# Patient Record
Sex: Female | Born: 1950
Health system: Southern US, Community
[De-identification: ages and names within clinical notes are randomized; demographics above are authoritative.]

## PROBLEM LIST (undated history)

## (undated) DIAGNOSIS — M1990 Unspecified osteoarthritis, unspecified site: Secondary | ICD-10-CM

## (undated) DIAGNOSIS — E559 Vitamin D deficiency, unspecified: Secondary | ICD-10-CM

## (undated) DIAGNOSIS — R51 Headache: Secondary | ICD-10-CM

## (undated) DIAGNOSIS — I739 Peripheral vascular disease, unspecified: Secondary | ICD-10-CM

## (undated) DIAGNOSIS — K219 Gastro-esophageal reflux disease without esophagitis: Secondary | ICD-10-CM

## (undated) DIAGNOSIS — E669 Obesity, unspecified: Secondary | ICD-10-CM

## (undated) DIAGNOSIS — G4730 Sleep apnea, unspecified: Secondary | ICD-10-CM

## (undated) DIAGNOSIS — K5900 Constipation, unspecified: Secondary | ICD-10-CM

## (undated) DIAGNOSIS — I2510 Atherosclerotic heart disease of native coronary artery without angina pectoris: Secondary | ICD-10-CM

## (undated) DIAGNOSIS — E1142 Type 2 diabetes mellitus with diabetic polyneuropathy: Secondary | ICD-10-CM

## (undated) DIAGNOSIS — J449 Chronic obstructive pulmonary disease, unspecified: Secondary | ICD-10-CM

## (undated) DIAGNOSIS — I509 Heart failure, unspecified: Secondary | ICD-10-CM

## (undated) DIAGNOSIS — Z7982 Long term (current) use of aspirin: Secondary | ICD-10-CM

## (undated) DIAGNOSIS — R0602 Shortness of breath: Secondary | ICD-10-CM

## (undated) DIAGNOSIS — E039 Hypothyroidism, unspecified: Secondary | ICD-10-CM

## (undated) DIAGNOSIS — I700 Atherosclerosis of aorta: Secondary | ICD-10-CM

## (undated) DIAGNOSIS — R413 Other amnesia: Secondary | ICD-10-CM

## (undated) DIAGNOSIS — E119 Type 2 diabetes mellitus without complications: Secondary | ICD-10-CM

## (undated) DIAGNOSIS — K589 Irritable bowel syndrome without diarrhea: Secondary | ICD-10-CM

## (undated) DIAGNOSIS — M2550 Pain in unspecified joint: Secondary | ICD-10-CM

## (undated) DIAGNOSIS — F32A Depression, unspecified: Secondary | ICD-10-CM

## (undated) DIAGNOSIS — H9190 Unspecified hearing loss, unspecified ear: Secondary | ICD-10-CM

## (undated) DIAGNOSIS — Z8719 Personal history of other diseases of the digestive system: Secondary | ICD-10-CM

## (undated) DIAGNOSIS — G4700 Insomnia, unspecified: Secondary | ICD-10-CM

## (undated) DIAGNOSIS — D649 Anemia, unspecified: Secondary | ICD-10-CM

## (undated) DIAGNOSIS — T8859XA Other complications of anesthesia, initial encounter: Secondary | ICD-10-CM

## (undated) DIAGNOSIS — I5030 Unspecified diastolic (congestive) heart failure: Secondary | ICD-10-CM

## (undated) DIAGNOSIS — G4733 Obstructive sleep apnea (adult) (pediatric): Secondary | ICD-10-CM

## (undated) DIAGNOSIS — G5600 Carpal tunnel syndrome, unspecified upper limb: Secondary | ICD-10-CM

## (undated) DIAGNOSIS — M545 Low back pain, unspecified: Secondary | ICD-10-CM

## (undated) DIAGNOSIS — R0600 Dyspnea, unspecified: Secondary | ICD-10-CM

## (undated) DIAGNOSIS — M549 Dorsalgia, unspecified: Secondary | ICD-10-CM

## (undated) DIAGNOSIS — Z87442 Personal history of urinary calculi: Secondary | ICD-10-CM

## (undated) DIAGNOSIS — F329 Major depressive disorder, single episode, unspecified: Secondary | ICD-10-CM

## (undated) DIAGNOSIS — I10 Essential (primary) hypertension: Secondary | ICD-10-CM

## (undated) DIAGNOSIS — R519 Headache, unspecified: Secondary | ICD-10-CM

## (undated) DIAGNOSIS — E785 Hyperlipidemia, unspecified: Secondary | ICD-10-CM

## (undated) DIAGNOSIS — L719 Rosacea, unspecified: Secondary | ICD-10-CM

## (undated) DIAGNOSIS — I6783 Posterior reversible encephalopathy syndrome: Secondary | ICD-10-CM

## (undated) DIAGNOSIS — N189 Chronic kidney disease, unspecified: Secondary | ICD-10-CM

## (undated) DIAGNOSIS — M79673 Pain in unspecified foot: Secondary | ICD-10-CM

## (undated) DIAGNOSIS — K449 Diaphragmatic hernia without obstruction or gangrene: Secondary | ICD-10-CM

## (undated) DIAGNOSIS — E2839 Other primary ovarian failure: Secondary | ICD-10-CM

## (undated) DIAGNOSIS — E079 Disorder of thyroid, unspecified: Secondary | ICD-10-CM

## (undated) DIAGNOSIS — I6789 Other cerebrovascular disease: Secondary | ICD-10-CM

## (undated) DIAGNOSIS — E538 Deficiency of other specified B group vitamins: Secondary | ICD-10-CM

## (undated) DIAGNOSIS — J309 Allergic rhinitis, unspecified: Secondary | ICD-10-CM

## (undated) DIAGNOSIS — G609 Hereditary and idiopathic neuropathy, unspecified: Secondary | ICD-10-CM

## (undated) DIAGNOSIS — J189 Pneumonia, unspecified organism: Secondary | ICD-10-CM

## (undated) DIAGNOSIS — E11610 Type 2 diabetes mellitus with diabetic neuropathic arthropathy: Secondary | ICD-10-CM

## (undated) DIAGNOSIS — N184 Chronic kidney disease, stage 4 (severe): Secondary | ICD-10-CM

## (undated) DIAGNOSIS — R609 Edema, unspecified: Secondary | ICD-10-CM

## (undated) HISTORY — DX: Unspecified osteoarthritis, unspecified site: M19.90

## (undated) HISTORY — DX: Anemia, unspecified: D64.9

## (undated) HISTORY — DX: Low back pain, unspecified: M54.50

## (undated) HISTORY — PX: TUBAL LIGATION: SHX77

## (undated) HISTORY — DX: Pneumonia, unspecified organism: J18.9

## (undated) HISTORY — DX: Insomnia, unspecified: G47.00

## (undated) HISTORY — DX: Constipation, unspecified: K59.00

## (undated) HISTORY — DX: Rosacea, unspecified: L71.9

## (undated) HISTORY — PX: FOOT SURGERY: SHX648

## (undated) HISTORY — DX: Hyperlipidemia, unspecified: E78.5

## (undated) HISTORY — DX: Depression, unspecified: F32.A

## (undated) HISTORY — DX: Dyspnea, unspecified: R06.00

## (undated) HISTORY — DX: Sleep apnea, unspecified: G47.30

## (undated) HISTORY — DX: Allergic rhinitis, unspecified: J30.9

## (undated) HISTORY — DX: Essential (primary) hypertension: I10

## (undated) HISTORY — DX: Pain in unspecified joint: M25.50

## (undated) HISTORY — DX: Dorsalgia, unspecified: M54.9

## (undated) HISTORY — DX: Unspecified hearing loss, unspecified ear: H91.90

## (undated) HISTORY — DX: Personal history of other diseases of the digestive system: Z87.19

## (undated) HISTORY — DX: Other amnesia: R41.3

## (undated) HISTORY — DX: Headache, unspecified: R51.9

## (undated) HISTORY — DX: Edema, unspecified: R60.9

## (undated) HISTORY — PX: ANKLE SURGERY: SHX546

## (undated) HISTORY — DX: Vitamin D deficiency, unspecified: E55.9

## (undated) HISTORY — DX: Disorder of thyroid, unspecified: E07.9

## (undated) HISTORY — DX: Carpal tunnel syndrome, unspecified upper limb: G56.00

## (undated) HISTORY — DX: Hereditary and idiopathic neuropathy, unspecified: G60.9

## (undated) HISTORY — DX: Low back pain: M54.5

## (undated) HISTORY — DX: Other primary ovarian failure: E28.39

## (undated) HISTORY — DX: Gastro-esophageal reflux disease without esophagitis: K21.9

## (undated) HISTORY — DX: Shortness of breath: R06.02

## (undated) HISTORY — DX: Obesity, unspecified: E66.9

## (undated) HISTORY — PX: SPINE SURGERY: SHX786

## (undated) HISTORY — DX: Irritable bowel syndrome, unspecified: K58.9

## (undated) HISTORY — DX: Chronic kidney disease, unspecified: N18.9

## (undated) HISTORY — DX: Deficiency of other specified B group vitamins: E53.8

## (undated) HISTORY — DX: Headache: R51

## (undated) HISTORY — DX: Pain in unspecified foot: M79.673

## (undated) HISTORY — PX: NECK SURGERY: SHX720

## (undated) HISTORY — DX: Major depressive disorder, single episode, unspecified: F32.9

---

## 1968-09-11 HISTORY — PX: APPENDECTOMY: SHX54

## 1973-09-11 HISTORY — PX: ABDOMINAL HYSTERECTOMY: SHX81

## 1987-09-12 HISTORY — PX: VAGINAL HYSTERECTOMY: SUR661

## 2004-12-06 ENCOUNTER — Ambulatory Visit: Payer: Self-pay

## 2005-09-14 ENCOUNTER — Ambulatory Visit: Payer: Self-pay

## 2006-02-02 ENCOUNTER — Emergency Department: Payer: Self-pay | Admitting: Emergency Medicine

## 2006-02-08 ENCOUNTER — Emergency Department: Payer: Self-pay | Admitting: Emergency Medicine

## 2007-06-22 ENCOUNTER — Ambulatory Visit: Payer: Self-pay | Admitting: Family Medicine

## 2007-07-26 ENCOUNTER — Ambulatory Visit: Payer: Self-pay | Admitting: Family Medicine

## 2007-11-27 ENCOUNTER — Ambulatory Visit: Payer: Self-pay | Admitting: Family Medicine

## 2008-09-16 DIAGNOSIS — M545 Low back pain, unspecified: Secondary | ICD-10-CM | POA: Insufficient documentation

## 2008-09-22 ENCOUNTER — Ambulatory Visit: Payer: Self-pay | Admitting: Family Medicine

## 2008-10-02 ENCOUNTER — Ambulatory Visit: Payer: Self-pay | Admitting: Family Medicine

## 2009-07-29 ENCOUNTER — Ambulatory Visit: Payer: Self-pay | Admitting: Family Medicine

## 2009-09-15 ENCOUNTER — Ambulatory Visit: Payer: Self-pay | Admitting: Gastroenterology

## 2009-09-15 LAB — HM COLONOSCOPY: HM Colonoscopy: NORMAL

## 2009-10-04 ENCOUNTER — Ambulatory Visit: Payer: Self-pay | Admitting: Family Medicine

## 2010-10-14 ENCOUNTER — Emergency Department: Payer: Self-pay | Admitting: Emergency Medicine

## 2010-10-17 ENCOUNTER — Emergency Department: Payer: Self-pay | Admitting: Emergency Medicine

## 2010-10-21 ENCOUNTER — Emergency Department: Payer: Self-pay | Admitting: Unknown Physician Specialty

## 2010-10-25 LAB — HM DEXA SCAN: HM Dexa Scan: NORMAL

## 2010-10-25 LAB — HM PAP SMEAR: HM Pap smear: NORMAL

## 2010-10-28 ENCOUNTER — Emergency Department: Payer: Self-pay | Admitting: Emergency Medicine

## 2010-12-01 ENCOUNTER — Ambulatory Visit: Payer: Self-pay | Admitting: Family Medicine

## 2011-01-10 ENCOUNTER — Ambulatory Visit: Payer: Self-pay | Admitting: Family Medicine

## 2011-01-10 HISTORY — PX: CATARACT EXTRACTION: SUR2

## 2011-01-27 ENCOUNTER — Ambulatory Visit: Payer: Self-pay | Admitting: Ophthalmology

## 2011-02-14 ENCOUNTER — Ambulatory Visit: Payer: Self-pay | Admitting: Ophthalmology

## 2011-04-19 ENCOUNTER — Ambulatory Visit: Payer: Self-pay | Admitting: Ophthalmology

## 2011-04-25 ENCOUNTER — Ambulatory Visit: Payer: Self-pay | Admitting: Ophthalmology

## 2011-09-12 HISTORY — PX: OTHER SURGICAL HISTORY: SHX169

## 2012-02-06 ENCOUNTER — Ambulatory Visit (INDEPENDENT_AMBULATORY_CARE_PROVIDER_SITE_OTHER): Payer: 59 | Admitting: Cardiovascular Disease

## 2012-02-06 ENCOUNTER — Encounter: Payer: Self-pay | Admitting: Cardiovascular Disease

## 2012-02-06 DIAGNOSIS — R079 Chest pain, unspecified: Secondary | ICD-10-CM

## 2012-02-06 DIAGNOSIS — E785 Hyperlipidemia, unspecified: Secondary | ICD-10-CM

## 2012-02-06 DIAGNOSIS — I10 Essential (primary) hypertension: Secondary | ICD-10-CM

## 2012-02-06 DIAGNOSIS — E782 Mixed hyperlipidemia: Secondary | ICD-10-CM | POA: Insufficient documentation

## 2012-02-06 DIAGNOSIS — E119 Type 2 diabetes mellitus without complications: Secondary | ICD-10-CM

## 2012-02-06 DIAGNOSIS — R0789 Other chest pain: Secondary | ICD-10-CM | POA: Insufficient documentation

## 2012-02-06 NOTE — Progress Notes (Signed)
Summer [name] Date of Birth  [birth year]       [facility] Office [address]  [address], [address] [place], [place]  [address]   [place], [place]  [address] [phone]     [phone]   Fax  [phone]    Fax [phone]  Problem List: 1. Chest pain 2. Diabetes Mellitus 3. Hypertension 4. Hyperlipidemia 5. Asthma   History of Present Illness:  Summer [name] is a 61 yo with the above noted medical problems.  She has very rare episodes of chest pain.  She had an episode last week while she was at her medical doctors office.  It lasted 20 minutes. The pain was described as a very sharp sensation. She also described it as a tightness.  She has had lots of fatigue.  She does not get any regular exercise.  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  Her diabetes has not been well controlled. Her last hemoglobin A1c was 8.8.   Current Outpatient Prescriptions on File Prior to Visit  Medication Sig Dispense Refill  . albuterol (PROVENTIL HFA;VENTOLIN HFA) 108 (90 BASE) MCG/ACT inhaler Inhale 2 puffs into the lungs every 6 (six) hours as needed.      . Cinnamon 500 MG TABS Take 500 mg by mouth daily.      . DULoxetine (CYMBALTA) 60 MG capsule Take 60 mg by mouth daily.      . Insulin Detemir (LEVEMIR FLEXPEN [place]) Inject 35 Units into the skin.       Marland Kitchen losartan-hydrochlorothiazide (HYZAAR) 100-12.5 MG per tablet Take 1 tablet by mouth daily.      . metoprolol (LOPRESSOR) 50 MG tablet Take 50 mg by mouth daily.       . minocycline (DYNACIN) 100 MG tablet Take 100 mg by mouth 2 (two) times daily.      . montelukast (SINGULAIR) 10 MG tablet Take 10 mg by mouth at bedtime.      . Multiple Vitamins-Minerals (CENTRUM CARDIO PO) Take by mouth daily.      . niacin (NIASPAN) 1000 MG CR tablet Take 1,000 mg by mouth at bedtime.      . Omega-3 Fatty Acids (FISH OIL) 1000 MG CAPS  Take 1,000 mg by mouth daily.      Marland Kitchen omeprazole (PRILOSEC) 20 MG capsule Take 20 mg by mouth daily.      . pravastatin (PRAVACHOL) 80 MG tablet Take 80 mg by mouth daily.      . pregabalin (LYRICA) 300 MG capsule Take 300 mg by mouth 2 (two) times daily.      Marland Kitchen VITAMIN D, CHOLECALCIFEROL, PO Take 20,000 Units by mouth daily.        Allergies  Allergen Reactions  . Codeine     Past Medical History  Diagnosis Date  . Diabetes mellitus   . Hypertension   . Chronic kidney disease   . Hyperlipidemia   . Depressive disorder   . Rosacea   . Memory loss   . Unspecified hereditary and idiopathic peripheral neuropathy   . Obesity   . Rhinitis, allergic   . Asthma   . Insomnia   . Unspecified sleep apnea   . Other ovarian failure   . Unspecified hearing loss   . Carpal tunnel syndrome   . Lumbago     Past Surgical  History  Procedure Date  . Tubal ligation   . Vaginal hysterectomy 1989  . Neck surgery   . Cataract extraction 01/2011    right  . Eye lid surgery 2013    bilateral    History  Smoking status  . Never Smoker   Smokeless tobacco  . Not on file    History  Alcohol Use No    Family History  Problem Relation Age of Onset  . Heart attack Mother     Reviw of Systems:  Reviewed in the HPI.  All other systems are negative.  Physical Exam: Blood pressure 98/64, pulse 72, height 5\' 4"  (1.626 m), weight 211 lb (95.709 kg). General: Well developed, well nourished, in no acute distress.  Head: Normocephalic, atraumatic, sclera non-icteric, mucus membranes are moist,   Neck: Supple. Carotids are 2 + without bruits. No JVD  Lungs: Clear bilaterally to auscultation.  Heart: regular rate.  normal  S1 S2. No murmurs, gallops or rubs.  Abdomen: Soft, non-tender, non-distended with normal bowel sounds. No hepatomegaly. No rebound/guarding. No masses.  Msk:  Strength and tone are normal  Extremities: No clubbing or cyanosis. No edema.  Distal pedal pulses are  2+ and equal bilaterally.  Neuro: Alert and oriented X 3. Moves all extremities spontaneously.  Psych:  Responds to questions appropriately with a normal affect.  ECG: Normal sinus rhythm at 72 beats a minute. She has no ST or T wave changes.  Assessment / Plan:

## 2012-02-06 NOTE — Assessment & Plan Note (Signed)
He was presents with episodes of chest discomfort. She states that they happen only very rarely. She hadn't had one episode while she was at the doctor's office. Her cardiac enzymes were basically normal but she did have a CPK MB of 3.8 which was mildly elevated.  The pain was described as a chest pressure.  She does have diabetes, hypertension, and hyperlipidemia.  I would Like to schedule her for a stress Myoview study at [facility]. I've asked her to walk on a regular basis. I've asked her to try the EKG better diet including no fried foods.  I'll see her back in the office in 2 months for followup office visit.

## 2012-02-06 NOTE — Assessment & Plan Note (Signed)
Her blood pressure is very well controlled and in fact is a little low today. I've encouraged her to try to avoid eating any salty or fried foods. She may be able to cut back on some of her antihypertensives if she is able to exercise and lose some weight.

## 2012-02-06 NOTE — Patient Instructions (Signed)
Your physician wants you to follow-up in: 2 months with Dr. Elease Hashimoto.  You will receive a reminder letter in the mail two months in advance. If you don't receive a letter, please call our office to schedule the follow-up appointment.  Your physician has requested that you have a stress myoview to be done at [facility].

## 2012-02-06 NOTE — Assessment & Plan Note (Signed)
She'll continue with Pravachol 80 mg and Niaspan 1 g a day. Her last HDL was 58. LDL was 106. Her total cholesterol is 206. The triglyceride level is 209. Her hemoglobin A1c was 8.8.  I've encouraged her to exercise on a regular basis.

## 2012-02-16 ENCOUNTER — Telehealth: Payer: Self-pay | Admitting: Cardiovascular Disease

## 2012-02-16 ENCOUNTER — Other Ambulatory Visit: Payer: Self-pay

## 2012-02-16 DIAGNOSIS — R079 Chest pain, unspecified: Secondary | ICD-10-CM

## 2012-02-16 NOTE — Telephone Encounter (Signed)
Aurther Loft calling from [facility], pt having stress test  Done 02-19-12, needs order faxed to [phone]

## 2012-02-16 NOTE — Telephone Encounter (Signed)
Called [facility] office and made them aware

## 2012-02-19 ENCOUNTER — Ambulatory Visit: Payer: Self-pay

## 2012-02-19 DIAGNOSIS — R079 Chest pain, unspecified: Secondary | ICD-10-CM

## 2012-02-29 ENCOUNTER — Telehealth: Payer: Self-pay | Admitting: *Deleted

## 2012-02-29 ENCOUNTER — Other Ambulatory Visit: Payer: Self-pay | Admitting: Cardiovascular Disease

## 2012-02-29 DIAGNOSIS — R079 Chest pain, unspecified: Secondary | ICD-10-CM

## 2012-02-29 NOTE — Telephone Encounter (Signed)
Received stress test results from [place], re faxed to [facility] office, Wess Botts RN aware.

## 2012-03-05 ENCOUNTER — Telehealth: Payer: Self-pay | Admitting: *Deleted

## 2012-03-05 NOTE — Telephone Encounter (Signed)
LMTCB

## 2012-03-05 NOTE — Telephone Encounter (Signed)
Message copied by Antony Odea on Tue Mar 05, 2012  3:24 PM ------      Message from: [place], [place]      Created: Mon Mar 04, 2012  6:34 PM       Normal stress test

## 2012-03-06 NOTE — Telephone Encounter (Signed)
Pt informed Understanding verb 

## 2012-04-10 ENCOUNTER — Ambulatory Visit: Payer: 59 | Admitting: Cardiovascular Disease

## 2012-05-21 ENCOUNTER — Ambulatory Visit: Payer: Self-pay | Admitting: Family Medicine

## 2012-06-18 ENCOUNTER — Encounter: Payer: Self-pay | Admitting: Cardiovascular Disease

## 2012-06-18 ENCOUNTER — Ambulatory Visit (INDEPENDENT_AMBULATORY_CARE_PROVIDER_SITE_OTHER): Payer: 59 | Admitting: Cardiovascular Disease

## 2012-06-18 VITALS — BP 138/70 | HR 100 | Ht 64.0 in | Wt 215.2 lb

## 2012-06-18 DIAGNOSIS — R5381 Other malaise: Secondary | ICD-10-CM

## 2012-06-18 DIAGNOSIS — R5383 Other fatigue: Secondary | ICD-10-CM

## 2012-06-18 DIAGNOSIS — R0789 Other chest pain: Secondary | ICD-10-CM

## 2012-06-18 DIAGNOSIS — I10 Essential (primary) hypertension: Secondary | ICD-10-CM

## 2012-06-18 DIAGNOSIS — Z79899 Other long term (current) drug therapy: Secondary | ICD-10-CM

## 2012-06-18 NOTE — Assessment & Plan Note (Signed)
Summer [name] seems to be doing better. She is no longer having any further episodes of chest tightness. At this point I think that she is at low risk of having any significant cardiac vascular disease. We will see her on an as-needed basis.  Her heart rate remains a little fast. She also has has waking period we'll measure a TSH today. We'll call her with those results. I have encouraged her to followup with her general medical [name] asked her to get regular exercise.  Family practice resident, Dr. Roslynn Amble counseled her regarding a good low fat and low carbohydrate diet as well as an exercise program.  We'll see her as needed.

## 2012-06-18 NOTE — Progress Notes (Signed)
Summer [name] Date of Birth  09/29/1950       [facility] Office [address]  [address] [place], [place]  [address]   [place], [place]  [address] [phone]     [phone]   Fax  [phone]    Fax [phone]  Problem List: 1. Chest pain 2. Diabetes Mellitus 3. Hypertension 4. Hyperlipidemia 5. Asthma   History of Present Illness:  Summer [name] is a 61 yo with the above noted medical problems.  She has very rare episodes of chest pain.  She had an episode last week while she was at her medical doctors office.  It lasted 20 minutes. The pain was described as a very sharp sensation. She also described it as a tightness.  She has had lots of fatigue.  She does not get any regular exercise.  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  Her diabetes has not been well controlled. Her last hemoglobin A1c was 8.8. 06/18/2012- She has gained weight since I last saw her.  She is not getting any regular exercise.  She has changed and is drinking more diet soda than regular soda.   Current Outpatient Prescriptions on File Prior to Visit  Medication Sig Dispense Refill  . ARIPiprazole (ABILIFY) 5 MG tablet Take 5 mg by mouth daily.      . Cinnamon 500 MG TABS Take 500 mg by mouth daily.      . diclofenac sodium (VOLTAREN) 1 % GEL Apply topically as needed.      . DULoxetine (CYMBALTA) 60 MG capsule Take 60 mg by mouth daily.      . Insulin Detemir (LEVEMIR FLEXPEN [place]) Inject 35 Units into the skin.       Marland Kitchen losartan-hydrochlorothiazide (HYZAAR) 100-12.5 MG per tablet Take 1 tablet by mouth daily.      . metoprolol (LOPRESSOR) 50 MG tablet Take 50 mg by mouth daily.       . minocycline (DYNACIN) 100 MG tablet Take 100 mg by mouth 2 (two) times daily.      . montelukast (SINGULAIR) 10 MG tablet Take 10 mg by mouth at bedtime.      . Multiple Vitamins-Minerals  (CENTRUM CARDIO PO) Take by mouth daily.      . niacin (NIASPAN) 1000 MG CR tablet Take 1,000 mg by mouth at bedtime.      . Omega-3 Fatty Acids (FISH OIL) 1000 MG CAPS Take 1,000 mg by mouth daily.      . pravastatin (PRAVACHOL) 80 MG tablet Take 80 mg by mouth daily.      . pregabalin (LYRICA) 300 MG capsule Take 300 mg by mouth 2 (two) times daily.      . Saxagliptin-Metformin (KOMBIGLYZE XR) 2.01-999 MG TB24 Take 2.5-1,000 mg by mouth daily.      . traZODone (DESYREL) 50 MG tablet Take 50 mg by mouth at bedtime.      . vitamin B-12 (CYANOCOBALAMIN) 250 MCG tablet Take 250 mcg by mouth daily.      Marland Kitchen VITAMIN D, CHOLECALCIFEROL, PO Take 20,000 Units by mouth daily.        Allergies  Allergen Reactions  . Codeine     Past Medical History  Diagnosis Date  . Diabetes mellitus   . Hypertension   . Chronic kidney disease   . Hyperlipidemia   .  Depressive disorder   . Rosacea   . Memory loss   . Unspecified hereditary and idiopathic peripheral neuropathy   . Obesity   . Rhinitis, allergic   . Asthma   . Insomnia   . Unspecified sleep apnea   . Other ovarian failure   . Unspecified hearing loss   . Carpal tunnel syndrome   . Lumbago     Past Surgical History  Procedure Date  . Tubal ligation   . Vaginal hysterectomy 1989  . Neck surgery   . Cataract extraction 01/2011    right  . Eye lid surgery 2013    bilateral    History  Smoking status  . Never Smoker   Smokeless tobacco  . Not on file    History  Alcohol Use No    Family History  Problem Relation Age of Onset  . Heart attack Mother     Reviw of Systems:  Reviewed in the HPI.  All other systems are negative.  Physical Exam: Blood pressure 138/70, pulse 100, height 5\' 4"  (1.626 m), weight 215 lb 4 oz (97.637 kg). General: Well developed, well nourished, in no acute distress.  Head: Normocephalic, atraumatic, sclera non-icteric, mucus membranes are moist,   Neck: Supple. Carotids are 2 + without  bruits. No JVD  Lungs: Clear bilaterally to auscultation.  Heart: regular rate.  normal  S1 S2. No murmurs, gallops or rubs.  Abdomen: Soft, non-tender, non-distended with normal bowel sounds. No hepatomegaly. No rebound/guarding. No masses.  Msk:  Strength and tone are normal  Extremities: No clubbing or cyanosis. No edema.  Distal pedal pulses are 2+ and equal bilaterally.  Neuro: Alert and oriented X 3. Moves all extremities spontaneously.  Psych:  Responds to questions appropriately with a normal affect.  ECG: Oct. 8, 2013 - Normal sinus rhythm at 79 beats a minute. She has no ST or T wave changes.  Assessment / Plan:

## 2012-06-18 NOTE — Patient Instructions (Addendum)
Labs today: TSH  No Medication changes were made today. Follow up as needed with Dr. Elease Hashimoto. Follow up with primary physician.

## 2012-06-19 LAB — TSH: TSH: 3.62 u[IU]/mL (ref 0.450–4.500)

## 2012-10-26 ENCOUNTER — Other Ambulatory Visit: Payer: Self-pay

## 2013-07-17 ENCOUNTER — Other Ambulatory Visit: Payer: Self-pay

## 2013-08-15 ENCOUNTER — Ambulatory Visit (INDEPENDENT_AMBULATORY_CARE_PROVIDER_SITE_OTHER): Payer: 59 | Admitting: Podiatry

## 2013-08-15 ENCOUNTER — Ambulatory Visit (INDEPENDENT_AMBULATORY_CARE_PROVIDER_SITE_OTHER): Payer: 59

## 2013-08-15 ENCOUNTER — Encounter: Payer: Self-pay | Admitting: Podiatry

## 2013-08-15 VITALS — BP 102/53 | HR 91 | Resp 16 | Ht 64.0 in | Wt 218.0 lb

## 2013-08-15 DIAGNOSIS — M79609 Pain in unspecified limb: Secondary | ICD-10-CM

## 2013-08-15 DIAGNOSIS — L97409 Non-pressure chronic ulcer of unspecified heel and midfoot with unspecified severity: Secondary | ICD-10-CM

## 2013-08-15 DIAGNOSIS — M79672 Pain in left foot: Secondary | ICD-10-CM

## 2013-08-15 DIAGNOSIS — L97421 Non-pressure chronic ulcer of left heel and midfoot limited to breakdown of skin: Secondary | ICD-10-CM

## 2013-08-16 NOTE — Progress Notes (Signed)
Subjective:     Patient ID: Summer [name], female   DOB: September 05, 1951, 62 y.o.   MRN: [id number]  HPI patient points to inside left foot and stated that as she wore her brace it got bruised she did not take it off as she was supposed to and she has developed a small opening on the inside 2 weeks ago that she has been using Neosporin on with no redness or swelling but she wanted me to check it   Review of Systems     Objective:   Physical Exam Neurovascular status intact with no change in diabetic condition or no increase in blood sugars noted. Patient has an approximate 1.5 x 1.5 cm opening on the inside of the left foot that is superficial in nature with no proximal edema erythema drainage or odor noted. No lymph node distention was noted at the current time    Assessment:     Opening of the left foot with superficial ulceration secondary to abrasion that is localized in nature    Plan:     Explained condition the patient and applied Iodosorb and padding to take pressure off this and dispensed pads to reduce pressure against the area with the boot that she is wearing to be continued with reappointment in 2 weeks. If any swelling redness increased pain or drainage were to occur she is to let us know immediately or go to the emergency room for care

## 2013-09-02 ENCOUNTER — Ambulatory Visit (INDEPENDENT_AMBULATORY_CARE_PROVIDER_SITE_OTHER): Payer: 59 | Admitting: Podiatry

## 2013-09-02 ENCOUNTER — Encounter: Payer: Self-pay | Admitting: Podiatry

## 2013-09-02 VITALS — BP 119/64 | HR 106 | Resp 16 | Ht 64.0 in | Wt 208.0 lb

## 2013-09-02 DIAGNOSIS — L97409 Non-pressure chronic ulcer of unspecified heel and midfoot with unspecified severity: Secondary | ICD-10-CM

## 2013-09-02 DIAGNOSIS — L97422 Non-pressure chronic ulcer of left heel and midfoot with fat layer exposed: Secondary | ICD-10-CM

## 2013-09-02 NOTE — Progress Notes (Signed)
Subjective:     Patient ID: Summer [name], female   DOB: Mar 19, 1951, 62 y.o.   MRN: [id number]  HPI patient presents stating it seems a little bit better but still is irritated. Points to the left ankle   Review of Systems     Objective:   Physical Exam The breakdown on medial arch remains about the same size with minimal subcutaneous exposure no erythema edema or drainage surrounding it    Assessment:     Superficial ulceration left arch secondary to severe foot structural deformity    Plan:     Continue with conservative care of Iodosorb soaks and dispensed pads to keep pressure off the area. Reappoint in 3 weeks for review and consideration of wound care referral if not improved

## 2013-09-23 ENCOUNTER — Ambulatory Visit: Payer: 59 | Admitting: Podiatry

## 2013-09-30 ENCOUNTER — Ambulatory Visit (INDEPENDENT_AMBULATORY_CARE_PROVIDER_SITE_OTHER): Payer: 59 | Admitting: Podiatry

## 2013-09-30 ENCOUNTER — Encounter: Payer: Self-pay | Admitting: Podiatry

## 2013-09-30 VITALS — BP 130/73 | HR 100 | Resp 12

## 2013-09-30 DIAGNOSIS — L97409 Non-pressure chronic ulcer of unspecified heel and midfoot with unspecified severity: Secondary | ICD-10-CM

## 2013-09-30 NOTE — Progress Notes (Signed)
Subjective:     Patient ID: Summer [name], female   DOB: 28-Dec-1950, 63 y.o.   MRN: [id number]  HPI patient presents stating I think it is improving a lot. Points to the medial side of the left arch where she had a breakdown of tissue   Review of Systems     Objective:   Physical Exam Neurovascular status intact with area on the left arch which is improving and now measures approximately 5 x 5 mm versus 8 x 8 mm at last visit and it is very superficial with minimal subcutaneous exposure with no erythema edema or drainage surrounding    Assessment:    improved ulceration left arch Plan:     Applied Iodosorb to the area and applied clean dressing with padding. This should heal uneventfully and if there is any issues as far as redness drainage or change patient is to come back and see [place] immediately

## 2013-12-23 ENCOUNTER — Ambulatory Visit: Payer: Self-pay | Admitting: Family Medicine

## 2013-12-23 LAB — HM MAMMOGRAPHY: HM Mammogram: NORMAL

## 2014-03-31 ENCOUNTER — Encounter: Payer: Self-pay | Admitting: Podiatry

## 2014-03-31 ENCOUNTER — Ambulatory Visit (INDEPENDENT_AMBULATORY_CARE_PROVIDER_SITE_OTHER): Payer: Medicare Other | Admitting: Podiatry

## 2014-03-31 DIAGNOSIS — L600 Ingrowing nail: Secondary | ICD-10-CM

## 2014-03-31 DIAGNOSIS — B351 Tinea unguium: Secondary | ICD-10-CM

## 2014-03-31 NOTE — Patient Instructions (Signed)

## 2014-04-02 NOTE — Progress Notes (Signed)
Subjective:     Patient ID: Summer [name], female   DOB: Jan 28, 1951, 63 y.o.   MRN: [id number]  HPI patient presents with a damaged right big toenail and a feeling in her left foot that her circulation has diminished or has changed. She does have severe structural foot deformity left that she would like to look at correcting the future but at this time is concerned mostly about the feeling and then the right toenail   Review of Systems     Objective:   Physical Exam Neurovascular status was unchanged with the area that had broken down the left foot that has healed very well with continued severe foot and ankle deformity left and abnormal pressure points against bone teary at on the right foot the nailbed has been traumatized and the matrix has been loose and in the lateral one third    Assessment:     Traumatized right hallux nail and severely abnormal foot structure left    Plan:     Toe right foot I infiltrated 60 mg Xylocaine Marcaine mixture and remove the hallux nail flush the bed and remove necrotic tissue and applied sterile dressing with instructions on soaks. The left foot she is going to see Dr. Jacqualyn Posey for evaluation and consideration of foot reconstruction at some time during this year

## 2014-04-22 ENCOUNTER — Ambulatory Visit: Payer: Self-pay | Admitting: Family Medicine

## 2014-04-22 LAB — LIPID PANEL
Cholesterol: 233 mg/dL — AB (ref 0–200)
HDL: 44 mg/dL (ref 35–70)
LDL Cholesterol: 126 mg/dL
Triglycerides: 314 mg/dL — AB (ref 40–160)

## 2014-04-22 LAB — HEMOGLOBIN A1C: Hgb A1c MFr Bld: 7.7 % — AB (ref 4.0–6.0)

## 2014-04-29 ENCOUNTER — Encounter: Payer: Self-pay | Admitting: *Deleted

## 2014-05-06 ENCOUNTER — Encounter: Payer: Self-pay | Admitting: Cardiovascular Disease

## 2014-05-06 ENCOUNTER — Ambulatory Visit (INDEPENDENT_AMBULATORY_CARE_PROVIDER_SITE_OTHER): Payer: Medicare Other | Admitting: Cardiovascular Disease

## 2014-05-06 VITALS — BP 127/81 | HR 80 | Ht 64.0 in | Wt 200.0 lb

## 2014-05-06 DIAGNOSIS — R0989 Other specified symptoms and signs involving the circulatory and respiratory systems: Secondary | ICD-10-CM

## 2014-05-06 DIAGNOSIS — R0602 Shortness of breath: Secondary | ICD-10-CM | POA: Insufficient documentation

## 2014-05-06 DIAGNOSIS — R0609 Other forms of dyspnea: Secondary | ICD-10-CM

## 2014-05-06 DIAGNOSIS — R0600 Dyspnea, unspecified: Secondary | ICD-10-CM

## 2014-05-06 NOTE — Assessment & Plan Note (Signed)
She presents today with shortness of breath with exertion. She had no evidence of ischemia on Myoview in 2013 and has not had any episodes of chest pain. Has a history of asthma and I suspect her problems are largely pulmonary. We will get an echocardiogram to evaluate her left ventricular systolic function.    Encouraged her to exercise much as possible. Since I last saw her she's had some orthopedic problems in our left foot is angled sharply laterally. She'll need to have that fixed or have physical therapy.  In addition, she has obstructive sleep apnea but refuses to wear CPAP. I strongly encouraged her to follow up with a pulmonologist. He makes several types of CPAP machines and she should certainly should be able to tolerate 1. Over time, this will lead to pulmonary hypertension. She'll need to see a pulmonologist to address all these issues.  I'll see her back on an as-needed basis.

## 2014-05-06 NOTE — Progress Notes (Signed)
Summer [name] Date of Birth  December 16, 1950       [facility] [address], [place], [place]  [address]   [place],   [address] [phone]     [phone]   Fax  [phone]    Fax [phone]  Problem List: 1. Chest pain 2. Diabetes Mellitus 3. Hypertension 4. Hyperlipidemia 5. Asthma 6. Obstructive sleep apnea   History of Present Illness:  Summer [name] is a 63 yo with the above noted medical problems.  She has very rare episodes of chest pain.  She had an episode last week while she was at her medical doctors office.  It lasted 20 minutes. The pain was described as a very sharp sensation. She also described it as a tightness.  She has had lots of fatigue.  She does not get any regular exercise.  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  Her diabetes has not been well controlled. Her last hemoglobin A1c was 8.8. 06/18/2012- She has gained weight since I last saw her.  She is not getting any regular exercise.  She has changed and is drinking more diet soda than regular soda.  May 06, 2014:  Summer [name] is seen today after 2 year absence.  She's been having some episodes of chest discomfort. A stress Myoview study was normal. Normal left ventricular systolic function at that time.   She presents with shortness breath with exertion.  She has a history of asthma. She also has a lot of diffuse sweating.  She has not been getting any exercise.   She has a history of obstructive sleep apnea but does not use the CPAP machine.    Current Outpatient Prescriptions on File Prior to Visit  Medication Sig Dispense Refill  . albuterol (PROVENTIL HFA;VENTOLIN HFA) 108 (90 BASE) MCG/ACT inhaler Inhale 2 puffs into the lungs every 6 (six) hours as needed for wheezing or shortness of breath.      . Canagliflozin (INVOKANA) 300 MG TABS Take by  mouth daily.      . diclofenac sodium (VOLTAREN) 1 % GEL Apply topically as needed.      . gabapentin (NEURONTIN) 300 MG capsule Take 300 mg by mouth 3 (three) times daily.      . Insulin Detemir (LEVEMIR FLEXPEN [place]) Inject 35 Units into the skin.       Marland Kitchen losartan-hydrochlorothiazide (HYZAAR) 100-12.5 MG per tablet Take 1 tablet by mouth daily.      . metoprolol (LOPRESSOR) 50 MG tablet Take 50 mg by mouth daily.       . montelukast (SINGULAIR) 10 MG tablet Take 10 mg by mouth at bedtime.      Marland Kitchen omeprazole (PRILOSEC) 20 MG capsule Take 20 mg by mouth daily.      . pravastatin (PRAVACHOL) 80 MG tablet Take 80 mg by mouth daily.      . traZODone (DESYREL) 50 MG tablet Take 50 mg by mouth at bedtime.      Marland Kitchen venlafaxine (EFFEXOR) 75 MG tablet Take 75 mg by mouth daily.       Marland Kitchen VITAMIN D, CHOLECALCIFEROL, PO Take 20,000 Units by mouth daily.       No current facility-administered medications on file prior to visit.    Allergies  Allergen Reactions  . Codeine   .  Latex Rash    Past Medical History  Diagnosis Date  . Diabetes mellitus   . Hypertension   . Chronic kidney disease   . Hyperlipidemia   . Depressive disorder   . Rosacea   . Memory loss   . Unspecified hereditary and idiopathic peripheral neuropathy   . Obesity   . Rhinitis, allergic   . Asthma   . Insomnia   . Unspecified sleep apnea   . 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)   . Unspecified hearing loss   . Carpal tunnel syndrome   . Lumbago   . Thyroid disease     Past Surgical History  Procedure Laterality Date  . Tubal ligation    . Vaginal hysterectomy  1989  . Neck surgery    . Cataract extraction  01/2011    right  . Eye lid surgery  2013    bilateral    History  Smoking status  . Never Smoker   Smokeless tobacco  . Never Used    History  Alcohol Use No    Family History  Problem Relation Age of Onset  . Heart attack Mother   . Aneurysm Mother     Reviw of Systems:  Reviewed in the HPI.  All  other systems are negative.  Physical Exam: Blood pressure 127/81, pulse 80, height 5\' 4"  (1.626 m), weight 200 lb (90.719 kg). General: Well developed, well nourished, in no acute distress.  Head: Normocephalic, atraumatic, sclera non-icteric, mucus membranes are moist,   Neck: Supple. Carotids are 2 + without bruits. No JVD  Lungs: Clear bilaterally to auscultation.  Heart: regular rate.  normal  S1 S2. No murmurs, gallops or rubs.  Abdomen: Soft, non-tender, non-distended with normal bowel sounds. No hepatomegaly. No rebound/guarding. No masses.  Msk:  Strength and tone are normal  Extremities: No clubbing or cyanosis. No edema.  Distal pedal pulses are 2+ and equal bilaterally.  Her left foot is angled laterally - difficult for her to point her left foot forward.     Neuro: Alert and oriented X 3. Moves all extremities spontaneously.  Psych:  Responds to questions appropriately with a normal affect.  ECG: May 06, 2014:  NSR at 80, no ST or T wave abn.  Assessment / Plan:

## 2014-05-06 NOTE — Progress Notes (Deleted)
Summer [name] Date of Birth  11/26/1950       Summer [name] [name], [place], [place]  [address]   [place], [place]  [address] [place]   Fax  [phone]     Fax [phone]  Problem List: 1. Diabetes mellitus 2. Chronic kidney disease 3. Hypertension 4.  History of Present Illness:    Current Outpatient Prescriptions on File Prior to Visit  Medication Sig Dispense Refill  . albuterol (PROVENTIL HFA;VENTOLIN HFA) 108 (90 BASE) MCG/ACT inhaler Inhale 2 puffs into the lungs every 6 (six) hours as needed for wheezing or shortness of breath.      . Canagliflozin (INVOKANA) 300 MG TABS Take by mouth daily.      . diclofenac sodium (VOLTAREN) 1 % GEL Apply topically as needed.      . gabapentin (NEURONTIN) 300 MG capsule Take 300 mg by mouth 3 (three) times daily.      . Insulin Detemir (LEVEMIR FLEXPEN [place]) Inject 35 Units into the skin.       Marland Kitchen losartan-hydrochlorothiazide (HYZAAR) 100-12.5 MG per tablet Take 1 tablet by mouth daily.      . metoprolol (LOPRESSOR) 50 MG tablet Take 50 mg by mouth daily.       . montelukast (SINGULAIR) 10 MG tablet Take 10 mg by mouth at bedtime.      Marland Kitchen omeprazole (PRILOSEC) 20 MG capsule Take 20 mg by mouth daily.      . pravastatin (PRAVACHOL) 80 MG tablet Take 80 mg by mouth daily.      . traZODone (DESYREL) 50 MG tablet Take 50 mg by mouth at bedtime.      Marland Kitchen venlafaxine (EFFEXOR) 75 MG tablet Take 75 mg by mouth daily.       Marland Kitchen VITAMIN D, CHOLECALCIFEROL, PO Take 20,000 Units by mouth daily.       No current facility-administered medications on file prior to visit.    Allergies  Allergen Reactions  . Codeine   . Latex Rash    Past Medical History  Diagnosis Date  . Diabetes mellitus   . Hypertension   . Chronic kidney disease   . Hyperlipidemia   . Depressive disorder   . Rosacea   . Memory loss   . Unspecified hereditary and  idiopathic peripheral neuropathy   . Obesity   . Rhinitis, allergic   . Asthma   . Insomnia   . Unspecified sleep apnea   . 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)   . Unspecified hearing loss   . Carpal tunnel syndrome   . Lumbago   . Thyroid disease     Past Surgical History  Procedure Laterality Date  . Tubal ligation    . Vaginal hysterectomy  1989  . Neck surgery    . Cataract extraction  01/2011    right  . Eye lid surgery  2013    bilateral    History  Smoking status  . Never Smoker   Smokeless tobacco  . Never Used    History  Alcohol Use No    Family History  Problem Relation Age of Onset  . Heart attack Mother   . Aneurysm Mother     Reviw of Systems:  Reviewed in the HPI.  All other systems are negative.  Physical Exam: Blood pressure 127/81, pulse 80, height 5\' 4"  (1.626 m), weight 200 lb (90.719  kg). Wt Readings from Last 3 Encounters:  05/06/14 200 lb (90.719 kg)  09/02/13 208 lb (94.348 kg)  08/15/13 218 lb (98.884 kg)     General: Well developed, well nourished, in no acute distress.  Head: Normocephalic, atraumatic, sclera non-icteric, mucus membranes are moist,   Neck: Supple. Carotids are 2 + without bruits. No JVD ***  Lungs: Clear ***  Heart: ***  Abdomen: Soft, non-tender, non-distended with normal bowel sounds.***  Msk:  Strength and tone are normal ***  Extremities: No clubbing or cyanosis. No edema.  Distal pedal pulses are 2+ and equal  ***  Neuro: CN II - XII intact.  Alert and oriented X 3. ***  Psych:  Normal ***  ECG: May 06, 2014:   NSR at 80, no ST or T wave changes.   Assessment / Plan:

## 2014-05-06 NOTE — Patient Instructions (Signed)
Your physician has requested that you have an echocardiogram. Echocardiography is a painless test that uses sound waves to create images of your heart. It provides your doctor with information about the size and shape of your heart and how well your heart's chambers and valves are working. This procedure takes approximately one hour. There are no restrictions for this procedure.  Your physician recommends that you schedule a follow-up appointment in:  As needed   

## 2014-05-12 ENCOUNTER — Other Ambulatory Visit (INDEPENDENT_AMBULATORY_CARE_PROVIDER_SITE_OTHER): Payer: Medicare Other

## 2014-05-12 ENCOUNTER — Other Ambulatory Visit: Payer: Self-pay

## 2014-05-12 DIAGNOSIS — R0602 Shortness of breath: Secondary | ICD-10-CM

## 2014-05-14 ENCOUNTER — Encounter: Payer: Self-pay | Admitting: Pulmonary Disease

## 2014-05-14 ENCOUNTER — Institutional Professional Consult (permissible substitution): Payer: Medicare Other | Admitting: Internal Medicine

## 2014-05-14 ENCOUNTER — Ambulatory Visit (INDEPENDENT_AMBULATORY_CARE_PROVIDER_SITE_OTHER): Payer: Medicare Other | Admitting: Pulmonary Disease

## 2014-05-14 ENCOUNTER — Telehealth: Payer: Self-pay | Admitting: Pulmonary Disease

## 2014-05-14 VITALS — BP 132/68 | HR 79 | Ht 64.0 in | Wt 202.0 lb

## 2014-05-14 DIAGNOSIS — R0600 Dyspnea, unspecified: Secondary | ICD-10-CM

## 2014-05-14 DIAGNOSIS — R0609 Other forms of dyspnea: Secondary | ICD-10-CM

## 2014-05-14 DIAGNOSIS — R0989 Other specified symptoms and signs involving the circulatory and respiratory systems: Secondary | ICD-10-CM

## 2014-05-14 DIAGNOSIS — E039 Hypothyroidism, unspecified: Secondary | ICD-10-CM

## 2014-05-14 DIAGNOSIS — G4733 Obstructive sleep apnea (adult) (pediatric): Secondary | ICD-10-CM

## 2014-05-14 NOTE — Telephone Encounter (Signed)
Records request has been sent over to correct facility.  Nothing further needed.

## 2014-05-14 NOTE — Patient Instructions (Addendum)
We will request a copy of your sleep study from [facility] We will will also arrange for full pulmonary function testing at [facility] and call you with the results Keep taking the thyroid medicine Stay as active as possible and try to exercise regularly We will request records from your PCP for blood work We will see you back in 4-6 weeks or sooner if needed

## 2014-05-14 NOTE — Assessment & Plan Note (Signed)
I do not see clear evidence of lung disease on her chest x-ray, her lungs are normal on exam today, and her ambulatory oximetry is normal as well. So there is really no clear evidence of lung disease. I explained to her that the differential diagnosis of shortness of breath is very broad and includes pulmonary disease, cardiac disease, hematologic disease, and neurologic disease. She had a recent cardiac workup which was normal. I do not have records of a normal CBC. She does not appear to have neurologic weakness on physical exam today.  To complete the workup for lung disease I will obtain a full pulmonary function test.  I explained to her that I think that shortness of breath is likely due to general fatigue from untreated obstructive sleep apnea as well as hypothyroidism.  Plan: -Obtain full pulmonary function test -Continue thyroid treatment -Start CPAP, we will need to obtain records of her polysomnogram before this

## 2014-05-14 NOTE — Assessment & Plan Note (Signed)
Continue Synthroid started by her primary care physician

## 2014-05-14 NOTE — Progress Notes (Signed)
Subjective:    Patient ID: Summer [name], female    DOB: Jul 24, 1951, 63 y.o.   MRN: [id number]  HPI  Summer [name] is here to see me for shortness of breath on exertion.  She has been experiencing this for only two months now.  She notices it when she is walking around carrying things at work.  She has been to a cardiologist who did an echo that was normal.  She has been told that her heart was OK.  She has a history of asthma after a breathing test ordered for dyspnea.  Seh did not have chest tightness or wheezing.  This was associated with sinus congestion.  She has had some allergies over the years.  She has never smoked.  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  She has been coughing in the last week or so.  Clear sputum, no hemoptysis.  No fever or chills.    She was recently diagnosed with hypothyroidism and was just started on Synthroid about a week ago. Invokana is a new medicine for her as well.  She tried a full face mask CPAP machine in the past for diagnosed obstructive sleep apnea. However, she said that this made her quite claustrophobic. She never tried a nasal pillow device.  Past Medical History  Diagnosis Date  . Diabetes mellitus   . Hypertension   . Chronic kidney disease   . Hyperlipidemia   . Depressive disorder   . Rosacea   . Memory loss   . Unspecified hereditary and idiopathic peripheral neuropathy   . Obesity   . Rhinitis, allergic   . Asthma   . Insomnia   . Unspecified sleep apnea   . 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)   . Unspecified hearing loss   . Carpal tunnel syndrome   . Lumbago   . Thyroid disease      Family History  Problem Relation Age of Onset  . Heart attack Mother   . Aneurysm Mother      History   Social History  . Marital Status: Widowed    Spouse Name: N/A    Number of Children: N/A  . Years of Education: N/A   Occupational History  . Not on file.   Social History Main Topics  .  Smoking status: Never Smoker   . Smokeless tobacco: Never Used  . Alcohol Use: No  . Drug Use: No  . Sexual Activity: Not on file   Other Topics Concern  . Not on file   Social History Narrative  . No narrative on file     Allergies  Allergen Reactions  . Codeine   . Latex Rash     Outpatient Prescriptions Prior to Visit  Medication Sig Dispense Refill  . albuterol (PROVENTIL HFA;VENTOLIN HFA) 108 (90 BASE) MCG/ACT inhaler Inhale 2 puffs into the lungs every 6 (six) hours as needed for wheezing or shortness of breath.      Marland Kitchen aspirin 81 MG tablet Take 81 mg by mouth daily.      . Canagliflozin (INVOKANA) 300 MG TABS Take by mouth daily.      . Cyanocobalamin 2500 MCG CHEW Chew by mouth daily.      . diclofenac sodium (VOLTAREN) 1 % GEL Apply topically as needed.      . gabapentin (NEURONTIN) 300 MG capsule Take 300 mg by mouth 3 (three) times daily.      . Insulin Detemir (LEVEMIR  FLEXPEN [place]) Inject 35 Units into the skin.       . Insulin Pen Needle (RELION PEN NEEDLE 31G/8MM) 31G X 8 MM MISC by Does not apply route 2 (two) times daily.      Marland Kitchen levothyroxine (SYNTHROID, LEVOTHROID) 25 MCG tablet Take 25 mcg by mouth daily before breakfast.      . losartan-hydrochlorothiazide (HYZAAR) 100-12.5 MG per tablet Take 1 tablet by mouth daily.      . metoprolol (LOPRESSOR) 50 MG tablet Take 50 mg by mouth daily.       . montelukast (SINGULAIR) 10 MG tablet Take 10 mg by mouth at bedtime.      Marland Kitchen omeprazole (PRILOSEC) 20 MG capsule Take 20 mg by mouth daily.      . pravastatin (PRAVACHOL) 80 MG tablet Take 80 mg by mouth daily.      . ranitidine (ZANTAC) 150 MG tablet Take 150 mg by mouth at bedtime.      . traZODone (DESYREL) 50 MG tablet Take 50 mg by mouth at bedtime.      Marland Kitchen venlafaxine (EFFEXOR) 75 MG tablet Take 75 mg by mouth daily.       Marland Kitchen VITAMIN D, CHOLECALCIFEROL, PO Take 20,000 Units by mouth daily.       No facility-administered medications prior to visit.      Review of  Systems  Constitutional: Negative for fever and unexpected weight change.  HENT: Positive for congestion. Negative for dental problem, ear pain, nosebleeds, postnasal drip, rhinorrhea, sinus pressure, sneezing, sore throat and trouble swallowing.   Eyes: Negative for redness and itching.  Respiratory: Positive for cough and shortness of breath. Negative for chest tightness and wheezing.   Cardiovascular: Negative for palpitations and leg swelling.  Gastrointestinal: Negative for nausea and vomiting.  Genitourinary: Negative for dysuria.  Musculoskeletal: Negative for joint swelling.  Skin: Negative for rash.  Neurological: Negative for headaches.  Hematological: Does not bruise/bleed easily.  Psychiatric/Behavioral: Negative for dysphoric mood. The patient is not nervous/anxious.        Objective:   Physical Exam   Filed Vitals:   05/14/14 1051  BP: 132/68  Pulse: 79  Height: 5\' 4"  (1.626 m)  Weight: 202 lb (91.627 kg)  SpO2: 98%   Gen: obese but well appearing, no acute distress HEENT: NCAT, PERRL, EOMi, OP clear, neck supple without masses PULM: CTA B CV: RRR, no mgr, no JVD AB: BS+, soft, nontender, no hsm Ext: warm, no edema, no clubbing, no cyanosis; R foot in ankle brace, laterally rotated Derm: no rash or skin breakdown Neuro: A&Ox4, CN II-XII intact, strength 5/5 in all 4 extremities      Assessment & Plan:   Dyspnea I do not see clear evidence of lung disease on her chest x-ray, her lungs are normal on exam today, and her ambulatory oximetry is normal as well. So there is really no clear evidence of lung disease. I explained to her that the differential diagnosis of shortness of breath is very broad and includes pulmonary disease, cardiac disease, hematologic disease, and neurologic disease. She had a recent cardiac workup which was normal. I do not have records of a normal CBC. She does not appear to have neurologic weakness on physical exam today.  To complete  the workup for lung disease I will obtain a full pulmonary function test.  I explained to her that I think that shortness of breath is likely due to general fatigue from untreated obstructive sleep apnea as well as  hypothyroidism.  Plan: -Obtain full pulmonary function test -Continue thyroid treatment -Start CPAP, we will need to obtain records of her polysomnogram before this  Unspecified hypothyroidism Continue Synthroid started by her primary care physician  Obstructive sleep apnea She is quite symptomatic from this. We need to obtain records of her polysomnogram and then start her on CPAP.   Updated Medication List Outpatient Encounter Prescriptions as of 05/14/2014  Medication Sig  . albuterol (PROVENTIL HFA;VENTOLIN HFA) 108 (90 BASE) MCG/ACT inhaler Inhale 2 puffs into the lungs every 6 (six) hours as needed for wheezing or shortness of breath.  Marland Kitchen aspirin 81 MG tablet Take 81 mg by mouth daily.  . Canagliflozin (INVOKANA) 300 MG TABS Take by mouth daily.  . Cyanocobalamin 2500 MCG CHEW Chew by mouth daily.  . diclofenac sodium (VOLTAREN) 1 % GEL Apply topically as needed.  . gabapentin (NEURONTIN) 300 MG capsule Take 300 mg by mouth 3 (three) times daily.  . Insulin Detemir (LEVEMIR FLEXPEN [place]) Inject 35 Units into the skin.   . Insulin Pen Needle (RELION PEN NEEDLE 31G/8MM) 31G X 8 MM MISC by Does not apply route 2 (two) times daily.  Marland Kitchen levothyroxine (SYNTHROID, LEVOTHROID) 25 MCG tablet Take 25 mcg by mouth daily before breakfast.  . losartan-hydrochlorothiazide (HYZAAR) 100-12.5 MG per tablet Take 1 tablet by mouth daily.  . metoprolol (LOPRESSOR) 50 MG tablet Take 50 mg by mouth daily.   . montelukast (SINGULAIR) 10 MG tablet Take 10 mg by mouth at bedtime.  Marland Kitchen omeprazole (PRILOSEC) 20 MG capsule Take 20 mg by mouth daily.  . pravastatin (PRAVACHOL) 80 MG tablet Take 80 mg by mouth daily.  . ranitidine (ZANTAC) 150 MG tablet Take 150 mg by mouth at bedtime.  . traZODone  (DESYREL) 50 MG tablet Take 50 mg by mouth at bedtime.  Marland Kitchen venlafaxine (EFFEXOR) 75 MG tablet Take 75 mg by mouth daily.   Marland Kitchen VITAMIN D, CHOLECALCIFEROL, PO Take 20,000 Units by mouth daily.

## 2014-05-14 NOTE — Assessment & Plan Note (Signed)
She is quite symptomatic from this. We need to obtain records of her polysomnogram and then start her on CPAP.

## 2014-05-21 ENCOUNTER — Ambulatory Visit: Payer: Self-pay | Admitting: Pulmonary Disease

## 2014-05-21 ENCOUNTER — Encounter: Payer: Self-pay | Admitting: Pulmonary Disease

## 2014-05-21 ENCOUNTER — Telehealth: Payer: Self-pay

## 2014-05-21 DIAGNOSIS — R0683 Snoring: Secondary | ICD-10-CM

## 2014-05-21 NOTE — Telephone Encounter (Signed)
Message copied by Kamaury Cutbirth L on Thu May 21, 2014 11:00 AM ------      Message from: Simonne Maffucci B      Created: Thu May 21, 2014  2:09 AM       A,      Please let her know that her 2008 sleep study did NOT show sleep apnea, so I can't prescribe CPAP.            However, given her symptoms, she should have another polysomnogram to look for sleep apnea.            Please order this in [place] with Dr. Gwenette Greet.            Thanks      B ------

## 2014-05-21 NOTE — Telephone Encounter (Signed)
Spoke with pt, she is aware of results and recs.  Order for sleep study sent.  ROV made.  Nothing further needed at this time.

## 2014-05-26 ENCOUNTER — Encounter: Payer: Self-pay | Admitting: Pulmonary Disease

## 2014-06-02 ENCOUNTER — Ambulatory Visit (INDEPENDENT_AMBULATORY_CARE_PROVIDER_SITE_OTHER): Payer: Medicare Other | Admitting: Podiatry

## 2014-06-02 VITALS — BP 112/62 | HR 68 | Resp 16

## 2014-06-02 DIAGNOSIS — E11610 Type 2 diabetes mellitus with diabetic neuropathic arthropathy: Secondary | ICD-10-CM

## 2014-06-02 DIAGNOSIS — E1149 Type 2 diabetes mellitus with other diabetic neurological complication: Secondary | ICD-10-CM

## 2014-06-02 NOTE — Patient Instructions (Signed)
If you want to do some reading this is what we talked about...Marland KitchenMarland KitchenCharcot foot (not Charcot-Marie-Tooth disease)

## 2014-06-02 NOTE — Progress Notes (Signed)
Patient ID: [name], female   DOB: 1950-10-09, 63 y.o.   MRN: [id number]  Subjective: Ms. Wahba, 63 year old female, returns the office surgical evaluation of left foot deformity. He states approximately 1.5 years ago she had an injury to her ankle. Over the course of what appears to be years she has noticed progressive deformity to her left foot. She also has a history of ulceration on the plantar medial aspect of the left foot which has since healed. She is diabetic and typically her blood sugars ranged from approximately 80-100 however she states that he has been up to approximately 250 she started a new thyroid medication. She denies any recent ulceration or her that previous ulceration reopening. 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 No other complaints at this time.  Objective: AAO x3, NAD DP/PT pulses palpable b/l, CRT < 3sec Decreased protective sensation with Simms Weinstein monofilament, decreased vibratory sensation. Left foot with rigid flatfoot deformity on the left foot with prominent talar head on the plantar medial aspect of the foot. +equnius. Range of motion of the ankle joint intact however limited in the subtalar joint as well as midfoot joints. No increase in warmth. Mild chronic edema over the lateral foot/ankle. No tenderness on palpation or pain the vibratory sensation.  Scar on the plantar medial aspect of the left mid foot over the site of prior ulceration. Currently no open lesions. Right foot with overall rectus foot type.  Leg pain, swelling, warmth.  Assessment: 63 year old female with left foot acquired deformity, appearance of a Charcot foot.  Plan: -Previous x-rays were reviewed with the patient. -Conservative versus surgical treatment were discussed the patient in great detail. -Chest various surgical options with the patient. Discussed a reconstructive procedure to help elevate any increased pressure sites and help reconstruct her foot. Discussed that this  would likely involve multiple fusions of her midfoot/rearfoot however definitive procedure would have to be based after CT scan or advanced imaging. Discussed that likely could be done with internal fixation however discussed external fixation as well. Discussed the postoperative course. States that she is unable to take that much time off of work. Discussed that if the wound were toe we occur or there is a high pressure area can perform an exostectomy to help alleviate the pressure sites. -Also discussed that if the patient does not want to proceed with surgical intervention this time she would benefit from a custom molded shoe/insert to help accommodate her foot type given the deformity as well as history of ulceration. At today's appointment she was measured for diabetic inserts and foam counts were obtained of her foot in order to better offload the prominent area. Will send the paperwork for pre-authorization.  -In the meantime discussed that if this is a true Charcot signs and symptoms to be a look out for if this were to become in the acute phase again. Also discussed the importance of daily foot inspection. Recommended 3 month followup evaluations or sooner if there are any changes. -Followup once diabetic inserts for shoes arrive.

## 2014-06-09 ENCOUNTER — Ambulatory Visit: Payer: Self-pay | Admitting: Pulmonary Disease

## 2014-06-15 LAB — HM DIABETES EYE EXAM

## 2014-06-19 ENCOUNTER — Telehealth: Payer: Self-pay | Admitting: Pulmonary Disease

## 2014-06-19 NOTE — Telephone Encounter (Signed)
Pt returned call.  Holly D Pryor ° °

## 2014-06-19 NOTE — Telephone Encounter (Signed)
lmtcb for pt.  

## 2014-06-19 NOTE — Telephone Encounter (Signed)
lmomtcb x 2  

## 2014-06-22 ENCOUNTER — Encounter: Payer: Self-pay | Admitting: Pulmonary Disease

## 2014-06-22 NOTE — Telephone Encounter (Signed)
Summer [name] is there anywhere to work this pt in to see BQ?  She accidentally cancelled her appt with BQ.  thanks

## 2014-06-22 NOTE — Telephone Encounter (Signed)
See if she can come in tomorrow at 8:45.  We are really limited because BQ has to leave early tomorrow for a long lunch and leaving early on Wednesday.  If she can't come in tomorrow at 8:45, we really don't have any other places available unless she comes to next available in [place].

## 2014-06-22 NOTE — Telephone Encounter (Signed)
Summer [name], the 9:30 appt tomorrow is no longer available.  Please advise.  Thank you.

## 2014-06-22 NOTE — Telephone Encounter (Signed)
9/30 tomorrow is our only available appt time.

## 2014-06-22 NOTE — Telephone Encounter (Signed)
Called pt. She scheduled appt to see BQ at 8:45 AM tomorrow in [place]. Will forward to [place] as an [place]

## 2014-06-23 ENCOUNTER — Ambulatory Visit: Payer: Medicare Other | Admitting: Pulmonary Disease

## 2014-06-23 ENCOUNTER — Encounter: Payer: Self-pay | Admitting: Pulmonary Disease

## 2014-06-23 ENCOUNTER — Ambulatory Visit (INDEPENDENT_AMBULATORY_CARE_PROVIDER_SITE_OTHER): Payer: Medicare Other | Admitting: Pulmonary Disease

## 2014-06-23 VITALS — BP 116/68 | HR 75 | Ht 64.0 in | Wt 203.0 lb

## 2014-06-23 DIAGNOSIS — R0683 Snoring: Secondary | ICD-10-CM

## 2014-06-23 DIAGNOSIS — E039 Hypothyroidism, unspecified: Secondary | ICD-10-CM

## 2014-06-23 DIAGNOSIS — R0600 Dyspnea, unspecified: Secondary | ICD-10-CM

## 2014-06-23 NOTE — Patient Instructions (Signed)
You will be started on CPAP this week We will get another pulmonary function test in a year We will see you back in December 2015

## 2014-06-23 NOTE — Assessment & Plan Note (Signed)
She has mild obstructive sleep apnea when you look at her AHI over the course of the evening, but interestingly it is much worse during REM sleep. It is also worse when she is supine. Overall her fatigue has been improving since she started treatment for hypothyroidism.  Plan: -Start CPAP -Advised to try sleep on her side rather than on her back -Followup 2 months

## 2014-06-23 NOTE — Assessment & Plan Note (Signed)
This problem appears to have resolved. The only abnormality seen on her pulmonary function testing was a moderate reduction in the DLCO. Her chest x-ray does not show evidence of interstitial lung disease and her echocardiogram did not show evidence of pulmonary hypertension. The decreased DLCO maybe related to mild anemia.  At this point I do not see evidence of lung disease. I would like to monitor this reduction in DLCO with a repeat pulmonary function test in one year.

## 2014-06-23 NOTE — Progress Notes (Signed)
Subjective:    Patient ID: Summer [name], female    DOB: 03-11-1951, 63 y.o.   MRN: [id number]  Synopsis: Saw Pulmonary in 2015 for fatigue and dyspnea.  This problem significantly improved with treatment for hypothyroidism. She was found to have obstructive sleep apnea with an AHI of 11.7 throughout the course of the evening, but much worse during REM sleep (AHI of 58). CPAP was prescribed in October 2015.  HPI  06/23/2014 ROV > [place] says that she is doing a lot better since the last visit.  She was found to have hypothyroidism and now is on medication for that which is helping.  She feels much better with more energy.  She has better blood sugar control now.  She is eager to start the CPAP machine.   Past Medical History  Diagnosis Date  . Diabetes mellitus   . Hypertension   . Chronic kidney disease   . Hyperlipidemia   . Depressive disorder   . Rosacea   . Memory loss   . Unspecified hereditary and idiopathic peripheral neuropathy   . Obesity   . Rhinitis, allergic   . Asthma   . Insomnia   . Unspecified sleep apnea   . 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)   . Unspecified hearing loss   . Carpal tunnel syndrome   . Lumbago   . Thyroid disease      Review of Systems     Objective:   Physical Exam Filed Vitals:   06/23/14 0852  BP: 116/68  Pulse: 75  Height: 5\' 4"  (1.626 m)  Weight: 203 lb (92.08 kg)  SpO2: 98%  RA  Gen: well appearing, no acute distress HEENT: NCAT,  EOMi, OP clear,  PULM: CTA B CV: RRR, no mgr, no JVD AB: BS+, soft, nontender, no hsm Ext: warm, no edema, no clubbing, no cyanosis Derm: no rash or skin breakdown Neuro: A&Ox4,MAEW        Assessment & Plan:   Hypothyroidism This problem has improved significantly now that she is on treatment.  Obstructive sleep apnea She has mild obstructive sleep apnea when you look at her AHI over the course of the evening, but interestingly it is much worse during REM sleep. It is also worse when she is  supine. Overall her fatigue has been improving since she started treatment for hypothyroidism.  Plan: -Start CPAP -Advised to try sleep on her side rather than on her back -Followup 2 months  Dyspnea This problem appears to have resolved. The only abnormality seen on her pulmonary function testing was a moderate reduction in the DLCO. Her chest x-ray does not show evidence of interstitial lung disease and her echocardiogram did not show evidence of pulmonary hypertension. The decreased DLCO maybe related to mild anemia.  At this point I do not see evidence of lung disease. I would like to monitor this reduction in DLCO with a repeat pulmonary function test in one year.    Updated Medication List Outpatient Encounter Prescriptions as of 06/23/2014  Medication Sig  . albuterol (PROVENTIL HFA;VENTOLIN HFA) 108 (90 BASE) MCG/ACT inhaler Inhale 2 puffs into the lungs every 6 (six) hours as needed for wheezing or shortness of breath.  Marland Kitchen aspirin 81 MG tablet Take 81 mg by mouth daily.  . Canagliflozin-Metformin HCl (INVOKAMET) 150-500 MG TABS Take 1 tablet by mouth 2 (two) times daily.  . Cyanocobalamin 2500 MCG CHEW Chew by mouth daily.  . diclofenac sodium (VOLTAREN) 1 % GEL Apply topically as needed.  Marland Kitchen  gabapentin (NEURONTIN) 300 MG capsule Take 300 mg by mouth 3 (three) times daily.  . Insulin Detemir (LEVEMIR FLEXPEN [place]) Inject 60 Units into the skin.   . Insulin Pen Needle (RELION PEN NEEDLE 31G/8MM) 31G X 8 MM MISC by Does not apply route 2 (two) times daily.  Marland Kitchen levothyroxine (SYNTHROID, LEVOTHROID) 25 MCG tablet Take 25 mcg by mouth daily before breakfast.  . losartan-hydrochlorothiazide (HYZAAR) 100-12.5 MG per tablet Take 1 tablet by mouth daily.  . metoprolol (LOPRESSOR) 50 MG tablet Take 50 mg by mouth daily.   . montelukast (SINGULAIR) 10 MG tablet Take 10 mg by mouth at bedtime.  Marland Kitchen omeprazole (PRILOSEC) 20 MG capsule Take 20 mg by mouth daily.  . pravastatin (PRAVACHOL) 80 MG  tablet Take 80 mg by mouth daily.  . ranitidine (ZANTAC) 150 MG tablet Take 150 mg by mouth at bedtime.  . traZODone (DESYREL) 50 MG tablet Take 50 mg by mouth at bedtime.  Marland Kitchen VITAMIN D, CHOLECALCIFEROL, PO Take 20,000 Units by mouth daily.  Marland Kitchen venlafaxine (EFFEXOR) 75 MG tablet Take 75 mg by mouth daily.   . [DISCONTINUED] Canagliflozin (INVOKANA) 300 MG TABS Take by mouth daily.

## 2014-06-23 NOTE — Assessment & Plan Note (Signed)
This problem has improved significantly now that she is on treatment.

## 2014-08-11 ENCOUNTER — Ambulatory Visit (INDEPENDENT_AMBULATORY_CARE_PROVIDER_SITE_OTHER): Payer: Medicare Other | Admitting: Podiatry

## 2014-08-11 VITALS — BP 114/62 | HR 91 | Resp 16

## 2014-08-11 DIAGNOSIS — E11610 Type 2 diabetes mellitus with diabetic neuropathic arthropathy: Secondary | ICD-10-CM
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
## 2014-08-24 ENCOUNTER — Ambulatory Visit (INDEPENDENT_AMBULATORY_CARE_PROVIDER_SITE_OTHER): Payer: Medicare Other | Admitting: Pulmonary Disease

## 2014-08-24 ENCOUNTER — Encounter: Payer: Self-pay | Admitting: Pulmonary Disease

## 2014-08-24 VITALS — BP 128/66 | HR 96 | Temp 98.6°F | Ht 64.0 in | Wt 204.0 lb

## 2014-08-24 DIAGNOSIS — G4733 Obstructive sleep apnea (adult) (pediatric): Secondary | ICD-10-CM

## 2014-08-24 DIAGNOSIS — R059 Cough, unspecified: Secondary | ICD-10-CM

## 2014-08-24 DIAGNOSIS — R05 Cough: Secondary | ICD-10-CM

## 2014-08-24 NOTE — Assessment & Plan Note (Signed)
She continues to struggle with some insomnia. Today we reviewed sleep hygiene extensively. It sounds like she drinks a fair amount of caffeine in the form of diet soda. I advised her to cut out all caffeine. We also discussed the importance of keeping her room dark in the evenings with no distractions.  Plan: -Follow sleep hygiene recommendations -Stop all caffeine -Request a download from CPAP machine -Continue CPAP -Advised not to drive while sleepy

## 2014-08-24 NOTE — Progress Notes (Signed)
Subjective:    Patient ID: Summer [name], female    DOB: Nov 15, 1950, 63 y.o.   MRN: [id number]  Synopsis: Saw Pulmonary in 2015 for fatigue and dyspnea.  This problem significantly improved with treatment for hypothyroidism. She was found to have obstructive sleep apnea with an AHI of 11.7 throughout the course of the evening, but much worse during REM sleep (AHI of 58). CPAP was prescribed in October 2015.  HPI  Chief Complaint  Patient presents with  . Follow-up    Pt c/o nonprod cough worsened when she eats/drinks, or if there is a sudden change in temp.  Spoke to ENT doc about this, states nothing was wrong with her throat.    Summer [name] has been doing okay since the last visit. Her energy level and shortness of breath have not been too much of a problem since being treated for hypothyroidism. However, she has been having some difficulty with the CPAP mask. Specifically, she says that she wakes up frequently throughout the course of the night and has a hard time going back to sleep. She does not have a specific problem with a past causing her to be uncomfortable, she says she is just not used to it. During the daytime she has noticed a cough on occasion when she eats. She says that it really only been eating. She does not feel like she is choking. She does not have problems swallowing. She describes "an irritation in my throat" whenever she swallows. This is typically worse when drinking cold beverages. She does not have sinus congestion at the time. This is typically not a problem after meals, only during meals.  Past Medical History  Diagnosis Date  . Diabetes mellitus   . Hypertension   . Chronic kidney disease   . Hyperlipidemia   . Depressive disorder   . Rosacea   . Memory loss   . Unspecified hereditary and idiopathic peripheral neuropathy   . Obesity   . Rhinitis, allergic   . Asthma   . Insomnia   . Unspecified sleep apnea   . 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)   . Unspecified  hearing loss   . Carpal tunnel syndrome   . Lumbago   . Thyroid disease      Review of Systems  Constitutional: Positive for fatigue. Negative for fever and chills.  HENT: Negative for nosebleeds, postnasal drip and rhinorrhea.   Respiratory: Positive for cough. Negative for choking, shortness of breath and wheezing.   Cardiovascular: Negative for chest pain, palpitations and leg swelling.       Objective:   Physical Exam  Filed Vitals:   08/24/14 1633  BP: 128/66  Pulse: 96  Temp: 98.6 F (37 C)  TempSrc: Oral  Height: 5\' 4"  (1.626 m)  Weight: 204 lb (92.534 kg)  SpO2: 98%  RA  Gen: well appearing, no acute distress HEENT: NCAT,  EOMi, OP clear,  PULM: CTA B CV: RRR, no mgr, no JVD AB: BS+, soft, nontender, Ext: warm, no edema, no clubbing, no cyanosis Derm: no rash or skin breakdown Neuro: A&Ox4,MAEW      Assessment & Plan:   Obstructive sleep apnea She continues to struggle with some insomnia. Today we reviewed sleep hygiene extensively. It sounds like she drinks a fair amount of caffeine in the form of diet soda. I advised her to cut out all caffeine. We also discussed the importance of keeping her room dark in the evenings with no distractions.  Plan: -Follow sleep hygiene recommendations -  Stop all caffeine -Request a download from CPAP machine -Continue CPAP -Advised not to drive while sleepy  Cough I am concerned that her cough is related to her swallowing. Apparently she recently had an ENT eval of her larynx which was normal. She does not have symptoms of acid reflux. It sounds like this occur solely while she is swallowing. She denies symptoms of dysphasia.  Plan: -Speech therapy evaluation for modified barium swallow -Chest x-ray    Updated Medication List Outpatient Encounter Prescriptions as of 08/24/2014  Medication Sig  . albuterol (PROVENTIL HFA;VENTOLIN HFA) 108 (90 BASE) MCG/ACT inhaler Inhale 2 puffs into the lungs every 6 (six)  hours as needed for wheezing or shortness of breath.  Marland Kitchen aspirin 81 MG tablet Take 81 mg by mouth daily.  . Canagliflozin-Metformin HCl (INVOKAMET) 150-500 MG TABS Take 1 tablet by mouth 2 (two) times daily.  . Cyanocobalamin 2500 MCG CHEW Chew by mouth daily.  . diclofenac sodium (VOLTAREN) 1 % GEL Apply topically as needed.  . Insulin Detemir (LEVEMIR FLEXPEN [place]) Inject 60 Units into the skin.   . Insulin Pen Needle (RELION PEN NEEDLE 31G/8MM) 31G X 8 MM MISC by Does not apply route 2 (two) times daily.  Marland Kitchen levothyroxine (SYNTHROID, LEVOTHROID) 25 MCG tablet Take 25 mcg by mouth daily before breakfast.  . losartan-hydrochlorothiazide (HYZAAR) 100-12.5 MG per tablet Take 1 tablet by mouth daily.  . metoprolol (LOPRESSOR) 50 MG tablet Take 50 mg by mouth daily.   Marland Kitchen omeprazole (PRILOSEC) 20 MG capsule Take 20 mg by mouth daily.  . pravastatin (PRAVACHOL) 80 MG tablet Take 80 mg by mouth daily.  . ranitidine (ZANTAC) 150 MG tablet Take 150 mg by mouth at bedtime.  . traZODone (DESYREL) 50 MG tablet Take 50 mg by mouth at bedtime.  Marland Kitchen venlafaxine (EFFEXOR) 75 MG tablet Take 75 mg by mouth daily.   Marland Kitchen VITAMIN D, CHOLECALCIFEROL, PO Take 20,000 Units by mouth daily.  . [DISCONTINUED] gabapentin (NEURONTIN) 300 MG capsule Take 300 mg by mouth 3 (three) times daily.  . [DISCONTINUED] montelukast (SINGULAIR) 10 MG tablet Take 10 mg by mouth at bedtime.

## 2014-08-24 NOTE — Patient Instructions (Signed)
We will request a speech therapy evaluation of your swallowing We will see you back in 6 months or sooner if needed

## 2014-08-24 NOTE — Assessment & Plan Note (Signed)
I am concerned that her cough is related to her swallowing. Apparently she recently had an ENT eval of her larynx which was normal. She does not have symptoms of acid reflux. It sounds like this occur solely while she is swallowing. She denies symptoms of dysphasia.  Plan: -Speech therapy evaluation for modified barium swallow -Chest x-ray

## 2014-08-25 ENCOUNTER — Other Ambulatory Visit (HOSPITAL_COMMUNITY): Payer: Self-pay | Admitting: Pulmonary Disease

## 2014-08-25 DIAGNOSIS — R05 Cough: Secondary | ICD-10-CM

## 2014-08-25 DIAGNOSIS — R059 Cough, unspecified: Secondary | ICD-10-CM

## 2014-08-26 ENCOUNTER — Telehealth: Payer: Self-pay

## 2014-08-26 DIAGNOSIS — R05 Cough: Secondary | ICD-10-CM

## 2014-08-26 DIAGNOSIS — R059 Cough, unspecified: Secondary | ICD-10-CM

## 2014-08-26 NOTE — Telephone Encounter (Signed)
Spoke with pt, she is aware to get cxr at [place] office.  cxr ordered.  Nothing further needed.

## 2014-08-26 NOTE — Telephone Encounter (Signed)
-----   Message from Juanito Doom, MD sent at 08/24/2014  5:20 PM EST ----- A, Please let her know that she needs a CXR for her cough Thanks B

## 2014-08-31 ENCOUNTER — Ambulatory Visit (HOSPITAL_COMMUNITY): Payer: Medicare Other

## 2014-09-01 ENCOUNTER — Ambulatory Visit: Payer: Self-pay | Admitting: Pulmonary Disease

## 2014-09-01 ENCOUNTER — Ambulatory Visit (INDEPENDENT_AMBULATORY_CARE_PROVIDER_SITE_OTHER): Payer: Medicare Other | Admitting: Podiatry

## 2014-09-01 VITALS — BP 122/67 | HR 90 | Resp 16

## 2014-09-01 DIAGNOSIS — L03115 Cellulitis of right lower limb: Secondary | ICD-10-CM

## 2014-09-01 DIAGNOSIS — L97511 Non-pressure chronic ulcer of other part of right foot limited to breakdown of skin: Secondary | ICD-10-CM

## 2014-09-01 MED ORDER — SILVER SULFADIAZINE 1 % EX CREA: 1.0000 "application " | TOPICAL_CREAM | Freq: Every day | CUTANEOUS | Status: DC

## 2014-09-01 MED ORDER — CEPHALEXIN 500 MG PO CAPS: 500.0000 mg | ORAL_CAPSULE | Freq: Three times a day (TID) | ORAL | Status: DC

## 2014-09-01 NOTE — Patient Instructions (Addendum)
Diabetic Shoe and Insoles Instructions   Congratulations on receiving your new shoes and insoles. In accordance with Medicare regulations, they have been selected from our own inventory or have been special ordered to provide you with the optimum comfort and protection. In order to receive the greatest benefit from this footwear, please follow these suggested guidelines.   Initial use of your diabetic shoes It may take a couple of days for you to feel fully comfortable wearing your new diabetic shoes and insoles. Some people are immediately comfortable wearing them, while others need more time to adjust. Generally, the body does not adapt to change rapidly and you may experience mild aches or fatigue when you first begin to wear your shoes.  Wear these shoes as long as they are comfortable. Try to only wear them indoors until you feel that they are comfortable for outdoor use.  If they bother your feet, TAKE THEM OFF and try them again a few hours later or the next day. Check for any soreness or swelling and notify your doctor immediately if you notice any of these signs. Otherwise, increase wear time as they become more comfortable. If you feel like your shoes are bothersome still after trying these tips, please call our office. Keep in mind, that once the outer sole of the shoe becomes dirty, we can no longer send them back.  The insoles should be changed every 4 months and replaced with a new pair.   Maintenance of your new shoes Your new diabetic shoes can be washed with mild soap and water and air dried. Try to keep away from high heat sources (heaters, dryers, fireplaces, etc.) as this may damage or distort the plastazote lining and insert in the shoe. Do NOT machine wash or use harsh chemicals such as bleach on your shoes. These shoes, if properly taken care of, should last one year.   Continuing Care Diabetic patients have fewer foot complications if they have been properly fitted in the  correct type of footwear and accommodating insoles. The desired outcome is to fit you with the most appropriate, best fitting shoe possible in order to reduce the risk of and prevent foot complications, which could ultimately lead to ulceration, infection and amputation. Medicare will pay for one pair of extra depth shoes and 3 pairs of insoles per calendar year.   REMEMBER TO SEE YOUR PODIATRIST FOR REGULAR FOOT EXAMS AT LEAST ONCE PER YEAR.     Monitor for any signs/symptoms of infection. Call the office immediately if any occur or go directly to the emergency room. Call with any questions/concerns.

## 2014-09-02 ENCOUNTER — Telehealth: Payer: Self-pay

## 2014-09-02 NOTE — Telephone Encounter (Signed)
-----   Message from Juanito Doom, MD sent at 09/01/2014  6:13 PM EST ----- A, Please let her know that her cpap download showed < 47% compliance which means that her insurance provider may not provide coverage.  If she is struggling with the mask then she can discuss with the DME company. Please review the sleep hygeine recs from my last office note with her. Thanks B

## 2014-09-02 NOTE — Progress Notes (Signed)
Patient ID: Summer [name], female   DOB: October 28, 1950, 63 y.o.   MRN: [id number]  Subjective: 63 year old female presents the office with a pickup diabetic shoes. She has new complaints as well on the right foot she has a sore on her right toe as well as her fifth toe that she states has been present for approximate 1.5 weeks has been progressive. She is unsure of how the wound started but they have been getting bigger. More recently she has noticed increasing swelling and redness to the toes. She denies any purulence from around the area or any red streaking. She denies any systemic complaints as fevers, chills, nausea, vomiting. No other complaints at this time.  Objective: AAO x3, NAD DP/PT pulses palpable b/l, CRT < 3 sec Protective sensation decreased with Simms Weinstein monofilament, decreased vibratory sensation. Left foot with rigid flatfoot deformity prominent talar head on the plantar medial aspect of the foot with valgus deformity present. There is limitation of motion within the subtalar joint. There is no overlying edema, erythema, increased warmth of left foot. On the right foot there are superficial wounds along the distal medial aspect of the right hallux as well as a medial aspect of the right fifth digit. Both wounds appear to be superficial however there is erythema extending over each of the first and fifth digits and edema overlying the forefoot. There is mild increase in warmth overlying the digits however there is no increase in warmth over the foot and there is no ascending cellulitis. The right hallux distal medial wound measures 21 x 1 cm and is fibro-granular in nature with a slight hyperkeratotic periwound. There is no areas of fluctuance or crepitus. The right fifth digit ulceration on the medial aspect of the fifth digit with macerated tissue surrounding. The wound is superficial and is no surrounding areas of fluctuance or crepitus. No ascending cellulitis. No probing,  undermining, tunneling to the wounds.  No other open lesions identified. No pain with calf compression, swelling, warmth, erythema.  Assessment: 63 year old female presents for diabetic shoe pickup however at today's appointment she has opened ulcers and infection to the right foot.  Plan: -Treatment options were discussed with the patient including alternatives, risks, competitions. -At this time each of the wounds are sharply debrided without complications. On the right hallux ulceration recommended continue Silvadene dressing changes daily followed by a dry sterile dressing. On the right fifth digit the wounds on the medial aspect and there is macerated tissue. Recommended a dry dressing over this area to help pad off between the fourth and fifth digits as well. Continue daily dressing changes. Can wash the wounds with antibiotic soap daily however after washing to dry thoroughly in between her toes and apply dressing. -Prescribed Keflex. -If wounds do not heal in the appropriate amount of time will obtain vascular studies. -Surgical shoe on the right.  -Dispensed diabetic shoes. The shoes appear to be fitting well without any high pressure areas. If there is any pain/redness after wearing the shoes, directed to discontinue them and call the office for modifications.  -Monitor for clinical signs or symptoms of worsening infection and directed to call the office immediately should any occur or go directly to the emergency room. -Follow-up in 1 week or sooner should any problems arise. In the meantime, call the office with any questions, concerns, change in symptoms.

## 2014-09-02 NOTE — Telephone Encounter (Signed)
lmtcb X1 for pt  

## 2014-09-08 ENCOUNTER — Ambulatory Visit (INDEPENDENT_AMBULATORY_CARE_PROVIDER_SITE_OTHER): Payer: Medicare Other | Admitting: Podiatry

## 2014-09-08 VITALS — BP 119/59 | HR 84 | Resp 16

## 2014-09-08 DIAGNOSIS — L03115 Cellulitis of right lower limb: Secondary | ICD-10-CM

## 2014-09-08 DIAGNOSIS — L97511 Non-pressure chronic ulcer of other part of right foot limited to breakdown of skin: Secondary | ICD-10-CM

## 2014-09-08 NOTE — Patient Instructions (Signed)
Continue daily dressing changes and wearing the surgical shoe.  Continue antibiotics.  Monitor for any signs/symptoms of worsening infection. Call the office immediately if any occur or go directly to the emergency room. Call with any questions/concerns.

## 2014-09-09 NOTE — Progress Notes (Signed)
Patient ID: Summer [name], female   DOB: Dec 05, 1950, 63 y.o.   MRN: [id number]  Subjective: 63 year old female returns the office they for evaluation of right foot ulceration/cellulitis of the hallux and fifth digit. Patient states that she has continue with antibiotics without any problems. She has been continuing daily dressing changes. Denies any systemic complaints as fevers, chills, nausea, vomiting. No acute changes since last appointment and no other complaints at this time.  Objective: AAO 3, NAD DP/PT pulses palpable bilaterally, CRT less than 3 seconds Protective sensation intact with Simms Weinstein monofilament Right foot superficial ulcerations on the distal medial aspect of the hallux along the medial aspect of the fifth digit. Both wounds appear to be superficial hyperkeratotic tissue overlying and surrounding the wounds. There is erythema to both of the digits however it is decreased compared to prior appointment. There is no ascending cellulitis. No tenderness to palpation. No areas of fluctuance or crepitus. No purulence or drainage identified. Interdigital maceration on the fifth digit has improved. No further open lesions are identified. No pain with calf compression, swelling, warmth, erythema.  Assessment: 63 year old female with healing ulcerations right foot  Plan: -Treatment options discussed with the patient including alternatives, risks, complication. -Wound sharply debrided without complications treatment of hyperkeratotic tissue to healthy, bleeding, granular wound base. Both wounds are superficial. Erythema has decreased compared to prior. Recommended continue with antibiotics. The patient does have a refill on the antibiotic. Discussed with her that if there is continued redness when she finishes his course of antibiotics to get the refill and continue. Continue daily dressing changes as discussed. Continue to monitor for any clinical signs or symptoms of worsening  infection and directed to call the office immediately should any occur or go directly to the emergency room. -Continue with surgical shoe until the wounds heal completely.  -Follow-up in 2 weeks or sooner should any palms arise. In the meantime, call the office with any questions, concerns, change in symptoms.

## 2014-09-14 ENCOUNTER — Ambulatory Visit: Payer: Self-pay | Admitting: Pulmonary Disease

## 2014-09-15 ENCOUNTER — Telehealth: Payer: Self-pay

## 2014-09-15 NOTE — Telephone Encounter (Signed)
Pt aware of cpap status.  Reviewed sleep hygeine with her and advised her to contact us if she needs new supplies or if she has difficulty tolerating cpap.  Nothing further needed.

## 2014-09-15 NOTE — Telephone Encounter (Signed)
Pt aware of cxr results.  Also states that she has been sick recently and not wearing her cpap but will try to be better with compliance.  I reviewed sleep hygeine mentioned at last visit with her.  Nothing further needed.

## 2014-09-15 NOTE — Telephone Encounter (Signed)
-----   Message from Juanito Doom, MD sent at 09/14/2014  8:02 PM EST ----- A, Please let her know that the CXR was OK Thanks B

## 2014-09-22 ENCOUNTER — Ambulatory Visit (INDEPENDENT_AMBULATORY_CARE_PROVIDER_SITE_OTHER): Payer: Medicare Other | Admitting: Podiatry

## 2014-09-22 VITALS — BP 114/63 | HR 87 | Resp 16

## 2014-09-22 DIAGNOSIS — L97511 Non-pressure chronic ulcer of other part of right foot limited to breakdown of skin: Secondary | ICD-10-CM

## 2014-09-22 NOTE — Progress Notes (Signed)
Patient ID: Summer [name], female   DOB: December 15, 1950, 64 y.o.   MRN: [id number]  Subjective: 64 year old female presents the office they for follow-up evaluation of right foot ulcerations on the hallux and fifth digit. She states that since last appointment she has been continuing wearing the surgical shoe as well as daily dressing changes with Silvadene. She states of the areas have almost healed. Since last appointment, she has started to notice a wound form on the left foot. She denies any recent redness or drainage from the wound. She denies any systemic complaints as fevers, chills, nausea, vomiting. No other complaints at this time no acute changes since last appointment.  Objective: AAO 3, NAD DP/PT pulses palpable, CRT less than 3 seconds Neurological status unchanged On the right foot hyperkeratotic lesion along the distal aspect of the right hallux. Upon debridement there is a slight superficial opening with very distal medial aspect of the digit. There is no surrounding erythema, edema, increase in warmth. No drainage or purulence identified. No tenderness to palpation. On the right fifth digit hyperkeratotic lesion on the medial aspect. Upon debridement no underlying ulceration this time and the wound is healed at the fifth digit. No overlying edema, erythema, increase in warmth. On the left foot on the dorsal aspect of the first MTPJ there is a 0.3 x0.2 cm superficial ulceration with a fibro-granular base. There is no sign erythema, edema, increase in warmth. No drainage or purulence is expressed from this area. No other open lesions or pre-ulcerative lesions are identified. No areas of pinpoint bony tenderness or pain the vibratory sensation bilaterally. No pain with calf compression, swelling, warmth, erythema.  Assessment: 64 year old female with healed right fifth digit ulcer, almost healed right hallux ulcer; new ulcer left dorsal first MTPJ  Plan: -Treatment options were discussed  with the patient including alternatives, risks, complications. -On the right foot hyperkeratotic lesions were sharply debrided without complications. The wound is healed to the fifth digit. Only a slight superficial opening is present within the distal aspect of the right hallux. No signs of infection. Continue with daily dressing changes to the hallux. Once the wound heals she can transfer back into a regular shoe. -On the left foot, ulceration or sharply debrided to healthy, bleeding, granular tissue. Recommended her to continue daily dressing changes in this area with Silvadene. With a surgical shoe on the left foot and she will transition into the regular shoe on the right. -It appears that the ulceration on the left dorsal first MTPJ started since wearing the new diabetic shoes. And her diabetic shoes she is also been wearing the ankle brace which is most likely causing too much bulk within the shoe. Recommended her not to wear the ankle brace with the diabetic shoe to see if this alleviates the pressure when she starts with shoe again. If it does not leave the pressure she should stop wearing the diabetic shoes and call the office.  -Follow-up in 2 weeks or sooner should any problems arise. In the meantime encouraged to call the office with any questions, concerns, change in symptoms.

## 2014-09-22 NOTE — Patient Instructions (Signed)
Continue daily dressing changes. Monitor for any signs/symptoms of infection. Call the office immediately if any occur or go directly to the emergency room. Call with any questions/concerns.  

## 2014-10-06 ENCOUNTER — Ambulatory Visit (INDEPENDENT_AMBULATORY_CARE_PROVIDER_SITE_OTHER): Payer: Medicare Other | Admitting: Podiatry

## 2014-10-06 VITALS — BP 123/73 | HR 89 | Resp 16

## 2014-10-06 DIAGNOSIS — S90221A Contusion of right lesser toe(s) with damage to nail, initial encounter: Secondary | ICD-10-CM

## 2014-10-06 DIAGNOSIS — L97501 Non-pressure chronic ulcer of other part of unspecified foot limited to breakdown of skin: Secondary | ICD-10-CM

## 2014-10-06 DIAGNOSIS — L97509 Non-pressure chronic ulcer of other part of unspecified foot with unspecified severity: Secondary | ICD-10-CM

## 2014-10-06 NOTE — Patient Instructions (Addendum)
Continue daily dressing changes as discussed Monitor for any signs/symptoms of infection. Call the office immediately if any occur or go directly to the emergency room. Call with any questions/concerns.  

## 2014-10-07 NOTE — Progress Notes (Signed)
Patient ID: Summer [name], female   DOB: [birth year], 64 y.o.   MRN: [id number]  Subjective:  64 year old female returns the office they for follow-up evaluation of left dorsal first MTPJ ulcer and ulcers on the right first and fifth digits. She states that the wound on the right foot of healed however the left-sided scabbed over. She denies any recent redness or drainage from around the wound sites. She denies any systemic complaints of infection. She does have new complaints of her right second toenail turning black. She denies any history of injury or trauma to the area. No other complaints at this time.  Objective: AAO 3, NAD DP/PT pulses palpable, CRT less than 3 seconds Protective sensation decreased with Semmes-Weinstein monofilament, decreased vibratory sensation Ulceration on the left dorsal first MTPJ with overlying hyperkeratotic tissue. Upon debridement of the hyperkeratotic tissue the ulceration measures proclaims 0.2 x 0.2 cm with a granular wound base without any evidence of probing, undermining, tunneling. There is a slight rim of erythema directly around the scab however there is no significant warmth, edema, ascending cellulitis. A small amount of serous drainage was identified upon debridement lesion however there is no frank purulence expressed. No other lesions on the left foot. On the right foot there is hyperkeratotic tissue overlying the hallux and fifth digit. Upon debridement is lesions and the underlying ulcerations have healed. There is no clinical signs of infection. No other lesions to the right foot.  The right second toenail has slight darkened discoloration to it however the nail is firmly adhered to the nail bed and there is no extension of discoloration into the skin and there is no surrounding erythema, drainage. No pain with calf compression, swelling, warmth, erythema.  Assessment: 64 year old female with healing bilateral foot wounds; right second digit subungual  hematoma without signs of infection  Plan: -Treatment options were discussed with the patient include alternatives, risks, complications. -Hyperkeratotic tissue along the wound/pre-ulcerative sites were sharply debrided without complications. -Continue daily dressing changes as discussed. And buttock ointment over the left foot wound. Continue to monitor the right foot. Can apply antibiotic ointment if needed. -Continue with surgical shoe and the left foot until the area has completely healed. -Follow-up in 3 weeks or sooner should any problems arise. In the meantime, encouraged call the office with any questions, concerns, change in symptoms.

## 2014-10-27 ENCOUNTER — Ambulatory Visit: Payer: Medicare Other | Admitting: Podiatry

## 2014-12-25 ENCOUNTER — Encounter: Payer: Self-pay | Admitting: Pulmonary Disease

## 2014-12-29 ENCOUNTER — Ambulatory Visit
Admit: 2014-12-29 | Disposition: A | Discharge status: Home / Self Care | Payer: Self-pay | Attending: Family Medicine | Admitting: Family Medicine

## 2014-12-29 ENCOUNTER — Encounter: Payer: Self-pay | Admitting: Pulmonary Disease

## 2015-02-10 ENCOUNTER — Telehealth: Payer: Self-pay | Admitting: Family Medicine

## 2015-02-10 NOTE — Telephone Encounter (Signed)
Left patient voicemail.

## 2015-02-24 ENCOUNTER — Other Ambulatory Visit: Payer: Self-pay | Admitting: Family Medicine

## 2015-02-24 DIAGNOSIS — G4700 Insomnia, unspecified: Secondary | ICD-10-CM

## 2015-02-24 NOTE — Telephone Encounter (Signed)
Patient is requesting a refill

## 2015-03-06 ENCOUNTER — Other Ambulatory Visit: Payer: Self-pay | Admitting: Family Medicine

## 2015-03-08 ENCOUNTER — Telehealth: Payer: Self-pay | Admitting: Family Medicine

## 2015-03-08 NOTE — Telephone Encounter (Signed)
Patient would like to know if we have any samples of Lantus. Please return her call.

## 2015-03-08 NOTE — Telephone Encounter (Signed)
Please give her samples

## 2015-03-08 NOTE — Telephone Encounter (Signed)
Patient is in her [facility] and Lantus will cost [phone] a month until the end of the year is up. Is there anything she can take in place of it? Patient is going to bring by forms from the Drug company for [place] to fill out for patient but she is completely out of Insulin since yesterday.

## 2015-03-09 NOTE — Telephone Encounter (Signed)
Give her Tujeo until the medication comes in, try same dose but may need to titrate up

## 2015-03-09 NOTE — Telephone Encounter (Signed)
Patient given Summer [name], also explained in replace of Lantus and filled out paperwork for Drug company to help patient until out of the donut hole next year.

## 2015-03-09 NOTE — Telephone Encounter (Signed)
Sorry we do not carry Lantus samples, is there something you can give her as a alternative that we do carry as samples?

## 2015-03-19 ENCOUNTER — Telehealth: Payer: Self-pay

## 2015-03-19 ENCOUNTER — Other Ambulatory Visit: Payer: Self-pay

## 2015-03-19 DIAGNOSIS — E119 Type 2 diabetes mellitus without complications: Secondary | ICD-10-CM

## 2015-03-19 MED ORDER — INSULIN GLARGINE 300 UNIT/ML ~~LOC~~ SOPN: 60.0000 [IU]/d | PEN_INJECTOR | SUBCUTANEOUS | Status: DC

## 2015-03-19 NOTE — Telephone Encounter (Signed)
Pharmacy called needing clarification on 1 day or 1 dose for Toujeo prescription, informed the pharmacist it is once daily.

## 2015-04-10 ENCOUNTER — Other Ambulatory Visit: Payer: Self-pay | Admitting: Family Medicine

## 2015-04-16 ENCOUNTER — Other Ambulatory Visit: Payer: Self-pay | Admitting: Family Medicine

## 2015-04-16 NOTE — Telephone Encounter (Signed)
Per patient request: she would like to cancel the order for Toujeo. She just received the Lantus prescription here in the office

## 2015-04-16 NOTE — Telephone Encounter (Signed)
Patient requesting to DC the [place].

## 2015-04-17 ENCOUNTER — Encounter: Payer: Self-pay | Admitting: Family Medicine

## 2015-04-17 DIAGNOSIS — M7060 Trochanteric bursitis, unspecified hip: Secondary | ICD-10-CM | POA: Insufficient documentation

## 2015-04-17 DIAGNOSIS — L719 Rosacea, unspecified: Secondary | ICD-10-CM | POA: Insufficient documentation

## 2015-04-17 DIAGNOSIS — I152 Hypertension secondary to endocrine disorders: Secondary | ICD-10-CM | POA: Insufficient documentation

## 2015-04-17 DIAGNOSIS — R29818 Other symptoms and signs involving the nervous system: Secondary | ICD-10-CM | POA: Insufficient documentation

## 2015-04-17 DIAGNOSIS — F339 Major depressive disorder, recurrent, unspecified: Secondary | ICD-10-CM | POA: Insufficient documentation

## 2015-04-17 DIAGNOSIS — G5600 Carpal tunnel syndrome, unspecified upper limb: Secondary | ICD-10-CM | POA: Insufficient documentation

## 2015-04-17 DIAGNOSIS — K219 Gastro-esophageal reflux disease without esophagitis: Secondary | ICD-10-CM | POA: Insufficient documentation

## 2015-04-17 DIAGNOSIS — R51 Headache: Secondary | ICD-10-CM

## 2015-04-17 DIAGNOSIS — G2401 Drug induced subacute dyskinesia: Secondary | ICD-10-CM | POA: Insufficient documentation

## 2015-04-17 DIAGNOSIS — E1142 Type 2 diabetes mellitus with diabetic polyneuropathy: Secondary | ICD-10-CM | POA: Insufficient documentation

## 2015-04-17 DIAGNOSIS — G4700 Insomnia, unspecified: Secondary | ICD-10-CM | POA: Insufficient documentation

## 2015-04-17 DIAGNOSIS — H9190 Unspecified hearing loss, unspecified ear: Secondary | ICD-10-CM | POA: Insufficient documentation

## 2015-04-17 DIAGNOSIS — R413 Other amnesia: Secondary | ICD-10-CM | POA: Insufficient documentation

## 2015-04-17 DIAGNOSIS — E1159 Type 2 diabetes mellitus with other circulatory complications: Secondary | ICD-10-CM | POA: Insufficient documentation

## 2015-04-17 DIAGNOSIS — I872 Venous insufficiency (chronic) (peripheral): Secondary | ICD-10-CM | POA: Insufficient documentation

## 2015-04-17 DIAGNOSIS — K5909 Other constipation: Secondary | ICD-10-CM | POA: Insufficient documentation

## 2015-04-17 DIAGNOSIS — Z6841 Body Mass Index (BMI) 40.0 and over, adult: Secondary | ICD-10-CM | POA: Insufficient documentation

## 2015-04-17 DIAGNOSIS — J3089 Other allergic rhinitis: Secondary | ICD-10-CM

## 2015-04-17 DIAGNOSIS — I10 Essential (primary) hypertension: Secondary | ICD-10-CM | POA: Insufficient documentation

## 2015-04-17 DIAGNOSIS — R519 Headache, unspecified: Secondary | ICD-10-CM | POA: Insufficient documentation

## 2015-04-17 DIAGNOSIS — N183 Chronic kidney disease, stage 3 unspecified: Secondary | ICD-10-CM | POA: Insufficient documentation

## 2015-04-17 DIAGNOSIS — G9519 Other vascular myelopathies: Secondary | ICD-10-CM | POA: Insufficient documentation

## 2015-04-17 DIAGNOSIS — E669 Obesity, unspecified: Secondary | ICD-10-CM | POA: Insufficient documentation

## 2015-04-17 DIAGNOSIS — M7540 Impingement syndrome of unspecified shoulder: Secondary | ICD-10-CM | POA: Insufficient documentation

## 2015-04-17 DIAGNOSIS — R6889 Other general symptoms and signs: Secondary | ICD-10-CM | POA: Insufficient documentation

## 2015-04-17 DIAGNOSIS — M48062 Spinal stenosis, lumbar region with neurogenic claudication: Secondary | ICD-10-CM | POA: Insufficient documentation

## 2015-04-17 DIAGNOSIS — N1832 Chronic kidney disease, stage 3b: Secondary | ICD-10-CM | POA: Insufficient documentation

## 2015-04-17 DIAGNOSIS — R42 Dizziness and giddiness: Secondary | ICD-10-CM | POA: Insufficient documentation

## 2015-04-17 DIAGNOSIS — E2839 Other primary ovarian failure: Secondary | ICD-10-CM | POA: Insufficient documentation

## 2015-04-17 DIAGNOSIS — E039 Hypothyroidism, unspecified: Secondary | ICD-10-CM | POA: Insufficient documentation

## 2015-04-17 DIAGNOSIS — J302 Other seasonal allergic rhinitis: Secondary | ICD-10-CM | POA: Insufficient documentation

## 2015-04-20 ENCOUNTER — Ambulatory Visit (INDEPENDENT_AMBULATORY_CARE_PROVIDER_SITE_OTHER): Payer: Medicare Other | Admitting: Family Medicine

## 2015-04-20 ENCOUNTER — Encounter: Payer: Self-pay | Admitting: Family Medicine

## 2015-04-20 VITALS — BP 122/76 | HR 97 | Temp 98.1°F | Resp 16 | Ht 64.0 in | Wt 218.7 lb

## 2015-04-20 DIAGNOSIS — K219 Gastro-esophageal reflux disease without esophagitis: Secondary | ICD-10-CM | POA: Diagnosis not present

## 2015-04-20 DIAGNOSIS — I10 Essential (primary) hypertension: Secondary | ICD-10-CM

## 2015-04-20 DIAGNOSIS — N183 Chronic kidney disease, stage 3 unspecified: Secondary | ICD-10-CM

## 2015-04-20 DIAGNOSIS — G4700 Insomnia, unspecified: Secondary | ICD-10-CM

## 2015-04-20 DIAGNOSIS — E038 Other specified hypothyroidism: Secondary | ICD-10-CM | POA: Diagnosis not present

## 2015-04-20 DIAGNOSIS — E1142 Type 2 diabetes mellitus with diabetic polyneuropathy: Secondary | ICD-10-CM | POA: Diagnosis not present

## 2015-04-20 DIAGNOSIS — E785 Hyperlipidemia, unspecified: Secondary | ICD-10-CM | POA: Diagnosis not present

## 2015-04-20 DIAGNOSIS — F339 Major depressive disorder, recurrent, unspecified: Secondary | ICD-10-CM

## 2015-04-20 DIAGNOSIS — R0600 Dyspnea, unspecified: Secondary | ICD-10-CM | POA: Diagnosis not present

## 2015-04-20 DIAGNOSIS — G2401 Drug induced subacute dyskinesia: Secondary | ICD-10-CM

## 2015-04-20 DIAGNOSIS — R5383 Other fatigue: Secondary | ICD-10-CM | POA: Diagnosis not present

## 2015-04-20 DIAGNOSIS — E669 Obesity, unspecified: Secondary | ICD-10-CM

## 2015-04-20 LAB — POCT GLYCOSYLATED HEMOGLOBIN (HGB A1C): Hemoglobin A1C: 7.5

## 2015-04-20 MED ORDER — SERTRALINE HCL 100 MG PO TABS: 150.0000 mg | ORAL_TABLET | Freq: Every day | ORAL | Status: DC

## 2015-04-20 MED ORDER — ZOLPIDEM TARTRATE 10 MG PO TABS: 10.0000 mg | ORAL_TABLET | Freq: Every day | ORAL | Status: DC

## 2015-04-20 MED ORDER — LORATADINE 10 MG PO TABS: 10.0000 mg | ORAL_TABLET | Freq: Every day | ORAL | Status: DC

## 2015-04-20 MED ORDER — OMEPRAZOLE 20 MG PO CPDR: 20.0000 mg | DELAYED_RELEASE_CAPSULE | Freq: Every day | ORAL | Status: DC

## 2015-04-20 MED ORDER — PRAVASTATIN SODIUM 80 MG PO TABS: 80.0000 mg | ORAL_TABLET | Freq: Every day | ORAL | Status: DC

## 2015-04-20 MED ORDER — LOSARTAN POTASSIUM-HCTZ 100-12.5 MG PO TABS: 1.0000 | ORAL_TABLET | Freq: Every day | ORAL | Status: DC

## 2015-04-20 NOTE — Progress Notes (Signed)
Name: Summer [name]   MRN: [id number]    DOB: 06-Apr-1951   Date:04/20/2015       Progress Note  Subjective  Chief Complaint  Chief Complaint  Patient presents with  . Medication Refill    3 month F/U  . Asthma    Ran out of Ventolin Inhaler for the past 2-3 months, due to the [facility] can not afford medication- having short of breath.  . Diabetes    Checks once daily-Low-100 Average-150 High-211.  Marland Kitchen Hypertension    Having extreme headache for the past few months.   . Hypothyroidism    Getting worst, Weight Gain-11 pounds in the past 3 months, Irritable, Hair loss.  . Insomnia    After taking medication still takes 3-4 hours to fall asleep, Total hours of sleep 4-6 hours.  . Hyperlipidemia    No symptoms    HPI   SOB: she was seen by Dr. Lake Bells in 2015 for fatigue and SOB, she had multiple tests and the only positive finding was OSA, she states she used the CPAP machine for a few months but did not notice an improvement of her symptoms.  She denies a cough at this time but still feels very tired, SOB even when walking inside her house.   DMII: she has been compliant with her medications, hgbA1C is elevated at 7.5% but has improved since last checked in April when it was 9%.  She is still gaining weight and not following a diabetic diet. She has peripheral neuropathy , and takes Lyrica with improvement of symptoms  HTN: taking medication and denies side effects, bp is at goal  Hypothyroidism: feeling very tired, no energy, no constipation, no dry skin, but has noticed hair loss and weight gain.  Insomnia: taking Ambien, takes her a couple of hours to fall asleep but is able to stay asleep with medication  Major Depressive Disorder: anhedonia - she is not even reading now, weight gain, fatigue, denies crying spells, tardive dyskinesia has improved since stopped Effexor   Patient Active Problem List   Diagnosis Date Noted  . Carpal tunnel syndrome 04/17/2015  . Chronic  constipation 04/17/2015  . Insomnia, persistent 04/17/2015  . Chronic kidney disease (CKD), stage III (moderate) 04/17/2015  . Decreased exercise tolerance 04/17/2015  . Diabetes mellitus with polyneuropathy 04/17/2015  . Gastro-esophageal reflux disease without esophagitis 04/17/2015  . Bursitis, trochanteric 04/17/2015  . Cephalalgia 04/17/2015  . Benign hypertension 04/17/2015  . Adult hypothyroidism 04/17/2015  . Hearing loss 04/17/2015  . Chronic recurrent major depressive disorder 04/17/2015  . Dyslipidemia 04/17/2015  . Neurogenic claudication 04/17/2015  . Obesity (BMI 35.0-39.9 without comorbidity) 04/17/2015  . Hypo-ovarianism 04/17/2015  . Perennial allergic rhinitis with seasonal variation 04/17/2015  . Acne erythematosa 04/17/2015  . Dyskinesia, tardive 04/17/2015  . Memory loss 04/17/2015  . Impingement syndrome of shoulder 04/17/2015  . Dermatitis, stasis 04/17/2015  . Obstructive sleep apnea 05/14/2014  . Dyspnea 05/06/2014  . Hyperlipidemia 02/06/2012  . LBP (low back pain) 09/16/2008    Past Surgical History  Procedure Laterality Date  . Tubal ligation    . Vaginal hysterectomy  1989  . Neck surgery    . Cataract extraction  01/2011    right  . Eye lid surgery  2013    bilateral    Family History  Problem Relation Age of Onset  . Heart attack Mother   . Aneurysm Mother     History   Social History  . Marital  Status: Widowed    Spouse Name: N/A  . Number of Children: N/A  . Years of Education: N/A   Occupational History  . Not on file.   Social History Main Topics  . Smoking status: Never Smoker   . Smokeless tobacco: Never Used  . Alcohol Use: No  . Drug Use: No  . Sexual Activity:    Partners: Male   Other Topics Concern  . Not on file   Social History Narrative     Current outpatient prescriptions:  .  albuterol (PROVENTIL HFA;VENTOLIN HFA) 108 (90 BASE) MCG/ACT inhaler, Inhale 2 puffs into the lungs every 6 (six) hours as  needed for wheezing or shortness of breath., Disp: , Rfl:  .  aspirin 81 MG tablet, Take 81 mg by mouth daily., Disp: , Rfl:  .  Canagliflozin-Metformin HCl (INVOKAMET) 150-500 MG TABS, Take 1 tablet by mouth 2 (two) times daily., Disp: , Rfl:  .  Cyanocobalamin 2500 MCG CHEW, Chew by mouth daily., Disp: , Rfl:  .  diclofenac sodium (VOLTAREN) 1 % GEL, Apply topically as needed., Disp: , Rfl:  .  Exenatide ER (BYDUREON) 2 MG PEN, Inject 1 pen into the skin once a week., Disp: , Rfl:  .  glucose blood test strip, , Disp: , Rfl:  .  GLUCOSE BLOOD VI, , Disp: , Rfl:  .  Insulin Pen Needle (RELION PEN NEEDLE 31G/8MM) 31G X 8 MM MISC, by Does not apply route 2 (two) times daily., Disp: , Rfl:  .  levothyroxine (SYNTHROID, LEVOTHROID) 25 MCG tablet, TAKE ONE TABLET BY MOUTH ONCE DAILY, Disp: 90 tablet, Rfl: 0 .  loratadine (CLARITIN) 10 MG tablet, Take 1 tablet (10 mg total) by mouth daily., Disp: 30 tablet, Rfl: 5 .  losartan-hydrochlorothiazide (HYZAAR) 100-12.5 MG per tablet, Take 1 tablet by mouth daily., Disp: 30 tablet, Rfl: 5 .  metoprolol tartrate (LOPRESSOR) 25 MG tablet, TAKE ONE TABLET BY MOUTH TWICE DAILY, Disp: 60 tablet, Rfl: 0 .  montelukast (SINGULAIR) 10 MG tablet, Take 1 tablet by mouth daily., Disp: , Rfl:  .  omeprazole (PRILOSEC) 20 MG capsule, Take 1 capsule (20 mg total) by mouth daily., Disp: 30 capsule, Rfl: 5 .  pravastatin (PRAVACHOL) 80 MG tablet, Take 1 tablet (80 mg total) by mouth daily., Disp: 30 tablet, Rfl: 5 .  pregabalin (LYRICA) 300 MG capsule, Take 1 capsule by mouth 2 (two) times daily., Disp: , Rfl:  .  ranitidine (ZANTAC) 150 MG tablet, Take 150 mg by mouth at bedtime., Disp: , Rfl:  .  sertraline (ZOLOFT) 100 MG tablet, Take 1.5 tablets (150 mg total) by mouth daily., Disp: 30 tablet, Rfl: 5 .  TOUJEO SOLOSTAR 300 UNIT/ML SOPN, INJECT 60 UNITS PER DAY INTO THE SKIN ONCE DAILY, Disp: 6 pen, Rfl: 0 .  VITAMIN D, CHOLECALCIFEROL, PO, Take 20,000 Units by mouth  daily., Disp: , Rfl:  .  zolpidem (AMBIEN) 10 MG tablet, Take 1 tablet (10 mg total) by mouth at bedtime., Disp: 30 tablet, Rfl: 2  Allergies  Allergen Reactions  . Atorvastatin     muscle pain  . Codeine   . Latex Rash     ROS  Constitutional: Negative for fever positive for  weight change- gain.  Respiratory: Negative for cough positive for  shortness of breath.   Cardiovascular: Negative for chest pain or palpitations.  Gastrointestinal: Negative for abdominal pain, no bowel changes.  Musculoskeletal: Negative for gait problem or joint swelling.  Skin: Negative for rash.  Neurological: Negative for dizziness or headache.  No other specific complaints in a complete review of systems (except as listed in HPI above).  Objective  Filed Vitals:   04/20/15 1017  BP: 122/76  Pulse: 97  Temp: 98.1 F (36.7 C)  TempSrc: Oral  Resp: 16  Height: 5\' 4"  (1.626 m)  Weight: 218 lb 11.2 oz (99.202 kg)  SpO2: 96%    Body mass index is 37.52 kg/(m^2).  Physical Exam    Recent Results (from the past 2160 hour(s))  POCT HgB A1C     Status: Abnormal   Collection Time: 04/20/15 10:25 AM  Result Value Ref Range   Hemoglobin A1C 7.5     Diabetic Foot Exam:  Dr. Earleen Newport - she has a collapsed arch on left foot , due for surgery  Diabetic Foot Exam - Simple   Simple Foot Form  Visual Inspection  See comments:  Yes  Sensation Testing  See comments:  Yes  Pulse Check  Posterior Tibialis and Dorsalis pulse intact bilaterally:  Yes  Comments  Has collapsed arch on left side, thick toenails, no sensation to monofilament test      PHQ2/9: Depression screen PHQ 2/9 04/20/2015  Decreased Interest 3  Down, Depressed, Hopeless 3  PHQ - 2 Score 6  Altered sleeping 3  Tired, decreased energy 3  Change in appetite 3  Feeling bad or failure about yourself  3  Trouble concentrating 3  Moving slowly or fidgety/restless 3  Suicidal thoughts 3  PHQ-9 Score 27  Difficult doing  work/chores Very difficult     Fall Risk: Fall Risk  04/20/2015  Falls in the past year? No     Assessment & Plan  1. Diabetic polyneuropathy associated with type 2 diabetes mellitus Glucose has improved, continue Lyrica, and medication, needs to lose weight and resume a diabetic diet to get it to goal - POCT HgB A1C  2. Hyperlipidemia -lipid panel  - pravastatin (PRAVACHOL) 80 MG tablet; Take 1 tablet (80 mg total) by mouth daily.  Dispense: 30 tablet; Refill: 5  3. Chronic recurrent major depressive disorder Discussed need to see psychiatrist but she refuses, explained likely the cause of fatigue - sertraline (ZOLOFT) 100 MG tablet; Take 1.5 tablets (150 mg total) by mouth daily.  Dispense: 30 tablet; Refill: 5 - Lipid Profile  4. Dyskinesia, tardive stable  5. Benign hypertension At goal - losartan-hydrochlorothiazide (HYZAAR) 100-12.5 MG per tablet; Take 1 tablet by mouth daily.  Dispense: 30 tablet; Refill: 5 - Comprehensive Metabolic Panel (CMET)  6. Insomnia, persistent Continue medication   7. Chronic kidney disease (CKD), stage III (moderate) Recheck labs  8. Other specified hypothyroidism Recheck labs - TSH  9. Dyspnea Seen by Pulmonologist unknown etiology, likely multifactorial  10. Obesity (BMI 35.0-39.9 without comorbidity) Discussed diet and exercise  11. Insomnia  - zolpidem (AMBIEN) 10 MG tablet; Take 1 tablet (10 mg total) by mouth at bedtime.  Dispense: 30 tablet; Refill: 2  12. Other fatigue  - Vitamin D (25 hydroxy) - Vitamin B12  13. Gastroesophageal reflux disease without esophagitis  - omeprazole (PRILOSEC) 20 MG capsule; Take 1 capsule (20 mg total) by mouth daily.  Dispense: 30 capsule; Refill: 5

## 2015-04-21 ENCOUNTER — Other Ambulatory Visit: Payer: Self-pay

## 2015-04-21 DIAGNOSIS — F339 Major depressive disorder, recurrent, unspecified: Secondary | ICD-10-CM

## 2015-04-21 MED ORDER — SERTRALINE HCL 100 MG PO TABS: 150.0000 mg | ORAL_TABLET | Freq: Every day | ORAL | Status: DC

## 2015-04-21 NOTE — Telephone Encounter (Signed)
Pharmacy faxed [place] back stating a qty of 30 pills was sent for patient Zoloft, but this would be only a 20 day supply. Patient takes 1.5 tablets daily, so it needs to a qty of 45 for a 1 month supply. Changed qty amount and resent that to Dr. Ancil Boozer to fax back to pharmacy.

## 2015-04-22 ENCOUNTER — Telehealth: Payer: Self-pay

## 2015-04-22 ENCOUNTER — Other Ambulatory Visit: Payer: Self-pay | Admitting: Family Medicine

## 2015-04-22 LAB — LIPID PANEL
Chol/HDL Ratio: 8.2 ratio units — ABNORMAL HIGH (ref 0.0–4.4)
Cholesterol, Total: 263 mg/dL — ABNORMAL HIGH (ref 100–199)
HDL: 32 mg/dL — ABNORMAL LOW (ref >39)
Triglycerides: 882 mg/dL (ref 0–149)

## 2015-04-22 LAB — COMPREHENSIVE METABOLIC PANEL
ALT: 22 IU/L (ref 0–32)
AST: 18 IU/L (ref 0–40)
Albumin/Globulin Ratio: 2.2 (ref 1.1–2.5)
Albumin: 4.2 g/dL (ref 3.6–4.8)
Alkaline Phosphatase: 77 IU/L (ref 39–117)
BUN/Creatinine Ratio: 24 (ref 11–26)
BUN: 25 mg/dL (ref 8–27)
Bilirubin Total: 0.3 mg/dL (ref 0.0–1.2)
CO2: 22 mmol/L (ref 18–29)
Calcium: 9.9 mg/dL (ref 8.7–10.3)
Chloride: 101 mmol/L (ref 97–108)
Creatinine, Ser: 1.04 mg/dL — ABNORMAL HIGH (ref 0.57–1.00)
GFR calc Af Amer: 65 mL/min/{1.73_m2} (ref >59)
GFR calc non Af Amer: 56 mL/min/{1.73_m2} — ABNORMAL LOW (ref >59)
Globulin, Total: 1.9 g/dL (ref 1.5–4.5)
Glucose: 196 mg/dL — ABNORMAL HIGH (ref 65–99)
Potassium: 4.4 mmol/L (ref 3.5–5.2)
Sodium: 143 mmol/L (ref 134–144)
Total Protein: 6.1 g/dL (ref 6.0–8.5)

## 2015-04-22 LAB — VITAMIN B12: Vitamin B-12: 1184 pg/mL — ABNORMAL HIGH (ref 211–946)

## 2015-04-22 LAB — TSH: TSH: 2.42 u[IU]/mL (ref 0.450–4.500)

## 2015-04-22 LAB — VITAMIN D 25 HYDROXY (VIT D DEFICIENCY, FRACTURES): Vit D, 25-Hydroxy: 19.5 ng/mL — ABNORMAL LOW (ref 30.0–100.0)

## 2015-04-22 MED ORDER — OMEGA-3-ACID ETHYL ESTERS 1 G PO CAPS: 1.0000 g | ORAL_CAPSULE | Freq: Two times a day (BID) | ORAL | Status: DC

## 2015-04-22 NOTE — Progress Notes (Signed)
Patient notified

## 2015-04-22 NOTE — Telephone Encounter (Signed)
-----   Message from Steele Sizer, MD sent at 04/22/2015  8:14 AM EDT ----- Triglycerides is dangerously high, it may cause pancreatitis, LDL not able to be calculated because of it. I will start her on Lovaza Fasting glucose was also high, needs to be below 140 fasting for her, CKI stage III, normal liver function test TSH within normal limits - continue current dose of Levothyroxine Vitamin D is low, needs to take vitamin D otc 2000units daily

## 2015-04-22 NOTE — Telephone Encounter (Signed)
Patient notified of labs and would like to talk to you Dr. Ancil Boozer about her Cholesterol and questions about Lovaza. Could you please call patient. Also patient states she takes Vitamin D every day does she need something stronger?

## 2015-04-23 NOTE — Telephone Encounter (Signed)
Spoke to patient, she already got prescription of Lovaza, also advised to stop fried food. We will monitor her kidney function and she will avoid nsaid's

## 2015-05-14 ENCOUNTER — Other Ambulatory Visit: Payer: Self-pay | Admitting: Family Medicine

## 2015-05-14 NOTE — Telephone Encounter (Signed)
Patient requesting refill. 

## 2015-05-21 ENCOUNTER — Telehealth: Payer: Self-pay | Admitting: *Deleted

## 2015-05-21 NOTE — Telephone Encounter (Signed)
Called patient and stated that we could order shoes without being seen by Dr Jacqualyn Posey and that was ok'ed by Dr Jacqualyn Posey as well. Lattie Haw

## 2015-05-24 ENCOUNTER — Encounter: Payer: Self-pay | Admitting: Family Medicine

## 2015-05-24 ENCOUNTER — Ambulatory Visit (INDEPENDENT_AMBULATORY_CARE_PROVIDER_SITE_OTHER): Payer: Medicare Other | Admitting: Family Medicine

## 2015-05-24 VITALS — BP 116/60 | HR 95 | Temp 98.0°F | Resp 14 | Ht 64.0 in | Wt 220.3 lb

## 2015-05-24 DIAGNOSIS — Z23 Encounter for immunization: Secondary | ICD-10-CM

## 2015-05-24 DIAGNOSIS — N183 Chronic kidney disease, stage 3 unspecified: Secondary | ICD-10-CM

## 2015-05-24 DIAGNOSIS — R0600 Dyspnea, unspecified: Secondary | ICD-10-CM | POA: Diagnosis not present

## 2015-05-24 DIAGNOSIS — F339 Major depressive disorder, recurrent, unspecified: Secondary | ICD-10-CM | POA: Diagnosis not present

## 2015-05-24 DIAGNOSIS — I10 Essential (primary) hypertension: Secondary | ICD-10-CM

## 2015-05-24 DIAGNOSIS — E785 Hyperlipidemia, unspecified: Secondary | ICD-10-CM | POA: Diagnosis not present

## 2015-05-24 DIAGNOSIS — G4440 Drug-induced headache, not elsewhere classified, not intractable: Secondary | ICD-10-CM | POA: Diagnosis not present

## 2015-05-24 MED ORDER — OMEGA-3-ACID ETHYL ESTERS 1 G PO CAPS: 1.0000 g | ORAL_CAPSULE | Freq: Two times a day (BID) | ORAL | Status: DC

## 2015-05-24 MED ORDER — LOSARTAN POTASSIUM-HCTZ 100-12.5 MG PO TABS: 0.5000 | ORAL_TABLET | Freq: Every day | ORAL | Status: DC

## 2015-05-24 NOTE — Progress Notes (Signed)
Name: Summer [name]   MRN: [id number]    DOB: 12-10-1950   Date:05/24/2015       Progress Note  Subjective  Chief Complaint  Chief Complaint  Patient presents with  . Medication Refill    1 month F/U  . Depression    Getting worst, patient states she is very short-tempered and cussing alot more than she used to.   . Shortness of Breath    Patient states her SOB is unchanged and not taking any Inhalers  . Fatigue    Getting worst and feels like it is from Invokana medication, since starting this one she has been dragging every since.  Marland Kitchen Headache    Having intermittent headaches for the past couple of months, starts in the front of head and then radiates. Patient has to lay down to help them go away and takes Excedrin but it does not help.    HPI  Major Depressive Episode: she works 13 hours daily, gets home and only eats and sleep. She states no motivation when off work, wants to sleep all the time. Also getting snappy and angry. She has sees a therapist in the past but discussed importance of seeing a psychiatrist for medication management and she agrees.  Explained that depression makes her tired, no motivation to move, therefore glucose and weight goes up, and also fatigue for lack of exercise.   SOB:  Seen by cardiologist and pulmonologist last year ( 08/2014) studies normal, likely multifactorial. Explained that inhaler will not help symptoms.   Headaches: she has been taking Excedrin for headaches almost daily, explained rebound headaches and the need to stop all pain medications, sleep, cold compresses, drink water  HTN: bp is low, it may also be contributing to fatigue, start taking half pill of Losartan/hctz to see if symptoms improve Patient Active Problem List   Diagnosis Date Noted  . Dyslipidemia 05/24/2015  . Carpal tunnel syndrome 04/17/2015  . Chronic constipation 04/17/2015  . Insomnia, persistent 04/17/2015  . Chronic kidney disease (CKD), stage III (moderate)  04/17/2015  . Decreased exercise tolerance 04/17/2015  . Diabetes mellitus with polyneuropathy 04/17/2015  . Gastro-esophageal reflux disease without esophagitis 04/17/2015  . Bursitis, trochanteric 04/17/2015  . Cephalalgia 04/17/2015  . Benign hypertension 04/17/2015  . Adult hypothyroidism 04/17/2015  . Hearing loss 04/17/2015  . Chronic recurrent major depressive disorder 04/17/2015  . Dyslipidemia 04/17/2015  . Neurogenic claudication 04/17/2015  . Obesity (BMI 35.0-39.9 without comorbidity) 04/17/2015  . Hypo-ovarianism 04/17/2015  . Perennial allergic rhinitis with seasonal variation 04/17/2015  . Acne erythematosa 04/17/2015  . Dyskinesia, tardive 04/17/2015  . Memory loss 04/17/2015  . Impingement syndrome of shoulder 04/17/2015  . Dermatitis, stasis 04/17/2015  . Obstructive sleep apnea 05/14/2014  . Dyspnea 05/06/2014  . Hyperlipidemia 02/06/2012  . LBP (low back pain) 09/16/2008    Past Surgical History  Procedure Laterality Date  . Tubal ligation    . Vaginal hysterectomy  1989  . Neck surgery    . Cataract extraction  01/2011    right  . Eye lid surgery  2013    bilateral    Family History  Problem Relation Age of Onset  . Heart attack Mother   . Aneurysm Mother     Social History   Social History  . Marital Status: Widowed    Spouse Name: N/A  . Number of Children: N/A  . Years of Education: N/A   Occupational History  . Not on file.   Social  History Main Topics  . Smoking status: Never Smoker   . Smokeless tobacco: Never Used  . Alcohol Use: No  . Drug Use: No  . Sexual Activity:    Partners: Male   Other Topics Concern  . Not on file   Social History Narrative     Current outpatient prescriptions:  .  aspirin 81 MG tablet, Take 81 mg by mouth daily., Disp: , Rfl:  .  Canagliflozin-Metformin HCl (INVOKAMET) 150-500 MG TABS, Take 1 tablet by mouth 2 (two) times daily., Disp: , Rfl:  .  Cyanocobalamin 2500 MCG CHEW, Chew by mouth  daily., Disp: , Rfl:  .  diclofenac sodium (VOLTAREN) 1 % GEL, Apply topically as needed., Disp: , Rfl:  .  Exenatide ER (BYDUREON) 2 MG PEN, Inject 1 pen into the skin once a week., Disp: , Rfl:  .  glucose blood test strip, , Disp: , Rfl:  .  GLUCOSE BLOOD VI, , Disp: , Rfl:  .  Insulin Pen Needle (RELION PEN NEEDLE 31G/8MM) 31G X 8 MM MISC, by Does not apply route 2 (two) times daily., Disp: , Rfl:  .  levothyroxine (SYNTHROID, LEVOTHROID) 25 MCG tablet, TAKE ONE TABLET BY MOUTH ONCE DAILY, Disp: 90 tablet, Rfl: 0 .  loratadine (CLARITIN) 10 MG tablet, Take 1 tablet (10 mg total) by mouth daily., Disp: 30 tablet, Rfl: 5 .  losartan-hydrochlorothiazide (HYZAAR) 100-12.5 MG per tablet, Take 0.5 tablets by mouth daily., Disp: 30 tablet, Rfl: 5 .  metoprolol tartrate (LOPRESSOR) 25 MG tablet, TAKE ONE TABLET BY MOUTH TWICE DAILY, Disp: 60 tablet, Rfl: 5 .  montelukast (SINGULAIR) 10 MG tablet, Take 1 tablet by mouth daily., Disp: , Rfl:  .  omega-3 acid ethyl esters (LOVAZA) 1 G capsule, Take 1 capsule (1 g total) by mouth 2 (two) times daily., Disp: 60 capsule, Rfl: 5 .  omeprazole (PRILOSEC) 20 MG capsule, Take 1 capsule (20 mg total) by mouth daily., Disp: 30 capsule, Rfl: 5 .  pravastatin (PRAVACHOL) 80 MG tablet, Take 1 tablet (80 mg total) by mouth daily., Disp: 30 tablet, Rfl: 5 .  pregabalin (LYRICA) 300 MG capsule, Take 1 capsule by mouth 2 (two) times daily., Disp: , Rfl:  .  ranitidine (ZANTAC) 150 MG tablet, Take 150 mg by mouth at bedtime., Disp: , Rfl:  .  sertraline (ZOLOFT) 100 MG tablet, Take 1.5 tablets (150 mg total) by mouth daily., Disp: 45 tablet, Rfl: 2 .  TOUJEO SOLOSTAR 300 UNIT/ML SOPN, INJECT 60 UNITS PER DAY INTO THE SKIN ONCE DAILY, Disp: 6 pen, Rfl: 0 .  VITAMIN D, CHOLECALCIFEROL, PO, Take 20,000 Units by mouth daily., Disp: , Rfl:  .  zolpidem (AMBIEN) 10 MG tablet, Take 1 tablet (10 mg total) by mouth at bedtime., Disp: 30 tablet, Rfl: 2  Allergies  Allergen  Reactions  . Atorvastatin     muscle pain  . Codeine   . Latex Rash     ROS  Constitutional: Negative for fever or weight change.  Respiratory: Negative for cough positive shortness of breath.   Cardiovascular: Negative for chest pain or palpitations.  Gastrointestinal: Negative for abdominal pain, no bowel changes.  Musculoskeletal: Positive for gait problem no  joint swelling.  Skin: Negative for rash.  Neurological: Positive  for occasional dizziness or headache.  No other specific complaints in a complete review of systems (except as listed in HPI above).  Objective  Filed Vitals:   05/24/15 1403  BP: 116/60  Pulse: 95  Temp: 98 F (36.7 C)  TempSrc: Oral  Resp: 14  Height: 5\' 4"  (1.626 m)  Weight: 220 lb 4.8 oz (99.927 kg)  SpO2: 95%    Body mass index is 37.8 kg/(m^2).  Physical Exam  Constitutional: Patient appears well-developed and well-nourished. Obese No distress.  HEENT: head atraumatic, normocephalic, pupils equal and reactive to light, neck supple, throat within normal limits Cardiovascular: Normal rate, regular rhythm and normal heart sounds.  No murmur heard. No BLE edema. Pulmonary/Chest: Effort normal and breath sounds normal. No respiratory distress. Abdominal: Soft.  There is no tenderness. Psychiatric: Patient has a normal mood and affect. behavior is normal. Judgment and thought content normal.  Recent Results (from the past 2160 hour(s))  POCT HgB A1C     Status: Abnormal   Collection Time: 04/20/15 10:25 AM  Result Value Ref Range   Hemoglobin A1C 7.5   Lipid Profile     Status: Abnormal   Collection Time: 04/21/15  8:04 AM  Result Value Ref Range   Cholesterol, Total 263 (H) 100 - 199 mg/dL   Triglycerides 882 (HH) 0 - 149 mg/dL   HDL 32 (L) >39 mg/dL    Comment: According to ATP-III Guidelines, HDL-C >59 mg/dL is considered a negative risk factor for CHD.    VLDL Cholesterol Cal Comment 5 - 40 mg/dL    Comment: The calculation for  the VLDL cholesterol is not valid when triglyceride level is >400 mg/dL.    LDL Calculated Comment 0 - 99 mg/dL    Comment: Triglyceride result indicated is too high for an accurate LDL cholesterol estimation.    Chol/HDL Ratio 8.2 (H) 0.0 - 4.4 ratio units    Comment:                                   T. Chol/HDL Ratio                                             Men  Women                               1/2 Avg.Risk  3.4    3.3                                   Avg.Risk  5.0    4.4                                2X Avg.Risk  9.6    7.1                                3X Avg.Risk 23.4   11.0   Comprehensive Metabolic Panel (CMET)     Status: Abnormal   Collection Time: 04/21/15  8:04 AM  Result Value Ref Range   Glucose 196 (H) 65 - 99 mg/dL    Comment: Specimen received in contact with cells. No visible hemolysis present. However GLUC may be decreased and K increased. Clinical correlation indicated.    BUN 25 8 - 27 mg/dL  Creatinine, Ser 1.04 (H) 0.57 - 1.00 mg/dL   GFR calc non Af Amer 56 (L) >59 mL/min/1.73   GFR calc Af Amer 65 >59 mL/min/1.73   BUN/Creatinine Ratio 24 11 - 26   Sodium 143 134 - 144 mmol/L   Potassium 4.4 3.5 - 5.2 mmol/L   Chloride 101 97 - 108 mmol/L   CO2 22 18 - 29 mmol/L   Calcium 9.9 8.7 - 10.3 mg/dL   Total Protein 6.1 6.0 - 8.5 g/dL   Albumin 4.2 3.6 - 4.8 g/dL   Globulin, Total 1.9 1.5 - 4.5 g/dL   Albumin/Globulin Ratio 2.2 1.1 - 2.5   Bilirubin Total 0.3 0.0 - 1.2 mg/dL   Alkaline Phosphatase 77 39 - 117 IU/L   AST 18 0 - 40 IU/L   ALT 22 0 - 32 IU/L  Vitamin D (25 hydroxy)     Status: Abnormal   Collection Time: 04/21/15  8:04 AM  Result Value Ref Range   Vit D, 25-Hydroxy 19.5 (L) 30.0 - 100.0 ng/mL    Comment: Vitamin D deficiency has been defined by the Institute of Medicine and an Endocrine Society practice guideline as a level of serum 25-OH vitamin D less than 20 ng/mL (1,2). The Endocrine Society went on to further define vitamin  D insufficiency as a level between 21 and 29 ng/mL (2). 1. IOM (Institute of Medicine). 2010. Dietary reference    intakes for calcium and D. [place]: The    [employer]. 2. Holick MF, Binkley [place], Bischoff-Ferrari HA, et al.    Evaluation, treatment, and prevention of vitamin D    deficiency: an Endocrine Society clinical practice    guideline. JCEM. 2011 Jul; 96(7):1911-30.   TSH     Status: None   Collection Time: 04/21/15  8:04 AM  Result Value Ref Range   TSH 2.420 0.450 - 4.500 uIU/mL  Vitamin B12     Status: Abnormal   Collection Time: 04/21/15  8:04 AM  Result Value Ref Range   Vitamin B-12 1184 (H) 211 - 946 pg/mL      PHQ2/9: Depression screen PHQ 2/9 04/20/2015  Decreased Interest 3  Down, Depressed, Hopeless 3  PHQ - 2 Score 6  Altered sleeping 3  Tired, decreased energy 3  Change in appetite 3  Feeling bad or failure about yourself  3  Trouble concentrating 3  Moving slowly or fidgety/restless 3  Suicidal thoughts 3  PHQ-9 Score 27  Difficult doing work/chores Very difficult     Fall Risk: Fall Risk  04/20/2015  Falls in the past year? No     Assessment & Plan  1. Dyspnea Multifactorial, needs to lose weight and start gradually to increase physical activity   2. Needs flu shot  - Flu vaccine HIGH DOSE PF (Fluzone High dose)  3. Chronic recurrent major depressive disorder Continue medication for now - Ambulatory referral to Psychiatry  4. Chronic kidney disease (CKD), stage III (moderate) stable  5. Dyslipidemia Triglycerides very high, we will resume Lovaza - omega-3 acid ethyl esters (LOVAZA) 1 G capsule; Take 1 capsule (1 g total) by mouth 2 (two) times daily.  Dispense: 60 capsule; Refill: 5  6. Benign hypertension Decrease dose, bp is towards low end of normal, feeling tired, occasional dizziness - losartan-hydrochlorothiazide (HYZAAR) 100-12.5 MG per tablet; Take 0.5 tablets by mouth daily.  Dispense: 30 tablet;  Refill: 5   7. Rebound headache Advised to stop taking any pain medications, drink fluids, sleep  prn for pain

## 2015-05-24 NOTE — Patient Instructions (Signed)
Analgesic Rebound Headaches °An analgesic rebound headache is a headache that returns after pain medicine (analgesic) that was taken to treat the initial headache wears off. People who suffer from tension, migraine, or cluster headaches are at risk for developing rebound headaches. Any type of primary headache can return as a rebound headache if you regularly take analgesics more than three times a week. If the cycle of rebound headaches continues, they become chronic daily headaches.  °CAUSES °Analgesics frequently associated with this problem include common over-the-counter medicines like aspirin, ibuprofen, acetaminophen, sinus relief medicines, and other medicines that contain caffeine.  Narcotic pain medicines are also a common cause of rebound headaches.  °SIGNS AND SYMPTOMS °The symptoms of rebound headaches are the same as the symptoms of your initial headache. Symptoms of specific types of headaches include: °Tension headache °· Pressure around the head. °· Dull, aching head pain. °· Pain felt over the front and sides of the head. °· Tenderness in the muscles of the head, neck and shoulders. °Migraine Headache °· Pulsing or throbbing pain on one or both sides of the head. °· Severe pain that interferes with daily activities. °· Pain that is worsened by physical activity. °· Nausea, vomiting, or both. °· Pain with exposure to bright light, loud noises, or strong smells. °· General sensitivity to bright light, loud noises, or strong smells. °· Visual changes. °· Numbness of one or both arms. °Cluster Headaches °· Severe pain that begins in or around one eye or temple. °· Redness in the eye on the same side as the pain. °· Droopy or swollen eyelid. °· One-sided head pain. °· Nausea. °· Runny nose. °· Sweaty, pale facial skin. °· Restlessness. °DIAGNOSIS  °Analgesic rebound headaches are diagnosed by reviewing your medical history. This includes the nature of your initial headaches, as well as the type of pain  medicines you have been using to treat your headaches and how often you take them. °TREATMENT °Discontinuing frequent use of the analgesic medicine will typically reduce the frequency of the rebound episodes. This may initially worsen your headaches but eventually the pain should become more manageable, less frequent, and less severe.  °Seeing a headache specialists may helpful. He or she may be able to help you manage your headaches and to make sure there is not another cause of the headaches. Alternative methods of stress relief such as acupuncture, counseling, biofeedback, and massage may also be helpful. Talk with your health care provider about which alternative treatments might be good for you. °HOME CARE INSTRUCTIONS °Stopping the regular use of pain medicine can be difficult. Follow your health care provider's instructions carefully. Keep all of your appointments. Avoid triggers that are known to cause your primary headaches. °SEEK MEDICAL CARE IF: °You continue to experience headaches after following your health care provider's recommended treatments. °SEEK IMMEDIATE MEDICAL CARE IF: °· You develop new headache pain. °· You develop headache pain that is different than what you have experienced in the past. °· You develop numbness or tingling in your arms or legs. °· You develop changes in your speech or vision. °MAKE SURE YOU: °· Understand these instructions. °· Will watch your child's condition. °· Will get help right away if your child is not doing well or gets worse. °Document Released: 11/18/2003 Document Revised: 01/12/2014 Document Reviewed: 03/13/2013 °ExitCare® Patient Information ©2015 [employer]. This information is not intended to replace advice given to you by your health care provider. Make sure you discuss any questions you have with your health care   provider. ° °

## 2015-06-01 ENCOUNTER — Ambulatory Visit (INDEPENDENT_AMBULATORY_CARE_PROVIDER_SITE_OTHER): Payer: No Typology Code available for payment source | Admitting: Licensed Clinical Social Worker

## 2015-06-01 DIAGNOSIS — F329 Major depressive disorder, single episode, unspecified: Secondary | ICD-10-CM

## 2015-06-01 DIAGNOSIS — F32A Depression, unspecified: Secondary | ICD-10-CM
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
## 2015-06-07 ENCOUNTER — Telehealth: Payer: Self-pay | Admitting: *Deleted

## 2015-06-07 NOTE — Telephone Encounter (Signed)
Patient called office asking if her shoes were in? Called patient back confirmed Summer [name] patient will have someone from that office check.

## 2015-06-15 ENCOUNTER — Other Ambulatory Visit: Payer: Self-pay | Admitting: Family Medicine

## 2015-06-23 ENCOUNTER — Telehealth: Payer: Self-pay | Admitting: Family Medicine

## 2015-06-23 NOTE — Telephone Encounter (Signed)
PATIENT IS COMING ON Thursday 06/24/15 TO SEE BETHA TO GET MEASURED FOR SHOES. Summer [name]

## 2015-06-23 NOTE — Telephone Encounter (Signed)
Is she using a voucher? What insurance does she have?

## 2015-06-23 NOTE — Telephone Encounter (Signed)
Patient states that Invokamet is costing to much and would like something different called in.

## 2015-06-24 ENCOUNTER — Ambulatory Visit: Payer: Medicare Other

## 2015-06-24 NOTE — Telephone Encounter (Signed)
She has medicare/bcbs

## 2015-06-24 NOTE — Telephone Encounter (Signed)
She needs to come in with a list of medications that are covered by her insurance so we can discuss options.

## 2015-06-29 ENCOUNTER — Ambulatory Visit (INDEPENDENT_AMBULATORY_CARE_PROVIDER_SITE_OTHER): Payer: Medicare Other | Admitting: Psychiatry

## 2015-06-29 ENCOUNTER — Encounter: Payer: Self-pay | Admitting: Psychiatry

## 2015-06-29 ENCOUNTER — Ambulatory Visit (INDEPENDENT_AMBULATORY_CARE_PROVIDER_SITE_OTHER): Payer: Medicare Other

## 2015-06-29 ENCOUNTER — Encounter: Payer: Self-pay | Admitting: Podiatry

## 2015-06-29 ENCOUNTER — Telehealth: Payer: Self-pay

## 2015-06-29 ENCOUNTER — Ambulatory Visit (INDEPENDENT_AMBULATORY_CARE_PROVIDER_SITE_OTHER): Payer: Medicare Other | Admitting: Podiatry

## 2015-06-29 VITALS — BP 122/64 | HR 97 | Temp 98.5°F | Ht 62.0 in | Wt 219.2 lb

## 2015-06-29 DIAGNOSIS — E11610 Type 2 diabetes mellitus with diabetic neuropathic arthropathy: Secondary | ICD-10-CM | POA: Diagnosis not present

## 2015-06-29 DIAGNOSIS — R52 Pain, unspecified: Secondary | ICD-10-CM

## 2015-06-29 DIAGNOSIS — F339 Major depressive disorder, recurrent, unspecified: Secondary | ICD-10-CM | POA: Diagnosis not present

## 2015-06-29 DIAGNOSIS — M21961 Unspecified acquired deformity of right lower leg: Secondary | ICD-10-CM | POA: Diagnosis not present

## 2015-06-29 DIAGNOSIS — F331 Major depressive disorder, recurrent, moderate: Secondary | ICD-10-CM | POA: Diagnosis not present

## 2015-06-29 MED ORDER — SERTRALINE HCL 100 MG PO TABS: 100.0000 mg | ORAL_TABLET | Freq: Every day | ORAL | Status: DC

## 2015-06-29 MED ORDER — LAMOTRIGINE 25 MG PO TABS: 25.0000 mg | ORAL_TABLET | Freq: Every day | ORAL | Status: DC

## 2015-06-29 MED ORDER — AMITRIPTYLINE HCL 25 MG PO TABS: 25.0000 mg | ORAL_TABLET | Freq: Every day | ORAL | Status: DC

## 2015-06-29 NOTE — Patient Instructions (Addendum)
For a second opinion please contact:  Dr. Selene G. Parekh, MD [address]  [place], [place] [address] [phone]  

## 2015-06-29 NOTE — Telephone Encounter (Signed)
Patient is getting help with medication through [employer] and needs refill of Lantus, due to Insurance not covering as much as patient could afford on Toujeo. Put in medication into patient chart and faxed refill request to [employer] for patient and notified her by phone.

## 2015-06-29 NOTE — Progress Notes (Signed)
Psychiatric Initial Adult Assessment   Patient Identification: ARCHANA ECKMAN MRN:  [id number] Date of Evaluation:  06/29/2015 Referral Source: Dr Ancil Boozer- PCP  Chief Complaint:   Chief Complaint    Establish Care; Depression     Visit Diagnosis:    ICD-9-CM ICD-10-CM   1. MDD (major depressive disorder), recurrent episode, moderate ([name]) 296.32 F33.1    Diagnosis:   Patient Active Problem List   Diagnosis Date Noted  . Dyslipidemia [E78.5] 05/24/2015  . Carpal tunnel syndrome [G56.00] 04/17/2015  . Chronic constipation [K59.00] 04/17/2015  . Insomnia, persistent [G47.00] 04/17/2015  . Chronic kidney disease (CKD), stage III (moderate) [N18.3] 04/17/2015  . Decreased exercise tolerance [R68.89] 04/17/2015  . Diabetes mellitus with polyneuropathy ([place]) [E11.42] 04/17/2015  . Gastro-esophageal reflux disease without esophagitis [K21.9] 04/17/2015  . Bursitis, trochanteric [M70.60] 04/17/2015  . Cephalalgia [R51] 04/17/2015  . Benign hypertension [I10] 04/17/2015  . Adult hypothyroidism [E03.9] 04/17/2015  . Hearing loss [H91.90] 04/17/2015  . Chronic recurrent major depressive disorder ([place]) [F33.9] 04/17/2015  . Dyslipidemia [E78.5] 04/17/2015  . Neurogenic claudication [M48.06] 04/17/2015  . Obesity (BMI 35.0-39.9 without comorbidity) ([place]) [E66.9] 04/17/2015  . Hypo-ovarianism [E28.39] 04/17/2015  . Perennial allergic rhinitis with seasonal variation [J30.9] 04/17/2015  . Acne erythematosa [L71.9] 04/17/2015  . Dyskinesia, tardive [G24.01] 04/17/2015  . Memory loss [R41.3] 04/17/2015  . Impingement syndrome of shoulder [M75.40] 04/17/2015  . Dermatitis, stasis [I83.10] 04/17/2015  . Obstructive sleep apnea [G47.33] 05/14/2014  . Dyspnea [R06.00] 05/06/2014  . Hyperlipidemia [E78.5] 02/06/2012  . LBP (low back pain) [M54.5] 09/16/2008   History of Present Illness:    Patient is a 64 year old female who was referred by her primary care physician Dr. Ancil Boozer. Patient  presented for initial assessment. She reported that she has a long history of depression fatigue and headache which is getting worse for the past couple of months. Patient stated that she has been following her primary care physician has started her on Zoloft and Ambien but she is unable to sleep. She reported that she is also concerned about her upcoming foot surgery as a doctors has told her that she will not be able to work after the surgery for almost a year. 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 Patient reported that she used to have the bar for 8 years along with her first husband but then they sold   the bar and she reopened  again approximately 4 years ago. Patient reported that she does not drink alcohol at this time. She stands there and has been providing services to the local community. Patient stated that she is concerned about her upcoming surgery and has been feeling anxious and is unable to sleep due to the same reason. She has been started on Ambien but it is only making her tired and not providing with a good night's sleep. She was also started on Zoloft recently by Dr. Ancil Boozer  but it did not help. Patient stated that she wants to have her medications adjusted. She loses temper quickly and has been getting at herself. She denied having any suicidal homicidal ideations or plans. She appeared calm and cooperative during the interview. Elements:  Location:  Depression and anxiety. Associated Signs/Symptoms: Depression Symptoms:  depressed mood, anhedonia, psychomotor retardation, fatigue, feelings of worthlessness/guilt, difficulty concentrating, anxiety, loss of energy/fatigue, weight gain, (Hypo) Manic Symptoms:  Distractibility, Impulsivity, Irritable Mood, Labiality of Mood, Anxiety Symptoms:  Excessive Worry,  Psychotic Symptoms:  none PTSD Symptoms: Negative NA  Past Medical History:  Past Medical History   Diagnosis Date  . Diabetes mellitus   . Hypertension   . Chronic kidney disease   . Hyperlipidemia   . Depressive disorder   . Rosacea   . Memory loss   . Unspecified hereditary and idiopathic peripheral neuropathy   . Obesity   . Rhinitis, allergic   . Asthma   . Insomnia   . Unspecified sleep apnea   . 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)   . Unspecified hearing loss   . Carpal tunnel syndrome   . Lumbago   . Thyroid disease     Past Surgical History  Procedure Laterality Date  . Tubal ligation    . Vaginal hysterectomy  1989  . Neck surgery    . Cataract extraction  01/2011    right  . Eye lid surgery  2013    bilateral   Family History:  Family History  Problem Relation Age of Onset  . Heart attack Mother   . Aneurysm Mother    Social History:   Social History   Social History  . Marital Status: Widowed    Spouse Name: N/A  . Number of Children: N/A  . Years of Education: N/A   Social History Main Topics  . Smoking status: Never Smoker   . Smokeless tobacco: Never Used  . Alcohol Use: 0.0 - 0.6 oz/week    0 Standard drinks or equivalent, 0-1 Glasses of wine per week  . Drug Use: No  . Sexual Activity:    Partners: Male   Other Topics Concern  . None   Social History Narrative   Additional Social History:  Patient currently lives by herself and has a close friend who supports her. She stated that she was married twice in the past.  Musculoskeletal: Strength & Muscle Tone: within normal limits Gait & Station: having problem with foot Patient leans: N/A  Psychiatric Specialty Exam: HPI  ROS  Blood pressure 122/64, pulse 97, temperature 98.5 F (36.9 C), temperature source Tympanic, height 5\' 2"  (1.575 m), weight 219 lb 3.2 oz (99.428 kg), SpO2 93 %.Body mass index is 40.08 kg/(m^2).  General Appearance: Fairly Groomed  Eye Contact:  Fair  Speech:  Clear and Coherent and Normal Rate  Volume:  Normal  Mood:  Anxious  Affect:  Congruent  Thought  Process:  Coherent and Goal Directed  Orientation:  Full (Time, Place, and Person)  Thought Content:  WDL  Suicidal Thoughts:  No  Homicidal Thoughts:  No  Memory:  Immediate;   Fair  Judgement:  Fair  Insight:  Fair  Psychomotor Activity:  Normal  Concentration:  Fair  Recall:  [employer] of Knowledge:Fair  Language: Fair  Akathisia:  No  Handed:  Right  AIMS (if indicated):    Assets:  Communication Skills Desire for Improvement Physical Health Talents/Skills  ADL's:  Intact  Cognition: WNL  Sleep:  4-5   Is the patient at risk to self?  No. Has the patient been a risk to self in the past 6 months?  No. Has the patient been a risk to self within the distant past?  No. Is the patient a risk to others?  No. Has the patient been a risk to others in the past 6 months?  No. Has the patient been a risk to others within the distant past?  No.  Allergies:   Allergies  Allergen Reactions  . Atorvastatin  muscle pain  . Codeine   . Latex Rash   Current Medications: Current Outpatient Prescriptions  Medication Sig Dispense Refill  . aspirin 81 MG tablet Take 81 mg by mouth daily.    . Canagliflozin-Metformin HCl (INVOKAMET) 150-500 MG TABS Take 1 tablet by mouth 2 (two) times daily.    . Cyanocobalamin 2500 MCG CHEW Chew by mouth daily.    . diclofenac sodium (VOLTAREN) 1 % GEL Apply topically as needed.    . Exenatide ER (BYDUREON) 2 MG PEN Inject 1 pen into the skin once a week.    Marland Kitchen glucose blood test strip     . GLUCOSE BLOOD VI     . Insulin Pen Needle (RELION PEN NEEDLE 31G/8MM) 31G X 8 MM MISC by Does not apply route 2 (two) times daily.    Marland Kitchen levothyroxine (SYNTHROID, LEVOTHROID) 25 MCG tablet TAKE ONE TABLET BY MOUTH ONCE DAILY 90 tablet 0  . loratadine (CLARITIN) 10 MG tablet Take 1 tablet (10 mg total) by mouth daily. 30 tablet 5  . losartan-hydrochlorothiazide (HYZAAR) 100-12.5 MG per tablet Take 0.5 tablets by mouth daily. 30 tablet 5  . metoprolol tartrate  (LOPRESSOR) 25 MG tablet TAKE ONE TABLET BY MOUTH TWICE DAILY 60 tablet 5  . montelukast (SINGULAIR) 10 MG tablet Take 1 tablet by mouth daily.    Marland Kitchen omega-3 acid ethyl esters (LOVAZA) 1 G capsule Take 1 capsule (1 g total) by mouth 2 (two) times daily. 60 capsule 5  . omega-3 acid ethyl esters (LOVAZA) 1 G capsule Take 1 capsule (1 g total) by mouth 2 (two) times daily. 60 capsule 5  . omeprazole (PRILOSEC) 20 MG capsule Take 1 capsule (20 mg total) by mouth daily. 30 capsule 5  . pravastatin (PRAVACHOL) 80 MG tablet Take 1 tablet (80 mg total) by mouth daily. 30 tablet 5  . pregabalin (LYRICA) 300 MG capsule Take 1 capsule by mouth 2 (two) times daily.    . ranitidine (ZANTAC) 150 MG tablet Take 150 mg by mouth at bedtime.    . sertraline (ZOLOFT) 100 MG tablet Take 1.5 tablets (150 mg total) by mouth daily. 45 tablet 2  . TOUJEO SOLOSTAR 300 UNIT/ML SOPN INJECT 60 UNITS PER DAY INTO THE SKIN ONCE DAILY 6 pen 0  . VITAMIN D, CHOLECALCIFEROL, PO Take 20,000 Units by mouth daily.    Marland Kitchen zolpidem (AMBIEN) 10 MG tablet Take 1 tablet (10 mg total) by mouth at bedtime. 30 tablet 2   No current facility-administered medications for this visit.    Previous Psychotropic Medications: Trazodone Step Father- Alcoholic Pt denied use of Alcohol   Substance Abuse History in the last 12 months:  No.  Consequences of Substance Abuse: Negative NA  Medical Decision Making:  Review of Psycho-Social Stressors (1)  Treatment Plan Summary: Medication management   Depression Continued Zoloft 100 mg by mouth daily  Insomnia Started her on amitriptyline 25 mg by mouth daily at bedtime  Mood symptoms started her on lamotrigine 25 mg by mouth daily  Follow-up appointment in 1 month or earlier depending on her symptoms   More than 50% of the time spent in psychoeducation, counseling and coordination of care.    This note was generated in part or whole with voice recognition software. Voice regonition  is usually quite accurate but there are transcription errors that can and very often do occur. I apologize for any typographical errors that were not detected and corrected.     Rainey Pines 10/18/20162:10  PM

## 2015-06-30 NOTE — Telephone Encounter (Signed)
Patient has appointment scheduled for November.

## 2015-07-01 NOTE — Progress Notes (Signed)
Patient ID: Summer [name], female   DOB: June 29, 1951, 64 y.o.   MRN: [id number]  Subjective: 64 year old female present for the office they and she would like to discuss surgery for her left foot. She states that her left foot has continued to flatten and turning outwards. She denies a pain associated with the foot although she has tried have to walk with a cane or a walker due to her foot. She states the wounds that she had previously of healed and she has not noticed any new ulcerations form. She denies any recent injury or trauma. Denies any swelling or warmth of the foot. No other complaints at this time in no acute changes since last appointment.  Objective: AAO 3, NAD DP/PT pulses palpable, CRT less than 3 seconds Protective sensation decreased with Simms Weinstein monofilament There is a significant decrease in medial arch height upon weightbearing there is prominence of the medial talus on the plantar medial aspect of the foot. There is forefoot abduction present. There is no range of motion of the subtalar joint. Ankle joint range of motion is intact. Midtarsal joint is limited in motion. Hammertoe contractures are present at digits 1-5. There pre-ulcer lesions areas of erythema on the medial aspect of the foot from high pressure points however there is no skin breakdown this time. There are no other open lesions or pre-ulcerative lesions. There is no pain with calf compression, swelling, warmth, erythema to bilateral lower extremity. There is no overlying edema, erythema, increase in warmth bilateral lower extremities.  Assessment: 64 year old female with left foot deformity causing a changing gait and ulcerations  Plan: -X-rays were obtained and reviewed with the patient.  -Treatment options discussed including all alternatives, risks, and complications -I discussed both conservative and surgical treatment options. Conservative options are discussed with her as possible bracing. I discussed  with her surgical intervention as well. I discussed with her possible triple arthrodesis. I discussed with her the postoperative course as well. I also discussed a second opinion with her. I referred her to [facility] if she would like. She will consider her options. Follow-up with me in the near future. Call with any questions or concerns.  [name], DPM

## 2015-07-08 ENCOUNTER — Telehealth: Payer: Self-pay | Admitting: Podiatry

## 2015-07-08 NOTE — Telephone Encounter (Signed)
Pt calling to see if we had gotten her xray copies for her to take to her second opinion. Please call pt and let her know status.

## 2015-07-12 DIAGNOSIS — M216X9 Other acquired deformities of unspecified foot: Secondary | ICD-10-CM | POA: Insufficient documentation

## 2015-07-12 DIAGNOSIS — E1140 Type 2 diabetes mellitus with diabetic neuropathy, unspecified: Secondary | ICD-10-CM | POA: Insufficient documentation

## 2015-07-12 DIAGNOSIS — E1165 Type 2 diabetes mellitus with hyperglycemia: Secondary | ICD-10-CM

## 2015-07-12 DIAGNOSIS — M19079 Primary osteoarthritis, unspecified ankle and foot: Secondary | ICD-10-CM | POA: Insufficient documentation

## 2015-07-13 ENCOUNTER — Ambulatory Visit (INDEPENDENT_AMBULATORY_CARE_PROVIDER_SITE_OTHER): Payer: No Typology Code available for payment source | Admitting: Licensed Clinical Social Worker

## 2015-07-13 ENCOUNTER — Ambulatory Visit: Payer: Self-pay | Admitting: Licensed Clinical Social Worker

## 2015-07-13 DIAGNOSIS — F331 Major depressive disorder, recurrent, moderate: Secondary | ICD-10-CM | POA: Diagnosis not present

## 2015-07-14 NOTE — Telephone Encounter (Signed)
SPOKE Summer [name] ABOUT THE PATIENT AND Summer [name] STATED THAT THE DISK HAS BEEN HERE AND THE PATIENT HAS ALREADY WENT TO HER APPOINTMENT. Summer [name] HAD CALLED TWICE TO LET PATIENT KNOW THAT IT WAS HERE. Alethia Melendrez

## 2015-07-14 NOTE — Telephone Encounter (Signed)
07/14/2015- i talked to the patient on 07/08/15, and she is going to stop by the office and pick up the disk. Summer [name]

## 2015-07-15 NOTE — Progress Notes (Signed)
   THERAPIST PROGRESS NOTE  Session Time: 61min  Participation Level: Active  Behavioral Response: CasualAlertDepressed  Type of Therapy: Individual Therapy  Treatment Goals addressed: Coping  Interventions: CBT, Motivational Interviewing, Solution Focused, Strength-based, Family Systems and Reframing  Summary: Summer [name] is a 64 y.o. female who presents with continued symptoms of her diagnosis.  She was able to vent her frustrations with herself and her current medical concerns.  She will receive outpatient surgery in about 3 weeks to assist with alleviating pain.  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  She continues to cry often and have difficulty with sleep.   Suicidal/Homicidal: Nowithout intent/plan  Therapist Response: Assessed mood.  Actively listened as she listed current stressors.  Explored thoughts and feelings regarding financial stressors while out for surgery.  Provided her with a Engineer, production while she is unable to attend session.  Plan: Return again in after surgery weeks.  Diagnosis: Axis I: Major Depression    Axis II: No diagnosis    Lubertha South 07/15/2015

## 2015-07-20 ENCOUNTER — Ambulatory Visit (INDEPENDENT_AMBULATORY_CARE_PROVIDER_SITE_OTHER): Payer: Medicare Other | Admitting: Family Medicine

## 2015-07-20 ENCOUNTER — Encounter: Payer: Self-pay | Admitting: Family Medicine

## 2015-07-20 VITALS — BP 120/84 | HR 99 | Temp 97.9°F | Resp 16 | Ht 62.0 in | Wt 219.5 lb

## 2015-07-20 DIAGNOSIS — W5501XA Bitten by cat, initial encounter: Secondary | ICD-10-CM | POA: Diagnosis not present

## 2015-07-20 DIAGNOSIS — E1142 Type 2 diabetes mellitus with diabetic polyneuropathy: Secondary | ICD-10-CM

## 2015-07-20 DIAGNOSIS — E785 Hyperlipidemia, unspecified: Secondary | ICD-10-CM | POA: Diagnosis not present

## 2015-07-20 DIAGNOSIS — I10 Essential (primary) hypertension: Secondary | ICD-10-CM

## 2015-07-20 DIAGNOSIS — K219 Gastro-esophageal reflux disease without esophagitis: Secondary | ICD-10-CM

## 2015-07-20 DIAGNOSIS — Z79899 Other long term (current) drug therapy: Secondary | ICD-10-CM

## 2015-07-20 DIAGNOSIS — F339 Major depressive disorder, recurrent, unspecified: Secondary | ICD-10-CM

## 2015-07-20 DIAGNOSIS — Z23 Encounter for immunization: Secondary | ICD-10-CM | POA: Diagnosis not present

## 2015-07-20 LAB — POCT GLYCOSYLATED HEMOGLOBIN (HGB A1C): Hemoglobin A1C: 7.6

## 2015-07-20 MED ORDER — OMEPRAZOLE 40 MG PO CPDR: 40.0000 mg | DELAYED_RELEASE_CAPSULE | Freq: Every day | ORAL | Status: DC

## 2015-07-20 MED ORDER — CANAGLIFLOZIN 300 MG PO TABS: 300.0000 mg | ORAL_TABLET | Freq: Every day | ORAL | Status: DC

## 2015-07-20 MED ORDER — ZOLPIDEM TARTRATE 10 MG PO TABS: 10.0000 mg | ORAL_TABLET | Freq: Every evening | ORAL | Status: DC | PRN

## 2015-07-20 MED ORDER — METFORMIN HCL 850 MG PO TABS: 850.0000 mg | ORAL_TABLET | Freq: Two times a day (BID) | ORAL | Status: DC

## 2015-07-20 MED ORDER — AMOXICILLIN-POT CLAVULANATE 875-125 MG PO TABS
1.0000 | ORAL_TABLET | Freq: Two times a day (BID) | ORAL | Status: DC
Start: 2015-07-20 — End: 2015-09-07

## 2015-07-20 NOTE — Progress Notes (Addendum)
Name: Summer [name]   MRN: [id number]    DOB: 1951/03/30   Date:07/20/2015       Progress Note  Subjective  Chief Complaint  Chief Complaint  Patient presents with  . Medication Management  . Diabetes    Checks every am, Low-100 Average-150 High-200, Would like to discuss changing Invokanamet due to cost  . Hypertension  . Gastroesophageal Reflux    Having trouble with feeling like her acid is trapped under her breast, belching frequently.  . Hand Pain    Cat bit patient left hand-middle finger yesterday during a seizure. Cat is all up to date with it's shot and goes to the veterinarian regularly but it 12 and experiences seizures frequently. Patient states her finger has been bleeding alot and swollen but has been putting neosporin on it.    HPI  DM: glucose is stable but not at goal, hgbA1C still above 7.4 . She denies polyphagia, polydipsia and occasional polyuria. Needs to change from Invokamet to Invokana and Metformin  HTN: bp is at goal, denies side effects of medication, no chest pain or palpitation  Finger laceration: bit by her own cat ( shots up to date ), yesterday, she cleaned the area with soap and water and applied Neosporin, denies pain at this time, still bleeding.   GERD: she has been noticing indigestion that can last all day at times, usually on left side of chest, seen by Cardiologist last year and had a normal work up.   Patient Active Problem List   Diagnosis Date Noted  . Acquired abduction deformity of foot 07/12/2015  . Osteoarthritis of subtalar joint 07/12/2015  . Polyneuropathy in diseases classified elsewhere ([place]) 07/12/2015  . Arthritis of foot, degenerative 07/12/2015  . Carpal tunnel syndrome 04/17/2015  . Chronic constipation 04/17/2015  . Insomnia, persistent 04/17/2015  . Chronic kidney disease (CKD), stage III (moderate) 04/17/2015  . Decreased exercise tolerance 04/17/2015  . Diabetes mellitus with polyneuropathy ([place]) 04/17/2015  .  Gastro-esophageal reflux disease without esophagitis 04/17/2015  . Bursitis, trochanteric 04/17/2015  . Cephalalgia 04/17/2015  . Benign hypertension 04/17/2015  . Adult hypothyroidism 04/17/2015  . Hearing loss 04/17/2015  . Chronic recurrent major depressive disorder ([place]) 04/17/2015  . Neurogenic claudication 04/17/2015  . Obesity (BMI 35.0-39.9 without comorbidity) ([place]) 04/17/2015  . Hypo-ovarianism 04/17/2015  . Perennial allergic rhinitis with seasonal variation 04/17/2015  . Acne erythematosa 04/17/2015  . Dyskinesia, tardive 04/17/2015  . Memory loss 04/17/2015  . Impingement syndrome of shoulder 04/17/2015  . Dermatitis, stasis 04/17/2015  . Obstructive sleep apnea 05/14/2014  . Dyspnea 05/06/2014  . Hyperlipidemia 02/06/2012  . LBP (low back pain) 09/16/2008    Past Surgical History  Procedure Laterality Date  . Tubal ligation    . Vaginal hysterectomy  1989  . Neck surgery    . Cataract extraction  01/2011    right  . Eye lid surgery  2013    bilateral    Family History  Problem Relation Age of Onset  . Heart attack Mother   . Aneurysm Mother     Social History   Social History  . Marital Status: Widowed    Spouse Name: N/A  . Number of Children: N/A  . Years of Education: N/A   Occupational History  . Not on file.   Social History Main Topics  . Smoking status: Never Smoker   . Smokeless tobacco: Never Used  . Alcohol Use: 0.0 - 0.6 oz/week    0 Standard drinks  or equivalent, 0-1 Glasses of wine per week  . Drug Use: No  . Sexual Activity:    Partners: Male   Other Topics Concern  . Not on file   Social History Narrative     Current outpatient prescriptions:  .  amitriptyline (ELAVIL) 25 MG tablet, Take 1 tablet (25 mg total) by mouth at bedtime., Disp: 30 tablet, Rfl: 1 .  aspirin 81 MG tablet, Take 81 mg by mouth daily., Disp: , Rfl:  .  Cyanocobalamin 2500 MCG CHEW, Chew by mouth daily., Disp: , Rfl:  .  diclofenac sodium (VOLTAREN)  1 % GEL, Apply topically as needed., Disp: , Rfl:  .  Exenatide ER (BYDUREON) 2 MG PEN, Inject 1 pen into the skin once a week., Disp: , Rfl:  .  Glucose Blood (BLOOD GLUCOSE TEST STRIPS) STRP, , Disp: , Rfl:  .  glucose blood test strip, , Disp: , Rfl:  .  GLUCOSE BLOOD VI, , Disp: , Rfl:  .  insulin glargine (LANTUS) 100 UNIT/ML injection, Inject 60 Units into the skin at bedtime., Disp: , Rfl:  .  Insulin Pen Needle (RELION PEN NEEDLE 31G/8MM) 31G X 8 MM MISC, by Does not apply route 2 (two) times daily., Disp: , Rfl:  .  lamoTRIgine (LAMICTAL) 25 MG tablet, Take 1 tablet (25 mg total) by mouth daily., Disp: 30 tablet, Rfl: 1 .  levothyroxine (SYNTHROID, LEVOTHROID) 25 MCG tablet, TAKE ONE TABLET BY MOUTH ONCE DAILY, Disp: 90 tablet, Rfl: 0 .  losartan-hydrochlorothiazide (HYZAAR) 100-12.5 MG per tablet, Take 0.5 tablets by mouth daily., Disp: 30 tablet, Rfl: 5 .  metoprolol tartrate (LOPRESSOR) 25 MG tablet, TAKE ONE TABLET BY MOUTH TWICE DAILY, Disp: 60 tablet, Rfl: 5 .  montelukast (SINGULAIR) 10 MG tablet, Take 1 tablet by mouth daily., Disp: , Rfl:  .  omega-3 acid ethyl esters (LOVAZA) 1 G capsule, Take 1 capsule (1 g total) by mouth 2 (two) times daily., Disp: 60 capsule, Rfl: 5 .  omega-3 acid ethyl esters (LOVAZA) 1 G capsule, Take 1 capsule (1 g total) by mouth 2 (two) times daily., Disp: 60 capsule, Rfl: 5 .  omeprazole (PRILOSEC) 20 MG capsule, Take 1 capsule (20 mg total) by mouth daily., Disp: 30 capsule, Rfl: 5 .  pravastatin (PRAVACHOL) 80 MG tablet, Take 1 tablet (80 mg total) by mouth daily., Disp: 30 tablet, Rfl: 5 .  pregabalin (LYRICA) 300 MG capsule, Take 1 capsule by mouth 2 (two) times daily., Disp: , Rfl:  .  ranitidine (ZANTAC) 150 MG tablet, Take 150 mg by mouth at bedtime., Disp: , Rfl:  .  sertraline (ZOLOFT) 100 MG tablet, Take 1 tablet (100 mg total) by mouth daily., Disp: 30 tablet, Rfl: 2 .  VITAMIN D, CHOLECALCIFEROL, PO, Take 20,000 Units by mouth daily.,  Disp: , Rfl:  .  canagliflozin (INVOKANA) 300 MG TABS tablet, Take 300 mg by mouth daily before breakfast., Disp: 30 tablet, Rfl: 5 .  metFORMIN (GLUCOPHAGE) 850 MG tablet, Take 1 tablet (850 mg total) by mouth 2 (two) times daily with a meal., Disp: 180 tablet, Rfl: 1 .  zolpidem (AMBIEN) 10 MG tablet, Take 1 tablet (10 mg total) by mouth at bedtime as needed for sleep., Disp: 30 tablet, Rfl: 0  Allergies  Allergen Reactions  . Atorvastatin     muscle pain  . Codeine   . Latex Rash     ROS  Constitutional: Negative for fever or weight change.  Respiratory: Negative for cough  and shortness of breath.   Cardiovascular: Negative for chest pain or palpitations.  Gastrointestinal: Negative for abdominal pain, no bowel changes.  Musculoskeletal: Negative for gait problem or joint swelling.  Skin: laceration finger from cat bite Neurological: Negative for dizziness or headache.  No other specific complaints in a complete review of systems (except as listed in HPI above).  Objective  Filed Vitals:   07/20/15 1052  BP: 120/84  Pulse: 99  Temp: 97.9 F (36.6 C)  TempSrc: Oral  Resp: 16  Height: 5\' 2"  (1.575 m)  Weight: 219 lb 8 oz (99.565 kg)  SpO2: 95%    Body mass index is 40.14 kg/(m^2).  Physical Exam  Constitutional: Patient appears well-developed and well-nourished. Obese No distress.  HEENT: head atraumatic, normocephalic, pupils equal and reactive to light, neck supple, throat within normal limits Cardiovascular: Normal rate, regular rhythm and normal heart sounds.  No murmur heard. No BLE edema. Pulmonary/Chest: Effort normal and breath sounds normal. No respiratory distress. Abdominal: Soft.  There is no tenderness. Psychiatric: Patient has a normal mood and affect. behavior is normal. Judgment and thought content normal. Skin: left middle finger erythematous with no skin over the tip of her finger, no significant tenderness during palpation, good perfusion to nail  bed and finger, no significant swelling  Recent Results (from the past 2160 hour(s))  POCT HgB A1C     Status: None   Collection Time: 07/20/15 10:53 AM  Result Value Ref Range   Hemoglobin A1C 7.6     PHQ2/9: Depression screen [facility] 2/9 07/20/2015 04/20/2015  Decreased Interest 0 3  Down, Depressed, Hopeless 1 3  PHQ - 2 Score 1 6  Altered sleeping - 3  Tired, decreased energy - 3  Change in appetite - 3  Feeling bad or failure about yourself  - 3  Trouble concentrating - 3  Moving slowly or fidgety/restless - 3  Suicidal thoughts - 3  PHQ-9 Score - 27  Difficult doing work/chores - Very difficult    Fall Risk: Fall Risk  07/20/2015 04/20/2015  Falls in the past year? Yes No  Number falls in past yr: 1 -  Injury with Fall? No -     Functional Status Survey: Is the patient deaf or have difficulty hearing?: No Does the patient have difficulty seeing, even when wearing glasses/contacts?: Yes (glasses) Does the patient have difficulty concentrating, remembering, or making decisions?: No Does the patient have difficulty walking or climbing stairs?: No Does the patient have difficulty dressing or bathing?: No Does the patient have difficulty doing errands alone such as visiting a doctor's office or shopping?: No   Assessment & Plan  1. Diabetic polyneuropathy associated with type 2 diabetes mellitus ([place])  Change from Invokamet to Invokana and higher dose of Metformin because of cost, and recheck hgbA1C in 3 months - POCT HgB A1C - POCT UA - Microalbumin - canagliflozin (INVOKANA) 300 MG TABS tablet; Take 300 mg by mouth daily before breakfast.  Dispense: 30 tablet; Refill: 5 - metFORMIN (GLUCOPHAGE) 850 MG tablet; Take 1 tablet (850 mg total) by mouth 2 (two) times daily with a meal.  Dispense: 180 tablet; Refill: 1  2. Benign hypertension  At goal   3. Hyperlipidemia  Recheck lipid today  4. Chronic recurrent major depressive disorder ([place])  Continue follow up with  psychiatrist, had some medication changed  5. Cat bite, initial encounter  Discussed if gets black, worsening of symptoms go to [facility], otherwise follow up in 2 days -  amoxicillin-clavulanate (AUGMENTIN) 875-125 MG tablet; Take 1 tablet by mouth 2 (two) times daily.  Dispense: 14 tablet; Refill: 0  6. Gastro-esophageal reflux disease without esophagitis  Increase dose of Omeprazole - omeprazole (PRILOSEC) 40 MG capsule; Take 1 capsule (40 mg total) by mouth daily.  Dispense: 30 capsule; Refill: 5  7. Need for Tdap vaccination  - Tdap vaccine greater than or equal to 7yo IM

## 2015-07-22 ENCOUNTER — Encounter: Payer: Self-pay | Admitting: Family Medicine

## 2015-07-22 ENCOUNTER — Ambulatory Visit (INDEPENDENT_AMBULATORY_CARE_PROVIDER_SITE_OTHER): Payer: Medicare Other | Admitting: Family Medicine

## 2015-07-22 ENCOUNTER — Ambulatory Visit: Payer: Medicare Other

## 2015-07-22 VITALS — BP 118/68 | HR 97 | Temp 98.3°F | Resp 16 | Wt 218.6 lb

## 2015-07-22 DIAGNOSIS — M21961 Unspecified acquired deformity of right lower leg: Secondary | ICD-10-CM

## 2015-07-22 DIAGNOSIS — W5501XD Bitten by cat, subsequent encounter: Secondary | ICD-10-CM | POA: Diagnosis not present

## 2015-07-22 DIAGNOSIS — Z872 Personal history of diseases of the skin and subcutaneous tissue: Secondary | ICD-10-CM | POA: Diagnosis not present

## 2015-07-22 DIAGNOSIS — E1142 Type 2 diabetes mellitus with diabetic polyneuropathy: Secondary | ICD-10-CM | POA: Diagnosis not present

## 2015-07-22 DIAGNOSIS — E11610 Type 2 diabetes mellitus with diabetic neuropathic arthropathy: Secondary | ICD-10-CM

## 2015-07-22 LAB — POCT UA - MICROALBUMIN: Microalbumin Ur, POC: 20 mg/L

## 2015-07-22 NOTE — Addendum Note (Signed)
Addended by: Steele Sizer F on: 07/22/2015 02:11 PM   Modules accepted: Miquel Dunn

## 2015-07-22 NOTE — Progress Notes (Signed)
Name: Summer [name]   MRN: [id number]    DOB: 1951-02-26   Date:07/22/2015       Progress Note  Subjective  Chief Complaint  Chief Complaint  Patient presents with  . Follow-up    2 day f/u on finger.  Improving with anitbiotic    HPI  Cat Bite: it happened two days ago, doing better, keeping area clean, taking antibiotics.  Pain today is minimal. No oozing or drainage, able to move all joints on her finger. No fever.   Patient Active Problem List   Diagnosis Date Noted  . Acquired abduction deformity of foot 07/12/2015  . Osteoarthritis of subtalar joint 07/12/2015  . Polyneuropathy in diseases classified elsewhere ([place]) 07/12/2015  . Arthritis of foot, degenerative 07/12/2015  . Carpal tunnel syndrome 04/17/2015  . Chronic constipation 04/17/2015  . Insomnia, persistent 04/17/2015  . Chronic kidney disease (CKD), stage III (moderate) 04/17/2015  . Decreased exercise tolerance 04/17/2015  . Diabetes mellitus with polyneuropathy ([place]) 04/17/2015  . Gastro-esophageal reflux disease without esophagitis 04/17/2015  . Bursitis, trochanteric 04/17/2015  . Cephalalgia 04/17/2015  . Benign hypertension 04/17/2015  . Adult hypothyroidism 04/17/2015  . Hearing loss 04/17/2015  . Chronic recurrent major depressive disorder ([place]) 04/17/2015  . Neurogenic claudication 04/17/2015  . Obesity (BMI 35.0-39.9 without comorbidity) ([place]) 04/17/2015  . Hypo-ovarianism 04/17/2015  . Perennial allergic rhinitis with seasonal variation 04/17/2015  . Acne erythematosa 04/17/2015  . Dyskinesia, tardive 04/17/2015  . Memory loss 04/17/2015  . Impingement syndrome of shoulder 04/17/2015  . Dermatitis, stasis 04/17/2015  . Obstructive sleep apnea 05/14/2014  . Dyspnea 05/06/2014  . Hyperlipidemia 02/06/2012  . LBP (low back pain) 09/16/2008    Past Surgical History  Procedure Laterality Date  . Tubal ligation    . Vaginal hysterectomy  1989  . Neck surgery    . Cataract extraction  01/2011    right  . Eye lid surgery  2013    bilateral    Family History  Problem Relation Age of Onset  . Heart attack Mother   . Aneurysm Mother     Social History   Social History  . Marital Status: Widowed    Spouse Name: N/A  . Number of Children: N/A  . Years of Education: N/A   Occupational History  . Not on file.   Social History Main Topics  . Smoking status: Never Smoker   . Smokeless tobacco: Never Used  . Alcohol Use: 0.0 - 0.6 oz/week    0 Standard drinks or equivalent, 0-1 Glasses of wine per week  . Drug Use: No  . Sexual Activity:    Partners: Male   Other Topics Concern  . Not on file   Social History Narrative     Current outpatient prescriptions:  .  amitriptyline (ELAVIL) 25 MG tablet, Take 1 tablet (25 mg total) by mouth at bedtime., Disp: 30 tablet, Rfl: 1 .  amoxicillin-clavulanate (AUGMENTIN) 875-125 MG tablet, Take 1 tablet by mouth 2 (two) times daily., Disp: 14 tablet, Rfl: 0 .  aspirin 81 MG tablet, Take 81 mg by mouth daily., Disp: , Rfl:  .  canagliflozin (INVOKANA) 300 MG TABS tablet, Take 300 mg by mouth daily before breakfast., Disp: 30 tablet, Rfl: 5 .  Cyanocobalamin 2500 MCG CHEW, Chew by mouth daily., Disp: , Rfl:  .  diclofenac sodium (VOLTAREN) 1 % GEL, Apply topically as needed., Disp: , Rfl:  .  Exenatide ER (BYDUREON) 2 MG PEN, Inject 1 pen into the  skin once a week., Disp: , Rfl:  .  Glucose Blood (BLOOD GLUCOSE TEST STRIPS) STRP, , Disp: , Rfl:  .  glucose blood test strip, , Disp: , Rfl:  .  GLUCOSE BLOOD VI, , Disp: , Rfl:  .  insulin glargine (LANTUS) 100 UNIT/ML injection, Inject 60 Units into the skin at bedtime., Disp: , Rfl:  .  Insulin Pen Needle (RELION PEN NEEDLE 31G/8MM) 31G X 8 MM MISC, by Does not apply route 2 (two) times daily., Disp: , Rfl:  .  INVOKAMET 150-500 MG TABS, , Disp: , Rfl: 3 .  lamoTRIgine (LAMICTAL) 25 MG tablet, Take 1 tablet (25 mg total) by mouth daily., Disp: 30 tablet, Rfl: 1 .  levothyroxine  (SYNTHROID, LEVOTHROID) 25 MCG tablet, TAKE ONE TABLET BY MOUTH ONCE DAILY, Disp: 90 tablet, Rfl: 0 .  losartan-hydrochlorothiazide (HYZAAR) 100-12.5 MG per tablet, Take 0.5 tablets by mouth daily., Disp: 30 tablet, Rfl: 5 .  metFORMIN (GLUCOPHAGE) 850 MG tablet, Take 1 tablet (850 mg total) by mouth 2 (two) times daily with a meal., Disp: 180 tablet, Rfl: 1 .  metoprolol tartrate (LOPRESSOR) 25 MG tablet, TAKE ONE TABLET BY MOUTH TWICE DAILY, Disp: 60 tablet, Rfl: 5 .  montelukast (SINGULAIR) 10 MG tablet, Take 1 tablet by mouth daily., Disp: , Rfl:  .  omega-3 acid ethyl esters (LOVAZA) 1 G capsule, Take 1 capsule (1 g total) by mouth 2 (two) times daily., Disp: 60 capsule, Rfl: 5 .  omeprazole (PRILOSEC) 20 MG capsule, , Disp: , Rfl: 2 .  omeprazole (PRILOSEC) 40 MG capsule, Take 1 capsule (40 mg total) by mouth daily., Disp: 30 capsule, Rfl: 5 .  pravastatin (PRAVACHOL) 80 MG tablet, Take 1 tablet (80 mg total) by mouth daily., Disp: 30 tablet, Rfl: 5 .  pregabalin (LYRICA) 300 MG capsule, Take 1 capsule by mouth 2 (two) times daily., Disp: , Rfl:  .  ranitidine (ZANTAC) 150 MG tablet, Take 150 mg by mouth at bedtime., Disp: , Rfl:  .  sertraline (ZOLOFT) 100 MG tablet, Take 1 tablet (100 mg total) by mouth daily., Disp: 30 tablet, Rfl: 2 .  VITAMIN D, CHOLECALCIFEROL, PO, Take 20,000 Units by mouth daily., Disp: , Rfl:  .  zolpidem (AMBIEN) 10 MG tablet, Take 1 tablet (10 mg total) by mouth at bedtime as needed for sleep., Disp: 30 tablet, Rfl: 0  Allergies  Allergen Reactions  . Atorvastatin     muscle pain  . Codeine   . Latex Rash     ROS  Constitutional: Negative for fever or weight change.  Respiratory: Positive  for cough , chronic shortness of breath.   Cardiovascular: Negative for chest pain or palpitations.  Gastrointestinal: Negative for abdominal pain, no bowel changes.  Musculoskeletal: Positive  for gait problem  Neurological: Negative for dizziness or headache.  No  other specific complaints in a complete review of systems (except as listed in HPI above).  Objective  Filed Vitals:   07/22/15 1359  BP: 118/68  Pulse: 97  Temp: 98.3 F (36.8 C)  TempSrc: Oral  Resp: 16  Weight: 218 lb 9.6 oz (99.156 kg)  SpO2: 96%    Body mass index is 39.97 kg/(m^2).  Physical Exam  Left middle finger, is looking better, still has an open sore, but good perfusion, no oozing, nail bed looks normal.   Recent Results (from the past 2160 hour(s))  POCT HgB A1C     Status: None   Collection Time: 07/20/15 10:53  AM  Result Value Ref Range   Hemoglobin A1C 7.6     PHQ2/9: Depression screen [facility] 2/9 07/20/2015 04/20/2015  Decreased Interest 0 3  Down, Depressed, Hopeless 1 3  PHQ - 2 Score 1 6  Altered sleeping - 3  Tired, decreased energy - 3  Change in appetite - 3  Feeling bad or failure about yourself  - 3  Trouble concentrating - 3  Moving slowly or fidgety/restless - 3  Suicidal thoughts - 3  PHQ-9 Score - 27  Difficult doing work/chores - Very difficult     Fall Risk: Fall Risk  07/20/2015 04/20/2015  Falls in the past year? Yes No  Number falls in past yr: 1 -  Injury with Fall? No -      Assessment & Plan  1. Cat bite, subsequent encounter  Doing well, continue current management, return if symptoms gets worse again

## 2015-07-24 LAB — COMPREHENSIVE METABOLIC PANEL
ALT: 16 IU/L (ref 0–32)
AST: 15 IU/L (ref 0–40)
Albumin/Globulin Ratio: 2.2 (ref 1.1–2.5)
Albumin: 4.3 g/dL (ref 3.6–4.8)
Alkaline Phosphatase: 88 IU/L (ref 39–117)
BUN/Creatinine Ratio: 26 (ref 11–26)
BUN: 31 mg/dL — ABNORMAL HIGH (ref 8–27)
Bilirubin Total: 0.3 mg/dL (ref 0.0–1.2)
CO2: 23 mmol/L (ref 18–29)
Calcium: 10.1 mg/dL (ref 8.7–10.3)
Chloride: 94 mmol/L — ABNORMAL LOW (ref 97–106)
Creatinine, Ser: 1.17 mg/dL — ABNORMAL HIGH (ref 0.57–1.00)
GFR calc Af Amer: 56 mL/min/{1.73_m2} — ABNORMAL LOW (ref >59)
GFR calc non Af Amer: 49 mL/min/{1.73_m2} — ABNORMAL LOW (ref >59)
Globulin, Total: 2 g/dL (ref 1.5–4.5)
Glucose: 176 mg/dL — ABNORMAL HIGH (ref 65–99)
Potassium: 4.3 mmol/L (ref 3.5–5.2)
Sodium: 137 mmol/L (ref 136–144)
Total Protein: 6.3 g/dL (ref 6.0–8.5)

## 2015-07-24 LAB — LIPID PANEL
Chol/HDL Ratio: 7.1 ratio units — ABNORMAL HIGH (ref 0.0–4.4)
Cholesterol, Total: 213 mg/dL — ABNORMAL HIGH (ref 100–199)
HDL: 30 mg/dL — ABNORMAL LOW (ref >39)
Triglycerides: 593 mg/dL (ref 0–149)

## 2015-07-27 ENCOUNTER — Other Ambulatory Visit: Payer: Self-pay | Admitting: Family Medicine

## 2015-07-27 DIAGNOSIS — E781 Pure hyperglyceridemia: Secondary | ICD-10-CM

## 2015-07-27 DIAGNOSIS — E1142 Type 2 diabetes mellitus with diabetic polyneuropathy: Secondary | ICD-10-CM

## 2015-07-29 ENCOUNTER — Ambulatory Visit (INDEPENDENT_AMBULATORY_CARE_PROVIDER_SITE_OTHER): Payer: Medicare Other | Admitting: Podiatry

## 2015-07-29 ENCOUNTER — Other Ambulatory Visit: Payer: Self-pay | Admitting: Family Medicine

## 2015-07-29 DIAGNOSIS — Z872 Personal history of diseases of the skin and subcutaneous tissue: Secondary | ICD-10-CM

## 2015-07-29 DIAGNOSIS — M21961 Unspecified acquired deformity of right lower leg: Secondary | ICD-10-CM

## 2015-07-29 DIAGNOSIS — M19079 Primary osteoarthritis, unspecified ankle and foot: Secondary | ICD-10-CM

## 2015-07-29 DIAGNOSIS — E11610 Type 2 diabetes mellitus with diabetic neuropathic arthropathy: Secondary | ICD-10-CM

## 2015-07-29 DIAGNOSIS — E1142 Type 2 diabetes mellitus with diabetic polyneuropathy: Secondary | ICD-10-CM

## 2015-07-29 MED ORDER — PREGABALIN 300 MG PO CAPS: 300.0000 mg | ORAL_CAPSULE | Freq: Two times a day (BID) | ORAL | Status: DC

## 2015-07-29 NOTE — Progress Notes (Signed)
Patient ID: Summer [name], female   DOB: 10-16-1950, 64 y.o.   MRN: [id number]  Patient presents to the office today to pick up diabetic shoes/inserts. They were fitted today. She tried them on and appear to be fitting well without any high-pressure areas or any problems. She was seen by the medical system today to pick these up. She denies any recent ulcerations or any sores to her feet. No pain. She is scheduled for surgery at [facility] for her foot deformity. She states she will follow-up with me as needed. Call with any questions or concerns.

## 2015-08-02 ENCOUNTER — Ambulatory Visit (INDEPENDENT_AMBULATORY_CARE_PROVIDER_SITE_OTHER): Payer: No Typology Code available for payment source | Admitting: Psychiatry

## 2015-08-02 ENCOUNTER — Encounter: Payer: Self-pay | Admitting: Psychiatry

## 2015-08-02 VITALS — BP 122/62 | HR 101 | Temp 97.7°F | Ht 62.0 in | Wt 218.0 lb

## 2015-08-02 DIAGNOSIS — F339 Major depressive disorder, recurrent, unspecified: Secondary | ICD-10-CM | POA: Diagnosis not present

## 2015-08-02 DIAGNOSIS — F331 Major depressive disorder, recurrent, moderate: Secondary | ICD-10-CM | POA: Diagnosis not present

## 2015-08-02 MED ORDER — LAMOTRIGINE 25 MG PO TABS: 50.0000 mg | ORAL_TABLET | Freq: Every day | ORAL | Status: DC

## 2015-08-02 MED ORDER — SERTRALINE HCL 100 MG PO TABS: 100.0000 mg | ORAL_TABLET | Freq: Every day | ORAL | Status: DC

## 2015-08-02 MED ORDER — AMITRIPTYLINE HCL 25 MG PO TABS: 25.0000 mg | ORAL_TABLET | Freq: Every day | ORAL | Status: DC

## 2015-08-02 NOTE — Progress Notes (Signed)
Psychiatric MD/NP Follow Up Note   Patient Identification: Summer [name] MRN:  [id number] Date of Evaluation:  08/02/2015 Referral Source: Dr Ancil Boozer- PCP  Chief Complaint:   Chief Complaint    Follow-up; Medication Refill     Visit Diagnosis:    ICD-9-CM ICD-10-CM   1. MDD (major depressive disorder), recurrent episode, moderate ([name]) 296.32 F33.1    Diagnosis:   Patient Active Problem List   Diagnosis Date Noted  . Hypertriglyceridemia [E78.1] 07/27/2015  . Acquired abduction deformity of foot [M21.6X9] 07/12/2015  . Osteoarthritis of subtalar joint [M19.079] 07/12/2015  . Polyneuropathy in diseases classified elsewhere ([place]) [G63] 07/12/2015  . Arthritis of foot, degenerative [M19.079] 07/12/2015  . Carpal tunnel syndrome [G56.00] 04/17/2015  . Chronic constipation [K59.00] 04/17/2015  . Insomnia, persistent [G47.00] 04/17/2015  . Chronic kidney disease (CKD), stage III (moderate) [N18.3] 04/17/2015  . Decreased exercise tolerance [R68.89] 04/17/2015  . Diabetes mellitus with polyneuropathy ([place]) [E11.42] 04/17/2015  . Gastro-esophageal reflux disease without esophagitis [K21.9] 04/17/2015  . Bursitis, trochanteric [M70.60] 04/17/2015  . Cephalalgia [R51] 04/17/2015  . Benign hypertension [I10] 04/17/2015  . Adult hypothyroidism [E03.9] 04/17/2015  . Hearing loss [H91.90] 04/17/2015  . Chronic recurrent major depressive disorder ([place]) [F33.9] 04/17/2015  . Neurogenic claudication [M48.06] 04/17/2015  . Obesity (BMI 35.0-39.9 without comorbidity) ([place]) [E66.9] 04/17/2015  . Hypo-ovarianism [E28.39] 04/17/2015  . Perennial allergic rhinitis with seasonal variation [J30.9] 04/17/2015  . Acne erythematosa [L71.9] 04/17/2015  . Dyskinesia, tardive [G24.01] 04/17/2015  . Memory loss [R41.3] 04/17/2015  . Impingement syndrome of shoulder [M75.40] 04/17/2015  . Dermatitis, stasis [I83.10] 04/17/2015  . Obstructive sleep apnea [G47.33] 05/14/2014  . Dyspnea [R06.00] 05/06/2014   . Hyperlipidemia [E78.5] 02/06/2012  . LBP (low back pain) [M54.5] 09/16/2008   Subjective:   Patient is a 64 year old female who was referred by her primary care physician Dr. Ancil Boozer. Patient presented for follow up.  She reported that  She continues to feel tired and she has not noticed any improvement in her depression and mood symptoms. She has been "fatiqued', due to Lyrica as she takes high doses related to her neuropathy. Pt stated that she continues to loose temper and she has not noticed any improvement since she was started on lamotrigine. She currently denied having any adverse effects of the medications. She is open to suggestion and would like to have the doses of the medications adjusted. She stated that she is going to have her foot surgery done  in January. She will not be able to stand for long periods of time after that. She denied having any suicidal homicidal ideations or plans. She appeared calm and cooperative during the interview. Elements:  Location:  Depression and anxiety. Associated Signs/Symptoms: Depression Symptoms:  depressed mood, anhedonia, psychomotor retardation, fatigue, feelings of worthlessness/guilt, difficulty concentrating, anxiety, loss of energy/fatigue, weight gain, (Hypo) Manic Symptoms:  Distractibility, Impulsivity, Irritable Mood, Labiality of Mood, Anxiety Symptoms:  Excessive Worry, Psychotic Symptoms:  none PTSD Symptoms: Negative NA  Past Medical History:  Past Medical History  Diagnosis Date  . Diabetes mellitus   . Hypertension   . Chronic kidney disease   . Hyperlipidemia   . Depressive disorder   . Rosacea   . Memory loss   . Unspecified hereditary and idiopathic peripheral neuropathy   . Obesity   . Rhinitis, allergic   . Asthma   . Insomnia   . Unspecified sleep apnea   . 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)   . Unspecified hearing loss   .  Carpal tunnel syndrome   . Lumbago   . Thyroid disease     Past Surgical  History  Procedure Laterality Date  . Tubal ligation    . Vaginal hysterectomy  1989  . Neck surgery    . Cataract extraction  01/2011    right  . Eye lid surgery  2013    bilateral   Family History:  Family History  Problem Relation Age of Onset  . Heart attack Mother   . Aneurysm Mother    Social History:   Social History   Social History  . Marital Status: Widowed    Spouse Name: N/A  . Number of Children: N/A  . Years of Education: N/A   Social History Main Topics  . Smoking status: Never Smoker   . Smokeless tobacco: Never Used  . Alcohol Use: 0.0 - 0.6 oz/week    0 Standard drinks or equivalent, 0-1 Glasses of wine per week  . Drug Use: No  . Sexual Activity:    Partners: Male   Other Topics Concern  . None   Social History Narrative   Additional Social History:  Patient currently lives by herself and has a close friend who supports her. She stated that she was married twice in the past.  Musculoskeletal: Strength & Muscle Tone: within normal limits Gait & Station: having problem with foot Patient leans: N/A  Psychiatric Specialty Exam: HPI   ROS   Blood pressure 122/62, pulse 101, temperature 97.7 F (36.5 C), temperature source Tympanic, height 5\' 2"  (1.575 m), weight 218 lb (98.884 kg), SpO2 96 %.Body mass index is 39.86 kg/(m^2).  General Appearance: Fairly Groomed  Eye Contact:  Fair  Speech:  Clear and Coherent and Normal Rate  Volume:  Normal  Mood:  Anxious  Affect:  Congruent  Thought Process:  Coherent and Goal Directed  Orientation:  Full (Time, Place, and Person)  Thought Content:  WDL  Suicidal Thoughts:  No  Homicidal Thoughts:  No  Memory:  Immediate;   Fair  Judgement:  Fair  Insight:  Fair  Psychomotor Activity:  Normal  Concentration:  Fair  Recall:  [employer] of Knowledge:Fair  Language: Fair  Akathisia:  No  Handed:  Right  AIMS (if indicated):    Assets:  Communication Skills Desire for Improvement Physical  Health Talents/Skills  ADL's:  Intact  Cognition: WNL  Sleep:  4-5   Is the patient at risk to self?  No. Has the patient been a risk to self in the past 6 months?  No. Has the patient been a risk to self within the distant past?  No. Is the patient a risk to others?  No. Has the patient been a risk to others in the past 6 months?  No. Has the patient been a risk to others within the distant past?  No.  Allergies:   Allergies  Allergen Reactions  . Atorvastatin     muscle pain  . Codeine   . Latex Rash   Current Medications: Current Outpatient Prescriptions  Medication Sig Dispense Refill  . amitriptyline (ELAVIL) 25 MG tablet Take 1 tablet (25 mg total) by mouth at bedtime. 30 tablet 1  . amoxicillin-clavulanate (AUGMENTIN) 875-125 MG tablet Take 1 tablet by mouth 2 (two) times daily. 14 tablet 0  . aspirin 81 MG tablet Take 81 mg by mouth daily.    . canagliflozin (INVOKANA) 300 MG TABS tablet Take 300 mg by mouth daily before breakfast. 30 tablet 5  .  Cyanocobalamin 2500 MCG CHEW Chew by mouth daily.    . diclofenac sodium (VOLTAREN) 1 % GEL Apply topically as needed.    . Exenatide ER (BYDUREON) 2 MG PEN Inject 1 pen into the skin once a week.    . Glucose Blood (BLOOD GLUCOSE TEST STRIPS) STRP     . glucose blood test strip     . GLUCOSE BLOOD VI     . insulin glargine (LANTUS) 100 UNIT/ML injection Inject 60 Units into the skin at bedtime.    . Insulin Pen Needle (RELION PEN NEEDLE 31G/8MM) 31G X 8 MM MISC by Does not apply route 2 (two) times daily.    . INVOKAMET 150-500 MG TABS   3  . lamoTRIgine (LAMICTAL) 25 MG tablet Take 1 tablet (25 mg total) by mouth daily. 30 tablet 1  . levothyroxine (SYNTHROID, LEVOTHROID) 25 MCG tablet TAKE ONE TABLET BY MOUTH ONCE DAILY 90 tablet 0  . losartan-hydrochlorothiazide (HYZAAR) 100-12.5 MG per tablet Take 0.5 tablets by mouth daily. 30 tablet 5  . metFORMIN (GLUCOPHAGE) 850 MG tablet Take 1 tablet (850 mg total) by mouth 2 (two)  times daily with a meal. 180 tablet 1  . metoprolol tartrate (LOPRESSOR) 25 MG tablet TAKE ONE TABLET BY MOUTH TWICE DAILY 60 tablet 5  . montelukast (SINGULAIR) 10 MG tablet Take 1 tablet by mouth daily.    Marland Kitchen omega-3 acid ethyl esters (LOVAZA) 1 G capsule Take 1 capsule (1 g total) by mouth 2 (two) times daily. 60 capsule 5  . omeprazole (PRILOSEC) 20 MG capsule   2  . omeprazole (PRILOSEC) 40 MG capsule Take 1 capsule (40 mg total) by mouth daily. 30 capsule 5  . pravastatin (PRAVACHOL) 80 MG tablet Take 1 tablet (80 mg total) by mouth daily. 30 tablet 5  . pregabalin (LYRICA) 300 MG capsule Take 1 capsule (300 mg total) by mouth 2 (two) times daily. 180 capsule 1  . ranitidine (ZANTAC) 150 MG tablet Take 150 mg by mouth at bedtime.    . sertraline (ZOLOFT) 100 MG tablet Take 1 tablet (100 mg total) by mouth daily. 30 tablet 2  . VITAMIN D, CHOLECALCIFEROL, PO Take 20,000 Units by mouth daily.    Marland Kitchen zolpidem (AMBIEN) 10 MG tablet Take 1 tablet (10 mg total) by mouth at bedtime as needed for sleep. 30 tablet 0   No current facility-administered medications for this visit.    Previous Psychotropic Medications: Trazodone Step Father- Alcoholic Pt denied use of Alcohol   Substance Abuse History in the last 12 months:  No.  Consequences of Substance Abuse: Negative NA  Medical Decision Making:  Review of Psycho-Social Stressors (1)  Treatment Plan Summary: Medication management   Depression Continued Zoloft 100 mg by mouth daily  Insomnia Started her on amitriptyline 25 mg by mouth daily at bedtime  Mood symptoms started her on lamotrigine 50 mg by mouth daily  Follow-up appointment in 2 month or earlier depending on her symptoms   More than 50% of the time spent in psychoeducation, counseling and coordination of care.    This note was generated in part or whole with voice recognition software. Voice regonition is usually quite accurate but there are transcription errors  that can and very often do occur. I apologize for any typographical errors that were not detected and corrected.    Rainey Pines, MD  11/21/20162:06 PM

## 2015-08-06 ENCOUNTER — Other Ambulatory Visit: Payer: Self-pay | Admitting: Family Medicine

## 2015-08-09 ENCOUNTER — Other Ambulatory Visit: Payer: Self-pay | Admitting: Family Medicine

## 2015-08-10 ENCOUNTER — Other Ambulatory Visit: Payer: Self-pay | Admitting: Family Medicine

## 2015-08-19 ENCOUNTER — Ambulatory Visit: Payer: Medicare Other

## 2015-08-19 ENCOUNTER — Encounter: Payer: Self-pay | Admitting: Family Medicine

## 2015-08-30 ENCOUNTER — Ambulatory Visit: Payer: Self-pay | Admitting: Dietician

## 2015-08-31 ENCOUNTER — Other Ambulatory Visit: Payer: Self-pay | Admitting: Family Medicine

## 2015-08-31 ENCOUNTER — Ambulatory Visit: Payer: Medicare Other | Admitting: Family Medicine

## 2015-08-31 NOTE — Telephone Encounter (Signed)
Patient requesting refill. 

## 2015-09-07 ENCOUNTER — Ambulatory Visit (INDEPENDENT_AMBULATORY_CARE_PROVIDER_SITE_OTHER): Payer: Medicare Other | Admitting: Family Medicine

## 2015-09-07 ENCOUNTER — Encounter: Payer: Self-pay | Admitting: Family Medicine

## 2015-09-07 VITALS — BP 108/66 | HR 94 | Temp 98.4°F | Resp 16 | Ht 62.0 in | Wt 217.6 lb

## 2015-09-07 DIAGNOSIS — F339 Major depressive disorder, recurrent, unspecified: Secondary | ICD-10-CM

## 2015-09-07 DIAGNOSIS — G4733 Obstructive sleep apnea (adult) (pediatric): Secondary | ICD-10-CM | POA: Diagnosis not present

## 2015-09-07 DIAGNOSIS — E1142 Type 2 diabetes mellitus with diabetic polyneuropathy: Secondary | ICD-10-CM | POA: Diagnosis not present

## 2015-09-07 DIAGNOSIS — G2401 Drug induced subacute dyskinesia: Secondary | ICD-10-CM | POA: Diagnosis not present

## 2015-09-07 DIAGNOSIS — E669 Obesity, unspecified: Secondary | ICD-10-CM

## 2015-09-07 DIAGNOSIS — I10 Essential (primary) hypertension: Secondary | ICD-10-CM

## 2015-09-07 DIAGNOSIS — E785 Hyperlipidemia, unspecified: Secondary | ICD-10-CM | POA: Diagnosis not present

## 2015-09-07 MED ORDER — PREGABALIN 300 MG PO CAPS: 600.0000 mg | ORAL_CAPSULE | Freq: Every day | ORAL | Status: DC

## 2015-09-07 MED ORDER — INSULIN REGULAR HUMAN 100 UNIT/ML IJ SOLN: 10.0000 [IU] | Freq: Three times a day (TID) | INTRAMUSCULAR | Status: DC

## 2015-09-07 MED ORDER — METFORMIN HCL ER 500 MG PO TB24: 500.0000 mg | ORAL_TABLET | Freq: Every day | ORAL | Status: DC

## 2015-09-07 NOTE — Progress Notes (Signed)
Name: Summer [name]   MRN: [id number]    DOB: 03-Oct-1950   Date:09/07/2015       Progress Note  Subjective  Chief Complaint  Chief Complaint  Patient presents with  . Medication Refill    3 month F/U  . Diabetes    endocrine took patient off of invokana  . Gastroesophageal Reflux    improving since up dosage  . Peripheral Neuropathy  . Depression    HPI  Major Depressive Episode: she works 13 hours daily, gets home and only eats and sleep, she states it is getting worse, she is always feeling mad and frustrated at herself - because of her health problems. She states no motivation when off work, wants to sleep all the time. Also getting snappy and angry. She has sees a therapist in the past but discussed importance of seeing a psychiatrist for medication management and she agrees. She has seen Psychiatrist Dr. Clarice Pole, she is taking Zoloft, Amitriptline and  Lamictal but does not seem to be helping, advised to continue follow up with psychiatrist  SOB: Seen by cardiologist and pulmonologist last year ( 08/2014) studies normal, likely multifactorial. No chest pain associated with it.   Headaches: she has been taking Excedrin for headaches , but states episodes not as often now, instead of daily, it is down to 4-5 times weekly, explained the risk of rebound headaches  HTN: currently on half dose of Losartan, bp still a little low, discussed decreasing dose of metoprolol and monitor HR, if remains below 100 may try to stop it  OSA: not using CPAP machine, she states she can't tolerate it. Explained that it may be the cause of fatigue, try to keep it on for at least 4 hours per night, and it may also help decrease headache episodes  Hypothyroidism: taking Levothyroxine as prescribed, weight has been stable, she is always tired, no hair loss.   Tardive Dyskensia: it has improved since Lyrica was changed from one pill twice daily to two qhs, mouth movements not as frequent or intense.   DM  with neuropathy: currently seeing Endocrinologist at [facility] ) and last hbgA1C was 7.6. Glucose seems to be under better control and will have foot surgery soon. She is taking Lyrica for neuropathy and even though she has to pay cash, she does not want to stop medication because it helps with her symptoms.   Patient Active Problem List   Diagnosis Date Noted  . Hypertriglyceridemia 07/27/2015  . Acquired abduction deformity of foot 07/12/2015  . Osteoarthritis of subtalar joint 07/12/2015  . Polyneuropathy in diseases classified elsewhere ([place]) 07/12/2015  . Arthritis of foot, degenerative 07/12/2015  . Carpal tunnel syndrome 04/17/2015  . Chronic constipation 04/17/2015  . Insomnia, persistent 04/17/2015  . Chronic kidney disease (CKD), stage III (moderate) 04/17/2015  . Decreased exercise tolerance 04/17/2015  . Diabetes mellitus with polyneuropathy ([place]) 04/17/2015  . Gastro-esophageal reflux disease without esophagitis 04/17/2015  . Bursitis, trochanteric 04/17/2015  . Cephalalgia 04/17/2015  . Benign hypertension 04/17/2015  . Adult hypothyroidism 04/17/2015  . Hearing loss 04/17/2015  . Chronic recurrent major depressive disorder ([place]) 04/17/2015  . Neurogenic claudication 04/17/2015  . Obesity (BMI 35.0-39.9 without comorbidity) ([place]) 04/17/2015  . Hypo-ovarianism 04/17/2015  . Perennial allergic rhinitis with seasonal variation 04/17/2015  . Acne erythematosa 04/17/2015  . Dyskinesia, tardive 04/17/2015  . Memory loss 04/17/2015  . Impingement syndrome of shoulder 04/17/2015  . Dermatitis, stasis 04/17/2015  . Obstructive sleep apnea 05/14/2014  .  Dyspnea 05/06/2014  . Hyperlipidemia 02/06/2012  . LBP (low back pain) 09/16/2008    Past Surgical History  Procedure Laterality Date  . Tubal ligation    . Vaginal hysterectomy  1989  . Neck surgery    . Cataract extraction  01/2011    right  . Eye lid surgery  2013    bilateral    Family History   Problem Relation Age of Onset  . Heart attack Mother   . Aneurysm Mother     Social History   Social History  . Marital Status: Widowed    Spouse Name: N/A  . Number of Children: N/A  . Years of Education: N/A   Occupational History  . Not on file.   Social History Main Topics  . Smoking status: Never Smoker   . Smokeless tobacco: Never Used  . Alcohol Use: 0.0 - 0.6 oz/week    0 Standard drinks or equivalent, 0-1 Glasses of wine per week  . Drug Use: No  . Sexual Activity:    Partners: Male   Other Topics Concern  . Not on file   Social History Narrative     Current outpatient prescriptions:  .  amitriptyline (ELAVIL) 25 MG tablet, Take 1 tablet (25 mg total) by mouth at bedtime., Disp: 30 tablet, Rfl: 2 .  Cyanocobalamin 2500 MCG CHEW, Chew by mouth daily., Disp: , Rfl:  .  diclofenac sodium (VOLTAREN) 1 % GEL, Apply topically as needed., Disp: , Rfl:  .  Exenatide ER (BYDUREON) 2 MG PEN, Inject 1 pen into the skin once a week., Disp: , Rfl:  .  Glucose Blood (BLOOD GLUCOSE TEST STRIPS) STRP, , Disp: , Rfl:  .  insulin glargine (LANTUS) 100 UNIT/ML injection, Inject 60 Units into the skin at bedtime., Disp: , Rfl:  .  Insulin Pen Needle (RELION PEN NEEDLE 31G/8MM) 31G X 8 MM MISC, by Does not apply route 2 (two) times daily., Disp: , Rfl:  .  insulin regular (NOVOLIN R,HUMULIN R) 100 units/mL injection, Inject 0.1 mLs (10 Units total) into the skin 3 (three) times daily before meals., Disp: 10 mL, Rfl: 0 .  lamoTRIgine (LAMICTAL) 25 MG tablet, Take 2 tablets (50 mg total) by mouth daily., Disp: 60 tablet, Rfl: 2 .  levothyroxine (SYNTHROID, LEVOTHROID) 25 MCG tablet, TAKE ONE TABLET BY MOUTH ONCE DAILY, Disp: 90 tablet, Rfl: 0 .  losartan-hydrochlorothiazide (HYZAAR) 100-12.5 MG per tablet, Take 0.5 tablets by mouth daily., Disp: 30 tablet, Rfl: 5 .  metFORMIN (GLUCOPHAGE-XR) 500 MG 24 hr tablet, Take 1 tablet (500 mg total) by mouth daily with breakfast., Disp: 60  tablet, Rfl: 0 .  omega-3 acid ethyl esters (LOVAZA) 1 G capsule, Take 1 capsule (1 g total) by mouth 2 (two) times daily., Disp: 60 capsule, Rfl: 5 .  omeprazole (PRILOSEC) 40 MG capsule, Take 1 capsule (40 mg total) by mouth daily., Disp: 30 capsule, Rfl: 5 .  pravastatin (PRAVACHOL) 80 MG tablet, Take 1 tablet (80 mg total) by mouth daily., Disp: 30 tablet, Rfl: 5 .  pregabalin (LYRICA) 300 MG capsule, Take 2 capsules (600 mg total) by mouth daily., Disp: 180 capsule, Rfl: 1 .  ranitidine (ZANTAC) 150 MG tablet, Take 150 mg by mouth at bedtime., Disp: , Rfl:  .  sertraline (ZOLOFT) 100 MG tablet, Take 1 tablet (100 mg total) by mouth daily., Disp: 90 tablet, Rfl: 2 .  VITAMIN D, CHOLECALCIFEROL, PO, Take 20,000 Units by mouth daily., Disp: , Rfl:  .  zolpidem (AMBIEN) 10 MG tablet, TAKE ONE TABLET BY MOUTH AT BEDTIME, Disp: 30 tablet, Rfl: 2  Allergies  Allergen Reactions  . Atorvastatin     muscle pain  . Codeine   . Latex Rash     ROS  Constitutional: Negative for fever or weight change.  Respiratory: Negative for cough , she has chronic  shortness of breath.   Cardiovascular: Negative for chest pain or palpitations.  Gastrointestinal: Negative for abdominal pain, no bowel changes.  Musculoskeletal: Positive  for gait problem ( secondary to left foot pain )  No  joint swelling.  Skin: Negative for rash.  Neurological: Negative for dizziness , positive for intermittent headache.  No other specific complaints in a complete review of systems (except as listed in HPI above).  Objective  Filed Vitals:   09/07/15 1457  BP: 108/66  Pulse: 94  Temp: 98.4 F (36.9 C)  TempSrc: Oral  Resp: 16  Height: 5\' 2"  (1.575 m)  Weight: 217 lb 9.6 oz (98.703 kg)  SpO2: 97%    Body mass index is 39.79 kg/(m^2).  Physical Exam  Constitutional: Patient appears well-developed and well-nourished. Obese  No distress.  HEENT: head atraumatic, normocephalic, pupils equal and reactive to  light,  neck supple, throat within normal limits Cardiovascular: Normal rate, regular rhythm and normal heart sounds.  No murmur heard. No BLE edema. Pulmonary/Chest: Effort normal and breath sounds normal. No respiratory distress. Abdominal: Soft.  There is no tenderness. Psychiatric: Patient has a normal mood and affect. behavior is normal. Judgment and thought content normal. Neuro: some lip smacking noticed during exam  Recent Results (from the past 2160 hour(s))  POCT HgB A1C     Status: None   Collection Time: 07/20/15 10:53 AM  Result Value Ref Range   Hemoglobin A1C 7.6   POCT UA - Microalbumin     Status: None   Collection Time: 07/22/15  2:05 PM  Result Value Ref Range   Microalbumin Ur, POC 20 mg/L   Creatinine, POC  mg/dL   Albumin/Creatinine Ratio, Urine, POC    Lipid panel     Status: Abnormal   Collection Time: 07/23/15  8:09 AM  Result Value Ref Range   Cholesterol, Total 213 (H) 100 - 199 mg/dL   Triglycerides 593 (HH) 0 - 149 mg/dL   HDL 30 (L) >39 mg/dL    Comment: According to ATP-III Guidelines, HDL-C >59 mg/dL is considered a negative risk factor for CHD.    VLDL Cholesterol Cal Comment 5 - 40 mg/dL    Comment: The calculation for the VLDL cholesterol is not valid when triglyceride level is >400 mg/dL.    LDL Calculated Comment 0 - 99 mg/dL    Comment: Triglyceride result indicated is too high for an accurate LDL cholesterol estimation.    Chol/HDL Ratio 7.1 (H) 0.0 - 4.4 ratio units    Comment:                                   T. Chol/HDL Ratio                                             Men  Women  1/2 Avg.Risk  3.4    3.3                                   Avg.Risk  5.0    4.4                                2X Avg.Risk  9.6    7.1                                3X Avg.Risk 23.4   11.0   Comprehensive metabolic panel     Status: Abnormal   Collection Time: 07/23/15  8:09 AM  Result Value Ref Range   Glucose 176 (H) 65  - 99 mg/dL   BUN 31 (H) 8 - 27 mg/dL   Creatinine, Ser 1.17 (H) 0.57 - 1.00 mg/dL   GFR calc non Af Amer 49 (L) >59 mL/min/1.73   GFR calc Af Amer 56 (L) >59 mL/min/1.73   BUN/Creatinine Ratio 26 11 - 26   Sodium 137 136 - 144 mmol/L   Potassium 4.3 3.5 - 5.2 mmol/L   Chloride 94 (L) 97 - 106 mmol/L   CO2 23 18 - 29 mmol/L   Calcium 10.1 8.7 - 10.3 mg/dL   Total Protein 6.3 6.0 - 8.5 g/dL   Albumin 4.3 3.6 - 4.8 g/dL   Globulin, Total 2.0 1.5 - 4.5 g/dL   Albumin/Globulin Ratio 2.2 1.1 - 2.5   Bilirubin Total 0.3 0.0 - 1.2 mg/dL   Alkaline Phosphatase 88 39 - 117 IU/L   AST 15 0 - 40 IU/L   ALT 16 0 - 32 IU/L      PHQ2/9: Depression screen [facility] 2/9 07/20/2015 04/20/2015  Decreased Interest 0 3  Down, Depressed, Hopeless 1 3  PHQ - 2 Score 1 6  Altered sleeping - 3  Tired, decreased energy - 3  Change in appetite - 3  Feeling bad or failure about yourself  - 3  Trouble concentrating - 3  Moving slowly or fidgety/restless - 3  Suicidal thoughts - 3  PHQ-9 Score - 27  Difficult doing work/chores - Very difficult    Fall Risk: Fall Risk  07/20/2015 04/20/2015  Falls in the past year? Yes No  Number falls in past yr: 1 -  Injury with Fall? No -     Assessment & Plan  1. Chronic recurrent major depressive disorder ([place])  Continue follow up with psychiatrist  2. Obesity (BMI 35.0-39.9 without comorbidity) ([place])  Discussed with the patient the risk posed by an increased BMI. Discussed importance of portion control, calorie counting and at least 150 minutes of physical activity weekly. Avoid sweet beverages and drink more water. Eat at least 6 servings of fruit and vegetables daily   3. Hyperlipidemia  Continue medication   4. Dyskinesia, tardive  Slightly improve  5. Diabetic polyneuropathy associated with type 2 diabetes mellitus ([place])  Continue follow up with endo, doing better - pregabalin (LYRICA) 300 MG capsule; Take 2 capsules (600 mg total) by mouth daily.   Dispense: 180 capsule; Refill: 1 - insulin regular (NOVOLIN R,HUMULIN R) 100 units/mL injection; Inject 0.1 mLs (10 Units total) into the skin 3 (three) times daily before meals.  Dispense: 10 mL; Refill: 0 - metFORMIN (GLUCOPHAGE-XR) 500 MG 24  hr tablet; Take 1 tablet (500 mg total) by mouth daily with breakfast.  Dispense: 60 tablet; Refill: 0  6. Obstructive sleep apnea  She needs to resume CPAP machine  7. Benign hypertension  Stop metoprolol, monitor HR and bp and call back if goes up

## 2015-09-14 DIAGNOSIS — E1165 Type 2 diabetes mellitus with hyperglycemia: Secondary | ICD-10-CM | POA: Diagnosis not present

## 2015-09-14 DIAGNOSIS — Z79899 Other long term (current) drug therapy: Secondary | ICD-10-CM | POA: Diagnosis not present

## 2015-09-14 DIAGNOSIS — Z794 Long term (current) use of insulin: Secondary | ICD-10-CM | POA: Diagnosis not present

## 2015-09-14 DIAGNOSIS — E162 Hypoglycemia, unspecified: Secondary | ICD-10-CM | POA: Diagnosis not present

## 2015-09-14 DIAGNOSIS — E118 Type 2 diabetes mellitus with unspecified complications: Secondary | ICD-10-CM | POA: Diagnosis not present

## 2015-09-20 ENCOUNTER — Encounter: Payer: Self-pay | Admitting: Family Medicine

## 2015-09-21 ENCOUNTER — Ambulatory Visit: Payer: Self-pay | Admitting: Dietician

## 2015-09-21 ENCOUNTER — Telehealth: Payer: Self-pay | Admitting: Family Medicine

## 2015-09-21 NOTE — Telephone Encounter (Signed)
Patient was referred to [facility] and they are trying to refer her to Dr Earney Navy [facility] in [place]). Due to her insurance with our name on her card, they are not able to send the referral, we have to do it. Please call the patient once this is complete because her surgery is scheduled for next week. Insurance company #: [phone] Member ID: [id number]

## 2015-09-22 NOTE — Telephone Encounter (Signed)
Spoke with Summer [name] and notified her that I will call Vaughan Basta at [facility] and find out what we need to do. Spoke with Vaughan Basta, she informed me of Dr. Earney Navy NPI and the ICD-10#'s needed for the [facility] referral. I tried to place the referral in the [facility] referral and didn't have any luck with the NPI numbers that were given to me. I tried an extensive search by both name and NPI#, in-network and out of network. I called Vaughan Basta back and notified her that and verified exactly what the NPI is for Dr. Earney Navy and that is what I had written down. Vaughan Basta advised me to try [facility]'s NPI at [id number], I advised her that more than likely that would not go through and as I expected, it has not, the referral it still in "suspended" status. Vaughan Basta stated "She would call Ms. Bauguess and notify her of the status of the referral.

## 2015-09-25 ENCOUNTER — Other Ambulatory Visit: Payer: Self-pay | Admitting: Family Medicine

## 2015-09-28 DIAGNOSIS — Z794 Long term (current) use of insulin: Secondary | ICD-10-CM | POA: Diagnosis not present

## 2015-09-28 DIAGNOSIS — E118 Type 2 diabetes mellitus with unspecified complications: Secondary | ICD-10-CM | POA: Diagnosis not present

## 2015-09-28 DIAGNOSIS — E1165 Type 2 diabetes mellitus with hyperglycemia: Secondary | ICD-10-CM | POA: Diagnosis not present

## 2015-09-29 DIAGNOSIS — G4733 Obstructive sleep apnea (adult) (pediatric): Secondary | ICD-10-CM | POA: Diagnosis not present

## 2015-09-29 DIAGNOSIS — I152 Hypertension secondary to endocrine disorders: Secondary | ICD-10-CM | POA: Diagnosis not present

## 2015-09-29 DIAGNOSIS — G4700 Insomnia, unspecified: Secondary | ICD-10-CM | POA: Diagnosis not present

## 2015-09-30 ENCOUNTER — Telehealth: Payer: Self-pay | Admitting: *Deleted

## 2015-09-30 ENCOUNTER — Ambulatory Visit (INDEPENDENT_AMBULATORY_CARE_PROVIDER_SITE_OTHER): Payer: Commercial Managed Care - HMO | Admitting: Podiatry

## 2015-09-30 ENCOUNTER — Encounter: Payer: Self-pay | Admitting: Podiatry

## 2015-09-30 VITALS — BP 133/72 | HR 104 | Resp 18

## 2015-09-30 DIAGNOSIS — M19072 Primary osteoarthritis, left ankle and foot: Secondary | ICD-10-CM

## 2015-09-30 DIAGNOSIS — E11610 Type 2 diabetes mellitus with diabetic neuropathic arthropathy: Secondary | ICD-10-CM

## 2015-09-30 DIAGNOSIS — M19079 Primary osteoarthritis, unspecified ankle and foot: Secondary | ICD-10-CM | POA: Diagnosis not present

## 2015-09-30 NOTE — Telephone Encounter (Signed)
-----   Message from Trula Slade, DPM sent at 09/30/2015  8:45 AM EST ----- Can you order an MRI of the left foot to look for osteoarthirits and surgical planning

## 2015-09-30 NOTE — Progress Notes (Signed)
Patient ID: Summer [name], female   DOB: [birth year], 65 y.o.   MRN: [id number]  Subjective: 65 year old female present for the office they and she would like to discuss surgery for her left foot. She was scheduled to have surgery at [facility] however due to insurance issues she will discuss surgical intervention with me. She states that she continues have pain to her left foot has started to affect her knee and her hip and her quality of life. She is attended shoe gear modifications, offloading, padding, bracing without any relief and should proceed with surgical intervention this time. No other complaints at this time.   Objective: AAO 3, NAD DP/PT pulses palpable, CRT less than 3 seconds Protective sensation decreased with Simms Weinstein monofilament There is a significant decrease in medial arch height upon weightbearing there is prominence of the medial talus on the plantar medial aspect of the foot. There is forefoot abduction present. There is no range of motion of the subtalar joint. Ankle joint range of motion is intact. Midtarsal joint is limited in motion. Hammertoe contractures are present at digits 1-5.  There is pre-ulcerative lesion on the plantar medial aspect of the foot from talar subluxation however there is no open sore this time her significant hyperkeratotic tissue. This is around the site of the previous ulcer. There is no other areas of skin breakdown. There is no pain with calf compression, swelling, warmth, erythema to bilateral lower extremities. There is no overlying edema, erythema, increase in warmth bilateral lower extremities.  Assessment: 65 year old female with left foot deformity causing a changing gait and ulcerations  Plan: -X-rays were obtained and reviewed with the patient.  -Treatment options discussed including all alternatives, risks, and complications I discussed with her surgical intervention including triple arthrodesis. Prior surgery will obtain an MRI to  further evaluate the joint integrity. This is ordered today. -Follow-up after MRI or sooner if any problems arise. In the meantime, encouraged to call the office with any questions, concerns, change in symptoms.   [name], DPM

## 2015-09-30 NOTE — Telephone Encounter (Addendum)
Orders faxed to [facility].  Prior authorization started throught [name], required [employer] form, MRI order, clinicals and pt demographics to [phone].  10/04/2015 - FAXED PRIOR AUTHORIZATION from [facility] [id number], [place] 10/01/2015 TO 03/30/2016 to [facility].  10/28/2015-PT REQUEST MRI RESULTS. Called pt she states she is scheduled to discuss results with Dr. Jacqualyn Posey.  11/01/2015-DR. WAGONER REQUEST PT come in to discuss therapy, and she is scheduled for 11/11/2015.

## 2015-10-21 ENCOUNTER — Other Ambulatory Visit: Payer: Self-pay

## 2015-10-21 DIAGNOSIS — F339 Major depressive disorder, recurrent, unspecified: Secondary | ICD-10-CM

## 2015-10-21 DIAGNOSIS — I10 Essential (primary) hypertension: Secondary | ICD-10-CM

## 2015-10-21 DIAGNOSIS — K219 Gastro-esophageal reflux disease without esophagitis: Secondary | ICD-10-CM

## 2015-10-21 DIAGNOSIS — E1142 Type 2 diabetes mellitus with diabetic polyneuropathy: Secondary | ICD-10-CM

## 2015-10-21 DIAGNOSIS — E785 Hyperlipidemia, unspecified: Secondary | ICD-10-CM

## 2015-10-21 NOTE — Telephone Encounter (Signed)
Got a fax from [facility] requesting a refill of the above listed medications.  Refill request was sent to Dr. Steele Sizer for approval and submission.

## 2015-10-21 NOTE — Telephone Encounter (Signed)
She has follow up on the 13th, does she really need all of them now?

## 2015-10-22 NOTE — Telephone Encounter (Signed)
She said she is about out of all of them, so she needs them now.

## 2015-10-24 ENCOUNTER — Other Ambulatory Visit: Payer: Self-pay | Admitting: Family Medicine

## 2015-10-25 ENCOUNTER — Ambulatory Visit
Admission: RE | Admit: 2015-10-25 | Discharge: 2015-10-25 | Disposition: A | Discharge status: Home / Self Care | Payer: Commercial Managed Care - HMO | Source: Ambulatory Visit | Attending: Podiatry | Admitting: Podiatry

## 2015-10-25 DIAGNOSIS — R600 Localized edema: Secondary | ICD-10-CM | POA: Diagnosis not present

## 2015-10-25 DIAGNOSIS — M67874 Other specified disorders of tendon, left ankle and foot: Secondary | ICD-10-CM | POA: Insufficient documentation

## 2015-10-25 DIAGNOSIS — M24272 Disorder of ligament, left ankle: Secondary | ICD-10-CM | POA: Insufficient documentation

## 2015-10-25 DIAGNOSIS — M19072 Primary osteoarthritis, left ankle and foot: Secondary | ICD-10-CM | POA: Diagnosis not present

## 2015-10-25 DIAGNOSIS — E11610 Type 2 diabetes mellitus with diabetic neuropathic arthropathy: Secondary | ICD-10-CM | POA: Insufficient documentation

## 2015-10-26 ENCOUNTER — Telehealth: Payer: Self-pay | Admitting: Family Medicine

## 2015-10-26 MED ORDER — PRAVASTATIN SODIUM 80 MG PO TABS: 80.0000 mg | ORAL_TABLET | Freq: Every day | ORAL | Status: DC

## 2015-10-26 MED ORDER — PREGABALIN 300 MG PO CAPS: 600.0000 mg | ORAL_CAPSULE | Freq: Every day | ORAL | Status: DC

## 2015-10-26 MED ORDER — SERTRALINE HCL 100 MG PO TABS: 100.0000 mg | ORAL_TABLET | Freq: Every day | ORAL | Status: DC

## 2015-10-26 MED ORDER — ZOLPIDEM TARTRATE 10 MG PO TABS: 10.0000 mg | ORAL_TABLET | Freq: Every day | ORAL | Status: DC

## 2015-10-26 MED ORDER — LAMOTRIGINE 25 MG PO TABS: 50.0000 mg | ORAL_TABLET | Freq: Every day | ORAL | Status: DC

## 2015-10-26 MED ORDER — OMEPRAZOLE 40 MG PO CPDR: 40.0000 mg | DELAYED_RELEASE_CAPSULE | Freq: Every day | ORAL | Status: DC

## 2015-10-26 MED ORDER — LOSARTAN POTASSIUM-HCTZ 100-12.5 MG PO TABS: 0.5000 | ORAL_TABLET | Freq: Every day | ORAL | Status: DC

## 2015-10-26 MED ORDER — METFORMIN HCL ER 500 MG PO TB24: 500.0000 mg | ORAL_TABLET | Freq: Every day | ORAL | Status: DC

## 2015-10-26 NOTE — Telephone Encounter (Signed)
Pt states she would like a call back. 

## 2015-10-26 NOTE — Telephone Encounter (Signed)
Patient was informed that all meds were either electronically submitted or faxed to [facility].

## 2015-10-29 DIAGNOSIS — G4733 Obstructive sleep apnea (adult) (pediatric): Secondary | ICD-10-CM | POA: Diagnosis not present

## 2015-10-29 DIAGNOSIS — G4700 Insomnia, unspecified: Secondary | ICD-10-CM | POA: Diagnosis not present

## 2015-10-29 DIAGNOSIS — I152 Hypertension secondary to endocrine disorders: Secondary | ICD-10-CM | POA: Diagnosis not present

## 2015-10-29 NOTE — Telephone Encounter (Signed)
Please schedule her a follow-up appointment; 30 min appointment it will be a long talk

## 2015-11-02 DIAGNOSIS — Z794 Long term (current) use of insulin: Secondary | ICD-10-CM | POA: Diagnosis not present

## 2015-11-02 DIAGNOSIS — E118 Type 2 diabetes mellitus with unspecified complications: Secondary | ICD-10-CM | POA: Diagnosis not present

## 2015-11-02 DIAGNOSIS — E1165 Type 2 diabetes mellitus with hyperglycemia: Secondary | ICD-10-CM | POA: Diagnosis not present

## 2015-11-06 DIAGNOSIS — E119 Type 2 diabetes mellitus without complications: Secondary | ICD-10-CM | POA: Diagnosis not present

## 2015-11-09 DIAGNOSIS — E781 Pure hyperglyceridemia: Secondary | ICD-10-CM | POA: Diagnosis not present

## 2015-11-09 DIAGNOSIS — E1165 Type 2 diabetes mellitus with hyperglycemia: Secondary | ICD-10-CM | POA: Diagnosis not present

## 2015-11-09 DIAGNOSIS — Z794 Long term (current) use of insulin: Secondary | ICD-10-CM | POA: Diagnosis not present

## 2015-11-09 DIAGNOSIS — Z79899 Other long term (current) drug therapy: Secondary | ICD-10-CM | POA: Diagnosis not present

## 2015-11-09 DIAGNOSIS — E6609 Other obesity due to excess calories: Secondary | ICD-10-CM | POA: Diagnosis not present

## 2015-11-11 ENCOUNTER — Ambulatory Visit (INDEPENDENT_AMBULATORY_CARE_PROVIDER_SITE_OTHER): Payer: Commercial Managed Care - HMO | Admitting: Podiatry

## 2015-11-11 ENCOUNTER — Encounter: Payer: Self-pay | Admitting: Podiatry

## 2015-11-11 VITALS — BP 143/68 | HR 99 | Resp 18

## 2015-11-11 DIAGNOSIS — M19072 Primary osteoarthritis, left ankle and foot: Secondary | ICD-10-CM

## 2015-11-11 DIAGNOSIS — E11610 Type 2 diabetes mellitus with diabetic neuropathic arthropathy: Secondary | ICD-10-CM | POA: Diagnosis not present

## 2015-11-11 DIAGNOSIS — E1140 Type 2 diabetes mellitus with diabetic neuropathy, unspecified: Secondary | ICD-10-CM

## 2015-11-11 NOTE — Patient Instructions (Signed)

## 2015-11-12 ENCOUNTER — Other Ambulatory Visit: Payer: Self-pay | Admitting: Psychiatry

## 2015-11-16 ENCOUNTER — Telehealth: Payer: Self-pay | Admitting: *Deleted

## 2015-11-16 ENCOUNTER — Other Ambulatory Visit: Payer: Self-pay | Admitting: Family Medicine

## 2015-11-16 DIAGNOSIS — I10 Essential (primary) hypertension: Secondary | ICD-10-CM

## 2015-11-16 NOTE — Telephone Encounter (Signed)
Pt states she needs refills on Montelukast, Losartan, Levothyroxine, Metropolol, Losarcan/HCT to be sent to [facility]. This is patients new insurance.

## 2015-11-16 NOTE — Telephone Encounter (Signed)
"  I need to know my surgery date.  I been calling and nobody has called me back."  I don't have a surgery date for you yet.  Dr. Jacqualyn Posey is requesting another doctor to assist him.  We have to figure out when the surgery can be done because the other doctor will have to block their schedule to assist.  "Well, I want it done as soon as possible.  I guess you'll call me back and let me know something?"  Yes, I'll give you a call back.

## 2015-11-17 NOTE — Telephone Encounter (Signed)
Refill request was sent to Dr. Krichna Sowles for approval and submission.  

## 2015-11-18 MED ORDER — LEVOTHYROXINE SODIUM 25 MCG PO TABS: 25.0000 ug | ORAL_TABLET | Freq: Every day | ORAL | Status: DC

## 2015-11-18 MED ORDER — LOSARTAN POTASSIUM-HCTZ 100-12.5 MG PO TABS: 0.5000 | ORAL_TABLET | Freq: Every day | ORAL | Status: DC

## 2015-11-18 MED ORDER — MONTELUKAST SODIUM 10 MG PO TABS: 10.0000 mg | ORAL_TABLET | Freq: Every day | ORAL | Status: DC

## 2015-11-18 MED ORDER — METOPROLOL TARTRATE 25 MG PO TABS: 25.0000 mg | ORAL_TABLET | Freq: Two times a day (BID) | ORAL | Status: DC

## 2015-11-18 NOTE — Telephone Encounter (Signed)
I'm calling to let you know we are going to rearrange Dr. Leeanne Rio schedule.  So, they can do your surgery on 12/01/2015.  "Okay, that sounds good.  I tell you that means so much to mean.  Thank you."  You're welcome.

## 2015-11-18 NOTE — Progress Notes (Signed)
Patient ID: Summer [name], female   DOB: November 13, 1950, 65 y.o.   MRN: [id number]  Subjective: 65 year old female present for the office today to discuss MRI results and for surgical consultation. She states that she continues to have significant pain to her left foot and she feels that she is dragging her foot due to the deformity. She does have a previous history of an ulceration which is healed. She states that the pain is progressing as she is not able to perform daily activities that she was able to do before. Because of this should discuss surgical intervention. She previously had seen orthopedics at [facility] as well however due to insurance issues she came back to see me. No other complaints at this time in no acute changes.  Objective: AAO 3, NAD DP/PT pulses palpable, CRT less than 3 seconds Protective sensation decreased with Simms Weinstein monofilament There is a significant decrease in medial arch height upon weightbearing there is prominence of the medial talus on the plantar medial aspect of the foot. There is forefoot abduction present. There is no range of motion of the subtalar joint. Ankle joint range of motion is intact and without pain. Midtarsal joint is limited in motion. Hammertoe contractures are present at digits 1-5.  There is pre-ulcerative lesion on the plantar medial aspect of the foot from talar subluxation however there is no open sore this time her significant hyperkeratotic tissue. This is around the site of the previous ulcer. There is no other areas of skin breakdown. There is no pain with calf compression, swelling, warmth, erythema to bilateral lower extremities. There is no overlying edema, erythema, increase in warmth bilateral lower extremities.  Assessment: 65 year old female with left foot deformity causing a changing gait and pre-ulcerative lesions.   Plan: -Treatment options discussed including all alternatives, risks, and complications -MRI results discussed -I  discussed both conservative and surgical treatment options the patient. At this point she is attended multiple conservative treatments with any relief of symptoms and requested surgical intervention this time. After long discussion the patient discussed with her triple arthrodesis. I discussed with her this will hopefully give her relief of symptoms however this is not a guarantee and the pain conduction increase afterwards. Also discussed with her that she may need to have hammertoes or other surgery however we will start with a triple arthrodesis as well as with the majority of her symptoms are localized. She would proceed with surgery. -The incision placement as well as the postoperative course was discussed with the patient. I discussed risks of the surgery which include, but not limited to, infection, bleeding, pain, swelling, need for further surgery, delayed or nonhealing, painful or ugly scar, numbness or sensation changes, over/under correction, recurrence, transfer lesions, further deformity, hardware failure, DVT/PE, loss of toe/foot/leg. Patient understands these risks and wishes to proceed with surgery. The surgical consent was reviewed with the patient all 3 pages were signed. No promises or guarantees were given to the outcome of the procedure. All questions were answered to the best of my ability. Before the surgery the patient was encouraged to call the office if there is any further questions. The surgery will be performed at the [facility] on an outpatient basis.  [name], DPM

## 2015-11-18 NOTE — Telephone Encounter (Signed)
"  I'm calling to see if you can do surgery on April 5.  "I have to wait that long!  I was hoping for something sooner."  I'll check with him and see if he can do it sooner. "What time will it be?"  Someone from the surgical center will call you a day or two prior to surgery date with the arrival time.

## 2015-11-23 DIAGNOSIS — E1140 Type 2 diabetes mellitus with diabetic neuropathy, unspecified: Secondary | ICD-10-CM | POA: Diagnosis not present

## 2015-11-24 LAB — HEMOGLOBIN A1C
Est. average glucose Bld gHb Est-mCnc: 203 mg/dL
Hgb A1c MFr Bld: 8.7 % — ABNORMAL HIGH (ref 4.8–5.6)

## 2015-11-29 ENCOUNTER — Telehealth: Payer: Self-pay | Admitting: Family Medicine

## 2015-11-29 ENCOUNTER — Telehealth: Payer: Self-pay | Admitting: *Deleted

## 2015-11-29 ENCOUNTER — Other Ambulatory Visit: Payer: Self-pay | Admitting: Family Medicine

## 2015-11-29 DIAGNOSIS — E038 Other specified hypothyroidism: Secondary | ICD-10-CM

## 2015-11-29 NOTE — Telephone Encounter (Signed)
Patient just needed another [facility] authorization done in order to continue seeing Dr. Atha Starks with [facility].  I put in the [facility] referral and see approved for another 6 visits starting 11/30/15..  The authorization number is [id number].  I made patient aware of the approval @ 4:25pm on 11/29/15.

## 2015-11-29 NOTE — Telephone Encounter (Signed)
I'm calling you in regards to your surgery scheduled for Wednesday with Dr. Jacqualyn Posey.  He said he's going to cancel it due to your elevated A1c levels.  He said he doesn't want to do the surgery because you may not heal properly and he doesn't want any further complications.  "I feel fine.  I want to get it done.  My foot has been killing me.  I promise him I'm fine."  He just reviewed your lab results.  Your level has risen a point higher than the previous months.  "I don't know what else to do.  I been doing everything to get it down.  I feel fine, I need to get this done."  Dr. Jacqualyn Posey said you will have to get medical clearance from doctor that treats you for your Diabetes and get your glucose level under control.  "I guess I will give her a call."

## 2015-11-29 NOTE — Telephone Encounter (Signed)
Patient has appointment with Dr Graceann [facility]) on 11-30-15 @ 8:45. They are needing a referral to be sent over today

## 2015-11-30 ENCOUNTER — Telehealth: Payer: Self-pay | Admitting: *Deleted

## 2015-11-30 DIAGNOSIS — Z794 Long term (current) use of insulin: Secondary | ICD-10-CM | POA: Diagnosis not present

## 2015-11-30 DIAGNOSIS — E781 Pure hyperglyceridemia: Secondary | ICD-10-CM | POA: Diagnosis not present

## 2015-11-30 DIAGNOSIS — E1165 Type 2 diabetes mellitus with hyperglycemia: Secondary | ICD-10-CM | POA: Diagnosis not present

## 2015-11-30 DIAGNOSIS — E6609 Other obesity due to excess calories: Secondary | ICD-10-CM | POA: Diagnosis not present

## 2015-11-30 DIAGNOSIS — Z79899 Other long term (current) drug therapy: Secondary | ICD-10-CM | POA: Diagnosis not present

## 2015-11-30 NOTE — Telephone Encounter (Addendum)
Pt left name, DOB and phone.  11/30/2015-Left message to call with concerns and I would help.  I spoke with pt she states her diabetic doctor said her diabetes is under control, and pt states she would like to set up surgery.  I told pt we would need a written form of medical clearance faxed to our office and I transferred pt to D. [facility] to schedule surgery.

## 2015-11-30 NOTE — Telephone Encounter (Signed)
"  I was scheduled for surgery on tomorrow.  I had to cancel because Dr. Jacqualyn Posey said I need to get clearance from my doctor that treats my Diabetes.  I spoke to her and she said it was okay for me to have the surgery."  Okay, we will have to have written medical clearance from your doctor.  "Yes, I told her and she said she was going to call you and see exactly what you needed.  How is she supposed to get it to you?"  She can fax it to [place].  "I'll let her know to call you to get the information.  Can we go ahead an schedule a date?"  His next available date will be 12/22/2015.  "Okay, I'll take that date.  Thank you so much."  I called Caren Griffins at [facility] and rescheduled surgery.

## 2015-11-30 NOTE — Telephone Encounter (Signed)
She needs to keep a daily record of her blood sugars so that I can review them and I would also like the A1c rechecked before surgery. Although the A1c does not really change a lot, I want to make sure it is not going up. If her sugar is not controlled then I will delay the surgery. If she would like a second opinion I can get her into see Dr. Doran Durand.

## 2015-12-01 ENCOUNTER — Telehealth: Payer: Self-pay | Admitting: *Deleted

## 2015-12-01 DIAGNOSIS — Z01818 Encounter for other preprocedural examination: Secondary | ICD-10-CM

## 2015-12-01 NOTE — Telephone Encounter (Signed)
I'm calling to let you know that Dr. Jacqualyn Posey wants you to keep a log of your glucose level so he can review them prior to surgery.  He also wants you to have your A1c checked again prior to surgery date.  "I already keep a log of my levels so that won't be a problem.  Are you going to order the lab?  I'll come by to pick it up and go to the lab across the street from you.  I'll do it the week before."  I'll put the order in and they will have it ready for you in the [place] office.

## 2015-12-02 ENCOUNTER — Other Ambulatory Visit: Payer: Self-pay | Admitting: *Deleted

## 2015-12-02 ENCOUNTER — Telehealth: Payer: Self-pay | Admitting: Podiatry

## 2015-12-02 MED ORDER — OXYCODONE-ACETAMINOPHEN 5-325 MG PO TABS: 1.0000 | ORAL_TABLET | Freq: Three times a day (TID) | ORAL | Status: DC | PRN

## 2015-12-02 NOTE — Telephone Encounter (Signed)
PATIENT CALLED STATING THAT SHE WAS HAVING MORE PAIN AND WOULD LIKE PAIN MEDICATION. CALLED AND SHE HAS HAD PERCOCET BEFORE WITH NO ISSUES (ALTHOUGH SHE HAS A CODEINE ALLERGY LISTED). PERCOCET ORDERED PER DR. Jacqualyn Posey. SHE WILL PICK UP IN THE MORNING.

## 2015-12-06 ENCOUNTER — Telehealth: Payer: Self-pay | Admitting: *Deleted

## 2015-12-06 NOTE — Telephone Encounter (Signed)
Requesting refill on pain meds-last fill was percocet #20 on 12/02/15

## 2015-12-06 NOTE — Telephone Encounter (Signed)
OK to refill. Give 30.

## 2015-12-07 ENCOUNTER — Encounter: Payer: Self-pay | Admitting: Podiatry

## 2015-12-10 ENCOUNTER — Telehealth: Payer: Self-pay | Admitting: *Deleted

## 2015-12-10 DIAGNOSIS — E11610 Type 2 diabetes mellitus with diabetic neuropathic arthropathy: Secondary | ICD-10-CM

## 2015-12-10 NOTE — Telephone Encounter (Signed)
Pt states she is having surgery with Dr. Jacqualyn Posey, and wanted to know how to get the Knee Scooter.  I told pt I had ordered the knee scooter, and she would be getting a call from [place] and discuss the logistics.

## 2015-12-15 DIAGNOSIS — Z01818 Encounter for other preprocedural examination: Secondary | ICD-10-CM | POA: Diagnosis not present

## 2015-12-15 LAB — HEMOGLOBIN A1C
Est. average glucose Bld gHb Est-mCnc: 180 mg/dL
Hgb A1c MFr Bld: 7.9 % — ABNORMAL HIGH (ref 4.8–5.6)

## 2015-12-20 ENCOUNTER — Other Ambulatory Visit: Payer: Self-pay | Admitting: Podiatry

## 2015-12-20 ENCOUNTER — Telehealth: Payer: Self-pay | Admitting: *Deleted

## 2015-12-20 MED ORDER — ENOXAPARIN SODIUM 40 MG/0.4ML ~~LOC~~ SOLN: 40.0000 mg | SUBCUTANEOUS | Status: DC

## 2015-12-20 NOTE — Telephone Encounter (Signed)
I'm calling to inform you that Dr. Jacqualyn Posey got your lab results, A1c, and he said it was much better.  He sent a prescription to your pharmacy for Lovenox.  It's an injection that you will have to give yourself after surgery.  He wants you to bring it with you the day of surgery and someone will show you how to use it.  It's to help prevent your blood from clotting after surgery.  "Okay, I'll pick it up and bring it.  I'll see him on Wednesday."

## 2015-12-21 ENCOUNTER — Other Ambulatory Visit: Payer: Self-pay | Admitting: Podiatry

## 2015-12-22 ENCOUNTER — Encounter: Payer: Self-pay | Admitting: Podiatry

## 2015-12-22 DIAGNOSIS — I10 Essential (primary) hypertension: Secondary | ICD-10-CM | POA: Diagnosis not present

## 2015-12-22 DIAGNOSIS — G8918 Other acute postprocedural pain: Secondary | ICD-10-CM | POA: Diagnosis not present

## 2015-12-22 DIAGNOSIS — M25572 Pain in left ankle and joints of left foot: Secondary | ICD-10-CM | POA: Diagnosis not present

## 2015-12-22 DIAGNOSIS — M879 Osteonecrosis, unspecified: Secondary | ICD-10-CM | POA: Diagnosis not present

## 2015-12-22 DIAGNOSIS — M19072 Primary osteoarthritis, left ankle and foot: Secondary | ICD-10-CM | POA: Diagnosis not present

## 2015-12-22 DIAGNOSIS — M8700 Idiopathic aseptic necrosis of unspecified bone: Secondary | ICD-10-CM | POA: Diagnosis not present

## 2015-12-22 DIAGNOSIS — M67472 Ganglion, left ankle and foot: Secondary | ICD-10-CM | POA: Diagnosis not present

## 2015-12-22 DIAGNOSIS — M6740 Ganglion, unspecified site: Secondary | ICD-10-CM | POA: Diagnosis not present

## 2015-12-27 ENCOUNTER — Telehealth: Payer: Self-pay | Admitting: *Deleted

## 2015-12-27 NOTE — Telephone Encounter (Signed)
Patient called-surgery on Wed with Dr. Jacqualyn Posey. She has some questions. Please call.

## 2015-12-28 ENCOUNTER — Other Ambulatory Visit: Payer: Self-pay | Admitting: Family Medicine

## 2015-12-28 NOTE — Telephone Encounter (Signed)
Entered in error

## 2015-12-28 NOTE — Telephone Encounter (Addendum)
-----   Message from Lolita Rieger sent at 12/28/2015  8:52 AM EDT -----   ----- Message -----    From: Andres Ege, RN    Sent: 12/27/2015   2:09 PM      To: Phil Dopp, pt has some questions about her surgery on Wed.  Marcy Siren  12/28/2015-Pt left questions concerning her surgery with Dr. Jacqualyn Posey on Wednesday, and I forwarded the note to D. Meadows for pre-op question and answer.  [place] informed me that pt is not scheduled for surgery 12/29/2015.  I called pt's home and left message apologizing for the delay in calling and why, and encouraged pt to call me on my extension and I would be happy to help.  Unable to contact pt by work phone it is a non-working number.

## 2015-12-28 NOTE — Telephone Encounter (Signed)
Patient requesting refill. 

## 2015-12-29 NOTE — Progress Notes (Signed)
DOS 12/22/2015 Left foot reconstruction including triple arthrodesis (fusion of multiple joints in your foot to help realign your foot, includes talonavicular, calcanealcuboid and subtalar joint) , and gastric recession.

## 2015-12-30 ENCOUNTER — Ambulatory Visit: Payer: Commercial Managed Care - HMO

## 2015-12-30 ENCOUNTER — Encounter: Payer: Self-pay | Admitting: Podiatry

## 2015-12-30 ENCOUNTER — Ambulatory Visit (INDEPENDENT_AMBULATORY_CARE_PROVIDER_SITE_OTHER): Payer: Commercial Managed Care - HMO | Admitting: Podiatry

## 2015-12-30 DIAGNOSIS — E11610 Type 2 diabetes mellitus with diabetic neuropathic arthropathy: Secondary | ICD-10-CM

## 2015-12-30 DIAGNOSIS — Z09 Encounter for follow-up examination after completed treatment for conditions other than malignant neoplasm: Secondary | ICD-10-CM

## 2015-12-30 NOTE — Progress Notes (Signed)
Patient ID: Summer [name], female   DOB: June 23, 1951, 65 y.o.   MRN: [id number]  Subjective: Summer [name] is a 65 y.o. is seen today in office s/p left bone biopsy. She states that she is doing well and not having any pain. She has been wearing a surgical shoe. She was able to wear cam boot due to her foot deformity. She is continuing antibiotics. Denies any systemic complaints such as fevers, chills, nausea, vomiting. No calf pain, chest pain, shortness of breath.   Objective: General: No acute distress, AAOx3  DP/PT pulses palpable 2/4, CRT < 3 sec to all digits.  Protective sensation intact. Motor function intact.  Left foot: Incision is well coapted without any evidence of dehiscence and staples intact. There is no surrounding erythema, ascending cellulitis, fluctuance, crepitus, malodor, drainage/purulence. There is moderate edema around the surgical site. There is no pain along the surgical site.  No other areas of tenderness to bilateral lower extremities.  No other open lesions or pre-ulcerative lesions.  No pain with calf compression, swelling, warmth, erythema.   Assessment and Plan:  Status post left foot bone biopsy  -Treatment options discussed including all alternatives, risks, and complications -X-rays were obtained and reviewed with the patient.  -Pathology results were discussed with the patient. -Antibiotic ointment was applied over the incisions followed by dry sterile dressing. Keep dressing clean, dry, intact. -Ice/elevation -Pain medication as needed. -Will contact the pathologist in regards to clarification of the pathology results. Also at this point if she elects to proceed with further surgery with the next fixators weren't to given the bone quality. Also discussed with her other physicians in regards to this for possible referral. -Monitor for any clinical signs or symptoms of infection and DVT/PE and directed to call the office immediately should any occur or go to the  ER. -Follow-up in 1 week for POSSIBLE staple removal or sooner if any problems arise. In the meantime, encouraged to call the office with any questions, concerns, change in symptoms.   [name], DPM

## 2016-01-03 ENCOUNTER — Telehealth: Payer: Self-pay | Admitting: *Deleted

## 2016-01-03 NOTE — Telephone Encounter (Addendum)
-----   Message from Trula Slade, DPM sent at 12/30/2015  8:25 PM EDT ----- Is there anyway we can call the pathology department from where her specimen was sent and ask for a clarification on what the results of the bone show? It seemed generic for me and I didn't know really what "reactive appearing bone" meant. 01/03/2016-I called [facility] for clarification of surgical pathology report and the receptionist states Dr. Jacqualyn Posey may like to discuss with Dr. Lyndon Code.  I will have Dr. Jacqualyn Posey call Dr. Lyndon Code.

## 2016-01-04 ENCOUNTER — Ambulatory Visit (INDEPENDENT_AMBULATORY_CARE_PROVIDER_SITE_OTHER): Payer: Commercial Managed Care - HMO | Admitting: Family Medicine

## 2016-01-04 ENCOUNTER — Encounter: Payer: Self-pay | Admitting: Family Medicine

## 2016-01-04 VITALS — BP 144/72 | HR 95 | Temp 98.9°F | Resp 16 | Wt 221.3 lb

## 2016-01-04 DIAGNOSIS — E785 Hyperlipidemia, unspecified: Secondary | ICD-10-CM

## 2016-01-04 DIAGNOSIS — G44229 Chronic tension-type headache, not intractable: Secondary | ICD-10-CM

## 2016-01-04 DIAGNOSIS — I10 Essential (primary) hypertension: Secondary | ICD-10-CM | POA: Diagnosis not present

## 2016-01-04 DIAGNOSIS — G4700 Insomnia, unspecified: Secondary | ICD-10-CM

## 2016-01-04 DIAGNOSIS — F339 Major depressive disorder, recurrent, unspecified: Secondary | ICD-10-CM | POA: Diagnosis not present

## 2016-01-04 DIAGNOSIS — G2401 Drug induced subacute dyskinesia: Secondary | ICD-10-CM | POA: Diagnosis not present

## 2016-01-04 DIAGNOSIS — E038 Other specified hypothyroidism: Secondary | ICD-10-CM

## 2016-01-04 DIAGNOSIS — E1142 Type 2 diabetes mellitus with diabetic polyneuropathy: Secondary | ICD-10-CM

## 2016-01-04 MED ORDER — ASPIRIN EC 81 MG PO TBEC: 81.0000 mg | DELAYED_RELEASE_TABLET | Freq: Every day | ORAL | Status: DC

## 2016-01-04 MED ORDER — ZOLPIDEM TARTRATE 10 MG PO TABS: 10.0000 mg | ORAL_TABLET | Freq: Every day | ORAL | Status: DC

## 2016-01-04 MED ORDER — METOPROLOL TARTRATE 25 MG PO TABS: 25.0000 mg | ORAL_TABLET | Freq: Two times a day (BID) | ORAL | Status: DC

## 2016-01-04 MED ORDER — AMITRIPTYLINE HCL 25 MG PO TABS: 25.0000 mg | ORAL_TABLET | Freq: Every day | ORAL | Status: DC

## 2016-01-04 MED ORDER — LOSARTAN POTASSIUM-HCTZ 100-12.5 MG PO TABS: 1.0000 | ORAL_TABLET | Freq: Every day | ORAL | Status: DC

## 2016-01-04 MED ORDER — INSULIN GLARGINE 100 UNIT/ML SOLOSTAR PEN: 50.0000 [IU] | PEN_INJECTOR | Freq: Every day | SUBCUTANEOUS | Status: DC

## 2016-01-04 NOTE — Progress Notes (Signed)
Name: Summer Summer [name]   MRN: [id number]    DOB: 11/13/1950   Date:01/04/2016       Progress Note  Subjective  Chief Complaint  Chief Complaint  Patient presents with  . Annual Exam  . Depression    patient is here for her 4-month f/u  . Obesity  . Hyperlipidemia  . obstructive sleep apnea  . Hypertension    patient stated that she has splitting headaches. 205/103 on yesterday.  . Diabetes    patient checks it TID. highest 121 lowest 89  . 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 She states no motivation when off work, wants to sleep all the time. Also getting snappy and angry. She has sees a therapist in the past but discussed importance of seeing therapist again. . She has seen Psychiatrist Dr. Fahem, she is taking Zoloft, Amitriptline and Lamictal but does not seem to be helping, advised to continue follow up with psychiatrist, but she states she did not feel like it was helping her. She states not enough talking to the psychiatrist. Elavil helped her sleep better.   SOB: Seen by cardiologist and pulmonologist last year ( 08/2014) studies normal, likely multifactorial. No chest pain associated with it.   Headaches: she has been taking Excedrin for headaches daily, and having daily headaches, off Elavil, not sure if it was helping , drinking mountain due - diet - twice daily   HTN: currently on half dose of Losartan, bp is up again, we will increase it back to previous dose. BP has been elevated since Summer [name] surgery   OSA: not using CPAP machine, she states she can't tolerate it. Explained that it may be the cause of fatigue, try to keep it on for at least 4 hours per night, and it may also help decrease headache episodes. She is still not using it.   Hypothyroidism: taking Levothyroxine as prescribed,  weight has been stable, she is always tired, no hair loss.   Tardive Dyskensia: it has improved since Lyrica was changed from one pill twice daily to two qhs, mouth movements not as frequent or intense.   DM with neuropathy: currently seeing Endocrinologist at [facility] ( [facility] ) and last hbgA1C was 7.6. Glucose seems to be under better control and will have Summer [name] surgery soon.  Summer [name] surgery : seen by Dr. Wagnor, surgery had to be changed because her bones were very fragile, pathology back and not cancer - possible Charcot's Summer [name]    Patient Active Problem List   Diagnosis Date Noted  . Hypertriglyceridemia 07/27/2015  . Acquired abduction deformity of Summer [name] 07/12/2015  . Osteoarthritis of subtalar joint 07/12/2015  . Polyneuropathy in diseases classified elsewhere ([name]) 07/12/2015  . Arthritis of Summer [name], degenerative 07/12/2015  . Carpal tunnel syndrome 04/17/2015  . Chronic constipation 04/17/2015  . Insomnia, persistent 04/17/2015  . Chronic kidney disease (CKD), stage III (moderate) 04/17/2015  . Decreased exercise tolerance 04/17/2015  . Diabetes mellitus with polyneuropathy ([name]) 04/17/2015  . Gastro-esophageal reflux disease without esophagitis 04/17/2015  . Bursitis, trochanteric 04/17/2015  . Cephalalgia 04/17/2015  . Benign hypertension 04/17/2015  . Adult hypothyroidism 04/17/2015  . Hearing loss 04/17/2015  . Chronic recurrent major depressive disorder ([name]) 04/17/2015  . Neurogenic claudication 04/17/2015  . Obesity (  BMI 35.0-39.9 without comorbidity) ([place]) 04/17/2015  . Hypo-ovarianism 04/17/2015  . Perennial allergic rhinitis with seasonal variation 04/17/2015  . Acne erythematosa 04/17/2015  . Dyskinesia, tardive 04/17/2015  . Memory loss 04/17/2015  . Impingement syndrome of shoulder 04/17/2015  . Dermatitis, stasis 04/17/2015  . Obstructive sleep apnea 05/14/2014  . Dyspnea 05/06/2014  . Hyperlipidemia 02/06/2012  . LBP (low back pain) 09/16/2008    Past  Surgical History  Procedure Laterality Date  . Tubal ligation    . Vaginal hysterectomy  1989  . Neck surgery    . Cataract extraction  01/2011    right  . Eye lid surgery  2013    bilateral    Family History  Problem Relation Age of Onset  . Heart attack Mother   . Aneurysm Mother     Social History   Social History  . Marital Status: Widowed    Spouse Name: N/A  . Number of Children: N/A  . Years of Education: N/A   Occupational History  . Not on file.   Social History Main Topics  . Smoking status: Never Smoker   . Smokeless tobacco: Never Used  . Alcohol Use: 0.0 - 0.6 oz/week    0 Standard drinks or equivalent, 0-1 Glasses of wine per week  . Drug Use: No  . Sexual Activity:    Partners: Male   Other Topics Concern  . Not on file   Social History Narrative     Current outpatient prescriptions:  .  amitriptyline (ELAVIL) 25 MG tablet, Take 1 tablet (25 mg total) by mouth at bedtime., Disp: 90 tablet, Rfl: 1 .  Blood Glucose Monitoring Suppl (ACCU-CHEK AVIVA PLUS) w/Device KIT, , Disp: , Rfl:  .  cephALEXin (KEFLEX) 500 MG capsule, Take 500 mg by mouth 3 (three) times daily., Disp: , Rfl:  .  Cyanocobalamin 2500 MCG CHEW, Chew by mouth daily., Disp: , Rfl:  .  diclofenac sodium (VOLTAREN) 1 % GEL, Apply topically as needed., Disp: , Rfl:  .  Glucose Blood (BLOOD GLUCOSE TEST STRIPS) STRP, , Disp: , Rfl:  .  Insulin Glargine (LANTUS SOLOSTAR) 100 UNIT/ML Solostar Pen, Inject 50 Units into the skin daily at 10 pm., Disp: 15 mL, Rfl: 2 .  Insulin Pen Needle (RELION PEN NEEDLE 31G/8MM) 31G X 8 MM MISC, by Does not apply route 2 (two) times daily., Disp: , Rfl:  .  insulin regular (NOVOLIN R,HUMULIN R) 100 units/mL injection, Inject 0.1 mLs (10 Units total) into the skin 3 (three) times daily before meals., Disp: 10 mL, Rfl: 0 .  lamoTRIgine (LAMICTAL) 25 MG tablet, Take 2 tablets (50 mg total) by mouth daily., Disp: 180 tablet, Rfl: 1 .  levothyroxine (SYNTHROID,  LEVOTHROID) 25 MCG tablet, Take 1 tablet (25 mcg total) by mouth daily., Disp: 90 tablet, Rfl: 0 .  losartan-hydrochlorothiazide (HYZAAR) 100-12.5 MG tablet, Take 1 tablet by mouth daily., Disp: 90 tablet, Rfl: 0 .  metFORMIN (GLUCOPHAGE-XR) 500 MG 24 hr tablet, Take 1 tablet (500 mg total) by mouth daily with breakfast., Disp: 180 tablet, Rfl: 1 .  metoprolol tartrate (LOPRESSOR) 25 MG tablet, Take 1 tablet (25 mg total) by mouth 2 (two) times daily., Disp: 180 tablet, Rfl: 1 .  montelukast (SINGULAIR) 10 MG tablet, Take 1 tablet (10 mg total) by mouth daily., Disp: 90 tablet, Rfl: 1 .  omega-3 acid ethyl esters (LOVAZA) 1 G capsule, Take 1 capsule (1 g total) by mouth 2 (two) times daily., Disp: 60 capsule,  Rfl: 5 .  omeprazole (PRILOSEC) 40 MG capsule, Take 1 capsule (40 mg total) by mouth daily., Disp: 90 capsule, Rfl: 1 .  pravastatin (PRAVACHOL) 80 MG tablet, Take 1 tablet (80 mg total) by mouth daily., Disp: 90 tablet, Rfl: 1 .  pregabalin (LYRICA) 300 MG capsule, Take 2 capsules (600 mg total) by mouth daily., Disp: 180 capsule, Rfl: 1 .  ranitidine (ZANTAC) 150 MG tablet, Take 150 mg by mouth at bedtime., Disp: , Rfl:  .  RELION INSULIN SYRINGE 1ML/31G 31G X 5/16" 1 ML MISC, , Disp: , Rfl:  .  sertraline (ZOLOFT) 100 MG tablet, Take 1 tablet (100 mg total) by mouth daily., Disp: 90 tablet, Rfl: 1 .  VITAMIN D, CHOLECALCIFEROL, PO, Take 20,000 Units by mouth daily., Disp: , Rfl:  .  zolpidem (AMBIEN) 10 MG tablet, Take 1 tablet (10 mg total) by mouth at bedtime., Disp: 90 tablet, Rfl: 0  Allergies  Allergen Reactions  . Atorvastatin     muscle pain  . Codeine   . Latex Rash     ROS  Constitutional: Negative for fever , positive for  weight change.  Respiratory: Negative for cough , stable shortness of breath.   Cardiovascular: Negative for chest pain or palpitations.  Gastrointestinal: Negative for abdominal pain, no bowel changes.  Musculoskeletal: Positive  for gait problem   Skin: Negative for rash.  Neurological: Negative for dizziness , positive for headache.  No other specific complaints in a complete review of systems (except as listed in HPI above).  Objective  Filed Vitals:   01/04/16 1358  BP: 144/72  Pulse: 95  Temp: 98.9 F (37.2 C)  TempSrc: Oral  Resp: 16  Weight: 221 lb 4.8 oz (100.381 kg)  SpO2: 96%    Body mass index is 40.47 kg/(m^2).  Physical Exam  Constitutional: Patient appears well-developed and well-nourished. Obese  No distress.  HEENT: head atraumatic, normocephalic, pupils equal and reactive to light, , neck supple, throat within normal limits Cardiovascular: Normal rate, regular rhythm and normal heart sounds.  No murmur heard. Trace BLE edema. Pulmonary/Chest: Effort normal and breath sounds normal. No respiratory distress. Abdominal: Soft.  There is no tenderness. Psychiatric: Patient has a normal mood and affect. behavior is normal. Judgment and thought content normal. Neurology: tardive dyskinesia movements Muscular Skeletal: ortho shoe on left Summer [name]  Recent Results (from the past 2160 hour(s))  Hemoglobin A1c     Status: Abnormal   Collection Time: 11/23/15 11:53 AM  Result Value Ref Range   Hgb A1c MFr Bld 8.7 (H) 4.8 - 5.6 %    Comment:          Pre-diabetes: 5.7 - 6.4          Diabetes: >6.4          Glycemic control for adults with diabetes: <7.0    Est. average glucose Bld gHb Est-mCnc 203 mg/dL  Hemoglobin A1c     Status: Abnormal   Collection Time: 12/15/15  9:22 AM  Result Value Ref Range   Hgb A1c MFr Bld 7.9 (H) 4.8 - 5.6 %    Comment:          Pre-diabetes: 5.7 - 6.4          Diabetes: >6.4          Glycemic control for adults with diabetes: <7.0    Est. average glucose Bld gHb Est-mCnc 180 mg/dL      PHQ2/9: Depression screen PHQ 2/9 01/04/2016 07/20/2015   04/20/2015  Decreased Interest 1 0 3  Down, Depressed, Hopeless 2 1 3  PHQ - 2 Score 3 1 6  Altered sleeping 3 - 3  Tired, decreased  energy 1 - 3  Change in appetite 1 - 3  Feeling bad or failure about yourself  0 - 3  Trouble concentrating 1 - 3  Moving slowly or fidgety/restless 0 - 3  Suicidal thoughts 0 - 3  PHQ-9 Score 9 - 27  Difficult doing work/chores - - Very difficult    Fall Risk: Fall Risk  01/04/2016 07/20/2015 04/20/2015  Falls in the past year? Yes Yes No  Number falls in past yr: 1 1 -  Injury with Fall? No No -     Functional Status Survey: Is the patient deaf or have difficulty hearing?: No Does the patient have difficulty seeing, even when wearing glasses/contacts?: No Does the patient have difficulty concentrating, remembering, or making decisions?: No Does the patient have difficulty walking or climbing stairs?: Yes (patient recently had surgery on left Summer [name]. patient has a f/u on Thursday with Dr. Wagoner.) Does the patient have difficulty dressing or bathing?: No Does the patient have difficulty doing errands alone such as visiting a doctor's office or shopping?: No   Assessment & Plan  1. Other specified hypothyroidism  - TSH  2. Chronic recurrent major depressive disorder ([name])  Explained that when she was seeing Dr. Fahem and therapist her depression was better controlled, and she needs to reconsider  3. Dyskinesia, tardive  stable  4. Diabetic polyneuropathy associated with type 2 diabetes mellitus ([name])  - Insulin Glargine (LANTUS SOLOSTAR) 100 UNIT/ML Solostar Pen; Inject 50 Units into the skin daily at 10 pm.  Dispense: 15 mL; Refill: 2 - Comprehensive metabolic panel  5. Benign hypertension  - losartan-hydrochlorothiazide (HYZAAR) 100-12.5 MG tablet; Take 1 tablet by mouth daily.  Dispense: 90 tablet; Refill: 0 - metoprolol tartrate (LOPRESSOR) 25 MG tablet; Take 1 tablet (25 mg total) by mouth 2 (two) times daily.  Dispense: 180 tablet; Refill: 1 - Comprehensive metabolic panel  6. Insomnia  - zolpidem (AMBIEN) 10 MG tablet; Take 1 tablet (10 mg total) by mouth at  bedtime.  Dispense: 90 tablet; Refill: 0 - amitriptyline (ELAVIL) 25 MG tablet; Take 1 tablet (25 mg total) by mouth at bedtime.  Dispense: 90 tablet; Refill: 1  7. Dyslipidemia  - Lipid panel  8. Chronic tension-type headache, not intractable  Needs to stop caffeine and also pain medication  - amitriptyline (ELAVIL) 25 MG tablet; Take 1 tablet (25 mg total) by mouth at bedtime.  Dispense: 90 tablet; Refill: 1   

## 2016-01-06 ENCOUNTER — Encounter: Payer: Self-pay | Admitting: Podiatry

## 2016-01-06 ENCOUNTER — Ambulatory Visit (INDEPENDENT_AMBULATORY_CARE_PROVIDER_SITE_OTHER): Payer: Commercial Managed Care - HMO | Admitting: Podiatry

## 2016-01-06 VITALS — BP 168/92 | HR 81 | Resp 18

## 2016-01-06 DIAGNOSIS — Z09 Encounter for follow-up examination after completed treatment for conditions other than malignant neoplasm: Secondary | ICD-10-CM

## 2016-01-06 DIAGNOSIS — E11610 Type 2 diabetes mellitus with diabetic neuropathic arthropathy: Secondary | ICD-10-CM

## 2016-01-10 ENCOUNTER — Encounter: Payer: Self-pay | Admitting: Podiatry

## 2016-01-10 NOTE — Progress Notes (Signed)
Patient ID: Summer [name], female   DOB: 12-21-1950, 65 y.o.   MRN: [id number]  Subjective: Summer [name] is a 65 y.o. is seen today in office s/p left foot bone biopsy. She states that she has not had any pain however this is likely due to neuropathy. She is continuing the surgical shoe as she is able to wear cam boot due to deformity of her foot. She has limited her weightbearing and she uses a knee scooter at times. Denies any systemic complaints such as fevers, chills, nausea, vomiting. No calf pain, chest pain, shortness of breath.   Objective: General: No acute distress, AAOx3  DP/PT pulses palpable 2/4, CRT < 3 sec to all digits.  Protective sensation intact. Motor function intact.  Left foot: Incision is well coapted without any evidence of dehiscence and staples are intact. There is no surrounding erythema, ascending cellulitis, fluctuance, crepitus, malodor, drainage/purulence. There is mild edema around the surgical site. There is no pain along the surgical site.  She has significant flatfoot deformity and forefoot abduction with talar head prominence on the plantar medial aspect of the foot. No significant equinus is present. No other areas of tenderness to bilateral lower extremities.  No other open lesions or pre-ulcerative lesions.  No pain with calf compression, swelling, warmth, erythema.   Assessment and Plan:  Status post left foot bone biospy, doing well  -Treatment options discussed including all alternatives, risks, and complications -Antibiotic ointment was applied over the incision followed by a dressing. Keep dressing clean, dry, intact. -Ice/elevation -Pain medication as needed. -Weight-bear as tolerated in surgical shoe. -Monitor for any clinical signs or symptoms of infection and DVT/PE and directed to call the office immediately should any occur or go to the ER. -Follow-up in 10 days for staple removal or sooner if any problems arise. In the meantime, encouraged to  call the office with any questions, concerns, change in symptoms.    *I did speak to the pathologist who examined he bone specimens. He states that the reactive bone is nonspecific. Unsure of etiology however this could be related to Charcot. He states that the underlying bone is viable and there was no evidence of malignancy and no inflammation to support osteomyelitis.     [name], DPM

## 2016-01-11 ENCOUNTER — Other Ambulatory Visit: Payer: Self-pay | Admitting: Podiatry

## 2016-01-11 DIAGNOSIS — E785 Hyperlipidemia, unspecified: Secondary | ICD-10-CM | POA: Diagnosis not present

## 2016-01-11 DIAGNOSIS — E1142 Type 2 diabetes mellitus with diabetic polyneuropathy: Secondary | ICD-10-CM | POA: Diagnosis not present

## 2016-01-11 DIAGNOSIS — Z01818 Encounter for other preprocedural examination: Secondary | ICD-10-CM | POA: Diagnosis not present

## 2016-01-11 DIAGNOSIS — E038 Other specified hypothyroidism: Secondary | ICD-10-CM | POA: Diagnosis not present

## 2016-01-11 DIAGNOSIS — I10 Essential (primary) hypertension: Secondary | ICD-10-CM | POA: Diagnosis not present

## 2016-01-12 LAB — HEMOGLOBIN A1C
Est. average glucose Bld gHb Est-mCnc: 151 mg/dL
Hgb A1c MFr Bld: 6.9 % — ABNORMAL HIGH (ref 4.8–5.6)

## 2016-01-12 LAB — COMPREHENSIVE METABOLIC PANEL
ALT: 14 IU/L (ref 0–32)
AST: 17 IU/L (ref 0–40)
Albumin/Globulin Ratio: 2.1 (ref 1.2–2.2)
Albumin: 4.2 g/dL (ref 3.6–4.8)
Alkaline Phosphatase: 79 IU/L (ref 39–117)
BUN/Creatinine Ratio: 45 — ABNORMAL HIGH (ref 12–28)
BUN: 42 mg/dL — ABNORMAL HIGH (ref 8–27)
Bilirubin Total: 0.2 mg/dL (ref 0.0–1.2)
CO2: 23 mmol/L (ref 18–29)
Calcium: 9.7 mg/dL (ref 8.7–10.3)
Chloride: 103 mmol/L (ref 96–106)
Creatinine, Ser: 0.94 mg/dL (ref 0.57–1.00)
GFR calc Af Amer: 73 mL/min/{1.73_m2} (ref >59)
GFR calc non Af Amer: 63 mL/min/{1.73_m2} (ref >59)
Globulin, Total: 2 g/dL (ref 1.5–4.5)
Glucose: 112 mg/dL — ABNORMAL HIGH (ref 65–99)
Potassium: 4.7 mmol/L (ref 3.5–5.2)
Sodium: 143 mmol/L (ref 134–144)
Total Protein: 6.2 g/dL (ref 6.0–8.5)

## 2016-01-12 LAB — TSH: TSH: 3.91 u[IU]/mL (ref 0.450–4.500)

## 2016-01-12 LAB — LIPID PANEL
Chol/HDL Ratio: 7.3 ratio units — ABNORMAL HIGH (ref 0.0–4.4)
Cholesterol, Total: 254 mg/dL — ABNORMAL HIGH (ref 100–199)
HDL: 35 mg/dL — ABNORMAL LOW (ref >39)
Triglycerides: 636 mg/dL (ref 0–149)

## 2016-01-13 ENCOUNTER — Other Ambulatory Visit: Payer: Self-pay | Admitting: Family Medicine

## 2016-01-13 MED ORDER — LEVOTHYROXINE SODIUM 25 MCG PO TABS: 25.0000 ug | ORAL_TABLET | Freq: Every day | ORAL | Status: DC

## 2016-01-14 ENCOUNTER — Other Ambulatory Visit: Payer: Self-pay

## 2016-01-14 ENCOUNTER — Other Ambulatory Visit: Payer: Self-pay | Admitting: Family Medicine

## 2016-01-14 DIAGNOSIS — E785 Hyperlipidemia, unspecified: Secondary | ICD-10-CM

## 2016-01-14 MED ORDER — OMEGA-3-ACID ETHYL ESTERS 1 G PO CAPS: 1.0000 g | ORAL_CAPSULE | Freq: Two times a day (BID) | ORAL | Status: DC

## 2016-01-14 NOTE — Progress Notes (Signed)
lovaza is omega 3, she needs to take it, risk of pancreatitis with high triglyceries

## 2016-01-18 ENCOUNTER — Encounter: Payer: Self-pay | Admitting: Podiatry

## 2016-01-18 ENCOUNTER — Ambulatory Visit (INDEPENDENT_AMBULATORY_CARE_PROVIDER_SITE_OTHER): Payer: Commercial Managed Care - HMO | Admitting: Podiatry

## 2016-01-18 DIAGNOSIS — E11610 Type 2 diabetes mellitus with diabetic neuropathic arthropathy: Secondary | ICD-10-CM

## 2016-01-18 DIAGNOSIS — Z09 Encounter for follow-up examination after completed treatment for conditions other than malignant neoplasm: Secondary | ICD-10-CM

## 2016-01-18 MED ORDER — AMOXICILLIN-POT CLAVULANATE 875-125 MG PO TABS: 1.0000 | ORAL_TABLET | Freq: Two times a day (BID) | ORAL | Status: DC

## 2016-01-18 NOTE — Progress Notes (Signed)
Patient ID: Summer [name], female   DOB: [birth year], 65 y.o.   MRN: [id number]  Subjective: Summer [name] is a 65 y.o. is seen today in office s/p left foot bone biopsy. Since that she is doing well and she is not any fevers, chills, nausea, vomiting. No calf pain, chest pain, joints of breath. She gets some pain in the front of her ankle into the bottom of her heel however. Denies any recent injury or trauma.   Objective: General: No acute distress, AAOx3  DP/PT pulses palpable 2/4, CRT < 3 sec to all digits.  Protective sensation intact. Motor function intact.  Left foot: Incision is well coapted without any evidence of dehiscence and staples are intact. On the medial incision is localized erythema around the incision and extending past A centimeter. There is no ascending cellulitis. Small amounts of serous drainage is expressed however there is no pus. Slight movement across incision medially however laterally the incision is well coapted without any evidence of dehiscence of there is no surrounding edema, erythema, drainage or pus. No tenderness on the incisions. No other areas of tenderness to bilateral lower extremities.  No other open lesions or pre-ulcerative lesions.  No pain with calf compression, swelling, warmth, erythema.   Assessment and Plan:  Status post left foot bone biospy, with localized erythema along the medial incision -Able to removed on the lateral incision. However due to the medial incision with localized erythema as well as some small lump drainage will go ahead and start antibiotics. Prescribed Augmentin. Continue with antibiotic ointment dressing changes. Monitor for signs or symptoms of worsening infection. The surgical shoe. I would for her to be in a boot however given the deformity to her foot she can not able to do this. -If there is any signs or symptoms of worsening infection she is to immediately to the emergency room. -Awaiting consult for possible future surgery.   -Follow up in one week. Call any questions or concerns in the meantime   [name], DPM

## 2016-01-19 ENCOUNTER — Telehealth: Payer: Self-pay | Admitting: Family Medicine

## 2016-01-19 ENCOUNTER — Other Ambulatory Visit: Payer: Self-pay | Admitting: Family Medicine

## 2016-01-19 DIAGNOSIS — M216X2 Other acquired deformities of left foot: Secondary | ICD-10-CM

## 2016-01-19 DIAGNOSIS — M19072 Primary osteoarthritis, left ankle and foot: Secondary | ICD-10-CM

## 2016-01-19 NOTE — Telephone Encounter (Signed)
Patient is currently seeing Dr Earleen Newport (podiatrist) and he is wanting her to see Dr Doran Durand ([place]).

## 2016-01-20 NOTE — Telephone Encounter (Signed)
Referral has been placed and the requested information was faxed to their office at [phone].

## 2016-01-25 ENCOUNTER — Ambulatory Visit (INDEPENDENT_AMBULATORY_CARE_PROVIDER_SITE_OTHER): Payer: Commercial Managed Care - HMO | Admitting: Podiatry

## 2016-01-25 ENCOUNTER — Encounter: Payer: Self-pay | Admitting: Podiatry

## 2016-01-25 VITALS — BP 128/72 | HR 85 | Temp 98.2°F | Resp 18

## 2016-01-25 DIAGNOSIS — Z09 Encounter for follow-up examination after completed treatment for conditions other than malignant neoplasm: Secondary | ICD-10-CM

## 2016-01-25 DIAGNOSIS — E11610 Type 2 diabetes mellitus with diabetic neuropathic arthropathy: Secondary | ICD-10-CM

## 2016-01-26 NOTE — Progress Notes (Signed)
Patient ID: Summer [name], female   DOB: 02/07/1951, 65 y.o.   MRN: [id number]  Subjective: Summer [name] is a 65 y.o. is seen today in office s/p left foot bone biopsy. He has been on antibiotics since last appointment. She says the redness has decreased on the inside aspect of her foot. She presents today remove the remaining of the staples. Denies any red streaks. Denies any fevers, chills, nausea, vomiting. No calf pain, chest pain, joints of breath. She gets some pain in the front of her ankle into the bottom of her heel however. Denies any recent injury or trauma.   Objective: General: No acute distress, AAOx3  DP/PT pulses palpable 2/4, CRT < 3 sec to all digits.  Protective sensation intact. Motor function intact.  Left foot: Incision is well coapted without any evidence of dehiscence and staples are intact along the medial incision. There is some residual erythema directly around the incision how there is no ascending cellulitis and erythema appears to be resolving. Small and is serous drainage expressed there is no pus. No before meals cellulitis. No increase in warmth. Minimal edema around the surgical sites. Lateral incision appears to be healing well. Continued deformity left foot. No other areas of tenderness to bilateral lower extremities.  No other open lesions or pre-ulcerative lesions.  No pain with calf compression, swelling, warmth, erythema.   Assessment and Plan:  Status post left foot bone biospy, with localized erythema along the medial incision which is resolving  -Remainder the staples are removed today. Continue antibiotic for 1 more week. Continue with and the buttock ointment dressing changes daily. Steri-Strip applied along the medial incision. Continue surgical shoe. Monitoring signs or symptoms of worsening infection threaded to call the office or go to the ER immediately should any occur. Also her back in 1 week. She began referral with Dr. Doran Durand and she is going to see  him the beginning of June.   [name], DPM

## 2016-01-27 DIAGNOSIS — Z794 Long term (current) use of insulin: Secondary | ICD-10-CM | POA: Diagnosis not present

## 2016-01-27 DIAGNOSIS — E1165 Type 2 diabetes mellitus with hyperglycemia: Secondary | ICD-10-CM | POA: Diagnosis not present

## 2016-01-28 ENCOUNTER — Encounter: Payer: Self-pay | Admitting: Podiatry

## 2016-02-01 ENCOUNTER — Encounter: Payer: Self-pay | Admitting: Podiatry

## 2016-02-01 ENCOUNTER — Ambulatory Visit (INDEPENDENT_AMBULATORY_CARE_PROVIDER_SITE_OTHER): Payer: Commercial Managed Care - HMO | Admitting: Podiatry

## 2016-02-01 DIAGNOSIS — E11610 Type 2 diabetes mellitus with diabetic neuropathic arthropathy: Secondary | ICD-10-CM

## 2016-02-01 DIAGNOSIS — Z09 Encounter for follow-up examination after completed treatment for conditions other than malignant neoplasm: Secondary | ICD-10-CM

## 2016-02-01 NOTE — Progress Notes (Signed)
Patient ID: Summer [name], female   DOB: July 25, 1951, 65 y.o.   MRN: [id number]  Subjective: Summer [name] is a 65 y.o. is seen today in office s/p left foot bone biopsy. He has been on antibiotics since last appointment. She has not changed the bandage since last appointment. She states her blood sugar has been up into the 400's but she believes this is from a change in her medications. She stopped the medicine.  Denies any fevers, chills, nausea, vomiting. No calf pain, chest pain, shortness of breath. Denies any recent injury or trauma.   Objective: General: No acute distress, AAOx3  DP/PT pulses palpable 2/4, CRT < 3 sec to all digits.  Protective sensation intact. Motor function intact.  Left foot: Incision is well coapted without any evidence of dehiscence along the lateral incision. On the medial incision has a very superficial area of wound dehiscence with a granular wound base. There is no probing, undermining or tunneling. There is decreased erythema around the incision is no ascending synovitis. There is no fluctuance or crepitus. No malodor. No pus. No tenderness on the surgical sites or other areas of the foot. Continued deformity left foot. No other areas of tenderness to bilateral lower extremities.  No other open lesions or pre-ulcerative lesions.  No pain with calf compression, swelling, warmth, erythema.   Assessment and Plan:  Status post left foot bone biospy, with decreased erythema along medial incision -Treatment options discussed including all alternatives, risks, and complications -Dressing was changed today. Superficial area of dehiscence was debrided to a granular bleeding wound base. Continue daily dressing changes in the antibiotic ointment. -Finish course of antibiotics. -Continue surgical shoe for now. -Monitor for any clinical signs or symptoms of infection and directed to call the office immediately should any occur or go to the ER. -Follow-up after evaluation with  Dr. Doran Durand next week or sooner if any problems arise. In the meantime, encouraged to call the office with any questions, concerns, change in symptoms.    [name], DPM

## 2016-02-02 DIAGNOSIS — E781 Pure hyperglyceridemia: Secondary | ICD-10-CM | POA: Diagnosis not present

## 2016-02-02 DIAGNOSIS — E6609 Other obesity due to excess calories: Secondary | ICD-10-CM | POA: Diagnosis not present

## 2016-02-02 DIAGNOSIS — Z79899 Other long term (current) drug therapy: Secondary | ICD-10-CM | POA: Diagnosis not present

## 2016-02-02 DIAGNOSIS — Z794 Long term (current) use of insulin: Secondary | ICD-10-CM | POA: Diagnosis not present

## 2016-02-02 DIAGNOSIS — E1165 Type 2 diabetes mellitus with hyperglycemia: Secondary | ICD-10-CM | POA: Diagnosis not present

## 2016-02-04 ENCOUNTER — Encounter: Payer: Self-pay | Admitting: Podiatry

## 2016-02-11 DIAGNOSIS — M6720 Synovial hypertrophy, not elsewhere classified, unspecified site: Secondary | ICD-10-CM | POA: Diagnosis not present

## 2016-02-11 DIAGNOSIS — E11610 Type 2 diabetes mellitus with diabetic neuropathic arthropathy: Secondary | ICD-10-CM | POA: Diagnosis not present

## 2016-02-18 ENCOUNTER — Encounter: Payer: Self-pay | Admitting: Family Medicine

## 2016-02-18 DIAGNOSIS — E785 Hyperlipidemia, unspecified: Secondary | ICD-10-CM | POA: Diagnosis not present

## 2016-02-19 LAB — TSH: TSH: 3.54 u[IU]/mL (ref 0.450–4.500)

## 2016-02-21 ENCOUNTER — Telehealth: Payer: Self-pay | Admitting: *Deleted

## 2016-02-21 NOTE — Telephone Encounter (Signed)
Called patient and stated that Dr Jacqualyn Posey was checking on patient after she went to the orthopaedic doctor and the patient states that the orthopaedic doctor states that there is nothing that he can do and there is no need to do any operation on the patient and patient states that she will be in to see [place] in the [place] office soon and I stated to the patient to call of any questions or concerns arise. Lattie Haw

## 2016-02-22 ENCOUNTER — Ambulatory Visit (INDEPENDENT_AMBULATORY_CARE_PROVIDER_SITE_OTHER): Payer: Commercial Managed Care - HMO | Admitting: Family Medicine

## 2016-02-22 ENCOUNTER — Encounter: Payer: Self-pay | Admitting: Family Medicine

## 2016-02-22 VITALS — BP 124/66 | HR 93 | Temp 98.8°F | Resp 18 | Ht 62.0 in | Wt 223.1 lb

## 2016-02-22 DIAGNOSIS — Z0000 Encounter for general adult medical examination without abnormal findings: Secondary | ICD-10-CM | POA: Diagnosis not present

## 2016-02-22 DIAGNOSIS — F339 Major depressive disorder, recurrent, unspecified: Secondary | ICD-10-CM

## 2016-02-22 DIAGNOSIS — E11610 Type 2 diabetes mellitus with diabetic neuropathic arthropathy: Secondary | ICD-10-CM | POA: Diagnosis not present

## 2016-02-22 DIAGNOSIS — R0602 Shortness of breath: Secondary | ICD-10-CM | POA: Diagnosis not present

## 2016-02-22 NOTE — Progress Notes (Signed)
Name: Summer [name]   MRN: [id number]    DOB: 04-01-1951   Date:02/22/2016       Progress Note  Subjective  Chief Complaint  Chief Complaint  Patient presents with  . 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  Preventative care, Health maintenance, Preventative health measures discussed.  Preventative screenings discussed today: lab work, colonoscopy,  mammogram, DEXA.  Low Dose CT Chest recommended if Age 42-80 years, 30 pack-year currently smoking OR have quit w/in 15years.   Lifestyle risk factor issued reviewed: Diet, exercise, weight management, advised patient smoking is not healthy, nutrition/diet.  Preventative health measures discussed (5-10 year plan).  Reviewed and recommended vaccinations: - Pneumovax  - Prevnar  - Annual Influenza - Zostavax - Tdap   Depression screening: taking medication for depression  Fall risk screening: Done Discuss ADLs/IADLs: Done  Current medical providers: See HPI  Other health risk factors identified this visit: DM, charcot foot Cognitive impairment issues: None identified  All above discussed with patient. Appropriate education, counseling and referral will be made based upon the above.   DMII with Charcot foot: told this past week by Podiatrist that she will eventually be unable to walk/bear weight on left foot. She has been trying to keep DM under control. Very upset about outcome but dealing better with it now.   Chronic Depression: upset about Charcot foot but has a supportive boyfriend ( living together for the past 22 years ) and  also friends , taking medication and denies side effects  Decrease in exercise tolerance: she has seen cardiologist and pulmonologist. Had spirometry and stress test, and likely secondary to inactivity and also obesity.   Dyslipidemia: triglycerides was elevated, taking medication, reviewed diet again - needs to follow diet.    Patient Active Problem List   Diagnosis Date Noted  . Charcot foot due to diabetes mellitus ([place]) 02/22/2016  . Hypertriglyceridemia 07/27/2015  . Acquired abduction deformity of foot 07/12/2015  . Osteoarthritis of subtalar joint 07/12/2015  . Polyneuropathy in diseases classified elsewhere ([place]) 07/12/2015  . Arthritis of foot, degenerative 07/12/2015  . Carpal tunnel syndrome 04/17/2015  . Chronic constipation 04/17/2015  . Insomnia, persistent 04/17/2015  . Chronic kidney disease (CKD), stage III (moderate) 04/17/2015  . Decreased exercise tolerance 04/17/2015  . Diabetes mellitus with polyneuropathy ([place]) 04/17/2015  . Gastro-esophageal reflux disease without esophagitis 04/17/2015  . Bursitis, trochanteric 04/17/2015  . Cephalalgia 04/17/2015  . Benign hypertension 04/17/2015  . Adult hypothyroidism 04/17/2015  . Hearing loss 04/17/2015  . Chronic recurrent major depressive disorder ([place]) 04/17/2015  . Neurogenic claudication 04/17/2015  . Obesity (BMI 35.0-39.9 without comorbidity) ([place]) 04/17/2015  . Hypo-ovarianism 04/17/2015  . Perennial allergic rhinitis with seasonal variation 04/17/2015  . Acne erythematosa 04/17/2015  . Dyskinesia, tardive 04/17/2015  . Memory loss 04/17/2015  . Impingement syndrome of shoulder 04/17/2015  . Dermatitis, stasis 04/17/2015  . Obstructive sleep apnea 05/14/2014  . Dyspnea 05/06/2014  . Hyperlipidemia 02/06/2012  . LBP (low back pain) 09/16/2008    Past Surgical History  Procedure Laterality Date  . Tubal ligation    .  Vaginal hysterectomy  1989  . Neck surgery    . Cataract extraction  01/2011     right  . Eye lid surgery  2013    bilateral    Family History  Problem Relation Age of Onset  . Heart attack Mother   . Aneurysm Mother     Social History   Social History  . Marital Status: Widowed    Spouse Name: N/A  . Number of Children: N/A  . Years of Education: N/A   Occupational History  . Not on file.   Social History Main Topics  . Smoking status: Never Smoker   . Smokeless tobacco: Never Used  . Alcohol Use: 0.0 - 0.6 oz/week    0 Standard drinks or equivalent, 0-1 Glasses of wine per week  . Drug Use: No  . Sexual Activity:    Partners: Male   Other Topics Concern  . Not on file   Social History Narrative     Current outpatient prescriptions:  .  amitriptyline (ELAVIL) 25 MG tablet, Take 1 tablet (25 mg total) by mouth at bedtime., Disp: 90 tablet, Rfl: 1 .  aspirin EC 81 MG tablet, Take 1 tablet (81 mg total) by mouth daily., Disp: 30 tablet, Rfl: 0 .  Blood Glucose Monitoring Suppl (ACCU-CHEK AVIVA PLUS) w/Device KIT, , Disp: , Rfl:  .  gemfibrozil (LOPID) 600 MG tablet, , Disp: , Rfl:  .  Insulin Glargine (LANTUS SOLOSTAR) 100 UNIT/ML Solostar Pen, Inject 50 Units into the skin daily at 10 pm., Disp: 15 mL, Rfl: 2 .  Insulin Pen Needle (RELION PEN NEEDLE 31G/8MM) 31G X 8 MM MISC, by Does not apply route 2 (two) times daily., Disp: , Rfl:  .  insulin regular (NOVOLIN R,HUMULIN R) 100 units/mL injection, Inject 0.1 mLs (10 Units total) into the skin 3 (three) times daily before meals., Disp: 10 mL, Rfl: 0 .  lamoTRIgine (LAMICTAL) 25 MG tablet, TAKE 2 TABLETS EVERY DAY, Disp: 180 tablet, Rfl: 1 .  levothyroxine (SYNTHROID, LEVOTHROID) 25 MCG tablet, Take 1 tablet (25 mcg total) by mouth daily. And two on Sundays, Disp: 100 tablet, Rfl: 0 .  losartan-hydrochlorothiazide (HYZAAR) 100-12.5 MG tablet, Take 1 tablet by mouth daily., Disp: 90 tablet, Rfl: 0 .  metFORMIN (GLUCOPHAGE-XR) 500 MG 24 hr tablet, Take 1 tablet (500 mg total) by mouth daily with  breakfast., Disp: 180 tablet, Rfl: 1 .  metoprolol tartrate (LOPRESSOR) 25 MG tablet, Take 1 tablet (25 mg total) by mouth 2 (two) times daily., Disp: 180 tablet, Rfl: 1 .  montelukast (SINGULAIR) 10 MG tablet, Take 1 tablet (10 mg total) by mouth daily., Disp: 90 tablet, Rfl: 1 .  omeprazole (PRILOSEC) 40 MG capsule, Take 1 capsule (40 mg total) by mouth daily., Disp: 90 capsule, Rfl: 1 .  pravastatin (PRAVACHOL) 80 MG tablet, Take 1 tablet (80 mg total) by mouth daily., Disp: 90 tablet, Rfl: 1 .  pregabalin (LYRICA) 300 MG capsule, Take 2 capsules (600 mg total) by mouth daily., Disp: 180 capsule, Rfl: 1 .  ranitidine (ZANTAC) 150 MG tablet, Take 150 mg by mouth at bedtime., Disp: , Rfl:  .  RELION INSULIN SYRINGE 1ML/31G 31G X 5/16" 1 ML MISC, , Disp: , Rfl:  .  sertraline (ZOLOFT) 100 MG tablet, TAKE 1 TABLET EVERY DAY, Disp: 90 tablet, Rfl: 1 .  TRULICITY 2.63 FH/5.4TG SOPN, , Disp: , Rfl:  .  vitamin B-12 (CYANOCOBALAMIN) 500 MCG tablet, Take 500 mcg by mouth daily., Disp: ,  Rfl:  .  vitamin C (ASCORBIC ACID) 500 MG tablet, Take 500 mg by mouth daily., Disp: , Rfl:  .  VITAMIN D, CHOLECALCIFEROL, PO, Take 20,000 Units by mouth daily., Disp: , Rfl:  .  zolpidem (AMBIEN) 10 MG tablet, TAKE 1 TABLET AT BEDTIME, Disp: 90 tablet, Rfl: 0 .  diclofenac sodium (VOLTAREN) 1 % GEL, Apply topically as needed. Reported on 02/22/2016, Disp: , Rfl:  .  Glucose Blood (BLOOD GLUCOSE TEST STRIPS) STRP, , Disp: , Rfl:   Allergies  Allergen Reactions  . Atorvastatin     muscle pain  . Codeine   . Latex Rash     ROS  Constitutional: Negative for fever or significant  weight change.  Respiratory: Negative for cough , positive for  shortness of breath. ( negative cardiac evaluation)   Cardiovascular: Negative for chest pain or palpitations.  Gastrointestinal: Negative for abdominal pain, no bowel changes.  Musculoskeletal: Negative for gait problem, positive for left ankle  joint swelling.  Skin:  Negative for rash.  Neurological: Negative for dizziness, positive for  headache.  No other specific complaints in a complete review of systems (except as listed in HPI above).  Objective  Filed Vitals:   02/22/16 1102  BP: 124/66  Pulse: 93  Temp: 98.8 F (37.1 C)  TempSrc: Oral  Resp: 18  Height: _0  (1.575 m)  Weight: 223 lb 1.6 oz (101.197 kg)  SpO2: 96%    Body mass index is 40.79 kg/(m^2).  Physical Exam  Constitutional: Patient appears well-developed and obese. No distress.  HENT: Head: Normocephalic and atraumatic. Ears: B TMs ok, no erythema or effusion; Nose: Nose normal. Mouth/Throat: Oropharynx is clear and moist. No oropharyngeal exudate.  Eyes: Conjunctivae and EOM are normal. Pupils are equal, round, and reactive to light. No scleral icterus.  Neck: Normal range of motion. Neck supple. No JVD present. No thyromegaly present.  Cardiovascular: Normal rate, regular rhythm and normal heart sounds.  No murmur heard. No BLE edema. Pulmonary/Chest: Effort normal and breath sounds normal. No respiratory distress. Abdominal: Soft. Bowel sounds are normal, no distension. There is no tenderness. no masses Breast: no lumps or masses, no nipple discharge or rashes FEMALE GENITALIA:  Not done RECTAL: not done Musculoskeletal: left ankle is on a brace, left foot is abducted, using cane to assist with ambulation  Neurological: he is alert and oriented to person, place, and time. No cranial nerve deficit. Coordination, balance, strength, speech and gait are normal.  Skin: Skin is warm and dry. No rash noted. No erythema.  Psychiatric: Patient has a normal mood and affect. behavior is normal. Judgment and thought content normal.  Recent Results (from the past 2160 hour(s))  Hemoglobin A1c     Status: Abnormal   Collection Time: 12/15/15  9:22 AM  Result Value Ref Range   Hgb A1c MFr Bld 7.9 (H) 4.8 - 5.6 %    Comment:          Pre-diabetes: 5.7 - 6.4          Diabetes:  >6.4          Glycemic control for adults with diabetes: <7.0    Est. average glucose Bld gHb Est-mCnc 180 mg/dL  Lipid panel     Status: Abnormal   Collection Time: 01/11/16  8:28 AM  Result Value Ref Range   Cholesterol, Total 254 (H) 100 - 199 mg/dL   Triglycerides 636 (HH) 0 - 149 mg/dL   HDL 35 (L) >39  mg/dL   VLDL Cholesterol Cal Comment 5 - 40 mg/dL    Comment: The calculation for the VLDL cholesterol is not valid when triglyceride level is >400 mg/dL.    LDL Calculated Comment 0 - 99 mg/dL    Comment: Triglyceride result indicated is too high for an accurate LDL cholesterol estimation.    Chol/HDL Ratio 7.3 (H) 0.0 - 4.4 ratio units    Comment:                                   T. Chol/HDL Ratio                                             Men  Women                               1/2 Avg.Risk  3.4    3.3                                   Avg.Risk  5.0    4.4                                2X Avg.Risk  9.6    7.1                                3X Avg.Risk 23.4   11.0   Comprehensive metabolic panel     Status: Abnormal   Collection Time: 01/11/16  8:28 AM  Result Value Ref Range   Glucose 112 (H) 65 - 99 mg/dL   BUN 42 (H) 8 - 27 mg/dL   Creatinine, Ser 0.94 0.57 - 1.00 mg/dL   GFR calc non Af Amer 63 >59 mL/min/1.73   GFR calc Af Amer 73 >59 mL/min/1.73   BUN/Creatinine Ratio 45 (H) 12 - 28   Sodium 143 134 - 144 mmol/L   Potassium 4.7 3.5 - 5.2 mmol/L   Chloride 103 96 - 106 mmol/L   CO2 23 18 - 29 mmol/L   Calcium 9.7 8.7 - 10.3 mg/dL   Total Protein 6.2 6.0 - 8.5 g/dL   Albumin 4.2 3.6 - 4.8 g/dL   Globulin, Total 2.0 1.5 - 4.5 g/dL   Albumin/Globulin Ratio 2.1 1.2 - 2.2   Bilirubin Total 0.2 0.0 - 1.2 mg/dL   Alkaline Phosphatase 79 39 - 117 IU/L   AST 17 0 - 40 IU/L   ALT 14 0 - 32 IU/L  TSH     Status: None   Collection Time: 01/11/16  8:28 AM  Result Value Ref Range   TSH 3.910 0.450 - 4.500 uIU/mL  Hemoglobin A1c     Status: Abnormal   Collection  Time: 01/11/16  8:31 AM  Result Value Ref Range   Hgb A1c MFr Bld 6.9 (H) 4.8 - 5.6 %    Comment:          Pre-diabetes: 5.7 - 6.4          Diabetes: >6.4          Glycemic  control for adults with diabetes: <7.0    Est. average glucose Bld gHb Est-mCnc 151 mg/dL  TSH     Status: None   Collection Time: 02/18/16  8:13 AM  Result Value Ref Range   TSH 3.540 0.450 - 4.500 uIU/mL     PHQ2/9: Depression screen [facility] 2/9 02/22/2016 01/04/2016 07/20/2015 04/20/2015  Decreased Interest 0 1 0 3  Down, Depressed, Hopeless _0 PHQ - 2 Score _1 Altered sleeping 0 3 - 3  Tired, decreased energy 3 1 - 3  Change in appetite 0 1 - 3  Feeling bad or failure about yourself  1 0 - 3  Trouble concentrating 1 1 - 3  Moving slowly or fidgety/restless 0 0 - 3  Suicidal thoughts 0 0 - 3  PHQ-9 Score 6 9 - 27  Difficult doing work/chores - - - Very difficult     Fall Risk: Fall Risk  02/22/2016 01/04/2016 07/20/2015 04/20/2015  Falls in the past year? Yes Yes Yes No  Number falls in past yr: _2 -  Injury with Fall? Yes No No -     Functional Status Survey: Is the patient deaf or have difficulty hearing?: No Does the patient have difficulty seeing, even when wearing glasses/contacts?: No Does the patient have difficulty concentrating, remembering, or making decisions?: No Does the patient have difficulty walking or climbing stairs?: Yes (patient recently had surgery on left foot. patient has a f/u on Thursday with Dr. Jacqualyn Posey.) Does the patient have difficulty dressing or bathing?: No Does the patient have difficulty doing errands alone such as visiting a doctor's office or shopping?: No   Assessment & Plan  1. Medicare annual wellness visit, subsequent  Discussed importance of 150 minutes of physical activity weekly, eat two servings of fish weekly, eat one serving of tree nuts ( cashews, pistachios, pecans, almonds.Marland Kitchen) every other day, eat 6 servings of fruit/vegetables daily and drink  plenty of water and avoid sweet beverages.   2. Charcot foot due to diabetes mellitus ([place])  Seen by podiatrist, last hgbA1C was well controlled  3. Chronic recurrent major depressive disorder ([name])  Continue medication  4. SOB (shortness of breath) on exertion  Seen by Pulmonologist and Cardiologist over the past few years and does not want to go back

## 2016-02-29 ENCOUNTER — Ambulatory Visit (INDEPENDENT_AMBULATORY_CARE_PROVIDER_SITE_OTHER): Payer: Commercial Managed Care - HMO | Admitting: Podiatry

## 2016-02-29 ENCOUNTER — Encounter: Payer: Self-pay | Admitting: Podiatry

## 2016-02-29 DIAGNOSIS — E11610 Type 2 diabetes mellitus with diabetic neuropathic arthropathy: Secondary | ICD-10-CM

## 2016-03-01 NOTE — Progress Notes (Signed)
Patient ID: Summer [name], female   DOB: 15-Jan-1951, 65 y.o.   MRN: [id number]  Subjective: Summer [name] is a 65 y.o. is seen today in office s/p left foot bone biopsy. She is referred to Dr. Doran Durand for possible surgical intervention however he believes that the patient would not likely to have much relief after surgery. She is requesting another opinion. Unfortunately her scheduled surgery at [facility] are canceled due to insurance issues.she states that overall she is doing well and the incisions of healed well. Denies any fevers, chills, nausea, vomiting. No calf pain, chest pain, shortness of breath. Denies any recent injury or trauma.   Objective: General: No acute distress, AAOx3  DP/PT pulses palpable 2/4, CRT < 3 sec to all digits.  Protective sensation intact. Motor function intact.  Left foot: Incisions are well coapted and a scar has formed. There is no significant edema, erythema. There is no drainage or pus. No signs of infection at this time. There is chronic foot deformity, Charcot changes left foot which are unchanged. No other areas of tenderness to bilateral lower extremities.  No other open lesions or pre-ulcerative lesions.  No pain with calf compression, swelling, warmth, erythema.   Assessment and Plan:  Status post left foot bone biospy, incisions are healed and no signs of infection at this time. -Treatment options discussed including all alternatives, risks, and complications -at this time is still with that if she undergo surgical intervention would likely need external fixator given quality of bone. She'll attempt another opinion. I will can do this for her. I discussed with her surgical and conservative treatment and I reviewed Dr. Beatrix Shipper notes. -I'll see her back in the near future and I will contact her in regards to a further consult.  [name], DPM

## 2016-03-08 ENCOUNTER — Other Ambulatory Visit: Payer: Self-pay | Admitting: Family Medicine

## 2016-03-08 NOTE — Telephone Encounter (Signed)
Patient requesting refill. 

## 2016-03-09 ENCOUNTER — Telehealth: Payer: Self-pay | Admitting: *Deleted

## 2016-03-09 DIAGNOSIS — E11610 Type 2 diabetes mellitus with diabetic neuropathic arthropathy: Secondary | ICD-10-CM

## 2016-03-09 NOTE — Telephone Encounter (Addendum)
-----   Message from Trula Slade, DPM sent at 03/09/2016  7:22 AM EDT ----- Can you put in a consult for Dr. Prudy Feeler in [place] for possible ex-fix/Charcot? Thanks. Referral, pt clinicals and demographics faxed to Dr. Lovena Le office.

## 2016-03-11 ENCOUNTER — Other Ambulatory Visit: Payer: Self-pay | Admitting: Family Medicine

## 2016-03-15 ENCOUNTER — Other Ambulatory Visit: Payer: Self-pay | Admitting: Family Medicine

## 2016-03-15 ENCOUNTER — Ambulatory Visit (INDEPENDENT_AMBULATORY_CARE_PROVIDER_SITE_OTHER): Payer: Commercial Managed Care - HMO | Admitting: *Deleted

## 2016-03-15 DIAGNOSIS — M19072 Primary osteoarthritis, left ankle and foot: Secondary | ICD-10-CM

## 2016-03-15 DIAGNOSIS — E11610 Type 2 diabetes mellitus with diabetic neuropathic arthropathy: Secondary | ICD-10-CM

## 2016-03-15 DIAGNOSIS — Z872 Personal history of diseases of the skin and subcutaneous tissue: Secondary | ICD-10-CM

## 2016-03-15 DIAGNOSIS — E1140 Type 2 diabetes mellitus with diabetic neuropathy, unspecified: Secondary | ICD-10-CM

## 2016-03-15 NOTE — Telephone Encounter (Signed)
Patient requesting refill. 

## 2016-03-20 DIAGNOSIS — M79673 Pain in unspecified foot: Secondary | ICD-10-CM

## 2016-03-22 DIAGNOSIS — Z794 Long term (current) use of insulin: Secondary | ICD-10-CM | POA: Diagnosis not present

## 2016-03-22 DIAGNOSIS — E1165 Type 2 diabetes mellitus with hyperglycemia: Secondary | ICD-10-CM | POA: Diagnosis not present

## 2016-03-22 DIAGNOSIS — E781 Pure hyperglyceridemia: Secondary | ICD-10-CM | POA: Diagnosis not present

## 2016-03-22 DIAGNOSIS — E6609 Other obesity due to excess calories: Secondary | ICD-10-CM | POA: Diagnosis not present

## 2016-03-22 DIAGNOSIS — Z79899 Other long term (current) drug therapy: Secondary | ICD-10-CM | POA: Diagnosis not present

## 2016-03-31 NOTE — Progress Notes (Signed)
Measured for diabetic shoes and insoles. 

## 2016-04-03 ENCOUNTER — Other Ambulatory Visit: Payer: Self-pay | Admitting: Family Medicine

## 2016-04-04 DIAGNOSIS — M25572 Pain in left ankle and joints of left foot: Secondary | ICD-10-CM | POA: Diagnosis not present

## 2016-04-04 DIAGNOSIS — M21072 Valgus deformity, not elsewhere classified, left ankle: Secondary | ICD-10-CM | POA: Diagnosis not present

## 2016-04-04 DIAGNOSIS — M24572 Contracture, left ankle: Secondary | ICD-10-CM | POA: Diagnosis not present

## 2016-04-20 ENCOUNTER — Ambulatory Visit (INDEPENDENT_AMBULATORY_CARE_PROVIDER_SITE_OTHER): Payer: Commercial Managed Care - HMO | Admitting: Family Medicine

## 2016-04-20 ENCOUNTER — Encounter: Payer: Self-pay | Admitting: Family Medicine

## 2016-04-20 VITALS — BP 126/68 | HR 98 | Temp 97.9°F | Resp 18 | Ht 62.0 in | Wt 225.2 lb

## 2016-04-20 DIAGNOSIS — E11610 Type 2 diabetes mellitus with diabetic neuropathic arthropathy: Secondary | ICD-10-CM | POA: Diagnosis not present

## 2016-04-20 DIAGNOSIS — E1142 Type 2 diabetes mellitus with diabetic polyneuropathy: Secondary | ICD-10-CM | POA: Diagnosis not present

## 2016-04-20 DIAGNOSIS — Z01818 Encounter for other preprocedural examination: Secondary | ICD-10-CM | POA: Diagnosis not present

## 2016-04-20 LAB — POCT GLYCOSYLATED HEMOGLOBIN (HGB A1C): Hemoglobin A1C: 9.3

## 2016-04-20 NOTE — Progress Notes (Signed)
Name: Summer [name]   MRN: [id number]    DOB: 04-21-1951   Date:04/20/2016       Progress Note  Subjective  Chief Complaint  Chief Complaint  Patient presents with  . Surgical Clearance    [facility] in [facility] to have Left foot triple arthrodesis with internal and external fixation    HPI   DMII with Charcot foot: Dr. Jacqualyn Posey has referred her to Dr. Amalia Greenhouse in [facility] and is pending surgery until pre-op clearance. She sees Dr. Paula Compton ( Endocrinologist ), and hgbA1C was good 3 months ago, however even though she is following a diabetic diet, taking Lantus and Metformin and pre-meal insulin hgbA1C is higher today at 9.2%. I do not recommend surgery until hgbA1C is close to 7 to decrease risk of complications.   Decrease in exercise tolerance: she has seen cardiologist-  Dr. Cathie Olden  and pulmonologist Dr. Lake Bells both 2 years ago ( 04/2014 ). Had spirometry and stress test, and likely secondary to inactivity and also obesity. No recent episodes of chest pain, palpitation. Symptoms are stable.   Patient Active Problem List   Diagnosis Date Noted  . Charcot foot due to diabetes mellitus ([place]) 02/22/2016  . Hypertriglyceridemia 07/27/2015  . Acquired abduction deformity of foot 07/12/2015  . Osteoarthritis of subtalar joint 07/12/2015  . Polyneuropathy in diseases classified elsewhere ([place]) 07/12/2015  . Arthritis of foot, degenerative 07/12/2015  . Carpal tunnel syndrome 04/17/2015  . Chronic constipation 04/17/2015  . Insomnia, persistent 04/17/2015  . Chronic kidney disease (CKD), stage III (moderate) 04/17/2015  . Decreased exercise tolerance 04/17/2015  . Diabetes mellitus with polyneuropathy ([place]) 04/17/2015  . Gastro-esophageal reflux disease without esophagitis 04/17/2015  . Bursitis, trochanteric 04/17/2015  . Cephalalgia 04/17/2015  . Benign hypertension 04/17/2015  . Adult hypothyroidism 04/17/2015  . Hearing loss 04/17/2015  . Chronic recurrent  major depressive disorder ([place]) 04/17/2015  . Neurogenic claudication 04/17/2015  . Obesity (BMI 35.0-39.9 without comorbidity) ([place]) 04/17/2015  . Hypo-ovarianism 04/17/2015  . Perennial allergic rhinitis with seasonal variation 04/17/2015  . Acne erythematosa 04/17/2015  . Dyskinesia, tardive 04/17/2015  . Memory loss 04/17/2015  . Impingement syndrome of shoulder 04/17/2015  . Dermatitis, stasis 04/17/2015  . Obstructive sleep apnea 05/14/2014  . Dyspnea 05/06/2014  . Hyperlipidemia 02/06/2012  . LBP (low back pain) 09/16/2008    Past Surgical History:  Procedure Laterality Date  . CATARACT EXTRACTION  01/2011   right  . eye lid surgery  2013   bilateral  . NECK SURGERY    . TUBAL LIGATION    . VAGINAL HYSTERECTOMY  1989    Family History  Problem Relation Age of Onset  . Heart attack Mother   . Aneurysm Mother     Social History   Social History  . Marital status: Widowed    Spouse name: N/A  . Number of children: N/A  . Years of education: N/A   Occupational History  . Not on file.   Social History Main Topics  . Smoking status: Never Smoker  . Smokeless tobacco: Never Used  . Alcohol use 0.0 - 0.6 oz/week  . Drug use: No  . Sexual activity: Yes    Partners: Male   Other Topics Concern  . Not on file   Social History Narrative  . No narrative on file     Current Outpatient Prescriptions:  .  amitriptyline (ELAVIL) 25 MG tablet, Take 1 tablet (25 mg total) by mouth at bedtime.,  Disp: 90 tablet, Rfl: 1 .  aspirin EC 81 MG tablet, Take 1 tablet (81 mg total) by mouth daily., Disp: 30 tablet, Rfl: 0 .  Blood Glucose Monitoring Suppl (ACCU-CHEK AVIVA PLUS) w/Device KIT, , Disp: , Rfl:  .  diclofenac sodium (VOLTAREN) 1 % GEL, Apply topically as needed. Reported on 02/22/2016, Disp: , Rfl:  .  Glucose Blood (BLOOD GLUCOSE TEST STRIPS) STRP, , Disp: , Rfl:  .  Insulin Glargine (LANTUS SOLOSTAR) 100 UNIT/ML Solostar Pen, Inject 50 Units into the skin daily  at 10 pm., Disp: 15 mL, Rfl: 2 .  Insulin Pen Needle (RELION PEN NEEDLE 31G/8MM) 31G X 8 MM MISC, by Does not apply route 2 (two) times daily., Disp: , Rfl:  .  insulin regular (NOVOLIN R,HUMULIN R) 100 units/mL injection, Inject 0.1 mLs (10 Units total) into the skin 3 (three) times daily before meals., Disp: 10 mL, Rfl: 0 .  lamoTRIgine (LAMICTAL) 25 MG tablet, TAKE 2 TABLETS EVERY DAY, Disp: 180 tablet, Rfl: 1 .  levothyroxine (SYNTHROID, LEVOTHROID) 25 MCG tablet, TAKE 1 TABLET EVERY DAY  EXCEPT TAKE 2 TABLETS  ON  SUNDAYS, Disp: 100 tablet, Rfl: 0 .  losartan-hydrochlorothiazide (HYZAAR) 100-12.5 MG tablet, TAKE 1 TABLET EVERY DAY, Disp: 90 tablet, Rfl: 1 .  metFORMIN (GLUCOPHAGE-XR) 500 MG 24 hr tablet, Take 1 tablet (500 mg total) by mouth daily with breakfast., Disp: 180 tablet, Rfl: 1 .  metoprolol tartrate (LOPRESSOR) 25 MG tablet, Take 1 tablet (25 mg total) by mouth 2 (two) times daily., Disp: 180 tablet, Rfl: 1 .  omeprazole (PRILOSEC) 40 MG capsule, TAKE 1 CAPSULE EVERY DAY, Disp: 90 capsule, Rfl: 1 .  pravastatin (PRAVACHOL) 80 MG tablet, TAKE 1 TABLET EVERY DAY, Disp: 90 tablet, Rfl: 1 .  pregabalin (LYRICA) 300 MG capsule, Take 2 capsules (600 mg total) by mouth daily., Disp: 180 capsule, Rfl: 1 .  ranitidine (ZANTAC) 150 MG tablet, Take 150 mg by mouth at bedtime., Disp: , Rfl:  .  RELION INSULIN SYRINGE 1ML/31G 31G X 5/16" 1 ML MISC, , Disp: , Rfl:  .  sertraline (ZOLOFT) 100 MG tablet, TAKE 1 TABLET EVERY DAY, Disp: 90 tablet, Rfl: 1 .  vitamin B-12 (CYANOCOBALAMIN) 500 MCG tablet, Take 500 mcg by mouth daily., Disp: , Rfl:  .  vitamin C (ASCORBIC ACID) 500 MG tablet, Take 500 mg by mouth daily., Disp: , Rfl:  .  VITAMIN D, CHOLECALCIFEROL, PO, Take 20,000 Units by mouth daily., Disp: , Rfl:  .  zolpidem (AMBIEN) 10 MG tablet, TAKE 1 TABLET AT BEDTIME, Disp: 90 tablet, Rfl: 0  Allergies  Allergen Reactions  . Atorvastatin     muscle pain  . Codeine   . Latex Rash      ROS  Ten systems reviewed and is negative except as mentioned in HPI   Objective  Vitals:   04/20/16 0907  BP: 126/68  Pulse: 98  Resp: 18  Temp: 97.9 F (36.6 C)  TempSrc: Oral  SpO2: 96%  Weight: 225 lb 3.2 oz (102.2 kg)  Height: _0  (1.575 m)    Body mass index is 41.19 kg/m.  Physical Exam  Constitutional: Patient appears well-developed and well-nourished. Obese  No distress.  HEENT: head atraumatic, normocephalic, pupils equal and reactive to light, , neck supple, throat within normal limits Cardiovascular: Normal rate, regular rhythm and normal heart sounds.  No murmur heard. Trace BLE edema. Pulmonary/Chest: Effort normal and breath sounds normal. No respiratory distress. Abdominal: Soft.  There is no tenderness. Psychiatric: Patient has a normal mood and affect. behavior is normal. Judgment and thought content normal. Neurology: tardive dyskinesia movements Muscular Skeletal: ortho shoe on left foot, slow antalgic gait  Recent Results (from the past 2160 hour(s))  TSH     Status: None   Collection Time: 02/18/16  8:13 AM  Result Value Ref Range   TSH 3.540 0.450 - 4.500 uIU/mL  POCT glycosylated hemoglobin (Hb A1C)     Status: Abnormal   Collection Time: 04/20/16  9:39 AM  Result Value Ref Range   Hemoglobin A1C 9.3       PHQ2/9: Depression screen [facility] 2/9 02/22/2016 01/04/2016 07/20/2015 04/20/2015  Decreased Interest 0 1 0 3  Down, Depressed, Hopeless _0 PHQ - 2 Score _1 Altered sleeping 0 3 - 3  Tired, decreased energy 3 1 - 3  Change in appetite 0 1 - 3  Feeling bad or failure about yourself  1 0 - 3  Trouble concentrating 1 1 - 3  Moving slowly or fidgety/restless 0 0 - 3  Suicidal thoughts 0 0 - 3  PHQ-9 Score 6 9 - 27  Difficult doing work/chores - - - Very difficult     Fall Risk: Fall Risk  02/22/2016 01/04/2016 07/20/2015 04/20/2015  Falls in the past year? Yes Yes Yes No  Number falls in past yr: _2 -  Injury with  Fall? Yes No No -     Assessment & Plan  1. Diabetic polyneuropathy associated with type 2 diabetes mellitus ([name])  - POCT glycosylated hemoglobin (Hb A1C) not to goal, advised to post-pone surgery until hgb A1C is close to 7 to decrease risk of post-op complications  2. Charcot foot due to diabetes mellitus ([place])  - POCT glycosylated hemoglobin (Hb A1C)  3. Pre-op examination  - EKG 12-Lead - normal  Extensive evaluation by cardiologist and pulmonologist 2 years ago and symptoms are unchanged. May proceed to surgery once hgbA1C improves

## 2016-04-21 ENCOUNTER — Ambulatory Visit: Payer: Commercial Managed Care - HMO

## 2016-04-25 ENCOUNTER — Ambulatory Visit (INDEPENDENT_AMBULATORY_CARE_PROVIDER_SITE_OTHER): Payer: Commercial Managed Care - HMO | Admitting: Podiatry

## 2016-04-25 ENCOUNTER — Encounter: Payer: Self-pay | Admitting: Podiatry

## 2016-04-25 DIAGNOSIS — E1142 Type 2 diabetes mellitus with diabetic polyneuropathy: Secondary | ICD-10-CM

## 2016-04-25 DIAGNOSIS — E1140 Type 2 diabetes mellitus with diabetic neuropathy, unspecified: Secondary | ICD-10-CM

## 2016-04-25 DIAGNOSIS — E11610 Type 2 diabetes mellitus with diabetic neuropathic arthropathy: Secondary | ICD-10-CM

## 2016-04-25 DIAGNOSIS — M19072 Primary osteoarthritis, left ankle and foot: Secondary | ICD-10-CM | POA: Diagnosis not present

## 2016-04-25 DIAGNOSIS — Z872 Personal history of diseases of the skin and subcutaneous tissue: Secondary | ICD-10-CM | POA: Diagnosis not present

## 2016-04-25 NOTE — Progress Notes (Signed)
Patient presents to the office today to pick up diabetic shoes. 1 pair of shoes and 3 sets of inserts were dispensed today. Oral and written break-in instructions were discussed with her today. Continue daily foot inspection. She followed-up with Dr. Prudy Feeler in regards to surgery. Her sugar has been elevated recently and once under better control will likely undergo surgery.   [name], DPM

## 2016-05-05 ENCOUNTER — Ambulatory Visit: Payer: Self-pay | Admitting: Family Medicine

## 2016-05-16 ENCOUNTER — Other Ambulatory Visit: Payer: Self-pay | Admitting: Family Medicine

## 2016-05-16 NOTE — Telephone Encounter (Signed)
Patient requesting refill of Levothyroxine be sent to [facility].

## 2016-05-23 ENCOUNTER — Other Ambulatory Visit: Payer: Self-pay | Admitting: Family Medicine

## 2016-05-23 DIAGNOSIS — G4700 Insomnia, unspecified: Secondary | ICD-10-CM

## 2016-05-23 DIAGNOSIS — G44229 Chronic tension-type headache, not intractable: Secondary | ICD-10-CM

## 2016-05-23 NOTE — Telephone Encounter (Signed)
Patient requesting refill of Amitriptyline be sent to [facility].

## 2016-05-29 ENCOUNTER — Other Ambulatory Visit: Payer: Self-pay | Admitting: Family Medicine

## 2016-05-29 DIAGNOSIS — I10 Essential (primary) hypertension: Secondary | ICD-10-CM

## 2016-05-29 NOTE — Telephone Encounter (Signed)
Patient requesting refill of Ambien and Metoprolol to [facility].

## 2016-06-02 DIAGNOSIS — E1142 Type 2 diabetes mellitus with diabetic polyneuropathy: Secondary | ICD-10-CM | POA: Diagnosis not present

## 2016-06-02 DIAGNOSIS — E1165 Type 2 diabetes mellitus with hyperglycemia: Secondary | ICD-10-CM | POA: Diagnosis not present

## 2016-06-02 DIAGNOSIS — Z794 Long term (current) use of insulin: Secondary | ICD-10-CM | POA: Diagnosis not present

## 2016-06-02 DIAGNOSIS — E11610 Type 2 diabetes mellitus with diabetic neuropathic arthropathy: Secondary | ICD-10-CM | POA: Diagnosis not present

## 2016-06-02 DIAGNOSIS — E6609 Other obesity due to excess calories: Secondary | ICD-10-CM | POA: Diagnosis not present

## 2016-06-02 DIAGNOSIS — E781 Pure hyperglyceridemia: Secondary | ICD-10-CM | POA: Diagnosis not present

## 2016-06-15 ENCOUNTER — Telehealth: Payer: Self-pay | Admitting: Family Medicine

## 2016-06-27 ENCOUNTER — Encounter: Payer: Commercial Managed Care - HMO | Attending: Internal Medicine | Admitting: Dietician

## 2016-06-27 ENCOUNTER — Encounter: Payer: Self-pay | Admitting: Dietician

## 2016-06-27 VITALS — BP 120/78 | Ht 62.0 in | Wt 224.1 lb

## 2016-06-27 DIAGNOSIS — Z6841 Body Mass Index (BMI) 40.0 and over, adult: Secondary | ICD-10-CM | POA: Insufficient documentation

## 2016-06-27 DIAGNOSIS — Z713 Dietary counseling and surveillance: Secondary | ICD-10-CM | POA: Diagnosis not present

## 2016-06-27 DIAGNOSIS — E669 Obesity, unspecified: Secondary | ICD-10-CM | POA: Diagnosis not present

## 2016-06-27 DIAGNOSIS — E119 Type 2 diabetes mellitus without complications: Secondary | ICD-10-CM | POA: Insufficient documentation

## 2016-06-27 DIAGNOSIS — E1140 Type 2 diabetes mellitus with diabetic neuropathy, unspecified: Secondary | ICD-10-CM

## 2016-06-27 DIAGNOSIS — Z794 Long term (current) use of insulin: Secondary | ICD-10-CM

## 2016-06-27 NOTE — Progress Notes (Signed)
Sent email with [facility] counseling department to call patient to schedule an appointment as she requested.

## 2016-06-27 NOTE — Progress Notes (Signed)
Diabetes Self-Management Education  Visit Type: First/Initial  Appt. Start Time:1330 Appt. End Time: 1440  06/27/2016  [name], identified by name and date of birth, is a 65 y.o. female with a diagnosis of Diabetes: Type 2.   ASSESSMENT  Blood pressure 120/78, height 5\' 2"  (1.575 m), weight 224 lb 1.6 oz (101.7 kg). Body mass index is 40.99 kg/m.      Diabetes Self-Management Education - 06/27/16 1510      Visit Information   Visit Type First/Initial     Initial Visit   Diabetes Type Type 2     Pre-Education Assessment   Patient understands the diabetes disease and treatment process. Needs Review   Patient understands incorporating nutritional management into lifestyle. Needs Review   Patient undertands incorporating physical activity into lifestyle. Needs Review   Patient understands using medications safely. Needs Review   Patient understands prevention, detection, and treatment of acute complications. Needs Review   Patient understands prevention, detection, and treatment of chronic complications. Needs Review   Patient understands how to develop strategies to address psychosocial issues. Needs Review   Patient understands how to develop strategies to promote health/change behavior. Needs Review     Complications   Last HgB A1C per patient/outside source 9.3 %   How often do you check your blood sugar? 3-4 times/day   Fasting Blood glucose range (mg/dL) 130-179   Postprandial Blood glucose range (mg/dL) 130-179   Have you had a dilated eye exam in the past 12 months? No  last eye exam 03/2015   Have you had a dental exam in the past 12 months? Yes   Are you checking your feet? Yes   How many days per week are you checking your feet? 7     Dietary Intake   Breakfast 9:00am-2 eggs, ham or bacon   Lunch 1:00pm- vegetable plate, water or sugar free flavored water   Dinner 6:00pm- hamburger steak, salad    Snack (evening) no evening snack   Beverage(s) 20 oz.  water daily; 3-4 cups of sugar free flavored water     Exercise   Exercise Type ADL's  difficult to do normal activities due to charcot foot.   How many days per week to you exercise? 0     Patient Education   Nutrition management  Role of diet in the treatment of diabetes and the relationship between the three main macronutrients and blood glucose level;Food label reading, portion sizes and measuring food.;Carbohydrate counting;Reviewed blood glucose goals for pre and post meals and how to evaluate the patients' food intake on their blood glucose level.;Meal timing in regards to the patients' current diabetes medication.  discussed arm chair exercises   Physical activity and exercise  Role of exercise on diabetes management, blood pressure control and cardiac health.;Helped patient identify appropriate exercises in relation to his/her diabetes, diabetes complications and other health issue.   Psychosocial adjustment --  Patient agreed to meet with [facility] counselor     Individualized Goals (developed by patient)   Nutrition Follow meal plan discussed;General guidelines for healthy choices and portions discussed   Physical Activity Exercise 3-5 times per week  Armchair exercises   Monitoring  test my blood glucose as discussed;test blood glucose pre and post meals as discussed      Individualized Plan for Diabetes Self-Management Training:   Learning Objective:  Patient will have a greater understanding of diabetes self-management. Patient education plan is to attend individual and/or group sessions per assessed needs  and concerns.   Plan:   Patient Instructions  Balance meals with protein (1-3 oz), at least 1 starch serving with limit of 2 starch servings and non-starchy vegetables. Can add a small serving of fruit or yogurt with meals. Spread 9 servings of carbohydrate over 3 meals and 1-2 snacks. Include at least 30 gms of carbohydrate at meals.  Space 3 meals per day 4-5 hours  apart.  Include an evening snack 2 hours after dinner that includes a protein and a serving of carbohydrate.  Continue checking blood sugars before meals and at bedtime.     Education material provided: Planning a [employer]; Food guide plate If problems or questions, patient to contact team via:  Summer [name], CDE  Future DSME appointment:  07/11/16 at 10:30am

## 2016-06-27 NOTE — Patient Instructions (Signed)
Balance meals with protein (1-3 oz), at least 1 starch serving with limit of 2 starch servings and non-starchy vegetables. Can add a small serving of fruit or yogurt with meals. Spread 9 servings of carbohydrate over 3 meals and 1-2 snacks. Include at least 30 gms of carbohydrate at meals.  Space 3 meals per day 4-5 hours apart.  Include an evening snack 2 hours after dinner that includes a protein and a serving of carbohydrate.  Continue checking blood sugars before meals and at bedtime.

## 2016-07-11 ENCOUNTER — Ambulatory Visit: Payer: Commercial Managed Care - HMO | Admitting: Dietician

## 2016-07-14 ENCOUNTER — Encounter: Payer: Self-pay | Admitting: Dietician

## 2016-07-19 ENCOUNTER — Other Ambulatory Visit: Payer: Self-pay | Admitting: Family Medicine

## 2016-07-20 NOTE — Telephone Encounter (Signed)
Patient requesting refill of Levothyroxine to [facility].  

## 2016-07-21 DIAGNOSIS — E119 Type 2 diabetes mellitus without complications: Secondary | ICD-10-CM | POA: Diagnosis not present

## 2016-07-21 LAB — HM DIABETES EYE EXAM

## 2016-07-25 ENCOUNTER — Other Ambulatory Visit: Payer: Self-pay | Admitting: Family Medicine

## 2016-07-25 DIAGNOSIS — E1165 Type 2 diabetes mellitus with hyperglycemia: Secondary | ICD-10-CM | POA: Diagnosis not present

## 2016-07-25 DIAGNOSIS — Z794 Long term (current) use of insulin: Secondary | ICD-10-CM | POA: Diagnosis not present

## 2016-07-27 ENCOUNTER — Ambulatory Visit: Payer: Commercial Managed Care - HMO | Admitting: Podiatry

## 2016-08-10 ENCOUNTER — Ambulatory Visit (INDEPENDENT_AMBULATORY_CARE_PROVIDER_SITE_OTHER): Payer: Commercial Managed Care - HMO | Admitting: Podiatry

## 2016-08-10 DIAGNOSIS — E119 Type 2 diabetes mellitus without complications: Secondary | ICD-10-CM | POA: Diagnosis not present

## 2016-08-10 DIAGNOSIS — Z872 Personal history of diseases of the skin and subcutaneous tissue: Secondary | ICD-10-CM

## 2016-08-10 DIAGNOSIS — Z0189 Encounter for other specified special examinations: Secondary | ICD-10-CM

## 2016-08-10 DIAGNOSIS — E11610 Type 2 diabetes mellitus with diabetic neuropathic arthropathy: Secondary | ICD-10-CM | POA: Diagnosis not present

## 2016-08-10 NOTE — Progress Notes (Signed)
Patient ID: Summer [name], female   DOB: [birth year], 65 y.o.   MRN: [id number]  Subjective: Summer [name] is a 65 y.o. is seen today for diabetic foot exam. She states that she has her blood sugars under better control and she is trying to get in contact with Dr. Lovena Le office in regards to left foot surgery. She states her left foot is starting to become more painful over the last couple months and she has noticed that her foot is turning outwards more and she can no longer work because of the pain and deformity to her foot. Denies any open sores or any swelling or redness to her feet. Denies any fevers, chills, nausea, vomiting. No calf pain, chest pain, shortness of breath. Denies any recent injury or trauma.   Objective: General: No acute distress, AAOx3  DP/PT pulses palpable 2/4, CRT < 3 sec to all digits.  Protective sensation intact. Motor function intact.  There significant abduction of the left foot as well as flattening the arch. The foot appears to be almost a 45 angle to the leg on the left side. There is prominence along the talus medially. There is no open lesions identified. The scar the plantar medial aspect of the midfoot from prior ulceration how there is no open sores identified this time. There is no overlying edema, erythema, increase in warmth. There is no pain of the right foot. No other open lesions or pre-ulcerative lesions.  No pain with calf compression, swelling, warmth, erythema.   Assessment and Plan:  Left foot Charcot, symptomatic pes planovalgus  -Treatment options discussed including all alternatives, risks, and complications -At this time she has no open sores. Continue diabetic shoes which appear to be fitting well. Chest wears an ankle brace which helps some. She's tried given contact Dr. Lovena Le office in regards to surgery. -Discussed daily foot inspection. -Follow up in 3 months or as needed for evaluation. In the meantime call any questions or concerns or  any change in symptoms.  [name], DPM

## 2016-08-14 ENCOUNTER — Encounter: Payer: Self-pay | Admitting: Dietician

## 2016-08-18 ENCOUNTER — Ambulatory Visit (INDEPENDENT_AMBULATORY_CARE_PROVIDER_SITE_OTHER): Payer: Commercial Managed Care - HMO | Admitting: Family Medicine

## 2016-08-18 ENCOUNTER — Encounter: Payer: Self-pay | Admitting: Family Medicine

## 2016-08-18 VITALS — BP 122/64 | HR 83 | Temp 97.7°F | Resp 16 | Ht 62.0 in | Wt 226.2 lb

## 2016-08-18 DIAGNOSIS — G4709 Other insomnia: Secondary | ICD-10-CM

## 2016-08-18 DIAGNOSIS — G44229 Chronic tension-type headache, not intractable: Secondary | ICD-10-CM | POA: Diagnosis not present

## 2016-08-18 DIAGNOSIS — F339 Major depressive disorder, recurrent, unspecified: Secondary | ICD-10-CM

## 2016-08-18 DIAGNOSIS — Z794 Long term (current) use of insulin: Secondary | ICD-10-CM

## 2016-08-18 DIAGNOSIS — N183 Chronic kidney disease, stage 3 unspecified: Secondary | ICD-10-CM

## 2016-08-18 DIAGNOSIS — G2401 Drug induced subacute dyskinesia: Secondary | ICD-10-CM

## 2016-08-18 DIAGNOSIS — L308 Other specified dermatitis: Secondary | ICD-10-CM | POA: Diagnosis not present

## 2016-08-18 DIAGNOSIS — N644 Mastodynia: Secondary | ICD-10-CM

## 2016-08-18 DIAGNOSIS — E1122 Type 2 diabetes mellitus with diabetic chronic kidney disease: Secondary | ICD-10-CM

## 2016-08-18 DIAGNOSIS — K219 Gastro-esophageal reflux disease without esophagitis: Secondary | ICD-10-CM

## 2016-08-18 DIAGNOSIS — I10 Essential (primary) hypertension: Secondary | ICD-10-CM

## 2016-08-18 DIAGNOSIS — E1165 Type 2 diabetes mellitus with hyperglycemia: Secondary | ICD-10-CM | POA: Insufficient documentation

## 2016-08-18 DIAGNOSIS — E11610 Type 2 diabetes mellitus with diabetic neuropathic arthropathy: Secondary | ICD-10-CM

## 2016-08-18 DIAGNOSIS — M19072 Primary osteoarthritis, left ankle and foot: Secondary | ICD-10-CM

## 2016-08-18 DIAGNOSIS — E038 Other specified hypothyroidism: Secondary | ICD-10-CM | POA: Diagnosis not present

## 2016-08-18 DIAGNOSIS — E785 Hyperlipidemia, unspecified: Secondary | ICD-10-CM

## 2016-08-18 DIAGNOSIS — E1142 Type 2 diabetes mellitus with diabetic polyneuropathy: Secondary | ICD-10-CM | POA: Diagnosis not present

## 2016-08-18 DIAGNOSIS — N182 Chronic kidney disease, stage 2 (mild): Secondary | ICD-10-CM

## 2016-08-18 DIAGNOSIS — E1169 Type 2 diabetes mellitus with other specified complication: Secondary | ICD-10-CM | POA: Diagnosis not present

## 2016-08-18 LAB — CBC WITH DIFFERENTIAL/PLATELET
Basophils Absolute: 73 cells/uL (ref 0–200)
Basophils Relative: 1 %
Eosinophils Absolute: 438 cells/uL (ref 15–500)
Eosinophils Relative: 6 %
HCT: 36.7 % (ref 35.0–45.0)
Hemoglobin: 12.1 g/dL (ref 11.7–15.5)
Lymphocytes Relative: 18 %
Lymphs Abs: 1314 cells/uL (ref 850–3900)
MCH: 28 pg (ref 27.0–33.0)
MCHC: 33 g/dL (ref 32.0–36.0)
MCV: 85 fL (ref 80.0–100.0)
MPV: 10.2 fL (ref 7.5–12.5)
Monocytes Absolute: 876 cells/uL (ref 200–950)
Monocytes Relative: 12 %
Neutro Abs: 4599 cells/uL (ref 1500–7800)
Neutrophils Relative %: 63 %
Platelets: 259 10*3/uL (ref 140–400)
RBC: 4.32 MIL/uL (ref 3.80–5.10)
RDW: 15.7 % — ABNORMAL HIGH (ref 11.0–15.0)
WBC: 7.3 10*3/uL (ref 3.8–10.8)

## 2016-08-18 LAB — TSH: TSH: 1.74 mIU/L

## 2016-08-18 MED ORDER — AMITRIPTYLINE HCL 50 MG PO TABS: 25.0000 mg | ORAL_TABLET | Freq: Every day | ORAL | 1 refills | Status: DC

## 2016-08-18 MED ORDER — OMEPRAZOLE 40 MG PO CPDR: 40.0000 mg | DELAYED_RELEASE_CAPSULE | Freq: Every day | ORAL | 1 refills | Status: DC

## 2016-08-18 MED ORDER — TRIAMCINOLONE ACETONIDE 0.5 % EX CREA: 1.0000 "application " | TOPICAL_CREAM | Freq: Three times a day (TID) | CUTANEOUS | 0 refills | Status: DC

## 2016-08-18 NOTE — Progress Notes (Addendum)
Name: Summer [name]   MRN: [id number]    DOB: 06/17/1951   Date:08/18/2016       Progress Note  Subjective  Chief Complaint  Chief Complaint  Patient presents with  . Medication Refill    4 month F/U  . Diabetes    Patient goes to Dr. Maretta Bees at [facility], they have gotten her A1C down to 7.3%  . Hypothyroidism    Spots of Dry Skin  . Hypertension    Headaches  . Hyperlipidemia  . Insomnia    Patient states the medication makes her sleep walk and has cut back to half a pill, sleeps on average 6 hours   . Gastroesophageal Reflux    Takes medication every day and controlling symptoms    HPI   DMII with Charcot foot , dyslipidemia and CKI: seeing Dr. Graceann Congress, had diabetic teaching class, glucose is better controlled, eating better and counting carbs.   Chronic Depression: she is taking medications and is doing better, Diabetes is under better control, she also saw a therapist, she has a supportive boyfriend ( living together for the past 23 years ) and also friends , taking medication and denies side effects. She denies crying spells, but occasionally she has suicidal thoughts, she states she would walk in front of a truck. She has talked to boyfriend about it, she said she would not do it because of Darrell ( boyfriend ). Explained that she needs to have to contact suicide hotline and we will refer her to Psychiatrist.   Decrease in exercise tolerance: she has seen cardiologist and pulmonologist. Had spirometry and stress test, and likely secondary to inactivity and also obesity. She continues to have difficulty breathing.   Dyslipidemia: triglycerides has been elevated, she has a better diet, we will repeat levels and if still high we will start her on fish oil prescription.   Hypothyroidism: she is taking medication daily, no recent constipation, denies alopecia  GERD: taking omeprazole and reflux is under control, denies heartburn or regurgitation  HTN: taking medication  and denies side effects, no chest pain or palpitation  Tardive dyskinesia: doing much better  Eczema: she has dry spots all over her body over the past couple of months, lotions does not seem to help. She has noticed some pruritus and she scratches until it bleeds  Insomnia: she is sleep walking and eating with Ambien, we will stop medication and try higher dose of Elavil.   Chronic headaches: she has three to four episodes a week and she takes Excedrin Migraine. Discussed making sure she is staying hydrated, make sure that she has not skipped a meal, and try taking a nap before she takes medication  OSA: not compliant with CPAP , unable to tolerate   Patient Active Problem List   Diagnosis Date Noted  . Charcot foot due to diabetes mellitus ([place]) 02/22/2016  . Hypertriglyceridemia 07/27/2015  . Acquired abduction deformity of foot 07/12/2015  . Osteoarthritis of subtalar joint 07/12/2015  . Polyneuropathy in diseases classified elsewhere ([place]) 07/12/2015  . Arthritis of foot, degenerative 07/12/2015  . Carpal tunnel syndrome 04/17/2015  . Chronic constipation 04/17/2015  . Insomnia, persistent 04/17/2015  . Chronic kidney disease (CKD), stage III (moderate) 04/17/2015  . Decreased exercise tolerance 04/17/2015  . Diabetes mellitus with polyneuropathy ([place]) 04/17/2015  . Gastro-esophageal reflux disease without esophagitis 04/17/2015  . Bursitis, trochanteric 04/17/2015  . Cephalalgia 04/17/2015  . Benign hypertension 04/17/2015  . Adult hypothyroidism 04/17/2015  . Hearing loss  04/17/2015  . Chronic recurrent major depressive disorder ([place]) 04/17/2015  . Neurogenic claudication 04/17/2015  . Obesity (BMI 35.0-39.9 without comorbidity) 04/17/2015  . Hypo-ovarianism 04/17/2015  . Perennial allergic rhinitis with seasonal variation 04/17/2015  . Acne erythematosa 04/17/2015  . Dyskinesia, tardive 04/17/2015  . Memory loss 04/17/2015  . Impingement syndrome of shoulder  04/17/2015  . Dermatitis, stasis 04/17/2015  . Obstructive sleep apnea 05/14/2014  . Dyspnea 05/06/2014  . Hyperlipidemia 02/06/2012  . LBP (low back pain) 09/16/2008    Past Surgical History:  Procedure Laterality Date  . CATARACT EXTRACTION  01/2011   right  . eye lid surgery  2013   bilateral  . NECK SURGERY    . TUBAL LIGATION    . VAGINAL HYSTERECTOMY  1989    Family History  Problem Relation Age of Onset  . Heart attack Mother   . Aneurysm Mother     Social History   Social History  . Marital status: Widowed    Spouse name: N/A  . Number of children: N/A  . Years of education: N/A   Occupational History  . Not on file.   Social History Main Topics  . Smoking status: Never Smoker  . Smokeless tobacco: Never Used  . Alcohol use 0.0 - 0.6 oz/week  . Drug use: No  . Sexual activity: Yes    Partners: Male   Other Topics Concern  . Not on file   Social History Narrative  . No narrative on file     Current Outpatient Prescriptions:  .  amitriptyline (ELAVIL) 25 MG tablet, TAKE 1 TABLET (25 MG TOTAL) BY MOUTH AT BEDTIME., Disp: 90 tablet, Rfl: 1 .  ASPIRIN LOW DOSE 81 MG EC tablet, TAKE 1 TABLET (81 MG TOTAL) BY MOUTH DAILY., Disp: 30 tablet, Rfl: 0 .  Blood Glucose Monitoring Suppl (ACCU-CHEK AVIVA PLUS) w/Device KIT, , Disp: , Rfl:  .  diclofenac sodium (VOLTAREN) 1 % GEL, Apply topically as needed. Reported on 02/22/2016, Disp: , Rfl:  .  Glucose Blood (BLOOD GLUCOSE TEST STRIPS) STRP, , Disp: , Rfl:  .  Insulin Glargine (LANTUS SOLOSTAR) 100 UNIT/ML Solostar Pen, Inject 50 Units into the skin daily at 10 pm. (Patient taking differently: Inject 60 Units into the skin daily at 10 pm. ), Disp: 15 mL, Rfl: 2 .  Insulin Pen Needle (RELION PEN NEEDLE 31G/8MM) 31G X 8 MM MISC, by Does not apply route 2 (two) times daily., Disp: , Rfl:  .  insulin regular (NOVOLIN R,HUMULIN R) 100 units/mL injection, Inject 0.1 mLs (10 Units total) into the skin 3 (three) times  daily before meals. (Patient taking differently: Inject 28 Units into the skin 3 (three) times daily before meals. ), Disp: 10 mL, Rfl: 0 .  lamoTRIgine (LAMICTAL) 25 MG tablet, TAKE 2 TABLETS EVERY DAY, Disp: 180 tablet, Rfl: 1 .  levothyroxine (SYNTHROID, LEVOTHROID) 25 MCG tablet, TAKE 1 TABLET EVERY DAY  EXCEPT TAKE 2 TABLETS  ON  SUNDAYS, Disp: 100 tablet, Rfl: 0 .  losartan-hydrochlorothiazide (HYZAAR) 100-12.5 MG tablet, TAKE 1 TABLET EVERY DAY, Disp: 90 tablet, Rfl: 1 .  metFORMIN (GLUCOPHAGE-XR) 500 MG 24 hr tablet, Take 1 tablet (500 mg total) by mouth daily with breakfast., Disp: 180 tablet, Rfl: 1 .  metoprolol tartrate (LOPRESSOR) 25 MG tablet, TAKE 1 TABLET TWICE DAILY, Disp: 180 tablet, Rfl: 1 .  omeprazole (PRILOSEC) 40 MG capsule, TAKE 1 CAPSULE EVERY DAY, Disp: 90 capsule, Rfl: 1 .  pravastatin (PRAVACHOL) 80 MG tablet,  TAKE 1 TABLET EVERY DAY, Disp: 90 tablet, Rfl: 1 .  pregabalin (LYRICA) 300 MG capsule, Take 2 capsules (600 mg total) by mouth daily., Disp: 180 capsule, Rfl: 1 .  ranitidine (ZANTAC) 150 MG tablet, Take 150 mg by mouth at bedtime., Disp: , Rfl:  .  RELION INSULIN SYRINGE 1ML/31G 31G X 5/16" 1 ML MISC, , Disp: , Rfl:  .  sertraline (ZOLOFT) 100 MG tablet, TAKE 1 TABLET EVERY DAY, Disp: 90 tablet, Rfl: 1 .  VITAMIN D, CHOLECALCIFEROL, PO, Take 20,000 Units by mouth daily., Disp: , Rfl:   Allergies  Allergen Reactions  . Atorvastatin     muscle pain  . Codeine   . Latex Rash     ROS  Constitutional: Negative for fever or weight change.  Respiratory: Negative for cough, positive for  shortness of breath.   Cardiovascular: Negative for chest pain or palpitations.  Gastrointestinal: Negative for abdominal pain, no bowel changes ( had flu like symptoms but diarrhea has resolved now).  Musculoskeletal: Positive for gait problem and left  joint swelling.  Skin: Positive  for rash.  Neurological: Negative for dizziness, positive for intermittent  headache.   No other specific complaints in a complete review of systems (except as listed in HPI above).  Objective  Vitals:   08/18/16 0959  BP: 122/64  Pulse: 83  Resp: 16  Temp: 97.7 F (36.5 C)  TempSrc: Oral  SpO2: 95%  Weight: 226 lb 3.2 oz (102.6 kg)  Height: _0  (1.575 m)    Body mass index is 41.37 kg/m.  Physical Exam  Constitutional: Patient appears well-developed and well-nourished. Obese No distress.  HEENT: head atraumatic, normocephalic, pupils equal and reactive to light, neck supple, throat within normal limits Cardiovascular: Normal rate, regular rhythm and normal heart sounds.  No murmur heard. No BLE edema. Pulmonary/Chest: Effort normal and breath sounds normal. No respiratory distress. Abdominal: Soft.  There is no tenderness. Psychiatric: Patient has a normal mood and affect. behavior is normal. Judgment and thought content normal. Skin: dry eczematous patches back and arms   PHQ2/9: Depression screen [facility] 2/9 08/18/2016 06/27/2016 02/22/2016 01/04/2016 07/20/2015  Decreased Interest 3 3 0 1 0  Down, Depressed, Hopeless _1 PHQ - 2 Score _2 Altered sleeping 3 1 0 3 -  Tired, decreased energy _3 -  Change in appetite 3 3 0 1 -  Feeling bad or failure about yourself  _4 0 -  Trouble concentrating _5 -  Moving slowly or fidgety/restless 0 2 0 0 -  Suicidal thoughts 0 1 0 0 -  PHQ-9 Score _6 -  Difficult doing work/chores Very difficult Not difficult at all - - -     Fall Risk: Fall Risk  08/18/2016 06/27/2016 02/22/2016 01/04/2016 07/20/2015  Falls in the past year? _7   Number falls in past yr: 1 2 or more _8 Injury with Fall? No No Yes No No  Risk Factor Category  - High Fall Risk - - -  Risk for fall due to : - Impaired mobility - - -  Follow up - Education provided;Falls evaluation completed - - -     Functional Status Survey: Is the patient deaf or have difficulty hearing?: No Does the patient  have difficulty seeing, even when wearing glasses/contacts?: No Does the patient have difficulty concentrating, remembering, or  making decisions?: Yes Does the patient have difficulty walking or climbing stairs?: Yes Does the patient have difficulty dressing or bathing?: No Does the patient have difficulty doing errands alone such as visiting a doctor's office or shopping?: No    Assessment & Plan  1. Diabetic polyneuropathy associated with type 2 diabetes mellitus ([place])  Continue follow up with Dr. Graceann Congress   2. Chronic recurrent major depressive disorder ([place])  - Ambulatory referral to Psychiatry  3. Charcot foot due to diabetes mellitus ([name])  Sees Dr. Earleen Newport   4. Primary osteoarthritis of left foot   5. Other specified hypothyroidism  -TSH  6. Dyskinesia, tardive  Doing better - secondary to Abilify   7. Benign hypertension  - COMPLETE METABOLIC PANEL WITH GFR  8. Chronic kidney disease (CKD), stage III (moderate)  - COMPLETE METABOLIC PANEL WITH GFR - VITAMIN D 25 Hydroxy (Vit-D Deficiency, Fractures) - CBC with Differential/Platelet  9. Chronic tension-type headache, not intractable  - amitriptyline (ELAVIL) 50 MG tablet; Take 0.5-1 tablets (25-50 mg total) by mouth at bedtime.  Dispense: 90 tablet; Refill: 1  10. Other insomnia  - amitriptyline (ELAVIL) 50 MG tablet; Take 0.5-1 tablets (25-50 mg total) by mouth at bedtime.  Dispense: 90 tablet; Refill: 1  11. Other eczema  - triamcinolone cream (KENALOG) 0.5 %; Apply 1 application topically 3 (three) times daily.  Dispense: 454 g; Refill: 0  12. Dyslipidemia associated with type 2 diabetes mellitus ([name])  - Lipid panel  13. Gastroesophageal reflux disease without esophagitis  - omeprazole (PRILOSEC) 40 MG capsule; Take 1 capsule (40 mg total) by mouth daily.  Dispense: 90 capsule; Refill: 1  14. Nipple pain  Reassurance, symptoms are mild and intermittent, no nipple discharge, discussed  mammogram but she wants to hold off for now  15. Type 2 diabetes mellitus with stage 2 chronic kidney disease, with long-term current use of insulin ([name])  Continue ARB

## 2016-08-19 LAB — LIPID PANEL
Cholesterol: 208 mg/dL — ABNORMAL HIGH (ref <200)
HDL: 38 mg/dL — ABNORMAL LOW (ref >50)
LDL Cholesterol: 122 mg/dL — ABNORMAL HIGH (ref <100)
Total CHOL/HDL Ratio: 5.5 Ratio — ABNORMAL HIGH (ref <5.0)
Triglycerides: 241 mg/dL — ABNORMAL HIGH (ref <150)
VLDL: 48 mg/dL — ABNORMAL HIGH (ref <30)

## 2016-08-19 LAB — COMPLETE METABOLIC PANEL WITH GFR
ALT: 12 U/L (ref 6–29)
AST: 14 U/L (ref 10–35)
Albumin: 4.5 g/dL (ref 3.6–5.1)
Alkaline Phosphatase: 78 U/L (ref 33–130)
BUN: 35 mg/dL — ABNORMAL HIGH (ref 7–25)
CO2: 21 mmol/L (ref 20–31)
Calcium: 9.7 mg/dL (ref 8.6–10.4)
Chloride: 108 mmol/L (ref 98–110)
Creat: 1.15 mg/dL — ABNORMAL HIGH (ref 0.50–0.99)
GFR, Est African American: 57 mL/min — ABNORMAL LOW (ref >=60)
GFR, Est Non African American: 49 mL/min — ABNORMAL LOW (ref >=60)
Glucose, Bld: 89 mg/dL (ref 65–99)
Potassium: 4.4 mmol/L (ref 3.5–5.3)
Sodium: 141 mmol/L (ref 135–146)
Total Bilirubin: 0.3 mg/dL (ref 0.2–1.2)
Total Protein: 6.8 g/dL (ref 6.1–8.1)

## 2016-08-19 LAB — VITAMIN D 25 HYDROXY (VIT D DEFICIENCY, FRACTURES): Vit D, 25-Hydroxy: 36 ng/mL (ref 30–100)

## 2016-08-21 ENCOUNTER — Other Ambulatory Visit: Payer: Self-pay | Admitting: Family Medicine

## 2016-08-21 ENCOUNTER — Encounter (HOSPITAL_COMMUNITY): Payer: Self-pay

## 2016-08-21 MED ORDER — ROSUVASTATIN CALCIUM 20 MG PO TABS: 20.0000 mg | ORAL_TABLET | Freq: Every day | ORAL | 1 refills | Status: DC

## 2016-08-23 DIAGNOSIS — M2042 Other hammer toe(s) (acquired), left foot: Secondary | ICD-10-CM | POA: Diagnosis not present

## 2016-08-23 DIAGNOSIS — M21072 Valgus deformity, not elsewhere classified, left ankle: Secondary | ICD-10-CM | POA: Diagnosis not present

## 2016-08-23 DIAGNOSIS — M19072 Primary osteoarthritis, left ankle and foot: Secondary | ICD-10-CM | POA: Diagnosis not present

## 2016-08-24 DIAGNOSIS — Z79899 Other long term (current) drug therapy: Secondary | ICD-10-CM | POA: Diagnosis not present

## 2016-08-24 DIAGNOSIS — E781 Pure hyperglyceridemia: Secondary | ICD-10-CM | POA: Diagnosis not present

## 2016-08-24 DIAGNOSIS — E1165 Type 2 diabetes mellitus with hyperglycemia: Secondary | ICD-10-CM | POA: Diagnosis not present

## 2016-08-24 DIAGNOSIS — Z794 Long term (current) use of insulin: Secondary | ICD-10-CM | POA: Diagnosis not present

## 2016-09-06 ENCOUNTER — Ambulatory Visit (INDEPENDENT_AMBULATORY_CARE_PROVIDER_SITE_OTHER): Payer: Commercial Managed Care - HMO | Admitting: Licensed Clinical Social Worker

## 2016-09-06 DIAGNOSIS — F331 Major depressive disorder, recurrent, moderate: Secondary | ICD-10-CM | POA: Diagnosis not present
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
## 2016-09-07 ENCOUNTER — Ambulatory Visit: Payer: Commercial Managed Care - HMO | Admitting: Podiatry

## 2016-09-13 DIAGNOSIS — M21072 Valgus deformity, not elsewhere classified, left ankle: Secondary | ICD-10-CM | POA: Diagnosis not present

## 2016-09-13 DIAGNOSIS — M24572 Contracture, left ankle: Secondary | ICD-10-CM | POA: Diagnosis not present

## 2016-09-19 ENCOUNTER — Encounter: Payer: Self-pay | Admitting: Family Medicine

## 2016-09-19 ENCOUNTER — Ambulatory Visit (INDEPENDENT_AMBULATORY_CARE_PROVIDER_SITE_OTHER): Payer: Commercial Managed Care - HMO | Admitting: Family Medicine

## 2016-09-19 VITALS — BP 132/68 | HR 88 | Temp 99.0°F | Resp 16 | Ht 62.0 in | Wt 226.1 lb

## 2016-09-19 DIAGNOSIS — E1142 Type 2 diabetes mellitus with diabetic polyneuropathy: Secondary | ICD-10-CM | POA: Diagnosis not present

## 2016-09-19 DIAGNOSIS — Z01818 Encounter for other preprocedural examination: Secondary | ICD-10-CM | POA: Diagnosis not present

## 2016-09-19 DIAGNOSIS — E11610 Type 2 diabetes mellitus with diabetic neuropathic arthropathy: Secondary | ICD-10-CM | POA: Diagnosis not present

## 2016-09-19 DIAGNOSIS — I10 Essential (primary) hypertension: Secondary | ICD-10-CM

## 2016-09-19 NOTE — Progress Notes (Signed)
Name: Summer [name]   MRN: [id number]    DOB: [birth year]   Date:09/19/2016       Progress Note  Subjective  Chief Complaint  Chief Complaint  Patient presents with  . paperwork for surgical clearence lft foot pantalar fusion    HPI  Pre-op : she came in for pre-op requested by Dr. Dorthula Rue, from [place] to have a pre-op clearance for left foot pantalar fusion. She has been doing well, glucose is well controlled at home and hgbA1C is down to 7.3%, she denies chest pain, and SOB is chronic and stable ( previously evaluated by cardiologist ), she had foot surgery last year without any complication in the post-op period. Last EKG normal 04/2016. Had stress test in 2013 and echo done by Dr. Cathie Olden in 2015. She is inactive because of foot problems and is unable to exercise, she has OSA and advised to use CPAP on the post-op period.    Patient Active Problem List   Diagnosis Date Noted  . Dyslipidemia associated with type 2 diabetes mellitus ([place]) 08/18/2016  . Charcot foot due to diabetes mellitus ([place]) 02/22/2016  . Hypertriglyceridemia 07/27/2015  . Acquired abduction deformity of foot 07/12/2015  . Osteoarthritis of subtalar joint 07/12/2015  . Polyneuropathy in diseases classified elsewhere ([place]) 07/12/2015  . Arthritis of foot, degenerative 07/12/2015  . Carpal tunnel syndrome 04/17/2015  . Chronic constipation 04/17/2015  . Insomnia, persistent 04/17/2015  . Chronic kidney disease (CKD), stage III (moderate) 04/17/2015  . Decreased exercise tolerance 04/17/2015  . Diabetes mellitus with polyneuropathy ([place]) 04/17/2015  . Gastro-esophageal reflux disease without esophagitis 04/17/2015  . Bursitis, trochanteric 04/17/2015  . Cephalalgia 04/17/2015  . Benign hypertension 04/17/2015  . Adult hypothyroidism 04/17/2015  . Hearing loss 04/17/2015  . Chronic recurrent major depressive disorder ([place]) 04/17/2015  . Neurogenic claudication 04/17/2015  . Obesity (BMI 35.0-39.9 without  comorbidity) 04/17/2015  . Hypo-ovarianism 04/17/2015  . Perennial allergic rhinitis with seasonal variation 04/17/2015  . Acne erythematosa 04/17/2015  . Dyskinesia, tardive 04/17/2015  . Memory loss 04/17/2015  . Impingement syndrome of shoulder 04/17/2015  . Dermatitis, stasis 04/17/2015  . Obstructive sleep apnea 05/14/2014  . Dyspnea 05/06/2014  . Hyperlipidemia 02/06/2012  . LBP (low back pain) 09/16/2008    Past Surgical History:  Procedure Laterality Date  . CATARACT EXTRACTION  01/2011   right  . eye lid surgery  2013   bilateral  . NECK SURGERY    . TUBAL LIGATION    . VAGINAL HYSTERECTOMY  1989    Family History  Problem Relation Age of Onset  . Heart attack Mother   . Aneurysm Mother     Social History   Social History  . Marital status: Widowed    Spouse name: N/A  . Number of children: N/A  . Years of education: N/A   Occupational History  . Not on file.   Social History Main Topics  . Smoking status: Never Smoker  . Smokeless tobacco: Never Used  . Alcohol use 0.0 - 0.6 oz/week  . Drug use: No  . Sexual activity: Yes    Partners: Male   Other Topics Concern  . Not on file   Social History Narrative  . No narrative on file     Current Outpatient Prescriptions:  .  amitriptyline (ELAVIL) 50 MG tablet, Take 0.5-1 tablets (25-50 mg total) by mouth at bedtime., Disp: 90 tablet, Rfl: 1 .  ASPIRIN LOW DOSE 81 MG EC tablet, TAKE 1 TABLET (81  MG TOTAL) BY MOUTH DAILY., Disp: 30 tablet, Rfl: 0 .  Blood Glucose Monitoring Suppl (ACCU-CHEK AVIVA PLUS) w/Device KIT, , Disp: , Rfl:  .  diclofenac sodium (VOLTAREN) 1 % GEL, Apply topically as needed. Reported on 02/22/2016, Disp: , Rfl:  .  Glucose Blood (BLOOD GLUCOSE TEST STRIPS) STRP, , Disp: , Rfl:  .  Insulin Glargine (LANTUS SOLOSTAR) 100 UNIT/ML Solostar Pen, Inject 50 Units into the skin daily at 10 pm. (Patient taking differently: Inject 60 Units into the skin daily at 10 pm. ), Disp: 15 mL, Rfl:  2 .  Insulin Pen Needle (RELION PEN NEEDLE 31G/8MM) 31G X 8 MM MISC, by Does not apply route 2 (two) times daily., Disp: , Rfl:  .  insulin regular (NOVOLIN R,HUMULIN R) 100 units/mL injection, Inject 0.1 mLs (10 Units total) into the skin 3 (three) times daily before meals. (Patient taking differently: Inject 28 Units into the skin 3 (three) times daily before meals. ), Disp: 10 mL, Rfl: 0 .  lamoTRIgine (LAMICTAL) 25 MG tablet, TAKE 2 TABLETS EVERY DAY, Disp: 180 tablet, Rfl: 1 .  levothyroxine (SYNTHROID, LEVOTHROID) 25 MCG tablet, TAKE 1 TABLET EVERY DAY  EXCEPT TAKE 2 TABLETS  ON  SUNDAYS, Disp: 100 tablet, Rfl: 0 .  losartan-hydrochlorothiazide (HYZAAR) 100-12.5 MG tablet, TAKE 1 TABLET EVERY DAY, Disp: 90 tablet, Rfl: 1 .  metFORMIN (GLUCOPHAGE-XR) 500 MG 24 hr tablet, Take 1 tablet (500 mg total) by mouth daily with breakfast., Disp: 180 tablet, Rfl: 1 .  metoprolol tartrate (LOPRESSOR) 25 MG tablet, TAKE 1 TABLET TWICE DAILY, Disp: 180 tablet, Rfl: 1 .  omeprazole (PRILOSEC) 40 MG capsule, Take 1 capsule (40 mg total) by mouth daily., Disp: 90 capsule, Rfl: 1 .  pregabalin (LYRICA) 300 MG capsule, Take 2 capsules (600 mg total) by mouth daily., Disp: 180 capsule, Rfl: 1 .  ranitidine (ZANTAC) 150 MG tablet, Take 150 mg by mouth at bedtime., Disp: , Rfl:  .  RELION INSULIN SYRINGE 1ML/31G 31G X 5/16" 1 ML MISC, , Disp: , Rfl:  .  rosuvastatin (CRESTOR) 20 MG tablet, Take 1 tablet (20 mg total) by mouth daily. In place of Pravastatin, Disp: 90 tablet, Rfl: 1 .  sertraline (ZOLOFT) 100 MG tablet, TAKE 1 TABLET EVERY DAY, Disp: 90 tablet, Rfl: 1 .  triamcinolone cream (KENALOG) 0.5 %, Apply 1 application topically 3 (three) times daily., Disp: 454 g, Rfl: 0 .  VITAMIN D, CHOLECALCIFEROL, PO, Take 20,000 Units by mouth daily., Disp: , Rfl:   Allergies  Allergen Reactions  . Atorvastatin     muscle pain  . Codeine   . Latex Rash     ROS  Ten systems reviewed and is negative except as  mentioned in HPI   Objective  Vitals:   09/19/16 1507  BP: 132/68  Pulse: 88  Resp: 16  Temp: 99 F (37.2 C)  TempSrc: Oral  SpO2: 96%  Weight: 226 lb 1 oz (102.5 kg)  Height: _0  (1.575 m)    Body mass index is 41.35 kg/m.  Physical Exam  Constitutional: Patient appears well-developed and well-nourished. Obese  No distress.  HEENT: head atraumatic, normocephalic, pupils equal and reactive to light,  neck supple, throat within normal limits Cardiovascular: Normal rate, regular rhythm and normal heart sounds.  No murmur heard. No BLE edema. Pulmonary/Chest: Effort normal and breath sounds normal. No respiratory distress. Abdominal: Soft.  There is no tenderness. Psychiatric: Patient has a normal mood and affect. behavior is normal.  Judgment and thought content normal. Muscular Skeletal: deformed left foot, history of biopsy and charcot foot  Recent Results (from the past 2160 hour(s))  COMPLETE METABOLIC PANEL WITH GFR     Status: Abnormal   Collection Time: 08/18/16 12:01 AM  Result Value Ref Range   Sodium 141 135 - 146 mmol/L   Potassium 4.4 3.5 - 5.3 mmol/L   Chloride 108 98 - 110 mmol/L   CO2 21 20 - 31 mmol/L   Glucose, Bld 89 65 - 99 mg/dL   BUN 35 (H) 7 - 25 mg/dL   Creat 1.15 (H) 0.50 - 0.99 mg/dL    Comment:   For patients > or = 68 years of age: The upper reference limit for Creatinine is approximately 13% higher for people identified as African-American.      Total Bilirubin 0.3 0.2 - 1.2 mg/dL   Alkaline Phosphatase 78 33 - 130 U/L   AST 14 10 - 35 U/L   ALT 12 6 - 29 U/L   Total Protein 6.8 6.1 - 8.1 g/dL   Albumin 4.5 3.6 - 5.1 g/dL   Calcium 9.7 8.6 - 10.4 mg/dL   GFR, Est African American 57 (L) >=60 mL/min   GFR, Est Non African American 49 (L) >=60 mL/min  Lipid panel     Status: Abnormal   Collection Time: 08/18/16 12:01 AM  Result Value Ref Range   Cholesterol 208 (H) <200 mg/dL   Triglycerides 241 (H) <150 mg/dL   HDL 38 (L) >50  mg/dL   Total CHOL/HDL Ratio 5.5 (H) <5.0 Ratio   VLDL 48 (H) <30 mg/dL   LDL Cholesterol 122 (H) <100 mg/dL  VITAMIN D 25 Hydroxy (Vit-D Deficiency, Fractures)     Status: None   Collection Time: 08/18/16 12:01 AM  Result Value Ref Range   Vit D, 25-Hydroxy 36 30 - 100 ng/mL    Comment: Vitamin D Status           25-OH Vitamin D        Deficiency                <20 ng/mL        Insufficiency         20 - 29 ng/mL        Optimal             > or = 30 ng/mL   For 25-OH Vitamin D testing on patients on D2-supplementation and patients for whom quantitation of D2 and D3 fractions is required, the QuestAssureD 25-OH VIT D, (D2,D3), LC/MS/MS is recommended: order code [id number] (patients > 2 yrs).   CBC with Differential/Platelet     Status: Abnormal   Collection Time: 08/18/16 12:01 AM  Result Value Ref Range   WBC 7.3 3.8 - 10.8 K/uL   RBC 4.32 3.80 - 5.10 MIL/uL   Hemoglobin 12.1 11.7 - 15.5 g/dL   HCT 36.7 35.0 - 45.0 %   MCV 85.0 80.0 - 100.0 fL   MCH 28.0 27.0 - 33.0 pg   MCHC 33.0 32.0 - 36.0 g/dL   RDW 15.7 (H) 11.0 - 15.0 %   Platelets 259 140 - 400 K/uL   MPV 10.2 7.5 - 12.5 fL   Neutro Abs 4,599 1,500 - 7,800 cells/uL   Lymphs Abs 1,314 850 - 3,900 cells/uL   Monocytes Absolute 876 200 - 950 cells/uL   Eosinophils Absolute 438 15 - 500 cells/uL   Basophils Absolute 73 0 - 200  cells/uL   Neutrophils Relative % 63 %   Lymphocytes Relative 18 %   Monocytes Relative 12 %   Eosinophils Relative 6 %   Basophils Relative 1 %   Smear Review Criteria for review not met   TSH     Status: None   Collection Time: 08/18/16 12:01 AM  Result Value Ref Range   TSH 1.74 mIU/L    Comment:   Reference Range   > or = 20 Years  0.40-4.50   Pregnancy Range First trimester  0.26-2.66 Second trimester 0.55-2.73 Third trimester  0.43-2.91        PHQ2/9: Depression screen [facility] 2/9 09/19/2016 08/18/2016 06/27/2016 02/22/2016 01/04/2016  Decreased Interest _0 0 1  Down, Depressed,  Hopeless _1 PHQ - 2 Score _2 Altered sleeping 0 3 1 0 3  Tired, decreased energy 0 _3 Change in appetite 0 3 3 0 1  Feeling bad or failure about yourself  - _4 0  Trouble concentrating 0 _5 Moving slowly or fidgety/restless 0 0 2 0 0  Suicidal thoughts 1 0 1 0 0  PHQ-9 Score _6 Difficult doing work/chores Somewhat difficult Very difficult Not difficult at all - -     Fall Risk: Fall Risk  09/19/2016 08/18/2016 06/27/2016 02/22/2016 01/04/2016  Falls in the past year? No Yes Yes Yes Yes  Number falls in past yr: - 1 2 or more 1 1  Injury with Fall? - No No Yes No  Risk Factor Category  - - High Fall Risk - -  Risk for fall due to : - - Impaired mobility - -  Follow up - - Education provided;Falls evaluation completed - -     Functional Status Survey: Is the patient deaf or have difficulty hearing?: No Does the patient have difficulty seeing, even when wearing glasses/contacts?: No Does the patient have difficulty concentrating, remembering, or making decisions?: Yes Does the patient have difficulty walking or climbing stairs?: Yes Does the patient have difficulty dressing or bathing?: No Does the patient have difficulty doing errands alone such as visiting a doctor's office or shopping?: No    Assessment & Plan  1. Diabetic polyneuropathy associated with type 2 diabetes mellitus ([place])  Better controlled  2. Charcot foot due to diabetes mellitus ([place])  Proceed to surgery, reviewed labs, CKI Stage III, needs to be well hydrated  3. Benign hypertension  At goal  4. Pre-operative clearance  She had foot surgery March 2017, no pre-op required prior to last surgery, last EKG was done 04/2016 and it was normal, she has DM that is under better control, last hgbA1C done by Endocrinologist was 7.3%, she is compliant with medication. Recommend a nerve block if possible, and take CPAP machine to the OR to be use of the post-op period. She has  SOB with minimal activity and was seen by cardiologist, Dr. Cathie Olden,  from 2013 till 2015 and advised to lose weight. Tests negative at the time and we will not repeat it at this time.

## 2016-09-26 ENCOUNTER — Ambulatory Visit (INDEPENDENT_AMBULATORY_CARE_PROVIDER_SITE_OTHER): Payer: Commercial Managed Care - HMO | Admitting: Licensed Clinical Social Worker

## 2016-09-26 DIAGNOSIS — F331 Major depressive disorder, recurrent, moderate: Secondary | ICD-10-CM

## 2016-10-06 DIAGNOSIS — I10 Essential (primary) hypertension: Secondary | ICD-10-CM

## 2016-10-06 DIAGNOSIS — M24575 Contracture, left foot: Secondary | ICD-10-CM | POA: Diagnosis not present

## 2016-10-06 DIAGNOSIS — M21072 Valgus deformity, not elsewhere classified, left ankle: Secondary | ICD-10-CM | POA: Diagnosis not present

## 2016-10-06 DIAGNOSIS — E1122 Type 2 diabetes mellitus with diabetic chronic kidney disease: Secondary | ICD-10-CM | POA: Diagnosis not present

## 2016-10-06 DIAGNOSIS — R0789 Other chest pain: Secondary | ICD-10-CM | POA: Diagnosis not present

## 2016-10-06 DIAGNOSIS — M2012 Hallux valgus (acquired), left foot: Secondary | ICD-10-CM | POA: Diagnosis not present

## 2016-10-06 DIAGNOSIS — R079 Chest pain, unspecified: Secondary | ICD-10-CM | POA: Insufficient documentation

## 2016-10-06 DIAGNOSIS — G8918 Other acute postprocedural pain: Secondary | ICD-10-CM | POA: Diagnosis not present

## 2016-10-06 DIAGNOSIS — M960 Pseudarthrosis after fusion or arthrodesis: Secondary | ICD-10-CM | POA: Diagnosis not present

## 2016-10-06 DIAGNOSIS — E1140 Type 2 diabetes mellitus with diabetic neuropathy, unspecified: Secondary | ICD-10-CM | POA: Diagnosis not present

## 2016-10-06 DIAGNOSIS — F329 Major depressive disorder, single episode, unspecified: Secondary | ICD-10-CM | POA: Diagnosis not present

## 2016-10-06 DIAGNOSIS — Z6841 Body Mass Index (BMI) 40.0 and over, adult: Secondary | ICD-10-CM

## 2016-10-06 DIAGNOSIS — R5082 Postprocedural fever: Secondary | ICD-10-CM | POA: Diagnosis not present

## 2016-10-06 DIAGNOSIS — I129 Hypertensive chronic kidney disease with stage 1 through stage 4 chronic kidney disease, or unspecified chronic kidney disease: Secondary | ICD-10-CM | POA: Diagnosis not present

## 2016-10-06 DIAGNOSIS — K219 Gastro-esophageal reflux disease without esophagitis: Secondary | ICD-10-CM | POA: Diagnosis not present

## 2016-10-06 DIAGNOSIS — M79672 Pain in left foot: Secondary | ICD-10-CM | POA: Diagnosis not present

## 2016-10-06 DIAGNOSIS — N183 Chronic kidney disease, stage 3 (moderate): Secondary | ICD-10-CM | POA: Diagnosis not present

## 2016-10-07 DIAGNOSIS — K219 Gastro-esophageal reflux disease without esophagitis: Secondary | ICD-10-CM | POA: Diagnosis not present

## 2016-10-07 DIAGNOSIS — M24575 Contracture, left foot: Secondary | ICD-10-CM | POA: Diagnosis not present

## 2016-10-07 DIAGNOSIS — M79672 Pain in left foot: Secondary | ICD-10-CM | POA: Diagnosis not present

## 2016-10-07 DIAGNOSIS — M21072 Valgus deformity, not elsewhere classified, left ankle: Secondary | ICD-10-CM | POA: Diagnosis not present

## 2016-10-07 DIAGNOSIS — N183 Chronic kidney disease, stage 3 (moderate): Secondary | ICD-10-CM | POA: Diagnosis not present

## 2016-10-07 DIAGNOSIS — R5082 Postprocedural fever: Secondary | ICD-10-CM | POA: Diagnosis not present

## 2016-10-07 DIAGNOSIS — E1140 Type 2 diabetes mellitus with diabetic neuropathy, unspecified: Secondary | ICD-10-CM | POA: Diagnosis not present

## 2016-10-07 DIAGNOSIS — E1122 Type 2 diabetes mellitus with diabetic chronic kidney disease: Secondary | ICD-10-CM | POA: Diagnosis not present

## 2016-10-07 DIAGNOSIS — Z6841 Body Mass Index (BMI) 40.0 and over, adult: Secondary | ICD-10-CM | POA: Diagnosis not present

## 2016-10-07 DIAGNOSIS — R079 Chest pain, unspecified: Secondary | ICD-10-CM | POA: Diagnosis not present

## 2016-10-07 DIAGNOSIS — I129 Hypertensive chronic kidney disease with stage 1 through stage 4 chronic kidney disease, or unspecified chronic kidney disease: Secondary | ICD-10-CM | POA: Diagnosis not present

## 2016-10-07 DIAGNOSIS — F329 Major depressive disorder, single episode, unspecified: Secondary | ICD-10-CM | POA: Diagnosis not present

## 2016-10-07 DIAGNOSIS — I10 Essential (primary) hypertension: Secondary | ICD-10-CM | POA: Diagnosis not present

## 2016-10-07 DIAGNOSIS — R0789 Other chest pain: Secondary | ICD-10-CM | POA: Diagnosis not present

## 2016-10-07 DIAGNOSIS — G8918 Other acute postprocedural pain: Secondary | ICD-10-CM | POA: Diagnosis not present

## 2016-10-08 DIAGNOSIS — E1140 Type 2 diabetes mellitus with diabetic neuropathy, unspecified: Secondary | ICD-10-CM | POA: Diagnosis not present

## 2016-10-08 DIAGNOSIS — I10 Essential (primary) hypertension: Secondary | ICD-10-CM | POA: Diagnosis not present

## 2016-10-08 DIAGNOSIS — I129 Hypertensive chronic kidney disease with stage 1 through stage 4 chronic kidney disease, or unspecified chronic kidney disease: Secondary | ICD-10-CM | POA: Diagnosis not present

## 2016-10-08 DIAGNOSIS — E1122 Type 2 diabetes mellitus with diabetic chronic kidney disease: Secondary | ICD-10-CM | POA: Diagnosis not present

## 2016-10-08 DIAGNOSIS — N183 Chronic kidney disease, stage 3 (moderate): Secondary | ICD-10-CM | POA: Diagnosis not present

## 2016-10-08 DIAGNOSIS — R0789 Other chest pain: Secondary | ICD-10-CM | POA: Diagnosis not present

## 2016-10-08 DIAGNOSIS — R079 Chest pain, unspecified: Secondary | ICD-10-CM | POA: Diagnosis not present

## 2016-10-08 DIAGNOSIS — F329 Major depressive disorder, single episode, unspecified: Secondary | ICD-10-CM | POA: Diagnosis not present

## 2016-10-08 DIAGNOSIS — G8918 Other acute postprocedural pain: Secondary | ICD-10-CM | POA: Diagnosis not present

## 2016-10-08 DIAGNOSIS — M21072 Valgus deformity, not elsewhere classified, left ankle: Secondary | ICD-10-CM | POA: Diagnosis not present

## 2016-10-08 DIAGNOSIS — R5082 Postprocedural fever: Secondary | ICD-10-CM | POA: Diagnosis not present

## 2016-10-08 DIAGNOSIS — M24575 Contracture, left foot: Secondary | ICD-10-CM | POA: Diagnosis not present

## 2016-10-08 DIAGNOSIS — M79672 Pain in left foot: Secondary | ICD-10-CM | POA: Diagnosis not present

## 2016-10-09 DIAGNOSIS — N183 Chronic kidney disease, stage 3 (moderate): Secondary | ICD-10-CM | POA: Diagnosis not present

## 2016-10-09 DIAGNOSIS — R5082 Postprocedural fever: Secondary | ICD-10-CM | POA: Diagnosis not present

## 2016-10-09 DIAGNOSIS — M1460 Charcot's joint, unspecified site: Secondary | ICD-10-CM | POA: Diagnosis not present

## 2016-10-09 DIAGNOSIS — R0789 Other chest pain: Secondary | ICD-10-CM | POA: Diagnosis not present

## 2016-10-09 DIAGNOSIS — R079 Chest pain, unspecified: Secondary | ICD-10-CM | POA: Diagnosis not present

## 2016-10-09 DIAGNOSIS — F329 Major depressive disorder, single episode, unspecified: Secondary | ICD-10-CM | POA: Diagnosis not present

## 2016-10-09 DIAGNOSIS — G8918 Other acute postprocedural pain: Secondary | ICD-10-CM | POA: Diagnosis not present

## 2016-10-09 DIAGNOSIS — I10 Essential (primary) hypertension: Secondary | ICD-10-CM | POA: Diagnosis not present

## 2016-10-09 DIAGNOSIS — E11610 Type 2 diabetes mellitus with diabetic neuropathic arthropathy: Secondary | ICD-10-CM | POA: Diagnosis not present

## 2016-10-09 DIAGNOSIS — M24572 Contracture, left ankle: Secondary | ICD-10-CM | POA: Diagnosis not present

## 2016-10-09 DIAGNOSIS — M21072 Valgus deformity, not elsewhere classified, left ankle: Secondary | ICD-10-CM | POA: Diagnosis not present

## 2016-10-09 DIAGNOSIS — E1122 Type 2 diabetes mellitus with diabetic chronic kidney disease: Secondary | ICD-10-CM | POA: Diagnosis not present

## 2016-10-09 DIAGNOSIS — I129 Hypertensive chronic kidney disease with stage 1 through stage 4 chronic kidney disease, or unspecified chronic kidney disease: Secondary | ICD-10-CM | POA: Diagnosis not present

## 2016-10-09 DIAGNOSIS — E1140 Type 2 diabetes mellitus with diabetic neuropathy, unspecified: Secondary | ICD-10-CM | POA: Diagnosis not present

## 2016-10-09 DIAGNOSIS — M79672 Pain in left foot: Secondary | ICD-10-CM | POA: Diagnosis not present

## 2016-10-09 DIAGNOSIS — M24575 Contracture, left foot: Secondary | ICD-10-CM | POA: Diagnosis not present

## 2016-10-09 NOTE — Progress Notes (Signed)
   THERAPIST PROGRESS NOTE  Session Time: 34min  Participation Level: Active  Behavioral Response: CasualAlertDepressed  Type of Therapy: Individual Therapy  Treatment Goals addressed: Coping  Interventions: CBT, Motivational Interviewing, Solution Focused, Strength-based, Family Systems and Reframing  Summary: Summer [name] is a 66 y.o. female who presents with continued symptoms of her diagnosis.  She was able to vent her frustrations with herself and her current medical concerns.  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  She continues to cry often and have difficulty with sleep.  LCSW discussed what psychotherapy is and is not and the importance of the therapeutic relationship to include open and honest communication between client and therapist and building trust.  Reviewed advantages and disadvantages of the therapeutic process and limitations to the therapeutic relationship including LCSW's role in maintaining the safety of the client, others and those in client's care.   Suicidal/Homicidal: Nowithout intent/plan  Therapist Response: Assessed mood.  Actively listened as she listed current stressors.  Explored thoughts and feelings regarding financial stressors while out for surgery.  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  Plan: Return again in after surgery weeks.  Diagnosis: Axis I: Major Depression    Axis II: No diagnosis    Lubertha South, LCSW 10/02/2016

## 2016-10-11 ENCOUNTER — Telehealth: Payer: Self-pay | Admitting: Family Medicine

## 2016-10-11 DIAGNOSIS — E1165 Type 2 diabetes mellitus with hyperglycemia: Secondary | ICD-10-CM | POA: Diagnosis not present

## 2016-10-11 DIAGNOSIS — I129 Hypertensive chronic kidney disease with stage 1 through stage 4 chronic kidney disease, or unspecified chronic kidney disease: Secondary | ICD-10-CM | POA: Diagnosis not present

## 2016-10-11 DIAGNOSIS — Z4789 Encounter for other orthopedic aftercare: Secondary | ICD-10-CM | POA: Diagnosis not present

## 2016-10-11 DIAGNOSIS — N183 Chronic kidney disease, stage 3 (moderate): Secondary | ICD-10-CM | POA: Diagnosis not present

## 2016-10-11 DIAGNOSIS — Z7982 Long term (current) use of aspirin: Secondary | ICD-10-CM | POA: Diagnosis not present

## 2016-10-11 DIAGNOSIS — Z6841 Body Mass Index (BMI) 40.0 and over, adult: Secondary | ICD-10-CM | POA: Diagnosis not present

## 2016-10-11 DIAGNOSIS — Z794 Long term (current) use of insulin: Secondary | ICD-10-CM | POA: Diagnosis not present

## 2016-10-11 DIAGNOSIS — E1122 Type 2 diabetes mellitus with diabetic chronic kidney disease: Secondary | ICD-10-CM | POA: Diagnosis not present

## 2016-10-11 NOTE — Telephone Encounter (Signed)
Merry Proud from [facility] (PT) states that he is doing admissions. went through medication. He came across Gemfibrozil 600 mg will interact with Rosuvastatin 20 mg which both is level one. Please return his call [phone]

## 2016-10-12 ENCOUNTER — Ambulatory Visit: Payer: Commercial Managed Care - HMO | Admitting: Psychiatry

## 2016-10-12 ENCOUNTER — Other Ambulatory Visit: Payer: Self-pay | Admitting: Family Medicine

## 2016-10-12 MED ORDER — OMEGA-3-ACID ETHYL ESTERS 1 G PO CAPS: 2.0000 g | ORAL_CAPSULE | Freq: Two times a day (BID) | ORAL | 1 refills | Status: DC

## 2016-10-12 NOTE — Telephone Encounter (Signed)
Spoke to [place] and he stated that he would inform pt

## 2016-10-12 NOTE — Telephone Encounter (Signed)
Gemfibrozil is no longer on her active list. She should have been on Lovaza, but I think it was very expensive. Sending another refill

## 2016-10-17 DIAGNOSIS — I129 Hypertensive chronic kidney disease with stage 1 through stage 4 chronic kidney disease, or unspecified chronic kidney disease: Secondary | ICD-10-CM | POA: Diagnosis not present

## 2016-10-17 DIAGNOSIS — N183 Chronic kidney disease, stage 3 (moderate): Secondary | ICD-10-CM | POA: Diagnosis not present

## 2016-10-17 DIAGNOSIS — Z794 Long term (current) use of insulin: Secondary | ICD-10-CM | POA: Diagnosis not present

## 2016-10-17 DIAGNOSIS — Z7982 Long term (current) use of aspirin: Secondary | ICD-10-CM | POA: Diagnosis not present

## 2016-10-17 DIAGNOSIS — E1122 Type 2 diabetes mellitus with diabetic chronic kidney disease: Secondary | ICD-10-CM | POA: Diagnosis not present

## 2016-10-17 DIAGNOSIS — Z6841 Body Mass Index (BMI) 40.0 and over, adult: Secondary | ICD-10-CM | POA: Diagnosis not present

## 2016-10-17 DIAGNOSIS — E1165 Type 2 diabetes mellitus with hyperglycemia: Secondary | ICD-10-CM | POA: Diagnosis not present

## 2016-10-17 DIAGNOSIS — Z4789 Encounter for other orthopedic aftercare: Secondary | ICD-10-CM | POA: Diagnosis not present

## 2016-10-18 DIAGNOSIS — Z4789 Encounter for other orthopedic aftercare: Secondary | ICD-10-CM | POA: Diagnosis not present

## 2016-10-19 DIAGNOSIS — Z4789 Encounter for other orthopedic aftercare: Secondary | ICD-10-CM | POA: Diagnosis not present

## 2016-10-19 DIAGNOSIS — E1165 Type 2 diabetes mellitus with hyperglycemia: Secondary | ICD-10-CM | POA: Diagnosis not present

## 2016-10-19 DIAGNOSIS — E1122 Type 2 diabetes mellitus with diabetic chronic kidney disease: Secondary | ICD-10-CM | POA: Diagnosis not present

## 2016-10-19 DIAGNOSIS — Z7982 Long term (current) use of aspirin: Secondary | ICD-10-CM | POA: Diagnosis not present

## 2016-10-19 DIAGNOSIS — N183 Chronic kidney disease, stage 3 (moderate): Secondary | ICD-10-CM | POA: Diagnosis not present

## 2016-10-19 DIAGNOSIS — Z794 Long term (current) use of insulin: Secondary | ICD-10-CM | POA: Diagnosis not present

## 2016-10-19 DIAGNOSIS — Z6841 Body Mass Index (BMI) 40.0 and over, adult: Secondary | ICD-10-CM | POA: Diagnosis not present

## 2016-10-19 DIAGNOSIS — M24572 Contracture, left ankle: Secondary | ICD-10-CM | POA: Diagnosis not present

## 2016-10-19 DIAGNOSIS — I129 Hypertensive chronic kidney disease with stage 1 through stage 4 chronic kidney disease, or unspecified chronic kidney disease: Secondary | ICD-10-CM | POA: Diagnosis not present

## 2016-10-23 DIAGNOSIS — Z794 Long term (current) use of insulin: Secondary | ICD-10-CM | POA: Diagnosis not present

## 2016-10-23 DIAGNOSIS — E1165 Type 2 diabetes mellitus with hyperglycemia: Secondary | ICD-10-CM | POA: Diagnosis not present

## 2016-10-23 DIAGNOSIS — E781 Pure hyperglyceridemia: Secondary | ICD-10-CM | POA: Diagnosis not present

## 2016-10-26 ENCOUNTER — Ambulatory Visit: Payer: Medicare PPO | Admitting: Podiatry

## 2016-10-26 DIAGNOSIS — Z7982 Long term (current) use of aspirin: Secondary | ICD-10-CM | POA: Diagnosis not present

## 2016-10-26 DIAGNOSIS — I129 Hypertensive chronic kidney disease with stage 1 through stage 4 chronic kidney disease, or unspecified chronic kidney disease: Secondary | ICD-10-CM | POA: Diagnosis not present

## 2016-10-26 DIAGNOSIS — Z4789 Encounter for other orthopedic aftercare: Secondary | ICD-10-CM | POA: Diagnosis not present

## 2016-10-26 DIAGNOSIS — Z6841 Body Mass Index (BMI) 40.0 and over, adult: Secondary | ICD-10-CM | POA: Diagnosis not present

## 2016-10-26 DIAGNOSIS — E1122 Type 2 diabetes mellitus with diabetic chronic kidney disease: Secondary | ICD-10-CM | POA: Diagnosis not present

## 2016-10-26 DIAGNOSIS — N183 Chronic kidney disease, stage 3 (moderate): Secondary | ICD-10-CM | POA: Diagnosis not present

## 2016-10-26 DIAGNOSIS — E1165 Type 2 diabetes mellitus with hyperglycemia: Secondary | ICD-10-CM | POA: Diagnosis not present

## 2016-10-26 DIAGNOSIS — Z794 Long term (current) use of insulin: Secondary | ICD-10-CM | POA: Diagnosis not present

## 2016-10-30 DIAGNOSIS — Z794 Long term (current) use of insulin: Secondary | ICD-10-CM | POA: Diagnosis not present

## 2016-10-30 DIAGNOSIS — Z6841 Body Mass Index (BMI) 40.0 and over, adult: Secondary | ICD-10-CM | POA: Diagnosis not present

## 2016-10-30 DIAGNOSIS — E1165 Type 2 diabetes mellitus with hyperglycemia: Secondary | ICD-10-CM | POA: Diagnosis not present

## 2016-10-30 DIAGNOSIS — E1122 Type 2 diabetes mellitus with diabetic chronic kidney disease: Secondary | ICD-10-CM | POA: Diagnosis not present

## 2016-10-30 DIAGNOSIS — Z7982 Long term (current) use of aspirin: Secondary | ICD-10-CM | POA: Diagnosis not present

## 2016-10-30 DIAGNOSIS — Z4789 Encounter for other orthopedic aftercare: Secondary | ICD-10-CM | POA: Diagnosis not present

## 2016-10-30 DIAGNOSIS — N183 Chronic kidney disease, stage 3 (moderate): Secondary | ICD-10-CM | POA: Diagnosis not present

## 2016-10-30 DIAGNOSIS — I129 Hypertensive chronic kidney disease with stage 1 through stage 4 chronic kidney disease, or unspecified chronic kidney disease: Secondary | ICD-10-CM | POA: Diagnosis not present

## 2016-11-01 DIAGNOSIS — Z4889 Encounter for other specified surgical aftercare: Secondary | ICD-10-CM | POA: Diagnosis not present

## 2016-11-01 DIAGNOSIS — Z4789 Encounter for other orthopedic aftercare: Secondary | ICD-10-CM | POA: Diagnosis not present

## 2016-11-06 DIAGNOSIS — Z794 Long term (current) use of insulin: Secondary | ICD-10-CM | POA: Diagnosis not present

## 2016-11-06 DIAGNOSIS — I129 Hypertensive chronic kidney disease with stage 1 through stage 4 chronic kidney disease, or unspecified chronic kidney disease: Secondary | ICD-10-CM | POA: Diagnosis not present

## 2016-11-06 DIAGNOSIS — Z4789 Encounter for other orthopedic aftercare: Secondary | ICD-10-CM | POA: Diagnosis not present

## 2016-11-06 DIAGNOSIS — Z6841 Body Mass Index (BMI) 40.0 and over, adult: Secondary | ICD-10-CM | POA: Diagnosis not present

## 2016-11-06 DIAGNOSIS — N183 Chronic kidney disease, stage 3 (moderate): Secondary | ICD-10-CM | POA: Diagnosis not present

## 2016-11-06 DIAGNOSIS — E1165 Type 2 diabetes mellitus with hyperglycemia: Secondary | ICD-10-CM | POA: Diagnosis not present

## 2016-11-06 DIAGNOSIS — E1122 Type 2 diabetes mellitus with diabetic chronic kidney disease: Secondary | ICD-10-CM | POA: Diagnosis not present

## 2016-11-06 DIAGNOSIS — Z7982 Long term (current) use of aspirin: Secondary | ICD-10-CM | POA: Diagnosis not present

## 2016-11-08 DIAGNOSIS — E11610 Type 2 diabetes mellitus with diabetic neuropathic arthropathy: Secondary | ICD-10-CM | POA: Diagnosis not present

## 2016-11-08 DIAGNOSIS — M1460 Charcot's joint, unspecified site: Secondary | ICD-10-CM | POA: Diagnosis not present

## 2016-11-15 DIAGNOSIS — Z4789 Encounter for other orthopedic aftercare: Secondary | ICD-10-CM | POA: Diagnosis not present

## 2016-11-27 ENCOUNTER — Other Ambulatory Visit: Payer: Self-pay

## 2016-11-27 DIAGNOSIS — E1142 Type 2 diabetes mellitus with diabetic polyneuropathy: Secondary | ICD-10-CM

## 2016-11-27 MED ORDER — PREGABALIN 300 MG PO CAPS: 600.0000 mg | ORAL_CAPSULE | Freq: Every day | ORAL | 1 refills | Status: DC

## 2016-11-28 ENCOUNTER — Other Ambulatory Visit: Payer: Self-pay

## 2016-11-28 DIAGNOSIS — Z4889 Encounter for other specified surgical aftercare: Secondary | ICD-10-CM | POA: Diagnosis not present

## 2016-11-28 DIAGNOSIS — E781 Pure hyperglyceridemia: Secondary | ICD-10-CM | POA: Diagnosis not present

## 2016-11-28 DIAGNOSIS — Z794 Long term (current) use of insulin: Secondary | ICD-10-CM | POA: Diagnosis not present

## 2016-11-28 DIAGNOSIS — E1165 Type 2 diabetes mellitus with hyperglycemia: Secondary | ICD-10-CM | POA: Diagnosis not present

## 2016-11-28 DIAGNOSIS — Z4789 Encounter for other orthopedic aftercare: Secondary | ICD-10-CM | POA: Diagnosis not present

## 2016-11-28 DIAGNOSIS — E1142 Type 2 diabetes mellitus with diabetic polyneuropathy: Secondary | ICD-10-CM

## 2016-11-28 LAB — LIPID PANEL
Cholesterol: 198 (ref 0–200)
HDL: 42 (ref 35–70)
LDL Cholesterol: 79
Triglycerides: 386 — AB (ref 40–160)

## 2016-11-28 MED ORDER — LEVOTHYROXINE SODIUM 25 MCG PO TABS: ORAL_TABLET | ORAL | 0 refills | Status: DC

## 2016-11-28 MED ORDER — METFORMIN HCL ER 500 MG PO TB24: 500.0000 mg | ORAL_TABLET | Freq: Every day | ORAL | 1 refills | Status: DC

## 2016-11-28 MED ORDER — MONTELUKAST SODIUM 10 MG PO TABS: 10.0000 mg | ORAL_TABLET | Freq: Every day | ORAL | 1 refills | Status: DC

## 2016-11-28 NOTE — Telephone Encounter (Signed)
Patient requesting refill of Singular, Levothyroxine and Metformin to [facility].

## 2016-12-05 DIAGNOSIS — E11610 Type 2 diabetes mellitus with diabetic neuropathic arthropathy: Secondary | ICD-10-CM | POA: Diagnosis not present

## 2016-12-05 DIAGNOSIS — Z79899 Other long term (current) drug therapy: Secondary | ICD-10-CM | POA: Diagnosis not present

## 2016-12-05 DIAGNOSIS — E781 Pure hyperglyceridemia: Secondary | ICD-10-CM | POA: Diagnosis not present

## 2016-12-07 DIAGNOSIS — E11610 Type 2 diabetes mellitus with diabetic neuropathic arthropathy: Secondary | ICD-10-CM | POA: Diagnosis not present

## 2016-12-07 DIAGNOSIS — M1460 Charcot's joint, unspecified site: Secondary | ICD-10-CM | POA: Diagnosis not present

## 2016-12-11 DIAGNOSIS — Z4789 Encounter for other orthopedic aftercare: Secondary | ICD-10-CM | POA: Diagnosis not present

## 2016-12-25 DIAGNOSIS — Z4789 Encounter for other orthopedic aftercare: Secondary | ICD-10-CM | POA: Diagnosis not present

## 2016-12-25 DIAGNOSIS — I10 Essential (primary) hypertension: Secondary | ICD-10-CM | POA: Diagnosis not present

## 2017-01-07 DIAGNOSIS — M1460 Charcot's joint, unspecified site: Secondary | ICD-10-CM | POA: Diagnosis not present

## 2017-01-07 DIAGNOSIS — E11610 Type 2 diabetes mellitus with diabetic neuropathic arthropathy: Secondary | ICD-10-CM | POA: Diagnosis not present

## 2017-01-08 DIAGNOSIS — Z4789 Encounter for other orthopedic aftercare: Secondary | ICD-10-CM | POA: Diagnosis not present

## 2017-01-08 DIAGNOSIS — I10 Essential (primary) hypertension: Secondary | ICD-10-CM | POA: Diagnosis not present

## 2017-01-22 DIAGNOSIS — M1460 Charcot's joint, unspecified site: Secondary | ICD-10-CM | POA: Diagnosis not present

## 2017-01-22 DIAGNOSIS — Z4789 Encounter for other orthopedic aftercare: Secondary | ICD-10-CM | POA: Diagnosis not present

## 2017-01-22 DIAGNOSIS — M24572 Contracture, left ankle: Secondary | ICD-10-CM | POA: Diagnosis not present

## 2017-01-22 DIAGNOSIS — M069 Rheumatoid arthritis, unspecified: Secondary | ICD-10-CM | POA: Diagnosis not present

## 2017-01-22 DIAGNOSIS — M21072 Valgus deformity, not elsewhere classified, left ankle: Secondary | ICD-10-CM | POA: Diagnosis not present

## 2017-01-23 ENCOUNTER — Other Ambulatory Visit: Payer: Self-pay

## 2017-01-23 DIAGNOSIS — E1142 Type 2 diabetes mellitus with diabetic polyneuropathy: Secondary | ICD-10-CM

## 2017-01-23 MED ORDER — PREGABALIN 300 MG PO CAPS: 600.0000 mg | ORAL_CAPSULE | Freq: Every day | ORAL | 1 refills | Status: DC

## 2017-01-30 DIAGNOSIS — E1165 Type 2 diabetes mellitus with hyperglycemia: Secondary | ICD-10-CM | POA: Diagnosis not present

## 2017-01-30 DIAGNOSIS — Z6841 Body Mass Index (BMI) 40.0 and over, adult: Secondary | ICD-10-CM | POA: Diagnosis not present

## 2017-01-30 DIAGNOSIS — E118 Type 2 diabetes mellitus with unspecified complications: Secondary | ICD-10-CM | POA: Diagnosis not present

## 2017-01-30 DIAGNOSIS — E781 Pure hyperglyceridemia: Secondary | ICD-10-CM | POA: Diagnosis not present

## 2017-01-30 DIAGNOSIS — Z794 Long term (current) use of insulin: Secondary | ICD-10-CM | POA: Diagnosis not present

## 2017-01-30 DIAGNOSIS — Z79899 Other long term (current) drug therapy: Secondary | ICD-10-CM | POA: Diagnosis not present

## 2017-01-31 ENCOUNTER — Other Ambulatory Visit: Payer: Self-pay | Admitting: Family Medicine

## 2017-01-31 DIAGNOSIS — G44229 Chronic tension-type headache, not intractable: Secondary | ICD-10-CM

## 2017-01-31 DIAGNOSIS — G4709 Other insomnia: Secondary | ICD-10-CM

## 2017-01-31 DIAGNOSIS — G4700 Insomnia, unspecified: Secondary | ICD-10-CM

## 2017-02-01 MED ORDER — AMITRIPTYLINE HCL 50 MG PO TABS: 50.0000 mg | ORAL_TABLET | Freq: Every day | ORAL | 0 refills | Status: DC

## 2017-02-01 NOTE — Telephone Encounter (Signed)
Last seen 09/19/16

## 2017-02-01 NOTE — Telephone Encounter (Signed)
I'm sending in a 30 day supply to the [facility] on [address] so that she has enough to get her through to her appointment on February 19, 2017 at 10:40. Could you please call and let her know this and remind her of her appointment? Thank you!

## 2017-02-02 NOTE — Telephone Encounter (Signed)
LMOM to inform pt °

## 2017-02-02 NOTE — Telephone Encounter (Signed)
ERRENOUS °

## 2017-02-06 DIAGNOSIS — M1460 Charcot's joint, unspecified site: Secondary | ICD-10-CM | POA: Diagnosis not present

## 2017-02-06 DIAGNOSIS — E11610 Type 2 diabetes mellitus with diabetic neuropathic arthropathy: Secondary | ICD-10-CM | POA: Diagnosis not present

## 2017-02-06 DIAGNOSIS — Z4789 Encounter for other orthopedic aftercare: Secondary | ICD-10-CM | POA: Diagnosis not present

## 2017-02-08 ENCOUNTER — Other Ambulatory Visit: Payer: Self-pay | Admitting: Family Medicine

## 2017-02-08 DIAGNOSIS — E785 Hyperlipidemia, unspecified: Secondary | ICD-10-CM

## 2017-02-09 ENCOUNTER — Encounter: Payer: Self-pay | Admitting: Family Medicine

## 2017-02-19 ENCOUNTER — Encounter: Payer: Self-pay | Admitting: Family Medicine

## 2017-02-19 ENCOUNTER — Ambulatory Visit (INDEPENDENT_AMBULATORY_CARE_PROVIDER_SITE_OTHER): Payer: Medicare PPO | Admitting: Family Medicine

## 2017-02-19 VITALS — BP 130/68 | HR 109 | Temp 98.0°F | Resp 16 | Ht 62.0 in | Wt 224.0 lb

## 2017-02-19 DIAGNOSIS — E1169 Type 2 diabetes mellitus with other specified complication: Secondary | ICD-10-CM | POA: Diagnosis not present

## 2017-02-19 DIAGNOSIS — G4709 Other insomnia: Secondary | ICD-10-CM | POA: Diagnosis not present

## 2017-02-19 DIAGNOSIS — E2839 Other primary ovarian failure: Secondary | ICD-10-CM | POA: Diagnosis not present

## 2017-02-19 DIAGNOSIS — G4733 Obstructive sleep apnea (adult) (pediatric): Secondary | ICD-10-CM

## 2017-02-19 DIAGNOSIS — F339 Major depressive disorder, recurrent, unspecified: Secondary | ICD-10-CM

## 2017-02-19 DIAGNOSIS — E11610 Type 2 diabetes mellitus with diabetic neuropathic arthropathy: Secondary | ICD-10-CM

## 2017-02-19 DIAGNOSIS — I10 Essential (primary) hypertension: Secondary | ICD-10-CM

## 2017-02-19 DIAGNOSIS — E038 Other specified hypothyroidism: Secondary | ICD-10-CM | POA: Diagnosis not present

## 2017-02-19 DIAGNOSIS — N183 Chronic kidney disease, stage 3 unspecified: Secondary | ICD-10-CM

## 2017-02-19 DIAGNOSIS — E785 Hyperlipidemia, unspecified: Secondary | ICD-10-CM | POA: Diagnosis not present

## 2017-02-19 DIAGNOSIS — Z1231 Encounter for screening mammogram for malignant neoplasm of breast: Secondary | ICD-10-CM

## 2017-02-19 LAB — COMPLETE METABOLIC PANEL WITH GFR
ALT: 20 U/L (ref 6–29)
AST: 18 U/L (ref 10–35)
Albumin: 3.9 g/dL (ref 3.6–5.1)
Alkaline Phosphatase: 90 U/L (ref 33–130)
BUN: 23 mg/dL (ref 7–25)
CO2: 25 mmol/L (ref 20–31)
Calcium: 9.5 mg/dL (ref 8.6–10.4)
Chloride: 103 mmol/L (ref 98–110)
Creat: 0.95 mg/dL (ref 0.50–0.99)
GFR, Est African American: 71 mL/min (ref >=60)
GFR, Est Non African American: 62 mL/min (ref >=60)
Glucose, Bld: 204 mg/dL — ABNORMAL HIGH (ref 65–99)
Potassium: 4.6 mmol/L (ref 3.5–5.3)
Sodium: 140 mmol/L (ref 135–146)
Total Bilirubin: 0.4 mg/dL (ref 0.2–1.2)
Total Protein: 6.3 g/dL (ref 6.1–8.1)

## 2017-02-19 LAB — POCT UA - MICROALBUMIN: Microalbumin Ur, POC: 20 mg/L

## 2017-02-19 LAB — TSH: TSH: 1.87 mIU/L

## 2017-02-19 LAB — POCT GLYCOSYLATED HEMOGLOBIN (HGB A1C): Hemoglobin A1C: 8.1

## 2017-02-19 MED ORDER — LOSARTAN POTASSIUM-HCTZ 100-12.5 MG PO TABS: 1.0000 | ORAL_TABLET | Freq: Every day | ORAL | 1 refills | Status: DC

## 2017-02-19 MED ORDER — METOPROLOL TARTRATE 25 MG PO TABS: 25.0000 mg | ORAL_TABLET | Freq: Two times a day (BID) | ORAL | 1 refills | Status: DC

## 2017-02-19 NOTE — Progress Notes (Signed)
Name: Summer [name]   MRN: [id number]    DOB: 02/02/1951   Date:02/19/2017       Progress Note  Subjective  Chief Complaint  Chief Complaint  Patient presents with  . Diabetes    6 month follow up checks 4 times daily  . Hypertension  . Medication Refill  . Gastroesophageal Reflux  . Depression    HPI  DMII with Charcot foot , dyslipidemia and CKI: seeing Dr. Graceann [name], had diabetic teaching class, glucose is better controlled, however had gone up from March until now, explained importance of resuming diet.   Chronic Depression: she is taking medications , but is getting more depressed and she eats to control the anxiety and sorrows. She saw therapist only once but never the psychiatrist , she has a supportive boyfriend ( living together for the past 23 years ) and also friends. She denies crying spells, but has anhedonia, feeling sad and feels like the depression is getting worse again  Decrease in exercise tolerance: she has seen cardiologist and pulmonologist. Had spirometry and stress test, and likely secondary to inactivity and also obesity. She continues to have difficulty breathing.   Dyslipidemia: triglycerides has been elevated, she has a better diet, she is taking otc fish oil and her levels improved on her last check at Endo office  Hypothyroidism: she is taking medication daily, no recent constipation, but has noticed hair loss again and we will recheck levels  GERD: taking omeprazole and reflux is under control, denies heartburn or regurgitation  HTN: taking medication and denies side effects, no chest pain or palpitation  Tardive dyskinesia: stable  Insomnia: she is off Ambien because she was sleep walking, taking Elavil and it takes hours to fall asleep, advised to discuss with psychiatrist  OSA: not compliant with CPAP , unable to tolerate  S/p Right foot surgery: January 26th, 2018 still wearing a boot  Patient Active Problem List   Diagnosis Date Noted   . Dyslipidemia associated with type 2 diabetes mellitus ([place]) 08/18/2016  . Charcot foot due to diabetes mellitus ([place]) 02/22/2016  . Hypertriglyceridemia 07/27/2015  . Acquired abduction deformity of foot 07/12/2015  . Osteoarthritis of subtalar joint 07/12/2015  . Polyneuropathy in diseases classified elsewhere ([place]) 07/12/2015  . Arthritis of foot, degenerative 07/12/2015  . Carpal tunnel syndrome 04/17/2015  . Chronic constipation 04/17/2015  . Insomnia, persistent 04/17/2015  . Chronic kidney disease (CKD), stage III (moderate) 04/17/2015  . Decreased exercise tolerance 04/17/2015  . Diabetes mellitus with polyneuropathy ([place]) 04/17/2015  . Gastro-esophageal reflux disease without esophagitis 04/17/2015  . Bursitis, trochanteric 04/17/2015  . Cephalalgia 04/17/2015  . Benign hypertension 04/17/2015  . Adult hypothyroidism 04/17/2015  . Hearing loss 04/17/2015  . Chronic recurrent major depressive disorder ([place]) 04/17/2015  . Neurogenic claudication 04/17/2015  . Obesity (BMI 35.0-39.9 without comorbidity) 04/17/2015  . Hypo-ovarianism 04/17/2015  . Perennial allergic rhinitis with seasonal variation 04/17/2015  . Acne erythematosa 04/17/2015  . Dyskinesia, tardive 04/17/2015  . Memory loss 04/17/2015  . Impingement syndrome of shoulder 04/17/2015  . Dermatitis, stasis 04/17/2015  . Obstructive sleep apnea 05/14/2014  . Dyspnea 05/06/2014  . Hyperlipidemia 02/06/2012  . LBP (low back pain) 09/16/2008    Past Surgical History:  Procedure Laterality Date  . CATARACT EXTRACTION  01/2011   right  . eye lid surgery  2013   bilateral  . NECK SURGERY    . TUBAL LIGATION    . VAGINAL HYSTERECTOMY  1989    Family History  Problem Relation Age of Onset  . Heart attack Mother   . Aneurysm Mother     Social History   Social History  . Marital status: Widowed    Spouse name: N/A  . Number of children: N/A  . Years of education: N/A   Occupational History  . Not on  file.   Social History Main Topics  . Smoking status: Never Smoker  . Smokeless tobacco: Never Used  . Alcohol use 0.0 - 0.6 oz/week  . Drug use: No  . Sexual activity: Yes    Partners: Male   Other Topics Concern  . Not on file   Social History Narrative  . No narrative on file     Current Outpatient Prescriptions:  .  amitriptyline (ELAVIL) 50 MG tablet, Take 1 tablet (50 mg total) by mouth at bedtime. Take 1/2 to 1 tab by mouth at bedtime., Disp: 30 tablet, Rfl: 0 .  ASPIRIN LOW DOSE 81 MG EC tablet, TAKE 1 TABLET (81 MG TOTAL) BY MOUTH DAILY., Disp: 30 tablet, Rfl: 0 .  Blood Glucose Monitoring Suppl (ACCU-CHEK AVIVA PLUS) w/Device KIT, , Disp: , Rfl:  .  diclofenac sodium (VOLTAREN) 1 % GEL, Apply topically as needed. Reported on 02/22/2016, Disp: , Rfl:  .  Glucose Blood (BLOOD GLUCOSE TEST STRIPS) STRP, , Disp: , Rfl:  .  Insulin Glargine (LANTUS SOLOSTAR) 100 UNIT/ML Solostar Pen, Inject 50 Units into the skin daily at 10 pm. (Patient taking differently: Inject 60 Units into the skin daily at 10 pm. ), Disp: 15 mL, Rfl: 2 .  Insulin Pen Needle (RELION PEN NEEDLE 31G/8MM) 31G X 8 MM MISC, by Does not apply route 2 (two) times daily., Disp: , Rfl:  .  insulin regular (NOVOLIN R,HUMULIN R) 100 units/mL injection, Inject 0.1 mLs (10 Units total) into the skin 3 (three) times daily before meals. (Patient taking differently: Inject 28 Units into the skin 3 (three) times daily before meals. ), Disp: 10 mL, Rfl: 0 .  lamoTRIgine (LAMICTAL) 25 MG tablet, TAKE 2 TABLETS EVERY DAY, Disp: 180 tablet, Rfl: 1 .  levothyroxine (SYNTHROID, LEVOTHROID) 25 MCG tablet, TAKE 1 TABLET EVERY DAY  EXCEPT TAKE 2 TABLETS  ON  SUNDAYS, Disp: 100 tablet, Rfl: 0 .  losartan-hydrochlorothiazide (HYZAAR) 100-12.5 MG tablet, Take 1 tablet by mouth daily., Disp: 90 tablet, Rfl: 1 .  metFORMIN (GLUCOPHAGE-XR) 500 MG 24 hr tablet, Take 1 tablet (500 mg total) by mouth daily with breakfast., Disp: 180 tablet, Rfl:  1 .  metoprolol tartrate (LOPRESSOR) 25 MG tablet, Take 1 tablet (25 mg total) by mouth 2 (two) times daily., Disp: 180 tablet, Rfl: 1 .  montelukast (SINGULAIR) 10 MG tablet, Take 1 tablet (10 mg total) by mouth at bedtime., Disp: 90 tablet, Rfl: 1 .  omega-3 acid ethyl esters (LOVAZA) 1 g capsule, Take 2 capsules (2 g total) by mouth 2 (two) times daily., Disp: 360 capsule, Rfl: 1 .  omeprazole (PRILOSEC) 40 MG capsule, Take 1 capsule (40 mg total) by mouth daily., Disp: 90 capsule, Rfl: 1 .  pregabalin (LYRICA) 300 MG capsule, Take 2 capsules (600 mg total) by mouth daily., Disp: 180 capsule, Rfl: 1 .  ranitidine (ZANTAC) 150 MG tablet, Take 150 mg by mouth at bedtime., Disp: , Rfl:  .  RELION INSULIN SYRINGE 1ML/31G 31G X 5/16" 1 ML MISC, , Disp: , Rfl:  .  rosuvastatin (CRESTOR) 20 MG tablet, TAKE 1 TABLET DAILY. REPLACES PRAVASTATIN, Disp: 90 tablet, Rfl: 1 .  sertraline (ZOLOFT) 100 MG tablet, TAKE 1 TABLET EVERY DAY, Disp: 90 tablet, Rfl: 1 .  triamcinolone cream (KENALOG) 0.5 %, Apply 1 application topically 3 (three) times daily., Disp: 454 g, Rfl: 0 .  VITAMIN D, CHOLECALCIFEROL, PO, Take 20,000 Units by mouth daily., Disp: , Rfl:   Allergies  Allergen Reactions  . Codeine Other (See Comments)    "TRIPPED OUT"  DIDN'T LIKE THE [place]  . Atorvastatin     muscle pain  . Latex Rash  . Zolpidem Other (See Comments)    Sleep walk     ROS  Constitutional: Negative for fever, or  weight change.  Respiratory: Negative for cough but has shortness of breath.   Cardiovascular: Negative for chest pain or palpitations.  Gastrointestinal: Negative for abdominal pain, no bowel changes.  Musculoskeletal: Positive for gait problem but no  joint swelling.  Skin: Negative for rash.  Neurological: Negative for dizziness or headache.  No other specific complaints in a complete review of systems (except as listed in HPI above).  Objective  Vitals:   02/19/17 1037  BP: 130/68   Pulse: (!) 109  Resp: 16  Temp: 98 F (36.7 C)  SpO2: 97%  Weight: 224 lb (101.6 kg)  Height: '5\' 2"'$  (1.575 m)    Body mass index is 40.97 kg/m.  Physical Exam    Constitutional: Patient appears well-developed and well-nourished. Obese No distress.  HEENT: head atraumatic, normocephalic, pupils equal and reactive to light, neck supple, throat within normal limits Cardiovascular: Normal rate, regular rhythm and normal heart sounds.  No murmur heard. No BLE edema. Pulmonary/Chest: Effort normal and breath sounds normal. No respiratory distress. Abdominal: Soft.  There is no tenderness. Psychiatric: Patient has a deressed mood and affect. behavior is normal. Judgment and thought content normal.   Recent Results (from the past 2160 hour(s))  Lipid panel     Status: Abnormal   Collection Time: 11/28/16 12:00 AM  Result Value Ref Range   Triglycerides 386 (A) 40 - 160   Cholesterol 198 0 - 200   HDL 42 35 - 70   LDL Cholesterol 79   POCT UA - Microalbumin     Status: Normal   Collection Time: 02/19/17 10:40 AM  Result Value Ref Range   Microalbumin Ur, POC 20 mg/L   Creatinine, POC  mg/dL   Albumin/Creatinine Ratio, Urine, POC    POCT HgB A1C     Status: Abnormal   Collection Time: 02/19/17 10:42 AM  Result Value Ref Range   Hemoglobin A1C 8.1       PHQ2/9: Depression screen [facility] 2/9 09/19/2016 08/18/2016 06/27/2016 02/22/2016 01/04/2016  Decreased Interest '1 3 3 '$ 0 1  Down, Depressed, Hopeless '1 3 1 1 2  '$ PHQ - 2 Score '2 6 4 1 3  '$ Altered sleeping 0 3 1 0 3  Tired, decreased energy 0 '3 3 3 1  '$ Change in appetite 0 3 3 0 1  Feeling bad or failure about yourself  - '3 3 1 '$ 0  Trouble concentrating 0 '3 3 1 1  '$ Moving slowly or fidgety/restless 0 0 2 0 0  Suicidal thoughts 1 0 1 0 0  PHQ-9 Score '3 21 20 6 9  '$ Difficult doing work/chores Somewhat difficult Very difficult Not difficult at all - -     Fall Risk: Fall Risk  09/19/2016 08/18/2016 06/27/2016 02/22/2016 01/04/2016   Falls in the past year? No Yes Yes Yes Yes  Number falls in past  yr: - 1 2 or more 1 1  Injury with Fall? - No No Yes No  Risk Factor Category  - - High Fall Risk - -  Risk for fall due to : - - Impaired mobility - -  Follow up - - Education provided;Falls evaluation completed - -     Assessment & Plan  1. Dyslipidemia associated with type 2 diabetes mellitus ([place])  - POCT HgB A1C - POCT UA - Microalbumin  2. Charcot foot due to diabetes mellitus ([place])   3. Benign hypertension  At goal   4. Chronic recurrent major depressive disorder ([place])  - Ambulatory referral to Psychiatry  5. Other specified hypothyroidism  - TSH - DG Bone Density; Future  6. Chronic kidney disease (CKD), stage III (moderate)  - COMPLETE METABOLIC PANEL WITH GFR  7. Other insomnia  Follow up with psychiatrist  8. Obstructive sleep apnea  Discussed importance of use of CPAP  9. Encounter for screening mammogram for breast cancer  - MM Digital Screening; Future  10. Ovarian failure  - DG Bone Density; Future

## 2017-02-19 NOTE — Addendum Note (Signed)
Addended by: Lolita Rieger D on: 02/19/2017 11:23 AM   Modules accepted: Orders

## 2017-02-23 ENCOUNTER — Other Ambulatory Visit: Payer: Self-pay | Admitting: Family Medicine

## 2017-02-27 ENCOUNTER — Ambulatory Visit (INDEPENDENT_AMBULATORY_CARE_PROVIDER_SITE_OTHER): Payer: Medicare PPO | Admitting: Family Medicine

## 2017-02-27 ENCOUNTER — Encounter: Payer: Self-pay | Admitting: Family Medicine

## 2017-02-27 VITALS — BP 134/52 | HR 109 | Temp 98.2°F | Resp 22 | Wt 224.1 lb

## 2017-02-27 DIAGNOSIS — E785 Hyperlipidemia, unspecified: Secondary | ICD-10-CM

## 2017-02-27 DIAGNOSIS — J40 Bronchitis, not specified as acute or chronic: Secondary | ICD-10-CM

## 2017-02-27 DIAGNOSIS — R197 Diarrhea, unspecified: Secondary | ICD-10-CM

## 2017-02-27 DIAGNOSIS — E1169 Type 2 diabetes mellitus with other specified complication: Secondary | ICD-10-CM

## 2017-02-27 MED ORDER — BENZONATATE 200 MG PO CAPS: 200.0000 mg | ORAL_CAPSULE | Freq: Three times a day (TID) | ORAL | 0 refills | Status: DC | PRN

## 2017-02-27 MED ORDER — FLUTICASONE FUROATE-VILANTEROL 100-25 MCG/INH IN AEPB: 1.0000 | INHALATION_SPRAY | Freq: Every day | RESPIRATORY_TRACT | 0 refills | Status: DC

## 2017-02-27 MED ORDER — AZITHROMYCIN 250 MG PO TABS: ORAL_TABLET | ORAL | 0 refills | Status: DC

## 2017-02-27 NOTE — Progress Notes (Signed)
Name: Summer [name]   MRN: [id number]    DOB: 1950/12/19   Date:02/27/2017       Progress Note  Subjective  Chief Complaint  Chief Complaint  Patient presents with  . Cough    Onset-Thursday, Coughing, Wheezing, SOB, Fatigue, Ringing in Bilateral Ears. Has tried Mucinex and NyQuil with no relief    HPI  Bronchitis: she states she has been sick for the past 6 days. Symptoms started as a cold, with rhinorrhea, nasal congestion, body aches, and a mild cough. Other symptoms improved, but has a severe cough, at times dry other times productive and is causing her to gag. She is having chills, lack of appetite, fatigue. She has also noticed wheezing and SOB. She is trying otc medication without improvement of symptoms. She has DM but glucose has been controlled, this am, fasting glucose was 120. Boyfriend is not sick.    Patient Active Problem List   Diagnosis Date Noted  . Dyslipidemia associated with type 2 diabetes mellitus ([place]) 08/18/2016  . Charcot foot due to diabetes mellitus ([place]) 02/22/2016  . Hypertriglyceridemia 07/27/2015  . Acquired abduction deformity of foot 07/12/2015  . Osteoarthritis of subtalar joint 07/12/2015  . Polyneuropathy in diseases classified elsewhere ([place]) 07/12/2015  . Arthritis of foot, degenerative 07/12/2015  . Carpal tunnel syndrome 04/17/2015  . Chronic constipation 04/17/2015  . Insomnia, persistent 04/17/2015  . Chronic kidney disease (CKD), stage III (moderate) 04/17/2015  . Decreased exercise tolerance 04/17/2015  . Diabetes mellitus with polyneuropathy ([place]) 04/17/2015  . Gastro-esophageal reflux disease without esophagitis 04/17/2015  . Bursitis, trochanteric 04/17/2015  . Cephalalgia 04/17/2015  . Benign hypertension 04/17/2015  . Adult hypothyroidism 04/17/2015  . Hearing loss 04/17/2015  . Chronic recurrent major depressive disorder ([place]) 04/17/2015  . Neurogenic claudication 04/17/2015  . Obesity (BMI 35.0-39.9 without comorbidity)  04/17/2015  . Hypo-ovarianism 04/17/2015  . Perennial allergic rhinitis with seasonal variation 04/17/2015  . Acne erythematosa 04/17/2015  . Dyskinesia, tardive 04/17/2015  . Memory loss 04/17/2015  . Impingement syndrome of shoulder 04/17/2015  . Dermatitis, stasis 04/17/2015  . Obstructive sleep apnea 05/14/2014  . Dyspnea 05/06/2014  . Hyperlipidemia 02/06/2012  . LBP (low back pain) 09/16/2008    Past Surgical History:  Procedure Laterality Date  . CATARACT EXTRACTION  01/2011   right  . eye lid surgery  2013   bilateral  . NECK SURGERY    . TUBAL LIGATION    . VAGINAL HYSTERECTOMY  1989    Family History  Problem Relation Age of Onset  . Heart attack Mother   . Aneurysm Mother     Social History   Social History  . Marital status: Widowed    Spouse name: N/A  . Number of children: N/A  . Years of education: N/A   Occupational History  . Not on file.   Social History Main Topics  . Smoking status: Never Smoker  . Smokeless tobacco: Never Used  . Alcohol use 0.0 - 0.6 oz/week  . Drug use: No  . Sexual activity: Yes    Partners: Male   Other Topics Concern  . Not on file   Social History Narrative  . No narrative on file     Current Outpatient Prescriptions:  .  amitriptyline (ELAVIL) 50 MG tablet, Take 1 tablet (50 mg total) by mouth at bedtime. Take 1/2 to 1 tab by mouth at bedtime., Disp: 30 tablet, Rfl: 0 .  ASPIRIN LOW DOSE 81 MG EC tablet, TAKE 1 TABLET (  81 MG TOTAL) BY MOUTH DAILY., Disp: 30 tablet, Rfl: 0 .  Blood Glucose Monitoring Suppl (ACCU-CHEK AVIVA PLUS) w/Device KIT, , Disp: , Rfl:  .  diclofenac sodium (VOLTAREN) 1 % GEL, Apply topically as needed. Reported on 02/22/2016, Disp: , Rfl:  .  Glucose Blood (BLOOD GLUCOSE TEST STRIPS) STRP, , Disp: , Rfl:  .  Insulin Glargine (LANTUS SOLOSTAR) 100 UNIT/ML Solostar Pen, Inject 50 Units into the skin daily at 10 pm. (Patient taking differently: Inject 60 Units into the skin daily at 10 pm. ),  Disp: 15 mL, Rfl: 2 .  Insulin Pen Needle (RELION PEN NEEDLE 31G/8MM) 31G X 8 MM MISC, by Does not apply route 2 (two) times daily., Disp: , Rfl:  .  insulin regular (NOVOLIN R,HUMULIN R) 100 units/mL injection, Inject 0.1 mLs (10 Units total) into the skin 3 (three) times daily before meals. (Patient taking differently: Inject 28 Units into the skin 3 (three) times daily before meals. ), Disp: 10 mL, Rfl: 0 .  lamoTRIgine (LAMICTAL) 25 MG tablet, TAKE 2 TABLETS EVERY DAY, Disp: 180 tablet, Rfl: 1 .  levothyroxine (SYNTHROID, LEVOTHROID) 25 MCG tablet, TAKE 1 TABLET EVERY DAY  EXCEPT TAKE 2 TABLETS  ON  SUNDAYS, Disp: 100 tablet, Rfl: 0 .  losartan-hydrochlorothiazide (HYZAAR) 100-12.5 MG tablet, Take 1 tablet by mouth daily., Disp: 90 tablet, Rfl: 1 .  metFORMIN (GLUCOPHAGE-XR) 500 MG 24 hr tablet, Take 1 tablet (500 mg total) by mouth daily with breakfast., Disp: 180 tablet, Rfl: 1 .  metoprolol tartrate (LOPRESSOR) 25 MG tablet, Take 1 tablet (25 mg total) by mouth 2 (two) times daily., Disp: 180 tablet, Rfl: 1 .  montelukast (SINGULAIR) 10 MG tablet, Take 1 tablet (10 mg total) by mouth at bedtime., Disp: 90 tablet, Rfl: 1 .  omega-3 acid ethyl esters (LOVAZA) 1 g capsule, Take 2 capsules (2 g total) by mouth 2 (two) times daily., Disp: 360 capsule, Rfl: 1 .  omeprazole (PRILOSEC) 40 MG capsule, Take 1 capsule (40 mg total) by mouth daily., Disp: 90 capsule, Rfl: 1 .  pregabalin (LYRICA) 300 MG capsule, Take 2 capsules (600 mg total) by mouth daily., Disp: 180 capsule, Rfl: 1 .  ranitidine (ZANTAC) 150 MG tablet, Take 150 mg by mouth at bedtime., Disp: , Rfl:  .  RELION INSULIN SYRINGE 1ML/31G 31G X 5/16" 1 ML MISC, , Disp: , Rfl:  .  rosuvastatin (CRESTOR) 20 MG tablet, TAKE 1 TABLET DAILY. REPLACES PRAVASTATIN, Disp: 90 tablet, Rfl: 1 .  sertraline (ZOLOFT) 100 MG tablet, TAKE 1 TABLET EVERY DAY, Disp: 90 tablet, Rfl: 1 .  triamcinolone cream (KENALOG) 0.5 %, Apply 1 application topically 3  (three) times daily., Disp: 454 g, Rfl: 0 .  VITAMIN D, CHOLECALCIFEROL, PO, Take 20,000 Units by mouth daily., Disp: , Rfl:   Allergies  Allergen Reactions  . Codeine Other (See Comments)    "TRIPPED OUT"  DIDN'T LIKE THE WAY IT FELT  . Atorvastatin     muscle pain  . Latex Rash  . Zolpidem Other (See Comments)    Sleep walk     ROS  Ten systems reviewed and is negative except as mentioned in HPI , she has also noticed diarrhea over the past couple of days, no blood in stools, advised Imodium  Objective  Vitals:   02/27/17 0948  BP: (!) 134/52  Pulse: (!) 109  Resp: (!) 22  Temp: 98.2 F (36.8 C)  TempSrc: Oral  SpO2: 92%  Weight: 224  lb 1.6 oz (101.7 kg)    Body mass index is 40.99 kg/m.  Physical Exam  Constitutional: Patient appears well-developed and well-nourished. Obese  No distress.  HEENT: head atraumatic, normocephalic, pupils equal and reactive to light, ears: normal TM bilaterally neck supple, throat within normal limits Cardiovascular: Normal rate, regular rhythm and normal heart sounds.  No murmur heard. No BLE edema. Pulmonary/Chest: Effort normal , she has end inspiratory wheezing. No respiratory distress. Abdominal: Soft.  There is no tenderness. Psychiatric: Patient has a normal mood and affect. behavior is normal. Judgment and thought content normal.  Recent Results (from the past 2160 hour(s))  POCT UA - Microalbumin     Status: Normal   Collection Time: 02/19/17 10:40 AM  Result Value Ref Range   Microalbumin Ur, POC 20 mg/L   Creatinine, POC  mg/dL   Albumin/Creatinine Ratio, Urine, POC    POCT HgB A1C     Status: Abnormal   Collection Time: 02/19/17 10:42 AM  Result Value Ref Range   Hemoglobin A1C 8.1   COMPLETE METABOLIC PANEL WITH GFR     Status: Abnormal   Collection Time: 02/19/17 11:28 AM  Result Value Ref Range   Sodium 140 135 - 146 mmol/L   Potassium 4.6 3.5 - 5.3 mmol/L   Chloride 103 98 - 110 mmol/L   CO2 25 20 - 31  mmol/L   Glucose, Bld 204 (H) 65 - 99 mg/dL   BUN 23 7 - 25 mg/dL   Creat 0.95 0.50 - 0.99 mg/dL    Comment:   For patients > or = 68 years of age: The upper reference limit for Creatinine is approximately 13% higher for people identified as African-American.      Total Bilirubin 0.4 0.2 - 1.2 mg/dL   Alkaline Phosphatase 90 33 - 130 U/L   AST 18 10 - 35 U/L   ALT 20 6 - 29 U/L   Total Protein 6.3 6.1 - 8.1 g/dL   Albumin 3.9 3.6 - 5.1 g/dL   Calcium 9.5 8.6 - 10.4 mg/dL   GFR, Est African American 71 >=60 mL/min   GFR, Est Non African American 62 >=60 mL/min  TSH     Status: None   Collection Time: 02/19/17 11:28 AM  Result Value Ref Range   TSH 1.87 mIU/L    Comment:   Reference Range   > or = 20 Years  0.40-4.50   Pregnancy Range First trimester  0.26-2.66 Second trimester 0.55-2.73 Third trimester  0.43-2.91        PHQ2/9: Depression screen [facility] 2/9 09/19/2016 08/18/2016 06/27/2016 02/22/2016 01/04/2016  Decreased Interest '1 3 3 '$ 0 1  Down, Depressed, Hopeless '1 3 1 1 2  '$ PHQ - 2 Score '2 6 4 1 3  '$ Altered sleeping 0 3 1 0 3  Tired, decreased energy 0 '3 3 3 1  '$ Change in appetite 0 3 3 0 1  Feeling bad or failure about yourself  - '3 3 1 '$ 0  Trouble concentrating 0 '3 3 1 1  '$ Moving slowly or fidgety/restless 0 0 2 0 0  Suicidal thoughts 1 0 1 0 0  PHQ-9 Score '3 21 20 6 9  '$ Difficult doing work/chores Somewhat difficult Very difficult Not difficult at all - -     Fall Risk: Fall Risk  02/27/2017 09/19/2016 08/18/2016 06/27/2016 02/22/2016  Falls in the past year? No No Yes Yes Yes  Number falls in past yr: - - 1 2 or more 1  Injury with Fall? - - No No Yes  Risk Factor Category  - - - High Fall Risk -  Risk for fall due to : - - - Impaired mobility -  Follow up - - - Education provided;Falls evaluation completed -    Functional Status Survey: Is the patient deaf or have difficulty hearing?: No Does the patient have difficulty seeing, even when wearing  glasses/contacts?: No Does the patient have difficulty concentrating, remembering, or making decisions?: No Does the patient have difficulty walking or climbing stairs?: No Does the patient have difficulty dressing or bathing?: No Does the patient have difficulty doing errands alone such as visiting a doctor's office or shopping?: No    Assessment & Plan  1. Bronchitis  She is high risk and we will start antibiotics.  - benzonatate (TESSALON) 200 MG capsule; Take 1 capsule (200 mg total) by mouth 3 (three) times daily as needed.  Dispense: 40 capsule; Refill: 0 - fluticasone furoate-vilanterol (BREO ELLIPTA) 100-25 MCG/INH AEPB; Inhale 1 puff into the lungs daily.  Dispense: 60 each; Refill: 0 - azithromycin (ZITHROMAX) 250 MG tablet; Take 2 first day and one daily for 4 days  Dispense: 6 tablet; Refill: 0 -Red flags and when to present for emergency care or RTC including fever >101.56F, chest pain, shortness of breath, new/worsening/un-resolving symptoms Follow up and care instructions discussed and provided in AVS.  2. Dyslipidemia associated with type 2 diabetes mellitus ([place])  Monitor glucose more often since she has been sick   3. Diarrhea, unspecified type  Advised her to increase water in take, and may take Imodium if needed.

## 2017-03-05 ENCOUNTER — Ambulatory Visit (INDEPENDENT_AMBULATORY_CARE_PROVIDER_SITE_OTHER): Payer: Medicare PPO | Admitting: Psychiatry

## 2017-03-05 ENCOUNTER — Encounter: Payer: Self-pay | Admitting: Psychiatry

## 2017-03-05 VITALS — BP 162/81 | HR 116 | Temp 98.3°F

## 2017-03-05 DIAGNOSIS — F331 Major depressive disorder, recurrent, moderate: Secondary | ICD-10-CM

## 2017-03-05 MED ORDER — BUPROPION HCL 75 MG PO TABS: 75.0000 mg | ORAL_TABLET | Freq: Every morning | ORAL | 1 refills | Status: DC

## 2017-03-05 NOTE — Progress Notes (Signed)
Psychiatric Initial Adult Assessment   Patient Identification: Summer [name] MRN:  [id number] Date of Evaluation:  03/05/2017 Referral Source: Dr.Sowles Chief Complaint:   Chief Complaint    Establish Care; Anxiety; Depression; Stress     Visit Diagnosis:    ICD-10-CM   1. MDD (major depressive disorder), recurrent episode, moderate ([name]) F33.1     History of Present Illness:  Patient is a 66 year old Caucasian woman with multiple medical problems was referred by her primary care physician for an evaluation and management of her depression. Patient reports that she does have diabetes a high blood pressure and also some ankle problems due to a fall a few years ago. Reports that she has had been depressed and anxious for a while now and been managed by her primary care physician. She is currently taking Zoloft 100 mg and is unaware of what the Lamictal is for and was told it is mood stabilizer. Patient is currently endorsing significant depression with anhedonia, feeling somewhat hopeless and worthless. She reports poor energy and feels fatigued all the time. She does sleep okay with the help of Elavil. States that she has been overeating because of her ankle problem and depression. Having trouble concentrating and also moving. States that she lives with her partner of 23 years who is supportive. 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 She denies any psychotic symptoms. Denies problems with drug or alcohol abuse. Denies any trauma. Patient has never seen a therapist. She sighs psychiatrist once or twice in the past and states she did not like that interaction.  Associated Signs/Symptoms: Depression Symptoms:  depressed mood, anhedonia, psychomotor agitation, fatigue, feelings of worthlessness/guilt, difficulty concentrating, hopelessness, anxiety, weight gain, increased appetite, (Hypo) Manic Symptoms:  denies Anxiety Symptoms:  Excessive Worry, Psychotic Symptoms:  denies PTSD  Symptoms: Death of father was traumatic  Past Psychiatric History: Never been hospitalized psychiatrically. Denies any suicide attempts.  Previous Psychotropic Medications: Yes   Substance Abuse History in the last 12 months:  No.  Consequences of Substance Abuse: Negative  Past Medical History:  Past Medical History:  Diagnosis Date  . Asthma   . Carpal tunnel syndrome   . Chronic kidney disease   . Depressive disorder   . Diabetes mellitus   . Hyperlipidemia   . Hypertension   . Insomnia   . Lumbago   . Memory loss   . Obesity   . 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)   . Rhinitis, allergic   . Rosacea   . Thyroid disease   . Unspecified hearing loss   . Unspecified hereditary and idiopathic peripheral neuropathy   . Unspecified sleep apnea     Past Surgical History:  Procedure Laterality Date  . CATARACT EXTRACTION  01/2011   right  . eye lid surgery  2013   bilateral  . FOOT SURGERY    . NECK SURGERY    . TUBAL LIGATION    . VAGINAL HYSTERECTOMY  1989    Family Psychiatric History:   Family History:  Family History  Problem Relation Age of Onset  . Heart attack Mother   . Aneurysm Mother     Social History:   Social History   Social History  . Marital status: Widowed    Spouse name: N/A  . Number of children: N/A  . Years of education: N/A   Social History Main Topics  . Smoking status: Never Smoker  . Smokeless tobacco: Never Used  . Alcohol use No  . Drug  use: No  . Sexual activity: Yes    Partners: Male   Other Topics Concern  . None   Social History Narrative  . None    Additional Social History: Lives with her boyfriend of 23 years.  Allergies:   Allergies  Allergen Reactions  . Codeine Other (See Comments)    "TRIPPED OUT"  DIDN'T LIKE THE [place]  . Atorvastatin     muscle pain  . Latex Rash  . Zolpidem Other (See Comments)    Sleep walk    Metabolic Disorder Labs: Lab Results  Component Value Date   HGBA1C 8.1  02/19/2017   No results found for: PROLACTIN Lab Results  Component Value Date   CHOL 198 11/28/2016   TRIG 386 (A) 11/28/2016   HDL 42 11/28/2016   CHOLHDL 5.5 (H) 08/18/2016   VLDL 48 (H) 08/18/2016   LDLCALC 79 11/28/2016   LDLCALC 122 (H) 08/18/2016     Current Medications: Current Outpatient Prescriptions  Medication Sig Dispense Refill  . amitriptyline (ELAVIL) 50 MG tablet Take 1 tablet (50 mg total) by mouth at bedtime. Take 1/2 to 1 tab by mouth at bedtime. 30 tablet 0  . ASPIRIN LOW DOSE 81 MG EC tablet TAKE 1 TABLET (81 MG TOTAL) BY MOUTH DAILY. 30 tablet 0  . azithromycin (ZITHROMAX) 250 MG tablet Take 2 first day and one daily for 4 days 6 tablet 0  . benzonatate (TESSALON) 200 MG capsule Take 1 capsule (200 mg total) by mouth 3 (three) times daily as needed. 40 capsule 0  . Blood Glucose Monitoring Suppl (ACCU-CHEK AVIVA PLUS) w/Device KIT     . diclofenac sodium (VOLTAREN) 1 % GEL Apply topically as needed. Reported on 02/22/2016    . fluticasone furoate-vilanterol (BREO ELLIPTA) 100-25 MCG/INH AEPB Inhale 1 puff into the lungs daily. 60 each 0  . Glucose Blood (BLOOD GLUCOSE TEST STRIPS) STRP     . Insulin Glargine (LANTUS SOLOSTAR) 100 UNIT/ML Solostar Pen Inject 50 Units into the skin daily at 10 pm. (Patient taking differently: Inject 60 Units into the skin daily at 10 pm. ) 15 mL 2  . Insulin Pen Needle (RELION PEN NEEDLE 31G/8MM) 31G X 8 MM MISC by Does not apply route 2 (two) times daily.    . insulin regular (NOVOLIN R,HUMULIN R) 100 units/mL injection Inject 0.1 mLs (10 Units total) into the skin 3 (three) times daily before meals. (Patient taking differently: Inject 28 Units into the skin 3 (three) times daily before meals. ) 10 mL 0  . lamoTRIgine (LAMICTAL) 25 MG tablet TAKE 2 TABLETS EVERY DAY 180 tablet 1  . levothyroxine (SYNTHROID, LEVOTHROID) 25 MCG tablet TAKE 1 TABLET EVERY DAY  EXCEPT TAKE 2 TABLETS  ON  SUNDAYS 100 tablet 0  .  losartan-hydrochlorothiazide (HYZAAR) 100-12.5 MG tablet Take 1 tablet by mouth daily. 90 tablet 1  . metFORMIN (GLUCOPHAGE-XR) 500 MG 24 hr tablet Take 1 tablet (500 mg total) by mouth daily with breakfast. 180 tablet 1  . metoprolol tartrate (LOPRESSOR) 25 MG tablet Take 1 tablet (25 mg total) by mouth 2 (two) times daily. 180 tablet 1  . omega-3 acid ethyl esters (LOVAZA) 1 g capsule Take 2 capsules (2 g total) by mouth 2 (two) times daily. 360 capsule 1  . omeprazole (PRILOSEC) 40 MG capsule Take 1 capsule (40 mg total) by mouth daily. 90 capsule 1  . pregabalin (LYRICA) 300 MG capsule Take 2 capsules (600 mg total) by mouth daily.  180 capsule 1  . ranitidine (ZANTAC) 150 MG tablet Take 150 mg by mouth at bedtime.    Marland Kitchen RELION INSULIN SYRINGE 1ML/31G 31G X 5/16" 1 ML MISC     . rosuvastatin (CRESTOR) 20 MG tablet TAKE 1 TABLET DAILY. REPLACES PRAVASTATIN 90 tablet 1  . sertraline (ZOLOFT) 100 MG tablet TAKE 1 TABLET EVERY DAY 90 tablet 1  . triamcinolone cream (KENALOG) 0.5 % Apply 1 application topically 3 (three) times daily. 454 g 0  . VITAMIN D, CHOLECALCIFEROL, PO Take 20,000 Units by mouth daily.    Marland Kitchen buPROPion (WELLBUTRIN) 75 MG tablet Take 1 tablet (75 mg total) by mouth every morning. 30 tablet 1  . montelukast (SINGULAIR) 10 MG tablet Take 1 tablet (10 mg total) by mouth at bedtime. (Patient not taking: Reported on 03/05/2017) 90 tablet 1   No current facility-administered medications for this visit.     Neurologic: Headache: No Seizure: No Paresthesias:No  Musculoskeletal: Strength & Muscle Tone: within normal limits Gait & Station: broad based Patient leans: Front  Psychiatric Specialty Exam: ROS  Blood pressure (!) 162/81, pulse (!) 116, temperature 98.3 F (36.8 C), temperature source Oral.There is no height or weight on file to calculate BMI.  General Appearance: Casual  Eye Contact:  Fair  Speech:  Clear and Coherent  Volume:  Normal  Mood:  Anxious and Dysphoric   Affect:  Congruent  Thought Process:  Coherent  Orientation:  Full (Time, Place, and Person)  Thought Content:  Logical  Suicidal Thoughts:  No  Homicidal Thoughts:  No  Memory:  Immediate;   Fair Recent;   Fair Remote;   Fair  Judgement:  Fair  Insight:  Fair  Psychomotor Activity:  Normal  Concentration:  Concentration: Fair and Attention Span: Fair  Recall:  [employer] of Knowledge:Fair  Language: Fair  Akathisia:  No  Handed:  Right  AIMS (if indicated):  na  Assets:  Communication Skills Desire for Improvement Housing Social Support Vocational/Educational  ADL's:  Intact  Cognition: WNL  Sleep:  ok    Treatment Plan Summary:  Major Depressive disorder moderate  Start wellbutrin at 67m po qd.Side effects and benefits discussed. Decrease Zoloft to 573monce daily. Continue lamictal at 2558mwice daily.  Return to clinic in 2 weeks or call before if necessary.    RAVElvin SoD 6/25/201811:28 AM

## 2017-03-08 DIAGNOSIS — F3340 Major depressive disorder, recurrent, in remission, unspecified: Secondary | ICD-10-CM | POA: Diagnosis not present

## 2017-03-08 DIAGNOSIS — E1165 Type 2 diabetes mellitus with hyperglycemia: Secondary | ICD-10-CM | POA: Diagnosis not present

## 2017-03-08 DIAGNOSIS — E785 Hyperlipidemia, unspecified: Secondary | ICD-10-CM | POA: Diagnosis not present

## 2017-03-08 DIAGNOSIS — G4700 Insomnia, unspecified: Secondary | ICD-10-CM | POA: Diagnosis not present

## 2017-03-08 DIAGNOSIS — K219 Gastro-esophageal reflux disease without esophagitis: Secondary | ICD-10-CM | POA: Diagnosis not present

## 2017-03-08 DIAGNOSIS — I10 Essential (primary) hypertension: Secondary | ICD-10-CM | POA: Diagnosis not present

## 2017-03-08 DIAGNOSIS — E039 Hypothyroidism, unspecified: Secondary | ICD-10-CM | POA: Diagnosis not present

## 2017-03-08 DIAGNOSIS — E1122 Type 2 diabetes mellitus with diabetic chronic kidney disease: Secondary | ICD-10-CM | POA: Diagnosis not present

## 2017-03-09 DIAGNOSIS — E11610 Type 2 diabetes mellitus with diabetic neuropathic arthropathy: Secondary | ICD-10-CM | POA: Diagnosis not present

## 2017-03-09 DIAGNOSIS — M1460 Charcot's joint, unspecified site: Secondary | ICD-10-CM | POA: Diagnosis not present

## 2017-03-12 DIAGNOSIS — I10 Essential (primary) hypertension: Secondary | ICD-10-CM | POA: Diagnosis not present

## 2017-03-12 DIAGNOSIS — M1460 Charcot's joint, unspecified site: Secondary | ICD-10-CM | POA: Diagnosis not present

## 2017-03-12 DIAGNOSIS — M069 Rheumatoid arthritis, unspecified: Secondary | ICD-10-CM | POA: Diagnosis not present

## 2017-03-12 DIAGNOSIS — Z4789 Encounter for other orthopedic aftercare: Secondary | ICD-10-CM | POA: Diagnosis not present

## 2017-03-22 ENCOUNTER — Ambulatory Visit (INDEPENDENT_AMBULATORY_CARE_PROVIDER_SITE_OTHER): Payer: Medicare PPO | Admitting: Psychiatry

## 2017-03-22 ENCOUNTER — Encounter: Payer: Self-pay | Admitting: Psychiatry

## 2017-03-22 VITALS — BP 181/81 | HR 77 | Temp 98.4°F

## 2017-03-22 DIAGNOSIS — F331 Major depressive disorder, recurrent, moderate: Secondary | ICD-10-CM

## 2017-03-22 MED ORDER — BUPROPION HCL ER (XL) 150 MG PO TB24: 150.0000 mg | ORAL_TABLET | ORAL | 1 refills | Status: DC

## 2017-03-22 NOTE — Progress Notes (Signed)
Psychiatric progress note  Patient Identification: Summer [name] MRN:  [id number] Date of Evaluation:  03/22/2017 Referral Source: Dr.Sowles Chief Complaint:   Chief Complaint    Follow-up; Medication Refill     Visit Diagnosis:    ICD-10-CM   1. MDD (major depressive disorder), recurrent episode, moderate ([name]) F33.1     History of Present Illness:  Patient is a 66 year old Caucasian woman with multiple medical problems was referred by her primary care physician for a follow up and management of her depression. Reports that she has tolerated the wellbutrin well, has not noticed a change in her mood. Decreased Zoloft without any problems. Sleeping better. States she has been eating more. Continues to be irritable and not happy. Denies any suicidal thoughts.  Past Psychiatric History: Never been hospitalized psychiatrically. Denies any suicide attempts.  Previous Psychotropic Medications: Yes   Substance Abuse History in the last 12 months:  No.  Consequences of Substance Abuse: Negative  Past Medical History:  Past Medical History:  Diagnosis Date  . Asthma   . Carpal tunnel syndrome   . Chronic kidney disease   . Depressive disorder   . Diabetes mellitus   . Hyperlipidemia   . Hypertension   . Insomnia   . Lumbago   . Memory loss   . Obesity   . 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)   . Rhinitis, allergic   . Rosacea   . Thyroid disease   . Unspecified hearing loss   . Unspecified hereditary and idiopathic peripheral neuropathy   . Unspecified sleep apnea     Past Surgical History:  Procedure Laterality Date  . CATARACT EXTRACTION  01/2011   right  . eye lid surgery  2013   bilateral  . FOOT SURGERY    . NECK SURGERY    . TUBAL LIGATION    . VAGINAL HYSTERECTOMY  1989    Family Psychiatric History:   Family History:  Family History  Problem Relation Age of Onset  . Heart attack Mother   . Aneurysm Mother     Social History:   Social History   Social  History  . Marital status: Widowed    Spouse name: N/A  . Number of children: N/A  . Years of education: N/A   Social History Main Topics  . Smoking status: Never Smoker  . Smokeless tobacco: Never Used  . Alcohol use No  . Drug use: No  . Sexual activity: Yes    Partners: Male   Other Topics Concern  . None   Social History Narrative  . None    Additional Social History: Lives with her boyfriend of 23 years.  Allergies:   Allergies  Allergen Reactions  . Codeine Other (See Comments)    "TRIPPED OUT"  DIDN'T LIKE THE [place]  . Atorvastatin     muscle pain  . Latex Rash  . Zolpidem Other (See Comments)    Sleep walk    Metabolic Disorder Labs: Lab Results  Component Value Date   HGBA1C 8.1 02/19/2017   No results found for: PROLACTIN Lab Results  Component Value Date   CHOL 198 11/28/2016   TRIG 386 (A) 11/28/2016   HDL 42 11/28/2016   CHOLHDL 5.5 (H) 08/18/2016   VLDL 48 (H) 08/18/2016   LDLCALC 79 11/28/2016   LDLCALC 122 (H) 08/18/2016     Current Medications: Current Outpatient Prescriptions  Medication Sig Dispense Refill  . amitriptyline (ELAVIL) 50 MG tablet Take 1 tablet (50 mg total)  by mouth at bedtime. Take 1/2 to 1 tab by mouth at bedtime. 30 tablet 0  . ASPIRIN LOW DOSE 81 MG EC tablet TAKE 1 TABLET (81 MG TOTAL) BY MOUTH DAILY. 30 tablet 0  . azithromycin (ZITHROMAX) 250 MG tablet Take 2 first day and one daily for 4 days 6 tablet 0  . benzonatate (TESSALON) 200 MG capsule Take 1 capsule (200 mg total) by mouth 3 (three) times daily as needed. 40 capsule 0  . Blood Glucose Monitoring Suppl (ACCU-CHEK AVIVA PLUS) w/Device KIT     . buPROPion (WELLBUTRIN) 75 MG tablet Take 1 tablet (75 mg total) by mouth every morning. 30 tablet 1  . diclofenac sodium (VOLTAREN) 1 % GEL Apply topically as needed. Reported on 02/22/2016    . fluticasone furoate-vilanterol (BREO ELLIPTA) 100-25 MCG/INH AEPB Inhale 1 puff into the lungs daily. 60 each 0  .  fluticasone furoate-vilanterol (BREO ELLIPTA) 100-25 MCG/INH AEPB Inhale into the lungs.    . Glucose Blood (BLOOD GLUCOSE TEST STRIPS) STRP     . Insulin Glargine (LANTUS SOLOSTAR) 100 UNIT/ML Solostar Pen Inject 50 Units into the skin daily at 10 pm. (Patient taking differently: Inject 60 Units into the skin daily at 10 pm. ) 15 mL 2  . Insulin Pen Needle (RELION PEN NEEDLE 31G/8MM) 31G X 8 MM MISC by Does not apply route 2 (two) times daily.    . insulin regular (NOVOLIN R,HUMULIN R) 100 units/mL injection Inject 0.1 mLs (10 Units total) into the skin 3 (three) times daily before meals. (Patient taking differently: Inject 28 Units into the skin 3 (three) times daily before meals. ) 10 mL 0  . lamoTRIgine (LAMICTAL) 25 MG tablet TAKE 2 TABLETS EVERY DAY 180 tablet 1  . levothyroxine (SYNTHROID, LEVOTHROID) 25 MCG tablet TAKE 1 TABLET EVERY DAY  EXCEPT TAKE 2 TABLETS  ON  SUNDAYS 100 tablet 0  . losartan-hydrochlorothiazide (HYZAAR) 100-12.5 MG tablet Take 1 tablet by mouth daily. 90 tablet 1  . metFORMIN (GLUCOPHAGE-XR) 500 MG 24 hr tablet Take 1 tablet (500 mg total) by mouth daily with breakfast. 180 tablet 1  . metoprolol tartrate (LOPRESSOR) 25 MG tablet Take 1 tablet (25 mg total) by mouth 2 (two) times daily. 180 tablet 1  . montelukast (SINGULAIR) 10 MG tablet Take 1 tablet (10 mg total) by mouth at bedtime. 90 tablet 1  . omega-3 acid ethyl esters (LOVAZA) 1 g capsule Take 2 capsules (2 g total) by mouth 2 (two) times daily. 360 capsule 1  . omeprazole (PRILOSEC) 40 MG capsule Take 1 capsule (40 mg total) by mouth daily. 90 capsule 1  . pregabalin (LYRICA) 300 MG capsule Take 2 capsules (600 mg total) by mouth daily. 180 capsule 1  . ranitidine (ZANTAC) 150 MG tablet Take 150 mg by mouth at bedtime.    Marland Kitchen RELION INSULIN SYRINGE 1ML/31G 31G X 5/16" 1 ML MISC     . rosuvastatin (CRESTOR) 20 MG tablet TAKE 1 TABLET DAILY. REPLACES PRAVASTATIN 90 tablet 1  . sertraline (ZOLOFT) 100 MG tablet  TAKE 1 TABLET EVERY DAY 90 tablet 1  . triamcinolone cream (KENALOG) 0.5 % Apply 1 application topically 3 (three) times daily. 454 g 0  . VITAMIN D, CHOLECALCIFEROL, PO Take 20,000 Units by mouth daily.     No current facility-administered medications for this visit.     Neurologic: Headache: No Seizure: No Paresthesias:No  Musculoskeletal: Strength & Muscle Tone: within normal limits Gait & Station: broad based Patient leans:  Front  Psychiatric Specialty Exam: ROS  Blood pressure (!) 181/81, pulse 77, temperature 98.4 F (36.9 C), temperature source Oral.There is no height or weight on file to calculate BMI.  General Appearance: Casual  Eye Contact:  Fair  Speech:  Clear and Coherent  Volume:  Normal  Mood:  depressed  Affect:  Congruent  Thought Process:  Coherent  Orientation:  Full (Time, Place, and Person)  Thought Content:  Logical  Suicidal Thoughts:  No  Homicidal Thoughts:  No  Memory:  Immediate;   Fair Recent;   Fair Remote;   Fair  Judgement:  Fair  Insight:  Fair  Psychomotor Activity:  Normal  Concentration:  Concentration: Fair and Attention Span: Fair  Recall:  [employer] of Knowledge:Fair  Language: Fair  Akathisia:  No  Handed:  Right  AIMS (if indicated):  na  Assets:  Communication Skills Desire for Improvement Housing Social Support Vocational/Educational  ADL's:  Intact  Cognition: WNL  Sleep:  ok    Treatment Plan Summary:  Major Depressive disorder moderate  Increase wellbutrin at '150mg'$  po qd.Side effects and benefits discussed. Discontinue Zoloft to '50mg'$  once daily. Discontinue lamictal at '25mg'$  twice daily. Patient given a list of therapists to start therapy. Discussed that given her diabetes as well she will need to see someone to figure out some strategies.  Return to clinic in 4 weeks or call before if necessary.    Elvin So, MD 7/12/201810:15 AM

## 2017-04-05 ENCOUNTER — Encounter: Payer: Self-pay | Admitting: Podiatry

## 2017-04-05 ENCOUNTER — Ambulatory Visit (INDEPENDENT_AMBULATORY_CARE_PROVIDER_SITE_OTHER): Payer: Medicare PPO | Admitting: Podiatry

## 2017-04-05 DIAGNOSIS — M19072 Primary osteoarthritis, left ankle and foot: Secondary | ICD-10-CM

## 2017-04-05 DIAGNOSIS — E11610 Type 2 diabetes mellitus with diabetic neuropathic arthropathy: Secondary | ICD-10-CM | POA: Diagnosis not present

## 2017-04-05 NOTE — Progress Notes (Addendum)
This patient presents the office following foot surgery for the correction of a Charcot foot left foot.  Surgery  performed by Dr. Prudy Feeler and she was seen in this office by Dr. Jacqualyn Posey.  She presents the office wearing a Cam Walker-like boot saying she's having no pain or discomfort in her left foot.  She presents the office today stating she wants to acquire a pair of diabetic shoes.     GENERAL APPEARANCE: Alert, conversant. Appropriately groomed. No acute distress.  VASCULAR: Pedal pulses are  palpable at  [facility] and PT bilateral.  Capillary refill time is immediate to all digits,  Normal temperature gradient.   NEUROLOGIC: sensation is diminished  to 5.07 monofilament at 5/5 sites bilateral.  Light touch is intact bilateral, Muscle strength normal.  MUSCULOSKELETAL: acceptable muscle strength, tone and stability bilateral.  Intrinsic muscluature intact bilateral.  Rectus appearance of foot and digits noted bilateral.  Unilateral pes planus secondary to Charcot foot left foot.  Significant prominence noted at the medial aspect of the rear foot  DERMATOLOGIC: skin color, texture, and turgor are within normal limits.  No preulcerative lesions or ulcers  are seen, no interdigital maceration noted.  No open lesions present.  Digital nails are asymptomatic. No drainage noted.  Pes planus left  S/p foot surgery left foot   ROV  . Discussed diabetic shoes with this patient.  I believe it would be better for her to be seen by Liliane Channel  than to have me cast her for shoes at the visit today.  Therefore, she is to make an appointment with Liliane Channel to be casted  for diabetic shoes   Gardiner Barefoot DPM

## 2017-04-08 DIAGNOSIS — M1460 Charcot's joint, unspecified site: Secondary | ICD-10-CM | POA: Diagnosis not present

## 2017-04-08 DIAGNOSIS — E11610 Type 2 diabetes mellitus with diabetic neuropathic arthropathy: Secondary | ICD-10-CM | POA: Diagnosis not present

## 2017-04-10 ENCOUNTER — Other Ambulatory Visit: Payer: Self-pay

## 2017-04-18 ENCOUNTER — Ambulatory Visit (INDEPENDENT_AMBULATORY_CARE_PROVIDER_SITE_OTHER): Payer: Medicare PPO | Admitting: Orthotics

## 2017-04-18 ENCOUNTER — Telehealth: Payer: Self-pay | Admitting: Family Medicine

## 2017-04-18 DIAGNOSIS — Z872 Personal history of diseases of the skin and subcutaneous tissue: Secondary | ICD-10-CM

## 2017-04-18 DIAGNOSIS — E11610 Type 2 diabetes mellitus with diabetic neuropathic arthropathy: Secondary | ICD-10-CM

## 2017-04-18 NOTE — Progress Notes (Signed)
Patient presents with DM2, PN, and significant Charcot deformity left.   OTS diabetic shoes would be of little use to patient due to the charcot.   Patient was advised that the most appropriate footwear would be custom diabetic shoes ([id number]).  Paperwork with [employer] initiated and Mcarthur Rossetti will also be given information for insurance verification.

## 2017-04-18 NOTE — Telephone Encounter (Signed)
Pt dropped of forms for diabetic shoes from [employer]. She will not be seeing her diabetic doctor until October and would like to know if you would order her shoes. Paperwork was given to [employer]

## 2017-04-20 ENCOUNTER — Other Ambulatory Visit: Payer: Self-pay | Admitting: Family Medicine

## 2017-04-20 DIAGNOSIS — G4709 Other insomnia: Secondary | ICD-10-CM

## 2017-04-20 DIAGNOSIS — G44229 Chronic tension-type headache, not intractable: Secondary | ICD-10-CM

## 2017-04-20 DIAGNOSIS — K219 Gastro-esophageal reflux disease without esophagitis: Secondary | ICD-10-CM

## 2017-04-26 ENCOUNTER — Inpatient Hospital Stay: Admission: RE | Admit: 2017-04-26 | Payer: Self-pay | Source: Ambulatory Visit

## 2017-04-26 DIAGNOSIS — Z794 Long term (current) use of insulin: Secondary | ICD-10-CM | POA: Diagnosis not present

## 2017-04-26 DIAGNOSIS — E1165 Type 2 diabetes mellitus with hyperglycemia: Secondary | ICD-10-CM | POA: Diagnosis not present

## 2017-04-26 DIAGNOSIS — E118 Type 2 diabetes mellitus with unspecified complications: Secondary | ICD-10-CM | POA: Diagnosis not present

## 2017-05-03 ENCOUNTER — Ambulatory Visit: Payer: Medicare PPO | Admitting: Psychiatry

## 2017-05-09 DIAGNOSIS — E11610 Type 2 diabetes mellitus with diabetic neuropathic arthropathy: Secondary | ICD-10-CM | POA: Diagnosis not present

## 2017-05-09 DIAGNOSIS — M1460 Charcot's joint, unspecified site: Secondary | ICD-10-CM | POA: Diagnosis not present

## 2017-05-16 ENCOUNTER — Ambulatory Visit (INDEPENDENT_AMBULATORY_CARE_PROVIDER_SITE_OTHER): Payer: Medicare PPO | Admitting: Podiatry

## 2017-05-16 ENCOUNTER — Other Ambulatory Visit: Payer: Self-pay | Admitting: Family Medicine

## 2017-05-16 DIAGNOSIS — M19072 Primary osteoarthritis, left ankle and foot: Secondary | ICD-10-CM

## 2017-05-16 DIAGNOSIS — E1142 Type 2 diabetes mellitus with diabetic polyneuropathy: Secondary | ICD-10-CM

## 2017-05-16 DIAGNOSIS — G44229 Chronic tension-type headache, not intractable: Secondary | ICD-10-CM

## 2017-05-16 DIAGNOSIS — E11610 Type 2 diabetes mellitus with diabetic neuropathic arthropathy: Secondary | ICD-10-CM

## 2017-05-16 DIAGNOSIS — G4709 Other insomnia: Secondary | ICD-10-CM

## 2017-05-16 NOTE — Telephone Encounter (Signed)
Patient requesting refill of Elavil and Singulair to [facility].

## 2017-05-16 NOTE — Progress Notes (Addendum)
Patient presents today for evaluation/casting diabetic shoes and inserts.  Patient has hx of DM2, significant poly neuropathy, and DM2 Charcot.  Plus OA subtalar left.   E11.42, M19.072, E11.610    Patient has significant ankle instability due to the charcot left and has requested a firm brace to help control endema, protect foot, and offer ankle frontal plane stability.   Patient was cast for [place] brace and will be sent to Dr Prudence Davidson for approval prior to submitting request to [facility].   Gardiner Barefoot DPM

## 2017-05-18 ENCOUNTER — Ambulatory Visit (INDEPENDENT_AMBULATORY_CARE_PROVIDER_SITE_OTHER): Payer: Medicare PPO | Admitting: Family Medicine

## 2017-05-18 ENCOUNTER — Encounter: Payer: Self-pay | Admitting: Family Medicine

## 2017-05-18 VITALS — BP 134/82 | HR 94 | Temp 98.7°F | Resp 16 | Wt 220.3 lb

## 2017-05-18 DIAGNOSIS — E1169 Type 2 diabetes mellitus with other specified complication: Secondary | ICD-10-CM

## 2017-05-18 DIAGNOSIS — E785 Hyperlipidemia, unspecified: Secondary | ICD-10-CM | POA: Diagnosis not present

## 2017-05-18 DIAGNOSIS — R05 Cough: Secondary | ICD-10-CM | POA: Diagnosis not present

## 2017-05-18 DIAGNOSIS — K529 Noninfective gastroenteritis and colitis, unspecified: Secondary | ICD-10-CM

## 2017-05-18 DIAGNOSIS — J4541 Moderate persistent asthma with (acute) exacerbation: Secondary | ICD-10-CM

## 2017-05-18 DIAGNOSIS — R059 Cough, unspecified: Secondary | ICD-10-CM

## 2017-05-18 LAB — GLUCOSE, POCT (MANUAL RESULT ENTRY): POC Glucose: 254 mg/dl — AB (ref 70–99)

## 2017-05-18 MED ORDER — PROMETHAZINE HCL 12.5 MG PO TABS: 12.5000 mg | ORAL_TABLET | Freq: Three times a day (TID) | ORAL | 0 refills | Status: DC | PRN

## 2017-05-18 MED ORDER — BENZONATATE 200 MG PO CAPS: 200.0000 mg | ORAL_CAPSULE | Freq: Three times a day (TID) | ORAL | 0 refills | Status: DC | PRN

## 2017-05-18 NOTE — Progress Notes (Signed)
Name: Summer [name]   MRN: [id number]    DOB: 23-Oct-1950   Date:05/18/2017       Progress Note  Subjective  Chief Complaint  Chief Complaint  Patient presents with  . GI Problem    nausea and vomiting, started yesterday    HPI  Asthma moderate with exacerbation: she states that she has a chronic cough, but worse over the past two weeks, worse at night, it is dry and associated with wheezing, but no SOB. She has been taking asthma medication but not any otc medication at this time  Gastroenteritis: she developed acute onset of nausea and vomiting about 24 hours ago followed by diarrhea. No blood on stools or mucus. She had multiple episodes yesterday. She has been afebrile. Appetite is poor, but no abdominal pain except a cramp prior to bowel movements. Last episode of vomiting was midnight. Able to sleep all night. She has DM and glucose has been elevated even though she has not been eating. She uses Lantus last night, and took oral medications, she also use 34 units of pre-meal insulin but she did not eat. Explained that she cannot use pre-meal insulin if she does not eat. We will check glucose in our office today and advised her to contact Endo    Patient Active Problem List   Diagnosis Date Noted  . Chest pain of unknown etiology 10/06/2016  . Essential hypertension 10/06/2016  . Morbid obesity with BMI of 40.0-44.9, adult ([name]) 10/06/2016  . Dyslipidemia associated with type 2 diabetes mellitus ([name]) 08/18/2016  . Charcot foot due to diabetes mellitus ([name]) 02/22/2016  . Hypertriglyceridemia 07/27/2015  . Acquired abduction deformity of foot 07/12/2015  . Osteoarthritis of subtalar joint 07/12/2015  . Polyneuropathy in diseases classified elsewhere ([name]) 07/12/2015  . Arthritis of foot, degenerative 07/12/2015  . Carpal tunnel syndrome 04/17/2015  . Chronic constipation 04/17/2015  . Insomnia, persistent 04/17/2015  . Chronic kidney disease (CKD), stage III (moderate) 04/17/2015  .  Decreased exercise tolerance 04/17/2015  . Diabetes mellitus with polyneuropathy ([name]) 04/17/2015  . Gastro-esophageal reflux disease without esophagitis 04/17/2015  . Bursitis, trochanteric 04/17/2015  . Cephalalgia 04/17/2015  . Benign hypertension 04/17/2015  . Adult hypothyroidism 04/17/2015  . Hearing loss 04/17/2015  . Chronic recurrent major depressive disorder ([name]) 04/17/2015  . Neurogenic claudication 04/17/2015  . Obesity (BMI 35.0-39.9 without comorbidity) 04/17/2015  . Hypo-ovarianism 04/17/2015  . Perennial allergic rhinitis with seasonal variation 04/17/2015  . Acne erythematosa 04/17/2015  . Dyskinesia, tardive 04/17/2015  . Memory loss 04/17/2015  . Impingement syndrome of shoulder 04/17/2015  . Dermatitis, stasis 04/17/2015  . Obstructive sleep apnea 05/14/2014  . Dyspnea 05/06/2014  . Hyperlipidemia 02/06/2012  . LBP (low back pain) 09/16/2008    Past Surgical History:  Procedure Laterality Date  . CATARACT EXTRACTION  01/2011   right  . eye lid surgery  2013   bilateral  . FOOT SURGERY    . NECK SURGERY    . TUBAL LIGATION    . VAGINAL HYSTERECTOMY  1989    Family History  Problem Relation Age of Onset  . Heart attack Mother   . Aneurysm Mother     Social History   Social History  . Marital status: Widowed    Spouse name: N/A  . Number of children: N/A  . Years of education: N/A   Occupational History  . Not on file.   Social History Main Topics  . Smoking status: Never Smoker  . Smokeless tobacco: Never  Used  . Alcohol use No  . Drug use: No  . Sexual activity: Yes    Partners: Male   Other Topics Concern  . Not on file   Social History Narrative  . No narrative on file     Current Outpatient Prescriptions:  .  amitriptyline (ELAVIL) 50 MG tablet, TAKE 1/2 TO 1 TABLET AT BEDTIME (Patient taking differently: whole pill), Disp: 30 tablet, Rfl: 0 .  ASPIRIN LOW DOSE 81 MG EC tablet, TAKE 1 TABLET (81 MG TOTAL) BY MOUTH DAILY.,  Disp: 30 tablet, Rfl: 0 .  buPROPion (WELLBUTRIN XL) 150 MG 24 hr tablet, Take 1 tablet (150 mg total) by mouth every morning., Disp: 30 tablet, Rfl: 1 .  diclofenac sodium (VOLTAREN) 1 % GEL, Apply topically as needed. Reported on 02/22/2016, Disp: , Rfl:  .  fluticasone furoate-vilanterol (BREO ELLIPTA) 100-25 MCG/INH AEPB, Inhale 1 puff into the lungs daily., Disp: 60 each, Rfl: 0 .  Insulin Glargine (LANTUS SOLOSTAR) 100 UNIT/ML Solostar Pen, Inject 50 Units into the skin daily at 10 pm. (Patient taking differently: Inject 60 Units into the skin daily at 10 pm. ), Disp: 15 mL, Rfl: 2 .  insulin regular (NOVOLIN R,HUMULIN R) 100 units/mL injection, Inject 0.1 mLs (10 Units total) into the skin 3 (three) times daily before meals. (Patient taking differently: Inject 28 Units into the skin 3 (three) times daily before meals. ), Disp: 10 mL, Rfl: 0 .  levothyroxine (SYNTHROID, LEVOTHROID) 25 MCG tablet, TAKE 1 TABLET EVERY DAY  EXCEPT TAKE 2 TABLETS  ON  SUNDAYS, Disp: 100 tablet, Rfl: 0 .  losartan-hydrochlorothiazide (HYZAAR) 100-12.5 MG tablet, Take 1 tablet by mouth daily., Disp: 90 tablet, Rfl: 1 .  metFORMIN (GLUCOPHAGE-XR) 500 MG 24 hr tablet, Take 1 tablet (500 mg total) by mouth daily with breakfast., Disp: 180 tablet, Rfl: 1 .  metoprolol tartrate (LOPRESSOR) 25 MG tablet, Take 1 tablet (25 mg total) by mouth 2 (two) times daily., Disp: 180 tablet, Rfl: 1 .  omega-3 acid ethyl esters (LOVAZA) 1 g capsule, Take 2 capsules (2 g total) by mouth 2 (two) times daily., Disp: 360 capsule, Rfl: 1 .  omeprazole (PRILOSEC) 40 MG capsule, TAKE 1 CAPSULE (40 MG TOTAL) BY MOUTH DAILY., Disp: 90 capsule, Rfl: 1 .  pregabalin (LYRICA) 300 MG capsule, Take 2 capsules (600 mg total) by mouth daily., Disp: 180 capsule, Rfl: 1 .  ranitidine (ZANTAC) 150 MG tablet, Take 150 mg by mouth at bedtime., Disp: , Rfl:  .  rosuvastatin (CRESTOR) 20 MG tablet, TAKE 1 TABLET DAILY. REPLACES PRAVASTATIN, Disp: 90 tablet,  Rfl: 1 .  VITAMIN D, CHOLECALCIFEROL, PO, Take 20,000 Units by mouth daily., Disp: , Rfl:  .  azithromycin (ZITHROMAX) 250 MG tablet, Take 2 first day and one daily for 4 days (Patient not taking: Reported on 05/18/2017), Disp: 6 tablet, Rfl: 0 .  benzonatate (TESSALON) 200 MG capsule, Take 1 capsule (200 mg total) by mouth 3 (three) times daily as needed. (Patient not taking: Reported on 05/18/2017), Disp: 40 capsule, Rfl: 0 .  Blood Glucose Monitoring Suppl (ACCU-CHEK AVIVA PLUS) w/Device KIT, , Disp: , Rfl:  .  fluticasone furoate-vilanterol (BREO ELLIPTA) 100-25 MCG/INH AEPB, Inhale into the lungs., Disp: , Rfl:  .  Glucose Blood (BLOOD GLUCOSE TEST STRIPS) STRP, , Disp: , Rfl:  .  Insulin Pen Needle (RELION PEN NEEDLE 31G/8MM) 31G X 8 MM MISC, by Does not apply route 2 (two) times daily., Disp: , Rfl:  .  montelukast (SINGULAIR) 10 MG tablet, TAKE 1 TABLET AT BEDTIME (Patient not taking: Reported on 05/18/2017), Disp: 90 tablet, Rfl: 1 .  RELION INSULIN SYRINGE 1ML/31G 31G X 5/16" 1 ML MISC, , Disp: , Rfl:  .  triamcinolone cream (KENALOG) 0.5 %, Apply 1 application topically 3 (three) times daily. (Patient not taking: Reported on 05/18/2017), Disp: 454 g, Rfl: 0  Allergies  Allergen Reactions  . Codeine Other (See Comments)    "TRIPPED OUT"  DIDN'T LIKE THE [facility]  . Atorvastatin     muscle pain  . Latex Rash  . Zolpidem Other (See Comments)    Sleep walk     ROS  Ten systems reviewed and is negative except as mentioned in HPI   Objective  Vitals:   05/18/17 1149  BP: 134/82  Pulse: 94  Resp: 16  Temp: 98.7 F (37.1 C)  TempSrc: Oral  SpO2: 97%  Weight: 220 lb 4.8 oz (99.9 kg)    Body mass index is 40.29 kg/m.  Physical Exam  Constitutional: Patient appears well-developed and well-nourished. Obese  No distress.  HEENT: head atraumatic, normocephalic, pupils equal and reactive to light,  neck supple, throat within normal limits Cardiovascular: Normal rate, regular  rhythm and normal heart sounds.  No murmur heard. No BLE edema. Pulmonary/Chest: Effort normal and breath sounds normal. No respiratory distress. Abdominal: Soft.  There is no tenderness. Normal bowel sounds.  Psychiatric: Patient has a normal mood and affect. behavior is normal. Judgment and thought content normal.  Recent Results (from the past 2160 hour(s))  POCT UA - Microalbumin     Status: Normal   Collection Time: 02/19/17 10:40 AM  Result Value Ref Range   Microalbumin Ur, POC 20 mg/L   Creatinine, POC  mg/dL   Albumin/Creatinine Ratio, Urine, POC    POCT HgB A1C     Status: Abnormal   Collection Time: 02/19/17 10:42 AM  Result Value Ref Range   Hemoglobin A1C 8.1   COMPLETE METABOLIC PANEL WITH GFR     Status: Abnormal   Collection Time: 02/19/17 11:28 AM  Result Value Ref Range   Sodium 140 135 - 146 mmol/L   Potassium 4.6 3.5 - 5.3 mmol/L   Chloride 103 98 - 110 mmol/L   CO2 25 20 - 31 mmol/L   Glucose, Bld 204 (H) 65 - 99 mg/dL   BUN 23 7 - 25 mg/dL   Creat 0.95 0.50 - 0.99 mg/dL    Comment:   For patients > or = 68 years of age: The upper reference limit for Creatinine is approximately 13% higher for people identified as African-American.      Total Bilirubin 0.4 0.2 - 1.2 mg/dL   Alkaline Phosphatase 90 33 - 130 U/L   AST 18 10 - 35 U/L   ALT 20 6 - 29 U/L   Total Protein 6.3 6.1 - 8.1 g/dL   Albumin 3.9 3.6 - 5.1 g/dL   Calcium 9.5 8.6 - 10.4 mg/dL   GFR, Est African American 71 >=60 mL/min   GFR, Est Non African American 62 >=60 mL/min  TSH     Status: None   Collection Time: 02/19/17 11:28 AM  Result Value Ref Range   TSH 1.87 mIU/L    Comment:   Reference Range   > or = 20 Years  0.40-4.50   Pregnancy Range First trimester  0.26-2.66 Second trimester 0.55-2.73 Third trimester  0.43-2.91        PHQ2/9: Depression  screen [facility] 2/9 05/18/2017 09/19/2016 08/18/2016 06/27/2016 02/22/2016  Decreased Interest 0 '1 3 3 '$ 0  Down, Depressed, Hopeless 0 '1 3  1 1  '$ PHQ - 2 Score 0 '2 6 4 1  '$ Altered sleeping - 0 3 1 0  Tired, decreased energy - 0 '3 3 3  '$ Change in appetite - 0 3 3 0  Feeling bad or failure about yourself  - - '3 3 1  '$ Trouble concentrating - 0 '3 3 1  '$ Moving slowly or fidgety/restless - 0 0 2 0  Suicidal thoughts - 1 0 1 0  PHQ-9 Score - '3 21 20 6  '$ Difficult doing work/chores - Somewhat difficult Very difficult Not difficult at all -     Fall Risk: Fall Risk  05/18/2017 02/27/2017 09/19/2016 08/18/2016 06/27/2016  Falls in the past year? No No No Yes Yes  Comment - - - - 2 falls in past year  Number falls in past yr: - - - 1 2 or more  Injury with Fall? - - - No No  Risk Factor Category  - - - - High Fall Risk  Risk for fall due to : - - - - Impaired mobility  Follow up - - - - Education provided;Falls evaluation completed      Functional Status Survey: Is the patient deaf or have difficulty hearing?: No Does the patient have difficulty seeing, even when wearing glasses/contacts?: Yes Does the patient have difficulty concentrating, remembering, or making decisions?: No Does the patient have difficulty walking or climbing stairs?: No Does the patient have difficulty dressing or bathing?: No Does the patient have difficulty doing errands alone such as visiting a doctor's office or shopping?: No    Assessment & Plan  1. Gastroenteritis  Improving already, still nauseated, also advised to take Imodium prn  - promethazine (PHENERGAN) 12.5 MG tablet; Take 1 tablet (12.5 mg total) by mouth every 8 (eight) hours as needed for nausea or vomiting.  Dispense: 20 tablet; Refill: 0  Advised her to go to [facility] if worsening of symptoms.   2. Cough in adult  Likely from asthma flare, glucose is elevated and we cannot add prednisone, we will try benzonate and rest for now, continue Breo and singulair  - benzonatate (TESSALON) 200 MG capsule; Take 1 capsule (200 mg total) by mouth 3 (three) times daily as needed.  Dispense: 40 capsule;  Refill: 0  3. Moderate persistent asthma with exacerbation  With a flare, see above  4. Dyslipidemia associated with type 2 diabetes mellitus ([name])  Monitor glucose at home, can continue basal insulin , but use sliding scale prn for glucose and do not take pre-meal insulin if not eating. ( contact Endo for direction)

## 2017-05-21 ENCOUNTER — Telehealth: Payer: Self-pay

## 2017-05-21 NOTE — Telephone Encounter (Signed)
Her sugars have been around 200's and she is eating very little but trying to force her self since she knows she has too. Also she has not contacted endocrinology yet but has cut back on her Insulin to Novolin 34 units once two times a day and Lantus is 68 units once daily since she has been sick. She states her diarrhea is starting to slow down and will try the Imodium.

## 2017-05-21 NOTE — Telephone Encounter (Signed)
Patient states she is still not feeling well and still having the diarrhea. [facility] would not fill the Promethazine but patient states she is not nauseous anymore. Please advise on what to take for the diarrhea. Thanks. [phone]

## 2017-05-21 NOTE — Telephone Encounter (Signed)
Imodium  Is it slowing down?  Did she contact endo about insulin? How is her glucose running?

## 2017-05-23 ENCOUNTER — Other Ambulatory Visit: Payer: Medicare PPO | Admitting: Orthotics

## 2017-05-27 ENCOUNTER — Other Ambulatory Visit: Payer: Self-pay | Admitting: Family Medicine

## 2017-05-27 DIAGNOSIS — J40 Bronchitis, not specified as acute or chronic: Secondary | ICD-10-CM

## 2017-05-28 NOTE — Telephone Encounter (Signed)
Patient requesting refill of Breo to [facility].

## 2017-06-01 ENCOUNTER — Other Ambulatory Visit: Payer: Self-pay | Admitting: Family Medicine

## 2017-06-01 NOTE — Telephone Encounter (Signed)
Patient requesting refill of Levothyroxine to [facility].

## 2017-06-06 ENCOUNTER — Ambulatory Visit (INDEPENDENT_AMBULATORY_CARE_PROVIDER_SITE_OTHER): Payer: Medicare PPO | Admitting: Orthotics

## 2017-06-06 DIAGNOSIS — Z872 Personal history of diseases of the skin and subcutaneous tissue: Secondary | ICD-10-CM

## 2017-06-06 DIAGNOSIS — E1142 Type 2 diabetes mellitus with diabetic polyneuropathy: Secondary | ICD-10-CM

## 2017-06-06 DIAGNOSIS — M21961 Unspecified acquired deformity of right lower leg: Secondary | ICD-10-CM

## 2017-06-06 DIAGNOSIS — E11610 Type 2 diabetes mellitus with diabetic neuropathic arthropathy: Secondary | ICD-10-CM | POA: Diagnosis not present

## 2017-06-06 DIAGNOSIS — M19072 Primary osteoarthritis, left ankle and foot: Secondary | ICD-10-CM

## 2017-06-06 NOTE — Progress Notes (Signed)
Patient came in today to pick up standard Afo brace.  Patient was evaluated for fit and function.   The brace fit very well and there were any complaints of the way it felt once donned.  The brace offered ankle stability in both saggital and coroneal planes.  Patient advised to always wear proper fitting shoes with brace. 

## 2017-06-07 DIAGNOSIS — E118 Type 2 diabetes mellitus with unspecified complications: Secondary | ICD-10-CM | POA: Diagnosis not present

## 2017-06-07 DIAGNOSIS — E1165 Type 2 diabetes mellitus with hyperglycemia: Secondary | ICD-10-CM | POA: Diagnosis not present

## 2017-06-07 DIAGNOSIS — Z794 Long term (current) use of insulin: Secondary | ICD-10-CM | POA: Diagnosis not present

## 2017-06-09 DIAGNOSIS — M1460 Charcot's joint, unspecified site: Secondary | ICD-10-CM | POA: Diagnosis not present

## 2017-06-09 DIAGNOSIS — E11610 Type 2 diabetes mellitus with diabetic neuropathic arthropathy: Secondary | ICD-10-CM | POA: Diagnosis not present

## 2017-06-11 DIAGNOSIS — J301 Allergic rhinitis due to pollen: Secondary | ICD-10-CM | POA: Diagnosis not present

## 2017-06-11 DIAGNOSIS — R05 Cough: Secondary | ICD-10-CM | POA: Diagnosis not present

## 2017-06-11 DIAGNOSIS — J4520 Mild intermittent asthma, uncomplicated: Secondary | ICD-10-CM | POA: Diagnosis not present

## 2017-06-12 ENCOUNTER — Telehealth: Payer: Self-pay | Admitting: *Deleted

## 2017-06-12 NOTE — Telephone Encounter (Signed)
LMTCB for consult scheduling with DR.

## 2017-06-14 DIAGNOSIS — M25562 Pain in left knee: Secondary | ICD-10-CM | POA: Diagnosis not present

## 2017-06-14 DIAGNOSIS — E785 Hyperlipidemia, unspecified: Secondary | ICD-10-CM

## 2017-06-14 DIAGNOSIS — Z794 Long term (current) use of insulin: Secondary | ICD-10-CM | POA: Diagnosis not present

## 2017-06-14 DIAGNOSIS — I10 Essential (primary) hypertension: Secondary | ICD-10-CM | POA: Diagnosis not present

## 2017-06-14 DIAGNOSIS — E1159 Type 2 diabetes mellitus with other circulatory complications: Secondary | ICD-10-CM | POA: Diagnosis not present

## 2017-06-14 DIAGNOSIS — E1169 Type 2 diabetes mellitus with other specified complication: Secondary | ICD-10-CM | POA: Insufficient documentation

## 2017-06-14 DIAGNOSIS — S86912A Strain of unspecified muscle(s) and tendon(s) at lower leg level, left leg, initial encounter: Secondary | ICD-10-CM | POA: Diagnosis not present

## 2017-06-14 DIAGNOSIS — E1142 Type 2 diabetes mellitus with diabetic polyneuropathy: Secondary | ICD-10-CM | POA: Diagnosis not present

## 2017-06-14 DIAGNOSIS — E039 Hypothyroidism, unspecified: Secondary | ICD-10-CM | POA: Diagnosis not present

## 2017-06-15 ENCOUNTER — Other Ambulatory Visit: Payer: Self-pay | Admitting: Family Medicine

## 2017-06-15 ENCOUNTER — Telehealth: Payer: Self-pay

## 2017-06-15 DIAGNOSIS — G44229 Chronic tension-type headache, not intractable: Secondary | ICD-10-CM

## 2017-06-15 DIAGNOSIS — G4709 Other insomnia: Secondary | ICD-10-CM

## 2017-06-15 NOTE — Telephone Encounter (Signed)
Attempted to schedule referral  Not available will call back later

## 2017-06-26 ENCOUNTER — Ambulatory Visit (INDEPENDENT_AMBULATORY_CARE_PROVIDER_SITE_OTHER): Payer: Commercial Managed Care - HMO | Admitting: Internal Medicine

## 2017-06-26 ENCOUNTER — Ambulatory Visit
Admission: RE | Admit: 2017-06-26 | Discharge: 2017-06-26 | Disposition: A | Discharge status: Home / Self Care | Payer: Commercial Managed Care - HMO | Source: Ambulatory Visit | Attending: Family Medicine | Admitting: Family Medicine

## 2017-06-26 ENCOUNTER — Encounter: Payer: Self-pay | Admitting: Internal Medicine

## 2017-06-26 VITALS — BP 162/98 | HR 122 | Resp 16 | Ht 62.0 in | Wt 247.0 lb

## 2017-06-26 DIAGNOSIS — Z13820 Encounter for screening for osteoporosis: Secondary | ICD-10-CM | POA: Insufficient documentation

## 2017-06-26 DIAGNOSIS — Z1231 Encounter for screening mammogram for malignant neoplasm of breast: Secondary | ICD-10-CM | POA: Insufficient documentation

## 2017-06-26 DIAGNOSIS — G4733 Obstructive sleep apnea (adult) (pediatric): Secondary | ICD-10-CM

## 2017-06-26 DIAGNOSIS — Z780 Asymptomatic menopausal state: Secondary | ICD-10-CM | POA: Insufficient documentation

## 2017-06-26 DIAGNOSIS — R0602 Shortness of breath: Secondary | ICD-10-CM

## 2017-06-26 MED ORDER — FLUTICASONE-SALMETEROL 232-14 MCG/ACT IN AEPB: 2.0000 | INHALATION_SPRAY | Freq: Two times a day (BID) | RESPIRATORY_TRACT | 5 refills | Status: DC

## 2017-06-26 MED ORDER — FUROSEMIDE 20 MG PO TABS: 20.0000 mg | ORAL_TABLET | Freq: Every morning | ORAL | 1 refills | Status: DC

## 2017-06-26 NOTE — Progress Notes (Signed)
[facility]  Pulmonary Medicine Consultation      Assessment and Plan:  66 yo morbidly obese female with asthma, volume overload.   Asthma.  --Asthma with elevated eosinophils  --Will start generic advair. Continue singulair.   [name].  --Continues to have symptoms.  --Continue omeprazole and ranitidine, discussed anti-reflux measures.   Volume overload.  2+ bilateral lower extremity edema. We'll start Lasix 20 mg once daily for 1 week. Sent for echocardiogram- possible CHF.   Obstructive sleep apnea --Intolerant of cpap mask.  --Will refer to mask fitting clinic.   Morbid obesity.  --Weight loss would be beneficial for her health and breathing.    Date: 06/26/2017  MRN# [id number] Summer [name] 1950-09-27  Referring Physician:   JENNI THEW is a 66 y.o. old female seen in consultation for chief complaint of:    Chief Complaint  Patient presents with  . Asthma    Former BQ patient last seen 08/24/14-cough  . Sleep Apnea    Pt currently not using cpap therapy,former DME [facility].    HPI:   She notices that she has been short winded for the past 2 years, it has been progressive over the past year. She has acid reflux, she continues to have symptoms if she misses a dose. She uses omeprazole at night and ranitidine in the am.  She was tried on breo inhaler sample, which helped but it was too expensive, she is currently in the donut hole.  She is currently using a rescue inhaler which helps.  She has no allergies as far as she knows. She denies sinus drainage.  She has no pets.  She is no longer using cpap because she could not tolerate it. 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  She has gained 20 lbs in past 5 months. She notes chronic lower extremity edema.    08/18/16 CBC, Eos=438 **05/12/14; Echo; EF=55%; Pulm systolic pressure was normal.   PMHX:   Past Medical History:  Diagnosis Date  . Asthma   . Carpal tunnel syndrome   . Chronic kidney disease     . Depressive disorder   . Diabetes mellitus   . Hyperlipidemia   . Hypertension   . Insomnia   . Lumbago   . Memory loss   . Obesity   . 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)   . Rhinitis, allergic   . Rosacea   . Thyroid disease   . Unspecified hearing loss   . Unspecified hereditary and idiopathic peripheral neuropathy   . Unspecified sleep apnea    Surgical Hx:  Past Surgical History:  Procedure Laterality Date  . CATARACT EXTRACTION  01/2011   right  . eye lid surgery  2013   bilateral  . FOOT SURGERY    . NECK SURGERY    . TUBAL LIGATION    . VAGINAL HYSTERECTOMY  1989   Family Hx:  Family History  Problem Relation Age of Onset  . Heart attack Mother   . Aneurysm Mother    Social Hx:   Social History  Substance Use Topics  . Smoking status: Never Smoker  . Smokeless tobacco: Never Used  . Alcohol use No   Medication:    Current Outpatient Prescriptions:  .  amitriptyline (ELAVIL) 50 MG tablet, TAKE 1/2 TO 1 TABLET AT BEDTIME, Disp: 30 tablet, Rfl: 0 .  ASPIRIN LOW DOSE 81 MG EC tablet, TAKE 1 TABLET (81 MG TOTAL) BY MOUTH DAILY., Disp: 30 tablet, Rfl: 0 .  diclofenac sodium (VOLTAREN) 1 % GEL, Apply topically as needed. Reported on 02/22/2016, Disp: , Rfl:  .  Glucose Blood (BLOOD GLUCOSE TEST STRIPS) STRP, , Disp: , Rfl:  .  Insulin Glargine (LANTUS SOLOSTAR) 100 UNIT/ML Solostar Pen, Inject 50 Units into the skin daily at 10 pm. (Patient taking differently: Inject 60 Units into the skin daily at 10 pm. ), Disp: 15 mL, Rfl: 2 .  Insulin Pen Needle (RELION PEN NEEDLE 31G/8MM) 31G X 8 MM MISC, by Does not apply route 2 (two) times daily., Disp: , Rfl:  .  insulin regular (NOVOLIN R,HUMULIN R) 100 units/mL injection, Inject 0.1 mLs (10 Units total) into the skin 3 (three) times daily before meals. (Patient taking differently: Inject 28 Units into the skin 3 (three) times daily before meals. ), Disp: 10 mL, Rfl: 0 .  levothyroxine (SYNTHROID, LEVOTHROID) 25 MCG tablet,  TAKE 1 TABLET EVERY DAY  EXCEPT TAKE 2 TABLETS  ON  SUNDAYS, Disp: 100 tablet, Rfl: 0 .  losartan-hydrochlorothiazide (HYZAAR) 100-12.5 MG tablet, Take 1 tablet by mouth daily., Disp: 90 tablet, Rfl: 1 .  metFORMIN (GLUCOPHAGE-XR) 500 MG 24 hr tablet, Take 1 tablet (500 mg total) by mouth daily with breakfast., Disp: 180 tablet, Rfl: 1 .  metoprolol tartrate (LOPRESSOR) 25 MG tablet, Take 1 tablet (25 mg total) by mouth 2 (two) times daily., Disp: 180 tablet, Rfl: 1 .  montelukast (SINGULAIR) 10 MG tablet, TAKE 1 TABLET AT BEDTIME, Disp: 90 tablet, Rfl: 1 .  omega-3 acid ethyl esters (LOVAZA) 1 g capsule, Take 2 capsules (2 g total) by mouth 2 (two) times daily., Disp: 360 capsule, Rfl: 1 .  omeprazole (PRILOSEC) 40 MG capsule, TAKE 1 CAPSULE (40 MG TOTAL) BY MOUTH DAILY., Disp: 90 capsule, Rfl: 1 .  pregabalin (LYRICA) 300 MG capsule, Take 2 capsules (600 mg total) by mouth daily., Disp: 180 capsule, Rfl: 1 .  ranitidine (ZANTAC) 150 MG tablet, Take 150 mg by mouth at bedtime., Disp: , Rfl:  .  RELION INSULIN SYRINGE 1ML/31G 31G X 5/16" 1 ML MISC, , Disp: , Rfl:  .  VITAMIN D, CHOLECALCIFEROL, PO, Take 20,000 Units by mouth daily., Disp: , Rfl:  .  Blood Glucose Monitoring Suppl (ACCU-CHEK AVIVA PLUS) w/Device KIT, , Disp: , Rfl:  .  BREO ELLIPTA 100-25 MCG/INH AEPB, INHALE 1 PUFF BY MOUTH ONCE DAILY (Patient not taking: Reported on 06/26/2017), Disp: 60 each, Rfl: 0 .  VENTOLIN HFA 108 (90 Base) MCG/ACT inhaler, Inhale 2 puffs into the lungs every 6 (six) hours as needed., Disp: , Rfl:    Allergies:  Codeine; Atorvastatin; Latex; and Zolpidem  Review of Systems: Gen:  Denies  fever, sweats, chills HEENT: Denies blurred vision, double vision. bleeds, sore throat Cvc:  No dizziness, chest pain. Resp:   Denies cough or sputum production, shortness of breath Gi: Denies swallowing difficulty, stomach pain. Gu:  Denies bladder incontinence, burning urine Ext:   No Joint pain, stiffness. Skin:  No skin rash,  hives  Endoc:  No polyuria, polydipsia. Psych: No depression, insomnia. Other:  All other systems were reviewed with the patient and were negative other that what is mentioned in the HPI.   Physical Examination:   VS: BP (!) 162/98 (BP Location: Left Arm, Cuff Size: Large)   Pulse (!) 122   Resp 16   Ht 5\' 2"  (1.575 m)   Wt 247 lb (112 kg)   SpO2 98%   BMI 45.18 kg/m   General Appearance: No  distress, morbidly obese. Walks with severe limp.  Neuro:without focal findings,  speech normal. HEENT: PERRLA, EOM intact.   Pulmonary: normal breath sounds, No wheezing. Decreased air entry bilaterally.  CardiovascularNormal S1,S2.  No m/r/g.   Abdomen: Benign, Soft, non-tender. Renal:  No costovertebral tenderness  GU:  No performed at this time. Endoc: No evident thyromegaly, no signs of acromegaly. Skin:   warm, no rashes, no ecchymosis  Extremities: normal, no cyanosis, clubbing.  Other findings:    LABORATORY PANEL:   CBC No results for input(s): WBC, HGB, HCT, PLT in the last 168 hours. ------------------------------------------------------------------------------------------------------------------  Chemistries  No results for input(s): NA, K, CL, CO2, GLUCOSE, BUN, CREATININE, CALCIUM, MG, AST, ALT, ALKPHOS, BILITOT in the last 168 hours.  Invalid input(s): GFRCGP ------------------------------------------------------------------------------------------------------------------  Cardiac Enzymes No results for input(s): TROPONINI in the last 168 hours. ------------------------------------------------------------  RADIOLOGY:  Dg Bone Density  Result Date: 06/26/2017 EXAM: DUAL X-RAY ABSORPTIOMETRY (DXA) FOR BONE MINERAL DENSITY IMPRESSION: Dear Dr. Carlynn Purl, Your patient Lurie Mullane completed a BMD test on 06/26/2017 using the Lunar iDXA DXA System (analysis version: 14.10) manufactured by [employer]. The following summarizes the results of our evaluation.  PATIENT BIOGRAPHICAL: Name: Summer [name], Summer [name] Patient ID: [id number] Birth Date: 07-06-1951 Height: 62.0 in. Gender: Female Exam Date: 06/26/2017 Weight: 264.4 lbs. Indications: Caucasian, Diabetic, Height Loss, History of Fracture (Adult), History of Spinal Surgery, Hypothyroid, Hysterectomy, Oophorectomy Unilateral, Postmenopausal Fractures: Left ankle Treatments: Insulin, Levothyroxine, Metformin, Omeprazole, Vitamin D ASSESSMENT: The BMD measured at Femur Neck Right is 1.112 g/cm2 with a T-score of 0.5. This patient is considered normal according to World Health Organization [facility]) criteria. Lumbar spine was not utilized due to advanced degenerative changes. Site Region Measured Measured WHO Young Adult BMD Date       Age      Classification T-score DualFemur Neck Right 06/26/2017 68.4 Normal 0.5 1.112 g/cm2 Left Forearm Radius 33% 06/26/2017 68.4 Normal 1.2 0.983 g/cm2 World Health Organization [facility]) criteria for post-menopausal, Caucasian Women: Normal:       T-score at or above -1 SD Osteopenia:   T-score between -1 and -2.5 SD Osteoporosis: T-score at or below -2.5 SD RECOMMENDATIONS: National Osteoporosis Foundation recommends that FDA-approved medical therapies be considered in postmenopausal women and men age 41 or older with a: 1. Hip or vertebral (clinical or morphometric) fracture. 2. T-score of < -2.5 at the spine or hip. 3. Ten-year fracture probability by FRAX of 3% or greater for hip fracture or 20% or greater for major osteoporotic fracture. All treatment decisions require clinical judgment and consideration of individual patient factors, including patient preferences, co-morbidities, previous drug use, risk factors not captured in the FRAX model (e.g. falls, vitamin D deficiency, increased bone turnover, interval significant decline in bone density) and possible under - or over-estimation of fracture risk by FRAX. All patients should ensure an adequate intake of dietary calcium (1200 mg/d) and vitamin  D (800 IU daily) unless contraindicated. FOLLOW-UP: People with diagnosed cases of osteoporosis or at high risk for fracture should have regular bone mineral density tests. For patients eligible for Medicare, routine testing is allowed once every 2 years. The testing frequency can be increased to one year for patients who have rapidly progressing disease, those who are receiving or discontinuing medical therapy to restore bone mass, or have additional risk factors. I have reviewed this report, and agree with the above findings. [facility] Electronically Signed   By: Myles Rosenthal M.D.   On: 06/26/2017 11:39   Mm  Digital Screening  Result Date: 06/26/2017 CLINICAL DATA:  Screening. EXAM: DIGITAL SCREENING BILATERAL MAMMOGRAM WITH CAD COMPARISON:  Previous exam(s). ACR Breast Density Category b: There are scattered areas of fibroglandular density. FINDINGS: There are no findings suspicious for malignancy. Images were processed with CAD. IMPRESSION: No mammographic evidence of malignancy. A result letter of this screening mammogram will be mailed directly to the patient. RECOMMENDATION: Screening mammogram in one year. (Code:SM-B-01Y) BI-RADS CATEGORY  1: Negative. Electronically Signed   By: Everlean Alstrom M.D.   On: 06/26/2017 10:25       Thank  you for the consultation and for allowing [facility] to assist in the care of your patient. Our recommendations are noted above.  Please contact us if we can be of further service.   Marda Stalker, MD.  Board Certified in Internal Medicine, Pulmonary Medicine, [place], and Sleep Medicine.  [facility] Office Number: [phone]  Patricia Pesa, M.D.  Merton Border, M.D  06/26/2017

## 2017-06-26 NOTE — Patient Instructions (Addendum)
Avoid eating 4 hours before bedtime.  Avoid drinking 3 hours before bedtime.   Start generic advair 2 puffs twice daily, rinse mouth after use. If this is too expensive, call us back and we will try something less expensive.   Will send for CXR.   --Refer to mask fitting clinic.

## 2017-06-27 ENCOUNTER — Other Ambulatory Visit: Payer: Medicare PPO | Admitting: Orthotics

## 2017-06-27 ENCOUNTER — Ambulatory Visit (INDEPENDENT_AMBULATORY_CARE_PROVIDER_SITE_OTHER): Payer: Medicare PPO

## 2017-06-27 VITALS — BP 142/60 | HR 92 | Temp 97.3°F | Resp 20 | Ht 62.0 in | Wt 247.0 lb

## 2017-06-27 DIAGNOSIS — Z23 Encounter for immunization: Secondary | ICD-10-CM | POA: Diagnosis not present

## 2017-06-27 DIAGNOSIS — Z0000 Encounter for general adult medical examination without abnormal findings: Secondary | ICD-10-CM

## 2017-06-27 NOTE — Progress Notes (Signed)
Subjective:   Summer [name] is a 66 y.o. female who presents for Medicare Annual (Subsequent) preventive examination.  Review of Systems:  N/A Cardiac Risk Factors include: advanced age (>56mn, >>42women);hypertension;diabetes mellitus;obesity (BMI >30kg/m2);sedentary lifestyle;dyslipidemia     Objective:     Vitals: BP (!) 142/60 (BP Location: Left Arm, Patient Position: Sitting, Cuff Size: Large)   Pulse 92   Temp (!) 97.3 F (36.3 C) (Oral)   Resp 20   Ht '5\' 2"'$  (1.575 m)   Wt 247 lb (112 kg)   BMI 45.18 kg/m   Body mass index is 45.18 kg/m.   Tobacco History  Smoking Status  . Never Smoker  Smokeless Tobacco  . Never Used     Counseling given: Not Answered   Past Medical History:  Diagnosis Date  . Asthma   . Carpal tunnel syndrome   . Chronic kidney disease   . Depressive disorder   . Diabetes mellitus   . Hyperlipidemia   . Hypertension   . Insomnia   . Lumbago   . Memory loss   . Obesity   . 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)   . Rhinitis, allergic   . Rosacea   . Thyroid disease   . Unspecified hearing loss   . Unspecified hereditary and idiopathic peripheral neuropathy   . Unspecified sleep apnea    Past Surgical History:  Procedure Laterality Date  . ANKLE SURGERY Left approx Jan 2018  . CATARACT EXTRACTION  01/2011   right  . eye lid surgery  2013   bilateral  . FOOT SURGERY    . NECK SURGERY    . TUBAL LIGATION    . VAGINAL HYSTERECTOMY  1989   Family History  Problem Relation Age of Onset  . Heart attack Mother   . Aneurysm Mother    History  Sexual Activity  . Sexual activity: Yes  . Partners: Male    Outpatient Encounter Prescriptions as of 06/27/2017  Medication Sig  . amitriptyline (ELAVIL) 50 MG tablet TAKE 1/2 TO 1 TABLET AT BEDTIME  . ASPIRIN LOW DOSE 81 MG EC tablet TAKE 1 TABLET (81 MG TOTAL) BY MOUTH DAILY.  .Marland KitchenBlood Glucose Monitoring Suppl (ACCU-CHEK AVIVA PLUS) w/Device KIT   . diclofenac sodium (VOLTAREN) 1 % GEL  Apply topically as needed. Reported on 02/22/2016  . Fluticasone-Salmeterol 232-14 MCG/ACT AEPB Inhale 2 puffs into the lungs 2 (two) times daily. Rinse mouth after use.  . furosemide (LASIX) 20 MG tablet Take 1 tablet (20 mg total) by mouth every morning.  . Glucose Blood (BLOOD GLUCOSE TEST STRIPS) STRP   . Insulin Glargine (LANTUS SOLOSTAR) 100 UNIT/ML Solostar Pen Inject 50 Units into the skin daily at 10 pm. (Patient taking differently: Inject 60 Units into the skin daily at 10 pm. )  . Insulin Pen Needle (RELION PEN NEEDLE 31G/8MM) 31G X 8 MM MISC by Does not apply route 2 (two) times daily.  . insulin regular (NOVOLIN R,HUMULIN R) 100 units/mL injection Inject 0.1 mLs (10 Units total) into the skin 3 (three) times daily before meals. (Patient taking differently: Inject 28 Units into the skin 3 (three) times daily before meals. )  . levothyroxine (SYNTHROID, LEVOTHROID) 25 MCG tablet TAKE 1 TABLET EVERY DAY  EXCEPT TAKE 2 TABLETS  ON  SUNDAYS  . losartan-hydrochlorothiazide (HYZAAR) 100-12.5 MG tablet Take 1 tablet by mouth daily.  . metFORMIN (GLUCOPHAGE-XR) 500 MG 24 hr tablet Take 1 tablet (500 mg total) by mouth daily with breakfast.  .  metoprolol tartrate (LOPRESSOR) 25 MG tablet Take 1 tablet (25 mg total) by mouth 2 (two) times daily.  . montelukast (SINGULAIR) 10 MG tablet TAKE 1 TABLET AT BEDTIME  . omega-3 acid ethyl esters (LOVAZA) 1 g capsule Take 2 capsules (2 g total) by mouth 2 (two) times daily.  Marland Kitchen omeprazole (PRILOSEC) 40 MG capsule TAKE 1 CAPSULE (40 MG TOTAL) BY MOUTH DAILY.  Marland Kitchen pregabalin (LYRICA) 300 MG capsule Take 2 capsules (600 mg total) by mouth daily.  . ranitidine (ZANTAC) 150 MG tablet Take 150 mg by mouth at bedtime.  Marland Kitchen RELION INSULIN SYRINGE 1ML/31G 31G X 5/16" 1 ML MISC   . VENTOLIN HFA 108 (90 Base) MCG/ACT inhaler Inhale 2 puffs into the lungs every 6 (six) hours as needed.  . vitamin C (ASCORBIC ACID) 500 MG tablet Take 500 mg by mouth daily.  Marland Kitchen VITAMIN D,  CHOLECALCIFEROL, PO Take 20,000 Units by mouth daily.  . [DISCONTINUED] BREO ELLIPTA 100-25 MCG/INH AEPB INHALE 1 PUFF BY MOUTH ONCE DAILY   No facility-administered encounter medications on file as of 06/27/2017.     Activities of Daily Living In your present state of health, do you have any difficulty performing the following activities: 06/27/2017 05/18/2017  Hearing? N N  Vision? N Y  Difficulty concentrating or making decisions? Y N  Comment occasional memmory loss -  Walking or climbing stairs? Y N  Comment short of breath -  Dressing or bathing? N N  Doing errands, shopping? N N  Preparing Food and eating ? N -  Using the Toilet? N -  In the past six months, have you accidently leaked urine? N -  Do you have problems with loss of bowel control? N -  Managing your Medications? N -  Managing your Finances? N -  Housekeeping or managing your Housekeeping? N -  Some recent data might be hidden    Patient Care Team: Steele Sizer, MD as PCP - General Gardiner Barefoot, DPM as Consulting Physician (Podiatry) Laverle Hobby, MD as Consulting Physician (Pulmonary Disease) Sherryle Lis Gwen Her, MD as Consulting Physician (Specialist)    Assessment:     Exercise Activities and Dietary recommendations Current Exercise Habits: The patient does not participate in regular exercise at present, Exercise limited by: respiratory conditions(s);orthopedic condition(s) (L ankle repair; asthma)  Goals    . Have 3 meals a day          Recommend to eat 3 small meals per day and to limit meals or snacks prior to bedtime. Reduce sweets      Fall Risk Fall Risk  06/27/2017 05/18/2017 02/27/2017 09/19/2016 08/18/2016  Falls in the past year? Yes No No No Yes  Comment - - - - -  Number falls in past yr: 2 or more - - - 1  Injury with Fall? No - - - No  Risk Factor Category  High Fall Risk - - - -  Comment short of breath, diuretics - - - -  Risk for fall due to : Impaired  balance/gait;Impaired mobility;Medication side effect - - - -  Risk for fall due to: Comment s/p L ankle repair - - - -  Follow up Education provided;Falls prevention discussed - - - -   Depression Screen PHQ 2/9 Scores 06/27/2017 05/18/2017 09/19/2016 08/18/2016  PHQ - 2 Score 6 0 2 6  PHQ- 9 Score 24 - 3 21     Cognitive Function     6CIT Screen 06/27/2017  What Year? 0  points  What month? 0 points  What time? 0 points  Count back from 20 0 points  Months in reverse 0 points  Repeat phrase 0 points  Total Score 0    Immunization History  Administered Date(s) Administered  . Influenza Split 06/13/2013, 06/16/2014  . Influenza, High Dose Seasonal PF 05/24/2015, 06/27/2017  . Influenza, Seasonal, Injecte, Preservative Fre 06/03/2010, 07/25/2011  . Influenza-Unspecified 07/21/2016  . Pneumococcal Conjugate-13 07/14/2013  . Pneumococcal Polysaccharide-23 10/01/2006, 06/16/2014  . Tdap 03/04/2010, 07/20/2015  . Zoster 08/12/2012   Screening Tests Health Maintenance  Topic Date Due  . OPHTHALMOLOGY EXAM  07/26/2017  . HEMOGLOBIN A1C  08/21/2017  . FOOT EXAM  04/05/2018  . MAMMOGRAM  06/27/2019  . COLONOSCOPY  09/16/2019  . TETANUS/TDAP  07/19/2025  . INFLUENZA VACCINE  Completed  . DEXA SCAN  Completed  . Hepatitis C Screening  Completed  . PNA vac Low Risk Adult  Completed      Plan:    I have personally reviewed and addressed the Medicare Annual Wellness questionnaire and have noted the following in the patient's chart:  A. Medical and social history B. Use of alcohol, tobacco or illicit drugs  C. Current medications and supplements D. Functional ability and status E.  Nutritional status F.  Physical activity G. Advance directives H. List of other physicians I.  Hospitalizations, surgeries, and ER visits in previous 12 months J.  [place] such as hearing and vision if needed, cognitive and depression L. Referrals and appointments - none  In  addition, I have reviewed and discussed with patient certain preventive protocols, quality metrics, and best practice recommendations. A written personalized care plan for preventive services as well as general preventive health recommendations were provided to patient.  See attached scanned questionnaire for additional information.   Signed,  Aleatha Borer, LPN Nurse Health Advisor   MD Recommendations: None   I have reviewed this encounter including the documentation in this note and/or discussed this patient with the provider, Aleatha Borer, LPN  I am certifying that I agree with the content of this note as supervising physician.  Steele Sizer, MD [facility] 06/27/2017, 3:10 PM

## 2017-06-27 NOTE — Patient Instructions (Signed)
Summer [name] , Thank you for taking time to come for your Medicare Wellness Visit. I appreciate your ongoing commitment to your health goals. Please review the following plan we discussed and let me know if I can assist you in the future.   Screening recommendations/referrals: Colonoscopy: Completed 09/15/09 Mammogram: Completed 06/26/17 Bone Density: Completed 06/26/17 Recommended yearly ophthalmology/optometry visit for glaucoma screening and checkup. Please call your doctor to schedule your diabetic eye exam. Recommended yearly dental visit for hygiene and checkup  Vaccinations: Influenza vaccine: Given today Pneumococcal vaccine: Completed series Tdap vaccine: Up to date Shingles vaccine: Up to date    Advanced directives: Please bring a copy of your POA (Power of [place]) and/or Living Will to your next appointment.   Conditions/risks identified: Recommend to eat 3 small meals per day and to limit meals or snacks prior to bedtime. Reduce sweets  Next appointment: Please schedule an appointment with Dr. Ancil Boozer before leaving the office today. Follow up with the Nurse Health Advisor for your annual wellness exam in one year.   Preventive Care 34 Years and Older, Female Preventive care refers to lifestyle choices and visits with your health care provider that can promote health and wellness. What does preventive care include?  A yearly physical exam. This is also called an annual well check.  Dental exams once or twice a year.  Routine eye exams. Ask your health care provider how often you should have your eyes checked.  Personal lifestyle choices, including:  Daily care of your teeth and gums.  Regular physical activity.  Eating a healthy diet.  Avoiding tobacco and drug use.  Limiting alcohol use.  Practicing safe sex.  Taking low-dose aspirin every day.  Taking vitamin and mineral supplements as recommended by your health care provider. What happens during an annual  well check? The services and screenings done by your health care provider during your annual well check will depend on your age, overall health, lifestyle risk factors, and family history of disease. Counseling  Your health care provider may ask you questions about your:  Alcohol use.  Tobacco use.  Drug use.  Emotional well-being.  Home and relationship well-being.  Sexual activity.  Eating habits.  History of falls.  Memory and ability to understand (cognition).  Work and work Statistician.  Reproductive health. Screening  You may have the following tests or measurements:  Height, weight, and BMI.  Blood pressure.  Lipid and cholesterol levels. These may be checked every 5 years, or more frequently if you are over 4 years old.  Skin check.  Lung cancer screening. You may have this screening every year starting at age 45 if you have a 30-pack-year history of smoking and currently smoke or have quit within the past 15 years.  Fecal occult blood test (FOBT) of the stool. You may have this test every year starting at age 32.  Flexible sigmoidoscopy or colonoscopy. You may have a sigmoidoscopy every 5 years or a colonoscopy every 10 years starting at age 43.  Hepatitis C blood test.  Hepatitis B blood test.  Sexually transmitted disease (STD) testing.  Diabetes screening. This is done by checking your blood sugar (glucose) after you have not eaten for a while (fasting). You may have this done every 1-3 years.  Bone density scan. This is done to screen for osteoporosis. You may have this done starting at age 48.  Mammogram. This may be done every 1-2 years. Talk to your health care provider about how often  you should have regular mammograms. Talk with your health care provider about your test results, treatment options, and if necessary, the need for more tests. Vaccines  Your health care provider may recommend certain vaccines, such as:  Influenza vaccine. This  is recommended every year.  Tetanus, diphtheria, and acellular pertussis (Tdap, Td) vaccine. You may need a Td booster every 10 years.  Zoster vaccine. You may need this after age 42.  Pneumococcal 13-valent conjugate (PCV13) vaccine. One dose is recommended after age 72.  Pneumococcal polysaccharide (PPSV23) vaccine. One dose is recommended after age 23. Talk to your health care provider about which screenings and vaccines you need and how often you need them. This information is not intended to replace advice given to you by your health care provider. Make sure you discuss any questions you have with your health care provider. Document Released: 09/24/2015 Document Revised: 05/17/2016 Document Reviewed: 06/29/2015 Elsevier Interactive Patient Education  2017 [place] Prevention in the Home Falls can cause injuries. They can happen to people of all ages. There are many things you can do to make your home safe and to help prevent falls. What can I do on the outside of my home?  Regularly fix the edges of walkways and driveways and fix any cracks.  Remove anything that might make you trip as you walk through a door, such as a raised step or threshold.  Trim any bushes or trees on the path to your home.  Use bright outdoor lighting.  Clear any walking paths of anything that might make someone trip, such as rocks or tools.  Regularly check to see if handrails are loose or broken. Make sure that both sides of any steps have handrails.  Any raised decks and porches should have guardrails on the edges.  Have any leaves, snow, or ice cleared regularly.  Use sand or salt on walking paths during winter.  Clean up any spills in your garage right away. This includes oil or grease spills. What can I do in the bathroom?  Use night lights.  Install grab bars by the toilet and in the tub and shower. Do not use towel bars as grab bars.  Use non-skid mats or decals in the tub or  shower.  If you need to sit down in the shower, use a plastic, non-slip stool.  Keep the floor dry. Clean up any water that spills on the floor as soon as it happens.  Remove soap buildup in the tub or shower regularly.  Attach bath mats securely with double-sided non-slip rug tape.  Do not have throw rugs and other things on the floor that can make you trip. What can I do in the bedroom?  Use night lights.  Make sure that you have a light by your bed that is easy to reach.  Do not use any sheets or blankets that are too big for your bed. They should not hang down onto the floor.  Have a firm chair that has side arms. You can use this for support while you get dressed.  Do not have throw rugs and other things on the floor that can make you trip. What can I do in the kitchen?  Clean up any spills right away.  Avoid walking on wet floors.  Keep items that you use a lot in easy-to-reach places.  If you need to reach something above you, use a strong step stool that has a grab bar.  Keep electrical cords  out of the way.  Do not use floor polish or wax that makes floors slippery. If you must use wax, use non-skid floor wax.  Do not have throw rugs and other things on the floor that can make you trip. What can I do with my stairs?  Do not leave any items on the stairs.  Make sure that there are handrails on both sides of the stairs and use them. Fix handrails that are broken or loose. Make sure that handrails are as long as the stairways.  Check any carpeting to make sure that it is firmly attached to the stairs. Fix any carpet that is loose or worn.  Avoid having throw rugs at the top or bottom of the stairs. If you do have throw rugs, attach them to the floor with carpet tape.  Make sure that you have a light switch at the top of the stairs and the bottom of the stairs. If you do not have them, ask someone to add them for you. What else can I do to help prevent  falls?  Wear shoes that:  Do not have high heels.  Have rubber bottoms.  Are comfortable and fit you well.  Are closed at the toe. Do not wear sandals.  If you use a stepladder:  Make sure that it is fully opened. Do not climb a closed stepladder.  Make sure that both sides of the stepladder are locked into place.  Ask someone to hold it for you, if possible.  Clearly mark and make sure that you can see:  Any grab bars or handrails.  First and last steps.  Where the edge of each step is.  Use tools that help you move around (mobility aids) if they are needed. These include:  Canes.  Walkers.  Scooters.  Crutches.  Turn on the lights when you go into a dark area. Replace any light bulbs as soon as they burn out.  Set up your furniture so you have a clear path. Avoid moving your furniture around.  If any of your floors are uneven, fix them.  If there are any pets around you, be aware of where they are.  Review your medicines with your doctor. Some medicines can make you feel dizzy. This can increase your chance of falling. Ask your doctor what other things that you can do to help prevent falls. This information is not intended to replace advice given to you by your health care provider. Make sure you discuss any questions you have with your health care provider. Document Released: 06/24/2009 Document Revised: 02/03/2016 Document Reviewed: 10/02/2014 Elsevier Interactive Patient Education  2017 [employer].

## 2017-07-02 ENCOUNTER — Ambulatory Visit (HOSPITAL_BASED_OUTPATIENT_CLINIC_OR_DEPARTMENT_OTHER): Payer: Commercial Managed Care - HMO | Attending: Internal Medicine | Admitting: Radiology

## 2017-07-02 DIAGNOSIS — G4733 Obstructive sleep apnea (adult) (pediatric): Secondary | ICD-10-CM

## 2017-07-04 ENCOUNTER — Ambulatory Visit
Admission: RE | Admit: 2017-07-04 | Discharge: 2017-07-04 | Disposition: A | Discharge status: Home / Self Care | Payer: Commercial Managed Care - HMO | Source: Ambulatory Visit | Attending: Internal Medicine | Admitting: Internal Medicine

## 2017-07-04 DIAGNOSIS — G4730 Sleep apnea, unspecified: Secondary | ICD-10-CM | POA: Insufficient documentation

## 2017-07-04 DIAGNOSIS — G4700 Insomnia, unspecified: Secondary | ICD-10-CM | POA: Diagnosis not present

## 2017-07-04 DIAGNOSIS — E079 Disorder of thyroid, unspecified: Secondary | ICD-10-CM | POA: Diagnosis not present

## 2017-07-04 DIAGNOSIS — E785 Hyperlipidemia, unspecified: Secondary | ICD-10-CM | POA: Diagnosis not present

## 2017-07-04 DIAGNOSIS — J45909 Unspecified asthma, uncomplicated: Secondary | ICD-10-CM | POA: Diagnosis not present

## 2017-07-04 DIAGNOSIS — I129 Hypertensive chronic kidney disease with stage 1 through stage 4 chronic kidney disease, or unspecified chronic kidney disease: Secondary | ICD-10-CM | POA: Diagnosis not present

## 2017-07-04 DIAGNOSIS — R0602 Shortness of breath: Secondary | ICD-10-CM | POA: Insufficient documentation

## 2017-07-04 DIAGNOSIS — N189 Chronic kidney disease, unspecified: Secondary | ICD-10-CM | POA: Insufficient documentation

## 2017-07-04 DIAGNOSIS — E1122 Type 2 diabetes mellitus with diabetic chronic kidney disease: Secondary | ICD-10-CM | POA: Diagnosis not present

## 2017-07-04 DIAGNOSIS — I071 Rheumatic tricuspid insufficiency: Secondary | ICD-10-CM | POA: Diagnosis not present

## 2017-07-04 NOTE — Progress Notes (Signed)
*  PRELIMINARY RESULTS* Echocardiogram 2D Echocardiogram has been performed.  [name] 07/04/2017, 10:43 AM

## 2017-07-09 DIAGNOSIS — M1460 Charcot's joint, unspecified site: Secondary | ICD-10-CM | POA: Diagnosis not present

## 2017-07-09 DIAGNOSIS — E11610 Type 2 diabetes mellitus with diabetic neuropathic arthropathy: Secondary | ICD-10-CM | POA: Diagnosis not present

## 2017-07-13 NOTE — Progress Notes (Signed)
[facility] Consultation      Assessment and Plan:  66 yo morbidly obese female with asthma, volume overload  Asthma.  --Asthma with elevated eosinophils  --Continue generic advair. Continue singulair.   Summer [name].  --Symptomatic, and may be contributing to dyspnea. --Continue omeprazole and ranitidine, discussed anti-reflux measures.   Volume overload.  2+ bilateral lower extremity edema. -Continue Lasix.  Obstructive sleep apnea --Intolerant of cpap mask.  --s/p mask fitting, encouraged to use CPAP every night.   Morbid obesity.  --Weight loss would be beneficial for her health and breathing.   Deconditioning with unstable gait unstable gait.  --Will refer to PT.   Date: 07/13/2017  MRN# [id number] Summer [name] [birth year]   Summer [name] is a 66 y.o. old female seen in consultation for chief complaint of:    Chief Complaint  Patient presents with  . Asthma    3 wk f/u CXR/Echo/mask fit. SOB w/activity: chest tightness; NP cough    HPI:  The patient is a 66 yo female with persistent dyspnea. At last visit she had poorly controlled asthma, symptomatic gerd, volume overload with peripheral edema and OSA intolerant of CPAP.   She was started on lasix, generic advair, referred to mask fitting clinic and asked to follow up in 3 weeks for close followup.  Since her last visit she continues to use advair 2 puffs twice daily. She has had the CPAP set up but is scared of starting it.   Imaging personally reviewed; CXR 07/16/17; Bibasilar atelectasis.   **Echo 07/04/17; VO=35%; RV systolic function reported as "normal" **08/18/16 CBC, Eos=438 **05/12/14; Echo; EF=55%; Pulm systolic pressure was normal.   Medication:    Current Outpatient Prescriptions:  .  amitriptyline (ELAVIL) 50 MG tablet, TAKE 1/2 TO 1 TABLET AT BEDTIME, Disp: 30 tablet, Rfl: 0 .  ASPIRIN LOW DOSE 81 MG EC tablet, TAKE 1 TABLET (81 MG TOTAL) BY MOUTH DAILY., Disp: 30 tablet, Rfl: 0 .   Blood Glucose Monitoring Suppl (ACCU-CHEK AVIVA PLUS) w/Device KIT, , Disp: , Rfl:  .  diclofenac sodium (VOLTAREN) 1 % GEL, Apply topically as needed. Reported on 02/22/2016, Disp: , Rfl:  .  Fluticasone-Salmeterol 232-14 MCG/ACT AEPB, Inhale 2 puffs into the lungs 2 (two) times daily. Rinse mouth after use., Disp: 1 each, Rfl: 5 .  furosemide (LASIX) 20 MG tablet, Take 1 tablet (20 mg total) by mouth every morning., Disp: 15 tablet, Rfl: 1 .  Glucose Blood (BLOOD GLUCOSE TEST STRIPS) STRP, , Disp: , Rfl:  .  Insulin Glargine (LANTUS SOLOSTAR) 100 UNIT/ML Solostar Pen, Inject 50 Units into the skin daily at 10 pm. (Patient taking differently: Inject 60 Units into the skin daily at 10 pm. ), Disp: 15 mL, Rfl: 2 .  Insulin Pen Needle (RELION PEN NEEDLE 31G/8MM) 31G X 8 MM MISC, by Does not apply route 2 (two) times daily., Disp: , Rfl:  .  insulin regular (NOVOLIN R,HUMULIN R) 100 units/mL injection, Inject 0.1 mLs (10 Units total) into the skin 3 (three) times daily before meals. (Patient taking differently: Inject 28 Units into the skin 3 (three) times daily before meals. ), Disp: 10 mL, Rfl: 0 .  levothyroxine (SYNTHROID, LEVOTHROID) 25 MCG tablet, TAKE 1 TABLET EVERY DAY  EXCEPT TAKE 2 TABLETS  ON  SUNDAYS, Disp: 100 tablet, Rfl: 0 .  losartan-hydrochlorothiazide (HYZAAR) 100-12.5 MG tablet, Take 1 tablet by mouth daily., Disp: 90 tablet, Rfl: 1 .  metFORMIN (GLUCOPHAGE-XR) 500 MG 24 hr tablet,  Take 1 tablet (500 mg total) by mouth daily with breakfast., Disp: 180 tablet, Rfl: 1 .  metoprolol tartrate (LOPRESSOR) 25 MG tablet, Take 1 tablet (25 mg total) by mouth 2 (two) times daily., Disp: 180 tablet, Rfl: 1 .  montelukast (SINGULAIR) 10 MG tablet, TAKE 1 TABLET AT BEDTIME, Disp: 90 tablet, Rfl: 1 .  omega-3 acid ethyl esters (LOVAZA) 1 g capsule, Take 2 capsules (2 g total) by mouth 2 (two) times daily., Disp: 360 capsule, Rfl: 1 .  omeprazole (PRILOSEC) 40 MG capsule, TAKE 1 CAPSULE (40 MG TOTAL)  BY MOUTH DAILY., Disp: 90 capsule, Rfl: 1 .  pregabalin (LYRICA) 300 MG capsule, Take 2 capsules (600 mg total) by mouth daily., Disp: 180 capsule, Rfl: 1 .  ranitidine (ZANTAC) 150 MG tablet, Take 150 mg by mouth at bedtime., Disp: , Rfl:  .  RELION INSULIN SYRINGE 1ML/31G 31G X 5/16" 1 ML MISC, , Disp: , Rfl:  .  VENTOLIN HFA 108 (90 Base) MCG/ACT inhaler, Inhale 2 puffs into the lungs every 6 (six) hours as needed., Disp: , Rfl:  .  vitamin C (ASCORBIC ACID) 500 MG tablet, Take 500 mg by mouth daily., Disp: , Rfl:  .  VITAMIN D, CHOLECALCIFEROL, PO, Take 20,000 Units by mouth daily., Disp: , Rfl:    Allergies:  Codeine; Atorvastatin; Latex; and Zolpidem  Review of Systems: Gen:  Denies  fever, sweats, chills HEENT: Denies blurred vision, double vision. bleeds, sore throat Cvc:  No dizziness, chest pain. Resp:   Denies cough or sputum production, shortness of breath Gi: Denies swallowing difficulty, stomach pain. Gu:  Denies bladder incontinence, burning urine Ext:   No Joint pain, stiffness. Skin: No skin rash,  hives  Endoc:  No polyuria, polydipsia. Psych: No depression, insomnia. Other:  All other systems were reviewed with the patient and were negative other that what is mentioned in the HPI.   Physical Examination:   VS: BP (!) 162/74 (BP Location: Right Arm, Cuff Size: Normal)   Pulse 93   Resp 16   Ht _0  (1.575 m)   Wt 247 lb (112 kg)   SpO2 97%   BMI 45.18 kg/m   General Appearance: No distress, morbidly obese. Walks with severe limp.  Neuro:without focal findings,  speech normal. HEENT: PERRLA, EOM intact.   Pulmonary: normal breath sounds, No wheezing. Decreased air entry bilaterally.  CardiovascularNormal S1,S2.  No m/r/g.   Abdomen: Benign, Soft, non-tender. Renal:  No costovertebral tenderness  GU:  No performed at this time. Endoc: No evident thyromegaly, no signs of acromegaly. Skin:   warm, no rashes, no ecchymosis  Extremities: normal, no cyanosis,  clubbing.  Other findings:    LABORATORY PANEL:   CBC No results for input(s): WBC, HGB, HCT, PLT in the last 168 hours. ------------------------------------------------------------------------------------------------------------------  Chemistries  No results for input(s): NA, K, CL, CO2, GLUCOSE, BUN, CREATININE, CALCIUM, MG, AST, ALT, ALKPHOS, BILITOT in the last 168 hours.  Invalid input(s): GFRCGP ------------------------------------------------------------------------------------------------------------------  Cardiac Enzymes No results for input(s): TROPONINI in the last 168 hours. ------------------------------------------------------------  RADIOLOGY:  No results found.     Thank  you for the consultation and for allowing [facility] to assist in the care of your patient. Our recommendations are noted above.  Please contact us if we can be of further service.   Marda Stalker, MD.  Board Certified in Internal Medicine, Pulmonary Medicine, [place], and Sleep Medicine.  [facility] Office  Number: [phone]  Patricia Pesa, M.D.  Merton Border, M.D  07/13/2017

## 2017-07-14 ENCOUNTER — Other Ambulatory Visit: Payer: Self-pay | Admitting: Family Medicine

## 2017-07-14 DIAGNOSIS — G44229 Chronic tension-type headache, not intractable: Secondary | ICD-10-CM

## 2017-07-14 DIAGNOSIS — G4709 Other insomnia: Secondary | ICD-10-CM

## 2017-07-16 ENCOUNTER — Ambulatory Visit: Payer: Commercial Managed Care - HMO | Admitting: Internal Medicine

## 2017-07-16 ENCOUNTER — Ambulatory Visit
Admission: RE | Admit: 2017-07-16 | Discharge: 2017-07-16 | Disposition: A | Discharge status: Home / Self Care | Payer: Commercial Managed Care - HMO | Source: Ambulatory Visit | Attending: Internal Medicine | Admitting: Internal Medicine

## 2017-07-16 ENCOUNTER — Telehealth: Payer: Self-pay | Admitting: *Deleted

## 2017-07-16 ENCOUNTER — Encounter: Payer: Self-pay | Admitting: Internal Medicine

## 2017-07-16 VITALS — BP 162/74 | HR 93 | Resp 16 | Ht 62.0 in | Wt 247.0 lb

## 2017-07-16 DIAGNOSIS — R0602 Shortness of breath: Secondary | ICD-10-CM

## 2017-07-16 DIAGNOSIS — R2681 Unsteadiness on feet: Secondary | ICD-10-CM

## 2017-07-16 DIAGNOSIS — G4733 Obstructive sleep apnea (adult) (pediatric): Secondary | ICD-10-CM | POA: Diagnosis not present

## 2017-07-16 DIAGNOSIS — J4540 Moderate persistent asthma, uncomplicated: Secondary | ICD-10-CM | POA: Diagnosis not present

## 2017-07-16 DIAGNOSIS — I700 Atherosclerosis of aorta: Secondary | ICD-10-CM | POA: Insufficient documentation

## 2017-07-16 DIAGNOSIS — R05 Cough: Secondary | ICD-10-CM | POA: Diagnosis not present

## 2017-07-16 NOTE — Patient Instructions (Addendum)
--  Continue advair and lasix daily.  --try to use CPAP every night the goal is to wear it for the entire night.  --will refer to PT for unstable gait.

## 2017-07-16 NOTE — Telephone Encounter (Signed)
Patient requesting refill of Amitriptyline to [facility].

## 2017-07-16 NOTE — Telephone Encounter (Signed)
Pt states it has been 3 weeks and she is calling for the status of her diabetic shoes.

## 2017-07-19 ENCOUNTER — Telehealth: Payer: Self-pay | Admitting: Family Medicine

## 2017-07-19 ENCOUNTER — Other Ambulatory Visit: Payer: Self-pay

## 2017-07-19 DIAGNOSIS — E1142 Type 2 diabetes mellitus with diabetic polyneuropathy: Secondary | ICD-10-CM

## 2017-07-19 DIAGNOSIS — E1122 Type 2 diabetes mellitus with diabetic chronic kidney disease: Secondary | ICD-10-CM | POA: Diagnosis not present

## 2017-07-19 DIAGNOSIS — I129 Hypertensive chronic kidney disease with stage 1 through stage 4 chronic kidney disease, or unspecified chronic kidney disease: Secondary | ICD-10-CM | POA: Diagnosis not present

## 2017-07-19 DIAGNOSIS — M19079 Primary osteoarthritis, unspecified ankle and foot: Secondary | ICD-10-CM | POA: Diagnosis not present

## 2017-07-19 DIAGNOSIS — E1169 Type 2 diabetes mellitus with other specified complication: Secondary | ICD-10-CM

## 2017-07-19 DIAGNOSIS — G4733 Obstructive sleep apnea (adult) (pediatric): Secondary | ICD-10-CM | POA: Diagnosis not present

## 2017-07-19 DIAGNOSIS — E785 Hyperlipidemia, unspecified: Principal | ICD-10-CM

## 2017-07-19 DIAGNOSIS — Z6841 Body Mass Index (BMI) 40.0 and over, adult: Secondary | ICD-10-CM | POA: Diagnosis not present

## 2017-07-19 DIAGNOSIS — J4540 Moderate persistent asthma, uncomplicated: Secondary | ICD-10-CM | POA: Diagnosis not present

## 2017-07-19 DIAGNOSIS — E11610 Type 2 diabetes mellitus with diabetic neuropathic arthropathy: Secondary | ICD-10-CM | POA: Diagnosis not present

## 2017-07-19 MED ORDER — PREGABALIN 300 MG PO CAPS: 600.0000 mg | ORAL_CAPSULE | Freq: Every day | ORAL | 1 refills | Status: DC

## 2017-07-19 NOTE — Telephone Encounter (Signed)
Medication Refill was sent to Dr.Sowles.

## 2017-07-19 NOTE — Telephone Encounter (Signed)
Copied from [facility] #[id number]. Topic: Quick Communication - See Telephone Encounter >> Jul 19, 2017 10:41 AM Ahmed Prima L wrote: CRM for notification. See Telephone encounter for:  Pt needs a refill on her LYRICA sent to [facility]. Fax # [phone].  07/19/17.

## 2017-07-19 NOTE — Telephone Encounter (Signed)
Refill request for general medication: Lyrica  Last office visit: 05/18/2017  Last physical exam: 02/22/2016  Follow up visit: None indicated

## 2017-07-24 DIAGNOSIS — E1142 Type 2 diabetes mellitus with diabetic polyneuropathy: Secondary | ICD-10-CM | POA: Diagnosis not present

## 2017-07-24 DIAGNOSIS — G4733 Obstructive sleep apnea (adult) (pediatric): Secondary | ICD-10-CM | POA: Diagnosis not present

## 2017-07-24 DIAGNOSIS — M19079 Primary osteoarthritis, unspecified ankle and foot: Secondary | ICD-10-CM | POA: Diagnosis not present

## 2017-07-24 DIAGNOSIS — J4540 Moderate persistent asthma, uncomplicated: Secondary | ICD-10-CM | POA: Diagnosis not present

## 2017-07-24 DIAGNOSIS — E11610 Type 2 diabetes mellitus with diabetic neuropathic arthropathy: Secondary | ICD-10-CM | POA: Diagnosis not present

## 2017-07-24 DIAGNOSIS — E1122 Type 2 diabetes mellitus with diabetic chronic kidney disease: Secondary | ICD-10-CM | POA: Diagnosis not present

## 2017-07-24 DIAGNOSIS — Z6841 Body Mass Index (BMI) 40.0 and over, adult: Secondary | ICD-10-CM | POA: Diagnosis not present

## 2017-07-24 DIAGNOSIS — I129 Hypertensive chronic kidney disease with stage 1 through stage 4 chronic kidney disease, or unspecified chronic kidney disease: Secondary | ICD-10-CM | POA: Diagnosis not present

## 2017-07-26 DIAGNOSIS — J0181 Other acute recurrent sinusitis: Secondary | ICD-10-CM | POA: Diagnosis not present

## 2017-07-26 DIAGNOSIS — B370 Candidal stomatitis: Secondary | ICD-10-CM | POA: Diagnosis not present

## 2017-07-31 DIAGNOSIS — I152 Hypertension secondary to endocrine disorders: Secondary | ICD-10-CM | POA: Diagnosis not present

## 2017-07-31 DIAGNOSIS — G4733 Obstructive sleep apnea (adult) (pediatric): Secondary | ICD-10-CM | POA: Diagnosis not present

## 2017-07-31 DIAGNOSIS — G4700 Insomnia, unspecified: Secondary | ICD-10-CM | POA: Diagnosis not present

## 2017-08-01 DIAGNOSIS — E1142 Type 2 diabetes mellitus with diabetic polyneuropathy: Secondary | ICD-10-CM | POA: Diagnosis not present

## 2017-08-01 DIAGNOSIS — M19079 Primary osteoarthritis, unspecified ankle and foot: Secondary | ICD-10-CM | POA: Diagnosis not present

## 2017-08-01 DIAGNOSIS — E11610 Type 2 diabetes mellitus with diabetic neuropathic arthropathy: Secondary | ICD-10-CM | POA: Diagnosis not present

## 2017-08-01 DIAGNOSIS — J4540 Moderate persistent asthma, uncomplicated: Secondary | ICD-10-CM | POA: Diagnosis not present

## 2017-08-01 DIAGNOSIS — Z6841 Body Mass Index (BMI) 40.0 and over, adult: Secondary | ICD-10-CM | POA: Diagnosis not present

## 2017-08-01 DIAGNOSIS — G4733 Obstructive sleep apnea (adult) (pediatric): Secondary | ICD-10-CM | POA: Diagnosis not present

## 2017-08-01 DIAGNOSIS — I129 Hypertensive chronic kidney disease with stage 1 through stage 4 chronic kidney disease, or unspecified chronic kidney disease: Secondary | ICD-10-CM | POA: Diagnosis not present

## 2017-08-01 DIAGNOSIS — E1122 Type 2 diabetes mellitus with diabetic chronic kidney disease: Secondary | ICD-10-CM | POA: Diagnosis not present

## 2017-08-08 DIAGNOSIS — M19079 Primary osteoarthritis, unspecified ankle and foot: Secondary | ICD-10-CM | POA: Diagnosis not present

## 2017-08-08 DIAGNOSIS — I129 Hypertensive chronic kidney disease with stage 1 through stage 4 chronic kidney disease, or unspecified chronic kidney disease: Secondary | ICD-10-CM | POA: Diagnosis not present

## 2017-08-08 DIAGNOSIS — E1142 Type 2 diabetes mellitus with diabetic polyneuropathy: Secondary | ICD-10-CM | POA: Diagnosis not present

## 2017-08-08 DIAGNOSIS — J4540 Moderate persistent asthma, uncomplicated: Secondary | ICD-10-CM | POA: Diagnosis not present

## 2017-08-08 DIAGNOSIS — E1122 Type 2 diabetes mellitus with diabetic chronic kidney disease: Secondary | ICD-10-CM | POA: Diagnosis not present

## 2017-08-08 DIAGNOSIS — Z6841 Body Mass Index (BMI) 40.0 and over, adult: Secondary | ICD-10-CM | POA: Diagnosis not present

## 2017-08-08 DIAGNOSIS — E11610 Type 2 diabetes mellitus with diabetic neuropathic arthropathy: Secondary | ICD-10-CM | POA: Diagnosis not present

## 2017-08-08 DIAGNOSIS — G4733 Obstructive sleep apnea (adult) (pediatric): Secondary | ICD-10-CM | POA: Diagnosis not present

## 2017-08-09 ENCOUNTER — Ambulatory Visit (INDEPENDENT_AMBULATORY_CARE_PROVIDER_SITE_OTHER): Payer: Medicare PPO | Admitting: Podiatry

## 2017-08-09 ENCOUNTER — Encounter: Payer: Self-pay | Admitting: Podiatry

## 2017-08-09 DIAGNOSIS — E11610 Type 2 diabetes mellitus with diabetic neuropathic arthropathy: Secondary | ICD-10-CM

## 2017-08-09 DIAGNOSIS — M1460 Charcot's joint, unspecified site: Secondary | ICD-10-CM | POA: Diagnosis not present

## 2017-08-09 DIAGNOSIS — M2032 Hallux varus (acquired), left foot: Secondary | ICD-10-CM

## 2017-08-09 DIAGNOSIS — M19072 Primary osteoarthritis, left ankle and foot: Secondary | ICD-10-CM | POA: Diagnosis not present

## 2017-08-09 NOTE — Progress Notes (Signed)
This patient presents the office concerned about the right discoloration noted on the top of her toe.  This patient is diabetic and has previously had a Charcot foot and this has led to a severe unilateral pes planus of the left foot. She says she is not having any pain or discomfort in her toe, but since she is diabetic. She is concerned about the discoloration.  She presents the office today for an evaluation of this left big toe.     GENERAL APPEARANCE: Alert, conversant. Appropriately groomed. No acute distress.  VASCULAR: Pedal pulses are  palpable at  [facility] and PT bilateral.  Capillary refill time is immediate to all digits,  Normal temperature gradient.   NEUROLOGIC: sensation is diminished  to 5.07 monofilament at 5/5 sites bilateral.  Light touch is intact bilateral, Muscle strength normal.  MUSCULOSKELETAL: acceptable muscle strength, tone and stability bilateral.  Intrinsic muscluature intact bilateral.  Rectus appearance of foot and digits noted bilateral.  Unilateral pes planus secondary to Charcot foot left foot.  Significant prominence noted at the medial aspect of the rear foot.  Hallux malleus deformity noted at the IPJ of the left hallux. greater than the right hallux  DERMATOLOGIC: skin color, texture, and turgor are within normal limits.  No preulcerative lesions or ulcers  are seen, no interdigital maceration noted.  No open lesions present.  Digital nails are asymptomatic. No drainage noted.significant number of red lesions noted on the dorsum of the left foot.  These resemble a vasculitis to the left foot  Hallux malleus left hallux  B/L.  Unilateral pes planus   ROV  Valuated her left hallux and determined there was a hallux malleus.  At the level of the IPJ. It was significantly wide but that was due to the position of the malleus.  Examination of the distal aspect of the left hallux reveals immediate capillary return, left hallux.  Told this patient thatshe should no longer be  concerned about that white area.  She says that she is unable to get her foot up and she is unable to see the tips of her toe enough cannot see that redness at the tip of the toe.  RTC prn   Gardiner Barefoot DPM

## 2017-08-10 DIAGNOSIS — E1122 Type 2 diabetes mellitus with diabetic chronic kidney disease: Secondary | ICD-10-CM | POA: Diagnosis not present

## 2017-08-10 DIAGNOSIS — E1142 Type 2 diabetes mellitus with diabetic polyneuropathy: Secondary | ICD-10-CM | POA: Diagnosis not present

## 2017-08-10 DIAGNOSIS — G4733 Obstructive sleep apnea (adult) (pediatric): Secondary | ICD-10-CM | POA: Diagnosis not present

## 2017-08-10 DIAGNOSIS — E11610 Type 2 diabetes mellitus with diabetic neuropathic arthropathy: Secondary | ICD-10-CM | POA: Diagnosis not present

## 2017-08-10 DIAGNOSIS — J4540 Moderate persistent asthma, uncomplicated: Secondary | ICD-10-CM | POA: Diagnosis not present

## 2017-08-10 DIAGNOSIS — I129 Hypertensive chronic kidney disease with stage 1 through stage 4 chronic kidney disease, or unspecified chronic kidney disease: Secondary | ICD-10-CM | POA: Diagnosis not present

## 2017-08-10 DIAGNOSIS — Z6841 Body Mass Index (BMI) 40.0 and over, adult: Secondary | ICD-10-CM | POA: Diagnosis not present

## 2017-08-10 DIAGNOSIS — M19079 Primary osteoarthritis, unspecified ankle and foot: Secondary | ICD-10-CM | POA: Diagnosis not present

## 2017-08-13 DIAGNOSIS — M19079 Primary osteoarthritis, unspecified ankle and foot: Secondary | ICD-10-CM | POA: Diagnosis not present

## 2017-08-13 DIAGNOSIS — G4733 Obstructive sleep apnea (adult) (pediatric): Secondary | ICD-10-CM | POA: Diagnosis not present

## 2017-08-13 DIAGNOSIS — J4540 Moderate persistent asthma, uncomplicated: Secondary | ICD-10-CM | POA: Diagnosis not present

## 2017-08-13 DIAGNOSIS — E1122 Type 2 diabetes mellitus with diabetic chronic kidney disease: Secondary | ICD-10-CM | POA: Diagnosis not present

## 2017-08-13 DIAGNOSIS — E11610 Type 2 diabetes mellitus with diabetic neuropathic arthropathy: Secondary | ICD-10-CM | POA: Diagnosis not present

## 2017-08-13 DIAGNOSIS — Z6841 Body Mass Index (BMI) 40.0 and over, adult: Secondary | ICD-10-CM | POA: Diagnosis not present

## 2017-08-13 DIAGNOSIS — E1142 Type 2 diabetes mellitus with diabetic polyneuropathy: Secondary | ICD-10-CM | POA: Diagnosis not present

## 2017-08-13 DIAGNOSIS — I129 Hypertensive chronic kidney disease with stage 1 through stage 4 chronic kidney disease, or unspecified chronic kidney disease: Secondary | ICD-10-CM | POA: Diagnosis not present

## 2017-08-16 ENCOUNTER — Other Ambulatory Visit: Payer: Self-pay | Admitting: Family Medicine

## 2017-08-21 ENCOUNTER — Encounter: Payer: Self-pay | Admitting: Family Medicine

## 2017-08-21 ENCOUNTER — Ambulatory Visit (INDEPENDENT_AMBULATORY_CARE_PROVIDER_SITE_OTHER): Payer: Medicare PPO | Admitting: Family Medicine

## 2017-08-21 VITALS — BP 130/62 | HR 96 | Resp 16 | Ht 62.0 in | Wt 239.4 lb

## 2017-08-21 DIAGNOSIS — E669 Obesity, unspecified: Secondary | ICD-10-CM

## 2017-08-21 DIAGNOSIS — E785 Hyperlipidemia, unspecified: Secondary | ICD-10-CM | POA: Diagnosis not present

## 2017-08-21 DIAGNOSIS — M25561 Pain in right knee: Secondary | ICD-10-CM

## 2017-08-21 DIAGNOSIS — E1165 Type 2 diabetes mellitus with hyperglycemia: Secondary | ICD-10-CM

## 2017-08-21 DIAGNOSIS — I10 Essential (primary) hypertension: Secondary | ICD-10-CM

## 2017-08-21 DIAGNOSIS — I129 Hypertensive chronic kidney disease with stage 1 through stage 4 chronic kidney disease, or unspecified chronic kidney disease: Secondary | ICD-10-CM | POA: Diagnosis not present

## 2017-08-21 DIAGNOSIS — E1169 Type 2 diabetes mellitus with other specified complication: Secondary | ICD-10-CM | POA: Diagnosis not present

## 2017-08-21 DIAGNOSIS — M19079 Primary osteoarthritis, unspecified ankle and foot: Secondary | ICD-10-CM | POA: Diagnosis not present

## 2017-08-21 DIAGNOSIS — E1122 Type 2 diabetes mellitus with diabetic chronic kidney disease: Secondary | ICD-10-CM | POA: Diagnosis not present

## 2017-08-21 DIAGNOSIS — E1140 Type 2 diabetes mellitus with diabetic neuropathy, unspecified: Secondary | ICD-10-CM | POA: Diagnosis not present

## 2017-08-21 DIAGNOSIS — M25562 Pain in left knee: Secondary | ICD-10-CM

## 2017-08-21 DIAGNOSIS — G4733 Obstructive sleep apnea (adult) (pediatric): Secondary | ICD-10-CM | POA: Diagnosis not present

## 2017-08-21 DIAGNOSIS — E1142 Type 2 diabetes mellitus with diabetic polyneuropathy: Secondary | ICD-10-CM | POA: Diagnosis not present

## 2017-08-21 DIAGNOSIS — J4540 Moderate persistent asthma, uncomplicated: Secondary | ICD-10-CM | POA: Diagnosis not present

## 2017-08-21 DIAGNOSIS — Z6841 Body Mass Index (BMI) 40.0 and over, adult: Secondary | ICD-10-CM | POA: Diagnosis not present

## 2017-08-21 DIAGNOSIS — E11610 Type 2 diabetes mellitus with diabetic neuropathic arthropathy: Secondary | ICD-10-CM | POA: Diagnosis not present

## 2017-08-21 MED ORDER — PREGABALIN 300 MG PO CAPS: 600.0000 mg | ORAL_CAPSULE | Freq: Every day | ORAL | 1 refills | Status: DC

## 2017-08-21 MED ORDER — ACETAMINOPHEN ER 650 MG PO TBCR: 650.0000 mg | EXTENDED_RELEASE_TABLET | Freq: Three times a day (TID) | ORAL | 0 refills | Status: DC | PRN

## 2017-08-21 NOTE — Patient Instructions (Signed)
[facility] to see if they have silver sneakers [facility] locally is also a good option

## 2017-08-21 NOTE — Progress Notes (Signed)
Name: Summer [name]   MRN: [id number]    DOB: 10-28-1950   Date:08/21/2017       Progress Note  Subjective  Chief Complaint  Chief Complaint  Patient presents with  . Leg Pain    HPI  Pain on both popliteal fossa: she was seen by Dr. Marry Guan back in October 12/2016 and diagnosed with a strain on left knee, was given Meloxicam and advised to ice it. She also had home PT and was doing well, she was walking using only a cane, however about 6 days ago developed pain behind both knees, pain is described as intense 10/10 , intermittent, only when she walks, able to stand without pain. No effusion of knee or redness, she has been using a walker now to assist with her gait. No change in activity level prior. She has not been taking NSAID's - orally, she still has been using topical voltaren but does not seem to be helping much, ice seems to help  [facility] left [facility] done at [facility] showed mild patellofemoral arthritis, but no joing arthritis.   HTN: bp at home goes from normal to high and she is not sure what is causing , states takes medication as prescribed, she is not sure if bp goes up when she is in pain, advised to observe and see if correlates. BP usually 120's/70's, 140's-170's over the past few days   DM: glucose has been up also, seeing endocrinologist, she states seen by ENT and has a fungal infection and is getting treated for it, advised to monitor glucose and take it to Endo. Last hgbA1C 05/2017 was elevated at 9.4%, she has polydipsia , polyphagia and polyuria.   Dyslipidemia: she has not sure if she is taking Pravastatin or Rosuvastatin, she will call back, advised to always take all medications to all providers.   Obesity: discussed importance of weight loss to decrease lower extremity pain, she will try to enroll to a gym through [employer].   Patient Active Problem List   Diagnosis Date Noted  . Hyperlipidemia due to type 2 diabetes mellitus ([place]) 06/14/2017  . Chest pain  of unknown etiology 10/06/2016  . Essential hypertension 10/06/2016  . Morbid obesity with BMI of 40.0-44.9, adult ([place]) 10/06/2016  . Dyslipidemia associated with type 2 diabetes mellitus ([place]) 08/18/2016  . Charcot foot due to diabetes mellitus ([place]) 02/22/2016  . Hypertriglyceridemia 07/27/2015  . Acquired abduction deformity of foot 07/12/2015  . Osteoarthritis of subtalar joint 07/12/2015  . Polyneuropathy in diseases classified elsewhere ([place]) 07/12/2015  . Arthritis of foot, degenerative 07/12/2015  . Carpal tunnel syndrome 04/17/2015  . Chronic constipation 04/17/2015  . Insomnia, persistent 04/17/2015  . Chronic kidney disease (CKD), stage III (moderate) ([place]) 04/17/2015  . Decreased exercise tolerance 04/17/2015  . Diabetes mellitus with polyneuropathy ([place]) 04/17/2015  . Gastro-esophageal reflux disease without esophagitis 04/17/2015  . Bursitis, trochanteric 04/17/2015  . Cephalalgia 04/17/2015  . Benign hypertension 04/17/2015  . Adult hypothyroidism 04/17/2015  . Hearing loss 04/17/2015  . Chronic recurrent major depressive disorder ([place]) 04/17/2015  . Neurogenic claudication 04/17/2015  . Obesity (BMI 35.0-39.9 without comorbidity) 04/17/2015  . Hypo-ovarianism 04/17/2015  . Perennial allergic rhinitis with seasonal variation 04/17/2015  . Acne erythematosa 04/17/2015  . Dyskinesia, tardive 04/17/2015  . Memory loss 04/17/2015  . Impingement syndrome of shoulder 04/17/2015  . Dermatitis, stasis 04/17/2015  . Obstructive sleep apnea 05/14/2014  . Dyspnea 05/06/2014  . Hyperlipidemia 02/06/2012  . LBP (low back pain) 09/16/2008  Social History   Tobacco Use  . Smoking status: Never Smoker  . Smokeless tobacco: Never Used  Substance Use Topics  . Alcohol use: No    Alcohol/week: 0.0 - 0.6 oz    Comment: occasionally     Current Outpatient Medications:  .  clotrimazole (MYCELEX) 10 MG troche, , Disp: , Rfl:  .  doxycycline (VIBRAMYCIN) 50 MG capsule, ,  Disp: , Rfl:  .  fluconazole (DIFLUCAN) 100 MG tablet, , Disp: , Rfl:  .  pravastatin (PRAVACHOL) 80 MG tablet, Take 1 tablet by mouth daily., Disp: , Rfl:  .  amitriptyline (ELAVIL) 50 MG tablet, TAKE 1/2 TO 1 TABLET AT BEDTIME, Disp: 90 tablet, Rfl: 0 .  ASPIRIN LOW DOSE 81 MG EC tablet, TAKE 1 TABLET (81 MG TOTAL) BY MOUTH DAILY., Disp: 30 tablet, Rfl: 0 .  Blood Glucose Monitoring Suppl (ACCU-CHEK AVIVA PLUS) w/Device KIT, , Disp: , Rfl:  .  diclofenac sodium (VOLTAREN) 1 % GEL, Apply topically as needed. Reported on 02/22/2016, Disp: , Rfl:  .  Fluticasone-Salmeterol 232-14 MCG/ACT AEPB, Inhale 2 puffs into the lungs 2 (two) times daily. Rinse mouth after use., Disp: 1 each, Rfl: 5 .  furosemide (LASIX) 20 MG tablet, Take 1 tablet (20 mg total) by mouth every morning., Disp: 15 tablet, Rfl: 1 .  Glucose Blood (BLOOD GLUCOSE TEST STRIPS) STRP, , Disp: , Rfl:  .  Insulin Glargine (LANTUS SOLOSTAR) 100 UNIT/ML Solostar Pen, Inject 50 Units into the skin daily at 10 pm. (Patient taking differently: Inject 68 Units daily at 10 pm into the skin. ), Disp: 15 mL, Rfl: 2 .  Insulin Pen Needle (RELION PEN NEEDLE 31G/8MM) 31G X 8 MM MISC, by Does not apply route 2 (two) times daily., Disp: , Rfl:  .  insulin regular (NOVOLIN R,HUMULIN R) 100 units/mL injection, Inject 0.1 mLs (10 Units total) into the skin 3 (three) times daily before meals. (Patient taking differently: Inject 36 Units 3 (three) times daily before meals into the skin. ), Disp: 10 mL, Rfl: 0 .  levothyroxine (SYNTHROID, LEVOTHROID) 25 MCG tablet, TAKE 1 TABLET EVERY DAY  EXCEPT TAKE 2 TABLETS  ON  SUNDAYS, Disp: 100 tablet, Rfl: 0 .  losartan-hydrochlorothiazide (HYZAAR) 100-12.5 MG tablet, Take 1 tablet by mouth daily., Disp: 90 tablet, Rfl: 1 .  metFORMIN (GLUCOPHAGE-XR) 500 MG 24 hr tablet, Take 1 tablet (500 mg total) by mouth daily with breakfast., Disp: 180 tablet, Rfl: 1 .  metoprolol tartrate (LOPRESSOR) 25 MG tablet, Take 1 tablet  (25 mg total) by mouth 2 (two) times daily., Disp: 180 tablet, Rfl: 1 .  montelukast (SINGULAIR) 10 MG tablet, TAKE 1 TABLET AT BEDTIME, Disp: 90 tablet, Rfl: 1 .  omeprazole (PRILOSEC) 40 MG capsule, TAKE 1 CAPSULE (40 MG TOTAL) BY MOUTH DAILY., Disp: 90 capsule, Rfl: 1 .  pregabalin (LYRICA) 300 MG capsule, Take 2 capsules (600 mg total) daily by mouth., Disp: 180 capsule, Rfl: 1 .  ranitidine (ZANTAC) 150 MG tablet, Take 150 mg by mouth at bedtime., Disp: , Rfl:  .  RELION INSULIN SYRINGE 1ML/31G 31G X 5/16" 1 ML MISC, , Disp: , Rfl:  .  VENTOLIN HFA 108 (90 Base) MCG/ACT inhaler, Inhale 2 puffs into the lungs every 6 (six) hours as needed., Disp: , Rfl:  .  vitamin C (ASCORBIC ACID) 500 MG tablet, Take 500 mg by mouth daily., Disp: , Rfl:  .  VITAMIN D, CHOLECALCIFEROL, PO, Take 20,000 Units by mouth daily., Disp: ,  Rfl:   Allergies  Allergen Reactions  . Codeine Other (See Comments)    "TRIPPED OUT"  DIDN'T LIKE THE [place]  . Atorvastatin     muscle pain  . Latex Rash  . Zolpidem Other (See Comments)    Sleep walk    ROS  Ten systems reviewed and is negative except as mentioned in HPI    Objective  Vitals:   08/21/17 1247  BP: 130/62  Pulse: 96  Resp: 16  SpO2: 98%  Weight: 239 lb 6.4 oz (108.6 kg)  Height: '5\' 2"'$  (1.575 m)    Body mass index is 43.79 kg/m.    Physical Exam  Constitutional: Patient appears well-developed and well-nourished. Obese  No distress.  HEENT: head atraumatic, normocephalic, pupils equal and reactive to light, neck supple, throat within normal limits Cardiovascular: Normal rate, regular rhythm and normal heart sounds.  No murmur heard. Trace  BLE edema. Pulmonary/Chest: Effort normal and breath sounds normal. No respiratory distress. Abdominal: Soft.  There is no tenderness. Psychiatric: Patient has a normal mood and affect. behavior is normal. Judgment and thought content normal. Muscular Skeletal: she has mild effusion on left  anterior knee, pain during palpation of popliteal fossa bilaterally. No erythema of increase in warmth   Assessment & Plan  1. Pain in both knees, unspecified chronicity  - acetaminophen (TYLENOL 8 HOUR) 650 MG CR tablet; Take 1 tablet (650 mg total) by mouth every 8 (eight) hours as needed for pain.  Dispense: 90 tablet; Refill: 0  2. Obesity (BMI 35.0-39.9 without comorbidity)  Discussed with the patient the risk posed by an increased BMI. Discussed importance of portion control, calorie counting and at least 150 minutes of physical activity weekly. Avoid sweet beverages and drink more water. Eat at least 6 servings of fruit and vegetables daily   3. Benign hypertension  Monitor bp and see if related to pain   4. Dyslipidemia associated with type 2 diabetes mellitus ([place])  Please call back with the name of statin that you are taking   5. Poorly controlled type 2 diabetes mellitus with neuropathy ([name])  - pregabalin (LYRICA) 300 MG capsule; Take 2 capsules (600 mg total) by mouth daily.  Dispense: 180 capsule; Refill: 1

## 2017-08-27 DIAGNOSIS — I5032 Chronic diastolic (congestive) heart failure: Secondary | ICD-10-CM | POA: Diagnosis not present

## 2017-08-27 DIAGNOSIS — J069 Acute upper respiratory infection, unspecified: Secondary | ICD-10-CM | POA: Diagnosis not present

## 2017-08-27 DIAGNOSIS — R0789 Other chest pain: Secondary | ICD-10-CM | POA: Diagnosis not present

## 2017-08-27 DIAGNOSIS — J40 Bronchitis, not specified as acute or chronic: Secondary | ICD-10-CM | POA: Diagnosis not present

## 2017-08-27 DIAGNOSIS — R0602 Shortness of breath: Secondary | ICD-10-CM | POA: Diagnosis not present

## 2017-08-27 DIAGNOSIS — I110 Hypertensive heart disease with heart failure: Secondary | ICD-10-CM | POA: Diagnosis not present

## 2017-08-27 DIAGNOSIS — G4733 Obstructive sleep apnea (adult) (pediatric): Secondary | ICD-10-CM | POA: Diagnosis not present

## 2017-08-27 DIAGNOSIS — R000 Tachycardia, unspecified: Secondary | ICD-10-CM | POA: Diagnosis not present

## 2017-08-27 DIAGNOSIS — R079 Chest pain, unspecified: Secondary | ICD-10-CM | POA: Diagnosis not present

## 2017-08-27 DIAGNOSIS — R05 Cough: Secondary | ICD-10-CM | POA: Diagnosis not present

## 2017-08-27 DIAGNOSIS — J4541 Moderate persistent asthma with (acute) exacerbation: Secondary | ICD-10-CM | POA: Diagnosis not present

## 2017-08-27 DIAGNOSIS — E1140 Type 2 diabetes mellitus with diabetic neuropathy, unspecified: Secondary | ICD-10-CM | POA: Diagnosis not present

## 2017-08-28 ENCOUNTER — Other Ambulatory Visit: Payer: Self-pay | Admitting: Internal Medicine

## 2017-08-28 DIAGNOSIS — R0600 Dyspnea, unspecified: Secondary | ICD-10-CM | POA: Diagnosis not present

## 2017-08-28 DIAGNOSIS — R079 Chest pain, unspecified: Secondary | ICD-10-CM | POA: Diagnosis not present

## 2017-08-28 DIAGNOSIS — I5030 Unspecified diastolic (congestive) heart failure: Secondary | ICD-10-CM | POA: Diagnosis not present

## 2017-08-28 DIAGNOSIS — E119 Type 2 diabetes mellitus without complications: Secondary | ICD-10-CM | POA: Diagnosis not present

## 2017-08-28 MED ORDER — FLUTICASONE-SALMETEROL 230-21 MCG/ACT IN AERO: 2.0000 | INHALATION_SPRAY | Freq: Two times a day (BID) | RESPIRATORY_TRACT | 12 refills | Status: DC

## 2017-08-29 ENCOUNTER — Telehealth: Payer: Self-pay | Admitting: *Deleted

## 2017-08-29 ENCOUNTER — Inpatient Hospital Stay: Payer: Self-pay | Admitting: Family Medicine

## 2017-08-29 NOTE — Telephone Encounter (Signed)
PA initiated for Advair 230/21 Cover my meds key: [id number] Pending decision thru [place].

## 2017-08-30 ENCOUNTER — Other Ambulatory Visit: Payer: Self-pay | Admitting: Family Medicine

## 2017-08-30 DIAGNOSIS — S83412A Sprain of medial collateral ligament of left knee, initial encounter: Secondary | ICD-10-CM | POA: Diagnosis not present

## 2017-08-31 ENCOUNTER — Ambulatory Visit (INDEPENDENT_AMBULATORY_CARE_PROVIDER_SITE_OTHER): Payer: Medicare PPO | Admitting: Family Medicine

## 2017-08-31 ENCOUNTER — Encounter: Payer: Self-pay | Admitting: Family Medicine

## 2017-08-31 VITALS — BP 132/68 | HR 94 | Temp 98.4°F | Resp 16 | Wt 246.1 lb

## 2017-08-31 DIAGNOSIS — I5030 Unspecified diastolic (congestive) heart failure: Secondary | ICD-10-CM | POA: Insufficient documentation

## 2017-08-31 DIAGNOSIS — J4540 Moderate persistent asthma, uncomplicated: Secondary | ICD-10-CM

## 2017-08-31 DIAGNOSIS — I5032 Chronic diastolic (congestive) heart failure: Secondary | ICD-10-CM | POA: Insufficient documentation

## 2017-08-31 DIAGNOSIS — E1140 Type 2 diabetes mellitus with diabetic neuropathy, unspecified: Secondary | ICD-10-CM

## 2017-08-31 DIAGNOSIS — I10 Essential (primary) hypertension: Secondary | ICD-10-CM

## 2017-08-31 DIAGNOSIS — E1165 Type 2 diabetes mellitus with hyperglycemia: Secondary | ICD-10-CM | POA: Diagnosis not present

## 2017-08-31 DIAGNOSIS — G4733 Obstructive sleep apnea (adult) (pediatric): Secondary | ICD-10-CM | POA: Diagnosis not present

## 2017-08-31 DIAGNOSIS — S83412A Sprain of medial collateral ligament of left knee, initial encounter: Secondary | ICD-10-CM | POA: Diagnosis not present

## 2017-08-31 LAB — CBC
HCT: 36.5 % (ref 35.0–45.0)
Hemoglobin: 12.4 g/dL (ref 11.7–15.5)
MCH: 28.7 pg (ref 27.0–33.0)
MCHC: 34 g/dL (ref 32.0–36.0)
MCV: 84.5 fL (ref 80.0–100.0)
MPV: 11.4 fL (ref 7.5–12.5)
Platelets: 271 10*3/uL (ref 140–400)
RBC: 4.32 10*6/uL (ref 3.80–5.10)
RDW: 14.9 % (ref 11.0–15.0)
WBC: 10.2 10*3/uL (ref 3.8–10.8)

## 2017-08-31 LAB — COMPLETE METABOLIC PANEL WITH GFR
AG Ratio: 2.2 (calc) (ref 1.0–2.5)
ALT: 28 U/L (ref 6–29)
AST: 19 U/L (ref 10–35)
Albumin: 4.3 g/dL (ref 3.6–5.1)
Alkaline phosphatase (APISO): 79 U/L (ref 33–130)
BUN/Creatinine Ratio: 25 (calc) — ABNORMAL HIGH (ref 6–22)
BUN: 29 mg/dL — ABNORMAL HIGH (ref 7–25)
CO2: 26 mmol/L (ref 20–32)
Calcium: 10.1 mg/dL (ref 8.6–10.4)
Chloride: 103 mmol/L (ref 98–110)
Creat: 1.17 mg/dL — ABNORMAL HIGH (ref 0.50–0.99)
GFR, Est African American: 55 mL/min/{1.73_m2} — ABNORMAL LOW (ref >=60)
GFR, Est Non African American: 48 mL/min/{1.73_m2} — ABNORMAL LOW (ref >=60)
Globulin: 2 g/dL (calc) (ref 1.9–3.7)
Glucose, Bld: 171 mg/dL — ABNORMAL HIGH (ref 65–99)
Potassium: 4.5 mmol/L (ref 3.5–5.3)
Sodium: 139 mmol/L (ref 135–146)
Total Bilirubin: 0.3 mg/dL (ref 0.2–1.2)
Total Protein: 6.3 g/dL (ref 6.1–8.1)

## 2017-08-31 NOTE — Patient Instructions (Addendum)
-   Please call Dr. Sherren Mocha office regarding your blood sugars.  He wanted you to notify him if your sugars were staying above 200 despite your medications.  - Monitor blood pressure at home and keep a log

## 2017-08-31 NOTE — Telephone Encounter (Signed)
Patient requesting refill of Levothyroxine to [facility].

## 2017-08-31 NOTE — Progress Notes (Addendum)
Name: Summer [name]   MRN: [id number]    DOB: [birth year]   Date:08/31/2017       Progress Note  Subjective  Chief Complaint  Chief Complaint  Patient presents with  . Hospitalization Follow-up    HPI  Patient presents for Hospital Follow Up:  She came in with shortness of breath and substernal chest pressure from trying to breath.  She was admitted overnight at [facility] on 08/27/2017 and discharged 08/28/2017.  Primary diagnosis was asthma exacerbation; also managed for HFpEF and DM while admitted  - Labs while in hospital: very mild anemia, TSH WNL, BMP shows elevated glucose and BUN (baseline for pt), low magnesium, negative troponins and d-dimer  - Sees Dr. Harold Barban in [facility] (ENT) for cough and fungal infection.   - Sees Dr. Ashby Dawes w/ pulmonology  Asthma: Completed Azithromycin, still needed tessalon PRN for cough.  She states is back at her baseline shortness of breath that she has had for several years, relief with rest.  She is taking Advair & Singulair daily and ventolin PRN for shortness of breath.  Was strongly encouraged to wear CPAP at night - is waiting on new machine - says when that is delivered she is going to "give it a shot" and try to wear it consistently.  HFpEF: Has Echo on 07/04/2017 - had normal LVEF with mild diastolic dysfunction; this was not repeated while admitted to the hospital.  She denies LE edema or chest pain.  She takes Lasix, aspirin, and Metoprolol daily.  Has potassium supplement she takes daily.  HTN: At goal today; taking metoprolol, aspirin, and hyzaar for BP control.  Denies headaches, chest pain, palpitations or BLE edema.  She notes her BP does spike when she is stressed out, does not think it's related to her pain.  Has not been checking her BP at home since she got back from the hospital, will start checking daily again.  DM: Managed by Dr. Honor Junes Endocrinologist; glucose has been up (200's) since being home; next appointment is in  January; last appt was 06/14/2017 - she is taking Lantus QHS and Novalin (sliding scale) and was to titrate up on her medication; was supposed to call if BG's remaining above 200, but has not done so - she will call on Monday.  Advised to monitor glucose and take it to Endo. Last hgbA1C 05/2017 was elevated at 9.4%, she has polydipsia , polyphagia and polyuria.  Patient Active Problem List   Diagnosis Date Noted  . Hyperlipidemia due to type 2 diabetes mellitus ([place]) 06/14/2017  . Chest pain of unknown etiology 10/06/2016  . Essential hypertension 10/06/2016  . Morbid obesity with BMI of 40.0-44.9, adult ([place]) 10/06/2016  . Dyslipidemia associated with type 2 diabetes mellitus ([place]) 08/18/2016  . Charcot foot due to diabetes mellitus ([place]) 02/22/2016  . Hypertriglyceridemia 07/27/2015  . Acquired abduction deformity of foot 07/12/2015  . Osteoarthritis of subtalar joint 07/12/2015  . Poorly controlled type 2 diabetes mellitus with neuropathy ([place]) 07/12/2015  . Arthritis of foot, degenerative 07/12/2015  . Carpal tunnel syndrome 04/17/2015  . Chronic constipation 04/17/2015  . Insomnia, persistent 04/17/2015  . Chronic kidney disease (CKD), stage III (moderate) ([place]) 04/17/2015  . Decreased exercise tolerance 04/17/2015  . Diabetes mellitus with polyneuropathy ([place]) 04/17/2015  . Gastro-esophageal reflux disease without esophagitis 04/17/2015  . Bursitis, trochanteric 04/17/2015  . Cephalalgia 04/17/2015  . Benign hypertension 04/17/2015  . Adult hypothyroidism 04/17/2015  . Hearing loss 04/17/2015  . Chronic recurrent major  depressive disorder ([place]) 04/17/2015  . Neurogenic claudication 04/17/2015  . Obesity (BMI 35.0-39.9 without comorbidity) 04/17/2015  . Hypo-ovarianism 04/17/2015  . Perennial allergic rhinitis with seasonal variation 04/17/2015  . Acne erythematosa 04/17/2015  . Dyskinesia, tardive 04/17/2015  . Memory loss 04/17/2015  . Impingement syndrome of shoulder  04/17/2015  . Dermatitis, stasis 04/17/2015  . Obstructive sleep apnea 05/14/2014  . Dyspnea 05/06/2014  . Hyperlipidemia 02/06/2012  . LBP (low back pain) 09/16/2008    Past Surgical History:  Procedure Laterality Date  . ANKLE SURGERY Left approx Jan 2018  . CATARACT EXTRACTION  01/2011   right  . eye lid surgery  2013   bilateral  . FOOT SURGERY    . NECK SURGERY    . TUBAL LIGATION    . VAGINAL HYSTERECTOMY  1989    Family History  Problem Relation Age of Onset  . Heart attack Mother   . Aneurysm Mother     Social History   Socioeconomic History  . Marital status: Widowed    Spouse name: Not on file  . Number of children: Not on file  . Years of education: Not on file  . Highest education level: Not on file  Social Needs  . Financial resource strain: Not on file  . Food insecurity - worry: Not on file  . Food insecurity - inability: Not on file  . Transportation needs - medical: Not on file  . Transportation needs - non-medical: Not on file  Occupational History  . Not on file  Tobacco Use  . Smoking status: Never Smoker  . Smokeless tobacco: Never Used  Substance and Sexual Activity  . Alcohol use: No    Alcohol/week: 0.0 - 0.6 oz    Comment: occasionally  . Drug use: No  . Sexual activity: Yes    Partners: Male  Other Topics Concern  . Not on file  Social History Narrative  . Not on file     Current Outpatient Medications:  .  ACCU-CHEK SOFTCLIX LANCETS lancets, , Disp: , Rfl:  .  acetaminophen (TYLENOL 8 HOUR) 650 MG CR tablet, Take 1 tablet (650 mg total) by mouth every 8 (eight) hours as needed for pain., Disp: 90 tablet, Rfl: 0 .  albuterol (PROVENTIL HFA) 108 (90 Base) MCG/ACT inhaler, Inhale 2 puffs into the lungs every 6 (six) hours as needed., Disp: , Rfl:  .  amitriptyline (ELAVIL) 50 MG tablet, TAKE 1/2 TO 1 TABLET AT BEDTIME, Disp: 90 tablet, Rfl: 0 .  ASPIRIN LOW DOSE 81 MG EC tablet, TAKE 1 TABLET (81 MG TOTAL) BY MOUTH DAILY., Disp:  30 tablet, Rfl: 0 .  azithromycin (ZITHROMAX) 250 MG tablet, Take 1 tablet by mouth daily. For four days, Disp: , Rfl:  .  benzonatate (TESSALON) 100 MG capsule, Take 100 mg by mouth 3 (three) times daily. As needed for cough, Disp: , Rfl:  .  Blood Glucose Monitoring Suppl (ACCU-CHEK AVIVA PLUS) w/Device KIT, , Disp: , Rfl:  .  clotrimazole (MYCELEX) 10 MG troche, , Disp: , Rfl:  .  diclofenac sodium (VOLTAREN) 1 % GEL, Apply topically as needed. Reported on 02/22/2016, Disp: , Rfl:  .  fluticasone-salmeterol (ADVAIR HFA) 230-21 MCG/ACT inhaler, Inhale 2 puffs into the lungs 2 (two) times daily., Disp: 1 Inhaler, Rfl: 12 .  Glucose Blood (BLOOD GLUCOSE TEST STRIPS) STRP, , Disp: , Rfl:  .  Insulin Glargine (LANTUS SOLOSTAR) 100 UNIT/ML Solostar Pen, Inject 50 Units into the skin daily at 10  pm. (Patient taking differently: Inject 68 Units daily at 10 pm into the skin. ), Disp: 15 mL, Rfl: 2 .  Insulin Pen Needle (RELION PEN NEEDLE 31G/8MM) 31G X 8 MM MISC, by Does not apply route 2 (two) times daily., Disp: , Rfl:  .  insulin regular (NOVOLIN R,HUMULIN R) 100 units/mL injection, Inject 0.1 mLs (10 Units total) into the skin 3 (three) times daily before meals. (Patient taking differently: Inject 36 Units 3 (three) times daily before meals into the skin. ), Disp: 10 mL, Rfl: 0 .  levothyroxine (SYNTHROID, LEVOTHROID) 25 MCG tablet, TAKE 1 TABLET EVERY DAY  EXCEPT TAKE 2 TABLETS  ON  SUNDAYS, Disp: 100 tablet, Rfl: 0 .  losartan-hydrochlorothiazide (HYZAAR) 100-12.5 MG tablet, Take 1 tablet by mouth daily., Disp: 90 tablet, Rfl: 1 .  magnesium oxide (MAG-OX) 400 MG tablet, Take 400 mg by mouth daily. For 21 days, Disp: , Rfl:  .  metFORMIN (GLUCOPHAGE-XR) 500 MG 24 hr tablet, Take 1 tablet (500 mg total) by mouth daily with breakfast., Disp: 180 tablet, Rfl: 1 .  metoprolol tartrate (LOPRESSOR) 25 MG tablet, Take 1 tablet (25 mg total) by mouth 2 (two) times daily., Disp: 180 tablet, Rfl: 1 .   montelukast (SINGULAIR) 10 MG tablet, TAKE 1 TABLET AT BEDTIME, Disp: 90 tablet, Rfl: 1 .  omeprazole (PRILOSEC) 40 MG capsule, TAKE 1 CAPSULE (40 MG TOTAL) BY MOUTH DAILY., Disp: 90 capsule, Rfl: 1 .  Potassium 99 MG TABS, Take 1 tablet by mouth daily., Disp: , Rfl:  .  pregabalin (LYRICA) 300 MG capsule, Take 2 capsules (600 mg total) by mouth daily., Disp: 180 capsule, Rfl: 1 .  RELION INSULIN SYRINGE 1ML/31G 31G X 5/16" 1 ML MISC, , Disp: , Rfl:  .  rosuvastatin (CRESTOR) 20 MG tablet, Take 20 mg by mouth daily., Disp: , Rfl:  .  VENTOLIN HFA 108 (90 Base) MCG/ACT inhaler, Inhale 2 puffs into the lungs every 6 (six) hours as needed., Disp: , Rfl:  .  vitamin C (ASCORBIC ACID) 500 MG tablet, Take 500 mg by mouth daily., Disp: , Rfl:  .  VITAMIN D, CHOLECALCIFEROL, PO, Take 20,000 Units by mouth daily., Disp: , Rfl:  .  fluticasone-salmeterol (ADVAIR HFA) 230-21 MCG/ACT inhaler, Inhale 2 puffs into the lungs 2 (two) times daily., Disp: , Rfl:  .  furosemide (LASIX) 20 MG tablet, Take 1 tablet (20 mg total) by mouth every morning., Disp: 15 tablet, Rfl: 1 .  ranitidine (ZANTAC) 150 MG tablet, Take 150 mg by mouth at bedtime., Disp: , Rfl:   Allergies  Allergen Reactions  . Codeine Other (See Comments)    "TRIPPED OUT"  DIDN'T LIKE THE [place]  . Atorvastatin     muscle pain  . Latex Rash  . Zolpidem Other (See Comments)    Sleep walk     ROS  Constitutional: Negative for fever or weight change.  Respiratory: Positive for improving cough; positive for baseline shortness of breath.   Cardiovascular: Negative for chest pain or palpitations.  Gastrointestinal: Negative for abdominal pain, no bowel changes.  Musculoskeletal: Negative for gait problem or joint swelling.  Skin: Negative for rash.  Neurological: Negative for dizziness or headache.  No other specific complaints in a complete review of systems (except as listed in HPI above).  Objective  Vitals:   08/31/17 1439   BP: 132/68  Pulse: 94  Resp: 16  Temp: 98.4 F (36.9 C)  SpO2: 95%  Weight: 246 lb  1.6 oz (111.6 kg)   Body mass index is 45.01 kg/m.  Physical Exam Constitutional: Patient appears well-developed and well-nourished. Obese. No distress.  HEENT: head atraumatic, normocephalic, neck supple, throat within normal limits Cardiovascular: Normal rate, regular rhythm and normal heart sounds.  No murmur heard. No BLE edema. Pulmonary/Chest: Effort normal and breath sounds normal. No respiratory distress. Abdominal: Soft.  There is no tenderness. Psychiatric: Patient has a normal mood and affect. behavior is normal. Judgment and thought content normal.  No results found for this or any previous visit (from the past 2160 hour(s)).  PHQ2/9: Depression screen [facility] 2/9 08/31/2017 06/27/2017 05/18/2017 09/19/2016 08/18/2016  Decreased Interest 0 3 0 1 3  Down, Depressed, Hopeless 0 3 0 1 3  PHQ - 2 Score 0 6 0 2 6  Altered sleeping - 3 - 0 3  Tired, decreased energy - - - 0 3  Change in appetite - 3 - 0 3  Feeling bad or failure about yourself  - 3 - - 3  Trouble concentrating - 3 - 0 3  Moving slowly or fidgety/restless - 3 - 0 0  Suicidal thoughts - 3 - 1 0  PHQ-9 Score - 24 - 3 21  Difficult doing work/chores - Extremely dIfficult - Somewhat difficult Very difficult   Fall Risk: Fall Risk  08/31/2017 08/21/2017 06/27/2017 05/18/2017 02/27/2017  Falls in the past year? No No Yes No No  Comment - - - - -  Number falls in past yr: - - 2 or more - -  Injury with Fall? - - No - -  Risk Factor Category  - - High Fall Risk - -  Comment - - short of breath, diuretics - -  Risk for fall due to : - - Impaired balance/gait;Impaired mobility;Medication side effect - -  Risk for fall due to: Comment - - s/p L ankle repair - -  Follow up - - Education provided;Falls prevention discussed - -   Functional Status Survey: Is the patient deaf or have difficulty hearing?: No Does the patient have  difficulty seeing, even when wearing glasses/contacts?: No Does the patient have difficulty concentrating, remembering, or making decisions?: No Does the patient have difficulty walking or climbing stairs?: Yes Does the patient have difficulty dressing or bathing?: No Does the patient have difficulty doing errands alone such as visiting a doctor's office or shopping?: No  Assessment & Plan  1. Moderate persistent asthma without complication - Continue Advair and Singulair; Ventolin PRN; keep Pulmonology follow up. - CBC and CMP per orders  2. Poorly controlled type 2 diabetes mellitus with neuropathy ([place]) - Call Dr. Sherren Mocha office with BG readings for further instructions. Diabetic diet discussed. - CMP  3. Essential hypertension - Continue medications.  Monitor at home daily. - CMP  4. (HFpEF) heart failure with preserved ejection fraction ([name]) - Continue medications; stable. - CMP  5. Obstructive sleep apnea Start CPAP once delivered.

## 2017-09-05 ENCOUNTER — Other Ambulatory Visit: Payer: Self-pay | Admitting: Internal Medicine

## 2017-09-05 MED ORDER — FLUTICASONE-SALMETEROL 250-50 MCG/DOSE IN AEPB: 1.0000 | INHALATION_SPRAY | Freq: Two times a day (BID) | RESPIRATORY_TRACT | 6 refills | Status: DC

## 2017-09-05 NOTE — Telephone Encounter (Signed)
Contacted patient and informed her insurance will not cover Advair230/21. Informed that Advair Diskus has been sent. Nothing further needed.

## 2017-09-06 DIAGNOSIS — J4541 Moderate persistent asthma with (acute) exacerbation: Secondary | ICD-10-CM | POA: Diagnosis not present

## 2017-09-06 DIAGNOSIS — I110 Hypertensive heart disease with heart failure: Secondary | ICD-10-CM | POA: Diagnosis not present

## 2017-09-06 DIAGNOSIS — I5030 Unspecified diastolic (congestive) heart failure: Secondary | ICD-10-CM | POA: Diagnosis not present

## 2017-09-06 DIAGNOSIS — F329 Major depressive disorder, single episode, unspecified: Secondary | ICD-10-CM | POA: Diagnosis not present

## 2017-09-06 DIAGNOSIS — E1165 Type 2 diabetes mellitus with hyperglycemia: Secondary | ICD-10-CM | POA: Diagnosis not present

## 2017-09-06 DIAGNOSIS — G8929 Other chronic pain: Secondary | ICD-10-CM | POA: Diagnosis not present

## 2017-09-06 DIAGNOSIS — F419 Anxiety disorder, unspecified: Secondary | ICD-10-CM | POA: Diagnosis not present

## 2017-09-06 DIAGNOSIS — E1140 Type 2 diabetes mellitus with diabetic neuropathy, unspecified: Secondary | ICD-10-CM | POA: Diagnosis not present

## 2017-09-06 DIAGNOSIS — G4733 Obstructive sleep apnea (adult) (pediatric): Secondary | ICD-10-CM | POA: Diagnosis not present

## 2017-09-07 ENCOUNTER — Telehealth: Payer: Self-pay | Admitting: *Deleted

## 2017-09-07 ENCOUNTER — Telehealth: Payer: Self-pay | Admitting: Internal Medicine

## 2017-09-07 NOTE — Telephone Encounter (Signed)
Lyda Perone Key: [id number] - PA Case ID: [id number] - Rx #: [id number] Need help? Call us at [phone]  Status  Status pending.

## 2017-09-07 NOTE — Telephone Encounter (Signed)
If Advair is declined patient must try and fail Symbicort/Incruse. Decision on PA within 72 hours.

## 2017-09-07 NOTE — Telephone Encounter (Signed)
Summer [name] from [employer] is calling in reference to inhalers and lower cost inhalers (Simbicort, Advair) Needs to know if they have ever tried or if it is ok to try Please call [phone] Ref # - [id number]

## 2017-09-10 ENCOUNTER — Telehealth: Payer: Self-pay | Admitting: Internal Medicine

## 2017-09-10 NOTE — Telephone Encounter (Signed)
Patient's insurance had denied Advair Diskus 250-50 mcg. Patient must 1st try and fail Symbicort HFA and Breo Ellipta. Please advise.

## 2017-09-12 ENCOUNTER — Other Ambulatory Visit: Payer: Self-pay | Admitting: Internal Medicine

## 2017-09-12 MED ORDER — FLUTICASONE FUROATE-VILANTEROL 200-25 MCG/INH IN AEPB: 1.0000 | INHALATION_SPRAY | Freq: Every day | RESPIRATORY_TRACT | 5 refills | Status: DC

## 2017-09-12 NOTE — Telephone Encounter (Signed)
Placed order for breo.

## 2017-09-12 NOTE — Telephone Encounter (Signed)
Left detailed message of inhaler change and why. Nothing further needed.

## 2017-09-21 ENCOUNTER — Other Ambulatory Visit: Payer: Self-pay

## 2017-09-21 ENCOUNTER — Other Ambulatory Visit: Payer: Self-pay | Admitting: Family Medicine

## 2017-09-21 DIAGNOSIS — I10 Essential (primary) hypertension: Secondary | ICD-10-CM

## 2017-09-21 NOTE — Patient Outreach (Signed)
[facility]) Care Management  09/21/2017  Summer [name] 04-Jan-1951 [id number]  TELEPHONE SCREENING Referral date: 09/20/17 Referral source:[employer] direct referral  Referral reason: Frequent emergency department visits  Insurance: [facility]  Telephone call to patient regarding [employer] direct referral. HIPAA verified with patient. Discussed and offered [facility] care management program with patient. Patient verbally agreed to program. Patient states she has been to the emergency room twice in 1 day within the past 3 weeks. Patient states she went to the emergency room because she has asthma and she had episodes where she felt like, " an elephant was sitting on my chest.,"  Patient state she was given breathing treatment which made her feel better but when she got home she had the episode again. Patient state the emergency room doctors seem to feel it is stress related. Patient state she was given two prescriptions at the emergency department, one being advair and the other was an inhaler but patient unsure of the name. Patient states she was not able to get the prescriptions filled because she cannot afford them. Patient states she is on approximately 14 prescriptions per day.  Patient states she uses a walker for ambulation. Patient reports she has neuropathy in her feet due to her diabetes. Patient states she checks her blood sugar, oxygen level and blood pressure and records in her log book. Patient states she sees an endocrinologist. Patient states she deals with symptoms of anxiety / depression. Patient states she has seen psychiatrist in the past but feels they have not helped her.  Patient denies seeing a Social worker.  RNCM offered [facility] care management social work follow up to discuss counseling options. Patient verbally agreed.  Patient states she has "a thumping like noise in her ear continuously."  Patient states, "it sounds like its in my head."  Patient states she can cover her ears with  her hands and she still hears the sound.  Patient states if she is watching tv she does not hear it. Patient states if it is very quiet the thumping sound becomes louder. Patient states she has not seen a doctor for this. Patient states she sees and ENT doctor periodically.  RNCM advised patient to contact her ENT specialist and request appointment to have her symptoms evaluated. RNCM advised patient if she is unable to get a sooner appointment call her primary MD for an appointment. Patient verbally agreed.   RNCM advised patient to notify MD of any changes in condition prior to scheduled appointment. RNCM provided contact name and number: [phone] or main office number [phone] and 24 hour nurse advise line [phone].  RNCM verified patient aware of 911 services for urgent/ emergent needs. Patient verbally agreed to next telephone follow up with RNCM.  ASSESSMENT; patient will benefit from ongoing care coordination follow up by telephonic RNCM, Patient will benefit from referral to pharmacist for medication assistant and to social worker to discuss counseling options.   PLAN: RNCM will follow up with patient within 1 week.  RNCM will send patient [facility] care management welcome packet and consent form.  RNCM will send patients primary MD involvement letter.  Patient will call and schedule appointment with her doctor regarding ear symptoms.   Quinn Plowman RN,BSN,CCM [facility] Telephonic  [phone]

## 2017-09-28 ENCOUNTER — Other Ambulatory Visit: Payer: Self-pay

## 2017-09-28 NOTE — Patient Outreach (Signed)
[facility] Care Management  09/28/2017   Summer [name] 06/11/1951 [id number]  Initial telephone assessment Referral date: 09/20/17 Referral source:[employer] direct referral  Referral reason: Frequent emergency department visits  Insurance: [employer] PCP: Dr. Ancil Boozer Podiatrist:  Dr. Pricilla Holm  Subjective:   Telephone call to patient regarding initial telephone assessment. HIPAA verified with patient.  Patient states she has not heard from the [facility] care management pharmacist or social worker. RNCM noted referral was made to social worker and pharmacist on 09/24/17.   Patient reports she has a follow up visit with her ENT on 10/01/17 to discuss the "thumping sound in my ear."   Patient states her next follow up appointment with her primary MD is the end of February or March 2019. Patient reports she is having some cold like symptoms that started 2-3 days ago.  Patient states if feels like a head cold. Patient states she was barely able to get out of bed on yesterday.  RNCM advised patient if symptoms worsen to call her doctor to reports. Patient verbalized understanding.  Patient states she uses a walker for ambulation. Patient states she has arthritic knees.  Patient states her doctor is aware. She reports she takes over the counter pain reliever an applies rubbing cream for pain management.  Patient states she currently weights 252 lbs. Patient reports she has gained 10 lbs over the past month. Patient states, "I think it may be fluid."  Patient states she notices more swelling in her legs. Patient states she has discussed the swelling in her legs at a past doctor appointment. Patient states, " my doctor didn't seem to be to concerned about it." RNCM discussed with patient importance of seeing doctor sooner to discuss the concerns she has.  Patient reports she has had 2 visits with a home health nurse from [facility]'s home health. Patient states her last visit was on yesterday  09/27/17.  Patient states, " the nurse was a little concerned about the swelling in my legs."  Patient denies any increase shortness of breath with the swelling.  Patient report her big toe is white and her foot is puffy. Patient states she saw her podiatrist around the end of December 2018 regarding these symptoms. Patient states her doctor informed her she had good circulation to the toes and foot.  No additional follow up was given per patient.  Patient states her blood sugar was 175 fasting this am. Patient states it was up to 400 last night. Patient states she sometimes splurges on things she shouldn't eat and she knows this is what caused the spike in her blood sugars.   Patient states she has an upcoming dental appointment to get her teeth fixed.  Patient states her main concern right now is losing weight. Patient states this is what she wants to focus on with RNCM. Patient states she saw a nutritionist approximately 6-8 years ago when she was diagnosed with diabetes.  Patient verbally agreed to ongoing follow up with [facility] regarding weight management.  RNCM offered to call patients primary MD to arrange and earlier appointment. Patient verbally agreed [facility] called Dr. Ancil Boozer office and spoke withTaran.  Taran scheduled patient for follow up appointment with primary MD for Tuesday 10/02/17 at 10:00am.  Patient contacted back by [facility] and notified of new follow up appointment for 10/02/17 at 10am.  Patient verbally appreciative of assistance.   Objective: see attached assessment  Current Medications:  Current Outpatient Medications  Medication Sig Dispense Refill  .  ACCU-CHEK SOFTCLIX LANCETS lancets     . acetaminophen (TYLENOL 8 HOUR) 650 MG CR tablet Take 1 tablet (650 mg total) by mouth every 8 (eight) hours as needed for pain. 90 tablet 0  . albuterol (PROVENTIL HFA) 108 (90 Base) MCG/ACT inhaler Inhale 2 puffs into the lungs every 6 (six) hours as needed.    Marland Kitchen amitriptyline (ELAVIL) 50 MG  tablet TAKE 1/2 TO 1 TABLET AT BEDTIME 90 tablet 0  . ASPIRIN LOW DOSE 81 MG EC tablet TAKE 1 TABLET (81 MG TOTAL) BY MOUTH DAILY. 30 tablet 0  . Blood Glucose Monitoring Suppl (ACCU-CHEK AVIVA PLUS) w/Device KIT     . clotrimazole (MYCELEX) 10 MG troche     . diclofenac sodium (VOLTAREN) 1 % GEL Apply topically as needed. Reported on 02/22/2016    . fluticasone furoate-vilanterol (BREO ELLIPTA) 200-25 MCG/INH AEPB Inhale 1 puff into the lungs daily. Rinse mouth after use. 1 each 5  . Fluticasone-Salmeterol (ADVAIR DISKUS) 250-50 MCG/DOSE AEPB Inhale 1 puff into the lungs 2 (two) times daily. 60 each 6  . furosemide (LASIX) 20 MG tablet Take 1 tablet (20 mg total) by mouth every morning. 15 tablet 1  . Glucose Blood (BLOOD GLUCOSE TEST STRIPS) STRP     . Insulin Glargine (LANTUS SOLOSTAR) 100 UNIT/ML Solostar Pen Inject 50 Units into the skin daily at 10 pm. (Patient taking differently: Inject 68 Units daily at 10 pm into the skin. ) 15 mL 2  . Insulin Pen Needle (RELION PEN NEEDLE 31G/8MM) 31G X 8 MM MISC by Does not apply route 2 (two) times daily.    . insulin regular (NOVOLIN R,HUMULIN R) 100 units/mL injection Inject 0.1 mLs (10 Units total) into the skin 3 (three) times daily before meals. (Patient taking differently: Inject 36 Units 3 (three) times daily before meals into the skin. ) 10 mL 0  . levothyroxine (SYNTHROID, LEVOTHROID) 25 MCG tablet TAKE 1 TABLET EVERY DAY  EXCEPT TAKE 2 TABLETS  ON  SUNDAYS 100 tablet 0  . losartan-hydrochlorothiazide (HYZAAR) 100-12.5 MG tablet Take 1 tablet by mouth daily. 90 tablet 1  . metFORMIN (GLUCOPHAGE-XR) 500 MG 24 hr tablet Take 1 tablet (500 mg total) by mouth daily with breakfast. 180 tablet 1  . metoprolol tartrate (LOPRESSOR) 25 MG tablet TAKE 1 TABLET TWICE DAILY 180 tablet 1  . montelukast (SINGULAIR) 10 MG tablet TAKE 1 TABLET AT BEDTIME 90 tablet 1  . omeprazole (PRILOSEC) 40 MG capsule TAKE 1 CAPSULE (40 MG TOTAL) BY MOUTH DAILY. 90 capsule 1   . Potassium 99 MG TABS Take 1 tablet by mouth daily.    . pregabalin (LYRICA) 300 MG capsule Take 2 capsules (600 mg total) by mouth daily. 180 capsule 1  . ranitidine (ZANTAC) 150 MG tablet Take 150 mg by mouth at bedtime.    Marland Kitchen RELION INSULIN SYRINGE 1ML/31G 31G X 5/16" 1 ML MISC     . rosuvastatin (CRESTOR) 20 MG tablet Take 20 mg by mouth daily.    . VENTOLIN HFA 108 (90 Base) MCG/ACT inhaler Inhale 2 puffs into the lungs every 6 (six) hours as needed.    . vitamin C (ASCORBIC ACID) 500 MG tablet Take 500 mg by mouth daily.    Marland Kitchen VITAMIN D, CHOLECALCIFEROL, PO Take 20,000 Units by mouth daily.     No current facility-administered medications for this visit.     Functional Status:  In your present state of health, do you have any difficulty performing the following  activities: 09/28/2017 08/31/2017  Hearing? N N  Vision? N N  Difficulty concentrating or making decisions? N N  Comment - -  Walking or climbing stairs? Y Y  Comment - -  Dressing or bathing? N N  Doing errands, shopping? N N  Preparing Food and eating ? N -  Using the Toilet? N -  In the past six months, have you accidently leaked urine? N -  Do you have problems with loss of bowel control? N -  Managing your Medications? N -  Managing your Finances? N -  Housekeeping or managing your Housekeeping? N -  Some recent data might be hidden    Fall/Depression Screening: Fall Risk  09/28/2017 08/31/2017 08/21/2017  Falls in the past year? No No No  Comment - - -  Number falls in past yr: - - -  Injury with Fall? - - -  Risk Factor Category  - - -  Comment - - -  Risk for fall due to : - - -  Risk for fall due to: Comment - - -  Follow up - - -   PHQ 2/9 Scores 09/28/2017 09/21/2017 08/31/2017 06/27/2017 05/18/2017 09/19/2016 08/18/2016  PHQ - 2 Score 4 4 0 6 0 2 6  PHQ- 9 Score 18 18 - 24 - 3 21    THN CM Care Plan Problem One     Most Recent Value  Care Plan Problem One  weight gain of 10 lbs within 1 month  Role  Documenting the Problem One  Care Management Telephonic [place] for Problem One  Active  [facility] Long Term Goal   Patient will report she has lost at least 10 lbs within 60 day  THN Long Term Goal Start Date  09/28/17  Interventions for Problem One Long Term Goal  RNCM will send patient EMMI education material to patient related to weight loss. RNCM discussed with patient need for follow up with dietician/nutritionist.  RNCM advised patient to call her health insurance plan to verify coverage for nutritionist/ dietician services.   THN CM Short Term Goal #1   patient will report contacting her insurance company to determine coverage for dietician/ nutritionist within 2 weeks  THN CM Short Term Goal #1 Start Date  09/28/17  Interventions for Short Term Goal #1  patient advised to call insurance company for coverage information  [facility] CM Short Term Goal #2   Patient will report keeping her follow up appointment with her primary MD within 2 week. s  [facility] CM Short Term Goal #2 Start Date  09/28/17  Interventions for Short Term Goal #2  RNCM called and arrange earlier follow up appointment with primary MD for patient.  RNCM advised patient to discuss her recent weight increase with her doctor      Assessment:  ASSESSMENT: Ongoing follow up with patient for weight management and care coordination.  Plan:   PLAN;  Patient will contact [employer] customer service to find out about coverage for nutrition/ dietician Patient will report keeping follow up appointment with primary MD RNCM will follow up on pharmacy/ social work referral  RNCM will send patient information on weight management  Quinn Plowman RN,BSN,CCM [facility] Telephonic  [phone]

## 2017-10-01 DIAGNOSIS — J0181 Other acute recurrent sinusitis: Secondary | ICD-10-CM | POA: Diagnosis not present

## 2017-10-01 DIAGNOSIS — J312 Chronic pharyngitis: Secondary | ICD-10-CM | POA: Diagnosis not present

## 2017-10-02 ENCOUNTER — Ambulatory Visit (INDEPENDENT_AMBULATORY_CARE_PROVIDER_SITE_OTHER): Payer: Medicare PPO | Admitting: Family Medicine

## 2017-10-02 ENCOUNTER — Encounter: Payer: Self-pay | Admitting: Family Medicine

## 2017-10-02 ENCOUNTER — Other Ambulatory Visit: Payer: Self-pay | Admitting: *Deleted

## 2017-10-02 VITALS — BP 150/80 | HR 90 | Resp 16 | Ht 62.0 in | Wt 250.6 lb

## 2017-10-02 DIAGNOSIS — R0602 Shortness of breath: Secondary | ICD-10-CM

## 2017-10-02 DIAGNOSIS — R600 Localized edema: Secondary | ICD-10-CM | POA: Diagnosis not present

## 2017-10-02 DIAGNOSIS — N183 Chronic kidney disease, stage 3 unspecified: Secondary | ICD-10-CM

## 2017-10-02 DIAGNOSIS — G4733 Obstructive sleep apnea (adult) (pediatric): Secondary | ICD-10-CM | POA: Diagnosis not present

## 2017-10-02 DIAGNOSIS — F331 Major depressive disorder, recurrent, moderate: Secondary | ICD-10-CM | POA: Diagnosis not present

## 2017-10-02 DIAGNOSIS — I5030 Unspecified diastolic (congestive) heart failure: Secondary | ICD-10-CM | POA: Diagnosis not present

## 2017-10-02 MED ORDER — FUROSEMIDE 20 MG PO TABS: 20.0000 mg | ORAL_TABLET | Freq: Two times a day (BID) | ORAL | 0 refills | Status: DC

## 2017-10-02 NOTE — Patient Outreach (Signed)
[facility]) Care Management  10/02/2017  Summer [name] 07/20/1951 [id number]   Late Entry-patient referred to this social worker to review counseling resources. Per patient, she has been depressed on and off for years. 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 Patient states that she does not like to go to psychiatrist, stating that she does not feel that they are effective, however would like to be connected to a therapist. Per patient, she sw a therapist that she liked in the past but that therapist left the agency. This Education officer, museum completed a search with patient's permission of in network therapist and reviewed the list with her.Patient states that she will give one of the providers a call within the next three weeks.  Plan: This Education officer, museum will follow up with patient within the next 2 weeks regarding referrals provided.   Sheralyn Boatman [facility] Care Management [phone]

## 2017-10-02 NOTE — Progress Notes (Signed)
Name: Summer [name]   MRN: [id number]    DOB: [birth year]   Date:10/02/2017       Progress Note  Subjective  Chief Complaint  Chief Complaint  Patient presents with  . Edema  . Weight Gain    HPI  Edema/SOB/weight gain: she has a history of CKI, hypothyroidism, CHF with normal EF and sleep apnea. She has gained 10 lbs over the past month at home, and based on our weight from one month ago she has gained 11 lbs. She is getting more shortness of breath, she is noticing more swelling than usual - mostly on legs but sometimes on hands. Unable to put compression stockings on, also not able to wear CPAP for the past 2 months because it is broken and has been struggling to have it repaired. The [facility] nurse advised her to be seen.   Depression: she has a very high phQ9, she states being sick is not helping and stopped seeing psychiatrist because of cost and did not feel it was helping. Explained that being depressed does not help her overall health, we gave her a list of therapists today and we will place another referral to psychiatrist. She denies suicidal thoughts or ideation.    Patient Active Problem List   Diagnosis Date Noted  . (HFpEF) heart failure with preserved ejection fraction ([place]) 08/31/2017  . Moderate persistent asthma 08/31/2017  . Hyperlipidemia due to type 2 diabetes mellitus ([place]) 06/14/2017  . Chest pain of unknown etiology 10/06/2016  . Essential hypertension 10/06/2016  . Morbid obesity with BMI of 40.0-44.9, adult ([place]) 10/06/2016  . Dyslipidemia associated with type 2 diabetes mellitus ([place]) 08/18/2016  . Charcot foot due to diabetes mellitus ([place]) 02/22/2016  . Hypertriglyceridemia 07/27/2015  . Acquired abduction deformity of foot 07/12/2015  . Osteoarthritis of subtalar joint 07/12/2015  . Poorly controlled type 2 diabetes mellitus with neuropathy ([place]) 07/12/2015  . Arthritis of foot, degenerative 07/12/2015  . Carpal tunnel syndrome 04/17/2015  . Chronic  constipation 04/17/2015  . Insomnia, persistent 04/17/2015  . Chronic kidney disease (CKD), stage III (moderate) ([place]) 04/17/2015  . Decreased exercise tolerance 04/17/2015  . Diabetes mellitus with polyneuropathy ([place]) 04/17/2015  . Gastro-esophageal reflux disease without esophagitis 04/17/2015  . Bursitis, trochanteric 04/17/2015  . Cephalalgia 04/17/2015  . Benign hypertension 04/17/2015  . Adult hypothyroidism 04/17/2015  . Hearing loss 04/17/2015  . Chronic recurrent major depressive disorder ([place]) 04/17/2015  . Neurogenic claudication 04/17/2015  . Obesity (BMI 35.0-39.9 without comorbidity) 04/17/2015  . Hypo-ovarianism 04/17/2015  . Perennial allergic rhinitis with seasonal variation 04/17/2015  . Acne erythematosa 04/17/2015  . Dyskinesia, tardive 04/17/2015  . Memory loss 04/17/2015  . Impingement syndrome of shoulder 04/17/2015  . Dermatitis, stasis 04/17/2015  . Obstructive sleep apnea 05/14/2014  . Dyspnea 05/06/2014  . Hyperlipidemia 02/06/2012  . LBP (low back pain) 09/16/2008    Past Surgical History:  Procedure Laterality Date  . ANKLE SURGERY Left approx Jan 2018  . CATARACT EXTRACTION  01/2011   right  . eye lid surgery  2013   bilateral  . FOOT SURGERY    . NECK SURGERY    . TUBAL LIGATION    . VAGINAL HYSTERECTOMY  1989    Family History  Problem Relation Age of Onset  . Heart attack Mother   . Aneurysm Mother     Social History   Socioeconomic History  . Marital status: Widowed    Spouse name: Not on file  . Number of children:  Not on file  . Years of education: Not on file  . Highest education level: Not on file  Social Needs  . Financial resource strain: Not on file  . Food insecurity - worry: Not on file  . Food insecurity - inability: Not on file  . Transportation needs - medical: Not on file  . Transportation needs - non-medical: Not on file  Occupational History  . Not on file  Tobacco Use  . Smoking status: Never Smoker  .  Smokeless tobacco: Never Used  Substance and Sexual Activity  . Alcohol use: No    Alcohol/week: 0.0 - 0.6 oz    Frequency: Never  . Drug use: No  . Sexual activity: Yes    Partners: Male  Other Topics Concern  . Not on file  Social History Narrative  . Not on file     Current Outpatient Medications:  .  ACCU-CHEK SOFTCLIX LANCETS lancets, , Disp: , Rfl:  .  acetaminophen (TYLENOL 8 HOUR) 650 MG CR tablet, Take 1 tablet (650 mg total) by mouth every 8 (eight) hours as needed for pain., Disp: 90 tablet, Rfl: 0 .  albuterol (PROVENTIL HFA) 108 (90 Base) MCG/ACT inhaler, Inhale 2 puffs into the lungs every 6 (six) hours as needed., Disp: , Rfl:  .  amitriptyline (ELAVIL) 50 MG tablet, TAKE 1/2 TO 1 TABLET AT BEDTIME, Disp: 90 tablet, Rfl: 0 .  ASPIRIN LOW DOSE 81 MG EC tablet, TAKE 1 TABLET (81 MG TOTAL) BY MOUTH DAILY., Disp: 30 tablet, Rfl: 0 .  Blood Glucose Monitoring Suppl (ACCU-CHEK AVIVA PLUS) w/Device KIT, , Disp: , Rfl:  .  clotrimazole (MYCELEX) 10 MG troche, , Disp: , Rfl:  .  diclofenac sodium (VOLTAREN) 1 % GEL, Apply topically as needed. Reported on 02/22/2016, Disp: , Rfl:  .  fluticasone furoate-vilanterol (BREO ELLIPTA) 200-25 MCG/INH AEPB, Inhale 1 puff into the lungs daily. Rinse mouth after use., Disp: 1 each, Rfl: 5 .  Fluticasone-Salmeterol (ADVAIR DISKUS) 250-50 MCG/DOSE AEPB, Inhale 1 puff into the lungs 2 (two) times daily., Disp: 60 each, Rfl: 6 .  Glucose Blood (BLOOD GLUCOSE TEST STRIPS) STRP, , Disp: , Rfl:  .  Insulin Glargine (LANTUS SOLOSTAR) 100 UNIT/ML Solostar Pen, Inject 50 Units into the skin daily at 10 pm. (Patient taking differently: Inject 68 Units daily at 10 pm into the skin. ), Disp: 15 mL, Rfl: 2 .  Insulin Pen Needle (RELION PEN NEEDLE 31G/8MM) 31G X 8 MM MISC, by Does not apply route 2 (two) times daily., Disp: , Rfl:  .  insulin regular (NOVOLIN R,HUMULIN R) 100 units/mL injection, Inject 0.1 mLs (10 Units total) into the skin 3 (three) times  daily before meals. (Patient taking differently: Inject 36 Units 3 (three) times daily before meals into the skin. ), Disp: 10 mL, Rfl: 0 .  levothyroxine (SYNTHROID, LEVOTHROID) 25 MCG tablet, TAKE 1 TABLET EVERY DAY  EXCEPT TAKE 2 TABLETS  ON  SUNDAYS, Disp: 100 tablet, Rfl: 0 .  losartan-hydrochlorothiazide (HYZAAR) 100-12.5 MG tablet, Take 1 tablet by mouth daily., Disp: 90 tablet, Rfl: 1 .  metFORMIN (GLUCOPHAGE-XR) 500 MG 24 hr tablet, Take 1 tablet (500 mg total) by mouth daily with breakfast., Disp: 180 tablet, Rfl: 1 .  metoprolol tartrate (LOPRESSOR) 25 MG tablet, TAKE 1 TABLET TWICE DAILY, Disp: 180 tablet, Rfl: 1 .  montelukast (SINGULAIR) 10 MG tablet, TAKE 1 TABLET AT BEDTIME, Disp: 90 tablet, Rfl: 1 .  omeprazole (PRILOSEC) 40 MG capsule, TAKE 1  CAPSULE (40 MG TOTAL) BY MOUTH DAILY., Disp: 90 capsule, Rfl: 1 .  Potassium 99 MG TABS, Take 1 tablet by mouth daily., Disp: , Rfl:  .  pregabalin (LYRICA) 300 MG capsule, Take 2 capsules (600 mg total) by mouth daily., Disp: 180 capsule, Rfl: 1 .  ranitidine (ZANTAC) 150 MG tablet, Take 150 mg by mouth at bedtime., Disp: , Rfl:  .  RELION INSULIN SYRINGE 1ML/31G 31G X 5/16" 1 ML MISC, , Disp: , Rfl:  .  rosuvastatin (CRESTOR) 20 MG tablet, Take 20 mg by mouth daily., Disp: , Rfl:  .  VENTOLIN HFA 108 (90 Base) MCG/ACT inhaler, Inhale 2 puffs into the lungs every 6 (six) hours as needed., Disp: , Rfl:  .  vitamin C (ASCORBIC ACID) 500 MG tablet, Take 500 mg by mouth daily., Disp: , Rfl:  .  VITAMIN D, CHOLECALCIFEROL, PO, Take 20,000 Units by mouth daily., Disp: , Rfl:  .  furosemide (LASIX) 20 MG tablet, Take 1 tablet (20 mg total) by mouth 2 (two) times daily for 10 days., Disp: 20 tablet, Rfl: 0  Allergies  Allergen Reactions  . Codeine Other (See Comments)    "TRIPPED OUT"  DIDN'T LIKE THE [place]  . Atorvastatin     muscle pain  . Latex Rash  . Zolpidem Other (See Comments)    Sleep walk     ROS  Ten systems reviewed  and is negative except as mentioned in HPI   Objective  Vitals:   10/02/17 1002  BP: (!) 150/80  Pulse: 90  Resp: 16  SpO2: 98%  Weight: 250 lb 9.6 oz (113.7 kg)  Height: '5\' 2"'$  (1.575 m)    Body mass index is 45.84 kg/m.  Physical Exam  Constitutional: Patient appears well-developed and well-nourished. Obese  HEENT: head atraumatic, normocephalic, pupils equal and reactive to light,  neck supple, throat within normal limits Cardiovascular: Normal rate, regular rhythm and normal heart sounds.  No murmur heard. 2 plus pitting BLE edema. Pulmonary/Chest: Effort normal and breath sounds normal. In mild  respiratory distress. Abdominal: Soft.  There is no tenderness. Psychiatric: Patient has a normal mood and affect. behavior is normal. Judgment and thought content normal.  Recent Results (from the past 2160 hour(s))  COMPLETE METABOLIC PANEL WITH GFR     Status: Abnormal   Collection Time: 08/31/17  3:32 PM  Result Value Ref Range   Glucose, Bld 171 (H) 65 - 99 mg/dL    Comment: .            Fasting reference interval . For someone without known diabetes, a glucose value >125 mg/dL indicates that they may have diabetes and this should be confirmed with a follow-up test. .    BUN 29 (H) 7 - 25 mg/dL   Creat 1.17 (H) 0.50 - 0.99 mg/dL    Comment: For patients >23 years of age, the reference limit for Creatinine is approximately 13% higher for people identified as African-American. .    GFR, Est Non African American 48 (L) > OR = 60 mL/min/1.61m   GFR, Est African American 55 (L) > OR = 60 mL/min/1.763m  BUN/Creatinine Ratio 25 (H) 6 - 22 (calc)   Sodium 139 135 - 146 mmol/L   Potassium 4.5 3.5 - 5.3 mmol/L   Chloride 103 98 - 110 mmol/L   CO2 26 20 - 32 mmol/L   Calcium 10.1 8.6 - 10.4 mg/dL   Total Protein 6.3 6.1 - 8.1 g/dL  Albumin 4.3 3.6 - 5.1 g/dL   Globulin 2.0 1.9 - 3.7 g/dL (calc)   AG Ratio 2.2 1.0 - 2.5 (calc)   Total Bilirubin 0.3 0.2 - 1.2 mg/dL    Alkaline phosphatase (APISO) 79 33 - 130 U/L   AST 19 10 - 35 U/L   ALT 28 6 - 29 U/L  CBC     Status: None   Collection Time: 08/31/17  3:32 PM  Result Value Ref Range   WBC 10.2 3.8 - 10.8 Thousand/uL   RBC 4.32 3.80 - 5.10 Million/uL   Hemoglobin 12.4 11.7 - 15.5 g/dL   HCT 36.5 35.0 - 45.0 %   MCV 84.5 80.0 - 100.0 fL   MCH 28.7 27.0 - 33.0 pg   MCHC 34.0 32.0 - 36.0 g/dL   RDW 14.9 11.0 - 15.0 %   Platelets 271 140 - 400 Thousand/uL   MPV 11.4 7.5 - 12.5 fL     PHQ2/9: Depression screen [facility] 2/9 09/28/2017 09/21/2017 08/31/2017 06/27/2017 05/18/2017  Decreased Interest 3 3 0 3 0  Down, Depressed, Hopeless 1 1 0 3 0  PHQ - 2 Score 4 4 0 6 0  Altered sleeping 2 2 - 3 -  Tired, decreased energy 2 2 - - -  Change in appetite 3 3 - 3 -  Feeling bad or failure about yourself  3 3 - 3 -  Trouble concentrating 2 2 - 3 -  Moving slowly or fidgety/restless 0 0 - 3 -  Suicidal thoughts 2 2 - 3 -  PHQ-9 Score 18 18 - 24 -  Difficult doing work/chores Very difficult Very difficult - Extremely dIfficult -  Some recent data might be hidden    Fall Risk: Fall Risk  10/02/2017 09/28/2017 08/31/2017 08/21/2017 06/27/2017  Falls in the past year? No No No No Yes  Comment - - - - -  Number falls in past yr: - - - - 2 or more  Injury with Fall? - - - - No  Risk Factor Category  - - - - High Fall Risk  Comment - - - - short of breath, diuretics  Risk for fall due to : - - - - Impaired balance/gait;Impaired mobility;Medication side effect  Risk for fall due to: Comment - - - - s/p L ankle repair  Follow up - - - - Education provided;Falls prevention discussed      Functional Status Survey: Is the patient deaf or have difficulty hearing?: No Does the patient have difficulty seeing, even when wearing glasses/contacts?: No Does the patient have difficulty concentrating, remembering, or making decisions?: No Does the patient have difficulty walking or climbing stairs?: Yes Does the patient  have difficulty dressing or bathing?: No Does the patient have difficulty doing errands alone such as visiting a doctor's office or shopping?: No   Assessment & Plan  1. Edema extremities  - COMPLETE METABOLIC PANEL WITH GFR - B Nat Peptide - TSH - furosemide (LASIX) 20 MG tablet; Take 1 tablet (20 mg total) by mouth 2 (two) times daily for 10 days.  Dispense: 20 tablet; Refill: 0  2. Obstructive sleep apnea  She needs to resume CPAP   3. Chronic kidney disease (CKD), stage III (moderate) ([name])  - COMPLETE METABOLIC PANEL WITH GFR May need to be referred to nephrologist   4. (HFpEF) heart failure with preserved ejection fraction ([name])  - B Nat Peptide - furosemide (LASIX) 20 MG tablet; Take 1 tablet (20 mg total)  by mouth 2 (two) times daily for 10 days.  Dispense: 20 tablet; Refill: 0  5. SOB (shortness of breath)  - B Nat Peptide  6. MDD (major depressive disorder), recurrent episode, moderate ([place])  - Ambulatory referral to Psychiatry  -Red flags and when to present for emergency care or RTC including fever >101.50F, chest pain, shortness of breath, new/worsening/un-resolving symptoms,  reviewed with patient at time of visit. Follow up and care instructions discussed and provided in AVS.

## 2017-10-03 LAB — BRAIN NATRIURETIC PEPTIDE: Brain Natriuretic Peptide: 20 pg/mL (ref <100)

## 2017-10-03 LAB — TSH: TSH: 2.01 mIU/L (ref 0.40–4.50)

## 2017-10-03 LAB — COMPLETE METABOLIC PANEL WITH GFR
AG Ratio: 2 (calc) (ref 1.0–2.5)
ALT: 24 U/L (ref 6–29)
AST: 21 U/L (ref 10–35)
Albumin: 4.3 g/dL (ref 3.6–5.1)
Alkaline phosphatase (APISO): 87 U/L (ref 33–130)
BUN: 22 mg/dL (ref 7–25)
CO2: 27 mmol/L (ref 20–32)
Calcium: 10 mg/dL (ref 8.6–10.4)
Chloride: 103 mmol/L (ref 98–110)
Creat: 0.96 mg/dL (ref 0.50–0.99)
GFR, Est African American: 70 mL/min/{1.73_m2} (ref >=60)
GFR, Est Non African American: 61 mL/min/{1.73_m2} (ref >=60)
Globulin: 2.1 g/dL (calc) (ref 1.9–3.7)
Glucose, Bld: 158 mg/dL — ABNORMAL HIGH (ref 65–99)
Potassium: 4.4 mmol/L (ref 3.5–5.3)
Sodium: 140 mmol/L (ref 135–146)
Total Bilirubin: 0.4 mg/dL (ref 0.2–1.2)
Total Protein: 6.4 g/dL (ref 6.1–8.1)

## 2017-10-04 ENCOUNTER — Other Ambulatory Visit: Payer: Self-pay | Admitting: Family Medicine

## 2017-10-04 ENCOUNTER — Ambulatory Visit (INDEPENDENT_AMBULATORY_CARE_PROVIDER_SITE_OTHER): Payer: Medicare PPO | Admitting: Family Medicine

## 2017-10-04 ENCOUNTER — Other Ambulatory Visit: Payer: Self-pay | Admitting: Pharmacist

## 2017-10-04 ENCOUNTER — Encounter: Payer: Self-pay | Admitting: Family Medicine

## 2017-10-04 VITALS — BP 124/68 | HR 107 | Temp 97.8°F | Resp 20 | Ht 62.0 in | Wt 252.1 lb

## 2017-10-04 DIAGNOSIS — R0602 Shortness of breath: Secondary | ICD-10-CM | POA: Diagnosis not present

## 2017-10-04 DIAGNOSIS — F331 Major depressive disorder, recurrent, moderate: Secondary | ICD-10-CM | POA: Diagnosis not present

## 2017-10-04 DIAGNOSIS — I10 Essential (primary) hypertension: Secondary | ICD-10-CM

## 2017-10-04 DIAGNOSIS — R600 Localized edema: Secondary | ICD-10-CM

## 2017-10-04 DIAGNOSIS — B370 Candidal stomatitis: Secondary | ICD-10-CM | POA: Diagnosis not present

## 2017-10-04 MED ORDER — VALSARTAN-HYDROCHLOROTHIAZIDE 320-25 MG PO TABS: 1.0000 | ORAL_TABLET | Freq: Every day | ORAL | 1 refills | Status: DC

## 2017-10-04 MED ORDER — ALBUTEROL SULFATE HFA 108 (90 BASE) MCG/ACT IN AERS: 2.0000 | INHALATION_SPRAY | Freq: Four times a day (QID) | RESPIRATORY_TRACT | 0 refills | Status: DC | PRN

## 2017-10-04 MED ORDER — MOMETASONE FURO-FORMOTEROL FUM 100-5 MCG/ACT IN AERO: 2.0000 | INHALATION_SPRAY | Freq: Two times a day (BID) | RESPIRATORY_TRACT | 1 refills | Status: DC

## 2017-10-04 NOTE — Progress Notes (Signed)
Name: Summer [name]   MRN: [id number]    DOB: 05-Jul-1951   Date:10/04/2017       Progress Note  Subjective  Chief Complaint  Chief Complaint  Patient presents with  . Follow-up    3 day   . Edema/SOB/Weight Gain    Patient has gained 2 pounds since last visit 2 days ago, still having edema in bilateral ankles-has not been able to tell much different with Lasix yet  . Depression    Has not received a call from [facility] or psychiatrist yet.    HPI  Edema/SOB/weight gain: she has a history of CKI, hypothyroidism, CHF with normal EF and sleep apnea. She had gained 10 lbs over the past month at home, and based on our weight from one month ago she had gained 11 lbs, two more pounds since last visit 3 days ago. She has not been using Breo ( states too expensive), reviewed labs done on her last visit, not in CHF, normal kidney function and liver function test, TSH normal. She has been taking lasix as prescribed twice daily and elevating her legs, states SOB not as severe and swelling is going down. She states she is feeling much better today  Sinusitis oral candidiasis: seen by ENT and was given Diflucan, Micelex and also Ceftin and is feeling better, not coughing as much.   Depression: she had a very high phQ9, she states being sick is not helping and stopped seeing psychiatrist because of cost and did not feel it was helping. Explained that being depressed does not help her overall health, we gave her a list of therapists on her last visit  and we  placde another referral to psychiatrist. She denies suicidal thoughts or ideation. She is waiting for appointment.   HTN: bp was elevated last visit and again today, denies chest pain or palpitation, we will change from [place] 100/12.5 to Valsartan 320/25 and recheck bp is one month or sooner if needed.   Patient Active Problem List   Diagnosis Date Noted  . (HFpEF) heart failure with preserved ejection fraction ([place]) 08/31/2017  . Moderate  persistent asthma 08/31/2017  . Hyperlipidemia due to type 2 diabetes mellitus ([place]) 06/14/2017  . Chest pain of unknown etiology 10/06/2016  . Essential hypertension 10/06/2016  . Morbid obesity with BMI of 40.0-44.9, adult ([place]) 10/06/2016  . Dyslipidemia associated with type 2 diabetes mellitus ([place]) 08/18/2016  . Charcot foot due to diabetes mellitus ([place]) 02/22/2016  . Hypertriglyceridemia 07/27/2015  . Acquired abduction deformity of foot 07/12/2015  . Osteoarthritis of subtalar joint 07/12/2015  . Poorly controlled type 2 diabetes mellitus with neuropathy ([place]) 07/12/2015  . Arthritis of foot, degenerative 07/12/2015  . Carpal tunnel syndrome 04/17/2015  . Chronic constipation 04/17/2015  . Insomnia, persistent 04/17/2015  . Chronic kidney disease (CKD), stage III (moderate) ([place]) 04/17/2015  . Decreased exercise tolerance 04/17/2015  . Diabetes mellitus with polyneuropathy ([place]) 04/17/2015  . Gastro-esophageal reflux disease without esophagitis 04/17/2015  . Bursitis, trochanteric 04/17/2015  . Cephalalgia 04/17/2015  . Benign hypertension 04/17/2015  . Adult hypothyroidism 04/17/2015  . Hearing loss 04/17/2015  . Chronic recurrent major depressive disorder ([place]) 04/17/2015  . Neurogenic claudication 04/17/2015  . Obesity (BMI 35.0-39.9 without comorbidity) 04/17/2015  . Hypo-ovarianism 04/17/2015  . Perennial allergic rhinitis with seasonal variation 04/17/2015  . Acne erythematosa 04/17/2015  . Dyskinesia, tardive 04/17/2015  . Memory loss 04/17/2015  . Impingement syndrome of shoulder 04/17/2015  . Dermatitis, stasis 04/17/2015  . Obstructive  sleep apnea 05/14/2014  . Dyspnea 05/06/2014  . Hyperlipidemia 02/06/2012  . LBP (low back pain) 09/16/2008    Social History   Tobacco Use  . Smoking status: Never Smoker  . Smokeless tobacco: Never Used  Substance Use Topics  . Alcohol use: No    Alcohol/week: 0.0 - 0.6 oz    Frequency: Never     Current  Outpatient Medications:  .  ACCU-CHEK SOFTCLIX LANCETS lancets, , Disp: , Rfl:  .  acetaminophen (TYLENOL 8 HOUR) 650 MG CR tablet, Take 1 tablet (650 mg total) by mouth every 8 (eight) hours as needed for pain., Disp: 90 tablet, Rfl: 0 .  albuterol (PROVENTIL HFA) 108 (90 Base) MCG/ACT inhaler, Inhale 2 puffs into the lungs every 6 (six) hours as needed., Disp: , Rfl:  .  amitriptyline (ELAVIL) 50 MG tablet, TAKE 1/2 TO 1 TABLET AT BEDTIME, Disp: 90 tablet, Rfl: 0 .  ASPIRIN LOW DOSE 81 MG EC tablet, TAKE 1 TABLET (81 MG TOTAL) BY MOUTH DAILY., Disp: 30 tablet, Rfl: 0 .  Blood Glucose Monitoring Suppl (ACCU-CHEK AVIVA PLUS) w/Device KIT, , Disp: , Rfl:  .  cefUROXime (CEFTIN) 250 MG tablet, Take 250 mg by mouth 2 (two) times daily with a meal. Melford Aase, M.D-ENT, Disp: , Rfl:  .  clotrimazole (MYCELEX) 10 MG troche, Take 10 mg by mouth 2 (two) times daily. Melford Aase, M.D- ENT, Disp: , Rfl:  .  diclofenac sodium (VOLTAREN) 1 % GEL, Apply topically as needed. Reported on 02/22/2016, Disp: , Rfl:  .  fluconazole (DIFLUCAN) 100 MG tablet, Take 100 mg by mouth daily. Melford Aase, M.D-ENT, Disp: , Rfl:  .  fluticasone furoate-vilanterol (BREO ELLIPTA) 200-25 MCG/INH AEPB, Inhale 1 puff into the lungs daily. Rinse mouth after use., Disp: 1 each, Rfl: 5 .  furosemide (LASIX) 20 MG tablet, Take 1 tablet (20 mg total) by mouth 2 (two) times daily for 10 days., Disp: 20 tablet, Rfl: 0 .  Glucose Blood (BLOOD GLUCOSE TEST STRIPS) STRP, , Disp: , Rfl:  .  Insulin Glargine (LANTUS SOLOSTAR) 100 UNIT/ML Solostar Pen, Inject 50 Units into the skin daily at 10 pm. (Patient taking differently: Inject 68 Units daily at 10 pm into the skin. ), Disp: 15 mL, Rfl: 2 .  Insulin Pen Needle (RELION PEN NEEDLE 31G/8MM) 31G X 8 MM MISC, by Does not apply route 2 (two) times daily., Disp: , Rfl:  .  insulin regular (NOVOLIN R,HUMULIN R) 100 units/mL injection, Inject 0.1 mLs (10 Units total) into the skin 3 (three) times  daily before meals. (Patient taking differently: Inject 36 Units 3 (three) times daily before meals into the skin. ), Disp: 10 mL, Rfl: 0 .  levothyroxine (SYNTHROID, LEVOTHROID) 25 MCG tablet, TAKE 1 TABLET EVERY DAY  EXCEPT TAKE 2 TABLETS  ON  SUNDAYS, Disp: 100 tablet, Rfl: 0 .  metFORMIN (GLUCOPHAGE-XR) 500 MG 24 hr tablet, Take 1 tablet (500 mg total) by mouth daily with breakfast., Disp: 180 tablet, Rfl: 1 .  metoprolol tartrate (LOPRESSOR) 25 MG tablet, TAKE 1 TABLET TWICE DAILY, Disp: 180 tablet, Rfl: 1 .  montelukast (SINGULAIR) 10 MG tablet, TAKE 1 TABLET AT BEDTIME, Disp: 90 tablet, Rfl: 1 .  omeprazole (PRILOSEC) 40 MG capsule, TAKE 1 CAPSULE (40 MG TOTAL) BY MOUTH DAILY., Disp: 90 capsule, Rfl: 1 .  Potassium 99 MG TABS, Take 1 tablet by mouth daily., Disp: , Rfl:  .  pregabalin (LYRICA) 300 MG capsule, Take 2 capsules (600 mg  total) by mouth daily., Disp: 180 capsule, Rfl: 1 .  ranitidine (ZANTAC) 150 MG tablet, Take 150 mg by mouth at bedtime., Disp: , Rfl:  .  RELION INSULIN SYRINGE 1ML/31G 31G X 5/16" 1 ML MISC, , Disp: , Rfl:  .  rosuvastatin (CRESTOR) 20 MG tablet, Take 20 mg by mouth daily., Disp: , Rfl:  .  VENTOLIN HFA 108 (90 Base) MCG/ACT inhaler, Inhale 2 puffs into the lungs every 6 (six) hours as needed., Disp: , Rfl:  .  vitamin C (ASCORBIC ACID) 500 MG tablet, Take 500 mg by mouth daily., Disp: , Rfl:  .  VITAMIN D, CHOLECALCIFEROL, PO, Take 20,000 Units by mouth daily., Disp: , Rfl:  .  valsartan-hydrochlorothiazide (DIOVAN-HCT) 320-25 MG tablet, Take 1 tablet by mouth daily., Disp: 90 tablet, Rfl: 1  Allergies  Allergen Reactions  . Codeine Other (See Comments)    "TRIPPED OUT"  DIDN'T LIKE THE [place]  . Atorvastatin     muscle pain  . Latex Rash  . Zolpidem Other (See Comments)    Sleep walk    ROS  Ten systems reviewed and is negative except as mentioned in HPI   Objective  Vitals:   10/04/17 0944 10/04/17 1014  BP: (!) 152/74 124/68  Pulse:  (!) 107   Resp: 20   Temp: 97.8 F (36.6 C)   TempSrc: Oral   SpO2: 97%   Weight: 252 lb 1.6 oz (114.4 kg)   Height: '5\' 2"'$  (1.575 m)     Body mass index is 46.11 kg/m.    Physical Exam  Constitutional: Patient appears well-developed and well-nourished. Obese  HEENT: head atraumatic, normocephalic, pupils equal and reactive to light,  neck supple, throat within normal limits Cardiovascular: Normal rate, regular rhythm and normal heart sounds.  No murmur heard. 1 plus pitting BLE edema. Pulmonary/Chest: Effort normal and breath sounds normal. In mild  respiratory distress. Abdominal: Soft.  There is no tenderness. Psychiatric: Patient has a normal mood and affect. behavior is normal. Judgment and thought content normal.  Recent Results (from the past 2160 hour(s))  COMPLETE METABOLIC PANEL WITH GFR     Status: Abnormal   Collection Time: 08/31/17  3:32 PM  Result Value Ref Range   Glucose, Bld 171 (H) 65 - 99 mg/dL    Comment: .            Fasting reference interval . For someone without known diabetes, a glucose value >125 mg/dL indicates that they may have diabetes and this should be confirmed with a follow-up test. .    BUN 29 (H) 7 - 25 mg/dL   Creat 1.17 (H) 0.50 - 0.99 mg/dL    Comment: For patients >71 years of age, the reference limit for Creatinine is approximately 13% higher for people identified as African-American. .    GFR, Est Non African American 48 (L) > OR = 60 mL/min/1.36m   GFR, Est African American 55 (L) > OR = 60 mL/min/1.726m  BUN/Creatinine Ratio 25 (H) 6 - 22 (calc)   Sodium 139 135 - 146 mmol/L   Potassium 4.5 3.5 - 5.3 mmol/L   Chloride 103 98 - 110 mmol/L   CO2 26 20 - 32 mmol/L   Calcium 10.1 8.6 - 10.4 mg/dL   Total Protein 6.3 6.1 - 8.1 g/dL   Albumin 4.3 3.6 - 5.1 g/dL   Globulin 2.0 1.9 - 3.7 g/dL (calc)   AG Ratio 2.2 1.0 - 2.5 (calc)  Total Bilirubin 0.3 0.2 - 1.2 mg/dL   Alkaline phosphatase (APISO) 79 33 - 130 U/L   AST  19 10 - 35 U/L   ALT 28 6 - 29 U/L  CBC     Status: None   Collection Time: 08/31/17  3:32 PM  Result Value Ref Range   WBC 10.2 3.8 - 10.8 Thousand/uL   RBC 4.32 3.80 - 5.10 Million/uL   Hemoglobin 12.4 11.7 - 15.5 g/dL   HCT 36.5 35.0 - 45.0 %   MCV 84.5 80.0 - 100.0 fL   MCH 28.7 27.0 - 33.0 pg   MCHC 34.0 32.0 - 36.0 g/dL   RDW 14.9 11.0 - 15.0 %   Platelets 271 140 - 400 Thousand/uL   MPV 11.4 7.5 - 12.5 fL  COMPLETE METABOLIC PANEL WITH GFR     Status: Abnormal   Collection Time: 10/02/17 10:50 AM  Result Value Ref Range   Glucose, Bld 158 (H) 65 - 99 mg/dL    Comment: .            Fasting reference interval . For someone without known diabetes, a glucose value >125 mg/dL indicates that they may have diabetes and this should be confirmed with a follow-up test. .    BUN 22 7 - 25 mg/dL   Creat 0.96 0.50 - 0.99 mg/dL    Comment: For patients >35 years of age, the reference limit for Creatinine is approximately 13% higher for people identified as African-American. .    GFR, Est Non African American 61 > OR = 60 mL/min/1.50m   GFR, Est African American 70 > OR = 60 mL/min/1.776m  BUN/Creatinine Ratio NOT APPLICABLE 6 - 22 (calc)   Sodium 140 135 - 146 mmol/L   Potassium 4.4 3.5 - 5.3 mmol/L   Chloride 103 98 - 110 mmol/L   CO2 27 20 - 32 mmol/L   Calcium 10.0 8.6 - 10.4 mg/dL   Total Protein 6.4 6.1 - 8.1 g/dL   Albumin 4.3 3.6 - 5.1 g/dL   Globulin 2.1 1.9 - 3.7 g/dL (calc)   AG Ratio 2.0 1.0 - 2.5 (calc)   Total Bilirubin 0.4 0.2 - 1.2 mg/dL   Alkaline phosphatase (APISO) 87 33 - 130 U/L   AST 21 10 - 35 U/L   ALT 24 6 - 29 U/L  B Nat Peptide     Status: None   Collection Time: 10/02/17 10:50 AM  Result Value Ref Range   Brain Natriuretic Peptide 20 <100 pg/mL    Comment: . BNP levels increase with age in the general population with the highest values seen in individuals greater than 7568ears of age. Reference: J. Am. [place] ArCardiol. 2002; ;  19:147-829. Marland Kitchen TSH     Status: None   Collection Time: 10/02/17 10:50 AM  Result Value Ref Range   TSH 2.01 0.40 - 4.50 mIU/L     Assessment & Plan  1. SOB (shortness of breath)  Slightly better, negative BNAP, on antibiotics given by ENT - for sinusitis  2. Oral candidiasis  Seen by ENT and started on medication this week   3. Edema extremities  Slightly better, still not wearing compression stocking hoses, but advised to have her boyfriend massage her leg, keep it elevated and try wearing compression stocking hoses, kidney function back to normal   4. MDD (major depressive disorder), recurrent episode, moderate ([place] She is waiting on phone call from psychiatrist.   5. Uncontrolled hypertension  -  valsartan-hydrochlorothiazide (DIOVAN-HCT) 320-25 MG tablet; Take 1 tablet by mouth daily.  Dispense: 90 tablet; Refill: 1

## 2017-10-04 NOTE — Patient Outreach (Addendum)
[facility]) Care Management  [place]   10/04/2017  Summer [name] 1951/08/18 [id number]   67 year old female referred to [place] Management by [facility] for frequent ED visits.  [place] services requested for medication assistance with inhalers.  PMHx includes, but not limited to, chronic kidney disease stage III, type 2 diabetes mellitus with neuropathy, HTN, HLD, HFpEF, asthma, hypothyroidism, depression, obesity, memory loss, and allergies.   Subjective:  Successful outreach call to Summer [name] today.  HIPAA identifiers verified. Summer [name] is agreeable to [facility] pharmacy services.  She reports that she cannot afford her Breo Ellipta inhaler and that she was told it would be "$200-$300 around Christmas."  She received samples of Breo today from her provider, Dr. Ancil Boozer, that will last 6 weeks.  She reports she is also on an albuterol inhaler that she can afford.  Summer [name] confirms her insurance if [facility].  She states that she was denied Extra Help due to resources over required limit as she owns a bar.  She estimates her monthly income is $1100 from social security and $250-500 from her bar.  She reports she uses Industrial/product designer" and "My pharmacist" for help with obtaining Lyrica and Lantus at no charge.    Objective:  SCr = 0.96 (10/02/17), CrCl = 100 ml/min  Encounter Medications: Outpatient Encounter Medications as of 10/04/2017  Medication Sig Note  . ACCU-CHEK SOFTCLIX LANCETS lancets    . acetaminophen (TYLENOL 8 HOUR) 650 MG CR tablet Take 1 tablet (650 mg total) by mouth every 8 (eight) hours as needed for pain.   Marland Kitchen albuterol (PROVENTIL HFA) 108 (90 Base) MCG/ACT inhaler Inhale 2 puffs into the lungs every 6 (six) hours as needed.   Marland Kitchen amitriptyline (ELAVIL) 50 MG tablet TAKE 1/2 TO 1 TABLET AT BEDTIME   . ASPIRIN LOW DOSE 81 MG EC tablet TAKE 1 TABLET (81 MG TOTAL) BY MOUTH DAILY.   Marland Kitchen Blood Glucose Monitoring Suppl (ACCU-CHEK AVIVA PLUS) w/Device KIT  01/04/2016:  Received from: External Pharmacy  . cefUROXime (CEFTIN) 250 MG tablet Take 250 mg by mouth 2 (two) times daily with a meal. Melford Aase, M.D-ENT   . clotrimazole (MYCELEX) 10 MG troche Take 10 mg by mouth 2 (two) times daily. Melford Aase, M.D- ENT   . diclofenac sodium (VOLTAREN) 1 % GEL Apply topically as needed. Reported on 02/22/2016 02/22/2016: PRN  . fluconazole (DIFLUCAN) 100 MG tablet Take 100 mg by mouth daily. Melford Aase, M.D-ENT   . fluticasone furoate-vilanterol (BREO ELLIPTA) 200-25 MCG/INH AEPB Inhale 1 puff into the lungs daily. Rinse mouth after use.   . furosemide (LASIX) 20 MG tablet Take 1 tablet (20 mg total) by mouth 2 (two) times daily for 10 days.   . Glucose Blood (BLOOD GLUCOSE TEST STRIPS) STRP  07/20/2015: Received from: [facility]  . Insulin Glargine (LANTUS SOLOSTAR) 100 UNIT/ML Solostar Pen Inject 50 Units into the skin daily at 10 pm. (Patient taking differently: Inject 68 Units daily at 10 pm into the skin. )   . Insulin Pen Needle (RELION PEN NEEDLE 31G/8MM) 31G X 8 MM MISC by Does not apply route 2 (two) times daily.   . insulin regular (NOVOLIN R,HUMULIN R) 100 units/mL injection Inject 0.1 mLs (10 Units total) into the skin 3 (three) times daily before meals. (Patient taking differently: Inject 36 Units 3 (three) times daily before meals into the skin. )   . levothyroxine (SYNTHROID, LEVOTHROID) 25 MCG tablet TAKE  1 TABLET EVERY DAY  EXCEPT TAKE 2 TABLETS  ON  SUNDAYS   . metFORMIN (GLUCOPHAGE-XR) 500 MG 24 hr tablet Take 1 tablet (500 mg total) by mouth daily with breakfast.   . metoprolol tartrate (LOPRESSOR) 25 MG tablet TAKE 1 TABLET TWICE DAILY   . montelukast (SINGULAIR) 10 MG tablet TAKE 1 TABLET AT BEDTIME   . omeprazole (PRILOSEC) 40 MG capsule TAKE 1 CAPSULE (40 MG TOTAL) BY MOUTH DAILY.   Marland Kitchen Potassium 99 MG TABS Take 1 tablet by mouth daily.   . pregabalin (LYRICA) 300 MG capsule Take 2 capsules (600 mg total) by mouth daily.   .  ranitidine (ZANTAC) 150 MG tablet Take 150 mg by mouth at bedtime. 08/31/2017: ER doctor told the pt stop taking  . RELION INSULIN SYRINGE 1ML/31G 31G X 5/16" 1 ML MISC  01/04/2016: Received from: External Pharmacy  . rosuvastatin (CRESTOR) 20 MG tablet Take 20 mg by mouth daily.   . valsartan-hydrochlorothiazide (DIOVAN-HCT) 320-25 MG tablet Take 1 tablet by mouth daily.   . VENTOLIN HFA 108 (90 Base) MCG/ACT inhaler Inhale 2 puffs into the lungs every 6 (six) hours as needed.   . vitamin C (ASCORBIC ACID) 500 MG tablet Take 500 mg by mouth daily.   Marland Kitchen VITAMIN D, CHOLECALCIFEROL, PO Take 20,000 Units by mouth daily.    No facility-administered encounter medications on file as of 10/04/2017.     Functional Status: In your present state of health, do you have any difficulty performing the following activities: 10/02/2017 09/28/2017  Hearing? N N  Vision? N N  Difficulty concentrating or making decisions? N N  Comment - -  Walking or climbing stairs? Y Y  Comment - -  Dressing or bathing? N N  Doing errands, shopping? N N  Preparing Food and eating ? - N  Using the Toilet? - N  In the past six months, have you accidently leaked urine? - N  Do you have problems with loss of bowel control? - N  Managing your Medications? - N  Managing your Finances? - N  Housekeeping or managing your Housekeeping? - N  Some recent data might be hidden    Fall/Depression Screening: Fall Risk  10/02/2017 09/28/2017 08/31/2017  Falls in the past year? No No No  Comment - - -  Number falls in past yr: - - -  Injury with Fall? - - -  Risk Factor Category  - - -  Comment - - -  Risk for fall due to : - - -  Risk for fall due to: Comment - - -  Follow up - - -   PHQ 2/9 Scores 09/28/2017 09/21/2017 08/31/2017 06/27/2017 05/18/2017 09/19/2016 08/18/2016  PHQ - 2 Score 4 4 0 6 0 2 6  PHQ- 9 Score 18 18 - 24 - 3 21    Assessment:   Drugs sorted by system:  Neurologic/Psychologic: amitriptyline,  pregabalin  Cardiovascular: aspirin '81mg'$ , furosemide, metoprolol, rosuvastatin, valsartan-HCTZ  Pulmonary/Allergy: fluticasone-vilanterol (Breo Ellipta), albuterol (Venotlin), montelukast  Gastrointestinal: omeprazole, ranitidine  Endocrine: Lantus, Novolin R, levothyroxine, metformin  Renal: none  Topical: diclofenac gel  Pain: acetaminophen  Vitamins/Minerals: potassium, vitamin C, vitamin D  Infectious Diseases: cefuroxime, clotrimazole, fluconazole  Miscellaneous: none  Duplications in therapy:  No issues  Gaps in therapy:  No issues  Medications to avoid in the elderly:  Amitriptyline: Per the Beers list, this medication has been identified as a potentially inappropriate medication to be avoided in patients 65 years and  older (independent of diagnosis or condition) due to its strong anticholinergic properties and potential for sedation and orthostatic hypotension.  Per the Beers list, this medication may cause or exacerbate syndrome of inappropriate antidiuretic hormone secretion or hyponatremia. Use with caution in this patient population. Monitoring of sodium levels when starting treatment or changing dosages is recommended. Per Beers list, medications with strong anticholinergic properties should be avoided in older adults with dementia and cognitive impairment, those with or at high risk of delirium, and those with lower urinary tract symptoms including benign prostatic hyperplasia.   Drug interactions: no issues  Other issues noted:   Change with blood pressure medication today at Dr. Ancil Boozer office.  Start Valsartan-HCTZ 390m - 257m  Stop Losartan-HCTZ.   Patient reports she is going to pick up this new medication today and knows to stop losartan-HCTZ.   Patient is unsure how she takes her metformin and will check on this when she is at home.     Allergy noted to atorvastatin.  Patient is tolerating rosuvastatin currently.    Patient reports 3 week course of  antibiotics however sinusitis is usually a 10-14 day course.  Patient will look into quantity of medication when she is at home.    Medication assistance:  Per [facility], patient has not filled Lantus, Lyrica, Breo, or Voltaren gel and last picked up 3 vials of Relion N ([facility] brand of Novolin N) in October 2018.  She is picking up Ventolin inhaler regularly. She uses [facility] for Tier 1-2 medications.   Patient using other companies to assist with Lantus and Lyrica PAPs.  She is requesting assistance with inhaler costs.    Patient denied for Extra Help due to assets.     Patient assistance programs:  Br[employer] Patterade by [place] requires $600 TROOP.  Patient has not met this TROOP yet.  Dulera made by [employer] no TROOP.  Patient agreeable to apply for this inhaler if approved by provider.  [employer] also has program for Proventil (albuterol inhaler) which patient could also apply for.    I reviewed the [facility] catalogue with patient for more cost-savings options.  Patient requested that I mail her a copy of the catalogue.   I provided her with the phone number to [facility] she has more specific questions.     Plan: I will route my note to Dr. SoAncil Boozeregarding BrMemory Dance-> Dulera substitution and Ventolin --> Proventil substitution and if approved, will begin application process.  I will mail patient a copy of the [facility].   CoRalene BathePharmD, [phone]

## 2017-10-05 ENCOUNTER — Ambulatory Visit: Payer: Self-pay | Admitting: Pharmacist

## 2017-10-05 ENCOUNTER — Other Ambulatory Visit: Payer: Self-pay | Admitting: Family Medicine

## 2017-10-05 ENCOUNTER — Encounter: Payer: Self-pay | Admitting: Pharmacist

## 2017-10-05 DIAGNOSIS — G44229 Chronic tension-type headache, not intractable: Secondary | ICD-10-CM

## 2017-10-05 DIAGNOSIS — G4709 Other insomnia: Secondary | ICD-10-CM

## 2017-10-05 MED ORDER — TELMISARTAN-HCTZ 80-12.5 MG PO TABS: 1.0000 | ORAL_TABLET | Freq: Every day | ORAL | 0 refills | Status: DC

## 2017-10-05 MED ORDER — AMITRIPTYLINE HCL 25 MG PO TABS: 25.0000 mg | ORAL_TABLET | Freq: Every day | ORAL | 1 refills | Status: DC

## 2017-10-05 NOTE — Patient Outreach (Addendum)
[facility]) Care Management  10/05/2017  NATHALI VENT January 02, 1951 [id number]  Dr. Ancil Boozer has approved substitution from [facility] -- > Dulera and Ventolin --> Proventil so that patient may apply to patient assistance program to receive medications at no charge through 2019.   Successful call to patient. HIPAA identifiers verified.  Patient aware to look for application in the mail and return to [facility].    Plan: I will route patient assistance letter to pharmacy technician, Etter Sjogren, who will coordinate application process.  She will assist with obtaining all pertinent documents from both patient and Dr. Ancil Boozer and submit application to [employer] once completed.    Ralene Bathe, PharmD, [place] [phone]

## 2017-10-05 NOTE — Telephone Encounter (Signed)
Refill request for general medication. Amitriptyline to [facility].   Last office visit: 10/04/2017  Follow up on 11/02/2017

## 2017-10-08 ENCOUNTER — Telehealth: Payer: Self-pay | Admitting: Family Medicine

## 2017-10-08 MED ORDER — IRBESARTAN-HYDROCHLOROTHIAZIDE 150-12.5 MG PO TABS
1.0000 | ORAL_TABLET | Freq: Every day | ORAL | 0 refills | Status: DC
Start: 2017-10-08 — End: 2017-11-02

## 2017-10-08 NOTE — Telephone Encounter (Signed)
Spoke with pharmacist at [facility] and her Insurance requires patient to try Irbesartan-HCTZ first before taking Telmisartan-HCTZ.

## 2017-10-08 NOTE — Telephone Encounter (Signed)
changed

## 2017-10-08 NOTE — Telephone Encounter (Signed)
Copied from [place] [phone]. Topic: Quick Communication - See Telephone Encounter >> Oct 08, 2017  9:40 AM Ether Griffins B wrote: CRM for notification. See Telephone encounter for:  Pt was taking losartan and was told to stop taking it and another medication would be called in. What was called in the amitriptyline she is already taking. Pt doesn't understand and what was called in is a lower dose. She is on a 50 mg right now and 25 mg was called in.  10/08/17.

## 2017-10-08 NOTE — Telephone Encounter (Signed)
Patient notified about BP medication change due to PA and Amitriptyline was lower due to high risk taking with all of her other medications.

## 2017-10-09 ENCOUNTER — Other Ambulatory Visit: Payer: Self-pay | Admitting: Pharmacy Technician

## 2017-10-09 NOTE — Patient Outreach (Signed)
[facility]) Care Management  10/09/2017  MICHELL GIULIANO March 14, 1951 [id number]   Successful outreach call to Ms. Gatt. Informed patient that I mailed her the patient portion or [employer] patient assistance application. Requested she contact me when she has mailed it back into me.  Will follow up in 10 days if I have not received a call from her.  Maud Deed [place], [place] Management [phone]

## 2017-10-10 DIAGNOSIS — E1122 Type 2 diabetes mellitus with diabetic chronic kidney disease: Secondary | ICD-10-CM | POA: Diagnosis not present

## 2017-10-10 DIAGNOSIS — E785 Hyperlipidemia, unspecified: Secondary | ICD-10-CM | POA: Diagnosis not present

## 2017-10-10 DIAGNOSIS — I509 Heart failure, unspecified: Secondary | ICD-10-CM | POA: Diagnosis not present

## 2017-10-10 DIAGNOSIS — E1165 Type 2 diabetes mellitus with hyperglycemia: Secondary | ICD-10-CM | POA: Diagnosis not present

## 2017-10-10 DIAGNOSIS — Z794 Long term (current) use of insulin: Secondary | ICD-10-CM | POA: Diagnosis not present

## 2017-10-10 DIAGNOSIS — Z6841 Body Mass Index (BMI) 40.0 and over, adult: Secondary | ICD-10-CM | POA: Diagnosis not present

## 2017-10-10 DIAGNOSIS — E039 Hypothyroidism, unspecified: Secondary | ICD-10-CM | POA: Diagnosis not present

## 2017-10-10 DIAGNOSIS — I130 Hypertensive heart and chronic kidney disease with heart failure and stage 1 through stage 4 chronic kidney disease, or unspecified chronic kidney disease: Secondary | ICD-10-CM | POA: Diagnosis not present

## 2017-10-10 DIAGNOSIS — E1142 Type 2 diabetes mellitus with diabetic polyneuropathy: Secondary | ICD-10-CM | POA: Diagnosis not present

## 2017-10-10 DIAGNOSIS — N183 Chronic kidney disease, stage 3 (moderate): Secondary | ICD-10-CM | POA: Diagnosis not present

## 2017-10-10 DIAGNOSIS — G4700 Insomnia, unspecified: Secondary | ICD-10-CM | POA: Diagnosis not present

## 2017-10-10 DIAGNOSIS — Z7982 Long term (current) use of aspirin: Secondary | ICD-10-CM | POA: Diagnosis not present

## 2017-10-12 ENCOUNTER — Ambulatory Visit: Payer: Self-pay

## 2017-10-12 ENCOUNTER — Other Ambulatory Visit: Payer: Self-pay | Admitting: *Deleted

## 2017-10-12 ENCOUNTER — Other Ambulatory Visit: Payer: Self-pay

## 2017-10-12 ENCOUNTER — Telehealth: Payer: Self-pay | Admitting: Family Medicine

## 2017-10-12 NOTE — Telephone Encounter (Signed)
Patient is unsure what she is taking and why ( the inhalers) since she is on a lot of medication can you please inform me what to tell the patient.

## 2017-10-12 NOTE — Telephone Encounter (Signed)
Summer [name] spoke to patient, it was mailed to her, she does not know why. Needs to bring to next visit and contact mail order

## 2017-10-12 NOTE — Patient Outreach (Signed)
[facility]) Care Management  10/12/2017  REBA HULETT May 05, 1951 [id number]   Follow up phone call regarding counseling resources provided for patient. Per patient, she took the list of counselors provided by this social worker to her doctor's appointment and was told that seeing a psychiatrist would be recommended at this time.  Patient has an appointment to see a psychiatrist at the [facility] on 10/30/17. Per patient, she plans on keeping this appointment. This Education officer, museum explained that there are also options to see a therapist there if needed as well. Patient verbalized having no additional social work needs at this time. This social worker's contact information provided if any issues arise in the future.   Sheralyn Boatman [facility] Care Management [phone]

## 2017-10-12 NOTE — Telephone Encounter (Signed)
Copied from [place] [phone]. Topic: Quick Communication - See Telephone Encounter >> Oct 12, 2017  2:15 PM Summer [name] wrote: CRM for notification. See Telephone encounter for:   10/12/17. Patient said she received a script in the mail for albuterol (PROVENTIL HFA) 108 (90 Base) MCG/ACT inhaler & another inhaler. She wants to know why those were sent to her in the mail and what she is suppose to do with them? Call back [phone]

## 2017-10-12 NOTE — Patient Outreach (Signed)
[facility]) Care Management  10/12/2017  SASHAY FELLING Nov 18, 1950 [id number]   Telephone call to patient for follow up call.  No answer. HIPAA compliant voice message left.  Plan: RN CM will notify assigned CM of phone call attempt.  Jone Baseman, RN, MSN [facility] Care Management Care Management Coordinator Direct Line [phone] Toll Free: [phone]  Fax: [phone]

## 2017-10-15 ENCOUNTER — Other Ambulatory Visit: Payer: Self-pay | Admitting: Family Medicine

## 2017-10-15 NOTE — Telephone Encounter (Signed)
Refill request for general medication: Amitriptyline 25 mg  Last office visit: 10/04/2017  Last physical exam: None indicated  Follow-up on file. 11/02/2017

## 2017-10-17 ENCOUNTER — Other Ambulatory Visit: Payer: Self-pay

## 2017-10-17 NOTE — Patient Outreach (Signed)
[facility]) Care Management  10/17/2017  ALEESA SWEIGERT 29-Nov-1950 [id number]   Initial telephone assessment Referral date:09/20/17 Referral source:[employer] direct referral Referral reason:Frequent emergency department visits Insurance:Humana PCP: Dr. Ancil Boozer Podiatrist:  Dr. Pricilla Holm Endocrinologist Dr. Alene Mires ENT - [facility])  Telephone call to patient regarding assessment follow up. HIPAA verified with patient. Patient states she has spoken with the [facility] care management social worker and pharmacist. Patient reports she received the information provided by the social worker and the catalogue sent by the pharmacist. Patient reports her doctor gave her a substitute for her breathing medication, albuterol.  Patient states this is more affordable for her. Patient reports she has an appointment with the psychiatrist on 10/30/17.  Patient states she saw the doctor regarding the thumping in her ear. States the doctor advised her to put 1 ml of water in her right ear daily.  Patient states this has lessened her symptoms considerably. Patient states per her doctor these symptoms are a side effect of the medication she is taking for the fungus in her mouth. Patient states she continues to take medication for the fungus. Patient states she has a follow up appointment with her ENT doctor in March 2019. Patient states her blood sugars continue to run consistently in the 200's.  Patient states she has an appointment with her endocrinologist in March 2019.  RNCM advised patient to contact her endocrinologist office and report her elevated blood sugars.  Patient verbalized agreement and understanding.  Patient states she saw her primary MD.  States the doctor gave her a 10 day course of lasix due  To the swelling in her feet and the weight gain. Patient states she had lost approximately 10 lbs. Patient reports her current weight is 247 lbs. Patient states she has been instructed to watch her  sodium level. Patient states she has been advised by her doctor to check her weight every couple of days. RNCM advise patient to take note of a 5 lbs increase in her weight within a week or a 3 lbs increase overnight. Advised patient to call her doctor if she sees this weight increase. Patient verbalized understanding. RNCM informed patient she would send her EMMI education material regarding low sodium diet.  RNCM advised patient to watch for increase swelling in her feet, ankles, hands, legs or abdomen as well as increase shortness of breath. Advised to call her doctor for these symptoms. Patient verbalized understanding.  Patient states she did not receive the weight management EMMI article.  RNCM will resend.  RNCM discussed home/ chair exercises with patient.  Patient states she was not able to call her insurance company to inquire about nutritionist coverage.  RNCM requested she call prior to next telephone outreach. Patient verbalized understanding.  Patient reports she received her [facility] welcome packet / consent in the mail.  Patient states she thinks she returned the consent form by mail to the [facility] office.  RNCM advised patient to continue to take her medications as prescribed. RNCM confirmed patient has contact phone number for 24 hours nurse advise line as well as RNCM contact number.  Patient verbally agreed to next telephone outreach with [facility].   PLAN: RNCM will follow up with patient within 2 weeks.  RNCM will resent weight management EMMI and EMMI information on low salt diet.  Patient will contact her insurance for nutritionist/ dietician benefit coverage.   Quinn Plowman RN,BSN,CCM [facility] Telephonic  [phone]

## 2017-10-18 ENCOUNTER — Other Ambulatory Visit: Payer: Self-pay | Admitting: Pharmacy Technician

## 2017-10-18 NOTE — Patient Outreach (Signed)
[place] [place] [facility]) Care Management  10/18/2017  Summer [name] 03-20-1951 [id number]   Received patient and provider portions of [employer] application.   Mailed completed application to [employer].  [name], [place] Management [phone]

## 2017-10-22 ENCOUNTER — Ambulatory Visit: Payer: Self-pay | Admitting: Pharmacy Technician

## 2017-10-30 ENCOUNTER — Ambulatory Visit: Payer: Medicare HMO | Admitting: Psychiatry

## 2017-10-30 ENCOUNTER — Other Ambulatory Visit: Payer: Self-pay

## 2017-10-30 ENCOUNTER — Encounter: Payer: Self-pay | Admitting: Psychiatry

## 2017-10-30 VITALS — BP 176/76 | HR 105 | Temp 98.5°F | Wt 253.4 lb

## 2017-10-30 DIAGNOSIS — F419 Anxiety disorder, unspecified: Secondary | ICD-10-CM

## 2017-10-30 DIAGNOSIS — Z9989 Dependence on other enabling machines and devices: Secondary | ICD-10-CM | POA: Diagnosis not present

## 2017-10-30 DIAGNOSIS — F331 Major depressive disorder, recurrent, moderate: Secondary | ICD-10-CM

## 2017-10-30 DIAGNOSIS — G4733 Obstructive sleep apnea (adult) (pediatric): Secondary | ICD-10-CM

## 2017-10-30 MED ORDER — DULOXETINE HCL 30 MG PO CPEP: 30.0000 mg | ORAL_CAPSULE | Freq: Every day | ORAL | 1 refills | Status: DC

## 2017-10-30 NOTE — Patient Instructions (Signed)
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
## 2017-10-31 ENCOUNTER — Encounter: Payer: Self-pay | Admitting: Psychiatry

## 2017-11-01 ENCOUNTER — Other Ambulatory Visit: Payer: Self-pay

## 2017-11-01 NOTE — Patient Outreach (Signed)
[facility]) Care Management  11/01/2017  Summer [name] 07/14/1951 [id number]    telephone assessment Referral date:09/20/17 Referral source:[employer] direct referral Referral reason:Frequent emergency department visits Insurance:[employer] PCP: Dr. Ancil Boozer Podiatrist:  Dr. Pricilla Holm Attempt #1  Telephone call to patient regarding assessment follow up. Unable to reach patient. HIPAA compliant voice message left with call back phone number.   PLAN:  RNCM will attempt 2nd telephone call to patient within 3 business days.   Quinn Plowman RN,BSN,CCM [facility] Telephonic  [phone] '

## 2017-11-02 ENCOUNTER — Encounter: Payer: Self-pay | Admitting: Family Medicine

## 2017-11-02 ENCOUNTER — Other Ambulatory Visit: Payer: Self-pay | Admitting: Family Medicine

## 2017-11-02 ENCOUNTER — Ambulatory Visit (INDEPENDENT_AMBULATORY_CARE_PROVIDER_SITE_OTHER): Payer: Medicare HMO | Admitting: Family Medicine

## 2017-11-02 VITALS — BP 158/80 | HR 91 | Resp 14 | Ht 62.0 in | Wt 251.3 lb

## 2017-11-02 DIAGNOSIS — R600 Localized edema: Secondary | ICD-10-CM

## 2017-11-02 DIAGNOSIS — E1140 Type 2 diabetes mellitus with diabetic neuropathy, unspecified: Secondary | ICD-10-CM | POA: Diagnosis not present

## 2017-11-02 DIAGNOSIS — F331 Major depressive disorder, recurrent, moderate: Secondary | ICD-10-CM | POA: Diagnosis not present

## 2017-11-02 DIAGNOSIS — J4540 Moderate persistent asthma, uncomplicated: Secondary | ICD-10-CM | POA: Diagnosis not present

## 2017-11-02 DIAGNOSIS — I10 Essential (primary) hypertension: Secondary | ICD-10-CM

## 2017-11-02 DIAGNOSIS — H9313 Tinnitus, bilateral: Secondary | ICD-10-CM | POA: Diagnosis not present

## 2017-11-02 DIAGNOSIS — R05 Cough: Secondary | ICD-10-CM

## 2017-11-02 DIAGNOSIS — K219 Gastro-esophageal reflux disease without esophagitis: Secondary | ICD-10-CM

## 2017-11-02 DIAGNOSIS — I152 Hypertension secondary to endocrine disorders: Secondary | ICD-10-CM | POA: Diagnosis not present

## 2017-11-02 DIAGNOSIS — E1165 Type 2 diabetes mellitus with hyperglycemia: Secondary | ICD-10-CM

## 2017-11-02 DIAGNOSIS — G4700 Insomnia, unspecified: Secondary | ICD-10-CM | POA: Diagnosis not present

## 2017-11-02 DIAGNOSIS — I5030 Unspecified diastolic (congestive) heart failure: Secondary | ICD-10-CM | POA: Diagnosis not present

## 2017-11-02 DIAGNOSIS — G4733 Obstructive sleep apnea (adult) (pediatric): Secondary | ICD-10-CM | POA: Diagnosis not present

## 2017-11-02 DIAGNOSIS — R059 Cough, unspecified: Secondary | ICD-10-CM

## 2017-11-02 LAB — POCT GLYCOSYLATED HEMOGLOBIN (HGB A1C): Hemoglobin A1C: 8.8

## 2017-11-02 MED ORDER — FUROSEMIDE 20 MG PO TABS: 20.0000 mg | ORAL_TABLET | Freq: Two times a day (BID) | ORAL | 0 refills | Status: DC

## 2017-11-02 MED ORDER — IRBESARTAN-HYDROCHLOROTHIAZIDE 300-12.5 MG PO TABS: 1.0000 | ORAL_TABLET | Freq: Every day | ORAL | 0 refills | Status: DC

## 2017-11-02 NOTE — Progress Notes (Signed)
Name: Summer [name]   MRN: [id number]    DOB: 10-02-1950   Date:11/02/2017       Progress Note  Subjective  Chief Complaint  Chief Complaint  Patient presents with  . Hypertension  . Shortness of Breath  . Depression    HPI  Edema/SOB/weight gain/Chronic chf: she has a history of CKI, hypothyroidism, CHF with normal EF and sleep apnea. . She was seen about one month ago and had gained a lot of weight, was given lasix and she noticed an improvement, less swelling and felt better overall. However she is out of medication and would like to resume it. Discussed the risk of long term diuretics, we will give her a refill for prn use  Cough/ tinnitus: seeing ENT at [facility] and needs a referral for follow up. Finished anti-fungal medication given by him.   Depression: she had a very high phQ9, she is seeing psychiatrist again, given rx of Duloxetine but has not filled it yet, she is also going to start seeing therapist.   HTN: bp has been elevated here and at home, taking medication as prescribed, no side effects .   DMII: seeing Dr. Honor Junes, advised to no longer get HgbA1C with [place], hgbA1C is still above goal at 8.8%, she states not compliant with diet, she has been eating cookies. Advised her to try to walk a little more. Triglycerides also high, on ARB, statin therapy.    Patient Active Problem List   Diagnosis Date Noted  . (HFpEF) heart failure with preserved ejection fraction ([place]) 08/31/2017  . Moderate persistent asthma 08/31/2017  . Hyperlipidemia due to type 2 diabetes mellitus ([place]) 06/14/2017  . Chest pain of unknown etiology 10/06/2016  . Essential hypertension 10/06/2016  . Morbid obesity with BMI of 40.0-44.9, adult ([place]) 10/06/2016  . Dyslipidemia associated with type 2 diabetes mellitus ([place]) 08/18/2016  . Charcot foot due to diabetes mellitus ([place]) 02/22/2016  . Hypertriglyceridemia 07/27/2015  . Acquired abduction deformity of foot 07/12/2015  . Osteoarthritis of  subtalar joint 07/12/2015  . Poorly controlled type 2 diabetes mellitus with neuropathy ([place]) 07/12/2015  . Arthritis of foot, degenerative 07/12/2015  . Carpal tunnel syndrome 04/17/2015  . Chronic constipation 04/17/2015  . Insomnia, persistent 04/17/2015  . Chronic kidney disease (CKD), stage III (moderate) ([place]) 04/17/2015  . Decreased exercise tolerance 04/17/2015  . Diabetes mellitus with polyneuropathy ([place]) 04/17/2015  . Gastro-esophageal reflux disease without esophagitis 04/17/2015  . Bursitis, trochanteric 04/17/2015  . Cephalalgia 04/17/2015  . Benign hypertension 04/17/2015  . Adult hypothyroidism 04/17/2015  . Hearing loss 04/17/2015  . Chronic recurrent major depressive disorder ([place]) 04/17/2015  . Neurogenic claudication 04/17/2015  . Obesity (BMI 35.0-39.9 without comorbidity) 04/17/2015  . Hypo-ovarianism 04/17/2015  . Perennial allergic rhinitis with seasonal variation 04/17/2015  . Acne erythematosa 04/17/2015  . Dyskinesia, tardive 04/17/2015  . Memory loss 04/17/2015  . Impingement syndrome of shoulder 04/17/2015  . Dermatitis, stasis 04/17/2015  . Obstructive sleep apnea 05/14/2014  . Dyspnea 05/06/2014  . Hyperlipidemia 02/06/2012  . LBP (low back pain) 09/16/2008    Past Surgical History:  Procedure Laterality Date  . ANKLE SURGERY Left approx Jan 2018  . CATARACT EXTRACTION  01/2011   right  . eye lid surgery  2013   bilateral  . FOOT SURGERY    . NECK SURGERY    . TUBAL LIGATION    . VAGINAL HYSTERECTOMY  1989    Family History  Problem Relation Age of Onset  . Heart  attack Mother   . Aneurysm Mother     Social History   Socioeconomic History  . Marital status: Significant Other    Spouse name: Not on file  . Number of children: 1  . Years of education: Not on file  . Highest education level: High school graduate  Social Needs  . Financial resource strain: Not hard at all  . Food insecurity - worry: Never true  . Food insecurity -  inability: Never true  . Transportation needs - medical: No  . Transportation needs - non-medical: No  Occupational History  . Not on file  Tobacco Use  . Smoking status: Never Smoker  . Smokeless tobacco: Never Used  Substance and Sexual Activity  . Alcohol use: No    Alcohol/week: 0.0 - 0.6 oz    Frequency: Never  . Drug use: No  . Sexual activity: Yes    Partners: Male  Other Topics Concern  . Not on file  Social History Narrative  . Not on file     Current Outpatient Medications:  .  ACCU-CHEK SOFTCLIX LANCETS lancets, , Disp: , Rfl:  .  acetaminophen (TYLENOL 8 HOUR) 650 MG CR tablet, Take 1 tablet (650 mg total) by mouth every 8 (eight) hours as needed for pain., Disp: 90 tablet, Rfl: 0 .  ASPIRIN LOW DOSE 81 MG EC tablet, TAKE 1 TABLET (81 MG TOTAL) BY MOUTH DAILY., Disp: 30 tablet, Rfl: 0 .  Blood Glucose Monitoring Suppl (ACCU-CHEK AVIVA PLUS) w/Device KIT, , Disp: , Rfl:  .  diclofenac sodium (VOLTAREN) 1 % GEL, Apply topically as needed. Reported on 02/22/2016, Disp: , Rfl:  .  DULoxetine (CYMBALTA) 30 MG capsule, Take 1 capsule (30 mg total) by mouth daily., Disp: 30 capsule, Rfl: 1 .  Glucose Blood (BLOOD GLUCOSE TEST STRIPS) STRP, , Disp: , Rfl:  .  Insulin Glargine (LANTUS SOLOSTAR) 100 UNIT/ML Solostar Pen, Inject 50 Units into the skin daily at 10 pm. (Patient taking differently: Inject 68 Units daily at 10 pm into the skin. ), Disp: 15 mL, Rfl: 2 .  insulin regular (NOVOLIN R,HUMULIN R) 100 units/mL injection, Inject 0.1 mLs (10 Units total) into the skin 3 (three) times daily before meals. (Patient taking differently: Inject 40 Units into the skin 3 (three) times daily before meals. ), Disp: 10 mL, Rfl: 0 .  levothyroxine (SYNTHROID, LEVOTHROID) 25 MCG tablet, TAKE 1 TABLET EVERY DAY  EXCEPT TAKE 2 TABLETS  ON  SUNDAYS, Disp: 100 tablet, Rfl: 0 .  metFORMIN (GLUCOPHAGE-XR) 500 MG 24 hr tablet, Take 1 tablet (500 mg total) by mouth daily with breakfast., Disp: 180  tablet, Rfl: 1 .  metoprolol tartrate (LOPRESSOR) 25 MG tablet, TAKE 1 TABLET TWICE DAILY, Disp: 180 tablet, Rfl: 1 .  mometasone-formoterol (DULERA) 100-5 MCG/ACT AERO, Inhale 2 puffs into the lungs 2 (two) times daily., Disp: 3 Inhaler, Rfl: 1 .  montelukast (SINGULAIR) 10 MG tablet, TAKE 1 TABLET AT BEDTIME, Disp: 90 tablet, Rfl: 1 .  omeprazole (PRILOSEC) 40 MG capsule, TAKE 1 CAPSULE (40 MG TOTAL) BY MOUTH DAILY., Disp: 90 capsule, Rfl: 1 .  Potassium 99 MG TABS, Take 1 tablet by mouth daily., Disp: , Rfl:  .  pregabalin (LYRICA) 300 MG capsule, Take 2 capsules (600 mg total) by mouth daily. (Patient taking differently: Take 300 mg by mouth 2 (two) times daily. ), Disp: 180 capsule, Rfl: 1 .  RELION INSULIN SYRINGE 1ML/31G 31G X 5/16" 1 ML MISC, , Disp: , Rfl:  .  rosuvastatin (CRESTOR) 20 MG tablet, Take 20 mg by mouth daily., Disp: , Rfl:  .  vitamin C (ASCORBIC ACID) 500 MG tablet, Take 500 mg by mouth daily., Disp: , Rfl:  .  VITAMIN D, CHOLECALCIFEROL, PO, Take 2,000 Units by mouth daily. , Disp: , Rfl:  .  furosemide (LASIX) 20 MG tablet, Take 1 tablet (20 mg total) by mouth 2 (two) times daily., Disp: 90 tablet, Rfl: 0 .  irbesartan-hydrochlorothiazide (AVALIDE) 300-12.5 MG tablet, Take 1 tablet by mouth daily., Disp: 30 tablet, Rfl: 0  Allergies  Allergen Reactions  . Codeine Other (See Comments)    "TRIPPED OUT"  DIDN'T LIKE THE [place]  . Atorvastatin     muscle pain  . Latex Rash  . Zolpidem Other (See Comments)    Sleep walk     ROS  Constitutional: Negative for fever or weight change.  Respiratory: Positive  for cough and shortness of breath.   Cardiovascular: Negative for chest pain or palpitations.  Gastrointestinal: Negative for abdominal pain, no bowel changes.  Musculoskeletal: Positive  for gait problem and  joint swelling.  Skin: Positive for skin irritation, she pikes on her skin   Neurological: Negative for dizziness or headache.  No other specific  complaints in a complete review of systems (except as listed in HPI above).  Objective  Vitals:   11/02/17 1329  BP: (!) 158/80  Pulse: 91  Resp: 14  SpO2: 98%  Weight: 251 lb 4.8 oz (114 kg)  Height: _0  (1.575 m)    Body mass index is 45.96 kg/m.  Physical Exam  Constitutional: Patient appears well-developed and well-nourished. Obese No distress.  HEENT: head atraumatic, normocephalic, pupils equal and reactive to light, neck supple, throat within normal limits Cardiovascular: Normal rate, regular rhythm and normal heart sounds.  No murmur heard. Trace  BLE edema. Pulmonary/Chest: Effort normal and breath sounds normal. No respiratory distress. Abdominal: Soft.  There is no tenderness. Psychiatric: Patient has a normal mood and affect. behavior is normal. Judgment and thought content normal.  Recent Results (from the past 2160 hour(s))  COMPLETE METABOLIC PANEL WITH GFR     Status: Abnormal   Collection Time: 08/31/17  3:32 PM  Result Value Ref Range   Glucose, Bld 171 (H) 65 - 99 mg/dL    Comment: .            Fasting reference interval . For someone without known diabetes, a glucose value >125 mg/dL indicates that they may have diabetes and this should be confirmed with a follow-up test. .    BUN 29 (H) 7 - 25 mg/dL   Creat 1.17 (H) 0.50 - 0.99 mg/dL    Comment: For patients >96 years of age, the reference limit for Creatinine is approximately 13% higher for people identified as African-American. .    GFR, Est Non African American 48 (L) > OR = 60 mL/min/1.19m   GFR, Est African American 55 (L) > OR = 60 mL/min/1.739m  BUN/Creatinine Ratio 25 (H) 6 - 22 (calc)   Sodium 139 135 - 146 mmol/L   Potassium 4.5 3.5 - 5.3 mmol/L   Chloride 103 98 - 110 mmol/L   CO2 26 20 - 32 mmol/L   Calcium 10.1 8.6 - 10.4 mg/dL   Total Protein 6.3 6.1 - 8.1 g/dL   Albumin 4.3 3.6 - 5.1 g/dL   Globulin 2.0 1.9 - 3.7 g/dL (calc)   AG Ratio 2.2 1.0 - 2.5 (calc)  Total  Bilirubin 0.3 0.2 - 1.2 mg/dL   Alkaline phosphatase (APISO) 79 33 - 130 U/L   AST 19 10 - 35 U/L   ALT 28 6 - 29 U/L  CBC     Status: None   Collection Time: 08/31/17  3:32 PM  Result Value Ref Range   WBC 10.2 3.8 - 10.8 Thousand/uL   RBC 4.32 3.80 - 5.10 Million/uL   Hemoglobin 12.4 11.7 - 15.5 g/dL   HCT 36.5 35.0 - 45.0 %   MCV 84.5 80.0 - 100.0 fL   MCH 28.7 27.0 - 33.0 pg   MCHC 34.0 32.0 - 36.0 g/dL   RDW 14.9 11.0 - 15.0 %   Platelets 271 140 - 400 Thousand/uL   MPV 11.4 7.5 - 12.5 fL  COMPLETE METABOLIC PANEL WITH GFR     Status: Abnormal   Collection Time: 10/02/17 10:50 AM  Result Value Ref Range   Glucose, Bld 158 (H) 65 - 99 mg/dL    Comment: .            Fasting reference interval . For someone without known diabetes, a glucose value >125 mg/dL indicates that they may have diabetes and this should be confirmed with a follow-up test. .    BUN 22 7 - 25 mg/dL   Creat 0.96 0.50 - 0.99 mg/dL    Comment: For patients >50 years of age, the reference limit for Creatinine is approximately 13% higher for people identified as African-American. .    GFR, Est Non African American 61 > OR = 60 mL/min/1.79m   GFR, Est African American 70 > OR = 60 mL/min/1.780m  BUN/Creatinine Ratio NOT APPLICABLE 6 - 22 (calc)   Sodium 140 135 - 146 mmol/L   Potassium 4.4 3.5 - 5.3 mmol/L   Chloride 103 98 - 110 mmol/L   CO2 27 20 - 32 mmol/L   Calcium 10.0 8.6 - 10.4 mg/dL   Total Protein 6.4 6.1 - 8.1 g/dL   Albumin 4.3 3.6 - 5.1 g/dL   Globulin 2.1 1.9 - 3.7 g/dL (calc)   AG Ratio 2.0 1.0 - 2.5 (calc)   Total Bilirubin 0.4 0.2 - 1.2 mg/dL   Alkaline phosphatase (APISO) 87 33 - 130 U/L   AST 21 10 - 35 U/L   ALT 24 6 - 29 U/L  B Nat Peptide     Status: None   Collection Time: 10/02/17 10:50 AM  Result Value Ref Range   Brain Natriuretic Peptide 20 <100 pg/mL    Comment: . BNP levels increase with age in the general population with the highest values seen  in individuals greater than 7564ears of age. Reference: J. Am. [place] ArCardiol. 2002; ; 77:824-235. Marland Kitchen TSH     Status: None   Collection Time: 10/02/17 10:50 AM  Result Value Ref Range   TSH 2.01 0.40 - 4.50 mIU/L  POCT HgB A1C     Status: Abnormal   Collection Time: 11/02/17  1:53 PM  Result Value Ref Range   Hemoglobin A1C 8.8      PHQ2/9: [facility] 11/02/2017 11/02/2017 09/28/2017 09/21/2017 08/31/2017  Decreased Interest 3 0 3 3 0  Down, Depressed, Hopeless 3 0 1 1 0  PHQ - 2 Score 6 0 4 4 0  Altered sleeping 3 - 2 2 -  Tired, decreased energy 3 - 2 2 -  Change in appetite 3 - 3 3 -  Feeling bad or failure about  yourself  3 - 3 3 -  Trouble concentrating 3 - 2 2 -  Moving slowly or fidgety/restless 2 - 0 0 -  Suicidal thoughts 3 - 2 2 -  PHQ-9 Score 26 - 18 18 -  Difficult doing work/chores Very difficult - Very difficult Very difficult -  Some recent data might be hidden     Fall Risk: Fall Risk  11/02/2017 10/02/2017 09/28/2017 08/31/2017 08/21/2017  Falls in the past year? _0   Comment - - - - -  Number falls in past yr: - - - - -  Injury with Fall? - - - - -  Risk Factor Category  - - - - -  Comment - - - - -  Risk for fall due to : - - - - -  Risk for fall due to: Comment - - - - -  Follow up - - - - -    Functional Status Survey: Is the patient deaf or have difficulty hearing?: No Does the patient have difficulty seeing, even when wearing glasses/contacts?: No Does the patient have difficulty concentrating, remembering, or making decisions?: No Does the patient have difficulty walking or climbing stairs?: Yes Does the patient have difficulty dressing or bathing?: No Does the patient have difficulty doing errands alone such as visiting a doctor's office or shopping?: No    Assessment & Plan  1. Poorly controlled type 2 diabetes mellitus with neuropathy ([name])  - POCT HgB A1C 8.8 , she needs to stop eating cookies and follow up with  Dr. Manfred Shirts   2. (HFpEF) heart failure with preserved ejection fraction ([place])  She states lasix helped with fluid retention  - furosemide (LASIX) 20 MG tablet; Take 1 tablet (20 mg total) by mouth 2 (two) times daily.  Dispense: 90 tablet; Refill: 0 Tried to send to pulmonary/cardiac rehab but she has normal EF and does not qualify   3. Moderate persistent asthma without complication  She has a daily cough, seen by ENT and states cough improved with antifungal medication  4. Edema extremities  - furosemide (LASIX) 20 MG tablet; Take 1 tablet (20 mg total) by mouth 2 (two) times daily.  Dispense: 90 tablet; Refill: 0  5. MDD (major depressive disorder), recurrent episode, moderate ([place])  Seeing psychiatrist, but still very depression, " I don't care about my diabetes"  She will continue with therapy , switched to Duloxetine but has not started yet   6. Essential hypertension  We will adjust dose of Avalide to 300/12.5 mg daily   7. Cough in adult  - Ambulatory referral to ENT  8. Tinnitus, bilateral  - Ambulatory referral to ENT

## 2017-11-02 NOTE — Telephone Encounter (Signed)
Refill request for general medication: Omeprazole 40 mg  Last office visit: 11/02/2017  Last physical exam: None indicated  Follow-up on file. 12/05/2017

## 2017-11-05 ENCOUNTER — Other Ambulatory Visit: Payer: Self-pay

## 2017-11-05 NOTE — Patient Outreach (Signed)
[facility]) Care Management  11/05/2017  Summer [name] [birth year] [id number]   Initial telephone assessment Referral date:09/20/17 Referral source:[employer] direct referral Referral reason:Frequent emergency department visits Insurance:[employer] PCP: Dr. Ancil Boozer Podiatrist: Dr. Pricilla Holm Endocrinologist Dr. Alene Mires ENT - [facility])  Telephone call to patient regarding assessment follow up. HIPAA verified with patient. Patient reports seeing her primary MD on 11/02/17.  Patient states her doctor increased her blood pressure medication. Patient reports her blood pressures have been running 190's over 80's.  Patient states her doctor also wants her to continue to take the fluid pill, lasix. Patient states her doctor called it in to [facility] for mail order. Patient states mail order usually takes about 10 days. Patient states she is not currently on fluid pills and she only checks her weights every few days. RNCM advised patient to start checking her weights daily and record.  RNCM advised patient  to report to her doctor if she gains 3 lbs overnight or 5 lbs in a week. Also advised to notify her doctor if she experiences increase in swelling and any shortness of breath. Patient verbalized understanding. Patient reports her weight at 251 lbs Patient reports her blood sugar was 253 fasting this am. Patient states she made her primary MD aware of her blood sugars on her 11/02/17 appointment. Patient states she is scheduled to see the endocrinologist in March 2019.  RNCM discussed and offered [facility] community case management follow up. RNCM informed patient it would be beneficial for her to be monitored more closely by a nurse. Patient verbally agreed and agreed to community case management follow up.  Patient reports she saw her psychiatrist on 10/30/17.  States her psychiatrist put her on Cymbalta.  Patient reports she has a follow up appointment in March 2019 with her ENT.  RNCM advised  patient to take all of her medications as prescribed.  Patient reports she received the EMMI education material in the mail. RNCM discussed low sodium diet with patient.  RNCM advised patient to notify MD of any changes in condition prior to scheduled appointment. RNCM verified patient aware of 911 services for urgent/ emergent needs.  PLAN: RNCM will refer patient to community case manager   Quinn Plowman RN,BSN,CCM [facility] Telephonic  [phone]

## 2017-11-07 ENCOUNTER — Other Ambulatory Visit: Payer: Self-pay | Admitting: *Deleted

## 2017-11-07 ENCOUNTER — Other Ambulatory Visit: Payer: Self-pay | Admitting: Pharmacy Technician

## 2017-11-07 NOTE — Patient Outreach (Signed)
[facility]) Care Management  11/07/2017  Summer [name] [birth year] [id number]   Referral received 2/24 from Telephonic case management Referral reason : Community case management   Unsuccessful outreach call to patient, able to leave a HIPPA compliant message for return call.  Plan  Will await return call, if no response will schedule outreach call in the next business day.   Joylene Draft, RN, [place] Management Coordinator  [phone]- Mobile [phone]- Toll Free Main Office

## 2017-11-08 ENCOUNTER — Other Ambulatory Visit: Payer: Self-pay | Admitting: *Deleted

## 2017-11-08 ENCOUNTER — Encounter: Payer: Self-pay | Admitting: *Deleted

## 2017-11-08 NOTE — Patient Outreach (Signed)
[facility]) Care Management  11/08/2017  Summer [name] September 22, 1950 [id number]  Telephone Outreach attempt #2  Referral received 2/24 from Telephonic case management Referral reason : Community case management   Per chart review patient history includes Hypertension , Heart failure , Depression, Asthma.  Unsuccessful 2nd attempt to contact patient regarding community care management, able to leave a HIPAA compliant message requesting a return call     Plan Will plan to send unsuccessful  outreach letter and place on schedule return call within 10 business days.   Joylene Draft, RN, [place] Management Coordinator  [phone]- Mobile [phone]- Toll Free Main Office

## 2017-11-09 ENCOUNTER — Ambulatory Visit: Payer: Self-pay | Admitting: Pharmacy Technician

## 2017-11-12 ENCOUNTER — Other Ambulatory Visit: Payer: Self-pay | Admitting: Pharmacy Technician

## 2017-11-12 NOTE — Patient Outreach (Signed)
[place] [facility]) Care Management  11/12/2017  KYAN GIANNONE 1950/11/09 [id number]   Unsuccessful return call to patient   Maud Deed. [place], [place] Management [phone]

## 2017-11-12 NOTE — Patient Outreach (Addendum)
[facility]) Care Management  11/12/2017  Summer [name] 09-02-1951 [id number]   Unsuccessful outreach call #1 to patient in reference to [employer] patient assistance attestation letter.  Maud Deed [place], [place] Management [phone]

## 2017-11-16 ENCOUNTER — Other Ambulatory Visit: Payer: Self-pay | Admitting: Pharmacy Technician

## 2017-11-16 NOTE — Patient Outreach (Signed)
[facility]) Care Management  11/16/2017  Summer [name] November 11, 1950 [id number]   Successful outreach call to Ms. Hook in reference to [employer] patient assistance attestation letter. Patient states she has not received it as of yet.  Will follow up with patient next Wednesday to see if she has received.  Maud Deed [place], [place] Management [phone]

## 2017-11-19 ENCOUNTER — Encounter: Payer: Self-pay | Admitting: Family Medicine

## 2017-11-19 ENCOUNTER — Ambulatory Visit (INDEPENDENT_AMBULATORY_CARE_PROVIDER_SITE_OTHER): Payer: Medicare HMO | Admitting: Family Medicine

## 2017-11-19 VITALS — BP 124/68 | HR 99 | Temp 97.9°F | Resp 20 | Ht 62.0 in | Wt 246.6 lb

## 2017-11-19 DIAGNOSIS — R600 Localized edema: Secondary | ICD-10-CM

## 2017-11-19 DIAGNOSIS — I5030 Unspecified diastolic (congestive) heart failure: Secondary | ICD-10-CM

## 2017-11-19 DIAGNOSIS — E1165 Type 2 diabetes mellitus with hyperglycemia: Secondary | ICD-10-CM

## 2017-11-19 DIAGNOSIS — I10 Essential (primary) hypertension: Secondary | ICD-10-CM

## 2017-11-19 DIAGNOSIS — E1140 Type 2 diabetes mellitus with diabetic neuropathy, unspecified: Secondary | ICD-10-CM

## 2017-11-19 DIAGNOSIS — G4733 Obstructive sleep apnea (adult) (pediatric): Secondary | ICD-10-CM

## 2017-11-19 NOTE — Progress Notes (Signed)
Name: Summer [name]   MRN: [id number]    DOB: 1950/10/06   Date:11/19/2017       Progress Note  Subjective  Chief Complaint  Chief Complaint  Patient presents with  . Follow-up    2 week F/U  . Edema    Better controlled with Lasix twice daily has losted 5 pounds and BP has been around 140/70-120/60    HPI  Edema/SOB/weight gain/Chronic chf: she has a history of CKI, hypothyroidism, CHF with normal EF and sleep apnea. . She was seen about two months ago and had gained a lot of weight, was given lasix and she noticed an improvement, when she ran out of lasix the symptoms recurred, including worsening of SOB, even though normal BNAP. She has been doing well with lasix and potassium has been normal with supplementation. Discussed the risk of long term diuretics, she has been using medication daily  Depression: she hada very high phQ9, she is seeing psychiatrist again, given rx of Duloxetine and also seeing therapist and states symptoms are still unchanged but just started a few weeks ago. Denies suicidal thoughts or ideation, mood seems better in the office today  HTN: bp is normal today, she states at home bp is still elevated in the 140''s range. She is taking medication as prescribed, no side effects .  DMII: seeing Dr. Honor Junes, advised to no longer get HgbA1C with [place], hgbA1C is still above goal at 8.8%, she states not compliant with diet, she has been eating cookies. Advised her to try to walk a little more. Triglycerides also high, on ARB, statin therapy.  She told me today glucose this am at goal, but she did not snack during the night. She states that she wakes up in the middle of the night and eats sweets.   OSA: she states her CPAP machine is not working anymore, she needs new machine and supplies. She had loud snoring and interrupt sleep, wakes up feeling tired but denies morning headaches.   Obesity :she has lost 5 lbs since last visit, she is not compliant with a diet, and not  physically active. Discussed calorie count and the importance of following a diabetic diet also  Patient Active Problem List   Diagnosis Date Noted  . (HFpEF) heart failure with preserved ejection fraction ([place]) 08/31/2017  . Moderate persistent asthma 08/31/2017  . Hyperlipidemia due to type 2 diabetes mellitus ([place]) 06/14/2017  . Chest pain of unknown etiology 10/06/2016  . Essential hypertension 10/06/2016  . Morbid obesity with BMI of 40.0-44.9, adult ([place]) 10/06/2016  . Dyslipidemia associated with type 2 diabetes mellitus ([place]) 08/18/2016  . Charcot foot due to diabetes mellitus ([place]) 02/22/2016  . Hypertriglyceridemia 07/27/2015  . Acquired abduction deformity of foot 07/12/2015  . Osteoarthritis of subtalar joint 07/12/2015  . Poorly controlled type 2 diabetes mellitus with neuropathy ([place]) 07/12/2015  . Arthritis of foot, degenerative 07/12/2015  . Carpal tunnel syndrome 04/17/2015  . Chronic constipation 04/17/2015  . Insomnia, persistent 04/17/2015  . Chronic kidney disease (CKD), stage III (moderate) ([place]) 04/17/2015  . Decreased exercise tolerance 04/17/2015  . Diabetes mellitus with polyneuropathy ([place]) 04/17/2015  . Gastro-esophageal reflux disease without esophagitis 04/17/2015  . Bursitis, trochanteric 04/17/2015  . Cephalalgia 04/17/2015  . Benign hypertension 04/17/2015  . Adult hypothyroidism 04/17/2015  . Hearing loss 04/17/2015  . Chronic recurrent major depressive disorder ([place]) 04/17/2015  . Neurogenic claudication 04/17/2015  . Obesity (BMI 35.0-39.9 without comorbidity) 04/17/2015  . Hypo-ovarianism 04/17/2015  . Perennial  allergic rhinitis with seasonal variation 04/17/2015  . Acne erythematosa 04/17/2015  . Dyskinesia, tardive 04/17/2015  . Memory loss 04/17/2015  . Impingement syndrome of shoulder 04/17/2015  . Dermatitis, stasis 04/17/2015  . Obstructive sleep apnea 05/14/2014  . Dyspnea 05/06/2014  . Hyperlipidemia 02/06/2012  . LBP (low back  pain) 09/16/2008    Past Surgical History:  Procedure Laterality Date  . ANKLE SURGERY Left approx Jan 2018  . CATARACT EXTRACTION  01/2011   right  . eye lid surgery  2013   bilateral  . FOOT SURGERY    . NECK SURGERY    . TUBAL LIGATION    . VAGINAL HYSTERECTOMY  1989    Family History  Problem Relation Age of Onset  . Heart attack Mother   . Aneurysm Mother     Social History   Socioeconomic History  . Marital status: Significant Other    Spouse name: Not on file  . Number of children: 1  . Years of education: Not on file  . Highest education level: High school graduate  Social Needs  . Financial resource strain: Not hard at all  . Food insecurity - worry: Never true  . Food insecurity - inability: Never true  . Transportation needs - medical: No  . Transportation needs - non-medical: No  Occupational History  . Not on file  Tobacco Use  . Smoking status: Never Smoker  . Smokeless tobacco: Never Used  Substance and Sexual Activity  . Alcohol use: No    Alcohol/week: 0.0 - 0.6 oz    Frequency: Never  . Drug use: No  . Sexual activity: Yes    Partners: Male  Other Topics Concern  . Not on file  Social History Narrative  . Not on file     Current Outpatient Medications:  .  ACCU-CHEK SOFTCLIX LANCETS lancets, , Disp: , Rfl:  .  acetaminophen (TYLENOL 8 HOUR) 650 MG CR tablet, Take 1 tablet (650 mg total) by mouth every 8 (eight) hours as needed for pain., Disp: 90 tablet, Rfl: 0 .  ASPIRIN LOW DOSE 81 MG EC tablet, TAKE 1 TABLET (81 MG TOTAL) BY MOUTH DAILY., Disp: 30 tablet, Rfl: 0 .  Blood Glucose Monitoring Suppl (ACCU-CHEK AVIVA PLUS) w/Device KIT, , Disp: , Rfl:  .  diclofenac sodium (VOLTAREN) 1 % GEL, Apply topically as needed. Reported on 02/22/2016, Disp: , Rfl:  .  DULoxetine (CYMBALTA) 30 MG capsule, Take 1 capsule (30 mg total) by mouth daily., Disp: 30 capsule, Rfl: 1 .  furosemide (LASIX) 20 MG tablet, Take 1 tablet (20 mg total) by mouth 2  (two) times daily., Disp: 90 tablet, Rfl: 0 .  Glucose Blood (BLOOD GLUCOSE TEST STRIPS) STRP, , Disp: , Rfl:  .  Insulin Glargine (LANTUS SOLOSTAR) 100 UNIT/ML Solostar Pen, Inject 50 Units into the skin daily at 10 pm. (Patient taking differently: Inject 68 Units daily at 10 pm into the skin. ), Disp: 15 mL, Rfl: 2 .  insulin regular (NOVOLIN R,HUMULIN R) 100 units/mL injection, Inject 0.1 mLs (10 Units total) into the skin 3 (three) times daily before meals. (Patient taking differently: Inject 40 Units into the skin 3 (three) times daily before meals. ), Disp: 10 mL, Rfl: 0 .  irbesartan-hydrochlorothiazide (AVALIDE) 300-12.5 MG tablet, Take 1 tablet by mouth daily., Disp: 30 tablet, Rfl: 0 .  levothyroxine (SYNTHROID, LEVOTHROID) 25 MCG tablet, TAKE 1 TABLET EVERY DAY  EXCEPT TAKE 2 TABLETS  ON  SUNDAYS, Disp: 100 tablet,  Rfl: 0 .  metFORMIN (GLUCOPHAGE-XR) 500 MG 24 hr tablet, Take 1 tablet (500 mg total) by mouth daily with breakfast., Disp: 180 tablet, Rfl: 1 .  metoprolol tartrate (LOPRESSOR) 25 MG tablet, TAKE 1 TABLET TWICE DAILY, Disp: 180 tablet, Rfl: 1 .  mometasone-formoterol (DULERA) 100-5 MCG/ACT AERO, Inhale 2 puffs into the lungs 2 (two) times daily., Disp: 3 Inhaler, Rfl: 1 .  montelukast (SINGULAIR) 10 MG tablet, TAKE 1 TABLET AT BEDTIME, Disp: 90 tablet, Rfl: 1 .  omeprazole (PRILOSEC) 40 MG capsule, TAKE 1 CAPSULE EVERY DAY, Disp: 90 capsule, Rfl: 1 .  Potassium 99 MG TABS, Take 1 tablet by mouth daily., Disp: , Rfl:  .  pregabalin (LYRICA) 300 MG capsule, Take 2 capsules (600 mg total) by mouth daily. (Patient taking differently: Take 300 mg by mouth 2 (two) times daily. ), Disp: 180 capsule, Rfl: 1 .  RELION INSULIN SYRINGE 1ML/31G 31G X 5/16" 1 ML MISC, , Disp: , Rfl:  .  rosuvastatin (CRESTOR) 20 MG tablet, Take 20 mg by mouth daily., Disp: , Rfl:  .  vitamin C (ASCORBIC ACID) 500 MG tablet, Take 500 mg by mouth daily., Disp: , Rfl:  .  VITAMIN D, CHOLECALCIFEROL, PO, Take  2,000 Units by mouth daily. , Disp: , Rfl:   Allergies  Allergen Reactions  . Codeine Other (See Comments)    "TRIPPED OUT"  DIDN'T LIKE THE [place]  . Atorvastatin     muscle pain  . Latex Rash  . Zolpidem Other (See Comments)    Sleep walk     ROS  Constitutional: Negative for fever, positive for  weight change.  Respiratory: Negative for cough but has chronic  shortness of breath.   Cardiovascular: Negative for chest pain or palpitations.  Gastrointestinal: Negative for abdominal pain, no bowel changes.  Musculoskeletal: positive for gait problem or joint swelling.  Skin: Negative for rash.  Neurological: Negative for dizziness or headache.  No other specific complaints in a complete review of systems (except as listed in HPI above).  Objective  Vitals:   11/19/17 1436  BP: 124/68  Pulse: 99  Resp: 20  Temp: 97.9 F (36.6 C)  TempSrc: Oral  SpO2: 94%  Weight: 246 lb 9.6 oz (111.9 kg)  Height: '5\' 2"'$  (1.575 m)    Body mass index is 45.1 kg/m.  Physical Exam  Constitutional: Patient appears well-developed  Obese No distress.  HEENT: head atraumatic, normocephalic, pupils equal and reactive to light, neck supple, throat within normal limits Cardiovascular: Normal rate, regular rhythm and normal heart sounds.  No murmur heard. Trace  BLE edema. Pulmonary/Chest: Effort normal and breath sounds normal. No respiratory distress. Abdominal: Soft.  There is no tenderness. Psychiatric: Patient has a normal mood and affect. behavior is normal. Judgment and thought content normal.   Recent Results (from the past 2160 hour(s))  COMPLETE METABOLIC PANEL WITH GFR     Status: Abnormal   Collection Time: 08/31/17  3:32 PM  Result Value Ref Range   Glucose, Bld 171 (H) 65 - 99 mg/dL    Comment: .            Fasting reference interval . For someone without known diabetes, a glucose value >125 mg/dL indicates that they may have diabetes and this should be confirmed  with a follow-up test. .    BUN 29 (H) 7 - 25 mg/dL   Creat 1.17 (H) 0.50 - 0.99 mg/dL    Comment: For patients >49  years of age, the reference limit for Creatinine is approximately 13% higher for people identified as African-American. .    GFR, Est Non African American 48 (L) > OR = 60 mL/min/1.56m   GFR, Est African American 55 (L) > OR = 60 mL/min/1.749m  BUN/Creatinine Ratio 25 (H) 6 - 22 (calc)   Sodium 139 135 - 146 mmol/L   Potassium 4.5 3.5 - 5.3 mmol/L   Chloride 103 98 - 110 mmol/L   CO2 26 20 - 32 mmol/L   Calcium 10.1 8.6 - 10.4 mg/dL   Total Protein 6.3 6.1 - 8.1 g/dL   Albumin 4.3 3.6 - 5.1 g/dL   Globulin 2.0 1.9 - 3.7 g/dL (calc)   AG Ratio 2.2 1.0 - 2.5 (calc)   Total Bilirubin 0.3 0.2 - 1.2 mg/dL   Alkaline phosphatase (APISO) 79 33 - 130 U/L   AST 19 10 - 35 U/L   ALT 28 6 - 29 U/L  CBC     Status: None   Collection Time: 08/31/17  3:32 PM  Result Value Ref Range   WBC 10.2 3.8 - 10.8 Thousand/uL   RBC 4.32 3.80 - 5.10 Million/uL   Hemoglobin 12.4 11.7 - 15.5 g/dL   HCT 36.5 35.0 - 45.0 %   MCV 84.5 80.0 - 100.0 fL   MCH 28.7 27.0 - 33.0 pg   MCHC 34.0 32.0 - 36.0 g/dL   RDW 14.9 11.0 - 15.0 %   Platelets 271 140 - 400 Thousand/uL   MPV 11.4 7.5 - 12.5 fL  COMPLETE METABOLIC PANEL WITH GFR     Status: Abnormal   Collection Time: 10/02/17 10:50 AM  Result Value Ref Range   Glucose, Bld 158 (H) 65 - 99 mg/dL    Comment: .            Fasting reference interval . For someone without known diabetes, a glucose value >125 mg/dL indicates that they may have diabetes and this should be confirmed with a follow-up test. .    BUN 22 7 - 25 mg/dL   Creat 0.96 0.50 - 0.99 mg/dL    Comment: For patients >4932ears of age, the reference limit for Creatinine is approximately 13% higher for people identified as African-American. .    GFR, Est Non African American 61 > OR = 60 mL/min/1.7322m GFR, Est African American 70 > OR = 60 mL/min/1.62m6m BUN/Creatinine Ratio NOT APPLICABLE 6 - 22 (calc)   Sodium 140 135 - 146 mmol/L   Potassium 4.4 3.5 - 5.3 mmol/L   Chloride 103 98 - 110 mmol/L   CO2 27 20 - 32 mmol/L   Calcium 10.0 8.6 - 10.4 mg/dL   Total Protein 6.4 6.1 - 8.1 g/dL   Albumin 4.3 3.6 - 5.1 g/dL   Globulin 2.1 1.9 - 3.7 g/dL (calc)   AG Ratio 2.0 1.0 - 2.5 (calc)   Total Bilirubin 0.4 0.2 - 1.2 mg/dL   Alkaline phosphatase (APISO) 87 33 - 130 U/L   AST 21 10 - 35 U/L   ALT 24 6 - 29 U/L  B Nat Peptide     Status: None   Collection Time: 10/02/17 10:50 AM  Result Value Ref Range   Brain Natriuretic Peptide 20 <100 pg/mL    Comment: . BNP levels increase with age in the general population with the highest values seen in individuals greater than 75 y69rs of age. Reference: J. Am. Coll[place] Arrdiol. 2002; 40; 06:237-628Marland Kitchen  TSH     Status: None   Collection Time: 10/02/17 10:50 AM  Result Value Ref Range   TSH 2.01 0.40 - 4.50 mIU/L  POCT HgB A1C     Status: Abnormal   Collection Time: 11/02/17  1:53 PM  Result Value Ref Range   Hemoglobin A1C 8.8      PHQ2/9: Depression screen [facility] 2/9 11/02/2017 11/02/2017 09/28/2017 09/21/2017 08/31/2017  Decreased Interest 3 0 3 3 0  Down, Depressed, Hopeless 3 0 1 1 0  PHQ - 2 Score 6 0 4 4 0  Altered sleeping 3 - 2 2 -  Tired, decreased energy 3 - 2 2 -  Change in appetite 3 - 3 3 -  Feeling bad or failure about yourself  3 - 3 3 -  Trouble concentrating 3 - 2 2 -  Moving slowly or fidgety/restless 2 - 0 0 -  Suicidal thoughts 3 - 2 2 -  PHQ-9 Score 26 - 18 18 -  Difficult doing work/chores Very difficult - Very difficult Very difficult -  Some recent data might be hidden     Fall Risk: Fall Risk  11/02/2017 10/02/2017 09/28/2017 08/31/2017 08/21/2017  Falls in the past year? No No No No No  Comment - - - - -  Number falls in past yr: - - - - -  Injury with Fall? - - - - -  Risk Factor Category  - - - - -  Comment - - - - -  Risk for fall due to : - - - - -  Risk  for fall due to: Comment - - - - -  Follow up - - - - -      Assessment & Plan   1. (HFpEF) heart failure with preserved ejection fraction ([name])  Continue lasix  2. Essential hypertension  At goal today  3. Edema extremities  stable  4. Poorly controlled type 2 diabetes mellitus with neuropathy ([place])  Discussed life style modification again   5. Obstructive sleep apnea  We will order CPAP supplies   6. Morbid (severe) obesity due to excess calories [facility])  Discussed with the patient the risk posed by an increased BMI. Discussed importance of portion control, calorie counting and at least 150 minutes of physical activity weekly. Avoid sweet beverages and drink more water. Eat at least 6 servings of fruit and vegetables daily

## 2017-11-19 NOTE — Patient Instructions (Signed)

## 2017-11-21 ENCOUNTER — Other Ambulatory Visit: Payer: Self-pay | Admitting: Pharmacy Technician

## 2017-11-21 ENCOUNTER — Other Ambulatory Visit: Payer: Self-pay | Admitting: *Deleted

## 2017-11-21 DIAGNOSIS — J0181 Other acute recurrent sinusitis: Secondary | ICD-10-CM | POA: Diagnosis not present

## 2017-11-21 NOTE — Patient Outreach (Signed)
[place] [facility]) Care Management  11/21/2017  Summer [name] March 27, 1951 [id number]   Telephone Assessment Call   Referral received 2/24 from Telephonic case management Prior referral source [employer] direct,  Referral reason : Community case management   Per chart review patient history includes but not limited to   Hypertension , Diabetes, Heart failure , Depression, Asthma.  [place] call to patient contact number provided, no answer able to leave a HIPAA compliant message with return call number. This is 3rd telephone contact attempt .   Will plan return call later today.   1530 Return call to patient , HIPAA verified, explained the reason for the call and [facility] care management community services, patient voiced understanding from previous referral from telephonic care manager.   Patient discussed her medical conditions. Diabetes Discussed blood sugar this am was 317, a little higher than yesterday which was 195. Patient discussed recent Alc 8.8, states sometimes she just gets off track falling back to eating sweets, ice cream.  Depression  Patient reports she see a therapist and psychiatrist , next appointment on 3/19. Patient discussed how beneficial sessions are.  Heart Failure/Edema  Patient reports continuing to weigh daily, reports losing 5 pounds in the last 2 weeks since starting back taking lasix. Denies shortness of breath at this time, still has edema lower legs. Discussed notifying MD of sudden weight gain , of 3 pounds in a day and 5 in a week along with increased shortness of breath and swelling.  Patient is agreeable to scheduling home visit, unable to complete assessment and discussion on goals at this time , but discussed wanting to work toward managing her weight .   Plan RNCM will plan home visit in the next week to continue assessment and work with patient on care plan goals.  RNCM provided contact information , verified she has [facility] 24 hour nurse  line and [facility] main line numbers.    Joylene Draft, RN, [place] Management Coordinator  [phone]- Mobile [phone]- Toll Free Main Office

## 2017-11-21 NOTE — Patient Outreach (Signed)
[place] [facility]) Care Management  11/21/2017  TAMETRA AHART 10-21-1950 [id number]  Successful outreach call to patient in reference to [employer]. HIPAA identifiers verified. Ms. Ishii states she has not received the attestation letter from [employer] as of yet. Requested patient contact me if she receives before Monday.  Will follow up with patient Monday the 18th to see if attestation letter was received.  Maud Deed [place], [place] Management [phone]

## 2017-11-23 ENCOUNTER — Encounter: Payer: Self-pay | Admitting: Family Medicine

## 2017-11-26 ENCOUNTER — Other Ambulatory Visit: Payer: Self-pay | Admitting: Pharmacy Technician

## 2017-11-26 ENCOUNTER — Encounter: Payer: Self-pay | Admitting: Family Medicine

## 2017-11-26 ENCOUNTER — Telehealth: Payer: Self-pay

## 2017-11-26 ENCOUNTER — Other Ambulatory Visit: Payer: Self-pay | Admitting: Family Medicine

## 2017-11-26 NOTE — Patient Outreach (Signed)
[place] [facility]) Care Management  11/26/2017  Summer [name] 02-25-1951 [id number]  Incoming patient call from Ms. Natzke. HIPAA identifiers verified. Patient states she still has not received attestation letter from [employer] patient assistance.  I will contact [employer] patient assistance and follow up with patient on Wednesday.  Maud Deed [place], [place] Management [phone]

## 2017-11-26 NOTE — Telephone Encounter (Signed)
[facility] faxed [place] they received our order for replacement of CPAP machine and supplies. However, her CPAP was given in 2015 and insurance will not cover new equipment until 2020. All insurance require that the equipment must be at least 69 years old and if not, the equipment must be sent to manufacturer for repair. Since [facility] did not provide her current equipment patient would need to contact the company and manufacture who original supplied machine. Patient notified and states [employer] gave her original supplies but did not like dealing with them due to their attitude and the way they made her feel when instructing her to fix it at home. Patient informed me to forget about it and hung up.

## 2017-11-26 NOTE — Patient Outreach (Signed)
[facility] [facility]) Care Management  11/26/2017  KADESHA VIRRUETA 05/09/1951 [id number]   Unsuccessful outreach call #1 in reference to [employer] patient assistance attestation letter.  Will call patient back on Wednesday.  Maud Deed [place], [place] Management [phone]

## 2017-11-26 NOTE — Telephone Encounter (Signed)
Copied from [place]. Topic: General - Call Back - No Documentation >> Nov 26, 2017  4:00 PM Arletha Grippe wrote: Reason for CRM: pt is returning a call - please call back - no crm

## 2017-11-27 ENCOUNTER — Ambulatory Visit (INDEPENDENT_AMBULATORY_CARE_PROVIDER_SITE_OTHER): Payer: Medicare HMO | Admitting: Licensed Clinical Social Worker

## 2017-11-27 ENCOUNTER — Ambulatory Visit: Payer: Medicare HMO | Admitting: Psychiatry

## 2017-11-27 ENCOUNTER — Other Ambulatory Visit: Payer: Self-pay

## 2017-11-27 ENCOUNTER — Encounter: Payer: Self-pay | Admitting: Psychiatry

## 2017-11-27 VITALS — BP 113/67 | HR 102 | Temp 98.0°F | Wt 251.2 lb

## 2017-11-27 DIAGNOSIS — F331 Major depressive disorder, recurrent, moderate: Secondary | ICD-10-CM

## 2017-11-27 DIAGNOSIS — G4733 Obstructive sleep apnea (adult) (pediatric): Secondary | ICD-10-CM

## 2017-11-27 DIAGNOSIS — F419 Anxiety disorder, unspecified: Secondary | ICD-10-CM

## 2017-11-27 DIAGNOSIS — Z9989 Dependence on other enabling machines and devices: Secondary | ICD-10-CM

## 2017-11-27 MED ORDER — DOXEPIN HCL 10 MG PO CAPS
10.0000 mg | ORAL_CAPSULE | Freq: Every day | ORAL | 1 refills | Status: DC
Start: 2017-11-27 — End: 2017-12-24

## 2017-11-27 MED ORDER — DULOXETINE HCL 30 MG PO CPEP: 60.0000 mg | ORAL_CAPSULE | Freq: Every day | ORAL | 1 refills | Status: DC
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
## 2017-11-27 NOTE — Patient Instructions (Signed)
Duloxetine delayed-release capsules What is this medicine? DULOXETINE (doo LOX e teen) is used to treat depression, anxiety, and different types of chronic pain. This medicine may be used for other purposes; ask your health care provider or pharmacist if you have questions. COMMON BRAND NAME(S): Cymbalta, Irenka What should I tell my health care provider before I take this medicine? They need to know if you have any of these conditions: -bipolar disorder or a family history of bipolar disorder -glaucoma -kidney disease -liver disease -suicidal thoughts or a previous suicide attempt -taken medicines called MAOIs like Carbex, Eldepryl, Marplan, Nardil, and Parnate within 14 days -an unusual reaction to duloxetine, other medicines, foods, dyes, or preservatives -pregnant or trying to get pregnant -breast-feeding How should I use this medicine? Take this medicine by mouth with a glass of water. Follow the directions on the prescription label. Do not cut, crush or chew this medicine. You can take this medicine with or without food. Take your medicine at regular intervals. Do not take your medicine more often than directed. Do not stop taking this medicine suddenly except upon the advice of your doctor. Stopping this medicine too quickly may cause serious side effects or your condition may worsen. A special MedGuide will be given to you by the pharmacist with each prescription and refill. Be sure to read this information carefully each time. Talk to your pediatrician regarding the use of this medicine in children. While this drug may be prescribed for children as young as 7 years of age for selected conditions, precautions do apply. Overdosage: If you think you have taken too much of this medicine contact a poison control center or emergency room at once. NOTE: This medicine is only for you. Do not share this medicine with others. What if I miss a dose? If you miss a dose, take it as soon as you  can. If it is almost time for your next dose, take only that dose. Do not take double or extra doses. What may interact with this medicine? Do not take this medicine with any of the following medications: -desvenlafaxine -levomilnacipran -linezolid -MAOIs like Carbex, Eldepryl, Marplan, Nardil, and Parnate -methylene blue (injected into a vein) -milnacipran -thioridazine -venlafaxine This medicine may also interact with the following medications: -alcohol -amphetamines -aspirin and aspirin-like medicines -certain antibiotics like ciprofloxacin and enoxacin -certain medicines for blood pressure, heart disease, irregular heart beat -certain medicines for depression, anxiety, or psychotic disturbances -certain medicines for migraine headache like almotriptan, eletriptan, frovatriptan, naratriptan, rizatriptan, sumatriptan, zolmitriptan -certain medicines that treat or prevent blood clots like warfarin, enoxaparin, and dalteparin -cimetidine -fentanyl -lithium -NSAIDS, medicines for pain and inflammation, like ibuprofen or naproxen -phentermine -procarbazine -rasagiline -sibutramine -St. John's wort -theophylline -tramadol -tryptophan This list may not describe all possible interactions. Give your health care provider a list of all the medicines, herbs, non-prescription drugs, or dietary supplements you use. Also tell them if you smoke, drink alcohol, or use illegal drugs. Some items may interact with your medicine. What should I watch for while using this medicine? Tell your doctor if your symptoms do not get better or if they get worse. Visit your doctor or health care professional for regular checks on your progress. Because it may take several weeks to see the full effects of this medicine, it is important to continue your treatment as prescribed by your doctor. Patients and their families should watch out for new or worsening thoughts of suicide or depression. Also watch out for  sudden changes in   feelings such as feeling anxious, agitated, panicky, irritable, hostile, aggressive, impulsive, severely restless, overly excited and hyperactive, or not being able to sleep. If this happens, especially at the beginning of treatment or after a change in dose, call your health care professional. Summer [name] may get drowsy or dizzy. Do not drive, use machinery, or do anything that needs mental alertness until you know how this medicine affects you. Do not stand or sit up quickly, especially if you are an older patient. This reduces the risk of dizzy or fainting spells. Alcohol may interfere with the effect of this medicine. Avoid alcoholic drinks. This medicine can cause an increase in blood pressure. This medicine can also cause a sudden drop in your blood pressure, which may make you feel faint and increase the chance of a fall. These effects are most common when you first start the medicine or when the dose is increased, or during use of other medicines that can cause a sudden drop in blood pressure. Check with your doctor for instructions on monitoring your blood pressure while taking this medicine. Your mouth may get dry. Chewing sugarless gum or sucking hard candy, and drinking plenty of water may help. Contact your doctor if the problem does not go away or is severe. What side effects may I notice from receiving this medicine? Side effects that you should report to your doctor or health care professional as soon as possible: -allergic reactions like skin rash, itching or hives, swelling of the face, lips, or tongue -anxious -breathing problems -confusion -changes in vision -chest pain -confusion -elevated mood, decreased need for sleep, racing thoughts, impulsive behavior -eye pain -fast, irregular heartbeat -feeling faint or lightheaded, falls -feeling agitated, angry, or irritable -hallucination, loss of contact with reality -high blood pressure -loss of balance or  coordination -palpitations -redness, blistering, peeling or loosening of the skin, including inside the mouth -restlessness, pacing, inability to keep still -seizures -stiff muscles -suicidal thoughts or other mood changes -trouble passing urine or change in the amount of urine -trouble sleeping -unusual bleeding or bruising -unusually weak or tired -vomiting -yellowing of the eyes or skin Side effects that usually do not require medical attention (report to your doctor or health care professional if they continue or are bothersome): -change in sex drive or performance -change in appetite or weight -constipation -dizziness -dry mouth -headache -increased sweating -nausea -tired This list may not describe all possible side effects. Call your doctor for medical advice about side effects. You may report side effects to FDA at 1-800-FDA-1088. Where should I keep my medicine? Keep out of the reach of children. Store at room temperature between 20 and 25 degrees C (68 to 77 degrees F). Throw away any unused medicine after the expiration date. NOTE: This sheet is a summary. It may not cover all possible information. If you have questions about this medicine, talk to your doctor, pharmacist, or health care provider.  2018 [employer] (2016-01-27 18:16:03) Doxepin capsules What is this medicine? DOXEPIN (DOX e pin) is used to treat depression and anxiety. This medicine may be used for other purposes; ask your health care provider or pharmacist if you have questions. COMMON BRAND NAME(S): Sinequan What should I tell my health care provider before I take this medicine? They need to know if you have any of these conditions: -bipolar disorder -difficulty passing urine -glaucoma -heart disease -if you frequently drink alcohol containing drinks -liver disease -lung or breathing disease, like asthma or sleep apnea -prostate trouble -schizophrenia -seizures -  suicidal  thoughts, plans, or attempt; a previous suicide attempt by you or a family member -an unusual or allergic reaction to doxepin, other medicines, foods, dyes, or preservatives -pregnant or trying to get pregnant -breast-feeding How should I use this medicine? Take this medicine by mouth with a glass of water. Follow the directions on the prescription label. Take your doses at regular intervals. Do not take your medicine more often than directed. Do not stop taking this medicine suddenly except upon the advice of your doctor. Stopping this medicine too quickly may cause serious side effects or your condition may worsen. A special MedGuide will be given to you by the pharmacist with each prescription and refill. Be sure to read this information carefully each time. Talk to your pediatrician regarding the use of this medicine in children. While this drug may be prescribed for children as young as 12 years for selected conditions, precautions do apply. Overdosage: If you think you have taken too much of this medicine contact a poison control center or emergency room at once. NOTE: This medicine is only for you. Do not share this medicine with others. What if I miss a dose? If you miss a dose, take it as soon as you can. If it is almost time for your next dose, take only that dose. Do not take double or extra doses. What may interact with this medicine? Do not take this medicine with any of the following medications: -arsenic trioxide -certain medicines used to regulate abnormal heartbeat or to treat other heart conditions -cisapride -halofantrine -levomethadyl -linezolid -MAOIs like Carbex, Eldepryl, Marplan, Nardil, and Parnate -methylene blue -other medicines for mental depression -phenothiazines like perphenazine, thioridazine and chlorpromazine -pimozide -procarbazine -sparfloxacin -St. John's Wort -ziprasidone This medicine may also interact with the following  medications: -cimetidine -tolazamide This list may not describe all possible interactions. Give your health care provider a list of all the medicines, herbs, non-prescription drugs, or dietary supplements you use. Also tell them if you smoke, drink alcohol, or use illegal drugs. Some items may interact with your medicine. What should I watch for while using this medicine? Visit your doctor or health care professional for regular checks on your progress. It can take several days before you feel the full effect of this medicine. If you have been taking this medicine regularly for some time, do not suddenly stop taking it. You must gradually reduce the dose or you may get severe side effects. Ask your doctor or health care professional for advice. Even after you stop taking this medicine it can still affect your body for several days. Patients and their families should watch out for new or worsening thoughts of suicide or depression. Also watch out for sudden changes in feelings such as feeling anxious, agitated, panicky, irritable, hostile, aggressive, impulsive, severely restless, overly excited and hyperactive, or not being able to sleep. If this happens, especially at the beginning of treatment or after a change in dose, call your health care professional. Summer [name] may get drowsy or dizzy. Do not drive, use machinery, or do anything that needs mental alertness until you know how this medicine affects you. Do not stand or sit up quickly, especially if you are an older patient. This reduces the risk of dizzy or fainting spells. Alcohol may increase dizziness and drowsiness. Avoid alcoholic drinks. Do not treat yourself for coughs, colds, or allergies without asking your doctor or health care professional for advice. Some ingredients can increase possible side effects. Your mouth may get  dry. Chewing sugarless gum or sucking hard candy, and drinking plenty of water may help. Contact your doctor if the problem does  not go away or is severe. This medicine may cause dry eyes and blurred vision. If you wear contact lenses you may feel some discomfort. Lubricating drops may help. See your eye doctor if the problem does not go away or is severe. This medicine can make you more sensitive to the sun. Keep out of the sun. If you cannot avoid being in the sun, wear protective clothing and use sunscreen. Do not use sun lamps or tanning beds/booths. What side effects may I notice from receiving this medicine? Side effects that you should report to your doctor or health care professional as soon as possible: -allergic reactions like skin rash, itching or hives, swelling of the face, lips, or tongue -anxious -breathing problems -changes in vision -confusion -elevated mood, decreased need for sleep, racing thoughts, impulsive behavior -eye pain -fast, irregular heartbeat -feeling faint or lightheaded, falls -feeling agitated, angry, or irritable -fever with increased sweating -hallucination, loss of contact with reality -seizures -stiff muscles -suicidal thoughts or other mood changes -tingling, pain, or numbness in the feet or hands -trouble passing urine or change in the amount of urine -trouble sleeping -unusually weak or tired -vomiting -yellowing of the eyes or skin Side effects that usually do not require medical attention (report to your doctor or health care professional if they continue or are bothersome): -change in sex drive or performance -change in appetite or weight -constipation -dizziness -dry mouth -nausea -tired -tremors -upset stomach This list may not describe all possible side effects. Call your doctor for medical advice about side effects. You may report side effects to FDA at 1-800-FDA-1088. Where should I keep my medicine? Keep out of the reach of children. Store at room temperature between 15 and 30 degrees C (59 and 86 degrees F). Throw away any unused medicine after the  expiration date. NOTE: This sheet is a summary. It may not cover all possible information. If you have questions about this medicine, talk to your doctor, pharmacist, or health care provider.  2018 [employer] (2016-01-28 12:35:05)
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
## 2017-11-28 ENCOUNTER — Encounter: Payer: Self-pay | Admitting: Pharmacy Technician

## 2017-11-28 ENCOUNTER — Other Ambulatory Visit: Payer: Self-pay

## 2017-11-28 ENCOUNTER — Encounter: Payer: Self-pay | Admitting: Psychiatry

## 2017-11-28 ENCOUNTER — Other Ambulatory Visit: Payer: Self-pay | Admitting: Pharmacy Technician

## 2017-11-28 DIAGNOSIS — R600 Localized edema: Secondary | ICD-10-CM

## 2017-11-28 DIAGNOSIS — F331 Major depressive disorder, recurrent, moderate: Secondary | ICD-10-CM

## 2017-11-28 DIAGNOSIS — E1165 Type 2 diabetes mellitus with hyperglycemia: Secondary | ICD-10-CM

## 2017-11-28 DIAGNOSIS — E1142 Type 2 diabetes mellitus with diabetic polyneuropathy: Secondary | ICD-10-CM

## 2017-11-28 DIAGNOSIS — I10 Essential (primary) hypertension: Secondary | ICD-10-CM

## 2017-11-28 DIAGNOSIS — I5030 Unspecified diastolic (congestive) heart failure: Secondary | ICD-10-CM

## 2017-11-28 DIAGNOSIS — K219 Gastro-esophageal reflux disease without esophagitis: Secondary | ICD-10-CM

## 2017-11-28 DIAGNOSIS — E1140 Type 2 diabetes mellitus with diabetic neuropathy, unspecified: Secondary | ICD-10-CM

## 2017-11-28 MED ORDER — METOPROLOL TARTRATE 25 MG PO TABS
25.0000 mg | ORAL_TABLET | Freq: Two times a day (BID) | ORAL | 1 refills | Status: DC
Start: 2017-11-28 — End: 2018-05-01

## 2017-11-28 MED ORDER — INSULIN REGULAR HUMAN 100 UNIT/ML IJ SOLN: 10.0000 [IU] | Freq: Three times a day (TID) | INTRAMUSCULAR | 0 refills | Status: DC

## 2017-11-28 MED ORDER — LEVOTHYROXINE SODIUM 25 MCG PO TABS: ORAL_TABLET | ORAL | 1 refills | Status: DC

## 2017-11-28 MED ORDER — INSULIN GLARGINE 100 UNIT/ML SOLOSTAR PEN: 50.0000 [IU] | PEN_INJECTOR | Freq: Every day | SUBCUTANEOUS | 2 refills | Status: DC

## 2017-11-28 MED ORDER — METFORMIN HCL ER 500 MG PO TB24: 500.0000 mg | ORAL_TABLET | Freq: Every day | ORAL | 1 refills | Status: DC

## 2017-11-28 MED ORDER — DICLOFENAC SODIUM 1 % TD GEL: TRANSDERMAL | 3 refills | Status: DC

## 2017-11-28 MED ORDER — IRBESARTAN-HYDROCHLOROTHIAZIDE 300-12.5 MG PO TABS: 1.0000 | ORAL_TABLET | Freq: Every day | ORAL | 1 refills | Status: DC

## 2017-11-28 MED ORDER — PREGABALIN 300 MG PO CAPS: 600.0000 mg | ORAL_CAPSULE | Freq: Every day | ORAL | 1 refills | Status: DC

## 2017-11-28 MED ORDER — MONTELUKAST SODIUM 10 MG PO TABS: 10.0000 mg | ORAL_TABLET | Freq: Every day | ORAL | 1 refills | Status: DC

## 2017-11-28 MED ORDER — ROSUVASTATIN CALCIUM 20 MG PO TABS: 20.0000 mg | ORAL_TABLET | Freq: Every day | ORAL | 1 refills | Status: DC

## 2017-11-28 MED ORDER — OMEPRAZOLE 40 MG PO CPDR: 40.0000 mg | DELAYED_RELEASE_CAPSULE | Freq: Every day | ORAL | 1 refills | Status: DC

## 2017-11-28 MED ORDER — MOMETASONE FURO-FORMOTEROL FUM 100-5 MCG/ACT IN AERO: 2.0000 | INHALATION_SPRAY | Freq: Two times a day (BID) | RESPIRATORY_TRACT | 1 refills | Status: DC

## 2017-11-28 NOTE — Telephone Encounter (Signed)
[facility] is in the process of setting up patient's account. They are requesting new prescriptions. Please take a look to ensure that these prescriptions are exactly what the patient is taking and they are for 90-day supply.

## 2017-11-28 NOTE — Patient Outreach (Signed)
[facility]) Care Management  11/28/2017  Summer [name] 1950/10/18 [id number]   Successful outreach call to [name] in reference to [employer] letter, HIPAA identifiers verified. Patient states that she still has not received the letter in the mail.  Spoke to [employer] on Monday that stated that patient should have received it by now and if she hadn't we would have to start the application again. The process has to be started again due to the fact that [employer] requires original documentation. [employer] Interior and spatial designer with attestation letter so since the patient has not received letter, there is no longer original document!  Informed patient of the issue and offered to start the process again and she obliged. Will route note to Pharmacist Ralene Bathe to update her with this info.  Maud Deed [place], [place] Management [phone]

## 2017-11-29 ENCOUNTER — Telehealth: Payer: Self-pay | Admitting: Family Medicine

## 2017-11-29 DIAGNOSIS — M25572 Pain in left ankle and joints of left foot: Principal | ICD-10-CM

## 2017-11-29 DIAGNOSIS — M25571 Pain in right ankle and joints of right foot: Principal | ICD-10-CM

## 2017-11-29 DIAGNOSIS — E1142 Type 2 diabetes mellitus with diabetic polyneuropathy: Secondary | ICD-10-CM

## 2017-11-29 DIAGNOSIS — G8929 Other chronic pain: Secondary | ICD-10-CM

## 2017-11-29 NOTE — Telephone Encounter (Signed)
Copied from [place] [id number]. Topic: Quick Communication - See Telephone Encounter >> Nov 29, 2017  8:23 AM Aurelio Brash B wrote: CRM for notification. See Telephone encounter for: 11/29/17.  Conner from [employer] is calling for clarification on pts rx for metFORMIN   and diclofenic  Contact number [phone]

## 2017-11-30 ENCOUNTER — Encounter: Payer: Self-pay | Admitting: *Deleted

## 2017-11-30 ENCOUNTER — Other Ambulatory Visit: Payer: Self-pay | Admitting: *Deleted

## 2017-11-30 MED ORDER — METFORMIN HCL ER 500 MG PO TB24: 500.0000 mg | ORAL_TABLET | Freq: Every day | ORAL | 1 refills | Status: DC

## 2017-11-30 MED ORDER — DICLOFENAC SODIUM 1 % TD GEL: TRANSDERMAL | 2 refills | Status: DC

## 2017-11-30 NOTE — Patient Outreach (Addendum)
[facility]) Care Management   11/30/2017  Summer [name] 11/16/1950 [id number]  Summer [name] is an 67 y.o. female   Referral received 2/24 from Telephonic case management Prior referral source [employer] direct,  Referral reason : Community case management  Per chart review patient history includes but not limited to   Hypertension , Diabetes, HFpEF , Depression, Asthma, CKD   Subjective:  Patient discussed being tired on some days she just doesn't get out of the house or her room.   Objective:  BP 136/80 (BP Location: Right Arm, Patient Position: Sitting, Cuff Size: Large)   Pulse 78   Resp 20   Ht 1.575 m (5' 2")   Wt 247 lb (112 kg)   SpO2 98%   BMI 45.18 kg/m  Using rollator while walking during visit.  Review of Systems  Constitutional: Negative.   HENT: Negative.   Eyes: Negative.   Respiratory: Negative.   Cardiovascular: Positive for leg swelling.       Left greater than right   Gastrointestinal: Negative.   Genitourinary: Negative.   Musculoskeletal: Positive for joint pain.  Skin: Negative.   Neurological: Negative.   Endo/Heme/Allergies: Negative.   Psychiatric/Behavioral: Positive for depression.    Physical Exam  Constitutional: She is oriented to person, place, and time. She appears well-developed and well-nourished.  Cardiovascular: Normal rate and normal heart sounds.  Respiratory: Effort normal.  GI: Soft. Bowel sounds are normal.  Neurological: She is alert and oriented to person, place, and time.  Skin: Skin is warm and dry.  Psychiatric: She has a normal mood and affect. Her behavior is normal. Judgment and thought content normal.    Encounter Medications:   Outpatient Encounter Medications as of 11/30/2017  Medication Sig Note  . ACCU-CHEK SOFTCLIX LANCETS lancets    . acetaminophen (TYLENOL 8 HOUR) 650 MG CR tablet Take 1 tablet (650 mg total) by mouth every 8 (eight) hours as needed for pain.   . ASPIRIN LOW DOSE 81 MG EC  tablet TAKE 1 TABLET (81 MG TOTAL) BY MOUTH DAILY.   Marland Kitchen Blood Glucose Monitoring Suppl (ACCU-CHEK AVIVA PLUS) w/Device KIT  01/04/2016: Received from: External Pharmacy  . doxepin (SINEQUAN) 10 MG capsule Take 1 capsule (10 mg total) by mouth at bedtime.   . DULoxetine (CYMBALTA) 30 MG capsule Take 2 capsules (60 mg total) by mouth daily.   . furosemide (LASIX) 20 MG tablet Take 1 tablet (20 mg total) by mouth 2 (two) times daily.   . Glucose Blood (BLOOD GLUCOSE TEST STRIPS) STRP  07/20/2015: Received from: [facility]  . Insulin Glargine (LANTUS SOLOSTAR) 100 UNIT/ML Solostar Pen Inject 50 Units into the skin daily at 10 pm. 11/30/2017: Patient reports taking 68 units   . insulin regular (NOVOLIN R,HUMULIN R) 100 units/mL injection Inject 0.1 mLs (10 Units total) into the skin 3 (three) times daily before meals. 11/30/2017: Reports taking 40 units   . irbesartan-hydrochlorothiazide (AVALIDE) 300-12.5 MG tablet Take 1 tablet by mouth daily.   Marland Kitchen levothyroxine (SYNTHROID, LEVOTHROID) 25 MCG tablet TAKE 1 TABLET EVERY DAY  EXCEPT TAKE 2 TABLETS  ON  SUNDAYS   . metFORMIN (GLUCOPHAGE-XR) 500 MG 24 hr tablet Take 1 tablet (500 mg total) by mouth daily with breakfast.   . metoprolol tartrate (LOPRESSOR) 25 MG tablet Take 1 tablet (25 mg total) by mouth 2 (two) times daily.   . montelukast (SINGULAIR) 10 MG tablet Take 1 tablet (10 mg total) by mouth at  bedtime.   Marland Kitchen omeprazole (PRILOSEC) 40 MG capsule Take 1 capsule (40 mg total) by mouth daily.   . Potassium 99 MG TABS Take 1 tablet by mouth daily.   . pregabalin (LYRICA) 300 MG capsule Take 2 capsules (600 mg total) by mouth daily.   . rosuvastatin (CRESTOR) 20 MG tablet Take 1 tablet (20 mg total) by mouth daily.   . vitamin C (ASCORBIC ACID) 500 MG tablet Take 500 mg by mouth daily.   Marland Kitchen VITAMIN D, CHOLECALCIFEROL, PO Take 2,000 Units by mouth daily.    . [DISCONTINUED] metFORMIN (GLUCOPHAGE-XR) 500 MG 24 hr tablet Take 1 tablet (500 mg  total) by mouth daily with breakfast.   . mometasone-formoterol (DULERA) 100-5 MCG/ACT AERO Inhale 2 puffs into the lungs 2 (two) times daily. (Patient not taking: Reported on 11/30/2017)   . RELION INSULIN SYRINGE 1ML/31G 31G X 5/16" 1 ML MISC  01/04/2016: Received from: External Pharmacy  . [DISCONTINUED] diclofenac sodium (VOLTAREN) 1 % GEL Reported on 02/22/2016 (Patient not taking: Reported on 11/30/2017)    No facility-administered encounter medications on file as of 11/30/2017.     Functional Status:   In your present state of health, do you have any difficulty performing the following activities: 11/30/2017 11/02/2017  Hearing? N N  Vision? N N  Difficulty concentrating or making decisions? N N  Walking or climbing stairs? Y Y  Comment rolling walker  -  Dressing or bathing? N N  Doing errands, shopping? N N  Preparing Food and eating ? N -  Using the Toilet? N -  In the past six months, have you accidently leaked urine? N -  Do you have problems with loss of bowel control? N -  Managing your Medications? N -  Comment getting on pill pack  -  Managing your Finances? N -  Housekeeping or managing your Housekeeping? Y -  Comment has hired help  -  Some recent data might be hidden    Fall/Depression Screening:    Fall Risk  11/30/2017 11/02/2017 10/02/2017  Falls in the past year? No No No  Comment - - -  Number falls in past yr: - - -  Injury with Fall? - - -  Risk Factor Category  - - -  Comment - - -  Risk for fall due to : - - -  Risk for fall due to: Comment - - -  Follow up - - -   PHQ 2/9 Scores 11/30/2017 11/02/2017 11/02/2017 09/28/2017 09/21/2017 08/31/2017 06/27/2017  PHQ - 2 Score 6 6 0 4 4 0 6  PHQ- 9 Score 23 26 - 18 18 - 24    Assessment:  Initial home visit  Diabetes- monitoring blood sugars 3 times a day, 30 day average 345. Has endocrinologist visit on 3/27. Patient considering follow up nutrition class. Keeping a record of blood sugars 3 to 4 times a day and  food dietary, enjoys sweet cakes, cookie snacks. Reinforced taking insulin as prescribed, notifying MD of elevated readings .  Heart Failure- monitoring and recording daily weights denies sudden increases in weight noted at review of record. Today's weight is 247. RN reinforced with patient to notify MD of weight gain of 3 pounds in a day or 5 in a week along with shortness of breath and weight gain.  Patient monitors her oxygen saturations daily and blood pressures 120-140/70's. Depression-  New to attending psychiatrist and counselor  session, Sleep apnea- not sleeping well at night, wanting  to try using CPAP that is not working, offered assistance with contacting [facility] again. Medications - Patient to begin with [facility] service in April. [facility] pharmacy working with patient on getting [employer] patient assistance for inhaler .   Plan Provided and reviewed [facility] welcome packet. RNCM Placed call to [place], to inform of patient CPAP is not working,service rep not available at that time, requested to return call during my visit if not return call to patient. Provided EMMI handout on tips on weight loss Provided [facility] calendar and instruction on documenting weights,blood pressures.  RNCM will send visit note to PCP.  RNCM will plan return call to patient in the next 2 weeks.    [facility] CM Care Plan Problem One     Most Recent Value  Care Plan Problem One  Patient with weight gain and Diabetes   Role Documenting the Problem One  Care Management [place] for Problem One  Active  [facility]N Long Term Goal   Patient will report weight loss of 10 lbs in the next 60 days   [facility]N Long Term Goal Start Date  11/30/17  Interventions for Problem One Long Term Goal  RNCM discussed with patient benefits of weight loss in helping with controlling and lowering A1c.   [facility] CM Short Term Goal #1   Patient will report contacting her insurance company to determine coverage for dietician/ nutritionist within 2 weeks   [facility] CM Short Term Goal #1 Start Date  11/30/17  Interventions for Short Term Goal #1  RNCM discussed with patient benefits of attending refresher class on nutrition,  for increased knowledge of food choices , meal planning.   [facility]N CM Short Term Goal #2   Patient will report exercsing at 3 days a week for 10 to 15 minutes over the next 30 days   [facility]N CM Short Term Goal #2 Start Date  11/30/17  Interventions for Short Term Goal #2  RN discussed with patient benefits of exercise to help with controlling blood sugar.       Joylene Draft, RN, [place] Management Coordinator  [phone]- Mobile [phone]- Toll Free Main Office

## 2017-11-30 NOTE — Telephone Encounter (Signed)
I returned a call to [place] from [facility] who had recently called wanting clarification on the Metformin. Patient is reporting taking medication twice daily and the instruction are for once daily and the RX is for a quantity of 180. The directions on the Diclofenac are not correct. It just has a reported date. Please correct the prescriptions and resend.

## 2017-12-04 ENCOUNTER — Other Ambulatory Visit: Payer: Self-pay | Admitting: Pharmacy Technician

## 2017-12-04 NOTE — Patient Outreach (Signed)
[facility]) Care Management  12/04/2017  Summer [name] 1951/02/06 [id number]  Incoming call from [name] in reference to [employer] application that I mailed to her to restart the process of getting her Dulera and Proventil inhalers approved. Patient stated she had gotten confused about what she needed to do. Once I explained that we were restarting the process, patient stated she would sign the document and mail it back into me.  [name], [place] Management [phone]

## 2017-12-05 ENCOUNTER — Telehealth: Payer: Self-pay | Admitting: Family Medicine

## 2017-12-05 DIAGNOSIS — E039 Hypothyroidism, unspecified: Secondary | ICD-10-CM | POA: Diagnosis not present

## 2017-12-05 DIAGNOSIS — E785 Hyperlipidemia, unspecified: Secondary | ICD-10-CM | POA: Diagnosis not present

## 2017-12-05 DIAGNOSIS — E1169 Type 2 diabetes mellitus with other specified complication: Secondary | ICD-10-CM | POA: Diagnosis not present

## 2017-12-05 DIAGNOSIS — I10 Essential (primary) hypertension: Secondary | ICD-10-CM | POA: Diagnosis not present

## 2017-12-05 DIAGNOSIS — Z794 Long term (current) use of insulin: Secondary | ICD-10-CM | POA: Diagnosis not present

## 2017-12-05 DIAGNOSIS — E1142 Type 2 diabetes mellitus with diabetic polyneuropathy: Secondary | ICD-10-CM | POA: Diagnosis not present

## 2017-12-05 DIAGNOSIS — E1159 Type 2 diabetes mellitus with other circulatory complications: Secondary | ICD-10-CM | POA: Diagnosis not present

## 2017-12-05 NOTE — Telephone Encounter (Signed)
Copied from [place] [phone]. Topic: Quick Communication - Rx Refill/Question >> Dec 05, 2017  3:48 PM Arletha Grippe wrote: Medication:diclofenac sodium (VOLTAREN) 1 % GEL Has the patient contacted their pharmacy? Yes.   (Agent: If no, request that the patient contact the pharmacy for the refill.) Preferred Pharmacy (with phone number or street name): pill pack called - they called yesterday to get this  med sent to them and clarify the directions.   Agent: Please be advised that RX refills may take up to 3 business days. We ask that you follow-up with your pharmacy.  [place], [place] [phone] (Phone) [phone] (Fax)

## 2017-12-06 NOTE — Telephone Encounter (Signed)
Called prescription in to [facility]. Prescription was sent to wrong pharmacy.

## 2017-12-06 NOTE — Telephone Encounter (Signed)
Ryan from [facility] stating that he doesn't see the Rx in there system stating that it was called in to them for RX for  diclofenac sodium (VOLTAREN) 1 % GEL

## 2017-12-07 NOTE — Telephone Encounter (Signed)
Called pharmacy they have everything that they need.

## 2017-12-11 ENCOUNTER — Other Ambulatory Visit: Payer: Self-pay | Admitting: Pharmacy Technician

## 2017-12-11 NOTE — Patient Outreach (Signed)
[facility]) Care Management  12/11/2017  TAMIRRA SIENKIEWICZ [birth year] [id number]   Received provider portion of [employer] application, Mailing out completed application to [employer] on 04/03.  Maud Deed [place], [place] Management [phone]

## 2017-12-13 ENCOUNTER — Other Ambulatory Visit: Payer: Self-pay | Admitting: *Deleted

## 2017-12-13 ENCOUNTER — Other Ambulatory Visit: Payer: Self-pay | Admitting: Family Medicine

## 2017-12-13 NOTE — Patient Outreach (Signed)
[place] [facility]) Care Management  12/13/2017  Summer [name] 08/20/1951 [id number]   Telephone assessment call  Successful outreach call to patient, HIPAA information verified.  Patient reports feeling pretty good on today, she discussed her recent visit to endocrinologist on this week and he was not pleased with her blood sugar ranges in the 300's, she discussed she just started eating what she wanted not what was good for her.  Patient reports now she is on the right track again,  She is starting over, she is paying closer attention to what she is eating, Patient states [facility] is [facility] [employer] [facility] days a week and she feels better has more energy.   Patient discussed her blood sugar this morning was 117, she is checking reading at least 4 times a day , she states MD has made changes to her insulin doses . Patient denies having any symptoms of low blood sugar or reading , she is able state symptoms and how to treat.   Patient is now interested in attending nutrition class on diabetes to help her in managing her diet.   Plan  Placed call to Dr.O'Connell endocrinology office to request referral to nutrition center at [place] per patient preference spoke with representative .  Will plan home visit in the next 2 weeks for continued education and support of Diabetes.    Joylene Draft, RN, [place] Management Coordinator  [phone]- Mobile [phone]- Toll Free Main Office

## 2017-12-18 ENCOUNTER — Ambulatory Visit: Payer: Self-pay | Admitting: Psychiatry

## 2017-12-24 ENCOUNTER — Ambulatory Visit: Payer: Medicare PPO | Admitting: Podiatry

## 2017-12-24 ENCOUNTER — Encounter: Payer: Self-pay | Admitting: Podiatry

## 2017-12-24 DIAGNOSIS — M2032 Hallux varus (acquired), left foot: Secondary | ICD-10-CM

## 2017-12-24 DIAGNOSIS — M205X1 Other deformities of toe(s) (acquired), right foot: Secondary | ICD-10-CM

## 2017-12-24 DIAGNOSIS — M19072 Primary osteoarthritis, left ankle and foot: Secondary | ICD-10-CM

## 2017-12-24 DIAGNOSIS — E1142 Type 2 diabetes mellitus with diabetic polyneuropathy: Secondary | ICD-10-CM

## 2017-12-24 DIAGNOSIS — E11610 Type 2 diabetes mellitus with diabetic neuropathic arthropathy: Secondary | ICD-10-CM | POA: Diagnosis not present

## 2017-12-24 NOTE — Progress Notes (Signed)
This patient presents the office concerned about the growth on the top of her second toe right foot.  She says she noticed this approximately 7-10 days ago.  She says the area is white , but is causing no pain or discomfort .  Patient is a diabetic with a history of Charcot foot and is concerned about this growth.  She presents the office today for an evaluation and treatment of the second toe right foot.    GENERAL APPEARANCE: Alert, conversant. Appropriately groomed. No acute distress.  VASCULAR: Pedal pulses are  palpable at  [facility] and PT bilateral.  Capillary refill time is immediate to all digits,  Normal temperature gradient.   NEUROLOGIC: sensation is diminished  to 5.07 monofilament at 5/5 sites bilateral.  Light touch is intact bilateral, Muscle strength normal.  MUSCULOSKELETAL: acceptable muscle strength, tone and stability bilateral.  Intrinsic muscluature intact bilateral.  Rectus appearance of foot and digits noted bilateral.  Unilateral pes planus secondary to Charcot foot left foot.  Significant prominence noted at the medial aspect of the rear foot.  Hallux malleus deformity noted at the IPJ of the left hallux. greater than the right hallux.  Mallet toe second toe right foot asymptomatic.  DERMATOLOGIC: skin color, texture, and turgor are within normal limits.  No preulcerative lesions or ulcers  are seen, no interdigital maceration noted.  No open lesions present.  Digital nails are asymptomatic.   Mallet toe second toe right foot.  Charcot foot  Unilateral pes planus.   ROV   Examined her toe growth and determined it is the dorsal aspect of the middle phalanx at the level of the DIPJ. Told patient to keep an eye on this and especially at the tip of the second toe right foot, which becomes most symptomatic. RTC prn     Gardiner Barefoot DPM

## 2017-12-26 ENCOUNTER — Ambulatory Visit: Payer: Self-pay | Admitting: Licensed Clinical Social Worker

## 2017-12-26 ENCOUNTER — Other Ambulatory Visit: Payer: Self-pay

## 2017-12-26 MED ORDER — FUROSEMIDE 20 MG PO TABS: 20.0000 mg | ORAL_TABLET | Freq: Every day | ORAL | 2 refills | Status: DC

## 2017-12-26 NOTE — Telephone Encounter (Signed)
Refill request for general medication: Albuterol Furosemide 20 mg  Last office visit: 11/19/2017  Last physical exam: 06/27/2017  Follow-ups on file. 01/28/2018

## 2017-12-27 ENCOUNTER — Other Ambulatory Visit: Payer: Self-pay | Admitting: Pharmacy Technician

## 2017-12-27 NOTE — Patient Outreach (Signed)
[facility] [facility]) Care Management  12/27/2017  Summer [name] 08-Jun-1951 [id number]   Contacted [employer] patient assistance to check status of [employer]. Spoke to Angelica who stated they received application on 17/47 and mailed attestation letter out to patient on 04/13.  Successful outreach call to patient, HIPAA identifiers verified. Informed patient that attestation letter was mailed to her. Also went over the requirements for filling out the letter and mailing back to company. Requested patient contact me with any questions and/or to inform me when she mails it back.  Will contact patient in next week unless contacted by her.  Maud Deed [place], [place] Management [phone]

## 2018-01-02 ENCOUNTER — Other Ambulatory Visit: Payer: Self-pay | Admitting: *Deleted

## 2018-01-02 ENCOUNTER — Telehealth: Payer: Self-pay

## 2018-01-02 DIAGNOSIS — E1142 Type 2 diabetes mellitus with diabetic polyneuropathy: Secondary | ICD-10-CM

## 2018-01-02 NOTE — Telephone Encounter (Signed)
Copied from [place] [id number]. Topic: Referral - Request >> Jan 02, 2018  1:24 PM Hewitt Shorts wrote: Reason for CRM: pt is interested in the Diabetes class at [facility]  per thn network thru [facility] in [place] number [phone]

## 2018-01-02 NOTE — Patient Outreach (Addendum)
[place] [facility]) Care Management   01/03/2018  RAYELYNN LOYAL 1950-10-13 [id number]  Summer [name] is an 67 y.o. female  Subjective:  Patient discussed having  some good days and bad days. Sometimes just gives up and eats what  she wants.  Patient discussed having pain right back area, got relief after taking ibuprofen. Discussed she notified PCP office, for appointments available .  Patient voiced looking forward to her son from [place] visiting her soon, it been 12 years.   Objective:  BP 138/78 (BP Location: Right Arm, Patient Position: Sitting, Cuff Size: Large)   Pulse 94   Resp 20   Ht 1.575 m ('5\' 2"'$ )   Wt 257 lb (116.6 kg)   SpO2 98%   BMI 47.01 kg/m  Review of Systems  Constitutional: Negative.   HENT: Negative.   Eyes: Negative.   Respiratory: Positive for shortness of breath.   Cardiovascular: Positive for leg swelling.       More swelling in left leg than right  Gastrointestinal: Negative.   Genitourinary: Negative.   Musculoskeletal: Positive for back pain and joint pain.  Skin: Negative.   Neurological: Negative.   Endo/Heme/Allergies: Negative.   Psychiatric/Behavioral: Positive for depression. Negative for suicidal ideas.    Physical Exam  Constitutional: She is oriented to person, place, and time. She appears well-developed and well-nourished.  Cardiovascular: Normal rate and normal heart sounds.  Respiratory: Effort normal and breath sounds normal.  GI: Soft.  Neurological: She is alert and oriented to person, place, and time.  Skin: Skin is warm and dry.  Psychiatric: She has a normal mood and affect. Her behavior is normal. Judgment and thought content normal.    Encounter Medications:   Outpatient Encounter Medications as of 01/02/2018  Medication Sig Note  . ACCU-CHEK SOFTCLIX LANCETS lancets    . acetaminophen (TYLENOL 8 HOUR) 650 MG CR tablet Take 1 tablet (650 mg total) by mouth every 8 (eight) hours as needed for pain.   Marland Kitchen albuterol  (PROVENTIL HFA) 108 (90 Base) MCG/ACT inhaler Inhale into the lungs.   . ASPIRIN LOW DOSE 81 MG EC tablet TAKE 1 TABLET (81 MG TOTAL) BY MOUTH DAILY.   Marland Kitchen Blood Glucose Monitoring Suppl (ACCU-CHEK AVIVA PLUS) w/Device KIT  01/04/2016: Received from: External Pharmacy  . furosemide (LASIX) 20 MG tablet Take 1 tablet (20 mg total) by mouth daily.   . Glucose Blood (BLOOD GLUCOSE TEST STRIPS) STRP  07/20/2015: Received from: [facility]  . insulin glargine (LANTUS) 100 UNIT/ML injection Inject 80 Units into the skin at bedtime.    . insulin regular (NOVOLIN R) 100 units/mL injection INJECT 50 UNITS SUBCTANEOUSLY 3 TIMES A DAY BEFORE MEALS, PLUS SLIDING SCALE. MDD 100 UNITS 01/03/2018: Sliding scale with each meal - IF 200-250 add 4 units, IF 251-300 add 8 units, IF 301-350 add 12 units,IF 351-400 add 16 units , IF over 400 add 20 units.   . irbesartan-hydrochlorothiazide (AVALIDE) 300-12.5 MG tablet Take by mouth.   . levothyroxine (SYNTHROID, LEVOTHROID) 25 MCG tablet TAKE 1 TABLET EVERY DAY  EXCEPT TAKE 2 TABLETS  ON  SUNDAYS   . metFORMIN (GLUCOPHAGE-XR) 500 MG 24 hr tablet Take 500 mg by mouth. 2 tablets daily (total of 1000 mg daily)   . metoprolol tartrate (LOPRESSOR) 25 MG tablet Take 1 tablet (25 mg total) by mouth 2 (two) times daily.   . Misc Natural Products (OSTEO BI-FLEX ADV JOINT SHIELD PO) Take by mouth 2 (two) times  a week.   . montelukast (SINGULAIR) 10 MG tablet Take 1 tablet (10 mg total) by mouth at bedtime.   Marland Kitchen omeprazole (PRILOSEC) 40 MG capsule Take 1 capsule (40 mg total) by mouth daily.   . Potassium 99 MG TABS Take by mouth.   . pregabalin (LYRICA) 300 MG capsule Take 2 capsules (600 mg total) by mouth daily.   Marland Kitchen RELION INSULIN SYRINGE 1ML/31G 31G X 5/16" 1 ML MISC  01/04/2016: Received from: External Pharmacy  . rosuvastatin (CRESTOR) 20 MG tablet Take 1 tablet (20 mg total) by mouth daily.   . vitamin C (ASCORBIC ACID) 500 MG tablet Take 500 mg by mouth daily.    Marland Kitchen VITAMIN D, CHOLECALCIFEROL, PO Take 2,000 Units by mouth daily.    . ciprofloxacin (CIPRO) 250 MG tablet Take 250 mg by mouth daily.   . fluticasone-salmeterol (ADVAIR HFA) 230-21 MCG/ACT inhaler Inhale into the lungs.   . mometasone-formoterol (DULERA) 100-5 MCG/ACT AERO Inhale 2 puffs into the lungs 2 (two) times daily. (Patient not taking: Reported on 01/02/2018)   . UNABLE TO FIND Take by mouth. ascorbic acid, vitamin C, (VITAMIN C) 1000 MG tablet    No facility-administered encounter medications on file as of 01/02/2018.     Functional Status:   In your present state of health, do you have any difficulty performing the following activities: 11/30/2017 11/02/2017  Hearing? N N  Vision? N N  Difficulty concentrating or making decisions? N N  Walking or climbing stairs? Y Y  Comment rolling walker  -  Dressing or bathing? N N  Doing errands, shopping? N N  Preparing Food and eating ? N -  Using the Toilet? N -  In the past six months, have you accidently leaked urine? N -  Do you have problems with loss of bowel control? N -  Managing your Medications? N -  Comment getting on pill pack  -  Managing your Finances? N -  Housekeeping or managing your Housekeeping? Y -  Comment has hired help  -  Some recent data might be hidden    Fall/Depression Screening:    Fall Risk  11/30/2017 11/02/2017 10/02/2017  Falls in the past year? No No No  Comment - - -  Number falls in past yr: - - -  Injury with Fall? - - -  Risk Factor Category  - - -  Comment - - -  Risk for fall due to : - - -  Risk for fall due to: Comment - - -  Follow up - - -   PHQ 2/9 Scores 11/30/2017 11/02/2017 11/02/2017 09/28/2017 09/21/2017 08/31/2017 06/27/2017  PHQ - 2 Score 6 6 0 4 4 0 6  PHQ- 9 Score 23 26 - 18 18 - 24    Assessment:  Routine home visit  Diabetes Monitoring blood sugar reading at least 3 times daily, 30 day average 255. Patient reports no hypoglycemic episodes, is able to state action plan of  treating , Endorses taking insulin as prescribed. Not following diet, interested in nutrition class  Reports still working toward going to [employer] at least twice weekly.  Has podiatrist appointment  Heart failure  Has been out of lasix for the last 2 weeks, reports being due to switching over the Pill Pack. Patient now as lasix as of 4/23. Weight have been in the 253  to 257 range this month, today weight 253  Depression  Has not attended psychiatrist on this month due to  cost of co payment and bill of part not covered by insurance, considering restarting with counselor and contacting insurance company regarding billing.  Sleep Apnea  Not using CPAP due to machine not powering on. She has not heard back from [place] from previous call, not eligible for new machine until 2020.   Plan  Call to PCP office regarding patient interest in attending Diabetes class , and to Dr.O'Connell office to report blood 30 day average.  Reinforced notifying MD of sudden weight gains of 3 pounds in a day and 5 in a week with increased swelling and shortness of breath. Reinforced attending mental health appointments. Placed call to [place] to discuss CPAP will not power on, trouble shooting problem,  representative plans to send patient new "brick " that is connected to power cord, at no cost , patient is agreeable and understands which  part to return to company in return envelope to be sent.  Will plan return call in the next 2 weeks.  Reinforced with patient to notify MD of unresolved pain to right back area.   THN CM Care Plan Problem One     Most Recent Value  Care Plan Problem One  Patient with weight gain and Diabetes   Role Documenting the Problem One  Care Management [place] for Problem One  Active  THN Long Term Goal   Patient will report weight loss of 10 lbs in the next 60 days   THN Long Term Goal Start Date  11/30/17  Interventions for Problem One Long Term Goal  Advised  regarding taking medications as prescribed, notifying MD sooner of medications nearing refills   THN CM Short Term Goal #1   Patient will report attending diabetes nutrition class in the next 30 days [goal adjusted ]  THN CM Short Term Goal #1 Start Date  01/02/18 Barrie Folk restarted ]  Interventions for Short Term Goal #1  Placed call to PCP office for orders for referral to nutrition center for class   [facility] CM Short Term Goal #2   Patient will report exercsing at 3 days a week for 10 to 15 minutes over the next 30 days   THN CM Short Term Goal #2 Start Date  01/02/18  Interventions for Short Term Goal #2  RN encouraged patient with benefits of continuing exercise in helping with lowering glucose   THN CM Short Term Goal #3  Over the next 30 days patient will begin to weigh self and keep a record .  THN CM Short Term Goal #3 Start Date  12/13/17  Interventions for Short Tern Goal #3  Reviewed patient written log of weights        Joylene Draft, RN, [place] Management Coordinator  [phone]- Mobile [phone]- [place] Office

## 2018-01-08 ENCOUNTER — Other Ambulatory Visit: Payer: Self-pay | Admitting: Pharmacy Technician

## 2018-01-08 NOTE — Patient Outreach (Signed)
[facility]) Care Management  01/08/2018  Summer [name] 06-21-1951 [id number]   Contacted [facility] to check for delivery status of patient Summer [name] and Proventil that was approved on 04/26. Spoke to Van who stated the orders have not been submitted from [employer] as of yet however, he requested I check back on Friday, May 3rd.  Will call pharmacy back on May 3rd  Sukhman Kocher N. [place], [place] Management [phone]

## 2018-01-09 ENCOUNTER — Ambulatory Visit: Payer: Medicare HMO | Admitting: Orthotics

## 2018-01-11 ENCOUNTER — Other Ambulatory Visit: Payer: Self-pay | Admitting: Pharmacy Technician

## 2018-01-11 NOTE — Patient Outreach (Signed)
[place] [facility]) Care Management  01/11/2018  NEVAEHA FINERTY 11-19-1950 [id number]   Contacted [facility] ([employer]) to check delivery status for patients Dulera and Proventil. Representative informed me it will be shipped out on Monday and the patient should expect to get it in 5-7 days.  Will contact patient 05/13 to see if med has been received.  Maud Deed [place], [place] Management [phone]

## 2018-01-16 ENCOUNTER — Other Ambulatory Visit: Payer: Self-pay | Admitting: *Deleted

## 2018-01-16 NOTE — Patient Outreach (Signed)
[place] [facility]) Care Management  01/16/2018  Summer [name] 04-06-1951 [id number]  Telephone assessment    Referral received 2/24 from Telephonic case management Prior referral source [employer] direct, Referral reason : Community case management  Per chart review patient history includesbut not limited to Hypertension , Diabetes,HFpEF , Depression, Asthma, CKD   Placed call to patient , no answer able to leave a HIPAA compliant message requesting return call.   Plan Will await return call if no response will plan follow up call in a week.   Joylene Draft, RN, [place] Management Coordinator  [phone]- Mobile [phone]- Toll Free Main Office

## 2018-01-21 ENCOUNTER — Other Ambulatory Visit: Payer: Self-pay | Admitting: Pharmacy Technician

## 2018-01-21 NOTE — Patient Outreach (Signed)
[place] [facility]) Care Management  01/21/2018  MELANNY WIRE [birth year] [id number]   Successful outreach to patient, HIPAA identifiers verified. Patient states she has not received inhalers in the mail from [place] yet.  Will follow up with patient on 05/17 to check on delivery.  Maud Deed [place], [place] Management [phone]

## 2018-01-22 ENCOUNTER — Other Ambulatory Visit: Payer: Self-pay | Admitting: *Deleted

## 2018-01-22 ENCOUNTER — Ambulatory Visit: Payer: Self-pay | Admitting: *Deleted

## 2018-01-22 NOTE — Patient Outreach (Signed)
[name] [facility]) Care Management  01/22/2018  Summer [name] 07-29-1951 [id number]   Telephone follow up call   1010 Unsuccessful telephone outreach call patient , no answer able to leave a HIPAA compliant message for return call...   1555 2nd call attempt today, no answer , able to leave a HIPAA compliant message for return call.   Plan  Will plan follow up call in the next week  to follow up on progress of blood sugars, CPAP equipment arrival. If unable to contact patient will send outreach letter.    Joylene Draft, RN, [place] Management Coordinator  [phone]- Mobile [phone]- Toll Free Main Office

## 2018-01-23 ENCOUNTER — Ambulatory Visit: Payer: Medicare HMO | Admitting: Orthotics

## 2018-01-24 ENCOUNTER — Other Ambulatory Visit: Payer: Self-pay | Admitting: Family Medicine

## 2018-01-24 DIAGNOSIS — E1142 Type 2 diabetes mellitus with diabetic polyneuropathy: Secondary | ICD-10-CM

## 2018-01-24 NOTE — Telephone Encounter (Signed)
Refill request Novolin r 100 u / ml  Refill request Lantus 100 u / ml    LOV 11/19/2017  Dr Ancil Boozer   Pharmacy Pill Pack [place]

## 2018-01-24 NOTE — Telephone Encounter (Signed)
Copied from [place] [id number]. Topic: Quick Communication - Rx Refill/Question >> Jan 24, 2018  1:36 PM [place]ne[place], Ja-Kwan wrote: Medication: insulin regular (NOVOLIN R) 100 units/mL injection and insulin glargine (LANTUS) 100 UNIT/ML injection   Preferred Pharmacy (with phone number or street name): [facility] [place], [place] [phone] (Phone) [phone] (Fax)  Agent: Please be advised that RX refills may take up to 3 business days. We ask that you follow-up with your pharmacy.

## 2018-01-25 ENCOUNTER — Other Ambulatory Visit: Payer: Self-pay | Admitting: Pharmacy Technician

## 2018-01-25 NOTE — Patient Outreach (Signed)
[place] [facility]) Care Management  01/25/2018  TENIKA KEERAN [birth year] [id number]   Successful outreach call to Ms. Vickki Hearing, HIPAA identifiers verified. Ms. Zeller states that she received her Dulera and Proventil inhalers in the mail today. Counseled patient on how to obtain refills. Patient stated she would look thru paperwork that was sent with inhalers.  Will route note to [place] for case closure.  Maud Deed [place], [place] Management [phone]

## 2018-01-28 ENCOUNTER — Ambulatory Visit (INDEPENDENT_AMBULATORY_CARE_PROVIDER_SITE_OTHER): Payer: Medicare HMO | Admitting: Family Medicine

## 2018-01-28 ENCOUNTER — Encounter: Payer: Self-pay | Admitting: Family Medicine

## 2018-01-28 ENCOUNTER — Other Ambulatory Visit: Payer: Self-pay | Admitting: Family Medicine

## 2018-01-28 VITALS — BP 118/60 | HR 83 | Temp 98.0°F | Resp 20 | Ht 62.0 in | Wt 247.0 lb

## 2018-01-28 DIAGNOSIS — N183 Chronic kidney disease, stage 3 unspecified: Secondary | ICD-10-CM

## 2018-01-28 DIAGNOSIS — E1165 Type 2 diabetes mellitus with hyperglycemia: Secondary | ICD-10-CM

## 2018-01-28 DIAGNOSIS — F331 Major depressive disorder, recurrent, moderate: Secondary | ICD-10-CM | POA: Diagnosis not present

## 2018-01-28 DIAGNOSIS — R1013 Epigastric pain: Secondary | ICD-10-CM

## 2018-01-28 DIAGNOSIS — K219 Gastro-esophageal reflux disease without esophagitis: Secondary | ICD-10-CM | POA: Diagnosis not present

## 2018-01-28 DIAGNOSIS — E1140 Type 2 diabetes mellitus with diabetic neuropathy, unspecified: Secondary | ICD-10-CM

## 2018-01-28 MED ORDER — RANITIDINE HCL 150 MG PO TABS: 150.0000 mg | ORAL_TABLET | Freq: Every day | ORAL | 0 refills | Status: DC

## 2018-01-28 MED ORDER — DULOXETINE HCL 30 MG PO CPEP: 30.0000 mg | ORAL_CAPSULE | Freq: Every day | ORAL | 0 refills | Status: DC

## 2018-01-28 NOTE — Progress Notes (Signed)
Name: Summer [name]   MRN: [id number]    DOB: 01-10-1951   Date:01/28/2018       Progress Note  Subjective  Chief Complaint  Chief Complaint  Patient presents with  . Medication Refill  . Diabetes    seeing Dr. Honor Junes,  . Hypertension    Swelling in feet and ankles  . Depression  . Abdominal Pain    Bloating and feels like a "ring of fire" when she eats    HPI  DMII: she is seeing Dr. Manfred Shirts but not hgbA1C or urine micro since last year, we will check it today. She states not compliant with her diet, eats anything she wants because of her depression. It helps her cope with feeling bad about herself. Glucose is still running high at home, 325 this am. She states taking medication as she is supposed to. We will check A1C and urine micro and send copy to Dr. Honor Junes  HTN: doing better, bp is at goal. She states she rested before she came back to the exam room. No palpitation, but she has lower extremity edema - chronic. She has CKI but function improved last visit   Major Depression: still feels down all the time, Phq9 is very high, she went to psychiatrist and also therapist but cannot afford going back because of cost.  She is out of medications and would like to go back on it. She states for a period of time she was crying for no reason and at times getting mad.   Abdominal pain: she states having worsening of heartburn, also has epigastric burning sensation and bloating, worse after meals. No fever or chills, no change in bowel movements, she has some gagging with coughing.   Patient Active Problem List   Diagnosis Date Noted  . (HFpEF) heart failure with preserved ejection fraction ([place]) 08/31/2017  . Moderate persistent asthma 08/31/2017  . Hyperlipidemia due to type 2 diabetes mellitus ([place]) 06/14/2017  . Chest pain of unknown etiology 10/06/2016  . Essential hypertension 10/06/2016  . Morbid obesity with BMI of 40.0-44.9, adult ([place]) 10/06/2016  . Dyslipidemia associated  with type 2 diabetes mellitus ([place]) 08/18/2016  . Charcot foot due to diabetes mellitus ([place]) 02/22/2016  . Hypertriglyceridemia 07/27/2015  . Acquired abduction deformity of foot 07/12/2015  . Osteoarthritis of subtalar joint 07/12/2015  . Poorly controlled type 2 diabetes mellitus with neuropathy ([place]) 07/12/2015  . Arthritis of foot, degenerative 07/12/2015  . Carpal tunnel syndrome 04/17/2015  . Chronic constipation 04/17/2015  . Insomnia, persistent 04/17/2015  . Chronic kidney disease (CKD), stage III (moderate) ([place]) 04/17/2015  . Decreased exercise tolerance 04/17/2015  . Diabetes mellitus with polyneuropathy ([place]) 04/17/2015  . Gastro-esophageal reflux disease without esophagitis 04/17/2015  . Bursitis, trochanteric 04/17/2015  . Cephalalgia 04/17/2015  . Benign hypertension 04/17/2015  . Adult hypothyroidism 04/17/2015  . Hearing loss 04/17/2015  . Chronic recurrent major depressive disorder ([place]) 04/17/2015  . Neurogenic claudication 04/17/2015  . Obesity (BMI 35.0-39.9 without comorbidity) 04/17/2015  . Hypo-ovarianism 04/17/2015  . Perennial allergic rhinitis with seasonal variation 04/17/2015  . Acne erythematosa 04/17/2015  . Dyskinesia, tardive 04/17/2015  . Memory loss 04/17/2015  . Impingement syndrome of shoulder 04/17/2015  . Dermatitis, stasis 04/17/2015  . Obstructive sleep apnea 05/14/2014  . Dyspnea 05/06/2014  . Hyperlipidemia 02/06/2012  . LBP (low back pain) 09/16/2008    Past Surgical History:  Procedure Laterality Date  . ANKLE SURGERY Left approx Jan 2018  . CATARACT EXTRACTION  01/2011  right  . eye lid surgery  2013   bilateral  . FOOT SURGERY    . NECK SURGERY    . TUBAL LIGATION    . VAGINAL HYSTERECTOMY  1989    Family History  Problem Relation Age of Onset  . Heart attack Mother   . Aneurysm Mother     Social History   Socioeconomic History  . Marital status: Significant Other    Spouse name: Not on file  . Number of  children: 1  . Years of education: Not on file  . Highest education level: High school graduate  Occupational History  . Not on file  Social Needs  . Financial resource strain: Not hard at all  . Food insecurity:    Worry: Never true    Inability: Never true  . Transportation needs:    Medical: No    Non-medical: No  Tobacco Use  . Smoking status: Never Smoker  . Smokeless tobacco: Never Used  Substance and Sexual Activity  . Alcohol use: No    Alcohol/week: 0.0 - 0.6 oz    Frequency: Never  . Drug use: No  . Sexual activity: Yes    Partners: Male  Lifestyle  . Physical activity:    Days per week: 0 days    Minutes per session: 0 min  . Stress: Rather much  Relationships  . Social connections:    Talks on phone: More than three times a week    Gets together: Once a week    Attends religious service: Never    Active member of club or organization: No    Attends meetings of clubs or organizations: Never    Relationship status: Living with partner  . Intimate partner violence:    Fear of current or ex partner: No    Emotionally abused: No    Physically abused: No    Forced sexual activity: No  Other Topics Concern  . Not on file  Social History Narrative  . Not on file     Current Outpatient Medications:  .  ACCU-CHEK SOFTCLIX LANCETS lancets, , Disp: , Rfl:  .  acetaminophen (TYLENOL 8 HOUR) 650 MG CR tablet, Take 1 tablet (650 mg total) by mouth every 8 (eight) hours as needed for pain., Disp: 90 tablet, Rfl: 0 .  albuterol (PROVENTIL HFA) 108 (90 Base) MCG/ACT inhaler, Inhale 2 puffs into the lungs 2 (two) times daily as needed for shortness of breath. , Disp: , Rfl:  .  Alcohol Swabs (B-D SINGLE USE SWABS REGULAR) PADS, , Disp: , Rfl:  .  ASPIRIN LOW DOSE 81 MG EC tablet, TAKE 1 TABLET (81 MG TOTAL) BY MOUTH DAILY., Disp: 30 tablet, Rfl: 0 .  Blood Glucose Monitoring Suppl (ACCU-CHEK AVIVA PLUS) w/Device KIT, , Disp: , Rfl:  .  furosemide (LASIX) 20 MG tablet,  Take 1 tablet (20 mg total) by mouth daily., Disp: 30 tablet, Rfl: 2 .  Glucose Blood (BLOOD GLUCOSE TEST STRIPS) STRP, , Disp: , Rfl:  .  insulin glargine (LANTUS) 100 UNIT/ML injection, Inject 80 Units into the skin at bedtime. , Disp: , Rfl:  .  insulin regular (NOVOLIN R) 100 units/mL injection, INJECT 50 UNITS SUBCTANEOUSLY 3 TIMES A DAY BEFORE MEALS, PLUS SLIDING SCALE. MDD 100 UNITS, Disp: , Rfl:  .  Insulin Syringe-Needle U-100 31G X 15/64" 1 ML MISC, ONE  THREE TIMES DAILY, Disp: , Rfl:  .  Lancets (ACCU-CHEK SOFT TOUCH) lancets, Use 1 each 4 (four) times  daily, Disp: , Rfl:  .  levothyroxine (SYNTHROID, LEVOTHROID) 25 MCG tablet, TAKE 1 TABLET EVERY DAY  EXCEPT TAKE 2 TABLETS  ON  SUNDAYS, Disp: 100 tablet, Rfl: 1 .  metFORMIN (GLUCOPHAGE-XR) 500 MG 24 hr tablet, Take 500 mg by mouth. 2 tablets daily (total of 1000 mg daily), Disp: , Rfl:  .  metoprolol tartrate (LOPRESSOR) 25 MG tablet, Take 1 tablet (25 mg total) by mouth 2 (two) times daily., Disp: 180 tablet, Rfl: 1 .  Misc Natural Products (OSTEO BI-FLEX ADV JOINT SHIELD PO), Take by mouth 2 (two) times a week., Disp: , Rfl:  .  mometasone-formoterol (DULERA) 100-5 MCG/ACT AERO, Inhale 2 puffs into the lungs 2 (two) times daily., Disp: 3 Inhaler, Rfl: 1 .  montelukast (SINGULAIR) 10 MG tablet, Take 1 tablet (10 mg total) by mouth at bedtime., Disp: 90 tablet, Rfl: 1 .  omeprazole (PRILOSEC) 40 MG capsule, Take 1 capsule (40 mg total) by mouth daily., Disp: 90 capsule, Rfl: 1 .  Potassium 99 MG TABS, Take by mouth., Disp: , Rfl:  .  pregabalin (LYRICA) 300 MG capsule, Take 2 capsules (600 mg total) by mouth daily., Disp: 180 capsule, Rfl: 1 .  RELION INSULIN SYRINGE 1ML/31G 31G X 5/16" 1 ML MISC, , Disp: , Rfl:  .  rosuvastatin (CRESTOR) 20 MG tablet, Take 1 tablet (20 mg total) by mouth daily., Disp: 90 tablet, Rfl: 1 .  vitamin C (ASCORBIC ACID) 500 MG tablet, Take 500 mg by mouth 2 (two) times daily. , Disp: , Rfl:  .  VITAMIN D,  CHOLECALCIFEROL, PO, Take 2,000 Units by mouth daily. , Disp: , Rfl:  .  DULoxetine (CYMBALTA) 30 MG capsule, Take 1 capsule (30 mg total) by mouth daily., Disp: 30 capsule, Rfl: 0 .  fluticasone-salmeterol (ADVAIR HFA) 179-15 MCG/ACT inhaler, Inhale into the lungs. , Disp: , Rfl:  .  irbesartan-hydrochlorothiazide (AVALIDE) 300-12.5 MG tablet, Take by mouth., Disp: , Rfl:  .  ranitidine (ZANTAC) 150 MG tablet, Take 1 tablet (150 mg total) by mouth at bedtime., Disp: 90 tablet, Rfl: 0  Allergies  Allergen Reactions  . Codeine Other (See Comments)    "TRIPPED OUT"  DIDN'T LIKE THE [place]  . Atorvastatin     muscle pain  . Latex Rash  . Zolpidem Other (See Comments)    Sleep walk     ROS  Constitutional: Negative for fever or significant weight change.  Respiratory: She has a mild  cough but has shortness of breath with activity.   Cardiovascular: Negative for chest pain or palpitations.  Gastrointestinal: Negative for abdominal pain, no bowel changes.  Musculoskeletal: Positive  for gait problem and intermittent  joint swelling.  Skin: Negative for rash.  Neurological: Negative for dizziness or headache.  No other specific complaints in a complete review of systems (except as listed in HPI above).  Objective  Vitals:   01/28/18 1444  BP: 118/60  Pulse: 83  Resp: 20  Temp: 98 F (36.7 C)  TempSrc: Oral  SpO2: 96%  Weight: 247 lb (112 kg)  Height: '5\' 2"'$  (1.575 m)    Body mass index is 45.18 kg/m.  Physical Exam  Constitutional: Patient appears well-developed and well-nourished. Obese  No distress.  HEENT: head atraumatic, normocephalic, pupils equal and reactive to light,neck supple, throat within normal limits Cardiovascular: Normal rate, regular rhythm and normal heart sounds.  No murmur heard. 1 plus   BLE edema. Pulmonary/Chest: Effort normal and breath sounds normal. No  respiratory distress. Abdominal: Soft.  There is no tenderness. Psychiatric: Patient  has a normal mood and affect. behavior is normal. Judgment and thought content normal. Muscular Skeletal: uses a walker, moves slowly   Recent Results (from the past 2160 hour(s))  POCT HgB A1C     Status: Abnormal   Collection Time: 11/02/17  1:53 PM  Result Value Ref Range   Hemoglobin A1C 8.8       PHQ2/9: Depression screen [facility] 2/9 01/28/2018 11/30/2017 11/02/2017 11/02/2017 09/28/2017  Decreased Interest '3 3 3 '$ 0 3  Down, Depressed, Hopeless '3 3 3 '$ 0 1  PHQ - 2 Score '6 6 6 '$ 0 4  Altered sleeping '3 3 3 '$ - 2  Tired, decreased energy '3 3 3 '$ - 2  Change in appetite '3 3 3 '$ - 3  Feeling bad or failure about yourself  '3 3 3 '$ - 3  Trouble concentrating '3 3 3 '$ - 2  Moving slowly or fidgety/restless '3 1 2 '$ - 0  Suicidal thoughts '2 1 3 '$ - 2  PHQ-9 Score '26 23 26 '$ - 18  Difficult doing work/chores Very difficult Somewhat difficult Very difficult - Very difficult  Some recent data might be hidden   \  Fall Risk: Fall Risk  01/28/2018 11/30/2017 11/02/2017 10/02/2017 09/28/2017  Falls in the past year? No No No No No  Comment - - - - -  Number falls in past yr: - - - - -  Injury with Fall? - - - - -  Risk Factor Category  - - - - -  Comment - - - - -  Risk for fall due to : - - - - -  Risk for fall due to: Comment - - - - -  Follow up - - - - -     Functional Status Survey: Is the patient deaf or have difficulty hearing?: No Does the patient have difficulty seeing, even when wearing glasses/contacts?: Yes Does the patient have difficulty concentrating, remembering, or making decisions?: No Does the patient have difficulty walking or climbing stairs?: Yes Does the patient have difficulty dressing or bathing?: No Does the patient have difficulty doing errands alone such as visiting a doctor's office or shopping?: No    Assessment & Plan  1. Poorly controlled type 2 diabetes mellitus with neuropathy ([name])  - Urine Microalbumin w/creat. ratio - HgB A1c  2. MDD (major depressive disorder),  recurrent episode, moderate ([place])  she was seeing psychiatrist but cannot afford going back, states would not commit suicide but discussed suicide hotline She has been out of medication for months and seems to be getting worse  - DULoxetine (CYMBALTA) 30 MG capsule; Take 1 capsule (30 mg total) by mouth daily.  Dispense: 30 capsule; Refill: 0  Advised patient to contact psychiatrist and see if they can see her less often because of cost. I will send 30 days to give her time to contact them.   3. Chronic kidney disease (CKD), stage III (moderate) ([name])  Reviewed labs, improved, continue ARB  4. GERD without esophagitis  Symptoms are back, resume Ranitdine at night, check for h. Pylori and refer to GI if h. Pylori test negative and no improvement of symptoms.  - ranitidine (ZANTAC) 150 MG tablet; Take 1 tablet (150 mg total) by mouth at bedtime.  Dispense: 90 tablet; Refill: 0  5. Dyspepsia  - H. pylori breath test

## 2018-01-29 ENCOUNTER — Ambulatory Visit: Payer: Self-pay | Admitting: Dietician

## 2018-01-29 LAB — HEMOGLOBIN A1C
Hgb A1c MFr Bld: 9.6 %{Hb} — ABNORMAL HIGH
Mean Plasma Glucose: 229 (calc)
eAG (mmol/L): 12.7 (calc)

## 2018-01-29 LAB — MICROALBUMIN / CREATININE URINE RATIO
Creatinine, Urine: 41 mg/dL (ref 20–275)
Microalb Creat Ratio: 15 ug/mg{creat}
Microalb, Ur: 0.6 mg/dL

## 2018-01-29 LAB — H. PYLORI BREATH TEST: H. pylori Breath Test: NOT DETECTED

## 2018-01-30 ENCOUNTER — Other Ambulatory Visit: Payer: Self-pay | Admitting: *Deleted

## 2018-01-30 ENCOUNTER — Encounter: Payer: Self-pay | Admitting: *Deleted

## 2018-01-30 ENCOUNTER — Ambulatory Visit (INDEPENDENT_AMBULATORY_CARE_PROVIDER_SITE_OTHER): Payer: Medicare HMO | Admitting: Nurse Practitioner

## 2018-01-30 ENCOUNTER — Encounter: Payer: Self-pay | Admitting: Nurse Practitioner

## 2018-01-30 VITALS — BP 136/74 | HR 100 | Temp 98.1°F | Resp 16 | Ht 62.0 in | Wt 254.4 lb

## 2018-01-30 DIAGNOSIS — R109 Unspecified abdominal pain: Secondary | ICD-10-CM

## 2018-01-30 DIAGNOSIS — M549 Dorsalgia, unspecified: Secondary | ICD-10-CM | POA: Diagnosis not present

## 2018-01-30 LAB — POCT URINALYSIS DIPSTICK
Bilirubin, UA: NEGATIVE
Blood, UA: NEGATIVE
Glucose, UA: NEGATIVE
Ketones, UA: NEGATIVE
Leukocytes, UA: NEGATIVE
Nitrite, UA: NEGATIVE
Protein, UA: NEGATIVE
Spec Grav, UA: <=1.005 — AB (ref 1.010–1.025)
Urobilinogen, UA: NEGATIVE E.U./dL — AB
pH, UA: 5 (ref 5.0–8.0)

## 2018-01-30 LAB — CBC
HCT: 36.7 % (ref 35.0–45.0)
Hemoglobin: 12.3 g/dL (ref 11.7–15.5)
MCH: 28.5 pg (ref 27.0–33.0)
MCHC: 33.5 g/dL (ref 32.0–36.0)
MCV: 85.2 fL (ref 80.0–100.0)
MPV: 11.8 fL (ref 7.5–12.5)
Platelets: 252 10*3/uL (ref 140–400)
RBC: 4.31 10*6/uL (ref 3.80–5.10)
RDW: 15.4 % — ABNORMAL HIGH (ref 11.0–15.0)
WBC: 9.6 10*3/uL (ref 3.8–10.8)

## 2018-01-30 LAB — COMPLETE METABOLIC PANEL WITH GFR
AG Ratio: 2 (calc) (ref 1.0–2.5)
ALT: 24 U/L (ref 6–29)
AST: 22 U/L (ref 10–35)
Albumin: 4.4 g/dL (ref 3.6–5.1)
Alkaline phosphatase (APISO): 97 U/L (ref 33–130)
BUN/Creatinine Ratio: 31 (calc) — ABNORMAL HIGH (ref 6–22)
BUN: 44 mg/dL — ABNORMAL HIGH (ref 7–25)
CO2: 27 mmol/L (ref 20–32)
Calcium: 9.8 mg/dL (ref 8.6–10.4)
Chloride: 100 mmol/L (ref 98–110)
Creat: 1.4 mg/dL — ABNORMAL HIGH (ref 0.50–0.99)
GFR, Est African American: 44 mL/min/{1.73_m2} — ABNORMAL LOW (ref >=60)
GFR, Est Non African American: 38 mL/min/{1.73_m2} — ABNORMAL LOW (ref >=60)
Globulin: 2.2 g/dL (calc) (ref 1.9–3.7)
Glucose, Bld: 220 mg/dL — ABNORMAL HIGH (ref 65–139)
Potassium: 4.8 mmol/L (ref 3.5–5.3)
Sodium: 139 mmol/L (ref 135–146)
Total Bilirubin: 0.5 mg/dL (ref 0.2–1.2)
Total Protein: 6.6 g/dL (ref 6.1–8.1)

## 2018-01-30 MED ORDER — TIZANIDINE HCL 4 MG PO TABS: 4.0000 mg | ORAL_TABLET | Freq: Four times a day (QID) | ORAL | 0 refills | Status: DC | PRN

## 2018-01-30 NOTE — Progress Notes (Addendum)
Name: Summer [name]   MRN: [id number]    DOB: May 08, 1951   Date:01/30/2018       Progress Note  Subjective  Chief Complaint  Chief Complaint  Patient presents with  . Flank Pain    right side for 2 days    HPI  Right flank pain Tuesday morning- woke up and then noticed it- burning pain. Non-tender. No heavy lifting, straining. Pain is worse with movement. Patient denies dysuria, hematuria, vaginal discharge or bleeding. Pain unchanged with meals but notes colic quality- one time got so bad she was going to go to the ER but decided to wait till appointment and pain decreased. Denies n/v/d, constipation. Patient has tried ice cream and laying down without relief of symptoms. Stretching provided mild relief. States tried 4 tylenol and 6 ibuprofen once without relief of symptoms. Patient had kidney stone before but this pain isnt that bad.   Patient Active Problem List   Diagnosis Date Noted  . (HFpEF) heart failure with preserved ejection fraction ([place]) 08/31/2017  . Moderate persistent asthma 08/31/2017  . Hyperlipidemia due to type 2 diabetes mellitus ([place]) 06/14/2017  . Chest pain of unknown etiology 10/06/2016  . Essential hypertension 10/06/2016  . Morbid obesity with BMI of 40.0-44.9, adult ([place]) 10/06/2016  . Dyslipidemia associated with type 2 diabetes mellitus ([place]) 08/18/2016  . Charcot foot due to diabetes mellitus ([place]) 02/22/2016  . Hypertriglyceridemia 07/27/2015  . Acquired abduction deformity of foot 07/12/2015  . Osteoarthritis of subtalar joint 07/12/2015  . Poorly controlled type 2 diabetes mellitus with neuropathy ([place]) 07/12/2015  . Arthritis of foot, degenerative 07/12/2015  . Carpal tunnel syndrome 04/17/2015  . Chronic constipation 04/17/2015  . Insomnia, persistent 04/17/2015  . Chronic kidney disease (CKD), stage III (moderate) ([place]) 04/17/2015  . Decreased exercise tolerance 04/17/2015  . Diabetes mellitus with polyneuropathy ([place]) 04/17/2015  .  Gastro-esophageal reflux disease without esophagitis 04/17/2015  . Bursitis, trochanteric 04/17/2015  . Cephalalgia 04/17/2015  . Benign hypertension 04/17/2015  . Adult hypothyroidism 04/17/2015  . Hearing loss 04/17/2015  . Chronic recurrent major depressive disorder ([place]) 04/17/2015  . Neurogenic claudication 04/17/2015  . Obesity (BMI 35.0-39.9 without comorbidity) 04/17/2015  . Hypo-ovarianism 04/17/2015  . Perennial allergic rhinitis with seasonal variation 04/17/2015  . Acne erythematosa 04/17/2015  . Dyskinesia, tardive 04/17/2015  . Memory loss 04/17/2015  . Impingement syndrome of shoulder 04/17/2015  . Dermatitis, stasis 04/17/2015  . Obstructive sleep apnea 05/14/2014  . Dyspnea 05/06/2014  . Hyperlipidemia 02/06/2012  . LBP (low back pain) 09/16/2008    Past Medical History:  Diagnosis Date  . Anemia   . Arthritis   . Asthma   . Carpal tunnel syndrome   . Chronic kidney disease   . Depressive disorder   . Diabetes mellitus   . Dyspnea   . GERD (gastroesophageal reflux disease)   . Headache   . History of hiatal hernia   . Hyperlipidemia   . Hypertension   . Insomnia   . Lumbago   . Memory loss   . Obesity   . 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)   . Pneumonia   . Rhinitis, allergic   . Rosacea   . Thyroid disease   . Unspecified hearing loss   . Unspecified hereditary and idiopathic peripheral neuropathy   . Unspecified sleep apnea     Past Surgical History:  Procedure Laterality Date  . ANKLE SURGERY Left approx Jan 2018  . CATARACT EXTRACTION  01/2011   right  . eye  lid surgery  2013   bilateral  . FOOT SURGERY    . NECK SURGERY    . TUBAL LIGATION    . VAGINAL HYSTERECTOMY  1989    Social History   Tobacco Use  . Smoking status: Never Smoker  . Smokeless tobacco: Never Used  Substance Use Topics  . Alcohol use: No    Alcohol/week: 0.0 - 0.6 oz    Frequency: Never     Current Outpatient Medications:  .  ACCU-CHEK SOFTCLIX LANCETS  lancets, , Disp: , Rfl:  .  acetaminophen (TYLENOL 8 HOUR) 650 MG CR tablet, Take 1 tablet (650 mg total) by mouth every 8 (eight) hours as needed for pain., Disp: 90 tablet, Rfl: 0 .  albuterol (PROVENTIL HFA) 108 (90 Base) MCG/ACT inhaler, Inhale 2 puffs into the lungs 2 (two) times daily as needed for shortness of breath. , Disp: , Rfl:  .  Alcohol Swabs (B-D SINGLE USE SWABS REGULAR) PADS, , Disp: , Rfl:  .  ASPIRIN LOW DOSE 81 MG EC tablet, TAKE 1 TABLET (81 MG TOTAL) BY MOUTH DAILY., Disp: 30 tablet, Rfl: 0 .  Blood Glucose Monitoring Suppl (ACCU-CHEK AVIVA PLUS) w/Device KIT, , Disp: , Rfl:  .  DULoxetine (CYMBALTA) 30 MG capsule, Take 1 capsule (30 mg total) by mouth daily., Disp: 30 capsule, Rfl: 0 .  fluticasone-salmeterol (ADVAIR HFA) 397-67 MCG/ACT inhaler, Inhale into the lungs. , Disp: , Rfl:  .  furosemide (LASIX) 20 MG tablet, Take 1 tablet (20 mg total) by mouth daily., Disp: 30 tablet, Rfl: 2 .  Glucose Blood (BLOOD GLUCOSE TEST STRIPS) STRP, , Disp: , Rfl:  .  insulin glargine (LANTUS) 100 UNIT/ML injection, Inject 80 Units into the skin at bedtime. , Disp: , Rfl:  .  insulin regular (NOVOLIN R) 100 units/mL injection, INJECT 50 UNITS SUBCTANEOUSLY 3 TIMES A DAY BEFORE MEALS, PLUS SLIDING SCALE. MDD 100 UNITS, Disp: , Rfl:  .  Insulin Syringe-Needle U-100 31G X 15/64" 1 ML MISC, ONE  THREE TIMES DAILY, Disp: , Rfl:  .  irbesartan-hydrochlorothiazide (AVALIDE) 300-12.5 MG tablet, Take by mouth., Disp: , Rfl:  .  Lancets (ACCU-CHEK SOFT TOUCH) lancets, Use 1 each 4 (four) times daily, Disp: , Rfl:  .  levothyroxine (SYNTHROID, LEVOTHROID) 25 MCG tablet, TAKE 1 TABLET EVERY DAY  EXCEPT TAKE 2 TABLETS  ON  SUNDAYS, Disp: 100 tablet, Rfl: 0 .  metFORMIN (GLUCOPHAGE-XR) 500 MG 24 hr tablet, Take 500 mg by mouth. 2 tablets daily (total of 1000 mg daily), Disp: , Rfl:  .  metoprolol tartrate (LOPRESSOR) 25 MG tablet, Take 1 tablet (25 mg total) by mouth 2 (two) times daily., Disp: 180  tablet, Rfl: 1 .  Misc Natural Products (OSTEO BI-FLEX ADV JOINT SHIELD PO), Take by mouth 2 (two) times a week., Disp: , Rfl:  .  mometasone-formoterol (DULERA) 100-5 MCG/ACT AERO, Inhale 2 puffs into the lungs 2 (two) times daily., Disp: 3 Inhaler, Rfl: 1 .  montelukast (SINGULAIR) 10 MG tablet, Take 1 tablet (10 mg total) by mouth at bedtime., Disp: 90 tablet, Rfl: 1 .  omeprazole (PRILOSEC) 40 MG capsule, Take 1 capsule (40 mg total) by mouth daily., Disp: 90 capsule, Rfl: 1 .  Potassium 99 MG TABS, Take by mouth., Disp: , Rfl:  .  pregabalin (LYRICA) 300 MG capsule, Take 2 capsules (600 mg total) by mouth daily., Disp: 180 capsule, Rfl: 1 .  ranitidine (ZANTAC) 150 MG tablet, Take 1 tablet (150 mg total) by  mouth at bedtime., Disp: 90 tablet, Rfl: 0 .  RELION INSULIN SYRINGE 1ML/31G 31G X 5/16" 1 ML MISC, , Disp: , Rfl:  .  rosuvastatin (CRESTOR) 20 MG tablet, Take 1 tablet (20 mg total) by mouth daily., Disp: 90 tablet, Rfl: 1 .  vitamin C (ASCORBIC ACID) 500 MG tablet, Take 500 mg by mouth 2 (two) times daily. , Disp: , Rfl:  .  VITAMIN D, CHOLECALCIFEROL, PO, Take 2,000 Units by mouth daily. , Disp: , Rfl:   Allergies  Allergen Reactions  . Codeine Other (See Comments)    "TRIPPED OUT"  DIDN'T LIKE THE [place]  . Atorvastatin     muscle pain  . Latex Rash  . Zolpidem Other (See Comments)    Sleep walk    ROS  Constitutional: Negative for fever or Positive- 7 pound weight gain since Monday.  Respiratory: Negative for cough Endorses chronic shortness of breath.   Cardiovascular: Negative for chest pain or palpitations.  Gastrointestinal: Negative for abdominal pain-sts resolved on monday, no bowel changes.  Musculoskeletal: Positive for chronic gait problem or joint swelling.  Skin: Negative for rash.  Neurological: Negative for dizziness or headache.  No other specific complaints in a complete review of systems (except as listed in HPI above).  Objective  Vitals:    01/30/18 1138  BP: 136/74  Pulse: 100  Resp: 16  Temp: 98.1 F (36.7 C)  TempSrc: Oral  SpO2: 100%  Weight: 254 lb 6.4 oz (115.4 kg)  Height: '5\' 2"'$  (1.575 m)    Body mass index is 46.53 kg/m.  Nursing Note and Vital Signs reviewed.  Physical Exam   Constitutional: Patient appears well-developed and well-nourished. Obese  No distress.  Cardiovascular: Normal rate, regular rhythm, S1/S2 present.  No murmur or rub heard.  Pulmonary/Chest: Effort normal and breath sounds clear. No respiratory distress or retractions. Abdominal: Soft and non-tender, bowel sounds present-hypoactive, no CVA tenderness, negative murphys- obese abdomen difficult to deep palpate.  Psychiatric: Patient has a normal mood and affect. behavior is normal. Judgment and thought content normal.   Assessment & Plan  1. Right flank pain - discussed OTC pain relief and hydration, discussed ER precautions if unimproved or worsening, develops vomiting, fevers or blood in stools. Follow up in one week  - US Abdomen Complete; Future - CBC - COMPLETE METABOLIC PANEL WITH GFR - POCT Urinalysis Dipstick  2. Mid back pain on right side - discussed hydration, ice/heat stretching, tylenol and muscle relaxer, limit NSAIDs to last resort for pain relief.  - tiZANidine (ZANAFLEX) 4 MG tablet; Take 1 tablet (4 mg total) by mouth every 6 (six) hours as needed for muscle spasms.  Dispense: 30 tablet; Refill: 0   Pt presents with right flank/back pain due to increased body habitus and increased risk factors for gall bladder and kidney stones will do [place] to r/o stones. D Face-to-face time with patient was more than 25 minutes, >50% time spent counseling and coordination of care  -Red flags and when to present for emergency care or RTC including fever >101.31F, chest pain, shortness of breath, new/worsening/un-resolving symptoms,  reviewed with patient at time of visit. Follow up and care instructions discussed and provided in  AVS. ---------------------------------------- I have reviewed this encounter including the documentation in this note and/or discussed this patient with the provider, Suezanne Cheshire DNP AGNP-C. I am certifying that I agree with the content of this note as supervising physician. Enid Derry, MD [facility] 01/31/2018, 5:55 PM

## 2018-01-30 NOTE — Patient Outreach (Signed)
[facility]) Care Management  01/30/2018  ABBIGAYLE TOOLE 02-09-1951 [id number]   Telephone follow up call  Unsuccessful outreach call to patient, no answer, able to leave a HIPAA approved voice mail message.    Plan  Will await return call.  3rd unsuccessful call, unsuccessful outreach letter sent. Will plan return call in the next week and if no response will plan case closure.    Joylene Draft, RN, [place] Management Coordinator  [phone]- Mobile [phone]- Toll Free Main Office

## 2018-01-30 NOTE — Patient Instructions (Addendum)
- Continue to alternate between ice and heat  - Take tylenol 500-650 mg every 6 hours - If you need additional pain relief take ibuprofen or naproxen but please follow the bottles instructions- use these less than muscle relaxer and tylenol as it can be hard on your kidneys. Please drink plenty of water - Muscle relaxer as needed - If your pain becomes to severe, if you have vomiting, fevers or bloody stools or anything else your really concerned about please go to the ER - follow up in one week  Back Exercises If you have pain in your back, do these exercises 2-3 times each day or as told by your doctor. When the pain goes away, do the exercises once each day, but repeat the steps more times for each exercise (do more repetitions). If you do not have pain in your back, do these exercises once each day or as told by your doctor. Exercises Single Knee to Chest  Do these steps 3-5 times in a row for each leg: 1. Lie on your back on a firm bed or the floor with your legs stretched out. 2. Bring one knee to your chest. 3. Hold your knee to your chest by grabbing your knee or thigh. 4. Pull on your knee until you feel a gentle stretch in your lower back. 5. Keep doing the stretch for 10-30 seconds. 6. Slowly let go of your leg and straighten it.  Pelvic Tilt  Do these steps 5-10 times in a row: 1. Lie on your back on a firm bed or the floor with your legs stretched out. 2. Bend your knees so they point up to the ceiling. Your feet should be flat on the floor. 3. Tighten your lower belly (abdomen) muscles to press your lower back against the floor. This will make your tailbone point up to the ceiling instead of pointing down to your feet or the floor. 4. Stay in this position for 5-10 seconds while you gently tighten your muscles and breathe evenly.  Cat-Cow  Do these steps until your lower back bends more easily: 1. Get on your hands and knees on a firm surface. Keep your hands under your  shoulders, and keep your knees under your hips. You may put padding under your knees. 2. Let your head hang down, and make your tailbone point down to the floor so your lower back is round like the back of a cat. 3. Stay in this position for 5 seconds. 4. Slowly lift your head and make your tailbone point up to the ceiling so your back hangs low (sags) like the back of a cow. 5. Stay in this position for 5 seconds.  Press-Ups  Do these steps 5-10 times in a row: 1. Lie on your belly (face-down) on the floor. 2. Place your hands near your head, about shoulder-width apart. 3. While you keep your back relaxed and keep your hips on the floor, slowly straighten your arms to raise the top half of your body and lift your shoulders. Do not use your back muscles. To make yourself more comfortable, you may change where you place your hands. 4. Stay in this position for 5 seconds. 5. Slowly return to lying flat on the floor.  Bridges  Do these steps 10 times in a row: 1. Lie on your back on a firm surface. 2. Bend your knees so they point up to the ceiling. Your feet should be flat on the floor. 3. Tighten your butt  muscles and lift your butt off of the floor until your waist is almost as high as your knees. If you do not feel the muscles working in your butt and the back of your thighs, slide your feet 1-2 inches farther away from your butt. 4. Stay in this position for 3-5 seconds. 5. Slowly lower your butt to the floor, and let your butt muscles relax.  If this exercise is too easy, try doing it with your arms crossed over your chest. Belly Crunches  Do these steps 5-10 times in a row: 1. Lie on your back on a firm bed or the floor with your legs stretched out. 2. Bend your knees so they point up to the ceiling. Your feet should be flat on the floor. 3. Cross your arms over your chest. 4. Tip your chin a little bit toward your chest but do not bend your neck. 5. Tighten your belly muscles and  slowly raise your chest just enough to lift your shoulder blades a tiny bit off of the floor. 6. Slowly lower your chest and your head to the floor.  Back Lifts Do these steps 5-10 times in a row: 1. Lie on your belly (face-down) with your arms at your sides, and rest your forehead on the floor. 2. Tighten the muscles in your legs and your butt. 3. Slowly lift your chest off of the floor while you keep your hips on the floor. Keep the back of your head in line with the curve in your back. Look at the floor while you do this. 4. Stay in this position for 3-5 seconds. 5. Slowly lower your chest and your face to the floor.  Contact a doctor if:  Your back pain gets a lot worse when you do an exercise.  Your back pain does not lessen 2 hours after you exercise. If you have any of these problems, stop doing the exercises. Do not do them again unless your doctor says it is okay. Get help right away if:  You have sudden, very bad back pain. If this happens, stop doing the exercises. Do not do them again unless your doctor says it is okay. This information is not intended to replace advice given to you by your health care provider. Make sure you discuss any questions you have with your health care provider. Document Released: 09/30/2010 Document Revised: 02/03/2016 Document Reviewed: 10/22/2014 Elsevier Interactive Patient Education  [employer].

## 2018-01-31 ENCOUNTER — Telehealth: Payer: Self-pay

## 2018-01-31 ENCOUNTER — Other Ambulatory Visit: Payer: Self-pay | Admitting: Pharmacist

## 2018-01-31 NOTE — Telephone Encounter (Signed)
One po qpm

## 2018-01-31 NOTE — Patient Outreach (Signed)
[facility]) Care Management  01/31/2018  [name] [birth year] [id number]  Communication received from [place] regarding successful PAP applications.    Call placed to [name] who confirms that she received and is using Dulera and Proventil inhalers.  She voiced understanding on how to request refills and submitting new application each year.  She has my phone number if she should need to reach out to me in the future.    Plan: I will close [facility] pharmacy case at this time.  Case closure letter will be sent to provider and any active [facility] care team members will be updated as appropriate.    Ralene Bathe, PharmD, [place] [phone]

## 2018-01-31 NOTE — Telephone Encounter (Signed)
[facility] notified to take one at bedtime with her Ranitidine.

## 2018-01-31 NOTE — Telephone Encounter (Signed)
Copied from [place] [id number]. Topic: General - Other >> Jan 30, 2018  5:31 PM Yvette Rack wrote: Reason for CRM: Joneen Caraway with [employer] requested a call back for clarification on Rx for ranitidine (ZANTAC) 150 MG tablet. Cb# [phone]

## 2018-02-01 ENCOUNTER — Other Ambulatory Visit: Payer: Self-pay | Admitting: Family Medicine

## 2018-02-01 DIAGNOSIS — F331 Major depressive disorder, recurrent, moderate: Secondary | ICD-10-CM

## 2018-02-01 NOTE — Telephone Encounter (Signed)
Refill request for general medication: Cymbalta 30 mg  Last office visit: 01/30/2018  Last physical exam: None indicated  Follow-ups on file. 02/06/2018

## 2018-02-06 ENCOUNTER — Ambulatory Visit (INDEPENDENT_AMBULATORY_CARE_PROVIDER_SITE_OTHER): Payer: Medicare HMO | Admitting: Nurse Practitioner

## 2018-02-06 ENCOUNTER — Other Ambulatory Visit: Payer: Self-pay | Admitting: *Deleted

## 2018-02-06 ENCOUNTER — Encounter: Payer: Self-pay | Admitting: Nurse Practitioner

## 2018-02-06 ENCOUNTER — Ambulatory Visit: Payer: Medicare HMO | Admitting: Orthotics

## 2018-02-06 ENCOUNTER — Encounter: Payer: Self-pay | Admitting: *Deleted

## 2018-02-06 VITALS — BP 126/84 | HR 100 | Temp 98.0°F | Resp 18 | Ht 62.0 in | Wt 256.9 lb

## 2018-02-06 DIAGNOSIS — T148XXA Other injury of unspecified body region, initial encounter: Secondary | ICD-10-CM | POA: Diagnosis not present

## 2018-02-06 DIAGNOSIS — Z6841 Body Mass Index (BMI) 40.0 and over, adult: Secondary | ICD-10-CM | POA: Diagnosis not present

## 2018-02-06 NOTE — Patient Instructions (Addendum)
-   Patient continue drinking lots of water- goal is a minimum of 64 ounces a day - Encourage 5-7 serving of fruits and veggies (stawberries and blueberries have lower carb)   Muscle Strain A muscle strain (pulled muscle) happens when a muscle is stretched beyond normal length. It happens when a sudden, violent force stretches your muscle too far. Usually, a few of the fibers in your muscle are torn. Muscle strain is common in athletes. Recovery usually takes 1-2 weeks. Complete healing takes 5-6 weeks. Follow these instructions at home:  Follow the PRICE method of treatment to help your injury get better. Do this the first 2-3 days after the injury: ? Protect. Protect the muscle to keep it from getting injured again. ? Rest. Limit your activity and rest the injured body part. ? Ice. Put ice in a plastic bag. Place a towel between your skin and the bag. Then, apply the ice and leave it on from 15-20 minutes each hour. After the third day, switch to moist heat packs. ? Compression. Use a splint or elastic bandage on the injured area for comfort. Do not put it on too tightly. ? Elevate. Keep the injured body part above the level of your heart.  Only take medicine as told by your doctor.  Warm up before doing exercise to prevent future muscle strains. Contact a doctor if:  You have more pain or puffiness (swelling) in the injured area.  You feel numbness, tingling, or notice a loss of strength in the injured area. This information is not intended to replace advice given to you by your health care provider. Make sure you discuss any questions you have with your health care provider. Document Released: 06/06/2008 Document Revised: 02/03/2016 Document Reviewed: 03/27/2013 Elsevier Interactive Patient Education  2017 [employer].

## 2018-02-06 NOTE — Patient Outreach (Signed)
[facility]) Care Management  02/06/2018  Summer [name] Jun 25, 1951 [id number]   Case Closure  In reviewing patient recent contacts, 3 unsuccessful telephone outreaches in greater than 10 days , No response from outreach letter sent.   Plan  Will close case per workflow unsuccessful outreach attempts, unable to maintain contact.  Will send case closure letter to patient  Will send PCP case closure letter.    Joylene Draft, RN, [place] Management Coordinator  [phone]- Mobile [phone]- Toll Free Main Office

## 2018-02-06 NOTE — Progress Notes (Addendum)
Name: Summer [name]   MRN: [id number]    DOB: [birth year]   Date:02/06/2018       Progress Note  Subjective  Chief Complaint  Chief Complaint  Patient presents with  . Follow-up    1 week recheck    HPI  Patient is here for one week follow-up of right side pain- Patient states has been taking muscle relaxer, resting, took some ibuprofen earlier and coughing had eased up and pain has significantly improved. Patient states still a dull ache every once in awhile. Patient endorses full ROM with back now as well.  Denies paresthesias, nausea, vomiting, dysuria, not worse with eating Patient Active Problem List   Diagnosis Date Noted  . (HFpEF) heart failure with preserved ejection fraction ([place]) 08/31/2017  . Moderate persistent asthma 08/31/2017  . Hyperlipidemia due to type 2 diabetes mellitus ([place]) 06/14/2017  . Chest pain of unknown etiology 10/06/2016  . Essential hypertension 10/06/2016  . Morbid obesity with BMI of 40.0-44.9, adult ([place]) 10/06/2016  . Dyslipidemia associated with type 2 diabetes mellitus ([place]) 08/18/2016  . Charcot foot due to diabetes mellitus ([place]) 02/22/2016  . Hypertriglyceridemia 07/27/2015  . Acquired abduction deformity of foot 07/12/2015  . Osteoarthritis of subtalar joint 07/12/2015  . Poorly controlled type 2 diabetes mellitus with neuropathy ([place]) 07/12/2015  . Arthritis of foot, degenerative 07/12/2015  . Carpal tunnel syndrome 04/17/2015  . Chronic constipation 04/17/2015  . Insomnia, persistent 04/17/2015  . Chronic kidney disease (CKD), stage III (moderate) ([place]) 04/17/2015  . Decreased exercise tolerance 04/17/2015  . Diabetes mellitus with polyneuropathy ([place]) 04/17/2015  . Gastro-esophageal reflux disease without esophagitis 04/17/2015  . Bursitis, trochanteric 04/17/2015  . Cephalalgia 04/17/2015  . Benign hypertension 04/17/2015  . Adult hypothyroidism 04/17/2015  . Hearing loss 04/17/2015  . Chronic recurrent major depressive disorder  ([place]) 04/17/2015  . Neurogenic claudication 04/17/2015  . Obesity (BMI 35.0-39.9 without comorbidity) 04/17/2015  . Hypo-ovarianism 04/17/2015  . Perennial allergic rhinitis with seasonal variation 04/17/2015  . Acne erythematosa 04/17/2015  . Dyskinesia, tardive 04/17/2015  . Memory loss 04/17/2015  . Impingement syndrome of shoulder 04/17/2015  . Dermatitis, stasis 04/17/2015  . Obstructive sleep apnea 05/14/2014  . Dyspnea 05/06/2014  . Hyperlipidemia 02/06/2012  . LBP (low back pain) 09/16/2008    Past Medical History:  Diagnosis Date  . Anemia   . Arthritis   . Asthma   . Carpal tunnel syndrome   . Chronic kidney disease   . Depressive disorder   . Diabetes mellitus   . Dyspnea   . GERD (gastroesophageal reflux disease)   . Headache   . History of hiatal hernia   . Hyperlipidemia   . Hypertension   . Insomnia   . Lumbago   . Memory loss   . Obesity   . 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)   . Pneumonia   . Rhinitis, allergic   . Rosacea   . Thyroid disease   . Unspecified hearing loss   . Unspecified hereditary and idiopathic peripheral neuropathy   . Unspecified sleep apnea     Past Surgical History:  Procedure Laterality Date  . ANKLE SURGERY Left approx Jan 2018  . CATARACT EXTRACTION  01/2011   right  . eye lid surgery  2013   bilateral  . FOOT SURGERY    . NECK SURGERY    . TUBAL LIGATION    . VAGINAL HYSTERECTOMY  1989    Social History   Tobacco Use  . Smoking status: Never Smoker  .  Smokeless tobacco: Never Used  Substance Use Topics  . Alcohol use: No    Alcohol/week: 0.0 - 0.6 oz    Frequency: Never     Current Outpatient Medications:  .  ACCU-CHEK SOFTCLIX LANCETS lancets, , Disp: , Rfl:  .  acetaminophen (TYLENOL 8 HOUR) 650 MG CR tablet, Take 1 tablet (650 mg total) by mouth every 8 (eight) hours as needed for pain., Disp: 90 tablet, Rfl: 0 .  albuterol (PROVENTIL HFA) 108 (90 Base) MCG/ACT inhaler, Inhale 2 puffs into the lungs 2  (two) times daily as needed for shortness of breath. , Disp: , Rfl:  .  Alcohol Swabs (B-D SINGLE USE SWABS REGULAR) PADS, , Disp: , Rfl:  .  ASPIRIN LOW DOSE 81 MG EC tablet, TAKE 1 TABLET (81 MG TOTAL) BY MOUTH DAILY., Disp: 30 tablet, Rfl: 0 .  Blood Glucose Monitoring Suppl (ACCU-CHEK AVIVA PLUS) w/Device KIT, , Disp: , Rfl:  .  DULoxetine (CYMBALTA) 30 MG capsule, Take 1 capsule (30 mg total) by mouth daily., Disp: 30 capsule, Rfl: 0 .  fluticasone-salmeterol (ADVAIR HFA) 009-38 MCG/ACT inhaler, Inhale into the lungs. , Disp: , Rfl:  .  furosemide (LASIX) 20 MG tablet, Take 1 tablet (20 mg total) by mouth daily., Disp: 30 tablet, Rfl: 2 .  Glucose Blood (BLOOD GLUCOSE TEST STRIPS) STRP, , Disp: , Rfl:  .  insulin glargine (LANTUS) 100 UNIT/ML injection, Inject 80 Units into the skin at bedtime. , Disp: , Rfl:  .  insulin regular (NOVOLIN R) 100 units/mL injection, INJECT 50 UNITS SUBCTANEOUSLY 3 TIMES A DAY BEFORE MEALS, PLUS SLIDING SCALE. MDD 100 UNITS, Disp: , Rfl:  .  Insulin Syringe-Needle U-100 31G X 15/64" 1 ML MISC, ONE  THREE TIMES DAILY, Disp: , Rfl:  .  irbesartan-hydrochlorothiazide (AVALIDE) 300-12.5 MG tablet, Take by mouth., Disp: , Rfl:  .  Lancets (ACCU-CHEK SOFT TOUCH) lancets, Use 1 each 4 (four) times daily, Disp: , Rfl:  .  levothyroxine (SYNTHROID, LEVOTHROID) 25 MCG tablet, TAKE 1 TABLET EVERY DAY  EXCEPT TAKE 2 TABLETS  ON  SUNDAYS, Disp: 100 tablet, Rfl: 0 .  metFORMIN (GLUCOPHAGE-XR) 500 MG 24 hr tablet, Take 500 mg by mouth. 2 tablets daily (total of 1000 mg daily), Disp: , Rfl:  .  metoprolol tartrate (LOPRESSOR) 25 MG tablet, Take 1 tablet (25 mg total) by mouth 2 (two) times daily., Disp: 180 tablet, Rfl: 1 .  Misc Natural Products (OSTEO BI-FLEX ADV JOINT SHIELD PO), Take by mouth 2 (two) times a week., Disp: , Rfl:  .  mometasone-formoterol (DULERA) 100-5 MCG/ACT AERO, Inhale 2 puffs into the lungs 2 (two) times daily., Disp: 3 Inhaler, Rfl: 1 .  montelukast  (SINGULAIR) 10 MG tablet, Take 1 tablet (10 mg total) by mouth at bedtime., Disp: 90 tablet, Rfl: 1 .  omeprazole (PRILOSEC) 40 MG capsule, Take 1 capsule (40 mg total) by mouth daily., Disp: 90 capsule, Rfl: 1 .  Potassium 99 MG TABS, Take by mouth., Disp: , Rfl:  .  pregabalin (LYRICA) 300 MG capsule, Take 2 capsules (600 mg total) by mouth daily., Disp: 180 capsule, Rfl: 1 .  ranitidine (ZANTAC) 150 MG tablet, Take 1 tablet (150 mg total) by mouth at bedtime., Disp: 90 tablet, Rfl: 0 .  RELION INSULIN SYRINGE 1ML/31G 31G X 5/16" 1 ML MISC, , Disp: , Rfl:  .  rosuvastatin (CRESTOR) 20 MG tablet, Take 1 tablet (20 mg total) by mouth daily., Disp: 90 tablet, Rfl: 1 .  tiZANidine (ZANAFLEX) 4 MG tablet, Take 1 tablet (4 mg total) by mouth every 6 (six) hours as needed for muscle spasms., Disp: 30 tablet, Rfl: 0 .  vitamin C (ASCORBIC ACID) 500 MG tablet, Take 500 mg by mouth 2 (two) times daily. , Disp: , Rfl:  .  VITAMIN D, CHOLECALCIFEROL, PO, Take 2,000 Units by mouth daily. , Disp: , Rfl:   Allergies  Allergen Reactions  . Codeine Other (See Comments)    "TRIPPED OUT"  DIDN'T LIKE THE [place]  . Atorvastatin     muscle pain  . Latex Rash  . Zolpidem Other (See Comments)    Sleep walk    ROS    No other specific complaints in a complete review of systems (except as listed in HPI above).  Objective  Vitals:   02/06/18 1423  BP: 126/84  Pulse: 100  Resp: 18  Temp: 98 F (36.7 C)  TempSrc: Oral  SpO2: 98%  Weight: 256 lb 14.4 oz (116.5 kg)  Height: _0  (1.575 m)    Body mass index is 46.99 kg/m.  Nursing Note and Vital Signs reviewed.  Physical Exam  Constitutional: Patient appears well-developed and well-nourished. Obese  No distress.  Cardiovascular: Normal rate, regular rhythm, S1/S2 present.  No murmur or rub heard.  Pulmonary/Chest: Effort normal and breath sounds clear. No respiratory distress or retractions. Abdominal: Soft and non-tender, bowel sounds  present, no CVA tenderness  Psychiatric: Patient has a normal mood and affect. behavior is normal. Judgment and thought content normal.  No results found for this or any previous visit (from the past 72 hour(s)).  Assessment & Plan  1. Muscle strain Improving, continue ice/heat, muscle relaxer only as needed and back stretching.  Please return with new or worsening symptoms.   2. Morbid (severe) obesity due to excess calories ([name]) -drinkplenty of water, incorporate more fruits and vegetables in lifestyle, decrease carb, increase fiber  3. BMI 45.0-49.9, adult [facility] Discussed diet  -Reviewed Health Maintenance: states is up to date will request records from [facility]   ------------------------------------ I have reviewed this encounter including the documentation in this note and/or discussed this patient with the provider, Suezanne Cheshire DNP AGNP-C. I am certifying that I agree with the content of this note as supervising physician. Enid Derry, [facility] 02/06/2018, 3:25 PM

## 2018-02-07 ENCOUNTER — Encounter: Payer: Self-pay | Admitting: Dietician

## 2018-02-07 ENCOUNTER — Encounter: Payer: Medicare HMO | Attending: Family Medicine | Admitting: Dietician

## 2018-02-07 VITALS — Ht 62.0 in | Wt 249.5 lb

## 2018-02-07 DIAGNOSIS — Z794 Long term (current) use of insulin: Secondary | ICD-10-CM

## 2018-02-07 DIAGNOSIS — E1142 Type 2 diabetes mellitus with diabetic polyneuropathy: Secondary | ICD-10-CM | POA: Insufficient documentation

## 2018-02-07 DIAGNOSIS — E1140 Type 2 diabetes mellitus with diabetic neuropathy, unspecified: Secondary | ICD-10-CM

## 2018-02-07 DIAGNOSIS — Z713 Dietary counseling and surveillance: Secondary | ICD-10-CM | POA: Diagnosis not present

## 2018-02-07 NOTE — Progress Notes (Signed)
Medical Nutrition Therapy: Visit start time: 1330  end time: 1430  Assessment:  Diagnosis: Type 2 Diabetes with neuropathy Past medical history: HTN, HLD, sleep apnea, GERD Psychosocial issues/ stress concerns: recent depression, now improving with medication Preferred learning method:  . Auditory . Hands-on  Current weight: 249.5lbs Height: 5'2" Medications, supplements: reconciled list in medical record  Progress and evaluation: Patient reports little attention to diet and BGs over past several months, likely due to depression, and has had weight gain and increased BGs. She has now started on an antidepressant and reports improved BGs, sleep, and mood. She has reduced her intake of sweets and carbohydrate foods. She does report some weight fluctuation due to fluid retention. Last DM education was 10+ years ago.  Physical activity: gym rowing machine 45 minutes, 1-3 times a week  Dietary Intake:  Usual eating pattern includes 2-3 meals and 1-2 snacks per day. Dining out frequency: 7 meals per week.  Breakfast: sometimes skips; 2-3 eggs with spam; was eating cheerios Snack: popcorn or cheez-it crackers Lunch: usually skips if not hungry due to snacking; sometimes takeout sandwich Snack: small bag popcorn or cheez-it crackers Supper: usually takeout; partner cooks when able at home I.e. Salad with chicken Snack: sometimes bologna and cheese during the night  Beverages: water-- soda stream with flavoring; diet sierra mist or diet Mt Dew.   Nutrition Care Education: Topics covered: diabetes Basic nutrition: basic food groups, appropriate nutrient balance, appropriate meal and snack schedule, general nutrition guidelines    Weight control: benefits of weight control Advanced nutrition: food label reading for carbohydrates and sugar, sugar alcohols Diabetes: appropriate meal and snack schedule, appropriate carb intake and balance, basic meal planning for diabetes and weight loss using  plate method, discussed appropriate food portions, role of exercise and stress on BG control and appetite.  Hypertension: identifying high sodium foods  Nutritional Diagnosis:  [place]-2.2 Altered nutrition-related laboratory As related to diabetes.  As evidenced by recent HbA1C of 9.6%. [place]-3.3 Overweight/obesity As related to history of excess calories and inactivity due to knee pain.  As evidenced by patient with BMI of 45.6.  Intervention: Instruction as noted above.   Patient voices understanding of label reading and meal planning.   Set goals with direction from patient.   Patient declined scheduling RD follow-up but will schedule later if needed.  Education Materials given:  . General diet guidelines for Diabetes . Plate Planner with food lists . Sample menus . Goals/ instructions   Learner/ who was taught:  . Patient   Level of understanding: Marland Kitchen Verbalizes/ demonstrates competency  Demonstrated degree of understanding via:   Teach back Learning barriers: . None  Willingness to learn/ readiness for change: . Eager, change in progress  Monitoring and Evaluation:  Dietary intake, exercise, BG control, and body weight      follow up: prn

## 2018-02-07 NOTE — Patient Instructions (Addendum)
   When reading food labels, check the serving size, then the Total Carbohydrate grams. This total includes all the indented items below it, such as sugars, fiber, sugar alcohols, etc.   Aim for 30 grams of carbohydrate with each meal, but not more than 45grams.   OK to have Glucerna, or Premier Protein or other protein drink in place of a meal if you are not hungry. If the drink is less than 15grams of carbohydrate, add a fruit or graham cracker (1-2) with it for some healthy carbs.

## 2018-02-10 ENCOUNTER — Other Ambulatory Visit: Payer: Self-pay | Admitting: Family Medicine

## 2018-03-12 ENCOUNTER — Other Ambulatory Visit: Payer: Self-pay | Admitting: Family Medicine

## 2018-03-12 NOTE — Telephone Encounter (Signed)
Refill request for general medication: Lasix 20 mg  Last office visit: 01/28/2018  Last physical exam: 03/10/2016--Medicare  Follow-ups on file. 05/01/2018

## 2018-03-19 ENCOUNTER — Other Ambulatory Visit: Payer: Self-pay | Admitting: Family Medicine

## 2018-03-19 DIAGNOSIS — F331 Major depressive disorder, recurrent, moderate: Secondary | ICD-10-CM

## 2018-03-19 NOTE — Telephone Encounter (Signed)
Copied from [place] [id number]. Topic: Quick Communication - Rx Refill/Question >> Mar 19, 2018 12:22 PM Cecelia Byars, NT wrote: Medication:  DULoxetine (CYMBALTA) 30 MG capsule   Has the patient contacted their pharmacy? yes  (Agent: If no, request that the patient contact the pharmacy for the refill. (Agent: If yes, when and what did the pharmacy advise?  Preferred Pharmacy (with phone number or street name [facility] [address] [place] [phone] (Phone) [phone] (Fax      Agent: Please be advised that RX refills may take up to 3 business days. We ask that you follow-up with your pharmacy.

## 2018-03-20 NOTE — Telephone Encounter (Signed)
Refill request was sent to Dr. Krichna Sowles for approval and submission.  

## 2018-03-28 DIAGNOSIS — S0502XA Injury of conjunctiva and corneal abrasion without foreign body, left eye, initial encounter: Secondary | ICD-10-CM | POA: Diagnosis not present

## 2018-03-29 DIAGNOSIS — S0502XD Injury of conjunctiva and corneal abrasion without foreign body, left eye, subsequent encounter: Secondary | ICD-10-CM | POA: Diagnosis not present

## 2018-04-05 ENCOUNTER — Ambulatory Visit: Payer: Medicare HMO | Admitting: Podiatry

## 2018-04-05 ENCOUNTER — Encounter: Payer: Self-pay | Admitting: Podiatry

## 2018-04-05 DIAGNOSIS — M21961 Unspecified acquired deformity of right lower leg: Secondary | ICD-10-CM

## 2018-04-05 DIAGNOSIS — E1142 Type 2 diabetes mellitus with diabetic polyneuropathy: Secondary | ICD-10-CM | POA: Diagnosis not present

## 2018-04-05 DIAGNOSIS — M19072 Primary osteoarthritis, left ankle and foot: Secondary | ICD-10-CM

## 2018-04-05 DIAGNOSIS — Z872 Personal history of diseases of the skin and subcutaneous tissue: Secondary | ICD-10-CM | POA: Diagnosis not present

## 2018-04-05 DIAGNOSIS — E11610 Type 2 diabetes mellitus with diabetic neuropathic arthropathy: Secondary | ICD-10-CM

## 2018-04-07 NOTE — Progress Notes (Signed)
   HPI: Patient presents today as referral from Dr. Prudence Davidson for evaluation of Charcot foot.  Patient explains that she would like to have a prescription for a electric wheelchair which would be considered medically necessary.  Patient has a history of Charcot reconstructive foot surgery to the left lower extremity.  She also has a history of falls.  She feels very unsteady with gait and experiences pain during ambulation.  Past Medical History:  Diagnosis Date  . Anemia   . Arthritis   . Asthma   . Carpal tunnel syndrome   . Chronic kidney disease   . Depressive disorder   . Diabetes mellitus   . Dyspnea   . GERD (gastroesophageal reflux disease)   . Headache   . History of hiatal hernia   . Hyperlipidemia   . Hypertension   . Insomnia   . Lumbago   . Memory loss   . Obesity   . 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)   . Pneumonia   . Rhinitis, allergic   . Rosacea   . Thyroid disease   . Unspecified hearing loss   . Unspecified hereditary and idiopathic peripheral neuropathy   . Unspecified sleep apnea      Physical Exam: General: The patient is alert and oriented x3 in no acute distress.  Dermatology: Skin is warm, dry and supple bilateral lower extremities. Negative for open lesions or macerations.  Vascular: Palpable pedal pulses bilaterally. No edema or erythema noted. Capillary refill within normal limits.  Neurological: Epicritic and protective threshold diminished bilaterally.   Musculoskeletal Exam: Range of motion limited to all pedal and ankle joints of the bilateral feet.  History of Charcot reconstructive surgery left lower extremity.  Foot appears stable without any prominent pressure points.  Rigid Charcot foot type left  Assessment: 1.  Charcot foot deformity left lower extremity with surgical stabilization 2.  History of falls 3.  Inability to ambulate within her home safely 4.  Inability to utilize a manual wheelchair   Plan of Care:  1. Patient evaluated.    2.  Prescription for an electric wheelchair was provided for the patient 3.  This is considered medically necessary.  It is difficult for the patient to safely ambulate within her home and she has difficulty using a manual wheelchair.  She is also unable to perform her daily tasks within her home. 4.  Return to clinic as needed      Edrick Kins, DPM [facility]  Dr. Edrick Kins, DPM    [address], [place] [address]                Office [phone]  Fax [phone]

## 2018-04-10 ENCOUNTER — Other Ambulatory Visit: Payer: Self-pay | Admitting: Family Medicine

## 2018-04-10 DIAGNOSIS — K219 Gastro-esophageal reflux disease without esophagitis: Secondary | ICD-10-CM

## 2018-04-10 NOTE — Telephone Encounter (Signed)
Refill request for general medication. Ranitidine   Last office visit 01/28/2018   Follow up on 05/01/18

## 2018-05-01 ENCOUNTER — Encounter: Payer: Self-pay | Admitting: Family Medicine

## 2018-05-01 ENCOUNTER — Ambulatory Visit (INDEPENDENT_AMBULATORY_CARE_PROVIDER_SITE_OTHER): Payer: Medicare HMO | Admitting: Family Medicine

## 2018-05-01 VITALS — BP 116/70 | HR 101 | Temp 97.4°F | Resp 16 | Ht 62.0 in | Wt 251.4 lb

## 2018-05-01 DIAGNOSIS — E1140 Type 2 diabetes mellitus with diabetic neuropathy, unspecified: Secondary | ICD-10-CM

## 2018-05-01 DIAGNOSIS — E039 Hypothyroidism, unspecified: Secondary | ICD-10-CM

## 2018-05-01 DIAGNOSIS — E1169 Type 2 diabetes mellitus with other specified complication: Secondary | ICD-10-CM

## 2018-05-01 DIAGNOSIS — E785 Hyperlipidemia, unspecified: Secondary | ICD-10-CM

## 2018-05-01 DIAGNOSIS — N183 Chronic kidney disease, stage 3 unspecified: Secondary | ICD-10-CM

## 2018-05-01 DIAGNOSIS — K219 Gastro-esophageal reflux disease without esophagitis: Secondary | ICD-10-CM | POA: Diagnosis not present

## 2018-05-01 DIAGNOSIS — F331 Major depressive disorder, recurrent, moderate: Secondary | ICD-10-CM | POA: Diagnosis not present

## 2018-05-01 DIAGNOSIS — I10 Essential (primary) hypertension: Secondary | ICD-10-CM

## 2018-05-01 DIAGNOSIS — R600 Localized edema: Secondary | ICD-10-CM | POA: Diagnosis not present

## 2018-05-01 DIAGNOSIS — J4541 Moderate persistent asthma with (acute) exacerbation: Secondary | ICD-10-CM

## 2018-05-01 DIAGNOSIS — Z79899 Other long term (current) drug therapy: Secondary | ICD-10-CM

## 2018-05-01 DIAGNOSIS — E1165 Type 2 diabetes mellitus with hyperglycemia: Secondary | ICD-10-CM | POA: Diagnosis not present

## 2018-05-01 DIAGNOSIS — E1142 Type 2 diabetes mellitus with diabetic polyneuropathy: Secondary | ICD-10-CM

## 2018-05-01 DIAGNOSIS — G4733 Obstructive sleep apnea (adult) (pediatric): Secondary | ICD-10-CM | POA: Diagnosis not present

## 2018-05-01 DIAGNOSIS — Z6841 Body Mass Index (BMI) 40.0 and over, adult: Secondary | ICD-10-CM

## 2018-05-01 LAB — POCT GLYCOSYLATED HEMOGLOBIN (HGB A1C): Hemoglobin A1C: 9.8 % — AB (ref 4.0–5.6)

## 2018-05-01 MED ORDER — OMEPRAZOLE 40 MG PO CPDR: 40.0000 mg | DELAYED_RELEASE_CAPSULE | Freq: Every day | ORAL | 1 refills | Status: DC

## 2018-05-01 MED ORDER — METOPROLOL TARTRATE 25 MG PO TABS: 25.0000 mg | ORAL_TABLET | Freq: Two times a day (BID) | ORAL | 1 refills | Status: DC

## 2018-05-01 MED ORDER — ROSUVASTATIN CALCIUM 20 MG PO TABS: 20.0000 mg | ORAL_TABLET | Freq: Every day | ORAL | 1 refills | Status: DC

## 2018-05-01 MED ORDER — DULOXETINE HCL 60 MG PO CPEP: 60.0000 mg | ORAL_CAPSULE | Freq: Every day | ORAL | 0 refills | Status: DC

## 2018-05-01 MED ORDER — BENZONATATE 100 MG PO CAPS: 100.0000 mg | ORAL_CAPSULE | Freq: Three times a day (TID) | ORAL | 0 refills | Status: DC | PRN

## 2018-05-01 MED ORDER — MONTELUKAST SODIUM 10 MG PO TABS: 10.0000 mg | ORAL_TABLET | Freq: Every day | ORAL | 1 refills | Status: DC

## 2018-05-01 NOTE — Progress Notes (Signed)
Name: Summer [name]   MRN: [id number]    DOB: 1951-06-30   Date:05/01/2018       Progress Note  Subjective  Chief Complaint  Chief Complaint  Patient presents with  . Follow-up    3 mth f/u  . Diabetes  . Chronic Kidney Disease  . Depression  . Gastroesophageal Reflux  . Cough    patient stated that she has been couging since Saturday. it's productive at times. otc sugar free cough drops    HPI  DMII: she is seeing Dr. Manfred Shirts, she did not remind CMA and hgbA1C was done in our office today. She states not compliant with her diet. She states she has been out of Lantus for the past month, she was getting free but she states she filled out forms two months ago but the company stated forms were not correctly completely. She states glucose has been in the 300-400's since. We will place referral to [facility] and advised case care coordinator with nurse and pharmacist. She has nocturia, eating during the night.   CKI: GFR dropped back in May, due for repeat labs, denies itching, good urine output. Discussed importance of avoiding nsaid's   HTN: doing better, bp is towards lower end of normal, but no dizziness.  No palpitation, her lower extremity edema has improved  Major Depression: still feels down all the time, Phq9 is very high, she went to psychiatrist and also therapist but cannot afford going back because of cost.  She is very depressed still and it is affecting her health since she does not seem to care about getting diabetes at goal. I gave her duloxetine last visit but she lost to follow up and ran out of medication. She states medication seems to make her more calm, not as angry and grouchy.   Abdominal pain: she states having worsening of heartburn, also has epigastric burning sensation and bloating, worse after meals. No fever or chills, no change in bowel movements, she has some gagging with coughing. US gallbladder was ordered by not done  Hyperlipidemia: continue crestor, no muscle  aches, has joint aches from OA  Morbid obesity: high bmi and co-morbidities. She is depressed and does not seem to mind very much, she states difficulty exercising because of knee pain and instability, she was seen by Ortho early this year. She cannot take NSAID's, steroid injections would cause glucose to go up. She has not been taking Tylenol, but advised to resume it and take 650 mg three times daily   Hypothyroidism: taking one daily and two on sundays, no hair loss, no dry skin, no change in bowel movements   Asthma : taking singulair daily, not using inhaler daily, has a cold since Saturday and coughing more often also SOB, we will avoid oral steroids we will give her tessalon pereles and resume inhaler daily   Left arm numbness: she states while in the parking lot she had a coughing spells and noticed left arm numbness. She had SOB but symptoms resolved by itself. Normal exam now, no weakness. Advised to monitor and go to [facility] if recurrence, reviewed heart attack and strokes symptoms    Patient Active Problem List   Diagnosis Date Noted  . (HFpEF) heart failure with preserved ejection fraction ([place]) 08/31/2017  . Moderate persistent asthma 08/31/2017  . Hyperlipidemia due to type 2 diabetes mellitus ([place]) 06/14/2017  . Chest pain of unknown etiology 10/06/2016  . Essential hypertension 10/06/2016  . Morbid obesity with BMI of 40.0-44.9,  adult ([place]) 10/06/2016  . Dyslipidemia associated with type 2 diabetes mellitus ([place]) 08/18/2016  . Charcot foot due to diabetes mellitus ([place]) 02/22/2016  . Hypertriglyceridemia 07/27/2015  . Acquired abduction deformity of foot 07/12/2015  . Osteoarthritis of subtalar joint 07/12/2015  . Poorly controlled type 2 diabetes mellitus with neuropathy ([place]) 07/12/2015  . Arthritis of foot, degenerative 07/12/2015  . Carpal tunnel syndrome 04/17/2015  . Chronic constipation 04/17/2015  . Insomnia, persistent 04/17/2015  . Chronic kidney disease (CKD),  stage III (moderate) ([place]) 04/17/2015  . Decreased exercise tolerance 04/17/2015  . Diabetes mellitus with polyneuropathy ([place]) 04/17/2015  . Gastro-esophageal reflux disease without esophagitis 04/17/2015  . Bursitis, trochanteric 04/17/2015  . Cephalalgia 04/17/2015  . Benign hypertension 04/17/2015  . Adult hypothyroidism 04/17/2015  . Hearing loss 04/17/2015  . Chronic recurrent major depressive disorder ([place]) 04/17/2015  . Neurogenic claudication 04/17/2015  . Obesity (BMI 35.0-39.9 without comorbidity) 04/17/2015  . Hypo-ovarianism 04/17/2015  . Perennial allergic rhinitis with seasonal variation 04/17/2015  . Acne erythematosa 04/17/2015  . Dyskinesia, tardive 04/17/2015  . Memory loss 04/17/2015  . Impingement syndrome of shoulder 04/17/2015  . Dermatitis, stasis 04/17/2015  . Obstructive sleep apnea 05/14/2014  . Dyspnea 05/06/2014  . Hyperlipidemia 02/06/2012  . LBP (low back pain) 09/16/2008    Past Surgical History:  Procedure Laterality Date  . ANKLE SURGERY Left approx Jan 2018  . CATARACT EXTRACTION  01/2011   right  . eye lid surgery  2013   bilateral  . FOOT SURGERY    . NECK SURGERY    . TUBAL LIGATION    . VAGINAL HYSTERECTOMY  1989    Family History  Problem Relation Age of Onset  . Heart attack Mother   . Aneurysm Mother     Social History   Socioeconomic History  . Marital status: Significant Other    Spouse name: Not on file  . Number of children: 1  . Years of education: Not on file  . Highest education level: High school graduate  Occupational History  . Not on file  Social Needs  . Financial resource strain: Not hard at all  . Food insecurity:    Worry: Never true    Inability: Never true  . Transportation needs:    Medical: No    Non-medical: No  Tobacco Use  . Smoking status: Never Smoker  . Smokeless tobacco: Never Used  Substance and Sexual Activity  . Alcohol use: No    Alcohol/week: 0.0 - 1.0 standard drinks     Frequency: Never  . Drug use: No  . Sexual activity: Yes    Partners: Male  Lifestyle  . Physical activity:    Days per week: 0 days    Minutes per session: 0 min  . Stress: Rather much  Relationships  . Social connections:    Talks on phone: More than three times a week    Gets together: Once a week    Attends religious service: Never    Active member of club or organization: No    Attends meetings of clubs or organizations: Never    Relationship status: Living with partner  . Intimate partner violence:    Fear of current or ex partner: No    Emotionally abused: No    Physically abused: No    Forced sexual activity: No  Other Topics Concern  . Not on file  Social History Narrative  . Not on file     Current Outpatient Medications:  .  ACCU-CHEK SOFTCLIX LANCETS lancets, , Disp: , Rfl:  .  acetaminophen (TYLENOL 8 HOUR) 650 MG CR tablet, Take 1 tablet (650 mg total) by mouth every 8 (eight) hours as needed for pain., Disp: 90 tablet, Rfl: 0 .  albuterol (PROVENTIL HFA) 108 (90 Base) MCG/ACT inhaler, Inhale 2 puffs into the lungs 2 (two) times daily as needed for shortness of breath. , Disp: , Rfl:  .  Alcohol Swabs (B-D SINGLE USE SWABS REGULAR) PADS, , Disp: , Rfl:  .  ASPIRIN LOW DOSE 81 MG EC tablet, TAKE 1 TABLET (81 MG TOTAL) BY MOUTH DAILY., Disp: 30 tablet, Rfl: 0 .  Blood Glucose Monitoring Suppl (ACCU-CHEK AVIVA PLUS) w/Device KIT, , Disp: , Rfl:  .  furosemide (LASIX) 20 MG tablet, Take 1 tablet (20 mg total) by mouth daily., Disp: 90 tablet, Rfl: 1 .  Glucose Blood (BLOOD GLUCOSE TEST STRIPS) STRP, , Disp: , Rfl:  .  insulin regular (NOVOLIN R) 100 units/mL injection, INJECT 50 UNITS SUBCTANEOUSLY 3 TIMES A DAY BEFORE MEALS, PLUS SLIDING SCALE. MDD 100 UNITS, Disp: , Rfl:  .  Insulin Syringe-Needle U-100 31G X 15/64" 1 ML MISC, ONE  THREE TIMES DAILY, Disp: , Rfl:  .  irbesartan-hydrochlorothiazide (AVALIDE) 300-12.5 MG tablet, Take by mouth., Disp: , Rfl:  .   Lancets (ACCU-CHEK SOFT TOUCH) lancets, Use 1 each 4 (four) times daily, Disp: , Rfl:  .  levothyroxine (SYNTHROID, LEVOTHROID) 25 MCG tablet, TAKE 1 TABLET EVERY DAY  EXCEPT TAKE 2 TABLETS  ON  SUNDAYS, Disp: 100 tablet, Rfl: 0 .  metFORMIN (GLUCOPHAGE-XR) 500 MG 24 hr tablet, Take 500 mg by mouth. 2 tablets daily (total of 1000 mg daily), Disp: , Rfl:  .  metoprolol tartrate (LOPRESSOR) 25 MG tablet, Take 1 tablet (25 mg total) by mouth 2 (two) times daily., Disp: 180 tablet, Rfl: 1 .  Misc Natural Products (OSTEO BI-FLEX ADV JOINT SHIELD PO), Take by mouth 2 (two) times a week., Disp: , Rfl:  .  mometasone-formoterol (DULERA) 100-5 MCG/ACT AERO, Inhale 2 puffs into the lungs 2 (two) times daily., Disp: 3 Inhaler, Rfl: 1 .  montelukast (SINGULAIR) 10 MG tablet, Take 1 tablet (10 mg total) by mouth at bedtime., Disp: 90 tablet, Rfl: 1 .  omeprazole (PRILOSEC) 40 MG capsule, Take 1 capsule (40 mg total) by mouth daily., Disp: 90 capsule, Rfl: 1 .  Potassium 99 MG TABS, Take by mouth., Disp: , Rfl:  .  pregabalin (LYRICA) 300 MG capsule, Take 2 capsules (600 mg total) by mouth daily., Disp: 180 capsule, Rfl: 1 .  Probiotic Product (PROBIOTIC-10 PO), Take by mouth., Disp: , Rfl:  .  ranitidine (ZANTAC) 150 MG tablet, Take 1 tablet (150 mg total) by mouth at bedtime., Disp: 90 tablet, Rfl: 1 .  RELION INSULIN SYRINGE 1ML/31G 31G X 5/16" 1 ML MISC, , Disp: , Rfl:  .  rosuvastatin (CRESTOR) 20 MG tablet, Take 1 tablet (20 mg total) by mouth daily., Disp: 90 tablet, Rfl: 1 .  vitamin C (ASCORBIC ACID) 500 MG tablet, Take 500 mg by mouth 2 (two) times daily. , Disp: , Rfl:  .  VITAMIN D, CHOLECALCIFEROL, PO, Take 2,000 Units by mouth daily. , Disp: , Rfl:  .  benzonatate (TESSALON) 100 MG capsule, Take 1-2 capsules (100-200 mg total) by mouth 3 (three) times daily as needed for cough., Disp: 40 capsule, Rfl: 0 .  DULoxetine (CYMBALTA) 60 MG capsule, Take 1 capsule (60 mg total) by mouth daily., Disp:  30  capsule, Rfl: 0 .  insulin glargine (LANTUS) 100 UNIT/ML injection, Inject 80 Units into the skin at bedtime. , Disp: , Rfl:   Allergies  Allergen Reactions  . Codeine Other (See Comments)    "TRIPPED OUT"  DIDN'T LIKE THE [place]  . Atorvastatin     muscle pain  . Latex Rash  . Zolpidem Other (See Comments)    Sleep walk     ROS  Constitutional: Negative for fever or weight change.  Respiratory: Positive  for cough and some  shortness of breath.   Cardiovascular: Negative for chest pain or palpitations.  Gastrointestinal: Negative for abdominal pain, no bowel changes.  Musculoskeletal: Positive  for gait problem and  joint swelling.  Skin: Negative for rash.  Neurological: Negative for dizziness or headache.  No other specific complaints in a complete review of systems (except as listed in HPI above).  Objective  Vitals:   05/01/18 1314  BP: 116/70  Pulse: (!) 101  Resp: 16  Temp: (!) 97.4 F (36.3 C)  TempSrc: Oral  SpO2: 96%  Weight: 251 lb 6.4 oz (114 kg)  Height: '5\' 2"'$  (1.575 m)    Body mass index is 45.98 kg/m.  Physical Exam  Constitutional: Patient appears well-developed and well-nourished. Obese  No distress.  HEENT: head atraumatic, normocephalic, pupils equal and reactive to light,  neck supple, throat within normal limits Cardiovascular: Normal rate, regular rhythm and normal heart sounds.  No murmur heard. 1 plus  BLE edema. Pulmonary/Chest: Effort normal and breath sounds normal. No respiratory distress. Abdominal: Soft.  There is no tenderness. Psychiatric: Patient has a normal mood and affect. behavior is normal. Judgment and thought content normal.  Diabetic Foot Exam: Diabetic Foot Exam - Simple   Simple Foot Form Diabetic Foot exam was performed with the following findings:  Yes 05/01/2018  1:56 PM  Visual Inspection See comments:  Yes Sensation Testing See comments:  Yes Pulse Check Posterior Tibialis and Dorsalis pulse intact  bilaterally:  Yes Comments Failed monofilament and also has deformities from surgery       PHQ2/9: Depression screen [facility] 2/9 05/01/2018 02/07/2018 01/28/2018 11/30/2017 11/02/2017  Decreased Interest 3 0 '3 3 3  '$ Down, Depressed, Hopeless 3 0 '3 3 3  '$ PHQ - 2 Score 6 0 '6 6 6  '$ Altered sleeping 3 - '3 3 3  '$ Tired, decreased energy 3 - '3 3 3  '$ Change in appetite 3 - '3 3 3  '$ Feeling bad or failure about yourself  3 - '3 3 3  '$ Trouble concentrating 3 - '3 3 3  '$ Moving slowly or fidgety/restless 1 - '3 1 2  '$ Suicidal thoughts 2 - '2 1 3  '$ PHQ-9 Score 24 - '26 23 26  '$ Difficult doing work/chores Extremely dIfficult - Very difficult Somewhat difficult Very difficult  Some recent data might be hidden     Fall Risk: Fall Risk  05/01/2018 02/07/2018 01/28/2018 11/30/2017 11/02/2017  Falls in the past year? No No No No No  Comment - - - - -  Number falls in past yr: - - - - -  Injury with Fall? - - - - -  Risk Factor Category  - - - - -  Comment - - - - -  Risk for fall due to : - Impaired balance/gait - - -  Risk for fall due to: Comment - uses walker - - -  Follow up - - - - -     Functional  Status Survey: Is the patient deaf or have difficulty hearing?: No Does the patient have difficulty seeing, even when wearing glasses/contacts?: No Does the patient have difficulty concentrating, remembering, or making decisions?: Yes Does the patient have difficulty walking or climbing stairs?: Yes Does the patient have difficulty dressing or bathing?: No Does the patient have difficulty doing errands alone such as visiting a doctor's office or shopping?: No   Assessment & Plan  1. Poorly controlled type 2 diabetes mellitus with neuropathy ([name])  - POCT glycosylated hemoglobin (Hb A1C) - Ambulatory referral to [facility]  2. BMI 45.0-49.9, adult [facility])  Discussed with the patient the risk posed by an increased BMI. Discussed importance of portion control, calorie counting and at least 150 minutes of  physical activity weekly. Avoid sweet beverages and drink more water. Eat at least 6 servings of fruit and vegetables daily   3. Morbid (severe) obesity due to excess calories ([place])   4. MDD (major depressive disorder), recurrent episode, moderate ([place])  Very depressed, cannot afford seeing psychiatrist, but states duloxetine helped and wants a refill   5. GERD without esophagitis  Still has symptoms but improved   6. Chronic kidney disease (CKD), stage III (moderate) ([name])  - COMPLETE METABOLIC PANEL WITH GFR - VITAMIN D 25 Hydroxy (Vit-D Deficiency, Fractures) - Parathyroid hormone, intact (no Ca)  7. Essential hypertension  Towards low end of normal, but needs to take beta blocker, advised to monitor bp and may need to cut avalide to half   8. Edema extremities  improved  9. Obstructive sleep apnea   10. Diabetic polyneuropathy associated with type 2 diabetes mellitus ([place])  - Ambulatory referral to [facility]  11. Dyslipidemia associated with type 2 diabetes mellitus ([name])  - rosuvastatin (CRESTOR) 20 MG tablet; Take 1 tablet (20 mg total) by mouth daily.  Dispense: 90 tablet; Refill: 1 - Lipid panel  12. Benign hypertension  - metoprolol tartrate (LOPRESSOR) 25 MG tablet; Take 1 tablet (25 mg total) by mouth 2 (two) times daily.  Dispense: 180 tablet; Refill: 1  13. Gastroesophageal reflux disease without esophagitis  - omeprazole (PRILOSEC) 40 MG capsule; Take 1 capsule (40 mg total) by mouth daily.  Dispense: 90 capsule; Refill: 1  14. Asthma with exacerbation   - montelukast (SINGULAIR) 10 MG tablet; Take 1 tablet (10 mg total) by mouth at bedtime.  Dispense: 90 tablet; Refill: 1 Resume daily Dulera and tessalon perles for prn cough  15. Adult hypothyroidism  - TSH  16. Long-term use of high-risk medication  - CBC with Differential/Platelet - Vitamin B12

## 2018-05-02 LAB — CBC WITH DIFFERENTIAL/PLATELET
Basophils Absolute: 70 cells/uL (ref 0–200)
Basophils Relative: 0.8 %
Eosinophils Absolute: 431 cells/uL (ref 15–500)
Eosinophils Relative: 4.9 %
HCT: 35.7 % (ref 35.0–45.0)
Hemoglobin: 11.8 g/dL (ref 11.7–15.5)
Lymphs Abs: 1646 cells/uL (ref 850–3900)
MCH: 28.5 pg (ref 27.0–33.0)
MCHC: 33.1 g/dL (ref 32.0–36.0)
MCV: 86.2 fL (ref 80.0–100.0)
MPV: 12.4 fL (ref 7.5–12.5)
Monocytes Relative: 9.3 %
Neutro Abs: 5834 cells/uL (ref 1500–7800)
Neutrophils Relative %: 66.3 %
Platelets: 227 10*3/uL (ref 140–400)
RBC: 4.14 10*6/uL (ref 3.80–5.10)
RDW: 15.1 % — ABNORMAL HIGH (ref 11.0–15.0)
Total Lymphocyte: 18.7 %
WBC mixed population: 818 cells/uL (ref 200–950)
WBC: 8.8 10*3/uL (ref 3.8–10.8)

## 2018-05-02 LAB — VITAMIN D 25 HYDROXY (VIT D DEFICIENCY, FRACTURES): Vit D, 25-Hydroxy: 37 ng/mL (ref 30–100)

## 2018-05-02 LAB — VITAMIN B12: Vitamin B-12: 941 pg/mL (ref 200–1100)

## 2018-05-02 LAB — COMPLETE METABOLIC PANEL WITH GFR
AG Ratio: 2 (calc) (ref 1.0–2.5)
ALT: 22 U/L (ref 6–29)
AST: 20 U/L (ref 10–35)
Albumin: 4.5 g/dL (ref 3.6–5.1)
Alkaline phosphatase (APISO): 92 U/L (ref 33–130)
BUN/Creatinine Ratio: 30 (calc) — ABNORMAL HIGH (ref 6–22)
BUN: 44 mg/dL — ABNORMAL HIGH (ref 7–25)
CO2: 25 mmol/L (ref 20–32)
Calcium: 9.9 mg/dL (ref 8.6–10.4)
Chloride: 101 mmol/L (ref 98–110)
Creat: 1.47 mg/dL — ABNORMAL HIGH (ref 0.50–0.99)
GFR, Est African American: 42 mL/min/{1.73_m2} — ABNORMAL LOW (ref >=60)
GFR, Est Non African American: 36 mL/min/{1.73_m2} — ABNORMAL LOW (ref >=60)
Globulin: 2.3 g/dL (calc) (ref 1.9–3.7)
Glucose, Bld: 318 mg/dL — ABNORMAL HIGH (ref 65–139)
Potassium: 5 mmol/L (ref 3.5–5.3)
Sodium: 138 mmol/L (ref 135–146)
Total Bilirubin: 0.5 mg/dL (ref 0.2–1.2)
Total Protein: 6.8 g/dL (ref 6.1–8.1)

## 2018-05-02 LAB — LIPID PANEL
Cholesterol: 183 mg/dL (ref <200)
HDL: 35 mg/dL — ABNORMAL LOW (ref >50)
Non-HDL Cholesterol (Calc): 148 mg/dL (calc) — ABNORMAL HIGH (ref <130)
Total CHOL/HDL Ratio: 5.2 (calc) — ABNORMAL HIGH (ref <5.0)
Triglycerides: 503 mg/dL — ABNORMAL HIGH (ref <150)

## 2018-05-02 LAB — TSH: TSH: 1.65 mIU/L (ref 0.40–4.50)

## 2018-05-02 LAB — PARATHYROID HORMONE, INTACT (NO CA): PTH: 39 pg/mL (ref 14–64)

## 2018-05-03 ENCOUNTER — Other Ambulatory Visit: Payer: Self-pay | Admitting: Family Medicine

## 2018-05-03 ENCOUNTER — Telehealth: Payer: Self-pay

## 2018-05-03 DIAGNOSIS — N183 Chronic kidney disease, stage 3 unspecified: Secondary | ICD-10-CM

## 2018-05-03 NOTE — Telephone Encounter (Signed)
otc fish oil is not the same, I placed referral to [facility], hopefully they can help her with that also.  Referral placed for her to see nephrologist

## 2018-05-03 NOTE — Telephone Encounter (Signed)
Copied from [place] [id number]. Topic: General - Other >> May 03, 2018  9:46 AM Summer [name] wrote: Reason for CRM: Pt states she wants to let Dr Ancil Boozer nurse know that her sugar was 146 this morning 05/03/18.

## 2018-05-03 NOTE — Telephone Encounter (Signed)
Patient notified of lab results by phone. However states the [place] was too expensive, and wanted to know could she take a [place]. Patient states [facility] would be the preferred place for her Nephrology referral due to location.

## 2018-05-06 NOTE — Telephone Encounter (Signed)
Voicemail was full unable to leave message Dr. Ancil Boozer wanted me to tell the patient.

## 2018-05-11 ENCOUNTER — Other Ambulatory Visit: Payer: Self-pay | Admitting: Family Medicine

## 2018-05-12 NOTE — Telephone Encounter (Signed)
Please contact patient and or pharmacy. Her prescription does not seem to be prescribed by me.

## 2018-05-15 ENCOUNTER — Other Ambulatory Visit: Payer: Self-pay | Admitting: Family Medicine

## 2018-05-15 DIAGNOSIS — E1142 Type 2 diabetes mellitus with diabetic polyneuropathy: Secondary | ICD-10-CM | POA: Diagnosis not present

## 2018-05-15 DIAGNOSIS — E039 Hypothyroidism, unspecified: Secondary | ICD-10-CM | POA: Diagnosis not present

## 2018-05-15 DIAGNOSIS — Z794 Long term (current) use of insulin: Secondary | ICD-10-CM | POA: Diagnosis not present

## 2018-05-15 DIAGNOSIS — E1159 Type 2 diabetes mellitus with other circulatory complications: Secondary | ICD-10-CM | POA: Diagnosis not present

## 2018-05-15 DIAGNOSIS — E1169 Type 2 diabetes mellitus with other specified complication: Secondary | ICD-10-CM | POA: Diagnosis not present

## 2018-05-15 DIAGNOSIS — F331 Major depressive disorder, recurrent, moderate: Secondary | ICD-10-CM

## 2018-05-15 DIAGNOSIS — I10 Essential (primary) hypertension: Secondary | ICD-10-CM | POA: Diagnosis not present

## 2018-05-15 DIAGNOSIS — E785 Hyperlipidemia, unspecified: Secondary | ICD-10-CM | POA: Diagnosis not present

## 2018-05-15 NOTE — Telephone Encounter (Signed)
Copied from [place] [id number]. Topic: Quick Communication - Rx Refill/Question >> May 15, 2018 10:48 AM Reyne Dumas L wrote: Medication: DULoxetine (CYMBALTA) 60 MG capsule  Has the patient contacted their pharmacy? No, states physician wanted her to try first - it is working well (Agent: If no, request that the patient contact the pharmacy for the refill.) (Agent: If yes, when and what did the pharmacy advise?)  Preferred Pharmacy (with phone number or street name): [place], [place] [phone] (Phone) [phone] (Fax)  Agent: Please be advised that RX refills may take up to 3 business days. We ask that you follow-up with your pharmacy.

## 2018-05-15 NOTE — Telephone Encounter (Signed)
Refill request was sent to Dr. Krichna Sowles for approval and submission.  

## 2018-05-16 MED ORDER — DULOXETINE HCL 60 MG PO CPEP: 60.0000 mg | ORAL_CAPSULE | Freq: Every day | ORAL | 0 refills | Status: DC

## 2018-05-20 ENCOUNTER — Other Ambulatory Visit: Payer: Self-pay | Admitting: Family Medicine

## 2018-05-20 NOTE — Telephone Encounter (Signed)
Refill request was sent to Dr. Krichna Sowles for approval and submission.  

## 2018-05-20 NOTE — Telephone Encounter (Signed)
Copied from [place] [id number]. Topic: Quick Communication - See Telephone Encounter >> May 20, 2018  8:55 AM Conception Chancy, NT wrote: CRM for notification. See Telephone encounter for: 05/20/18.  Patient is calling and requesting a refill on irbesartan-hydrochlorothiazide (AVALIDE) 300-12.5 MG tablet. She states the pharmacy told her that Dr. Ancil Boozer has never prescribed this and patient states she has a bottle that was written on 10/09/17 with Dr. Ancil Boozer name. Please advise.  [address] [place] [address] Phone: [phone] Fax: [phone]

## 2018-05-21 MED ORDER — IRBESARTAN-HYDROCHLOROTHIAZIDE 300-12.5 MG PO TABS: 1.0000 | ORAL_TABLET | Freq: Every day | ORAL | 5 refills | Status: DC

## 2018-05-21 NOTE — Progress Notes (Deleted)
[facility] Consultation      Assessment and Plan:  67 yo morbidly obese female with asthma, volume overload  Asthma.  --Asthma with elevated eosinophils  --Continue symbicort. Continue singulair.   Jerrye Bushy.  --Symptomatic, and may be contributing to dyspnea. --Continue omeprazole and ranitidine, discussed anti-reflux measures.   Volume overload.  2+ bilateral lower extremity edema. -Continue Lasix.  Obstructive sleep apnea --Intolerant of cpap mask.  --s/p mask fitting, encouraged to use CPAP every night.   Morbid obesity.  --Weight loss would be beneficial for her health and breathing.   Deconditioning with unstable gait unstable gait.  --Will refer to PT.   Date: 05/21/2018  MRN# [id number] Summer [name] 08/04/1951   Summer [name] is a 67 y.o. old female seen in consultation for chief complaint of:    No chief complaint on file.   HPI:  The patient is a 67 yo female with persistent dyspnea. At last visit she had poorly controlled asthma, symptomatic gerd, volume overload with peripheral edema and OSA intolerant of CPAP.  She was started on Advair, changed to Symbicort by insurance.  She was referred to PT due to deconditioning, she is encouraged to use her CPAP and was encouraged to lose weight.  CXR 07/16/17>> Bibasilar atelectasis.   **Echo 07/04/17>> TI=14%; RV systolic function reported as "normal" **08/18/16 CBC>> Eos=438 **05/12/14; Echo>> EF=55%; Pulm systolic pressure was normal.   Medication:    Current Outpatient Medications:  .  ACCU-CHEK SOFTCLIX LANCETS lancets, , Disp: , Rfl:  .  acetaminophen (TYLENOL 8 HOUR) 650 MG CR tablet, Take 1 tablet (650 mg total) by mouth every 8 (eight) hours as needed for pain., Disp: 90 tablet, Rfl: 0 .  albuterol (PROVENTIL HFA) 108 (90 Base) MCG/ACT inhaler, Inhale 2 puffs into the lungs 2 (two) times daily as needed for shortness of breath. , Disp: , Rfl:  .  Alcohol Swabs (B-D SINGLE USE SWABS REGULAR)  PADS, , Disp: , Rfl:  .  ASPIRIN LOW DOSE 81 MG EC tablet, TAKE 1 TABLET (81 MG TOTAL) BY MOUTH DAILY., Disp: 30 tablet, Rfl: 0 .  benzonatate (TESSALON) 100 MG capsule, Take 1-2 capsules (100-200 mg total) by mouth 3 (three) times daily as needed for cough., Disp: 40 capsule, Rfl: 0 .  Blood Glucose Monitoring Suppl (ACCU-CHEK AVIVA PLUS) w/Device KIT, , Disp: , Rfl:  .  DULoxetine (CYMBALTA) 60 MG capsule, Take 1 capsule (60 mg total) by mouth daily., Disp: 90 capsule, Rfl: 0 .  furosemide (LASIX) 20 MG tablet, Take 1 tablet (20 mg total) by mouth daily., Disp: 90 tablet, Rfl: 1 .  Glucose Blood (BLOOD GLUCOSE TEST STRIPS) STRP, , Disp: , Rfl:  .  insulin glargine (LANTUS) 100 UNIT/ML injection, Inject 80 Units into the skin at bedtime. , Disp: , Rfl:  .  insulin regular (NOVOLIN R) 100 units/mL injection, INJECT 50 UNITS SUBCTANEOUSLY 3 TIMES A DAY BEFORE MEALS, PLUS SLIDING SCALE. MDD 100 UNITS, Disp: , Rfl:  .  Insulin Syringe-Needle U-100 31G X 15/64" 1 ML MISC, ONE  THREE TIMES DAILY, Disp: , Rfl:  .  irbesartan-hydrochlorothiazide (AVALIDE) 300-12.5 MG tablet, Take by mouth., Disp: , Rfl:  .  Lancets (ACCU-CHEK SOFT TOUCH) lancets, Use 1 each 4 (four) times daily, Disp: , Rfl:  .  levothyroxine (SYNTHROID, LEVOTHROID) 25 MCG tablet, TAKE 1 TABLET EVERY DAY  EXCEPT TAKE 2 TABLETS  ON  SUNDAYS, Disp: 100 tablet, Rfl: 0 .  metFORMIN (GLUCOPHAGE-XR) 500 MG 24  hr tablet, Take 500 mg by mouth. 2 tablets daily (total of 1000 mg daily), Disp: , Rfl:  .  metoprolol tartrate (LOPRESSOR) 25 MG tablet, Take 1 tablet (25 mg total) by mouth 2 (two) times daily., Disp: 180 tablet, Rfl: 1 .  Misc Natural Products (OSTEO BI-FLEX ADV JOINT SHIELD PO), Take by mouth 2 (two) times a week., Disp: , Rfl:  .  mometasone-formoterol (DULERA) 100-5 MCG/ACT AERO, Inhale 2 puffs into the lungs 2 (two) times daily., Disp: 3 Inhaler, Rfl: 1 .  montelukast (SINGULAIR) 10 MG tablet, Take 1 tablet (10 mg total) by mouth at  bedtime., Disp: 90 tablet, Rfl: 1 .  omeprazole (PRILOSEC) 40 MG capsule, Take 1 capsule (40 mg total) by mouth daily., Disp: 90 capsule, Rfl: 1 .  Potassium 99 MG TABS, Take by mouth., Disp: , Rfl:  .  pregabalin (LYRICA) 300 MG capsule, Take 2 capsules (600 mg total) by mouth daily., Disp: 180 capsule, Rfl: 1 .  Probiotic Product (PROBIOTIC-10 PO), Take by mouth., Disp: , Rfl:  .  ranitidine (ZANTAC) 150 MG tablet, Take 1 tablet (150 mg total) by mouth at bedtime., Disp: 90 tablet, Rfl: 1 .  RELION INSULIN SYRINGE 1ML/31G 31G X 5/16" 1 ML MISC, , Disp: , Rfl:  .  rosuvastatin (CRESTOR) 20 MG tablet, Take 1 tablet (20 mg total) by mouth daily., Disp: 90 tablet, Rfl: 1 .  vitamin C (ASCORBIC ACID) 500 MG tablet, Take 500 mg by mouth 2 (two) times daily. , Disp: , Rfl:  .  VITAMIN D, CHOLECALCIFEROL, PO, Take 2,000 Units by mouth daily. , Disp: , Rfl:    Allergies:  Codeine; Atorvastatin; Latex; and Zolpidem      LABORATORY PANEL:   CBC No results for input(s): WBC, HGB, HCT, PLT in the last 168 hours. ------------------------------------------------------------------------------------------------------------------  Chemistries  No results for input(s): NA, K, CL, CO2, GLUCOSE, BUN, CREATININE, CALCIUM, MG, AST, ALT, ALKPHOS, BILITOT in the last 168 hours.  Invalid input(s): GFRCGP ------------------------------------------------------------------------------------------------------------------  Cardiac Enzymes No results for input(s): TROPONINI in the last 168 hours. ------------------------------------------------------------  RADIOLOGY:  No results found.     Thank  you for the consultation and for allowing [facility] to assist in the care of your patient. Our recommendations are noted above.  Please contact us if we can be of further service.  Marda Stalker, M.D., F.C.C.P.  Board Certified in Internal Medicine, Pulmonary Medicine, [place], and Sleep Medicine.  [facility] Office Number: [phone]   05/21/2018

## 2018-05-22 ENCOUNTER — Ambulatory Visit: Payer: Self-pay | Admitting: Internal Medicine

## 2018-05-22 ENCOUNTER — Encounter: Payer: Self-pay | Admitting: Internal Medicine

## 2018-05-22 ENCOUNTER — Ambulatory Visit: Payer: Medicare HMO | Admitting: Internal Medicine

## 2018-05-22 VITALS — BP 168/80 | HR 123 | Ht 62.0 in | Wt 251.0 lb

## 2018-05-22 DIAGNOSIS — R0602 Shortness of breath: Secondary | ICD-10-CM

## 2018-05-22 DIAGNOSIS — G4733 Obstructive sleep apnea (adult) (pediatric): Secondary | ICD-10-CM

## 2018-05-22 NOTE — Patient Instructions (Signed)
Restart dulera 2 puffs twice daily for 2 weeks, if this does not help, then your asthma is likely not the major problem contributing to your trouble breathing. If that is the case, stop dulera and use albuterol as needed. Do not use dulera as needed.

## 2018-05-22 NOTE — Progress Notes (Signed)
[facility] Consultation      Assessment and Plan:  67 yo morbidly obese female with asthma, volume overload  Asthma.  --Continue dyspnea on exertion is not using dulera regularly.  --recommend that she use dulera 2 puffs twice daily, if it does not help then I suspect that asthma is not the major driver of her dyspnea.   Dyspnea on exertion.  --I suspect that her dyspnea is multifactorial from asthma, obesity, deconditioning, volume overload.  --Recommend that she try to increase her activity as tolerated.   Jerrye Bushy.  --Symptomatic, and may be contributing to dyspnea. --Continue omeprazole and ranitidine, discussed anti-reflux measures.   Volume overload.  2+ bilateral lower extremity edema. -Continue Lasix.  Obstructive sleep apnea --Intolerant of cpap mask.  --She is no longer using cpap, recommend that she restart.   Morbid obesity.  --Weight loss recommended.    Date: 05/22/2018  MRN# [id number] Summer [name] 27-Aug-1951   Summer [name] is a 67 y.o. old female seen in consultation for chief complaint of:    Chief Complaint  Patient presents with  . Follow-up    SOB w/activity: dry cough    HPI:  The patient is a 67 yo female with persistent dyspnea. At last visit she had poorly controlled asthma, symptomatic gerd, volume overload with peripheral edema and OSA intolerant of CPAP.   She remains on dulera about once or twice per week. She takes her albuterol about the same.  She gets winded with mild activity, and has been that way for several years.  She continues on zantac and omeprazole, she feels that her reflux is well controlled.  She is no longer on cpap.   CXR 07/16/17>>Bibasilar atelectasis.   **Echo 07/04/17; EX=51%>> RV systolic function reported as "normal" **08/18/16 CBC>> Eos=438 **05/12/14; Echo>> EF=55%; Pulm systolic pressure was normal.   Medication:    Current Outpatient Medications:  .  ACCU-CHEK SOFTCLIX LANCETS lancets, ,  Disp: , Rfl:  .  acetaminophen (TYLENOL 8 HOUR) 650 MG CR tablet, Take 1 tablet (650 mg total) by mouth every 8 (eight) hours as needed for pain., Disp: 90 tablet, Rfl: 0 .  albuterol (PROVENTIL HFA) 108 (90 Base) MCG/ACT inhaler, Inhale 2 puffs into the lungs 2 (two) times daily as needed for shortness of breath. , Disp: , Rfl:  .  Alcohol Swabs (B-D SINGLE USE SWABS REGULAR) PADS, , Disp: , Rfl:  .  ASPIRIN LOW DOSE 81 MG EC tablet, TAKE 1 TABLET (81 MG TOTAL) BY MOUTH DAILY., Disp: 30 tablet, Rfl: 0 .  Blood Glucose Monitoring Suppl (ACCU-CHEK AVIVA PLUS) w/Device KIT, , Disp: , Rfl:  .  DULoxetine (CYMBALTA) 60 MG capsule, Take 1 capsule (60 mg total) by mouth daily., Disp: 90 capsule, Rfl: 0 .  furosemide (LASIX) 20 MG tablet, Take 1 tablet (20 mg total) by mouth daily., Disp: 90 tablet, Rfl: 1 .  Glucose Blood (BLOOD GLUCOSE TEST STRIPS) STRP, , Disp: , Rfl:  .  insulin regular (NOVOLIN R) 100 units/mL injection, INJECT 50 UNITS SUBCTANEOUSLY 3 TIMES A DAY BEFORE MEALS, PLUS SLIDING SCALE. MDD 100 UNITS, Disp: , Rfl:  .  Insulin Syringe-Needle U-100 31G X 15/64" 1 ML MISC, ONE  THREE TIMES DAILY, Disp: , Rfl:  .  irbesartan-hydrochlorothiazide (AVALIDE) 300-12.5 MG tablet, Take 1 tablet by mouth daily., Disp: , Rfl:  .  Lancets (ACCU-CHEK SOFT TOUCH) lancets, Use 1 each 4 (four) times daily, Disp: , Rfl:  .  levothyroxine (SYNTHROID, LEVOTHROID)  25 MCG tablet, TAKE 1 TABLET EVERY DAY  EXCEPT TAKE 2 TABLETS  ON  SUNDAYS, Disp: 100 tablet, Rfl: 0 .  metFORMIN (GLUCOPHAGE-XR) 500 MG 24 hr tablet, Take 500 mg by mouth. 2 tablets daily (total of 1000 mg daily), Disp: , Rfl:  .  metoprolol tartrate (LOPRESSOR) 25 MG tablet, Take 1 tablet (25 mg total) by mouth 2 (two) times daily., Disp: 180 tablet, Rfl: 1 .  Misc Natural Products (OSTEO BI-FLEX ADV JOINT SHIELD PO), Take by mouth 2 (two) times a week., Disp: , Rfl:  .  mometasone-formoterol (DULERA) 100-5 MCG/ACT AERO, Inhale 2 puffs into the lungs  2 (two) times daily., Disp: 3 Inhaler, Rfl: 1 .  montelukast (SINGULAIR) 10 MG tablet, Take 1 tablet (10 mg total) by mouth at bedtime., Disp: 90 tablet, Rfl: 1 .  Omega 3 1000 MG CAPS, Take 1 capsule by mouth 2 (two) times daily., Disp: , Rfl:  .  omeprazole (PRILOSEC) 40 MG capsule, Take 1 capsule (40 mg total) by mouth daily., Disp: 90 capsule, Rfl: 1 .  Potassium 99 MG TABS, Take by mouth., Disp: , Rfl:  .  pregabalin (LYRICA) 300 MG capsule, Take 2 capsules (600 mg total) by mouth daily., Disp: 180 capsule, Rfl: 1 .  Probiotic Product (PROBIOTIC-10 PO), Take by mouth., Disp: , Rfl:  .  ranitidine (ZANTAC) 150 MG tablet, Take 1 tablet (150 mg total) by mouth at bedtime., Disp: 90 tablet, Rfl: 1 .  RELION INSULIN SYRINGE 1ML/31G 31G X 5/16" 1 ML MISC, , Disp: , Rfl:  .  rosuvastatin (CRESTOR) 20 MG tablet, Take 1 tablet (20 mg total) by mouth daily., Disp: 90 tablet, Rfl: 1 .  vitamin C (ASCORBIC ACID) 500 MG tablet, Take 500 mg by mouth 2 (two) times daily. , Disp: , Rfl:  .  VITAMIN D, CHOLECALCIFEROL, PO, Take 2,000 Units by mouth daily. , Disp: , Rfl:    Allergies:  Codeine; Atorvastatin; Latex; and Zolpidem  Review of Systems:  Constitutional: Feels well. Cardiovascular: No chest pain.  Pulmonary: Denies hemoptysis.   The remainder of systems were reviewed and were found to be negative other than what is documented in the HPI.   Physical Examination:   VS: BP (!) 168/80 (BP Location: Left Arm, Cuff Size: Normal)   Pulse (!) 123   Ht '5\' 2"'$  (1.575 m)   Wt 251 lb (113.9 kg)   SpO2 93%   BMI 45.91 kg/m   General Appearance: No distress  Neuro:without focal findings, mental status, speech normal, alert and oriented HEENT: PERRLA, EOM intact Pulmonary: No wheezing, No rales  CardiovascularNormal S1,S2.  No m/r/g.  Abdomen: Benign, Soft, non-tender, No masses Renal:  No costovertebral tenderness  GU:  No performed at this time. Endoc: No evident thyromegaly, no signs of  acromegaly or Cushing features Skin:   warm, no rashes, no ecchymosis  Extremities: normal, no cyanosis, clubbing.       LABORATORY PANEL:   CBC No results for input(s): WBC, HGB, HCT, PLT in the last 168 hours. ------------------------------------------------------------------------------------------------------------------  Chemistries  No results for input(s): NA, K, CL, CO2, GLUCOSE, BUN, CREATININE, CALCIUM, MG, AST, ALT, ALKPHOS, BILITOT in the last 168 hours.  Invalid input(s): GFRCGP ------------------------------------------------------------------------------------------------------------------  Cardiac Enzymes No results for input(s): TROPONINI in the last 168 hours. ------------------------------------------------------------  RADIOLOGY:  No results found.     Thank  you for the consultation and for allowing [facility] to assist in the care of your patient.  Our recommendations are noted above.  Please contact us if we can be of further service.   Marda Stalker, M.D., F.C.C.P.  Board Certified in Internal Medicine, Pulmonary Medicine, [place], and Sleep Medicine.  [facility] Office Number: [phone]  05/22/2018

## 2018-05-27 ENCOUNTER — Encounter: Payer: Self-pay | Admitting: Family Medicine

## 2018-05-27 ENCOUNTER — Ambulatory Visit (INDEPENDENT_AMBULATORY_CARE_PROVIDER_SITE_OTHER): Payer: Medicare HMO | Admitting: Family Medicine

## 2018-05-27 VITALS — BP 128/58 | HR 102 | Temp 97.8°F | Resp 16 | Ht 62.0 in | Wt 248.8 lb

## 2018-05-27 DIAGNOSIS — F331 Major depressive disorder, recurrent, moderate: Secondary | ICD-10-CM | POA: Diagnosis not present

## 2018-05-27 DIAGNOSIS — L0100 Impetigo, unspecified: Secondary | ICD-10-CM | POA: Diagnosis not present

## 2018-05-27 DIAGNOSIS — Z23 Encounter for immunization: Secondary | ICD-10-CM

## 2018-05-27 MED ORDER — MUPIROCIN 2 % EX OINT: 1.0000 "application " | TOPICAL_OINTMENT | Freq: Two times a day (BID) | CUTANEOUS | 0 refills | Status: DC

## 2018-05-27 NOTE — Progress Notes (Signed)
Name: Summer [name]   MRN: [id number]    DOB: 1951-02-06   Date:05/27/2018       Progress Note  Subjective  Chief Complaint  Chief Complaint  Patient presents with  . Depression  . Follow-up    Needs Mammogram ordered  . Blister    Onset- Saturday on her chin, has been bothering her and bleeding if she when rubbed the sore too hard.    HPI  Rash: noticed a rash on her chin two days ago, she states she woke up with redness and some honey like material draining. Mild soreness, applying topical medication and is a little better today. No fever or chills  Major depression: phq 9 very high for a long time, taking medication but it does not seem to be working, used to see Dr. Carrolyn Leigh but stopped going because of cost . We will try referring her to our chronic care management team. She denies suicidal thoughts or ideation    Patient Active Problem List   Diagnosis Date Noted  . (HFpEF) heart failure with preserved ejection fraction ([place]) 08/31/2017  . Moderate persistent asthma 08/31/2017  . Hyperlipidemia due to type 2 diabetes mellitus ([place]) 06/14/2017  . Chest pain of unknown etiology 10/06/2016  . Essential hypertension 10/06/2016  . Morbid obesity with BMI of 40.0-44.9, adult ([place]) 10/06/2016  . Dyslipidemia associated with type 2 diabetes mellitus ([place]) 08/18/2016  . Charcot foot due to diabetes mellitus ([place]) 02/22/2016  . Hypertriglyceridemia 07/27/2015  . Acquired abduction deformity of foot 07/12/2015  . Osteoarthritis of subtalar joint 07/12/2015  . Poorly controlled type 2 diabetes mellitus with neuropathy ([place]) 07/12/2015  . Arthritis of foot, degenerative 07/12/2015  . Carpal tunnel syndrome 04/17/2015  . Chronic constipation 04/17/2015  . Insomnia, persistent 04/17/2015  . Chronic kidney disease (CKD), stage III (moderate) ([place]) 04/17/2015  . Decreased exercise tolerance 04/17/2015  . Diabetes mellitus with polyneuropathy ([place]) 04/17/2015  . Gastro-esophageal reflux  disease without esophagitis 04/17/2015  . Bursitis, trochanteric 04/17/2015  . Cephalalgia 04/17/2015  . Benign hypertension 04/17/2015  . Adult hypothyroidism 04/17/2015  . Hearing loss 04/17/2015  . Chronic recurrent major depressive disorder ([place]) 04/17/2015  . Neurogenic claudication 04/17/2015  . Obesity (BMI 35.0-39.9 without comorbidity) 04/17/2015  . Hypo-ovarianism 04/17/2015  . Perennial allergic rhinitis with seasonal variation 04/17/2015  . Acne erythematosa 04/17/2015  . Dyskinesia, tardive 04/17/2015  . Memory loss 04/17/2015  . Impingement syndrome of shoulder 04/17/2015  . Dermatitis, stasis 04/17/2015  . Obstructive sleep apnea 05/14/2014  . Dyspnea 05/06/2014  . Hyperlipidemia 02/06/2012  . LBP (low back pain) 09/16/2008    Past Surgical History:  Procedure Laterality Date  . ANKLE SURGERY Left approx Jan 2018  . CATARACT EXTRACTION  01/2011   right  . eye lid surgery  2013   bilateral  . FOOT SURGERY    . NECK SURGERY    . TUBAL LIGATION    . VAGINAL HYSTERECTOMY  1989    Family History  Problem Relation Age of Onset  . Heart attack Mother   . Aneurysm Mother     Social History   Socioeconomic History  . Marital status: Significant Other    Spouse name: Not on file  . Number of children: 1  . Years of education: Not on file  . Highest education level: High school graduate  Occupational History  . Not on file  Social Needs  . Financial resource strain: Not hard at all  . Food insecurity:  Worry: Never true    Inability: Never true  . Transportation needs:    Medical: No    Non-medical: No  Tobacco Use  . Smoking status: Never Smoker  . Smokeless tobacco: Never Used  Substance and Sexual Activity  . Alcohol use: No    Alcohol/week: 0.0 - 1.0 standard drinks    Frequency: Never  . Drug use: No  . Sexual activity: Yes    Partners: Male  Lifestyle  . Physical activity:    Days per week: 0 days    Minutes per session: 0 min  .  Stress: Rather much  Relationships  . Social connections:    Talks on phone: More than three times a week    Gets together: Once a week    Attends religious service: Never    Active member of club or organization: No    Attends meetings of clubs or organizations: Never    Relationship status: Living with partner  . Intimate partner violence:    Fear of current or ex partner: No    Emotionally abused: No    Physically abused: No    Forced sexual activity: No  Other Topics Concern  . Not on file  Social History Narrative  . Not on file     Current Outpatient Medications:  .  ACCU-CHEK SOFTCLIX LANCETS lancets, , Disp: , Rfl:  .  acetaminophen (TYLENOL 8 HOUR) 650 MG CR tablet, Take 1 tablet (650 mg total) by mouth every 8 (eight) hours as needed for pain., Disp: 90 tablet, Rfl: 0 .  albuterol (PROVENTIL HFA) 108 (90 Base) MCG/ACT inhaler, Inhale 2 puffs into the lungs 2 (two) times daily as needed for shortness of breath. , Disp: , Rfl:  .  Alcohol Swabs (B-D SINGLE USE SWABS REGULAR) PADS, , Disp: , Rfl:  .  ASPIRIN LOW DOSE 81 MG EC tablet, TAKE 1 TABLET (81 MG TOTAL) BY MOUTH DAILY., Disp: 30 tablet, Rfl: 0 .  Blood Glucose Monitoring Suppl (ACCU-CHEK AVIVA PLUS) w/Device KIT, , Disp: , Rfl:  .  DULoxetine (CYMBALTA) 60 MG capsule, Take 1 capsule (60 mg total) by mouth daily., Disp: 90 capsule, Rfl: 0 .  furosemide (LASIX) 20 MG tablet, Take 1 tablet (20 mg total) by mouth daily., Disp: 90 tablet, Rfl: 1 .  Glucose Blood (BLOOD GLUCOSE TEST STRIPS) STRP, , Disp: , Rfl:  .  insulin regular (NOVOLIN R) 100 units/mL injection, INJECT 50 UNITS SUBCTANEOUSLY 3 TIMES A DAY BEFORE MEALS, PLUS SLIDING SCALE. MDD 100 UNITS, Disp: , Rfl:  .  Insulin Syringe-Needle U-100 31G X 15/64" 1 ML MISC, ONE  THREE TIMES DAILY, Disp: , Rfl:  .  irbesartan-hydrochlorothiazide (AVALIDE) 300-12.5 MG tablet, Take 1 tablet by mouth daily., Disp: , Rfl:  .  Lancets (ACCU-CHEK SOFT TOUCH) lancets, Use 1 each  4 (four) times daily, Disp: , Rfl:  .  levothyroxine (SYNTHROID, LEVOTHROID) 25 MCG tablet, TAKE 1 TABLET EVERY DAY  EXCEPT TAKE 2 TABLETS  ON  SUNDAYS, Disp: 100 tablet, Rfl: 0 .  metFORMIN (GLUCOPHAGE-XR) 500 MG 24 hr tablet, Take 500 mg by mouth. 2 tablets daily (total of 1000 mg daily), Disp: , Rfl:  .  metoprolol tartrate (LOPRESSOR) 25 MG tablet, Take 1 tablet (25 mg total) by mouth 2 (two) times daily., Disp: 180 tablet, Rfl: 1 .  Misc Natural Products (OSTEO BI-FLEX ADV JOINT SHIELD PO), Take by mouth 2 (two) times a week., Disp: , Rfl:  .  mometasone-formoterol (DULERA) 100-5  MCG/ACT AERO, Inhale 2 puffs into the lungs 2 (two) times daily., Disp: 3 Inhaler, Rfl: 1 .  montelukast (SINGULAIR) 10 MG tablet, Take 1 tablet (10 mg total) by mouth at bedtime., Disp: 90 tablet, Rfl: 1 .  Omega 3 1000 MG CAPS, Take 1 capsule by mouth 2 (two) times daily., Disp: , Rfl:  .  omeprazole (PRILOSEC) 40 MG capsule, Take 1 capsule (40 mg total) by mouth daily., Disp: 90 capsule, Rfl: 1 .  Potassium 99 MG TABS, Take by mouth., Disp: , Rfl:  .  pregabalin (LYRICA) 300 MG capsule, Take 2 capsules (600 mg total) by mouth daily., Disp: 180 capsule, Rfl: 1 .  Probiotic Product (PROBIOTIC-10 PO), Take by mouth., Disp: , Rfl:  .  ranitidine (ZANTAC) 150 MG tablet, Take 1 tablet (150 mg total) by mouth at bedtime., Disp: 90 tablet, Rfl: 1 .  RELION INSULIN SYRINGE 1ML/31G 31G X 5/16" 1 ML MISC, , Disp: , Rfl:  .  rosuvastatin (CRESTOR) 20 MG tablet, Take 1 tablet (20 mg total) by mouth daily., Disp: 90 tablet, Rfl: 1 .  vitamin C (ASCORBIC ACID) 500 MG tablet, Take 500 mg by mouth 2 (two) times daily. , Disp: , Rfl:  .  VITAMIN D, CHOLECALCIFEROL, PO, Take 2,000 Units by mouth daily. , Disp: , Rfl:   Allergies  Allergen Reactions  . Codeine Other (See Comments)    "TRIPPED OUT"  DIDN'T LIKE THE [place]  . Atorvastatin     muscle pain  . Latex Rash  . Zolpidem Other (See Comments)    Sleep walk    I  personally reviewed active problem list, medication list, allergies, family history, social history with the patient/caregiver today.   ROS  Ten systems reviewed and is negative except as mentioned in HPI   Objective  Vitals:   05/27/18 1153  BP: (!) 128/58  Pulse: (!) 102  Resp: 16  Temp: 97.8 F (36.6 C)  TempSrc: Oral  SpO2: 98%  Weight: 248 lb 12.8 oz (112.9 kg)  Height: '5\' 2"'$  (1.575 m)    Body mass index is 45.51 kg/m.  Physical Exam  Constitutional: Patient appears well-developed and well-nourished. Obese  No distress.  HEENT: head atraumatic, normocephalic, pupils equal and reactive to light,  neck supple, throat within normal limits Cardiovascular: Normal rate, regular rhythm and normal heart sounds.  No murmur heard. No BLE edema. Pulmonary/Chest: Effort normal and breath sounds normal. No respiratory distress. Abdominal: Soft.  There is no tenderness. Skin: rash on right lower chin, some honey crusting aspect to it, erythematous base  Psychiatric: Patient has a normal mood and affect. behavior is normal. Judgment and thought content normal.  Recent Results (from the past 2160 hour(s))  POCT glycosylated hemoglobin (Hb A1C)     Status: Abnormal   Collection Time: 05/01/18  2:07 PM  Result Value Ref Range   Hemoglobin A1C 9.8 (A) 4.0 - 5.6 %   HbA1c POC (<> result, manual entry)     HbA1c, POC (prediabetic range)     HbA1c, POC (controlled diabetic range)    Lipid panel     Status: Abnormal   Collection Time: 05/01/18  2:18 PM  Result Value Ref Range   Cholesterol 183 <200 mg/dL   HDL 35 (L) >50 mg/dL   Triglycerides 503 (H) <150 mg/dL    Comment: . If a non-fasting specimen was collected, consider repeat triglyceride testing on a fasting specimen if clinically indicated.  Yates Decamp et al. Lenna Sciara.  of Clin. Lipidol. 9767;3:419-379. . . There is increased risk of pancreatitis when the  triglyceride concentration is very high  (> or = 500 mg/dL, especially if  > or = 1000 mg/dL).  Yates Decamp et al. J. of Clin. Lipidol. 0240;9:735-329. Marland Kitchen    LDL Cholesterol (Calc)  mg/dL (calc)    Comment: . LDL cholesterol not calculated. Triglyceride levels greater than 400 mg/dL invalidate calculated LDL results. . Reference range: <100 . Desirable range <100 mg/dL for primary prevention;   <70 mg/dL for patients with CHD or diabetic patients  with > or = 2 CHD risk factors. Marland Kitchen LDL-C is now calculated using the Martin-Hopkins  calculation, which is a validated novel method providing  better accuracy than the Friedewald equation in the  estimation of LDL-C.  Cresenciano Genre et al. Annamaria Helling. 9242;683(41): 2061-2068  (http://education.[facility].com/faq/FAQ164)    Total CHOL/HDL Ratio 5.2 (H) <5.0 (calc)   Non-HDL Cholesterol (Calc) 148 (H) <130 mg/dL (calc)    Comment: For patients with diabetes plus 1 major ASCVD risk  factor, treating to a non-HDL-C goal of <100 mg/dL  (LDL-C of <70 mg/dL) is considered a therapeutic  option.   COMPLETE METABOLIC PANEL WITH GFR     Status: Abnormal   Collection Time: 05/01/18  2:18 PM  Result Value Ref Range   Glucose, Bld 318 (H) 65 - 139 mg/dL    Comment: .        Non-fasting reference interval .    BUN 44 (H) 7 - 25 mg/dL   Creat 1.47 (H) 0.50 - 0.99 mg/dL    Comment: For patients >5 years of age, the reference limit for Creatinine is approximately 13% higher for people identified as African-American. .    GFR, Est Non African American 36 (L) > OR = 60 mL/min/1.41m   GFR, Est African American 42 (L) > OR = 60 mL/min/1.773m  BUN/Creatinine Ratio 30 (H) 6 - 22 (calc)   Sodium 138 135 - 146 mmol/L   Potassium 5.0 3.5 - 5.3 mmol/L   Chloride 101 98 - 110 mmol/L   CO2 25 20 - 32 mmol/L   Calcium 9.9 8.6 - 10.4 mg/dL   Total Protein 6.8 6.1 - 8.1 g/dL   Albumin 4.5 3.6 - 5.1 g/dL   Globulin 2.3 1.9 - 3.7 g/dL (calc)   AG Ratio 2.0 1.0 - 2.5 (calc)   Total Bilirubin 0.5 0.2 - 1.2 mg/dL   Alkaline  phosphatase (APISO) 92 33 - 130 U/L   AST 20 10 - 35 U/L   ALT 22 6 - 29 U/L  CBC with Differential/Platelet     Status: Abnormal   Collection Time: 05/01/18  2:18 PM  Result Value Ref Range   WBC 8.8 3.8 - 10.8 Thousand/uL   RBC 4.14 3.80 - 5.10 Million/uL   Hemoglobin 11.8 11.7 - 15.5 g/dL   HCT 35.7 35.0 - 45.0 %   MCV 86.2 80.0 - 100.0 fL   MCH 28.5 27.0 - 33.0 pg   MCHC 33.1 32.0 - 36.0 g/dL   RDW 15.1 (H) 11.0 - 15.0 %   Platelets 227 140 - 400 Thousand/uL   MPV 12.4 7.5 - 12.5 fL   Neutro Abs 5,834 1,500 - 7,800 cells/uL   Lymphs Abs 1,646 850 - 3,900 cells/uL   WBC mixed population 818 200 - 950 cells/uL   Eosinophils Absolute 431 15 - 500 cells/uL   Basophils Absolute 70 0 - 200 cells/uL   Neutrophils Relative % 66.3 %  Total Lymphocyte 18.7 %   Monocytes Relative 9.3 %   Eosinophils Relative 4.9 %   Basophils Relative 0.8 %  VITAMIN D 25 Hydroxy (Vit-D Deficiency, Fractures)     Status: None   Collection Time: 05/01/18  2:18 PM  Result Value Ref Range   Vit D, 25-Hydroxy 37 30 - 100 ng/mL    Comment: Vitamin D Status         25-OH Vitamin D: . Deficiency:                    <20 ng/mL Insufficiency:             20 - 29 ng/mL Optimal:                 > or = 30 ng/mL . For 25-OH Vitamin D testing on patients on  D2-supplementation and patients for whom quantitation  of D2 and D3 fractions is required, the [facility](TM) 25-OH VIT D, (D2,D3), LC/MS/MS is recommended: order  code [phone] (patients >19yr). . For more information on this test, go to: http://education.[facility].com/faq/FAQ163 (This link is being provided for  informational/educational purposes only.)   Parathyroid hormone, intact (no Ca)     Status: None   Collection Time: 05/01/18  2:18 PM  Result Value Ref Range   PTH 39 14 - 64 pg/mL    Comment: . Interpretive Guide    Intact PTH           Calcium ------------------    ----------           ------- Normal Parathyroid    Normal                Normal Hypoparathyroidism    Low or Low Normal    Low Hyperparathyroidism    Primary            Normal or High       High    Secondary          High                 Normal or Low    Tertiary           High                 High Non-Parathyroid    Hypercalcemia      Low or Low Normal    High .   Vitamin B12     Status: None   Collection Time: 05/01/18  2:18 PM  Result Value Ref Range   Vitamin B-12 941 200 - 1,100 pg/mL  TSH     Status: None   Collection Time: 05/01/18  2:18 PM  Result Value Ref Range   TSH 1.65 0.40 - 4.50 mIU/L      [facility]9: [facility] 05/27/2018 05/01/2018 02/07/2018 01/28/2018 11/30/2017  Decreased Interest 3 3 0 3 3  Down, Depressed, Hopeless 3 3 0 3 3  PHQ - 2 Score 6 6 0 6 6  Altered sleeping 2 3 - 3 3  Tired, decreased energy 3 3 - 3 3  Change in appetite 3 3 - 3 3  Feeling bad or failure about yourself  3 3 - 3 3  Trouble concentrating 3 3 - 3 3  Moving slowly or fidgety/restless 2 1 - 3 1  Suicidal thoughts 3 2 - 2 1  PHQ-9 Score 25 24 - 26 23  Difficult doing work/chores Very difficult Extremely dIfficult -  Very difficult Somewhat difficult  Some recent data might be hidden     Fall Risk: Fall Risk  05/27/2018 05/01/2018 02/07/2018 01/28/2018 11/30/2017  Falls in the past year? No No No No No  Comment - - - - -  Number falls in past yr: - - - - -  Injury with Fall? - - - - -  Risk Factor Category  - - - - -  Comment - - - - -  Risk for fall due to : - - Impaired balance/gait - -  Risk for fall due to: Comment - - uses walker - -  Follow up - - - - -     Functional Status Survey: Is the patient deaf or have difficulty hearing?: No Does the patient have difficulty seeing, even when wearing glasses/contacts?: Yes Does the patient have difficulty concentrating, remembering, or making decisions?: No Does the patient have difficulty walking or climbing stairs?: Yes Does the patient have difficulty dressing or bathing?: No Does  the patient have difficulty doing errands alone such as visiting a doctor's office or shopping?: No    Assessment & Plan  1. MDD (major depressive disorder), recurrent episode, moderate ([place])  - Ambulatory referral to Chronic Care Management Services  2. Need for immunization against influenza  - Flu vaccine HIGH DOSE PF (Fluzone High dose)  3. Impetigo  - mupirocin ointment (BACTROBAN) 2 %; Place 1 application into the nose 2 (two) times daily.  Dispense: 22 g; Refill: 0

## 2018-05-28 ENCOUNTER — Ambulatory Visit: Payer: Self-pay | Admitting: *Deleted

## 2018-05-28 ENCOUNTER — Other Ambulatory Visit: Payer: Self-pay

## 2018-05-28 ENCOUNTER — Emergency Department: Payer: Medicare HMO

## 2018-05-28 ENCOUNTER — Emergency Department
Admission: EM | Admit: 2018-05-28 | Discharge: 2018-05-28 | Disposition: A | Discharge status: Home / Self Care | Payer: Medicare HMO | Attending: Emergency Medicine | Admitting: Emergency Medicine

## 2018-05-28 DIAGNOSIS — I5020 Unspecified systolic (congestive) heart failure: Secondary | ICD-10-CM | POA: Diagnosis not present

## 2018-05-28 DIAGNOSIS — Z79899 Other long term (current) drug therapy: Secondary | ICD-10-CM | POA: Insufficient documentation

## 2018-05-28 DIAGNOSIS — J45909 Unspecified asthma, uncomplicated: Secondary | ICD-10-CM | POA: Insufficient documentation

## 2018-05-28 DIAGNOSIS — M545 Low back pain: Secondary | ICD-10-CM | POA: Diagnosis not present

## 2018-05-28 DIAGNOSIS — N189 Chronic kidney disease, unspecified: Secondary | ICD-10-CM | POA: Insufficient documentation

## 2018-05-28 DIAGNOSIS — M5442 Lumbago with sciatica, left side: Secondary | ICD-10-CM | POA: Diagnosis not present

## 2018-05-28 DIAGNOSIS — E1122 Type 2 diabetes mellitus with diabetic chronic kidney disease: Secondary | ICD-10-CM | POA: Diagnosis not present

## 2018-05-28 DIAGNOSIS — I129 Hypertensive chronic kidney disease with stage 1 through stage 4 chronic kidney disease, or unspecified chronic kidney disease: Secondary | ICD-10-CM | POA: Insufficient documentation

## 2018-05-28 DIAGNOSIS — Z794 Long term (current) use of insulin: Secondary | ICD-10-CM | POA: Diagnosis not present

## 2018-05-28 MED ORDER — METHOCARBAMOL 500 MG PO TABS: ORAL_TABLET | ORAL | 0 refills | Status: DC

## 2018-05-28 MED ORDER — METHOCARBAMOL 500 MG PO TABS
500.0000 mg | ORAL_TABLET | Freq: Once | ORAL | Status: AC
  Administered 2018-05-28: 500 mg via ORAL
  Filled 2018-05-28: qty 1

## 2018-05-28 MED ORDER — TRAMADOL HCL 50 MG PO TABS
50.0000 mg | ORAL_TABLET | Freq: Once | ORAL | Status: AC
  Administered 2018-05-28: 50 mg via ORAL
  Filled 2018-05-28: qty 1

## 2018-05-28 MED ORDER — OXYCODONE HCL 5 MG PO TABS
5.0000 mg | ORAL_TABLET | Freq: Once | ORAL | Status: AC
  Administered 2018-05-28: 5 mg via ORAL
  Filled 2018-05-28: qty 1

## 2018-05-28 MED ORDER — OXYCODONE HCL 5 MG PO TABS: 5.0000 mg | ORAL_TABLET | Freq: Four times a day (QID) | ORAL | 0 refills | Status: DC | PRN

## 2018-05-28 NOTE — ED Triage Notes (Addendum)
Pt arrives to ED via POV. Pt states she started having pain in left lower back that is shooting down left leg into foot. Pt denies and previous back pain. Pt describes pain as "hot poker, or electric current" that comes and goes. NAD noted at this time. Pt seen at PCP for face wound, antibiotic cream. BP  Currently 113/58.  Pt does not recall any possible injury before pain started

## 2018-05-28 NOTE — ED Notes (Addendum)
Pt reports sharp shooting pain to L foot beginning last night. Sudden onset woke her from sleep. Described as sharp and shooting like and "electric shock" Unable to palpate dorsalis pedis, skin warm and dry, cap refill <3 sec. Pt preferred to remain in wheel chair rather than move to stretcher.

## 2018-05-28 NOTE — Chronic Care Management (AMB) (Signed)
  Chronic Care Management   Note  05/28/2018 Name: Summer [name] MRN: [id number] DOB: 09/13/1950  Referral received for CCM services for [name]. I attempted to contact [name] by phone but was unsuccessful and unable to leave a message for [name]. I note that [name] has presented to the ED today.   Plan: I will attempt to reach [name] by phone again over the next 24-48 hours.   [facility]  [phone]

## 2018-05-28 NOTE — ED Notes (Signed)
Pt activated call bell to report that pain was not affected by the tramadol.Reports continuing 10/10. EDP notified.

## 2018-05-28 NOTE — Discharge Instructions (Signed)
Follow-up with your primary care provider if any continued problems.  You may use ice or heat to your back as needed for discomfort.  You may also elevate your legs with 2 pillows when lying in bed at night.  This will help relieve some of the pressure on your sciatic nerve.  Discontinue taking your muscle relaxant at home.  Begin taking the Robaxin as prescribed and oxycodone 5 mg immediate release.  Do not drive or operate machinery while taking this medication as it may increase your risk for injury.  Return to the emergency department if any severe worsening of your symptoms.

## 2018-05-28 NOTE — ED Provider Notes (Signed)
[facility] Emergency Department Provider Note  ____________________________________________   First MD Initiated Contact with Patient 05/28/18 1502     (approximate)  I have reviewed the triage vital signs and the nursing notes.   HISTORY  Chief Complaint Sciatica  HPI Summer [name] is a 67 y.o. female presents to the ED with complaint of low back pain radiating over to her left leg.  Patient states that it is a sharp shooting pain that feels like electricity.  She denies any recent injury to her back.  She states that her back is never been x-rayed.  She has taken a muscle relaxant at home which is not helped.  Patient denies any urinary symptoms with this pain.  She denies any incontinence of bowel or bladder and no saddle anesthesias.  She rates her pain as a 10/10.  Past Medical History:  Diagnosis Date  . Anemia   . Arthritis   . Asthma   . Carpal tunnel syndrome   . Chronic kidney disease   . Depressive disorder   . Diabetes mellitus   . Dyspnea   . GERD (gastroesophageal reflux disease)   . Headache   . History of hiatal hernia   . Hyperlipidemia   . Hypertension   . Insomnia   . Lumbago   . Memory loss   . Obesity   . 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)   . Pneumonia   . Rhinitis, allergic   . Rosacea   . Thyroid disease   . Unspecified hearing loss   . Unspecified hereditary and idiopathic peripheral neuropathy   . Unspecified sleep apnea     Patient Active Problem List   Diagnosis Date Noted  . (HFpEF) heart failure with preserved ejection fraction ([place]) 08/31/2017  . Moderate persistent asthma 08/31/2017  . Hyperlipidemia due to type 2 diabetes mellitus ([place]) 06/14/2017  . Essential hypertension 10/06/2016  . Morbid obesity with BMI of 40.0-44.9, adult ([place]) 10/06/2016  . Dyslipidemia associated with type 2 diabetes mellitus ([place]) 08/18/2016  . Charcot foot due to diabetes mellitus ([place]) 02/22/2016  . Hypertriglyceridemia  07/27/2015  . Acquired abduction deformity of foot 07/12/2015  . Osteoarthritis of subtalar joint 07/12/2015  . Poorly controlled type 2 diabetes mellitus with neuropathy ([place]) 07/12/2015  . Arthritis of foot, degenerative 07/12/2015  . Carpal tunnel syndrome 04/17/2015  . Chronic constipation 04/17/2015  . Insomnia, persistent 04/17/2015  . Chronic kidney disease (CKD), stage III (moderate) ([place]) 04/17/2015  . Decreased exercise tolerance 04/17/2015  . Diabetes mellitus with polyneuropathy ([place]) 04/17/2015  . Gastro-esophageal reflux disease without esophagitis 04/17/2015  . Bursitis, trochanteric 04/17/2015  . Cephalalgia 04/17/2015  . Benign hypertension 04/17/2015  . Adult hypothyroidism 04/17/2015  . Hearing loss 04/17/2015  . Chronic recurrent major depressive disorder ([place]) 04/17/2015  . Neurogenic claudication 04/17/2015  . Obesity (BMI 35.0-39.9 without comorbidity) 04/17/2015  . Hypo-ovarianism 04/17/2015  . Perennial allergic rhinitis with seasonal variation 04/17/2015  . Acne erythematosa 04/17/2015  . Dyskinesia, tardive 04/17/2015  . Memory loss 04/17/2015  . Impingement syndrome of shoulder 04/17/2015  . Dermatitis, stasis 04/17/2015  . Obstructive sleep apnea 05/14/2014  . Dyspnea 05/06/2014  . Hyperlipidemia 02/06/2012  . LBP (low back pain) 09/16/2008    Past Surgical History:  Procedure Laterality Date  . ANKLE SURGERY Left approx Jan 2018  . CATARACT EXTRACTION  01/2011   right  . eye lid surgery  2013   bilateral  . FOOT SURGERY    . NECK SURGERY    .  TUBAL LIGATION    . VAGINAL HYSTERECTOMY  1989    Prior to Admission medications   Medication Sig Start Date End Date Taking? Authorizing Provider  ACCU-CHEK SOFTCLIX LANCETS lancets  08/26/17   [provider]  acetaminophen (TYLENOL 8 HOUR) 650 MG CR tablet Take 1 tablet (650 mg total) by mouth every 8 (eight) hours as needed for pain. 08/21/17   Steele Sizer, MD  albuterol (PROVENTIL  HFA) 108 (90 Base) MCG/ACT inhaler Inhale 2 puffs into the lungs 2 (two) times daily as needed for shortness of breath.     [provider]  Alcohol Swabs (B-D SINGLE USE SWABS REGULAR) PADS  01/04/18   [provider]  ASPIRIN LOW DOSE 81 MG EC tablet TAKE 1 TABLET (81 MG TOTAL) BY MOUTH DAILY. 05/31/16   Steele Sizer, MD  Blood Glucose Monitoring Suppl (ACCU-CHEK AVIVA PLUS) w/Device KIT  11/02/15   [provider]  DULoxetine (CYMBALTA) 60 MG capsule Take 1 capsule (60 mg total) by mouth daily. 05/16/18   Steele Sizer, MD  furosemide (LASIX) 20 MG tablet Take 1 tablet (20 mg total) by mouth daily. 03/12/18 06/10/18  Steele Sizer, MD  Glucose Blood (BLOOD GLUCOSE TEST STRIPS) STRP  03/11/14   [provider]  insulin regular (NOVOLIN R) 100 units/mL injection INJECT 50 UNITS SUBCTANEOUSLY 3 TIMES A DAY BEFORE MEALS, PLUS SLIDING SCALE. MDD 100 UNITS 12/03/17   [provider]  Insulin Syringe-Needle U-100 31G X 15/64" 1 ML [place] DAILY 12/27/17   [provider]  irbesartan-hydrochlorothiazide (AVALIDE) 300-12.5 MG tablet Take 1 tablet by mouth daily.    [provider]  Lancets (ACCU-CHEK SOFT TOUCH) lancets Use 1 each 4 (four) times daily 01/25/18 01/25/19  [provider]  levothyroxine (SYNTHROID, LEVOTHROID) 25 MCG tablet TAKE 1 TABLET EVERY DAY  EXCEPT TAKE 2 TABLETS  ON  SUNDAYS 01/28/18   Ancil Boozer, Drue Stager, MD  metFORMIN (GLUCOPHAGE-XR) 500 MG 24 hr tablet Take 500 mg by mouth. 2 tablets daily (total of 1000 mg daily) 12/03/17   [provider]  methocarbamol (ROBAXIN) 500 MG tablet Take 1 or 2 tablets every 6 hours as needed for muscle spasms. 05/28/18   Johnn Hai, PA-C  metoprolol tartrate (LOPRESSOR) 25 MG tablet Take 1 tablet (25 mg total) by mouth 2 (two) times daily. 05/01/18   Steele Sizer, MD  Misc Natural Products (OSTEO BI-FLEX ADV JOINT SHIELD PO) Take by mouth 2 (two) times a week.     [provider]  mometasone-formoterol (DULERA) 100-5 MCG/ACT AERO Inhale 2 puffs into the lungs 2 (two) times daily. 11/28/17   Steele Sizer, MD  montelukast (SINGULAIR) 10 MG tablet Take 1 tablet (10 mg total) by mouth at bedtime. 05/01/18   Steele Sizer, MD  mupirocin ointment (BACTROBAN) 2 % Place 1 application into the nose 2 (two) times daily. 05/27/18   Steele Sizer, MD  Omega 3 1000 MG CAPS Take 1 capsule by mouth 2 (two) times daily.    [provider]  omeprazole (PRILOSEC) 40 MG capsule Take 1 capsule (40 mg total) by mouth daily. 05/01/18   Steele Sizer, MD  oxyCODONE (ROXICODONE) 5 MG immediate release tablet Take 1 tablet (5 mg total) by mouth every 6 (six) hours as needed for moderate pain. 05/28/18 05/28/19  Johnn Hai, PA-C  Potassium 99 MG TABS Take by mouth.    [provider]  pregabalin (LYRICA) 300 MG capsule Take 2 capsules (600 mg  total) by mouth daily. 11/28/17   Steele Sizer, MD  Probiotic Product (PROBIOTIC-10 PO) Take by mouth.    [provider]  ranitidine (ZANTAC) 150 MG tablet Take 1 tablet (150 mg total) by mouth at bedtime. 04/10/18   Steele Sizer, MD  RELION INSULIN SYRINGE 1ML/31G 31G X 5/16" 1 ML MISC  12/19/15   [provider]  rosuvastatin (CRESTOR) 20 MG tablet Take 1 tablet (20 mg total) by mouth daily. 05/01/18   Steele Sizer, MD  vitamin C (ASCORBIC ACID) 500 MG tablet Take 500 mg by mouth 2 (two) times daily.     [provider]  VITAMIN D, CHOLECALCIFEROL, PO Take 2,000 Units by mouth daily.     [provider]    Allergies Codeine; Atorvastatin; Latex; and Zolpidem  Family History  Problem Relation Age of Onset  . Heart attack Mother   . Aneurysm Mother     Social History Social History   Tobacco Use  . Smoking status: Never Smoker  . Smokeless tobacco: Never Used  Substance Use Topics  . Alcohol use: No    Alcohol/week: 0.0 - 1.0 standard drinks     Frequency: Never  . Drug use: No    Review of Systems Constitutional: No fever/chills Cardiovascular: Denies chest pain. Respiratory: Denies shortness of breath. Gastrointestinal: No abdominal pain.  No nausea, no vomiting.  No diarrhea.  No constipation. Genitourinary: Negative for dysuria.  Negative for hematuria. Musculoskeletal: Positive for low back pain with left leg radiculopathy. Skin: Negative for rash. Neurological: Negative for headaches, focal weakness or numbness. ___________________________________________   PHYSICAL EXAM:  VITAL SIGNS: ED Triage Vitals  Enc Vitals Group     BP 05/28/18 1428 (!) 138/125     Pulse Rate 05/28/18 1428 76     Resp --      Temp 05/28/18 1428 97.7 F (36.5 C)     Temp Source 05/28/18 1428 Oral     SpO2 05/28/18 1428 99 %     Weight 05/28/18 1450 250 lb (113.4 kg)     Height 05/28/18 1450 '5\' 2"'$  (1.575 m)     Head Circumference --      Peak Flow --      Pain Score 05/28/18 1449 10     Pain Loc --      Pain Edu? --      Excl. in [place]? --    Constitutional: Alert and oriented. Well appearing and in no acute distress. Eyes: Conjunctivae are normal.  Head: Atraumatic. Neck: No stridor.   Cardiovascular: Normal rate, regular rhythm. Grossly normal heart sounds.  Good peripheral circulation. Respiratory: Normal respiratory effort.  No retractions. Lungs CTAB. Musculoskeletal: Examination of lower back there is no gross deformity and minimal tenderness to palpation of the L5-S1 area and paravertebral muscles to the left.  Range of motion is restricted secondary to discomfort and active muscle spasms.  Patient is able to move lower extremities without any difficulty and currently is sitting in a wheelchair.  Movement of her lower extremities increases her pain.  Patient was unable to tolerate straight leg raises. Neurologic:  Normal speech and language. No gross focal neurologic deficits are appreciated. Skin:  Skin is warm, dry and intact.  No rash noted. Psychiatric: Mood and affect are normal. Speech and behavior are normal.  ____________________________________________   LABS (all labs ordered are listed, but only abnormal results are displayed)  Labs Reviewed - No data to display   RADIOLOGY  ED MD interpretation:  Lumbar spine x-ray is negative for compression fracture  Official radiology report(s): Dg Lumbar Spine 2-3 Views  Result Date: 05/28/2018 CLINICAL DATA:  Pain in the low back shooting down to left leg EXAM: LUMBAR SPINE - 2-3 VIEW COMPARISON:  01/10/2011 FINDINGS: Five non rib-bearing lumbar type vertebra. Mild SI joint degenerative changes. Lumbar alignment within normal limits. Vertebral body heights are maintained. Moderate-to-marked diffuse degenerative changes T12 through L5 with disc space narrowing, endplate sclerosis and bony spurring. Prominent posterior facet degenerative change of the lower lumbar spine. IMPRESSION: 1. No acute osseous abnormality 2. Moderate-to-marked diffuse degenerative changes of the lumbar spine. Electronically Signed   By: Donavan Foil M.D.   On: 05/28/2018 16:13  ____________________________________________   PROCEDURES  Procedure(s) performed: None  Procedures  Critical Care performed: No ____________________________________________   INITIAL IMPRESSION / ASSESSMENT AND PLAN / ED COURSE  As part of my medical decision making, I reviewed the following data within the electronic MEDICAL RECORD NUMBER Notes from prior ED visits and [place] Controlled Substance Database  Patient presents to the ED with complaint of low back pain with radiation into her left leg.  Patient denies any injury and is denying any past history of low back problems.  She states she has never had her back x-ray.  Prior to x-ray patient was given Robaxin and tramadol.  X-rays were reassuring that there was no compression fracture causing her difficulty.  She states that the tramadol has done very little  for her back pain.  She states that she is able to take oxycodone but not the codeine.  Patient was given oxycodone 5 mg in the ED.  She is to follow-up with her PCP and she was given a prescription for methocarbamol 500 mg 1 or 2 every 6 hours as needed for muscle spasms and oxycodone 5 mg every 6 hours as needed for pain.  We also discussed ice or heat to her back as needed for comfort.   ____________________________________________   FINAL CLINICAL IMPRESSION(S) / ED DIAGNOSES  Final diagnoses:  Acute left-sided low back pain with left-sided sciatica     ED Discharge Orders         Ordered    methocarbamol (ROBAXIN) 500 MG tablet     05/28/18 1637    oxyCODONE (ROXICODONE) 5 MG immediate release tablet  Every 6 hours PRN     05/28/18 1637           Note:  This document was prepared using Dragon voice recognition software and may include unintentional dictation errors.    Johnn Hai, PA-C 05/28/18 1728    Lisa Roca, MD 05/30/18 [phone]

## 2018-05-29 ENCOUNTER — Telehealth: Payer: Self-pay

## 2018-05-29 NOTE — Telephone Encounter (Signed)
Copied from [place] [phone]. Topic: Appointment Scheduling - Scheduling Inquiry for Clinic >> May 29, 2018 11:11 AM Oliver Pila B wrote: Reason for CRM: pt was told by hospital to get a hos f/u visit w/ pcp in 2 days; pt's ER visit was yesterday; pcp has no openings for the pt and the pt is needing a Friday or Monday; contact to advise

## 2018-06-03 ENCOUNTER — Ambulatory Visit (INDEPENDENT_AMBULATORY_CARE_PROVIDER_SITE_OTHER): Payer: Medicare HMO | Admitting: Family Medicine

## 2018-06-03 ENCOUNTER — Encounter: Payer: Self-pay | Admitting: Family Medicine

## 2018-06-03 VITALS — BP 124/68 | HR 82 | Temp 98.1°F | Resp 16 | Ht 62.0 in | Wt 250.6 lb

## 2018-06-03 DIAGNOSIS — Z6841 Body Mass Index (BMI) 40.0 and over, adult: Secondary | ICD-10-CM

## 2018-06-03 DIAGNOSIS — M5442 Lumbago with sciatica, left side: Secondary | ICD-10-CM

## 2018-06-03 NOTE — Patient Instructions (Signed)
Sciatica Rehab  Ask your health care provider which exercises are safe for you. Do exercises exactly as told by your health care provider and adjust them as directed. It is normal to feel mild stretching, pulling, tightness, or discomfort as you do these exercises, but you should stop right away if you feel sudden pain or your pain gets worse. Do not begin these exercises until told by your health care provider.  Stretching and range of motion exercises  These exercises warm up your muscles and joints and improve the movement and flexibility of your hips and your back. These exercises also help to relieve pain, numbness, and tingling.  Exercise A: Sciatic nerve glide  1. Sit in a chair with your head facing down toward your chest. Place your hands behind your back. Let your shoulders slump forward.  2. Slowly straighten one of your knees while you tilt your head back as if you are looking toward the ceiling. Only straighten your leg as far as you can without making your symptoms worse.  3. Hold for __________ seconds.  4. Slowly return to the starting position.  5. Repeat with your other leg.  Repeat __________ times. Complete this exercise __________ times a day.  Exercise B: Knee to chest with hip adduction and internal rotation    1. Lie on your back on a firm surface with both legs straight.  2. Bend one of your knees and move it up toward your chest until you feel a gentle stretch in your lower back and buttock. Then, move your knee toward the shoulder that is on the opposite side from your leg.  ? Hold your leg in this position by holding onto the front of your knee.  3. Hold for __________ seconds.  4. Slowly return to the starting position.  5. Repeat with your other leg.  Repeat __________ times. Complete this exercise __________ times a day.  Exercise C: Prone extension on elbows    1. Lie on your abdomen on a firm surface. A bed may be too soft for this exercise.  2. Prop yourself up on your  elbows.  3. Use your arms to help lift your chest up until you feel a gentle stretch in your abdomen and your lower back.  ? This will place some of your body weight on your elbows. If this is uncomfortable, try stacking pillows under your chest.  ? Your hips should stay down, against the surface that you are lying on. Keep your hip and back muscles relaxed.  4. Hold for __________ seconds.  5. Slowly relax your upper body and return to the starting position.  Repeat __________ times. Complete this exercise __________ times a day.  Strengthening exercises  These exercises build strength and endurance in your back. Endurance is the ability to use your muscles for a long time, even after they get tired.  Exercise D: Pelvic tilt  1. Lie on your back on a firm surface. Bend your knees and keep your feet flat.  2. Tense your abdominal muscles. Tip your pelvis up toward the ceiling and flatten your lower back into the floor.  ? To help with this exercise, you may place a small towel under your lower back and try to push your back into the towel.  3. Hold for __________ seconds.  4. Let your muscles relax completely before you repeat this exercise.  Repeat __________ times. Complete this exercise __________ times a day.  Exercise E: Alternating arm and leg raises      1. Get on your hands and knees on a firm surface. If you are on a hard floor, you may want to use padding to cushion your knees, such as an exercise mat.  2. Line up your arms and legs. Your hands should be below your shoulders, and your knees should be below your hips.  3. Lift your left leg behind you. At the same time, raise your right arm and straighten it in front of you.  ? Do not lift your leg higher than your hip.  ? Do not lift your arm higher than your shoulder.  ? Keep your abdominal and back muscles tight.  ? Keep your hips facing the ground.  ? Do not arch your back.  ? Keep your balance carefully, and do not hold your breath.  4. Hold for  __________ seconds.  5. Slowly return to the starting position and repeat with your right leg and your left arm.  Repeat __________ times. Complete this exercise __________ times a day.  Posture and body mechanics    Body mechanics refers to the movements and positions of your body while you do your daily activities. Posture is part of body mechanics. Good posture and healthy body mechanics can help to relieve stress in your body's tissues and joints. Good posture means that your spine is in its natural S-curve position (your spine is neutral), your shoulders are pulled back slightly, and your head is not tipped forward. The following are general guidelines for applying improved posture and body mechanics to your everyday activities.  Standing    · When standing, keep your spine neutral and your feet about hip-width apart. Keep a slight bend in your knees. Your ears, shoulders, and hips should line up.  · When you do a task in which you stand in one place for a long time, place one foot up on a stable object that is 2-4 inches (5-10 cm) high, such as a footstool. This helps keep your spine neutral.  Sitting    · When sitting, keep your spine neutral and keep your feet flat on the floor. Use a footrest, if necessary, and keep your thighs parallel to the floor. Avoid rounding your shoulders, and avoid tilting your head forward.  · When working at a desk or a computer, keep your desk at a height where your hands are slightly lower than your elbows. Slide your chair under your desk so you are close enough to maintain good posture.  · When working at a computer, place your monitor at a height where you are looking straight ahead and you do not have to tilt your head forward or downward to look at the screen.  Resting    · When lying down and resting, avoid positions that are most painful for you.  · If you have pain with activities such as sitting, bending, stooping, or squatting (flexion-based activities), lie in a  position in which your body does not bend very much. For example, avoid curling up on your side with your arms and knees near your chest (fetal position).  · If you have pain with activities such as standing for a long time or reaching with your arms (extension-based activities), lie with your spine in a neutral position and bend your knees slightly. Try the following positions:  ? Lying on your side with a pillow between your knees.  ? Lying on your back with a pillow under your knees.  Lifting    · When lifting   objects, keep your feet at least shoulder-width apart and tighten your abdominal muscles.  · Bend your knees and hips and keep your spine neutral. It is important to lift using the strength of your legs, not your back. Do not lock your knees straight out.  · Always ask for help to lift heavy or awkward objects.  This information is not intended to replace advice given to you by your health care provider. Make sure you discuss any questions you have with your health care provider.  Document Released: 08/28/2005 Document Revised: 05/04/2016 Document Reviewed: 05/14/2015  [employer]vier Interactive Patient Education © 2018 [employer]

## 2018-06-03 NOTE — Progress Notes (Signed)
Name: Summer [name]   MRN: [id number]    DOB: 1951-01-25   Date:06/03/2018       Progress Note  Subjective  Chief Complaint  Chief Complaint  Patient presents with  . Follow-up    ER recheck Sciatica pain    HPI  Pt presents for ER follow up.  She was seen and treated for LEFT sided sciatica - was given Oxycodone and Methocarbomol and this seems to be helping. She notes she is about to run out of these medications.  Night time seems to be the worst for her pain - discussed proper positioning with pillows to help relieve some of the pressure.  She denies weakness in her legs.  Advised we can refill Robaxin if needed, but not the oxycodone.    Lumbar Xray from 05/28/2018 shows no acute abnormality; Moderate-to-marked diffuse degenerative changes of the lumbar spine per radiologist.  Obesity - discussed weight loss as one way to prevent recurrence; she is exploring weight loss surgery as an option right now.   Patient Active Problem List   Diagnosis Date Noted  . (HFpEF) heart failure with preserved ejection fraction ([place]) 08/31/2017  . Moderate persistent asthma 08/31/2017  . Hyperlipidemia due to type 2 diabetes mellitus ([place]) 06/14/2017  . Essential hypertension 10/06/2016  . Morbid obesity with BMI of 40.0-44.9, adult ([place]) 10/06/2016  . Dyslipidemia associated with type 2 diabetes mellitus ([place]) 08/18/2016  . Charcot foot due to diabetes mellitus ([place]) 02/22/2016  . Hypertriglyceridemia 07/27/2015  . Acquired abduction deformity of foot 07/12/2015  . Osteoarthritis of subtalar joint 07/12/2015  . Poorly controlled type 2 diabetes mellitus with neuropathy ([place]) 07/12/2015  . Arthritis of foot, degenerative 07/12/2015  . Carpal tunnel syndrome 04/17/2015  . Chronic constipation 04/17/2015  . Insomnia, persistent 04/17/2015  . Chronic kidney disease (CKD), stage III (moderate) ([place]) 04/17/2015  . Decreased exercise tolerance 04/17/2015  . Diabetes mellitus with polyneuropathy  ([place]) 04/17/2015  . Gastro-esophageal reflux disease without esophagitis 04/17/2015  . Bursitis, trochanteric 04/17/2015  . Cephalalgia 04/17/2015  . Benign hypertension 04/17/2015  . Adult hypothyroidism 04/17/2015  . Hearing loss 04/17/2015  . Chronic recurrent major depressive disorder ([place]) 04/17/2015  . Neurogenic claudication 04/17/2015  . Obesity (BMI 35.0-39.9 without comorbidity) 04/17/2015  . Hypo-ovarianism 04/17/2015  . Perennial allergic rhinitis with seasonal variation 04/17/2015  . Acne erythematosa 04/17/2015  . Dyskinesia, tardive 04/17/2015  . Memory loss 04/17/2015  . Impingement syndrome of shoulder 04/17/2015  . Dermatitis, stasis 04/17/2015  . Obstructive sleep apnea 05/14/2014  . Dyspnea 05/06/2014  . Hyperlipidemia 02/06/2012  . LBP (low back pain) 09/16/2008    Social History   Tobacco Use  . Smoking status: Never Smoker  . Smokeless tobacco: Never Used  Substance Use Topics  . Alcohol use: No    Alcohol/week: 0.0 - 1.0 standard drinks    Frequency: Never     Current Outpatient Medications:  .  ACCU-CHEK SOFTCLIX LANCETS lancets, , Disp: , Rfl:  .  acetaminophen (TYLENOL 8 HOUR) 650 MG CR tablet, Take 1 tablet (650 mg total) by mouth every 8 (eight) hours as needed for pain., Disp: 90 tablet, Rfl: 0 .  albuterol (PROVENTIL HFA) 108 (90 Base) MCG/ACT inhaler, Inhale 2 puffs into the lungs 2 (two) times daily as needed for shortness of breath. , Disp: , Rfl:  .  Alcohol Swabs (B-D SINGLE USE SWABS REGULAR) PADS, , Disp: , Rfl:  .  ASPIRIN LOW DOSE 81 MG EC tablet, TAKE 1  TABLET (81 MG TOTAL) BY MOUTH DAILY., Disp: 30 tablet, Rfl: 0 .  Blood Glucose Monitoring Suppl (ACCU-CHEK AVIVA PLUS) w/Device KIT, , Disp: , Rfl:  .  DULoxetine (CYMBALTA) 60 MG capsule, Take 1 capsule (60 mg total) by mouth daily., Disp: 90 capsule, Rfl: 0 .  furosemide (LASIX) 20 MG tablet, Take 1 tablet (20 mg total) by mouth daily., Disp: 90 tablet, Rfl: 1 .  Glucose Blood  (BLOOD GLUCOSE TEST STRIPS) STRP, , Disp: , Rfl:  .  insulin regular (NOVOLIN R) 100 units/mL injection, INJECT 50 UNITS SUBCTANEOUSLY 3 TIMES A DAY BEFORE MEALS, PLUS SLIDING SCALE. MDD 100 UNITS, Disp: , Rfl:  .  Insulin Syringe-Needle U-100 31G X 15/64" 1 ML MISC, ONE  THREE TIMES DAILY, Disp: , Rfl:  .  irbesartan-hydrochlorothiazide (AVALIDE) 300-12.5 MG tablet, Take 1 tablet by mouth daily., Disp: , Rfl:  .  Lancets (ACCU-CHEK SOFT TOUCH) lancets, Use 1 each 4 (four) times daily, Disp: , Rfl:  .  levothyroxine (SYNTHROID, LEVOTHROID) 25 MCG tablet, TAKE 1 TABLET EVERY DAY  EXCEPT TAKE 2 TABLETS  ON  SUNDAYS, Disp: 100 tablet, Rfl: 0 .  metFORMIN (GLUCOPHAGE-XR) 500 MG 24 hr tablet, Take 500 mg by mouth. 2 tablets daily (total of 1000 mg daily), Disp: , Rfl:  .  methocarbamol (ROBAXIN) 500 MG tablet, Take 1 or 2 tablets every 6 hours as needed for muscle spasms., Disp: 20 tablet, Rfl: 0 .  metoprolol tartrate (LOPRESSOR) 25 MG tablet, Take 1 tablet (25 mg total) by mouth 2 (two) times daily., Disp: 180 tablet, Rfl: 1 .  Misc Natural Products (OSTEO BI-FLEX ADV JOINT SHIELD PO), Take by mouth 2 (two) times a week., Disp: , Rfl:  .  mometasone-formoterol (DULERA) 100-5 MCG/ACT AERO, Inhale 2 puffs into the lungs 2 (two) times daily., Disp: 3 Inhaler, Rfl: 1 .  montelukast (SINGULAIR) 10 MG tablet, Take 1 tablet (10 mg total) by mouth at bedtime., Disp: 90 tablet, Rfl: 1 .  mupirocin ointment (BACTROBAN) 2 %, Place 1 application into the nose 2 (two) times daily., Disp: 22 g, Rfl: 0 .  Omega 3 1000 MG CAPS, Take 1 capsule by mouth 2 (two) times daily., Disp: , Rfl:  .  omeprazole (PRILOSEC) 40 MG capsule, Take 1 capsule (40 mg total) by mouth daily., Disp: 90 capsule, Rfl: 1 .  oxyCODONE (ROXICODONE) 5 MG immediate release tablet, Take 1 tablet (5 mg total) by mouth every 6 (six) hours as needed for moderate pain., Disp: 15 tablet, Rfl: 0 .  Potassium 99 MG TABS, Take by mouth., Disp: , Rfl:  .   pregabalin (LYRICA) 300 MG capsule, Take 2 capsules (600 mg total) by mouth daily., Disp: 180 capsule, Rfl: 1 .  Probiotic Product (PROBIOTIC-10 PO), Take by mouth., Disp: , Rfl:  .  ranitidine (ZANTAC) 150 MG tablet, Take 1 tablet (150 mg total) by mouth at bedtime., Disp: 90 tablet, Rfl: 1 .  RELION INSULIN SYRINGE 1ML/31G 31G X 5/16" 1 ML MISC, , Disp: , Rfl:  .  rosuvastatin (CRESTOR) 20 MG tablet, Take 1 tablet (20 mg total) by mouth daily., Disp: 90 tablet, Rfl: 1 .  vitamin C (ASCORBIC ACID) 500 MG tablet, Take 500 mg by mouth 2 (two) times daily. , Disp: , Rfl:  .  VITAMIN D, CHOLECALCIFEROL, PO, Take 2,000 Units by mouth daily. , Disp: , Rfl:   Allergies  Allergen Reactions  . Codeine Other (See Comments)    "TRIPPED OUT"  DIDN'T LIKE  THE WAY IT FELT  . Atorvastatin     muscle pain  . Latex Rash  . Zolpidem Other (See Comments)    Sleep walk    I personally reviewed active problem list, medication list, allergies, notes from last encounter with the patient/caregiver today.  ROS  Constitutional: Negative for fever or weight change.  Respiratory: Negative for cough and shortness of breath.   Cardiovascular: Negative for chest pain or palpitations.  Gastrointestinal: Negative for abdominal pain, no bowel changes.  Musculoskeletal: See HPI Skin: Negative for rash.  Neurological: Negative for dizziness or headache.  No other specific complaints in a complete review of systems (except as listed in HPI above).  Objective  Vitals:   06/03/18 1340  BP: 124/68  Pulse: 82  Resp: 16  Temp: 98.1 F (36.7 C)  TempSrc: Oral  SpO2: 94%  Weight: 250 lb 9.6 oz (113.7 kg)  Height: _0  (1.575 m)   Body mass index is 45.84 kg/m.  Nursing Note and Vital Signs reviewed.  Physical Exam  Constitutional: Patient appears well-developed and well-nourished. No distress.  HENT: Head: Normocephalic and atraumatic.  Cardiovascular: Normal rate, regular rhythm and normal heart  sounds.  No murmur heard. No BLE edema. Pulmonary/Chest: Effort normal and breath sounds normal. No respiratory distress. Musculoskeletal: Normal range of motion, no joint effusions. No gross deformities. LEFT low back muscles are tense with mild tenderness; no bony tenderness Neurological: Pt is alert and oriented to person, place, and time. No cranial nerve deficit. Coordination, balance, strength, speech and gait are normal.  Skin: Skin is warm and dry. No rash noted. No erythema.  Psychiatric: Patient has a normal mood and affect. behavior is normal. Judgment and thought content normal.  No results found for this or any previous visit (from the past 72 hour(s)).  Assessment & Plan  1. Acute left-sided low back pain with left-sided sciatica - Improving course; continue Robaxin PRN.  2. Morbid obesity with BMI of 40.0-44.9, adult ([place]) - Discussed importance of 150 minutes of physical activity weekly, eat two servings of fish weekly, eat one serving of tree nuts ( cashews, pistachios, pecans, almonds.Marland Kitchen) every other day, eat 6 servings of fruit/vegetables daily and drink plenty of water and avoid sweet beverages.

## 2018-06-05 ENCOUNTER — Other Ambulatory Visit: Payer: Self-pay | Admitting: Family Medicine

## 2018-06-06 ENCOUNTER — Ambulatory Visit: Payer: Self-pay | Admitting: *Deleted

## 2018-06-06 ENCOUNTER — Other Ambulatory Visit: Payer: Self-pay | Admitting: Family Medicine

## 2018-06-06 DIAGNOSIS — Z6841 Body Mass Index (BMI) 40.0 and over, adult: Principal | ICD-10-CM

## 2018-06-06 DIAGNOSIS — E1140 Type 2 diabetes mellitus with diabetic neuropathy, unspecified: Secondary | ICD-10-CM

## 2018-06-06 DIAGNOSIS — Z1231 Encounter for screening mammogram for malignant neoplasm of breast: Secondary | ICD-10-CM

## 2018-06-06 DIAGNOSIS — E1165 Type 2 diabetes mellitus with hyperglycemia: Secondary | ICD-10-CM

## 2018-06-06 NOTE — Patient Instructions (Signed)
Please bring ALL OF YOUR MEDICATIONS to your appointment. Thank you!   CCM (Chronic Care Management) Team   [place] Nurse Care Coordinator  [phone]   Ruben Reason PharmD  Clinical Pharmacist  [phone]  Summer [name] was given information about Chronic Care Management services today including:  1. CCM service includes personalized support from designated clinical staff supervised by her physician, including individualized plan of care and coordination with other care providers 2. 24/7 contact phone numbers for assistance for urgent and routine care needs. 3. Service will only be billed when office clinical staff spend 20 minutes or more in a month to coordinate care. 4. Only one practitioner may furnish and bill the service in a calendar month. 5. The patient may stop CCM services at any time (effective at the end of the month) by phone call to the office staff. 6. The patient will be responsible for cost sharing (co-pay) of up to 20% of the service fee (after annual deductible is met).  Patient agreed to services and verbal consent obtained.

## 2018-06-06 NOTE — Chronic Care Management (AMB) (Signed)
  Chronic Care Management   Note  06/06/2018 Name: Summer [name] MRN: [id number] DOB: 08/19/1951  I was able to reach Summer [name] by phone today. She was referred to the CCM program by Dr. Steele Sizer for assistance with management of chronic disease.   Goals    . "I could use help getting better control of my diabetes" (pt-stated)      Clinical Goals: Over the next 7 days, patient will meet with CCM team to establish plan of care for long term management of DM  Interventions: scheduled initial visit with patient    Plan: CCM team will meet with Summer [name] Tuesday, Oct 1 @ 11:30am.   [facility] / [place] Management  [phone]

## 2018-06-11 ENCOUNTER — Ambulatory Visit: Payer: Medicare HMO | Admitting: Pharmacist

## 2018-06-11 DIAGNOSIS — E1165 Type 2 diabetes mellitus with hyperglycemia: Secondary | ICD-10-CM

## 2018-06-11 DIAGNOSIS — E1140 Type 2 diabetes mellitus with diabetic neuropathy, unspecified: Secondary | ICD-10-CM

## 2018-06-11 DIAGNOSIS — Z6841 Body Mass Index (BMI) 40.0 and over, adult: Principal | ICD-10-CM

## 2018-06-11 NOTE — Chronic Care Management (AMB) (Signed)
Chronic Care Management   Initial Visit Note  06/11/2018 Name: Summer [name] MRN: [id number] DOB: 01/23/1951  Referred by: Steele Sizer, MD Reason for referral : Chronic Care Management (DM, HTN, Weight Loss Surgery Plans)  Subjective: "I really want to lose weight because the doctor said it would help with my diabetes and blood pressure"  Objective: Lab Results  Component Value Date   HGBA1C 9.8 (A) 05/01/2018   HGBA1C 9.6 (H) 01/28/2018   HGBA1C 8.8 11/02/2017   Lab Results  Component Value Date   MICROALBUR 0.6 01/28/2018   [place]  05/01/2018   CREATININE 1.47 (H) 05/01/2018   BP Readings from Last 3 Encounters:  06/03/18 124/68  05/28/18 (!) 126/58  05/27/18 (!) 128/58    Assessment: Summer [name] is a 67 year old female primary care patient of Dr. Steele Sizer who was referred to the CCM program for assistance with chronic disease management, care coordination, and medication management.   Summer [name] is being seen in the [facility] for the first time today.   Goals Addressed    . "I need help coordinating with the weight loss surgery team" (pt-stated)       Discussed patient's recent submission of requested documentation for evaluation for bariatric surgery with [facility] team. We briefly discussed anticipated evaluation process for bariatric surgery evaluation.   Clinical Goals: Over the next 7 days, patient will verbalize understanding of bariatric surgery evaluation/appointment process.   Interventions: Outreach to [employer] [facility] office [phone])     . "I need help staying on track with my diabetes" (pt-stated)       Patient states she is an endocrinology patient of Dr. Honor Junes and is attending her scheduled appointments. Her most recent HgA1C is 9.8 and slowly increasing as outlined above. Summer [name] says she is very confused about what she should and shouldn't eat because she has been give so much information and she feels some  of it is conflicting. She has not been checking her cbg as regularly as is recommended because she feels somewhat defeated and unable to affect her cbg's and therefore would "rather not see it".   Clinical Goals: Over the next 14 days, patient will verbalize compliance with recommended daily cbg check/documentation and will verbalize self assessed progress with adherence to carb modified diet changes as reviewed in CCM visit.   Interventions: Detailed carb modified dietary education provided including plate method and review of macronutrients and food choices     Plan: I will follow up with Summer [name] when I hear from [facility] team about any needed next steps. I will follow up with Summer [name] by phone over the next 14 days to assess her progress with adherence to cbg checks and carb modified dietary adherence/changes.   Summer [name] was given information about Chronic Care Management services today including:  1. CCM service includes personalized support from designated clinical staff supervised by her physician, including individualized plan of care and coordination with other care providers 2. 24/7 contact phone numbers for assistance for urgent and routine care needs. 3. Service will only be billed when office clinical staff spend 20 minutes or more in a month to coordinate care. 4. Only one practitioner may furnish and bill the service in a calendar month. 5. The patient may stop CCM services at any time (effective at the end of the month) by phone call to the office staff. 6. The patient will be responsible for cost  sharing (co-pay) of up to 20% of the service fee (after annual deductible is met).  Patient agreed to services and verbal consent obtained.  [facility] / [place] Management  [phone]

## 2018-06-11 NOTE — Patient Instructions (Addendum)
1. Add a small meal earlier in the day each day. (Examples: yogurt, small piece of fruit with cheese, hard boiled eggs)  2. Review the materials from your class for ideas about food choices. Don't forget the healthy fats (olive oil, peanut butter, small piece of cheese, etc...)  3. Slow Down and eat slowly  4. Portion Sizes Count  5. . Try to use the plate method to determine how much of which foods (see examples below)   Diabetes Mellitus and Nutrition When you have diabetes (diabetes mellitus), it is very important to have healthy eating habits because your blood sugar (glucose) levels are greatly affected by what you eat and drink. Eating healthy foods in the appropriate amounts, at about the same times every day, can help you:  Control your blood glucose.  Lower your risk of heart disease.  Improve your blood pressure.  Reach or maintain a healthy weight.  Every person with diabetes is different, and each person has different needs for a meal plan. Your health care provider may recommend that you work with a diet and nutrition specialist (dietitian) to make a meal plan that is best for you. Your meal plan may vary depending on factors such as:  The calories you need.  The medicines you take.  Your weight.  Your blood glucose, blood pressure, and cholesterol levels.  Your activity level.  Other health conditions you have, such as heart or kidney disease.  How do carbohydrates affect me? Carbohydrates affect your blood glucose level more than any other type of food. Eating carbohydrates naturally increases the amount of glucose in your blood. Carbohydrate counting is a method for keeping track of how many carbohydrates you eat. Counting carbohydrates is important to keep your blood glucose at a healthy level, especially if you use insulin or take certain oral diabetes medicines. It is important to know how many carbohydrates you can safely have in each meal. This is different  for every person. Your dietitian can help you calculate how many carbohydrates you should have at each meal and for snack. Foods that contain carbohydrates include:  Bread, cereal, rice, pasta, and crackers.  Potatoes and corn.  Peas, beans, and lentils.  Milk and yogurt.  Fruit and juice.  Desserts, such as cakes, cookies, ice cream, and candy.  How does alcohol affect me? Alcohol can cause a sudden decrease in blood glucose (hypoglycemia), especially if you use insulin or take certain oral diabetes medicines. Hypoglycemia can be a life-threatening condition. Symptoms of hypoglycemia (sleepiness, dizziness, and confusion) are similar to symptoms of having too much alcohol. If your health care provider says that alcohol is safe for you, follow these guidelines:  Limit alcohol intake to no more than 1 drink per day for nonpregnant women and 2 drinks per day for men. One drink equals 12 oz of beer, 5 oz of wine, or 1 oz of hard liquor.  Do not drink on an empty stomach.  Keep yourself hydrated with water, diet soda, or unsweetened iced tea.  Keep in mind that regular soda, juice, and other mixers may contain a lot of sugar and must be counted as carbohydrates.  What are tips for following this plan? Reading food labels  Start by checking the serving size on the label. The amount of calories, carbohydrates, fats, and other nutrients listed on the label are based on one serving of the food. Many foods contain more than one serving per package.  Check the total grams (g) of carbohydrates  in one serving. You can calculate the number of servings of carbohydrates in one serving by dividing the total carbohydrates by 15. For example, if a food has 30 g of total carbohydrates, it would be equal to 2 servings of carbohydrates.  Check the number of grams (g) of saturated and trans fats in one serving. Choose foods that have low or no amount of these fats.  Check the number of milligrams (mg)  of sodium in one serving. Most people should limit total sodium intake to less than 2,300 mg per day.  Always check the nutrition information of foods labeled as "low-fat" or "nonfat". These foods may be higher in added sugar or refined carbohydrates and should be avoided.  Talk to your dietitian to identify your daily goals for nutrients listed on the label. Shopping  Avoid buying canned, premade, or processed foods. These foods tend to be high in fat, sodium, and added sugar.  Shop around the outside edge of the grocery store. This includes fresh fruits and vegetables, bulk grains, fresh meats, and fresh dairy. Cooking  Use low-heat cooking methods, such as baking, instead of high-heat cooking methods like deep frying.  Cook using healthy oils, such as olive, canola, or sunflower oil.  Avoid cooking with butter, cream, or high-fat meats. Meal planning  Eat meals and snacks regularly, preferably at the same times every day. Avoid going long periods of time without eating.  Eat foods high in fiber, such as fresh fruits, vegetables, beans, and whole grains. Talk to your dietitian about how many servings of carbohydrates you can eat at each meal.  Eat 4-6 ounces of lean protein each day, such as lean meat, chicken, fish, eggs, or tofu. 1 ounce is equal to 1 ounce of meat, chicken, or fish, 1 egg, or 1/4 cup of tofu.  Eat some foods each day that contain healthy fats, such as avocado, nuts, seeds, and fish. Lifestyle   Check your blood glucose regularly.  Exercise at least 30 minutes 5 or more days each week, or as told by your health care provider.  Take medicines as told by your health care provider.  Do not use any products that contain nicotine or tobacco, such as cigarettes and e-cigarettes. If you need help quitting, ask your health care provider.  Work with a Social worker or diabetes educator to identify strategies to manage stress and any emotional and social challenges. What  are some questions to ask my health care provider?  Do I need to meet with a diabetes educator?  Do I need to meet with a dietitian?  What number can I call if I have questions?  When are the best times to check my blood glucose? Where to find more information:  American Diabetes Association: diabetes.org/food-and-fitness/food  Academy of Nutrition and Dietetics: PokerClues.dk  [employer] of Diabetes and Digestive and Kidney Diseases (NIH): ContactWire.be Summary  A healthy meal plan will help you control your blood glucose and maintain a healthy lifestyle.  Working with a diet and nutrition specialist (dietitian) can help you make a meal plan that is best for you.  Keep in mind that carbohydrates and alcohol have immediate effects on your blood glucose levels. It is important to count carbohydrates and to use alcohol carefully. This information is not intended to replace advice given to you by your health care provider. Make sure you discuss any questions you have with your health care provider. Document Released: 05/25/2005 Document Revised: 10/02/2016 Document Reviewed: 10/02/2016 Elsevier Interactive Patient  Education  2018 [place] (nurse) will get in touch with the [employer] team to collaborate about your bariatrics referral.   7. Summer [name] (pharmacist) will get in touch with Dr. Honor [name] about your insulin   CCM (Chronic Care Management) Team    Summer [name] [facility] Nurse Care Coordinator  [phone]   Summer [name] PharmD  Clinical Pharmacist  [phone]    Summer [name] was given information about Chronic Care Management services today including:  1. CCM service includes personalized support from designated clinical staff supervised by her physician, including individualized plan of care and coordination  with other care providers 2. 24/7 contact phone numbers for assistance for urgent and routine care needs. 3. Service will only be billed when office clinical staff spend 20 minutes or more in a month to coordinate care. 4. Only one practitioner may furnish and bill the service in a calendar month. 5. The patient may stop CCM services at any time (effective at the end of the month) by phone call to the office staff. 6. The patient will be responsible for cost sharing (co-pay) of up to 20% of the service fee (after annual deductible is met).  Patient agreed to services and verbal consent obtained.

## 2018-06-12 ENCOUNTER — Ambulatory Visit: Payer: Self-pay | Admitting: *Deleted

## 2018-06-12 DIAGNOSIS — Z6841 Body Mass Index (BMI) 40.0 and over, adult: Principal | ICD-10-CM

## 2018-06-12 DIAGNOSIS — E1140 Type 2 diabetes mellitus with diabetic neuropathy, unspecified: Secondary | ICD-10-CM

## 2018-06-12 DIAGNOSIS — E1165 Type 2 diabetes mellitus with hyperglycemia: Secondary | ICD-10-CM

## 2018-06-12 NOTE — Progress Notes (Signed)
  Chronic Care Management   Note  06/12/2018 Name: Summer [name] MRN: [id number] DOB: 21-Jan-1951  I reached out to Summer [name] today to follow up on my conversation with [facility].   Goals Addressed    . "I need help coordinating with the bariatric surgery program" (pt-stated)       During our office visit yesterday, Summer [name] indicated that she had been advised by her endocrinologist to research bariatric surgery options. She contacted [employer] program and completed requested initial paperwork.  Clinical Goals: Over the next 7 days, patient will report receipt of contact from [facility] re: an initial appointment/evaluation.  Interventions:  I called [facility] [employer] office [phone]) today and offered my assistance should additional information from the PCP office be needed and inquired about the anticipated wait time for an initial appointment. I contacted Summer [name] by phone to provide updated information. She should hear from the clinic by the end of this week.     Plan: I will follow up with Summer [name] over the next 7-10 days re: [place] referral and her DM management.   [facility] / [place] Management  [phone]

## 2018-06-12 NOTE — Chronic Care Management (AMB) (Signed)
Chronic Care Management   Note  06/12/2018 Name: Summer [name] MRN: [id number] DOB: 03-10-1951   Subjective:  Summer [name] is a 67 y.o. year old female who sees Steele Sizer, MD for primary care.   Objective:  Medications Reviewed Today    Reviewed by Cathi Roan, [facility] (Pharmacist) on 06/11/18 at 1201  Med List Status: <None>  Medication Order Taking? Sig Documenting Provider Last Dose Status Informant  ACCU-CHEK SOFTCLIX LANCETS lancets [id number] Yes  [provider] Taking Active   acetaminophen (TYLENOL 8 HOUR) 650 MG CR tablet [id number] Yes Take 1 tablet (650 mg total) by mouth every 8 (eight) hours as needed for pain. Steele Sizer, MD Taking Active Self  albuterol (PROVENTIL HFA) 108 (90 Base) MCG/ACT inhaler [id number] Yes Inhale 2 puffs into the lungs 2 (two) times daily as needed for shortness of breath.  [provider] Taking Active   Alcohol Swabs (B-D SINGLE USE SWABS REGULAR) PADS [id number] Yes  [provider] Taking Active   ASPIRIN LOW DOSE 81 MG EC tablet [id number] Yes TAKE 1 TABLET (81 MG TOTAL) BY MOUTH DAILY. Steele Sizer, MD Taking Active   Blood Glucose Monitoring Suppl (ACCU-CHEK AVIVA PLUS) w/Device Drucie Opitz [id number] Yes  [provider] Taking Active            Med Note Gwenith Daily, LATISHA A   Tue Jan 04, 2016  2:00 PM) Received from: External Pharmacy  DULoxetine (CYMBALTA) 60 MG capsule [id number] Yes Take 1 capsule (60 mg total) by mouth daily. Steele Sizer, MD Taking Active   furosemide (LASIX) 20 MG tablet [id number]  Take 1 tablet (20 mg total) by mouth daily. Steele Sizer, MD  Expired 06/10/18 2359   Glucose Blood (BLOOD GLUCOSE TEST STRIPS) STRP [id number] Yes  [provider] Taking Active            Med Note Wynetta Emery, TIFFANY L   Tue Jul 20, 2015 10:50 AM) Received from: [facility]  insulin regular (NOVOLIN R) 100 units/mL injection [id number] Yes INJECT 50 UNITS SUBCTANEOUSLY 3 TIMES A DAY  BEFORE MEALS, PLUS SLIDING SCALE. MDD 100 UNITS [provider] Taking Active            Med Note Bertell Maria, KIMBERLY A   Thu Jan 03, 2018  7:56 AM) Sliding scale with each meal - IF 200-250 add 4 units, IF 251-300 add 8 units, IF 301-350 add 12 units,IF 351-400 add 16 units , IF over 400 add 20 units.   irbesartan-hydrochlorothiazide (AVALIDE) 300-12.5 MG tablet [id number] Yes Take 1 tablet by mouth daily. [provider] Taking Active   levothyroxine (SYNTHROID, LEVOTHROID) 25 MCG tablet [id number] Yes TAKE 1 TABLET EVERY DAY  EXCEPT TAKE 2 TABLETS  ON  Luetta Nutting, Drue Stager, MD Taking Active   metFORMIN (GLUCOPHAGE-XR) 500 MG 24 hr tablet [id number] Yes Take 500 mg by mouth. 2 tablets daily (total of 1000 mg daily) [provider] Taking Active   methocarbamol (ROBAXIN) 500 MG tablet [id number] No Take 1 or 2 tablets every 6 hours as needed for muscle spasms.  Patient not taking:  Reported on 06/11/2018   Johnn Hai, PA-C Not Taking Active   metoprolol tartrate (LOPRESSOR) 25 MG tablet [id number] Yes Take 1 tablet (25 mg total) by mouth 2 (two) times daily. Steele Sizer, MD Taking Active   Misc Natural Products (OSTEO BI-FLEX ADV JOINT SHIELD PO) [id number] Yes Take by mouth 2 (two) times a week. [provider] Taking Active  mometasone-formoterol (DULERA) 100-5 MCG/ACT AERO [id number] No Inhale 2 puffs into the lungs 2 (two) times daily.  Patient not taking:  Reported on 06/11/2018   Steele Sizer, MD Not Taking Active   montelukast (SINGULAIR) 10 MG tablet [id number] Yes Take 1 tablet (10 mg total) by mouth at bedtime. Steele Sizer, MD Taking Active   mupirocin ointment (BACTROBAN) 2 % [id number] Yes Place 1 application into the nose 2 (two) times daily. Steele Sizer, MD Taking Active   Omega 3 1000 MG CAPS [id number] Yes Take 1 capsule by mouth 2 (two) times daily. [provider] Taking Active   omeprazole (PRILOSEC) 40 MG capsule [id number]  Yes Take 1 capsule (40 mg total) by mouth daily. Steele Sizer, MD Taking Active   oxyCODONE (ROXICODONE) 5 MG immediate release tablet [id number] No Take 1 tablet (5 mg total) by mouth every 6 (six) hours as needed for moderate pain.  Patient not taking:  Reported on 06/11/2018   Johnn Hai, PA-C Not Taking Active   Potassium 99 MG TABS [id number] Yes Take by mouth. [provider] Taking Active   pregabalin (LYRICA) 300 MG capsule [id number] Yes Take 2 capsules (600 mg total) by mouth daily. Steele Sizer, MD Taking Active   Probiotic Product (PROBIOTIC-10 PO) [id number] Yes Take by mouth. [provider] Taking Active   ranitidine (ZANTAC) 150 MG tablet [id number] Yes Take 1 tablet (150 mg total) by mouth at bedtime. Steele Sizer, MD Taking Active   RELION INSULIN SYRINGE 1ML/31G 31G X 5/16" 1 ML MISC [id number] Yes  [provider] Taking Active            Med Note Gwenith Daily, LATISHA A   Tue Jan 04, 2016  2:00 PM) Received from: External Pharmacy  rosuvastatin (CRESTOR) 20 MG tablet [id number] Yes Take 1 tablet (20 mg total) by mouth daily. Steele Sizer, MD Taking Active   vitamin C (ASCORBIC ACID) 500 MG tablet [id number] Yes Take 500 mg by mouth 2 (two) times daily.  [provider] Taking Active   VITAMIN D, CHOLECALCIFEROL, PO [id number] Yes Take 1,000 Units by mouth daily.  [provider] Taking Active           Goals Addressed            This Visit's Progress              . COMPLETED: "I need help coordinating with the weight loss surgery team" (pt-stated)       Discussed patient's recent submission of requested documentation for evaluation for bariatric surgery with [facility] team. We briefly discussed anticipated evaluation process for bariatric surgery evaluation.   Clinical Goals: Over the next 7 days, patient will verbalize understanding of bariatric surgery evaluation/appointment process.   Interventions: Outreach to [facility] office [phone])     . "I need help staying on track with my diabetes" (pt-stated)       Patient states she is an endocrinology patient of Dr. Honor Junes and is attending her scheduled appointments. Her most recent HgA1C is 9.8 and slowly increasing as outlined above. Ms. Schreurs says she is very confused about what she should and shouldn't eat because she has been give so much information and she feels some of it is conflicting. She has not been checking her cbg as regularly as is recommended because she feels somewhat defeated and unable to affect her cbg's and therefore would "rather not see it".   Clinical Goals: Over the next  14 days, patient will verbalize compliance with recommended daily cbg check/documentation and will verbalize self assessed progress with adherence to carb modified diet changes as reviewed in CCM visit.   Interventions: Detailed carb modified dietary education provided including plate method and review of macronutrients and food choices                         Assessment: Patient is motivated to gain control of her diabetes in order to qualify for bariatric surgery. Current medication regimen reflects good adherence, though perhaps there are better options for insulin/diabetes regimen. Cost is an issue for patient.   Plan: Contact Dr. Honor Junes at [facility] to share updated tier/drug pricing information for insulins and diabetes medications.   Follow up: CCM Pharmacist will follow up with patient's endocrinologist in 1 week, patient will follow up with CCM RNCM as needed.   Ruben Reason, PharmD Clinical Pharmacist [facility] [phone]

## 2018-06-19 DIAGNOSIS — E119 Type 2 diabetes mellitus without complications: Secondary | ICD-10-CM | POA: Diagnosis not present

## 2018-06-19 LAB — HM DIABETES EYE EXAM

## 2018-06-20 ENCOUNTER — Ambulatory Visit: Payer: Self-pay | Admitting: *Deleted

## 2018-06-20 ENCOUNTER — Ambulatory Visit: Payer: Self-pay

## 2018-06-20 DIAGNOSIS — Z6841 Body Mass Index (BMI) 40.0 and over, adult: Principal | ICD-10-CM

## 2018-06-20 DIAGNOSIS — E1140 Type 2 diabetes mellitus with diabetic neuropathy, unspecified: Secondary | ICD-10-CM

## 2018-06-20 DIAGNOSIS — E1165 Type 2 diabetes mellitus with hyperglycemia: Secondary | ICD-10-CM

## 2018-06-20 NOTE — Chronic Care Management (AMB) (Signed)
  Chronic Care Management   Follow Up Note   06/20/2018 Name: Summer [name] MRN: [id number] DOB: 04/20/1951  Referred by: Steele Sizer, MD Reason for referral : Chronic Care Management (f/u DM and Bariatric Surgery)  Assessment: Summer [name] is a 67 year old female primary care patient of Dr. Steele Sizer who was referred to the [facility] program for assistance with chronic disease management, care coordination, and medication management.   Goals    . "I need help coordinating with the bariatric surgery program" (pt-stated)     During our last office visit, Summer [name] indicated that she had been advised by her endocrinologist to research bariatric surgery options. She contacted [employer] program and completed requested initial paperwork.  Clinical Goals: Over the next 7 days, patient will report receipt of contact from [facility] re: an initial appointment/evaluation.  Interventions:  Outreach to confirm with Summer [name] her appointment at [facility] Bariatric clinic on 07/03/18 to discuss bariatric surgery options. Scheduled follow up call after 23rd.       Marland Kitchen "I need help staying on track with my diabetes" (pt-stated)     Patient states she is an endocrinology patient of Dr. Honor Junes and is attending her scheduled appointments. Her most recent HgA1C is 9.8 and slowly increasing as outlined above. Summer [name] says she is very confused about what she should and shouldn't eat because she has been give so much information and she feels some of it is conflicting. She has not been checking her cbg as regularly as is recommended because she feels somewhat defeated and unable to affect her cbg's and therefore would "rather not see it".   Clinical Goals: Over the next 14 days, patient will verbalize compliance with recommended daily cbg check/documentation and will verbalize self assessed progress with adherence to carb modified diet changes as reviewed in CCM visit.   Interventions: Detailed carb  modified dietary education provided including plate method and review of macronutrients and food choices. Emmi educational materials supplied      Plan: I will follow up with Summer [name] after 07/03/18 to follow up on her bariatric surgery appointment.    [facility] [place] Management  [phone]

## 2018-06-21 ENCOUNTER — Encounter: Payer: Self-pay | Admitting: Family Medicine

## 2018-06-25 DIAGNOSIS — N183 Chronic kidney disease, stage 3 (moderate): Secondary | ICD-10-CM | POA: Diagnosis not present

## 2018-06-25 DIAGNOSIS — E1122 Type 2 diabetes mellitus with diabetic chronic kidney disease: Secondary | ICD-10-CM | POA: Diagnosis not present

## 2018-06-25 DIAGNOSIS — I10 Essential (primary) hypertension: Secondary | ICD-10-CM | POA: Diagnosis not present

## 2018-06-26 ENCOUNTER — Other Ambulatory Visit: Payer: Self-pay | Admitting: Nephrology

## 2018-06-26 DIAGNOSIS — N183 Chronic kidney disease, stage 3 unspecified: Secondary | ICD-10-CM

## 2018-06-27 ENCOUNTER — Ambulatory Visit: Payer: Self-pay | Admitting: Pharmacist

## 2018-06-27 NOTE — Chronic Care Management (AMB) (Signed)
  Chronic Care Management   Note  06/27/2018 Name: Summer [name] MRN: [id number] DOB: [birth year]    Care Management Note    67 y.o. year old female referred to Chronic Care Management by Dr. Ancil Boozer for assistance with chronic disease. Last office visit with Steele Sizer, MD was 06/03/18.   Was unable to reach patient via telephone today and voicemail was full. (unsuccessful outreach #1).  Following up with patient to discuss diabetes medications and CBGs.   Plan: Will follow-up within 3-5  business days via telephone.   Ruben Reason, PharmD Clinical Pharmacist [facility] [phone]

## 2018-06-28 ENCOUNTER — Ambulatory Visit
Admission: RE | Admit: 2018-06-28 | Discharge: 2018-06-28 | Disposition: A | Discharge status: Home / Self Care | Payer: Medicare HMO | Source: Ambulatory Visit | Attending: Nephrology | Admitting: Nephrology

## 2018-06-28 ENCOUNTER — Ambulatory Visit
Admission: RE | Admit: 2018-06-28 | Discharge: 2018-06-28 | Disposition: A | Discharge status: Home / Self Care | Payer: 59 | Source: Ambulatory Visit | Attending: Family Medicine | Admitting: Family Medicine

## 2018-06-28 DIAGNOSIS — Z87448 Personal history of other diseases of urinary system: Secondary | ICD-10-CM | POA: Diagnosis not present

## 2018-06-28 DIAGNOSIS — N183 Chronic kidney disease, stage 3 unspecified: Secondary | ICD-10-CM

## 2018-06-28 DIAGNOSIS — Z1231 Encounter for screening mammogram for malignant neoplasm of breast: Secondary | ICD-10-CM

## 2018-07-09 ENCOUNTER — Telehealth: Payer: Medicare HMO

## 2018-07-09 ENCOUNTER — Ambulatory Visit (INDEPENDENT_AMBULATORY_CARE_PROVIDER_SITE_OTHER): Payer: Medicare HMO

## 2018-07-09 DIAGNOSIS — Z6841 Body Mass Index (BMI) 40.0 and over, adult: Principal | ICD-10-CM

## 2018-07-09 DIAGNOSIS — E1165 Type 2 diabetes mellitus with hyperglycemia: Secondary | ICD-10-CM | POA: Diagnosis not present

## 2018-07-09 DIAGNOSIS — E1140 Type 2 diabetes mellitus with diabetic neuropathy, unspecified: Secondary | ICD-10-CM | POA: Diagnosis not present

## 2018-07-09 NOTE — Chronic Care Management (AMB) (Signed)
Chronic Care Management   Follow Up Note   07/09/2018 Name: Summer [name] MRN: [id number] DOB: 08-Apr-1951  Referred by: Steele Sizer, MD Reason for referral : Chronic Care Management (follow up DM/bariatric surgery consult)    Subjective: "I am ready to loose this weight"   Objective:  Lab Results  Component Value Date   HGBA1C 9.8 (A) 05/01/2018   Wt Readings from Last 3 Encounters:  06/03/18 250 lb 9.6 oz (113.7 kg)  05/28/18 250 lb (113.4 kg)  05/27/18 248 lb 12.8 oz (112.9 kg)   Assessment: Summer [name] is a 67 year old female primary care patient of Dr. Steele Sizer who was referred to the CCM program for assistance with chronic disease management, care coordination, and medication management.   Goals Addressed    . COMPLETED: "I need help coordinating with the bariatric surgery program" (pt-stated)       Summer [name] completed her consultation appointment at [facility] on 10/23. The plan is for her to begin the weight loss program through the [facility] beginning in January. She is very motivated and appreciative of any emotional support she can receive from the CCM team during this journey.  Clinical Goals: Summer [name] will reach out to the CCM team for support and education during her weight loss/bariactric surgery journey.  Interventions: Provided [name] with the CCM team contact information and encouraged her to call as needed.      . COMPLETED: "I need help coordinating with the weight loss surgery team" (pt-stated)       Discussed patient's recent submission of requested documentation for evaluation for bariatric surgery with [facility] team. We briefly discussed anticipated evaluation process for bariatric surgery evaluation.   Clinical Goals: Over the next 7 days, patient will verbalize understanding of bariatric surgery evaluation/appointment process.   Interventions: Outreach to [employer] office [phone])      . "I need help staying on track with my diabetes" (pt-stated)       Patient states she is an endocrinology patient of Dr. Honor Junes and is attending her scheduled appointments. Her most recent HgA1C is 9.8 and slowly increasing as outlined above. Summer [name] states she is doing better with monitoring her sugars twice daily AC before insulin administration. Reports this mornings CBG was 168 which "is much better than it has been". Yesterdays fasting AM CBG was 116. Patient states she continues to attempt to follow a diabetic diet however she would like written dietary. information if possible.  Clinical Goals: Over the next 21 days, patient will verbalize compliance with recommended daily cbg check/documentation and will verbalize self assessed progress with adherence to carb modified diet changes as reviewed in CCM follow-up phone call.  Interventions: Detailed carb modified dietary education reinforced including plate method and food choices. Emmi educational materials supplied on "Diabetic Meal Planning"     . "I need motivation to get back in the gym" (pt-stated)       Patient states she would like to begin an exercise program again. She has a membership to [employer] however she has not utilized [employer] is a while. She understands establishing an exercise routine is an importance step in her weight loss journey.   Clinical Goals: Over the next 21 days, patient will verbalize weekly exercise routine initiation  Interventions: RN CM discussed with patient importance of exercise as it related to weight loss and reduction of blood glucose levels and overall improvement  of cardiovascular health and wellbeing.     Plan: CCM CM will follow up with patient in 3 weeks to assess exercise goal and DM management.    Myers Tutterow E. Rollene Rotunda, RN, BSN Nurse Care Coordinator [facility] Care Management  [phone]

## 2018-07-09 NOTE — Patient Instructions (Signed)
1. Continue to check your blood sugars twice daily as directed by your PCP 2. Continue to follow the ADA diet to the best of your ability. You are doing a great job!! 3. Continue to take all medications as directed 4. Get back in the gym over the next several weeks and don't set un-realistic goals for yourself 5. Contact the CCM team as needed for additional support and chronic care management as needed. The team will reach out to you over the next 30 days.  CCM (Chronic Care Management) Team   Trish Fountain RN, BSN Nurse Care Coordinator  [phone]  Ruben Reason PharmD  Clinical Pharmacist  [phone]

## 2018-07-10 ENCOUNTER — Other Ambulatory Visit: Payer: Self-pay | Admitting: Family Medicine

## 2018-07-10 DIAGNOSIS — I10 Essential (primary) hypertension: Secondary | ICD-10-CM

## 2018-07-10 NOTE — Telephone Encounter (Signed)
Refill request for general medication. Metoprolol and Montelukast to [facility]. Was using [facility] however recently switched to [place] and they new prescriptions sent to them.   Last office visit: 05/27/2018   Follow up on 07/26/2018

## 2018-07-24 ENCOUNTER — Encounter: Payer: Self-pay | Admitting: Family Medicine

## 2018-07-24 ENCOUNTER — Ambulatory Visit (INDEPENDENT_AMBULATORY_CARE_PROVIDER_SITE_OTHER): Payer: Medicare HMO | Admitting: Family Medicine

## 2018-07-24 VITALS — BP 118/60 | HR 96 | Temp 97.4°F | Resp 18 | Ht 62.0 in | Wt 253.4 lb

## 2018-07-24 DIAGNOSIS — L98491 Non-pressure chronic ulcer of skin of other sites limited to breakdown of skin: Secondary | ICD-10-CM | POA: Diagnosis not present

## 2018-07-24 DIAGNOSIS — Z6841 Body Mass Index (BMI) 40.0 and over, adult: Secondary | ICD-10-CM | POA: Diagnosis not present

## 2018-07-24 NOTE — Progress Notes (Signed)
Name: Summer [name]   MRN: [id number]    DOB: 1951-07-10   Date:07/24/2018       Progress Note  Subjective  Chief Complaint  Chief Complaint  Patient presents with  . Medical Clearance    patient is her for surgical clearance for Bariatric Surgery  . Skin Problem    patient stated that the area on her face has not healed    HPI  Obesity: patient has long history of obesity that has gotten progressively worse when she developed charcot foot a few years ago and was unable to move as much. She has been referred to dietician and has followed a healthier diet, she has been keeping a log of her intake and weight at home for about one year but unable to lose weight. She cannot afford weight watchers or protein shakes. She is drinking mostly water, she was advised by Endocrinologist - Dr. Honor Junes and also Pulmonologist  - Dr. Mortimer Fries to lose weight and that bariatric surgery is a good option for her. She is here today to have paperwork filled out so she can proceed with bariatric surgery. She has multiple medical problems that would improve with significant weight loss such as : DM, HTN, hyperlipidemia, OSA, pain on legs from OA. She would also likely start to breath better. " I want to feel better, I want to be able to clean my house and take care of my garden"  Skin ulcer: she picks on her skin and has a lesion on chin that is not healing, she has mupirocin, advised to keep area moist , seems to be healing and no signs of infection    Patient Active Problem List   Diagnosis Date Noted  . (HFpEF) heart failure with preserved ejection fraction ([place]) 08/31/2017  . Moderate persistent asthma 08/31/2017  . Hyperlipidemia due to type 2 diabetes mellitus ([place]) 06/14/2017  . Essential hypertension 10/06/2016  . Morbid obesity with BMI of 40.0-44.9, adult ([place]) 10/06/2016  . Dyslipidemia associated with type 2 diabetes mellitus ([place]) 08/18/2016  . Charcot foot due to diabetes mellitus ([place]) 02/22/2016  .  Hypertriglyceridemia 07/27/2015  . Acquired abduction deformity of foot 07/12/2015  . Osteoarthritis of subtalar joint 07/12/2015  . Poorly controlled type 2 diabetes mellitus with neuropathy ([place]) 07/12/2015  . Arthritis of foot, degenerative 07/12/2015  . Carpal tunnel syndrome 04/17/2015  . Chronic constipation 04/17/2015  . Insomnia, persistent 04/17/2015  . Chronic kidney disease (CKD), stage III (moderate) ([place]) 04/17/2015  . Decreased exercise tolerance 04/17/2015  . Diabetes mellitus with polyneuropathy ([place]) 04/17/2015  . Gastro-esophageal reflux disease without esophagitis 04/17/2015  . Bursitis, trochanteric 04/17/2015  . Cephalalgia 04/17/2015  . Benign hypertension 04/17/2015  . Adult hypothyroidism 04/17/2015  . Hearing loss 04/17/2015  . Chronic recurrent major depressive disorder ([place]) 04/17/2015  . Neurogenic claudication 04/17/2015  . Obesity (BMI 35.0-39.9 without comorbidity) 04/17/2015  . Hypo-ovarianism 04/17/2015  . Perennial allergic rhinitis with seasonal variation 04/17/2015  . Acne erythematosa 04/17/2015  . Dyskinesia, tardive 04/17/2015  . Memory loss 04/17/2015  . Impingement syndrome of shoulder 04/17/2015  . Dermatitis, stasis 04/17/2015  . Obstructive sleep apnea 05/14/2014  . Dyspnea 05/06/2014  . Hyperlipidemia 02/06/2012  . LBP (low back pain) 09/16/2008    Past Surgical History:  Procedure Laterality Date  . ANKLE SURGERY Left approx Jan 2018  . CATARACT EXTRACTION  01/2011   right  . eye lid surgery  2013   bilateral  . FOOT SURGERY    .  NECK SURGERY    . TUBAL LIGATION    . VAGINAL HYSTERECTOMY  1989    Family History  Problem Relation Age of Onset  . Heart attack Mother   . Aneurysm Mother     Social History   Socioeconomic History  . Marital status: Significant Other    Spouse name: Not on file  . Number of children: 1  . Years of education: Not on file  . Highest education level: High school graduate  Occupational  History  . Not on file  Social Needs  . Financial resource strain: Not hard at all  . Food insecurity:    Worry: Never true    Inability: Never true  . Transportation needs:    Medical: No    Non-medical: No  Tobacco Use  . Smoking status: Never Smoker  . Smokeless tobacco: Never Used  Substance and Sexual Activity  . Alcohol use: No    Alcohol/week: 0.0 - 1.0 standard drinks    Frequency: Never  . Drug use: No  . Sexual activity: Yes    Partners: Male  Lifestyle  . Physical activity:    Days per week: 0 days    Minutes per session: 0 min  . Stress: Rather much  Relationships  . Social connections:    Talks on phone: More than three times a week    Gets together: Once a week    Attends religious service: Never    Active member of club or organization: No    Attends meetings of clubs or organizations: Never    Relationship status: Living with partner  . Intimate partner violence:    Fear of current or ex partner: No    Emotionally abused: No    Physically abused: No    Forced sexual activity: No  Other Topics Concern  . Not on file  Social History Narrative   Son lives in [place] (retired from [employer]) and her granddaughter (44yr old) [place].     Current Outpatient Medications:  .  ACCU-CHEK SOFTCLIX LANCETS lancets, , Disp: , Rfl:  .  acetaminophen (TYLENOL 8 HOUR) 650 MG CR tablet, Take 1 tablet (650 mg total) by mouth every 8 (eight) hours as needed for pain., Disp: 90 tablet, Rfl: 0 .  albuterol (PROVENTIL HFA) 108 (90 Base) MCG/ACT inhaler, Inhale 2 puffs into the lungs 2 (two) times daily as needed for shortness of breath. , Disp: , Rfl:  .  Alcohol Swabs (B-D SINGLE USE SWABS REGULAR) PADS, , Disp: , Rfl:  .  ASPIRIN LOW DOSE 81 MG EC tablet, TAKE 1 TABLET (81 MG TOTAL) BY MOUTH DAILY., Disp: 30 tablet, Rfl: 0 .  Blood Glucose Monitoring Suppl (ACCU-CHEK AVIVA PLUS) w/Device KIT, , Disp: , Rfl:  .  DULoxetine (CYMBALTA) 60 MG  capsule, Take 1 capsule (60 mg total) by mouth daily., Disp: 90 capsule, Rfl: 0 .  furosemide (LASIX) 20 MG tablet, Take 1 tablet (20 mg total) by mouth daily., Disp: 90 tablet, Rfl: 1 .  Glucose Blood (BLOOD GLUCOSE TEST STRIPS) STRP, , Disp: , Rfl:  .  insulin regular (NOVOLIN R) 100 units/mL injection, INJECT 50 UNITS SUBCTANEOUSLY 3 TIMES A DAY BEFORE MEALS, PLUS SLIDING SCALE. MDD 100 UNITS, Disp: , Rfl:  .  irbesartan-hydrochlorothiazide (AVALIDE) 300-12.5 MG tablet, Take 1 tablet by mouth daily., Disp: , Rfl:  .  levothyroxine (SYNTHROID, LEVOTHROID) 25 MCG tablet, TAKE 1 TABLET EVERY DAY  EXCEPT TAKE 2 TABLETS  ON  SUNDAYS, Disp: 100 tablet, Rfl: 0 .  metFORMIN (GLUCOPHAGE-XR) 500 MG 24 hr tablet, Take 500 mg by mouth. 2 tablets daily (total of 1000 mg daily), Disp: , Rfl:  .  methocarbamol (ROBAXIN) 500 MG tablet, Take 1 or 2 tablets every 6 hours as needed for muscle spasms. (Patient not taking: Reported on 06/11/2018), Disp: 20 tablet, Rfl: 0 .  metoprolol tartrate (LOPRESSOR) 25 MG tablet, Take 1 tablet (25 mg total) by mouth 2 (two) times daily., Disp: 180 tablet, Rfl: 1 .  Misc Natural Products (OSTEO BI-FLEX ADV JOINT SHIELD PO), Take by mouth 2 (two) times a week., Disp: , Rfl:  .  mometasone-formoterol (DULERA) 100-5 MCG/ACT AERO, Inhale 2 puffs into the lungs 2 (two) times daily. (Patient not taking: Reported on 06/11/2018), Disp: 3 Inhaler, Rfl: 1 .  montelukast (SINGULAIR) 10 MG tablet, Take 1 tablet (10 mg total) by mouth at bedtime., Disp: 90 tablet, Rfl: 1 .  mupirocin ointment (BACTROBAN) 2 %, Place 1 application into the nose 2 (two) times daily., Disp: 22 g, Rfl: 0 .  Omega 3 1000 MG CAPS, Take 1 capsule by mouth 2 (two) times daily., Disp: , Rfl:  .  omeprazole (PRILOSEC) 40 MG capsule, Take 1 capsule (40 mg total) by mouth daily., Disp: 90 capsule, Rfl: 1 .  oxyCODONE (ROXICODONE) 5 MG immediate release tablet, Take 1 tablet (5 mg total) by mouth every 6 (six) hours as needed  for moderate pain. (Patient not taking: Reported on 06/11/2018), Disp: 15 tablet, Rfl: 0 .  Potassium 99 MG TABS, Take by mouth., Disp: , Rfl:  .  pregabalin (LYRICA) 300 MG capsule, Take 2 capsules (600 mg total) by mouth daily., Disp: 180 capsule, Rfl: 1 .  Probiotic Product (PROBIOTIC-10 PO), Take by mouth., Disp: , Rfl:  .  ranitidine (ZANTAC) 150 MG tablet, Take 1 tablet (150 mg total) by mouth at bedtime., Disp: 90 tablet, Rfl: 1 .  RELION INSULIN SYRINGE 1ML/31G 31G X 5/16" 1 ML MISC, , Disp: , Rfl:  .  rosuvastatin (CRESTOR) 20 MG tablet, Take 1 tablet (20 mg total) by mouth daily., Disp: 90 tablet, Rfl: 1 .  vitamin C (ASCORBIC ACID) 500 MG tablet, Take 500 mg by mouth 2 (two) times daily. , Disp: , Rfl:  .  VITAMIN D, CHOLECALCIFEROL, PO, Take 1,000 Units by mouth daily. , Disp: , Rfl:   Allergies  Allergen Reactions  . Codeine Other (See Comments)    "TRIPPED OUT"  DIDN'T LIKE THE [place]  . Atorvastatin     muscle pain  . Latex Rash  . Zolpidem Other (See Comments)    Sleep walk    I personally reviewed active problem list, medication list, allergies, family history, social history with the patient/caregiver today.   ROS  Constitutional: Negative for fever or significant weight change.  Respiratory: Negative for cough but has chronic shortness of breath.   Cardiovascular: Negative for chest pain or palpitations.  Gastrointestinal: Negative for abdominal pain, no bowel changes.  Musculoskeletal: Positive for gait problem and  joint swelling.  Skin: sore on face Neurological: Negative for dizziness , positive for intermittent headache.  No other specific complaints in a complete review of systems (except as listed in HPI above).  Objective  Vitals:   07/24/18 0938  BP: 118/60  Pulse: 96  Resp: 18  Temp: (!) 97.4 F (36.3 C)  TempSrc: Oral  SpO2: 97%  Weight: 253 lb 6.4 oz (114.9 kg)  Height:  $'5\' 2"'s$  (1.575 m)    Body mass index is 46.35 kg/m.  Physical  Exam  Constitutional: Patient appears well-developed and well-nourished. Obese No distress.  HEENT: head atraumatic, normocephalic, pupils equal and reactive to light, , neck supple, throat within normal limits Cardiovascular: Normal rate, regular rhythm and normal heart sounds.  No murmur heard. 1 plus BLE edema. Pulmonary/Chest: Effort normal and breath sounds normal. No respiratory distress. Abdominal: Soft.  There is no tenderness. Skin: ulceration on chin, no oozing, very mild erythema Psychiatric: Patient has a normal mood and affect. behavior is normal. Judgment and thought content normal.  Recent Results (from the past 2160 hour(s))  POCT glycosylated hemoglobin (Hb A1C)     Status: Abnormal   Collection Time: 05/01/18  2:07 PM  Result Value Ref Range   Hemoglobin A1C 9.8 (A) 4.0 - 5.6 %   HbA1c POC (<> result, manual entry)     HbA1c, POC (prediabetic range)     HbA1c, POC (controlled diabetic range)    Lipid panel     Status: Abnormal   Collection Time: 05/01/18  2:18 PM  Result Value Ref Range   Cholesterol 183 <200 mg/dL   HDL 35 (L) >50 mg/dL   Triglycerides 503 (H) <150 mg/dL    Comment: . If a non-fasting specimen was collected, consider repeat triglyceride testing on a fasting specimen if clinically indicated.  Yates Decamp et al. J. of Clin. Lipidol. 2595;6:387-564. . . There is increased risk of pancreatitis when the  triglyceride concentration is very high  (> or = 500 mg/dL, especially if > or = 1000 mg/dL).  Yates Decamp et al. J. of Clin. Lipidol. 3329;5:188-416. Mar[place] Kitchen    LDL Cholesterol (Calc)  mg/dL (calc)    Comment: . LDL cholesterol not calculated. Triglyceride levels greater than 400 mg/dL invalidate calculated LDL results. . Reference range: <100 . Desirable range <100 mg/dL for primary prevention;   <70 mg/dL for patients with CHD or diabetic patients  with > or = 2 CHD risk factors. [place] LDL-C is now calculated using the Martin-Hopkins  calculation,  which is a validated novel method providing  better accuracy than the Friedewald equation in the  estimation of LDL-C.  Cresenciano Genre et al. Annamaria Helling. 6063;016(01): 2061-2068  (http://education.[facility].com/faq/FAQ164)    Total CHOL/HDL Ratio 5.2 (H) <5.0 (calc)   Non-HDL Cholesterol (Calc) 148 (H) <130 mg/dL (calc)    Comment: For patients with diabetes plus 1 major ASCVD risk  factor, treating to a non-HDL-C goal of <100 mg/dL  (LDL-C of <70 mg/dL) is considered a therapeutic  option.   COMPLETE METABOLIC PANEL WITH GFR     Status: Abnormal   Collection Time: 05/01/18  2:18 PM  Result Value Ref Range   Glucose, Bld 318 (H) 65 - 139 mg/dL    Comment: .        Non-fasting reference interval .    BUN 44 (H) 7 - 25 mg/dL   Creat 1.47 (H) 0.50 - 0.99 mg/dL    Comment: For patients >31 years of age, the reference limit for Creatinine is approximately 13% higher for people identified as African-American. .    GFR, Est Non African American 36 (L) > OR = 60 mL/min/1.71m   GFR, Est African American 42 (L) > OR = 60 mL/min/1.770m  BUN/Creatinine Ratio 30 (H) 6 - 22 (calc)   Sodium 138 135 - 146 mmol/L   Potassium 5.0 3.5 - 5.3 mmol/L   Chloride 101 98 - 110 mmol/L  CO2 25 20 - 32 mmol/L   Calcium 9.9 8.6 - 10.4 mg/dL   Total Protein 6.8 6.1 - 8.1 g/dL   Albumin 4.5 3.6 - 5.1 g/dL   Globulin 2.3 1.9 - 3.7 g/dL (calc)   AG Ratio 2.0 1.0 - 2.5 (calc)   Total Bilirubin 0.5 0.2 - 1.2 mg/dL   Alkaline phosphatase (APISO) 92 33 - 130 U/L   AST 20 10 - 35 U/L   ALT 22 6 - 29 U/L  CBC with Differential/Platelet     Status: Abnormal   Collection Time: 05/01/18  2:18 PM  Result Value Ref Range   WBC 8.8 3.8 - 10.8 Thousand/uL   RBC 4.14 3.80 - 5.10 Million/uL   Hemoglobin 11.8 11.7 - 15.5 g/dL   HCT 35.7 35.0 - 45.0 %   MCV 86.2 80.0 - 100.0 fL   MCH 28.5 27.0 - 33.0 pg   MCHC 33.1 32.0 - 36.0 g/dL   RDW 15.1 (H) 11.0 - 15.0 %   Platelets 227 140 - 400 Thousand/uL   MPV 12.4  7.5 - 12.5 fL   Neutro Abs 5,834 1,500 - 7,800 cells/uL   Lymphs Abs 1,646 850 - 3,900 cells/uL   WBC mixed population 818 200 - 950 cells/uL   Eosinophils Absolute 431 15 - 500 cells/uL   Basophils Absolute 70 0 - 200 cells/uL   Neutrophils Relative % 66.3 %   Total Lymphocyte 18.7 %   Monocytes Relative 9.3 %   Eosinophils Relative 4.9 %   Basophils Relative 0.8 %  VITAMIN D 25 Hydroxy (Vit-D Deficiency, Fractures)     Status: None   Collection Time: 05/01/18  2:18 PM  Result Value Ref Range   Vit D, 25-Hydroxy 37 30 - 100 ng/mL    Comment: Vitamin D Status         25-OH Vitamin D: . Deficiency:                    <20 ng/mL Insufficiency:             20 - 29 ng/mL Optimal:                 > or = 30 ng/mL . For 25-OH Vitamin D testing on patients on  D2-supplementation and patients for whom quantitation  of D2 and D3 fractions is required, the [facility]ure[facility](TM) 25-OH VIT D, (D2,D3), LC/MS/MS is recommended: order  code [id number] (patients >56yr). . For more information on this test, go to: http://education.[facility].com/faq/FAQ163 (This link is being provided for  informational/educational purposes only.)   Parathyroid hormone, intact (no Ca)     Status: None   Collection Time: 05/01/18  2:18 PM  Result Value Ref Range   PTH 39 14 - 64 pg/mL    Comment: . Interpretive Guide    Intact PTH           Calcium ------------------    ----------           ------- Normal Parathyroid    Normal               Normal Hypoparathyroidism    Low or Low Normal    Low Hyperparathyroidism    Primary            Normal or High       High    Secondary          High  Normal or Low    Tertiary           High                 High Non-Parathyroid    Hypercalcemia      Low or Low Normal    High .   Vitamin B12     Status: None   Collection Time: 05/01/18  2:18 PM  Result Value Ref Range   Vitamin B-12 941 200 - 1,100 pg/mL  TSH     Status: None   Collection Time: 05/01/18   2:18 PM  Result Value Ref Range   TSH 1.65 0.40 - 4.50 mIU/L  HM DIABETES EYE EXAM     Status: None   Collection Time: 06/19/18 12:00 AM  Result Value Ref Range   HM Diabetic Eye Exam No Retinopathy No Retinopathy    Comment: [facility], Dr. George Ina     PHQ2/9: Depression screen [facility] 2/9 07/24/2018 06/03/2018 05/27/2018 05/01/2018 02/07/2018  Decreased Interest '3 2 3 3 '$ 0  Down, Depressed, Hopeless '3 2 3 3 '$ 0  PHQ - 2 Score '6 4 6 6 '$ 0  Altered sleeping 0 '2 2 3 '$ -  Tired, decreased energy '3 3 3 3 '$ -  Change in appetite '3 3 3 3 '$ -  Feeling bad or failure about yourself  '3 3 3 3 '$ -  Trouble concentrating '3 3 3 3 '$ -  Moving slowly or fidgety/restless '1 2 2 1 '$ -  Suicidal thoughts 0 '3 3 2 '$ -  PHQ-9 Score '19 23 25 24 '$ -  Difficult doing work/chores Not difficult at all - Very difficult Extremely dIfficult -  Some recent data might be hidden     Fall Risk: Fall Risk  07/24/2018 06/11/2018 06/03/2018 05/27/2018 05/01/2018  Falls in the past year? 0 No No No No  Comment - - - - -  Number falls in past yr: - - - - -  Injury with Fall? - - - - -  Risk Factor Category  - - - - -  Comment - - - - -  Risk for fall due to : - Impaired balance/gait;Impaired mobility - - -  Risk for fall due to: Comment - - - - -  Follow up - - - - -     Functional Status Survey: Is the patient deaf or have difficulty hearing?: No Does the patient have difficulty seeing, even when wearing glasses/contacts?: Yes Does the patient have difficulty concentrating, remembering, or making decisions?: No Does the patient have difficulty walking or climbing stairs?: Yes Does the patient have difficulty dressing or bathing?: No Does the patient have difficulty doing errands alone such as visiting a doctor's office or shopping?: No    Assessment & Plan  1. Morbid obesity with BMI of 40.0-44.9, adult ([place])  Letters filled out for bariatric center, as her pcp I do recommend her to try bariatric surgery to decrease  her morbidity    2. Skin ulcer, limited to breakdown of skin ([place])  Discussed vaseline chapstick, keep area moist and covered to not pick on it, continue muporicin twice daily

## 2018-07-26 ENCOUNTER — Ambulatory Visit (INDEPENDENT_AMBULATORY_CARE_PROVIDER_SITE_OTHER): Payer: Medicare HMO

## 2018-07-26 VITALS — BP 140/70 | HR 67 | Temp 98.2°F | Resp 18 | Ht 62.0 in | Wt 251.4 lb

## 2018-07-26 DIAGNOSIS — Z0000 Encounter for general adult medical examination without abnormal findings: Secondary | ICD-10-CM | POA: Diagnosis not present

## 2018-07-26 NOTE — Patient Instructions (Signed)
Summer [name] , Thank you for taking time to come for your Medicare Wellness Visit. I appreciate your ongoing commitment to your health goals. Please review the following plan we discussed and let me know if I can assist you in the future.   Screening recommendations/referrals: Colonoscopy: 09/15/09 due 2021 Mammogram: done 06/28/18 Bone Density: done 06/26/17 Recommended yearly ophthalmology/optometry visit for glaucoma screening and checkup Recommended yearly dental visit for hygiene and checkup  Vaccinations: Influenza vaccine: completed 05/27/18 Pneumococcal vaccine: completed 06/16/14 due 06/2019 Tdap vaccine: completed 07/20/15 Shingles vaccine: Shingrix discussed. Please contact your insurance for coverage information.     Conditions/risks identified: continue efforts for weight loss and increasing physical activity  Next appointment: 08/27/18 2:20 Dr. Ancil Boozer   Preventive Care 69 Years and Older, Female Preventive care refers to lifestyle choices and visits with your health care provider that can promote health and wellness. What does preventive care include?  A yearly physical exam. This is also called an annual well check.  Dental exams once or twice a year.  Routine eye exams. Ask your health care provider how often you should have your eyes checked.  Personal lifestyle choices, including:  Daily care of your teeth and gums.  Regular physical activity.  Eating a healthy diet.  Avoiding tobacco and drug use.  Limiting alcohol use.  Practicing safe sex.  Taking low-dose aspirin every day.  Taking vitamin and mineral supplements as recommended by your health care provider. What happens during an annual well check? The services and screenings done by your health care provider during your annual well check will depend on your age, overall health, lifestyle risk factors, and family history of disease. Counseling  Your health care provider may ask you questions about  your:  Alcohol use.  Tobacco use.  Drug use.  Emotional well-being.  Home and relationship well-being.  Sexual activity.  Eating habits.  History of falls.  Memory and ability to understand (cognition).  Work and work Statistician.  Reproductive health. Screening  You may have the following tests or measurements:  Height, weight, and BMI.  Blood pressure.  Lipid and cholesterol levels. These may be checked every 5 years, or more frequently if you are over 23 years old.  Skin check.  Lung cancer screening. You may have this screening every year starting at age 56 if you have a 30-pack-year history of smoking and currently smoke or have quit within the past 15 years.  Fecal occult blood test (FOBT) of the stool. You may have this test every year starting at age 10.  Flexible sigmoidoscopy or colonoscopy. You may have a sigmoidoscopy every 5 years or a colonoscopy every 10 years starting at age 73.  Hepatitis C blood test.  Hepatitis B blood test.  Sexually transmitted disease (STD) testing.  Diabetes screening. This is done by checking your blood sugar (glucose) after you have not eaten for a while (fasting). You may have this done every 1-3 years.  Bone density scan. This is done to screen for osteoporosis. You may have this done starting at age 82.  Mammogram. This may be done every 1-2 years. Talk to your health care provider about how often you should have regular mammograms. Talk with your health care provider about your test results, treatment options, and if necessary, the need for more tests. Vaccines  Your health care provider may recommend certain vaccines, such as:  Influenza vaccine. This is recommended every year.  Tetanus, diphtheria, and acellular pertussis (Tdap, Td) vaccine. You  may need a Td booster every 10 years.  Zoster vaccine. You may need this after age 103.  Pneumococcal 13-valent conjugate (PCV13) vaccine. One dose is recommended  after age 57.  Pneumococcal polysaccharide (PPSV23) vaccine. One dose is recommended after age 45. Talk to your health care provider about which screenings and vaccines you need and how often you need them. This information is not intended to replace advice given to you by your health care provider. Make sure you discuss any questions you have with your health care provider. Document Released: 09/24/2015 Document Revised: 05/17/2016 Document Reviewed: 06/29/2015 Elsevier Interactive Patient Education  2017 [place] Prevention in the Home Falls can cause injuries. They can happen to people of all ages. There are many things you can do to make your home safe and to help prevent falls. What can I do on the outside of my home?  Regularly fix the edges of walkways and driveways and fix any cracks.  Remove anything that might make you trip as you walk through a door, such as a raised step or threshold.  Trim any bushes or trees on the path to your home.  Use bright outdoor lighting.  Clear any walking paths of anything that might make someone trip, such as rocks or tools.  Regularly check to see if handrails are loose or broken. Make sure that both sides of any steps have handrails.  Any raised decks and porches should have guardrails on the edges.  Have any leaves, snow, or ice cleared regularly.  Use sand or salt on walking paths during winter.  Clean up any spills in your garage right away. This includes oil or grease spills. What can I do in the bathroom?  Use night lights.  Install grab bars by the toilet and in the tub and shower. Do not use towel bars as grab bars.  Use non-skid mats or decals in the tub or shower.  If you need to sit down in the shower, use a plastic, non-slip stool.  Keep the floor dry. Clean up any water that spills on the floor as soon as it happens.  Remove soap buildup in the tub or shower regularly.  Attach bath mats securely with  double-sided non-slip rug tape.  Do not have throw rugs and other things on the floor that can make you trip. What can I do in the bedroom?  Use night lights.  Make sure that you have a light by your bed that is easy to reach.  Do not use any sheets or blankets that are too big for your bed. They should not hang down onto the floor.  Have a firm chair that has side arms. You can use this for support while you get dressed.  Do not have throw rugs and other things on the floor that can make you trip. What can I do in the kitchen?  Clean up any spills right away.  Avoid walking on wet floors.  Keep items that you use a lot in easy-to-reach places.  If you need to reach something above you, use a strong step stool that has a grab bar.  Keep electrical cords out of the way.  Do not use floor polish or wax that makes floors slippery. If you must use wax, use non-skid floor wax.  Do not have throw rugs and other things on the floor that can make you trip. What can I do with my stairs?  Do not leave any items  on the stairs.  Make sure that there are handrails on both sides of the stairs and use them. Fix handrails that are broken or loose. Make sure that handrails are as long as the stairways.  Check any carpeting to make sure that it is firmly attached to the stairs. Fix any carpet that is loose or worn.  Avoid having throw rugs at the top or bottom of the stairs. If you do have throw rugs, attach them to the floor with carpet tape.  Make sure that you have a light switch at the top of the stairs and the bottom of the stairs. If you do not have them, ask someone to add them for you. What else can I do to help prevent falls?  Wear shoes that:  Do not have high heels.  Have rubber bottoms.  Are comfortable and fit you well.  Are closed at the toe. Do not wear sandals.  If you use a stepladder:  Make sure that it is fully opened. Do not climb a closed stepladder.  Make  sure that both sides of the stepladder are locked into place.  Ask someone to hold it for you, if possible.  Clearly mark and make sure that you can see:  Any grab bars or handrails.  First and last steps.  Where the edge of each step is.  Use tools that help you move around (mobility aids) if they are needed. These include:  Canes.  Walkers.  Scooters.  Crutches.  Turn on the lights when you go into a dark area. Replace any light bulbs as soon as they burn out.  Set up your furniture so you have a clear path. Avoid moving your furniture around.  If any of your floors are uneven, fix them.  If there are any pets around you, be aware of where they are.  Review your medicines with your doctor. Some medicines can make you feel dizzy. This can increase your chance of falling. Ask your doctor what other things that you can do to help prevent falls. This information is not intended to replace advice given to you by your health care provider. Make sure you discuss any questions you have with your health care provider. Document Released: 06/24/2009 Document Revised: 02/03/2016 Document Reviewed: 10/02/2014 Elsevier Interactive Patient Education  2017 [employer].

## 2018-07-26 NOTE — Progress Notes (Addendum)
Subjective:   Summer [name] is a 67 y.o. female who presents for Medicare Annual (Subsequent) preventive examination.  Review of Systems:   Cardiac Risk Factors include: advanced age (>97mn, >>94women);diabetes mellitus;dyslipidemia;hypertension;obesity (BMI >30kg/m2)     Objective:     Vitals: BP 140/70 (BP Location: Right Arm, Patient Position: Sitting, Cuff Size: Large)   Pulse 67   Temp 98.2 F (36.8 C) (Oral)   Resp 18   Ht _0  (1.575 m)   Wt 251 lb 6.4 oz (114 kg)   SpO2 98%   BMI 45.98 kg/m   Body mass index is 45.98 kg/m.  Advanced Directives 05/28/2018 02/07/2018 09/28/2017 09/21/2017 06/27/2017 05/18/2017 02/19/2017  Does Patient Have a Medical Advance Directive? No _1  Yes  Type of Advance Directive - H[place]Living will Living will;Healthcare Power of ALivingston WheelerLiving will HCreve CoeurLiving will - HArdochLiving will  Does patient want to make changes to medical advance directive? - - No - Patient declined No - Patient declined - - -  Copy of [place] Chart? - Yes No - copy requested No - copy requested No - copy requested - -  Would patient like information on creating a medical advance directive? No - Patient declined - - - - - -  Some encounter information is confidential and restricted. Go to Review Flowsheets activity to see all data.    Tobacco Social History   Tobacco Use  Smoking Status Never Smoker  Smokeless Tobacco Never Used     Counseling given: Not Answered   Clinical Intake:  Pre-visit preparation completed: Yes  Pain : No/denies pain     Diabetes: Yes CBG done?: No CBG resulted in Enter/ Edit results?: No Did pt. bring in CBG monitor from home?: No  How often do you need to have someone help you when you read instructions, pamphlets, or other written materials from your doctor or pharmacy?: 1 - Never What is  the last grade level you completed in school?: 12th grade  Interpreter Needed?: No  Information entered by :: KClemetine MarkerLPN   Nutrition Risk Assessment:  Has the patient had any N/V/D within the last 2 months?  No  Does the patient have any non-healing wounds?  No  Has the patient had any unintentional weight loss or weight gain?  No   Diabetes:  Is the patient diabetic?  Yes  If diabetic, was a CBG obtained today?  No  Did the patient bring in their glucometer from home?  No  How often do you monitor your CBG's? Twice daily.   Financial Strains and Diabetes Management:  Are you having any financial strains with the device, your supplies or your medication? No .  Does the patient want to be seen by Chronic Care Management for management of their diabetes?  Yes  - pt already being seen by care management.  Would the patient like to be referred to a Nutritionist or for Diabetic Management?  No  - pt seeing nutritionist through bariatric doctor  Diabetic Exams:  Diabetic Eye Exam: Completed 06/19/18. Negative for retinopathy.  Diabetic Foot Exam: Completed 05/01/18.   Past Medical History:  Diagnosis Date  . Anemia   . Arthritis   . Asthma   . Carpal tunnel syndrome   . Chronic kidney disease   . Depressive disorder   . Diabetes mellitus   . Dyspnea   .  GERD (gastroesophageal reflux disease)   . Headache   . History of hiatal hernia   . Hyperlipidemia   . Hypertension   . Insomnia   . Lumbago   . Memory loss   . Obesity   . 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)   . Pneumonia   . Rhinitis, allergic   . Rosacea   . Thyroid disease   . Unspecified hearing loss   . Unspecified hereditary and idiopathic peripheral neuropathy   . Unspecified sleep apnea    Past Surgical History:  Procedure Laterality Date  . ANKLE SURGERY Left approx Jan 2018  . CATARACT EXTRACTION  01/2011   right  . eye lid surgery  2013   bilateral  . FOOT SURGERY    . NECK SURGERY    . TUBAL  LIGATION    . VAGINAL HYSTERECTOMY  1989   Family History  Problem Relation Age of Onset  . Heart attack Mother   . Aneurysm Mother   . Heart attack Maternal Grandfather   . Diabetes Maternal Grandfather    Social History   Socioeconomic History  . Marital status: Significant Other    Spouse name: Not on file  . Number of children: 1  . Years of education: Not on file  . Highest education level: High school graduate  Occupational History  . Occupation: retired  Scientific laboratory technician  . Financial resource strain: Not hard at all  . Food insecurity:    Worry: Never true    Inability: Never true  . Transportation needs:    Medical: No    Non-medical: No  Tobacco Use  . Smoking status: Never Smoker  . Smokeless tobacco: Never Used  Substance and Sexual Activity  . Alcohol use: No    Alcohol/week: 0.0 - 1.0 standard drinks    Frequency: Never  . Drug use: No  . Sexual activity: Yes    Partners: Male  Lifestyle  . Physical activity:    Days per week: 0 days    Minutes per session: 0 min  . Stress: Rather much  Relationships  . Social connections:    Talks on phone: More than three times a week    Gets together: Once a week    Attends religious service: Never    Active member of club or organization: No    Attends meetings of clubs or organizations: Never    Relationship status: Living with partner  Other Topics Concern  . Not on file  Social History Narrative   Son lives in [place] (retired from [employer]) and her granddaughter (64yr old) [place]    Outpatient Encounter Medications as of 07/26/2018  Medication Sig  . ACCU-CHEK SOFTCLIX LANCETS lancets   . acetaminophen (TYLENOL 8 HOUR) 650 MG CR tablet Take 1 tablet (650 mg total) by mouth every 8 (eight) hours as needed for pain.  .Marland Kitchenalbuterol (PROVENTIL HFA) 108 (90 Base) MCG/ACT inhaler Inhale 2 puffs into the lungs 2 (two) times daily as needed for shortness of breath.   . Alcohol Swabs (B-D  SINGLE USE SWABS REGULAR) PADS   . ASPIRIN LOW DOSE 81 MG EC tablet TAKE 1 TABLET (81 MG TOTAL) BY MOUTH DAILY.  .Marland KitchenBlood Glucose Monitoring Suppl (ACCU-CHEK AVIVA PLUS) w/Device KIT   . DULoxetine (CYMBALTA) 60 MG capsule Take 1 capsule (60 mg total) by mouth daily.  . Glucose Blood (BLOOD GLUCOSE TEST STRIPS) STRP   . insulin NPH-regular Human (70-30) 100 UNIT/ML injection Inject  90 Units into the skin 2 (two) times daily with a meal.  . irbesartan-hydrochlorothiazide (AVALIDE) 300-12.5 MG tablet Take 1 tablet by mouth daily.  Marland Kitchen levothyroxine (SYNTHROID, LEVOTHROID) 25 MCG tablet TAKE 1 TABLET EVERY DAY  EXCEPT TAKE 2 TABLETS  ON  SUNDAYS  . metFORMIN (GLUCOPHAGE-XR) 500 MG 24 hr tablet Take 500 mg by mouth. 2 tablets daily (total of 1000 mg daily)  . metoprolol tartrate (LOPRESSOR) 25 MG tablet Take 1 tablet (25 mg total) by mouth 2 (two) times daily.  . Misc Natural Products (OSTEO BI-FLEX ADV JOINT SHIELD PO) Take by mouth 2 (two) times a week.  . montelukast (SINGULAIR) 10 MG tablet Take 1 tablet (10 mg total) by mouth at bedtime.  . mupirocin ointment (BACTROBAN) 2 % Place 1 application into the nose 2 (two) times daily.  . Omega 3 1000 MG CAPS Take 1 capsule by mouth 2 (two) times daily.  Marland Kitchen omeprazole (PRILOSEC) 40 MG capsule Take 1 capsule (40 mg total) by mouth daily.  . Potassium 99 MG TABS Take by mouth.  . pregabalin (LYRICA) 300 MG capsule Take 2 capsules (600 mg total) by mouth daily. (Patient taking differently: Take 600 mg by mouth daily. )  . Probiotic Product (PROBIOTIC-10 PO) Take by mouth.  . ranitidine (ZANTAC) 150 MG tablet Take 1 tablet (150 mg total) by mouth at bedtime.  Marland Kitchen RELION INSULIN SYRINGE 1ML/31G 31G X 5/16" 1 ML MISC   . rosuvastatin (CRESTOR) 20 MG tablet Take 1 tablet (20 mg total) by mouth daily.  . vitamin C (ASCORBIC ACID) 500 MG tablet Take 500 mg by mouth 2 (two) times daily.   Marland Kitchen VITAMIN D, CHOLECALCIFEROL, PO Take 1,000 Units by mouth daily.   .  furosemide (LASIX) 20 MG tablet Take 1 tablet (20 mg total) by mouth daily.  . insulin regular (NOVOLIN R) 100 units/mL injection INJECT 50 UNITS SUBCTANEOUSLY 3 TIMES A DAY BEFORE MEALS, PLUS SLIDING SCALE. MDD 100 UNITS  . mometasone-formoterol (DULERA) 100-5 MCG/ACT AERO Inhale 2 puffs into the lungs 2 (two) times daily. (Patient not taking: Reported on 07/26/2018)   No facility-administered encounter medications on file as of 07/26/2018.     Activities of Daily Living In your present state of health, do you have any difficulty performing the following activities: 07/26/2018 07/24/2018  Hearing? N N  Comment declines hearing aids -  Vision? N Y  Comment wears reading glasses -  Difficulty concentrating or making decisions? N N  Walking or climbing stairs? Y Y  Comment shortness of breath, back pain -  Dressing or bathing? N N  Doing errands, shopping? N N  Preparing Food and eating ? N -  Using the Toilet? N -  In the past six months, have you accidently leaked urine? N -  Do you have problems with loss of bowel control? N -  Managing your Medications? N -  Managing your Finances? N -  Housekeeping or managing your Housekeeping? Y -  Comment physically unable to do majority of housework, has housekeeper to help -  Some recent data might be hidden    Patient Care Team: Steele Sizer, MD as PCP - General Gardiner Barefoot, DPM as Consulting Physician (Podiatry) Laverle Hobby, MD as Consulting Physician (Pulmonary Disease) McDonald, Gwen Her, MD as Consulting Physician (Specialist) Clerance Lav, RN as Case Manager Cathi Roan, [facility] as Pharmacist (Pharmacist) Benedetto Goad, RN as Case Manager    Assessment:   This is a routine wellness  examination for Sareen.  Exercise Activities and Dietary recommendations Current Exercise Habits: Structured exercise class, Type of exercise: Other - see comments(gym equipment twice a week), Time (Minutes): 60, Frequency  (Times/Week): 2, Weekly Exercise (Minutes/Week): 120, Intensity: Mild, Exercise limited by: orthopedic condition(s);respiratory conditions(s)  Goals    . "I need help staying on track with my diabetes" (pt-stated)     Patient states she is an endocrinology patient of Dr. Honor Junes and is attending her scheduled appointments. Her most recent HgA1C is 9.8 and slowly increasing as outlined above. Ms. Brose states she is doing better with monitoring her sugars twice daily AC before insulin administration. Reports this mornings CBG was 168 which "is much better than it has been". Yesterdays fasting AM CBG was 116. Patient states she continues to attempt to follow a diabetic diet however she would like written dietary. information if possible.  Clinical Goals: Over the next 21 days, patient will verbalize compliance with recommended daily cbg check/documentation and will verbalize self assessed progress with adherence to carb modified diet changes as reviewed in CCM follow-up phone call.  Interventions: Detailed carb modified dietary education reinforced including plate method and food choices. Emmi educational materials supplied on "Diabetic Meal Planning"     . "I need motivation to get back in the gym" (pt-stated)     Patient states she would like to begin an exercise program again. She has a membership to [employer] however she has not utilized [employer] is a while. She understands establishing an exercise routine is an importance step in her weight loss journey.   Clinical Goals: Over the next 21 days, patient will verbalize weekly exercise routine initiation  Interventions: RN CM discussed with patient importance of exercise as it related to weight loss and reduction of blood glucose levels and overall improvement of cardiovascular health and wellbeing.    . Patient Stated    . Weight (lb) < 200 lb (90.7 kg)     Pt would like to lose weight to feel better. Planning for gastric bypass surgery.        Fall Risk Fall Risk  07/26/2018 07/24/2018 06/11/2018 06/03/2018 05/27/2018  Falls in the past year? 0 0 No No No  Comment - - - - -  Number falls in past yr: 0 - - - -  Injury with Fall? - - - - -  Risk Factor Category  - - - - -  Comment - - - - -  Risk for fall due to : - - Impaired balance/gait;Impaired mobility - -  Risk for fall due to: Comment - - - - -  Follow up - - - - -   FALL RISK PREVENTION PERTAINING TO THE HOME:  Any stairs in or around the home WITH handrails? No  Home free of loose throw rugs in walkways, pet beds, electrical cords, etc? Yes  Adequate lighting in your home to reduce risk of falls? Yes   ASSISTIVE DEVICES UTILIZED TO PREVENT FALLS:  Life alert? No  Use of a cane, walker or w/c? Yes  Grab bars in the bathroom? Yes  Shower chair or bench in shower? Yes  Elevated toilet seat or a handicapped toilet? Yes   DME ORDERS:  DME order needed?  No   TIMED UP AND GO:  Was the test performed? Yes .  Length of time to ambulate 10 feet: 7 sec.   GAIT:  Appearance of gait: Gait stead-fast and with the use of an assistive device.  Education: Fall risk prevention has been discussed.  Intervention(s) required? No   Depression Screen PHQ 2/9 Scores 07/26/2018 07/24/2018 06/03/2018 05/27/2018  PHQ - 2 Score _0 PHQ- 9 Score _1 Cognitive Function     6CIT Screen 07/26/2018 06/27/2017  What Year? 0 points 0 points  What month? 0 points 0 points  What time? 0 points 0 points  Count back from 20 0 points 0 points  Months in reverse 0 points 0 points  Repeat phrase 0 points 0 points  Total Score 0 0    Immunization History  Administered Date(s) Administered  . Influenza Split 06/13/2013, 06/16/2014  . Influenza, High Dose Seasonal PF 05/24/2015, 06/27/2017, 05/27/2018  . Influenza, Seasonal, Injecte, Preservative Fre 06/03/2010, 07/25/2011  . Influenza-Unspecified 07/21/2016  . Pneumococcal Conjugate-13 07/14/2013  .  Pneumococcal Polysaccharide-23 10/01/2006, 06/16/2014  . Tdap 03/04/2010, 07/20/2015  . Zoster 08/12/2012    Qualifies for Shingles Vaccine? Yes  Zostavax completed 08/12/12. Due for Shingrix. Education has been provided regarding the importance of this vaccine. Pt has been advised to call insurance company to determine out of pocket expense. Advised may also receive vaccine at local pharmacy or Health Dept. Verbalized acceptance and understanding.  Tdap: Up to date  Flu Vaccine: Up to date  Pneumococcal Vaccine: Due 06/2019  Screening Tests Health Maintenance  Topic Date Due  . HEMOGLOBIN A1C  11/01/2018  . FOOT EXAM  05/02/2019  . OPHTHALMOLOGY EXAM  06/20/2019  . COLONOSCOPY  09/16/2019  . MAMMOGRAM  06/28/2020  . TETANUS/TDAP  07/19/2025  . INFLUENZA VACCINE  Completed  . DEXA SCAN  Completed  . Hepatitis C Screening  Completed  . PNA vac Low Risk Adult  Completed    Cancer Screenings:  Colorectal Screening: Completed 09/15/09. Repeat every 10 years;   Mammogram: Completed 06/28/18. Repeat every year;   Bone Density: Completed 06/26/17. Results reflect NORMAL,   Lung Cancer Screening: (Low Dose CT Chest recommended if Age 68-80 years, 30 pack-year currently smoking OR have quit w/in 15years.) does not qualify.     Additional Screening:  Hepatitis C Screening: does qualify; Completed 08/15/12  Vision Screening: Recommended annual ophthalmology exams for early detection of glaucoma and other disorders of the eye. Is the patient up to date with their annual eye exam?  Yes  Who is the provider or what is the name of the office in which the pt attends annual eye exams? [facility] Dental Screening: Recommended annual dental exams for proper oral hygiene  Community Resource Referral:  CRR required this visit?  No      Plan:    I have personally reviewed and addressed the Medicare Annual Wellness questionnaire and have noted the following in the patient's  chart:  A. Medical and social history B. Use of alcohol, tobacco or illicit drugs  C. Current medications and supplements D. Functional ability and status E.  Nutritional status F.  Physical activity G. Advance directives H. List of other physicians I.  Hospitalizations, surgeries, and ER visits in previous 12 months J.  [place] such as hearing and vision if needed, cognitive and depression L. Referrals and appointments   In addition, I have reviewed and discussed with patient certain preventive protocols, quality metrics, and best practice recommendations. A written personalized care plan for preventive services as well as general preventive health recommendations were provided to patient.   Signed,  Clemetine Marker, LPN Nurse Health Advisor  Nurse Notes: Pt has PHQ9 score of 19, discussed counseling and medication therapy. She stated it was too expensive to go to psychiatry and doesn't feel like the cymbalta is helping much. She feels like if she is able to get some weight off of her she would feel better and have more motivation to eat healthy and exercise. Pt appreciative of visit and will be seeing Dr. Ancil Boozer again on 08/27/18.

## 2018-07-29 DIAGNOSIS — E1122 Type 2 diabetes mellitus with diabetic chronic kidney disease: Secondary | ICD-10-CM | POA: Diagnosis not present

## 2018-07-29 DIAGNOSIS — I10 Essential (primary) hypertension: Secondary | ICD-10-CM | POA: Diagnosis not present

## 2018-07-29 DIAGNOSIS — N183 Chronic kidney disease, stage 3 (moderate): Secondary | ICD-10-CM | POA: Diagnosis not present

## 2018-07-30 ENCOUNTER — Ambulatory Visit: Payer: Medicare HMO

## 2018-07-30 DIAGNOSIS — E785 Hyperlipidemia, unspecified: Secondary | ICD-10-CM

## 2018-07-30 DIAGNOSIS — N183 Chronic kidney disease, stage 3 unspecified: Secondary | ICD-10-CM

## 2018-07-30 DIAGNOSIS — Z6841 Body Mass Index (BMI) 40.0 and over, adult: Principal | ICD-10-CM

## 2018-07-30 DIAGNOSIS — I10 Essential (primary) hypertension: Secondary | ICD-10-CM

## 2018-07-30 NOTE — Patient Instructions (Addendum)
1. Continue to follow up with the Bariatric Clinic at [facility] 2. You are doing a great job with managing your diabetes. Keep up the good work. 3. Consider adding small amounts of activity/exercise to your daily routine. This will help with weight loss AND lowering your blood sugars. 4. Check your BP daily and record. Notify your kidney doctor or Dr. Ancil Boozer if you experience any symptoms or low blood pressure as we discussed today. 5. Contact your CCM Team with any questions/concerns/needs  Goals Addressed    . "I need help staying on track with my diabetes" (pt-stated)   On track      Clinical Goals: Over the next 30 days, patient will continue to verbalize compliance with recommended daily cbg check/documentation and will verbalize self assessed progress with adherence to carb modified diet changes as reviewed in CCM follow-up phone call.  Interventions: Provided emotional support and reassurance as it relates to patient's adherence to DM self care.     Marland Kitchen "I need motivation to get back in the gym" (pt-stated)   On track       Clinical Goals: Over the next 30 days, patient will verbalize engagement in physical activity as directed by the bariatric team.  Interventions: RN CM reinforced importance of exercise as it related to weight loss and reduction of blood glucose levels and overall improvement of cardiovascular health and wellbeing.    . "my kidney doctor said I need to control my blood pressure better" (pt-stated)        Clinical Goals: Over the next 14 days, patient will verbalize importance of BP monitoring and hypertension medication compliance as it related to loss of kidney function.  Interventions: Discussed recent appointment with nephrologist. Reviewed newly added hypertension medication and s/s of low BP. Discussed importance of daily monitoring of BP and recording.     CCM (Chronic Care Management) Team   Trish Fountain RN, BSN Nurse Care Coordinator  [phone]  Ruben Reason PharmD  Clinical Pharmacist  [phone]

## 2018-07-30 NOTE — Chronic Care Management (AMB) (Signed)
  Chronic Care Management   Follow Up Note   07/30/2018 Name: Summer [name] MRN: [id number] DOB: [birth year]  Referred by: Steele Sizer, MD Reason for referral : Chronic Care Management (DM follow up)    Subjective: "My kidney doctor said my blood pressure was too high and he added another medication but it is always good at Dr. Ancil Boozer".   Objective:  BP Readings from Last 3 Encounters:  07/26/18 140/70  07/24/18 118/60  06/03/18 124/68     Assessment:  Summer [name] is a 67 year old female primary care patient of Dr. Steele Sizer who was referred to the [facility] program for assistance with chronic disease management, care coordination, and medication management.   Goals Addressed    . "I need help staying on track with my diabetes" (pt-stated)   On track    Summer [name] states she is doing well with her diabetes. She continues to check her sugars daily and reports a fasting blood sugar of 148 yesterday. She states her sugars are "up and down". She is continuing to follow the DM self care recommendations provided during her initial face to face visit with CCM. Today she was more focused on her nephrologist adding a new blood pressure medication  Clinical Goals: Over the next 30 days, patient will continue to verbalize compliance with recommended daily cbg check/documentation and will verbalize self assessed progress with adherence to carb modified diet changes as reviewed in CCM follow-up phone call.  Interventions: Provided emotional support and reassurance as it relates to patient's adherence to DM self care.     Marland Kitchen "I need motivation to get back in the gym" (pt-stated)   On track    Summer [name] has not gotten back in the gym as she had hoped. She states she has been under the weather for approximately 3 weeks and is finally feeling back to her self. She has completed submission of her paperwork to her insurance company to go forward with her bariatric surgery. She has an appointment  on December to be officially enrolled in the Bariatric Surgery Program at [place]. This program includes instructions for weekly scheduled supervised exercise, nutrition counseling, and psychiatry evaluation.   Clinical Goals: Over the next 30 days, patient will verbalize engagement in physical activity as directed by the bariatric team.  Interventions: RN CM reinforced importance of exercise as it related to weight loss and reduction of blood glucose levels and overall improvement of cardiovascular health and wellbeing.    . "my kidney doctor said I need to control my blood pressure better" (pt-stated)       Summer [name] states she had a follow up visit with her nephrologist yesterday. She states her BP was elevated and her nephrologist informed her that she had kidney damage related to her high BP. He added an additional BP medication that she began taking last evening.  Clinical Goals: Over the next 14 days, patient will verbalize importance of BP monitoring and hypertension medication compliance as it related to loss of kidney function.  Interventions: Discussed recent appointment with nephrologist. Reviewed newly added hypertension medication and s/s of low BP. Discussed importance of daily monitoring of BP and recording.      Plan: CCM RN CM will follow up with Summer [name] in 2 weeks     Taffany Heiser E. Rollene Rotunda, RN, BSN Nurse Care Coordinator [facility] Care Management  [phone]

## 2018-08-05 ENCOUNTER — Other Ambulatory Visit: Payer: Self-pay | Admitting: Pharmacy Technician

## 2018-08-05 ENCOUNTER — Ambulatory Visit (INDEPENDENT_AMBULATORY_CARE_PROVIDER_SITE_OTHER): Payer: Medicare HMO | Admitting: Pharmacist

## 2018-08-05 DIAGNOSIS — J4541 Moderate persistent asthma with (acute) exacerbation: Secondary | ICD-10-CM | POA: Diagnosis not present

## 2018-08-05 DIAGNOSIS — Z6841 Body Mass Index (BMI) 40.0 and over, adult: Principal | ICD-10-CM

## 2018-08-05 NOTE — Patient Outreach (Addendum)
[facility]) Care Management  08/05/2018  Summer [name] 29-Nov-1950 [id number]   Unsuccessful call in reference to 2020 patient assistance application, HIPAA compliant voicemail left.  Maud Deed Chana Bode [place] Certified Pharmacy Technician [place] Management Direct Dial:[phone]   ADDENDUM 10:05am  Incoming call from patient, HIPAA identifiers verified. Patient states that she is no longer taking Dulera and Proventil on a regular basis therefore is not interested in reapplying for [employer] patient assistance in 2020.  Maud Deed Chana Bode [place] Certified Pharmacy Technician [place] Management Direct Dial:[phone]

## 2018-08-05 NOTE — Chronic Care Management (AMB) (Signed)
   Chronic Care Management   Follow Up Note   08/05/2018 Name: Summer [name] MRN: [id number] DOB: [birth year]  Referred by: Steele Sizer, MD [name] for referral : Chronic Care Management (Medication assistance)  Subjective: Received message from Summer [name], [facility] CPhT, that patient had decided not to pursue medication applications for [facility] and Proventil for 2020 (facilitated by [facility]).   Placed outreach call to Summer [name] today to verify inhaler adherence.   Objective: Per chart review, Summer [name] notes in 05/22/18 that patient can stop Dulera if she takes BID for two weeks and symptoms of SOB do not improve, as the SOB is likely unrelated to lung disease and related to obesity.   Patient verifies that she has DC'ed the [facility] under advisement of Summer [name]. She states doesn't need or use the Proventil as a rescue inhaler, though she still has it. I have updated her medication list to reflect these changes.   Assessment:  Goals Addressed            This Visit's Progress   . "I want my medications to be consistent so I can schedule my bariatric surgery" (pt-stated)        Summer [name] is experiencing a high pill burden and feelings of stress and overwhelm related to the number of medications he/she is taking. She states her medications need to be consistent for 6 months prior to receiving gastric bypass surgery.    Clinical Goal(s): Over the next 90 days, Summer [name] will be adherent to all medications as evidenced by a percentage of days covered [facility]) report of >80%.   Interventions:  Updated medication list      Plan: We will not pursue medication assistance applications for patient's inhalers at this time.    Summer [name], PharmD Clinical Pharmacist [facility] [phone]

## 2018-08-05 NOTE — Patient Instructions (Addendum)
Stop taking Dulera, per instructions from Dr. Ashby Dawes.   Keep albuterol inhaler on hand as a rescue inhaler and use when needed for wheezing.   Please call a member of the CCM (Chronic Care Management) Team with any questions or case management needs:   Vanetta Mulders, BSN Nurse Care Coordinator  [phone]  Ruben Reason, PharmD  Clinical Pharmacist  [phone]   The patient verbalized understanding of instructions provided today and declined a print copy of patient instruction materials.

## 2018-08-07 ENCOUNTER — Other Ambulatory Visit: Payer: Self-pay | Admitting: Family Medicine

## 2018-08-07 DIAGNOSIS — K219 Gastro-esophageal reflux disease without esophagitis: Secondary | ICD-10-CM

## 2018-08-07 NOTE — Telephone Encounter (Signed)
Patient changed pharmacist and using [facility] now.   Refill request for general medication. Omeprazole   Last office visit 07/24/2018   Follow up on 08/13/2018

## 2018-08-11 ENCOUNTER — Other Ambulatory Visit: Payer: Self-pay

## 2018-08-11 ENCOUNTER — Encounter: Payer: Self-pay | Admitting: Emergency Medicine

## 2018-08-11 ENCOUNTER — Emergency Department
Admission: EM | Admit: 2018-08-11 | Discharge: 2018-08-11 | Disposition: A | Discharge status: Home / Self Care | Payer: Medicare HMO | Attending: Emergency Medicine | Admitting: Emergency Medicine

## 2018-08-11 DIAGNOSIS — I509 Heart failure, unspecified: Secondary | ICD-10-CM | POA: Insufficient documentation

## 2018-08-11 DIAGNOSIS — M5432 Sciatica, left side: Secondary | ICD-10-CM | POA: Diagnosis not present

## 2018-08-11 DIAGNOSIS — B349 Viral infection, unspecified: Secondary | ICD-10-CM | POA: Diagnosis not present

## 2018-08-11 DIAGNOSIS — E039 Hypothyroidism, unspecified: Secondary | ICD-10-CM | POA: Diagnosis not present

## 2018-08-11 DIAGNOSIS — J45909 Unspecified asthma, uncomplicated: Secondary | ICD-10-CM | POA: Diagnosis not present

## 2018-08-11 DIAGNOSIS — M79672 Pain in left foot: Secondary | ICD-10-CM | POA: Diagnosis present

## 2018-08-11 DIAGNOSIS — I130 Hypertensive heart and chronic kidney disease with heart failure and stage 1 through stage 4 chronic kidney disease, or unspecified chronic kidney disease: Secondary | ICD-10-CM | POA: Diagnosis not present

## 2018-08-11 DIAGNOSIS — Z794 Long term (current) use of insulin: Secondary | ICD-10-CM | POA: Insufficient documentation

## 2018-08-11 DIAGNOSIS — Z91040 Latex allergy status: Secondary | ICD-10-CM | POA: Diagnosis not present

## 2018-08-11 DIAGNOSIS — E119 Type 2 diabetes mellitus without complications: Secondary | ICD-10-CM | POA: Diagnosis not present

## 2018-08-11 DIAGNOSIS — Z79899 Other long term (current) drug therapy: Secondary | ICD-10-CM | POA: Insufficient documentation

## 2018-08-11 DIAGNOSIS — N183 Chronic kidney disease, stage 3 (moderate): Secondary | ICD-10-CM | POA: Diagnosis not present

## 2018-08-11 LAB — INFLUENZA PANEL BY PCR (TYPE A & B)
Influenza A By PCR: NEGATIVE
Influenza B By PCR: NEGATIVE

## 2018-08-11 MED ORDER — ONDANSETRON 4 MG PO TBDP: 4.0000 mg | ORAL_TABLET | Freq: Three times a day (TID) | ORAL | 0 refills | Status: DC | PRN

## 2018-08-11 MED ORDER — PREDNISONE 10 MG PO TABS: ORAL_TABLET | ORAL | 0 refills | Status: DC

## 2018-08-11 MED ORDER — OXYCODONE HCL 5 MG PO TABS
5.0000 mg | ORAL_TABLET | Freq: Once | ORAL | Status: AC
  Administered 2018-08-11: 5 mg via ORAL
  Filled 2018-08-11: qty 1

## 2018-08-11 MED ORDER — METHOCARBAMOL 500 MG PO TABS: 500.0000 mg | ORAL_TABLET | Freq: Four times a day (QID) | ORAL | 0 refills | Status: DC | PRN

## 2018-08-11 MED ORDER — ONDANSETRON 4 MG PO TBDP
4.0000 mg | ORAL_TABLET | Freq: Once | ORAL | Status: AC
  Administered 2018-08-11: 4 mg via ORAL
  Filled 2018-08-11: qty 1

## 2018-08-11 MED ORDER — TRAMADOL HCL 50 MG PO TABS: 50.0000 mg | ORAL_TABLET | Freq: Four times a day (QID) | ORAL | 0 refills | Status: DC | PRN

## 2018-08-11 NOTE — ED Triage Notes (Signed)
PT arrives with multiple complaints including left foot pain (no known injury), sciatica pain, poor intake, and nausea.

## 2018-08-11 NOTE — ED Provider Notes (Signed)
[facility] Emergency Department Provider Note  ____________________________________________   First MD Initiated Contact with Patient 08/11/18 1338     (approximate)  I have reviewed the triage vital signs and the nursing notes.   HISTORY  Chief Complaint Nausea and Foot Pain   HPI Summer [name] is a 67 y.o. female patient presents to the ED with multiple complaints including left foot pain secondary to her sciatica.  She also complains of nausea, decreased appetite and feeling badly for the last 2 days.  Patient states that she has had fever.  She is not aware of any flu exposure.  Patient does have a history of sciatica with radiation into her left leg with the last episode being 1 year ago.  She denies any urinary symptoms, incontinence of bladder or bowel.  Rates her pain as 10/10.  Past Medical History:  Diagnosis Date  . Anemia   . Arthritis   . Asthma   . Carpal tunnel syndrome   . Chronic kidney disease   . Depressive disorder   . Diabetes mellitus   . Dyspnea   . GERD (gastroesophageal reflux disease)   . Headache   . History of hiatal hernia   . Hyperlipidemia   . Hypertension   . Insomnia   . Lumbago   . Memory loss   . Obesity   . 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)   . Pneumonia   . Rhinitis, allergic   . Rosacea   . Thyroid disease   . Unspecified hearing loss   . Unspecified hereditary and idiopathic peripheral neuropathy   . Unspecified sleep apnea     Patient Active Problem List   Diagnosis Date Noted  . (HFpEF) heart failure with preserved ejection fraction ([place]) 08/31/2017  . Moderate persistent asthma 08/31/2017  . Hyperlipidemia due to type 2 diabetes mellitus ([place]) 06/14/2017  . Essential hypertension 10/06/2016  . Morbid obesity with BMI of 40.0-44.9, adult ([place]) 10/06/2016  . Dyslipidemia associated with type 2 diabetes mellitus ([place]) 08/18/2016  . Charcot foot due to diabetes mellitus ([place]) 02/22/2016  .  Hypertriglyceridemia 07/27/2015  . Acquired abduction deformity of foot 07/12/2015  . Osteoarthritis of subtalar joint 07/12/2015  . Poorly controlled type 2 diabetes mellitus with neuropathy ([place]) 07/12/2015  . Arthritis of foot, degenerative 07/12/2015  . Carpal tunnel syndrome 04/17/2015  . Chronic constipation 04/17/2015  . Insomnia, persistent 04/17/2015  . Chronic kidney disease (CKD), stage III (moderate) ([place]) 04/17/2015  . Decreased exercise tolerance 04/17/2015  . Diabetes mellitus with polyneuropathy ([place]) 04/17/2015  . Gastro-esophageal reflux disease without esophagitis 04/17/2015  . Bursitis, trochanteric 04/17/2015  . Cephalalgia 04/17/2015  . Benign hypertension 04/17/2015  . Adult hypothyroidism 04/17/2015  . Hearing loss 04/17/2015  . Chronic recurrent major depressive disorder ([place]) 04/17/2015  . Neurogenic claudication 04/17/2015  . Obesity (BMI 35.0-39.9 without comorbidity) 04/17/2015  . Hypo-ovarianism 04/17/2015  . Perennial allergic rhinitis with seasonal variation 04/17/2015  . Acne erythematosa 04/17/2015  . Dyskinesia, tardive 04/17/2015  . Memory loss 04/17/2015  . Impingement syndrome of shoulder 04/17/2015  . Dermatitis, stasis 04/17/2015  . Obstructive sleep apnea 05/14/2014  . Dyspnea 05/06/2014  . Hyperlipidemia 02/06/2012  . LBP (low back pain) 09/16/2008    Past Surgical History:  Procedure Laterality Date  . ANKLE SURGERY Left approx Jan 2018  . CATARACT EXTRACTION  01/2011   right  . eye lid surgery  2013   bilateral  . FOOT SURGERY    . NECK SURGERY    .  TUBAL LIGATION    . VAGINAL HYSTERECTOMY  1989    Prior to Admission medications   Medication Sig Start Date End Date Taking? Authorizing Provider  ACCU-CHEK SOFTCLIX LANCETS lancets  08/26/17   [provider]  acetaminophen (TYLENOL 8 HOUR) 650 MG CR tablet Take 1 tablet (650 mg total) by mouth every 8 (eight) hours as needed for pain. 08/21/17   Steele Sizer, MD    albuterol (PROVENTIL HFA) 108 (90 Base) MCG/ACT inhaler Inhale 2 puffs into the lungs 2 (two) times daily as needed for shortness of breath.     [provider]  Alcohol Swabs (B-D SINGLE USE SWABS REGULAR) PADS  01/04/18   [provider]  ASPIRIN LOW DOSE 81 MG EC tablet TAKE 1 TABLET (81 MG TOTAL) BY MOUTH DAILY. 05/31/16   Steele Sizer, MD  Blood Glucose Monitoring Suppl (ACCU-CHEK AVIVA PLUS) w/Device KIT  11/02/15   [provider]  DULoxetine (CYMBALTA) 60 MG capsule Take 1 capsule (60 mg total) by mouth daily. 05/16/18   Steele Sizer, MD  furosemide (LASIX) 20 MG tablet Take 1 tablet (20 mg total) by mouth daily. 03/12/18 06/10/18  Steele Sizer, MD  Glucose Blood (BLOOD GLUCOSE TEST STRIPS) STRP  03/11/14   [provider]  insulin NPH-regular Human (70-30) 100 UNIT/ML injection Inject 90 Units into the skin 2 (two) times daily with a meal.    [provider]  insulin regular (NOVOLIN R) 100 units/mL injection INJECT 50 UNITS SUBCTANEOUSLY 3 TIMES A DAY BEFORE MEALS, PLUS SLIDING SCALE. MDD 100 UNITS 12/03/17   [provider]  irbesartan-hydrochlorothiazide (AVALIDE) 300-12.5 MG tablet Take 1 tablet by mouth daily.    [provider]  levothyroxine (SYNTHROID, LEVOTHROID) 25 MCG tablet TAKE 1 TABLET EVERY DAY  EXCEPT TAKE 2 TABLETS  ON  SUNDAYS 01/28/18   Sowles, Drue Stager, MD  metFORMIN (GLUCOPHAGE-XR) 500 MG 24 hr tablet Take 500 mg by mouth. 2 tablets daily (total of 1000 mg daily) 12/03/17   [provider]  methocarbamol (ROBAXIN) 500 MG tablet Take 1 tablet (500 mg total) by mouth every 6 (six) hours as needed. 08/11/18   Johnn Hai, PA-C  metoprolol tartrate (LOPRESSOR) 25 MG tablet Take 1 tablet (25 mg total) by mouth 2 (two) times daily. 05/01/18   Steele Sizer, MD  Misc Natural Products (OSTEO BI-FLEX ADV JOINT SHIELD PO) Take by mouth 2 (two) times a week.    [provider]  montelukast  (SINGULAIR) 10 MG tablet Take 1 tablet (10 mg total) by mouth at bedtime. 05/01/18   Steele Sizer, MD  mupirocin ointment (BACTROBAN) 2 % Place 1 application into the nose 2 (two) times daily. 05/27/18   Steele Sizer, MD  Omega 3 1000 MG CAPS Take 1 capsule by mouth 2 (two) times daily.    [provider]  omeprazole (PRILOSEC) 40 MG capsule Take 1 capsule (40 mg total) by mouth daily. 08/07/18   Steele Sizer, MD  ondansetron (ZOFRAN ODT) 4 MG disintegrating tablet Take 1 tablet (4 mg total) by mouth every 8 (eight) hours as needed for nausea or vomiting. 08/11/18   Johnn Hai, PA-C  Potassium 99 MG TABS Take by mouth.    [provider]  predniSONE (DELTASONE) 10 MG tablet Take 6 tablets  today, on day 2 take 5 tablets, day 3 take 4 tablets, day 4 take 3 tablets, day 5 take  2 tablets and 1 tablet the last day 08/11/18   Madalyn Rob,  Nalani Andreen L, PA-C  pregabalin (LYRICA) 300 MG capsule Take 2 capsules (600 mg total) by mouth daily. Patient taking differently: Take 600 mg by mouth daily.  11/28/17   Steele Sizer, MD  Probiotic Product (PROBIOTIC-10 PO) Take by mouth.    [provider]  ranitidine (ZANTAC) 150 MG tablet Take 1 tablet (150 mg total) by mouth at bedtime. 04/10/18   Steele Sizer, MD  RELION INSULIN SYRINGE 1ML/31G 31G X 5/16" 1 ML MISC  12/19/15   [provider]  rosuvastatin (CRESTOR) 20 MG tablet Take 1 tablet (20 mg total) by mouth daily. 05/01/18   Steele Sizer, MD  traMADol (ULTRAM) 50 MG tablet Take 1 tablet (50 mg total) by mouth every 6 (six) hours as needed. 08/11/18   Johnn Hai, PA-C  vitamin C (ASCORBIC ACID) 500 MG tablet Take 500 mg by mouth 2 (two) times daily.     [provider]  VITAMIN D, CHOLECALCIFEROL, PO Take 1,000 Units by mouth daily.     [provider]    Allergies Codeine; Atorvastatin; Latex; and Zolpidem  Family History  Problem Relation Age of Onset  . Heart attack Mother   .  Aneurysm Mother   . Heart attack Maternal Grandfather   . Diabetes Maternal Grandfather     Social History Social History   Tobacco Use  . Smoking status: Never Smoker  . Smokeless tobacco: Never Used  Substance Use Topics  . Alcohol use: No    Alcohol/week: 0.0 - 1.0 standard drinks    Frequency: Never  . Drug use: No    Review of Systems Constitutional: Positive fever/chills Eyes: No visual changes. ENT: No sore throat. Cardiovascular: Denies chest pain. Respiratory: Denies shortness of breath. Gastrointestinal: No abdominal pain.  Positive nausea, no vomiting.  No diarrhea.  Genitourinary: Negative for dysuria. Musculoskeletal: Left foot pain.  History of sciatica with radiation left leg. Skin: Negative for rash. Neurological: Negative for headaches, focal weakness or numbness. ____________________________________________   PHYSICAL EXAM:  VITAL SIGNS: ED Triage Vitals [08/11/18 1210]  Enc Vitals Group     BP (!) 143/68     Pulse Rate 91     Resp 18     Temp 97.8 F (36.6 C)     Temp Source Oral     SpO2 97 %     Weight 251 lb 5.2 oz (114 kg)     Height _0  (1.575 m)     Head Circumference      Peak Flow      Pain Score 10     Pain Loc      Pain Edu?      Excl. in [place]?    Constitutional: Alert and oriented. Well appearing and in no acute distress. Eyes: Conjunctivae are normal. PERRL. EOMI. Head: Atraumatic. Nose: No congestion/rhinnorhea. Mouth/Throat: Mucous membranes are moist.  Oropharynx non-erythematous.  No erythema or exudate noted. Neck: No stridor.   Cardiovascular: Normal rate, regular rhythm. Grossly normal heart sounds.  Good peripheral circulation. Respiratory: Normal respiratory effort.  No retractions. Lungs CTAB. Gastrointestinal: Soft and nontender. No distention.  Musculoskeletal: There is no point tenderness on palpation of the lumbar spine.  There is however tenderness on palpation of the left SI joint area and surrounding tissue.   Range of motion with the left lower extremity increases patient's pain.  Patient has chronic changes with her left foot secondary to surgery. Neurologic:  Normal speech and language. No gross focal neurologic deficits are appreciated.  No gait instability. Skin:  Skin is warm, dry and intact. No rash noted. Psychiatric: Mood and affect are normal. Speech and behavior are normal.  ____________________________________________   LABS (all labs ordered are listed, but only abnormal results are displayed)  Labs Reviewed  INFLUENZA PANEL BY PCR (TYPE A & B)    PROCEDURES  Procedure(s) performed: None  Procedures  Critical Care performed: No  ____________________________________________   INITIAL IMPRESSION / ASSESSMENT AND PLAN / ED COURSE  As part of my medical decision making, I reviewed the following data within the electronic MEDICAL RECORD NUMBER Notes from prior ED visits and [place] Controlled Substance Database  Patient presents to the ED with complaint of left lower leg sciatica which she has experienced in the past.  She states that the pain possibly has made her nauseous but she also feels like she is coming down with something.  Influenza test was negative for both a and B.  Patient was made aware.  While in the ED she was given a prescription for Zofran ODT which improved her nausea and later was given oxycodone for pain.  Patient is encouraged to follow-up with her PCP for further management of her sciatica.  Patient is aware that most likely she has a virus and will increase fluids.  Patient was discharged with prescription for Robaxin 500 mg every 6 hours, tramadol which she is taken in the past, prednisone taper and Zofran as needed for nausea.  ____________________________________________   FINAL CLINICAL IMPRESSION(S) / ED DIAGNOSES  Final diagnoses:  Sciatica of left side  Viral illness     ED Discharge Orders         Ordered    methocarbamol (ROBAXIN) 500 MG tablet   Every 6 hours PRN     08/11/18 1531    traMADol (ULTRAM) 50 MG tablet  Every 6 hours PRN     08/11/18 1531    predniSONE (DELTASONE) 10 MG tablet     08/11/18 1531    ondansetron (ZOFRAN ODT) 4 MG disintegrating tablet  Every 8 hours PRN     08/11/18 1533           Note:  This document was prepared using Dragon voice recognition software and may include unintentional dictation errors.    Johnn Hai, PA-C 08/11/18 1638    Harvest Dark, MD 08/16/18 [phone]

## 2018-08-11 NOTE — ED Notes (Signed)
States not eating well since weds with chills no fever.

## 2018-08-11 NOTE — Discharge Instructions (Addendum)
Follow-up with your primary care provider.  Call make an appointment.  Begin taking medication only as directed.  Be aware that the methocarbamol and tramadol can cause drowsiness.  Begin taking prednisone as directed but eat prior to taking it as we discussed.  Zofran if needed for nausea.  Increase fluids for your viral illness.  You may take Tylenol if needed along with the medication that is already been prescribed.

## 2018-08-13 ENCOUNTER — Telehealth: Payer: Self-pay

## 2018-08-13 ENCOUNTER — Ambulatory Visit: Payer: Self-pay

## 2018-08-13 DIAGNOSIS — E1165 Type 2 diabetes mellitus with hyperglycemia: Secondary | ICD-10-CM

## 2018-08-13 DIAGNOSIS — E1140 Type 2 diabetes mellitus with diabetic neuropathy, unspecified: Secondary | ICD-10-CM

## 2018-08-13 DIAGNOSIS — N183 Chronic kidney disease, stage 3 unspecified: Secondary | ICD-10-CM

## 2018-08-13 DIAGNOSIS — I10 Essential (primary) hypertension: Secondary | ICD-10-CM

## 2018-08-13 DIAGNOSIS — E785 Hyperlipidemia, unspecified: Secondary | ICD-10-CM

## 2018-08-13 NOTE — Chronic Care Management (AMB) (Signed)
  Chronic Care Management Note    67 y.o. year old female referred to Chronic Care Management by Dr. Steele Sizer for  for assistance with chronic disease management, care coordination, and medication management.  Chronic conditions include HTN, HF, OSA, DM2, and Dyslipidemia . Last office visit with Steele Sizer, MD was 07/24/18.   Was unable to reach patient via telephone today for follow up to recent ED visit and progression towards established goals. I have left HIPAA compliant voicemail asking patient to return my call. (unsuccessful outreach #[phone]).  Plan: Will follow-up within 3-5  business days via telephone.     Summer [name] E. Rollene Rotunda, RN, BSN Nurse Care Coordinator [facility] / [facility] Care Management  [phone]

## 2018-08-20 ENCOUNTER — Ambulatory Visit: Payer: Medicare HMO

## 2018-08-20 DIAGNOSIS — E1140 Type 2 diabetes mellitus with diabetic neuropathy, unspecified: Secondary | ICD-10-CM

## 2018-08-20 DIAGNOSIS — E1165 Type 2 diabetes mellitus with hyperglycemia: Secondary | ICD-10-CM

## 2018-08-20 DIAGNOSIS — I10 Essential (primary) hypertension: Secondary | ICD-10-CM

## 2018-08-20 DIAGNOSIS — Z6841 Body Mass Index (BMI) 40.0 and over, adult: Secondary | ICD-10-CM

## 2018-08-20 NOTE — Chronic Care Management (AMB) (Signed)
Chronic Care Management   Follow Up Note   08/20/2018 Name: Summer [name] MRN: [id number] DOB: 1951-09-07  Referred by: Steele Sizer, MD Reason for referral : Chronic Care Management (follow up on ED visit and HTN)    Subjective: "I am not feeling well. I am still trying to recover from a stomach virus that sent me to the ED 10 days ago"   Objective:   Assessment: [name] is a 67 year old female primary care patient of Dr. Steele Sizer who was referred to the [facility] program for assistance with chronic disease management, care coordination, and medication management.   Goals Addressed    . COMPLETED: "I need help staying on track with my diabetes" (pt-stated)   On track    Summer [name] reports checking her blood sugar daily. Today she reports her fasting sugar as 168. She has been sick with a stomach virus since 12/10. She presented to the ED, received medications for N/V. She states she has not been nauseated or vomited in the last couple of days however she does not have an appetite. She understands that illness may increase her blood sugars and it is important for her to remain hydrated and continue her medications as prescribed unless told otherwise from her MD. She understands to call PCP if she is not feeling better by Thursday. When she is not sick, she trys to follow an ADA diet. She is in the process of receiving bariatric surgery at [facility] and will work closely with a dietitian as part of the program after the first of the year.  Clinical Goals: Over the next 30 days, patient will continue to verbalize compliance with recommended daily cbg check/documentation and will verbalize self assessed progress with adherence to carb modified diet changes as reviewed in CCM follow-up phone call.  Interventions: Provided emotional support and reassurance as it relates to patient's adherence to DM self care.     [place] Kitchen "I need motivation to get back in the gym" (pt-stated)   Not on track      Summer [name] is not on track with her exercise. She had good intentions to get back to the gym but as of late her attempt was failed her most recent GI illness. She will officially enroll in the [facility] in January and will begin an exercise routine as part of the program prescription.  Clinical Goals: Over the next 30 days, patient will verbalize engagement in physical activity as directed by the bariatric team.  Interventions: RN CM reinforced importance of exercise as it related to weight loss and reduction of blood glucose levels and overall improvement of cardiovascular health and wellbeing.    . "my kidney doctor said I need to control my blood pressure better" (pt-stated)   Not on track    Summer [name] has been taking her BP medication as prescribed however she has not felt well enough to take her BP on a regular basis. She has not taken her pressure since 10 days ago and she is unsure what her pressure was at that time. She has been encouraged to check and record her BP once she is feeling better.  Clinical Goals: Over the next 14 days, patient will verbalize importance of BP monitoring and hypertension medication compliance as it related to loss of kidney function.  Interventions: Discussed recent appointment with nephrologist. Reviewed newly added hypertension medication and s/s of low BP. Discussed importance of daily monitoring of BP and recording.  Plan: CCM RN CM will follow up with patient next week during her PCP visit.    Tarius Stangelo E. Rollene Rotunda, RN, BSN Nurse Care Coordinator [facility] Care Management  [phone]

## 2018-08-20 NOTE — Patient Instructions (Signed)
1. Please continue to check your blood sugars and document. 2. Drink lots of water to stay hydrated while you are recovering from your recent stomach illness 3. Please contact your PCP if you are not better by Thursday. 4. When you are feeling better, please check your blood pressure and document. This is important for your doctor to see how your new medication is working. 5. Contact your CCM Team with any questions, concerns, or needs  CCM (Chronic Care Management) Team   Trish Fountain RN, BSN Nurse Care Coordinator  [phone]  Ruben Reason PharmD  Clinical Pharmacist  [phone]  Goals Addressed    . COMPLETED: "I need help staying on track with my diabetes" (pt-stated)   On track      Clinical Goals: Over the next 30 days, patient will continue to verbalize compliance with recommended daily cbg check/documentation and will verbalize self assessed progress with adherence to carb modified diet changes as reviewed in CCM follow-up phone call.  Interventions: Provided emotional support and reassurance as it relates to patient's adherence to DM self care.     Marland Kitchen "I need motivation to get back in the gym" (pt-stated)   Not on track      Clinical Goals: Over the next 30 days, patient will verbalize engagement in physical activity as directed by the bariatric team.  Interventions: RN CM reinforced importance of exercise as it related to weight loss and reduction of blood glucose levels and overall improvement of cardiovascular health and wellbeing.    . "my kidney doctor said I need to control my blood pressure better" (pt-stated)   Not on track     Clinical Goals: Over the next 14 days, patient will verbalize importance of BP monitoring and hypertension medication compliance as it related to loss of kidney function.  Interventions: Discussed recent appointment with nephrologist. Reviewed newly added hypertension medication and s/s of low BP. Discussed importance of daily  monitoring of BP and recording.   The patient verbalized understanding of instructions provided today and declined a print copy of patient instruction materials.

## 2018-08-21 ENCOUNTER — Other Ambulatory Visit (HOSPITAL_COMMUNITY): Payer: Self-pay | Admitting: Surgery

## 2018-08-22 ENCOUNTER — Other Ambulatory Visit: Payer: Self-pay | Admitting: Surgery

## 2018-08-27 ENCOUNTER — Ambulatory Visit (INDEPENDENT_AMBULATORY_CARE_PROVIDER_SITE_OTHER): Payer: Medicare HMO | Admitting: Family Medicine

## 2018-08-27 ENCOUNTER — Encounter: Payer: Self-pay | Admitting: Family Medicine

## 2018-08-27 VITALS — BP 130/60 | HR 105 | Temp 97.9°F | Resp 18 | Ht 62.0 in | Wt 249.7 lb

## 2018-08-27 DIAGNOSIS — I10 Essential (primary) hypertension: Secondary | ICD-10-CM | POA: Diagnosis not present

## 2018-08-27 DIAGNOSIS — F331 Major depressive disorder, recurrent, moderate: Secondary | ICD-10-CM

## 2018-08-27 DIAGNOSIS — N183 Chronic kidney disease, stage 3 unspecified: Secondary | ICD-10-CM

## 2018-08-27 DIAGNOSIS — Z794 Long term (current) use of insulin: Secondary | ICD-10-CM | POA: Diagnosis not present

## 2018-08-27 DIAGNOSIS — E1169 Type 2 diabetes mellitus with other specified complication: Secondary | ICD-10-CM | POA: Diagnosis not present

## 2018-08-27 DIAGNOSIS — E1143 Type 2 diabetes mellitus with diabetic autonomic (poly)neuropathy: Secondary | ICD-10-CM

## 2018-08-27 DIAGNOSIS — E785 Hyperlipidemia, unspecified: Secondary | ICD-10-CM

## 2018-08-27 DIAGNOSIS — K219 Gastro-esophageal reflux disease without esophagitis: Secondary | ICD-10-CM

## 2018-08-27 DIAGNOSIS — E039 Hypothyroidism, unspecified: Secondary | ICD-10-CM | POA: Diagnosis not present

## 2018-08-27 LAB — POCT GLYCOSYLATED HEMOGLOBIN (HGB A1C)
HbA1c POC (<> result, manual entry): 8.1 % (ref 4.0–5.6)
HbA1c, POC (controlled diabetic range): 8.1 % — AB (ref 0.0–7.0)
HbA1c, POC (prediabetic range): 8.1 % — AB (ref 5.7–6.4)
Hemoglobin A1C: 8.1 % — AB (ref 4.0–5.6)

## 2018-08-27 MED ORDER — DULOXETINE HCL 60 MG PO CPEP: 60.0000 mg | ORAL_CAPSULE | Freq: Every day | ORAL | 1 refills | Status: DC

## 2018-08-27 MED ORDER — METOPROLOL TARTRATE 25 MG PO TABS: 25.0000 mg | ORAL_TABLET | Freq: Two times a day (BID) | ORAL | 1 refills | Status: DC

## 2018-08-27 MED ORDER — ROSUVASTATIN CALCIUM 20 MG PO TABS: 20.0000 mg | ORAL_TABLET | Freq: Every day | ORAL | 1 refills | Status: DC

## 2018-08-27 MED ORDER — ICOSAPENT ETHYL 1 G PO CAPS: 2.0000 g | ORAL_CAPSULE | Freq: Two times a day (BID) | ORAL | 1 refills | Status: DC

## 2018-08-27 NOTE — Progress Notes (Signed)
Name: Summer [name]   MRN: [id number]    DOB: April 21, 1951   Date:08/27/2018       Progress Note  Subjective  Chief Complaint  Chief Complaint  Patient presents with  . Diabetes  . Depression  . Obesity  . Hyperlipidemia  . Hypertension    HPI  DMII: she is seeing Dr. Manfred Shirts, she did not remind CMA and hgbA1C was done in our office again  Today, and it is 8.1% . She is really trying to lose weight and plans on having bariatric surgery Summer of 2020. She is getting 70/30 insulin , and Lyrica through medication assistance program. She is also taking Metformin. She has polyphagia, polydipsia and   nocturia, eating during the night. She is trying to decrease portion size.   CKI III: she is under the care of [facility] and is getting monitored , they recommended weight loss medication.   HTN: SBP still above goal, in the 140's, it improves with rest. She has SOB with activity, no chest pain. She states leg not as swollen , she was given norvasc in Nov by nephrologist   Major Depression: still feels down all the time, Phq9 is very high at 25, she went to psychiatrist and also therapist but cannot afford going back because of cost.She is very depressed still and it is affecting her health since she does not seem to care about getting diabetes at goal. I gave her duloxetin last visit, she states the medication helps some but she was sick and unable to work , she starting to feel better again. She has occasional suicidal thoughts, she states she would not do it. Discussed suicide hotline, she states she is exercise about the possibility of having bariatric surgery and losing weight.   Hyperlipidemia: continue crestor, no muscle aches, has joint aches from OA, . Triglycerides very high, we will try adding vascepa   Morbid obesity: high bmi and co-morbidities. She has DM, OA, GERD, HTN, she is going to [facility] and plans on having surgery next year, tried  multiple diets in the past, difficulty exercising because of SOB and joint pains. She joined [employer] and was going to gym 3 days a week, she was using a elipitical   Hypothyroidism: taking one daily and two on sundays, no hair loss, no dry skin, no change in bowel movements. Unchanged     Patient Active Problem List   Diagnosis Date Noted  . (HFpEF) heart failure with preserved ejection fraction ([place]) 08/31/2017  . Moderate persistent asthma 08/31/2017  . Hyperlipidemia due to type 2 diabetes mellitus ([place]) 06/14/2017  . Essential hypertension 10/06/2016  . Morbid obesity with BMI of 40.0-44.9, adult ([place]) 10/06/2016  . Dyslipidemia associated with type 2 diabetes mellitus ([place]) 08/18/2016  . Charcot foot due to diabetes mellitus ([place]) 02/22/2016  . Hypertriglyceridemia 07/27/2015  . Acquired abduction deformity of foot 07/12/2015  . Osteoarthritis of subtalar joint 07/12/2015  . Poorly controlled type 2 diabetes mellitus with neuropathy ([place]) 07/12/2015  . Arthritis of foot, degenerative 07/12/2015  . Carpal tunnel syndrome 04/17/2015  . Chronic constipation 04/17/2015  . Insomnia, persistent 04/17/2015  . Chronic kidney disease (CKD), stage III (moderate) ([place]) 04/17/2015  . Decreased exercise tolerance 04/17/2015  . Diabetes mellitus with polyneuropathy ([place]) 04/17/2015  . Gastro-esophageal reflux disease without esophagitis 04/17/2015  . Bursitis, trochanteric 04/17/2015  . Cephalalgia 04/17/2015  . Benign hypertension 04/17/2015  . Adult hypothyroidism 04/17/2015  . Hearing loss 04/17/2015  .  Chronic recurrent major depressive disorder ([place]) 04/17/2015  . Neurogenic claudication 04/17/2015  . Obesity (BMI 35.0-39.9 without comorbidity) 04/17/2015  . Hypo-ovarianism 04/17/2015  . Perennial allergic rhinitis with seasonal variation 04/17/2015  . Acne erythematosa 04/17/2015  . Dyskinesia, tardive 04/17/2015  . Memory loss 04/17/2015  . Impingement syndrome of  shoulder 04/17/2015  . Dermatitis, stasis 04/17/2015  . Obstructive sleep apnea 05/14/2014  . Dyspnea 05/06/2014  . Hyperlipidemia 02/06/2012  . LBP (low back pain) 09/16/2008    Past Surgical History:  Procedure Laterality Date  . ANKLE SURGERY Left approx Jan 2018  . CATARACT EXTRACTION  01/2011   right  . eye lid surgery  2013   bilateral  . FOOT SURGERY    . NECK SURGERY    . TUBAL LIGATION    . VAGINAL HYSTERECTOMY  1989    Family History  Problem Relation Age of Onset  . Heart attack Mother   . Aneurysm Mother   . Heart attack Maternal Grandfather   . Diabetes Maternal Grandfather     Social History   Socioeconomic History  . Marital status: Significant Other    Spouse name: Not on file  . Number of children: 1  . Years of education: Not on file  . Highest education level: High school graduate  Occupational History  . Occupation: retired  Scientific laboratory technician  . Financial resource strain: Not hard at all  . Food insecurity:    Worry: Never true    Inability: Never true  . Transportation needs:    Medical: No    Non-medical: No  Tobacco Use  . Smoking status: Never Smoker  . Smokeless tobacco: Never Used  Substance and Sexual Activity  . Alcohol use: No    Alcohol/week: 0.0 - 1.0 standard drinks    Frequency: Never  . Drug use: No  . Sexual activity: Yes    Partners: Male  Lifestyle  . Physical activity:    Days per week: 0 days    Minutes per session: 0 min  . Stress: Rather much  Relationships  . Social connections:    Talks on phone: More than three times a week    Gets together: Once a week    Attends religious service: Never    Active member of club or organization: No    Attends meetings of clubs or organizations: Never    Relationship status: Living with partner  . Intimate partner violence:    Fear of current or ex partner: No    Emotionally abused: No    Physically abused: No    Forced sexual activity: No  Other Topics Concern  . Not on  file  Social History Narrative   Son lives in [place] (retired from [employer]) and her granddaughter (20yr old) [place].     Current Outpatient Medications:  .  ACCU-CHEK SOFTCLIX LANCETS lancets, , Disp: , Rfl:  .  acetaminophen (TYLENOL 8 HOUR) 650 MG CR tablet, Take 1 tablet (650 mg total) by mouth every 8 (eight) hours as needed for pain., Disp: 90 tablet, Rfl: 0 .  albuterol (PROVENTIL HFA) 108 (90 Base) MCG/ACT inhaler, Inhale 2 puffs into the lungs 2 (two) times daily as needed for shortness of breath. , Disp: , Rfl:  .  Alcohol Swabs (B-D SINGLE USE SWABS REGULAR) PADS, , Disp: , Rfl:  .  ASPIRIN LOW DOSE 81 MG EC tablet, TAKE 1 TABLET (81 MG TOTAL) BY MOUTH DAILY., Disp: 30 tablet,  Rfl: 0 .  Blood Glucose Monitoring Suppl (ACCU-CHEK AVIVA PLUS) w/Device KIT, , Disp: , Rfl:  .  DULoxetine (CYMBALTA) 60 MG capsule, Take 1 capsule (60 mg total) by mouth daily., Disp: 90 capsule, Rfl: 0 .  furosemide (LASIX) 20 MG tablet, Take 1 tablet (20 mg total) by mouth daily., Disp: 90 tablet, Rfl: 1 .  Glucose Blood (BLOOD GLUCOSE TEST STRIPS) STRP, , Disp: , Rfl:  .  insulin NPH-regular Human (70-30) 100 UNIT/ML injection, Inject 90 Units into the skin 2 (two) times daily with a meal., Disp: , Rfl:  .  insulin regular (NOVOLIN R) 100 units/mL injection, INJECT 50 UNITS SUBCTANEOUSLY 3 TIMES A DAY BEFORE MEALS, PLUS SLIDING SCALE. MDD 100 UNITS, Disp: , Rfl:  .  irbesartan-hydrochlorothiazide (AVALIDE) 300-12.5 MG tablet, Take 1 tablet by mouth daily., Disp: , Rfl:  .  levothyroxine (SYNTHROID, LEVOTHROID) 25 MCG tablet, TAKE 1 TABLET EVERY DAY  EXCEPT TAKE 2 TABLETS  ON  SUNDAYS, Disp: 100 tablet, Rfl: 0 .  metFORMIN (GLUCOPHAGE-XR) 500 MG 24 hr tablet, Take 500 mg by mouth. 2 tablets daily (total of 1000 mg daily), Disp: , Rfl:  .  methocarbamol (ROBAXIN) 500 MG tablet, Take 1 tablet (500 mg total) by mouth every 6 (six) hours as needed., Disp: 15 tablet, Rfl: 0 .   metoprolol tartrate (LOPRESSOR) 25 MG tablet, Take 1 tablet (25 mg total) by mouth 2 (two) times daily., Disp: 180 tablet, Rfl: 1 .  Misc Natural Products (OSTEO BI-FLEX ADV JOINT SHIELD PO), Take by mouth 2 (two) times a week., Disp: , Rfl:  .  montelukast (SINGULAIR) 10 MG tablet, Take 1 tablet (10 mg total) by mouth at bedtime., Disp: 90 tablet, Rfl: 1 .  mupirocin ointment (BACTROBAN) 2 %, Place 1 application into the nose 2 (two) times daily., Disp: 22 g, Rfl: 0 .  Omega 3 1000 MG CAPS, Take 1 capsule by mouth 2 (two) times daily., Disp: , Rfl:  .  omeprazole (PRILOSEC) 40 MG capsule, Take 1 capsule (40 mg total) by mouth daily., Disp: 90 capsule, Rfl: 0 .  ondansetron (ZOFRAN ODT) 4 MG disintegrating tablet, Take 1 tablet (4 mg total) by mouth every 8 (eight) hours as needed for nausea or vomiting., Disp: 12 tablet, Rfl: 0 .  Potassium 99 MG TABS, Take by mouth., Disp: , Rfl:  .  predniSONE (DELTASONE) 10 MG tablet, Take 6 tablets  today, on day 2 take 5 tablets, day 3 take 4 tablets, day 4 take 3 tablets, day 5 take  2 tablets and 1 tablet the last day, Disp: 21 tablet, Rfl: 0 .  pregabalin (LYRICA) 300 MG capsule, Take 2 capsules (600 mg total) by mouth daily. (Patient taking differently: Take 600 mg by mouth daily. ), Disp: 180 capsule, Rfl: 1 .  Probiotic Product (PROBIOTIC-10 PO), Take by mouth., Disp: , Rfl:  .  ranitidine (ZANTAC) 150 MG tablet, Take 1 tablet (150 mg total) by mouth at bedtime., Disp: 90 tablet, Rfl: 1 .  RELION INSULIN SYRINGE 1ML/31G 31G X 5/16" 1 ML MISC, , Disp: , Rfl:  .  rosuvastatin (CRESTOR) 20 MG tablet, Take 1 tablet (20 mg total) by mouth daily., Disp: 90 tablet, Rfl: 1 .  traMADol (ULTRAM) 50 MG tablet, Take 1 tablet (50 mg total) by mouth every 6 (six) hours as needed., Disp: 12 tablet, Rfl: 0 .  vitamin C (ASCORBIC ACID) 500 MG tablet, Take 500 mg by mouth 2 (two) times daily. , Disp: ,  Rfl:  .  VITAMIN D, CHOLECALCIFEROL, PO, Take 1,000 Units by mouth  daily. , Disp: , Rfl:   Allergies  Allergen Reactions  . Codeine Other (See Comments)    "TRIPPED OUT"  DIDN'T LIKE THE [place]  . Atorvastatin     muscle pain  . Latex Rash  . Zolpidem Other (See Comments)    Sleep walk    I personally reviewed active problem list, medication list, allergies, family history, social history with the patient/caregiver today.   ROS  Constitutional: Negative for fever or weight change.  Respiratory: Negative for cough, positive for  shortness of breath.   Cardiovascular: Negative for chest pain or palpitations.  Gastrointestinal: Negative for abdominal pain, no bowel changes.  Musculoskeletal: Positive for gait problem and intermittent  joint swelling.  Skin: Negative for rash.  Neurological: Negative for dizziness or headache.  No other specific complaints in a complete review of systems (except as listed in HPI above).  Objective  Vitals:   08/27/18 1445  BP: (!) 148/76  Pulse: (!) 126  Resp: 18  Temp: 97.9 F (36.6 C)  TempSrc: Oral  SpO2: 95%  Weight: 249 lb 11.2 oz (113.3 kg)  Height: _0  (1.575 m)    Body mass index is 45.67 kg/m.  Physical Exam  Constitutional: Patient appears well-developed and well-nourished. Obese  No distress.  HEENT: head atraumatic, normocephalic, pupils equal and reactive to light, neck supple, throat within normal limits Cardiovascular: Normal rate, regular rhythm and normal heart sounds.  No murmur heard. No BLE edema. Pulmonary/Chest: Effort normal and breath sounds normal. No respiratory distress. Abdominal: Soft.  There is no tenderness. Psychiatric: Patient has a normal mood and affect. behavior is normal. Judgment and thought content normal.  Recent Results (from the past 2160 hour(s))  HM DIABETES EYE EXAM     Status: None   Collection Time: 06/19/18 12:00 AM  Result Value Ref Range   HM Diabetic Eye Exam No Retinopathy No Retinopathy    Comment: [facility], Dr. George Ina   Influenza panel by PCR (type A & B)     Status: None   Collection Time: 08/11/18  1:57 PM  Result Value Ref Range   Influenza A By PCR NEGATIVE NEGATIVE   Influenza B By PCR NEGATIVE NEGATIVE    Comment: (NOTE) The Xpert Xpress Flu assay is intended as an aid in the diagnosis of  influenza and should not be used as a sole basis for treatment.  This  assay is FDA approved for nasopharyngeal swab specimens only. Nasal  washings and aspirates are unacceptable for Xpert Xpress Flu testing. Performed at [facility], [address] [place], [place] [address]   POCT HgB A1C     Status: Abnormal   Collection Time: 08/27/18  2:56 PM  Result Value Ref Range   Hemoglobin A1C 8.1 (A) 4.0 - 5.6 %   HbA1c POC (<> result, manual entry) 8.1 4.0 - 5.6 %   HbA1c, POC (prediabetic range) 8.1 (A) 5.7 - 6.4 %   HbA1c, POC (controlled diabetic range) 8.1 (A) 0.0 - 7.0 %      PHQ2/9: Depression screen [facility] 2/9 08/27/2018 07/26/2018 07/24/2018 06/03/2018 05/27/2018  Decreased Interest _1 Down, Depressed, Hopeless _2 PHQ - 2 Score _3 Altered sleeping 3 0 0 2 2  Tired, decreased energy _4 Change in appetite 3  _0 Feeling bad or failure about yourself  _1 Trouble concentrating _2 Moving slowly or fidgety/restless _3 Suicidal thoughts 3 0 0 3 3  PHQ-9 Score _4 Difficult doing work/chores Extremely dIfficult - Not difficult at all - Very difficult  Some recent data might be hidden     Fall Risk: Fall Risk  07/26/2018 07/24/2018 06/11/2018 06/03/2018 05/27/2018  Falls in the past year? 0 0 No No No  Comment - - - - -  Number falls in past yr: 0 - - - -  Injury with Fall? - - - - -  Risk Factor Category  - - - - -  Comment - - - - -  Risk for fall due to : - - Impaired balance/gait;Impaired mobility - -  Risk for fall due to: Comment - - - - -  Follow up - - - - -      Assessment & Plan  1. Type 2 diabetes  mellitus with diabetic autonomic neuropathy, with long-term current use of insulin ([name])  - POCT HgB A1C  2. MDD (major depressive disorder), recurrent episode, moderate ([name])  - DULoxetine (CYMBALTA) 60 MG capsule; Take 1 capsule (60 mg total) by mouth daily.  Dispense: 90 capsule; Refill: 1  3. Dyslipidemia associated with type 2 diabetes mellitus ([name])  - rosuvastatin (CRESTOR) 20 MG tablet; Take 1 tablet (20 mg total) by mouth daily.  Dispense: 90 tablet; Refill: 1  4. GERD without esophagitis  Off Ranitidine   5. Benign hypertension  - metoprolol tartrate (LOPRESSOR) 25 MG tablet; Take 1 tablet (25 mg total) by mouth 2 (two) times daily.  Dispense: 180 tablet; Refill: 1  6. Morbid (severe) obesity due to excess calories ([place])   7. Chronic kidney disease (CKD), stage III (moderate) ([name])  Keep follow up with nephrologist   8. Adult hypothyroidism  Last TSH at goal

## 2018-08-28 ENCOUNTER — Ambulatory Visit (INDEPENDENT_AMBULATORY_CARE_PROVIDER_SITE_OTHER): Payer: Medicare HMO | Admitting: Pharmacist

## 2018-08-28 DIAGNOSIS — E1159 Type 2 diabetes mellitus with other circulatory complications: Secondary | ICD-10-CM | POA: Diagnosis not present

## 2018-08-28 DIAGNOSIS — E785 Hyperlipidemia, unspecified: Secondary | ICD-10-CM

## 2018-08-28 DIAGNOSIS — Z794 Long term (current) use of insulin: Secondary | ICD-10-CM | POA: Diagnosis not present

## 2018-08-28 DIAGNOSIS — E1143 Type 2 diabetes mellitus with diabetic autonomic (poly)neuropathy: Secondary | ICD-10-CM

## 2018-08-28 DIAGNOSIS — E1169 Type 2 diabetes mellitus with other specified complication: Secondary | ICD-10-CM | POA: Diagnosis not present

## 2018-08-28 DIAGNOSIS — E1142 Type 2 diabetes mellitus with diabetic polyneuropathy: Secondary | ICD-10-CM | POA: Diagnosis not present

## 2018-08-28 DIAGNOSIS — E039 Hypothyroidism, unspecified: Secondary | ICD-10-CM | POA: Diagnosis not present

## 2018-08-28 DIAGNOSIS — I10 Essential (primary) hypertension: Secondary | ICD-10-CM | POA: Diagnosis not present

## 2018-08-28 NOTE — Chronic Care Management (AMB) (Signed)
  Chronic Care Management   Follow Up Note   08/28/2018 Name: Summer [name] MRN: [id number] DOB: [birth year]  Referred by: Steele Sizer, MD Reason for referral : Chronic Care Management (Medication Assistance)    Subjective: "I need help paying for this new medication"   Objective:  Vascepa 1 g capsule, take 2 capsules PO BID   Assessment: Patient continues to have high triglyceride levels despite treatment with rosuvastatin. Dr. Ancil Boozer prescribed Vascepa at office visit yesterday. This medication is a tier 4 on patient's formulary and is very expensive. Through medication assistance assessment, CCM pharmacist found an open disease fund for hyperlipidemia through the [employer].  Goals Addressed            This Visit's Progress   . "I need help paying for this new medication" (pt-stated)       Clinical Goal(s): Over the next 10 days, Summer [name] will provide the necessary supplementary documents (proof of out of pocket prescription expenditure, proof of household income) needed for medication assistance applications to CCM pharmacist.   Interventions:  CCM pharmacist will apply for assistance for Vascepa made by [employer].   CCM pharmacist will collaborate with primary care physician to complete a tier exception request to lower the tier for Vascepa on patient's formulary, helping to lower copay cost.          Plan:  - applied for grant through [employer]; expect mail over the course of 2 weeks from [employer] stating status of application -CCM pharmacist will work with Dr. Ancil Boozer to complete Tier Exception request and any documentation [place] needs  Follow up in 2 weeks  Ruben Reason, PharmD Clinical Pharmacist [place] [phone]

## 2018-08-28 NOTE — Patient Instructions (Signed)
1. CCM pharmacist will assist you in applying for a grant for [place] from [employer].   2. CCM pharmacist will collaborate with Dr. Ancil Boozer to complete a tier exception request for your Vascepa.   Please call a member of the CCM (Chronic Care Management) Team with any questions or case management needs:   Vanetta Mulders, BSN Nurse Care Coordinator  [phone]  Ruben Reason, PharmD  Clinical Pharmacist  [phone]  The patient verbalized understanding of instructions provided today and declined a print copy of patient instruction materials.

## 2018-08-29 ENCOUNTER — Ambulatory Visit
Admission: RE | Admit: 2018-08-29 | Discharge: 2018-08-29 | Disposition: A | Discharge status: Home / Self Care | Payer: Medicare HMO | Source: Ambulatory Visit | Attending: Surgery | Admitting: Surgery

## 2018-08-29 DIAGNOSIS — K449 Diaphragmatic hernia without obstruction or gangrene: Secondary | ICD-10-CM | POA: Diagnosis not present

## 2018-08-29 DIAGNOSIS — K219 Gastro-esophageal reflux disease without esophagitis: Secondary | ICD-10-CM | POA: Diagnosis not present

## 2018-08-30 ENCOUNTER — Ambulatory Visit: Payer: Self-pay | Admitting: Pharmacist

## 2018-08-30 DIAGNOSIS — E785 Hyperlipidemia, unspecified: Principal | ICD-10-CM

## 2018-08-30 DIAGNOSIS — E1169 Type 2 diabetes mellitus with other specified complication: Secondary | ICD-10-CM

## 2018-08-30 DIAGNOSIS — E1143 Type 2 diabetes mellitus with diabetic autonomic (poly)neuropathy: Secondary | ICD-10-CM

## 2018-08-30 DIAGNOSIS — Z794 Long term (current) use of insulin: Secondary | ICD-10-CM

## 2018-08-30 NOTE — Chronic Care Management (AMB) (Signed)
  Chronic Care Management   Follow Up Note   08/30/2018 Name: Summer [name] MRN: [id number] DOB: July 11, 1951  Referred by: Steele Sizer, MD Reason for referral : Chronic Care Management (Vascepa- Medication assistance)    Subjective:  "I need help paying for this new medication"   Assessment: Patient was recently prescribed Vascepa, a Tier 4 medication on her formulary, to help reduce TG levels. CCM pharmacist researched available medication assistance programs and applied to [employer] for a grant for [place] on 08/28/18. Received fax to the office for [employer] that grant had been approved. Placed call today to update patient on this application.   PHARMACY ID [id number] PC GROUP [id number] PC BIN [id number] PC PCN [id number] PC PROCESSOR PDMI FUND Hypercholesterolemia - Medicare Access  Effective 07/29/18 to 07/29/19  Goals Addressed            This Visit's Progress   . "I need help paying for this new medication" (pt-stated)   On track    Clinical Goal(s): Over the next 10 days, Summer [name] will provide the necessary supplementary documents (proof of out of pocket prescription expenditure, proof of household income) needed for medication assistance applications to CCM pharmacist.   Interventions:  CCM pharmacist will apply for assistance for Vascepa made by [employer].   CCM pharmacist will collaborate with primary care physician to complete a tier exception request to lower the tier for Vascepa on patient's formulary, helping to lower copay cost.         Plan:  CCM pharmacist to continue to pursue Tier Exception request. Follow up in approximately 2 weeks  Ruben Reason, PharmD Clinical Pharmacist [place] [phone]

## 2018-09-10 ENCOUNTER — Telehealth: Payer: Self-pay | Admitting: Family Medicine

## 2018-09-10 DIAGNOSIS — J4541 Moderate persistent asthma with (acute) exacerbation: Secondary | ICD-10-CM

## 2018-09-10 NOTE — Telephone Encounter (Signed)
Copied from [place] [id number]. Topic: Quick Communication - See Telephone Encounter >> Sep 10, 2018  2:21 PM Vernona Rieger wrote: CRM for notification. See Telephone encounter for: 09/10/18. Patient said her montelukast (SINGULAIR) 10 MG tablet was denied and her irbesartan-hydrochlorothiazide (AVALIDE) 300-12.5 MG tablet and would like to know why. She said that she uses [facility] now for her medications. Please Advise

## 2018-09-11 MED ORDER — IRBESARTAN-HYDROCHLOROTHIAZIDE 300-12.5 MG PO TABS: 1.0000 | ORAL_TABLET | Freq: Every day | ORAL | 1 refills | Status: DC

## 2018-09-11 MED ORDER — MONTELUKAST SODIUM 10 MG PO TABS: 10.0000 mg | ORAL_TABLET | Freq: Every day | ORAL | 1 refills | Status: DC

## 2018-09-11 NOTE — Telephone Encounter (Signed)
Sent to [facility]

## 2018-09-12 ENCOUNTER — Ambulatory Visit: Payer: Medicare HMO | Admitting: Pharmacist

## 2018-09-12 DIAGNOSIS — E785 Hyperlipidemia, unspecified: Principal | ICD-10-CM

## 2018-09-12 DIAGNOSIS — E1143 Type 2 diabetes mellitus with diabetic autonomic (poly)neuropathy: Secondary | ICD-10-CM

## 2018-09-12 DIAGNOSIS — E1169 Type 2 diabetes mellitus with other specified complication: Secondary | ICD-10-CM

## 2018-09-12 DIAGNOSIS — Z794 Long term (current) use of insulin: Secondary | ICD-10-CM

## 2018-09-12 NOTE — Chronic Care Management (AMB) (Signed)
  Chronic Care Management   Note  09/12/2018 Name: Summer [name] MRN: [id number] DOB: August 24, 1951   Follow up call placed to patient regarding Vascepa and [place].  Patient stated that she was in the hospital visiting a relative and asked if another call could be arranged.   I will call Summer [name] tomorrow.   Ruben Reason, PharmD Clinical Pharmacist [facility] [phone]

## 2018-09-13 ENCOUNTER — Ambulatory Visit: Payer: Self-pay | Admitting: Pharmacist

## 2018-09-13 ENCOUNTER — Telehealth: Payer: Self-pay

## 2018-09-13 DIAGNOSIS — E1143 Type 2 diabetes mellitus with diabetic autonomic (poly)neuropathy: Secondary | ICD-10-CM

## 2018-09-13 DIAGNOSIS — Z794 Long term (current) use of insulin: Secondary | ICD-10-CM

## 2018-09-13 DIAGNOSIS — E1169 Type 2 diabetes mellitus with other specified complication: Secondary | ICD-10-CM

## 2018-09-13 DIAGNOSIS — E785 Hyperlipidemia, unspecified: Principal | ICD-10-CM

## 2018-09-13 NOTE — Chronic Care Management (AMB) (Signed)
  Chronic Care Management   Note  09/13/2018 Name: CHANAY NUGENT MRN: [id number] DOB: 1951/08/11   Follow up call placed to patient today regarding Vascepa and [employer].   Was unable to reach patient via telephone today and have left HIPAA compliant voicemail asking patient to return my call. (unsuccessful outreach #2).  Plan: Will follow-up within 3-5  business days via telephone.    Ruben Reason, PharmD Clinical Pharmacist [facility] [phone]

## 2018-09-17 ENCOUNTER — Telehealth: Payer: Self-pay | Admitting: Pharmacist

## 2018-09-17 ENCOUNTER — Telehealth: Payer: Self-pay | Admitting: Family Medicine

## 2018-09-17 NOTE — Telephone Encounter (Signed)
Medication was sent to [facility] on 08/29/2018. Called patient and she is aware.

## 2018-09-17 NOTE — Telephone Encounter (Signed)
Copied from [place] [id number]. Topic: Quick Communication - Rx Refill/Question >> Sep 17, 2018  1:32 PM Colvin Caroli N wrote: Medication: DULoxetine (CYMBALTA) 60 MG capsule  Patient is requesting a refill of this medication.   Has the patient contacted their pharmacy? Yes, patient was advised to contact office.   Preferred Pharmacy (with phone number or street name): [facility] [address], [place] - [place] [phone] (Phone) [phone] (Fax)

## 2018-09-18 ENCOUNTER — Ambulatory Visit (INDEPENDENT_AMBULATORY_CARE_PROVIDER_SITE_OTHER): Payer: Medicare HMO | Admitting: Pharmacist

## 2018-09-18 ENCOUNTER — Encounter: Payer: Self-pay | Admitting: Dietician

## 2018-09-18 ENCOUNTER — Encounter: Payer: Medicare HMO | Attending: Surgery | Admitting: Dietician

## 2018-09-18 VITALS — Ht 62.0 in | Wt 254.9 lb

## 2018-09-18 DIAGNOSIS — E119 Type 2 diabetes mellitus without complications: Secondary | ICD-10-CM | POA: Diagnosis not present

## 2018-09-18 DIAGNOSIS — E785 Hyperlipidemia, unspecified: Secondary | ICD-10-CM

## 2018-09-18 DIAGNOSIS — E1169 Type 2 diabetes mellitus with other specified complication: Secondary | ICD-10-CM | POA: Diagnosis not present

## 2018-09-18 DIAGNOSIS — Z713 Dietary counseling and surveillance: Secondary | ICD-10-CM | POA: Diagnosis not present

## 2018-09-18 DIAGNOSIS — Z6841 Body Mass Index (BMI) 40.0 and over, adult: Secondary | ICD-10-CM | POA: Diagnosis not present

## 2018-09-18 DIAGNOSIS — E1143 Type 2 diabetes mellitus with diabetic autonomic (poly)neuropathy: Secondary | ICD-10-CM

## 2018-09-18 DIAGNOSIS — Z794 Long term (current) use of insulin: Secondary | ICD-10-CM | POA: Diagnosis not present

## 2018-09-18 NOTE — Chronic Care Management (AMB) (Signed)
  Chronic Care Management   Follow Up Note   09/18/2018 Name: Summer [name] MRN: [id number] DOB: 09/23/1950  Referred by: Steele Sizer, MD Reason for referral : No chief complaint on file.   Subjective: "I haven't used the foundation grant yet"   Assessment: Outreach call to Summer [name] today to confirm successful use of [place] for [employer]. HIPAA identifiers verified. Summer [name] states that she has not yet picked up her Vascepa, but she plans to tomorrow. She verifies that she will call CCM pharmacist if there is any issue with the grant and the pharmacy. CCM pharmacist reviewed with patient that the pharmacy is to run the grant as though it were insurance for [place], and the copay would automatically deduct.   CCM pharmacist additionally contacted [facility] for a Tier Exception request to be faxed to office. Will complete with the help of Dr. Ancil Boozer and fax back. Contacted Vascepa representative for further information about Tier Exception requests. Janett Billow (Vascepa rep) provided CCM pharmacist with prescribing information and documents that will support a Tier Exception request.   Goals Addressed            This Visit's Progress   . "I need help paying for this new medication" (pt-stated)   On track    Clinical Goal(s): Over the next 10 days, Summer [name] will provide the necessary supplementary documents (proof of out of pocket prescription expenditure, proof of household income) needed for medication assistance applications to CCM pharmacist.   Interventions:  CCM pharmacist will apply for assistance for Vascepa made by [employer].   CCM pharmacist will collaborate with primary care physician to complete a tier exception request to lower the tier for Vascepa on patient's formulary, helping to lower copay cost.          Plan: Complete paperwork with Dr. Ancil Boozer and fax back to [facility]. Determination will occur in 24-72 hours after fax received.    Ruben Reason, PharmD Clinical Pharmacist [facility] [phone]

## 2018-09-18 NOTE — Progress Notes (Signed)
Nutrition Assessment  Proposed Surgery: Sleeve Gastrectomy  MD: Johnathan Hausen RD: Erlene Quan  Height: 5'2" Weight: 254.9lbs BMI: 46.6 Upper IBW% (UIBW): 209% (IBW 121lbs)  Patient's Goal Weight: 145lbs  Medical History: HTN, hyperlipidemia, Type 2 diabetes, sleep apnea, GERD Medications and Supplements: acetaminophen prn, amLODipine, low dose aspirin, DULoxetine, furosemide, insulin NPH 70/30, irbesartan-HCTZ, levothyroxine, metFORMIN, metoprolol tartrate, Osteo-bi-flex supplement, montelukast, Omega-3 oil, omeprazole, Potassium, pregabalin, protiotic, rosuvastatin, vitamin C, vitamin D  Previous surgeries: 2x ankle surgery followed by 6 month recovery; partial hysterectomy, neck surgery many years ago; hernia repair Drug allergies: Atorvastatin, Zolpidem, Codeine, Latex Food allergies: none Alcohol use: none/ rarely  Tobacco use: none  Physical activity: plans to begin chair exercises given by RN, visiting [employer]   Weight history: Childhood: normal    Adolescence: normal     Adulthood: 150lbs until age 25, until ankle injury, then began rapid weight gain.     Weight 1 year ago: 200's  Dieting/ weight loss history: has tried [employer], self-directed change, exercise, all with limited and/or short-term success only. Patient reports reduced mobility with ankle injury and more recently knee and back pain have led to less activity and increased eating.   Dietary Recall:  Daily pattern: 2 meals and 1-2 snacks. Dining out: 2-3 meals per week. Breakfast:10-11am eggs, spam (protein) or bologna Lunch: usually none Supper: 6pm often out -- chicken strips, salad with shrimp; no-bread sub; roast meat + vegetables (greens, brussels sprouts, spinach,etc) sometimes just vegetables.  Snack(s): fig bars, fruit tangerine or apple sometimes with peanut butter or cheese. Sometimes bologna and cheese if waking up during the night Beverages: water, sugar free orange drink, [place]  zero  Psychosocial: Emotional eating history: stress eating often per patient Disordered eating history: none   Intervention:  Patient has researched this procedure by reading, consulting with health professionals.   Instructed her on pre-op diet guidelines, including measures to promote pre-surgery weight loss.   Discussed stages of the bariatric diet after surgery as well as the importance of adequate protein and fluid intake.   Summary:  Patient has made diet and lifestyle changes in effort to lose weight, improve blood sugar control, and prepare for bariatric surgery.  She has solid support from significant other.   She agrees to work on gradual reduction in carbohydrate and fat intake prior to surgery.   She is motivated to follow the bariatric diet after surgery. From a nutrition standpoint, she is ready to proceed with the bariatric surgery program.    Plan:  Patient commits to returning for 5 additional monthly supervised weight loss visits prior to surgery.   She will plan to return for post-op RD visits beginning 2 weeks after surgery.

## 2018-09-18 NOTE — Patient Instructions (Signed)
   Continue working to reduce stress as able; include non-food ways to manage stress and relax such as reading, rug hooking, etc.   Keep portions of starchy foods to 1 cup (fist-size) or less with meals.   Check food labels to make sure foods are low in fat and sugar (less than 10grams per serving)

## 2018-09-20 ENCOUNTER — Telehealth: Payer: Self-pay | Admitting: Family Medicine

## 2018-09-20 MED ORDER — ICOSAPENT ETHYL 1 G PO CAPS: 2.0000 g | ORAL_CAPSULE | Freq: Two times a day (BID) | ORAL | 1 refills | Status: DC

## 2018-09-20 NOTE — Telephone Encounter (Signed)
Patient was approved through the [facility] with the help of Ruben Reason our pharmacist through Chronic Care Management. However prescription needs to go to local pharmacy. Please sign off on prescription for Vascepa to go to [facility] in [place].

## 2018-09-20 NOTE — Telephone Encounter (Signed)
Copied from [place] [id number]. Topic: Quick Communication - See Telephone Encounter >> Sep 20, 2018  9:24 AM Blase Mess A wrote: CRM for notification. See Telephone encounter for: 09/20/18.  Patient is calling because Dr. Ancil Boozer called the patient in a new medication. The pharmacy does not have the medication. The patient does not know the name of the medication. She said that is was going through a grant program. Please advise [phone]

## 2018-10-08 ENCOUNTER — Other Ambulatory Visit: Payer: Self-pay | Admitting: Family Medicine

## 2018-10-08 NOTE — Telephone Encounter (Signed)
Refill request for thyroid medication. Levothyroxine and Lasix  Last visit: 08/27/2018   Lab Results  Component Value Date   TSH 1.65 05/01/2018     Follow up on 10/16/2018

## 2018-10-10 ENCOUNTER — Telehealth: Payer: Self-pay

## 2018-10-11 ENCOUNTER — Ambulatory Visit: Payer: Self-pay

## 2018-10-11 NOTE — Chronic Care Management (AMB) (Signed)
  Chronic Care Management   Follow Up Note   10/11/2018 Name: SANYA KOBRIN MRN: [id number] DOB: [birth year]  Referred by: Steele Sizer, MD Reason for referral : Chronic Care Management (RN CM follow up to HTN/DM/bariatric surgery)   Subjective: "I am doing ok I just have been sick off and own this winter with a cold"   Objective:  Assessment: Ms. BRITANNI YARDE is a 68 year old female primary care patient of Dr. Steele Sizer who was referred to the [facility] program for assistance with chronic disease management, care coordination, and medication management.Ms. Viney was referred to [facility] 05/27/2018 and continues to be followed by the CCM Team.  Goals Addressed    . "I need motivation to get back in the gym" (pt-stated)   Not on track    Ms. Quigg has not been back to the gym since goal was established. She states she has had multiple colds this winter which has prevented her from exercise. She is not an established patient with the bariatric clinic at [facility] and meets with a nutritionist at [facility] monthly. She has already begun to loose weight. She understands exercise would significantly improve her weight loss results and improve her metabolism.   Clinical Goals: Over the next 30 days, patient will verbalize engagement in physical activity as directed by the bariatric team.  Interventions: RN CM reinforced importance of exercise as it related to weight loss and reduction of blood glucose levels and overall improvement of cardiovascular health and wellbeing.  10/11/2018 Provided emotional support and reassurance. Reinforced importance of exercise on reduction of weight, improvement in Cardiovascular health and DM control    . COMPLETED: "my kidney doctor said I need to control my blood pressure better" (pt-stated)       Ms. Lassalle states her BP has been better controlled since she started her BP medication prescribed by her nephrologist. She checks her BP several times a week and reports numbers in  the 160F and 093A systolic. She is trying to eliminate salt in her diet as well.  Clinical Goals: Over the next 14 days, patient will verbalize importance of BP monitoring and hypertension medication compliance as it related to loss of kidney function.  Interventions: Discussed recent appointment with nephrologist. Reviewed newly added hypertension medication and s/s of low BP. Discussed importance of daily monitoring of BP and recording.      Telephone follow up appointment with CCM team member scheduled for: 1 month Patient has a follow up PCP visit 10/16/2018 and is aware    [place]. Rollene Rotunda, RN, BSN Nurse Care Coordinator [facility] Care Management  [phone]

## 2018-10-14 NOTE — Patient Instructions (Signed)
1. Continue to check your BP weekly and record. This will help your PCP and nephrologist better manage your pressures. 2. Please begin to exercise, even if it is walking. You can utilize "big box stores" like [employer], target, [employer], mall, when it is cold outside or the weather is not suitable for outside exercise. 3. Continue to follow your plan of care for weight loss as prescribed by [facility]ariat[facility]. 4. Continue to check your blood sugars and record. 5. Take all medications as prescribed. 6. Contact the CCM Team with any questions or concerns. We will follow up with you in  1 month.  CCM (Chronic Care Management) Team   Trish Fountain RN, BSN Nurse Care Coordinator  [phone]  Ruben Reason PharmD  Clinical Pharmacist  [phone]  Goals Addressed    . "I need motivation to get back in the gym" (pt-stated)   Not on track       Clinical Goals: Over the next 30 days, patient will verbalize engagement in physical activity as directed by the bariatric team.  Interventions: RN CM reinforced importance of exercise as it related to weight loss and reduction of blood glucose levels and overall improvement of cardiovascular health and wellbeing.  10/11/2018 Provided emotional support and reassurance. Reinforced importance of exercise on reduction of weight, improvement in Cardiovascular health and DM control    . COMPLETED: "my kidney doctor said I need to control my blood pressure better" (pt-stated)        Clinical Goals: Over the next 14 days, patient will verbalize importance of BP monitoring and hypertension medication compliance as it related to loss of kidney function.  Interventions: Discussed recent appointment with nephrologist. Reviewed newly added hypertension medication and s/s of low BP. Discussed importance of daily monitoring of BP and recording.    The patient verbalized understanding of instructions provided today and declined a print copy of patient  instruction materials.

## 2018-10-16 ENCOUNTER — Ambulatory Visit (INDEPENDENT_AMBULATORY_CARE_PROVIDER_SITE_OTHER): Payer: Medicare HMO | Admitting: Family Medicine

## 2018-10-16 ENCOUNTER — Encounter: Payer: Self-pay | Admitting: Family Medicine

## 2018-10-16 DIAGNOSIS — M170 Bilateral primary osteoarthritis of knee: Secondary | ICD-10-CM | POA: Diagnosis not present

## 2018-10-16 DIAGNOSIS — M25561 Pain in right knee: Secondary | ICD-10-CM | POA: Diagnosis not present

## 2018-10-16 DIAGNOSIS — M25562 Pain in left knee: Secondary | ICD-10-CM | POA: Diagnosis not present

## 2018-10-16 DIAGNOSIS — F331 Major depressive disorder, recurrent, moderate: Secondary | ICD-10-CM

## 2018-10-16 MED ORDER — ACETAMINOPHEN ER 650 MG PO TBCR: 650.0000 mg | EXTENDED_RELEASE_TABLET | Freq: Three times a day (TID) | ORAL | 2 refills | Status: DC | PRN

## 2018-10-16 NOTE — Progress Notes (Addendum)
Name: Summer [name]   MRN: [id number]    DOB: [birth year]   Date:10/16/2018       Progress Note  Subjective  Chief Complaint  Chief Complaint  Patient presents with  . Follow-up    1 month F/U  . Obesity    Has been drinking protein shakes and 64 oz of water-getting ready for bariatric surgery-around June 2020    HPI  Morbid obesity: high bmi and co-morbidities. She has DM, OA, GERD, HTN, she is still  going to [facility] center and plans on having surgery in 2020. She has tried multiple diets in the past, difficulty exercising because of SOB and joint pains. She joined [employer] June 2019, she was going a few days a week but since Christmas she has not been back secondary to an URI that did not clear for weeks. She is now seeing dietician monthly. She is doing better with food choices, still gets up in the middle of the night with pain and snacks however now she is snacking more on vegetables or jello. She is also drinking more water. She lost 3 lbs since last visit   OA knee: she states pain has been worse with the damp weather, she has been taking Tylenol prn only, advised to take it TID to control pain, max is 3 grams, explained that she may need to have surgery on her knees, but losing weight may also control the pain , should wait to have surgery after bariatric surgery   MDD: very depressed, she will be seen by a therapist through the bariatric clinic soon   Patient Active Problem List   Diagnosis Date Noted  . (HFpEF) heart failure with preserved ejection fraction ([place]) 08/31/2017  . Moderate persistent asthma 08/31/2017  . Hyperlipidemia due to type 2 diabetes mellitus ([place]) 06/14/2017  . Essential hypertension 10/06/2016  . Morbid obesity with BMI of 40.0-44.9, adult ([place]) 10/06/2016  . Dyslipidemia associated with type 2 diabetes mellitus ([place]) 08/18/2016  . Charcot foot due to diabetes mellitus ([place]) 02/22/2016  . Hypertriglyceridemia 07/27/2015  .  Acquired abduction deformity of foot 07/12/2015  . Osteoarthritis of subtalar joint 07/12/2015  . Poorly controlled type 2 diabetes mellitus with neuropathy ([place]) 07/12/2015  . Arthritis of foot, degenerative 07/12/2015  . Carpal tunnel syndrome 04/17/2015  . Chronic constipation 04/17/2015  . Insomnia, persistent 04/17/2015  . Chronic kidney disease (CKD), stage III (moderate) ([place]) 04/17/2015  . Decreased exercise tolerance 04/17/2015  . Diabetes mellitus with polyneuropathy ([place]) 04/17/2015  . Gastro-esophageal reflux disease without esophagitis 04/17/2015  . Bursitis, trochanteric 04/17/2015  . Cephalalgia 04/17/2015  . Benign hypertension 04/17/2015  . Adult hypothyroidism 04/17/2015  . Hearing loss 04/17/2015  . Chronic recurrent major depressive disorder ([place]) 04/17/2015  . Neurogenic claudication 04/17/2015  . Obesity (BMI 35.0-39.9 without comorbidity) 04/17/2015  . Hypo-ovarianism 04/17/2015  . Perennial allergic rhinitis with seasonal variation 04/17/2015  . Acne erythematosa 04/17/2015  . Dyskinesia, tardive 04/17/2015  . Memory loss 04/17/2015  . Impingement syndrome of shoulder 04/17/2015  . Dermatitis, stasis 04/17/2015  . Obstructive sleep apnea 05/14/2014  . Dyspnea 05/06/2014  . Hyperlipidemia 02/06/2012  . LBP (low back pain) 09/16/2008    Past Surgical History:  Procedure Laterality Date  . ANKLE SURGERY Left approx Jan 2018  . CATARACT EXTRACTION  01/2011   right  . eye lid surgery  2013   bilateral  . FOOT SURGERY    . NECK SURGERY    . TUBAL LIGATION    .  VAGINAL HYSTERECTOMY  1989    Family History  Problem Relation Age of Onset  . Heart attack Mother   . Aneurysm Mother   . Heart attack Maternal Grandfather   . Diabetes Maternal Grandfather     Social History   Socioeconomic History  . Marital status: Significant Other    Spouse name: Not on file  . Number of children: 1  . Years of education: Not on file  . Highest education level:  High school graduate  Occupational History  . Occupation: retired  Scientific laboratory technician  . Financial resource strain: Not hard at all  . Food insecurity:    Worry: Never true    Inability: Never true  . Transportation needs:    Medical: No    Non-medical: No  Tobacco Use  . Smoking status: Never Smoker  . Smokeless tobacco: Never Used  Substance and Sexual Activity  . Alcohol use: No    Alcohol/week: 0.0 - 1.0 standard drinks    Frequency: Never  . Drug use: No  . Sexual activity: Yes    Partners: Male  Lifestyle  . Physical activity:    Days per week: 0 days    Minutes per session: 0 min  . Stress: Rather much  Relationships  . Social connections:    Talks on phone: More than three times a week    Gets together: Once a week    Attends religious service: Never    Active member of club or organization: No    Attends meetings of clubs or organizations: Never    Relationship status: Living with partner  . Intimate partner violence:    Fear of current or ex partner: No    Emotionally abused: No    Physically abused: No    Forced sexual activity: No  Other Topics Concern  . Not on file  Social History Narrative   Son lives in [place] (retired from [employer]) and her granddaughter (82yr old) [place].     Current Outpatient Medications:  .  ACCU-CHEK SOFTCLIX LANCETS lancets, , Disp: , Rfl:  .  acetaminophen (TYLENOL 8 HOUR) 650 MG CR tablet, Take 1 tablet (650 mg total) by mouth every 8 (eight) hours as needed for pain., Disp: 90 tablet, Rfl: 2 .  albuterol (PROVENTIL HFA) 108 (90 Base) MCG/ACT inhaler, Inhale 2 puffs into the lungs 2 (two) times daily as needed for shortness of breath. , Disp: , Rfl:  .  Alcohol Swabs (B-D SINGLE USE SWABS REGULAR) PADS, , Disp: , Rfl:  .  amLODipine (NORVASC) 2.5 MG tablet, Take 2.5 mg by mouth daily. , Disp: , Rfl: 3 .  ASPIRIN LOW DOSE 81 MG EC tablet, TAKE 1 TABLET (81 MG TOTAL) BY MOUTH DAILY., Disp: 30 tablet,  Rfl: 0 .  Blood Glucose Monitoring Suppl (ACCU-CHEK AVIVA PLUS) w/Device KIT, , Disp: , Rfl:  .  DULoxetine (CYMBALTA) 60 MG capsule, Take 1 capsule (60 mg total) by mouth daily., Disp: 90 capsule, Rfl: 1 .  furosemide (LASIX) 20 MG tablet, Take 1 tablet (20 mg total) by mouth daily., Disp: 90 tablet, Rfl: 0 .  Icosapent Ethyl (VASCEPA) 1 g CAPS, Take 2 capsules (2 g total) by mouth 2 (two) times daily., Disp: 360 capsule, Rfl: 1 .  insulin NPH-regular Human (70-30) 100 UNIT/ML injection, Inject 90 Units into the skin 2 (two) times daily with a meal., Disp: , Rfl:  .  irbesartan-hydrochlorothiazide (AVALIDE) 300-12.5 MG tablet, Take 1  tablet by mouth daily., Disp: 90 tablet, Rfl: 1 .  levothyroxine (SYNTHROID, LEVOTHROID) 25 MCG tablet, TAKE 1 TABLET EVERY DAY EXCEPT TAKE 2 TABLETS ON SUNDAYS, Disp: 100 tablet, Rfl: 0 .  metFORMIN (GLUCOPHAGE-XR) 500 MG 24 hr tablet, Take 500 mg by mouth. 2 tablets daily (total of 1000 mg daily), Disp: , Rfl:  .  metoprolol tartrate (LOPRESSOR) 25 MG tablet, Take 1 tablet (25 mg total) by mouth 2 (two) times daily., Disp: 180 tablet, Rfl: 1 .  Misc Natural Products (OSTEO BI-FLEX ADV JOINT SHIELD PO), Take by mouth 2 (two) times a week., Disp: , Rfl:  .  montelukast (SINGULAIR) 10 MG tablet, Take 1 tablet (10 mg total) by mouth at bedtime., Disp: 90 tablet, Rfl: 1 .  Omega-3 Fatty Acids (FISH OIL) 1000 MG CAPS, Take by mouth., Disp: , Rfl:  .  omeprazole (PRILOSEC) 40 MG capsule, Take 1 capsule (40 mg total) by mouth daily., Disp: 90 capsule, Rfl: 0 .  Potassium 99 MG TABS, Take by mouth., Disp: , Rfl:  .  pregabalin (LYRICA) 300 MG capsule, Take 2 capsules (600 mg total) by mouth daily. (Patient taking differently: Take 600 mg by mouth daily. ), Disp: 180 capsule, Rfl: 1 .  Probiotic Product (PROBIOTIC-10 PO), Take by mouth., Disp: , Rfl:  .  RELION INSULIN SYRINGE 1ML/31G 31G X 5/16" 1 ML MISC, , Disp: , Rfl:  .  rosuvastatin (CRESTOR) 20 MG tablet, Take 1 tablet  (20 mg total) by mouth daily., Disp: 90 tablet, Rfl: 1 .  vitamin C (ASCORBIC ACID) 500 MG tablet, Take 500 mg by mouth 2 (two) times daily. , Disp: , Rfl:  .  VITAMIN D, CHOLECALCIFEROL, PO, Take 1,000 Units by mouth daily. , Disp: , Rfl:   Allergies  Allergen Reactions  . Codeine Other (See Comments)    "TRIPPED OUT"  DIDN'T LIKE THE [place]  . Atorvastatin     muscle pain  . Latex Rash  . Zolpidem Other (See Comments)    Sleep walk    I personally reviewed active problem list, medication list, allergies, family history with the patient/caregiver today.   ROS  Constitutional: Negative for fever or weight change.  Respiratory: Negative for cough and shortness of breath.   Cardiovascular: Negative for chest pain or palpitations.  Gastrointestinal: Negative for abdominal pain, no bowel changes.  Musculoskeletal: Negative for gait problem or joint swelling.  Skin: Negative for rash.  Neurological: Negative for dizziness or headache.  No other specific complaints in a complete review of systems (except as listed in HPI above).  Objective  Vitals:   10/16/18 1346  BP: 134/68  Pulse: 96  Resp: 16  Temp: 97.7 F (36.5 C)  TempSrc: Oral  SpO2: 97%  Weight: 252 lb 14.4 oz (114.7 kg)  Height: '5\' 2"'$  (1.575 m)    Body mass index is 46.26 kg/m.  Physical Exam  Constitutional: Patient appears well-developed and well-nourished. Obese  No distress.  HEENT: head atraumatic, normocephalic, pupils equal and reactive to light,  neck supple, throat within normal limits Cardiovascular: Normal rate, regular rhythm and normal heart sounds.  No murmur heard. Trace BLE edema. Pulmonary/Chest: Effort normal and breath sounds normal. No respiratory distress. Abdominal: Soft.  There is no tenderness. Muscular skeletal: pain during  Extension of both knees, crepitus with extension of right knee  Psychiatric: Patient has a normal mood and affect. behavior is normal. Judgment and thought  content normal.  Recent Results (from the past  2160 hour(s))  Influenza panel by PCR (type A & B)     Status: None   Collection Time: 08/11/18  1:57 PM  Result Value Ref Range   Influenza A By PCR NEGATIVE NEGATIVE   Influenza B By PCR NEGATIVE NEGATIVE    Comment: (NOTE) The Xpert Xpress Flu assay is intended as an aid in the diagnosis of  influenza and should not be used as a sole basis for treatment.  This  assay is FDA approved for nasopharyngeal swab specimens only. Nasal  washings and aspirates are unacceptable for Xpert Xpress Flu testing. Performed at [facility], [address] [place], [place] [address]   POCT HgB A1C     Status: Abnormal   Collection Time: 08/27/18  2:56 PM  Result Value Ref Range   Hemoglobin A1C 8.1 (A) 4.0 - 5.6 %   HbA1c POC (<> result, manual entry) 8.1 4.0 - 5.6 %   HbA1c, POC (prediabetic range) 8.1 (A) 5.7 - 6.4 %   HbA1c, POC (controlled diabetic range) 8.1 (A) 0.0 - 7.0 %     PHQ2/9: Depression screen [facility] 2/9 10/16/2018 09/18/2018 08/27/2018 07/26/2018 07/24/2018  Decreased Interest _0 Down, Depressed, Hopeless _1 PHQ - 2 Score _2 Altered sleeping 3 - 3 0 0  Tired, decreased energy 3 - _3 Change in appetite 3 - _4 Feeling bad or failure about yourself  2 - _5 Trouble concentrating 3 - _6 Moving slowly or fidgety/restless 2 - _7 Suicidal thoughts 3 - 3 0 0  PHQ-9 Score 25 - _8 Difficult doing work/chores Very difficult - Extremely dIfficult - Not difficult at all  Some recent data might be hidden     Fall Risk: Fall Risk  10/16/2018 09/18/2018 07/26/2018 07/24/2018 06/11/2018  Falls in the past year? 0 0 0 0 No  Comment - - - - -  Number falls in past yr: - - 0 - -  Injury with Fall? - - - - -  Risk Factor Category  - - - - -  Comment - - - - -  Risk for fall due to : - - - - Impaired balance/gait;Impaired mobility  Risk for fall due to: Comment - - - - -  Follow up - - - -  -     Functional Status Survey: Is the patient deaf or have difficulty hearing?: No Does the patient have difficulty seeing, even when wearing glasses/contacts?: Yes(glasses) Does the patient have difficulty concentrating, remembering, or making decisions?: No Does the patient have difficulty walking or climbing stairs?: Yes Does the patient have difficulty dressing or bathing?: No Does the patient have difficulty doing errands alone such as visiting a doctor's office or shopping?: No    Assessment & Plan  1. Morbid (severe) obesity due to excess calories ([place])   2. Primary osteoarthritis of both knees  - acetaminophen (TYLENOL 8 HOUR) 650 MG CR tablet; Take 1 tablet (650 mg total) by mouth every 8 (eight) hours as needed for pain.  Dispense: 90 tablet; Refill: 2  4. MDD (major depressive disorder), recurrent episode, moderate ([place])  Depression is getting worse , discussed importance of follow up with psychiatrist , she states she will be seeing someone soon for the bariatric clinic

## 2018-10-23 ENCOUNTER — Encounter: Payer: Self-pay | Admitting: Dietician

## 2018-10-23 ENCOUNTER — Encounter: Payer: Medicare HMO | Attending: Surgery | Admitting: Dietician

## 2018-10-23 VITALS — Ht 62.0 in | Wt 251.9 lb

## 2018-10-23 DIAGNOSIS — E66813 Obesity, class 3: Secondary | ICD-10-CM

## 2018-10-23 DIAGNOSIS — E119 Type 2 diabetes mellitus without complications: Secondary | ICD-10-CM | POA: Diagnosis not present

## 2018-10-23 DIAGNOSIS — Z6841 Body Mass Index (BMI) 40.0 and over, adult: Secondary | ICD-10-CM | POA: Insufficient documentation

## 2018-10-23 DIAGNOSIS — Z713 Dietary counseling and surveillance: Secondary | ICD-10-CM | POA: Diagnosis not present

## 2018-10-23 DIAGNOSIS — Z794 Long term (current) use of insulin: Secondary | ICD-10-CM

## 2018-10-23 DIAGNOSIS — E1140 Type 2 diabetes mellitus with diabetic neuropathy, unspecified: Secondary | ICD-10-CM

## 2018-10-23 DIAGNOSIS — E785 Hyperlipidemia, unspecified: Secondary | ICD-10-CM | POA: Diagnosis not present

## 2018-10-23 NOTE — Progress Notes (Signed)
Appt start time: 1315 end time:  1445  Assessment:   #1 SWL Appointment.   Start Wt at NDES: 254.9lbs Wt: 251.9lbs Ht: 5'2" BMI: 46.07  Preferred Learning Style:   Auditory  Hands on    Learning Readiness:   Change in progress  MEDICATIONS: acetaminophen, albuterol inhaler, amLODipine, aspirin, DULoxetine, furosemide, icosapent ethyl, NPH 70-30 insulin, irbesartan-hydrochlorothiazide, levothyroxine, metFORMIN, metorprolol tartrate, Osteo bi-flex, montelukast, Omega-3 fish oil, Potassium, pregabalin, probiotic, rosuvastatin, vitamin C, vitamin D  Progress: Weight loss of 3lbs in the past month. Patient has worked to reduce carbohydrate intake. She would like to further reduce evening snacking, but reports increased appetite, and occasionally wakes up hungry during the night. She reports difficulty in trying to increase exercise.   DIETARY INTAKE: 24-hr recall:  Breakfast: 2 boiled eggs, + banana + Premier protein shake  Snack: none  Lunch: often none; occasional protein shake, or sugar free jello or Greek yogurt Snack: none Dinner: 2/11 microwaveable lasagna; often salad from restaurant ie [employer]  Snack: 2/11 banana and yogurt Beverages: water, sugar free flavored water, Sierra Mist zero  Usual physical activity: some light chair exercise 10 minutes, once daily  Diet to Follow: Continue with current eating pattern              Nutritional Diagnosis:  Wilder-3.3 Overweight/obesity related to history of excess calories and physical inactivity as evidenced by patient current BMI of 46.07 following dietary guidelines for continued weight loss prior to bariatric surgery.              Intervention:   . Nutrition counseling for upcoming bariatric surgery. . Discussed importance of adequate intake throughout the day to prevent nighttime hunger.  . Instructed on food label reading for sugar and carbohydrate content.  . Patient will continue to work on pre-op goals.   Teaching Method  Utilized:  Visual Auditory Hands on  Handouts given during visit include:  Goals and instructions   Barriers to learning/adherence to lifestyle change: none  Demonstrated degree of understanding via:  Teach Back   Monitoring/Evaluation:  Dietary intake, exercise, and body weight 11/20/18.

## 2018-10-23 NOTE — Patient Instructions (Signed)
   Continue with current eating pattern, great job controlling carbs.   Gradually check into other options for protein shakes/ supplements for use after surgery.   Keep up chair exercise as often as possible, try adding another session each day.   Add some non-food related activities after dinner to minimize late night snacking.

## 2018-10-24 ENCOUNTER — Encounter: Payer: Self-pay | Admitting: Podiatry

## 2018-10-24 ENCOUNTER — Ambulatory Visit: Payer: Medicare HMO | Admitting: Podiatry

## 2018-10-24 DIAGNOSIS — E1142 Type 2 diabetes mellitus with diabetic polyneuropathy: Secondary | ICD-10-CM | POA: Diagnosis not present

## 2018-10-24 DIAGNOSIS — M205X1 Other deformities of toe(s) (acquired), right foot: Secondary | ICD-10-CM

## 2018-10-24 DIAGNOSIS — S91114A Laceration without foreign body of right lesser toe(s) without damage to nail, initial encounter: Secondary | ICD-10-CM | POA: Diagnosis not present

## 2018-10-24 DIAGNOSIS — E11610 Type 2 diabetes mellitus with diabetic neuropathic arthropathy: Secondary | ICD-10-CM | POA: Diagnosis not present

## 2018-10-24 MED ORDER — CEPHALEXIN 500 MG PO CAPS: 500.0000 mg | ORAL_CAPSULE | Freq: Two times a day (BID) | ORAL | 0 refills | Status: DC

## 2018-10-24 NOTE — Progress Notes (Signed)
This patient presents the office with chief complaint of a cut that she sustained trimming her nails 6 weeks ago.  She said that she was trimming her nails and her hand slipped.  She says that she thought this incision would have healed by now.  She says that there is no pain or discomfort but this is due to her neuropathy.  She says that she occasionally sees blood on the floor when she walks.  This tells her that the skin lesion has still not healed.  She presents the office today for an evaluation and treatment of this skin laceration second toe right foot.  General Appearance  Alert, conversant and in no acute stress.  Vascular  Dorsalis pedis and posterior tibial  pulses are palpable  bilaterally.  Capillary return is within normal limits  bilaterally. Temperature is within normal limits  bilaterally.  Neurologic  Senn-Weinstein monofilament wire test absent  bilaterally. Muscle power within normal limits bilaterally.  Nails Thick disfigured discolored nails with subungual debris  from hallux to fifth toes bilaterally. No evidence of bacterial infection or drainage bilaterally.  Orthopedic  No limitations of motion  feet .  No crepitus or effusions noted.  No bony pathology or digital deformities noted. Charcot foot noted.  Skin  normotropic skin with no porokeratosis noted bilaterally.  No signs of infections or ulcers noted.  There is a skin laceration at the distal aspect of the second toe right foot.  the laceration  appears callused and there is a gapping between the two edges.  No drainage noted.   There is redness noted at proximal nail fold.  Laceration second toe right foot  Onychomycosis  ROV.  Evaluated and treated the laceration second toe right foot.  This patient is diabetic with absent L OPS.  The skin edges were debrided removing the callus at the site of the laceration.  The incision was then bandaged with Silvadene dry sterile dressing and padding.  Patient was given home  instructions for soaks.  Patient was prescribed cephalexin to prevent infection.  Debridement and grinding of long thick painful nails x10.  Patient was told to return to the office in 10 days for further evaluation and treatment.  Gardiner Barefoot DPM

## 2018-10-24 NOTE — Patient Instructions (Signed)

## 2018-10-30 ENCOUNTER — Ambulatory Visit: Payer: Medicare HMO | Admitting: Orthotics

## 2018-10-30 DIAGNOSIS — E11610 Type 2 diabetes mellitus with diabetic neuropathic arthropathy: Secondary | ICD-10-CM

## 2018-10-30 DIAGNOSIS — M205X1 Other deformities of toe(s) (acquired), right foot: Secondary | ICD-10-CM

## 2018-10-30 DIAGNOSIS — E1142 Type 2 diabetes mellitus with diabetic polyneuropathy: Secondary | ICD-10-CM

## 2018-10-30 DIAGNOSIS — M21961 Unspecified acquired deformity of right lower leg: Secondary | ICD-10-CM

## 2018-10-30 DIAGNOSIS — S91114A Laceration without foreign body of right lesser toe(s) without damage to nail, initial encounter: Secondary | ICD-10-CM

## 2018-10-30 DIAGNOSIS — M2032 Hallux varus (acquired), left foot: Secondary | ICD-10-CM

## 2018-10-30 DIAGNOSIS — M19072 Primary osteoarthritis, left ankle and foot: Secondary | ICD-10-CM

## 2018-10-30 NOTE — Progress Notes (Signed)
Patient presents today for diabetic shoe measurement and foam casting.  Goals of diabetic shoes/inserts to offer protection from conditions secondary to DM2, offer relief from sheer forces that could lead to ulcerations, protect the foot, and offer greater stability. Patient is under supervision of DPM Physician managing patients DM2: Patient has following documented conditions to qualify for diabetic shoes/inserts: Patient measured with brannock device: 

## 2018-10-31 ENCOUNTER — Encounter: Payer: Self-pay | Admitting: Internal Medicine

## 2018-10-31 ENCOUNTER — Ambulatory Visit: Payer: Medicare HMO | Admitting: Podiatry

## 2018-10-31 ENCOUNTER — Ambulatory Visit: Payer: Medicare HMO | Admitting: Internal Medicine

## 2018-10-31 VITALS — BP 136/62 | HR 91 | Resp 16 | Ht 62.0 in | Wt 250.0 lb

## 2018-10-31 DIAGNOSIS — R0602 Shortness of breath: Secondary | ICD-10-CM

## 2018-10-31 DIAGNOSIS — R0609 Other forms of dyspnea: Secondary | ICD-10-CM

## 2018-10-31 DIAGNOSIS — J4540 Moderate persistent asthma, uncomplicated: Secondary | ICD-10-CM | POA: Diagnosis not present

## 2018-10-31 DIAGNOSIS — G4733 Obstructive sleep apnea (adult) (pediatric): Secondary | ICD-10-CM

## 2018-10-31 NOTE — Progress Notes (Signed)
Faxed today's office visit and pulmonary clearance letter over to Dr. Nicholes Stairs office, per patient's request.

## 2018-10-31 NOTE — Patient Instructions (Signed)
Continue albuterol as needed 

## 2018-10-31 NOTE — Progress Notes (Signed)
[facility] Consultation      Assessment and Plan:  Asthma. --Continue dyspnea on exertion currently on albuterol.  --She has been on dulera but it did not help, therefore it was thought that her asthma is not the main driver of her dyspnea.   Preoperative pulmonary examination. - Discussed risks with upcoming bariatric surgery.  His risk include bleeding, infection, lung collapse, pneumonia, need for prolonged mechanical ventilatory support.  These were discussed today. - Patient is cleared for surgery from respiratory standpoint.  Dyspnea on exertion with restrictive lung disease. --I suspect that her dyspnea is multifactorial from asthma, obesity, deconditioning, volume overload.  --Recommend that she try to increase her activity as tolerated.   GERD. --Symptomatic, and may be contributing to dyspnea. --Continue omeprazole, discussed anti-reflux measures.   Volume overload. 2+ bilateral lower extremity edema. -Continue Lasix.  Obstructive sleep apnea. --Intolerant of cpap mask.  --She is no longer using cpap, recommend that she restart.   Morbid obesity. --Weight loss recommended. She is currenlty being evaluated for bariatric surgery.    Date: 10/31/2018  MRN# [id number] Summer [name] 1951-04-12   Summer [name] is a 68 y.o. old female seen in consultation for chief complaint of:    Chief Complaint  Patient presents with  . surgical clearance    pt here for clearance for gastric bypass.  . Shortness of Breath    with exertion only; she denies wheeze, chest tightness.    HPI:  The patient is a 68 yo female with persistent dyspnea. At last visit she had poorly controlled asthma, symptomatic gerd, volume overload with peripheral edema and OSA intolerant of CPAP.  Today she feels that her breathing continues to be ok at rest but continues to have dyspnea with mild activity such as walking a short distance.  She is on albuterol a few times per week, not  sure how much it does. She stopped dulera as it was not really helping.  She remains on PPI. She remains off of cpap.  She had ankle surgery about a year ago and did not have respiratory complications.    CXR 07/16/17>>Bibasilar atelectasis.   **Spirometry 10/31/2018>> tracings personally reviewed.  FVC is 64% predicted, FEV1 is 60% predicted.  Ratio is 70%.  Expiratory time is 6.1 seconds.  Overall this test is consistent with moderate obstructive lung disease. **Echo 07/04/17; XI=33%>> RV systolic function reported as "normal" **08/18/16 CBC>> Eos=438 **05/12/14; Echo>> EF=55%; Pulm systolic pressure was normal.   Medication:    Current Outpatient Medications:  .  ACCU-CHEK SOFTCLIX LANCETS lancets, , Disp: , Rfl:  .  acetaminophen (TYLENOL 8 HOUR) 650 MG CR tablet, Take 1 tablet (650 mg total) by mouth every 8 (eight) hours as needed for pain., Disp: 90 tablet, Rfl: 2 .  albuterol (PROVENTIL HFA) 108 (90 Base) MCG/ACT inhaler, Inhale 2 puffs into the lungs 2 (two) times daily as needed for shortness of breath. , Disp: , Rfl:  .  Alcohol Swabs (B-D SINGLE USE SWABS REGULAR) PADS, , Disp: , Rfl:  .  amLODipine (NORVASC) 2.5 MG tablet, Take 2.5 mg by mouth daily. , Disp: , Rfl: 3 .  ASPIRIN LOW DOSE 81 MG EC tablet, TAKE 1 TABLET (81 MG TOTAL) BY MOUTH DAILY., Disp: 30 tablet, Rfl: 0 .  Blood Glucose Monitoring Suppl (ACCU-CHEK AVIVA PLUS) w/Device KIT, , Disp: , Rfl:  .  cephALEXin (KEFLEX) 500 MG capsule, Take 1 capsule (500 mg total) by mouth 2 (two) times daily.,  Disp: 15 capsule, Rfl: 0 .  DULoxetine (CYMBALTA) 60 MG capsule, Take 1 capsule (60 mg total) by mouth daily., Disp: 90 capsule, Rfl: 1 .  furosemide (LASIX) 20 MG tablet, Take 1 tablet (20 mg total) by mouth daily., Disp: 90 tablet, Rfl: 0 .  Icosapent Ethyl (VASCEPA) 1 g CAPS, Take 2 capsules (2 g total) by mouth 2 (two) times daily., Disp: 360 capsule, Rfl: 1 .  insulin NPH-regular Human (70-30) 100 UNIT/ML injection, Inject 90  Units into the skin 2 (two) times daily with a meal., Disp: , Rfl:  .  irbesartan-hydrochlorothiazide (AVALIDE) 300-12.5 MG tablet, Take 1 tablet by mouth daily., Disp: 90 tablet, Rfl: 1 .  levothyroxine (SYNTHROID, LEVOTHROID) 25 MCG tablet, TAKE 1 TABLET EVERY DAY EXCEPT TAKE 2 TABLETS ON SUNDAYS, Disp: 100 tablet, Rfl: 0 .  metFORMIN (GLUCOPHAGE-XR) 500 MG 24 hr tablet, Take 500 mg by mouth. 2 tablets daily (total of 1000 mg daily), Disp: , Rfl:  .  metoprolol tartrate (LOPRESSOR) 25 MG tablet, Take 1 tablet (25 mg total) by mouth 2 (two) times daily., Disp: 180 tablet, Rfl: 1 .  Misc Natural Products (OSTEO BI-FLEX ADV JOINT SHIELD PO), Take by mouth 2 (two) times a week., Disp: , Rfl:  .  montelukast (SINGULAIR) 10 MG tablet, Take 1 tablet (10 mg total) by mouth at bedtime., Disp: 90 tablet, Rfl: 1 .  Omega-3 Fatty Acids (FISH OIL) 1000 MG CAPS, Take by mouth., Disp: , Rfl:  .  omeprazole (PRILOSEC) 40 MG capsule, Take 1 capsule (40 mg total) by mouth daily., Disp: 90 capsule, Rfl: 0 .  Potassium 99 MG TABS, Take by mouth., Disp: , Rfl:  .  pregabalin (LYRICA) 300 MG capsule, Take 2 capsules (600 mg total) by mouth daily. (Patient taking differently: Take 600 mg by mouth daily. ), Disp: 180 capsule, Rfl: 1 .  Probiotic Product (PROBIOTIC-10 PO), Take by mouth., Disp: , Rfl:  .  RELION INSULIN SYRINGE 1ML/31G 31G X 5/16" 1 ML MISC, , Disp: , Rfl:  .  rosuvastatin (CRESTOR) 20 MG tablet, Take 1 tablet (20 mg total) by mouth daily., Disp: 90 tablet, Rfl: 1 .  vitamin C (ASCORBIC ACID) 500 MG tablet, Take 500 mg by mouth 2 (two) times daily. , Disp: , Rfl:  .  VITAMIN D, CHOLECALCIFEROL, PO, Take 1,000 Units by mouth daily. , Disp: , Rfl:    Allergies:  Codeine; Atorvastatin; Latex; and Zolpidem  Review of Systems:  Constitutional: Feels well. Cardiovascular: Denies chest pain, exertional chest pain.  Pulmonary: Denies hemoptysis, pleuritic chest pain.   The remainder of systems were  reviewed and were found to be negative other than what is documented in the HPI.    Physical Examination:   VS: BP 136/62 (BP Location: Left Arm, Cuff Size: Large)   Pulse 91   Resp 16   Ht _0  (1.575 m)   Wt 250 lb (113.4 kg)   SpO2 99%   BMI 45.73 kg/m   General Appearance: No distress  Neuro:without focal findings, mental status, speech normal, alert and oriented HEENT: PERRLA, EOM intact Pulmonary: No wheezing, No rales  CardiovascularNormal S1,S2.  No m/r/g.  Abdomen: Benign, Soft, non-tender, No masses Renal:  No costovertebral tenderness  GU:  No performed at this time. Endoc: No evident thyromegaly, no signs of acromegaly or Cushing features Skin:   warm, no rashes, no ecchymosis  Extremities: normal, no cyanosis, clubbing.      LABORATORY PANEL:   CBC No  results for input(s): WBC, HGB, HCT, PLT in the last 168 hours. ------------------------------------------------------------------------------------------------------------------  Chemistries  No results for input(s): NA, K, CL, CO2, GLUCOSE, BUN, CREATININE, CALCIUM, MG, AST, ALT, ALKPHOS, BILITOT in the last 168 hours.  Invalid input(s): GFRCGP ------------------------------------------------------------------------------------------------------------------  Cardiac Enzymes No results for input(s): TROPONINI in the last 168 hours. ------------------------------------------------------------  RADIOLOGY:  No results found.     Thank  you for the consultation and for allowing [facility] to assist in the care of your patient. Our recommendations are noted above.  Please contact us if we can be of further service.   Marda Stalker, M.D., F.C.C.P.  Board Certified in Internal Medicine, Pulmonary Medicine, [place], and Sleep Medicine.  [facility] Office Number: [phone]  10/31/2018

## 2018-11-04 ENCOUNTER — Ambulatory Visit (INDEPENDENT_AMBULATORY_CARE_PROVIDER_SITE_OTHER): Payer: Medicare HMO | Admitting: Podiatry

## 2018-11-04 ENCOUNTER — Ambulatory Visit: Payer: Medicare HMO | Admitting: Podiatry

## 2018-11-04 ENCOUNTER — Encounter: Payer: Self-pay | Admitting: Podiatry

## 2018-11-04 DIAGNOSIS — S91114A Laceration without foreign body of right lesser toe(s) without damage to nail, initial encounter: Secondary | ICD-10-CM

## 2018-11-04 DIAGNOSIS — E1142 Type 2 diabetes mellitus with diabetic polyneuropathy: Secondary | ICD-10-CM

## 2018-11-04 DIAGNOSIS — M205X1 Other deformities of toe(s) (acquired), right foot: Secondary | ICD-10-CM

## 2018-11-04 NOTE — Progress Notes (Signed)
This patient presents the office with chief complaint of a cut that she sustained trimming her nails 7-8 weeks ago.  She states that the toe has still not completely healed.  She says there is still black tissue at the site of the injury.  She says that the cut was very deep when sustained.  She says she was wearing a bandage but the bandage was hard to keep on due to the site of the laceration.  She presents the office today for continued evaluation and treatment of this healing laceration.  General Appearance  Alert, conversant and in no acute stress.  Vascular  Dorsalis pedis and posterior tibial  pulses are palpable  bilaterally.  Capillary return is within normal limits  bilaterally. Temperature is within normal limits  bilaterally.  Neurologic  Senn-Weinstein monofilament wire test absent  bilaterally. Muscle power within normal limits bilaterally.  Nails Thick disfigured discolored nails with subungual debris  from hallux to fifth toes bilaterally. No evidence of bacterial infection or drainage bilaterally.  Orthopedic  No limitations of motion  feet .  No crepitus or effusions noted.  No bony pathology or digital deformities noted. Charcot foot noted.  Mallet toe  B/l.  Skin  normotropic skin with no porokeratosis noted bilaterally.  No signs of infections or ulcers noted.  There is a skin laceration at the distal aspect of the second toe right foot.  the laceration  appears callused and there is a gapping between the two edges.  No drainage noted.   There is blackened necrotic tissue at the proximal plantar skin at the laceration site.  Laceration second toe right foot  Onychomycosis  ROV.  Evaluated and treated the laceration second toe right foot.  This patient is diabetic with absent L OPS.  The necrotic tissue was debrided at laceration site..  The incision was then bandaged with steri-strips.   Patient was told to return to the office prn  for further evaluation and treatment. She was  also told to make an appointment for preventative foot care services to prevent further self inflicted injury.  Gardiner Barefoot DPM

## 2018-11-05 ENCOUNTER — Ambulatory Visit (INDEPENDENT_AMBULATORY_CARE_PROVIDER_SITE_OTHER): Payer: Medicare HMO

## 2018-11-05 DIAGNOSIS — E1143 Type 2 diabetes mellitus with diabetic autonomic (poly)neuropathy: Secondary | ICD-10-CM | POA: Diagnosis not present

## 2018-11-05 DIAGNOSIS — Z794 Long term (current) use of insulin: Secondary | ICD-10-CM

## 2018-11-05 DIAGNOSIS — I10 Essential (primary) hypertension: Secondary | ICD-10-CM | POA: Diagnosis not present

## 2018-11-05 NOTE — Patient Instructions (Signed)
  Thank you allowing the Chronic Care Management Team to be a part of your care! It was a pleasure speaking with you today!  1. Continue to follow your nutrition treatment plan for weight loss. You are making great progress!! 2. Continue to take all medications as prescribed and follow thru with all scheduled medical appointments 3. Continue to check your blood sugars as discussed. I am happy you are seeing positive results/lower numbers from your healthy eating. 4. Try to walk as much as possible. Exercise helps with physical and mental wellness  CCM (Chronic Care Management) Team   Trish Fountain RN, BSN Nurse Care Coordinator  [phone]  Ruben Reason PharmD  Clinical Pharmacist  [phone]   Goals Addressed            This Visit's Progress   . COMPLETED: "I need motivation to get back in the gym" (pt-stated)   On track       Clinical Goals: Over the next 30 days, patient will verbalize engagement in physical activity as directed by the bariatric team.  Interventions: RN CM reinforced importance of exercise as it related to weight loss and reduction of blood glucose levels and overall improvement of cardiovascular health and wellbeing.  10/11/2018 Provided emotional support and reassurance. Reinforced importance of exercise on reduction of weight, improvement in Cardiovascular health and DM control    . COMPLETED: "I want my medications to be consistent so I can schedule my bariatric surgery" (pt-stated)        Ms. Allington is experiencing a high pill burden and feelings of stress and overwhelm related to the number of medications he/she is taking. She states her medications need to be consistent for 6 months prior to receiving gastric bypass surgery.    Clinical Goal(s): Over the next 90 days, Ms. Suchocki will be adherent to all medications as evidenced by a percentage of days covered [facility]) report of >80%.   Interventions:  Updated medication list       The patient  verbalized understanding of instructions provided today and declined a print copy of patient instruction materials.   Telephone follow up appointment with CCM team member scheduled for: 1 month

## 2018-11-05 NOTE — Chronic Care Management (AMB) (Signed)
  Chronic Care Management   Follow Up Note   11/05/2018 Name: Summer [name] MRN: [id number] DOB: Mar 10, 1951  Referred by: Steele Sizer, MD Reason for referral : Chronic Care Management (follow up to upcoming barriatric surgery)   Subjective: "I have my morning blood sugars down between 90-130s.   Objective:  Lab Results  Component Value Date   HGBA1C 8.1 (A) 08/27/2018   HGBA1C 8.1 08/27/2018   HGBA1C 8.1 (A) 08/27/2018   HGBA1C 8.1 (A) 08/27/2018   BP Readings from Last 3 Encounters:  10/31/18 136/62  10/16/18 134/68  08/27/18 130/60    Assessment: Summer [name] is a 68 year old female primary care patient of Dr. Steele Sizer who was referred to the CCM program for assistance with chronic disease management, care coordination, and medication management.Summer [name] was referred to [facility] 05/27/2018 and continues to be followed by the CCM Team. Today CCM RN CM reached out to Summer [name] by phone to discuss goals and confirm patient is now engaged with the Barriatric clinic at [facility].  Summer [name] is attending nutrition classes at [facility] as part of her preparation for Bariatric surgery. She has lost 6 lbs. She walks for exercise as tolerated but does not wish to go back to the gym. She now has better control of her blood sugars through nutrition and medication compliance. Her BP remains within good range. She denies problems with medications affordability at this time as her Medicare is covering her medication cost until she is in the "doughnut hole". She is taking all medications that are prescribed as medication adherence is an important part of her readiness for surgery.  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 She was offered resources for counseling but declines at this time as she will see psychologist in April as part of her Bariatric surgery preparation.  Goals Addressed            This Visit's Progress   . "I need motivation to get back in the gym"  (pt-stated)   Not on track     Clinical Goals: Over the next 30 days, patient will verbalize engagement in physical activity as directed by the bariatric team.  Interventions: RN CM reinforced importance of exercise as it related to weight loss and reduction of blood glucose levels and overall improvement of cardiovascular health and wellbeing.  10/11/2018 Provided emotional support and reassurance. Reinforced importance of exercise on reduction of weight, improvement in Cardiovascular health and DM control      Telephone follow up appointment with CCM team member scheduled for:1 month   SIGNATURE

## 2018-11-07 ENCOUNTER — Other Ambulatory Visit: Payer: Self-pay | Admitting: Family Medicine

## 2018-11-07 DIAGNOSIS — K219 Gastro-esophageal reflux disease without esophagitis: Secondary | ICD-10-CM

## 2018-11-18 DIAGNOSIS — I10 Essential (primary) hypertension: Secondary | ICD-10-CM | POA: Diagnosis not present

## 2018-11-18 DIAGNOSIS — E1122 Type 2 diabetes mellitus with diabetic chronic kidney disease: Secondary | ICD-10-CM | POA: Diagnosis not present

## 2018-11-18 DIAGNOSIS — N183 Chronic kidney disease, stage 3 (moderate): Secondary | ICD-10-CM | POA: Diagnosis not present

## 2018-11-20 ENCOUNTER — Encounter: Payer: Medicare HMO | Attending: Surgery | Admitting: Dietician

## 2018-11-20 ENCOUNTER — Encounter: Payer: Self-pay | Admitting: Dietician

## 2018-11-20 ENCOUNTER — Other Ambulatory Visit: Payer: Self-pay

## 2018-11-20 VITALS — Ht 62.0 in | Wt 250.8 lb

## 2018-11-20 DIAGNOSIS — E66813 Obesity, class 3: Secondary | ICD-10-CM

## 2018-11-20 DIAGNOSIS — E785 Hyperlipidemia, unspecified: Secondary | ICD-10-CM | POA: Insufficient documentation

## 2018-11-20 DIAGNOSIS — Z794 Long term (current) use of insulin: Secondary | ICD-10-CM

## 2018-11-20 DIAGNOSIS — E119 Type 2 diabetes mellitus without complications: Secondary | ICD-10-CM | POA: Diagnosis not present

## 2018-11-20 DIAGNOSIS — Z713 Dietary counseling and surveillance: Secondary | ICD-10-CM | POA: Diagnosis not present

## 2018-11-20 DIAGNOSIS — Z6841 Body Mass Index (BMI) 40.0 and over, adult: Secondary | ICD-10-CM | POA: Diagnosis not present

## 2018-11-20 DIAGNOSIS — E1140 Type 2 diabetes mellitus with diabetic neuropathy, unspecified: Secondary | ICD-10-CM

## 2018-11-20 NOTE — Progress Notes (Signed)
Appt start time: 1500 end time:  1530.  Assessment:   #2 SWL Appointment.   Start Wt at NDES: 254lbs Wt: 250.8lbs Ht: 5'2" BMI: 45.87  Preferred Learning Style:   Auditory  Hands on   Learning Readiness:   Change in progress  MEDICATIONS: acetaminophen, albuterol inhaler, amLODipine, aspirin, DULoxetine, furosemide, icosapent ethyl, NPH 70-30 insulin, irbesartan-hydrochlorothiazide, levothyroxine, metFORMIN, metorprolol tartrate, Osteo bi-flex, montelukast, Omega-3 fish oil, Potassium, pregabalin, probiotic, rosuvastatin, vitamin C, vitamin D  Progress:   Weight decrease of 1.1lbs in the past month.   Patient reports fasting BG of 139 today, states BGs are typically in 130s.   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  DIETARY INTAKE: 24-hr recall:  Breakfast: 2 boiled eggs and fruit  Snack: none  Lunch: crabmeat chunks with plant-based butter; sandwich with tuna bologna or spam; often not hungry Snack: none Dinner: takeout food ie sub sandwich without bread, ground beef patty Snacks: (3-4, often up late at night) yogurt, sugar free jello, or banana, fig bar Beverages: water, sugar free flavored water  Usual physical activity: chair exercises 10 minutes, once daily  Diet to Follow: Current eating pattern, limit to small portions of carbs (1/3 - 2/3 cup or 1 slice bread) with meals              Nutritional Diagnosis:  -3.3 Overweight/obesity related to history of excess calories and physical inactivity as evidenced by patient current BMI of 45.87 following dietary guidelines for continued weight loss prior to bariatric surgery.              Intervention:    . Nutrition counseling for weight loss prior to bariatric surgery. . Discussed goals for carb intake in progression towards bariatric diet.  . Commended patient for positive changes she has made.  Marland Kitchen Updated nutrition goals with input from patient.  Teaching Method Utilized:  Visual Auditory Hands on  Handouts given during visit include:  Goals and instructions   Barriers to learning/adherence to lifestyle change: none  Demonstrated degree of understanding via:  Teach Back   Monitoring/Evaluation:  Dietary intake, exercise, and body weight 12/20/18.

## 2018-11-20 NOTE — Patient Instructions (Signed)
   Great job including proteins and veggies and fruits!  Allow for small portions of carbs consistently to help with blood sugar control and energy. Aim for 1/3-2/3 cup of a starch such as rice, potato, or pasta, or 1 slice of bread or 5-6 crackers.   Continue with chair exercises and daily activity as tolerated.

## 2018-11-21 ENCOUNTER — Telehealth: Payer: Self-pay | Admitting: Dietician

## 2018-11-21 NOTE — Telephone Encounter (Signed)
Called patient to change her next appointment due to scheduling conflict. Rescheduled for 12/19/18 at 1:30pm.

## 2018-11-25 ENCOUNTER — Ambulatory Visit: Payer: Medicare HMO | Admitting: Podiatry

## 2018-11-25 ENCOUNTER — Encounter: Payer: Self-pay | Admitting: Podiatry

## 2018-11-25 ENCOUNTER — Other Ambulatory Visit: Payer: Self-pay

## 2018-11-25 DIAGNOSIS — S91114A Laceration without foreign body of right lesser toe(s) without damage to nail, initial encounter: Secondary | ICD-10-CM

## 2018-11-25 DIAGNOSIS — M79674 Pain in right toe(s): Secondary | ICD-10-CM

## 2018-11-25 DIAGNOSIS — M79675 Pain in left toe(s): Secondary | ICD-10-CM

## 2018-11-25 DIAGNOSIS — B351 Tinea unguium: Secondary | ICD-10-CM

## 2018-11-25 DIAGNOSIS — E1142 Type 2 diabetes mellitus with diabetic polyneuropathy: Secondary | ICD-10-CM

## 2018-11-25 NOTE — Progress Notes (Signed)
This patient presents the office with chief complaint of a cut that she sustained trimming her nails about 10 weeks ago.weeks ago.  She states that the toe has still not completely healed.  She says she has difficulty wearing bandages since they keep falling off.She feels the toe is improving since her last visit.  She presents to the office for nail care today also.  General Appearance  Alert, conversant and in no acute stress.  Vascular  Dorsalis pedis and posterior tibial  pulses are palpable  bilaterally.  Capillary return is within normal limits  bilaterally. Temperature is within normal limits  bilaterally.  Neurologic  Senn-Weinstein monofilament wire test absent  bilaterally. Muscle power within normal limits bilaterally.  Nails Thick disfigured discolored nails with subungual debris  from hallux to fifth toes bilaterally. No evidence of bacterial infection or drainage bilaterally.  Orthopedic  No limitations of motion  feet .  No crepitus or effusions noted.  No bony pathology or digital deformities noted. Charcot foot noted.  Mallet toe  B/l.  Skin  normotropic skin with no porokeratosis noted bilaterally.  No signs of infections or ulcers noted.  There is a skin laceration at the distal aspect of the second toe right foot.  the laceration  appears callused and there is a gapping between the two edges.  No drainage noted.   The skin is closing and is minimally open.  Laceration second toe right foot  Onychomycosis  ROV.  Evaluated and treated the laceration second toe right foot.  This patient is diabetic with absent L OPS.  The necrotic tissue was debrided at laceration site..  Her laceration is closing.  Her necrotic tissue is debrided.  Bandage applied  Second toe right foot.  Debride nails.  Return to the clinic in 3 months for nail care.  Patient was told to return to the office if there is problems with the healing from the laceration second toe right foot.  Gardiner Barefoot DPM

## 2018-12-02 ENCOUNTER — Encounter: Payer: Self-pay | Admitting: Family Medicine

## 2018-12-02 ENCOUNTER — Ambulatory Visit (INDEPENDENT_AMBULATORY_CARE_PROVIDER_SITE_OTHER): Payer: Medicare HMO | Admitting: Family Medicine

## 2018-12-02 ENCOUNTER — Other Ambulatory Visit: Payer: Self-pay

## 2018-12-02 VITALS — BP 130/70 | HR 88 | Temp 97.9°F | Resp 16 | Ht 62.0 in | Wt 249.6 lb

## 2018-12-02 DIAGNOSIS — J4521 Mild intermittent asthma with (acute) exacerbation: Secondary | ICD-10-CM

## 2018-12-02 DIAGNOSIS — L97509 Non-pressure chronic ulcer of other part of unspecified foot with unspecified severity: Secondary | ICD-10-CM

## 2018-12-02 DIAGNOSIS — J069 Acute upper respiratory infection, unspecified: Secondary | ICD-10-CM | POA: Diagnosis not present

## 2018-12-02 DIAGNOSIS — Z794 Long term (current) use of insulin: Secondary | ICD-10-CM

## 2018-12-02 DIAGNOSIS — E11621 Type 2 diabetes mellitus with foot ulcer: Secondary | ICD-10-CM

## 2018-12-02 MED ORDER — FLUTICASONE FUROATE-VILANTEROL 100-25 MCG/INH IN AEPB: 1.0000 | INHALATION_SPRAY | Freq: Every day | RESPIRATORY_TRACT | 0 refills | Status: DC

## 2018-12-02 MED ORDER — ICOSAPENT ETHYL 1 G PO CAPS: 2.0000 g | ORAL_CAPSULE | Freq: Two times a day (BID) | ORAL | 1 refills | Status: DC

## 2018-12-02 MED ORDER — HYDROCOD POLST-CPM POLST ER 10-8 MG/5ML PO SUER: 5.0000 mL | Freq: Two times a day (BID) | ORAL | 0 refills | Status: DC | PRN

## 2018-12-02 NOTE — Patient Instructions (Signed)
Coronavirus (COVID-19) Are you at risk?  Are you at risk for the Coronavirus (COVID-19)?  To be considered HIGH RISK for Coronavirus (COVID-19), you have to meet the following criteria:  . Traveled to [place], [place], [place], [place] or [place]; or in the [place] to [place], [place], [place], or [place]; and have fever, cough, and shortness of breath within the last 2 weeks of travel OR . Been in close contact with a person diagnosed with COVID-19 within the last 2 weeks and have fever, cough, and shortness of breath . IF YOU DO NOT MEET THESE CRITERIA, YOU ARE CONSIDERED LOW RISK FOR COVID-19.  What to do if you are HIGH RISK for COVID-19?  . If you are having a medical emergency, call 911. . Seek medical care right away. Before you go to a doctor's office, urgent care or emergency department, call ahead and tell them about your recent travel, contact with someone diagnosed with COVID-19, and your symptoms. You should receive instructions from your physician's office regarding next steps of care.  . When you arrive at healthcare provider, tell the healthcare staff immediately you have returned from visiting [place], [place], [place], [place] or [place]; or traveled in the [place] to [place], [place], [place], or [place]; in the last two weeks or you have been in close contact with a person diagnosed with COVID-19 in the last 2 weeks.   . Tell the health care staff about your symptoms: fever, cough and shortness of breath. . After you have been seen by a medical provider, you will be either: o Tested for (COVID-19) and discharged home on quarantine except to seek medical care if symptoms worsen, and asked to  - Stay home and avoid contact with others until you get your results (4-5 days)  - Avoid travel on public transportation if possible (such as bus, train, or airplane) or o Sent to the Emergency Department by EMS for evaluation, COVID-19 testing, and possible  admission depending on your condition and test results.  What to do if you are LOW RISK for COVID-19?  Reduce your risk of any infection by using the same precautions used for avoiding the common cold or flu:  . Wash your hands often with soap and warm water for at least 20 seconds.  If soap and water are not readily available, use an alcohol-based hand sanitizer with at least 60% alcohol.  . If coughing or sneezing, cover your mouth and nose by coughing or sneezing into the elbow areas of your shirt or coat, into a tissue or into your sleeve (not your hands). . Avoid shaking hands with others and consider head nods or verbal greetings only. . Avoid touching your eyes, nose, or mouth with unwashed hands.  . Avoid close contact with people who are sick. . Avoid places or events with large numbers of people in one location, like concerts or sporting events. . Carefully consider travel plans you have or are making. . If you are planning any travel outside or inside the [place], visit the CDC's Travelers' Health webpage for the latest health notices. . If you have some symptoms but not all symptoms, continue to monitor at home and seek medical attention if your symptoms worsen. . If you are having a medical emergency, call 911.   ADDITIONAL HEALTHCARE OPTIONS FOR PATIENTS  [facility] / e-Visit: [email or web address]         [facility]: [phone]  [facility]: [phone]                   [facility]: [phone]   

## 2018-12-02 NOTE — Progress Notes (Signed)
Name: Summer [name]   MRN: [id number]    DOB: March 12, 1951   Date:12/02/2018       Progress Note  Subjective  Chief Complaint  Chief Complaint  Patient presents with  . Generalized Body Aches  . Sore Throat  . Cough    HPI  URI/acute exacerbation of asthma: she has not travelled in the past month . She is in the high risk category but does not qualify to be tested. She has chronic SOB and does not seem to be different than her baseline.  She states symptoms started with rhinorrhea, sore throat, nasal congestion and cough. She also has body aches and fatigue. She states feels like the flu. She states she would have stayed at home if it was not because of COVID-19 outbreak. No rashes, change in bowel movements. She has intermittent wheezing. Proventil has not helped with symptoms. She has tried mucinex , dayquil and nyquil. Symptoms started 6 days ago.   DMIi: glucose has been higher than usual in the 200's fasting, she is using sugar free cough medications. She states she has been eating more than usual since she has been sick. She also has been drinking more often because her mouth has been dry. She hs ulcer on 2nd toe right foot seeing Dr. Prudence Davidson.   Patient Active Problem List   Diagnosis Date Noted  . (HFpEF) heart failure with preserved ejection fraction ([place]) 08/31/2017  . Moderate persistent asthma 08/31/2017  . Hyperlipidemia due to type 2 diabetes mellitus ([place]) 06/14/2017  . Essential hypertension 10/06/2016  . Morbid obesity with BMI of 40.0-44.9, adult ([place]) 10/06/2016  . Dyslipidemia associated with type 2 diabetes mellitus ([place]) 08/18/2016  . Charcot foot due to diabetes mellitus ([place]) 02/22/2016  . Hypertriglyceridemia 07/27/2015  . Acquired abduction deformity of foot 07/12/2015  . Osteoarthritis of subtalar joint 07/12/2015  . Poorly controlled type 2 diabetes mellitus with neuropathy ([place]) 07/12/2015  . Arthritis of foot, degenerative 07/12/2015  . Carpal tunnel syndrome  04/17/2015  . Chronic constipation 04/17/2015  . Insomnia, persistent 04/17/2015  . Chronic kidney disease (CKD), stage III (moderate) ([place]) 04/17/2015  . Decreased exercise tolerance 04/17/2015  . Diabetes mellitus with polyneuropathy ([place]) 04/17/2015  . Gastro-esophageal reflux disease without esophagitis 04/17/2015  . Bursitis, trochanteric 04/17/2015  . Cephalalgia 04/17/2015  . Benign hypertension 04/17/2015  . Adult hypothyroidism 04/17/2015  . Hearing loss 04/17/2015  . Chronic recurrent major depressive disorder ([place]) 04/17/2015  . Neurogenic claudication 04/17/2015  . Obesity (BMI 35.0-39.9 without comorbidity) 04/17/2015  . Hypo-ovarianism 04/17/2015  . Perennial allergic rhinitis with seasonal variation 04/17/2015  . Acne erythematosa 04/17/2015  . Dyskinesia, tardive 04/17/2015  . Memory loss 04/17/2015  . Impingement syndrome of shoulder 04/17/2015  . Dermatitis, stasis 04/17/2015  . Obstructive sleep apnea 05/14/2014  . Dyspnea 05/06/2014  . Hyperlipidemia 02/06/2012  . LBP (low back pain) 09/16/2008    Past Surgical History:  Procedure Laterality Date  . ANKLE SURGERY Left approx Jan 2018  . CATARACT EXTRACTION  01/2011   right  . eye lid surgery  2013   bilateral  . FOOT SURGERY    . NECK SURGERY    . TUBAL LIGATION    . VAGINAL HYSTERECTOMY  1989    Family History  Problem Relation Age of Onset  . Heart attack Mother   . Aneurysm Mother   . Heart attack Maternal Grandfather   . Diabetes Maternal Grandfather     Social History   Socioeconomic History  .  Marital status: Significant Other    Spouse name: Not on file  . Number of children: 1  . Years of education: Not on file  . Highest education level: High school graduate  Occupational History  . Occupation: retired  Scientific laboratory technician  . Financial resource strain: Not hard at all  . Food insecurity:    Worry: Never true    Inability: Never true  . Transportation needs:    Medical: No     Non-medical: No  Tobacco Use  . Smoking status: Never Smoker  . Smokeless tobacco: Never Used  Substance and Sexual Activity  . Alcohol use: No    Alcohol/week: 0.0 - 1.0 standard drinks    Frequency: Never  . Drug use: No  . Sexual activity: Yes    Partners: Male  Lifestyle  . Physical activity:    Days per week: 0 days    Minutes per session: 0 min  . Stress: Rather much  Relationships  . Social connections:    Talks on phone: More than three times a week    Gets together: Once a week    Attends religious service: Never    Active member of club or organization: No    Attends meetings of clubs or organizations: Never    Relationship status: Living with partner  . Intimate partner violence:    Fear of current or ex partner: No    Emotionally abused: No    Physically abused: No    Forced sexual activity: No  Other Topics Concern  . Not on file  Social History Narrative   Son lives in [place] (retired from [employer]) and her granddaughter (12yr old) [place].     Current Outpatient Medications:  .  ACCU-CHEK SOFTCLIX LANCETS lancets, , Disp: , Rfl:  .  acetaminophen (TYLENOL 8 HOUR) 650 MG CR tablet, Take 1 tablet (650 mg total) by mouth every 8 (eight) hours as needed for pain., Disp: 90 tablet, Rfl: 2 .  albuterol (PROVENTIL HFA) 108 (90 Base) MCG/ACT inhaler, Inhale 2 puffs into the lungs 2 (two) times daily as needed for shortness of breath. , Disp: , Rfl:  .  Alcohol Swabs (B-D SINGLE USE SWABS REGULAR) PADS, , Disp: , Rfl:  .  amLODipine (NORVASC) 2.5 MG tablet, Take 2.5 mg by mouth daily. , Disp: , Rfl: 3 .  ASPIRIN LOW DOSE 81 MG EC tablet, TAKE 1 TABLET (81 MG TOTAL) BY MOUTH DAILY., Disp: 30 tablet, Rfl: 0 .  Blood Glucose Monitoring Suppl (ACCU-CHEK AVIVA PLUS) w/Device KIT, , Disp: , Rfl:  .  DULoxetine (CYMBALTA) 60 MG capsule, Take 1 capsule (60 mg total) by mouth daily., Disp: 90 capsule, Rfl: 1 .  furosemide (LASIX) 20 MG tablet, Take  1 tablet (20 mg total) by mouth daily., Disp: 90 tablet, Rfl: 0 .  insulin NPH-regular Human (70-30) 100 UNIT/ML injection, Inject 90 Units into the skin 2 (two) times daily with a meal., Disp: , Rfl:  .  irbesartan-hydrochlorothiazide (AVALIDE) 300-12.5 MG tablet, Take 1 tablet by mouth daily., Disp: 90 tablet, Rfl: 1 .  levothyroxine (SYNTHROID, LEVOTHROID) 25 MCG tablet, TAKE 1 TABLET EVERY DAY EXCEPT TAKE 2 TABLETS ON SUNDAYS, Disp: 100 tablet, Rfl: 0 .  metFORMIN (GLUCOPHAGE-XR) 500 MG 24 hr tablet, Take 500 mg by mouth. 2 tablets daily (total of 1000 mg daily), Disp: , Rfl:  .  metoprolol tartrate (LOPRESSOR) 25 MG tablet, Take 1 tablet (25 mg total) by mouth  2 (two) times daily., Disp: 180 tablet, Rfl: 1 .  Misc Natural Products (OSTEO BI-FLEX ADV JOINT SHIELD PO), Take by mouth 2 (two) times a week., Disp: , Rfl:  .  montelukast (SINGULAIR) 10 MG tablet, Take 1 tablet (10 mg total) by mouth at bedtime., Disp: 90 tablet, Rfl: 1 .  Omega-3 Fatty Acids (FISH OIL) 1000 MG CAPS, Take by mouth., Disp: , Rfl:  .  omeprazole (PRILOSEC) 40 MG capsule, Take 1 capsule (40 mg total) by mouth daily., Disp: 90 capsule, Rfl: 0 .  Potassium 99 MG TABS, Take by mouth., Disp: , Rfl:  .  pregabalin (LYRICA) 300 MG capsule, Take 2 capsules (600 mg total) by mouth daily. (Patient taking differently: Take 600 mg by mouth daily. ), Disp: 180 capsule, Rfl: 1 .  Probiotic Product (PROBIOTIC-10 PO), Take by mouth., Disp: , Rfl:  .  ranitidine (ZANTAC) 150 MG tablet, Take 1 tablet (150 mg total) by mouth at bedtime., Disp: 90 tablet, Rfl: 0 .  RELION INSULIN SYRINGE 1ML/31G 31G X 5/16" 1 ML MISC, , Disp: , Rfl:  .  rosuvastatin (CRESTOR) 20 MG tablet, Take 1 tablet (20 mg total) by mouth daily., Disp: 90 tablet, Rfl: 1 .  vitamin C (ASCORBIC ACID) 500 MG tablet, Take 500 mg by mouth 2 (two) times daily. , Disp: , Rfl:  .  VITAMIN D, CHOLECALCIFEROL, PO, Take 1,000 Units by mouth daily. , Disp: , Rfl:   Allergies   Allergen Reactions  . Codeine Other (See Comments)    "TRIPPED OUT"  DIDN'T LIKE THE [place]  . Atorvastatin     muscle pain  . Latex Rash  . Zolpidem Other (See Comments)    Sleep walk    I personally reviewed active problem list, medication list, allergies, family history, social history with the patient/caregiver today.   ROS  Ten systems reviewed and is negative except as mentioned in HPI   Objective  Vitals:   12/02/18 1311  BP: 130/70  Pulse: 88  Resp: 16  Temp: 97.9 F (36.6 C)  TempSrc: Oral  SpO2: 99%  Weight: 249 lb 9.6 oz (113.2 kg)  Height: 5' 2" (1.575 m)    Body mass index is 45.65 kg/m.  Physical Exam  Constitutional: Patient appears well-developed and well-nourished. Obese  No distress.  HEENT: head atraumatic, normocephalic, pupils equal and reactive to light, ears normal TM bilaterally,  neck supple, throat within normal limits Cardiovascular: Normal rate, regular rhythm and normal heart sounds.  No murmur heard. No BLE edema. Pulmonary/Chest: Effort normal and breath sounds normal. No respiratory distress. Abdominal: Soft.  There is no tenderness. Skin: ulceration on right second toe, mild erythema on nail bed, tip of toe. Keep follow up with Dr. Prudence Davidson  Psychiatric: Patient has a normal mood and affect. behavior is normal. Judgment and thought content normal.   PHQ2/9: Depression screen [facility] 2/9 12/02/2018 10/16/2018 09/18/2018 08/27/2018 07/26/2018  Decreased Interest _0 Down, Depressed, Hopeless _1 PHQ - 2 Score _2 Altered sleeping 3 3 - 3 0  Tired, decreased energy 3 3 - 3 3  Change in appetite 3 3 - 3 3  Feeling bad or failure about yourself  2 2 - 3 3  Trouble concentrating 3 3 - 3 3  Moving slowly or fidgety/restless 0 2 - 2 1  Suicidal thoughts 3 3 - 3 0  PHQ-9 Score 21 25 -  25 19  Difficult doing work/chores Very difficult Very difficult - Extremely dIfficult -  Some recent data might be hidden   phq9  positive   Fall Risk: Fall Risk  11/20/2018 10/23/2018 10/16/2018 09/18/2018 07/26/2018  Falls in the past year? 0 0 0 0 0  Comment - - - - -  Number falls in past yr: - - - - 0  Injury with Fall? - - - - -  Risk Factor Category  - - - - -  Comment - - - - -  Risk for fall due to : - - - - -  Risk for fall due to: Comment - - - - -  Follow up - - - - -     Assessment & Plan  1. Mild intermittent asthma with acute exacerbation  - fluticasone furoate-vilanterol (BREO ELLIPTA) 100-25 MCG/INH AEPB; Inhale 1 puff into the lungs daily.  Dispense: 60 each; Refill: 0  2. Acute URI  - chlorpheniramine-HYDROcodone (TUSSIONEX PENNKINETIC ER) 10-8 MG/5ML SUER; Take 5 mLs by mouth every 12 (twelve) hours as needed.  Dispense: 140 mL; Refill: 0a  Avoid nyquil   3. Type 2 diabetes mellitus with foot ulcer, with long-term current use of insulin ([name])  Continue follow up with Dr. Prudence Davidson Follow diabetic diet to avoid glucose spiking, she has follow up with Dr. Manfred Shirts tomorrow

## 2018-12-02 NOTE — Addendum Note (Signed)
Addended by: Inda Coke on: 12/02/2018 01:50 PM   Modules accepted: Orders

## 2018-12-07 ENCOUNTER — Other Ambulatory Visit: Payer: Self-pay | Admitting: Family Medicine

## 2018-12-09 ENCOUNTER — Telehealth: Payer: Self-pay | Admitting: Dietician

## 2018-12-09 NOTE — Telephone Encounter (Signed)
Patient called to reschedule her appointment on 12/19/18, as she has another appointment overlapping that time. Rescheduled to 12/25/18 at 1:30pm.

## 2018-12-11 ENCOUNTER — Ambulatory Visit (INDEPENDENT_AMBULATORY_CARE_PROVIDER_SITE_OTHER): Payer: Medicare HMO | Admitting: Orthotics

## 2018-12-11 ENCOUNTER — Other Ambulatory Visit: Payer: Self-pay

## 2018-12-11 DIAGNOSIS — B351 Tinea unguium: Secondary | ICD-10-CM

## 2018-12-11 DIAGNOSIS — M79675 Pain in left toe(s): Secondary | ICD-10-CM

## 2018-12-11 DIAGNOSIS — M19072 Primary osteoarthritis, left ankle and foot: Secondary | ICD-10-CM

## 2018-12-11 DIAGNOSIS — M79674 Pain in right toe(s): Secondary | ICD-10-CM

## 2018-12-11 DIAGNOSIS — S91114A Laceration without foreign body of right lesser toe(s) without damage to nail, initial encounter: Secondary | ICD-10-CM

## 2018-12-11 DIAGNOSIS — E1142 Type 2 diabetes mellitus with diabetic polyneuropathy: Secondary | ICD-10-CM

## 2018-12-11 DIAGNOSIS — M205X1 Other deformities of toe(s) (acquired), right foot: Secondary | ICD-10-CM | POA: Diagnosis not present

## 2018-12-11 NOTE — Progress Notes (Signed)

## 2018-12-16 ENCOUNTER — Encounter: Payer: Self-pay | Admitting: Family Medicine

## 2018-12-16 ENCOUNTER — Ambulatory Visit (INDEPENDENT_AMBULATORY_CARE_PROVIDER_SITE_OTHER): Payer: Medicare HMO | Admitting: Family Medicine

## 2018-12-16 VITALS — BP 135/80 | HR 75 | Ht 62.0 in | Wt 247.0 lb

## 2018-12-16 DIAGNOSIS — K219 Gastro-esophageal reflux disease without esophagitis: Secondary | ICD-10-CM

## 2018-12-16 DIAGNOSIS — F331 Major depressive disorder, recurrent, moderate: Secondary | ICD-10-CM

## 2018-12-16 DIAGNOSIS — N183 Chronic kidney disease, stage 3 unspecified: Secondary | ICD-10-CM

## 2018-12-16 DIAGNOSIS — E039 Hypothyroidism, unspecified: Secondary | ICD-10-CM | POA: Diagnosis not present

## 2018-12-16 DIAGNOSIS — E1169 Type 2 diabetes mellitus with other specified complication: Secondary | ICD-10-CM

## 2018-12-16 DIAGNOSIS — I10 Essential (primary) hypertension: Secondary | ICD-10-CM

## 2018-12-16 DIAGNOSIS — E785 Hyperlipidemia, unspecified: Secondary | ICD-10-CM

## 2018-12-16 MED ORDER — LEVOTHYROXINE SODIUM 25 MCG PO TABS: ORAL_TABLET | ORAL | 0 refills | Status: DC

## 2018-12-16 NOTE — Progress Notes (Signed)
Name: Summer [name]   MRN: [id number]    DOB: [birth year]   Date:12/16/2018       Progress Note  Subjective  Chief Complaint  Chief Complaint  Patient presents with  . Medication Refill  . Hypertension    Denies any symptoms  . Asthma    She sees Pulmonologist and states her weight negatively impacts her breathing  . Depression  . Diabetes    Still seeing Dr. Honor Junes for her diabetes- going for her visit with him later this week  . Hyperlipidemia  . Hypothyroidism  . Nausea    Woke up this morning with nausea and laid back down and it go worst   . Chronic Kidney Disease    Still seeing [facility]  . Obesity    Going through procedure for Gastric Bypass Surgery     I connected with@ on 12/16/18 at  2:00 PM EDT by a video enabled telemedicine application and verified that I am speaking with the correct person using two identifiers.  I discussed the limitations of evaluation and management by telemedicine and the availability of in person appointments. The patient expressed understanding and agreed to proceed. Staff also discussed with the patient that there may be a patient responsible charge related to this service. Patient Location: at home  Provider location: work   HPI  DMII: she is seeing Dr. Manfred Shirts, last A1C in December was 8.1% .She is really trying to lose weight and plans on having bariatric surgery Summer of 2020. She is taking  70/30 insulin 100 units BID , and Lyrica through medication assistance program. She is also taking Metformin. She has polyphagia, polydipsia and nocturia, She has CKI stage III, dyslipidemia, neuropathy  and had a history of foot ulcer. She is still under the care of nephrologist   Fatigue: she states today feeling tired and no appetite she denies fever, glucose was 138 this morning, no chest pain or palpitation BP 135/80 , she is afebrile. Advised   CKI III: she is under the care of [facility] and is getting  monitored , past two levels were stable. She states recent GFR was 58 at nephrologist   HTN: SBP still above goal, in the 140's, it improves with rest. She has SOB with activity, no chest pain. She states leg not as swollen , she was given norvasc in Nov by nephrologist   Major Depression: still feels down all the time, Phq9 is down to 17 , she went to psychiatrist and also therapist but cannot afford going back because of cost.She states she is trying to stay busy around her house, also going to her yard to get fresh air, watering her plants. She is still taking medication, seems to be improving   Hyperlipidemia: continue crestor, no muscle aches, has joint aches from OA, . Triglycerides was very high, and is now on Vascepa , whenever COVID-19 passes we will check labs again   Morbid obesity: high bmi and co-morbidities. She has DM, OA, GERD, HTN, she is going to [facility] and plans on having surgery next year, tried multiple diets in the past, difficulty exercising because of SOB and joint pains. She had  joined [employer] and was going 3 times a week, but since COVID-19 she has only been able to walk around her house for at most 5-10 minutes before she takes a break. Discussed moving more times daily   Hypothyroidism: taking one daily and two on sundays,  no hair loss, no dry skin, no change in bowel movements, no dysphagia.   Patient Active Problem List   Diagnosis Date Noted  . (HFpEF) heart failure with preserved ejection fraction ([place]) 08/31/2017  . Moderate persistent asthma 08/31/2017  . Hyperlipidemia due to type 2 diabetes mellitus ([place]) 06/14/2017  . Essential hypertension 10/06/2016  . Morbid obesity with BMI of 40.0-44.9, adult ([place]) 10/06/2016  . Dyslipidemia associated with type 2 diabetes mellitus ([place]) 08/18/2016  . Charcot foot due to diabetes mellitus ([name]) 02/22/2016  . Hypertriglyceridemia 07/27/2015  . Acquired abduction deformity of foot  07/12/2015  . Osteoarthritis of subtalar joint 07/12/2015  . Poorly controlled type 2 diabetes mellitus with neuropathy ([place]) 07/12/2015  . Arthritis of foot, degenerative 07/12/2015  . Carpal tunnel syndrome 04/17/2015  . Chronic constipation 04/17/2015  . Insomnia, persistent 04/17/2015  . Chronic kidney disease (CKD), stage III (moderate) ([place]) 04/17/2015  . Decreased exercise tolerance 04/17/2015  . Diabetes mellitus with polyneuropathy ([place]) 04/17/2015  . Gastro-esophageal reflux disease without esophagitis 04/17/2015  . Bursitis, trochanteric 04/17/2015  . Cephalalgia 04/17/2015  . Benign hypertension 04/17/2015  . Adult hypothyroidism 04/17/2015  . Hearing loss 04/17/2015  . Chronic recurrent major depressive disorder ([place]) 04/17/2015  . Neurogenic claudication 04/17/2015  . Obesity (BMI 35.0-39.9 without comorbidity) 04/17/2015  . Hypo-ovarianism 04/17/2015  . Perennial allergic rhinitis with seasonal variation 04/17/2015  . Acne erythematosa 04/17/2015  . Dyskinesia, tardive 04/17/2015  . Memory loss 04/17/2015  . Impingement syndrome of shoulder 04/17/2015  . Dermatitis, stasis 04/17/2015  . Obstructive sleep apnea 05/14/2014  . Dyspnea 05/06/2014  . Hyperlipidemia 02/06/2012  . LBP (low back pain) 09/16/2008    Past Surgical History:  Procedure Laterality Date  . ANKLE SURGERY Left approx Jan 2018  . CATARACT EXTRACTION  01/2011   right  . eye lid surgery  2013   bilateral  . FOOT SURGERY    . NECK SURGERY    . TUBAL LIGATION    . VAGINAL HYSTERECTOMY  1989    Family History  Problem Relation Age of Onset  . Heart attack Mother   . Aneurysm Mother   . Heart attack Maternal Grandfather   . Diabetes Maternal Grandfather     Social History   Socioeconomic History  . Marital status: Significant Other    Spouse name: Not on file  . Number of children: 1  . Years of education: Not on file  . Highest education level: High school graduate  Occupational  History  . Occupation: retired  Scientific laboratory technician  . Financial resource strain: Not hard at all  . Food insecurity:    Worry: Never true    Inability: Never true  . Transportation needs:    Medical: No    Non-medical: No  Tobacco Use  . Smoking status: Never Smoker  . Smokeless tobacco: Never Used  Substance and Sexual Activity  . Alcohol use: No    Alcohol/week: 0.0 - 1.0 standard drinks    Frequency: Never  . Drug use: No  . Sexual activity: Yes    Partners: Male  Lifestyle  . Physical activity:    Days per week: 0 days    Minutes per session: 0 min  . Stress: Rather much  Relationships  . Social connections:    Talks on phone: More than three times a week    Gets together: Once a week    Attends religious service: Never    Active member of club or organization: No  Attends meetings of clubs or organizations: Never    Relationship status: Living with partner  . Intimate partner violence:    Fear of current or ex partner: No    Emotionally abused: No    Physically abused: No    Forced sexual activity: No  Other Topics Concern  . Not on file  Social History Narrative   Son lives in [place] (retired from [employer]) and her granddaughter (81yr old) [place].     Current Outpatient Medications:  .  ACCU-CHEK SOFTCLIX LANCETS lancets, , Disp: , Rfl:  .  acetaminophen (TYLENOL 8 HOUR) 650 MG CR tablet, Take 1 tablet (650 mg total) by mouth every 8 (eight) hours as needed for pain., Disp: 90 tablet, Rfl: 2 .  albuterol (PROVENTIL HFA) 108 (90 Base) MCG/ACT inhaler, Inhale 2 puffs into the lungs 2 (two) times daily as needed for shortness of breath. , Disp: , Rfl:  .  Alcohol Swabs (B-D SINGLE USE SWABS REGULAR) PADS, , Disp: , Rfl:  .  amLODipine (NORVASC) 2.5 MG tablet, Take 2.5 mg by mouth daily. , Disp: , Rfl: 3 .  amLODipine (NORVASC) 5 MG tablet, , Disp: , Rfl:  .  ASPIRIN LOW DOSE 81 MG EC tablet, TAKE 1 TABLET (81 MG TOTAL) BY MOUTH DAILY.,  Disp: 30 tablet, Rfl: 0 .  Blood Glucose Monitoring Suppl (ACCU-CHEK AVIVA PLUS) w/Device KIT, , Disp: , Rfl:  .  DULoxetine (CYMBALTA) 60 MG capsule, Take 1 capsule (60 mg total) by mouth daily., Disp: 90 capsule, Rfl: 1 .  fluticasone furoate-vilanterol (BREO ELLIPTA) 100-25 MCG/INH AEPB, Inhale 1 puff into the lungs daily., Disp: 60 each, Rfl: 0 .  furosemide (LASIX) 20 MG tablet, Take 1 tablet (20 mg total) by mouth daily., Disp: 90 tablet, Rfl: 0 .  Icosapent Ethyl (VASCEPA) 1 g CAPS, Take 2 capsules (2 g total) by mouth 2 (two) times daily., Disp: 360 capsule, Rfl: 1 .  insulin NPH-regular Human (70-30) 100 UNIT/ML injection, Inject 90 Units into the skin 2 (two) times daily with a meal., Disp: , Rfl:  .  irbesartan-hydrochlorothiazide (AVALIDE) 300-12.5 MG tablet, Take 1 tablet by mouth daily., Disp: 90 tablet, Rfl: 1 .  levothyroxine (SYNTHROID, LEVOTHROID) 25 MCG tablet, TAKE 1 TABLET EVERY DAY EXCEPT TAKE 2 TABLETS ON SUNDAYS, Disp: 100 tablet, Rfl: 0 .  metFORMIN (GLUCOPHAGE-XR) 500 MG 24 hr tablet, Take 500 mg by mouth. 2 tablets daily (total of 1000 mg daily), Disp: , Rfl:  .  metoprolol tartrate (LOPRESSOR) 25 MG tablet, Take 1 tablet (25 mg total) by mouth 2 (two) times daily., Disp: 180 tablet, Rfl: 1 .  Misc Natural Products (OSTEO BI-FLEX ADV JOINT SHIELD PO), Take by mouth 2 (two) times a week., Disp: , Rfl:  .  montelukast (SINGULAIR) 10 MG tablet, Take 1 tablet (10 mg total) by mouth at bedtime., Disp: 90 tablet, Rfl: 1 .  Omega-3 Fatty Acids (FISH OIL) 1000 MG CAPS, Take by mouth., Disp: , Rfl:  .  omeprazole (PRILOSEC) 40 MG capsule, Take 1 capsule (40 mg total) by mouth daily., Disp: 90 capsule, Rfl: 0 .  Potassium 99 MG TABS, Take by mouth., Disp: , Rfl:  .  pregabalin (LYRICA) 300 MG capsule, Take 2 capsules (600 mg total) by mouth daily. (Patient taking differently: Take 600 mg by mouth daily. ), Disp: 180 capsule, Rfl: 1 .  Probiotic Product (PROBIOTIC-10 PO), Take by  mouth., Disp: , Rfl:  .  RELION  INSULIN SYRINGE 1ML/31G 31G X 5/16" 1 ML MISC, , Disp: , Rfl:  .  rosuvastatin (CRESTOR) 20 MG tablet, Take 1 tablet (20 mg total) by mouth daily., Disp: 90 tablet, Rfl: 1 .  vitamin C (ASCORBIC ACID) 500 MG tablet, Take 500 mg by mouth 2 (two) times daily. , Disp: , Rfl:  .  VITAMIN D, CHOLECALCIFEROL, PO, Take 1,000 Units by mouth daily. , Disp: , Rfl:  .  chlorpheniramine-HYDROcodone (TUSSIONEX PENNKINETIC ER) 10-8 MG/5ML SUER, Take 5 mLs by mouth every 12 (twelve) hours as needed. (Patient not taking: Reported on 12/16/2018), Disp: 140 mL, Rfl: 0 .  ranitidine (ZANTAC) 150 MG tablet, Take 1 tablet (150 mg total) by mouth at bedtime. (Patient not taking: Reported on 12/16/2018), Disp: 90 tablet, Rfl: 0  Allergies  Allergen Reactions  . Codeine Other (See Comments)    "TRIPPED OUT"  DIDN'T LIKE THE [place]  . Atorvastatin     muscle pain  . Latex Rash  . Zolpidem Other (See Comments)    Sleep walk    I personally reviewed active problem list, medication list, allergies, family history, social history with the patient/caregiver today.   ROS  Constitutional: Negative for fever or weight change.  Respiratory: Negative for cough , chronic  shortness of breath.   Cardiovascular: Negative for chest pain or palpitations.  Gastrointestinal: Negative for abdominal pain, no bowel changes.  Musculoskeletal: Negative for gait problem or joint swelling.  Skin: Negative for rash.  Neurological: Negative for dizziness , positive for intermittent  headache.  No other specific complaints in a complete review of systems (except as listed in HPI above).  Objective  Virtual encounter, vitals not obtained.  Body mass index is 45.18 kg/m.  Physical Exam  Awake, alert, no distress   PHQ2/9: Depression screen [facility] 2/9 12/16/2018 12/02/2018 10/16/2018 09/18/2018 08/27/2018  Decreased Interest '2 1 3 3 3  '$ Down, Depressed, Hopeless '2 3 3 2 2  '$ PHQ - 2 Score '4 4 6 5 5   '$ Altered sleeping '3 3 3 '$ - 3  Tired, decreased energy '2 3 3 '$ - 3  Change in appetite '3 3 3 '$ - 3  Feeling bad or failure about yourself  '2 2 2 '$ - 3  Trouble concentrating '2 3 3 '$ - 3  Moving slowly or fidgety/restless 0 0 2 - 2  Suicidal thoughts '1 3 3 '$ - 3  PHQ-9 Score '17 21 25 '$ - 25  Difficult doing work/chores Very difficult Very difficult Very difficult - Extremely dIfficult  Some recent data might be hidden   PHQ-2/9 Result is positive.    Fall Risk: Fall Risk  12/16/2018 11/20/2018 10/23/2018 10/16/2018 09/18/2018  Falls in the past year? 0 0 0 0 0  Comment - - - - -  Number falls in past yr: 0 - - - -  Injury with Fall? 0 - - - -  Risk Factor Category  - - - - -  Comment - - - - -  Risk for fall due to : - - - - -  Risk for fall due to: Comment - - - - -  Follow up - - - - -    Assessment & Plan  1. Dyslipidemia associated with type 2 diabetes mellitus ([place])  She has follow up with Dr. Manfred Shirts next week, glucose at home 130-150 's fasting  2. Benign hypertension  At goal, continue medication  3. Morbid (severe) obesity due to excess calories [facility])  She will try  going for 5 minutes walk 3-4 times daily   4. MDD (major depressive disorder), recurrent episode, moderate ([name])  phq9 improved, we will continue to monitor seems more cheerful   5. GERD without esophagitis  She stopped Ranitidine and states symptoms controlled without it  6. Chronic kidney disease (CKD), stage III (moderate) ([name])  Keep follow up with nephrologist and also ACE   7. Adult hypothyroidism  - levothyroxine (SYNTHROID, LEVOTHROID) 25 MCG tablet; One daily and two on sundays  Dispense: 100 tablet; Refill: 0   I discussed the assessment and treatment plan with the patient. The patient was provided an opportunity to ask questions and all were answered. The patient agreed with the plan and demonstrated an understanding of the instructions.  The patient was advised to call back or seek an in-person  evaluation if the symptoms worsen or if the condition fails to improve as anticipated.  I provided 25  minutes of non-face-to-face time during this encounter.

## 2018-12-19 ENCOUNTER — Ambulatory Visit: Payer: Self-pay | Admitting: Dietician

## 2018-12-19 DIAGNOSIS — E785 Hyperlipidemia, unspecified: Secondary | ICD-10-CM | POA: Diagnosis not present

## 2018-12-19 DIAGNOSIS — E1159 Type 2 diabetes mellitus with other circulatory complications: Secondary | ICD-10-CM | POA: Diagnosis not present

## 2018-12-19 DIAGNOSIS — E039 Hypothyroidism, unspecified: Secondary | ICD-10-CM | POA: Diagnosis not present

## 2018-12-19 DIAGNOSIS — Z794 Long term (current) use of insulin: Secondary | ICD-10-CM | POA: Diagnosis not present

## 2018-12-19 DIAGNOSIS — I10 Essential (primary) hypertension: Secondary | ICD-10-CM | POA: Diagnosis not present

## 2018-12-19 DIAGNOSIS — E1142 Type 2 diabetes mellitus with diabetic polyneuropathy: Secondary | ICD-10-CM | POA: Diagnosis not present

## 2018-12-19 DIAGNOSIS — E1169 Type 2 diabetes mellitus with other specified complication: Secondary | ICD-10-CM | POA: Diagnosis not present

## 2018-12-19 LAB — HM DIABETES FOOT EXAM

## 2018-12-25 ENCOUNTER — Encounter: Payer: Medicare HMO | Attending: Surgery | Admitting: Dietician

## 2018-12-25 ENCOUNTER — Other Ambulatory Visit: Payer: Self-pay

## 2018-12-25 ENCOUNTER — Encounter: Payer: Self-pay | Admitting: Dietician

## 2018-12-25 VITALS — Ht 62.0 in | Wt 251.5 lb

## 2018-12-25 DIAGNOSIS — Z713 Dietary counseling and surveillance: Secondary | ICD-10-CM | POA: Insufficient documentation

## 2018-12-25 DIAGNOSIS — E785 Hyperlipidemia, unspecified: Secondary | ICD-10-CM | POA: Insufficient documentation

## 2018-12-25 DIAGNOSIS — E1140 Type 2 diabetes mellitus with diabetic neuropathy, unspecified: Secondary | ICD-10-CM

## 2018-12-25 DIAGNOSIS — Z6841 Body Mass Index (BMI) 40.0 and over, adult: Secondary | ICD-10-CM | POA: Diagnosis not present

## 2018-12-25 DIAGNOSIS — E119 Type 2 diabetes mellitus without complications: Secondary | ICD-10-CM | POA: Insufficient documentation

## 2018-12-25 DIAGNOSIS — Z794 Long term (current) use of insulin: Secondary | ICD-10-CM

## 2018-12-25 NOTE — Patient Instructions (Signed)
   Continue to gradually increase physical activity, great job so far!  Keep making healthy food choices and controlling carb intake.

## 2018-12-25 NOTE — Progress Notes (Signed)
Appt start time: 1330 end time:  1400.  Assessment:   #3 SWL Appointment.   Start Wt at NDES: 254.9lbs Wt: 251.5lbs Ht: 5'2" BMI: 46.0  Preferred Learning Style:   Auditory  Hands on    Learning Readiness:   Change in progress  MEDICATIONS: acetaminophen, albuterol inhaler, amLODipine, aspirin, DULoxetine, furosemide, icosapent ethyl, NPH 70-30 insulin, irbesartan-hydrochlorothiazide, levothyroxine, metFORMIN, metorprolol tartrate, Osteo bi-flex, montelukast, Omega-3 fish oil, Potassium, pregabalin, probiotic, rosuvastatin, vitamin C, vitamin D  Progress:  Patient reports recent HbA1C of 7.3%, which is down from 9%.   Weight has increased slightly from 250.8 at previous visit; patient reports weight of 248lbs at another office about 1 week ago.   Patient reports improvement in knee pain recently, and she is increasing her physical activity, now planting flowers.   DIETARY INTAKE: 24-hr recall:  Breakfast: 2 eggs and fruit; or "Just Crack an egg" product.   Snack: none  Lunch: sandwich with tuna, bologna, or spam; or crabmeat chunks.  Snack: usually none Dinner: hamburger steak or deli meat + frozen veg ie spinach, greens, broccoli, cauliflower Snack: (2-3 during evening/ night) sugar free candy, nuts; sugar free jello; yogurt Beverages: water, sugar free flavored water; sugar free ginger ale maybe once a day.   Usual physical activity: chair exercises 5-10 minutes, 2x a week. Trying to increase daily walking when knee pain allows.   Diet to Follow: Continue with current eating pattern, emphasizing lean proteins and vegetables and fruits.  Continue to limit starchy foods and sweets.               Nutritional Diagnosis:  [place]-3.3 Overweight/obesity related to history of excess calories and physical inactivity as evidenced by patient current BMI of 46.0, following dietary guidelines for continued weight loss prior to bariatric surgery.              Intervention:    . Nutrition counseling for weight loss prior to upcoming bariatric surgery. . Patient continues to make gradual positive changes; BG control and pain level have improved.  . Goal is to continue with current pattern.   Teaching Method Utilized:  Visual Auditory   Handouts given during visit include:  Goals and instructions   Barriers to learning/adherence to lifestyle change: none  Demonstrated degree of understanding via:  Teach Back   Monitoring/Evaluation:  Dietary intake, exercise, BG control, and body weight 01/22/19.

## 2019-01-06 ENCOUNTER — Other Ambulatory Visit: Payer: Self-pay | Admitting: Family Medicine

## 2019-01-06 DIAGNOSIS — I10 Essential (primary) hypertension: Secondary | ICD-10-CM

## 2019-01-06 DIAGNOSIS — J4541 Moderate persistent asthma with (acute) exacerbation: Secondary | ICD-10-CM

## 2019-01-06 NOTE — Telephone Encounter (Signed)
Refill request for Hypertension medication:  Metoprolol 25 mg  Last office visit pertaining to hypertension: 12/16/2018  BP Readings from Last 3 Encounters:  12/16/18 135/80  12/02/18 130/70  10/31/18 136/62    Lab Results  Component Value Date   CREATININE 1.47 (H) 05/01/2018   BUN 44 (H) 05/01/2018   NA 138 05/01/2018   K 5.0 05/01/2018   CL 101 05/01/2018   CO2 25 05/01/2018   Follow-ups on file. None indicated

## 2019-01-13 ENCOUNTER — Ambulatory Visit (INDEPENDENT_AMBULATORY_CARE_PROVIDER_SITE_OTHER): Payer: Medicare HMO | Admitting: Psychology

## 2019-01-13 ENCOUNTER — Ambulatory Visit: Payer: Medicare HMO | Admitting: Psychology

## 2019-01-13 DIAGNOSIS — F509 Eating disorder, unspecified: Secondary | ICD-10-CM | POA: Diagnosis not present

## 2019-01-20 ENCOUNTER — Ambulatory Visit: Payer: Medicare HMO | Admitting: Psychology

## 2019-01-22 ENCOUNTER — Encounter: Payer: Self-pay | Admitting: Dietician

## 2019-01-22 ENCOUNTER — Encounter: Payer: Medicare HMO | Attending: Surgery | Admitting: Dietician

## 2019-01-22 ENCOUNTER — Other Ambulatory Visit: Payer: Self-pay

## 2019-01-22 VITALS — Ht 62.0 in | Wt 253.9 lb

## 2019-01-22 DIAGNOSIS — Z6841 Body Mass Index (BMI) 40.0 and over, adult: Secondary | ICD-10-CM | POA: Diagnosis not present

## 2019-01-22 DIAGNOSIS — E785 Hyperlipidemia, unspecified: Secondary | ICD-10-CM | POA: Diagnosis not present

## 2019-01-22 DIAGNOSIS — E1140 Type 2 diabetes mellitus with diabetic neuropathy, unspecified: Secondary | ICD-10-CM

## 2019-01-22 DIAGNOSIS — Z794 Long term (current) use of insulin: Secondary | ICD-10-CM

## 2019-01-22 DIAGNOSIS — Z713 Dietary counseling and surveillance: Secondary | ICD-10-CM | POA: Diagnosis not present

## 2019-01-22 DIAGNOSIS — E119 Type 2 diabetes mellitus without complications: Secondary | ICD-10-CM | POA: Insufficient documentation

## 2019-01-22 NOTE — Progress Notes (Signed)
Appt start time: 1330 end time:  1400.  Assessment:   #4 SWL Appointment.   Start Wt at NDES: 254.9lbs Wt: 253.9lbs Ht: 5'3" BMI: 46.44  Preferred Learning Style:   Auditory  Hands on   Learning Readiness:   Change in progress  MEDICATIONS: acetaminophen, albuterol inhaler, amLODipine, aspirin, DULoxetine, furosemide, icosapent ethyl, NPH 70-30 insulin, irbesartan-hydrochlorothiazide, levothyroxine, metFORMIN, metorprolol tartrate, Osteo bi-flex, montelukast, Omega-3 fish oil, Potassium, pregabalin, probiotic, rosuvastatin, vitamin B12, vitamin C, vitamin D   Progress:   Weight increase of about 2lbs in the past month, likely due to increased snacking and less activity while staying home. Patient reports feeling more depression, stress due to pandemic and related restrictions.  Patient continues to have difficulty sleeping some nights, states she did not sleep at all last night. When unable to sleep she is more likely to snack during the night.  Some days her knee and leg pain is significant enough to eliminate all physical activity.   Husband has been cooking more meals recently, he often dishes and serves food to Ms. Brenn, and she states he serves large portions (and prepares higher-calorie foods); she therefore finds it difficult to control her food portions.   DIETARY INTAKE: 24-hr recall:  Breakfast: eggs and fruit or "Just crack and Egg"  Snack: maybe fruit cup   Lunch: sandwich or crabmeat Snack: none or fruit  Dinner: some fried foods, mac and cheese -- husband cooking more  Snack: has been staying full longer due to larger meals at dinner recently; sometimes awake during the night and will eat leftovers, other misc. choices  Beverages: water sugar free flavored water, stopped diet ginger ale due to sodium content  Usual physical activity: none recently due to knee/ leg pain, less motivation  Diet to Follow: Smaller portions of starchy foods Moderate portions of  protein foods Generous portions of low-carb vegetables               Nutritional Diagnosis:  [place]-3.3 Overweight/obesity related to history of excess calories and physical inactivity as evidenced by patient current BMI of 46.44 following dietary guidelines for continued weight loss prior to bariatric surgery.              Intervention:   . Nutrition counseling for weight loss prior to upcoming bariatric surgery. . Discussed importance of working on stress management, depression, and adequate sleep to control appetite and emotional eating, and increase motivation for physical activity. Encouraged patient to reach out to physician or therapist if needed.  . Reviewed some pre-op goals to promote slow eating and avoid foods feeling stuck in esophagus.  . Reviewed importance of reducing carb intake and emphasizing low-carb vegetables and lean proteins.   Teaching Method Utilized:  Visual Auditory   Handouts given during visit include:  Goals and Instructions (to be sent to patient by mail)   Barriers to learning/adherence to lifestyle change: none  Demonstrated degree of understanding via:  Teach Back   Monitoring/Evaluation:  Dietary intake, exercise, BG control, and body weight 02/20/19.

## 2019-01-22 NOTE — Patient Instructions (Signed)
   Resume smaller portions and low fat food choices -- concentrate on low-carb veggies as the biggest portion in the meal, and keep portions of starches small, and moderate protein portions.   Practice chewing each bite of food thoroughly, at least 20 times. This will slow eating and help with feeling full. It takes 20 minutes for the stomach to start feeling full, regardless of how much food is eaten.   Include some type of physical movement/ activity as much as possible/ tolerated.

## 2019-02-05 ENCOUNTER — Other Ambulatory Visit: Payer: Self-pay | Admitting: Family Medicine

## 2019-02-05 DIAGNOSIS — K219 Gastro-esophageal reflux disease without esophagitis: Secondary | ICD-10-CM

## 2019-02-05 DIAGNOSIS — F331 Major depressive disorder, recurrent, moderate: Secondary | ICD-10-CM

## 2019-02-19 ENCOUNTER — Telehealth: Payer: Self-pay | Admitting: Dietician

## 2019-02-19 NOTE — Telephone Encounter (Signed)
Called patient to remind her of new location for nutrition and diabetes, and to confirm her appointment for tomorrow, 02/20/19.

## 2019-02-20 ENCOUNTER — Encounter: Payer: Medicare HMO | Attending: Surgery | Admitting: Dietician

## 2019-02-20 ENCOUNTER — Encounter: Payer: Self-pay | Admitting: Dietician

## 2019-02-20 ENCOUNTER — Other Ambulatory Visit: Payer: Self-pay

## 2019-02-20 DIAGNOSIS — Z713 Dietary counseling and surveillance: Secondary | ICD-10-CM | POA: Insufficient documentation

## 2019-02-20 DIAGNOSIS — E119 Type 2 diabetes mellitus without complications: Secondary | ICD-10-CM | POA: Insufficient documentation

## 2019-02-20 DIAGNOSIS — Z6841 Body Mass Index (BMI) 40.0 and over, adult: Secondary | ICD-10-CM | POA: Insufficient documentation

## 2019-02-20 DIAGNOSIS — E785 Hyperlipidemia, unspecified: Secondary | ICD-10-CM | POA: Insufficient documentation

## 2019-02-20 NOTE — Progress Notes (Signed)
Appt start time: 1430 end time:  1500.  Assessment:   #5 SWL Appointment.   Start Wt at NDES: 254.9lbs Wt: 251.5lbs Ht: 5'2" BMI: 46  Preferred Learning Style:   Auditory  Hands on   Learning Readiness:   Change in progress  MEDICATIONS: acetaminophen, albuterol inhaler, amLODipine, aspirin, DULoxetine, furosemide, icosapent ethyl, NPH 70-30 insulin, irbesartan-hydrochlorothiazide, levothyroxine, metFORMIN, metorprolol tartrate, Osteo bi-flex, montelukast, Omega-3 fish oil, Potassium, pregabalin, probiotic, rosuvastatin, vitamin B12, vitamin C, vitamin D   Progress:  Patient reports increased financial stress recently.   She reports decreased snacking in the past month, still less activity with staying at home more.   Sometimes activity is limited due to knee pain.  She is working with her husband to reduce food portions, with some progress.   DIETARY INTAKE: 24-hr recall:  Breakfast: eggs and fruit  Snack: fruit or fig bar  Lunch: sometimes skips; crabmeat most often, or lunchable meal without crackers Snack: fruit or fig bar Dinner: largest meal -- meat + starch + veg; tries to limit starch portions. Snack: midnight 2 slices bologna or lunchable or [place] yogurt Beverages: water, sugar free flavored water, sugar free [employer] drinks.   Usual physical activity: housekeeping chores daily  Diet to Follow: Smaller portions of starchy foods Moderate portions of protein foods Generous portions of low-carb vegetables               Nutritional Diagnosis:  [place]-3.3 Overweight/obesity related to history of excess calories and physical inactivity as evidenced by patient current BMI of 46 following dietary guidelines for continued weight loss prior to bariatric surgery.              Intervention:   . Nutrition counseling for weight loss prior to upcoming bariatric surgery. . Patient continues to make gradual positive dietary changes. Exercise is more of a challenge to  increase due to knee pain.  Marland Kitchen Updated goals to begin preparing for pre- and post-op diets.   Teaching Method Utilized:  Visual Auditory Hands on  Handouts given during visit include:  Pre-op goals  Goals and instrutions    Barriers to learning/adherence to lifestyle change: none  Demonstrated degree of understanding via:  Teach Back   Monitoring/Evaluation:  Dietary intake, exercise, BG control, and body weight Follow up: 03/20/19.

## 2019-02-20 NOTE — Patient Instructions (Signed)
   Continue to eat small portions of starchy foods.   Try adding "easy" vegetables like grape or cherry tomatoes, carrots, celery, etc. With meat at lunch  Great job including protein foods with meals!  Gradually increase physical activity as able.   Work on slow eating and limiting fluids during meals.

## 2019-02-24 ENCOUNTER — Ambulatory Visit: Payer: Medicare HMO | Admitting: Podiatry

## 2019-03-06 ENCOUNTER — Telehealth: Payer: Self-pay | Admitting: Family Medicine

## 2019-03-06 DIAGNOSIS — E1165 Type 2 diabetes mellitus with hyperglycemia: Secondary | ICD-10-CM

## 2019-03-06 DIAGNOSIS — E1140 Type 2 diabetes mellitus with diabetic neuropathy, unspecified: Secondary | ICD-10-CM

## 2019-03-06 NOTE — Telephone Encounter (Signed)
pregabalin (LYRICA) 300 MG capsule [[id number]]   Pt states that [facility] is needing an RX form medication. Pt states that [facility] has faxed over request. Please advise

## 2019-03-07 ENCOUNTER — Other Ambulatory Visit: Payer: Self-pay | Admitting: Family Medicine

## 2019-03-17 DIAGNOSIS — I10 Essential (primary) hypertension: Secondary | ICD-10-CM | POA: Diagnosis not present

## 2019-03-17 DIAGNOSIS — E1122 Type 2 diabetes mellitus with diabetic chronic kidney disease: Secondary | ICD-10-CM | POA: Diagnosis not present

## 2019-03-17 DIAGNOSIS — N183 Chronic kidney disease, stage 3 (moderate): Secondary | ICD-10-CM | POA: Diagnosis not present

## 2019-03-20 ENCOUNTER — Encounter: Payer: Self-pay | Admitting: Dietician

## 2019-03-20 ENCOUNTER — Encounter: Payer: Medicare HMO | Attending: Surgery | Admitting: Dietician

## 2019-03-20 ENCOUNTER — Other Ambulatory Visit: Payer: Self-pay

## 2019-03-20 VITALS — Ht 62.0 in | Wt 255.0 lb

## 2019-03-20 DIAGNOSIS — E119 Type 2 diabetes mellitus without complications: Secondary | ICD-10-CM | POA: Diagnosis not present

## 2019-03-20 DIAGNOSIS — E785 Hyperlipidemia, unspecified: Secondary | ICD-10-CM | POA: Diagnosis not present

## 2019-03-20 DIAGNOSIS — Z6841 Body Mass Index (BMI) 40.0 and over, adult: Secondary | ICD-10-CM | POA: Diagnosis not present

## 2019-03-20 DIAGNOSIS — E66813 Obesity, class 3: Secondary | ICD-10-CM

## 2019-03-20 DIAGNOSIS — Z713 Dietary counseling and surveillance: Secondary | ICD-10-CM | POA: Diagnosis not present

## 2019-03-20 DIAGNOSIS — Z794 Long term (current) use of insulin: Secondary | ICD-10-CM

## 2019-03-20 DIAGNOSIS — E1140 Type 2 diabetes mellitus with diabetic neuropathy, unspecified: Secondary | ICD-10-CM

## 2019-03-20 NOTE — Patient Instructions (Signed)
   Keep working to control food portions and especially starches with meals.   Gradually increase physical exercise and movement as able; early morning or evening walk/ time outdoors could help with exercise and lifting mood.

## 2019-03-20 NOTE — Progress Notes (Signed)
Appt start time: 1430 end time:  1500.  Assessment:   #6 SWL Appointment.   Start Wt at [facility]: 254.9lbs Wt: 255.1lbs Ht: 5'2" BMI: 46.64  Preferred Learning Style:   Auditory  Hands on   Learning Readiness:   Change in progress  MEDICATIONS: acetaminophen, albuterol inhaler, amLODipine, aspirin, DULoxetine, furosemide, icosapent ethyl, NPH 70-30 insulin, irbesartan-hydrochlorothiazide, levothyroxine, metFORMIN, metorprolol tartrate, Osteo bi-flex, montelukast, Omega-3 fish oil, Potassium, pregabalin, probiotic, rosuvastatin,vitamin B12,vitamin C, vitamin D  Progress:  Patient reports struggle with stress, depression over the past few weeks, mostly due to COVID-19 restrictions and financial strain, which has led to some stress eating.   She feels like her diuretic medication is not working as well as usual, so feels she might have some fluid retention.   She continues to emphasize protein in her diet, and is consuming low-carb vegetables.   DIETARY INTAKE: 24-hr recall:  Breakfast: eggs and fruit  Snack: fig bar or fruit  Lunch: lunchable meal without crackers; crabmeat; usually skips (sleeps late and has "brunch") Snack: fig bar or fruit  Dinner: meat vegetables some starch ie potato or other starch, small - moderate portions Snack: midnight bologna and cheese, no bread or P3 protein snack Beverages: water, flavored water  Usual physical activity: decreased activity, some housework -- felt depressed and not motivated to leave bedroom at times  Diet to Follow: Eating at regular intervals with emphasis on lean protein and low-carb vegetables.  Small portions of starchy foods.               Nutritional Diagnosis:  [place]-3.3 Overweight/obesity related to history of excess calories and physical inactivity as evidenced by patient current BMI of 46.6, following dietary guidelines for continued weight loss prior to bariatric surgery.              Intervention:   . Nutrition  counseling for weight loss prior to bariatric surgery.  Reviewed progress since previous visit, and effects of depression on eating and activity; advised consultation with MD and/or psychologist (needs pre-op consult) to discuss current depressed state.   Discussed overview of pre-op diet to implement 2 weeks prior to surgery.   Discussed basics of vitamin and mineral supplementation after surgery.   Patient has family support for weight loss surgery and lifestyle changes; she voices understanding of dietary and other changes required after surgery, and is ready to proceed with the bariatric surgery program.   Teaching Method Utilized:  Visual Auditory Hands on  Handouts given during visit include:  Goals and instructions   Barriers to learning/adherence to lifestyle change: none  Demonstrated degree of understanding via:  Teach Back   Monitoring/Evaluation:   Dietary intake, exercise, and body weight  scheduled for pre-op class 03/28/19.

## 2019-03-28 ENCOUNTER — Other Ambulatory Visit: Payer: Self-pay

## 2019-03-28 ENCOUNTER — Encounter: Payer: Medicare HMO | Admitting: Dietician

## 2019-03-28 DIAGNOSIS — E119 Type 2 diabetes mellitus without complications: Secondary | ICD-10-CM | POA: Diagnosis not present

## 2019-03-28 DIAGNOSIS — Z713 Dietary counseling and surveillance: Secondary | ICD-10-CM | POA: Diagnosis not present

## 2019-03-28 DIAGNOSIS — Z6841 Body Mass Index (BMI) 40.0 and over, adult: Secondary | ICD-10-CM | POA: Diagnosis not present

## 2019-03-28 DIAGNOSIS — E785 Hyperlipidemia, unspecified: Secondary | ICD-10-CM | POA: Diagnosis not present

## 2019-03-28 NOTE — Progress Notes (Signed)
Pre-Operative Nutrition Class:  Appt start time: 0900  End time:  1030.  Patient was seen on 03/28/19 for Pre-Operative Bariatric Surgery Education at the [facility] at [facility].   Surgery date: TBD Surgery type: Sleeve Gastrectomy Start weight at [facility]: 254.9lbs Weight today: 253.5lbs  InBody  BODY COMP RESULTS    BMI (kg/m^2) 46.4  Fat Mass (lbs) 132.0  Fat Free Mass (lbs) 67.2  Total Body Water (lbs) 90.0   Samples given per MNT protocol. Patient educated on appropriate usage:  Celebrate Vitamins Multivitamin   Lot # [id number]   Exp: 11/2019; lot # [id number]   Exp: 10/2019  Celebrate Vitamins Calcium Citrate   Lot # [id number]    Exp: 07/2019  Renee Pain Protein Powder    Lot # [id number]   Exp: 03/2019;   Lot # [id number]   Exp: 06/2019;    Lot# [id number]   Exp: 06/2019   The following the learning objectives were met by the patient during this course:  Identify Pre-Op Dietary Goals and will begin 2 weeks pre-operatively  Identify appropriate sources of fluids and proteins   State protein recommendations and appropriate sources pre and post-operatively  Identify Post-Operative Dietary Goals and will follow for 2 weeks post-operatively  Identify appropriate multivitamin and calcium sources  Describe the need for physical activity post-operatively and will follow MD recommendations  State when to call healthcare provider regarding medication questions or post-operative complications  Handouts given during class include:  Pre-Op Bariatric Surgery Diet Handout  Protein Shake Handout  Post-Op Bariatric Surgery Nutrition Handout  BELT Program Information Flyer  Support Group Information Flyer  [facility] Bariatric Supplements Price List  Follow-Up Plan: Patient will follow-up at [employer], at about 2 weeks post operatively for diet advancement per MD.

## 2019-04-01 ENCOUNTER — Other Ambulatory Visit: Payer: Self-pay | Admitting: Family Medicine

## 2019-04-01 DIAGNOSIS — J4541 Moderate persistent asthma with (acute) exacerbation: Secondary | ICD-10-CM

## 2019-04-01 DIAGNOSIS — E039 Hypothyroidism, unspecified: Secondary | ICD-10-CM

## 2019-04-01 NOTE — Telephone Encounter (Signed)
Refill request for thyroid medication. Levothyroxine, Singular and lasix   Last visit: 12/16/2018   Lab Results  Component Value Date   TSH 1.65 05/01/2018     Follow up on 05/07/2019

## 2019-04-02 ENCOUNTER — Inpatient Hospital Stay: Payer: Medicare HMO

## 2019-04-02 ENCOUNTER — Inpatient Hospital Stay
Admission: EM | Admit: 2019-04-02 | Discharge: 2019-04-04 | DRG: 556 | Disposition: A | Discharge status: Home Health | Payer: Medicare HMO | Attending: Internal Medicine | Admitting: Internal Medicine

## 2019-04-02 ENCOUNTER — Other Ambulatory Visit: Payer: Self-pay

## 2019-04-02 ENCOUNTER — Encounter: Payer: Self-pay | Admitting: Intensive Care

## 2019-04-02 ENCOUNTER — Ambulatory Visit: Payer: Self-pay

## 2019-04-02 ENCOUNTER — Emergency Department: Payer: Medicare HMO

## 2019-04-02 DIAGNOSIS — K449 Diaphragmatic hernia without obstruction or gangrene: Secondary | ICD-10-CM | POA: Diagnosis present

## 2019-04-02 DIAGNOSIS — E1142 Type 2 diabetes mellitus with diabetic polyneuropathy: Secondary | ICD-10-CM | POA: Diagnosis present

## 2019-04-02 DIAGNOSIS — E1122 Type 2 diabetes mellitus with diabetic chronic kidney disease: Secondary | ICD-10-CM | POA: Diagnosis not present

## 2019-04-02 DIAGNOSIS — Z79890 Hormone replacement therapy (postmenopausal): Secondary | ICD-10-CM | POA: Diagnosis not present

## 2019-04-02 DIAGNOSIS — G4733 Obstructive sleep apnea (adult) (pediatric): Secondary | ICD-10-CM | POA: Diagnosis present

## 2019-04-02 DIAGNOSIS — E039 Hypothyroidism, unspecified: Secondary | ICD-10-CM | POA: Diagnosis present

## 2019-04-02 DIAGNOSIS — Z794 Long term (current) use of insulin: Secondary | ICD-10-CM

## 2019-04-02 DIAGNOSIS — Z7982 Long term (current) use of aspirin: Secondary | ICD-10-CM | POA: Diagnosis not present

## 2019-04-02 DIAGNOSIS — R29704 NIHSS score 4: Secondary | ICD-10-CM | POA: Diagnosis present

## 2019-04-02 DIAGNOSIS — E1169 Type 2 diabetes mellitus with other specified complication: Secondary | ICD-10-CM | POA: Diagnosis present

## 2019-04-02 DIAGNOSIS — H9190 Unspecified hearing loss, unspecified ear: Secondary | ICD-10-CM | POA: Diagnosis present

## 2019-04-02 DIAGNOSIS — F419 Anxiety disorder, unspecified: Secondary | ICD-10-CM | POA: Diagnosis present

## 2019-04-02 DIAGNOSIS — R531 Weakness: Secondary | ICD-10-CM

## 2019-04-02 DIAGNOSIS — G609 Hereditary and idiopathic neuropathy, unspecified: Secondary | ICD-10-CM | POA: Diagnosis present

## 2019-04-02 DIAGNOSIS — Z716 Tobacco abuse counseling: Secondary | ICD-10-CM | POA: Diagnosis not present

## 2019-04-02 DIAGNOSIS — M6281 Muscle weakness (generalized): Principal | ICD-10-CM | POA: Diagnosis present

## 2019-04-02 DIAGNOSIS — Z8249 Family history of ischemic heart disease and other diseases of the circulatory system: Secondary | ICD-10-CM | POA: Diagnosis not present

## 2019-04-02 DIAGNOSIS — Z79899 Other long term (current) drug therapy: Secondary | ICD-10-CM

## 2019-04-02 DIAGNOSIS — N189 Chronic kidney disease, unspecified: Secondary | ICD-10-CM | POA: Diagnosis not present

## 2019-04-02 DIAGNOSIS — E1151 Type 2 diabetes mellitus with diabetic peripheral angiopathy without gangrene: Secondary | ICD-10-CM | POA: Diagnosis not present

## 2019-04-02 DIAGNOSIS — Z1159 Encounter for screening for other viral diseases: Secondary | ICD-10-CM

## 2019-04-02 DIAGNOSIS — Z885 Allergy status to narcotic agent status: Secondary | ICD-10-CM

## 2019-04-02 DIAGNOSIS — M1990 Unspecified osteoarthritis, unspecified site: Secondary | ICD-10-CM | POA: Diagnosis present

## 2019-04-02 DIAGNOSIS — I639 Cerebral infarction, unspecified: Secondary | ICD-10-CM | POA: Diagnosis not present

## 2019-04-02 DIAGNOSIS — Z833 Family history of diabetes mellitus: Secondary | ICD-10-CM | POA: Diagnosis not present

## 2019-04-02 DIAGNOSIS — R29898 Other symptoms and signs involving the musculoskeletal system: Secondary | ICD-10-CM | POA: Diagnosis not present

## 2019-04-02 DIAGNOSIS — K219 Gastro-esophageal reflux disease without esophagitis: Secondary | ICD-10-CM | POA: Diagnosis not present

## 2019-04-02 DIAGNOSIS — I6523 Occlusion and stenosis of bilateral carotid arteries: Secondary | ICD-10-CM | POA: Diagnosis not present

## 2019-04-02 DIAGNOSIS — F329 Major depressive disorder, single episode, unspecified: Secondary | ICD-10-CM | POA: Diagnosis not present

## 2019-04-02 DIAGNOSIS — Z20828 Contact with and (suspected) exposure to other viral communicable diseases: Secondary | ICD-10-CM | POA: Diagnosis not present

## 2019-04-02 DIAGNOSIS — Z1339 Encounter for screening examination for other mental health and behavioral disorders: Secondary | ICD-10-CM | POA: Diagnosis not present

## 2019-04-02 DIAGNOSIS — E781 Pure hyperglyceridemia: Secondary | ICD-10-CM | POA: Diagnosis present

## 2019-04-02 DIAGNOSIS — E785 Hyperlipidemia, unspecified: Secondary | ICD-10-CM | POA: Diagnosis not present

## 2019-04-02 DIAGNOSIS — I129 Hypertensive chronic kidney disease with stage 1 through stage 4 chronic kidney disease, or unspecified chronic kidney disease: Secondary | ICD-10-CM | POA: Diagnosis not present

## 2019-04-02 DIAGNOSIS — Z66 Do not resuscitate: Secondary | ICD-10-CM | POA: Diagnosis not present

## 2019-04-02 DIAGNOSIS — Z8659 Personal history of other mental and behavioral disorders: Secondary | ICD-10-CM | POA: Diagnosis not present

## 2019-04-02 DIAGNOSIS — R2990 Unspecified symptoms and signs involving the nervous system: Secondary | ICD-10-CM | POA: Diagnosis not present

## 2019-04-02 DIAGNOSIS — R29818 Other symptoms and signs involving the nervous system: Secondary | ICD-10-CM | POA: Diagnosis not present

## 2019-04-02 DIAGNOSIS — R51 Headache: Secondary | ICD-10-CM | POA: Diagnosis not present

## 2019-04-02 DIAGNOSIS — R200 Anesthesia of skin: Secondary | ICD-10-CM | POA: Diagnosis not present

## 2019-04-02 DIAGNOSIS — Z888 Allergy status to other drugs, medicaments and biological substances status: Secondary | ICD-10-CM

## 2019-04-02 DIAGNOSIS — F331 Major depressive disorder, recurrent, moderate: Secondary | ICD-10-CM | POA: Diagnosis not present

## 2019-04-02 DIAGNOSIS — Z91040 Latex allergy status: Secondary | ICD-10-CM

## 2019-04-02 DIAGNOSIS — E119 Type 2 diabetes mellitus without complications: Secondary | ICD-10-CM | POA: Diagnosis not present

## 2019-04-02 DIAGNOSIS — I10 Essential (primary) hypertension: Secondary | ICD-10-CM | POA: Diagnosis not present

## 2019-04-02 DIAGNOSIS — Z9282 Status post administration of tPA (rtPA) in a different facility within the last 24 hours prior to admission to current facility: Secondary | ICD-10-CM | POA: Diagnosis not present

## 2019-04-02 LAB — LIPID PANEL
Cholesterol: 152 mg/dL (ref 0–200)
HDL: 32 mg/dL — ABNORMAL LOW (ref >40)
LDL Cholesterol: 80 mg/dL (ref 0–99)
Total CHOL/HDL Ratio: 4.8 RATIO
Triglycerides: 201 mg/dL — ABNORMAL HIGH (ref <150)
VLDL: 40 mg/dL (ref 0–40)

## 2019-04-02 LAB — APTT: aPTT: 27 seconds (ref 24–36)

## 2019-04-02 LAB — CBC
HCT: 35.3 % — ABNORMAL LOW (ref 36.0–46.0)
Hemoglobin: 11.8 g/dL — ABNORMAL LOW (ref 12.0–15.0)
MCH: 29.3 pg (ref 26.0–34.0)
MCHC: 33.4 g/dL (ref 30.0–36.0)
MCV: 87.6 fL (ref 80.0–100.0)
Platelets: 254 10*3/uL (ref 150–400)
RBC: 4.03 MIL/uL (ref 3.87–5.11)
RDW: 15.6 % — ABNORMAL HIGH (ref 11.5–15.5)
WBC: 9.2 10*3/uL (ref 4.0–10.5)
nRBC: 0 % (ref 0.0–0.2)

## 2019-04-02 LAB — GLUCOSE, CAPILLARY: Glucose-Capillary: 112 mg/dL — ABNORMAL HIGH (ref 70–99)

## 2019-04-02 LAB — COMPREHENSIVE METABOLIC PANEL
ALT: 32 U/L (ref 0–44)
AST: 29 U/L (ref 15–41)
Albumin: 4.4 g/dL (ref 3.5–5.0)
Alkaline Phosphatase: 77 U/L (ref 38–126)
Anion gap: 14 (ref 5–15)
BUN: 45 mg/dL — ABNORMAL HIGH (ref 8–23)
CO2: 24 mmol/L (ref 22–32)
Calcium: 9.6 mg/dL (ref 8.9–10.3)
Chloride: 103 mmol/L (ref 98–111)
Creatinine, Ser: 1.35 mg/dL — ABNORMAL HIGH (ref 0.44–1.00)
GFR calc Af Amer: 46 mL/min — ABNORMAL LOW (ref >60)
GFR calc non Af Amer: 40 mL/min — ABNORMAL LOW (ref >60)
Glucose, Bld: 109 mg/dL — ABNORMAL HIGH (ref 70–99)
Potassium: 4.2 mmol/L (ref 3.5–5.1)
Sodium: 141 mmol/L (ref 135–145)
Total Bilirubin: 0.6 mg/dL (ref 0.3–1.2)
Total Protein: 7.1 g/dL (ref 6.5–8.1)

## 2019-04-02 LAB — DIFFERENTIAL
Abs Immature Granulocytes: 0.09 10*3/uL — ABNORMAL HIGH (ref 0.00–0.07)
Basophils Absolute: 0.1 10*3/uL (ref 0.0–0.1)
Basophils Relative: 1 %
Eosinophils Absolute: 0.4 10*3/uL (ref 0.0–0.5)
Eosinophils Relative: 4 %
Immature Granulocytes: 1 %
Lymphocytes Relative: 24 %
Lymphs Abs: 2.2 10*3/uL (ref 0.7–4.0)
Monocytes Absolute: 1 10*3/uL (ref 0.1–1.0)
Monocytes Relative: 11 %
Neutro Abs: 5.4 10*3/uL (ref 1.7–7.7)
Neutrophils Relative %: 59 %

## 2019-04-02 LAB — PROTIME-INR
INR: 1 (ref 0.8–1.2)
Prothrombin Time: 13 seconds (ref 11.4–15.2)

## 2019-04-02 MED ORDER — ACETAMINOPHEN 650 MG RE SUPP: 650.0000 mg | RECTAL | Status: DC | PRN

## 2019-04-02 MED ORDER — SODIUM CHLORIDE 0.9 % IV SOLN
INTRAVENOUS | Status: DC
  Administered 2019-04-03: 02:00:00 via INTRAVENOUS

## 2019-04-02 MED ORDER — SODIUM CHLORIDE 0.9 % IV SOLN
50.0000 mL | Freq: Once | INTRAVENOUS | Status: AC
  Administered 2019-04-02: 50 mL via INTRAVENOUS

## 2019-04-02 MED ORDER — ACETAMINOPHEN 160 MG/5ML PO SOLN
650.0000 mg | ORAL | Status: DC | PRN
  Filled 2019-04-02: qty 20.3

## 2019-04-02 MED ORDER — SENNOSIDES-DOCUSATE SODIUM 8.6-50 MG PO TABS: 1.0000 | ORAL_TABLET | Freq: Every evening | ORAL | Status: DC | PRN

## 2019-04-02 MED ORDER — ACETAMINOPHEN 325 MG PO TABS
650.0000 mg | ORAL_TABLET | ORAL | Status: DC | PRN
  Administered 2019-04-04: 650 mg via ORAL
  Filled 2019-04-02: qty 2

## 2019-04-02 MED ORDER — STROKE: EARLY STAGES OF RECOVERY BOOK
Freq: Once | Status: AC
  Administered 2019-04-03: 03:00:00
  Filled 2019-04-02 (×2): qty 1

## 2019-04-02 MED ORDER — PANTOPRAZOLE SODIUM 40 MG IV SOLR
40.0000 mg | Freq: Every day | INTRAVENOUS | Status: DC
  Administered 2019-04-03: 40 mg via INTRAVENOUS

## 2019-04-02 MED ORDER — SODIUM CHLORIDE 0.9% FLUSH
3.0000 mL | Freq: Once | INTRAVENOUS | Status: DC
Start: 2019-04-02 — End: 2019-04-04

## 2019-04-02 MED ORDER — TRAZODONE HCL 50 MG PO TABS
50.0000 mg | ORAL_TABLET | Freq: Every evening | ORAL | Status: DC | PRN
  Administered 2019-04-03 – 2019-04-04 (×2): 50 mg via ORAL
  Filled 2019-04-02 (×2): qty 1

## 2019-04-02 MED ORDER — PREGABALIN 75 MG PO CAPS
300.0000 mg | ORAL_CAPSULE | Freq: Two times a day (BID) | ORAL | Status: DC
  Administered 2019-04-02: 300 mg via ORAL
  Filled 2019-04-02 (×2): qty 4

## 2019-04-02 MED ORDER — ALTEPLASE (STROKE) FULL DOSE INFUSION
90.0000 mg | Freq: Once | INTRAVENOUS | Status: AC
  Administered 2019-04-02: 90 mg via INTRAVENOUS

## 2019-04-02 NOTE — ED Notes (Signed)
Admitting MD at bedside.

## 2019-04-02 NOTE — ED Triage Notes (Signed)
Patient reports between 3:30pm-4:00pm today she started feeling numbness on left side of her body and numbness to tongue. Left grip weak. Right grip strong. Speech clear. Patient able to move from wheelchair to bed with no problems

## 2019-04-02 NOTE — Progress Notes (Signed)
   04/02/19 1800  Clinical Encounter Type  Visited With Other (Comment)  Visit Type Initial  Referral From Nurse  Consult/Referral To Chaplain  Chaplain received page for code stroke. Chaplain arrived and nurse was assessing patient. Chaplain will follow up if nurse pages her to returned.

## 2019-04-02 NOTE — Consult Note (Signed)
Name: Summer [name] MRN: [id number] DOB: Aug 26, 1951    ADMISSION DATE:  04/02/2019 CONSULTATION DATE: 04/02/2019  REFERRING MD : Rufina Falco, NP   CHIEF COMPLAINT: Left-sided numbness   BRIEF PATIENT DESCRIPTION:  Pt admitted admitted with left sided hemiparesis and numbness suspected secondary to acute CVA s/p tPA   SIGNIFICANT EVENTS/STUDIES:  07/22-Pt admitted to the stepdown unit s/p TPA   HISTORY OF PRESENT ILLNESS:   This is a 68 yo female with a PMH of OSA, Hypothyroidism, Allergic Rhinitis, Obesity, Memory Loss, Insomnia, HTN, Hyperlipidemia, Hiatal Hernia, Headache, GERD, Type II Diabetes Mellitus, Depressive Disorder, CKD, Asthma, Arthritis, and Anemia.  She presented to [facility] ER on 07/22 with c/o weakness and numbness of the LUE, LLL, and left face onset of symptoms 1530 on 07/22.  She also endorsed having a headache before symptoms started.  In the ER pt alert and oriented initial NIH stroke scale 4 per nursing assessment, therefore code stroke initiated and pt evaluated by tele-neurology.  CT Head revealed no acute abnormality ASPECTS 10.  Per tele-neurology recommendations pt received tPA.  She was subsequently admitted to the stepdown unit by hospitalist team for additional workup and treatment.    PAST MEDICAL HISTORY :   has a past medical history of Anemia, Arthritis, Asthma, Carpal tunnel syndrome, Chronic kidney disease, Depressive disorder, Diabetes mellitus, Dyspnea, GERD (gastroesophageal reflux disease), Headache, History of hiatal hernia, Hyperlipidemia, Hypertension, Insomnia, Lumbago, Memory loss, Obesity, 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), Pneumonia, Rhinitis, allergic, Rosacea, Thyroid disease, Unspecified hearing loss, Unspecified hereditary and idiopathic peripheral neuropathy, and Unspecified sleep apnea.  has a past surgical history that includes Tubal ligation; Vaginal hysterectomy (1989); Neck surgery; Cataract extraction (01/2011); eye lid surgery (2013); Foot  surgery; and Ankle surgery (Left, approx Jan 2018). Prior to Admission medications   Medication Sig Start Date End Date Taking? Authorizing Provider  acetaminophen (TYLENOL 8 HOUR) 650 MG CR tablet Take 1 tablet (650 mg total) by mouth every 8 (eight) hours as needed for pain. 10/16/18  Yes Sowles, Drue Stager, MD  amLODipine (NORVASC) 2.5 MG tablet Take 2.5 mg by mouth daily.  12/03/18  Yes [provider]  ASPIRIN LOW DOSE 81 MG EC tablet TAKE 1 TABLET (81 MG TOTAL) BY MOUTH DAILY. 05/31/16  Yes Sowles, Drue Stager, MD  Cyanocobalamin (VITAMIN B12 PO) Take 1 tablet by mouth daily.    Yes [provider]  DULoxetine (CYMBALTA) 60 MG capsule Take 1 capsule (60 mg total) by mouth daily. 02/05/19  Yes Sowles, Drue Stager, MD  furosemide (LASIX) 20 MG tablet Take 1 tablet (20 mg total) by mouth daily. 04/01/19  Yes Sowles, Drue Stager, MD  Icosapent Ethyl (VASCEPA) 1 g CAPS Take 2 capsules (2 g total) by mouth 2 (two) times daily. 12/02/18  Yes Sowles, Drue Stager, MD  insulin NPH-regular Human (70-30) 100 UNIT/ML injection Inject 100 Units into the skin 2 (two) times daily with a meal.   Yes Lonia Farber, MD  irbesartan-hydrochlorothiazide (AVALIDE) 300-12.5 MG tablet Take 1 tablet by mouth daily. 03/07/19  Yes Steele Sizer, MD  levothyroxine (SYNTHROID) 25 MCG tablet One daily and two on sundays Patient taking differently: Take 25-50 mcg by mouth daily before breakfast. One tablet daily and two tablets on Sundays 04/01/19  Yes Sowles, Drue Stager, MD  metFORMIN (GLUCOPHAGE-XR) 500 MG 24 hr tablet Take 500 mg by mouth. 2 tablets daily (total of 1000 mg daily) 12/03/17  Yes [provider]  metoprolol tartrate (LOPRESSOR) 25 MG tablet Take 1 tablet (25 mg total) by  mouth 2 (two) times daily. 01/06/19  Yes Sowles, Drue Stager, MD  Misc Natural Products (OSTEO BI-FLEX ADV JOINT SHIELD PO) Take by mouth 2 (two) times a week.   Yes [provider]  montelukast (SINGULAIR) 10 MG tablet Take 1 tablet  (10 mg total) by mouth at bedtime. 04/01/19  Yes Sowles, Drue Stager, MD  Omega-3 Fatty Acids (FISH OIL) 1000 MG CAPS Take 1,000 mg by mouth daily.    Yes [provider]  omeprazole (PRILOSEC) 40 MG capsule Take 1 capsule (40 mg total) by mouth daily. 02/05/19  Yes Steele Sizer, MD  Potassium 99 MG TABS Take by mouth.   Yes [provider]  pregabalin (LYRICA) 300 MG capsule Take 300 mg by mouth 2 (two) times a day. 03/06/19  Yes [provider]  Probiotic Product (PROBIOTIC-10 PO) Take by mouth.   Yes [provider]  rosuvastatin (CRESTOR) 20 MG tablet Take 1 tablet (20 mg total) by mouth daily. 08/27/18  Yes Sowles, Drue Stager, MD  vitamin C (ASCORBIC ACID) 500 MG tablet Take 500 mg by mouth 2 (two) times daily.    Yes [provider]  VITAMIN D, CHOLECALCIFEROL, PO Take 1,000 Units by mouth daily.    Yes [provider]  ACCU-CHEK SOFTCLIX LANCETS lancets  08/26/17   [provider]  albuterol (PROVENTIL HFA) 108 (90 Base) MCG/ACT inhaler Inhale 2 puffs into the lungs 2 (two) times daily as needed for shortness of breath.     [provider]  Alcohol Swabs (B-D SINGLE USE SWABS REGULAR) PADS  01/04/18   [provider]  Blood Glucose Monitoring Suppl (ACCU-CHEK AVIVA PLUS) w/Device KIT  11/02/15   [provider]  [place] 1ML/31G 31G X 5/16" 1 ML MISC  12/19/15   [provider]   Allergies  Allergen Reactions  . Codeine Other (See Comments)    "TRIPPED OUT"  DIDN'T LIKE THE [place]  . Atorvastatin     muscle pain  . Hydrocodone     itching  . Latex Rash  . Zolpidem Other (See Comments)    Sleep walk    FAMILY HISTORY:  family history includes Aneurysm in her mother; Diabetes in her maternal grandfather; Heart attack in her maternal grandfather and mother. SOCIAL HISTORY:  reports that she has never smoked. She has never used smokeless tobacco. She reports that she does not  drink alcohol or use drugs.  REVIEW OF SYSTEMS: Positives in BOLD  Constitutional: Negative for fever, chills, weight loss, malaise/fatigue and diaphoresis.  HENT: Negative for hearing loss, ear pain, nosebleeds, congestion, sore throat, neck pain, tinnitus and ear discharge.   Eyes: Negative for blurred vision, double vision, photophobia, pain, discharge and redness.  Respiratory: Negative for cough, hemoptysis, sputum production, shortness of breath, wheezing and stridor.   Cardiovascular: Negative for chest pain, palpitations, orthopnea, claudication, leg swelling and PND.  Gastrointestinal: Negative for heartburn, nausea, vomiting, abdominal pain, diarrhea, constipation, blood in stool and melena.  Genitourinary: Negative for dysuria, urgency, frequency, hematuria and flank pain.  Musculoskeletal: Negative for myalgias, back pain, joint pain and falls.  Skin: Negative for itching and rash.  Neurological: LLE and LUE weakness and numbness, left sided facial numbness, dizziness, tingling, tremors, sensory change, speech change, focal weakness, seizures, loss of consciousness, weakness and headaches.  Endo/Heme/Allergies: Negative for environmental allergies and polydipsia. Does not bruise/bleed easily.  SUBJECTIVE:  c/o mild left facial numbness   VITAL SIGNS: Temp:  [98.2 F (36.8 C)] 98.2  F (36.8 C) (07/22 1737) Pulse Rate:  [64-80] 73 (07/22 2100) Resp:  [12-21] 15 (07/22 2100) BP: (112-145)/(54-72) 131/70 (07/22 2100) SpO2:  [94 %-100 %] 96 % (07/22 2100) Weight:  [111.3 kg] 111.3 kg (07/22 1741)  PHYSICAL EXAMINATION: General: well developed, well nourished female, NAD  Neuro: alert and oriented, PERRLA, BLE and BLU motor strength 5/5, mild LLE drift, mild left sided facial numbness  HEENT: supple, no JVD  Cardiovascular: nsr, rrr, no R/G Lungs: clear throughout, even, non labored  Abdomen: +BS x4, soft, obese, non tender, non distended  Musculoskeletal: normal bulk and  tone, bilateral lower extremity chronic peripheral neuropathy  Skin: intact no rashes or lesions present   Recent Labs  Lab 04/02/19 1743  NA 141  K 4.2  CL 103  CO2 24  BUN 45*  CREATININE 1.35*  GLUCOSE 109*   Recent Labs  Lab 04/02/19 1743  HGB 11.8*  HCT 35.3*  WBC 9.2  PLT 254   Ct Head Code Stroke Wo Contrast  Result Date: 04/02/2019 CLINICAL DATA:  Code stroke. Numbness and tingling, paresthesia. Left-sided weakness EXAM: CT HEAD WITHOUT CONTRAST TECHNIQUE: Contiguous axial images were obtained from the base of the skull through the vertex without intravenous contrast. COMPARISON:  None. FINDINGS: Brain: No evidence of acute infarction, hemorrhage, hydrocephalus, extra-axial collection or mass lesion/mass effect. Vascular: Negative for hyperdense vessel Skull: Negative Sinuses/Orbits: Negative Other: None ASPECTS ([place] Stroke Program Early CT Score) - Ganglionic level infarction (caudate, lentiform nuclei, internal capsule, insula, M1-M3 cortex): 7 - Supraganglionic infarction (M4-M6 cortex): 3 Total score (0-10 with 10 being normal): 10 IMPRESSION: 1. No acute abnormality 2. ASPECTS is 10 3. These results were called by telephone at the time of interpretation on 04/02/2019 at 5:39 pm to Dr. Nance Pear , who verbally acknowledged these results. Electronically Signed   By: Franchot Gallo M.D.   On: 04/02/2019 17:40    ASSESSMENT / PLAN:  Left sided hemiparesis and numbness secondary to suspected CVA s/p tPA  Hx: Hyperlipidemia, Obesity, and HTN  Continuous telemetry monitoring  Supplemental O2 for dyspnea and/or hypoxia  US Carotid Bilateral and Echo pending  Lipid panel and hemoglobin A1c pending Repeat CT Head 24hrs post tPA  MRI Brain in the am  Assess NIH stroke scale per protocol  Allow for permissive hypertension goal sbp 170-180's Continue outpatient rosuvastatin  VTE px: SCD's  Trend CBC  Monitor for s/sx of bleeding  Speech/PT/OT evaluations pending   Neurology consulted appreciated input   Chronic renal failure  Trend BMP  Replace electrolytes as indicated  Monitor UOP  Avoid nephrotoxic medications   Type II Diabetes Mellitus CBG's q4hrs  SSI   Hypothyroidism Continue outpatient synthroid   GERD  Continue protonix    -Will maintain droplet and contact precautions pts NOVEL Coronavirus test performed as a send out prior to tPA administration.  Unable to obtain rapid Novel Coronavirus sample due to increased risk of bleeding secondary to tPA administration   Marda Stalker, [place] Pager [phone] (please enter 7 digits) PCCM Consult Pager [phone] (please enter 7 digits)

## 2019-04-02 NOTE — ED Notes (Signed)
Neurologist Katina Degree on tele at this time

## 2019-04-02 NOTE — H&P (Addendum)
agree with detailed note below. Patient presentation and plan discussed on rounds with APP.    [facility] NAME: Summer [name]    MR#:  [id number]  DATE OF BIRTH:  [birth year]  DATE OF ADMISSION:  04/02/2019  PRIMARY CARE PHYSICIAN: Steele Sizer, MD   REQUESTING/REFERRING PHYSICIAN: Nance Pear, MD  CHIEF COMPLAINT:   Chief Complaint  Patient presents with  . Numbness  . Chest Pain    HISTORY OF PRESENT ILLNESS:  68 y.o. female with pertinent past medical history of hypertension, hyperlipidemia, hypothyroidism, type 2 diabetes mellitus with diabetic polyneuropathy, with long-term current use of insulin, chocolate arthropathy, and CKD presenting to the ED with chief complaints of left sided and facial numbness since 3:30 pm today.  Patient reports sudden onset of symptoms which she describes as left arm and leg eakness, numbness, tingling, pins-and-needles sensation) and a heavy feeling in her left extremity. Also report left facial and lip numbness " as if I just came back from the dentist". Denies associated altered sensorium, speech abnormality, cranial nerve deficit, seizures, diplopia, nausea or vomiting, syncope or LOC, dizziness or headache.  On arrival to the ED, she was afebrile with blood pressure 137/72 mm Hg and pulse rate 64 beats/min. She was noted with left sided weakness; she was alert and oriented x4, and he did not demonstrate any memory deficits.  Initial NIH stroke scale 4 per nursing assessment..  Code stroke was initiated and patient evaluated by a tele-neurologist.  A noncontrast CT head was obtained and showed no acute intracranial abnormality.  Due to persistent symptoms and patient being within the window period for intervention patient was deemed a candidate for IV alteplase which was administered at 1755.  Initial labs revealed blood glucose 112, decreased kidney functions at baseline, unremarkable CBC.   Patient will be admitted to stepdown/ICU for further stroke work-up and close monitoring.  PAST MEDICAL HISTORY:   Past Medical History:  Diagnosis Date  . Anemia   . Arthritis   . Asthma   . Carpal tunnel syndrome   . Chronic kidney disease   . Depressive disorder   . Diabetes mellitus   . Dyspnea   . GERD (gastroesophageal reflux disease)   . Headache   . History of hiatal hernia   . Hyperlipidemia   . Hypertension   . Insomnia   . Lumbago   . Memory loss   . Obesity   . 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)   . Pneumonia   . Rhinitis, allergic   . Rosacea   . Thyroid disease   . Unspecified hearing loss   . Unspecified hereditary and idiopathic peripheral neuropathy   . Unspecified sleep apnea     PAST SURGICAL HISTORY:   Past Surgical History:  Procedure Laterality Date  . ANKLE SURGERY Left approx Jan 2018  . CATARACT EXTRACTION  01/2011   right  . eye lid surgery  2013   bilateral  . FOOT SURGERY    . NECK SURGERY    . TUBAL LIGATION    . VAGINAL HYSTERECTOMY  1989    SOCIAL HISTORY:   Social History   Tobacco Use  . Smoking status: Never Smoker  . Smokeless tobacco: Never Used  Substance Use Topics  . Alcohol use: No    Alcohol/week: 0.0 standard drinks    Frequency: Never    FAMILY HISTORY:   Family History  Problem Relation Age of Onset  . Heart attack Mother   .  Aneurysm Mother   . Heart attack Maternal Grandfather   . Diabetes Maternal Grandfather     DRUG ALLERGIES:   Allergies  Allergen Reactions  . Codeine Other (See Comments)    "TRIPPED OUT"  DIDN'T LIKE THE [place]  . Atorvastatin     muscle pain  . Hydrocodone     itching  . Latex Rash  . Zolpidem Other (See Comments)    Sleep walk    REVIEW OF SYSTEMS:   Review of Systems  Constitutional: Negative for chills, fever, malaise/fatigue and weight loss.  HENT: Negative for congestion, hearing loss and sore throat.   Eyes: Negative for blurred vision and double vision.   Respiratory: Negative for cough, shortness of breath and wheezing.   Cardiovascular: Negative for chest pain, palpitations, orthopnea and leg swelling.  Gastrointestinal: Negative for abdominal pain, diarrhea, nausea and vomiting.  Genitourinary: Negative for dysuria and urgency.  Musculoskeletal: Negative for myalgias.  Skin: Negative for rash.  Neurological: Positive for sensory change and focal weakness. Negative for dizziness, speech change and headaches.       Left-sided numbness and weakness  Psychiatric/Behavioral: Positive for depression and memory loss. The patient has insomnia.     MEDICATIONS AT HOME:   Prior to Admission medications   Medication Sig Start Date End Date Taking? Authorizing Provider  ACCU-CHEK SOFTCLIX LANCETS lancets  08/26/17   [provider]  acetaminophen (TYLENOL 8 HOUR) 650 MG CR tablet Take 1 tablet (650 mg total) by mouth every 8 (eight) hours as needed for pain. 10/16/18   Steele Sizer, MD  albuterol (PROVENTIL HFA) 108 (90 Base) MCG/ACT inhaler Inhale 2 puffs into the lungs 2 (two) times daily as needed for shortness of breath.     [provider]  Alcohol Swabs (B-D SINGLE USE SWABS REGULAR) PADS  01/04/18   [provider]  amLODipine (NORVASC) 5 MG tablet  12/03/18   [provider]  ASPIRIN LOW DOSE 81 MG EC tablet TAKE 1 TABLET (81 MG TOTAL) BY MOUTH DAILY. 05/31/16   Steele Sizer, MD  Blood Glucose Monitoring Suppl (ACCU-CHEK AVIVA PLUS) w/Device KIT  11/02/15   [provider]  Cyanocobalamin (VITAMIN B12 PO) Take by mouth.    [provider]  DULoxetine (CYMBALTA) 60 MG capsule Take 1 capsule (60 mg total) by mouth daily. 02/05/19   Steele Sizer, MD  furosemide (LASIX) 20 MG tablet Take 1 tablet (20 mg total) by mouth daily. 04/01/19   Steele Sizer, MD  Icosapent Ethyl (VASCEPA) 1 g CAPS Take 2 capsules (2 g total) by mouth 2 (two) times daily. 12/02/18   Steele Sizer, MD  insulin  NPH-regular Human (70-30) 100 UNIT/ML injection Inject 100 Units into the skin 2 (two) times daily with a meal.    Lonia Farber, MD  irbesartan-hydrochlorothiazide (AVALIDE) 300-12.5 MG tablet Take 1 tablet by mouth daily. 03/07/19   Steele Sizer, MD  levothyroxine (SYNTHROID) 25 MCG tablet One daily and two on sundays 04/01/19   Steele Sizer, MD  metFORMIN (GLUCOPHAGE-XR) 500 MG 24 hr tablet Take 500 mg by mouth. 2 tablets daily (total of 1000 mg daily) 12/03/17   [provider]  metoprolol tartrate (LOPRESSOR) 25 MG tablet Take 1 tablet (25 mg total) by mouth 2 (two) times daily. 01/06/19   Steele Sizer, MD  Misc Natural Products (OSTEO BI-FLEX ADV JOINT SHIELD PO) Take by mouth 2 (two) times a week.    [provider]  montelukast (SINGULAIR) 10  MG tablet Take 1 tablet (10 mg total) by mouth at bedtime. 04/01/19   Steele Sizer, MD  Omega-3 Fatty Acids (FISH OIL) 1000 MG CAPS Take by mouth.    [provider]  omeprazole (PRILOSEC) 40 MG capsule Take 1 capsule (40 mg total) by mouth daily. 02/05/19   Steele Sizer, MD  Potassium 99 MG TABS Take by mouth.    [provider]  pregabalin (LYRICA) 300 MG capsule Take 2 capsules (600 mg total) by mouth daily. Patient taking differently: Take 600 mg by mouth daily.  11/28/17   Steele Sizer, MD  Probiotic Product (PROBIOTIC-10 PO) Take by mouth.    [provider]  [place] 1ML/31G 31G X 5/16" 1 ML MISC  12/19/15   [provider]  rosuvastatin (CRESTOR) 20 MG tablet Take 1 tablet (20 mg total) by mouth daily. 08/27/18   Steele Sizer, MD  vitamin C (ASCORBIC ACID) 500 MG tablet Take 500 mg by mouth 2 (two) times daily.     [provider]  VITAMIN D, CHOLECALCIFEROL, PO Take 1,000 Units by mouth daily.     [provider]      VITAL SIGNS:  Blood pressure (!) 123/55, pulse 74, temperature 98.2 F (36.8 C), temperature source Oral, resp. rate 20,  weight 111.3 kg, SpO2 97 %.  PHYSICAL EXAMINATION:   Physical Exam  GENERAL:  68 y.o.-year-old patient lying in the bed with no acute distress.  EYES: Pupils equal, round, reactive to light and accommodation. No scleral icterus. Extraocular muscles intact.  HEENT: Head atraumatic, normocephalic. Oropharynx and nasopharynx clear.  NECK:  Supple, no jugular venous distention. No thyroid enlargement, no tenderness.  LUNGS: Normal breath sounds bilaterally, no wheezing, rales,rhonchi or crepitation. No use of accessory muscles of respiration.  CARDIOVASCULAR: S1, S2 normal. No murmurs, rubs, or gallops.  ABDOMEN: Soft, nontender, nondistended. Bowel sounds present. No organomegaly or mass.  EXTREMITIES: No pedal edema, cyanosis, or clubbing. No rash or lesions. + pedal pulses MUSCULOSKELETAL: Normal bulk, and power was 5+ grip and elbow, knee, and ankle flexion and extension bilaterally.  NEUROLOGIC: Alert and oriented x 4.  Speech mildly dysarthric without evidence of aphasia.  Able to follow 3 step commands without difficulty.  Cranial Nerves: II: Discs flat bilaterally; Visual fields grossly normal, pupils equal, round, reactive to light and accommodation III,IV, VI: ptosis not present, extra-ocular motions intact bilaterally V,VII: smile symmetric, facial light touch sensation decreased on the left VIII: hearing normal bilaterally IX,X: gag reflex present XI: bilateral shoulder shrug XII: midline tongue extension Motor: Right :  Upper extremity   5/5 Without pronator drift      Left: Upper extremity   4/5 without pronator drift Right:   Lower extremity   5/5                                          Left: Lower extremity   4/5 Sensation to light touch and cold stimuli decreased on the left. Finger to nose nl. Babinski is downgoing. DTR's (biceps, patellar, and achilles) 2+ and symmetric throughout. Gait not tested due to safety concern. PSYCHIATRIC: The patient is alert and oriented x 3.   SKIN: No obvious rash, lesion, or ulcer.   DATA REVIEWED:  LABORATORY PANEL:   CBC Recent Labs  Lab 04/02/19 1743  WBC 9.2  HGB 11.8*  HCT 35.3*  PLT 254   ------------------------------------------------------------------------------------------------------------------  Chemistries  Recent Labs  Lab 04/02/19 1743  NA 141  K 4.2  CL 103  CO2 24  GLUCOSE 109*  BUN 45*  CREATININE 1.35*  CALCIUM 9.6  AST 29  ALT 32  ALKPHOS 77  BILITOT 0.6   ------------------------------------------------------------------------------------------------------------------  Cardiac Enzymes No results for input(s): TROPONINI in the last 168 hours. ------------------------------------------------------------------------------------------------------------------  RADIOLOGY:  Ct Head Code Stroke Wo Contrast  Result Date: 04/02/2019 CLINICAL DATA:  Code stroke. Numbness and tingling, paresthesia. Left-sided weakness EXAM: CT HEAD WITHOUT CONTRAST TECHNIQUE: Contiguous axial images were obtained from the base of the skull through the vertex without intravenous contrast. COMPARISON:  None. FINDINGS: Brain: No evidence of acute infarction, hemorrhage, hydrocephalus, extra-axial collection or mass lesion/mass effect. Vascular: Negative for hyperdense vessel Skull: Negative Sinuses/Orbits: Negative Other: None ASPECTS ([place] Stroke Program Early CT Score) - Ganglionic level infarction (caudate, lentiform nuclei, internal capsule, insula, M1-M3 cortex): 7 - Supraganglionic infarction (M4-M6 cortex): 3 Total score (0-10 with 10 being normal): 10 IMPRESSION: 1. No acute abnormality 2. ASPECTS is 10 3. These results were called by telephone at the time of interpretation on 04/02/2019 at 5:39 pm to Dr. Nance Pear , who verbally acknowledged these results. Electronically Signed   By: Franchot Gallo M.D.   On: 04/02/2019 17:40    EKG:  EKG: normal EKG, normal sinus rhythm, unchanged from previous  tracings. Vent. rate 74 BPM PR interval * ms QRS duration 85 ms QT/QTc 377/419 ms P-R-T axes 89 46 34 IMPRESSION AND PLAN:   68 y.o. female with pertinent past medical history of hypertension, hyperlipidemia, hypothyroidism, type 2 diabetes mellitus with diabetic polyneuropathy, with long-term current use of insulin, chocolate arthropathy, and CKD presenting to the ED with chief complaints of left sided and facial numbness since 3:30 pm today  1.  Left upper a.d lower extremity weakness and numbness - Concerns for ischemic event in a patient with stroke risk factor and small vessel disease (DM, HTN, HLD) - Post IV TPA for Ischemic Stroke - CTA head reviewed and shows no acute intracranial abnormality  - Admit to ICU with critical care service following - Vital signs/blood pressure with neuro checks/NIHSS  per tPA protocol for the first 24 hours  - No lines, catheters, antiplatelet or anticoagulants x24 hr after IV tPA unless required  - Keep BP <185/100 with short-acting antihypertensives (Labetolol, Hydralazine or Nicardipine Gtt as needed).  - Order MRI brain without contrast per stroke protocol - Order TTE to r/o cardioembolic source, assess EF  - Check fasting Lipid Panel - Check HgA1c, TSH  - Will repeat head CT in 24 hours s/p IV Alteplase per protocol. - Place SCDs for DVT prevention. - NPO for now until passes swallow screen  - PT consult, OT consult, Speech consult - Aspiration precautions, Bleeding precautions - Obtain STAT head CT for any new acute headache or new neurological deficits - Neurology consult.   2. Diabetes Mellitus Type 2 with complications - FBPZ0C goal < 7.0 - Currently on Metformin, and insulin. Will hold as currently NPO - CBG monitoring - SSI - DM education and close PCP follow up  3. Hypertension - Goal BP following 24 hr post-tPA evaluation 160-180 - Amlodipine, Avalide, and metoprolol  4. Hyperlipidemia - Home meds: Crestor - LDL goal < 70 -  Continue Rosuvastatin 23m PO qhs  5. DVT prophylaxis - Hold Enoxaparin SubQ   6. GERD - Protonix  7.  Hypothyroidism -continue Synthroid   All the records are reviewed and  case discussed with ED provider. Management plans discussed with the patient, family and they are in agreement.  CODE STATUS: FULL  TOTAL TIME TAKING CARE OF THIS PATIENT: 50 minutes.    on 04/02/2019 at 7:02 PM  Rufina Falco, DNP, FNP-BC [facility] Nurse Practitioner Between 7am to 6pm - Pager [phone]  After 6pm go to [email or web address] - password [email or web address] [facility]  Office  [phone]  CC: Primary care physician; Steele Sizer, MD

## 2019-04-02 NOTE — Telephone Encounter (Signed)
Pt calles stating that she has had sudden tingling and weakness to the left side of her body.  She states that her lips feel numb. The symptoms started 30 minutes ago.  She has headache earlier but took medication and now no headache.  She states its is a little hard to breath. Her BP 173/47 per home device. Pt instructed to call 911. She states that she is not alone and does not need me to call for her. Note will be routed to office.  Reason for Disposition . [1] Weakness (i.e., paralysis, loss of muscle strength) of the face, arm / hand, or leg / foot on one side of the body AND [2] sudden onset AND [3] present now  Answer Assessment - Initial Assessment Questions 1. SYMPTOM: "What is the main symptom you are concerned about?" (e.g., weakness, numbness)     Tingling left side of body 2. ONSET: "When did this start?" (minutes, hours, days; while sleeping)    1/2 hour 3. LAST NORMAL: "When was the last time you were normal (no symptoms)?"    30 minutes ago 4. PATTERN "Does this come and go, or has it been constant since it started?"  "Is it present now?"    now 5. CARDIAC SYMPTOMS: "Have you had any of the following symptoms: chest pain, difficulty breathing, palpitations?"    none 6. NEUROLOGIC SYMPTOMS: "Have you had any of the following symptoms: headache, dizziness, vision loss, double vision, changes in speech, unsteady on your feet?"    Headache is gone dizzy blurred vision 7. OTHER SYMPTOMS: "Do you have any other symptoms?"     no 8. PREGNANCY: "Is there any chance you are pregnant?" "When was your last menstrual period?"    N/A  Protocols used: NEUROLOGIC DEFICIT-A-AH

## 2019-04-02 NOTE — Consult Note (Signed)
[facility] Tele[facility]urology Consult Services   Date of Service:   04/02/2019 17:20:10  Impression:     .  Rule Out Acute Ischemic Stroke     .  Right Hemispheric Infarct  Comments/Sign-Out: the patient has left-sided weakness in the arm and leg as well as left-sided numbness. Discussed the risk versus benefit of acute thrombolytic therapy with the patient and she is agreeable that she would finally symptoms disabling if they persisted and wishes to proceed with the medication which is very reasonable. Recommend admission for stroke workup and inpatient neurology consultation  Mechanism of Stroke: Small Vessel Disease  Metrics: Last Known Well: 04/02/2019 15:30:00 [facility] Notification Time: 04/02/2019 17:20:10 Arrival Time: 04/02/2019 16:31:00 Stamp Time: 04/02/2019 17:20:10 Telephone Response Time: 04/02/2019 17:25:48 Time First Login Attempt: 04/02/2019 17:25:48 Video Start Time: 04/02/2019 17:29:29  Symptoms: left sided weakness and numbness NIHSS Start Assessment Time: 04/02/2019 17:30:00 tPA Verbal Order Time: 04/02/2019 17:38:19 Patient is a candidate for tPA. tPA CPOE Order Time: 04/02/2019 17:40:03 Needle Time: 04/02/2019 17:55:10 Weight Noted by Staff: 111.3 kg Video End Time: 04/02/2019 17:57:58 Reason for tPA Delay: Activation Delay  CT head showed no acute hemorrhage or acute core infarct.  Clinical Presentation is not Suggestive of Large Vessel Occlusive Disease  Radiologist was not called back for review of advanced imaging because na ED Physician notified of diagnostic impression and management plan on 04/02/2019 17:45:39  Our recommendations are outlined below.  Recommendations:     .  Activate Stroke Protocol Admission/Order Set     .  Stroke/Telemetry Floor     .  Neuro Checks     .  Bedside Swallow Eval     .  DVT Prophylaxis     .  IV Fluids, Normal Saline     .  Head of Bed 30 Degrees     .  Euglycemia and Avoid Hyperthermia (PRN  Acetaminophen)     .  Hold Antithrombotics for Now  Routine Consultation with [facility] for Follow up Care  Sign Out:     .  Discussed with Emergency Department Provider    ------------------------------------------------------------------------------  History of Present Illness: Patient is a 67 year old Female.  Patient was brought by private transportation with symptoms of left sided weakness and numbness  She has left sided numbness and weakness that began around 3:30pm today. Has HTN, HLD,, DM, Takes asa daily. Never had these sx before.  Last seen normal was within 4.5 hours. There is no history of hemorrhagic complications or intracranial hemorrhage. There is no history of Recent Anticoagulants. There is no history of recent major surgery. There is no history of recent stroke.  Examination: BP(136/64), 1A: Level of Consciousness - Alert; keenly responsive + 0 1B: Ask Month and Age - Both Questions Right + 0 1C: Blink Eyes & Squeeze Hands - Performs Both Tasks + 0 2: Test Horizontal Extraocular Movements - Normal + 0 3: Test Visual Fields - No Visual Loss + 0 4: Test Facial Palsy (Use Grimace if Obtunded) - Normal symmetry + 0 5A: Test Left Arm Motor Drift - Drift, but doesn't hit bed + 1 5B: Test Right Arm Motor Drift - No Drift for 10 Seconds + 0 6A: Test Left Leg Motor Drift - Drift, but doesn't hit bed + 1 6B: Test Right Leg Motor Drift - No Drift for 5 Seconds + 0 7: Test Limb Ataxia (FNF/Heel-Shin) - No Ataxia + 0 8: Test Sensation - Mild-Moderate Loss: Less Sharp/More Dull + 1  9: Test Language/Aphasia - Normal; No aphasia + 0 10: Test Dysarthria - Normal + 0 11: Test Extinction/Inattention - No abnormality + 0  NIHSS Score: 3  Patient/Family was informed the Neurology Consult would happen via TeleHealth consult by way of interactive audio and video telecommunications and consented to receiving care in this manner.  Due to the immediate potential for  life-threatening deterioration due to underlying acute neurologic illness, I spent 35 minutes providing critical care. This time includes time for face to face visit via telemedicine, review of medical records, imaging studies and discussion of findings with providers, the patient and/or family.   Dr Katina Degree   [facility] [phone]

## 2019-04-02 NOTE — ED Provider Notes (Signed)
[facility] Emergency Department Provider Note   ____________________________________________   I have reviewed the triage vital signs and the nursing notes.   HISTORY  Chief Complaint Left sided weakness and numbness  History limited by: Not Limited   HPI Summer [name] is a 68 y.o. female who presents to the emergency department today because of concern for left sided weakness and numbness. She states that the symptoms started at 3 pm today. She initially had numbness over her left upper and lower extremity. At the time of my exam her numbness has improved and she says she only has some over her face. The patient also has concern for weakness to her left arm and leg. She says she was able to ambulate after her symptoms started. She denies any trauma to her head. She does state she had a headache earlier before the symptoms started.   Records reviewed. Per medical record review patient has a history of HTN, HLD  Past Medical History:  Diagnosis Date  . Anemia   . Arthritis   . Asthma   . Carpal tunnel syndrome   . Chronic kidney disease   . Depressive disorder   . Diabetes mellitus   . Dyspnea   . GERD (gastroesophageal reflux disease)   . Headache   . History of hiatal hernia   . Hyperlipidemia   . Hypertension   . Insomnia   . Lumbago   . Memory loss   . Obesity   . 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)   . Pneumonia   . Rhinitis, allergic   . Rosacea   . Thyroid disease   . Unspecified hearing loss   . Unspecified hereditary and idiopathic peripheral neuropathy   . Unspecified sleep apnea     Patient Active Problem List   Diagnosis Date Noted  . (HFpEF) heart failure with preserved ejection fraction ([place]) 08/31/2017  . Moderate persistent asthma 08/31/2017  . Hyperlipidemia due to type 2 diabetes mellitus ([place]) 06/14/2017  . Essential hypertension 10/06/2016  . Morbid obesity with BMI of 40.0-44.9, adult ([place]) 10/06/2016  . Dyslipidemia  associated with type 2 diabetes mellitus ([place]) 08/18/2016  . Charcot foot due to diabetes mellitus ([place]) 02/22/2016  . Hypertriglyceridemia 07/27/2015  . Acquired abduction deformity of foot 07/12/2015  . Osteoarthritis of subtalar joint 07/12/2015  . Poorly controlled type 2 diabetes mellitus with neuropathy ([place]) 07/12/2015  . Arthritis of foot, degenerative 07/12/2015  . Carpal tunnel syndrome 04/17/2015  . Chronic constipation 04/17/2015  . Insomnia, persistent 04/17/2015  . Chronic kidney disease (CKD), stage III (moderate) ([place]) 04/17/2015  . Decreased exercise tolerance 04/17/2015  . Diabetes mellitus with polyneuropathy ([place]) 04/17/2015  . Gastro-esophageal reflux disease without esophagitis 04/17/2015  . Bursitis, trochanteric 04/17/2015  . Cephalalgia 04/17/2015  . Benign hypertension 04/17/2015  . Adult hypothyroidism 04/17/2015  . Hearing loss 04/17/2015  . Chronic recurrent major depressive disorder ([place]) 04/17/2015  . Neurogenic claudication 04/17/2015  . Obesity (BMI 35.0-39.9 without comorbidity) 04/17/2015  . Hypo-ovarianism 04/17/2015  . Perennial allergic rhinitis with seasonal variation 04/17/2015  . Acne erythematosa 04/17/2015  . Dyskinesia, tardive 04/17/2015  . Memory loss 04/17/2015  . Impingement syndrome of shoulder 04/17/2015  . Dermatitis, stasis 04/17/2015  . Obstructive sleep apnea 05/14/2014  . Dyspnea 05/06/2014  . Hyperlipidemia 02/06/2012  . LBP (low back pain) 09/16/2008    Past Surgical History:  Procedure Laterality Date  . ANKLE SURGERY Left approx Jan 2018  . CATARACT EXTRACTION  01/2011   right  .  eye lid surgery  2013   bilateral  . FOOT SURGERY    . NECK SURGERY    . TUBAL LIGATION    . VAGINAL HYSTERECTOMY  1989    Prior to Admission medications   Medication Sig Start Date End Date Taking? Authorizing Provider  ACCU-CHEK SOFTCLIX LANCETS lancets  08/26/17   [provider]  acetaminophen (TYLENOL 8 HOUR) 650 MG CR  tablet Take 1 tablet (650 mg total) by mouth every 8 (eight) hours as needed for pain. 10/16/18   Steele Sizer, MD  albuterol (PROVENTIL HFA) 108 (90 Base) MCG/ACT inhaler Inhale 2 puffs into the lungs 2 (two) times daily as needed for shortness of breath.     [provider]  Alcohol Swabs (B-D SINGLE USE SWABS REGULAR) PADS  01/04/18   [provider]  amLODipine (NORVASC) 5 MG tablet  12/03/18   [provider]  ASPIRIN LOW DOSE 81 MG EC tablet TAKE 1 TABLET (81 MG TOTAL) BY MOUTH DAILY. 05/31/16   Steele Sizer, MD  Blood Glucose Monitoring Suppl (ACCU-CHEK AVIVA PLUS) w/Device KIT  11/02/15   [provider]  Cyanocobalamin (VITAMIN B12 PO) Take by mouth.    [provider]  DULoxetine (CYMBALTA) 60 MG capsule Take 1 capsule (60 mg total) by mouth daily. 02/05/19   Steele Sizer, MD  furosemide (LASIX) 20 MG tablet Take 1 tablet (20 mg total) by mouth daily. 04/01/19   Steele Sizer, MD  Icosapent Ethyl (VASCEPA) 1 g CAPS Take 2 capsules (2 g total) by mouth 2 (two) times daily. 12/02/18   Steele Sizer, MD  insulin NPH-regular Human (70-30) 100 UNIT/ML injection Inject 100 Units into the skin 2 (two) times daily with a meal.    Lonia Farber, MD  irbesartan-hydrochlorothiazide (AVALIDE) 300-12.5 MG tablet Take 1 tablet by mouth daily. 03/07/19   Steele Sizer, MD  levothyroxine (SYNTHROID) 25 MCG tablet One daily and two on sundays 04/01/19   Steele Sizer, MD  metFORMIN (GLUCOPHAGE-XR) 500 MG 24 hr tablet Take 500 mg by mouth. 2 tablets daily (total of 1000 mg daily) 12/03/17   [provider]  metoprolol tartrate (LOPRESSOR) 25 MG tablet Take 1 tablet (25 mg total) by mouth 2 (two) times daily. 01/06/19   Steele Sizer, MD  Misc Natural Products (OSTEO BI-FLEX ADV JOINT SHIELD PO) Take by mouth 2 (two) times a week.    [provider]  montelukast (SINGULAIR) 10 MG tablet Take 1 tablet (10 mg total) by mouth at bedtime.  04/01/19   Steele Sizer, MD  Omega-3 Fatty Acids (FISH OIL) 1000 MG CAPS Take by mouth.    [provider]  omeprazole (PRILOSEC) 40 MG capsule Take 1 capsule (40 mg total) by mouth daily. 02/05/19   Steele Sizer, MD  Potassium 99 MG TABS Take by mouth.    [provider]  pregabalin (LYRICA) 300 MG capsule Take 2 capsules (600 mg total) by mouth daily. Patient taking differently: Take 600 mg by mouth daily.  11/28/17   Steele Sizer, MD  Probiotic Product (PROBIOTIC-10 PO) Take by mouth.    [provider]  [place] 1ML/31G 31G X 5/16" 1 ML MISC  12/19/15   [provider]  rosuvastatin (CRESTOR) 20 MG tablet Take 1 tablet (20 mg total) by mouth daily. 08/27/18   Steele Sizer, MD  vitamin C (ASCORBIC ACID) 500 MG tablet Take 500 mg by mouth 2 (two) times daily.     [provider]  VITAMIN D, CHOLECALCIFEROL, PO Take 1,000 Units by mouth daily.     [provider]    Allergies Codeine, Atorvastatin, Hydrocodone, Latex, and Zolpidem  Family History  Problem Relation Age of Onset  . Heart attack Mother   . Aneurysm Mother   . Heart attack Maternal Grandfather   . Diabetes Maternal Grandfather     Social History Social History   Tobacco Use  . Smoking status: Never Smoker  . Smokeless tobacco: Never Used  Substance Use Topics  . Alcohol use: No    Alcohol/week: 0.0 standard drinks    Frequency: Never  . Drug use: No    Review of Systems Constitutional: No fever/chills Eyes: No visual changes. ENT: No sore throat. Cardiovascular: Denies chest pain. Respiratory: Denies shortness of breath. Gastrointestinal: No abdominal pain.  No nausea, no vomiting.  No diarrhea.   Genitourinary: Negative for dysuria. Musculoskeletal: Negative for back pain. Skin: Negative for rash. Neurological: Positive for weakness and numbness to the left side. ____________________________________________   PHYSICAL  EXAM:  VITAL SIGNS: ED Triage Vitals  Enc Vitals Group     BP 04/02/19 1735 136/64     Pulse Rate 04/02/19 1735 79     Resp 04/02/19 1735 19     Temp 04/02/19 1737 98.2 F (36.8 C)     Temp Source 04/02/19 1737 Oral     SpO2 04/02/19 1735 96 %     Weight 04/02/19 1741 245 lb 6 oz (111.3 kg)     Height --      Head Circumference --      Peak Flow --      Pain Score 04/02/19 1735 5    Constitutional: Alert and oriented.  Eyes: Conjunctivae are normal.  ENT      Head: Normocephalic and atraumatic.      Nose: No congestion/rhinnorhea.      Mouth/Throat: Mucous membranes are moist.      Neck: No stridor. Hematological/Lymphatic/Immunilogical: No cervical lymphadenopathy. Cardiovascular: Normal rate, regular rhythm.  No murmurs, rubs, or gallops.  Respiratory: Normal respiratory effort without tachypnea nor retractions. Breath sounds are clear and equal bilaterally. No wheezes/rales/rhonchi. Gastrointestinal: Soft and non tender. No rebound. No guarding.  Genitourinary: Deferred Musculoskeletal: Normal range of motion in all extremities. No lower extremity edema. Neurologic:  Normal speech and language. No facial asymetry. No upper extremity drift. Strength 5/5 in right upper extremity, 4+/5 in left upper extremity. Strength 5/5 in right lower extremity. Strength 4+/5 in left lower extremity. Sensation grossly intact. Skin:  Skin is warm, dry and intact. No rash noted. Psychiatric: Mood and affect are normal. Speech and behavior are normal. Patient exhibits appropriate insight and judgment.  ____________________________________________    LABS (pertinent positives/negatives)  INR 1.0 CMP wnl except glu 109, BUN 45, cr 1.35 CBC wbc 9.2, hgb 11.8, plt 254  ____________________________________________   EKG  I, Nance Pear, attending physician, personally viewed and interpreted this EKG  EKG Time: 1732 Rate: 74 Rhythm: sinus rhythm Axis: normal Intervals: qtc  419 QRS: narrow ST changes: no st elevation Impression: normal ekg   ____________________________________________    RADIOLOGY  CT head No acute abnormality  I, Nance Pear, personally discussed these images and results by phone with the on-call radiologist and used this discussion as part of my medical decision making.   ____________________________________________   PROCEDURES  Procedures  ____________________________________________   INITIAL IMPRESSION / ASSESSMENT AND PLAN / ED COURSE  Pertinent labs & imaging results that were available during  my care of the patient were reviewed by me and considered in my medical decision making (see chart for details).   Patient presented to the emergency department today because of concerns for left sided weakness and numbness.  Patient was called a CODE BLUE however I was not alerted to this until after neurology had already finished assessment.  On my exam patient did have some slight weakness to the left side.  Neurologist had talked to the patient about TPA.  I had my own discussion with the patient.  I did discuss concern for risk of bleeding and death.  Discussed that this is roughly 1 and 20.  Patient states that she was able to ambulate and her symptoms are improving.  I did ask Dr. Holland Commons about improving symptoms however she did state that improving symptoms is no longer a relative contraindication.  I did again discuss with patient if she would want TPA given risk of death and somewhat minimal symptoms considering she is still able to ambulate and has strength in her extremities.  Did discuss that her strength could potentially benefit and recover with rehab.  She states that she would still like the medication.  ____________________________________________   FINAL CLINICAL IMPRESSION(S) / ED DIAGNOSES  Final diagnoses:  Left-sided weakness     Note: This dictation was prepared with Dragon dictation. Any  transcriptional errors that result from this process are unintentional     Nance Pear, MD 04/02/19 2018

## 2019-04-02 NOTE — ED Notes (Signed)
Patient transported to MRI 

## 2019-04-02 NOTE — ED Notes (Signed)
Pt given a sandwich tray and beverage.

## 2019-04-03 ENCOUNTER — Inpatient Hospital Stay: Payer: Medicare HMO

## 2019-04-03 ENCOUNTER — Inpatient Hospital Stay (HOSPITAL_COMMUNITY)
Admit: 2019-04-03 | Discharge: 2019-04-03 | Disposition: A | Discharge status: Home / Self Care | Payer: Medicare HMO | Attending: Nurse Practitioner | Admitting: Nurse Practitioner

## 2019-04-03 DIAGNOSIS — R29898 Other symptoms and signs involving the musculoskeletal system: Secondary | ICD-10-CM | POA: Diagnosis present

## 2019-04-03 DIAGNOSIS — M6281 Muscle weakness (generalized): Principal | ICD-10-CM

## 2019-04-03 DIAGNOSIS — Z8659 Personal history of other mental and behavioral disorders: Secondary | ICD-10-CM

## 2019-04-03 DIAGNOSIS — I639 Cerebral infarction, unspecified: Secondary | ICD-10-CM

## 2019-04-03 LAB — ECHOCARDIOGRAM COMPLETE
Height: 62 in
Weight: 4031.77 oz

## 2019-04-03 LAB — GLUCOSE, CAPILLARY
Glucose-Capillary: 164 mg/dL — ABNORMAL HIGH (ref 70–99)
Glucose-Capillary: 144 mg/dL — ABNORMAL HIGH (ref 70–99)
Glucose-Capillary: 294 mg/dL — ABNORMAL HIGH (ref 70–99)
Glucose-Capillary: 230 mg/dL — ABNORMAL HIGH (ref 70–99)
Glucose-Capillary: 229 mg/dL — ABNORMAL HIGH (ref 70–99)

## 2019-04-03 LAB — HEMOGLOBIN A1C
Hgb A1c MFr Bld: 7.2 % — ABNORMAL HIGH (ref 4.8–5.6)
Mean Plasma Glucose: 159.94 mg/dL

## 2019-04-03 LAB — SARS CORONAVIRUS 2 BY RT PCR (HOSPITAL ORDER, PERFORMED IN ~~LOC~~ HOSPITAL LAB): SARS Coronavirus 2: NEGATIVE

## 2019-04-03 LAB — MRSA PCR SCREENING: MRSA by PCR: NEGATIVE

## 2019-04-03 MED ORDER — CHLORHEXIDINE GLUCONATE CLOTH 2 % EX PADS
6.0000 | MEDICATED_PAD | Freq: Every day | CUTANEOUS | Status: DC
  Administered 2019-04-04 (×2): 6 via TOPICAL
  Filled 2019-04-03: qty 6

## 2019-04-03 MED ORDER — SERTRALINE HCL 50 MG PO TABS
25.0000 mg | ORAL_TABLET | Freq: Every day | ORAL | Status: DC
  Administered 2019-04-04: 12:00:00 25 mg via ORAL
  Filled 2019-04-03: qty 1

## 2019-04-03 MED ORDER — INSULIN ASPART 100 UNIT/ML ~~LOC~~ SOLN
0.0000 [IU] | Freq: Three times a day (TID) | SUBCUTANEOUS | Status: DC
  Administered 2019-04-03: 3 [IU] via SUBCUTANEOUS
  Administered 2019-04-03: 5 [IU] via SUBCUTANEOUS
  Administered 2019-04-03: 1 [IU] via SUBCUTANEOUS
  Administered 2019-04-04: 3 [IU] via SUBCUTANEOUS
  Administered 2019-04-04: 9 [IU] via SUBCUTANEOUS
  Filled 2019-04-03 (×3): qty 1

## 2019-04-03 MED ORDER — LEVOTHYROXINE SODIUM 50 MCG PO TABS: 50.0000 ug | ORAL_TABLET | Freq: Every day | ORAL | Status: DC

## 2019-04-03 MED ORDER — LEVOTHYROXINE SODIUM 50 MCG PO TABS: 50.0000 ug | ORAL_TABLET | ORAL | Status: DC

## 2019-04-03 MED ORDER — DULOXETINE HCL 30 MG PO CPEP
60.0000 mg | ORAL_CAPSULE | Freq: Every day | ORAL | Status: DC
  Administered 2019-04-03: 60 mg via ORAL
  Filled 2019-04-03: qty 2

## 2019-04-03 MED ORDER — PREGABALIN 75 MG PO CAPS
300.0000 mg | ORAL_CAPSULE | Freq: Two times a day (BID) | ORAL | Status: DC
  Administered 2019-04-03 – 2019-04-04 (×3): 300 mg via ORAL
  Filled 2019-04-03 (×3): qty 4

## 2019-04-03 MED ORDER — MONTELUKAST SODIUM 10 MG PO TABS
10.0000 mg | ORAL_TABLET | Freq: Every day | ORAL | Status: DC
  Administered 2019-04-03: 10 mg via ORAL
  Filled 2019-04-03: qty 1

## 2019-04-03 MED ORDER — LEVOTHYROXINE SODIUM 25 MCG PO TABS
25.0000 ug | ORAL_TABLET | ORAL | Status: DC
  Administered 2019-04-03 – 2019-04-04 (×2): 25 ug via ORAL
  Filled 2019-04-03 (×2): qty 1

## 2019-04-03 MED ORDER — VITAMIN B-12 100 MCG PO TABS
100.0000 ug | ORAL_TABLET | Freq: Every day | ORAL | Status: DC
  Administered 2019-04-03 – 2019-04-04 (×2): 100 ug via ORAL
  Filled 2019-04-03: qty 1

## 2019-04-03 MED ORDER — ROSUVASTATIN CALCIUM 20 MG PO TABS
20.0000 mg | ORAL_TABLET | Freq: Every day | ORAL | Status: DC
  Administered 2019-04-03 – 2019-04-04 (×2): 20 mg via ORAL
  Filled 2019-04-03: qty 1
  Filled 2019-04-03: qty 2

## 2019-04-03 MED ORDER — PANTOPRAZOLE SODIUM 40 MG PO TBEC
40.0000 mg | DELAYED_RELEASE_TABLET | Freq: Every day | ORAL | Status: DC
  Administered 2019-04-03 – 2019-04-04 (×2): 40 mg via ORAL
  Filled 2019-04-03 (×2): qty 1

## 2019-04-03 MED ORDER — VITAMIN C 500 MG PO TABS
500.0000 mg | ORAL_TABLET | Freq: Two times a day (BID) | ORAL | Status: DC
  Administered 2019-04-03 – 2019-04-04 (×3): 500 mg via ORAL
  Filled 2019-04-03 (×3): qty 1

## 2019-04-03 MED ORDER — INSULIN ASPART 100 UNIT/ML ~~LOC~~ SOLN
0.0000 [IU] | Freq: Every day | SUBCUTANEOUS | Status: DC
  Administered 2019-04-03: 2 [IU] via SUBCUTANEOUS
  Filled 2019-04-03: qty 1

## 2019-04-03 MED ORDER — DULOXETINE HCL 30 MG PO CPEP
30.0000 mg | ORAL_CAPSULE | Freq: Every day | ORAL | Status: DC
  Administered 2019-04-04: 30 mg via ORAL
  Filled 2019-04-03: qty 1

## 2019-04-03 NOTE — Progress Notes (Signed)
Notified by RN pt has developed slight slurred speech and new LUE drift during neurological assessment, therefore placed order for stat CT Head.   Summer [name], [place] Pager [phone] (please enter 7 digits) PCCM Consult Pager [phone] (please enter 7 digits)

## 2019-04-03 NOTE — Consult Note (Signed)
Stroke Neurology Consult [facility] at [facility]  Chief Complaint: left sided weakness/numbness Referring Provider: Bettey Costa, MD  HPI: Ms. LAURINE KUYPER is a 68 y.o.  female with a history significant for  hypertension, hyperlipidemia, hypothyroidism, type 2 diabetes mellitus with diabetic polyneuropathy, with long-term current use of insulin, chocolate arthropathy, and CKD presenting to the ED with chief complaints of left sided and facial numbness/weakness. History provided by patient and supplemented by review of EMR.  Patient reported that she had a sudden onset of left hemibody weakness/numbness including her face as well around 15:30 on 04/02/2019. She denied any double vision or changes to her speech. She arrived to the ED as a stroke alert and was evaluated by teleneurology.   Initial BP was 137/72 mmHg. She had a documented NIHSS 4. Initial non-contrasted HCT demonstrated no acute findings. Images were independently reviewed.  She was deemed a tPA candidate and received it at 17:55.   She was admitted to the stepdown/ICU overnight. Patient got her MRI brain w/o contrast last night around 21:58 and it demonstrated no acute infarct. Images were independently reviewed. Reportedly, this morning around 5 AM she had another event where she felt weak/numb in her left upper extremity. Therefore, a non-contrasted HCT was ordered STAT, which demonstrated no acute findings. Images were independently reviewed.   Per chart review of prior PCP notes from 03/20/2019: "Patient reports struggle with stress, depression over the past few weeks, mostly due to COVID-19 restrictions and financial strain, which has led to some stress eating." Patient states that she's struggling with her depression. She stated "I feel like giving up." She said she doesn't have any close family or friends. She does have a son, who she has a good relationship with but does not talk too much to him because she doesn't want  to bother him. She states that it has been hard at home because of COVID and can't do anything outside.   Of note, patient takes ASA 81 mg daily at home. No statin at home.   Past Medical History:  Diagnosis Date  . Anemia   . Arthritis   . Asthma   . Carpal tunnel syndrome   . Chronic kidney disease   . Depressive disorder   . Diabetes mellitus   . Dyspnea   . GERD (gastroesophageal reflux disease)   . Headache   . History of hiatal hernia   . Hyperlipidemia   . Hypertension   . Insomnia   . Lumbago   . Memory loss   . Obesity   . 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)   . Pneumonia   . Rhinitis, allergic   . Rosacea   . Thyroid disease   . Unspecified hearing loss   . Unspecified hereditary and idiopathic peripheral neuropathy   . Unspecified sleep apnea     Past Surgical History:  Procedure Laterality Date  . ANKLE SURGERY Left approx Jan 2018  . CATARACT EXTRACTION  01/2011   right  . eye lid surgery  2013   bilateral  . FOOT SURGERY    . NECK SURGERY    . TUBAL LIGATION    . VAGINAL HYSTERECTOMY  1989   Allergies  Allergen Reactions  . Codeine Other (See Comments)    "TRIPPED OUT"  DIDN'T LIKE THE [place]  . Atorvastatin     muscle pain  . Hydrocodone     itching  . Latex Rash  . Zolpidem Other (See Comments)    Sleep  walk   Prior to Admission medications   Medication Sig Start Date End Date Taking? Authorizing Provider  acetaminophen (TYLENOL 8 HOUR) 650 MG CR tablet Take 1 tablet (650 mg total) by mouth every 8 (eight) hours as needed for pain. 10/16/18  Yes Sowles, Drue Stager, MD  amLODipine (NORVASC) 2.5 MG tablet Take 2.5 mg by mouth daily.  12/03/18  Yes [provider]  ASPIRIN LOW DOSE 81 MG EC tablet TAKE 1 TABLET (81 MG TOTAL) BY MOUTH DAILY. 05/31/16  Yes Sowles, Drue Stager, MD  Cyanocobalamin (VITAMIN B12 PO) Take 1 tablet by mouth daily.    Yes [provider]  DULoxetine (CYMBALTA) 60 MG capsule Take 1 capsule (60 mg total) by mouth  daily. 02/05/19  Yes Sowles, Drue Stager, MD  furosemide (LASIX) 20 MG tablet Take 1 tablet (20 mg total) by mouth daily. 04/01/19  Yes Sowles, Drue Stager, MD  Icosapent Ethyl (VASCEPA) 1 g CAPS Take 2 capsules (2 g total) by mouth 2 (two) times daily. 12/02/18  Yes Sowles, Drue Stager, MD  insulin NPH-regular Human (70-30) 100 UNIT/ML injection Inject 100 Units into the skin 2 (two) times daily with a meal.   Yes Lonia Farber, MD  irbesartan-hydrochlorothiazide (AVALIDE) 300-12.5 MG tablet Take 1 tablet by mouth daily. 03/07/19  Yes Steele Sizer, MD  levothyroxine (SYNTHROID) 25 MCG tablet One daily and two on sundays Patient taking differently: Take 25-50 mcg by mouth daily before breakfast. One tablet daily and two tablets on Sundays 04/01/19  Yes Sowles, Drue Stager, MD  metFORMIN (GLUCOPHAGE-XR) 500 MG 24 hr tablet Take 500 mg by mouth. 2 tablets daily (total of 1000 mg daily) 12/03/17  Yes [provider]  metoprolol tartrate (LOPRESSOR) 25 MG tablet Take 1 tablet (25 mg total) by mouth 2 (two) times daily. 01/06/19  Yes Sowles, Drue Stager, MD  Misc Natural Products (OSTEO BI-FLEX ADV JOINT SHIELD PO) Take by mouth 2 (two) times a week.   Yes [provider]  montelukast (SINGULAIR) 10 MG tablet Take 1 tablet (10 mg total) by mouth at bedtime. 04/01/19  Yes Sowles, Drue Stager, MD  Omega-3 Fatty Acids (FISH OIL) 1000 MG CAPS Take 1,000 mg by mouth daily.    Yes [provider]  omeprazole (PRILOSEC) 40 MG capsule Take 1 capsule (40 mg total) by mouth daily. 02/05/19  Yes Steele Sizer, MD  Potassium 99 MG TABS Take by mouth.   Yes [provider]  pregabalin (LYRICA) 300 MG capsule Take 300 mg by mouth 2 (two) times a day. 03/06/19  Yes [provider]  Probiotic Product (PROBIOTIC-10 PO) Take by mouth.   Yes [provider]  rosuvastatin (CRESTOR) 20 MG tablet Take 1 tablet (20 mg total) by mouth daily. 08/27/18  Yes Sowles, Drue Stager, MD  vitamin C (ASCORBIC  ACID) 500 MG tablet Take 500 mg by mouth 2 (two) times daily.    Yes [provider]  VITAMIN D, CHOLECALCIFEROL, PO Take 1,000 Units by mouth daily.    Yes [provider]  ACCU-CHEK SOFTCLIX LANCETS lancets  08/26/17   [provider]  albuterol (PROVENTIL HFA) 108 (90 Base) MCG/ACT inhaler Inhale 2 puffs into the lungs 2 (two) times daily as needed for shortness of breath.     [provider]  Alcohol Swabs (B-D SINGLE USE SWABS REGULAR) PADS  01/04/18   [provider]  Blood Glucose Monitoring Suppl (ACCU-CHEK AVIVA PLUS) w/Device KIT  11/02/15   [provider]  Percival 1ML/31G 31G X 5/16"  1 ML MISC  12/19/15   [provider]   Family History  Problem Relation Age of Onset  . Heart attack Mother   . Aneurysm Mother   . Heart attack Maternal Grandfather   . Diabetes Maternal Grandfather    Social History   Socioeconomic History  . Marital status: Significant Other    Spouse name: Not on file  . Number of children: 1  . Years of education: Not on file  . Highest education level: High school graduate  Occupational History  . Occupation: retired  Scientific laboratory technician  . Financial resource strain: Not hard at all  . Food insecurity    Worry: Never true    Inability: Never true  . Transportation needs    Medical: No    Non-medical: No  Tobacco Use  . Smoking status: Never Smoker  . Smokeless tobacco: Never Used  Substance and Sexual Activity  . Alcohol use: No    Alcohol/week: 0.0 standard drinks    Frequency: Never  . Drug use: No  . Sexual activity: Yes    Partners: Male  Lifestyle  . Physical activity    Days per week: 0 days    Minutes per session: 0 min  . Stress: Rather much  Relationships  . Social connections    Talks on phone: More than three times a week    Gets together: Once a week    Attends religious service: Never    Active member of club or organization: No    Attends meetings of  clubs or organizations: Never    Relationship status: Living with partner  . Intimate partner violence    Fear of current or ex partner: No    Emotionally abused: No    Physically abused: No    Forced sexual activity: No  Other Topics Concern  . Not on file  Social History Narrative   Son lives in [place] (retired from [employer]) and her granddaughter (73yr old) [place]      Continuous Infusions: . sodium chloride 75 mL/hr at 04/03/19 0400   Scheduled Meds:  . Chlorhexidine Gluconate Cloth  6 each Topical Daily  . DULoxetine  60 mg Oral Daily  . insulin aspart  0-5 Units Subcutaneous QHS  . insulin aspart  0-9 Units Subcutaneous TID WC  . levothyroxine  25 mcg Oral Once per day on Mon Tue Wed Thu Fri Sat   And  . [START ON 04/06/2019] levothyroxine  50 mcg Oral Once per day on Sun  . montelukast  10 mg Oral QHS  . pantoprazole  40 mg Oral Daily  . pregabalin  300 mg Oral BID  . rosuvastatin  20 mg Oral Daily  . sodium chloride flush  3 mL Intravenous Once  . vitamin B-12  100 mcg Oral Daily  . vitamin C  500 mg Oral BID   PRN Meds: acetaminophen **OR** acetaminophen (TYLENOL) oral liquid 160 mg/5 mL **OR** acetaminophen, senna-docusate, traZODone   Review of Systems Pertinent items noted in HPI and remainder of comprehensive ROS otherwise negative.   Objective: Temp:  [97.6 F (36.4 C)-98.2 F (36.8 C)] 97.9 F (36.6 C) (07/23 0400) Pulse Rate:  [64-87] 83 (07/23 0627) Resp:  [12-27] 19 (07/23 0627) BP: (112-145)/(51-72) 132/54 (07/23 0622) SpO2:  [93 %-100 %] 95 % (07/23 0627) Weight:  [111.3 kg-114.3 kg] 114.3 kg (07/23 0151)  Intake/Output Summary (Last 24 hours) at 04/03/2019 0801 Last data filed at 04/03/2019 0400  Gross per 24 hour  Intake 263.97 ml  Output 200 ml  Net 63.97 ml    Wt Readings from Last 3 Encounters:  04/03/19 114.3 kg  03/20/19 115.7 kg  02/20/19 114.1 kg      Physical Exam:  GENERAL:  No acute distress.   EYES:   Pupils: pupils equally round, reactive to light.  ENT: Throat: oropharynx clear.  CARDIOVASCULAR: regular rate RESPIRATORY: normal work of breathing GASTROINTESTINAL:  Soft, non-tender SKIN:   Inspection: well perfused, no edema.   MENTAL STATUS EXAM:    Orientation: Alert and oriented to person, place and time.  Memory: Cooperative, follows commands well. Recent and remote memory normal.. Attention, concentration: Attention span and concentration are normal.  Language: Speech is clear and language is normal.  Fund of knowledge: Aware of current events, vocabulary appropriate for patient age.   CRANIAL NERVES:    CN 2 (Optic): Visual fields intact to confrontation, funduscopic examination: optic discs cannot be visualized   CN 3,4,6 (EOM): Pupils equal and reactive to light. Full extraocular eye movement without nystagmus.  CN 5 (Trigeminal): Facial sensation is decreased on the left (mildly)  CN 7 (Facial): No facial weakness or asymmetry.  CN 8 (Auditory): Auditory acuity grossly normal.  CN 9,10 (Glossophar): The uvula is midline, the palate elevates symmetrically.  CN 11 (spinal access): Normal sternocleidomastoid and trapezius strength.  CN 12 (Hypoglossal): The tongue is midline. No atrophy or fasciculations.Marland Kitchen   MOTOR:  Muscle Strength: Strength - 5/5 on the right hemibody. 4+/5 in the left hemibody. Mild drift in the left upper extremity. Muscle Tone: Tone and muscle bulk are normal in the upper and lower extremities.   REFLEXES: DTRs - 1+ and symmetrical in all four extremities, plantar responses are muted bilaterally.   COORDINATION: Intact finger-to-nose and heel-to-shin. no tremor.   SENSATION: Decreased in the left hemibody (mild)   GAIT: Deferred  Interval: Shift assessment (07/23 0800) Level of Consciousness (1a.)   : Alert, keenly responsive (07/23 0858) LOC Questions (1b. )   +: Answers both questions correctly (07/23 0858) LOC Commands (1c. )   + :  Performs both tasks correctly (07/23 0858) Best Gaze (2. )  +: Normal (07/23 0858) Visual (3. )  +: No visual loss (07/23 0858) Facial Palsy (4. )    : Normal symmetrical movements (07/23 0858) Motor Arm, Left (5a. )   +: Drift (07/23 0858) Motor Arm, Right (5b. )   +: No drift (07/23 0858) Motor Leg, Left (6a. )   +: No drift (07/23 0858) Motor Leg, Right (6b. )   +: No drift (07/23 0858) Limb Ataxia (7. ): Absent (07/23 0858) Sensory (8. )   +: Mild-to-moderate sensory loss, patient feels pinprick is less sharp or is dull on the affected side, or there is a loss of superficial pain with pinprick, but patient is aware of being touched (07/23 0858) Best Language (9. )   +: No aphasia (07/23 0858) Dysarthria (10. ): Normal (07/23 0858) Extinction/Inattention (11.)   +: No Abnormality (07/23 0858) Complete NIHSS TOTAL: 2 (07/23 0858)    Labs (I have reviewed the labs below) Recent Labs  Lab 04/02/19 1743  WBC 9.2  HGB 11.8*  HCT 35.3*  PLT 254  MCV 87.6   Recent Labs  Lab 04/02/19 1743  CHOL 152  HDL 32*  TRIG 201*  LDL 80 HgbA1c pending  Recent Labs  Lab 04/02/19 1743  NA 141  K 4.2  CL 103  CO2 24  BUN 45*  CREATININE 1.35*  CALCIUM 9.6      Diagnostic Studies: (I have independently reviewed the images and agree with the report)    Assessment: Ms. MARIANY MACKINTOSH is a 68 y.o.  female with the above history who presented with left hemibody weakness/numbness s/p tPA. Symptoms improved but still present. MRI brain w/o demonstrated no acute infarct. Clinical presentation is highly unlikely related to an ischemic stroke given negative MRI and still with on-going symptoms. We had a long discussion that these symptoms may be a manifestation of her depression. She was fairly receptive to this concept. I discussed that she should establish care with a psychologist. She has a psychiatrist that she's tried a few times, but didn't think it was helpful. She is willing to get a  referral with a psychologist.   Principal Problem:   Left arm weakness  Recommendations: - Continue with post-tPA protocol - as mentioned, clinical presentation is likely not an ischemic stroke.  - Will follow up on TTE w/ bubble study - Will follow up on carotid ultrasound    Stroke evaluation - following 24 hr post-tPA evaluation - Telemetry with frequent neurochecks and vitals - Secondary stroke prevention: can continue with home ASA 81 mg daily for cardiac prevention. Won't need to make any changes  - Therapy consults: Recommend PT consult, OT consult, Speech consult - Discussed with patient about seeking care from a psychologist upon discharge; she's agreeable to this plan. If able, a referral can be placed while she's in the hospital or she can follow up with her PCP to get the referral.   - Please call neurology with any further questions or concerns

## 2019-04-03 NOTE — Evaluation (Signed)
SLP Cancellation Note  Patient Details Name: Summer [name] MRN: [id number] DOB: [birth year]   Cancelled treatment:       Reason Eval/Treat Not Completed: SLP screened, no needs identified, will sign off  ST discussed with NSG. NSG reports pt has no communication deficits or swallow deficits. Pt in on a regular with thin diet. ST educated NSG to contact ST if changes in status occur.    [place] Sauber 04/03/2019, 10:09 AM

## 2019-04-03 NOTE — Progress Notes (Signed)
*  PRELIMINARY RESULTS* Echocardiogram 2D Echocardiogram has been performed.  Summer [name] 04/03/2019, 9:14 AM

## 2019-04-03 NOTE — Consult Note (Signed)
[facility] Consult   Reason for Consult: Assess depression or anxiety Referring Physician: ICU physician Patient Identification: Summer [name] MRN:  [id number] Principal Diagnosis: Left arm weakness Diagnosis:  Principal Problem:   Left arm weakness   Total Time spent with patient: 45 minutes  Subjective:   Summer [name] is a 68 y.o. female patient admitted with history of depression and recent left arm weakness thought to be CVA, but negative work-up.Marland Kitchen  HPI: Patient is seen and examined.  Patient is a 68 year old female with a past medical history significant for anemia, chronic renal disease, supposedly depression, diabetes mellitus type 2, GERD, hypertension, hyperlipidemia, insomnia, memory loss, obesity, thyroid disease and unspecified neuropathy who presented to the [facility] emergency room on 7/22 in an emergent situation with sudden tingling and weakness to the left side of her body.  At the time of evaluation her lips were reportedly numb.  She stated that the symptoms had started 30 minutes prior to evaluation.  She also reported a headache at that time.  She was evaluated by neurology who treated the patient with TPA.  She was admitted to the intensive care unit.  She was noted to be oriented x4, and did not demonstrate any memory deficits.  Her initial NIH stroke scale score was 4.  Code stroke was initiated as mentioned above.  An MRI was obtained which was essentially normal.  Echocardiogram showed an ejection fraction of greater than 65%.  Wall motion in the left ventricle was thought to be normal.  Right ventricle had normal systolic function.  Mitral valve was degenerate, and there was mild thickening of the mitral valve leaflet.  There was moderate mitral annular calcification.  Aortic valve had an indeterminate number of cusps, the aortic itself was felt to be normal in size and structure.  The interatrial septum was not well visualized.   The patient was noted to have a history of depression, and was reportedly taken duloxetine.  On interview today the patient stated she was unaware that she was still taking antidepressant medication if she was.  She stated that most recently she had had no new stressors.  The last major stressor was in February of this year.  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  She denied any history of alcohol or substance issues.  She stated since then they had been financial stressors.  She denied any helplessness, hopelessness or worthlessness.  She did admit to some anxiety.  She denied any previous psychiatric admissions.  She was unable to remember any other psychiatric medicine she may have taken in the past.  She denied any significant anxiety provoking issues yesterday.  Her last outpatient visit at the [facility] system on 12/19/2018 at the [place] clinic showed that she was still taking the duloxetine.  At least it shows a 60 mg dose at that time.  The admission physicians continued the Cymbalta at this time at 60 mg.  She had previously taken doxepin for sleep, but was no longer taking that.  It does not appear as though the Cymbalta was beneficial.  Past Psychiatric History: Patient stated that she had not seen a psychiatrist in many years.  She was taking Cymbalta recently for that, but did not realize she was still taking it.  She also had previously been taking doxepin as well as amitriptyline.  Both of those were not active.  She was unable to remember  any other psychiatric medication she may have been taking.  No previous psychiatric admissions.  Risk to Self:   Risk to Others:   Prior Inpatient Therapy:   Prior Outpatient Therapy:    Past Medical History:  Past Medical History:  Diagnosis Date  . Anemia   . Arthritis   . Asthma   . Carpal tunnel syndrome   . Chronic kidney disease   . Depressive disorder   . Diabetes mellitus   . Dyspnea   . GERD  (gastroesophageal reflux disease)   . Headache   . History of hiatal hernia   . Hyperlipidemia   . Hypertension   . Insomnia   . Lumbago   . Memory loss   . Obesity   . 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)   . Pneumonia   . Rhinitis, allergic   . Rosacea   . Thyroid disease   . Unspecified hearing loss   . Unspecified hereditary and idiopathic peripheral neuropathy   . Unspecified sleep apnea     Past Surgical History:  Procedure Laterality Date  . ANKLE SURGERY Left approx Jan 2018  . CATARACT EXTRACTION  01/2011   right  . eye lid surgery  2013   bilateral  . FOOT SURGERY    . NECK SURGERY    . TUBAL LIGATION    . VAGINAL HYSTERECTOMY  1989   Family History:  Family History  Problem Relation Age of Onset  . Heart attack Mother   . Aneurysm Mother   . Heart attack Maternal Grandfather   . Diabetes Maternal Grandfather    Family Psychiatric  History: Noncontributory Social History:  Social History   Substance and Sexual Activity  Alcohol Use No  . Alcohol/week: 0.0 standard drinks  . Frequency: Never     Social History   Substance and Sexual Activity  Drug Use No    Social History   Socioeconomic History  . Marital status: Significant Other    Spouse name: Not on file  . Number of children: 1  . Years of education: Not on file  . Highest education level: High school graduate  Occupational History  . Occupation: retired  Scientific laboratory technician  . Financial resource strain: Not hard at all  . Food insecurity    Worry: Never true    Inability: Never true  . Transportation needs    Medical: No    Non-medical: No  Tobacco Use  . Smoking status: Never Smoker  . Smokeless tobacco: Never Used  Substance and Sexual Activity  . Alcohol use: No    Alcohol/week: 0.0 standard drinks    Frequency: Never  . Drug use: No  . Sexual activity: Yes    Partners: Male  Lifestyle  . Physical activity    Days per week: 0 days    Minutes per session: 0 min  . Stress: Rather  much  Relationships  . Social connections    Talks on phone: More than three times a week    Gets together: Once a week    Attends religious service: Never    Active member of club or organization: No    Attends meetings of clubs or organizations: Never    Relationship status: Living with partner  Other Topics Concern  . Not on file  Social History Narrative   Son lives in [place] (retired from [employer]) and her granddaughter (80yrs old) lives in [place] with her mother.   Additional Social History:    Allergies:   Allergies  Allergen Reactions  . Codeine Other (See Comments)    "TRIPPED OUT"  DIDN'T LIKE THE [place]  . Atorvastatin     muscle pain  . Hydrocodone     itching  . Latex Rash  . Zolpidem Other (See Comments)    Sleep walk    Labs:  Results for orders placed or performed during the hospital encounter of 04/02/19 (from the past 48 hour(s))  Glucose, capillary     Status: Abnormal   Collection Time: 04/02/19  5:28 PM  Result Value Ref Range   Glucose-Capillary 112 (H) 70 - 99 mg/dL   Comment 1 Notify RN    Comment 2 Document in Chart   Protime-INR     Status: None   Collection Time: 04/02/19  5:43 PM  Result Value Ref Range   Prothrombin Time 13.0 11.4 - 15.2 seconds   INR 1.0 0.8 - 1.2    Comment: (NOTE) INR goal varies based on device and disease states. Performed at [facility], [address] [place], [place] [address]   APTT     Status: None   Collection Time: 04/02/19  5:43 PM  Result Value Ref Range   aPTT 27 24 - 36 seconds    Comment: Performed at [facility], [address] [place], [place] [address]  CBC     Status: Abnormal   Collection Time: 04/02/19  5:43 PM  Result Value Ref Range   WBC 9.2 4.0 - 10.5 K/uL   RBC 4.03 3.87 - 5.11 MIL/uL   Hemoglobin 11.8 (L) 12.0 - 15.0 g/dL   HCT 35.3 (L) 36.0 - 46.0 %   MCV 87.6 80.0 - 100.0 fL   MCH 29.3 26.0 - 34.0 pg   MCHC 33.4 30.0 - 36.0 g/dL   RDW 15.6 (H)  11.5 - 15.5 %   Platelets 254 150 - 400 K/uL   nRBC 0.0 0.0 - 0.2 %    Comment: Performed at [facility], [address] [place], [place] [address]  Differential     Status: Abnormal   Collection Time: 04/02/19  5:43 PM  Result Value Ref Range   Neutrophils Relative % 59 %   Neutro Abs 5.4 1.7 - 7.7 K/uL   Lymphocytes Relative 24 %   Lymphs Abs 2.2 0.7 - 4.0 K/uL   Monocytes Relative 11 %   Monocytes Absolute 1.0 0.1 - 1.0 K/uL   Eosinophils Relative 4 %   Eosinophils Absolute 0.4 0.0 - 0.5 K/uL   Basophils Relative 1 %   Basophils Absolute 0.1 0.0 - 0.1 K/uL   Immature Granulocytes 1 %   Abs Immature Granulocytes 0.09 (H) 0.00 - 0.07 K/uL    Comment: Performed at [facility], [address] [place],  [address]  Comprehensive metabolic panel     Status: Abnormal   Collection Time: 04/02/19  5:43 PM  Result Value Ref Range   Sodium 141 135 - 145 mmol/L   Potassium 4.2 3.5 - 5.1 mmol/L   Chloride 103 98 - 111 mmol/L   CO2 24 22 - 32 mmol/L   Glucose, Bld 109 (H) 70 - 99 mg/dL   BUN 45 (H) 8 - 23 mg/dL   Creatinine, Ser 1.35 (H) 0.44 - 1.00 mg/dL   Calcium 9.6 8.9 - 10.3 mg/dL   Total Protein 7.1 6.5 - 8.1 g/dL   Albumin 4.4 3.5 - 5.0 g/dL   AST 29 15 - 41 U/L   ALT  32 0 - 44 U/L   Alkaline Phosphatase 77 38 - 126 U/L   Total Bilirubin 0.6 0.3 - 1.2 mg/dL   GFR calc non Af Amer 40 (L) >60 mL/min   GFR calc Af Amer 46 (L) >60 mL/min   Anion gap 14 5 - 15    Comment: Performed at [facility], [address] [place], [place] [address]  Lipid panel     Status: Abnormal   Collection Time: 04/02/19  5:43 PM  Result Value Ref Range   Cholesterol 152 0 - 200 mg/dL   Triglycerides 201 (H) <150 mg/dL   HDL 32 (L) >40 mg/dL   Total CHOL/HDL Ratio 4.8 RATIO   VLDL 40 0 - 40 mg/dL   LDL Cholesterol 80 0 - 99 mg/dL    Comment:        Total Cholesterol/HDL:CHD Risk Coronary Heart Disease Risk Table                     Men   Women  1/2  Average Risk   3.4   3.3  Average Risk       5.0   4.4  2 X Average Risk   9.6   7.1  3 X Average Risk  23.4   11.0        Use the calculated Patient Ratio above and the CHD Risk Table to determine the patient's CHD Risk.        ATP III CLASSIFICATION (LDL):  <100     mg/dL   Optimal  100-129  mg/dL   Near or Above                    Optimal  130-159  mg/dL   Borderline  160-189  mg/dL   High  >190     mg/dL   Very High Performed at [facility], [address] [place], [place] [address]   Hemoglobin A1c     Status: Abnormal   Collection Time: 04/02/19  5:43 PM  Result Value Ref Range   Hgb A1c MFr Bld 7.2 (H) 4.8 - 5.6 %    Comment: (NOTE) Pre diabetes:          5.7%-6.4% Diabetes:              >6.4% Glycemic control for   <7.0% adults with diabetes    Mean Plasma Glucose 159.94 mg/dL    Comment: Performed at [facility] [address], [place], [place] [address]  MRSA PCR Screening     Status: None   Collection Time: 04/03/19  1:58 AM   Specimen: Nasal Mucosa; Nasopharyngeal  Result Value Ref Range   MRSA by PCR NEGATIVE NEGATIVE    Comment:        The GeneXpert MRSA Assay (FDA approved for NASAL specimens only), is one component of a comprehensive MRSA colonization surveillance program. It is not intended to diagnose MRSA infection nor to guide or monitor treatment for MRSA infections. Performed at [facility], [address] [place], [place] [address]   Glucose, capillary     Status: Abnormal   Collection Time: 04/03/19  2:15 AM  Result Value Ref Range   Glucose-Capillary 164 (H) 70 - 99 mg/dL  Glucose, capillary     Status: Abnormal   Collection Time: 04/03/19  7:55 AM  Result Value Ref Range   Glucose-Capillary 144 (H) 70 - 99 mg/dL  Glucose, capillary  Status: Abnormal   Collection Time: 04/03/19 10:46 AM  Result Value Ref Range   Glucose-Capillary 294 (H) 70 - 99 mg/dL    Current Facility-Administered Medications   Medication Dose Route Frequency Provider Last Rate Last Dose  . acetaminophen (TYLENOL) tablet 650 mg  650 mg Oral Q4H PRN Lang Snow, NP       Or  . acetaminophen (TYLENOL) solution 650 mg  650 mg Per Tube Q4H PRN Lang Snow, NP       Or  . acetaminophen (TYLENOL) suppository 650 mg  650 mg Rectal Q4H PRN Lang Snow, NP      . Chlorhexidine Gluconate Cloth 2 % PADS 6 each  6 each Topical Daily Ouma, Bing Neighbors, NP      . DULoxetine (CYMBALTA) DR capsule 60 mg  60 mg Oral Daily Awilda Bill, NP   60 mg at 04/03/19 0932  . insulin aspart (novoLOG) injection 0-5 Units  0-5 Units Subcutaneous QHS Awilda Bill, NP      . insulin aspart (novoLOG) injection 0-9 Units  0-9 Units Subcutaneous TID WC Awilda Bill, NP   5 Units at 04/03/19 1148  . levothyroxine (SYNTHROID) tablet 25 mcg  25 mcg Oral Once per day on Mon Tue Wed Thu Fri Sat Awilda Bill, NP   25 mcg at 04/03/19 0541   And  . [START ON 04/06/2019] levothyroxine (SYNTHROID) tablet 50 mcg  50 mcg Oral Once per day on Sun Blakeney, Dana G, NP      . montelukast (SINGULAIR) tablet 10 mg  10 mg Oral QHS Awilda Bill, NP      . pantoprazole (PROTONIX) EC tablet 40 mg  40 mg Oral Daily Awilda Bill, NP   40 mg at 04/03/19 0932  . pregabalin (LYRICA) capsule 300 mg  300 mg Oral BID Awilda Bill, NP   300 mg at 04/03/19 0541  . rosuvastatin (CRESTOR) tablet 20 mg  20 mg Oral Daily Awilda Bill, NP   20 mg at 04/03/19 0932  . senna-docusate (Senokot-S) tablet 1 tablet  1 tablet Oral QHS PRN Lang Snow, NP      . sodium chloride flush (NS) 0.9 % injection 3 mL  3 mL Intravenous Once Nance Pear, MD      . traZODone (DESYREL) tablet 50 mg  50 mg Oral QHS PRN Lance Coon, MD   50 mg at 04/03/19 0007  . vitamin B-12 (CYANOCOBALAMIN) tablet 100 mcg  100 mcg Oral Daily Awilda Bill, NP   100 mcg at 04/03/19 7425  . vitamin C (ASCORBIC ACID) tablet 500 mg   500 mg Oral BID Awilda Bill, NP   500 mg at 04/03/19 9563    Musculoskeletal: Strength & Muscle Tone: within normal limits Gait & Station: Lying in the bed Patient leans: N/A  Psychiatric Specialty Exam: Physical Exam  Nursing note and vitals reviewed. Constitutional: She is oriented to person, place, and time. She appears well-developed and well-nourished.  HENT:  Head: Normocephalic and atraumatic.  Respiratory: Effort normal.  Neurological: She is alert and oriented to person, place, and time.    ROS  Blood pressure (!) 94/43, pulse 89, temperature 98.3 F (36.8 C), temperature source Oral, resp. rate 16, height 5\' 2"  (1.575 m), weight 114.3 kg, SpO2 98 %.Body mass index is 46.09 kg/m.  General Appearance: Disheveled  Eye Contact:  Fair  Speech:  Normal Rate  Volume:  Normal  Mood:  Anxious  Affect:  Congruent  Thought Process:  Coherent and Descriptions of Associations: Intact  Orientation:  Full (Time, Place, and Person)  Thought Content:  Logical  Suicidal Thoughts:  No  Homicidal Thoughts:  No  Memory:  Immediate;   Fair Recent;   Fair Remote;   Fair  Judgement:  Intact  Insight:  Fair  Psychomotor Activity:  Psychomotor Retardation  Concentration:  Concentration: Fair and Attention Span: Fair  Recall:  [employer] of Knowledge:  Fair  Language:  Fair  Akathisia:  Negative  Handed:  Right  AIMS (if indicated):     Assets:  Desire for Improvement Resilience  ADL's:  Intact  Cognition:  WNL  Sleep:        Treatment Plan Summary: Daily contact with patient to assess and evaluate symptoms and progress in treatment, Medication management and Plan : Patient is seen and examined.  Patient is a 68 year old female with the above-stated past medical and psychiatric history in which psychiatric consultation was requested after symptoms suggestive of a cerebrovascular accident.  The work-up for that is been negative so far.  The patient admitted to a history of  depression, but was unaware she was still taking psychiatric medications.  Currently she is on Cymbalta 60 mg p.o. daily.  The last primary care visit her endocrinology visit that is in the chart shows at least they felt as though she was still taking it at that time.  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  She admitted to having financial stressors currently.  I do not really think the Cymbalta is doing anything at this point.  She has been taking it for a while, and so what I recommend is reducing the dosage to 30 mg a day for 2 to 3 days, and then stopping it.  There is a possibility that some of her symptoms may have been from a discontinuation syndrome related to acute stoppage of the Cymbalta, but I am unclear on that at least at this point.  Either way I will reduce her dosage to 30 mg a day, and given her history Zoloft would most likely be the best choice for her.  We will start her on 25 mg today, and that will be titrated during the course the hospitalization.  We will reassess her in the morning, and if everything continues to go well we will consider increasing dosage to 50 mg p.o. daily.  Disposition: No evidence of imminent risk to self or others at present.    1. reduce Cymbalta to 30 mg p.o. daily. 2.  Start Zoloft 25 mg p.o. daily. 3.  We will continue to follow with you.  Sharma Covert, MD 04/03/2019 2:47 PM

## 2019-04-03 NOTE — Progress Notes (Signed)
PT Cancellation Note  Patient Details Name: Summer [name] MRN: [id number] DOB: 06-03-1951   Cancelled Treatment:    Reason Eval/Treat Not Completed: Patient not medically ready.  Per documentation, pt received TPA and will need to be held until [place] on 04/03/2019.  Will return for evaluation following 24 hour hold period and follow up CT scan.  Roxanne Gates, PT, DPT  Roxanne Gates 04/03/2019, 8:28 AM

## 2019-04-03 NOTE — Progress Notes (Signed)
OT Cancellation Note  Patient Details Name: Summer [name] MRN: [id number] DOB: Aug 13, 1951   Cancelled Treatment:    Reason Eval/Treat Not Completed: Patient not medically ready. Consult received and chart reviewed.  Patient admitted to ICU s/p tPA infusion (ending at 40, on 04/02/19).  Per guidelines, to be on strict bedrest x24 hours post infusion.  Will continue to follow and initiate as medically appropriate.  Shara Blazing, M.S., OTR/L Ascom: [phone] 04/03/19, 9:12 AM

## 2019-04-03 NOTE — Progress Notes (Signed)
[place] [facility] [place] NAME: Summer [name]    MR#:  [id number]  DATE OF BIRTH:  Jun 22, 1951  SUBJECTIVE:  Patient presented with left-sided weakness she is status post TPA  REVIEW OF SYSTEMS:    Review of Systems  Constitutional: Negative for fever, chills weight loss HENT: Negative for ear pain, nosebleeds, congestion, facial swelling, rhinorrhea, neck pain, neck stiffness and ear discharge.   Respiratory: Negative for cough, shortness of breath, wheezing  Cardiovascular: Negative for chest pain, palpitations and leg swelling.  Gastrointestinal: Negative for heartburn, abdominal pain, vomiting, diarrhea or consitpation Genitourinary: Negative for dysuria, urgency, frequency, hematuria Musculoskeletal: Negative for back pain or joint pain Neurological: Negative for dizziness, seizures, syncope, focal weakness,  numbness and headaches.  Hematological: Does not bruise/bleed easily.  Psychiatric/Behavioral: Negative for hallucinations, confusion, dysphoric mood    Tolerating Diet: yes      DRUG ALLERGIES:   Allergies  Allergen Reactions  . Codeine Other (See Comments)    "TRIPPED OUT"  DIDN'T LIKE THE [place]  . Atorvastatin     muscle pain  . Hydrocodone     itching  . Latex Rash  . Zolpidem Other (See Comments)    Sleep walk    VITALS:  Blood pressure (!) 162/52, pulse 100, temperature 98.3 F (36.8 C), temperature source Oral, resp. rate 19, height 5\' 2"  (1.575 m), weight 114.3 kg, SpO2 98 %.  PHYSICAL EXAMINATION:  Constitutional: Appears well-developed and well-nourished. No distress. HENT: Normocephalic. Marland Kitchen Oropharynx is clear and moist.  Eyes: Conjunctivae and EOM are normal. PERRLA, no scleral icterus.  Neck: Normal ROM. Neck supple. No JVD. No tracheal deviation. CVS: RRR, S1/S2 +, no murmurs, no gallops, no carotid bruit.  Pulmonary: Effort and breath sounds normal, no stridor, rhonchi, wheezes, rales.  Abdominal:  Soft. BS +,  no distension, tenderness, rebound or guarding.  Musculoskeletal: Normal range of motion. No edema and no tenderness.  Neuro: Alert. CN 2-12 grossly intact. No focal deficits. Skin: Skin is warm and dry. No rash noted. Psychiatric: Normal mood and affect.      LABORATORY PANEL:   CBC Recent Labs  Lab 04/02/19 1743  WBC 9.2  HGB 11.8*  HCT 35.3*  PLT 254   ------------------------------------------------------------------------------------------------------------------  Chemistries  Recent Labs  Lab 04/02/19 1743  NA 141  K 4.2  CL 103  CO2 24  GLUCOSE 109*  BUN 45*  CREATININE 1.35*  CALCIUM 9.6  AST 29  ALT 32  ALKPHOS 77  BILITOT 0.6   ------------------------------------------------------------------------------------------------------------------  Cardiac Enzymes No results for input(s): TROPONINI in the last 168 hours. ------------------------------------------------------------------------------------------------------------------  RADIOLOGY:  Ct Head Wo Contrast  Result Date: 04/03/2019 CLINICAL DATA:  Acute headache, worst of life EXAM: CT HEAD WITHOUT CONTRAST TECHNIQUE: Contiguous axial images were obtained from the base of the skull through the vertex without intravenous contrast. COMPARISON:  Brain MRI from yesterday FINDINGS: Brain: No evidence of acute infarction, hemorrhage, hydrocephalus, extra-axial collection or mass lesion/mass effect. Vascular: Atherosclerotic calcification Skull: Normal. Negative for fracture or focal lesion. Sinuses/Orbits: No acute finding. IMPRESSION: Negative head CT. Electronically Signed   By: Monte Fantasia M.D.   On: 04/03/2019 06:54   Mr Brain Wo Contrast  Result Date: 04/02/2019 CLINICAL DATA:  Stroke follow-up.  Left-sided weakness and numbness. EXAM: MRI HEAD WITHOUT CONTRAST TECHNIQUE: Multiplanar, multiecho pulse sequences of the brain and surrounding structures were obtained without intravenous  contrast. COMPARISON:  Head CT 04/02/2019 FINDINGS: BRAIN: There is  no acute infarct, acute hemorrhage or extra-axial collection. The white matter signal is normal for the patient's age. The cerebral and cerebellar volume are age-appropriate. There is no hydrocephalus. Susceptibility-sensitive sequences show no chronic microhemorrhage or superficial siderosis. The midline structures are normal. There is no midline shift or mass effect. VASCULAR: The major intracranial arterial and venous sinus flow voids are normal. SKULL AND UPPER CERVICAL SPINE: Calvarial bone marrow signal is normal. There is no skull base mass. The visualized upper cervical spine and soft tissues are normal. SINUSES/ORBITS: There are no fluid levels or advanced mucosal thickening. The mastoid air cells and middle ear cavities are free of fluid. The orbits are normal. IMPRESSION: Normal brain. Electronically Signed   By: Ulyses Jarred M.D.   On: 04/02/2019 22:49   Ct Head Code Stroke Wo Contrast  Result Date: 04/02/2019 CLINICAL DATA:  Code stroke. Numbness and tingling, paresthesia. Left-sided weakness EXAM: CT HEAD WITHOUT CONTRAST TECHNIQUE: Contiguous axial images were obtained from the base of the skull through the vertex without intravenous contrast. COMPARISON:  None. FINDINGS: Brain: No evidence of acute infarction, hemorrhage, hydrocephalus, extra-axial collection or mass lesion/mass effect. Vascular: Negative for hyperdense vessel Skull: Negative Sinuses/Orbits: Negative Other: None ASPECTS ([place] Stroke Program Early CT Score) - Ganglionic level infarction (caudate, lentiform nuclei, internal capsule, insula, M1-M3 cortex): 7 - Supraganglionic infarction (M4-M6 cortex): 3 Total score (0-10 with 10 being normal): 10 IMPRESSION: 1. No acute abnormality 2. ASPECTS is 10 3. These results were called by telephone at the time of interpretation on 04/02/2019 at 5:39 pm to Dr. Nance Pear , who verbally acknowledged these results.  Electronically Signed   By: Franchot Gallo M.D.   On: 04/02/2019 17:40     ASSESSMENT AND PLAN:   68 year old female with depression who presented to the emergency room with left sided weakness status post TPA  1.  Left-sided weakness/numbness: Patient is status post TPA.  MRI shows no acute infarct.  As per neurology clinical presentation is highly unlikely related to ischemic stroke given negative MRI and still with ongoing symptoms. Continue aspirin  2.  Depression:  Psychiatry consultation placed via epic Continue Cymbalta 3.  Hypothyroid: Continue Synthroid 4.  Diabetes: Continue sliding scale     Management plans discussed with the patient and she is in agreement.  CODE STATUS: full  TOTAL TIME TAKING CARE OF THIS PATIENT: 20 minutes.     POSSIBLE D/C tomorrow, DEPENDING ON CLINICAL CONDITION.   Bettey Costa M.D on 04/03/2019 at 12:18 PM  Between 7am to 6pm - Pager - [phone] After 6pm go to [email or web address] - password [email or web address] [facility]  Office  [phone]  CC: Primary care physician; Steele Sizer, MD  Note: This dictation was prepared with Dragon dictation along with smaller phrase technology. Any transcriptional errors that result from this process are unintentional.

## 2019-04-03 NOTE — Progress Notes (Signed)
S/P tPA for what was thought to represent an acute CVA.  At this point, the current thinking is that this was not likely an acute ischemic event.  She has had no complications related to the TPA.  She needs to physically remain in the ICU/SDU until 24 hours after TPA administration (6 PM this evening).  I have changed her to MedSurg status and PCCM will sign off.  Merton Border, MD PCCM service Mobile [phone] Pager [phone] 04/03/2019 12:26 PM

## 2019-04-04 ENCOUNTER — Inpatient Hospital Stay: Payer: Medicare HMO

## 2019-04-04 DIAGNOSIS — F331 Major depressive disorder, recurrent, moderate: Secondary | ICD-10-CM | POA: Diagnosis present

## 2019-04-04 LAB — HIV ANTIBODY (ROUTINE TESTING W REFLEX): HIV Screen 4th Generation wRfx: NONREACTIVE

## 2019-04-04 LAB — GLUCOSE, CAPILLARY
Glucose-Capillary: 209 mg/dL — ABNORMAL HIGH (ref 70–99)
Glucose-Capillary: 354 mg/dL — ABNORMAL HIGH (ref 70–99)

## 2019-04-04 LAB — NOVEL CORONAVIRUS, NAA (HOSP ORDER, SEND-OUT TO REF LAB; TAT 18-24 HRS): SARS-CoV-2, NAA: NOT DETECTED

## 2019-04-04 MED ORDER — DULOXETINE HCL 20 MG PO CPEP: 20.0000 mg | ORAL_CAPSULE | Freq: Every day | ORAL | Status: DC

## 2019-04-04 MED ORDER — SERTRALINE HCL 25 MG PO TABS: 25.0000 mg | ORAL_TABLET | Freq: Every day | ORAL | 0 refills | Status: DC

## 2019-04-04 MED ORDER — DULOXETINE HCL 30 MG PO CPEP: 30.0000 mg | ORAL_CAPSULE | Freq: Every day | ORAL | 3 refills | Status: DC

## 2019-04-04 MED ORDER — TRAZODONE HCL 50 MG PO TABS: 50.0000 mg | ORAL_TABLET | Freq: Every evening | ORAL | 0 refills | Status: DC | PRN

## 2019-04-04 MED ORDER — SERTRALINE HCL 50 MG PO TABS: 50.0000 mg | ORAL_TABLET | Freq: Every day | ORAL | Status: DC

## 2019-04-04 NOTE — Evaluation (Signed)
Physical Therapy Evaluation Patient Details Name: Summer [name] MRN: [id number] DOB: [birth year] Today's Date: 04/04/2019   History of Present Illness  PT is a 68 yo female with a PMH of OSA, Hypothyroidism, Allergic Rhinitis, Obesity, Memory Loss, Insomnia, HTN, Hyperlipidemia, Hiatal Hernia, Headache, GERD, Type II Diabetes Mellitus, Depressive Disorder, CKD, Asthma, Arthritis, and Anemia.  She presented to [facility] ER on 07/22 with c/o weakness and numbness of the LUE, LLL, and left face, stroke code called, administered TPA 7/22. MRI negative for acute infarction.    Clinical Impression  Patient alert, oriented, behavior WFLs, sitting EOB with OT at start of session. Able to provide clear PLOF; previously independent with assistive devices. Pt reported some remaining L UE/cervical pain, unsure of why she has been experiencing this pain.   Patient demonstrated coordination and sensation WFLs for UE and LE. Pt strength assessed, grossly 4/5 throughout. Pt transferred with CGA and RW, and ambulated ~13ft with RW and CGA/supervision. Pt exhibited increased SOB and complained of fatigue at end of mobility, PT and pt discussed importance of activity pacing, PLB, and modifications to address these limitations. SpO2 and HR monitored, exhibited elevated HR but spO2 WFLs with PLB. Overall the patient demonstrated near return to baseline functioning but would benefit from further skilled PT intervention to maximize safety, mobility, and endurance.    Follow Up Recommendations Home health PT    Equipment Recommendations  None recommended by PT(Pt has 4 wheeled walker, WC, scooter)    Recommendations for Other Services       Precautions / Restrictions Precautions Precautions: Fall Restrictions Weight Bearing Restrictions: No      Mobility  Bed Mobility               General bed mobility comments: Pt sitting EOB at start of session  Transfers Overall transfer level: Needs  assistance Equipment used: Rolling walker (2 wheeled) Transfers: Sit to/from Stand Sit to Stand: Supervision            Ambulation/Gait Ambulation/Gait assistance: Supervision Gait Distance (Feet): 160 Feet Assistive device: Rolling walker (2 wheeled)       General Gait Details: SOB with exertion, elevated HR with ambulation, instructed in activity pacing and PLB to address deficits in endurance. Reciprical gait pattern with short strides, grossly WFLs  Stairs            Wheelchair Mobility    Modified Rankin (Stroke Patients Only)       Balance Overall balance assessment: Needs assistance Sitting-balance support: Feet supported Sitting balance-Leahy Scale: Good       Standing balance-Leahy Scale: Fair                               Pertinent Vitals/Pain Pain Assessment: No/denies pain    Home Living Family/patient expects to be discharged to:: Private residence Living Arrangements: Spouse/significant other Available Help at Discharge: Family Type of Home: House Home Access: Ramped entrance     Home Layout: One level Home Equipment: Toilet riser;Walker - 4 wheels;Shower seat - built in;Hand held shower head;Wheelchair - Press photographer      Prior Function Level of Independence: Independent with assistive device(s)               Hand Dominance   Dominant Hand: Right    Extremity/Trunk Assessment   Upper Extremity Assessment Upper Extremity Assessment: Overall WFL for tasks assessed(no sensation or coordination deficits noted)    Lower Extremity  Assessment Lower Extremity Assessment: RLE deficits/detail;LLE deficits/detail RLE Deficits / Details: some difficulty with hip flexion due to testing position and body habitus, grossly 4/5 RLE Sensation: WNL RLE Coordination: WNL LLE Deficits / Details: some difficulty with hip flexion due to testing postion and body habitus, grossly 4/5 LLE Sensation: WNL LLE Coordination:  WNL    Cervical / Trunk Assessment Cervical / Trunk Assessment: Normal  Communication   Communication: No difficulties  Cognition Arousal/Alertness: Awake/alert Behavior During Therapy: WFL for tasks assessed/performed Overall Cognitive Status: Within Functional Limits for tasks assessed                                        General Comments      Exercises Other Exercises Other Exercises: Pt educated on activity pacing, PLB to address limitations in activity tolerance   Assessment/Plan    PT Assessment Patient needs continued PT services  PT Problem List Decreased strength;Decreased activity tolerance;Cardiopulmonary status limiting activity       PT Treatment Interventions DME instruction;Therapeutic exercise;Gait training;Stair training;Functional mobility training;Therapeutic activities;Neuromuscular re-education;Patient/family education;Balance training    PT Goals (Current goals can be found in the Care Plan section)  Acute Rehab PT Goals Patient Stated Goal: to go home PT Goal Formulation: With patient Time For Goal Achievement: 04/18/19 Potential to Achieve Goals: Good    Frequency Min 2X/week   Barriers to discharge        Co-evaluation               AM-PAC PT "6 Clicks" Mobility  Outcome Measure Help needed turning from your back to your side while in a flat bed without using bedrails?: A Little Help needed moving from lying on your back to sitting on the side of a flat bed without using bedrails?: A Little Help needed moving to and from a bed to a chair (including a wheelchair)?: A Little Help needed standing up from a chair using your arms (e.g., wheelchair or bedside chair)?: None Help needed to walk in hospital room?: None Help needed climbing 3-5 steps with a railing? : A Little 6 Click Score: 20    End of Session Equipment Utilized During Treatment: Gait belt Activity Tolerance: Patient tolerated treatment well Patient  left: in chair;with call bell/phone within reach;with chair alarm set Nurse Communication: Mobility status PT Visit Diagnosis: Other abnormalities of gait and mobility (R26.89)    Time: 1100-1117 PT Time Calculation (min) (ACUTE ONLY): 17 min   Charges:   PT Evaluation $PT Eval Low Complexity: 1 Low PT Treatments $Therapeutic Exercise: 8-22 mins       Lieutenant Diego PT, DPT 11:34 AM,04/04/19 [phone]

## 2019-04-04 NOTE — TOC Transition Note (Signed)
Transition of Care [facility]) - CM/SW Discharge Note   Patient Details  Name: Summer [name] MRN: [id number] Date of Birth: 1950/09/13  [facility] CM/SW Contact:  Candie Chroman, LCSW Phone Number: 04/04/2019, 11:43 AM   Clinical Narrative: Patient is aware that [facility] will follow up with her and that an RN will not be immediately available. PT evaluated. No DME needs. No further concerns. CSW signing off.    Final next level of care: Home w Home Health Services Barriers to Discharge: Barriers Resolved   Patient Goals and CMS Choice   CMS Medicare.gov Compare Post Acute Care list provided to:: Patient Choice offered to / list presented to : Patient  Discharge Placement                    Patient and family notified of of transfer: 04/04/19  Discharge Plan and Services     Post Acute Care Choice: Home Health          DME Arranged: N/A         HH Arranged: RN, PT, Nurse's Aide [place] Agency: [facility] Date [place]: 04/04/19   Representative spoke with at [place]: [place] ([place]) Interventions     Readmission Risk Interventions No flowsheet data found.

## 2019-04-04 NOTE — Progress Notes (Signed)
Discontinue stroke protocol orders per Dr Benjie Karvonen

## 2019-04-04 NOTE — Progress Notes (Signed)
PT Cancellation Note  Patient Details Name: Summer [name] MRN: [id number] DOB: 1951-05-31   Cancelled Treatment:    Reason Eval/Treat Not Completed: Other (comment)(PT entered room, pt out of room at this time and unavailable. PT will follow up as able)   Lieutenant Diego PT, DPT 10:13 AM,04/04/19 [phone]

## 2019-04-04 NOTE — Progress Notes (Signed)
Patient discharged home per MD order. All discharge instructions given and all questions answered. 

## 2019-04-04 NOTE — Discharge Summary (Signed)
[facility] - [place] [facility]   PATIENT NAME: Summer [name]    MR#:  [id number]  DATE OF BIRTH:  03/16/1951  DATE OF ADMISSION:  04/02/2019 ADMITTING PHYSICIAN: Elizabeth Achieng Ouma, NP  DATE OF DISCHARGE: 04/04/2019  PRIMARY CARE PHYSICIAN: Sowles, Krichna, MD    ADMISSION DIAGNOSIS:  Stroke (cerebrum) ([name]) [I63.9] Left-sided weakness [R53.1]  DISCHARGE DIAGNOSIS:  Principal Problem:   Left arm weakness   SECONDARY DIAGNOSIS:   Past Medical History:  Diagnosis Date  . Anemia   . Arthritis   . Asthma   . Carpal tunnel syndrome   . Chronic kidney disease   . Depressive disorder   . Diabetes mellitus   . Dyspnea   . GERD (gastroesophageal reflux disease)   . Headache   . History of hiatal hernia   . Hyperlipidemia   . Hypertension   . Insomnia   . Lumbago   . Memory loss   . Obesity   . 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)   . Pneumonia   . Rhinitis, allergic   . Rosacea   . Thyroid disease   . Unspecified hearing loss   . Unspecified hereditary and idiopathic peripheral neuropathy   . Unspecified sleep apnea     HOSPITAL COURSE:  68-year-old female with depression who presented to the emergency room with left sided weakness status post TPA  1.  Left-sided weakness/numbness: Patient is status post TPA.  MRI shows no acute infarct.  As per neurology clinical presentation is highly unlikely related to ischemic stroke given negative MRI.  She will continue aspirin Her symptoms have resolved. 2.  Depression:  While in the hospital she was evaluated by psychiatry.  It has been recommended to reduce dose of Cymbalta to 30 mg daily and eventually taper this off.  She will also start on Zoloft 25 mg daily.   3.  Hypothyroid: Continue Synthroid 4.  Diabetes: She will continue ADA diet  DISCHARGE CONDITIONS AND DIET:   Stable for discharge diabetic diet  CONSULTS OBTAINED:    DRUG ALLERGIES:   Allergies  Allergen Reactions  . Codeine  Other (See Comments)    "TRIPPED OUT"  DIDN'T LIKE THE WAY IT FELT  . Atorvastatin     muscle pain  . Hydrocodone     itching  . Latex Rash  . Zolpidem Other (See Comments)    Sleep walk    DISCHARGE MEDICATIONS:   Allergies as of 04/04/2019      Reactions   Codeine Other (See Comments)   "TRIPPED OUT"  DIDN'T LIKE THE WAY IT FELT   Atorvastatin    muscle pain   Hydrocodone    itching   Latex Rash   Zolpidem Other (See Comments)   Sleep walk      Medication List    TAKE these medications   Accu-Chek Aviva Plus w/Device Kit   Accu-Chek Softclix Lancets lancets   acetaminophen 650 MG CR tablet Commonly known as: Tylenol 8 Hour Take 1 tablet (650 mg total) by mouth every 8 (eight) hours as needed for pain.   amLODipine 2.5 MG tablet Commonly known as: NORVASC Take 2.5 mg by mouth daily.   ASPIRIN LOW DOSE 81 MG EC tablet Generic drug: aspirin TAKE 1 TABLET (81 MG TOTAL) BY MOUTH DAILY.   B-D SINGLE USE SWABS REGULAR Pads   DULoxetine 30 MG capsule Commonly known as: CYMBALTA Take 1 capsule (30 mg total) by mouth daily. What changed:   medication strength  how   much to take   Fish Oil 1000 MG Caps Take 1,000 mg by mouth daily.   furosemide 20 MG tablet Commonly known as: LASIX Take 1 tablet (20 mg total) by mouth daily.   Icosapent Ethyl 1 g Caps Commonly known as: Vascepa Take 2 capsules (2 g total) by mouth 2 (two) times daily.   insulin NPH-regular Human (70-30) 100 UNIT/ML injection Inject 100 Units into the skin 2 (two) times daily with a meal.   irbesartan-hydrochlorothiazide 300-12.5 MG tablet Commonly known as: AVALIDE Take 1 tablet by mouth daily.   levothyroxine 25 MCG tablet Commonly known as: SYNTHROID One daily and two on sundays What changed: See the new instructions.   metFORMIN 500 MG 24 hr tablet Commonly known as: GLUCOPHAGE-XR Take 500 mg by mouth. 2 tablets daily (total of 1000 mg daily)   metoprolol tartrate 25 MG  tablet Commonly known as: LOPRESSOR Take 1 tablet (25 mg total) by mouth 2 (two) times daily.   montelukast 10 MG tablet Commonly known as: SINGULAIR Take 1 tablet (10 mg total) by mouth at bedtime.   omeprazole 40 MG capsule Commonly known as: PRILOSEC Take 1 capsule (40 mg total) by mouth daily.   OSTEO BI-FLEX ADV JOINT SHIELD PO Take by mouth 2 (two) times a week.   Potassium 99 MG Tabs Take by mouth.   pregabalin 300 MG capsule Commonly known as: LYRICA Take 300 mg by mouth 2 (two) times a day.   PROBIOTIC-10 PO Take by mouth.   Proventil HFA 108 (90 Base) MCG/ACT inhaler Generic drug: albuterol Inhale 2 puffs into the lungs 2 (two) times daily as needed for shortness of breath.   RELION INSULIN SYRINGE 1ML/31G 31G X 5/16" 1 ML Misc Generic drug: Insulin Syringe-Needle U-100   rosuvastatin 20 MG tablet Commonly known as: CRESTOR Take 1 tablet (20 mg total) by mouth daily.   sertraline 25 MG tablet Commonly known as: ZOLOFT Take 1 tablet (25 mg total) by mouth daily.   traZODone 50 MG tablet Commonly known as: DESYREL Take 1 tablet (50 mg total) by mouth at bedtime as needed for sleep.   VITAMIN B12 PO Take 1 tablet by mouth daily.   vitamin C 500 MG tablet Commonly known as: ASCORBIC ACID Take 500 mg by mouth 2 (two) times daily.   VITAMIN D (CHOLECALCIFEROL) PO Take 1,000 Units by mouth daily.         Today   CHIEF COMPLAINT:  Patient got a sleeping pill early this morning.  No suicidal or homicidal ideations.  Weakness has resolved.  No numbness or tingling.  No focal neurological deficits.   VITAL SIGNS:  Blood pressure (!) 117/44, pulse 85, temperature 97.8 F (36.6 C), temperature source Oral, resp. rate 15, height 5' 2" (1.575 m), weight 114.3 kg, SpO2 94 %.   REVIEW OF SYSTEMS:  Review of Systems  Constitutional: Negative.  Negative for chills, fever and malaise/fatigue.  HENT: Negative.  Negative for ear discharge, ear pain,  hearing loss, nosebleeds and sore throat.   Eyes: Negative.  Negative for blurred vision and pain.  Respiratory: Negative.  Negative for cough, hemoptysis, shortness of breath and wheezing.   Cardiovascular: Negative.  Negative for chest pain, palpitations and leg swelling.  Gastrointestinal: Negative.  Negative for abdominal pain, blood in stool, diarrhea, nausea and vomiting.  Genitourinary: Negative.  Negative for dysuria.  Musculoskeletal: Negative.  Negative for back pain.  Skin: Negative.   Neurological: Negative for dizziness, tremors, speech change, focal  weakness, seizures and headaches.  Endo/Heme/Allergies: Negative.  Does not bruise/bleed easily.  Psychiatric/Behavioral: Negative.  Negative for depression, hallucinations and suicidal ideas.     PHYSICAL EXAMINATION:  GENERAL:  68 y.o.-year-old patient lying in the bed with no acute distress.  NECK:  Supple, no jugular venous distention. No thyroid enlargement, no tenderness.  LUNGS: Normal breath sounds bilaterally, no wheezing, rales,rhonchi  No use of accessory muscles of respiration.  CARDIOVASCULAR: S1, S2 normal. No murmurs, rubs, or gallops.  ABDOMEN: Soft, non-tender, non-distended. Bowel sounds present. No organomegaly or mass.  EXTREMITIES: No pedal edema, cyanosis, or clubbing.  PSYCHIATRIC: The patient is alert and oriented x 3.  SKIN: No obvious rash, lesion, or ulcer.   DATA REVIEW:   CBC Recent Labs  Lab 04/02/19 1743  WBC 9.2  HGB 11.8*  HCT 35.3*  PLT 254    Chemistries  Recent Labs  Lab 04/02/19 1743  NA 141  K 4.2  CL 103  CO2 24  GLUCOSE 109*  BUN 45*  CREATININE 1.35*  CALCIUM 9.6  AST 29  ALT 32  ALKPHOS 77  BILITOT 0.6    Cardiac Enzymes No results for input(s): TROPONINI in the last 168 hours.  Microbiology Results  @[email or web address]@  RADIOLOGY:  Ct Head Wo Contrast  Result Date: 04/03/2019 CLINICAL DATA:  Acute headache, worst of life EXAM: CT HEAD WITHOUT CONTRAST  TECHNIQUE: Contiguous axial images were obtained from the base of the skull through the vertex without intravenous contrast. COMPARISON:  Brain MRI from yesterday FINDINGS: Brain: No evidence of acute infarction, hemorrhage, hydrocephalus, extra-axial collection or mass lesion/mass effect. Vascular: Atherosclerotic calcification Skull: Normal. Negative for fracture or focal lesion. Sinuses/Orbits: No acute finding. IMPRESSION: Negative head CT. Electronically Signed   By: Jonathon  Watts M.D.   On: 04/03/2019 06:54   Mr Brain Wo Contrast  Result Date: 04/02/2019 CLINICAL DATA:  Stroke follow-up.  Left-sided weakness and numbness. EXAM: MRI HEAD WITHOUT CONTRAST TECHNIQUE: Multiplanar, multiecho pulse sequences of the brain and surrounding structures were obtained without intravenous contrast. COMPARISON:  Head CT 04/02/2019 FINDINGS: BRAIN: There is no acute infarct, acute hemorrhage or extra-axial collection. The white matter signal is normal for the patient's age. The cerebral and cerebellar volume are age-appropriate. There is no hydrocephalus. Susceptibility-sensitive sequences show no chronic microhemorrhage or superficial siderosis. The midline structures are normal. There is no midline shift or mass effect. VASCULAR: The major intracranial arterial and venous sinus flow voids are normal. SKULL AND UPPER CERVICAL SPINE: Calvarial bone marrow signal is normal. There is no skull base mass. The visualized upper cervical spine and soft tissues are normal. SINUSES/ORBITS: There are no fluid levels or advanced mucosal thickening. The mastoid air cells and middle ear cavities are free of fluid. The orbits are normal. IMPRESSION: Normal brain. Electronically Signed   By: Kevin  Herman M.D.   On: 04/02/2019 22:49   Ct Head Code Stroke Wo Contrast  Result Date: 04/02/2019 CLINICAL DATA:  Code stroke. Numbness and tingling, paresthesia. Left-sided weakness EXAM: CT HEAD WITHOUT CONTRAST TECHNIQUE: Contiguous  axial images were obtained from the base of the skull through the vertex without intravenous contrast. COMPARISON:  None. FINDINGS: Brain: No evidence of acute infarction, hemorrhage, hydrocephalus, extra-axial collection or mass lesion/mass effect. Vascular: Negative for hyperdense vessel Skull: Negative Sinuses/Orbits: Negative Other: None ASPECTS (Alberta Stroke Program Early CT Score) - Ganglionic level infarction (caudate, lentiform nuclei, internal capsule, insula, M1-M3 cortex): 7 - Supraganglionic infarction (M4-M6 cortex): 3 Total score (0-10 with 10   being normal): 10 IMPRESSION: 1. No acute abnormality 2. ASPECTS is 10 3. These results were called by telephone at the time of interpretation on 04/02/2019 at 5:39 pm to Dr. GRAYDON GOODMAN , who verbally acknowledged these results. Electronically Signed   By: Charles  Clark M.D.   On: 04/02/2019 17:40      Allergies as of 04/04/2019      Reactions   Codeine Other (See Comments)   "TRIPPED OUT"  DIDN'T LIKE THE WAY IT FELT   Atorvastatin    muscle pain   Hydrocodone    itching   Latex Rash   Zolpidem Other (See Comments)   Sleep walk      Medication List    TAKE these medications   Accu-Chek Aviva Plus w/Device Kit   Accu-Chek Softclix Lancets lancets   acetaminophen 650 MG CR tablet Commonly known as: Tylenol 8 Hour Take 1 tablet (650 mg total) by mouth every 8 (eight) hours as needed for pain.   amLODipine 2.5 MG tablet Commonly known as: NORVASC Take 2.5 mg by mouth daily.   ASPIRIN LOW DOSE 81 MG EC tablet Generic drug: aspirin TAKE 1 TABLET (81 MG TOTAL) BY MOUTH DAILY.   B-D SINGLE USE SWABS REGULAR Pads   DULoxetine 30 MG capsule Commonly known as: CYMBALTA Take 1 capsule (30 mg total) by mouth daily. What changed:   medication strength  how much to take   Fish Oil 1000 MG Caps Take 1,000 mg by mouth daily.   furosemide 20 MG tablet Commonly known as: LASIX Take 1 tablet (20 mg total) by mouth daily.    Icosapent Ethyl 1 g Caps Commonly known as: Vascepa Take 2 capsules (2 g total) by mouth 2 (two) times daily.   insulin NPH-regular Human (70-30) 100 UNIT/ML injection Inject 100 Units into the skin 2 (two) times daily with a meal.   irbesartan-hydrochlorothiazide 300-12.5 MG tablet Commonly known as: AVALIDE Take 1 tablet by mouth daily.   levothyroxine 25 MCG tablet Commonly known as: SYNTHROID One daily and two on sundays What changed: See the new instructions.   metFORMIN 500 MG 24 hr tablet Commonly known as: GLUCOPHAGE-XR Take 500 mg by mouth. 2 tablets daily (total of 1000 mg daily)   metoprolol tartrate 25 MG tablet Commonly known as: LOPRESSOR Take 1 tablet (25 mg total) by mouth 2 (two) times daily.   montelukast 10 MG tablet Commonly known as: SINGULAIR Take 1 tablet (10 mg total) by mouth at bedtime.   omeprazole 40 MG capsule Commonly known as: PRILOSEC Take 1 capsule (40 mg total) by mouth daily.   OSTEO BI-FLEX ADV JOINT SHIELD PO Take by mouth 2 (two) times a week.   Potassium 99 MG Tabs Take by mouth.   pregabalin 300 MG capsule Commonly known as: LYRICA Take 300 mg by mouth 2 (two) times a day.   PROBIOTIC-10 PO Take by mouth.   Proventil HFA 108 (90 Base) MCG/ACT inhaler Generic drug: albuterol Inhale 2 puffs into the lungs 2 (two) times daily as needed for shortness of breath.   RELION INSULIN SYRINGE 1ML/31G 31G X 5/16" 1 ML Misc Generic drug: Insulin Syringe-Needle U-100   rosuvastatin 20 MG tablet Commonly known as: CRESTOR Take 1 tablet (20 mg total) by mouth daily.   sertraline 25 MG tablet Commonly known as: ZOLOFT Take 1 tablet (25 mg total) by mouth daily.   traZODone 50 MG tablet Commonly known as: DESYREL Take 1 tablet (50 mg total) by mouth   at bedtime as needed for sleep.   VITAMIN B12 PO Take 1 tablet by mouth daily.   vitamin C 500 MG tablet Commonly known as: ASCORBIC ACID Take 500 mg by mouth 2 (two) times  daily.   VITAMIN D (CHOLECALCIFEROL) PO Take 1,000 Units by mouth daily.         Management plans discussed with the patient and she is in agreement. Stable for discharge home  Patient should follow up with pcp  CODE STATUS:     Code Status Orders  (From admission, onward)         Start     Ordered   04/04/19 0915  Do not attempt resuscitation (DNR)  Continuous    Question Answer Comment  In the event of cardiac or respiratory ARREST Do not call a "code blue"   In the event of cardiac or respiratory ARREST Do not perform Intubation, CPR, defibrillation or ACLS   In the event of cardiac or respiratory ARREST Use medication by any route, position, wound care, and other measures to relive pain and suffering. May use oxygen, suction and manual treatment of airway obstruction as needed for comfort.      04/04/19 0914        Code Status History    Date Active Date Inactive Code Status Order ID Comments User Context   04/02/2019 1858 04/04/2019 0914 Full Code [id number]  Lang Snow, NP ED   Advance Care Planning Activity      TOTAL TIME TAKING CARE OF THIS PATIENT: 38 minutes.    Note: This dictation was prepared with Dragon dictation along with smaller phrase technology. Any transcriptional errors that result from this process are unintentional.  Bettey Costa M.D on 04/04/2019 at 11:06 AM  Between 7am to 6pm - Pager - [phone] After 6pm go to [email or web address] - password [email or web address] [facility]  Office  [phone]  CC: Primary care physician; Steele Sizer, MD

## 2019-04-04 NOTE — Evaluation (Signed)
Occupational Therapy Evaluation Patient Details Name: Summer [name] MRN: [id number] DOB: 06/16/1951 Today's Date: 04/04/2019    History of Present Illness PT is a 68 yo female with a PMH of OSA, Hypothyroidism, Allergic Rhinitis, Obesity, Memory Loss, Insomnia, HTN, Hyperlipidemia, Hiatal Hernia, Headache, GERD, Type II Diabetes Mellitus, Depressive Disorder, CKD, Asthma, Arthritis, and Anemia.  She presented to [facility] ER on 07/22 with c/o weakness and numbness of the LUE, LLL, and left face, stroke code called, administered TPA 7/22. MRI negative for acute infarction.   Clinical Impression   Pt seen for OT evaluation this date. Prior to hospital admission, pt was independent in all aspects of ADL/IADL, using a 4WW for community mobility, and denies falls history in past 6 months. Pt lives with her partner in a 1 level home with ramped entrance. Currently pt reporting symptoms have generally resolved. Pt completed toilet transfer on this date with supervision to [place] and VCs for safety.  Pt requires min assist for LB dressing in a seated position. Pt would benefit from skilled OT to address noted impairments and functional limitations (see below for any additional details) in order to maximize safety and independence while minimizing falls risk and caregiver burden.  Upon hospital discharge, recommend HHOT to support safety and return to PLOF within pt natural context.     Follow Up Recommendations  Home health OT    Equipment Recommendations  None recommended by OT    Recommendations for Other Services       Precautions / Restrictions Precautions Precautions: Fall Restrictions Weight Bearing Restrictions: No      Mobility Bed Mobility Overal bed mobility: Modified Independent             General bed mobility comments: Pt able to complete independently with HOB elevated and increased time/effort.  Transfers Overall transfer level: Needs assistance Equipment used: Rolling walker (2  wheeled) Transfers: Sit to/from Stand Sit to Stand: Supervision              Balance Overall balance assessment: Needs assistance Sitting-balance support: Feet supported Sitting balance-Leahy Scale: Good Sitting balance - Comments: Steady reaching within BOS. No LOB during lateral leans for peri care on this date.     Standing balance-Leahy Scale: Fair                             ADL either performed or assessed with clinical judgement   ADL Overall ADL's : Needs assistance/impaired Eating/Feeding: Independent;Sitting   Grooming: Standing;Modified independent;Sitting   Upper Body Bathing: Supervision/ safety;Sitting;With adaptive equipment   Lower Body Bathing: Sitting/lateral leans;With adaptive equipment;Supervison/ safety   Upper Body Dressing : Sitting;Supervision/safety   Lower Body Dressing: Sit to/from stand;Minimal assistance;With adaptive equipment   Toilet Transfer: Min guard;Set up;RW;Cueing for sequencing;Cueing for safety   Toileting- Clothing Manipulation and Hygiene: Supervision/safety;Set up;Sitting/lateral lean       Functional mobility during ADLs: Supervision/safety;Cueing for safety;Rolling walker       Vision Baseline Vision/History: Wears glasses Wears Glasses: Reading only Patient Visual Report: No change from baseline       Perception     Praxis      Pertinent Vitals/Pain Pain Assessment: No/denies pain     Hand Dominance Right   Extremity/Trunk Assessment Upper Extremity Assessment Upper Extremity Assessment: Overall WFL for tasks assessed(WFL t/o. No sensation or coordination deficits appreciated with assessment.)   Lower Extremity Assessment Lower Extremity Assessment: Overall WFL for tasks assessed;Defer to PT  evaluation RLE Deficits / Details: some difficulty with hip flexion due to testing position and body habitus, grossly 4/5 RLE Sensation: WNL RLE Coordination: WNL LLE Deficits / Details: some difficulty  with hip flexion due to testing postion and body habitus, grossly 4/5 LLE Sensation: WNL LLE Coordination: WNL   Cervical / Trunk Assessment Cervical / Trunk Assessment: Normal   Communication Communication Communication: No difficulties   Cognition Arousal/Alertness: Awake/alert Behavior During Therapy: WFL for tasks assessed/performed Overall Cognitive Status: Within Functional Limits for tasks assessed                                     General Comments       Exercises  Other Exercises: Pt educated on safe transfer strategies during toilet transfer on this date. OT provided set-up assist of bari-commode and supervision for safety.   Shoulder Instructions      Home Living Family/patient expects to be discharged to:: Private residence Living Arrangements: Spouse/significant other Available Help at Discharge: Family Type of Home: House Home Access: [place]: One level     Bathroom Shower/Tub: Teacher, early years/pre: [place]: Toilet riser;Walker - 4 wheels;Shower seat - built in;Hand held shower head;Wheelchair - Press photographer          Prior Functioning/Environment Level of Independence: Independent with assistive device(s)                 OT Problem List: Decreased strength;Decreased coordination;Decreased activity tolerance;Decreased safety awareness;Decreased knowledge of use of DME or AE;Impaired balance (sitting and/or standing)      OT Treatment/Interventions: Self-care/ADL training;Therapeutic exercise;Therapeutic activities;Energy conservation;Patient/family education;Balance training    OT Goals(Current goals can be found in the care plan section) Acute Rehab OT Goals Patient Stated Goal: to go home OT Goal Formulation: With patient Time For Goal Achievement: 04/18/19 Potential to Achieve Goals: Good ADL Goals Pt Will Perform Upper Body Dressing: with modified  independence;sitting;with adaptive equipment(With LRAD PRN for improved safety.) Pt Will Perform Lower Body Dressing: sit to/from stand;with adaptive equipment;with modified independence(With LRAD PRN for improved safety.) Additional ADL Goal #1: Pt will independently verbalize a plan to implement at least 3 learned falls prevention strategies upon hospital DC for improved safety during daily routines and meaningful occupations of daily life.  OT Frequency: Min 1X/week   Barriers to D/C: Decreased caregiver support  Boyfriend works during the day.       Co-evaluation              AM-PAC OT "6 Clicks" Daily Activity     Outcome Measure Help from another person eating meals?: None Help from another person taking care of personal grooming?: A Little Help from another person toileting, which includes using toliet, bedpan, or urinal?: A Little Help from another person bathing (including washing, rinsing, drying)?: A Little Help from another person to put on and taking off regular upper body clothing?: A Little Help from another person to put on and taking off regular lower body clothing?: A Little 6 Click Score: 19   End of Session Equipment Utilized During Treatment: Rolling walker;Gait belt  Activity Tolerance: Patient tolerated treatment well Patient left: Other (comment)(Standing with PT to start session.)  OT Visit Diagnosis: Other abnormalities of gait and mobility (R26.89)  Time: 1044-1100 OT Time Calculation (min): 16 min Charges:  OT General Charges $OT Visit: 1 Visit OT Evaluation $OT Eval Low Complexity: 1 Low OT Treatments $Self Care/Home Management : 8-22 mins  Shara Blazing, M.S., OTR/L Ascom: [phone] 04/04/19, 12:44 PM

## 2019-04-04 NOTE — TOC Initial Note (Signed)
Transition of Care [facility]) - Initial/Assessment Note    Patient Details  Name: Summer [name] MRN: [id number] Date of Birth: 01-08-1951  Transition of Care [facility] CM/SW Contact:    Candie Chroman, LCSW Phone Number: 04/04/2019, 10:54 AM  Clinical Narrative:  CSW met with patient, introduced role, and explained that discharge planning would be discussed. Patient has orders for home health PT, RN, and aide. She has no agency preference. It has been years since she last worked with home health. She gave CSW permission to make referral starting with top rated agencies. Referral made to [facility] and accepted. They are training new nurses at this time so one will not be available immediately. If she is in need of one they will of course send one out but will not until their new nurses are trained if she is stable. Will notify patient when she has finished working with therapy.   Expected Discharge Plan: [place] Barriers to Discharge: Barriers Resolved   Patient Goals and CMS Choice   CMS Medicare.gov Compare Post Acute Care list provided to:: Patient    Expected Discharge Plan and Services Expected Discharge Plan: [place] Choice: [place] arrangements for the past 2 months: Single Family Home Expected Discharge Date: 04/04/19                         HH Arranged: RN, PT, Nurse's Aide HH Agency: [facility] Date [place]: 04/04/19   Representative spoke with at [place]: [place] Arrangements/Services Living arrangements for the past 2 months: [place]   Patient language and need for interpreter reviewed:: Yes Do you feel safe going back to the place where you live?: Yes      Need for Family Participation in Patient Care: Yes (Comment) Care giver support system in place?: Yes (comment)   Criminal Activity/Legal Involvement Pertinent to Current Situation/Hospitalization: No  - Comment as needed  Activities of Daily Living      Permission Sought/Granted Permission sought to share information with : Facility Art therapist granted to share information with : Yes, Verbal Permission Granted     Permission granted to share info w AGENCY: Home Health Agencies        Emotional Assessment Appearance:: Appears stated age Attitude/Demeanor/Rapport: Engaged, Gracious Affect (typically observed): Accepting, Appropriate, Calm, Pleasant Orientation: : Oriented to Self, Oriented to Place, Oriented to  Time, Oriented to Situation Alcohol / Substance Use: Never Used Psych Involvement: Yes (comment)  Admission diagnosis:  Stroke (cerebrum) ([name]) [I63.9] Left-sided weakness [R53.1] Patient Active Problem List   Diagnosis Date Noted  . Left arm weakness 04/03/2019  . (HFpEF) heart failure with preserved ejection fraction ([place]) 08/31/2017  . Moderate persistent asthma 08/31/2017  . Hyperlipidemia due to type 2 diabetes mellitus ([place]) 06/14/2017  . Essential hypertension 10/06/2016  . Morbid obesity with BMI of 40.0-44.9, adult ([place]) 10/06/2016  . Dyslipidemia associated with type 2 diabetes mellitus ([place]) 08/18/2016  . Charcot foot due to diabetes mellitus ([place]) 02/22/2016  . Hypertriglyceridemia 07/27/2015  . Acquired abduction deformity of foot 07/12/2015  . Osteoarthritis of subtalar joint 07/12/2015  . Poorly controlled type 2 diabetes mellitus with neuropathy ([place]) 07/12/2015  . Arthritis of foot, degenerative 07/12/2015  . Carpal tunnel syndrome 04/17/2015  . Chronic constipation 04/17/2015  . Insomnia, persistent 04/17/2015  . Chronic kidney disease (CKD), stage  III (moderate) ([place]) 04/17/2015  . Decreased exercise tolerance 04/17/2015  . Diabetes mellitus with polyneuropathy ([place]) 04/17/2015  . Gastro-esophageal reflux disease without esophagitis 04/17/2015  . Bursitis, trochanteric 04/17/2015  . Cephalalgia 04/17/2015  . Benign  hypertension 04/17/2015  . Adult hypothyroidism 04/17/2015  . Hearing loss 04/17/2015  . Chronic recurrent major depressive disorder ([place]) 04/17/2015  . Neurogenic claudication 04/17/2015  . Obesity (BMI 35.0-39.9 without comorbidity) 04/17/2015  . Hypo-ovarianism 04/17/2015  . Perennial allergic rhinitis with seasonal variation 04/17/2015  . Acne erythematosa 04/17/2015  . Dyskinesia, tardive 04/17/2015  . Memory loss 04/17/2015  . Impingement syndrome of shoulder 04/17/2015  . Dermatitis, stasis 04/17/2015  . Obstructive sleep apnea 05/14/2014  . Dyspnea 05/06/2014  . Hyperlipidemia 02/06/2012  . LBP (low back pain) 09/16/2008   PCP:  Steele Sizer, MD Pharmacy:   [facility], [address] [place] [address] Phone: [phone] Fax: [phone]  [place], [place] - [address] [place] [address] Phone: [phone] Fax: [phone]     Social Determinants of Health (SDOH) Interventions    Readmission Risk Interventions No flowsheet data found.

## 2019-04-04 NOTE — Progress Notes (Signed)
OT Cancellation Note  Patient Details Name: Summer [name] MRN: [id number] DOB: [birth year]   Cancelled Evaluation:    Reason Eval/Treat Not Completed: Fatigue/lethargy limiting ability to participate. Thank you for the OT consult. Order received and chart reviewed. Spoke with pt RN who stated active bedrest order could be DC'd at this time and pt was appropriate for OT evaluation. Upon arrival to pt room, pt supine in bed asleep. Pt awoke briefly with multimodal cueing, but was unable to remain awake/alert. Will re-attempt at a later time/date as available and pt medically appropriate for OT eval.  Shara Blazing, M.S., OTR/L Ascom: [phone] 04/04/19, 9:06 AM

## 2019-04-04 NOTE — Progress Notes (Signed)
Family Meeting Note  Advance Directive:no  Today a meeting took place with the Patient.   The following clinical team members were present during this meeting:MD  The following were discussed:Patient's diagnosis: depression, Patient's progosis: > 12 months and Goals for treatment: DNR  Additional follow-up to be provided: DNR changed per wishes of patient in chart and yellow sheet signed  Time spent during discussion:16 minutes  Bettey Costa, MD

## 2019-04-04 NOTE — Discharge Instructions (Signed)
[place] for therapy request  Psychiatrist in [place], [place] Address: [address], [place], [place] [address] Hours: Caesar Bookman Phone: [phone]

## 2019-04-04 NOTE — Consult Note (Signed)
[facility] Consult   Reason for Consult: Assess depression or anxiety Referring Physician: ICU physician Patient Identification: Summer [name] MRN:  [id number] Principal Diagnosis: Left arm weakness Diagnosis:  Principal Problem:   Left arm weakness  Total Time spent with patient: 45 minutes  Subjective:   Summer [name] is a 68 y.o. female patient admitted with history of depression and recent left arm weakness thought to be CVA, but negative work-up. "I'm feeling so-so."  Denies physical pain, does endorse frustration with her life the past four years and inability to do or go anywhere.  Spends most of her time in her bedroom due to limited movement r/t to her ankle surgery and rehabs.  Denies suicidal/homicidal ideations, hallucinations, and substance abuse.  Patient was seen and examined in person.  Listened to her recent history and stressors.  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  She did buy if back a few years ago with new clients who became like another family.  Then, four years ago she hurt her ankle and it progressed to her having to have surgery two years ago with limited ambulation afterwards.  Meanwhile, due to her immobility, she continued eating and gaining weight which interfered with her breathing and mobility.  She is scheduled for gastric bypass in August but unsure if this is going to happen.  Expressed frustration about not able to care for her flowers, clean her house, and other activities.  She does have her boyfriend who lives with her and works, sole provider which she feels guilty about as she only has Fish farm [employer] for income.  Worries about finances and feels she is having trouble "bouncing" back.  Her boyfriend does not like talking about things.  She does have a sister who is supportive and spoke with her on the phone.  Expressed gratitude over having  this practitioner listening to her. Did try to direct her thoughts to her goal of returning to care for her flowers in her yard as she hopes to lose weight and increase mobility.  Hoping she can return to some normalcy in the spring after recovery.  Interested in therapy to talk to someone but not psychiatry, resources provided.    HPI: Per ED and previous notes:  Patient is a 68 year old female with a past medical history significant for anemia, chronic renal disease, supposedly depression, diabetes mellitus type 2, GERD, hypertension, hyperlipidemia, insomnia, memory loss, obesity, thyroid disease and unspecified neuropathy who presented to the [facility] emergency room on 7/22 in an emergent situation with sudden tingling and weakness to the left side of her body.  At the time of evaluation her lips were reportedly numb.  She stated that the symptoms had started 30 minutes prior to evaluation.  She also reported a headache at that time.  She was evaluated by neurology who treated the patient with TPA.  She was admitted to the intensive care unit.  She was noted to be oriented x4, and did not demonstrate any memory deficits.  Her initial NIH stroke scale score was 4.  Code stroke was initiated as mentioned above.  An MRI was obtained which was essentially normal.  Echocardiogram showed an ejection fraction of greater than 65%.  Wall motion in the left ventricle was thought to be normal.  Right ventricle had normal systolic function.  Mitral valve was degenerate, and  there was mild thickening of the mitral valve leaflet.  There was moderate mitral annular calcification.  Aortic valve had an indeterminate number of cusps, the aortic itself was felt to be normal in size and structure.  The interatrial septum was not well visualized.  The patient was noted to have a history of depression, and was reportedly taken duloxetine.  On interview today the patient stated she was unaware that  she was still taking antidepressant medication if she was.  She stated that most recently she had had no new stressors.  The last major stressor was in February of this year.  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  She denied any history of alcohol or substance issues.  She stated since then they had been financial stressors.  She denied any helplessness, hopelessness or worthlessness.  She did admit to some anxiety.  She denied any previous psychiatric admissions.  She was unable to remember any other psychiatric medicine she may have taken in the past.  She denied any significant anxiety provoking issues yesterday.  Her last outpatient visit at the [facility] system on 12/19/2018 at the [place] [facility] showed that she was still taking the duloxetine.  At least it shows a 60 mg dose at that time.  The admission physicians continued the Cymbalta at this time at 60 mg.  She had previously taken doxepin for sleep, but was no longer taking that.  It does not appear as though the Cymbalta was beneficial.  Past Psychiatric History: Patient stated that she had not seen a psychiatrist in many years.  She was taking Cymbalta recently for that, but did not realize she was still taking it.  She also had previously been taking doxepin as well as amitriptyline.  Both of those were not active.  She was unable to remember any other psychiatric medication she may have been taking.  No previous psychiatric admissions.  Risk to Self:  none Risk to Others:  none Prior Inpatient Therapy:  none Prior Outpatient Therapy:  years ago  Past Medical History:  Past Medical History:  Diagnosis Date  . Anemia   . Arthritis   . Asthma   . Carpal tunnel syndrome   . Chronic kidney disease   . Depressive disorder   . Diabetes mellitus   . Dyspnea   . GERD (gastroesophageal reflux disease)   . Headache   . History of hiatal hernia   . Hyperlipidemia   . Hypertension   . Insomnia   .  Lumbago   . Memory loss   . Obesity   . 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)   . Pneumonia   . Rhinitis, allergic   . Rosacea   . Thyroid disease   . Unspecified hearing loss   . Unspecified hereditary and idiopathic peripheral neuropathy   . Unspecified sleep apnea     Past Surgical History:  Procedure Laterality Date  . ANKLE SURGERY Left approx Jan 2018  . CATARACT EXTRACTION  01/2011   right  . eye lid surgery  2013   bilateral  . FOOT SURGERY    . NECK SURGERY    . TUBAL LIGATION    . VAGINAL HYSTERECTOMY  1989   Family History:  Family History  Problem Relation Age of Onset  . Heart attack Mother   . Aneurysm Mother   . Heart attack Maternal Grandfather   . Diabetes Maternal Grandfather    Family Psychiatric  History: Noncontributory  Social History:  Social History   Substance and Sexual Activity  Alcohol Use No  . Alcohol/week: 0.0 standard drinks  . Frequency: Never     Social History   Substance and Sexual Activity  Drug Use No    Social History   Socioeconomic History  . Marital status: Significant Other    Spouse name: Not on file  . Number of children: 1  . Years of education: Not on file  . Highest education level: High school graduate  Occupational History  . Occupation: retired  Scientific laboratory technician  . Financial resource strain: Not hard at all  . Food insecurity    Worry: Never true    Inability: Never true  . Transportation needs    Medical: No    Non-medical: No  Tobacco Use  . Smoking status: Never Smoker  . Smokeless tobacco: Never Used  Substance and Sexual Activity  . Alcohol use: No    Alcohol/week: 0.0 standard drinks    Frequency: Never  . Drug use: No  . Sexual activity: Yes    Partners: Male  Lifestyle  . Physical activity    Days per week: 0 days    Minutes per session: 0 min  . Stress: Rather much  Relationships  . Social connections    Talks on phone: More than three times a week    Gets together: Once a week    Attends  religious service: Never    Active member of club or organization: No    Attends meetings of clubs or organizations: Never    Relationship status: Living with partner  Other Topics Concern  . Not on file  Social History Narrative   Son lives in [place] (retired from [employer]) and her granddaughter (73yrs old) lives in [place] with her mother.   Additional Social History:    Allergies:   Allergies  Allergen Reactions  . Codeine Other (See Comments)    "TRIPPED OUT"  DIDN'T LIKE THE [place]  . Atorvastatin     muscle pain  . Hydrocodone     itching  . Latex Rash  . Zolpidem Other (See Comments)    Sleep walk    Labs:  Results for orders placed or performed during the hospital encounter of 04/02/19 (from the past 48 hour(s))  Glucose, capillary     Status: Abnormal   Collection Time: 04/02/19  5:28 PM  Result Value Ref Range   Glucose-Capillary 112 (H) 70 - 99 mg/dL   Comment 1 Notify RN    Comment 2 Document in Chart   Protime-INR     Status: None   Collection Time: 04/02/19  5:43 PM  Result Value Ref Range   Prothrombin Time 13.0 11.4 - 15.2 seconds   INR 1.0 0.8 - 1.2    Comment: (NOTE) INR goal varies based on device and disease states. Performed at [facility], [address] [place], [place] [address]   APTT     Status: None   Collection Time: 04/02/19  5:43 PM  Result Value Ref Range   aPTT 27 24 - 36 seconds    Comment: Performed at [facility], [address] [place], [place] [address]  CBC     Status: Abnormal   Collection Time: 04/02/19  5:43 PM  Result Value Ref Range   WBC 9.2 4.0 - 10.5 K/uL   RBC 4.03 3.87 - 5.11 MIL/uL   Hemoglobin 11.8 (L) 12.0 - 15.0 g/dL  HCT 35.3 (L) 36.0 - 46.0 %   MCV 87.6 80.0 - 100.0 fL   MCH 29.3 26.0 - 34.0 pg   MCHC 33.4 30.0 - 36.0 g/dL   RDW 15.6 (H) 11.5 - 15.5 %   Platelets 254 150 - 400 K/uL   nRBC 0.0 0.0 - 0.2 %    Comment: Performed at [facility], [address] [place], [place] [address]  Differential     Status: Abnormal   Collection Time: 04/02/19  5:43 PM  Result Value Ref Range   Neutrophils Relative % 59 %   Neutro Abs 5.4 1.7 - 7.7 K/uL   Lymphocytes Relative 24 %   Lymphs Abs 2.2 0.7 - 4.0 K/uL   Monocytes Relative 11 %   Monocytes Absolute 1.0 0.1 - 1.0 K/uL   Eosinophils Relative 4 %   Eosinophils Absolute 0.4 0.0 - 0.5 K/uL   Basophils Relative 1 %   Basophils Absolute 0.1 0.0 - 0.1 K/uL   Immature Granulocytes 1 %   Abs Immature Granulocytes 0.09 (H) 0.00 - 0.07 K/uL    Comment: Performed at [facility], [address] [place], [place] [address]  Comprehensive metabolic panel     Status: Abnormal   Collection Time: 04/02/19  5:43 PM  Result Value Ref Range   Sodium 141 135 - 145 mmol/L   Potassium 4.2 3.5 - 5.1 mmol/L   Chloride 103 98 - 111 mmol/L   CO2 24 22 - 32 mmol/L   Glucose, Bld 109 (H) 70 - 99 mg/dL   BUN 45 (H) 8 - 23 mg/dL   Creatinine, Ser 1.35 (H) 0.44 - 1.00 mg/dL   Calcium 9.6 8.9 - 10.3 mg/dL   Total Protein 7.1 6.5 - 8.1 g/dL   Albumin 4.4 3.5 - 5.0 g/dL   AST 29 15 - 41 U/L   ALT 32 0 - 44 U/L   Alkaline Phosphatase 77 38 - 126 U/L   Total Bilirubin 0.6 0.3 - 1.2 mg/dL   GFR calc non Af Amer 40 (L) >60 mL/min   GFR calc Af Amer 46 (L) >60 mL/min   Anion gap 14 5 - 15    Comment: Performed at [facility], [address] [place], [place] [address]  Lipid panel     Status: Abnormal   Collection Time: 04/02/19  5:43 PM  Result Value Ref Range   Cholesterol 152 0 - 200 mg/dL   Triglycerides 201 (H) <150 mg/dL   HDL 32 (L) >40 mg/dL   Total CHOL/HDL Ratio 4.8 RATIO   VLDL 40 0 - 40 mg/dL   LDL Cholesterol 80 0 - 99 mg/dL    Comment:        Total Cholesterol/HDL:CHD Risk Coronary Heart Disease Risk Table                     Men   Women  1/2 Average Risk   3.4   3.3  Average Risk       5.0   4.4  2 X Average Risk   9.6   7.1  3 X Average Risk  23.4   11.0        Use the  calculated Patient Ratio above and the CHD Risk Table to determine the patient's CHD Risk.        ATP III CLASSIFICATION (LDL):  <100     mg/dL   Optimal  100-129  mg/dL   Near or  Above                    Optimal  130-159  mg/dL   Borderline  160-189  mg/dL   High  >190     mg/dL   Very High Performed at [facility], [address] [place], [place] [address]   Hemoglobin A1c     Status: Abnormal   Collection Time: 04/02/19  5:43 PM  Result Value Ref Range   Hgb A1c MFr Bld 7.2 (H) 4.8 - 5.6 %    Comment: (NOTE) Pre diabetes:          5.7%-6.4% Diabetes:              >6.4% Glycemic control for   <7.0% adults with diabetes    Mean Plasma Glucose 159.94 mg/dL    Comment: Performed at [facility] [address], [place], [place] [address]  SARS Coronavirus 2 (CEPHEID- Performed in [place] hospital lab), Hosp Order     Status: None   Collection Time: 04/02/19  8:35 PM   Specimen: Nasopharyngeal Swab  Result Value Ref Range   SARS Coronavirus 2 NEGATIVE NEGATIVE    Comment: (NOTE) If result is NEGATIVE SARS-CoV-2 target nucleic acids are NOT DETECTED. The SARS-CoV-2 RNA is generally detectable in upper and lower  respiratory specimens during the acute phase of infection. The lowest  concentration of SARS-CoV-2 viral copies this assay can detect is 250  copies / mL. A negative result does not preclude SARS-CoV-2 infection  and should not be used as the sole basis for treatment or other  patient management decisions.  A negative result may occur with  improper specimen collection / handling, submission of specimen other  than nasopharyngeal swab, presence of viral mutation(s) within the  areas targeted by this assay, and inadequate number of viral copies  (<250 copies / mL). A negative result must be combined with clinical  observations, patient history, and epidemiological information. If result is POSITIVE SARS-CoV-2 target nucleic acids are  DETECTED. The SARS-CoV-2 RNA is generally detectable in upper and lower  respiratory specimens dur ing the acute phase of infection.  Positive  results are indicative of active infection with SARS-CoV-2.  Clinical  correlation with patient history and other diagnostic information is  necessary to determine patient infection status.  Positive results do  not rule out bacterial infection or co-infection with other viruses. If result is PRESUMPTIVE POSTIVE SARS-CoV-2 nucleic acids MAY BE PRESENT.   A presumptive positive result was obtained on the submitted specimen  and confirmed on repeat testing.  While 2019 novel coronavirus  (SARS-CoV-2) nucleic acids may be present in the submitted sample  additional confirmatory testing may be necessary for epidemiological  and / or clinical management purposes  to differentiate between  SARS-CoV-2 and other Sarbecovirus currently known to infect humans.  If clinically indicated additional testing with an alternate test  methodology [phone]) is advised. The SARS-CoV-2 RNA is generally  detectable in upper and lower respiratory sp ecimens during the acute  phase of infection. The expected result is Negative. Fact Sheet for Patients:  StrictlyIdeas.no Fact Sheet for Healthcare Providers: BankingDealers.co.za This test is not yet approved or cleared by the [place] FDA and has been authorized for detection and/or diagnosis of SARS-CoV-2 by FDA under an Emergency Use Authorization (EUA).  This EUA will remain in effect (meaning this test can be used) for the duration of the COVID-19 declaration under Section 564(b)(1) of  the Act, 21 U.S.C. section 360bbb-3(b)(1), unless the authorization is terminated or revoked sooner. Performed at [facility], [address] [place], [place] [address]   MRSA PCR Screening     Status: None   Collection Time: 04/03/19  1:58 AM   Specimen: Nasal  Mucosa; Nasopharyngeal  Result Value Ref Range   MRSA by PCR NEGATIVE NEGATIVE    Comment:        The GeneXpert MRSA Assay (FDA approved for NASAL specimens only), is one component of a comprehensive MRSA colonization surveillance program. It is not intended to diagnose MRSA infection nor to guide or monitor treatment for MRSA infections. Performed at [facility], [address] [place],  [address]   Glucose, capillary     Status: Abnormal   Collection Time: 04/03/19  2:15 AM  Result Value Ref Range   Glucose-Capillary 164 (H) 70 - 99 mg/dL  HIV Antibody (routine testing w rflx)     Status: None   Collection Time: 04/03/19  4:07 AM  Result Value Ref Range   HIV Screen 4th Generation wRfx Non Reactive Non Reactive    Comment: (NOTE) Performed At: [facility] [place], [place] [id number] Rush Farmer MD QQ:[id number]   Glucose, capillary     Status: Abnormal   Collection Time: 04/03/19  7:55 AM  Result Value Ref Range   Glucose-Capillary 144 (H) 70 - 99 mg/dL  Glucose, capillary     Status: Abnormal   Collection Time: 04/03/19 10:46 AM  Result Value Ref Range   Glucose-Capillary 294 (H) 70 - 99 mg/dL  Glucose, capillary     Status: Abnormal   Collection Time: 04/03/19  3:18 PM  Result Value Ref Range   Glucose-Capillary 230 (H) 70 - 99 mg/dL  Glucose, capillary     Status: Abnormal   Collection Time: 04/03/19 10:58 PM  Result Value Ref Range   Glucose-Capillary 229 (H) 70 - 99 mg/dL  Glucose, capillary     Status: Abnormal   Collection Time: 04/04/19  7:48 AM  Result Value Ref Range   Glucose-Capillary 209 (H) 70 - 99 mg/dL    Current Facility-Administered Medications  Medication Dose Route Frequency Provider Last Rate Last Dose  . acetaminophen (TYLENOL) tablet 650 mg  650 mg Oral Q4H PRN Lang Snow, NP   650 mg at 04/04/19 0059   Or  . acetaminophen (TYLENOL) solution 650 mg  650 mg Per Tube Q4H PRN Lang Snow, NP       Or  . acetaminophen (TYLENOL) suppository 650 mg  650 mg Rectal Q4H PRN Lang Snow, NP      . Chlorhexidine Gluconate Cloth 2 % PADS 6 each  6 each Topical Daily Lang Snow, NP   6 each at 04/04/19 0130  . DULoxetine (CYMBALTA) DR capsule 30 mg  30 mg Oral Daily Sharma Covert, MD      . insulin aspart (novoLOG) injection 0-5 Units  0-5 Units Subcutaneous QHS Awilda Bill, NP   2 Units at 04/03/19 2309  . insulin aspart (novoLOG) injection 0-9 Units  0-9 Units Subcutaneous TID WC Awilda Bill, NP   3 Units at 04/04/19 0805  . levothyroxine (SYNTHROID) tablet 25 mcg  25 mcg Oral Once per day on Mon Tue Wed Thu Fri Sat Awilda Bill, NP   25 mcg at 04/04/19 7124   And  . [START ON 04/06/2019] levothyroxine (SYNTHROID) tablet 50 mcg  50  mcg Oral Once per day on Sun Blakeney, Dana G, NP      . montelukast (SINGULAIR) tablet 10 mg  10 mg Oral QHS Awilda Bill, NP   10 mg at 04/03/19 2310  . pantoprazole (PROTONIX) EC tablet 40 mg  40 mg Oral Daily Awilda Bill, NP   40 mg at 04/03/19 0932  . pregabalin (LYRICA) capsule 300 mg  300 mg Oral BID Awilda Bill, NP   300 mg at 04/03/19 2041  . rosuvastatin (CRESTOR) tablet 20 mg  20 mg Oral Daily Awilda Bill, NP   20 mg at 04/03/19 0932  . senna-docusate (Senokot-S) tablet 1 tablet  1 tablet Oral QHS PRN Lang Snow, NP      . sertraline (ZOLOFT) tablet 25 mg  25 mg Oral Daily Sharma Covert, MD      . sodium chloride flush (NS) 0.9 % injection 3 mL  3 mL Intravenous Once Nance Pear, MD      . traZODone (DESYREL) tablet 50 mg  50 mg Oral QHS PRN Lance Coon, MD   50 mg at 04/04/19 0320  . vitamin B-12 (CYANOCOBALAMIN) tablet 100 mcg  100 mcg Oral Daily Awilda Bill, NP   100 mcg at 04/03/19 5885  . vitamin C (ASCORBIC ACID) tablet 500 mg  500 mg Oral BID Awilda Bill, NP   500 mg at 04/03/19 2310    Musculoskeletal: Strength & Muscle Tone:  within normal limits Gait & Station: Lying in the bed Patient leans: N/A  Psychiatric Specialty Exam: Physical Exam  Nursing note and vitals reviewed. Constitutional: She is oriented to person, place, and time. She appears well-developed and well-nourished.  HENT:  Head: Normocephalic and atraumatic.  Neck: Normal range of motion.  Respiratory: Effort normal.  Neurological: She is alert and oriented to person, place, and time.  Psychiatric: Her speech is normal and behavior is normal. Judgment and thought content normal. Her affect is blunt. Cognition and memory are normal. She exhibits a depressed mood.    Review of Systems  Constitutional: Positive for malaise/fatigue.  Neurological: Positive for weakness.  Psychiatric/Behavioral: Positive for depression.  All other systems reviewed and are negative.   Blood pressure (!) 117/44, pulse 85, temperature 97.8 F (36.6 C), temperature source Oral, resp. rate 15, height 5\' 2"  (1.575 m), weight 114.3 kg, SpO2 94 %.Body mass index is 46.09 kg/m.  General Appearance: Disheveled  Eye Contact:  Good  Speech:  Normal Rate  Volume:  Normal  Mood:  Depressed, mild  Affect:  Congruent  Thought Process:  Coherent and Descriptions of Associations: Intact  Orientation:  Full (Time, Place, and Person)  Thought Content:  Logical  Suicidal Thoughts:  No  Homicidal Thoughts:  No  Memory:  Immediate;   Fair Recent;   Fair Remote;   Fair  Judgement:  Intact  Insight:  Fair  Psychomotor Activity:  Decreased  Concentration:  Concentration: Fair and Attention Span: Fair  Recall:  [employer] of Knowledge:  Good  Language:  Fair  Akathisia:  Negative  Handed:  Right  AIMS (if indicated):     Assets:  Desire for Improvement Resilience  ADL's:  Intact  Cognition:  WNL  Sleep:       Treatment Plan Summary: Daily contact with patient to assess and evaluate symptoms and progress in treatment, Medication management and Plan : Patient is seen  and examined.  Patient is a 68 year old female with the above-stated past  medical and psychiatric history in which psychiatric consultation was requested after symptoms suggestive of a cerebrovascular accident.  The work-up for that is been negative so far.  The patient admitted to a history of depression, but was unaware she was still taking psychiatric medications.  Currently she is on Cymbalta 60 mg p.o. daily.  The last primary care visit her endocrinology visit that is in the chart shows at least they felt as though she was still taking it at that time.  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  She admitted to having financial stressors currently.  I do not really think the Cymbalta is doing anything at this point.  She has been taking it for a while, and so what I recommend is reducing the dosage to 30 mg a day for 2 to 3 days, and then stopping it.  There is a possibility that some of her symptoms may have been from a discontinuation syndrome related to acute stoppage of the Cymbalta, but I am unclear on that at least at this point.  Either way I will reduce her dosage to 30 mg a day, and given her history Zoloft would most likely be the best choice for her.  We will start her on 25 mg today, and that will be titrated during the course the hospitalization.  We will reassess her in the morning, and if everything continues to go well we will consider increasing dosage to 50 mg p.o. daily.   Major Depressive Disorder, recurrent, mild: -Increased Zoloft from 25 mg to 50 mg -Decreased Cymbalta to 20 mg with plan to taper off in 2-3 days -Resources provided for therapy as requested  Disposition: Resources for therapy and psychiatry  Waylan Boga, NP 04/04/2019 9:08 AM

## 2019-04-07 ENCOUNTER — Other Ambulatory Visit: Payer: Self-pay | Admitting: *Deleted

## 2019-04-07 NOTE — Patient Outreach (Signed)
[place] [facility] [facility]) Care Management  04/07/2019  Summer [name] 09-12-1950 [id number]   EMMI Red - Telephone outreach  Patient is active with Summer Fountain RN ,Case Manager at  [facility] [place] office  Referral received : 04/07/19 Referral reason : EMMI red alert , Initial outreach call : 04/07/19  Insurance Plan; [place]  Patient 68 year old female admitted to [facility] on 7/22- 7/24, Left side weakness, s/p TPA, MRI negative for acute infarct.   PMHX; Includes but not limited to Diabetes, depression  GERD, Hypertension ,obesity,    EMMI red alert : Questions  Read Discharge paperwork : patient answers no Scheduled follow up : patient answers no   Subjective Initial outreach call to patient , successful explained reason for the call, reviewed her current engagement with Summer [name] , Care Manager at [facility] office.  Reviewed with patient this call related to her recent discharge from [facility] and automated follow up calls.  Patient discussed that she has received her discharge instructions, and scheduled follow up visit with Summer [name]. Reviewed recent discharge instructions new medications changes and follow up appointments.  Patient states that she is feeling much better, 100% better, states not sure what happened, didn't realize stress  could make her feel like that . She denies any weakness , dizziness. Reviewed stroke symptoms of sudden loss of balance, loss of vision in one or both eyes, arm or leg weakness, trouble speaking and headache patient denies any new concerns   Social Patient reports living at home she has support from her friend Summer [name]. Patient denies use of walker in home, uses a walker when out of the home and has a motorized scooter.  Discussed recent psychiatry referral while in patient, and recommendations for follow up patient states that she prefers to speak with a therapist.  She discussed resting better at night , she reviewed recent new medications of Zoloft ,  trazadone and change in dose of her cymbalta.  Patient discussed that she has not heard from [facility] for home health PT/OT  since discharged home; I placed a [facility], intake department states that they do not have patient name on the list. Placed call Summer [name] , representative in [place] area that referral had been communicated with by inpatient [facility] team, able to leave a  message for return call   Medications; Patient declines review of full medication list, states it is so many and she does have them all on the list.   Appointments  Patient has post hospital discharge visit with Summer [name] on 8/3, she reports having transportation to visit.    Plan Will alert Summer [name] of this outreach call on today for EMMI follow up. Will follow up on home health services ordered at discharge and communicate information with patient as received.  Will plan follow up call in the next 4 business day for  care coordination regarding home health services.   Summer Draft, RN, [place] Management Coordinator  [phone]- Mobile [phone]- Toll Free Main Office

## 2019-04-08 ENCOUNTER — Telehealth: Payer: Self-pay

## 2019-04-08 ENCOUNTER — Other Ambulatory Visit: Payer: Self-pay | Admitting: *Deleted

## 2019-04-08 NOTE — Telephone Encounter (Signed)
Transition Care Management Follow-up Telephone Call  Date of discharge and from where: 04/04/19 [facility]   How have you been since you were released from the hospital? Pt states she is doing better and glad she didn't   Any questions or concerns? No   Items Reviewed:  Did the pt receive and understand the discharge instructions provided? Yes   Medications obtained and verified? Yes   Any new allergies since your discharge? No   Dietary orders reviewed? Yes  Do you have support at home? Yes   Functional Questionnaire: (I = Independent and D = Dependent) ADLs: I  Bathing/Dressing- I  Meal Prep- I  Eating- I  Maintaining continence- I  Transferring/Ambulation- I  Managing Meds- I  Follow up appointments reviewed:   PCP Hospital f/u appt confirmed? Yes  Scheduled to see Dr. Ancil Boozer on 04/14/19 @ 1:00.  Are transportation arrangements needed? No   If their condition worsens, is the pt aware to call PCP or go to the Emergency Dept.? Yes  Was the patient provided with contact information for the PCP's office or ED? Yes  Was to pt encouraged to call back with questions or concerns? Yes

## 2019-04-08 NOTE — Patient Outreach (Signed)
[place] [facility]) Care Management  04/08/2019  Summer [name] June 18, 1951 [id number]   Telephone follow up call on EMMI red alert  Care Coordination     Patient 68 year old female admitted to [facility] on 7/22- 7/24, Left side weakness, s/p TPA, MRI negative for acute infarct.   PMHX; Includes but not limited to Diabetes, depression  GERD, Hypertension ,obesity,    Subjective Outreach [facility] to follow up on home health services, PT, OT representative states patient will have initial visit on 7/29.  Successful outreach call to patient , she reports receiving outreach call from [facility] home health with plan for initial visit on 7/29. Patient discussed that she is doing well on this morning , denies reviewed sign/symptoms of stroke.   Patient denies any new concerns at this time.   Plan Will send in basket message to Trish Fountain, RN Case Manager at PCP office to update on follow up EMMI read alert call.    Joylene Draft, RN, [place] Management Coordinator  [phone]- Mobile [phone]- Toll Free Main Office

## 2019-04-09 ENCOUNTER — Ambulatory Visit: Payer: Self-pay | Admitting: *Deleted

## 2019-04-10 ENCOUNTER — Other Ambulatory Visit: Payer: Self-pay | Admitting: *Deleted

## 2019-04-10 NOTE — Patient Outreach (Signed)
[place] [facility]) Care Management  04/10/2019  KOLBIE CLARKSTON February 12, 1951 [id number]   EMMI red alert call    Referral received : 7/30 Reason : EMMI red alert call    EMMI General discharge follow up coping.  Question : Sad, empty, hopeless , anxious : Patient answers yes    Subjective Outreach call to patient, explained purpose of the call , EMMI telephonic follow up call to answer to question related to coping .  Patient discussed not recalling getting a phone on yesterday, she denies feeling sad, hopeless anxious on today.  She discussed having physical therapy visit on yesterday, and states she didn't really need that now.  She is agreeable to being able to follow up with a mental health therapist; and thought that was what she was getting ,  explained that I will forward this information to Case Manager, Trish Fountain  at Dr. Ancil Boozer office for assistance with arranging .    Patient denies any new questions , concerns at this time, encouraged to notify care team, primary care office is new concerns , symptoms.  Plan  Will send message to Trish Fountain, Care management coordinator at [facility] office.  Patient will attend post discharge office visit on 8/3.  Joylene Draft, RN, [place] Management Coordinator  [phone]- Mobile [phone]- Toll Free Main Office

## 2019-04-14 ENCOUNTER — Encounter: Payer: Self-pay | Admitting: Family Medicine

## 2019-04-14 ENCOUNTER — Other Ambulatory Visit: Payer: Self-pay

## 2019-04-14 ENCOUNTER — Ambulatory Visit (INDEPENDENT_AMBULATORY_CARE_PROVIDER_SITE_OTHER): Payer: Medicare HMO | Admitting: Family Medicine

## 2019-04-14 VITALS — BP 140/58 | HR 102 | Temp 97.3°F | Resp 16 | Ht 62.0 in | Wt 254.8 lb

## 2019-04-14 DIAGNOSIS — Z09 Encounter for follow-up examination after completed treatment for conditions other than malignant neoplasm: Secondary | ICD-10-CM

## 2019-04-14 DIAGNOSIS — F99 Mental disorder, not otherwise specified: Secondary | ICD-10-CM

## 2019-04-14 DIAGNOSIS — F331 Major depressive disorder, recurrent, moderate: Secondary | ICD-10-CM

## 2019-04-14 DIAGNOSIS — F5105 Insomnia due to other mental disorder: Secondary | ICD-10-CM

## 2019-04-14 MED ORDER — SERTRALINE HCL 25 MG PO TABS: 25.0000 mg | ORAL_TABLET | Freq: Every day | ORAL | 1 refills | Status: DC

## 2019-04-14 MED ORDER — TRAZODONE HCL 100 MG PO TABS: 50.0000 mg | ORAL_TABLET | Freq: Every evening | ORAL | 1 refills | Status: DC | PRN

## 2019-04-14 NOTE — Progress Notes (Signed)
Name: Summer [name]   MRN: [id number]    DOB: 12-23-1950   Date:04/14/2019       Progress Note  Subjective  Chief Complaint  Chief Complaint  Patient presents with  . Hospitalization Follow-up    HPI  Hospital Discharge Follow up: she developed acute onset of of left side weakness, nausea, dysarthria on 04/02/2019 while getting ready to go out to eat. She checked bp at home and it was elevated 174/73, her boyfriend took her to [facility] and at the point she had left side weakness, the stroke protocol was activated, she was given TPA but CT and MRI were negative, also negative carotid doppler. She was seen by psychiatrist and duloxetine was decreased from 60 mg to 30 mg and zoloft added at 25 mg, she is feeling better since discharge, no neuro deficit , emotionally also feeling better. She has seen psychiatrist in the past and is willing to go back. A1C during her stay was down to 7.2%   Patient Active Problem List   Diagnosis Date Noted  . Major depressive disorder, recurrent episode, moderate ([place]) 04/04/2019  . Left arm weakness 04/03/2019  . (HFpEF) heart failure with preserved ejection fraction ([place]) 08/31/2017  . Moderate persistent asthma 08/31/2017  . Hyperlipidemia due to type 2 diabetes mellitus ([place]) 06/14/2017  . Essential hypertension 10/06/2016  . Morbid obesity with BMI of 40.0-44.9, adult ([place]) 10/06/2016  . Dyslipidemia associated with type 2 diabetes mellitus ([place]) 08/18/2016  . Charcot foot due to diabetes mellitus ([place]) 02/22/2016  . Hypertriglyceridemia 07/27/2015  . Acquired abduction deformity of foot 07/12/2015  . Osteoarthritis of subtalar joint 07/12/2015  . Poorly controlled type 2 diabetes mellitus with neuropathy ([place]) 07/12/2015  . Arthritis of foot, degenerative 07/12/2015  . Carpal tunnel syndrome 04/17/2015  . Chronic constipation 04/17/2015  . Insomnia, persistent 04/17/2015  . Chronic kidney disease (CKD), stage III (moderate) ([place]) 04/17/2015  . Decreased  exercise tolerance 04/17/2015  . Diabetes mellitus with polyneuropathy ([place]) 04/17/2015  . Gastro-esophageal reflux disease without esophagitis 04/17/2015  . Bursitis, trochanteric 04/17/2015  . Cephalalgia 04/17/2015  . Benign hypertension 04/17/2015  . Adult hypothyroidism 04/17/2015  . Hearing loss 04/17/2015  . Chronic recurrent major depressive disorder ([place]) 04/17/2015  . Neurogenic claudication 04/17/2015  . Obesity (BMI 35.0-39.9 without comorbidity) 04/17/2015  . Hypo-ovarianism 04/17/2015  . Perennial allergic rhinitis with seasonal variation 04/17/2015  . Acne erythematosa 04/17/2015  . Dyskinesia, tardive 04/17/2015  . Memory loss 04/17/2015  . Impingement syndrome of shoulder 04/17/2015  . Dermatitis, stasis 04/17/2015  . Obstructive sleep apnea 05/14/2014  . Dyspnea 05/06/2014  . Hyperlipidemia 02/06/2012  . LBP (low back pain) 09/16/2008    Past Surgical History:  Procedure Laterality Date  . ANKLE SURGERY Left approx Jan 2018  . CATARACT EXTRACTION  01/2011   right  . eye lid surgery  2013   bilateral  . FOOT SURGERY    . NECK SURGERY    . TUBAL LIGATION    . VAGINAL HYSTERECTOMY  1989    Family History  Problem Relation Age of Onset  . Heart attack Mother   . Aneurysm Mother   . Heart attack Maternal Grandfather   . Diabetes Maternal Grandfather     Social History   Socioeconomic History  . Marital status: Significant Other    Spouse name: Not on file  . Number of children: 1  . Years of education: Not on file  . Highest education level: High school graduate  Occupational  History  . Occupation: retired  Scientific laboratory technician  . Financial resource strain: Not hard at all  . Food insecurity    Worry: Never true    Inability: Never true  . Transportation needs    Medical: No    Non-medical: No  Tobacco Use  . Smoking status: Never Smoker  . Smokeless tobacco: Never Used  Substance and Sexual Activity  . Alcohol use: No    Alcohol/week: 0.0  standard drinks    Frequency: Never  . Drug use: No  . Sexual activity: Yes    Partners: Male  Lifestyle  . Physical activity    Days per week: 0 days    Minutes per session: 0 min  . Stress: Rather much  Relationships  . Social connections    Talks on phone: More than three times a week    Gets together: Once a week    Attends religious service: Never    Active member of club or organization: No    Attends meetings of clubs or organizations: Never    Relationship status: Living with partner  . Intimate partner violence    Fear of current or ex partner: No    Emotionally abused: No    Physically abused: No    Forced sexual activity: No  Other Topics Concern  . Not on file  Social History Narrative   Son lives in [place] (retired from [employer]) and her granddaughter (89yr old) [place].     Current Outpatient Medications:  .  ACCU-CHEK SOFTCLIX LANCETS lancets, , Disp: , Rfl:  .  acetaminophen (TYLENOL 8 HOUR) 650 MG CR tablet, Take 1 tablet (650 mg total) by mouth every 8 (eight) hours as needed for pain., Disp: 90 tablet, Rfl: 2 .  albuterol (PROVENTIL HFA) 108 (90 Base) MCG/ACT inhaler, Inhale 2 puffs into the lungs 2 (two) times daily as needed for shortness of breath. , Disp: , Rfl:  .  Alcohol Swabs (B-D SINGLE USE SWABS REGULAR) PADS, , Disp: , Rfl:  .  amLODipine (NORVASC) 2.5 MG tablet, Take 2.5 mg by mouth daily. , Disp: , Rfl:  .  ASPIRIN LOW DOSE 81 MG EC tablet, TAKE 1 TABLET (81 MG TOTAL) BY MOUTH DAILY., Disp: 30 tablet, Rfl: 0 .  Blood Glucose Monitoring Suppl (ACCU-CHEK AVIVA PLUS) w/Device KIT, , Disp: , Rfl:  .  Cyanocobalamin (VITAMIN B12 PO), Take 1 tablet by mouth daily. , Disp: , Rfl:  .  DULoxetine (CYMBALTA) 30 MG capsule, Take 1 capsule (30 mg total) by mouth daily., Disp: 30 capsule, Rfl: 3 .  furosemide (LASIX) 20 MG tablet, Take 1 tablet (20 mg total) by mouth daily., Disp: 90 tablet, Rfl: 0 .  Icosapent Ethyl (VASCEPA) 1 g  CAPS, Take 2 capsules (2 g total) by mouth 2 (two) times daily., Disp: 360 capsule, Rfl: 1 .  insulin NPH-regular Human (70-30) 100 UNIT/ML injection, Inject 100 Units into the skin 2 (two) times daily with a meal., Disp: , Rfl:  .  irbesartan-hydrochlorothiazide (AVALIDE) 300-12.5 MG tablet, Take 1 tablet by mouth daily., Disp: 90 tablet, Rfl: 1 .  levothyroxine (SYNTHROID) 25 MCG tablet, One daily and two on sundays (Patient taking differently: Take 25-50 mcg by mouth daily before breakfast. One tablet daily and two tablets on Sundays), Disp: 100 tablet, Rfl: 0 .  metFORMIN (GLUCOPHAGE-XR) 500 MG 24 hr tablet, Take 500 mg by mouth. 2 tablets daily (total of 1000 mg daily), Disp: , Rfl:  .  metoprolol tartrate (LOPRESSOR) 25 MG tablet, Take 1 tablet (25 mg total) by mouth 2 (two) times daily., Disp: 180 tablet, Rfl: 1 .  Misc Natural Products (OSTEO BI-FLEX ADV JOINT SHIELD PO), Take by mouth 2 (two) times a week., Disp: , Rfl:  .  montelukast (SINGULAIR) 10 MG tablet, Take 1 tablet (10 mg total) by mouth at bedtime., Disp: 90 tablet, Rfl: 0 .  Omega-3 Fatty Acids (FISH OIL) 1000 MG CAPS, Take 1,000 mg by mouth daily. , Disp: , Rfl:  .  omeprazole (PRILOSEC) 40 MG capsule, Take 1 capsule (40 mg total) by mouth daily., Disp: 90 capsule, Rfl: 1 .  Potassium 99 MG TABS, Take by mouth., Disp: , Rfl:  .  pregabalin (LYRICA) 300 MG capsule, Take 300 mg by mouth 2 (two) times a day., Disp: , Rfl:  .  Probiotic Product (PROBIOTIC-10 PO), Take by mouth., Disp: , Rfl:  .  RELION INSULIN SYRINGE 1ML/31G 31G X 5/16" 1 ML MISC, , Disp: , Rfl:  .  rosuvastatin (CRESTOR) 20 MG tablet, Take 1 tablet (20 mg total) by mouth daily., Disp: 90 tablet, Rfl: 1 .  sertraline (ZOLOFT) 25 MG tablet, Take 1 tablet (25 mg total) by mouth daily., Disp: 30 tablet, Rfl: 0 .  traZODone (DESYREL) 50 MG tablet, Take 1 tablet (50 mg total) by mouth at bedtime as needed for sleep., Disp: 30 tablet, Rfl: 0 .  vitamin C (ASCORBIC ACID)  500 MG tablet, Take 500 mg by mouth 2 (two) times daily. , Disp: , Rfl:  .  VITAMIN D, CHOLECALCIFEROL, PO, Take 1,000 Units by mouth daily. , Disp: , Rfl:   Allergies  Allergen Reactions  . Codeine Other (See Comments)    "TRIPPED OUT"  DIDN'T LIKE THE [place]  . Atorvastatin     muscle pain  . Hydrocodone     itching  . Latex Rash  . Zolpidem Other (See Comments)    Sleep walk    I personally reviewed active problem list, medication list, allergies, family history, social history with the patient/caregiver today.   ROS  Constitutional: Negative for fever or weight change.  Respiratory: Negative for cough and shortness of breath.   Cardiovascular: Negative for chest pain or palpitations.  Gastrointestinal: Negative for abdominal pain, no bowel changes.  Musculoskeletal: Negative for gait problem or joint swelling.  Skin: Negative for rash.  Neurological: Negative for dizziness or headache.  No other specific complaints in a complete review of systems (except as listed in HPI above).  Objective  Vitals:   04/14/19 1253 04/14/19 1257  BP: (!) 152/58 (!) 140/58  Pulse: (!) 102   Resp: 16   Temp: (!) 97.3 F (36.3 C)   TempSrc: Temporal   SpO2: 94%   Weight: 254 lb 12.8 oz (115.6 kg)   Height: '5\' 2"'$  (1.575 m)     Body mass index is 46.6 kg/m.  Physical Exam  Constitutional: Patient appears well-developed and well-nourished. Obese No distress.  HEENT: head atraumatic, normocephalic, pupils equal and reactive to light,neck supple, Cardiovascular: Normal rate, regular rhythm and normal heart sounds.  No murmur heard. No BLE edema. Pulmonary/Chest: Effort normal and breath sounds normal. No respiratory distress. Abdominal: Soft.  There is no tenderness. Muscular Skeletal: using a walker Skin: picks on her skin, small ulceration on chin  Psychiatric: Patient has a normal mood and affect. behavior is normal. Judgment and thought content normal.  Recent Results  (from the past 2160 hour(s))  Glucose,  capillary     Status: Abnormal   Collection Time: 04/02/19  5:28 PM  Result Value Ref Range   Glucose-Capillary 112 (H) 70 - 99 mg/dL   Comment 1 Notify RN    Comment 2 Document in Chart   Protime-INR     Status: None   Collection Time: 04/02/19  5:43 PM  Result Value Ref Range   Prothrombin Time 13.0 11.4 - 15.2 seconds   INR 1.0 0.8 - 1.2    Comment: (NOTE) INR goal varies based on device and disease states. Performed at [facility], [address] [place], [place] [address]   APTT     Status: None   Collection Time: 04/02/19  5:43 PM  Result Value Ref Range   aPTT 27 24 - 36 seconds    Comment: Performed at [facility], [address] [place], [place] [address]  CBC     Status: Abnormal   Collection Time: 04/02/19  5:43 PM  Result Value Ref Range   WBC 9.2 4.0 - 10.5 K/uL   RBC 4.03 3.87 - 5.11 MIL/uL   Hemoglobin 11.8 (L) 12.0 - 15.0 g/dL   HCT 35.3 (L) 36.0 - 46.0 %   MCV 87.6 80.0 - 100.0 fL   MCH 29.3 26.0 - 34.0 pg   MCHC 33.4 30.0 - 36.0 g/dL   RDW 15.6 (H) 11.5 - 15.5 %   Platelets 254 150 - 400 K/uL   nRBC 0.0 0.0 - 0.2 %    Comment: Performed at [facility], [address] [place], [place] [address]  Differential     Status: Abnormal   Collection Time: 04/02/19  5:43 PM  Result Value Ref Range   Neutrophils Relative % 59 %   Neutro Abs 5.4 1.7 - 7.7 K/uL   Lymphocytes Relative 24 %   Lymphs Abs 2.2 0.7 - 4.0 K/uL   Monocytes Relative 11 %   Monocytes Absolute 1.0 0.1 - 1.0 K/uL   Eosinophils Relative 4 %   Eosinophils Absolute 0.4 0.0 - 0.5 K/uL   Basophils Relative 1 %   Basophils Absolute 0.1 0.0 - 0.1 K/uL   Immature Granulocytes 1 %   Abs Immature Granulocytes 0.09 (H) 0.00 - 0.07 K/uL    Comment: Performed at [facility], [address] [place], [place] [address]  Comprehensive metabolic panel     Status: Abnormal   Collection Time: 04/02/19  5:43 PM  Result  Value Ref Range   Sodium 141 135 - 145 mmol/L   Potassium 4.2 3.5 - 5.1 mmol/L   Chloride 103 98 - 111 mmol/L   CO2 24 22 - 32 mmol/L   Glucose, Bld 109 (H) 70 - 99 mg/dL   BUN 45 (H) 8 - 23 mg/dL   Creatinine, Ser 1.35 (H) 0.44 - 1.00 mg/dL   Calcium 9.6 8.9 - 10.3 mg/dL   Total Protein 7.1 6.5 - 8.1 g/dL   Albumin 4.4 3.5 - 5.0 g/dL   AST 29 15 - 41 U/L   ALT 32 0 - 44 U/L   Alkaline Phosphatase 77 38 - 126 U/L   Total Bilirubin 0.6 0.3 - 1.2 mg/dL   GFR calc non Af Amer 40 (L) >60 mL/min   GFR calc Af Amer 46 (L) >60 mL/min   Anion gap 14 5 - 15    Comment: Performed at [facility], [address] [place], [place] [address]  Lipid panel     Status:  Abnormal   Collection Time: 04/02/19  5:43 PM  Result Value Ref Range   Cholesterol 152 0 - 200 mg/dL   Triglycerides 201 (H) <150 mg/dL   HDL 32 (L) >40 mg/dL   Total CHOL/HDL Ratio 4.8 RATIO   VLDL 40 0 - 40 mg/dL   LDL Cholesterol 80 0 - 99 mg/dL    Comment:        Total Cholesterol/HDL:CHD Risk Coronary Heart Disease Risk Table                     Men   Women  1/2 Average Risk   3.4   3.3  Average Risk       5.0   4.4  2 X Average Risk   9.6   7.1  3 X Average Risk  23.4   11.0        Use the calculated Patient Ratio above and the CHD Risk Table to determine the patient's CHD Risk.        ATP III CLASSIFICATION (LDL):  <100     mg/dL   Optimal  100-129  mg/dL   Near or Above                    Optimal  130-159  mg/dL   Borderline  160-189  mg/dL   High  >190     mg/dL   Very High Performed at [facility], [address] [place], [place] [address]   Hemoglobin A1c     Status: Abnormal   Collection Time: 04/02/19  5:43 PM  Result Value Ref Range   Hgb A1c MFr Bld 7.2 (H) 4.8 - 5.6 %    Comment: (NOTE) Pre diabetes:          5.7%-6.4% Diabetes:              >6.4% Glycemic control for   <7.0% adults with diabetes    Mean Plasma Glucose 159.94 mg/dL    Comment: Performed at [facility] [address] [place], [place] [address]  Novel Coronavirus,NAA,(SEND-OUT TO REF LAB - TAT 24-48 hrs); Hosp Order     Status: None   Collection Time: 04/02/19  6:20 PM   Specimen: Nasopharyngeal Swab; Respiratory  Result Value Ref Range   SARS-CoV-2, NAA NOT DETECTED NOT DETECTED    Comment: (NOTE) This test was developed and its performance characteristics determined by [employer]. This test has not been FDA cleared or approved. This test has been authorized by FDA under an Emergency Use Authorization (EUA). This test is only authorized for the duration of time the declaration that circumstances exist justifying the authorization of the emergency use of in vitro diagnostic tests for detection of SARS-CoV-2 virus and/or diagnosis of COVID-19 infection under section 564(b)(1) of the Act, 21 U.S.C. 683MHD-6(Q)(2), unless the authorization is terminated or revoked sooner. When diagnostic testing is negative, the possibility of a false negative result should be considered in the context of a patient's recent exposures and the presence of clinical signs and symptoms consistent with COVID-19. An individual without symptoms of COVID-19 and who is not shedding SARS-CoV-2 virus would expect to have a negative (not detected) result in this assay. Performed  At: [facility] [address] [place], [place] [id number] Rush Farmer MD HE:[id number]    Coronavirus Source NASOPHARYNGEAL     Comment: Performed at [facility], [address] [place], [place] [address]  SARS Coronavirus 2 (  CEPHEID- Performed in [place] hospital lab), Hosp Order     Status: None   Collection Time: 04/02/19  8:35 PM   Specimen: Nasopharyngeal Swab  Result Value Ref Range   SARS Coronavirus 2 NEGATIVE NEGATIVE    Comment: (NOTE) If result is NEGATIVE SARS-CoV-2 target nucleic acids are NOT DETECTED. The SARS-CoV-2 RNA is generally detectable in upper and lower   respiratory specimens during the acute phase of infection. The lowest  concentration of SARS-CoV-2 viral copies this assay can detect is 250  copies / mL. A negative result does not preclude SARS-CoV-2 infection  and should not be used as the sole basis for treatment or other  patient management decisions.  A negative result may occur with  improper specimen collection / handling, submission of specimen other  than nasopharyngeal swab, presence of viral mutation(s) within the  areas targeted by this assay, and inadequate number of viral copies  (<250 copies / mL). A negative result must be combined with clinical  observations, patient history, and epidemiological information. If result is POSITIVE SARS-CoV-2 target nucleic acids are DETECTED. The SARS-CoV-2 RNA is generally detectable in upper and lower  respiratory specimens dur ing the acute phase of infection.  Positive  results are indicative of active infection with SARS-CoV-2.  Clinical  correlation with patient history and other diagnostic information is  necessary to determine patient infection status.  Positive results do  not rule out bacterial infection or co-infection with other viruses. If result is PRESUMPTIVE POSTIVE SARS-CoV-2 nucleic acids MAY BE PRESENT.   A presumptive positive result was obtained on the submitted specimen  and confirmed on repeat testing.  While 2019 novel coronavirus  (SARS-CoV-2) nucleic acids may be present in the submitted sample  additional confirmatory testing may be necessary for epidemiological  and / or clinical management purposes  to differentiate between  SARS-CoV-2 and other Sarbecovirus currently known to infect humans.  If clinically indicated additional testing with an alternate test  methodology [phone]) is advised. The SARS-CoV-2 RNA is generally  detectable in upper and lower respiratory sp ecimens during the acute  phase of infection. The expected result is Negative. Fact  Sheet for Patients:  StrictlyIdeas.no Fact Sheet for Healthcare Providers: BankingDealers.co.za This test is not yet approved or cleared by the [place] FDA and has been authorized for detection and/or diagnosis of SARS-CoV-2 by FDA under an Emergency Use Authorization (EUA).  This EUA will remain in effect (meaning this test can be used) for the duration of the COVID-19 declaration under Section 564(b)(1) of the Act, 21 U.S.C. section 360bbb-3(b)(1), unless the authorization is terminated or revoked sooner. Performed at [facility], [address] [place], [place] [address]   MRSA PCR Screening     Status: None   Collection Time: 04/03/19  1:58 AM   Specimen: Nasal Mucosa; Nasopharyngeal  Result Value Ref Range   MRSA by PCR NEGATIVE NEGATIVE    Comment:        The GeneXpert MRSA Assay (FDA approved for NASAL specimens only), is one component of a comprehensive MRSA colonization surveillance program. It is not intended to diagnose MRSA infection nor to guide or monitor treatment for MRSA infections. Performed at [facility], [address] [place], [place] [address]   Glucose, capillary     Status: Abnormal   Collection Time: 04/03/19  2:15 AM  Result Value Ref Range   Glucose-Capillary 164 (H) 70 - 99 mg/dL  HIV Antibody (routine testing w rflx)  Status: None   Collection Time: 04/03/19  4:07 AM  Result Value Ref Range   HIV Screen 4th Generation wRfx Non Reactive Non Reactive    Comment: (NOTE) Performed At: [facility] [place], [place] [id number] Rush Farmer MD PY:[id number]   Glucose, capillary     Status: Abnormal   Collection Time: 04/03/19  7:55 AM  Result Value Ref Range   Glucose-Capillary 144 (H) 70 - 99 mg/dL  ECHOCARDIOGRAM COMPLETE     Status: None   Collection Time: 04/03/19  9:13 AM  Result Value Ref Range   Weight 4,031.77 oz   Height 62 in   BP  131/51 mmHg  Glucose, capillary     Status: Abnormal   Collection Time: 04/03/19 10:46 AM  Result Value Ref Range   Glucose-Capillary 294 (H) 70 - 99 mg/dL  Glucose, capillary     Status: Abnormal   Collection Time: 04/03/19  3:18 PM  Result Value Ref Range   Glucose-Capillary 230 (H) 70 - 99 mg/dL  Glucose, capillary     Status: Abnormal   Collection Time: 04/03/19 10:58 PM  Result Value Ref Range   Glucose-Capillary 229 (H) 70 - 99 mg/dL  Glucose, capillary     Status: Abnormal   Collection Time: 04/04/19  7:48 AM  Result Value Ref Range   Glucose-Capillary 209 (H) 70 - 99 mg/dL  Glucose, capillary     Status: Abnormal   Collection Time: 04/04/19 11:46 AM  Result Value Ref Range   Glucose-Capillary 354 (H) 70 - 99 mg/dL     PHQ2/9: Depression screen [facility] 2/9 04/14/2019 12/16/2018 12/02/2018 10/16/2018 09/18/2018  Decreased Interest '3 2 1 3 3  '$ Down, Depressed, Hopeless '2 2 3 3 2  '$ PHQ - 2 Score '5 4 4 6 5  '$ Altered sleeping '3 3 3 3 '$ -  Tired, decreased energy '3 2 3 3 '$ -  Change in appetite '3 3 3 3 '$ -  Feeling bad or failure about yourself  '2 2 2 2 '$ -  Trouble concentrating '2 2 3 3 '$ -  Moving slowly or fidgety/restless 2 0 0 2 -  Suicidal thoughts '2 1 3 3 '$ -  PHQ-9 Score '22 17 21 25 '$ -  Difficult doing work/chores Very difficult Very difficult Very difficult Very difficult -  Some recent data might be hidden    phq 9 is positive   Fall Risk: Fall Risk  04/14/2019 03/20/2019 02/20/2019 01/22/2019 12/25/2018  Falls in the past year? 0 0 0 0 0  Comment - - - - -  Number falls in past yr: 0 - - - -  Injury with Fall? 0 - - - -  Risk Factor Category  - - - - -  Comment - - - - -  Risk for fall due to : - - - - -  Risk for fall due to: Comment - - - - -  Follow up - - - - -    Functional Status Survey: Is the patient deaf or have difficulty hearing?: No Does the patient have difficulty seeing, even when wearing glasses/contacts?: No Does the patient have difficulty concentrating,  remembering, or making decisions?: No Does the patient have difficulty walking or climbing stairs?: Yes Does the patient have difficulty dressing or bathing?: No Does the patient have difficulty doing errands alone such as visiting a doctor's office or shopping?: No    Assessment & Plan   1. Hospital discharge follow-up  - Ambulatory referral to Psychiatry  2. MDD (major depressive disorder), recurrent episode, moderate ([place])  - Ambulatory referral to Psychiatry - sertraline (ZOLOFT) 25 MG tablet; Take 1 tablet (25 mg total) by mouth daily.  Dispense: 30 tablet; Refill: 1  3. Insomnia due to other mental disorder  - traZODone (DESYREL) 100 MG tablet; Take 0.5 tablets (50 mg total) by mouth at bedtime as needed for sleep.  Dispense: 30 tablet; Refill: 1

## 2019-04-16 ENCOUNTER — Ambulatory Visit: Payer: Medicare HMO

## 2019-04-16 ENCOUNTER — Other Ambulatory Visit: Payer: Self-pay

## 2019-04-16 DIAGNOSIS — F331 Major depressive disorder, recurrent, moderate: Secondary | ICD-10-CM

## 2019-04-16 DIAGNOSIS — Z09 Encounter for follow-up examination after completed treatment for conditions other than malignant neoplasm: Secondary | ICD-10-CM

## 2019-04-16 NOTE — Chronic Care Management (AMB) (Signed)
Chronic Care Management   Follow Up Note   04/16/2019 Name: Summer [name] MRN: [id number] DOB: [birth year]  Referred by: Steele Sizer, MD Reason for referral : Chronic Care Management (follow up hospitalization)   Subjective: "I went to the hospital thinking I was having a stroke but the psychiatrist said it was symptoms of severe stress"   Objective:  Lab Results  Component Value Date   HGBA1C 7.2 (H) 04/02/2019   HGBA1C 8.1 (A) 08/27/2018   HGBA1C 8.1 08/27/2018   HGBA1C 8.1 (A) 08/27/2018   HGBA1C 8.1 (A) 08/27/2018   Lab Results  Component Value Date   MICROALBUR 0.6 01/28/2018   LDLCALC 80 04/02/2019   CREATININE 1.35 (H) 04/02/2019   BP Readings from Last 3 Encounters:  04/14/19 (!) 140/58  04/04/19 (!) 117/44  12/16/18 135/80   Lab Results  Component Value Date   CHOL 152 04/02/2019   HDL 32 (L) 04/02/2019   LDLCALC 80 04/02/2019   TRIG 201 (H) 04/02/2019   CHOLHDL 4.8 04/02/2019    Assessment: Summer [name] is a 68 year old female patient of Summer [name] who is well know to this RN CM. She was previously referred to [facility] Team last year for DM management. She is managing her chronic health conditions better and in the process of being worked up for bariatric surgery. She recently was hospitalized for stroke like symptoms and given TPA however all test for stroke were negative therefore patient was referred to psychiatry for mental health assessment. Patient states she is under an insurmountable amount of stress.  Review of patient status, including review of consultants reports, relevant laboratory and other test results, and collaboration with appropriate care team members and the patient's provider was performed as part of comprehensive patient evaluation and provision of chronic care management services.    Goals Addressed            This Visit's Progress   . My financial stress is affecting my mental health (pt-stated)       Summer [name] is living with her  boyfriend of 20+ years. She previously owned a business but sold for 10,000. She has a lot of debt from medical bills and has spent all but 4000.00 of the 10,000 profit. She currently brings in 1200.00/month social security. She has a previously owned home that she is renting. Tenant signed a lease for 475.00 however this does not cover entire mortgage or monthly insurance premium. She is unable to increase rent because of lease. She pays over 150.00/month in medication costs. She would like to file for bankruptcy because of her debt but cannot afford an attorney. This financial stress is causing her to loose sleep. She states her newly prescribed medications for sleep and depression are helping a lot and she feels much better than she did previously. She is completing the requirement for bariatric surgery and hopes to have surgery scheduled for September. She hopes to return to some form of employment after her recovery.  Current Barriers:  Marland Kitchen Knowledge Deficits related to available resources for financial counseling  . Film/video editor.   Nurse Case Manager Clinical Goal(s):  Marland Kitchen Over the next 14 days, patient will report utilization of provided resources to explore options for financial counseling . Over the next 14 days, patient will report taking all medications including antidepressant as prescribed . Over the next 14 days, patient will report receiving appointment with mental health provider  Interventions:  . Colloboration with [facility] SW to  appropriate financial counseling resources . Provided patient with contact information for [facility] of the [employer] [phone] . Assessed for current symptoms of anxiety and depression and medication compliance . Provided emotional support and reassurance . Discussed recent hospitalization and PCP follow up visit including referral to psychiatrist  Patient Self Care Activities:  . Self administers medications as  prescribed . Attends all scheduled provider appointments . Calls pharmacy for medication refills . Attends church or other social activities . Performs ADL's independently . Performs IADL's independently . Calls provider office for new concerns or questions  Initial goal documentation         Telephone follow up appointment with care management team member scheduled for: 2 weeks    Summer [name] E. Rollene Rotunda, RN, BSN Nurse Care Coordinator [facility] Care Management  [phone]

## 2019-04-16 NOTE — Patient Instructions (Signed)
Thank you allowing the Chronic Care Management Team to be a part of your care! It was a pleasure speaking with you today!  1. Continue to take your medications as prescribed. I am so glad you are feeling better physically and mentally. 2. If you have not heard from a psychiatry referral in two weeks let me know. 3. Please call [facility] to see if they can assist your with financial counseling 4. Continue to be as active as you can and keep all your medical appointments.  CCM (Chronic Care Management) Team   Trish Fountain RN, BSN Nurse Care Coordinator  [phone]  Ruben Reason PharmD  Clinical Pharmacist  [phone]   Elliot Gurney, LCSW Clinical Social Worker [phone]  Goals Addressed            This Visit's Progress   . My financial stress is affecting my mental health (pt-stated)       Current Barriers:  Marland Kitchen Knowledge Deficits related to available resources for financial counseling  . Film/video editor.   Nurse Case Manager Clinical Goal(s):  Marland Kitchen Over the next 14 days, patient will report utilization of provided resources to explore options for financial counseling . Over the next 14 days, patient will report taking all medications including antidepressant as prescribed . Over the next 14 days, patient will report receiving appointment with mental health provider  Interventions:  . Colloboration with [facility] SW to appropriate financial counseling resources . Provided patient with contact information for [facility] of the [employer] [phone] . Assessed for current symptoms of anxiety and depression and medication compliance . Provided emotional support and reassurance . Discussed recent hospitalization and PCP follow up visit including referral to psychiatrist  Patient Self Care Activities:  . Self administers medications as prescribed . Attends all scheduled provider appointments . Calls pharmacy for  medication refills . Attends church or other social activities . Performs ADL's independently . Performs IADL's independently . Calls provider office for new concerns or questions  Initial goal documentation        The patient verbalized understanding of instructions provided today and declined a print copy of patient instruction materials.   Telephone follow up appointment with care management team member scheduled for:  2 weeks SYMPTOMS OF A STROKE   You have any symptoms of stroke. "BE FAST" is an easy way to remember the main warning signs: ? B - Balance. Signs are dizziness, sudden trouble walking, or loss of balance. ? E - Eyes. Signs are trouble seeing or a sudden change in how you see. ? F - Face. Signs are sudden weakness or loss of feeling of the face, or the face or eyelid drooping on one side. ? A - Arms. Signs are weakness or loss of feeling in an arm. This happens suddenly and usually on one side of the body. ? S - Speech. Signs are sudden trouble speaking, slurred speech, or trouble understanding what people say. ? T - Time. Time to call emergency services. Write down what time symptoms started.  You have other signs of stroke, such as: ? A sudden, very bad headache with no known cause. ? Feeling sick to your stomach (nausea). ? Throwing up (vomiting). ? Jerky movements you cannot control (seizure).  SYMPTOMS OF A HEART ATTACK  What are the signs or symptoms? Symptoms of this condition include:  Chest pain. It may feel like: ? Crushing or squeezing. ? Tightness, pressure, fullness, or heaviness.  Pain  in the arm, neck, jaw, back, or upper body.  Shortness of breath.  Heartburn.  Indigestion.  Nausea.  Cold sweats.  Feeling tired.  Sudden lightheadedness.

## 2019-04-22 ENCOUNTER — Ambulatory Visit: Payer: Self-pay | Admitting: Pharmacist

## 2019-04-22 DIAGNOSIS — F331 Major depressive disorder, recurrent, moderate: Secondary | ICD-10-CM

## 2019-04-22 DIAGNOSIS — F99 Mental disorder, not otherwise specified: Secondary | ICD-10-CM

## 2019-04-22 DIAGNOSIS — F5105 Insomnia due to other mental disorder: Secondary | ICD-10-CM

## 2019-04-22 NOTE — Chronic Care Management (AMB) (Signed)
  Chronic Care Management   Note  04/22/2019 Name: Summer [name] MRN: [id number] DOB: Aug 10, 1951  Received internal communication from [facility] regarding patient Summer [name]. Summer [name] is a patient of Dr. Steele Sizer and has been engaged with CCM program since fall of 2019. Successful telephone outreach this morning, HIPAA identifiers verified.  Outreach today to ensure that Summer [name] had the resources and financial stability she needed to afford medications. Summer [name] thanks pharmacist for the call and states that all medications are affordable at this time.   Follow up plan: The patient has been provided with contact information for the care management team and has been advised to call with any health related questions or concerns.   Ruben Reason, PharmD Clinical Pharmacist [facility] [phone]

## 2019-04-23 DIAGNOSIS — Z794 Long term (current) use of insulin: Secondary | ICD-10-CM | POA: Diagnosis not present

## 2019-04-23 DIAGNOSIS — E1142 Type 2 diabetes mellitus with diabetic polyneuropathy: Secondary | ICD-10-CM | POA: Diagnosis not present

## 2019-04-23 DIAGNOSIS — E785 Hyperlipidemia, unspecified: Secondary | ICD-10-CM | POA: Diagnosis not present

## 2019-04-23 DIAGNOSIS — E1169 Type 2 diabetes mellitus with other specified complication: Secondary | ICD-10-CM | POA: Diagnosis not present

## 2019-04-23 DIAGNOSIS — I10 Essential (primary) hypertension: Secondary | ICD-10-CM | POA: Diagnosis not present

## 2019-04-23 DIAGNOSIS — E1159 Type 2 diabetes mellitus with other circulatory complications: Secondary | ICD-10-CM | POA: Diagnosis not present

## 2019-04-28 ENCOUNTER — Ambulatory Visit (INDEPENDENT_AMBULATORY_CARE_PROVIDER_SITE_OTHER): Payer: Medicare HMO | Admitting: Psychology

## 2019-04-28 DIAGNOSIS — F509 Eating disorder, unspecified: Secondary | ICD-10-CM | POA: Diagnosis not present

## 2019-04-30 ENCOUNTER — Ambulatory Visit: Payer: Self-pay

## 2019-04-30 DIAGNOSIS — Z6841 Body Mass Index (BMI) 40.0 and over, adult: Secondary | ICD-10-CM

## 2019-04-30 DIAGNOSIS — F331 Major depressive disorder, recurrent, moderate: Secondary | ICD-10-CM

## 2019-04-30 NOTE — Patient Instructions (Addendum)
Thank you allowing the Chronic Care Management Team to be a part of your care! It was a pleasure speaking with you today!  1. Please use the numbers given for [facility] and [facility] to see if you can be established as a patient there. 2. Call [facility] for a list of in-network mental health provider and call for appointment if you are not successful with [place] or [place].  CCM (Chronic Care Management) Team   Trish Fountain RN, BSN Nurse Care Coordinator  [phone]  Ruben Reason PharmD  Clinical Pharmacist  [phone]   Elliot Gurney, LCSW Clinical Social Worker [phone]  Goals Addressed            This Visit's Progress   . My financial stress is affecting my mental health (pt-stated)       Current Barriers:  Marland Kitchen Knowledge Deficits related to available resources for financial counseling  . Film/video editor.   Nurse Case Manager Clinical Goal(s):  Marland Kitchen Over the next 14 days, patient will report utilization of provided resources to explore options for financial counseling . Over the next 14 days, patient will report taking all medications including antidepressant as prescribed . Over the next 14 days, patient will report receiving appointment with mental health provider  Interventions:  . Spoke with [facility] to discuss reason for patient's referral not being processed . Provided patient with alternative resources as advised by [employer] including [employer] and [employer] . Encouraged patient to contact [employer] to locate a mental health provider in network . Provided emotional support and reassurance  Patient Self Care Activities:  . Self administers medications as prescribed . Attends all scheduled provider appointments . Calls pharmacy for medication refills . Attends church or other social activities . Performs ADL's independently . Performs IADL's independently . Calls provider office for new concerns or questions  Please see past  updates related to this goal by clicking on the "Past Updates" button in the selected goal         The patient verbalized understanding of instructions provided today and declined a print copy of patient instruction materials.   Telephone follow up appointment with care management team member scheduled for:  SYMPTOMS OF A STROKE   You have any symptoms of stroke. "BE FAST" is an easy way to remember the main warning signs: ? B - Balance. Signs are dizziness, sudden trouble walking, or loss of balance. ? E - Eyes. Signs are trouble seeing or a sudden change in how you see. ? F - Face. Signs are sudden weakness or loss of feeling of the face, or the face or eyelid drooping on one side. ? A - Arms. Signs are weakness or loss of feeling in an arm. This happens suddenly and usually on one side of the body. ? S - Speech. Signs are sudden trouble speaking, slurred speech, or trouble understanding what people say. ? T - Time. Time to call emergency services. Write down what time symptoms started.  You have other signs of stroke, such as: ? A sudden, very bad headache with no known cause. ? Feeling sick to your stomach (nausea). ? Throwing up (vomiting). ? Jerky movements you cannot control (seizure).  SYMPTOMS OF A HEART ATTACK  What are the signs or symptoms? Symptoms of this condition include:  Chest pain. It may feel like: ? Crushing or squeezing. ? Tightness, pressure, fullness, or heaviness.  Pain in the arm, neck, jaw, back, or upper body.  Shortness  of breath.  Heartburn.  Indigestion.  Nausea.  Cold sweats.  Feeling tired.  Sudden lightheadedness.

## 2019-04-30 NOTE — Chronic Care Management (AMB) (Signed)
Chronic Care Management   Follow Up Note   04/30/2019 Name: Summer [name] MRN: [id number] DOB: 12-24-1950  Referred by: Steele Sizer, MD Reason for referral : Chronic Care Management (incoming call)   Subjective: "I really need help finding a psychiatrist ASAP"   Objective:  Assessment: Summer [name] is a 68 year old female patient of Dr. Steele Sizer who is well know to this RN CM. She was previously referred to [facility] Team last year for DM management. She is managing her chronic health conditions better and in the process of being worked up for bariatric surgery. She recently was hospitalized for stroke like symptoms and given TPA however all test for stroke were negative therefore patient was referred to psychiatry for mental health assessment. Patient stated she is under an insurmountable amount of stress.  Today, patient contacted CCM RN CM requesting assistance with finding a psychiatrist and therapist that 'will clear me for my bariatric surgery"  Review of patient status, including review of consultants reports, relevant laboratory and other test results, and collaboration with appropriate care team members and the patient's provider was performed as part of comprehensive patient evaluation and provision of chronic care management services.    Goals Addressed            This Visit's Progress    My financial stress is affecting my mental health (pt-stated)       Summer [name] states she needs to find a psychiatrist that will clear her for her bariatric surgery and she is unsure why she has not been contacted by a mental health provider 'because Dr. Ancil Boozer said she would refer me to one".   Per Summer [name], she was seeing a "counselor" through [facility] but told counselor could not clear her for surgery secondary to her recent hospitalization for mental stress and "because I had said at one time I wanted to kill myself". Summer [name] states she called [facility] but told she could not be seen at that  practice. RN CM called to clarify why Summer [name] could not be seen as a referral had been placed. RN CM was told that Summer [name] has been dismissed from the practice secondary to non-follow up and not following treatment plan. [facility] suggest Summer [name] try [facility] or [facility] and also contact her [employer] to find an in-network mental health provider. Summer [name] was notified of her status with [place].    Current Barriers:   Knowledge Deficits related to available resources for financial counseling   Financial Constraints.   Nurse Case Manager Clinical Goal(s):   Over the next 14 days, patient will report utilization of provided resources to explore options for financial counseling  Over the next 14 days, patient will report taking all medications including antidepressant as prescribed  Over the next 14 days, patient will report receiving appointment with mental health provider  Interventions:   Spoke with [facility] to discuss reason for patient's referral not being processed  Provided patient with alternative resources as advised by [facility] including [employer] and [place]  Encouraged patient to contact [place] Medicare to locate a mental health provider in network  Provided emotional support and reassurance  Patient Self Care Activities:   Self administers medications as prescribed  Attends all scheduled provider appointments  Calls pharmacy for medication refills  Attends church or other social activities  Performs ADL's independently  Performs IADL's independently  Calls provider office for new concerns or questions  Initial goal documentation  Telephone follow up appointment with care management team member scheduled for: 05/02/2019 at 2:00    [place]. Rollene Rotunda, RN, BSN Nurse Care Coordinator [facility] Care Management  [phone]

## 2019-05-02 ENCOUNTER — Other Ambulatory Visit: Payer: Self-pay

## 2019-05-02 ENCOUNTER — Telehealth: Payer: Self-pay

## 2019-05-02 ENCOUNTER — Ambulatory Visit (INDEPENDENT_AMBULATORY_CARE_PROVIDER_SITE_OTHER): Payer: Medicare HMO

## 2019-05-02 DIAGNOSIS — F331 Major depressive disorder, recurrent, moderate: Secondary | ICD-10-CM | POA: Diagnosis not present

## 2019-05-02 NOTE — Patient Instructions (Signed)
  Thank you allowing the Chronic Care Management Team to be a part of your care! It was a pleasure speaking with you today!  Please take list of [employer] in-network mental health providers with you to upcoming PCP appointment 05/07/2019 and request referral.   CCM (Chronic Care Management) Team   Trish Fountain RN, BSN Nurse Care Coordinator  [phone]  Ruben Reason PharmD  Clinical Pharmacist  [phone]   [place], LCSW Clinical Social Worker [phone]  Goals Addressed            This Visit's Progress   . My financial stress is affecting my mental health (pt-stated)   On track    Current Barriers:  Marland Kitchen Knowledge Deficits related to available resources for financial counseling  . Film/video editor.   Nurse Case Manager Clinical Goal(s):  Marland Kitchen Over the next 14 days, patient will report utilization of provided resources to explore options for financial counseling- goal unmet . Over the next 14 days, patient will report taking all medications including antidepressant as prescribed-goal met 05/02/2019 . Over the next 14 days, patient will report receiving appointment with mental health provider-in process  Interventions:  . Assessed for utilization of previously provided financial counseling resources . Assessed for follow through of contacting [employer] to find an in-network psychiatrist . Provided emotional support and reassurance  Patient Self Care Activities:  . Will request referral to in-network psychiatry provided from pcp . Calls provider office for new concerns or questions  Please see past updates related to this goal by clicking on the "Past Updates" button in the selected goal         The patient verbalized understanding of instructions provided today and declined a print copy of patient instruction materials.   The patient has been provided with contact information for the care management team and has been advised to call with  any health related questions or concerns.   SYMPTOMS OF A STROKE   You have any symptoms of stroke. "BE FAST" is an easy way to remember the main warning signs: ? B - Balance. Signs are dizziness, sudden trouble walking, or loss of balance. ? E - Eyes. Signs are trouble seeing or a sudden change in how you see. ? F - Face. Signs are sudden weakness or loss of feeling of the face, or the face or eyelid drooping on one side. ? A - Arms. Signs are weakness or loss of feeling in an arm. This happens suddenly and usually on one side of the body. ? S - Speech. Signs are sudden trouble speaking, slurred speech, or trouble understanding what people say. ? T - Time. Time to call emergency services. Write down what time symptoms started.  You have other signs of stroke, such as: ? A sudden, very bad headache with no known cause. ? Feeling sick to your stomach (nausea). ? Throwing up (vomiting). ? Jerky movements you cannot control (seizure).  SYMPTOMS OF A HEART ATTACK  What are the signs or symptoms? Symptoms of this condition include:  Chest pain. It may feel like: ? Crushing or squeezing. ? Tightness, pressure, fullness, or heaviness.  Pain in the arm, neck, jaw, back, or upper body.  Shortness of breath.  Heartburn.  Indigestion.  Nausea.  Cold sweats.  Feeling tired.  Sudden lightheadedness.

## 2019-05-02 NOTE — Telephone Encounter (Signed)
Copied from [place] [id number]. Topic: Referral - Request for Referral >> May 01, 2019 12:36 PM Pauline Good wrote: Has patient seen PCP for this complaint?yes *If NO, is insurance requiring patient see PCP for this issue before PCP can refer them? Referral for which specialty: Therapist and Psychologist  Preferred provider/office: No Reason for referral: Bariatric surgery

## 2019-05-02 NOTE — Chronic Care Management (AMB) (Signed)
Chronic Care Management   Follow Up Note   05/02/2019 Name: Summer [name] MRN: [id number] DOB: 1950/12/24  Referred by: Steele Sizer, MD Reason for referral : Chronic Care Management (follow up psychiatry referral/financial counseling)   Subjective: "I am not going to do financial counseling, I am just going to file for bankruptcy"   Objective:  Assessment: [name] is a 68 year old female patient of Dr. Steele Sizer who is well know to this RN CM. She was previously referred to [facility] Team last year for DM management. She is managing her chronic health conditions better and in the process of being worked up for bariatric surgery. She recently was hospitalized for stroke like symptoms and given TPA however all test for stroke were negative therefore patient was referred to psychiatry for mental health assessment. Patient stated she is under an insurmountable amount of stress.   Review of patient status, including review of consultants reports, relevant laboratory and other test results, and collaboration with appropriate care team members and the patient's provider was performed as part of comprehensive patient evaluation and provision of chronic care management services.    Goals Addressed            This Visit's Progress   . My financial stress is affecting my mental health (pt-stated)   On track    CCM RN CM called to follow up with [name] to determine if she utilized provided financial counseling resources and to see if she contacted [place], [employer], and [employer] as previously discussed. Summer [name] has decided not to utilize financial counseling and just file for bankruptcy. She wants to be debt free and the only way to accomplish this rapidly is through bankruptcy.  She called [facility] but told they did not take [employer] as payment for [facility] services. She did not contact [place] as "I am just frustrated with this whole process". She has called [employer]  and received a list of in-network mental health providers. She has called PCP office and left message requesting return call as she will need a referral placed. Her plans are to take list of providers with her to her upcoming PCP appointment 05/07/2019 for referral request.   Current Barriers:  Marland Kitchen Knowledge Deficits related to available resources for financial counseling  . Film/video editor.   Nurse Case Manager Clinical Goal(s):  Marland Kitchen Over the next 14 days, patient will report utilization of provided resources to explore options for financial counseling- goal unmet . Over the next 14 days, patient will report taking all medications including antidepressant as prescribed-goal met 05/02/2019 . Over the next 14 days, patient will report receiving appointment with mental health provider-in process  Interventions:  . Assessed for utilization of previously provided financial counseling resources . Assessed for follow through of contacting [employer] to find an in-network psychiatrist . Provided emotional support and reassurance  Patient Self Care Activities:  . Will request referral to in-network psychiatry provided from pcp . Calls provider office for new concerns or questions  Please see past updates related to this goal by clicking on the "Past Updates" button in the selected goal          The care management team will reach out to the patient again over the next 30 days.  The patient has been provided with contact information for the care management team and has been advised to call with any health related questions or concerns.     Yeison Sippel E. Rollene Rotunda,  RN, Archivist / [facility]  [phone]

## 2019-05-05 NOTE — Telephone Encounter (Signed)
Patient states she is coming in Wednesday to see Dr. Ancil Boozer and will go in further detail. Her boyfriend went off the deep end she stated and her psychiatrist won't schedule any more appointments because she missed too many. She will bring a list of whom her insurance will cover for therapist and psychiatrist on her appointment day.

## 2019-05-07 ENCOUNTER — Ambulatory Visit (INDEPENDENT_AMBULATORY_CARE_PROVIDER_SITE_OTHER): Payer: Medicare HMO | Admitting: Family Medicine

## 2019-05-07 ENCOUNTER — Other Ambulatory Visit: Payer: Self-pay

## 2019-05-07 ENCOUNTER — Encounter: Payer: Self-pay | Admitting: Family Medicine

## 2019-05-07 VITALS — BP 102/62 | HR 77 | Temp 97.3°F | Resp 16 | Ht 62.0 in | Wt 254.1 lb

## 2019-05-07 DIAGNOSIS — Z6841 Body Mass Index (BMI) 40.0 and over, adult: Secondary | ICD-10-CM

## 2019-05-07 DIAGNOSIS — E1169 Type 2 diabetes mellitus with other specified complication: Secondary | ICD-10-CM | POA: Diagnosis not present

## 2019-05-07 DIAGNOSIS — F331 Major depressive disorder, recurrent, moderate: Secondary | ICD-10-CM

## 2019-05-07 DIAGNOSIS — F5105 Insomnia due to other mental disorder: Secondary | ICD-10-CM | POA: Diagnosis not present

## 2019-05-07 DIAGNOSIS — J4520 Mild intermittent asthma, uncomplicated: Secondary | ICD-10-CM

## 2019-05-07 DIAGNOSIS — G4733 Obstructive sleep apnea (adult) (pediatric): Secondary | ICD-10-CM | POA: Diagnosis not present

## 2019-05-07 DIAGNOSIS — F99 Mental disorder, not otherwise specified: Secondary | ICD-10-CM

## 2019-05-07 DIAGNOSIS — E039 Hypothyroidism, unspecified: Secondary | ICD-10-CM

## 2019-05-07 DIAGNOSIS — N183 Chronic kidney disease, stage 3 unspecified: Secondary | ICD-10-CM

## 2019-05-07 DIAGNOSIS — I10 Essential (primary) hypertension: Secondary | ICD-10-CM | POA: Diagnosis not present

## 2019-05-07 DIAGNOSIS — E785 Hyperlipidemia, unspecified: Secondary | ICD-10-CM

## 2019-05-07 MED ORDER — MONTELUKAST SODIUM 10 MG PO TABS: 10.0000 mg | ORAL_TABLET | Freq: Every day | ORAL | 1 refills | Status: DC

## 2019-05-07 MED ORDER — DULOXETINE HCL 30 MG PO CPEP: 30.0000 mg | ORAL_CAPSULE | Freq: Every day | ORAL | 3 refills | Status: DC

## 2019-05-07 MED ORDER — AMLODIPINE BESYLATE 5 MG PO TABS: 5.0000 mg | ORAL_TABLET | Freq: Every day | ORAL | 0 refills | Status: DC

## 2019-05-07 MED ORDER — METOPROLOL TARTRATE 25 MG PO TABS: 25.0000 mg | ORAL_TABLET | Freq: Two times a day (BID) | ORAL | 1 refills | Status: DC

## 2019-05-07 MED ORDER — ROSUVASTATIN CALCIUM 20 MG PO TABS: 20.0000 mg | ORAL_TABLET | Freq: Every day | ORAL | 1 refills | Status: DC

## 2019-05-07 MED ORDER — TRAZODONE HCL 50 MG PO TABS: 50.0000 mg | ORAL_TABLET | Freq: Every evening | ORAL | 1 refills | Status: DC | PRN

## 2019-05-07 MED ORDER — SERTRALINE HCL 50 MG PO TABS: 50.0000 mg | ORAL_TABLET | Freq: Every day | ORAL | 0 refills | Status: DC

## 2019-05-07 MED ORDER — VASCEPA 1 G PO CAPS: 2.0000 g | ORAL_CAPSULE | Freq: Two times a day (BID) | ORAL | 1 refills | Status: DC

## 2019-05-07 NOTE — Progress Notes (Signed)
Name: Summer [name]   MRN: [id number]    DOB: [birth year]   Date:05/07/2019       Progress Note  Subjective  Chief Complaint  Chief Complaint  Patient presents with  . Medication Refill  . Diabetes    Checks once daily Lowest-94 Average 141  . Asthma    Well controlled  . Hypertension    Edema in feet and ankles  . Hyperlipidemia  . Chronic Kidney Disease  . Obesity  . Depression    HPI  DMII: she is seeing Dr. Manfred Shirts, last A1C in December was 8.1% but during hospital stay in July it was down to 7.2% .Sheis really trying to lose weight and wants to have bariatric surgery Sheis taking  70/30 insulin 100 units BID , and Lyrica through medication assistance program. She is also taking Metformin. She has polyphagia, polydipsia andnocturia, She has CKI stage III, dyslipidemia, neuropathy  and had a history of foot ulcer but recently seen by Dr. Prudence Davidson . She is still under the care of nephrologist   CKIIII: she is under the care of [facility] and is getting monitored , past two levels were stable. She states recent GFR at [facility] was  40  HTN:She has SOB with activity, no chest pain. She states leg not as swollen , she was given norvasc in Nov by nephrologist, she had two doses on her chart 2.5 mg and 5 mg, on her pillpack list she is taking 5 mg, bp in our office is low but she states higher at home, advised to monitor with half dose and let me know if we need to decrease dose. Advised to reach out to her nephrologist also   Major Depression: still feels down all the time, Phq9 is down to 18 ,she went to psychiatrist and also therapist but cannot afford going back because of cost, and missed too many appointments at [facility] and cannot go back to see them, [facility] does not accept her insurance. She went to [facility] back in 03/2019 with stroke symptoms , receive tPA but evaluation was negative, psychiatrist was consulted and she was placed on zoloft 25 and lower dose of duloxetine and she  feels better, phq 9 slightly lower than last visit, she is frustrated because she wants to have bariatric surgery. Denies suicidal thoughts or ideation   Hyperlipidemia: continue crestor, no muscle aches, has joint aches from OA, . Triglycerides was very high, and is now on Vascepa , reviewed labs done in July and it has improved   Morbid obesity: high bmi and co-morbidities.She has DM, OA, GERD, HTN, she is going to [facility] and want to have bariatric surgery in 2020 if possible,, tried multiple diets in the past, difficulty exercising because of SOB and joint pains.She had  joined [employer] and was going 3 times a week, but since COVID-19 she has only been able to walk around her house for at most 5-10 minutes before she takes a break. She is only pending now is evaluation by psychiatrist and therapist prior to having surgery.   Hypothyroidism: taking one daily and two on sundays, no hair loss, no dry skin, no change in bowel movements, no dysphagia. She is due for repeat TSH    Patient Active Problem List   Diagnosis Date Noted  . Major depressive disorder, recurrent episode, moderate ([place]) 04/04/2019  . Left arm weakness 04/03/2019  . (HFpEF) heart failure with preserved ejection fraction ([place]) 08/31/2017  . Moderate  persistent asthma 08/31/2017  . Hyperlipidemia due to type 2 diabetes mellitus ([place]) 06/14/2017  . Essential hypertension 10/06/2016  . Morbid obesity with BMI of 40.0-44.9, adult ([place]) 10/06/2016  . Dyslipidemia associated with type 2 diabetes mellitus ([place]) 08/18/2016  . Charcot foot due to diabetes mellitus ([place]) 02/22/2016  . Hypertriglyceridemia 07/27/2015  . Acquired abduction deformity of foot 07/12/2015  . Osteoarthritis of subtalar joint 07/12/2015  . Poorly controlled type 2 diabetes mellitus with neuropathy ([place]) 07/12/2015  . Arthritis of foot, degenerative 07/12/2015  . Carpal tunnel syndrome 04/17/2015  . Chronic constipation  04/17/2015  . Insomnia, persistent 04/17/2015  . Chronic kidney disease (CKD), stage III (moderate) ([place]) 04/17/2015  . Decreased exercise tolerance 04/17/2015  . Diabetes mellitus with polyneuropathy ([place]) 04/17/2015  . Gastro-esophageal reflux disease without esophagitis 04/17/2015  . Bursitis, trochanteric 04/17/2015  . Cephalalgia 04/17/2015  . Benign hypertension 04/17/2015  . Adult hypothyroidism 04/17/2015  . Hearing loss 04/17/2015  . Chronic recurrent major depressive disorder ([place]) 04/17/2015  . Neurogenic claudication 04/17/2015  . Obesity (BMI 35.0-39.9 without comorbidity) 04/17/2015  . Hypo-ovarianism 04/17/2015  . Perennial allergic rhinitis with seasonal variation 04/17/2015  . Acne erythematosa 04/17/2015  . Dyskinesia, tardive 04/17/2015  . Memory loss 04/17/2015  . Impingement syndrome of shoulder 04/17/2015  . Dermatitis, stasis 04/17/2015  . Obstructive sleep apnea 05/14/2014  . Dyspnea 05/06/2014  . Hyperlipidemia 02/06/2012  . LBP (low back pain) 09/16/2008    Past Surgical History:  Procedure Laterality Date  . ANKLE SURGERY Left approx Jan 2018  . CATARACT EXTRACTION  01/2011   right  . eye lid surgery  2013   bilateral  . FOOT SURGERY    . NECK SURGERY    . TUBAL LIGATION    . VAGINAL HYSTERECTOMY  1989    Family History  Problem Relation Age of Onset  . Heart attack Mother   . Aneurysm Mother   . Heart attack Maternal Grandfather   . Diabetes Maternal Grandfather     Social History   Socioeconomic History  . Marital status: Significant Other    Spouse name: Not on file  . Number of children: 1  . Years of education: Not on file  . Highest education level: High school graduate  Occupational History  . Occupation: retired  Scientific laboratory technician  . Financial resource strain: Not hard at all  . Food insecurity    Worry: Never true    Inability: Never true  . Transportation needs    Medical: No    Non-medical: No  Tobacco Use  . Smoking  status: Never Smoker  . Smokeless tobacco: Never Used  Substance and Sexual Activity  . Alcohol use: No    Alcohol/week: 0.0 standard drinks    Frequency: Never  . Drug use: No  . Sexual activity: Yes    Partners: Male  Lifestyle  . Physical activity    Days per week: 0 days    Minutes per session: 0 min  . Stress: Rather much  Relationships  . Social connections    Talks on phone: More than three times a week    Gets together: Once a week    Attends religious service: Never    Active member of club or organization: No    Attends meetings of clubs or organizations: Never    Relationship status: Living with partner  . Intimate partner violence    Fear of current or ex partner: No    Emotionally abused: No  Physically abused: No    Forced sexual activity: No  Other Topics Concern  . Not on file  Social History Narrative   Son lives in [place] (retired from [employer]) and her granddaughter (43yr old) [place].     Current Outpatient Medications:  .  ACCU-CHEK SOFTCLIX LANCETS lancets, , Disp: , Rfl:  .  acetaminophen (TYLENOL 8 HOUR) 650 MG CR tablet, Take 1 tablet (650 mg total) by mouth every 8 (eight) hours as needed for pain., Disp: 90 tablet, Rfl: 2 .  albuterol (PROVENTIL HFA) 108 (90 Base) MCG/ACT inhaler, Inhale 2 puffs into the lungs 2 (two) times daily as needed for shortness of breath. , Disp: , Rfl:  .  Alcohol Swabs (B-D SINGLE USE SWABS REGULAR) PADS, , Disp: , Rfl:  .  amLODipine (NORVASC) 5 MG tablet, Take 1 tablet (5 mg total) by mouth daily., Disp: 30 tablet, Rfl: 0 .  ASPIRIN LOW DOSE 81 MG EC tablet, TAKE 1 TABLET (81 MG TOTAL) BY MOUTH DAILY., Disp: 30 tablet, Rfl: 0 .  Blood Glucose Monitoring Suppl (ACCU-CHEK AVIVA PLUS) w/Device KIT, , Disp: , Rfl:  .  Cyanocobalamin (VITAMIN B12 PO), Take 1 tablet by mouth daily. , Disp: , Rfl:  .  DULoxetine (CYMBALTA) 30 MG capsule, Take 1 capsule (30 mg total) by mouth daily., Disp: 30  capsule, Rfl: 3 .  furosemide (LASIX) 20 MG tablet, Take 1 tablet (20 mg total) by mouth daily., Disp: 90 tablet, Rfl: 0 .  Icosapent Ethyl (VASCEPA) 1 g CAPS, Take 2 capsules (2 g total) by mouth 2 (two) times daily., Disp: 360 capsule, Rfl: 1 .  insulin NPH-regular Human (70-30) 100 UNIT/ML injection, Inject 100 Units into the skin 2 (two) times daily with a meal., Disp: , Rfl:  .  irbesartan-hydrochlorothiazide (AVALIDE) 300-12.5 MG tablet, Take 1 tablet by mouth daily., Disp: 90 tablet, Rfl: 1 .  levothyroxine (SYNTHROID) 25 MCG tablet, One daily and two on sundays (Patient taking differently: Take 25-50 mcg by mouth daily before breakfast. One tablet daily and two tablets on Sundays), Disp: 100 tablet, Rfl: 0 .  metFORMIN (GLUCOPHAGE-XR) 500 MG 24 hr tablet, Take 500 mg by mouth. 2 tablets daily (total of 1000 mg daily), Disp: , Rfl:  .  metoprolol tartrate (LOPRESSOR) 25 MG tablet, Take 1 tablet (25 mg total) by mouth 2 (two) times daily., Disp: 180 tablet, Rfl: 1 .  Misc Natural Products (OSTEO BI-FLEX ADV JOINT SHIELD PO), Take by mouth 2 (two) times a week., Disp: , Rfl:  .  montelukast (SINGULAIR) 10 MG tablet, Take 1 tablet (10 mg total) by mouth at bedtime., Disp: 90 tablet, Rfl: 1 .  Omega-3 Fatty Acids (FISH OIL) 1000 MG CAPS, Take 1,000 mg by mouth daily. , Disp: , Rfl:  .  omeprazole (PRILOSEC) 40 MG capsule, Take 1 capsule (40 mg total) by mouth daily., Disp: 90 capsule, Rfl: 1 .  Potassium 99 MG TABS, Take by mouth., Disp: , Rfl:  .  pregabalin (LYRICA) 300 MG capsule, Take 300 mg by mouth 2 (two) times a day., Disp: , Rfl:  .  Probiotic Product (PROBIOTIC-10 PO), Take by mouth., Disp: , Rfl:  .  RELION INSULIN SYRINGE 1ML/31G 31G X 5/16" 1 ML MISC, , Disp: , Rfl:  .  rosuvastatin (CRESTOR) 20 MG tablet, Take 1 tablet (20 mg total) by mouth daily., Disp: 90 tablet, Rfl: 1 .  sertraline (ZOLOFT) 50 MG tablet, Take 1 tablet (50  mg total) by mouth daily., Disp: 90 tablet, Rfl: 0 .   traZODone (DESYREL) 50 MG tablet, Take 1 tablet (50 mg total) by mouth at bedtime as needed for sleep., Disp: 90 tablet, Rfl: 1 .  vitamin C (ASCORBIC ACID) 500 MG tablet, Take 500 mg by mouth 2 (two) times daily. , Disp: , Rfl:  .  VITAMIN D, CHOLECALCIFEROL, PO, Take 1,000 Units by mouth daily. , Disp: , Rfl:   Allergies  Allergen Reactions  . Codeine Other (See Comments)    "TRIPPED OUT"  DIDN'T LIKE THE [place]  . Atorvastatin     muscle pain  . Hydrocodone     itching  . Latex Rash  . Zolpidem Other (See Comments)    Sleep walk    I personally reviewed active problem list, medication list, allergies, family history, social history, health maintenance with the patient/caregiver today.   ROS  Constitutional: Negative for fever or weight change.  Respiratory: Negative for cough , she has chronic  shortness of breath seen by pulmonologist and cardiologist in the past .   Cardiovascular: Negative for chest pain or palpitations.  Gastrointestinal: Negative for abdominal pain, no bowel changes.  Musculoskeletal: Positive  for gait problem but no  joint swelling.  Skin: Negative for rash.  Neurological: Negative for dizziness or headache.  No other specific complaints in a complete review of systems (except as listed in HPI above).  Objective  Vitals:   05/07/19 1340  BP: 102/62  Pulse: 77  Resp: 16  Temp: (!) 97.3 F (36.3 C)  TempSrc: Temporal  SpO2: 99%  Weight: 254 lb 1.6 oz (115.3 kg)  Height: _0  (1.575 m)    Body mass index is 46.48 kg/m.  Physical Exam  Constitutional: Patient appears well-developed and well-nourished. Obese  No distress.  HEENT: head atraumatic, normocephalic, pupils equal and reactive to light Cardiovascular: Normal rate, regular rhythm and normal heart sounds.  No murmur heard. Trace  BLE edema. Pulmonary/Chest: Effort normal and breath sounds normal. No respiratory distress. Abdominal: Soft.  There is no tenderness. Psychiatric:  Patient has a normal mood and affect. behavior is normal. Judgment and thought content normal.  Recent Results (from the past 2160 hour(s))  Glucose, capillary     Status: Abnormal   Collection Time: 04/02/19  5:28 PM  Result Value Ref Range   Glucose-Capillary 112 (H) 70 - 99 mg/dL   Comment 1 Notify RN    Comment 2 Document in Chart   Protime-INR     Status: None   Collection Time: 04/02/19  5:43 PM  Result Value Ref Range   Prothrombin Time 13.0 11.4 - 15.2 seconds   INR 1.0 0.8 - 1.2    Comment: (NOTE) INR goal varies based on device and disease states. Performed at [facility], [address] [place], [place] [address]   APTT     Status: None   Collection Time: 04/02/19  5:43 PM  Result Value Ref Range   aPTT 27 24 - 36 seconds    Comment: Performed at [facility], [address] [place], [place] [address]  CBC     Status: Abnormal   Collection Time: 04/02/19  5:43 PM  Result Value Ref Range   WBC 9.2 4.0 - 10.5 K/uL   RBC 4.03 3.87 - 5.11 MIL/uL   Hemoglobin 11.8 (L) 12.0 - 15.0 g/dL   HCT 35.3 (L) 36.0 - 46.0 %   MCV 87.6 80.0 - 100.0  fL   MCH 29.3 26.0 - 34.0 pg   MCHC 33.4 30.0 - 36.0 g/dL   RDW 15.6 (H) 11.5 - 15.5 %   Platelets 254 150 - 400 K/uL   nRBC 0.0 0.0 - 0.2 %    Comment: Performed at [facility], [address] [place], [place] [address]  Differential     Status: Abnormal   Collection Time: 04/02/19  5:43 PM  Result Value Ref Range   Neutrophils Relative % 59 %   Neutro Abs 5.4 1.7 - 7.7 K/uL   Lymphocytes Relative 24 %   Lymphs Abs 2.2 0.7 - 4.0 K/uL   Monocytes Relative 11 %   Monocytes Absolute 1.0 0.1 - 1.0 K/uL   Eosinophils Relative 4 %   Eosinophils Absolute 0.4 0.0 - 0.5 K/uL   Basophils Relative 1 %   Basophils Absolute 0.1 0.0 - 0.1 K/uL   Immature Granulocytes 1 %   Abs Immature Granulocytes 0.09 (H) 0.00 - 0.07 K/uL    Comment: Performed at [facility], [address]  [place], [place] [address]  Comprehensive metabolic panel     Status: Abnormal   Collection Time: 04/02/19  5:43 PM  Result Value Ref Range   Sodium 141 135 - 145 mmol/L   Potassium 4.2 3.5 - 5.1 mmol/L   Chloride 103 98 - 111 mmol/L   CO2 24 22 - 32 mmol/L   Glucose, Bld 109 (H) 70 - 99 mg/dL   BUN 45 (H) 8 - 23 mg/dL   Creatinine, Ser 1.35 (H) 0.44 - 1.00 mg/dL   Calcium 9.6 8.9 - 10.3 mg/dL   Total Protein 7.1 6.5 - 8.1 g/dL   Albumin 4.4 3.5 - 5.0 g/dL   AST 29 15 - 41 U/L   ALT 32 0 - 44 U/L   Alkaline Phosphatase 77 38 - 126 U/L   Total Bilirubin 0.6 0.3 - 1.2 mg/dL   GFR calc non Af Amer 40 (L) >60 mL/min   GFR calc Af Amer 46 (L) >60 mL/min   Anion gap 14 5 - 15    Comment: Performed at [facility], [address] [place], [place] [address]  Lipid panel     Status: Abnormal   Collection Time: 04/02/19  5:43 PM  Result Value Ref Range   Cholesterol 152 0 - 200 mg/dL   Triglycerides 201 (H) <150 mg/dL   HDL 32 (L) >40 mg/dL   Total CHOL/HDL Ratio 4.8 RATIO   VLDL 40 0 - 40 mg/dL   LDL Cholesterol 80 0 - 99 mg/dL    Comment:        Total Cholesterol/HDL:CHD Risk Coronary Heart Disease Risk Table                     Men   Women  1/2 Average Risk   3.4   3.3  Average Risk       5.0   4.4  2 X Average Risk   9.6   7.1  3 X Average Risk  23.4   11.0        Use the calculated Patient Ratio above and the CHD Risk Table to determine the patient's CHD Risk.        ATP III CLASSIFICATION (LDL):  <100     mg/dL   Optimal  100-129  mg/dL   Near or Above  Optimal  130-159  mg/dL   Borderline  160-189  mg/dL   High  >190     mg/dL   Very High Performed at [facility], [address] [place], [place] [address]   Hemoglobin A1c     Status: Abnormal   Collection Time: 04/02/19  5:43 PM  Result Value Ref Range   Hgb A1c MFr Bld 7.2 (H) 4.8 - 5.6 %    Comment: (NOTE) Pre diabetes:          5.7%-6.4% Diabetes:               >6.4% Glycemic control for   <7.0% adults with diabetes    Mean Plasma Glucose 159.94 mg/dL    Comment: Performed at [facility] [address] [place], [place] [address]  Novel Coronavirus,NAA,(SEND-OUT TO REF LAB - TAT 24-48 hrs); Hosp Order     Status: None   Collection Time: 04/02/19  6:20 PM   Specimen: Nasopharyngeal Swab; Respiratory  Result Value Ref Range   SARS-CoV-2, NAA NOT DETECTED NOT DETECTED    Comment: (NOTE) This test was developed and its performance characteristics determined by [employer]. This test has not been FDA cleared or approved. This test has been authorized by FDA under an Emergency Use Authorization (EUA). This test is only authorized for the duration of time the declaration that circumstances exist justifying the authorization of the emergency use of in vitro diagnostic tests for detection of SARS-CoV-2 virus and/or diagnosis of COVID-19 infection under section 564(b)(1) of the Act, 21 U.S.C. 741OIN-8(M)(7), unless the authorization is terminated or revoked sooner. When diagnostic testing is negative, the possibility of a false negative result should be considered in the context of a patient's recent exposures and the presence of clinical signs and symptoms consistent with COVID-19. An individual without symptoms of COVID-19 and who is not shedding SARS-CoV-2 virus would expect to have a negative (not detected) result in this assay. Performed  At: [facility] [address] [place], [place] [id number] Rush Farmer MD GG:[id number]    Coronavirus Source NASOPHARYNGEAL     Comment: Performed at [facility], [address] [place], [place] [address]  SARS Coronavirus 2 (CEPHEID- Performed in [facility] hospital lab), Hosp Order     Status: None   Collection Time: 04/02/19  8:35 PM   Specimen: Nasopharyngeal Swab  Result Value Ref Range   SARS Coronavirus 2 NEGATIVE NEGATIVE    Comment: (NOTE) If result is  NEGATIVE SARS-CoV-2 target nucleic acids are NOT DETECTED. The SARS-CoV-2 RNA is generally detectable in upper and lower  respiratory specimens during the acute phase of infection. The lowest  concentration of SARS-CoV-2 viral copies this assay can detect is 250  copies / mL. A negative result does not preclude SARS-CoV-2 infection  and should not be used as the sole basis for treatment or other  patient management decisions.  A negative result may occur with  improper specimen collection / handling, submission of specimen other  than nasopharyngeal swab, presence of viral mutation(s) within the  areas targeted by this assay, and inadequate number of viral copies  (<250 copies / mL). A negative result must be combined with clinical  observations, patient history, and epidemiological information. If result is POSITIVE SARS-CoV-2 target nucleic acids are DETECTED. The SARS-CoV-2 RNA is generally detectable in upper and lower  respiratory specimens dur ing the acute phase of infection.  Positive  results are indicative of active infection with SARS-CoV-2.  Clinical  correlation with patient history and other diagnostic information is  necessary to determine patient infection status.  Positive results do  not rule out bacterial infection or co-infection with other viruses. If result is PRESUMPTIVE POSTIVE SARS-CoV-2 nucleic acids MAY BE PRESENT.   A presumptive positive result was obtained on the submitted specimen  and confirmed on repeat testing.  While 2019 novel coronavirus  (SARS-CoV-2) nucleic acids may be present in the submitted sample  additional confirmatory testing may be necessary for epidemiological  and / or clinical management purposes  to differentiate between  SARS-CoV-2 and other Sarbecovirus currently known to infect humans.  If clinically indicated additional testing with an alternate test  methodology [phone]) is advised. The SARS-CoV-2 RNA is generally  detectable  in upper and lower respiratory sp ecimens during the acute  phase of infection. The expected result is Negative. Fact Sheet for Patients:  StrictlyIdeas.no Fact Sheet for Healthcare Providers: BankingDealers.co.za This test is not yet approved or cleared by the [place] FDA and has been authorized for detection and/or diagnosis of SARS-CoV-2 by FDA under an Emergency Use Authorization (EUA).  This EUA will remain in effect (meaning this test can be used) for the duration of the COVID-19 declaration under Section 564(b)(1) of the Act, 21 U.S.C. section 360bbb-3(b)(1), unless the authorization is terminated or revoked sooner. Performed at [facility], [address] [place], [place] [address]   MRSA PCR Screening     Status: None   Collection Time: 04/03/19  1:58 AM   Specimen: Nasal Mucosa; Nasopharyngeal  Result Value Ref Range   MRSA by PCR NEGATIVE NEGATIVE    Comment:        The GeneXpert MRSA Assay (FDA approved for NASAL specimens only), is one component of a comprehensive MRSA colonization surveillance program. It is not intended to diagnose MRSA infection nor to guide or monitor treatment for MRSA infections. Performed at [facility], [address] [place], [place] [address]   Glucose, capillary     Status: Abnormal   Collection Time: 04/03/19  2:15 AM  Result Value Ref Range   Glucose-Capillary 164 (H) 70 - 99 mg/dL  HIV Antibody (routine testing w rflx)     Status: None   Collection Time: 04/03/19  4:07 AM  Result Value Ref Range   HIV Screen 4th Generation wRfx Non Reactive Non Reactive    Comment: (NOTE) Performed At: [facility] [address] [place], [place] [id number] Rush Farmer MD SN:[id number]   Glucose, capillary     Status: Abnormal   Collection Time: 04/03/19  7:55 AM  Result Value Ref Range   Glucose-Capillary 144 (H) 70 - 99 mg/dL  ECHOCARDIOGRAM COMPLETE      Status: None   Collection Time: 04/03/19  9:13 AM  Result Value Ref Range   Weight 4,031.77 oz   Height 62 in   BP 131/51 mmHg  Glucose, capillary     Status: Abnormal   Collection Time: 04/03/19 10:46 AM  Result Value Ref Range   Glucose-Capillary 294 (H) 70 - 99 mg/dL  Glucose, capillary     Status: Abnormal   Collection Time: 04/03/19  3:18 PM  Result Value Ref Range   Glucose-Capillary 230 (H) 70 - 99 mg/dL  Glucose, capillary     Status: Abnormal   Collection Time: 04/03/19 10:58 PM  Result Value Ref Range   Glucose-Capillary 229 (H) 70 - 99 mg/dL  Glucose, capillary     Status: Abnormal  Collection Time: 04/04/19  7:48 AM  Result Value Ref Range   Glucose-Capillary 209 (H) 70 - 99 mg/dL  Glucose, capillary     Status: Abnormal   Collection Time: 04/04/19 11:46 AM  Result Value Ref Range   Glucose-Capillary 354 (H) 70 - 99 mg/dL     PHQ2/9: Depression screen [facility] 2/9 05/07/2019 04/14/2019 12/16/2018 12/02/2018 10/16/2018  Decreased Interest _0 Down, Depressed, Hopeless _1 PHQ - 2 Score _2 Altered sleeping _3 Tired, decreased energy _4 Change in appetite _5 Feeling bad or failure about yourself  _6 Trouble concentrating _7 Moving slowly or fidgety/restless 1 2 0 0 2  Suicidal thoughts _8 PHQ-9 Score _9 Difficult doing work/chores Somewhat difficult Very difficult Very difficult Very difficult Very difficult  Some recent data might be hidden    phq 9 is positive   Fall Risk: Fall Risk  05/07/2019 04/14/2019 03/20/2019 02/20/2019 01/22/2019  Falls in the past year? 0 0 0 0 0  Comment - - - - -  Number falls in past yr: 0 0 - - -  Injury with Fall? 0 0 - - -  Risk Factor Category  - - - - -  Comment - - - - -  Risk for fall due to : - - - - -  Risk for fall due to: Comment - - - - -  Follow up - - - - -    Functional Status Survey: Is the patient deaf or have difficulty hearing?:  No Does the patient have difficulty seeing, even when wearing glasses/contacts?: Yes Does the patient have difficulty concentrating, remembering, or making decisions?: No Does the patient have difficulty walking or climbing stairs?: Yes Does the patient have difficulty dressing or bathing?: Yes Does the patient have difficulty doing errands alone such as visiting a doctor's office or shopping?: No    Assessment & Plan  1. MDD (major depressive disorder), recurrent episode, moderate ([place])  - Ambulatory referral to Psychiatry - sertraline (ZOLOFT) 50 MG tablet; Take 1 tablet (50 mg total) by mouth daily.  Dispense: 90 tablet; Refill: 0  2. Morbid obesity with BMI of 40.0-44.9, adult [facility])  Discussed with the patient the risk posed by an increased BMI. Discussed importance of portion control, calorie counting and at least 150 minutes of physical activity weekly. Avoid sweet beverages and drink more water. Eat at least 6 servings of fruit and vegetables daily   3. Dyslipidemia associated with type 2 diabetes mellitus ([name])  - rosuvastatin (CRESTOR) 20 MG tablet; Take 1 tablet (20 mg total) by mouth daily.  Dispense: 90 tablet; Refill: 1  4. Chronic kidney disease (CKD), stage III (moderate) ([name])  Under the care of nephrologist   5. Benign hypertension  - metoprolol tartrate (LOPRESSOR) 25 MG tablet; Take 1 tablet (25 mg total) by mouth 2 (two) times daily.  Dispense: 180 tablet; Refill: 1  6. Adult hypothyroidism  - TSH  7. Obstructive sleep apnea  Not compliant with cpap  8. Mild  persistent asthma  in adult  - montelukast (SINGULAIR) 10 MG tablet; Take 1 tablet (10 mg total) by mouth at bedtime.  Dispense: 90 tablet; Refill: 1  9. Insomnia due to other mental disorder  -  traZODone (DESYREL) 50 MG tablet; Take 1 tablet (50 mg total) by mouth at bedtime as needed for sleep.  Dispense: 90 tablet; Refill: 1

## 2019-05-08 LAB — TSH: TSH: 1.1 mIU/L (ref 0.40–4.50)

## 2019-05-12 ENCOUNTER — Other Ambulatory Visit: Payer: Self-pay | Admitting: Family Medicine

## 2019-05-12 MED ORDER — AMLODIPINE BESYLATE 5 MG PO TABS: 5.0000 mg | ORAL_TABLET | Freq: Every day | ORAL | 0 refills | Status: DC

## 2019-05-28 ENCOUNTER — Telehealth: Payer: Self-pay

## 2019-05-28 DIAGNOSIS — F331 Major depressive disorder, recurrent, moderate: Secondary | ICD-10-CM

## 2019-05-28 NOTE — Telephone Encounter (Signed)
Copied from [place] [phone]. Topic: Referral - Request for Referral >> May 28, 2019 10:34 AM Virl Axe D wrote: Has patient seen PCP for this complaint? yes *If NO, is insurance requiring patient see PCP for this issue before PCP can refer them? Referral for which specialty: Psychiatry Preferred provider/office: [facility] Reason for referral: pt needs a referral because she has not been there in a year

## 2019-05-29 ENCOUNTER — Other Ambulatory Visit: Payer: Self-pay

## 2019-05-29 ENCOUNTER — Ambulatory Visit: Payer: Medicare HMO | Admitting: Family Medicine

## 2019-05-29 NOTE — Addendum Note (Signed)
Addended by: Marland Kitchen A on: 05/29/2019 01:52 PM   Modules accepted: Orders

## 2019-06-03 ENCOUNTER — Other Ambulatory Visit: Payer: Self-pay

## 2019-06-03 ENCOUNTER — Telehealth: Payer: Self-pay

## 2019-06-03 ENCOUNTER — Ambulatory Visit (INDEPENDENT_AMBULATORY_CARE_PROVIDER_SITE_OTHER): Payer: Medicare HMO

## 2019-06-03 DIAGNOSIS — Z23 Encounter for immunization: Secondary | ICD-10-CM | POA: Diagnosis not present

## 2019-06-03 NOTE — Telephone Encounter (Signed)
I tried to contact this patient to inform her that in order for me to process her psy referral that she will need to contact her insurance office to get a list of providers that we can send the referral to but there was no answer. A message was left for her with that information on her voicemail and I asked her to give [place] a call back once she has obtained that information.  Patient returned my call prior to me being able to finish my note.  Patient stated that it has been a misunderstanding and she believes she does not have to have a psy referral but she will check to make sure and will get back with [place]. I told her that will be fine.

## 2019-06-06 ENCOUNTER — Encounter: Payer: Self-pay | Admitting: Family Medicine

## 2019-06-25 DIAGNOSIS — H35342 Macular cyst, hole, or pseudohole, left eye: Secondary | ICD-10-CM | POA: Diagnosis not present

## 2019-06-25 LAB — HM DIABETES EYE EXAM

## 2019-06-27 ENCOUNTER — Encounter: Payer: Self-pay | Admitting: Family Medicine

## 2019-06-30 ENCOUNTER — Other Ambulatory Visit: Payer: Self-pay | Admitting: Family Medicine

## 2019-06-30 DIAGNOSIS — E039 Hypothyroidism, unspecified: Secondary | ICD-10-CM

## 2019-07-01 ENCOUNTER — Other Ambulatory Visit: Payer: Self-pay | Admitting: Family Medicine

## 2019-07-01 DIAGNOSIS — Z1231 Encounter for screening mammogram for malignant neoplasm of breast: Secondary | ICD-10-CM

## 2019-07-10 DIAGNOSIS — E1122 Type 2 diabetes mellitus with diabetic chronic kidney disease: Secondary | ICD-10-CM | POA: Diagnosis not present

## 2019-07-10 DIAGNOSIS — I10 Essential (primary) hypertension: Secondary | ICD-10-CM | POA: Diagnosis not present

## 2019-07-10 DIAGNOSIS — N1831 Chronic kidney disease, stage 3a: Secondary | ICD-10-CM | POA: Diagnosis not present

## 2019-07-10 DIAGNOSIS — R809 Proteinuria, unspecified: Secondary | ICD-10-CM | POA: Diagnosis not present

## 2019-07-15 ENCOUNTER — Encounter: Payer: Self-pay | Admitting: Family Medicine

## 2019-07-15 ENCOUNTER — Telehealth (INDEPENDENT_AMBULATORY_CARE_PROVIDER_SITE_OTHER): Payer: Medicare HMO | Admitting: Family Medicine

## 2019-07-15 DIAGNOSIS — R0602 Shortness of breath: Secondary | ICD-10-CM

## 2019-07-15 DIAGNOSIS — Z01818 Encounter for other preprocedural examination: Secondary | ICD-10-CM | POA: Diagnosis not present

## 2019-07-15 NOTE — Progress Notes (Addendum)
Name: Summer [name]   MRN: [id number]    DOB: 1951-04-21   Date:07/15/2019       Progress Note  Subjective  Chief Complaint  Chief Complaint  Patient presents with  . Medication Management    HPI  Morbid Obesity: [name] has been very frustrated with her weight and is feeling hopeful that her bariatric surgery may finally happen. She is morbidly obese and has a lot of obese related co-morbidities including DM, HTN, decrease in exercise tolerance, OA , OSA. She has major depression and explained that she needs to continue counseling and follow up with psychiatrist for close monitoring after surgery, since she uses food as coping mechanism for stress. She also has a long history of SOB and decrease in exercise tolerance. She has a follow up with pulmonologist but also advised to see cardiologist for pre-op clearance. She states her weight has been stable, trying to follow a diabetic diet . Diabetes is managed by Endocrinologist    Patient Active Problem List   Diagnosis Date Noted  . Major depressive disorder, recurrent episode, moderate ([place]) 04/04/2019  . Left arm weakness 04/03/2019  . (HFpEF) heart failure with preserved ejection fraction ([place]) 08/31/2017  . Moderate persistent asthma 08/31/2017  . Hyperlipidemia due to type 2 diabetes mellitus ([place]) 06/14/2017  . Dyslipidemia associated with type 2 diabetes mellitus ([place]) 08/18/2016  . Charcot foot due to diabetes mellitus ([place]) 02/22/2016  . Acquired abduction deformity of foot 07/12/2015  . Osteoarthritis of subtalar joint 07/12/2015  . Poorly controlled type 2 diabetes mellitus with neuropathy ([place]) 07/12/2015  . Arthritis of foot, degenerative 07/12/2015  . Carpal tunnel syndrome 04/17/2015  . Chronic constipation 04/17/2015  . Insomnia, persistent 04/17/2015  . Chronic kidney disease (CKD), stage III (moderate) 04/17/2015  . Decreased exercise tolerance 04/17/2015  . Diabetes mellitus with polyneuropathy ([place]) 04/17/2015  .  Gastro-esophageal reflux disease without esophagitis 04/17/2015  . Bursitis, trochanteric 04/17/2015  . Cephalalgia 04/17/2015  . Benign hypertension 04/17/2015  . Adult hypothyroidism 04/17/2015  . Hearing loss 04/17/2015  . Chronic recurrent major depressive disorder ([place]) 04/17/2015  . Neurogenic claudication 04/17/2015  . Morbid obesity ([place]) 04/17/2015  . Hypo-ovarianism 04/17/2015  . Perennial allergic rhinitis with seasonal variation 04/17/2015  . Acne erythematosa 04/17/2015  . Dyskinesia, tardive 04/17/2015  . Memory loss 04/17/2015  . Impingement syndrome of shoulder 04/17/2015  . Dermatitis, stasis 04/17/2015  . Obstructive sleep apnea 05/14/2014  . Dyspnea 05/06/2014  . Hyperlipidemia 02/06/2012  . LBP (low back pain) 09/16/2008    Past Surgical History:  Procedure Laterality Date  . ANKLE SURGERY Left approx Jan 2018  . CATARACT EXTRACTION  01/2011   right  . eye lid surgery  2013   bilateral  . FOOT SURGERY    . NECK SURGERY    . TUBAL LIGATION    . VAGINAL HYSTERECTOMY  1989    Family History  Problem Relation Age of Onset  . Heart attack Mother   . Aneurysm Mother   . Heart attack Maternal Grandfather   . Diabetes Maternal Grandfather     Social History   Socioeconomic History  . Marital status: Significant Other    Spouse name: Not on file  . Number of children: 1  . Years of education: Not on file  . Highest education level: High school graduate  Occupational History  . Occupation: retired  Scientific laboratory technician  . Financial resource strain: Not hard at all  . Food insecurity  Worry: Never true    Inability: Never true  . Transportation needs    Medical: No    Non-medical: No  Tobacco Use  . Smoking status: Never Smoker  . Smokeless tobacco: Never Used  Substance and Sexual Activity  . Alcohol use: No    Alcohol/week: 0.0 standard drinks    Frequency: Never  . Drug use: No  . Sexual activity: Yes    Partners: Male  Lifestyle  .  Physical activity    Days per week: 0 days    Minutes per session: 0 min  . Stress: Rather much  Relationships  . Social connections    Talks on phone: More than three times a week    Gets together: Once a week    Attends religious service: Never    Active member of club or organization: No    Attends meetings of clubs or organizations: Never    Relationship status: Living with partner  . Intimate partner violence    Fear of current or ex partner: No    Emotionally abused: No    Physically abused: No    Forced sexual activity: No  Other Topics Concern  . Not on file  Social History Narrative   Son lives in [place] (retired from [employer]) and her granddaughter (35yr old) [place].     Current Outpatient Medications:  .  ACCU-CHEK SOFTCLIX LANCETS lancets, , Disp: , Rfl:  .  acetaminophen (TYLENOL 8 HOUR) 650 MG CR tablet, Take 1 tablet (650 mg total) by mouth every 8 (eight) hours as needed for pain., Disp: 90 tablet, Rfl: 2 .  albuterol (PROVENTIL HFA) 108 (90 Base) MCG/ACT inhaler, Inhale 2 puffs into the lungs 2 (two) times daily as needed for shortness of breath. , Disp: , Rfl:  .  Alcohol Swabs (B-D SINGLE USE SWABS REGULAR) PADS, , Disp: , Rfl:  .  amLODipine (NORVASC) 5 MG tablet, Take 1 tablet (5 mg total) by mouth daily., Disp: 30 tablet, Rfl: 0 .  ASPIRIN LOW DOSE 81 MG EC tablet, TAKE 1 TABLET (81 MG TOTAL) BY MOUTH DAILY., Disp: 30 tablet, Rfl: 0 .  Blood Glucose Monitoring Suppl (ACCU-CHEK AVIVA PLUS) w/Device KIT, , Disp: , Rfl:  .  Cyanocobalamin (VITAMIN B12 PO), Take 1 tablet by mouth daily. , Disp: , Rfl:  .  DULoxetine (CYMBALTA) 30 MG capsule, Take 1 capsule (30 mg total) by mouth daily., Disp: 30 capsule, Rfl: 3 .  furosemide (LASIX) 20 MG tablet, Take 1 tablet by mouth daily., Disp: 90 tablet, Rfl: 0 .  Icosapent Ethyl (VASCEPA) 1 g CAPS, Take 2 capsules (2 g total) by mouth 2 (two) times daily., Disp: 360 capsule, Rfl: 1 .  insulin  NPH-regular Human (70-30) 100 UNIT/ML injection, Inject 100 Units into the skin 2 (two) times daily with a meal., Disp: , Rfl:  .  irbesartan-hydrochlorothiazide (AVALIDE) 300-12.5 MG tablet, Take 1 tablet by mouth daily., Disp: 90 tablet, Rfl: 1 .  levothyroxine (SYNTHROID) 25 MCG tablet, Take 1 tablet by mouth daily except, take 2 tablets by mouth on Sunday, Disp: 100 tablet, Rfl: 0 .  metFORMIN (GLUCOPHAGE-XR) 500 MG 24 hr tablet, Take 500 mg by mouth. 2 tablets daily (total of 1000 mg daily), Disp: , Rfl:  .  metoprolol tartrate (LOPRESSOR) 25 MG tablet, Take 1 tablet (25 mg total) by mouth 2 (two) times daily., Disp: 180 tablet, Rfl: 1 .  Misc Natural Products (OSTEO BI-FLEX ADV JOINT  SHIELD PO), Take by mouth 2 (two) times a week., Disp: , Rfl:  .  montelukast (SINGULAIR) 10 MG tablet, Take 1 tablet (10 mg total) by mouth at bedtime., Disp: 90 tablet, Rfl: 1 .  Omega-3 Fatty Acids (FISH OIL) 1000 MG CAPS, Take 1,000 mg by mouth daily. , Disp: , Rfl:  .  omeprazole (PRILOSEC) 40 MG capsule, Take 1 capsule (40 mg total) by mouth daily., Disp: 90 capsule, Rfl: 1 .  Potassium 99 MG TABS, Take by mouth., Disp: , Rfl:  .  pregabalin (LYRICA) 300 MG capsule, Take 300 mg by mouth 2 (two) times a day., Disp: , Rfl:  .  Probiotic Product (PROBIOTIC-10 PO), Take by mouth., Disp: , Rfl:  .  RELION INSULIN SYRINGE 1ML/31G 31G X 5/16" 1 ML MISC, , Disp: , Rfl:  .  rosuvastatin (CRESTOR) 20 MG tablet, Take 1 tablet (20 mg total) by mouth daily., Disp: 90 tablet, Rfl: 1 .  sertraline (ZOLOFT) 25 MG tablet, Take 25 mg by mouth daily., Disp: , Rfl:  .  traZODone (DESYREL) 50 MG tablet, Take 1 tablet (50 mg total) by mouth at bedtime as needed for sleep., Disp: 90 tablet, Rfl: 1 .  vitamin C (ASCORBIC ACID) 500 MG tablet, Take 500 mg by mouth 2 (two) times daily. , Disp: , Rfl:  .  VITAMIN D, CHOLECALCIFEROL, PO, Take 1,000 Units by mouth daily. , Disp: , Rfl:   Allergies  Allergen Reactions  . Codeine Other  (See Comments)    "TRIPPED OUT"  DIDN'T LIKE THE [place]  . Atorvastatin     muscle pain  . Hydrocodone     itching  . Latex Rash  . Zolpidem Other (See Comments)    Sleep walk    I personally reviewed active problem list, medication list, allergies, family history, social history, health maintenance with the patient/caregiver today.   ROS  Ten systems reviewed and is negative except as mentioned in HPI   Objective  Vitals at home  Vitals:   07/15/19 1246  BP: 117/67  Pulse: 73  SpO2: 99%    There is no height or weight on file to calculate BMI.  Physical Exam  Awake, alert and oriented   Recent Results (from the past 2160 hour(s))  TSH     Status: None   Collection Time: 05/07/19 12:00 AM  Result Value Ref Range   TSH 1.10 0.40 - 4.50 mIU/L  HM DIABETES EYE EXAM     Status: None   Collection Time: 06/25/19 12:00 AM  Result Value Ref Range   HM Diabetic Eye Exam No Retinopathy No Retinopathy    Comment: [facility], Dr. Jenetta Downer' [place]     PHQ2/9: Depression screen [facility] 2/9 05/07/2019 04/14/2019 12/16/2018 12/02/2018 10/16/2018  Decreased Interest '3 3 2 1 3  '$ Down, Depressed, Hopeless '2 2 2 3 3  '$ PHQ - 2 Score '5 5 4 4 6  '$ Altered sleeping '2 3 3 3 3  '$ Tired, decreased energy '2 3 2 3 3  '$ Change in appetite '2 3 3 3 3  '$ Feeling bad or failure about yourself  '2 2 2 2 2  '$ Trouble concentrating '2 2 2 3 3  '$ Moving slowly or fidgety/restless 1 2 0 0 2  Suicidal thoughts '2 2 1 3 3  '$ PHQ-9 Score '18 22 17 21 25  '$ Difficult doing work/chores Somewhat difficult Very difficult Very difficult Very difficult Very difficult  Some recent data might be hidden  phq 9 is positive   Fall Risk: Fall Risk  05/07/2019 04/14/2019 03/20/2019 02/20/2019 01/22/2019  Falls in the past year? 0 0 0 0 0  Comment - - - - -  Number falls in past yr: 0 0 - - -  Injury with Fall? 0 0 - - -  Risk Factor Category  - - - - -  Comment - - - - -  Risk for fall due to : - - - - -  Risk for fall due  to: Comment - - - - -  Follow up - - - - -     Assessment & Plan  1. Morbid (severe) obesity due to excess calories [facility])  She is getting ready to have bariatric surgery, she needs a letter stating that I will follow up after the surgery to adjust her medications. Advised to see pulmonologist and cardiologist for pre-op. Also recommended visit with psychiatrist to discuss long term management and coping strategies after surgery. We will place referral to cardiologist at this time. Keep follow up with Dr. Hassell Done - surgeon. Advised to continue portion control, diabetic diet and all medications up to the night prior to surgery and depending on time of surgery and anesthesiologist recommendation.    2. SOB (shortness of breath)  - Ambulatory referral to Cardiology  3. Pre-op exam  - Ambulatory referral to Cardiology

## 2019-07-16 ENCOUNTER — Ambulatory Visit (INDEPENDENT_AMBULATORY_CARE_PROVIDER_SITE_OTHER): Payer: Medicare HMO | Admitting: Internal Medicine

## 2019-07-16 ENCOUNTER — Encounter: Payer: Self-pay | Admitting: Internal Medicine

## 2019-07-16 ENCOUNTER — Other Ambulatory Visit: Payer: Self-pay

## 2019-07-16 VITALS — BP 100/52 | HR 78 | Ht 62.0 in | Wt 257.8 lb

## 2019-07-16 DIAGNOSIS — Z01810 Encounter for preprocedural cardiovascular examination: Secondary | ICD-10-CM | POA: Diagnosis not present

## 2019-07-16 DIAGNOSIS — R0600 Dyspnea, unspecified: Secondary | ICD-10-CM | POA: Diagnosis not present

## 2019-07-16 DIAGNOSIS — R0609 Other forms of dyspnea: Secondary | ICD-10-CM

## 2019-07-16 NOTE — Patient Instructions (Signed)
Medication Instructions:  Your physician recommends that you continue on your current medications as directed. Please refer to the Current Medication list given to you today.  *If you need a refill on your cardiac medications before your next appointment, please call your pharmacy*  Lab Work: none If you have labs (blood work) drawn today and your tests are completely normal, you will receive your results only by: Marland Kitchen MyChart Message (if you have MyChart) OR . A paper copy in the mail If you have any lab test that is abnormal or we need to change your treatment, we will call you to review the results.  Testing/Procedures Dr End is ordered a Pharmacological Myocardial PET/CT at [facility].   Someone will call you to schedule and give you further information and date and time of exam.   Follow-Up: At [facility], you and your health needs are our priority.  As part of our continuing mission to provide you with exceptional heart care, we have created designated Provider Care Teams.  These Care Teams include your primary Cardiologist (physician) and Advanced Practice Providers (APPs -  Physician Assistants and Nurse Practitioners) who all work together to provide you with the care you need, when you need it.  Your next appointment:   1 month.  The format for your next appointment:   In Person  Provider:    You may see DR Harrell Gave END or one of the following Advanced Practice Providers on your designated Care Team:    Murray Hodgkins, NP  Christell Faith, PA-C  Marrianne Mood, PA-C

## 2019-07-16 NOTE — Progress Notes (Signed)
New Outpatient Visit Date: 07/16/2019  Referring Provider: Steele Sizer, [address],  [place] [address]  Chief Complaint: Shortness of breath  HPI:  Summer [name] is a 68 y.o. female who is being seen today for the evaluation of shortness of breath and preoperative cardiovascular risk assessment in anticipation of bariatric surgery at the request of Steele Sizer, MD. She has a history of HFpEF, hyperlipidemia, type 2 diabetes mellitus, asthma, obstructive sleep apnea, and morbid obesity.  She had a virtual visit with Dr. Ancil Boozer yesterday and reported declining exercise tolerance and shortness of breath.  She hopes to undergo bariatric surgery soon and was advised to see a cardiologist for preoperative evaluation.  Today, Summer [name] reports feeling well.  She has longstanding exertional dyspnea with minimal activity, which she attributes to her obestiy.  It has been stable for at least 3 years.  She denies orthopnea and PND.  She cannot tolerate CPAP.  She has chronic leg edema, which has been stable.  She denies chest pain, palpitations, and lightheadedness.  She would like to undergo bariatric surgery as soon as possible (Dr. Hassell Done at [facility]).  --------------------------------------------------------------------------------------------------  Cardiovascular History & Procedures: Cardiovascular Problems:  Shortness of breath  Risk Factors:  Hyperlipidemia, diabetes mellitus, morbid obesity, and age greater than 52  Cath/PCI:  None  CV Surgery:  None  EP Procedures and Devices:  None  Non-Invasive Evaluation(s):  TTE (04/03/2019): Normal LV size with LVEF greater than 65%.  Mild LVH.  Normal diastolic function.  Normal RV size and function.  Mitral annular calcification and valve thickening with trivial regurgitation.  TTE (07/04/2017): Normal LV size with LVEF 70%.  Grade 1 diastolic dysfunction.  Mild right atrial enlargement.   Normal RV size and function.  No significant valvular abnormality.  Pharmacologic MPI (02/19/2012): Probably normal study without significant ischemia or scar.  Study limited by GI uptake.  Recent CV Pertinent Labs: Lab Results  Component Value Date   CHOL 152 04/02/2019   CHOL 254 (H) 01/11/2016   HDL 32 (L) 04/02/2019   HDL 35 (L) 01/11/2016   LDLCALC 80 04/02/2019   [place]  05/01/2018     Comment:     . LDL cholesterol not calculated. Triglyceride levels greater than 400 mg/dL invalidate calculated LDL results. . Reference range: <100 . Desirable range <100 mg/dL for primary prevention;   <70 mg/dL for patients with CHD or diabetic patients  with > or = 2 CHD risk factors. Marland Kitchen LDL-C is now calculated using the Martin-Hopkins  calculation, which is a validated novel method providing  better accuracy than the Friedewald equation in the  estimation of LDL-C.  Cresenciano Genre et al. Annamaria Helling. 6045;409(81): 2061-2068  (http://education.[facility].com/faq/FAQ164)    TRIG 201 (H) 04/02/2019   CHOLHDL 4.8 04/02/2019   INR 1.0 04/02/2019   BNP 20 10/02/2017   K 4.2 04/02/2019   BUN 45 (H) 04/02/2019   BUN 42 (H) 01/11/2016   CREATININE 1.35 (H) 04/02/2019   CREATININE 1.47 (H) 05/01/2018    --------------------------------------------------------------------------------------------------  Past Medical History:  Diagnosis Date  . Anemia   . Arthritis   . Asthma   . Carpal tunnel syndrome   . Chronic kidney disease   . Depressive disorder   . Diabetes mellitus   . Dyspnea   . GERD (gastroesophageal reflux disease)   . Headache   . History of hiatal hernia   . Hyperlipidemia   . Hypertension   . Insomnia   .  Lumbago   . Memory loss   . Obesity   . 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)   . Pneumonia   . Rhinitis, allergic   . Rosacea   . Thyroid disease   . Unspecified hearing loss   . Unspecified hereditary and idiopathic peripheral neuropathy   . Unspecified sleep  apnea     Past Surgical History:  Procedure Laterality Date  . ANKLE SURGERY Left approx Jan 2018  . CATARACT EXTRACTION  01/2011   right  . eye lid surgery  2013   bilateral  . FOOT SURGERY    . NECK SURGERY    . TUBAL LIGATION    . VAGINAL HYSTERECTOMY  1989    Current Meds  Medication Sig  . ACCU-CHEK SOFTCLIX LANCETS lancets   . acetaminophen (TYLENOL 8 HOUR) 650 MG CR tablet Take 1 tablet (650 mg total) by mouth every 8 (eight) hours as needed for pain.  Marland Kitchen albuterol (PROVENTIL HFA) 108 (90 Base) MCG/ACT inhaler Inhale 2 puffs into the lungs 2 (two) times daily as needed for shortness of breath.   . Alcohol Swabs (B-D SINGLE USE SWABS REGULAR) PADS   . amLODipine (NORVASC) 5 MG tablet Take 1 tablet (5 mg total) by mouth daily.  . ASPIRIN LOW DOSE 81 MG EC tablet TAKE 1 TABLET (81 MG TOTAL) BY MOUTH DAILY.  Marland Kitchen Blood Glucose Monitoring Suppl (ACCU-CHEK AVIVA PLUS) w/Device KIT   . Cyanocobalamin (VITAMIN B12 PO) Take 1 tablet by mouth daily.   . DULoxetine (CYMBALTA) 30 MG capsule Take 1 capsule (30 mg total) by mouth daily.  . furosemide (LASIX) 20 MG tablet Take 1 tablet by mouth daily.  Vanessa Kick Ethyl (VASCEPA) 1 g CAPS Take 2 capsules (2 g total) by mouth 2 (two) times daily.  . insulin NPH-regular Human (70-30) 100 UNIT/ML injection Inject 100 Units into the skin 2 (two) times daily with a meal.  . irbesartan-hydrochlorothiazide (AVALIDE) 300-12.5 MG tablet Take 1 tablet by mouth daily.  Marland Kitchen levothyroxine (SYNTHROID) 25 MCG tablet Take 1 tablet by mouth daily except, take 2 tablets by mouth on Sunday  . metFORMIN (GLUCOPHAGE-XR) 500 MG 24 hr tablet Take 500 mg by mouth. 2 tablets daily (total of 1000 mg daily)  . metoprolol tartrate (LOPRESSOR) 25 MG tablet Take 1 tablet (25 mg total) by mouth 2 (two) times daily.  . Misc Natural Products (OSTEO BI-FLEX ADV JOINT SHIELD PO) Take by mouth 2 (two) times a week.  . montelukast (SINGULAIR) 10 MG tablet Take 1 tablet (10 mg total)  by mouth at bedtime.  . Omega-3 Fatty Acids (FISH OIL) 1000 MG CAPS Take 1,000 mg by mouth daily.   Marland Kitchen omeprazole (PRILOSEC) 40 MG capsule Take 1 capsule (40 mg total) by mouth daily.  . Potassium 99 MG TABS Take by mouth.  . pregabalin (LYRICA) 300 MG capsule Take 300 mg by mouth 2 (two) times a day.  . Probiotic Product (PROBIOTIC-10 PO) Take by mouth.  Daryll Brod INSULIN SYRINGE 1ML/31G 31G X 5/16" 1 ML MISC   . rosuvastatin (CRESTOR) 20 MG tablet Take 1 tablet (20 mg total) by mouth daily.  . sertraline (ZOLOFT) 25 MG tablet Take 25 mg by mouth daily.  . traZODone (DESYREL) 50 MG tablet Take 1 tablet (50 mg total) by mouth at bedtime as needed for sleep.  . vitamin C (ASCORBIC ACID) 500 MG tablet Take 500 mg by mouth 2 (two) times daily.   Marland Kitchen VITAMIN D, CHOLECALCIFEROL, PO Take 1,000 Units by  mouth daily.     Allergies: Codeine, Atorvastatin, Hydrocodone, Latex, and Zolpidem  Social History   Tobacco Use  . Smoking status: Never Smoker  . Smokeless tobacco: Never Used  Substance Use Topics  . Alcohol use: No    Alcohol/week: 0.0 standard drinks    Frequency: Never  . Drug use: No    Family History  Problem Relation Age of Onset  . Aneurysm Mother   . Aortic aneurysm Mother   . Heart attack Maternal Grandfather   . Diabetes Maternal Grandfather     Review of Systems: A 12-system review of systems was performed and was negative except as noted in the HPI.  --------------------------------------------------------------------------------------------------  Physical Exam: BP (!) 100/52 (BP Location: Right Arm, Patient Position: Sitting, Cuff Size: Large)   Pulse 78   Ht '5\' 2"'$  (1.575 m)   Wt 257 lb 12 oz (116.9 kg)   SpO2 98%   BMI 47.14 kg/m   General:  NAD. HEENT: No conjunctival pallor or scleral icterus. Facemask in place. Neck: Supple without lymphadenopathy, thyromegaly, JVD, or HJR, though evaluation is limited by body habitus.. No carotid bruit. Lungs: Normal  work of breathing. Clear to auscultation bilaterally without wheezes or crackles. Heart: Distant heart sounds.  Regular rate and rhythm without murmurs, rubs, or gallops. Unable to assess PMI due to body habitus.. Abd: Bowel sounds present. Soft, NT/ND.  Unable to assess HSM due to body habitus. Ext: No lower extremity edema. Radial, PT, and DP pulses are 2+ bilaterally Skin: Warm and dry without rash. Neuro: CNIII-XII intact. Strength and fine-touch sensation intact in upper and lower extremities bilaterally. Psych: Normal mood and affect.  EKG:  NSR with poor R wave progression in V1-2, which may reflect lead placement or prior septal MI.  Lab Results  Component Value Date   WBC 9.2 04/02/2019   HGB 11.8 (L) 04/02/2019   HCT 35.3 (L) 04/02/2019   MCV 87.6 04/02/2019   PLT 254 04/02/2019    Lab Results  Component Value Date   NA 141 04/02/2019   K 4.2 04/02/2019   CL 103 04/02/2019   CO2 24 04/02/2019   BUN 45 (H) 04/02/2019   CREATININE 1.35 (H) 04/02/2019   GLUCOSE 109 (H) 04/02/2019   ALT 32 04/02/2019    Lab Results  Component Value Date   CHOL 152 04/02/2019   HDL 32 (L) 04/02/2019   LDLCALC 80 04/02/2019   TRIG 201 (H) 04/02/2019   CHOLHDL 4.8 04/02/2019     --------------------------------------------------------------------------------------------------  ASSESSMENT AND PLAN: Dyspnea on exertion: This has been longstanding (present at least 3 years) and is likely driven by morbid obesity, deconditioning, asthma, and untreated OSA.  Echo earlier this year showed preserved LVEF with normal diastolic parameters.  Risk factors for CAD include hyperlipidemia, diabetes, obesity, and age.  EKG shows septal Q-waves, which may reflect lead placement but prior septal MI cannot be excluded.  I have recommended a pharmacologic myocardial PET/CT at [facility], given the patient's BMI.  No medication changes are planned today.  Morbid obesity: I have recommended weight loss through  a combination of diet and exercise.  Exploring further bariatric surgery options is reasonable.  Preop evaluation: Summer [name] does not have any unstable symptoms but reports several years of dyspnea with minimal exertion.  She is unable to perform 4 METS of activity.  I have recommended a pharmacologic myocardial PET/CT stress test for risk stratification in anticipation of possible bariatric surgery.  Follow-up: Return to clinic in 1  month; if stress test is low risk/normal, this could be deferred.  [name], MD 07/17/2019 7:05 PM

## 2019-07-17 ENCOUNTER — Telehealth: Payer: Self-pay | Admitting: *Deleted

## 2019-07-17 NOTE — Telephone Encounter (Signed)
Dr End ordered for patient at [place] 07/16/2019 to have Pharmacological Myocardial PET/CT at [facility] for shortness of breath and preoperative cardiovascular examination.  Order filled out and signed by Dr End. CPT code [phone], ICD 10 R06.00 and Z01.810.  Insurance Josem Kaufmann [id number] at [place]. 08-16-2019. Faxed to [phone] successfully.

## 2019-07-18 ENCOUNTER — Encounter: Payer: Self-pay | Admitting: Internal Medicine

## 2019-07-18 ENCOUNTER — Telehealth: Payer: Self-pay | Admitting: Internal Medicine

## 2019-07-18 DIAGNOSIS — Z01810 Encounter for preprocedural cardiovascular examination: Secondary | ICD-10-CM | POA: Insufficient documentation

## 2019-07-18 NOTE — Telephone Encounter (Signed)
Please call regarding authorization for PET scan.

## 2019-07-18 NOTE — Telephone Encounter (Signed)
[facility] radiology and was told patient is already scheduled. They will let Tammy know I called but they think everything is taken care of at this time.

## 2019-07-23 DIAGNOSIS — I129 Hypertensive chronic kidney disease with stage 1 through stage 4 chronic kidney disease, or unspecified chronic kidney disease: Secondary | ICD-10-CM | POA: Diagnosis not present

## 2019-07-23 DIAGNOSIS — Z01810 Encounter for preprocedural cardiovascular examination: Secondary | ICD-10-CM | POA: Diagnosis not present

## 2019-07-23 DIAGNOSIS — E785 Hyperlipidemia, unspecified: Secondary | ICD-10-CM | POA: Diagnosis not present

## 2019-07-23 DIAGNOSIS — E1122 Type 2 diabetes mellitus with diabetic chronic kidney disease: Secondary | ICD-10-CM | POA: Diagnosis not present

## 2019-07-23 DIAGNOSIS — R0600 Dyspnea, unspecified: Secondary | ICD-10-CM | POA: Diagnosis not present

## 2019-07-23 DIAGNOSIS — Z01818 Encounter for other preprocedural examination: Secondary | ICD-10-CM | POA: Diagnosis not present

## 2019-07-23 DIAGNOSIS — N189 Chronic kidney disease, unspecified: Secondary | ICD-10-CM | POA: Diagnosis not present

## 2019-07-24 NOTE — Telephone Encounter (Signed)
Test completed on 07/1119. Results in [place]. Routing to Dr End.

## 2019-07-24 NOTE — Telephone Encounter (Signed)
Patient notified and verbalized understanding of results and plan of care.

## 2019-07-24 NOTE — Telephone Encounter (Signed)
Myocardial PET/CT results reviewed.  Study was normal without ischemia or scar.  Mild coronary artery calcification was noted.  I do not recommend any further cardiac testing at this time.  Summer [name] can proceed with bariatric surgery from a cardiac standpoint.  Summer [name] should continue to work on blood pressure and cholesterol control with her PCP.  She can follow-up with [place] on an as-needed based.  [name], MD [facility] Pager: [phone]

## 2019-07-28 ENCOUNTER — Ambulatory Visit: Payer: Medicare HMO | Admitting: Psychology

## 2019-07-31 ENCOUNTER — Ambulatory Visit (INDEPENDENT_AMBULATORY_CARE_PROVIDER_SITE_OTHER): Payer: Medicare HMO

## 2019-07-31 VITALS — BP 124/65 | HR 79 | Temp 98.1°F | Resp 16 | Ht 62.0 in | Wt 253.0 lb

## 2019-07-31 DIAGNOSIS — Z0000 Encounter for general adult medical examination without abnormal findings: Secondary | ICD-10-CM | POA: Diagnosis not present

## 2019-07-31 DIAGNOSIS — Z1211 Encounter for screening for malignant neoplasm of colon: Secondary | ICD-10-CM | POA: Diagnosis not present

## 2019-07-31 DIAGNOSIS — Z780 Asymptomatic menopausal state: Secondary | ICD-10-CM

## 2019-07-31 NOTE — Progress Notes (Signed)
Subjective:   Summer [name] is a 68 y.o. female who presents for Medicare Annual (Subsequent) preventive examination.  Virtual Visit via Telephone Note  I connected with Summer [name] on 07/31/19 at  9:20 AM EST by telephone and verified that I am speaking with the correct person using two identifiers.  Medicare Annual Wellness visit completed telephonically due to Covid-19 pandemic.   Location: Patient: home Provider: office   I discussed the limitations, risks, security and privacy concerns of performing an evaluation and management service by telephone and the availability of in person appointments. The patient expressed understanding and agreed to proceed.  Some vital signs may be absent or patient reported.   Clemetine Marker, LPN    Review of Systems:   Cardiac Risk Factors include: advanced age (>21mn, >>73women);dyslipidemia;hypertension;obesity (BMI >30kg/m2);diabetes mellitus     Objective:     Vitals: BP 124/65   Pulse 79   Temp 98.1 F (36.7 C)   Resp 16   Ht _0  (1.575 m)   Wt 253 lb (114.8 kg)   SpO2 96%   BMI 46.27 kg/m   Body mass index is 46.27 kg/m.  Advanced Directives 07/31/2019 04/07/2019 04/02/2019 08/11/2018 07/26/2018 05/28/2018 02/07/2018  Does Patient Have a Medical Advance Directive? Yes Yes No Yes Yes No Yes  Type of AParamedicof [place]outh [place] will Living will - Living will;Healthcare Power of [place] - [place]  Does patient want to make changes to medical advance directive? - No - Patient declined - - - - -  Copy of [place] Chart? Yes - validated most recent copy scanned in chart (See row information) - - - Yes - validated most recent copy scanned in chart (See row information) - Yes  Would patient like information on creating a medical advance directive? - - No - Patient declined - - No - Patient declined -  Some  encounter information is confidential and restricted. Go to Review Flowsheets activity to see all data.    Tobacco Social History   Tobacco Use  Smoking Status Never Smoker  Smokeless Tobacco Never Used     Counseling given: Not Answered   Clinical Intake:  Pre-visit preparation completed: Yes  Pain : No/denies pain     BMI - recorded: 46.27 Nutritional Status: BMI > 30  Obese Nutritional Risks: None Diabetes: Yes CBG done?: No Did pt. bring in CBG monitor from home?: No   Nutrition Risk Assessment:  Has the patient had any N/V/D within the last 2 months?  No  Does the patient have any non-healing wounds?  No  Has the patient had any unintentional weight loss or weight gain?  No   Diabetes:  Is the patient diabetic?  Yes  If diabetic, was a CBG obtained today?  No  Did the patient bring in their glucometer from home?  No  How often do you monitor your CBG's? daily.   Financial Strains and Diabetes Management:  Are you having any financial strains with the device, your supplies or your medication? No .  Does the patient want to be seen by Chronic Care Management for management of their diabetes?  No  Would the patient like to be referred to a Nutritionist or for Diabetic Management?  No   Diabetic Exams:  Diabetic Eye Exam: Completed 06/25/19 negative retinopathy.   Diabetic Foot Exam: Completed 12/19/18.   How often do you need  to have someone help you when you read instructions, pamphlets, or other written materials from your doctor or pharmacy?: 1 - Never  Interpreter Needed?: No  Information entered by :: Clemetine Marker LPN  Past Medical History:  Diagnosis Date  . Anemia   . Arthritis   . Asthma   . Carpal tunnel syndrome   . Chronic kidney disease   . Depressive disorder   . Diabetes mellitus   . Dyspnea   . GERD (gastroesophageal reflux disease)   . Headache   . History of hiatal hernia   . Hyperlipidemia   . Hypertension   . Insomnia   .  Lumbago   . Memory loss   . Obesity   . 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)   . Pneumonia   . Rhinitis, allergic   . Rosacea   . Thyroid disease   . Unspecified [name] loss   . Unspecified hereditary and idiopathic peripheral neuropathy   . Unspecified sleep apnea    Past Surgical History:  Procedure Laterality Date  . ANKLE SURGERY Left approx Jan 2018  . CATARACT EXTRACTION  01/2011   right  . eye lid surgery  2013   bilateral  . FOOT SURGERY    . NECK SURGERY    . TUBAL LIGATION    . VAGINAL HYSTERECTOMY  1989   Family History  Problem Relation Age of Onset  . Aneurysm Mother   . Aortic aneurysm Mother   . Heart attack Maternal Grandfather   . Diabetes Maternal Grandfather    Social History   Socioeconomic History  . Marital status: Significant Other    Spouse name: Not on file  . Number of children: 1  . Years of education: Not on file  . Highest education level: High school graduate  Occupational History  . Occupation: retired  Scientific laboratory technician  . Financial resource strain: Not hard at all  . Food insecurity    Worry: Never true    Inability: Never true  . Transportation needs    Medical: No    Non-medical: No  Tobacco Use  . Smoking status: Never Smoker  . Smokeless tobacco: Never Used  Substance and Sexual Activity  . Alcohol use: No    Alcohol/week: 0.0 standard drinks    Frequency: Never  . Drug use: No  . Sexual activity: Yes    Partners: Male  Lifestyle  . Physical activity    Days per week: 0 days    Minutes per session: 0 min  . Stress: To some extent  Relationships  . Social connections    Talks on phone: More than three times a week    Gets together: Once a week    Attends religious service: Never    Active member of club or organization: No    Attends meetings of clubs or organizations: Never    Relationship status: Living with partner  Other Topics Concern  . Not on file  Social History Narrative   Son lives in [place] (retired from [employer]) and her granddaughter (26yr old[place]    Outpatient Encounter Medications as of 07/31/2019  Medication Sig  . ACCU-CHEK SOFTCLIX LANCETS lancets   . acetaminophen (TYLENOL 8 HOUR) 650 MG CR tablet Take 1 tablet (650 mg total) by mouth every 8 (eight) hours as needed for pain.  .Marland Kitchenalbuterol (PROVENTIL HFA) 108 (90 Base) MCG/ACT inhaler Inhale 2 puffs into the lungs 2 (two) times daily as needed for shortness  of breath.   . Alcohol Swabs (B-D SINGLE USE SWABS REGULAR) PADS   . amLODipine (NORVASC) 5 MG tablet Take 1 tablet (5 mg total) by mouth daily.  . ASPIRIN LOW DOSE 81 MG EC tablet TAKE 1 TABLET (81 MG TOTAL) BY MOUTH DAILY.  Marland Kitchen Blood Glucose Monitoring Suppl (ACCU-CHEK AVIVA PLUS) w/Device KIT   . Cyanocobalamin (VITAMIN B12 PO) Take 1 tablet by mouth daily.   . DULoxetine (CYMBALTA) 30 MG capsule Take 1 capsule (30 mg total) by mouth daily.  . furosemide (LASIX) 20 MG tablet Take 1 tablet by mouth daily.  Vanessa Kick Ethyl (VASCEPA) 1 g CAPS Take 2 capsules (2 g total) by mouth 2 (two) times daily.  . insulin NPH-regular Human (70-30) 100 UNIT/ML injection Inject 100 Units into the skin 2 (two) times daily with a meal.  . irbesartan-hydrochlorothiazide (AVALIDE) 300-12.5 MG tablet Take 1 tablet by mouth daily.  Marland Kitchen levothyroxine (SYNTHROID) 25 MCG tablet Take 1 tablet by mouth daily except, take 2 tablets by mouth on Sunday  . metFORMIN (GLUCOPHAGE-XR) 500 MG 24 hr tablet Take 500 mg by mouth. 2 tablets daily (total of 1000 mg daily)  . metoprolol tartrate (LOPRESSOR) 25 MG tablet Take 1 tablet (25 mg total) by mouth 2 (two) times daily.  . Misc Natural Products (OSTEO BI-FLEX ADV JOINT SHIELD PO) Take by mouth 2 (two) times daily.   . montelukast (SINGULAIR) 10 MG tablet Take 1 tablet (10 mg total) by mouth at bedtime.  . Omega-3 Fatty Acids (FISH OIL) 1000 MG CAPS Take 2,000 mg by mouth daily.   Marland Kitchen omeprazole (PRILOSEC) 40 MG capsule Take 1 capsule (40  mg total) by mouth daily.  . Potassium 99 MG TABS Take by mouth.  . pregabalin (LYRICA) 300 MG capsule Take 300 mg by mouth 2 (two) times a day.  . Probiotic Product (PROBIOTIC-10 PO) Take by mouth.  Daryll Brod INSULIN SYRINGE 1ML/31G 31G X 5/16" 1 ML MISC   . rosuvastatin (CRESTOR) 20 MG tablet Take 1 tablet (20 mg total) by mouth daily.  . sertraline (ZOLOFT) 50 MG tablet Take 50 mg by mouth daily.  . traZODone (DESYREL) 50 MG tablet Take 1 tablet (50 mg total) by mouth at bedtime as needed for sleep.  . vitamin C (ASCORBIC ACID) 500 MG tablet Take 500 mg by mouth 2 (two) times daily.   Marland Kitchen VITAMIN D, CHOLECALCIFEROL, PO Take 1,000 Units by mouth daily.   . [DISCONTINUED] sertraline (ZOLOFT) 25 MG tablet Take 25 mg by mouth daily.   No facility-administered encounter medications on file as of 07/31/2019.     Activities of Daily Living In your present state of health, do you have any difficulty performing the following activities: 07/31/2019 07/15/2019  [name]? N N  Comment declines [name] aids -  Vision? N N  Comment - -  Difficulty concentrating or making decisions? N Y  Walking or climbing stairs? Y Y  Dressing or bathing? N N  Doing errands, shopping? N N  Preparing Food and eating ? N -  Using the Toilet? N -  In the past six months, have you accidently leaked urine? N -  Do you have problems with loss of bowel control? N -  Managing your Medications? N -  Managing your Finances? N -  Housekeeping or managing your Housekeeping? N -  Some recent data might be hidden    Patient Care Team: Steele Sizer, MD as PCP - General (Family Medicine) Gardiner Barefoot, DPM as  Consulting Physician (Podiatry) Laverle Hobby, MD as Consulting Physician (Pulmonary Disease) McDonald, Gwen Her, MD as Consulting Physician (Specialist) Cathi Roan, [facility] as Pharmacist (Pharmacist) Benedetto Goad, RN as Case Manager    Assessment:   This is a routine wellness examination for  Summer [name].  Exercise Activities and Dietary recommendations Current Exercise Habits: The patient does not participate in regular exercise at present, Exercise limited by: orthopedic condition(s);cardiac condition(s);respiratory conditions(s)  Goals    . "I need help paying for this new medication" (pt-stated)     Clinical Goal(s): Over the next 10 days, Summer [name] will provide the necessary supplementary documents (proof of out of pocket prescription expenditure, proof of household income) needed for medication assistance applications to CCM pharmacist.   Interventions:  CCM pharmacist will apply for assistance for Vascepa made by [employer].   CCM pharmacist will collaborate with primary care physician to complete a tier exception request to lower the tier for Vascepa on patient's formulary, helping to lower copay cost.      . My financial stress is affecting my mental health (pt-stated)     Current Barriers:  Marland Kitchen Knowledge Deficits related to available resources for financial counseling  . Film/video editor.   Nurse Case Manager Clinical Goal(s):  Marland Kitchen Over the next 14 days, patient will report utilization of provided resources to explore options for financial counseling- goal unmet . Over the next 14 days, patient will report taking all medications including antidepressant as prescribed-goal met 05/02/2019 . Over the next 14 days, patient will report receiving appointment with mental health provider-in process  Interventions:  . Assessed for utilization of previously provided financial counseling resources . Assessed for follow through of contacting [employer] to find an in-network psychiatrist . Provided emotional support and reassurance  Patient Self Care Activities:  . Will request referral to in-network psychiatry provided from pcp . Calls provider office for new concerns or questions  Please see past updates related to this goal by clicking on the "Past Updates"  button in the selected goal      . Weight (lb) < 200 lb (90.7 kg)     Pt would like to lose weight to feel better. Planning for gastric bypass surgery.       Fall Risk Fall Risk  07/31/2019 05/07/2019 04/14/2019 03/20/2019 02/20/2019  Falls in the past year? 0 0 0 0 0  Comment - - - - -  Number falls in past yr: 0 0 0 - -  Injury with Fall? 0 0 0 - -  Risk Factor Category  - - - - -  Comment - - - - -  Risk for fall due to : - - - - -  Risk for fall due to: Comment - - - - -  Follow up Falls prevention discussed - - - -   FALL RISK PREVENTION PERTAINING TO THE HOME:  Any stairs in or around the home? No  If so, do they handrails? No   Home free of loose throw rugs in walkways, pet beds, electrical cords, etc? Yes  Adequate lighting in your home to reduce risk of falls? Yes   ASSISTIVE DEVICES UTILIZED TO PREVENT FALLS:  Life alert? No  Use of a cane, walker or w/c? Yes  Grab bars in the bathroom? Yes  Shower chair or bench in shower? Yes  Elevated toilet seat or a handicapped toilet? Yes   DME ORDERS:  DME order needed?  No   TIMED UP AND  GO:  Was the test performed? No . Telephonic visit  Education: Fall risk prevention has been discussed.  Intervention(s) required? No    Depression Screen PHQ 2/9 Scores 07/31/2019 07/15/2019 05/07/2019 04/14/2019  PHQ - 2 Score _0 PHQ- 9 Score _1 Cognitive Function     6CIT Screen 07/31/2019 07/26/2018 06/27/2017  What Year? 0 points 0 points 0 points  What month? 0 points 0 points 0 points  What time? 0 points 0 points 0 points  Count back from 20 0 points 0 points 0 points  Months in reverse 0 points 0 points 0 points  Repeat phrase 0 points 0 points 0 points  Total Score 0 0 0    Immunization History  Administered Date(s) Administered  . Fluad Quad(high Dose 65+) 06/03/2019  . Influenza Split 06/13/2013, 06/16/2014  . Influenza, High Dose Seasonal PF 05/24/2015, 06/27/2017, 05/27/2018  . Influenza,  Seasonal, Injecte, Preservative Fre 06/03/2010, 07/25/2011  . Influenza-Unspecified 07/21/2016  . Pneumococcal Conjugate-13 07/14/2013  . Pneumococcal Polysaccharide-23 10/01/2006, 06/16/2014  . Tdap 03/04/2010, 07/20/2015  . Zoster 08/12/2012    Qualifies for Shingles Vaccine? Yes  Zostavax completed 2013. Due for Shingrix. Education has been provided regarding the importance of this vaccine. Pt has been advised to call insurance company to determine out of pocket expense. Advised may also receive vaccine at local pharmacy or Health Dept. Verbalized acceptance and understanding.  Tdap: Up to date  Flu Vaccine: Up to date  Pneumococcal Vaccine: Up to date    Screening Tests Health Maintenance  Topic Date Due  . COLONOSCOPY  09/16/2019  . HEMOGLOBIN A1C  10/03/2019  . FOOT EXAM  12/19/2019  . OPHTHALMOLOGY EXAM  06/24/2020  . MAMMOGRAM  06/28/2020  . TETANUS/TDAP  07/19/2025  . INFLUENZA VACCINE  Completed  . DEXA SCAN  Completed  . Hepatitis C Screening  Completed  . PNA vac Low Risk Adult  Completed    Cancer Screenings:  Colorectal Screening: Completed 09/15/09. Repeat every 10 years. Referral to GI placed today. Pt aware the office will call re: appt.  Mammogram: Completed 06/28/18. Repeat every year; scheduled for 09/19/19  Bone Density: Completed 06/26/17. Results reflect NORMAL  Repeat every 2 years. Ordered today. Pt provided with contact information and advised to call to schedule appt.   Lung Cancer Screening: (Low Dose CT Chest recommended if Age 34-80 years, 30 pack-year currently smoking OR have quit w/in 15years.) does not qualify.    Additional Screening:  Hepatitis C Screening: does qualify; Completed 08/15/12  Vision Screening: Recommended annual ophthalmology exams for early detection of glaucoma and other disorders of the eye. Is the patient up to date with their annual eye exam?  Yes  Who is the provider or what is the name of the office in which the pt  attends annual eye exams? [place] Screening: Recommended annual dental exams for proper oral hygiene  Community Resource Referral:  CRR required this visit?  No      Plan:     I have personally reviewed and addressed the Medicare Annual Wellness questionnaire and have noted the following in the patient's chart:  A. Medical and social history B. Use of alcohol, tobacco or illicit drugs  C. Current medications and supplements D. Functional ability and status E.  Nutritional status F.  Physical activity G. Advance directives H. List of other physicians I.  Hospitalizations, surgeries, and ER visits in previous 42  months J.  Vitals K. Screenings such as [name] and vision if needed, cognitive and depression L. Referrals and appointments   In addition, I have reviewed and discussed with patient certain preventive protocols, quality metrics, and best practice recommendations. A written personalized care plan for preventive services as well as general preventive health recommendations were provided to patient.   Signed,  Clemetine Marker, LPN Nurse Health Advisor   Nurse Notes: PHQ9 score today of 18. Pt's zoloft recently increased. She is struggling with not yet receiving approval from bariatrics for gastric bypass surgery for the psychological aspect of it and states she is having a hard time finding a therapist willing to work with her for the need of surgery. She c/o not having any motivation and feels having surgery with help her get started with weight loss and improving overall health. Pt to follow up with Dr. Ancil Boozer on 09/02/19.

## 2019-07-31 NOTE — Patient Instructions (Signed)
Summer [name] , Thank you for taking time to come for your Medicare Wellness Visit. I appreciate your ongoing commitment to your health goals. Please review the following plan we discussed and let me know if I can assist you in the future.   Screening recommendations/referrals: Colonoscopy: done 09/15/09. Referral sent to GI today for repeat screening colonoscopy.  Mammogram: done 06/28/18. Scheduled for 09/19/19 Bone Density: done 06/26/17. Please call [phone] to schedule your bone density screening.  Recommended yearly ophthalmology/optometry visit for glaucoma screening and checkup Recommended yearly dental visit for hygiene and checkup  Vaccinations: Influenza vaccine: done 06/03/19 Pneumococcal vaccine: done 06/16/14 Tdap vaccine: done 07/20/15 Shingles vaccine: Shingrix discussed. Please contact your pharmacy for coverage information.   Conditions/risks identified: Recommend increasing physical activity as tolerated.   Next appointment: Please follow up in one year for your Medicare Annual Wellness visit.     Preventive Care 20 Years and Older, Female Preventive care refers to lifestyle choices and visits with your health care provider that can promote health and wellness. What does preventive care include?  A yearly physical exam. This is also called an annual well check.  Dental exams once or twice a year.  Routine eye exams. Ask your health care provider how often you should have your eyes checked.  Personal lifestyle choices, including:  Daily care of your teeth and gums.  Regular physical activity.  Eating a healthy diet.  Avoiding tobacco and drug use.  Limiting alcohol use.  Practicing safe sex.  Taking low-dose aspirin every day.  Taking vitamin and mineral supplements as recommended by your health care provider. What happens during an annual well check? The services and screenings done by your health care provider during your annual well check will depend on  your age, overall health, lifestyle risk factors, and family history of disease. Counseling  Your health care provider may ask you questions about your:  Alcohol use.  Tobacco use.  Drug use.  Emotional well-being.  Home and relationship well-being.  Sexual activity.  Eating habits.  History of falls.  Memory and ability to understand (cognition).  Work and work Statistician.  Reproductive health. Screening  You may have the following tests or measurements:  Height, weight, and BMI.  Blood pressure.  Lipid and cholesterol levels. These may be checked every 5 years, or more frequently if you are over 46 years old.  Skin check.  Lung cancer screening. You may have this screening every year starting at age 79 if you have a 30-pack-year history of smoking and currently smoke or have quit within the past 15 years.  Fecal occult blood test (FOBT) of the stool. You may have this test every year starting at age 64.  Flexible sigmoidoscopy or colonoscopy. You may have a sigmoidoscopy every 5 years or a colonoscopy every 10 years starting at age 47.  Hepatitis C blood test.  Hepatitis B blood test.  Sexually transmitted disease (STD) testing.  Diabetes screening. This is done by checking your blood sugar (glucose) after you have not eaten for a while (fasting). You may have this done every 1-3 years.  Bone density scan. This is done to screen for osteoporosis. You may have this done starting at age 71.  Mammogram. This may be done every 1-2 years. Talk to your health care provider about how often you should have regular mammograms. Talk with your health care provider about your test results, treatment options, and if necessary, the need for more tests. Vaccines  Your health  care provider may recommend certain vaccines, such as:  Influenza vaccine. This is recommended every year.  Tetanus, diphtheria, and acellular pertussis (Tdap, Td) vaccine. You may need a Td booster  every 10 years.  Zoster vaccine. You may need this after age 73.  Pneumococcal 13-valent conjugate (PCV13) vaccine. One dose is recommended after age 75.  Pneumococcal polysaccharide (PPSV23) vaccine. One dose is recommended after age 82. Talk to your health care provider about which screenings and vaccines you need and how often you need them. This information is not intended to replace advice given to you by your health care provider. Make sure you discuss any questions you have with your health care provider. Document Released: 09/24/2015 Document Revised: 05/17/2016 Document Reviewed: 06/29/2015 Elsevier Interactive Patient Education  2017 [place] Prevention in the Home Falls can cause injuries. They can happen to people of all ages. There are many things you can do to make your home safe and to help prevent falls. What can I do on the outside of my home?  Regularly fix the edges of walkways and driveways and fix any cracks.  Remove anything that might make you trip as you walk through a door, such as a raised step or threshold.  Trim any bushes or trees on the path to your home.  Use bright outdoor lighting.  Clear any walking paths of anything that might make someone trip, such as rocks or tools.  Regularly check to see if handrails are loose or broken. Make sure that both sides of any steps have handrails.  Any raised decks and porches should have guardrails on the edges.  Have any leaves, snow, or ice cleared regularly.  Use sand or salt on walking paths during winter.  Clean up any spills in your garage right away. This includes oil or grease spills. What can I do in the bathroom?  Use night lights.  Install grab bars by the toilet and in the tub and shower. Do not use towel bars as grab bars.  Use non-skid mats or decals in the tub or shower.  If you need to sit down in the shower, use a plastic, non-slip stool.  Keep the floor dry. Clean up any  water that spills on the floor as soon as it happens.  Remove soap buildup in the tub or shower regularly.  Attach bath mats securely with double-sided non-slip rug tape.  Do not have throw rugs and other things on the floor that can make you trip. What can I do in the bedroom?  Use night lights.  Make sure that you have a light by your bed that is easy to reach.  Do not use any sheets or blankets that are too big for your bed. They should not hang down onto the floor.  Have a firm chair that has side arms. You can use this for support while you get dressed.  Do not have throw rugs and other things on the floor that can make you trip. What can I do in the kitchen?  Clean up any spills right away.  Avoid walking on wet floors.  Keep items that you use a lot in easy-to-reach places.  If you need to reach something above you, use a strong step stool that has a grab bar.  Keep electrical cords out of the way.  Do not use floor polish or wax that makes floors slippery. If you must use wax, use non-skid floor wax.  Do not have  throw rugs and other things on the floor that can make you trip. What can I do with my stairs?  Do not leave any items on the stairs.  Make sure that there are handrails on both sides of the stairs and use them. Fix handrails that are broken or loose. Make sure that handrails are as long as the stairways.  Check any carpeting to make sure that it is firmly attached to the stairs. Fix any carpet that is loose or worn.  Avoid having throw rugs at the top or bottom of the stairs. If you do have throw rugs, attach them to the floor with carpet tape.  Make sure that you have a light switch at the top of the stairs and the bottom of the stairs. If you do not have them, ask someone to add them for you. What else can I do to help prevent falls?  Wear shoes that:  Do not have high heels.  Have rubber bottoms.  Are comfortable and fit you well.  Are closed  at the toe. Do not wear sandals.  If you use a stepladder:  Make sure that it is fully opened. Do not climb a closed stepladder.  Make sure that both sides of the stepladder are locked into place.  Ask someone to hold it for you, if possible.  Clearly mark and make sure that you can see:  Any grab bars or handrails.  First and last steps.  Where the edge of each step is.  Use tools that help you move around (mobility aids) if they are needed. These include:  Canes.  Walkers.  Scooters.  Crutches.  Turn on the lights when you go into a dark area. Replace any light bulbs as soon as they burn out.  Set up your furniture so you have a clear path. Avoid moving your furniture around.  If any of your floors are uneven, fix them.  If there are any pets around you, be aware of where they are.  Review your medicines with your doctor. Some medicines can make you feel dizzy. This can increase your chance of falling. Ask your doctor what other things that you can do to help prevent falls. This information is not intended to replace advice given to you by your health care provider. Make sure you discuss any questions you have with your health care provider. Document Released: 06/24/2009 Document Revised: 02/03/2016 Document Reviewed: 10/02/2014 Elsevier Interactive Patient Education  2017 [employer].

## 2019-08-01 ENCOUNTER — Telehealth: Payer: Self-pay

## 2019-08-01 ENCOUNTER — Ambulatory Visit: Payer: Self-pay

## 2019-08-01 NOTE — Telephone Encounter (Signed)
Returned patients call in regards to scheduling her colonoscopy.  LVM for her to call the office back to schedule.  Thanks Alyse Kathan

## 2019-08-04 ENCOUNTER — Other Ambulatory Visit: Payer: Self-pay | Admitting: Family Medicine

## 2019-08-04 ENCOUNTER — Telehealth: Payer: Self-pay | Admitting: Gastroenterology

## 2019-08-04 DIAGNOSIS — K219 Gastro-esophageal reflux disease without esophagitis: Secondary | ICD-10-CM

## 2019-08-04 NOTE — Telephone Encounter (Signed)
Pt left vm to schedule a colonoscopy   °

## 2019-08-04 NOTE — Telephone Encounter (Signed)
Requested medication (s) are due for refill today: yes  Requested medication (s) are on the active medication list: yes  Last refill: 06/30/2019  Future visit scheduled: yes  Notes to clinic:  Last filled by historical provider Review for refill   Requested Prescriptions  Pending Prescriptions Disp Refills   sertraline (ZOLOFT) 50 MG tablet [Pharmacy Med Name: Sertraline Hydrochloride 50mg  Tablet] 90 tablet     Sig: Take 1 tablet by mouth daily.     Psychiatry:  Antidepressants - SSRI Passed - 08/04/2019  5:25 AM      Passed - Valid encounter within last 6 months    Recent Outpatient Visits          2 weeks ago Morbid (severe) obesity due to excess calories [facility])   [facility] Steele Sizer, MD   2 months ago MDD (major depressive disorder), recurrent episode, moderate [facility])   [facility] Steele Sizer, MD   3 months ago Hospital discharge follow-up   [facility] Steele Sizer, MD   7 months ago Dyslipidemia associated with type 2 diabetes mellitus [facility])   [facility] Steele Sizer, MD   8 months ago Mild intermittent asthma with acute exacerbation   [facility] Steele Sizer, MD      Future Appointments            In 1 week Sharolyn Douglas Clance Boll, NP [facility], [place]   In 1 week Flora Lipps, MD [place]   In 4 weeks Steele Sizer, MD [facility], [place]   In 1 year  [place] - Completed PHQ-2 or PHQ-9 in the last 360 days.      Signed Prescriptions Disp Refills   omeprazole (PRILOSEC) 40 MG capsule 90 capsule 0    Sig: Take 1 capsule by mouth daily.     Gastroenterology: Proton Pump Inhibitors Passed - 08/04/2019  5:25 AM      Passed - Valid encounter within last 12 months    Recent Outpatient Visits          2 weeks ago Morbid (severe)  obesity due to excess calories [facility])   [facility] Steele Sizer, MD   2 months ago MDD (major depressive disorder), recurrent episode, moderate [facility])   [facility] Steele Sizer, MD   3 months ago Hospital discharge follow-up   [facility] Steele Sizer, MD   7 months ago Dyslipidemia associated with type 2 diabetes mellitus [facility])   [facility] Steele Sizer, MD   8 months ago Mild intermittent asthma with acute exacerbation   [facility] Steele Sizer, MD      Future Appointments            In 1 week Sharolyn Douglas, Clance Boll, NP [facility], [place]   In 1 week Flora Lipps, MD [place]   In 4 weeks Steele Sizer, MD [facility], [facility]   In 1 year  [facility], [facility]

## 2019-08-04 NOTE — Telephone Encounter (Signed)
Returned patients call to schedule colonoscopy.  LVM for her to call the office back.  Thanks [employer]

## 2019-08-05 ENCOUNTER — Other Ambulatory Visit: Payer: Self-pay

## 2019-08-05 ENCOUNTER — Telehealth: Payer: Self-pay

## 2019-08-05 DIAGNOSIS — Z1211 Encounter for screening for malignant neoplasm of colon: Secondary | ICD-10-CM

## 2019-08-05 NOTE — Telephone Encounter (Signed)
LVM for pt to call office to schedule her colonoscopy.  Thanks [employer]

## 2019-08-05 NOTE — Telephone Encounter (Signed)
Gastroenterology Pre-Procedure Review  Request Date: Tuesday 08/19/19 Requesting Physician: Dr. Vicente Males  PATIENT REVIEW QUESTIONS: The patient responded to the following health history questions as indicated:    1. Are you having any GI issues? no 2. Do you have a personal history of Polyps? no 3. Do you have a family history of Colon Cancer or Polyps? no 4. Diabetes Mellitus? yes (insulin) 5. Joint replacements in the past 12 months?no 6. Major health problems in the past 3 months?no 7. Any artificial heart valves, MVP, or defibrillator?no    MEDICATIONS & ALLERGIES:    Patient reports the following regarding taking any anticoagulation/antiplatelet therapy:   Plavix, Coumadin, Eliquis, Xarelto, Lovenox, Pradaxa, Brilinta, or Effient? no Aspirin? yes (81 mg daily)  Patient confirms/reports the following medications:  Current Outpatient Medications  Medication Sig Dispense Refill  . ACCU-CHEK SOFTCLIX LANCETS lancets     . acetaminophen (TYLENOL 8 HOUR) 650 MG CR tablet Take 1 tablet (650 mg total) by mouth every 8 (eight) hours as needed for pain. 90 tablet 2  . albuterol (PROVENTIL HFA) 108 (90 Base) MCG/ACT inhaler Inhale 2 puffs into the lungs 2 (two) times daily as needed for shortness of breath.     . Alcohol Swabs (B-D SINGLE USE SWABS REGULAR) PADS     . amLODipine (NORVASC) 5 MG tablet Take 1 tablet (5 mg total) by mouth daily. 30 tablet 0  . ASPIRIN LOW DOSE 81 MG EC tablet TAKE 1 TABLET (81 MG TOTAL) BY MOUTH DAILY. 30 tablet 0  . Blood Glucose Monitoring Suppl (ACCU-CHEK AVIVA PLUS) w/Device KIT     . Cyanocobalamin (VITAMIN B12 PO) Take 1 tablet by mouth daily.     . DULoxetine (CYMBALTA) 30 MG capsule Take 1 capsule (30 mg total) by mouth daily. 30 capsule 3  . furosemide (LASIX) 20 MG tablet Take 1 tablet by mouth daily. 90 tablet 0  . Icosapent Ethyl (VASCEPA) 1 g CAPS Take 2 capsules (2 g total) by mouth 2 (two) times daily. 360 capsule 1  . insulin NPH-regular Human  (70-30) 100 UNIT/ML injection Inject 100 Units into the skin 2 (two) times daily with a meal.    . irbesartan-hydrochlorothiazide (AVALIDE) 300-12.5 MG tablet Take 1 tablet by mouth daily. 90 tablet 1  . levothyroxine (SYNTHROID) 25 MCG tablet Take 1 tablet by mouth daily except, take 2 tablets by mouth on Sunday 100 tablet 0  . metFORMIN (GLUCOPHAGE-XR) 500 MG 24 hr tablet Take 500 mg by mouth. 2 tablets daily (total of 1000 mg daily)    . metoprolol tartrate (LOPRESSOR) 25 MG tablet Take 1 tablet (25 mg total) by mouth 2 (two) times daily. 180 tablet 1  . Misc Natural Products (OSTEO BI-FLEX ADV JOINT SHIELD PO) Take by mouth 2 (two) times daily.     . montelukast (SINGULAIR) 10 MG tablet Take 1 tablet (10 mg total) by mouth at bedtime. 90 tablet 1  . Omega-3 Fatty Acids (FISH OIL) 1000 MG CAPS Take 2,000 mg by mouth daily.     Marland Kitchen omeprazole (PRILOSEC) 40 MG capsule Take 1 capsule by mouth daily. 90 capsule 0  . Potassium 99 MG TABS Take by mouth.    . pregabalin (LYRICA) 300 MG capsule Take 300 mg by mouth 2 (two) times a day.    . Probiotic Product (PROBIOTIC-10 PO) Take by mouth.    Daryll Brod INSULIN SYRINGE 1ML/31G 31G X 5/16" 1 ML MISC     . rosuvastatin (CRESTOR) 20 MG tablet Take  1 tablet (20 mg total) by mouth daily. 90 tablet 1  . sertraline (ZOLOFT) 50 MG tablet Take 1 tablet by mouth daily. 90 tablet 0  . traZODone (DESYREL) 50 MG tablet Take 1 tablet (50 mg total) by mouth at bedtime as needed for sleep. 90 tablet 1  . vitamin C (ASCORBIC ACID) 500 MG tablet Take 500 mg by mouth 2 (two) times daily.     Marland Kitchen VITAMIN D, CHOLECALCIFEROL, PO Take 1,000 Units by mouth daily.      No current facility-administered medications for this visit.     Patient confirms/reports the following allergies:  Allergies  Allergen Reactions  . Codeine Other (See Comments)    "TRIPPED OUT"  DIDN'T LIKE THE [place]  . Atorvastatin     muscle pain  . Hydrocodone     itching  . Latex Rash  .  Zolpidem Other (See Comments)    Sleep walk    No orders of the defined types were placed in this encounter.   AUTHORIZATION INFORMATION Primary Insurance: 1D#: Group #:  Secondary Insurance: 1D#: Group #:  SCHEDULE INFORMATION: Date: Tuesday 08/19/19 Time: Location:[facility]

## 2019-08-13 ENCOUNTER — Telehealth: Payer: Self-pay

## 2019-08-13 ENCOUNTER — Ambulatory Visit
Admission: RE | Admit: 2019-08-13 | Discharge: 2019-08-13 | Disposition: A | Discharge status: Home / Self Care | Payer: Medicare HMO | Source: Ambulatory Visit | Attending: Family Medicine | Admitting: Family Medicine

## 2019-08-13 DIAGNOSIS — Z780 Asymptomatic menopausal state: Secondary | ICD-10-CM | POA: Diagnosis not present

## 2019-08-13 DIAGNOSIS — Z13820 Encounter for screening for osteoporosis: Secondary | ICD-10-CM | POA: Diagnosis not present

## 2019-08-13 NOTE — Telephone Encounter (Signed)
LVM returning patients call in regards to not receiving her colonoscopy instructions. She mentioned that she will be on the campus for a doctors appt today around 1 and could come by the office.  I contacted the mammography dept to ask them to tell her she could come by to pick up instructions, also LVM for her as well.  Thanks [employer]

## 2019-08-14 ENCOUNTER — Ambulatory Visit (INDEPENDENT_AMBULATORY_CARE_PROVIDER_SITE_OTHER): Payer: Medicare HMO | Admitting: Physician Assistant

## 2019-08-14 ENCOUNTER — Ambulatory Visit (INDEPENDENT_AMBULATORY_CARE_PROVIDER_SITE_OTHER): Payer: Medicare HMO | Admitting: Internal Medicine

## 2019-08-14 ENCOUNTER — Encounter: Payer: Self-pay | Admitting: Internal Medicine

## 2019-08-14 ENCOUNTER — Other Ambulatory Visit: Payer: Self-pay

## 2019-08-14 ENCOUNTER — Encounter: Payer: Self-pay | Admitting: Nurse Practitioner

## 2019-08-14 VITALS — BP 130/60 | HR 88 | Ht 62.0 in | Wt 257.0 lb

## 2019-08-14 VITALS — BP 130/60 | HR 81 | Temp 97.2°F | Ht 62.0 in | Wt 257.0 lb

## 2019-08-14 DIAGNOSIS — R0600 Dyspnea, unspecified: Secondary | ICD-10-CM

## 2019-08-14 DIAGNOSIS — E1165 Type 2 diabetes mellitus with hyperglycemia: Secondary | ICD-10-CM | POA: Diagnosis not present

## 2019-08-14 DIAGNOSIS — J449 Chronic obstructive pulmonary disease, unspecified: Secondary | ICD-10-CM

## 2019-08-14 DIAGNOSIS — G4733 Obstructive sleep apnea (adult) (pediatric): Secondary | ICD-10-CM

## 2019-08-14 DIAGNOSIS — E785 Hyperlipidemia, unspecified: Secondary | ICD-10-CM | POA: Diagnosis not present

## 2019-08-14 DIAGNOSIS — I5032 Chronic diastolic (congestive) heart failure: Secondary | ICD-10-CM | POA: Diagnosis not present

## 2019-08-14 DIAGNOSIS — I10 Essential (primary) hypertension: Secondary | ICD-10-CM | POA: Diagnosis not present

## 2019-08-14 DIAGNOSIS — E1140 Type 2 diabetes mellitus with diabetic neuropathy, unspecified: Secondary | ICD-10-CM

## 2019-08-14 DIAGNOSIS — R0609 Other forms of dyspnea: Secondary | ICD-10-CM

## 2019-08-14 MED ORDER — AMLODIPINE BESYLATE 5 MG PO TABS: 5.0000 mg | ORAL_TABLET | Freq: Every day | ORAL | 2 refills | Status: DC

## 2019-08-14 NOTE — Patient Instructions (Signed)
Albuterol as needed Follow-up with bariatric surgeon

## 2019-08-14 NOTE — Progress Notes (Signed)
Office Visit    Patient Name: Elberfeld COHICK Date of Encounter: 08/14/2019  Primary Care Provider:  Steele Sizer, MD Primary Cardiologist:  [name], MD  Chief Complaint    68 year old female with a history of HFpEF (EF >65%), hyperlipidemia, DM2, asthma, OSA not on CPAP, CKD IIIa / proteinuria (followed by [facility]), and morbid obesity, and who is being seen for 1 month follow-up after low risk myoview at [facility].   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)    Pneumonia    Rhinitis, allergic    Rosacea    Thyroid disease    Unspecified hearing loss    Unspecified hereditary and idiopathic peripheral neuropathy    Unspecified sleep apnea    Past Surgical History:  Procedure Laterality Date   ANKLE SURGERY Left approx Jan 2018   CATARACT EXTRACTION  01/2011   right   eye lid surgery  2013   bilateral   FOOT SURGERY     NECK SURGERY     TUBAL LIGATION     VAGINAL HYSTERECTOMY  1989    Allergies  Allergies  Allergen Reactions   Codeine Other (See Comments)    "TRIPPED OUT"  DIDN'T LIKE THE WAY IT FELT   Atorvastatin     muscle pain   Hydrocodone     itching   Latex Rash   Zolpidem Other (See Comments)    Sleep walk    History of Present Illness    68 year old female with PMH as above.  She reportedly cannot tolerate CPAP.  She has a history of longstanding (3 years) exertional dyspnea with minimal activity and decreased exercise tolerance, which she has reportedly attributed to her obesity. In the past, she has declined completing an ETT for this shortness of  breath / DOE. Echo showed nl LVEF and normal diastolic parameters. When last seen 07/16/2019, she completed a preop cardiovascular risk assessment in anticipation of bariatric surgery. It was noted her EKG showed septal Q waves, which were reflective of lead placement v septal MI. Recommendation was for myocardial PET/CT at [facility], which was completed and ruled low risk with no stress-induced perfusion abnormalities and EF greater than 65%. Mild coronary calcifications were noted, as well as mitral annular calcification.   Since that time, she reports she has not had any new or concerning symptoms and still awaits bariatric surgery. She continues to have chronic / stable SOB/DOE, which she still attributes to her obesity. She has noted very gradual increase in weight at home with clinic wt today unchanged from 07/2019 weight 257 lbs. She attributed her weight increase to the increased stress surrounding her difficulty in getting her bariatric surgery. She reported the surgery was deferred pending her obtaining a therapist, as she indicated recent depression on a pre-visit questionnaire for one of her appointments. She was checking her BP at home with SBP 120s-130s. She had stopped her amlodipine, as she thought that she was directed to  do so by our office. She was drinking sugar free beverages (~4 16 oz bottles a day). She was not adding salt to her food and was trying to avoid bread / starches, given her DM2. She drank 1 glass of wine every couple of months. No recent LEE beyond that of her usual LEE with L>R due to her knee issues. No orthopnea, though she was still unable to tolerate her CPAP. She was not able to workout due to her weight and L knee pain, which she also felt was a reason for her weight gain. No recent leg pain, change in edema, or erythema that would be concerning for DVT given her increased sedentary lifestyle. No recent CP, palpitations, or racing HR. She denied any s/sx of bleeding. She noted  some R flank pain, which her PCP thought related to her IBS.   Home Medications    Prior to Admission medications   Medication Sig Start Date End Date Taking? Authorizing Provider  ACCU-CHEK SOFTCLIX LANCETS lancets  08/26/17   [provider]  acetaminophen (TYLENOL 8 HOUR) 650 MG CR tablet Take 1 tablet (650 mg total) by mouth every 8 (eight) hours as needed for pain. 10/16/18   Steele Sizer, MD  albuterol (PROVENTIL HFA) 108 (90 Base) MCG/ACT inhaler Inhale 2 puffs into the lungs 2 (two) times daily as needed for shortness of breath.     [provider]  Alcohol Swabs (B-D SINGLE USE SWABS REGULAR) PADS  01/04/18   [provider]  amLODipine (NORVASC) 5 MG tablet Take 1 tablet (5 mg total) by mouth daily. 05/12/19   Steele Sizer, MD  ASPIRIN LOW DOSE 81 MG EC tablet TAKE 1 TABLET (81 MG TOTAL) BY MOUTH DAILY. 05/31/16   Steele Sizer, MD  Blood Glucose Monitoring Suppl (ACCU-CHEK AVIVA PLUS) w/Device KIT  11/02/15   [provider]  Cyanocobalamin (VITAMIN B12 PO) Take 1 tablet by mouth daily.     [provider]  DULoxetine (CYMBALTA) 30 MG capsule Take 1 capsule (30 mg total) by mouth daily. 05/07/19   Steele Sizer, MD  furosemide (LASIX) 20 MG tablet Take 1 tablet by mouth daily. 06/30/19   Steele Sizer, MD  Icosapent Ethyl (VASCEPA) 1 g CAPS Take 2 capsules (2 g total) by mouth 2 (two) times daily. 05/07/19   Steele Sizer, MD  insulin NPH-regular Human (70-30) 100 UNIT/ML injection Inject 100 Units into the skin 2 (two) times daily with a meal.    Lonia Farber, MD  irbesartan-hydrochlorothiazide (AVALIDE) 300-12.5 MG tablet Take 1 tablet by mouth daily. 03/07/19   Steele Sizer, MD  levothyroxine (SYNTHROID) 25 MCG tablet Take 1 tablet by mouth daily except, take 2 tablets by mouth on Sunday 06/30/19   Steele Sizer, MD  metFORMIN (GLUCOPHAGE-XR) 500 MG 24 hr tablet Take 500 mg by mouth. 2 tablets daily (total of 1000 mg  daily) 12/03/17   [provider]  metoprolol tartrate (LOPRESSOR) 25 MG tablet Take 1 tablet (25 mg total) by mouth 2 (two) times daily. 05/07/19   Steele Sizer, MD  Misc Natural Products (OSTEO BI-FLEX ADV JOINT SHIELD PO) Take by mouth 2 (two) times daily.     [provider]  montelukast (SINGULAIR) 10 MG tablet Take 1 tablet (10 mg total) by mouth at bedtime. 05/07/19   Steele Sizer, MD  Omega-3 Fatty Acids (FISH OIL) 1000 MG CAPS Take 2,000 mg by mouth daily.     [provider]  omeprazole (PRILOSEC) 40 MG  capsule Take 1 capsule by mouth daily. 08/04/19   Steele Sizer, MD  Potassium 99 MG TABS Take by mouth.    [provider]  pregabalin (LYRICA) 300 MG capsule Take 300 mg by mouth 2 (two) times a day. 03/06/19   [provider]  Probiotic Product (PROBIOTIC-10 PO) Take by mouth.    [provider]  Story 1ML/31G 31G X 5/16" 1 ML MISC  12/19/15   [provider]  rosuvastatin (CRESTOR) 20 MG tablet Take 1 tablet (20 mg total) by mouth daily. 05/07/19   Steele Sizer, MD  sertraline (ZOLOFT) 50 MG tablet Take 1 tablet by mouth daily. 08/04/19   Steele Sizer, MD  traZODone (DESYREL) 50 MG tablet Take 1 tablet (50 mg total) by mouth at bedtime as needed for sleep. 05/07/19   Steele Sizer, MD  vitamin C (ASCORBIC ACID) 500 MG tablet Take 500 mg by mouth 2 (two) times daily.     [provider]  VITAMIN D, CHOLECALCIFEROL, PO Take 1,000 Units by mouth daily.     [provider]    Review of Systems    She denies chest pain, palpitations, pnd, orthopnea, n, v, dizziness, syncope, edema (increased from her baseline with L>R due to knee issues), or early satiety. She reports unchanged chronic SOB/DOE. She reports slow weight gain, attributed to her inability to exercise and delayed bariatric surgery.   Physical Exam    VS:  BP 130/60 (BP Location: Right Arm, Patient Position: Sitting,  Cuff Size: Large)    Pulse 88    Ht 5' 2" (1.575 m)    Wt 257 lb (116.6 kg)    SpO2 98%    BMI 47.01 kg/m  , BMI Body mass index is 47.01 kg/m. GEN: Obese female, seated in a wheelchair, in no acute distress. HEENT: normal. Neck: Supple, JVD difficult to assess 2/2 body habitus, carotid bruits, or masses. Cardiac: RRR, 1/6 systolic murmur. No rubs, or gallops. No clubbing, cyanosis, edema.  Radials/DP/PT 2+ and equal bilaterally.  Respiratory:  Respirations regular and unlabored, clear to auscultation bilaterally. GI: Soft, nontender, nondistended, BS + x 4. MS: no deformity or atrophy. Skin: warm and dry, no rash. Neuro:  Strength and sensation are intact. Psych: Normal affect.  Accessory Clinical Findings    ECG personally reviewed by me today - NSR, 88bpm, previous septal infarct (over lead placement, as this finding is now a consistent finding).  All other systems reviewed and are otherwise negative except as noted above. - no acute changes.  07/10/2019 Labs ([facility]) Hgb 11.3 (stable), RBC 3.98, Hct 34.2 Cr 1.35 (03/2019)  1.23 with BUN 45  54, eGFR 44 LDL 80  Tg 201, total cholesterol 152 04/2019 TSH 1.10  Echo 04/03/2019  1. The left ventricle has hyperdynamic systolic function, with an ejection fraction of >65%. The cavity size was normal. There is mildly increased left ventricular wall thickness. Left ventricular diastolic parameters were normal. No evidence of left  ventricular regional wall motion abnormalities.  2. The right ventricle has normal systolic function. The cavity was normal. There is no increase in right ventricular wall thickness. Right ventricular systolic pressure could not be assessed.  3. The mitral valve is degenerative. Mild thickening of the mitral valve leaflet. There is moderate mitral annular calcification present.  4. The aortic valve has an indeterminate number of cusps.  5. The aorta is normal in size and structure.  6. The interatrial  septum was not  well visualized.  07/2019 NM Myoview Exam: PET MYOCARDIAL PERFUSION MULTIPLE (Rest/Stress Rb - Regadenoson) +++++++++++++++++++++++++++++++++++++++++++++++++ Impressions: - Normal myocardial perfusion study - No evidence for significant ischemia or scar is noted. - During stress: Global systolic function is normal. The ejection fraction  was greater than 65%. - Mild coronary calcifications are noted - Mitral annular calcification is noted  +++++++++++++++++++++++++++++++++++++++++++++++++  Assessment & Plan    1.  Dyspnea on exertion --Unchanged and present for at least 3 years, attributed to morbid obesity, deconditioning, asthma, and untreated OSA. She was still unable to tolerate her CPAP. Previous echo this year with nl LVEF. Due to risk factors for CAD, she underwent PET CT Myocardial perfusion study at [facility] on 07/23/2019 that was ruled low risk. 98% ORA today. No further cardiac workup needed at this time. Continue medical management.   2. Chronic HFpEF --Unchanged SOB/DOE as above. Weight remains stable at 257 lbs today and appears euvolemic on exam with consideration of her body habitus. No recent sx concerning for HF exacerbation. --Given her kidney function, recommend continued follow-up with [facility] as on Lasix, ARB, and potassium supplementation. Most recent labs performed by [facility] 06/2019 with no medication changes recommended from a renal standpoint. As below, restart amlodipine. No other medication changes.  3. HTN --Recently discontinued her amlodipine, which she thought was recommended by our clinic; however, on review of recent documentation, no evidence of instruction to discontinue. SBP 130s today. Will restart amlodipine 53m daily. Continue with BB and ibesartan/HCTZ. Continue to monitor BP daily.  4.HLD --Continue statin therapy with most recent LDL 80 and total cholesterol 152.  5. Morbid obesity --Upcoming bariatric surgery, pending therapist (per  patient). Lifestyle changes recommended.    6. DM2 --[facility]. Continue metformin (as indicated by [facility], as long as eGFR is 30 or above).  Disposition: Restart amlodipine. No further cardiac workup needed before upcoming bariatric surgery and as previously documented in her chart. Follow-up in 4 months.    JArvil Chaco PA-C 08/14/2019, 2:49 PM

## 2019-08-14 NOTE — Patient Instructions (Signed)
Medication Instructions:  Your physician has recommended you make the following change in your medication:  1- START Amlodipine 5 mg by mouth once a day.  *If you need a refill on your cardiac medications before your next appointment, please call your pharmacy*  Lab Work: none If you have labs (blood work) drawn today and your tests are completely normal, you will receive your results only by: Marland Kitchen MyChart Message (if you have MyChart) OR . A paper copy in the mail If you have any lab test that is abnormal or we need to change your treatment, we will call you to review the results.  Testing/Procedures: none  Follow-Up: At [facility], you and your health needs are our priority.  As part of our continuing mission to provide you with exceptional heart care, we have created designated Provider Care Teams.  These Care Teams include your primary Cardiologist (physician) and Advanced Practice Providers (APPs -  Physician Assistants and Nurse Practitioners) who all work together to provide you with the care you need, when you need it.  Your next appointment:   6 month(s)  The format for your next appointment:   In Person  Provider:    You may see DR Harrell Gave END  or one of the following Advanced Practice Providers on your designated Care Team:    Murray Hodgkins, NP  Christell Faith, PA-C  Marrianne Mood, PA-C

## 2019-08-14 NOTE — Progress Notes (Signed)
[facility] Consultation        Date: 08/14/2019  MRN# [id number] Summer [name] Aug 08, 1951   Summer [name] is a 68 y.o. old female seen in consultation for chief complaint of:    No chief complaint on file.   HPI:   68 year old morbidly obese white female with persistent dyspnea on exertion Patient does have a history of COPD Pulmonary function test relayed to patient Patient does have intermittent reactive airways disease with asthma as well  Patient does have persistent volume overload and peripheral edema Patient of does have a history of sleep apnea but is intolerant of CPAP  Patient uses albuterol as needed Ruthe Mannan has been prescribed in the past and did not help Continues to be on PPI  She had ankle surgery about 1 and half years ago and did not have any respiratory complications  At this time I did explain to the patient she has low to moderate risk for postop pulmonary complications if she were to undergo gastric bypass surgery  No of COPD exacerbation at this time No evidence of heart failure at this time No evidence or signs of infection at this time No respiratory distress No fevers, chills, nausea, vomiting, diarrhea No evidence of lower extremity edema No evidence hemoptysis\   CXR 07/16/17>>Bibasilar atelectasis.   **Spirometry 10/31/2018>> tracings personally reviewed.  FVC is 64% predicted, FEV1 is 60% predicted.  Ratio is 70%.  Expiratory time is 6.1 seconds.  Overall this test is consistent with moderate obstructive lung disease. **Echo 07/04/17; QQ=59%>> RV systolic function reported as "normal" **08/18/16 CBC>> Eos=438 **05/12/14; Echo>> EF=55%; Pulm systolic pressure was normal.   Medication:    Current Outpatient Medications:    ACCU-CHEK SOFTCLIX LANCETS lancets, , Disp: , Rfl:    acetaminophen (TYLENOL 8 HOUR) 650 MG CR tablet, Take 1 tablet (650 mg total) by mouth every 8 (eight) hours as needed for pain., Disp: 90 tablet,  Rfl: 2   albuterol (PROVENTIL HFA) 108 (90 Base) MCG/ACT inhaler, Inhale 2 puffs into the lungs 2 (two) times daily as needed for shortness of breath. , Disp: , Rfl:    Alcohol Swabs (B-D SINGLE USE SWABS REGULAR) PADS, , Disp: , Rfl:    amLODipine (NORVASC) 5 MG tablet, Take 1 tablet (5 mg total) by mouth daily., Disp: 90 tablet, Rfl: 2   ASPIRIN LOW DOSE 81 MG EC tablet, TAKE 1 TABLET (81 MG TOTAL) BY MOUTH DAILY., Disp: 30 tablet, Rfl: 0   Blood Glucose Monitoring Suppl (ACCU-CHEK AVIVA PLUS) w/Device KIT, , Disp: , Rfl:    Cyanocobalamin (VITAMIN B12 PO), Take 1 tablet by mouth daily. , Disp: , Rfl:    DULoxetine (CYMBALTA) 30 MG capsule, Take 1 capsule (30 mg total) by mouth daily., Disp: 30 capsule, Rfl: 3   furosemide (LASIX) 20 MG tablet, Take 1 tablet by mouth daily., Disp: 90 tablet, Rfl: 0   Icosapent Ethyl (VASCEPA) 1 g CAPS, Take 2 capsules (2 g total) by mouth 2 (two) times daily., Disp: 360 capsule, Rfl: 1   insulin NPH-regular Human (70-30) 100 UNIT/ML injection, Inject 100 Units into the skin 2 (two) times daily with a meal., Disp: , Rfl:    irbesartan-hydrochlorothiazide (AVALIDE) 300-12.5 MG tablet, Take 1 tablet by mouth daily., Disp: 90 tablet, Rfl: 1   levothyroxine (SYNTHROID) 25 MCG tablet, Take 1 tablet by mouth daily except, take 2 tablets by mouth on Sunday, Disp: 100 tablet, Rfl: 0   metFORMIN (GLUCOPHAGE-XR) 500 MG 24  hr tablet, Take 500 mg by mouth. 2 tablets daily (total of 1000 mg daily), Disp: , Rfl:    metoprolol tartrate (LOPRESSOR) 25 MG tablet, Take 1 tablet (25 mg total) by mouth 2 (two) times daily., Disp: 180 tablet, Rfl: 1   Misc Natural Products (OSTEO BI-FLEX ADV JOINT SHIELD PO), Take by mouth 2 (two) times daily. , Disp: , Rfl:    montelukast (SINGULAIR) 10 MG tablet, Take 1 tablet (10 mg total) by mouth at bedtime., Disp: 90 tablet, Rfl: 1   Omega-3 Fatty Acids (FISH OIL) 1000 MG CAPS, Take 2,000 mg by mouth daily. , Disp: , Rfl:     omeprazole (PRILOSEC) 40 MG capsule, Take 1 capsule by mouth daily., Disp: 90 capsule, Rfl: 0   Potassium 99 MG TABS, Take by mouth., Disp: , Rfl:    pregabalin (LYRICA) 300 MG capsule, Take 300 mg by mouth 2 (two) times a day., Disp: , Rfl:    Probiotic Product (PROBIOTIC-10 PO), Take by mouth., Disp: , Rfl:    RELION INSULIN SYRINGE 1ML/31G 31G X 5/16" 1 ML MISC, , Disp: , Rfl:    rosuvastatin (CRESTOR) 20 MG tablet, Take 1 tablet (20 mg total) by mouth daily., Disp: 90 tablet, Rfl: 1   sertraline (ZOLOFT) 50 MG tablet, Take 1 tablet by mouth daily., Disp: 90 tablet, Rfl: 0   traZODone (DESYREL) 50 MG tablet, Take 1 tablet (50 mg total) by mouth at bedtime as needed for sleep., Disp: 90 tablet, Rfl: 1   vitamin C (ASCORBIC ACID) 500 MG tablet, Take 500 mg by mouth 2 (two) times daily. , Disp: , Rfl:    VITAMIN D, CHOLECALCIFEROL, PO, Take 1,000 Units by mouth daily. , Disp: , Rfl:    Allergies:  Codeine, Atorvastatin, Hydrocodone, Latex, and Zolpidem   Review of Systems:  Gen:  Denies  fever, sweats, chills weight loss  HEENT: Denies blurred vision, double vision, ear pain, eye pain, hearing loss, nose bleeds, sore throat Cardiac:  No dizziness, chest pain or heaviness, chest tightness,edema, No JVD Resp:   No cough, -sputum production, -shortness of breath,-wheezing, -hemoptysis,  Gi: Denies swallowing difficulty, stomach pain, nausea or vomiting, diarrhea, constipation, bowel incontinence Gu:  Denies bladder incontinence, burning urine Ext:   Denies Joint pain, stiffness or swelling Skin: Denies  skin rash, easy bruising or bleeding or hives Endoc:  Denies polyuria, polydipsia , polyphagia or weight change Psych:   Denies depression, insomnia or hallucinations  Other:  All other systems negative  BP 130/60 (BP Location: Left Arm, Cuff Size: Normal)    Pulse 81    Temp (!) 97.2 F (36.2 C) (Temporal)    Ht _0  (1.575 m)    Wt 257 lb (116.6 kg)    SpO2 98%    BMI 47.01  kg/m    Physical Examination:   GENERAL:NAD, no fevers, chills, no weakness no fatigue HEAD: Normocephalic, atraumatic.  EYES: PERLA, EOMI No scleral icterus.  PULMONARY: CTA B/L no wheezing, rhonchi, crackles CARDIOVASCULAR: S1 and S2. Regular rate and rhythm. No murmurs GASTROINTESTINAL: Soft, nontender, nondistended. Positive bowel sounds.  MUSCULOSKELETAL: No swelling, clubbing, or edema.  NEUROLOGIC: No gross focal neurological deficits. 5/5 strength all extremities SKIN: No ulceration, lesions, rashes, or cyanosis.  PSYCHIATRIC: Insight, judgment intact. -depression -anxiety ALL OTHER ROS ARE NEGATIVE         RADIOLOGY:  Dg Bone Density  Result Date: 08/13/2019 EXAM: DUAL X-RAY ABSORPTIOMETRY (DXA) FOR BONE MINERAL DENSITY IMPRESSION: Technologist: SCE PATIENT BIOGRAPHICAL:  Name: Summer [name], Summer [name] Patient ID: [id number] Birth Date: 10/13/1950 Height: 61.0 in. Gender: Female Exam Date: 08/13/2019 Weight: 257.2 lbs. Indications: Advanced Age, Caucasian, Diabetic, Height Loss, History of Fracture (Adult), History of Spinal Surgery, Hypothyroid, Hysterectomy, Oophorectomy Unilateral, Postmenopausal Fractures: Left ankle Treatments: Vitamin D, Metformin, Insulin, Omeprazole, Levothyroxine ASSESSMENT: The BMD measured at Femur Neck Left is 1.048 g/cm2 with a T-score of 0.1. This patient is considered NORMAL according to [facility]) criteria. The scan quality is good. Lumbar spine was not utilized due to advanced degenerative changes. Site Region Measured Measured WHO Young Adult BMD Date       Age      Classification T-score DualFemur Neck Left 08/13/2019 70.5 Normal 0.1 1.048 g/cm2 DualFemur Neck Left 06/26/2017 68.4 Normal 0.7 1.129 g/cm2 DualFemur Total Mean 08/13/2019 70.5 Normal 1.8 1.241 g/cm2 DualFemur Total Mean 06/26/2017 68.4 Normal 1.7 1.223 g/cm2 Left Forearm Radius 33% 08/13/2019 70.5 Normal 0.8 0.942 g/cm2 Left Forearm Radius 33% 06/26/2017 68.4 Normal 1.2 0.983  g/cm2 World Health Organization [facility]) criteria for post-menopausal, Caucasian Women: Normal:       T-score at or above -1 SD Osteopenia:   T-score between -1 and -2.5 SD Osteoporosis: T-score at or below -2.5 SD RECOMMENDATIONS: 1. All patients should optimize calcium and vitamin D intake. 2. Consider FDA-approved medical therapies in postmenopausal women and men aged 74 years and older, based on the following: a. A hip or vertebral(clinical or morphometric) fracture b. T-score < -2.5 at the femoral neck or spine after appropriate evaluation to exclude secondary causes c. Low bone mass (T-score between -1.0 and -2.5 at the femoral neck or spine) and a 10-year probability of a hip fracture > 3% or a 10-year probability of a major osteoporosis-related fracture > 20% based on the [place]-adapted WHO algorithm d. Clinician judgment and/or patient preferences may indicate treatment for people with 10-year fracture probabilities above or below these levels FOLLOW-UP: People with diagnosed cases of osteoporosis or at high risk for fracture should have regular bone mineral density tests. For patients eligible for Medicare, routine testing is allowed once every 2 years. The testing frequency can be increased to one year for patients who have rapidly progressing disease, those who are receiving or discontinuing medical therapy to restore bone mass, or have additional risk factors. I have reviewed this report, and agree with the above findings. Mark A. Thornton Papas, M.D. [facility] Electronically Signed   By: Lavonia Dana M.D.   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 Discussed the risk of upcoming bariatric surgery with the patient Patient does have a low to moderate risk of postop pulmonary complications including bleeding infection lung collapse pneumonia need for prolonged mechanical ventilation   - ABG is unlikely to aid in pre-op evaluation   General Risk Reduction Strategies: - All patients warrant post-operative incentive spirometry. For those with obstruction, also consider flutter valve. - Early ambulation, PT/OT - DVT prophylaxis where appropriate - Adequate pain control without  oversedation    Dyspnea and exertion with restrictive lung disease most likely due to her morbid obesity along with multifactorial etiologies of asthma COPD and deconditioned state  GERD Continue PPI as prescribed  Volume overload Patient is on Lasix  OSA Patient is intolerant of CPAP  Obesity -recommend significant weight loss -recommend changing diet  Deconditioned state -Recommend increased daily activity and exercise  COVID-19 EDUCATION: The signs and symptoms of COVID-19 were discussed with the patient and how to seek care for testing.  The importance of social distancing was discussed today. Hand Washing Techniques and avoid touching face was advised.     MEDICATION ADJUSTMENTS/LABS AND TESTS ORDERED: Albuterol as needed   CURRENT MEDICATIONS REVIEWED AT LENGTH WITH PATIENT TODAY   Patient satisfied with Plan of action and management. All questions answered  Follow up in 1 year   Akiah Bauch Patricia Pesa, M.D.  [facility]  Medical Director [place] Director [facility] Cardio-Pulmonary Department

## 2019-08-15 ENCOUNTER — Other Ambulatory Visit
Admission: RE | Admit: 2019-08-15 | Discharge: 2019-08-15 | Disposition: A | Discharge status: Home / Self Care | Payer: Medicare HMO | Source: Ambulatory Visit | Attending: Gastroenterology | Admitting: Gastroenterology

## 2019-08-15 DIAGNOSIS — Z20828 Contact with and (suspected) exposure to other viral communicable diseases: Secondary | ICD-10-CM | POA: Diagnosis not present

## 2019-08-15 DIAGNOSIS — Z01812 Encounter for preprocedural laboratory examination: Secondary | ICD-10-CM | POA: Insufficient documentation

## 2019-08-15 LAB — SARS CORONAVIRUS 2 (TAT 6-24 HRS): SARS Coronavirus 2: NEGATIVE

## 2019-08-18 ENCOUNTER — Telehealth: Payer: Self-pay

## 2019-08-18 ENCOUNTER — Telehealth: Payer: Self-pay | Admitting: Gastroenterology

## 2019-08-18 NOTE — Telephone Encounter (Signed)
Returned patients call LVM for her to call back in regards to colonoscopy questions.  Thanks [employer]

## 2019-08-18 NOTE — Telephone Encounter (Signed)
Pt left vm she has a question regarding her prep for tomorrow

## 2019-08-18 NOTE — Telephone Encounter (Signed)
Returned patients call regarding colonoscopy questions.  LVM for her to call me back.  I did make her aware that I will be in clinic this afternoon from 1pm to 5pm, and I would do my best to return her call before I leave the office.  Thanks [employer]

## 2019-08-18 NOTE — Telephone Encounter (Signed)
Pt left vm returning a call regarding a prev. Message she left

## 2019-08-18 NOTE — Telephone Encounter (Signed)
Returned patients call again to address her questions regarding her colonoscopy.  Call went to voicemail.  I asked her to call me back tomorrow although her procedure is tomorrow.  Thanks [employer]

## 2019-08-19 ENCOUNTER — Ambulatory Visit
Admission: RE | Admit: 2019-08-19 | Discharge: 2019-08-19 | Disposition: A | Discharge status: Home / Self Care | Payer: Medicare HMO | Attending: Gastroenterology | Admitting: Gastroenterology

## 2019-08-19 ENCOUNTER — Other Ambulatory Visit: Payer: Self-pay

## 2019-08-19 ENCOUNTER — Ambulatory Visit: Payer: Medicare HMO | Admitting: Registered Nurse

## 2019-08-19 ENCOUNTER — Encounter
Admission: RE | Disposition: A | Discharge status: Home / Self Care | Payer: Self-pay | Source: Home / Self Care | Attending: Gastroenterology

## 2019-08-19 DIAGNOSIS — D125 Benign neoplasm of sigmoid colon: Secondary | ICD-10-CM | POA: Diagnosis not present

## 2019-08-19 DIAGNOSIS — E1122 Type 2 diabetes mellitus with diabetic chronic kidney disease: Secondary | ICD-10-CM | POA: Insufficient documentation

## 2019-08-19 DIAGNOSIS — Z7982 Long term (current) use of aspirin: Secondary | ICD-10-CM | POA: Diagnosis not present

## 2019-08-19 DIAGNOSIS — Z79899 Other long term (current) drug therapy: Secondary | ICD-10-CM | POA: Diagnosis not present

## 2019-08-19 DIAGNOSIS — Z79890 Hormone replacement therapy (postmenopausal): Secondary | ICD-10-CM | POA: Diagnosis not present

## 2019-08-19 DIAGNOSIS — F329 Major depressive disorder, single episode, unspecified: Secondary | ICD-10-CM | POA: Diagnosis not present

## 2019-08-19 DIAGNOSIS — M1990 Unspecified osteoarthritis, unspecified site: Secondary | ICD-10-CM | POA: Diagnosis not present

## 2019-08-19 DIAGNOSIS — Z794 Long term (current) use of insulin: Secondary | ICD-10-CM | POA: Diagnosis not present

## 2019-08-19 DIAGNOSIS — E785 Hyperlipidemia, unspecified: Secondary | ICD-10-CM | POA: Insufficient documentation

## 2019-08-19 DIAGNOSIS — K635 Polyp of colon: Secondary | ICD-10-CM

## 2019-08-19 DIAGNOSIS — Z1211 Encounter for screening for malignant neoplasm of colon: Secondary | ICD-10-CM

## 2019-08-19 DIAGNOSIS — K219 Gastro-esophageal reflux disease without esophagitis: Secondary | ICD-10-CM | POA: Diagnosis not present

## 2019-08-19 DIAGNOSIS — Z888 Allergy status to other drugs, medicaments and biological substances status: Secondary | ICD-10-CM | POA: Diagnosis not present

## 2019-08-19 DIAGNOSIS — Z885 Allergy status to narcotic agent status: Secondary | ICD-10-CM | POA: Insufficient documentation

## 2019-08-19 DIAGNOSIS — I129 Hypertensive chronic kidney disease with stage 1 through stage 4 chronic kidney disease, or unspecified chronic kidney disease: Secondary | ICD-10-CM | POA: Insufficient documentation

## 2019-08-19 DIAGNOSIS — Z6841 Body Mass Index (BMI) 40.0 and over, adult: Secondary | ICD-10-CM | POA: Diagnosis not present

## 2019-08-19 DIAGNOSIS — G4733 Obstructive sleep apnea (adult) (pediatric): Secondary | ICD-10-CM | POA: Insufficient documentation

## 2019-08-19 DIAGNOSIS — G609 Hereditary and idiopathic neuropathy, unspecified: Secondary | ICD-10-CM | POA: Insufficient documentation

## 2019-08-19 DIAGNOSIS — N183 Chronic kidney disease, stage 3 unspecified: Secondary | ICD-10-CM | POA: Insufficient documentation

## 2019-08-19 DIAGNOSIS — E669 Obesity, unspecified: Secondary | ICD-10-CM | POA: Diagnosis not present

## 2019-08-19 DIAGNOSIS — J45909 Unspecified asthma, uncomplicated: Secondary | ICD-10-CM | POA: Insufficient documentation

## 2019-08-19 HISTORY — PX: COLONOSCOPY WITH PROPOFOL: SHX5780

## 2019-08-19 LAB — GLUCOSE, CAPILLARY: Glucose-Capillary: 117 mg/dL — ABNORMAL HIGH (ref 70–99)

## 2019-08-19 SURGERY — COLONOSCOPY WITH PROPOFOL
Anesthesia: General

## 2019-08-19 MED ORDER — LIDOCAINE HCL (PF) 2 % IJ SOLN
INTRAMUSCULAR | Status: AC
  Filled 2019-08-19: qty 10

## 2019-08-19 MED ORDER — SODIUM CHLORIDE 0.9 % IV SOLN
INTRAVENOUS | Status: DC
  Administered 2019-08-19: 08:00:00 via INTRAVENOUS

## 2019-08-19 MED ORDER — EPHEDRINE SULFATE 50 MG/ML IJ SOLN
INTRAMUSCULAR | Status: AC
  Filled 2019-08-19: qty 1

## 2019-08-19 MED ORDER — PHENYLEPHRINE HCL (PRESSORS) 10 MG/ML IV SOLN
INTRAVENOUS | Status: DC | PRN
  Administered 2019-08-19: 100 ug via INTRAVENOUS
  Administered 2019-08-19 (×2): 150 ug via INTRAVENOUS
  Administered 2019-08-19: 100 ug via INTRAVENOUS

## 2019-08-19 MED ORDER — PROPOFOL 500 MG/50ML IV EMUL
INTRAVENOUS | Status: DC | PRN
  Administered 2019-08-19: 75 ug/kg/min via INTRAVENOUS

## 2019-08-19 MED ORDER — PHENYLEPHRINE HCL (PRESSORS) 10 MG/ML IV SOLN
INTRAVENOUS | Status: AC
  Filled 2019-08-19: qty 1

## 2019-08-19 MED ORDER — DEXMEDETOMIDINE HCL IN NACL 80 MCG/20ML IV SOLN
INTRAVENOUS | Status: AC
  Filled 2019-08-19: qty 20

## 2019-08-19 MED ORDER — PROPOFOL 500 MG/50ML IV EMUL
INTRAVENOUS | Status: AC
  Filled 2019-08-19: qty 50

## 2019-08-19 MED ORDER — LIDOCAINE HCL (CARDIAC) PF 100 MG/5ML IV SOSY
PREFILLED_SYRINGE | INTRAVENOUS | Status: DC | PRN
  Administered 2019-08-19: 60 mg via INTRAVENOUS
  Administered 2019-08-19: 40 mg via INTRAVENOUS

## 2019-08-19 MED ORDER — PROPOFOL 10 MG/ML IV BOLUS
INTRAVENOUS | Status: DC | PRN
  Administered 2019-08-19: 20 mg via INTRAVENOUS
  Administered 2019-08-19: 10 mg via INTRAVENOUS

## 2019-08-19 MED ORDER — SUCCINYLCHOLINE CHLORIDE 20 MG/ML IJ SOLN
INTRAMUSCULAR | Status: AC
  Filled 2019-08-19: qty 1

## 2019-08-19 MED ORDER — DEXMEDETOMIDINE HCL 200 MCG/2ML IV SOLN
INTRAVENOUS | Status: DC | PRN
  Administered 2019-08-19: 12 ug via INTRAVENOUS
  Administered 2019-08-19 (×2): 4 ug via INTRAVENOUS

## 2019-08-19 NOTE — Op Note (Signed)
[facility] Patient Name: Summer [name] Procedure Date: 08/19/2019 8:01 AM MRN: [id number] Account #: [id number] Date of Birth: [birth year] Admit Type: Outpatient Age: 68 Room: [facility] ENDO ROOM 1 Gender: Female Note Status: Finalized Procedure:             Colonoscopy Indications:           Screening for colorectal malignant neoplasm Providers:             Jonathon Bellows MD, MD Medicines:             Monitored Anesthesia Care Complications:         No immediate complications. Procedure:             Pre-Anesthesia Assessment:                        - Prior to the procedure, a History and Physical was                         performed, and patient medications, allergies and                         sensitivities were reviewed. The patient's tolerance                         of previous anesthesia was reviewed.                        - The risks and benefits of the procedure and the                         sedation options and risks were discussed with the                         patient. All questions were answered and informed                         consent was obtained.                        - ASA Grade Assessment: II - A patient with mild                         systemic disease.                        After obtaining informed consent, the colonoscope was                         passed under direct vision. Throughout the procedure,                         the patient's blood pressure, pulse, and oxygen                         saturations were monitored continuously. The                         Colonoscope was introduced through the anus and  advanced to the the cecum, identified by the                         appendiceal orifice, IC valve and transillumination.                         The colonoscopy was performed with ease. The patient                         tolerated the procedure well. The quality of the bowel        preparation was excellent. Findings:      The perianal and digital rectal examinations were normal.      A 3 mm polyp was found in the sigmoid colon. The polyp was sessile. The       polyp was removed with a cold snare. Resection and retrieval were       complete.      The exam was otherwise without abnormality on direct and retroflexion       views. Impression:            - One 3 mm polyp in the sigmoid colon, removed with a                         cold snare. Resected and retrieved.                        - The examination was otherwise normal on direct and                         retroflexion views. Recommendation:        - Discharge patient to home (with escort).                        - Resume previous diet.                        - Continue present medications.                        - Repeat colonoscopy for surveillance based on                         pathology results. Procedure Code(s):     --- Professional ---                        [id number], Colonoscopy, flexible; with removal of                         tumor(s), polyp(s), or other lesion(s) by snare                         technique Diagnosis Code(s):     --- Professional ---                        Z12.11, Encounter for screening for malignant neoplasm                         of colon  K63.5, Polyp of colon CPT copyright 2019 American Medical Association. All rights reserved. The codes documented in this report are preliminary and upon coder review may  be revised to meet current compliance requirements. Jonathon Bellows, MD Jonathon Bellows MD, MD 08/19/2019 8:34:26 AM This report has been signed electronically. Number of Addenda: 0 Note Initiated On: 08/19/2019 8:01 AM Scope Withdrawal Time: 0 hours 15 minutes 10 seconds  Total Procedure Duration: 0 hours 19 minutes 38 seconds  Estimated Blood Loss:  Estimated blood loss: none.      [facility]

## 2019-08-19 NOTE — Anesthesia Preprocedure Evaluation (Signed)
Anesthesia Evaluation  Patient identified by MRN, date of birth, ID band Patient awake    Reviewed: Allergy & Precautions, H&P , NPO status , Patient's Chart, lab work & pertinent test results, reviewed documented beta blocker date and time   Airway Mallampati: III   Neck ROM: full    Dental  (+) Poor Dentition   Pulmonary shortness of breath and with exertion, asthma , sleep apnea , pneumonia, resolved,    Pulmonary exam normal        Cardiovascular Exercise Tolerance: Poor hypertension, On Medications negative cardio ROS Normal cardiovascular exam Rhythm:regular Rate:Normal     Neuro/Psych  Headaches, PSYCHIATRIC DISORDERS Depression  Neuromuscular disease    GI/Hepatic Neg liver ROS, hiatal hernia, GERD  ,  Endo/Other  diabetesHypothyroidism   Renal/GU Renal disease  negative genitourinary   Musculoskeletal   Abdominal   Peds  Hematology  (+) Blood dyscrasia, anemia ,   Anesthesia Other Findings Past Medical History: No date: Anemia No date: Arthritis No date: Asthma No date: Carpal tunnel syndrome No date: Chronic kidney disease No date: Depressive disorder No date: Diabetes mellitus No date: Dyspnea No date: GERD (gastroesophageal reflux disease) No date: Headache No date: History of hiatal hernia No date: Hyperlipidemia No date: Hypertension No date: Insomnia No date: Lumbago No date: Memory loss No date: Obesity No date: 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) No date: Pneumonia No date: Rhinitis, allergic No date: Rosacea No date: Thyroid disease No date: Unspecified hearing loss No date: Unspecified hereditary and idiopathic peripheral neuropathy No date: Unspecified sleep apnea Past Surgical History: approx Jan 2018: ANKLE SURGERY; Left 01/2011: CATARACT EXTRACTION     Comment:  right 2013: eye lid surgery     Comment:  bilateral No date: FOOT SURGERY No date: NECK SURGERY No date: TUBAL  LIGATION 1989: VAGINAL HYSTERECTOMY BMI    Body Mass Index: 46.64 kg/m     Reproductive/Obstetrics negative OB ROS                             Anesthesia Physical Anesthesia Plan  ASA: III  Anesthesia Plan: General   Post-op Pain Management:    Induction:   PONV Risk Score and Plan:   Airway Management Planned:   Additional Equipment:   Intra-op Plan:   Post-operative Plan:   Informed Consent: I have reviewed the patients History and Physical, chart, labs and discussed the procedure including the risks, benefits and alternatives for the proposed anesthesia with the patient or authorized representative who has indicated his/her understanding and acceptance.     Dental Advisory Given  Plan Discussed with: CRNA  Anesthesia Plan Comments:         Anesthesia Quick Evaluation

## 2019-08-19 NOTE — H&P (Signed)
Jonathon Bellows, MD [address] [place], [place], [place], [address] [address], [place], [place], [place], [address] Phone: [phone]  Fax: [phone]  Primary Care Physician:  Steele Sizer, MD   Pre-Procedure History & Physical: HPI:  Summer [name] is a 68 y.o. female is here for an colonoscopy.   Past Medical History:  Diagnosis Date  . Anemia   . Arthritis   . Asthma   . Carpal tunnel syndrome   . Chronic kidney disease   . Depressive disorder   . Diabetes mellitus   . Dyspnea   . GERD (gastroesophageal reflux disease)   . Headache   . History of hiatal hernia   . Hyperlipidemia   . Hypertension   . Insomnia   . Lumbago   . Memory loss   . Obesity   . 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)   . Pneumonia   . Rhinitis, allergic   . Rosacea   . Thyroid disease   . Unspecified hearing loss   . Unspecified hereditary and idiopathic peripheral neuropathy   . Unspecified sleep apnea     Past Surgical History:  Procedure Laterality Date  . ANKLE SURGERY Left approx Jan 2018  . CATARACT EXTRACTION  01/2011   right  . eye lid surgery  2013   bilateral  . FOOT SURGERY    . NECK SURGERY    . TUBAL LIGATION    . VAGINAL HYSTERECTOMY  1989    Prior to Admission medications   Medication Sig Start Date End Date Taking? Authorizing Provider  albuterol (PROVENTIL HFA) 108 (90 Base) MCG/ACT inhaler Inhale 2 puffs into the lungs 2 (two) times daily as needed for shortness of breath.    Yes [provider]  amLODipine (NORVASC) 5 MG tablet Take 1 tablet (5 mg total) by mouth daily. 08/14/19 11/12/19 Yes Visser, Jacquelyn D, PA-C  ASPIRIN LOW DOSE 81 MG EC tablet TAKE 1 TABLET (81 MG TOTAL) BY MOUTH DAILY. 05/31/16  Yes Sowles, Drue Stager, MD  Cyanocobalamin (VITAMIN B12 PO) Take 1 tablet by mouth daily.    Yes [provider]  DULoxetine (CYMBALTA) 30 MG capsule Take 1 capsule (30 mg total) by mouth daily. 05/07/19  Yes Sowles, Drue Stager, MD  furosemide  (LASIX) 20 MG tablet Take 1 tablet by mouth daily. 06/30/19  Yes Sowles, Drue Stager, MD  Icosapent Ethyl (VASCEPA) 1 g CAPS Take 2 capsules (2 g total) by mouth 2 (two) times daily. 05/07/19  Yes Sowles, Drue Stager, MD  insulin NPH-regular Human (70-30) 100 UNIT/ML injection Inject 100 Units into the skin 2 (two) times daily with a meal.   Yes Lonia Farber, MD  irbesartan-hydrochlorothiazide (AVALIDE) 300-12.5 MG tablet Take 1 tablet by mouth daily. 03/07/19  Yes Sowles, Drue Stager, MD  levothyroxine (SYNTHROID) 25 MCG tablet Take 1 tablet by mouth daily except, take 2 tablets by mouth on Sunday 06/30/19  Yes Sowles, Drue Stager, MD  metFORMIN (GLUCOPHAGE-XR) 500 MG 24 hr tablet Take 500 mg by mouth. 2 tablets daily (total of 1000 mg daily) 12/03/17  Yes [provider]  metoprolol tartrate (LOPRESSOR) 25 MG tablet Take 1 tablet (25 mg total) by mouth 2 (two) times daily. 05/07/19  Yes Sowles, Drue Stager, MD  Misc Natural Products (OSTEO BI-FLEX ADV JOINT SHIELD PO) Take by mouth 2 (two) times daily.    Yes [provider]  montelukast (SINGULAIR) 10 MG tablet Take 1 tablet (10 mg total) by mouth at bedtime. 05/07/19  Yes Steele Sizer, MD  Omega-3 Fatty Acids (FISH OIL) 1000 MG CAPS Take 2,000 mg by mouth daily.    Yes [provider]  omeprazole (PRILOSEC) 40 MG capsule Take 1 capsule by mouth daily. 08/04/19  Yes Steele Sizer, MD  Potassium 99 MG TABS Take by mouth.   Yes [provider]  pregabalin (LYRICA) 300 MG capsule Take 300 mg by mouth 2 (two) times a day. 03/06/19  Yes [provider]  Probiotic Product (PROBIOTIC-10 PO) Take by mouth.   Yes [provider]  [place] 1ML/31G 31G X 5/16" 1 ML [place]  12/19/15  Yes [provider]  rosuvastatin (CRESTOR) 20 MG tablet Take 1 tablet (20 mg total) by mouth daily. 05/07/19  Yes Sowles, Drue Stager, MD  sertraline (ZOLOFT) 50 MG tablet Take 1 tablet by mouth daily. 08/04/19  Yes Sowles,  Drue Stager, MD  traZODone (DESYREL) 50 MG tablet Take 1 tablet (50 mg total) by mouth at bedtime as needed for sleep. 05/07/19  Yes Sowles, Drue Stager, MD  vitamin C (ASCORBIC ACID) 500 MG tablet Take 500 mg by mouth 2 (two) times daily.    Yes [provider]  VITAMIN D, CHOLECALCIFEROL, PO Take 1,000 Units by mouth daily.    Yes [provider]  ACCU-CHEK SOFTCLIX LANCETS lancets  08/26/17   [provider]  acetaminophen (TYLENOL 8 HOUR) 650 MG CR tablet Take 1 tablet (650 mg total) by mouth every 8 (eight) hours as needed for pain. 10/16/18   Steele Sizer, MD  Alcohol Swabs (B-D SINGLE USE SWABS REGULAR) PADS  01/04/18   [provider]  Blood Glucose Monitoring Suppl (ACCU-CHEK AVIVA PLUS) w/Device KIT  11/02/15   [provider]    Allergies as of 08/05/2019 - Review Complete 07/31/2019  Allergen Reaction Noted  . Codeine Other (See Comments) 02/02/2012  . Atorvastatin  04/17/2015  . Hydrocodone  12/16/2018  . Latex Rash 08/15/2013  . Zolpidem Other (See Comments) 10/06/2016    Family History  Problem Relation Age of Onset  . Aneurysm Mother   . Aortic aneurysm Mother   . Heart attack Maternal Grandfather   . Diabetes Maternal Grandfather     Social History   Socioeconomic History  . Marital status: Significant Other    Spouse name: Not on file  . Number of children: 1  . Years of education: Not on file  . Highest education level: High school graduate  Occupational History  . Occupation: retired  Scientific laboratory technician  . Financial resource strain: Not hard at all  . Food insecurity    Worry: Never true    Inability: Never true  . Transportation needs    Medical: No    Non-medical: No  Tobacco Use  . Smoking status: Never Smoker  . Smokeless tobacco: Never Used  Substance and Sexual Activity  . Alcohol use: No    Alcohol/week: 0.0 standard drinks    Frequency: Never  . Drug use: No  . Sexual activity: Yes    Partners: Male   Lifestyle  . Physical activity    Days per week: 0 days    Minutes per session: 0 min  . Stress: To some extent  Relationships  . Social connections    Talks on phone: More than three times a week    Gets together: Once a week    Attends religious service: Never    Active member of club or organization: No    Attends meetings of clubs or organizations: Never  Relationship status: Living with partner  . Intimate partner violence    Fear of current or ex partner: No    Emotionally abused: No    Physically abused: No    Forced sexual activity: No  Other Topics Concern  . Not on file  Social History Narrative   Son lives in [place] (retired from [employer]) and her granddaughter (20yr old) [place].    Review of Systems: See HPI, otherwise negative ROS  Physical Exam: BP (!) 171/65   Pulse (!) 112   Temp (!) 96.7 F (35.9 C) (Temporal)   Resp 16   Ht '5\' 2"'$  (1.575 m)   Wt 115.7 kg   SpO2 98%   BMI 46.64 kg/m  General:   Alert,  pleasant and cooperative in NAD Head:  Normocephalic and atraumatic. Neck:  Supple; no masses or thyromegaly. Lungs:  Clear throughout to auscultation, normal respiratory effort.    Heart:  +S1, +S2, Regular rate and rhythm, No edema. Abdomen:  Soft, nontender and nondistended. Normal bowel sounds, without guarding, and without rebound.   Neurologic:  Alert and  oriented x4;  grossly normal neurologically.  Impression/Plan: JFrancee Piccolois here for an colonoscopy to be performed for Screening colonoscopy average risk   Risks, benefits, limitations, and alternatives regarding  colonoscopy have been reviewed with the patient.  Questions have been answered.  All parties agreeable.   KJonathon Bellows MD  08/19/2019, 8:03 AM

## 2019-08-19 NOTE — Anesthesia Postprocedure Evaluation (Signed)
Anesthesia Post Note  Patient: Summer [name]  Procedure(s) Performed: COLONOSCOPY WITH PROPOFOL (N/A )  Patient location during evaluation: Endoscopy Anesthesia Type: General Level of consciousness: awake and alert and oriented Pain management: pain level controlled Vital Signs Assessment: post-procedure vital signs reviewed and stable Respiratory status: spontaneous breathing, nonlabored ventilation and respiratory function stable Cardiovascular status: blood pressure returned to baseline and stable Postop Assessment: no signs of nausea or vomiting Anesthetic complications: no     Last Vitals:  Vitals:   08/19/19 0853 08/19/19 0903  BP: 130/67 135/65  Pulse:    Resp:    Temp:    SpO2:      Last Pain:  Vitals:   08/19/19 0903  TempSrc:   PainSc: 0-No pain                 [name]

## 2019-08-19 NOTE — Anesthesia Post-op Follow-up Note (Signed)
Anesthesia QCDR form completed.        

## 2019-08-19 NOTE — Transfer of Care (Signed)
Immediate Anesthesia Transfer of Care Note  Patient: Summer [name]  Procedure(s) Performed: COLONOSCOPY WITH PROPOFOL (N/A )  Patient Location: Endoscopy Unit  Anesthesia Type:General  Level of Consciousness: sedated  Airway & Oxygen Therapy: Patient Spontanous Breathing and Patient connected to face mask oxygen  Post-op Assessment: Report given to RN and Post -op Vital signs reviewed and stable  Post vital signs: Reviewed and stable  Last Vitals:  Vitals Value Taken Time  BP 120/52 08/19/19 0834  Temp 36.2 C 08/19/19 0833  Pulse 74 08/19/19 0837  Resp 14 08/19/19 0837  SpO2 100 % 08/19/19 0837  Vitals shown include unvalidated device data.  Last Pain:  Vitals:   08/19/19 0833  TempSrc: Temporal  PainSc: Asleep         Complications: No apparent anesthesia complications

## 2019-08-20 ENCOUNTER — Encounter: Payer: Self-pay | Admitting: Gastroenterology

## 2019-08-21 LAB — SURGICAL PATHOLOGY

## 2019-08-26 DIAGNOSIS — I10 Essential (primary) hypertension: Secondary | ICD-10-CM | POA: Diagnosis not present

## 2019-08-26 DIAGNOSIS — Z794 Long term (current) use of insulin: Secondary | ICD-10-CM | POA: Diagnosis not present

## 2019-08-26 DIAGNOSIS — E1159 Type 2 diabetes mellitus with other circulatory complications: Secondary | ICD-10-CM | POA: Diagnosis not present

## 2019-08-26 DIAGNOSIS — E039 Hypothyroidism, unspecified: Secondary | ICD-10-CM | POA: Diagnosis not present

## 2019-08-26 DIAGNOSIS — E1142 Type 2 diabetes mellitus with diabetic polyneuropathy: Secondary | ICD-10-CM | POA: Diagnosis not present

## 2019-08-28 ENCOUNTER — Encounter: Payer: Self-pay | Admitting: Gastroenterology

## 2019-08-31 ENCOUNTER — Other Ambulatory Visit: Payer: Self-pay | Admitting: Family Medicine

## 2019-09-01 ENCOUNTER — Ambulatory Visit (INDEPENDENT_AMBULATORY_CARE_PROVIDER_SITE_OTHER): Payer: Medicare HMO | Admitting: Psychology

## 2019-09-01 DIAGNOSIS — F411 Generalized anxiety disorder: Secondary | ICD-10-CM

## 2019-09-02 ENCOUNTER — Other Ambulatory Visit: Payer: Self-pay

## 2019-09-02 ENCOUNTER — Ambulatory Visit (INDEPENDENT_AMBULATORY_CARE_PROVIDER_SITE_OTHER): Payer: Medicare HMO | Admitting: Family Medicine

## 2019-09-02 ENCOUNTER — Encounter: Payer: Self-pay | Admitting: Family Medicine

## 2019-09-02 DIAGNOSIS — G4733 Obstructive sleep apnea (adult) (pediatric): Secondary | ICD-10-CM

## 2019-09-02 DIAGNOSIS — E039 Hypothyroidism, unspecified: Secondary | ICD-10-CM | POA: Diagnosis not present

## 2019-09-02 DIAGNOSIS — J4520 Mild intermittent asthma, uncomplicated: Secondary | ICD-10-CM

## 2019-09-02 DIAGNOSIS — E785 Hyperlipidemia, unspecified: Secondary | ICD-10-CM

## 2019-09-02 DIAGNOSIS — K219 Gastro-esophageal reflux disease without esophagitis: Secondary | ICD-10-CM | POA: Diagnosis not present

## 2019-09-02 DIAGNOSIS — R0602 Shortness of breath: Secondary | ICD-10-CM | POA: Diagnosis not present

## 2019-09-02 DIAGNOSIS — E1169 Type 2 diabetes mellitus with other specified complication: Secondary | ICD-10-CM | POA: Diagnosis not present

## 2019-09-02 DIAGNOSIS — N1831 Chronic kidney disease, stage 3a: Secondary | ICD-10-CM | POA: Diagnosis not present

## 2019-09-02 DIAGNOSIS — J029 Acute pharyngitis, unspecified: Secondary | ICD-10-CM

## 2019-09-02 DIAGNOSIS — I10 Essential (primary) hypertension: Secondary | ICD-10-CM

## 2019-09-02 DIAGNOSIS — E119 Type 2 diabetes mellitus without complications: Secondary | ICD-10-CM

## 2019-09-02 NOTE — Progress Notes (Signed)
Name: Summer [name]   MRN: [id number]    DOB: 31-Mar-1951   Date:09/02/2019       Progress Note  Subjective  Chief Complaint  Chief Complaint  Patient presents with  . Follow-up    4 month follow up/ BS today was 114  . Sore Throat    Throat feels better today    I connected with  Summer [name]  on 09/02/19 at 10:40 AM EST by a video enabled telemedicine application and verified that I am speaking with the correct person using two identifiers.  I discussed the limitations of evaluation and management by telemedicine and the availability of in person appointments. The patient expressed understanding and agreed to proceed. Staff also discussed with the patient that there may be a patient responsible charge related to this service. Patient Location: at home  Provider Location: [facility]    HPI  Morbid Obesity: [name] has been very frustrated with her weight and is feeling hopeful that her bariatric surgery may finally happen. She is morbidly obese and has a lot of obese related co-morbidities including DM, HTN, decrease in exercise tolerance, OA , OSA. She has major depression and explained that she needs to continue counseling and follow up with psychiatrist for close monitoring after surgery, since she uses food as coping mechanism for stress. She also has a long history of SOB and decrease in exercise tolerance. She has been cleared by cardiologist and pulmonologist, A1C is at goal, had normal colonoscopy and is waiting to be cleared by psychiatrist to have surgery now    DMII: she is seeing Dr. Orbie [name] A1C in December was8.1% but during hospital stay in July it was down to 7.2%, and one week ago down to 6.7%, she is doing great .Sheis really trying to lose weight and wants to have bariatric surgery Sheistaking70/30 insulin 100 units BID, Metformin , and Lyrica  through medication assistance program.  She has polyphagia, polydipsia andnocturia, She has CKI  stage III - on ARB dyslipidemia, neuropathy , she has a history of foot ulcers at this time    CKIIII: she is under the care of [facility] and is getting monitored by Dr. Holley [name]   HTN:She has SOB with activity, no chest pain. She is now taking Norvasc 5 mg, Metoprolol 25 mg BID, she has some lower extremity edema.    Major Depression:  Phq9 isdown to 11she went to psychiatrist in the past but stopped because of cost, however now seeing Dr. Mallie [name] at [facility] to be able to have bariatric surgery. Marland Kitchen She went to [facility] back in 03/2019 with stroke symptoms , receive tPA but evaluation was negative, psychiatrist was consulted and she was placed on zoloft 50and lower dose of duloxetine 30 mg daily. Denies suicidal thoughts or ideation. She states she is getting hopeful about having bariatric surgery   Hyperlipidemia: continue crestor, no muscle aches, has joint aches from OA, . Triglycerideswasdown from 503 to 201 since started on Vascepa, HDL still low at 32 but LDL was okay at 80. Continue medication   Hypothyroidism: taking one daily and two on Sundays, no hair loss, no dry skin, no change in bowel movements. Last TSH was at goal on 05/07/2019   Sore throat: symptoms started with sore throat, dry cough and low grade fever 99.8 past couple of days, feeling better today. No fatigue or SOB, discussed COVID-19 testing but she refuses, explained importance of self quarantine   Patient Active Problem  List   Diagnosis Date Noted  . Preop cardiovascular exam 07/18/2019  . Major depressive disorder, recurrent episode, moderate ([place]) 04/04/2019  . Left arm weakness 04/03/2019  . (HFpEF) heart failure with preserved ejection fraction ([place]) 08/31/2017  . Moderate persistent asthma 08/31/2017  . Hyperlipidemia due to type 2 diabetes mellitus ([place]) 06/14/2017  . Dyslipidemia associated with type 2 diabetes mellitus ([place]) 08/18/2016  . Charcot foot due to diabetes mellitus ([place]) 02/22/2016   . Acquired abduction deformity of foot 07/12/2015  . Osteoarthritis of subtalar joint 07/12/2015  . Poorly controlled type 2 diabetes mellitus with neuropathy ([place]) 07/12/2015  . Arthritis of foot, degenerative 07/12/2015  . Carpal tunnel syndrome 04/17/2015  . Chronic constipation 04/17/2015  . Insomnia, persistent 04/17/2015  . Chronic kidney disease (CKD), stage III (moderate) 04/17/2015  . Decreased exercise tolerance 04/17/2015  . Diabetes mellitus with polyneuropathy ([place]) 04/17/2015  . Gastro-esophageal reflux disease without esophagitis 04/17/2015  . Bursitis, trochanteric 04/17/2015  . Cephalalgia 04/17/2015  . Benign hypertension 04/17/2015  . Adult hypothyroidism 04/17/2015  . Hearing loss 04/17/2015  . Chronic recurrent major depressive disorder ([place]) 04/17/2015  . Neurogenic claudication 04/17/2015  . Morbid obesity ([place]) 04/17/2015  . Hypo-ovarianism 04/17/2015  . Perennial allergic rhinitis with seasonal variation 04/17/2015  . Acne erythematosa 04/17/2015  . Dyskinesia, tardive 04/17/2015  . Memory loss 04/17/2015  . Impingement syndrome of shoulder 04/17/2015  . Dermatitis, stasis 04/17/2015  . Obstructive sleep apnea 05/14/2014  . Dyspnea 05/06/2014  . Hyperlipidemia 02/06/2012  . LBP (low back pain) 09/16/2008    Past Surgical History:  Procedure Laterality Date  . ANKLE SURGERY Left approx Jan 2018  . CATARACT EXTRACTION  01/2011   right  . COLONOSCOPY WITH PROPOFOL N/A 08/19/2019   Procedure: COLONOSCOPY WITH PROPOFOL;  Surgeon: Summer Bellows, MD;  Location: [facility] ENDOSCOPY;  Service: Gastroenterology;  Laterality: N/A;  . eye lid surgery  2013   bilateral  . FOOT SURGERY    . NECK SURGERY    . TUBAL LIGATION    . VAGINAL HYSTERECTOMY  1989    Family History  Problem Relation Age of Onset  . Aneurysm Mother   . Aortic aneurysm Mother   . Heart attack Maternal Grandfather   . Diabetes Maternal Grandfather      Current Outpatient Medications:  .   ACCU-CHEK SOFTCLIX LANCETS lancets, , Disp: , Rfl:  .  acetaminophen (TYLENOL 8 HOUR) 650 MG CR tablet, Take 1 tablet (650 mg total) by mouth every 8 (eight) hours as needed for pain., Disp: 90 tablet, Rfl: 2 .  albuterol (PROVENTIL HFA) 108 (90 Base) MCG/ACT inhaler, Inhale 2 puffs into the lungs 2 (two) times daily as needed for shortness of breath. , Disp: , Rfl:  .  Alcohol Swabs (B-D SINGLE USE SWABS REGULAR) PADS, , Disp: , Rfl:  .  amLODipine (NORVASC) 5 MG tablet, Take 1 tablet (5 mg total) by mouth daily., Disp: 90 tablet, Rfl: 2 .  ASPIRIN LOW DOSE 81 MG EC tablet, TAKE 1 TABLET (81 MG TOTAL) BY MOUTH DAILY., Disp: 30 tablet, Rfl: 0 .  Blood Glucose Monitoring Suppl (ACCU-CHEK AVIVA PLUS) w/Device KIT, , Disp: , Rfl:  .  Cyanocobalamin (VITAMIN B12 PO), Take 1 tablet by mouth daily. , Disp: , Rfl:  .  DULoxetine (CYMBALTA) 30 MG capsule, Take 1 capsule by mouth daily., Disp: 30 capsule, Rfl: 2 .  furosemide (LASIX) 20 MG tablet, Take 1 tablet by mouth daily., Disp: 90 tablet, Rfl: 0 .  Icosapent Ethyl (VASCEPA) 1 g CAPS, Take 2 capsules (2 g total) by mouth 2 (two) times daily., Disp: 360 capsule, Rfl: 1 .  irbesartan-hydrochlorothiazide (AVALIDE) 300-12.5 MG tablet, Take 1 tablet by mouth daily., Disp: 90 tablet, Rfl: 0 .  levothyroxine (SYNTHROID) 25 MCG tablet, Take 1 tablet by mouth daily except, take 2 tablets by mouth on Sunday, Disp: 100 tablet, Rfl: 0 .  metFORMIN (GLUCOPHAGE-XR) 500 MG 24 hr tablet, Take 500 mg by mouth. 2 tablets daily (total of 1000 mg daily), Disp: , Rfl:  .  metoprolol tartrate (LOPRESSOR) 25 MG tablet, Take 1 tablet (25 mg total) by mouth 2 (two) times daily., Disp: 180 tablet, Rfl: 1 .  Misc Natural Products (OSTEO BI-FLEX ADV JOINT SHIELD PO), Take by mouth 2 (two) times daily. , Disp: , Rfl:  .  montelukast (SINGULAIR) 10 MG tablet, Take 1 tablet (10 mg total) by mouth at bedtime., Disp: 90 tablet, Rfl: 1 .  Omega-3 Fatty Acids (FISH OIL) 1000 MG CAPS,  Take 2,000 mg by mouth daily. , Disp: , Rfl:  .  omeprazole (PRILOSEC) 40 MG capsule, Take 1 capsule by mouth daily., Disp: 90 capsule, Rfl: 0 .  Potassium 99 MG TABS, Take by mouth., Disp: , Rfl:  .  pregabalin (LYRICA) 300 MG capsule, Take 300 mg by mouth 2 (two) times a day., Disp: , Rfl:  .  Probiotic Product (PROBIOTIC-10 PO), Take by mouth., Disp: , Rfl:  .  RELION INSULIN SYRINGE 1ML/31G 31G X 5/16" 1 ML MISC, , Disp: , Rfl:  .  rosuvastatin (CRESTOR) 20 MG tablet, Take 1 tablet (20 mg total) by mouth daily., Disp: 90 tablet, Rfl: 1 .  sertraline (ZOLOFT) 50 MG tablet, Take 1 tablet by mouth daily., Disp: 90 tablet, Rfl: 0 .  traZODone (DESYREL) 50 MG tablet, Take 1 tablet (50 mg total) by mouth at bedtime as needed for sleep., Disp: 90 tablet, Rfl: 1 .  vitamin C (ASCORBIC ACID) 500 MG tablet, Take 500 mg by mouth 2 (two) times daily. , Disp: , Rfl:  .  VITAMIN D, CHOLECALCIFEROL, PO, Take 1,000 Units by mouth daily. , Disp: , Rfl:  .  insulin NPH-regular Human (70-30) 100 UNIT/ML injection, Inject 100 Units into the skin 2 (two) times daily with a meal., Disp: , Rfl:   Allergies  Allergen Reactions  . Codeine Other (See Comments)    "TRIPPED OUT"  DIDN'T LIKE THE [place]  . Atorvastatin     muscle pain  . Hydrocodone     itching  . Latex Rash  . Zolpidem Other (See Comments)    Sleep walk    I personally reviewed active problem list, medication list, allergies, family history, social history, health maintenance with the patient/caregiver today.   ROS  Ten systems reviewed and is negative except as mentioned in HPI   Objective  Virtual encounter, vitals obtained at home  Vitals:   09/02/19 1029  BP: 120/69  Pulse: 74  Temp: 98.9 F (37.2 C)  SpO2: 96%    Body mass index is 46.64 kg/m.  Physical Exam  Awake, alert and oriented  PHQ2/9: Depression screen [facility] 2/9 09/02/2019 07/31/2019 07/15/2019 05/07/2019 04/14/2019  Decreased Interest '1 3 1 3 3  '$ Down,  Depressed, Hopeless '1 3 1 2 2  '$ PHQ - 2 Score '2 6 2 5 5  '$ Altered sleeping 2 3 0 2 3  Tired, decreased energy '3 3 1 2 3  '$ Change in appetite 2 3  0 2 3  Feeling bad or failure about yourself  '2 3 3 2 2  '$ Trouble concentrating 0 0 0 2 2  Moving slowly or fidgety/restless 0 0 0 1 2  Suicidal thoughts 0 0 0 2 2  PHQ-9 Score '11 18 6 18 22  '$ Difficult doing work/chores Somewhat difficult Very difficult Somewhat difficult Somewhat difficult Very difficult  Some recent data might be hidden   PHQ-2/9 Result is positive.    Fall Risk: Fall Risk  09/02/2019 07/31/2019 05/07/2019 04/14/2019 03/20/2019  Falls in the past year? 0 0 0 0 0  Comment - - - - -  Number falls in past yr: 0 0 0 0 -  Injury with Fall? 0 0 0 0 -  Risk Factor Category  - - - - -  Comment - - - - -  Risk for fall due to : - - - - -  Risk for fall due to: Comment - - - - -  Follow up - Falls prevention discussed - - -    Assessment & Plan  1. Morbid (severe) obesity due to excess calories [facility])  Discussed with the patient the risk posed by an increased BMI. Discussed importance of portion control, calorie counting and at least 150 minutes of physical activity weekly. Avoid sweet beverages and drink more water. Eat at least 6 servings of fruit and vegetables daily   2. Stage 3a chronic kidney disease   3. Adult hypothyroidism  Continue medication   4. Dyslipidemia associated with type 2 diabetes mellitus ([place])  Continue medication  5. SOB (shortness of breath)   6. Benign hypertension  At goal   7. Obstructive sleep apnea  Continue CPAP   8. Mild intermittent asthma in adult without complication  Under the care of pulmonologist   9. GERD without esophagitis   10. Essential hypertension  Continue medications  11. Diabetes mellitus with coincident hypertension ([place])  At goal   I discussed the assessment and treatment plan with the patient. The patient was provided an opportunity to ask questions and  all were answered. The patient agreed with the plan and demonstrated an understanding of the instructions.  The patient was advised to call back or seek an in-person evaluation if the symptoms worsen or if the condition fails to improve as anticipated.  I provided 25  minutes of non-face-to-face time during this encounter.

## 2019-09-08 ENCOUNTER — Ambulatory Visit: Payer: Medicare HMO | Admitting: Family Medicine

## 2019-09-15 ENCOUNTER — Ambulatory Visit (INDEPENDENT_AMBULATORY_CARE_PROVIDER_SITE_OTHER): Payer: Medicare HMO | Admitting: Psychology

## 2019-09-15 DIAGNOSIS — F411 Generalized anxiety disorder: Secondary | ICD-10-CM | POA: Diagnosis not present

## 2019-09-15 DIAGNOSIS — F331 Major depressive disorder, recurrent, moderate: Secondary | ICD-10-CM

## 2019-09-19 ENCOUNTER — Ambulatory Visit
Admission: RE | Admit: 2019-09-19 | Discharge: 2019-09-19 | Disposition: A | Discharge status: Home / Self Care | Payer: Medicare HMO | Source: Ambulatory Visit | Attending: Family Medicine | Admitting: Family Medicine

## 2019-09-19 DIAGNOSIS — Z1231 Encounter for screening mammogram for malignant neoplasm of breast: Secondary | ICD-10-CM | POA: Diagnosis not present

## 2019-09-24 ENCOUNTER — Ambulatory Visit (INDEPENDENT_AMBULATORY_CARE_PROVIDER_SITE_OTHER): Payer: Medicare HMO | Admitting: Psychology

## 2019-09-24 DIAGNOSIS — F411 Generalized anxiety disorder: Secondary | ICD-10-CM

## 2019-09-24 DIAGNOSIS — F331 Major depressive disorder, recurrent, moderate: Secondary | ICD-10-CM

## 2019-09-25 ENCOUNTER — Telehealth: Payer: Self-pay | Admitting: Family Medicine

## 2019-09-25 NOTE — Telephone Encounter (Signed)
Pt stated her therapist thinks her sertraline (ZOLOFT) 50 MG tablet  Needs to be increased from 50MG  to a higher dosage. Please advise.

## 2019-09-28 ENCOUNTER — Other Ambulatory Visit: Payer: Self-pay | Admitting: Family Medicine

## 2019-09-28 DIAGNOSIS — E039 Hypothyroidism, unspecified: Secondary | ICD-10-CM

## 2019-10-01 NOTE — Telephone Encounter (Signed)
Patient notified Dr. Ancil Boozer will sent in a increased dosage of Zoloft until she can be seen by psychiatrist. Please send increased dosage to [place] by [facility] and she will let us know when her appointment is made.

## 2019-10-01 NOTE — Telephone Encounter (Signed)
Spoke with patient and she is seeing a therapist and he recommend for patient to be increase on her Zoloft dosage. If Dr. Ancil Boozer did not fill comfortable increasing it, he could ask one of the psychiatrist there. She is seeing Mr.Barker at [facility] in [place].

## 2019-10-01 NOTE — Telephone Encounter (Unsigned)
Copied from [place] [id number]. Topic: General - Other >> Sep 30, 2019 11:47 AM Rainey Pines A wrote: Patient would like a callback from [facility] in regards to her medication dosage increasing today .Please advise

## 2019-10-02 ENCOUNTER — Ambulatory Visit (INDEPENDENT_AMBULATORY_CARE_PROVIDER_SITE_OTHER): Payer: Medicare HMO | Admitting: Psychology

## 2019-10-02 ENCOUNTER — Ambulatory Visit: Payer: Medicare HMO | Admitting: Family Medicine

## 2019-10-02 DIAGNOSIS — F411 Generalized anxiety disorder: Secondary | ICD-10-CM

## 2019-10-02 DIAGNOSIS — F331 Major depressive disorder, recurrent, moderate: Secondary | ICD-10-CM

## 2019-10-03 ENCOUNTER — Other Ambulatory Visit: Payer: Self-pay | Admitting: Family Medicine

## 2019-10-03 DIAGNOSIS — F99 Mental disorder, not otherwise specified: Secondary | ICD-10-CM

## 2019-10-03 DIAGNOSIS — F5105 Insomnia due to other mental disorder: Secondary | ICD-10-CM

## 2019-10-06 NOTE — Telephone Encounter (Signed)
Patient notified Dr. Ancil Boozer would like to get appointment details so she will know how much to send her until she is able to see psychiatrist. Patient notified and states she would not be able to get to anyone there until Thursday. But will call us once she has an appointment.

## 2019-10-09 ENCOUNTER — Ambulatory Visit (INDEPENDENT_AMBULATORY_CARE_PROVIDER_SITE_OTHER): Payer: Medicare HMO | Admitting: Psychology

## 2019-10-09 DIAGNOSIS — F331 Major depressive disorder, recurrent, moderate: Secondary | ICD-10-CM

## 2019-10-09 DIAGNOSIS — F411 Generalized anxiety disorder: Secondary | ICD-10-CM

## 2019-10-14 ENCOUNTER — Ambulatory Visit (INDEPENDENT_AMBULATORY_CARE_PROVIDER_SITE_OTHER): Payer: Medicare HMO | Admitting: Family Medicine

## 2019-10-14 ENCOUNTER — Ambulatory Visit
Admission: RE | Admit: 2019-10-14 | Discharge: 2019-10-14 | Disposition: A | Discharge status: Home / Self Care | Payer: Medicare HMO | Source: Ambulatory Visit | Attending: Family Medicine | Admitting: Family Medicine

## 2019-10-14 ENCOUNTER — Other Ambulatory Visit: Payer: Self-pay

## 2019-10-14 ENCOUNTER — Encounter: Payer: Self-pay | Admitting: Family Medicine

## 2019-10-14 VITALS — BP 130/50 | HR 74 | Temp 96.9°F | Resp 16 | Ht 62.0 in | Wt 259.8 lb

## 2019-10-14 DIAGNOSIS — R109 Unspecified abdominal pain: Secondary | ICD-10-CM | POA: Insufficient documentation

## 2019-10-14 DIAGNOSIS — R1111 Vomiting without nausea: Secondary | ICD-10-CM | POA: Diagnosis not present

## 2019-10-14 DIAGNOSIS — R1031 Right lower quadrant pain: Secondary | ICD-10-CM | POA: Diagnosis not present

## 2019-10-14 MED ORDER — TRAMADOL HCL 50 MG PO TABS: 50.0000 mg | ORAL_TABLET | Freq: Three times a day (TID) | ORAL | 0 refills | Status: AC | PRN

## 2019-10-14 NOTE — Progress Notes (Signed)
Patient ID: Summer [name], female    DOB: Sep 09, 1951, 69 y.o.   MRN: [id number]  PCP: Steele Sizer, MD  Chief Complaint  Patient presents with  . Pain    Right side near hip area. Pain since a week before Christmas    Subjective:   Summer [name] is a 69 y.o. female, presents to clinic with CC of the following:    Abdominal Pain This is a new problem. Episode onset: over a month (6 weeks) The onset quality is sudden. The problem occurs constantly. The problem has been gradually worsening. The pain is located in the RLQ and right flank. The pain is at a severity of 10/10. The pain is severe. The quality of the pain is burning and sharp. Associated symptoms include belching and vomiting (threw up this morning breakfast). Pertinent negatives include no arthralgias, constipation, diarrhea, dysuria, fever, flatus, frequency, hematochezia, hematuria, melena, myalgias, nausea or weight loss. The pain is aggravated by movement and certain positions. Relieved by: layuing down, taking pressure off right lower side and flank. She has tried acetaminophen for the symptoms. The treatment provided mild relief. Her past medical history is significant for abdominal surgery and GERD. There is no history of colon cancer, Crohn's disease, gallstones, irritable bowel syndrome, pancreatitis, PUD or ulcerative colitis.   Hx appendectomy, hernia repair to right groin?  S/p TAH-left oophrectomy, she believes she has her right ovary and she wonders if she has a cyst on it Hx of kidney stones -pain does feel somewhat like kidney stone especially how it wraps around her right hip is moved down towards her pelvis but she has not had any pain in her right upper flank.  She believes the pain had somewhat of a sudden onset but she also said that it gradually worsened from very mild during Christmas time to now extremely severe and has been very constant No pain with movement of right leg or hip No rash to skin No change  with eating or drinking different kinds of foods.  She denies any urinary symptoms odor, hematuria     Patient Active Problem List   Diagnosis Date Noted  . Preop cardiovascular exam 07/18/2019  . Major depressive disorder, recurrent episode, moderate ([place]) 04/04/2019  . Left arm weakness 04/03/2019  . (HFpEF) heart failure with preserved ejection fraction ([place]) 08/31/2017  . Moderate persistent asthma 08/31/2017  . Hyperlipidemia due to type 2 diabetes mellitus ([place]) 06/14/2017  . Dyslipidemia associated with type 2 diabetes mellitus ([place]) 08/18/2016  . Charcot foot due to diabetes mellitus ([place]) 02/22/2016  . Acquired abduction deformity of foot 07/12/2015  . Osteoarthritis of subtalar joint 07/12/2015  . Poorly controlled type 2 diabetes mellitus with neuropathy ([place]) 07/12/2015  . Arthritis of foot, degenerative 07/12/2015  . Carpal tunnel syndrome 04/17/2015  . Chronic constipation 04/17/2015  . Insomnia, persistent 04/17/2015  . Chronic kidney disease (CKD), stage III (moderate) 04/17/2015  . Decreased exercise tolerance 04/17/2015  . Diabetes mellitus with polyneuropathy ([place]) 04/17/2015  . Gastro-esophageal reflux disease without esophagitis 04/17/2015  . Bursitis, trochanteric 04/17/2015  . Cephalalgia 04/17/2015  . Benign hypertension 04/17/2015  . Adult hypothyroidism 04/17/2015  . Hearing loss 04/17/2015  . Chronic recurrent major depressive disorder ([place]) 04/17/2015  . Neurogenic claudication 04/17/2015  . Morbid obesity ([place]) 04/17/2015  . Hypo-ovarianism 04/17/2015  . Perennial allergic rhinitis with seasonal variation 04/17/2015  . Acne erythematosa 04/17/2015  . Dyskinesia, tardive 04/17/2015  . Memory loss 04/17/2015  . Impingement syndrome  of shoulder 04/17/2015  . Dermatitis, stasis 04/17/2015  . Obstructive sleep apnea 05/14/2014  . Dyspnea 05/06/2014  . Hyperlipidemia 02/06/2012  . LBP (low back pain) 09/16/2008      Current Outpatient  Medications:  .  ACCU-CHEK SOFTCLIX LANCETS lancets, , Disp: , Rfl:  .  acetaminophen (TYLENOL 8 HOUR) 650 MG CR tablet, Take 1 tablet (650 mg total) by mouth every 8 (eight) hours as needed for pain., Disp: 90 tablet, Rfl: 2 .  albuterol (PROVENTIL HFA) 108 (90 Base) MCG/ACT inhaler, Inhale 2 puffs into the lungs 2 (two) times daily as needed for shortness of breath. , Disp: , Rfl:  .  Alcohol Swabs (B-D SINGLE USE SWABS REGULAR) PADS, , Disp: , Rfl:  .  amLODipine (NORVASC) 5 MG tablet, Take 1 tablet (5 mg total) by mouth daily., Disp: 90 tablet, Rfl: 2 .  ASPIRIN LOW DOSE 81 MG EC tablet, TAKE 1 TABLET (81 MG TOTAL) BY MOUTH DAILY., Disp: 30 tablet, Rfl: 0 .  Blood Glucose Monitoring Suppl (ACCU-CHEK AVIVA PLUS) w/Device KIT, , Disp: , Rfl:  .  Cyanocobalamin (VITAMIN B12 PO), Take 1 tablet by mouth daily. , Disp: , Rfl:  .  DULoxetine (CYMBALTA) 30 MG capsule, Take 1 capsule by mouth daily., Disp: 30 capsule, Rfl: 2 .  furosemide (LASIX) 20 MG tablet, Take 1 tablet by mouth daily., Disp: 90 tablet, Rfl: 0 .  Icosapent Ethyl (VASCEPA) 1 g CAPS, Take 2 capsules (2 g total) by mouth 2 (two) times daily., Disp: 360 capsule, Rfl: 1 .  insulin NPH-regular Human (70-30) 100 UNIT/ML injection, Inject 100 Units into the skin 2 (two) times daily with a meal., Disp: , Rfl:  .  irbesartan-hydrochlorothiazide (AVALIDE) 300-12.5 MG tablet, Take 1 tablet by mouth daily., Disp: 90 tablet, Rfl: 0 .  levothyroxine (SYNTHROID) 25 MCG tablet, Take 1 tablet by mouth daily except, take 2 tablets by mouth on Sunday, Disp: 100 tablet, Rfl: 0 .  metFORMIN (GLUCOPHAGE-XR) 500 MG 24 hr tablet, Take 500 mg by mouth. 2 tablets daily (total of 1000 mg daily), Disp: , Rfl:  .  metoprolol tartrate (LOPRESSOR) 25 MG tablet, Take 1 tablet (25 mg total) by mouth 2 (two) times daily., Disp: 180 tablet, Rfl: 1 .  Misc Natural Products (OSTEO BI-FLEX ADV JOINT SHIELD PO), Take by mouth 2 (two) times daily. , Disp: , Rfl:  .   montelukast (SINGULAIR) 10 MG tablet, Take 1 tablet (10 mg total) by mouth at bedtime., Disp: 90 tablet, Rfl: 1 .  Omega-3 Fatty Acids (FISH OIL) 1000 MG CAPS, Take 2,000 mg by mouth daily. , Disp: , Rfl:  .  omeprazole (PRILOSEC) 40 MG capsule, Take 1 capsule by mouth daily., Disp: 90 capsule, Rfl: 0 .  Potassium 99 MG TABS, Take by mouth., Disp: , Rfl:  .  pregabalin (LYRICA) 300 MG capsule, Take 300 mg by mouth 2 (two) times a day., Disp: , Rfl:  .  Probiotic Product (PROBIOTIC-10 PO), Take by mouth., Disp: , Rfl:  .  RELION INSULIN SYRINGE 1ML/31G 31G X 5/16" 1 ML MISC, , Disp: , Rfl:  .  rosuvastatin (CRESTOR) 20 MG tablet, Take 1 tablet (20 mg total) by mouth daily., Disp: 90 tablet, Rfl: 1 .  sertraline (ZOLOFT) 50 MG tablet, Take 1 tablet by mouth daily., Disp: 90 tablet, Rfl: 0 .  traZODone (DESYREL) 100 MG tablet, TAKE 1/2 (ONE-HALF) TABLET BY MOUTH NIGHTLY AT BEDTIME AS NEEDED FOR SLEEP, Disp: 30 tablet, Rfl: 0 .  traZODone (DESYREL) 50 MG tablet, Take 1 tablet (50 mg total) by mouth at bedtime as needed for sleep., Disp: 90 tablet, Rfl: 1 .  vitamin C (ASCORBIC ACID) 500 MG tablet, Take 500 mg by mouth 2 (two) times daily. , Disp: , Rfl:  .  VITAMIN D, CHOLECALCIFEROL, PO, Take 1,000 Units by mouth daily. , Disp: , Rfl:    Allergies  Allergen Reactions  . Codeine Other (See Comments)    "TRIPPED OUT"  DIDN'T LIKE THE [place]  . Atorvastatin     muscle pain  . Hydrocodone     itching  . Latex Rash  . Zolpidem Other (See Comments)    Sleep walk     Family History  Problem Relation Age of Onset  . Aneurysm Mother   . Aortic aneurysm Mother   . Heart attack Maternal Grandfather   . Diabetes Maternal Grandfather      Social History   Socioeconomic History  . Marital status: Significant Other    Spouse name: Not on file  . Number of children: 1  . Years of education: Not on file  . Highest education level: High school graduate  Occupational History  .  Occupation: retired  Tobacco Use  . Smoking status: Never Smoker  . Smokeless tobacco: Never Used  Substance and Sexual Activity  . Alcohol use: No    Alcohol/week: 0.0 standard drinks  . Drug use: No  . Sexual activity: Yes    Partners: Male  Other Topics Concern  . Not on file  Social History Narrative   Son lives in [place] (retired from [employer]) and her granddaughter (18yr old[place]   Social Determinants of Health   Financial Resource Strain:   . Difficulty of Paying Living Expenses: Not on file  Food Insecurity:   . Worried About RCharity fundraiserin the Last Year: Not on file  . Ran Out of Food in the Last Year: Not on file  Transportation Needs:   . Lack of Transportation (Medical): Not on file  . Lack of Transportation (Non-Medical): Not on file  Physical Activity:   . Days of Exercise per Week: Not on file  . Minutes of Exercise per Session: Not on file  Stress: Stress Concern Present  . Feeling of Stress : To some extent  Social Connections:   . Frequency of Communication with Friends and Family: Not on file  . Frequency of Social Gatherings with Friends and Family: Not on file  . Attends Religious Services: Not on file  . Active Member of Clubs or Organizations: Not on file  . Attends CArchivistMeetings: Not on file  . Marital Status: Not on file  Intimate Partner Violence:   . Fear of Current or Ex-Partner: Not on file  . Emotionally Abused: Not on file  . Physically Abused: Not on file  . Sexually Abused: Not on file    Chart Review Today: I personally reviewed active problem list, medication list, allergies, family history, social history, health maintenance, notes from last encounter, lab results, imaging with the patient/caregiver today.   Review of Systems  Constitutional: Negative.  Negative for fever and weight loss.  HENT: Negative.   Eyes: Negative.   Respiratory: Negative.   Cardiovascular:  Negative.   Gastrointestinal: Positive for abdominal pain and vomiting (threw up this morning breakfast). Negative for constipation, diarrhea, flatus, hematochezia, melena and nausea.  Endocrine: Negative.   Genitourinary: Negative.  Negative for dysuria, frequency and hematuria.  Musculoskeletal: Negative.  Negative for arthralgias and myalgias.  Skin: Negative.   Allergic/Immunologic: Negative.   Neurological: Negative.   Hematological: Negative.   Psychiatric/Behavioral: Negative.   All other systems reviewed and are negative.      Objective:   Vitals:   10/14/19 1038  BP: (!) 130/50  Pulse: 74  Resp: 16  Temp: (!) 96.9 F (36.1 C)  SpO2: 98%  Weight: 259 lb 12.8 oz (117.8 kg)  Height: '5\' 2"'$  (1.575 m)    Body mass index is 47.52 kg/m.  Physical Exam Vitals and nursing note reviewed.  Constitutional:      General: She is not in acute distress.    Appearance: She is well-developed. She is obese. She is not ill-appearing, toxic-appearing or diaphoretic.     Interventions: Face mask in place.     Comments: Chronically ill and uncomfortable appearing, non-toxic  HENT:     Head: Normocephalic and atraumatic.     Right Ear: External ear normal.     Left Ear: External ear normal.  Eyes:     General: Lids are normal. No scleral icterus.       Right eye: No discharge.        Left eye: No discharge.     Conjunctiva/sclera: Conjunctivae normal.  Neck:     Trachea: Phonation normal. No tracheal deviation.  Cardiovascular:     Rate and Rhythm: Normal rate and regular rhythm.     Pulses: Normal pulses.          Radial pulses are 2+ on the right side and 2+ on the left side.       Posterior tibial pulses are 2+ on the right side and 2+ on the left side.     Heart sounds: Normal heart sounds. No murmur. No friction rub. No gallop.   Pulmonary:     Effort: Pulmonary effort is normal. Tachypnea present. No accessory muscle usage or respiratory distress.     Breath sounds:  Normal breath sounds. No stridor. No wheezing, rhonchi or rales.     Comments: Pt out of breath (appearing deconditioned not distressed) with getting up and down off exam table with assitance Chest:     Chest wall: No tenderness.  Abdominal:     General: Abdomen is protuberant. Bowel sounds are normal. There is no distension.     Palpations: Abdomen is soft.     Tenderness: There is abdominal tenderness in the right lower quadrant. There is guarding (voluntary). There is no right CVA tenderness, left CVA tenderness or rebound. Positive signs include McBurney's sign. Negative signs include Murphy's sign, Rovsing's sign, psoas sign and obturator sign.     Comments: Obese abdomen - exam somewhat limited by body habitus RLQ to right side ttp with deep palpation and also with lighter palpation or manipulation of panus, no abdominal wall edema, induration erythema No right inguinal or femoral hernia appreciated  Musculoskeletal:        General: No deformity. Normal range of motion.     Cervical back: Normal range of motion and neck supple.     Right lower leg: No edema.     Left lower leg: No edema.  Lymphadenopathy:     Cervical: No cervical adenopathy.  Skin:    General: Skin is warm and dry.     Capillary Refill: Capillary refill takes less than 2 seconds.     Coloration: Skin is not jaundiced or pale.  Findings: No rash.     Comments: Skin fold of panus and groin creases inspected - no rash   Neurological:     Mental Status: She is alert and oriented to person, place, and time.     Gait: Gait abnormal.  Psychiatric:        Attention and Perception: Attention normal.        Behavior: Behavior is cooperative.      Results for orders placed or performed during the hospital encounter of 08/19/19  Glucose, capillary  Result Value Ref Range   Glucose-Capillary 117 (H) 70 - 99 mg/dL  Surgical pathology  Result Value Ref Range   SURGICAL PATHOLOGY      SURGICAL PATHOLOGY CASE:  [id number] PATIENT: Lyda Perone Surgical Pathology Report     Specimen Submitted: A. Colon polyp, sigmoid; cbx  Clinical History: Screening colonoscopy. Colon polyp.      DIAGNOSIS: A.  COLON POLYP, SIGMOID; COLD BIOPSY: - COLONIC MUCOSA WITH SMALL, BLAND INTRAMUCOSAL SPINDLE CELL PROLIFERATION CONSISTENT WITH LEIOMYOMA, BENIGN. - NEGATIVE FOR DYSPLASIA AND MALIGNANCY.  Comment: A limited panel of immunohistochemical stains was performed to further characterize the small bland spindle cell proliferation.  The spindle cells are positive with smooth muscle actin and negative with S100 and NSE.  This pattern of immunoreactivity supports the above diagnosis  IHC slides were prepared by [facility] for Molecular Biology and Pathology, [place], [place]. All controls stained appropriately.  This test was developed and its performance characteristics determined by [facility]. It has not been cleared or approved by the [place] Food and Drug Adminis tration. The FDA does not require this test to go through premarket FDA review. This test is used for clinical purposes. It should not be regarded as investigational or for research. This laboratory is certified under the Clinical Laboratory Improvement Amendments (CLIA) as qualified to perform high complexity clinical laboratory testing.   GROSS DESCRIPTION: A. Labeled: C BX polyp sigmoid colon Received: In formalin Tissue fragment(s): 2 Size: 0.1 and 0.2 cm Description: Tan soft tissue fragments Entirely submitted in 1 cassette.    Final Diagnosis performed by Quay Burow, MD.   Electronically signed 08/21/2019 1:48:24PM The electronic signature indicates that the named Attending Pathologist has evaluated the specimen Technical component performed at [facility], [address] [place], [place] [address] Lab: [phone] Dir: Rush Farmer, MD, MMM  Professional component performed at [facility], [facility], [place], [place],   [address] Lab: [phone] Dir: Dellia Nims. Reuel Derby, MD         Assessment & Plan:    1. Abdominal pain, unspecified abdominal location  - CBC with Differential/Platelet - COMPLETE METABOLIC PANEL WITH GFR - Lipase - Urinalysis, Routine w reflex microscopic - Urine Culture - CT Abdomen Pelvis Wo Contrast  2. Right flank pain  - CBC with Differential/Platelet - COMPLETE METABOLIC PANEL WITH GFR - Urinalysis, Routine w reflex microscopic - Urine Culture - CT Abdomen Pelvis Wo Contrast  3. Non-intractable vomiting without nausea, unspecified vomiting type  - CBC with Differential/Platelet - COMPLETE METABOLIC PANEL WITH GFR - Lipase - Urinalysis, Routine w reflex microscopic - Urine Culture - CT Abdomen Pelvis Wo Contrast    Possibly nephrolithiasis, pylonephritis, hernia?, colitis No urine sample yet in clinic to assess, she denies urinary sx, but has hx of kidney stone Very focal ttp to RLQ and right side with voluntary guarding no rebound tenderness no CVA tenderness (makes pylo less likely) Hx of 2 abd surgeries, hernia repair - located  to same side - oral contrast to assess bowels No diarrhea, fever chills, sweats melena hematochezia She vomited this morning no N, or other sx, may be sign of worsening? For age feel CT is appropraite, pt wishes to stay out of the ER if at all possibly.   Stat CT obtained - radiology called with results, reviewed and copied below:  CLINICAL DATA:  Acute right lower quadrant abdominal pain.  EXAM: CT ABDOMEN AND PELVIS WITHOUT CONTRAST  TECHNIQUE: Multidetector CT imaging of the abdomen and pelvis was performed following the standard protocol without IV contrast.  COMPARISON:  None.  FINDINGS: Lower chest: No acute abnormality.  Hepatobiliary: No focal liver abnormality is seen. No gallstones, gallbladder wall thickening, or biliary dilatation.  Pancreas: Unremarkable. No pancreatic ductal  dilatation or surrounding inflammatory changes.  Spleen: Normal in size without focal abnormality.  Adrenals/Urinary Tract: Adrenal glands are unremarkable. Kidneys are normal, without renal calculi, focal lesion, or hydronephrosis. Bladder is unremarkable.  Stomach/Bowel: Stomach appears normal. No evidence of bowel obstruction or inflammation is noted. The appendix is not visualized, but no definite inflammation is noted in the right lower quadrant.  Vascular/Lymphatic: Aortic atherosclerosis. No enlarged abdominal or pelvic lymph nodes.  Reproductive: Status post hysterectomy. No adnexal masses.  Other: No abdominal wall hernia or abnormality. No abdominopelvic ascites.  Musculoskeletal: Severe multilevel degenerative disc disease is noted in the lumbar spine. No acute abnormality is noted.  IMPRESSION: Aortic atherosclerosis. No acute abnormality seen in the abdomen or pelvis. Severe multilevel degenerative disc disease is noted in the lumbar spine.  Aortic Atherosclerosis (ICD10-I70.0).   Electronically Signed   By: Marijo Conception M.D.   On: 10/14/2019 15:23  No CT findings to explain sx.    Did not get review a urinalysis today she urinated upon arriving to the clinic and a sample was not obtained and then she was only produced a small amount of urine which went to the lab and could not be dipped, so pending urine results UTI and [place] are still in the differential Also possibility that she may have shingles with pain in unilateral distribution but her symptoms have been ongoing for more than a month and I did not see any rash nor did she report any prior rash so this is also slightly unlikely but encouraged her to watch for any skin changes  Sent in some antiemetics and pain medication, explained to her over the phone today the CT results and still unclear etiology of her pain.    Delsa Grana, PA-C 10/14/19 11:24 AM

## 2019-10-15 ENCOUNTER — Telehealth: Payer: Self-pay

## 2019-10-15 DIAGNOSIS — N1832 Chronic kidney disease, stage 3b: Secondary | ICD-10-CM

## 2019-10-15 DIAGNOSIS — E875 Hyperkalemia: Secondary | ICD-10-CM

## 2019-10-15 DIAGNOSIS — R799 Abnormal finding of blood chemistry, unspecified: Secondary | ICD-10-CM

## 2019-10-15 DIAGNOSIS — R109 Unspecified abdominal pain: Secondary | ICD-10-CM

## 2019-10-15 DIAGNOSIS — R748 Abnormal levels of other serum enzymes: Secondary | ICD-10-CM

## 2019-10-15 NOTE — Telephone Encounter (Signed)
-----   Message from Delsa Grana, [place] sent at 10/15/2019 12:46 PM EST ----- Patient's blood work is resulted  She continues to have a little bit of anemia no dramatic drop in her hemoglobin -we do still want to check the stool cards Her kidney function is at her baseline but I do think she was a little dehydrated (BUN increased) her potassium was a little high -have her hold her potassium supplement for about 3 days.  Her lipase is elevated which can be a sign of pancreatitis -that usually causes abdominal pain but it is more commonly found to the left abdomen or flank.  It is also digestive enzymes that might be elevated from other GI problems  Her urine was negative and completely normal, but the urine culture is still pending  My plan for her for right now would be to push hydration, only small bites of easy to digest food for the next several days, hold her potassium for about 3 days and I would like her to come back in for recheck on Friday of her kidney function potassium and recheck her abdomen.  Because the labs and her CT don't quite fit with her pain and presentation, I am going to see if her PCP will take a look and we'll call pt if Dr. Ancil Boozer would like to change the plan or if she has other dx or concerns.

## 2019-10-15 NOTE — Telephone Encounter (Signed)
Copied from [place] [phone]. Topic: General - Inquiry >> Oct 15, 2019 11:02 AM Reyne Dumas L wrote: Reason for CRM:  Pt calling to get lab results from yesterday.

## 2019-10-15 NOTE — Progress Notes (Signed)
Patient has been notified of lab results, recommendations and what is still pending. She states she is in pain and the medication given is not helping. Patient is to follow up on Friday and office has requested note- to ask PCP where to schedule her.

## 2019-10-16 ENCOUNTER — Ambulatory Visit: Payer: Medicare HMO | Admitting: Psychology

## 2019-10-16 LAB — URINALYSIS, ROUTINE W REFLEX MICROSCOPIC
Bilirubin Urine: NEGATIVE
Glucose, UA: NEGATIVE
Hgb urine dipstick: NEGATIVE
Ketones, ur: NEGATIVE
Leukocytes,Ua: NEGATIVE
Nitrite: NEGATIVE
Protein, ur: NEGATIVE
Specific Gravity, Urine: 1.009 (ref 1.001–1.03)
pH: 6 (ref 5.0–8.0)

## 2019-10-16 LAB — CBC WITH DIFFERENTIAL/PLATELET
Absolute Monocytes: 1006 cells/uL — ABNORMAL HIGH (ref 200–950)
Basophils Absolute: 103 cells/uL (ref 0–200)
Basophils Relative: 1.2 %
Eosinophils Absolute: 396 cells/uL (ref 15–500)
Eosinophils Relative: 4.6 %
HCT: 33.7 % — ABNORMAL LOW (ref 35.0–45.0)
Hemoglobin: 11.4 g/dL — ABNORMAL LOW (ref 11.7–15.5)
Lymphs Abs: 1832 cells/uL (ref 850–3900)
MCH: 29 pg (ref 27.0–33.0)
MCHC: 33.8 g/dL (ref 32.0–36.0)
MCV: 85.8 fL (ref 80.0–100.0)
MPV: 11.8 fL (ref 7.5–12.5)
Monocytes Relative: 11.7 %
Neutro Abs: 5263 cells/uL (ref 1500–7800)
Neutrophils Relative %: 61.2 %
Platelets: 238 10*3/uL (ref 140–400)
RBC: 3.93 10*6/uL (ref 3.80–5.10)
RDW: 15 % (ref 11.0–15.0)
Total Lymphocyte: 21.3 %
WBC: 8.6 10*3/uL (ref 3.8–10.8)

## 2019-10-16 LAB — COMPLETE METABOLIC PANEL WITH GFR
AG Ratio: 2 (calc) (ref 1.0–2.5)
ALT: 20 U/L (ref 6–29)
AST: 21 U/L (ref 10–35)
Albumin: 4.5 g/dL (ref 3.6–5.1)
Alkaline phosphatase (APISO): 66 U/L (ref 37–153)
BUN/Creatinine Ratio: 45 (calc) — ABNORMAL HIGH (ref 6–22)
BUN: 64 mg/dL — ABNORMAL HIGH (ref 7–25)
CO2: 29 mmol/L (ref 20–32)
Calcium: 10.1 mg/dL (ref 8.6–10.4)
Chloride: 102 mmol/L (ref 98–110)
Creat: 1.42 mg/dL — ABNORMAL HIGH (ref 0.60–0.93)
GFR, Est African American: 43 mL/min/{1.73_m2} — ABNORMAL LOW (ref >=60)
GFR, Est Non African American: 37 mL/min/{1.73_m2} — ABNORMAL LOW (ref >=60)
Globulin: 2.3 g/dL (calc) (ref 1.9–3.7)
Glucose, Bld: 135 mg/dL — ABNORMAL HIGH (ref 65–99)
Potassium: 5.5 mmol/L — ABNORMAL HIGH (ref 3.5–5.3)
Sodium: 141 mmol/L (ref 135–146)
Total Bilirubin: 0.4 mg/dL (ref 0.2–1.2)
Total Protein: 6.8 g/dL (ref 6.1–8.1)

## 2019-10-16 LAB — URINE CULTURE
MICRO NUMBER:: 10107714
Result:: NO GROWTH
SPECIMEN QUALITY:: ADEQUATE

## 2019-10-16 LAB — LIPASE: Lipase: 132 U/L — ABNORMAL HIGH (ref 7–60)

## 2019-10-17 ENCOUNTER — Encounter: Payer: Self-pay | Admitting: Family Medicine

## 2019-10-17 ENCOUNTER — Other Ambulatory Visit
Admission: RE | Admit: 2019-10-17 | Discharge: 2019-10-17 | Disposition: A | Discharge status: Home / Self Care | Payer: Medicare HMO | Source: Ambulatory Visit | Attending: Family Medicine | Admitting: Family Medicine

## 2019-10-17 ENCOUNTER — Other Ambulatory Visit: Payer: Self-pay

## 2019-10-17 ENCOUNTER — Ambulatory Visit (INDEPENDENT_AMBULATORY_CARE_PROVIDER_SITE_OTHER): Payer: Medicare HMO | Admitting: Family Medicine

## 2019-10-17 VITALS — BP 136/58 | HR 87 | Temp 97.8°F | Resp 14 | Ht 62.0 in | Wt 260.2 lb

## 2019-10-17 DIAGNOSIS — N1832 Chronic kidney disease, stage 3b: Secondary | ICD-10-CM | POA: Diagnosis not present

## 2019-10-17 DIAGNOSIS — R799 Abnormal finding of blood chemistry, unspecified: Secondary | ICD-10-CM

## 2019-10-17 DIAGNOSIS — R748 Abnormal levels of other serum enzymes: Secondary | ICD-10-CM | POA: Diagnosis not present

## 2019-10-17 DIAGNOSIS — R109 Unspecified abdominal pain: Secondary | ICD-10-CM

## 2019-10-17 DIAGNOSIS — R110 Nausea: Secondary | ICD-10-CM

## 2019-10-17 DIAGNOSIS — E875 Hyperkalemia: Secondary | ICD-10-CM | POA: Insufficient documentation

## 2019-10-17 DIAGNOSIS — R208 Other disturbances of skin sensation: Secondary | ICD-10-CM | POA: Diagnosis not present

## 2019-10-17 LAB — COMPREHENSIVE METABOLIC PANEL
ALT: 21 U/L (ref 0–44)
AST: 22 U/L (ref 15–41)
Albumin: 4 g/dL (ref 3.5–5.0)
Alkaline Phosphatase: 65 U/L (ref 38–126)
Anion gap: 12 (ref 5–15)
BUN: 59 mg/dL — ABNORMAL HIGH (ref 8–23)
CO2: 28 mmol/L (ref 22–32)
Calcium: 9.7 mg/dL (ref 8.9–10.3)
Chloride: 102 mmol/L (ref 98–111)
Creatinine, Ser: 1.29 mg/dL — ABNORMAL HIGH (ref 0.44–1.00)
GFR calc Af Amer: 49 mL/min — ABNORMAL LOW (ref >60)
GFR calc non Af Amer: 42 mL/min — ABNORMAL LOW (ref >60)
Glucose, Bld: 72 mg/dL (ref 70–99)
Potassium: 4.1 mmol/L (ref 3.5–5.1)
Sodium: 142 mmol/L (ref 135–145)
Total Bilirubin: 0.6 mg/dL (ref 0.3–1.2)
Total Protein: 6.9 g/dL (ref 6.5–8.1)

## 2019-10-17 LAB — LIPASE, BLOOD: Lipase: 16 U/L (ref 11–51)

## 2019-10-17 MED ORDER — LIDOCAINE 5 % EX OINT: 1.0000 "application " | TOPICAL_OINTMENT | Freq: Three times a day (TID) | CUTANEOUS | 0 refills | Status: DC | PRN

## 2019-10-17 MED ORDER — ONDANSETRON 4 MG PO TBDP: 4.0000 mg | ORAL_TABLET | Freq: Three times a day (TID) | ORAL | 0 refills | Status: DC | PRN

## 2019-10-17 MED ORDER — MICONAZOLE NITRATE 2 % POWD: 0 refills | Status: DC

## 2019-10-17 NOTE — Progress Notes (Signed)
Patient ID: Summer [name], female    DOB: 27-Sep-1950, 69 y.o.   MRN: [id number]  PCP: Steele Sizer, MD  Chief Complaint  Patient presents with  . Abdominal Pain    unchanged RLA and flank    Subjective:   Summer [name] is a 69 y.o. female, presents to clinic with CC of the following:  Pt here for recheck on right low abd/groin/flank pain.   Pain medicine helps only a little bit.  She did go get her labs this morning.  Most of those have resulted in been reviewed and are improved from a few days ago.  She reports that her blood sugar was 95 this morning.  She previously had 1 episode of vomiting without any nausea at all but she reports feeling nauseous today she did not eat this morning checked her blood sugar treated herself with insulin and later went and got her labs done.  She ate prior to coming here and did have a biscuit and sausage sandwich  She continues to have pain in her skin to her little low right groin area without any skin changes or rash.  She continues to have abdominal pain to her right side and right upper quadrant without any urinary symptoms, no change with eating, improved with positional changes laying on her left side" getting pressure" off of the areas of pain  She has appeared short of breath when she moves around the exam room both earlier this week and again today patient does state that she has increased slightly in weight, 2 months ago was 255 this week was 2 59-60 she does have some lower extremity edema and has shortness of breath with exertion.  Wt Readings from Last 5 Encounters:  10/17/19 260 lb 3.2 oz (118 kg)  10/14/19 259 lb 12.8 oz (117.8 kg)  09/02/19 255 lb (115.7 kg)  08/19/19 255 lb (115.7 kg)  08/14/19 257 lb (116.6 kg)   Patient did hold her her potassium supplement but continues to take her Lasix her potassium recheck this morning was normal she was encouraged to take her Lasix and potassium together again like normal  Patient Active  Problem List   Diagnosis Date Noted  . Preop cardiovascular exam 07/18/2019  . Major depressive disorder, recurrent episode, moderate ([place]) 04/04/2019  . Left arm weakness 04/03/2019  . (HFpEF) heart failure with preserved ejection fraction ([place]) 08/31/2017  . Moderate persistent asthma 08/31/2017  . Hyperlipidemia due to type 2 diabetes mellitus ([place]) 06/14/2017  . Dyslipidemia associated with type 2 diabetes mellitus ([place]) 08/18/2016  . Charcot foot due to diabetes mellitus ([place]) 02/22/2016  . Acquired abduction deformity of foot 07/12/2015  . Osteoarthritis of subtalar joint 07/12/2015  . Poorly controlled type 2 diabetes mellitus with neuropathy ([place]) 07/12/2015  . Arthritis of foot, degenerative 07/12/2015  . Carpal tunnel syndrome 04/17/2015  . Chronic constipation 04/17/2015  . Insomnia, persistent 04/17/2015  . Stage 3 chronic kidney disease 04/17/2015  . Decreased exercise tolerance 04/17/2015  . Diabetes mellitus with polyneuropathy ([place]) 04/17/2015  . Gastro-esophageal reflux disease without esophagitis 04/17/2015  . Bursitis, trochanteric 04/17/2015  . Cephalalgia 04/17/2015  . Benign hypertension 04/17/2015  . Adult hypothyroidism 04/17/2015  . Hearing loss 04/17/2015  . Chronic recurrent major depressive disorder ([place]) 04/17/2015  . Neurogenic claudication 04/17/2015  . Morbid obesity ([place]) 04/17/2015  . Hypo-ovarianism 04/17/2015  . Perennial allergic rhinitis with seasonal variation 04/17/2015  . Acne erythematosa 04/17/2015  . Dyskinesia, tardive 04/17/2015  .  Memory loss 04/17/2015  . Impingement syndrome of shoulder 04/17/2015  . Dermatitis, stasis 04/17/2015  . Obstructive sleep apnea 05/14/2014  . Dyspnea 05/06/2014  . Hyperlipidemia 02/06/2012  . LBP (low back pain) 09/16/2008      Current Outpatient Medications:  .  ACCU-CHEK SOFTCLIX LANCETS lancets, , Disp: , Rfl:  .  acetaminophen (TYLENOL 8 HOUR) 650 MG CR tablet, Take 1 tablet (650 mg total)  by mouth every 8 (eight) hours as needed for pain., Disp: 90 tablet, Rfl: 2 .  albuterol (PROVENTIL HFA) 108 (90 Base) MCG/ACT inhaler, Inhale 2 puffs into the lungs 2 (two) times daily as needed for shortness of breath. , Disp: , Rfl:  .  Alcohol Swabs (B-D SINGLE USE SWABS REGULAR) PADS, , Disp: , Rfl:  .  amLODipine (NORVASC) 5 MG tablet, Take 1 tablet (5 mg total) by mouth daily., Disp: 90 tablet, Rfl: 2 .  ASPIRIN LOW DOSE 81 MG EC tablet, TAKE 1 TABLET (81 MG TOTAL) BY MOUTH DAILY., Disp: 30 tablet, Rfl: 0 .  Blood Glucose Monitoring Suppl (ACCU-CHEK AVIVA PLUS) w/Device KIT, , Disp: , Rfl:  .  Cyanocobalamin (VITAMIN B12 PO), Take 1 tablet by mouth daily. , Disp: , Rfl:  .  DULoxetine (CYMBALTA) 30 MG capsule, Take 1 capsule by mouth daily., Disp: 30 capsule, Rfl: 2 .  furosemide (LASIX) 20 MG tablet, Take 1 tablet by mouth daily., Disp: 90 tablet, Rfl: 0 .  Icosapent Ethyl (VASCEPA) 1 g CAPS, Take 2 capsules (2 g total) by mouth 2 (two) times daily., Disp: 360 capsule, Rfl: 1 .  insulin NPH-regular Human (70-30) 100 UNIT/ML injection, Inject 100 Units into the skin 2 (two) times daily with a meal., Disp: , Rfl:  .  irbesartan-hydrochlorothiazide (AVALIDE) 300-12.5 MG tablet, Take 1 tablet by mouth daily., Disp: 90 tablet, Rfl: 0 .  levothyroxine (SYNTHROID) 25 MCG tablet, Take 1 tablet by mouth daily except, take 2 tablets by mouth on Sunday, Disp: 100 tablet, Rfl: 0 .  metFORMIN (GLUCOPHAGE-XR) 500 MG 24 hr tablet, Take 500 mg by mouth. 2 tablets daily (total of 1000 mg daily), Disp: , Rfl:  .  metoprolol tartrate (LOPRESSOR) 25 MG tablet, Take 1 tablet (25 mg total) by mouth 2 (two) times daily., Disp: 180 tablet, Rfl: 1 .  Misc Natural Products (OSTEO BI-FLEX ADV JOINT SHIELD PO), Take by mouth 2 (two) times daily. , Disp: , Rfl:  .  montelukast (SINGULAIR) 10 MG tablet, Take 1 tablet (10 mg total) by mouth at bedtime., Disp: 90 tablet, Rfl: 1 .  Omega-3 Fatty Acids (FISH OIL) 1000 MG  CAPS, Take 2,000 mg by mouth daily. , Disp: , Rfl:  .  omeprazole (PRILOSEC) 40 MG capsule, Take 1 capsule by mouth daily., Disp: 90 capsule, Rfl: 0 .  Potassium 99 MG TABS, Take by mouth., Disp: , Rfl:  .  pregabalin (LYRICA) 300 MG capsule, Take 300 mg by mouth 2 (two) times a day., Disp: , Rfl:  .  Probiotic Product (PROBIOTIC-10 PO), Take by mouth., Disp: , Rfl:  .  RELION INSULIN SYRINGE 1ML/31G 31G X 5/16" 1 ML MISC, , Disp: , Rfl:  .  rosuvastatin (CRESTOR) 20 MG tablet, Take 1 tablet (20 mg total) by mouth daily., Disp: 90 tablet, Rfl: 1 .  sertraline (ZOLOFT) 50 MG tablet, Take 1 tablet by mouth daily., Disp: 90 tablet, Rfl: 0 .  traMADol (ULTRAM) 50 MG tablet, Take 1 tablet (50 mg total) by mouth every 8 (eight) hours  as needed for up to 5 days., Disp: 15 tablet, Rfl: 0 .  traZODone (DESYREL) 100 MG tablet, TAKE 1/2 (ONE-HALF) TABLET BY MOUTH NIGHTLY AT BEDTIME AS NEEDED FOR SLEEP, Disp: 30 tablet, Rfl: 0 .  traZODone (DESYREL) 50 MG tablet, Take 1 tablet (50 mg total) by mouth at bedtime as needed for sleep., Disp: 90 tablet, Rfl: 1 .  vitamin C (ASCORBIC ACID) 500 MG tablet, Take 500 mg by mouth 2 (two) times daily. , Disp: , Rfl:  .  VITAMIN D, CHOLECALCIFEROL, PO, Take 1,000 Units by mouth daily. , Disp: , Rfl:    Allergies  Allergen Reactions  . Codeine Other (See Comments)    "TRIPPED OUT"  DIDN'T LIKE THE [place]  . Atorvastatin     muscle pain  . Hydrocodone     itching  . Latex Rash  . Zolpidem Other (See Comments)    Sleep walk     Family History  Problem Relation Age of Onset  . Aneurysm Mother   . Aortic aneurysm Mother   . Heart attack Maternal Grandfather   . Diabetes Maternal Grandfather      Social History   Socioeconomic History  . Marital status: Significant Other    Spouse name: Not on file  . Number of children: 1  . Years of education: Not on file  . Highest education level: High school graduate  Occupational History  . Occupation:  retired  Tobacco Use  . Smoking status: Never Smoker  . Smokeless tobacco: Never Used  Substance and Sexual Activity  . Alcohol use: No    Alcohol/week: 0.0 standard drinks  . Drug use: No  . Sexual activity: Yes    Partners: Male  Other Topics Concern  . Not on file  Social History Narrative   Son lives in [place] (retired from [employer]) and her granddaughter (18yr old[place]   Social Determinants of Health   Financial Resource Strain:   . Difficulty of Paying Living Expenses: Not on file  Food Insecurity:   . Worried About RCharity fundraiserin the Last Year: Not on file  . Ran Out of Food in the Last Year: Not on file  Transportation Needs:   . Lack of Transportation (Medical): Not on file  . Lack of Transportation (Non-Medical): Not on file  Physical Activity:   . Days of Exercise per Week: Not on file  . Minutes of Exercise per Session: Not on file  Stress: Stress Concern Present  . Feeling of Stress : To some extent  Social Connections:   . Frequency of Communication with Friends and Family: Not on file  . Frequency of Social Gatherings with Friends and Family: Not on file  . Attends Religious Services: Not on file  . Active Member of Clubs or Organizations: Not on file  . Attends CArchivistMeetings: Not on file  . Marital Status: Not on file  Intimate Partner Violence:   . Fear of Current or Ex-Partner: Not on file  . Emotionally Abused: Not on file  . Physically Abused: Not on file  . Sexually Abused: Not on file    Chart Review Today: No change from visit earlier this week, reviewed all new labs CT   Review of Systems  Constitutional: Negative.   HENT: Negative.   Eyes: Negative.   Respiratory: Negative.   Cardiovascular: Negative.   Gastrointestinal: Negative.   Endocrine: Negative.   Genitourinary: Negative.  Musculoskeletal: Negative.   Skin: Negative.   Allergic/Immunologic: Negative.   Neurological:  Negative.   Hematological: Negative.   Psychiatric/Behavioral: Negative.   All other systems reviewed and are negative.      Objective:   Vitals:   10/17/19 0959  BP: (!) 136/58  Pulse: 87  Resp: 14  Temp: 97.8 F (36.6 C)  SpO2: 93%  Weight: 260 lb 3.2 oz (118 kg)  Height: '5\' 2"'$  (1.575 m)    Body mass index is 47.59 kg/m.  Physical Exam Vitals and nursing note reviewed.  Constitutional:      General: She is not in acute distress.    Appearance: She is well-developed. She is obese. She is not ill-appearing, toxic-appearing or diaphoretic.     Interventions: Face mask in place.     Comments: Chronically ill and uncomfortable appearing, non-toxic  HENT:     Head: Normocephalic and atraumatic.     Right Ear: External ear normal.     Left Ear: External ear normal.  Eyes:     General: Lids are normal. No scleral icterus.       Right eye: No discharge.        Left eye: No discharge.     Conjunctiva/sclera: Conjunctivae normal.  Neck:     Trachea: Phonation normal. No tracheal deviation.  Cardiovascular:     Rate and Rhythm: Normal rate and regular rhythm.     Pulses: Normal pulses.          Radial pulses are 2+ on the right side and 2+ on the left side.       Posterior tibial pulses are 2+ on the right side and 2+ on the left side.     Heart sounds: Normal heart sounds. No murmur. No friction rub. No gallop.   Pulmonary:     Effort: Pulmonary effort is normal. Tachypnea present. No accessory muscle usage or respiratory distress.     Breath sounds: Normal breath sounds. No stridor. No wheezing, rhonchi or rales.     Comments: Pt out of breath (appearing deconditioned not distressed) with getting up and down off exam table with assitance Chest:     Chest wall: No tenderness.  Abdominal:     General: Abdomen is protuberant. Bowel sounds are normal. There is no distension.     Palpations: Abdomen is soft.     Tenderness: There is abdominal tenderness in the right lower  quadrant. There is guarding (voluntary). There is no right CVA tenderness, left CVA tenderness or rebound. Positive signs include McBurney's sign. Negative signs include Murphy's sign, Rovsing's sign, psoas sign and obturator sign.     Comments: Obese abdomen - exam somewhat limited by body habitus RLQ to right side ttp with deep palpation and also with lighter palpation or manipulation of panus, no abdominal wall edema, induration erythema No right inguinal or femoral hernia appreciated  Musculoskeletal:        General: No deformity. Normal range of motion.     Cervical back: Normal range of motion and neck supple.     Right lower leg: 1+ Edema present.     Left lower leg: 1+ Edema present.  Lymphadenopathy:     Cervical: No cervical adenopathy.  Skin:    General: Skin is warm and dry.     Capillary Refill: Capillary refill takes less than 2 seconds.     Coloration: Skin is not jaundiced or pale.     Findings: No rash.     Comments:  Skin fold of panus and groin creases inspected - no rash   Neurological:     Mental Status: She is alert and oriented to person, place, and time.     Gait: Gait abnormal.  Psychiatric:        Attention and Perception: Attention normal.        Behavior: Behavior is cooperative.      Results for orders placed or performed during the hospital encounter of 10/17/19  H. pylori breath test  Result Value Ref Range   H. pylori UBiT Negative Negative  Comprehensive metabolic panel  Result Value Ref Range   Sodium 142 135 - 145 mmol/L   Potassium 4.1 3.5 - 5.1 mmol/L   Chloride 102 98 - 111 mmol/L   CO2 28 22 - 32 mmol/L   Glucose, Bld 72 70 - 99 mg/dL   BUN 59 (H) 8 - 23 mg/dL   Creatinine, Ser 1.29 (H) 0.44 - 1.00 mg/dL   Calcium 9.7 8.9 - 10.3 mg/dL   Total Protein 6.9 6.5 - 8.1 g/dL   Albumin 4.0 3.5 - 5.0 g/dL   AST 22 15 - 41 U/L   ALT 21 0 - 44 U/L   Alkaline Phosphatase 65 38 - 126 U/L   Total Bilirubin 0.6 0.3 - 1.2 mg/dL   GFR calc non Af  Amer 42 (L) >60 mL/min   GFR calc Af Amer 49 (L) >60 mL/min   Anion gap 12 5 - 15  Lipase, blood  Result Value Ref Range   Lipase 16 11 - 51 U/L        Assessment & Plan:      ICD-10-CM   1. Skin pain  R20.8 lidocaine (XYLOCAINE) 5 % ointment    Miconazole Nitrate 2 % POWD   skin fold right low groin/panus w/o evident etiology, no rash, lesions, cellulitis or abscess, no induration of skin, no lymphadenopathy?  topical tx?  2. Abdominal pain, unspecified abdominal location  R10.9 Ambulatory referral to Gastroenterology   RLQ to RUQ, some nausea, CT neg, labs improved, no gallbladder abnormality on CT 3 d ago? s/p appendectomy, no hernia etiology unclear, no diarrhea  3. Nausea  R11.0 ondansetron (ZOFRAN ODT) 4 MG disintegrating tablet     Pt ambulated around clinic to check out, I rechecked her HR and SpO2 HR 100 and SpO2 dropped to91% on RA - she was instructed to monitor her weight, LE edema, take her lasix, she has pulse ox at home, she was instructed to monitor SpO2 and if she goes below 90% or gains any weight, has SOB, worse abd pain, she needed to go to the hospital for assessment or HF clinic or UC.    Renal function better with repeated labs, potassium normal - resume K with lasix and recheck in a week or so, lipase normal now, no more vomiting, some nausea, abd pain unchanged.      Delsa Grana, PA-C 10/17/19 10:37 AM

## 2019-10-17 NOTE — Patient Instructions (Signed)

## 2019-10-18 LAB — H. PYLORI BREATH TEST: H. pylori UBiT: NEGATIVE

## 2019-10-22 ENCOUNTER — Encounter: Payer: Self-pay | Admitting: Family Medicine

## 2019-10-23 ENCOUNTER — Ambulatory Visit: Payer: Medicare HMO | Admitting: Gastroenterology

## 2019-10-23 ENCOUNTER — Other Ambulatory Visit: Payer: Self-pay

## 2019-10-23 VITALS — BP 144/80 | HR 76 | Temp 98.4°F | Ht 62.0 in | Wt 253.8 lb

## 2019-10-23 DIAGNOSIS — M7918 Myalgia, other site: Secondary | ICD-10-CM

## 2019-10-23 NOTE — Progress Notes (Signed)
Jonathon Bellows MD, MRCP(U.K) [address]  [place]  [place], [place] [address]  Main: [phone]  Fax: [phone]   Gastroenterology Consultation  Referring Provider:     Steele Sizer, MD Primary Care Physician:  Steele Sizer, MD Primary Gastroenterologist:  Dr. Jonathon Bellows  Reason for Consultation:    Abdominal pain        HPI:   Summer [name] is a 69 y.o. y/o female referred for consultation & management  by Dr. Ancil Boozer, Drue Stager, MD.      Summer [name] was seen at her primary care office on 10/14/2019 for abdominal pain.  Right lower quadrant and right flank.  Associated with vomiting.  Worse with movement.  History of left oophorectomy.  History of kidney stones.  Summer [name] underwent CT scan of the abdomen which demonstrated no acute abnormalities.  Severe multilevel degenerative disc disease is noted in the lumbar spine.  Lab work performed on the same day showed a normal lipase, LFTs that were normal.  H. pylori breath test that was normal.  Negative urine analysis and hemoglobin of 11.4 g which has been stable. I performed a colonoscopy in December 2020 a single polyp in the sigmoid colon which was 3 mm was resected.  Pathology report demonstrated a leiomyoma.  This is benign.  Summer [name] states that Summer [name] has had the pain for a couple of months points to the right flank when I asked him more about the nature of the pain Summer [name] did clearly points out to her back radiating to the front describes it as a knifelike sensation.  Aggravated when Summer [name] lies flat on her bed.  Worse with certain movements.  Summer [name] also has some pain in the right groin as well worse with movement. Past Medical History:  Diagnosis Date  . Anemia   . Arthritis   . Asthma   . Carpal tunnel syndrome   . Chronic kidney disease   . Depressive disorder   . Diabetes mellitus   . Dyspnea   . GERD (gastroesophageal reflux disease)   . Headache   . History of hiatal hernia   . Hyperlipidemia   . Hypertension   . Insomnia   .  Lumbago   . Memory loss   . Obesity   . 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)   . Pneumonia   . Rhinitis, allergic   . Rosacea   . Thyroid disease   . Unspecified hearing loss   . Unspecified hereditary and idiopathic peripheral neuropathy   . Unspecified sleep apnea     Past Surgical History:  Procedure Laterality Date  . ANKLE SURGERY Left approx Jan 2018  . CATARACT EXTRACTION  01/2011   right  . COLONOSCOPY WITH PROPOFOL N/A 08/19/2019   Procedure: COLONOSCOPY WITH PROPOFOL;  Surgeon: Jonathon Bellows, MD;  Location[facility] END[facility];  Service: Gastroenterology;  Laterality: N/A;  . eye lid surgery  2013   bilateral  . FOOT SURGERY    . NECK SURGERY    . TUBAL LIGATION    . VAGINAL HYSTERECTOMY  1989    Prior to Admission medications   Medication Sig Start Date End Date Taking? Authorizing Provider  ACCU-CHEK SOFTCLIX LANCETS lancets  08/26/17   [provider]  acetaminophen (TYLENOL 8 HOUR) 650 MG CR tablet Take 1 tablet (650 mg total) by mouth every 8 (eight) hours as needed for pain. 10/16/18   Steele Sizer, MD  albuterol (PROVENTIL HFA) 108 (90 Base) MCG/ACT inhaler Inhale 2 puffs into the lungs 2 (two)  times daily as needed for shortness of breath.     [provider]  Alcohol Swabs (B-D SINGLE USE SWABS REGULAR) PADS  01/04/18   [provider]  amLODipine (NORVASC) 5 MG tablet Take 1 tablet (5 mg total) by mouth daily. 08/14/19 11/12/19  Marrianne Mood D, PA-C  ASPIRIN LOW DOSE 81 MG EC tablet TAKE 1 TABLET (81 MG TOTAL) BY MOUTH DAILY. 05/31/16   Steele Sizer, MD  Blood Glucose Monitoring Suppl (ACCU-CHEK AVIVA PLUS) w/Device KIT  11/02/15   [provider]  Cyanocobalamin (VITAMIN B12 PO) Take 1 tablet by mouth daily.     [provider]  DULoxetine (CYMBALTA) 30 MG capsule Take 1 capsule by mouth daily. 08/31/19   Steele Sizer, MD  furosemide (LASIX) 20 MG tablet Take 1 tablet by mouth daily. 09/28/19   Steele Sizer, MD    Icosapent Ethyl (VASCEPA) 1 g CAPS Take 2 capsules (2 g total) by mouth 2 (two) times daily. 05/07/19   Steele Sizer, MD  insulin NPH-regular Human (70-30) 100 UNIT/ML injection Inject 100 Units into the skin 2 (two) times daily with a meal.    Lonia Farber, MD  irbesartan-hydrochlorothiazide (AVALIDE) 300-12.5 MG tablet Take 1 tablet by mouth daily. 08/31/19   Steele Sizer, MD  levothyroxine (SYNTHROID) 25 MCG tablet Take 1 tablet by mouth daily except, take 2 tablets by mouth on Sunday 09/28/19   Steele Sizer, MD  lidocaine (XYLOCAINE) 5 % ointment Apply 1 application topically 3 (three) times daily as needed. To area of skin pain 10/17/19   Delsa Grana, PA-C  metFORMIN (GLUCOPHAGE-XR) 500 MG 24 hr tablet Take 500 mg by mouth. 2 tablets daily (total of 1000 mg daily) 12/03/17   [provider]  metoprolol tartrate (LOPRESSOR) 25 MG tablet Take 1 tablet (25 mg total) by mouth 2 (two) times daily. 05/07/19   Steele Sizer, MD  Miconazole Nitrate 2 % POWD Apply to skin folds twice a day 10/17/19   Delsa Grana, PA-C  Misc Natural Products (OSTEO BI-FLEX ADV JOINT SHIELD PO) Take by mouth 2 (two) times daily.     [provider]  montelukast (SINGULAIR) 10 MG tablet Take 1 tablet (10 mg total) by mouth at bedtime. 05/07/19   Steele Sizer, MD  Omega-3 Fatty Acids (FISH OIL) 1000 MG CAPS Take 2,000 mg by mouth daily.     [provider]  omeprazole (PRILOSEC) 40 MG capsule Take 1 capsule by mouth daily. 08/04/19   Steele Sizer, MD  ondansetron (ZOFRAN ODT) 4 MG disintegrating tablet Take 1 tablet (4 mg total) by mouth every 8 (eight) hours as needed for nausea or vomiting. 10/17/19   Delsa Grana, PA-C  Potassium 99 MG TABS Take by mouth.    [provider]  pregabalin (LYRICA) 300 MG capsule Take 300 mg by mouth 2 (two) times a day. 03/06/19   [provider]  Probiotic Product (PROBIOTIC-10 PO) Take by mouth.    [provider]   [place] 1ML/31G 31G X 5/16" 1 ML MISC  12/19/15   [provider]  rosuvastatin (CRESTOR) 20 MG tablet Take 1 tablet (20 mg total) by mouth daily. 05/07/19   Steele Sizer, MD  sertraline (ZOLOFT) 50 MG tablet Take 1 tablet by mouth daily. 08/04/19   Steele Sizer, MD  traZODone (DESYREL) 100 MG tablet TAKE 1/2 (ONE-HALF) TABLET BY MOUTH NIGHTLY AT BEDTIME AS NEEDED FOR SLEEP 10/03/19   Steele Sizer, MD  traZODone (DESYREL) 50 MG  tablet Take 1 tablet (50 mg total) by mouth at bedtime as needed for sleep. 05/07/19   Steele Sizer, MD  vitamin C (ASCORBIC ACID) 500 MG tablet Take 500 mg by mouth 2 (two) times daily.     [provider]  VITAMIN D, CHOLECALCIFEROL, PO Take 1,000 Units by mouth daily.     [provider]    Family History  Problem Relation Age of Onset  . Aneurysm Mother   . Aortic aneurysm Mother   . Heart attack Maternal Grandfather   . Diabetes Maternal Grandfather      Social History   Tobacco Use  . Smoking status: Never Smoker  . Smokeless tobacco: Never Used  Substance Use Topics  . Alcohol use: No    Alcohol/week: 0.0 standard drinks  . Drug use: No    Allergies as of 10/23/2019 - Review Complete 10/22/2019  Allergen Reaction Noted  . Codeine Other (See Comments) 02/02/2012  . Atorvastatin  04/17/2015  . Hydrocodone  12/16/2018  . Latex Rash 08/15/2013  . Zolpidem Other (See Comments) 10/06/2016    Review of Systems:    All systems reviewed and negative except where noted in HPI.   Physical Exam:  There were no vitals taken for this visit. No LMP recorded. Patient has had a hysterectomy. Psych:  Alert and cooperative. Normal mood and affect. General:   Walks with a walker Head:  Normocephalic and atraumatic. Eyes:  Sclera clear, no icterus.   Conjunctiva pink. Ears:  Normal auditory acuity. Abdomen:  Normal bowel sounds.  No bruits.  Soft, non-tender and non-distended without masses,  hepatosplenomegaly or hernias noted.  No guarding or rebound tenderness.    Extremities:  No clubbing or edema.  No cyanosis.  Summer [name] is tender over her right paraspinal muscles in the lumbar area and on application of pressure it replicates the pain that Summer [name] has.  The discomfort is also worse when Summer [name] flexes on the right side.  And bends forward. Neurologic:  Alert and oriented x3;  grossly normal neurologically. Psych:  Alert and cooperative. Normal mood and affect.  Imaging Studies: CT Abdomen Pelvis Wo Contrast  Result Date: 10/14/2019 CLINICAL DATA:  Acute right lower quadrant abdominal pain. EXAM: CT ABDOMEN AND PELVIS WITHOUT CONTRAST TECHNIQUE: Multidetector CT imaging of the abdomen and pelvis was performed following the standard protocol without IV contrast. COMPARISON:  None. FINDINGS: Lower chest: No acute abnormality. Hepatobiliary: No focal liver abnormality is seen. No gallstones, gallbladder wall thickening, or biliary dilatation. Pancreas: Unremarkable. No pancreatic ductal dilatation or surrounding inflammatory changes. Spleen: Normal in size without focal abnormality. Adrenals/Urinary Tract: Adrenal glands are unremarkable. Kidneys are normal, without renal calculi, focal lesion, or hydronephrosis. Bladder is unremarkable. Stomach/Bowel: Stomach appears normal. No evidence of bowel obstruction or inflammation is noted. The appendix is not visualized, but no definite inflammation is noted in the right lower quadrant. Vascular/Lymphatic: Aortic atherosclerosis. No enlarged abdominal or pelvic lymph nodes. Reproductive: Status post hysterectomy. No adnexal masses. Other: No abdominal wall hernia or abnormality. No abdominopelvic ascites. Musculoskeletal: Severe multilevel degenerative disc disease is noted in the lumbar spine. No acute abnormality is noted. IMPRESSION: Aortic atherosclerosis. No acute abnormality seen in the abdomen or pelvis. Severe multilevel degenerative disc disease is noted  in the lumbar spine. Aortic Atherosclerosis (ICD10-I70.0). Electronically Signed   By: Marijo Conception M.D.   On: 10/14/2019 15:23    Assessment and Plan:   MEREDYTH HORNUNG is a 69 y.o. y/o female has been  referred for abdominal pain.  CT scan of the abdomen showed no abnormalities and recent colonoscopy was negative.  Pain is in the right side radiating from the back to the front.  The pain begins in the right paraspinal area in the upper lumbar region and radiates to the right upper quadrant area.  Knifelike in nature.  Tender on palpation in the right paraspinal area lumbar region.  Replicates the pain.  Worse when Summer [name] flexes as well.  CT scan of the abdomen also did show severe multilevel degenerative disc disease in the lumbar spine which corresponds with my examination findings.  I do not believe her symptoms are related to her digestive system.  I have informed her that I will let her physicians office consider an MRI of the spine and determine further course of action.  I have suggested Summer [name] can try taking 1-2 Tylenol tablets as needed for the discomfort.   Follow up in as needed  Dr Jonathon Bellows MD,MRCP(U.K)

## 2019-10-27 ENCOUNTER — Other Ambulatory Visit: Payer: Self-pay | Admitting: Family Medicine

## 2019-10-28 ENCOUNTER — Other Ambulatory Visit: Payer: Self-pay | Admitting: Family Medicine

## 2019-10-28 DIAGNOSIS — K219 Gastro-esophageal reflux disease without esophagitis: Secondary | ICD-10-CM

## 2019-10-29 ENCOUNTER — Other Ambulatory Visit: Payer: Self-pay | Admitting: Family Medicine

## 2019-11-03 ENCOUNTER — Encounter: Payer: Self-pay | Admitting: Family Medicine

## 2019-11-03 ENCOUNTER — Ambulatory Visit (INDEPENDENT_AMBULATORY_CARE_PROVIDER_SITE_OTHER): Payer: Medicare HMO | Admitting: Family Medicine

## 2019-11-03 ENCOUNTER — Ambulatory Visit
Admission: RE | Admit: 2019-11-03 | Discharge: 2019-11-03 | Disposition: A | Discharge status: Home / Self Care | Payer: Medicare HMO | Attending: Family Medicine | Admitting: Family Medicine

## 2019-11-03 ENCOUNTER — Other Ambulatory Visit: Payer: Self-pay

## 2019-11-03 ENCOUNTER — Ambulatory Visit
Admission: RE | Admit: 2019-11-03 | Discharge: 2019-11-03 | Disposition: A | Discharge status: Home / Self Care | Payer: Medicare HMO | Source: Ambulatory Visit | Attending: Family Medicine | Admitting: Family Medicine

## 2019-11-03 VITALS — BP 150/90 | HR 93 | Temp 97.7°F | Resp 16 | Ht 62.0 in | Wt 253.4 lb

## 2019-11-03 DIAGNOSIS — F5105 Insomnia due to other mental disorder: Secondary | ICD-10-CM

## 2019-11-03 DIAGNOSIS — M5410 Radiculopathy, site unspecified: Secondary | ICD-10-CM | POA: Diagnosis not present

## 2019-11-03 DIAGNOSIS — M5136 Other intervertebral disc degeneration, lumbar region: Secondary | ICD-10-CM

## 2019-11-03 DIAGNOSIS — I10 Essential (primary) hypertension: Secondary | ICD-10-CM | POA: Diagnosis not present

## 2019-11-03 DIAGNOSIS — I700 Atherosclerosis of aorta: Secondary | ICD-10-CM | POA: Diagnosis not present

## 2019-11-03 DIAGNOSIS — M25551 Pain in right hip: Secondary | ICD-10-CM | POA: Diagnosis not present

## 2019-11-03 DIAGNOSIS — E1142 Type 2 diabetes mellitus with diabetic polyneuropathy: Secondary | ICD-10-CM

## 2019-11-03 DIAGNOSIS — D229 Melanocytic nevi, unspecified: Secondary | ICD-10-CM

## 2019-11-03 DIAGNOSIS — F99 Mental disorder, not otherwise specified: Secondary | ICD-10-CM

## 2019-11-03 DIAGNOSIS — F331 Major depressive disorder, recurrent, moderate: Secondary | ICD-10-CM | POA: Diagnosis not present

## 2019-11-03 DIAGNOSIS — Z6841 Body Mass Index (BMI) 40.0 and over, adult: Secondary | ICD-10-CM

## 2019-11-03 DIAGNOSIS — G4733 Obstructive sleep apnea (adult) (pediatric): Secondary | ICD-10-CM | POA: Diagnosis not present

## 2019-11-03 LAB — POCT GLYCOSYLATED HEMOGLOBIN (HGB A1C): Hemoglobin A1C: 6 % — AB (ref 4.0–5.6)

## 2019-11-03 MED ORDER — TRAZODONE HCL 50 MG PO TABS: 50.0000 mg | ORAL_TABLET | Freq: Every evening | ORAL | 1 refills | Status: DC | PRN

## 2019-11-03 MED ORDER — MODAFINIL 100 MG PO TABS: 100.0000 mg | ORAL_TABLET | Freq: Every day | ORAL | 0 refills | Status: DC

## 2019-11-03 MED ORDER — SERTRALINE HCL 100 MG PO TABS: 50.0000 mg | ORAL_TABLET | Freq: Every day | ORAL | 0 refills | Status: DC

## 2019-11-03 NOTE — Progress Notes (Signed)
Name: Summer [name]   MRN: [id number]    DOB: 03-02-1951   Date:11/03/2019       Progress Note  Subjective  Chief Complaint  Chief Complaint  Patient presents with  . Skin Pain    2 week follow up  . Diabetes    Fasting BS was 76 this morning.  . Medication Refill    Needs a refill on her Tramadol.    HPI  Right lower back pain and right hip pain: symptoms started two months ago. She states it started suddenly pain is described as intense, sharp like a knife and also has a burning sensation on right groin. She has seen Delsa Grana and had GI evaluation including CT and was referred to Dr. Vicente Males that discussed it with her not GI related, but likely from DDD lumbar spine. She has tried topical medication and otc Tylenol and [place] but it caused upset stomach , she states Tramadol helps some but has been out of medication   Atherosclerosis of aorta: found on CT abdomen, discussed importance of continues statin therapy   DDD lumbar spine: found on CT abdomen and may be the cause of pain radiating to right groin   Diabetes : she is under the care of Endocrinologist, glucose has been better controlled lately, this am it was 76, denies symptoms  MDD: she is seeing a therapist, who suggested increasing dose of Zoloft, she still has depression , phq9 is high but she states she has been in pain and not feeling well. We will stop Duloxetine and try going up on zoloft today. Explained that she really needs to see a psychiatrist.   OSA: she continues to wake up feeling tired, we will add modafinil but explained she needs to return to recheck bp before starting on medication since bp is not at goal   HTN: bp is not controlled today, denies chest pain or headache   Patient Active Problem List   Diagnosis Date Noted  . Preop cardiovascular exam 07/18/2019  . Major depressive disorder, recurrent episode, moderate ([place]) 04/04/2019  . Left arm weakness 04/03/2019  . (HFpEF) heart failure with  preserved ejection fraction ([place]) 08/31/2017  . Moderate persistent asthma 08/31/2017  . Hyperlipidemia due to type 2 diabetes mellitus ([place]) 06/14/2017  . Dyslipidemia associated with type 2 diabetes mellitus ([place]) 08/18/2016  . Charcot foot due to diabetes mellitus ([place]) 02/22/2016  . Acquired abduction deformity of foot 07/12/2015  . Osteoarthritis of subtalar joint 07/12/2015  . Poorly controlled type 2 diabetes mellitus with neuropathy ([place]) 07/12/2015  . Arthritis of foot, degenerative 07/12/2015  . Carpal tunnel syndrome 04/17/2015  . Chronic constipation 04/17/2015  . Insomnia, persistent 04/17/2015  . Stage 3 chronic kidney disease 04/17/2015  . Decreased exercise tolerance 04/17/2015  . Diabetes mellitus with polyneuropathy ([place]) 04/17/2015  . Gastro-esophageal reflux disease without esophagitis 04/17/2015  . Bursitis, trochanteric 04/17/2015  . Cephalalgia 04/17/2015  . Benign hypertension 04/17/2015  . Adult hypothyroidism 04/17/2015  . Hearing loss 04/17/2015  . Chronic recurrent major depressive disorder ([place]) 04/17/2015  . Neurogenic claudication 04/17/2015  . Morbid obesity ([place]) 04/17/2015  . Hypo-ovarianism 04/17/2015  . Perennial allergic rhinitis with seasonal variation 04/17/2015  . Acne erythematosa 04/17/2015  . Dyskinesia, tardive 04/17/2015  . Memory loss 04/17/2015  . Impingement syndrome of shoulder 04/17/2015  . Dermatitis, stasis 04/17/2015  . Obstructive sleep apnea 05/14/2014  . Dyspnea 05/06/2014  . Hyperlipidemia 02/06/2012  . LBP (low back pain) 09/16/2008  Past Surgical History:  Procedure Laterality Date  . ANKLE SURGERY Left approx Jan 2018  . CATARACT EXTRACTION  01/2011   right  . COLONOSCOPY WITH PROPOFOL N/A 08/19/2019   Procedure: COLONOSCOPY WITH PROPOFOL;  Surgeon: Jonathon Bellows, MD;  Location: [facility] ENDOSCOPY;  Service: Gastroenterology;  Laterality: N/A;  . eye lid surgery  2013   bilateral  . FOOT SURGERY    . NECK SURGERY     . TUBAL LIGATION    . VAGINAL HYSTERECTOMY  1989    Family History  Problem Relation Age of Onset  . Aneurysm Mother   . Aortic aneurysm Mother   . Heart attack Maternal Grandfather   . Diabetes Maternal Grandfather     Social History   Tobacco Use  . Smoking status: Never Smoker  . Smokeless tobacco: Never Used  Substance Use Topics  . Alcohol use: No    Alcohol/week: 0.0 standard drinks  . Drug use: No     Current Outpatient Medications:  .  ACCU-CHEK SOFTCLIX LANCETS lancets, , Disp: , Rfl:  .  acetaminophen (TYLENOL 8 HOUR) 650 MG CR tablet, Take 1 tablet (650 mg total) by mouth every 8 (eight) hours as needed for pain., Disp: 90 tablet, Rfl: 2 .  albuterol (PROVENTIL HFA) 108 (90 Base) MCG/ACT inhaler, Inhale 2 puffs into the lungs 2 (two) times daily as needed for shortness of breath. , Disp: , Rfl:  .  Alcohol Swabs (B-D SINGLE USE SWABS REGULAR) PADS, , Disp: , Rfl:  .  amLODipine (NORVASC) 5 MG tablet, Take 1 tablet (5 mg total) by mouth daily., Disp: 90 tablet, Rfl: 2 .  ASPIRIN LOW DOSE 81 MG EC tablet, TAKE 1 TABLET (81 MG TOTAL) BY MOUTH DAILY., Disp: 30 tablet, Rfl: 0 .  Blood Glucose Monitoring Suppl (ACCU-CHEK AVIVA PLUS) w/Device KIT, , Disp: , Rfl:  .  Cyanocobalamin (VITAMIN B12 PO), Take 1 tablet by mouth daily. , Disp: , Rfl:  .  furosemide (LASIX) 20 MG tablet, Take 1 tablet by mouth daily., Disp: 90 tablet, Rfl: 0 .  Icosapent Ethyl (VASCEPA) 1 g CAPS, Take 2 capsules (2 g total) by mouth 2 (two) times daily., Disp: 360 capsule, Rfl: 1 .  insulin NPH-regular Human (70-30) 100 UNIT/ML injection, Inject 100 Units into the skin 2 (two) times daily with a meal., Disp: , Rfl:  .  irbesartan-hydrochlorothiazide (AVALIDE) 300-12.5 MG tablet, Take 1 tablet by mouth daily., Disp: 90 tablet, Rfl: 0 .  levothyroxine (SYNTHROID) 25 MCG tablet, Take 1 tablet by mouth daily except, take 2 tablets by mouth on Sunday, Disp: 100 tablet, Rfl: 0 .  lidocaine (XYLOCAINE) 5  % ointment, Apply 1 application topically 3 (three) times daily as needed. To area of skin pain, Disp: 30 g, Rfl: 0 .  metFORMIN (GLUCOPHAGE-XR) 500 MG 24 hr tablet, Take 500 mg by mouth. 2 tablets daily (total of 1000 mg daily), Disp: , Rfl:  .  metoprolol tartrate (LOPRESSOR) 25 MG tablet, Take 1 tablet (25 mg total) by mouth 2 (two) times daily., Disp: 180 tablet, Rfl: 1 .  Miconazole Nitrate 2 % POWD, Apply to skin folds twice a day, Disp: 100 g, Rfl: 0 .  Misc Natural Products (OSTEO BI-FLEX ADV JOINT SHIELD PO), Take by mouth 2 (two) times daily. , Disp: , Rfl:  .  montelukast (SINGULAIR) 10 MG tablet, Take 1 tablet (10 mg total) by mouth at bedtime., Disp: 90 tablet, Rfl: 1 .  Omega-3 Fatty Acids (FISH OIL)  1000 MG CAPS, Take 2,000 mg by mouth daily. , Disp: , Rfl:  .  omeprazole (PRILOSEC) 40 MG capsule, Take 1 capsule by mouth daily., Disp: 90 capsule, Rfl: 0 .  ondansetron (ZOFRAN ODT) 4 MG disintegrating tablet, Take 1 tablet (4 mg total) by mouth every 8 (eight) hours as needed for nausea or vomiting., Disp: 10 tablet, Rfl: 0 .  Potassium 99 MG TABS, Take by mouth., Disp: , Rfl:  .  pregabalin (LYRICA) 300 MG capsule, Take 300 mg by mouth 2 (two) times a day., Disp: , Rfl:  .  Probiotic Product (PROBIOTIC-10 PO), Take by mouth., Disp: , Rfl:  .  RELION INSULIN SYRINGE 1ML/31G 31G X 5/16" 1 ML MISC, , Disp: , Rfl:  .  rosuvastatin (CRESTOR) 20 MG tablet, Take 1 tablet (20 mg total) by mouth daily., Disp: 90 tablet, Rfl: 1 .  sertraline (ZOLOFT) 100 MG tablet, Take 0.5 tablets (50 mg total) by mouth daily., Disp: 90 tablet, Rfl: 0 .  traZODone (DESYREL) 50 MG tablet, Take 1 tablet (50 mg total) by mouth at bedtime as needed for sleep., Disp: 90 tablet, Rfl: 1 .  vitamin C (ASCORBIC ACID) 500 MG tablet, Take 500 mg by mouth 2 (two) times daily. , Disp: , Rfl:  .  VITAMIN D, CHOLECALCIFEROL, PO, Take 1,000 Units by mouth daily. , Disp: , Rfl:  .  modafinil (PROVIGIL) 100 MG tablet, Take 1  tablet (100 mg total) by mouth daily., Disp: 30 tablet, Rfl: 0  Allergies  Allergen Reactions  . Codeine Other (See Comments)    "TRIPPED OUT"  DIDN'T LIKE THE [place]  . Atorvastatin     muscle pain  . Hydrocodone     itching  . Latex Rash  . Zolpidem Other (See Comments)    Sleep walk    I personally reviewed active problem list, medication list, allergies, family history, social history, health maintenance with the patient/caregiver today.   ROS  Constitutional: Negative for fever or weight change.  Respiratory: Negative for cough , positive and stable  shortness of breath.   Cardiovascular: Negative for chest pain or palpitations.  Gastrointestinal: Negative for abdominal pain ( likely from radiculitis)  no bowel changes.  Musculoskeletal: Positive  for gait problem and intermittent left  joint swelling.  Skin: Negative for rash.  Neurological: Negative for dizziness or headache.  No other specific complaints in a complete review of systems (except as listed in HPI above).  Objective  Vitals:   11/03/19 0926 11/03/19 1104  BP: (!) 150/70 (!) 150/90  Pulse: 93   Resp: 16   Temp: 97.7 F (36.5 C)   TempSrc: Temporal   SpO2: 96%   Weight: 253 lb 6.4 oz (114.9 kg)   Height: _0  (1.575 m)     Body mass index is 46.35 kg/m.  Physical Exam  Constitutional: Patient appears well-developed and well-nourished. Obese  No distress.  HEENT: head atraumatic, normocephalic, pupils equal and reactive to light Cardiovascular: Normal rate, regular rhythm and normal heart sounds.  No murmur heard. No BLE edema. Pulmonary/Chest: Effort normal and breath sounds normal. No respiratory distress. Abdominal: Soft.  There is no tenderness. Muscular Skeletal: pain with internal and external rotation of right hip, pain during palpation of right lower back, negative straight leg raise, uses a walker  Psychiatric: Patient has a normal mood and affect. behavior is normal. Judgment  and thought content normal.  Recent Results (from the past 2160 hour(s))  SARS CORONAVIRUS  2 (TAT 6-24 HRS) Nasopharyngeal Nasopharyngeal Swab     Status: None   Collection Time: 08/15/19 12:21 PM   Specimen: Nasopharyngeal Swab  Result Value Ref Range   SARS Coronavirus 2 NEGATIVE NEGATIVE    Comment: (NOTE) SARS-CoV-2 target nucleic acids are NOT DETECTED. The SARS-CoV-2 RNA is generally detectable in upper and lower respiratory specimens during the acute phase of infection. Negative results do not preclude SARS-CoV-2 infection, do not rule out co-infections with other pathogens, and should not be used as the sole basis for treatment or other patient management decisions. Negative results must be combined with clinical observations, patient history, and epidemiological information. The expected result is Negative. Fact Sheet for Patients: SugarRoll.be Fact Sheet for Healthcare Providers: https://www.woods-mathews.com/ This test is not yet approved or cleared by the [place] FDA and  has been authorized for detection and/or diagnosis of SARS-CoV-2 by FDA under an Emergency Use Authorization (EUA). This EUA will remain  in effect (meaning this test can be used) for the duration of the COVID-19 declaration under Section 56 4(b)(1) of the Act, 21 U.S.C. section 360bbb-3(b)(1), unless the authorization is terminated or revoked sooner. Performed at [facility], [place] [address], [place], [place] [address]   Glucose, capillary     Status: Abnormal   Collection Time: 08/19/19  7:40 AM  Result Value Ref Range   Glucose-Capillary 117 (H) 70 - 99 mg/dL  Surgical pathology     Status: None   Collection Time: 08/19/19  8:25 AM  Result Value Ref Range   SURGICAL PATHOLOGY      SURGICAL PATHOLOGY CASE: [id number] PATIENT: Lyda Perone Surgical Pathology Report     Specimen Submitted: A. Colon polyp, sigmoid; cbx  Clinical  History: Screening colonoscopy. Colon polyp.      DIAGNOSIS: A.  COLON POLYP, SIGMOID; COLD BIOPSY: - COLONIC MUCOSA WITH SMALL, BLAND INTRAMUCOSAL SPINDLE CELL PROLIFERATION CONSISTENT WITH LEIOMYOMA, BENIGN. - NEGATIVE FOR DYSPLASIA AND MALIGNANCY.  Comment: A limited panel of immunohistochemical stains was performed to further characterize the small bland spindle cell proliferation.  The spindle cells are positive with smooth muscle actin and negative with S100 and NSE.  This pattern of immunoreactivity supports the above diagnosis  IHC slides were prepared by [facility], [place], [place]. All controls stained appropriately.  This test was developed and its performance characteristics determined by [facility]. It has not been cleared or approved by the [place] Food and Drug Adminis tration. The FDA does not require this test to go through premarket FDA review. This test is used for clinical purposes. It should not be regarded as investigational or for research. This laboratory is certified under the Clinical Laboratory Improvement Amendments (CLIA) as qualified to perform high complexity clinical laboratory testing.   GROSS DESCRIPTION: A. Labeled: C BX polyp sigmoid colon Received: In formalin Tissue fragment(s): 2 Size: 0.1 and 0.2 cm Description: Tan soft tissue fragments Entirely submitted in 1 cassette.    Final Diagnosis performed by Quay Burow, MD.   Electronically signed 08/21/2019 1:48:24PM The electronic signature indicates that the named Attending Pathologist has evaluated the specimen Technical component performed at [facility], [address], [place], [place] [address] Lab: [phone] Dir: Rush Farmer, MD, MMM  Professional component performed at [facility], [facility], [place], [place], [place]  [address] Lab: [phone] Dir: Dellia Nims. Reuel Derby, MD   CBC with Differential/Platelet     Status: Abnormal    Collection Time: 10/14/19 11:42 AM  Result Value Ref Range   WBC 8.6 3.8 - 10.8 Thousand/uL   RBC 3.93 3.80 - 5.10 Million/uL   Hemoglobin 11.4 (L) 11.7 - 15.5 g/dL   HCT 33.7 (L) 35.0 - 45.0 %   MCV 85.8 80.0 - 100.0 fL   MCH 29.0 27.0 - 33.0 pg   MCHC 33.8 32.0 - 36.0 g/dL   RDW 15.0 11.0 - 15.0 %   Platelets 238 140 - 400 Thousand/uL   MPV 11.8 7.5 - 12.5 fL   Neutro Abs 5,263 1,500 - 7,800 cells/uL   Lymphs Abs 1,832 850 - 3,900 cells/uL   Absolute Monocytes 1,006 (H) 200 - 950 cells/uL   Eosinophils Absolute 396 15 - 500 cells/uL   Basophils Absolute 103 0 - 200 cells/uL   Neutrophils Relative % 61.2 %   Total Lymphocyte 21.3 %   Monocytes Relative 11.7 %   Eosinophils Relative 4.6 %   Basophils Relative 1.2 %  COMPLETE METABOLIC PANEL WITH GFR     Status: Abnormal   Collection Time: 10/14/19 11:42 AM  Result Value Ref Range   Glucose, Bld 135 (H) 65 - 99 mg/dL    Comment: .            Fasting reference interval . For someone without known diabetes, a glucose value >125 mg/dL indicates that they may have diabetes and this should be confirmed with a follow-up test. .    BUN 64 (H) 7 - 25 mg/dL   Creat 1.42 (H) 0.60 - 0.93 mg/dL    Comment: For patients >55 years of age, the reference limit for Creatinine is approximately 13% higher for people identified as African-American. .    GFR, Est Non African American 37 (L) > OR = 60 mL/min/1.91m   GFR, Est African American 43 (L) > OR = 60 mL/min/1.757m  BUN/Creatinine Ratio 45 (H) 6 - 22 (calc)   Sodium 141 135 - 146 mmol/L   Potassium 5.5 (H) 3.5 - 5.3 mmol/L   Chloride 102 98 - 110 mmol/L   CO2 29 20 - 32 mmol/L   Calcium 10.1 8.6 - 10.4 mg/dL   Total Protein 6.8 6.1 - 8.1 g/dL   Albumin 4.5 3.6 - 5.1 g/dL   Globulin 2.3 1.9 - 3.7 g/dL (calc)   AG Ratio 2.0 1.0 - 2.5 (calc)   Total Bilirubin 0.4 0.2 - 1.2 mg/dL   Alkaline phosphatase (APISO) 66 37 - 153 U/L   AST 21 10 - 35 U/L   ALT 20 6 - 29 U/L  Lipase      Status: Abnormal   Collection Time: 10/14/19 11:42 AM  Result Value Ref Range   Lipase 132 (H) 7 - 60 U/L  Urinalysis, Routine w reflex microscopic     Status: None   Collection Time: 10/14/19 11:42 AM  Result Value Ref Range   Color, Urine YELLOW YELLOW   APPearance CLEAR CLEAR   Specific Gravity, Urine 1.009 1.001 - 1.03   pH 6.0 5.0 - 8.0   Glucose, UA NEGATIVE NEGATIVE   Bilirubin Urine NEGATIVE NEGATIVE   Ketones, ur NEGATIVE NEGATIVE   Hgb urine dipstick NEGATIVE NEGATIVE   Protein, ur NEGATIVE NEGATIVE   Nitrite NEGATIVE NEGATIVE   Leukocytes,Ua NEGATIVE NEGATIVE  Urine Culture     Status: None   Collection Time: 10/14/19 11:42 AM   Specimen: Urine  Result Value Ref Range   MICRO NUMBER: [id number]  SPECIMEN QUALITY: Adequate    Sample Source URINE  STATUS: FINAL    Result: No Growth   H. pylori breath test     Status: None   Collection Time: 10/17/19  7:55 AM  Result Value Ref Range   H. pylori UBiT Negative Negative    Comment: (NOTE) Performed At: [facility] [place], [place] [id number] Rush Farmer MD AT:[id number]   Comprehensive metabolic panel     Status: Abnormal   Collection Time: 10/17/19  7:55 AM  Result Value Ref Range   Sodium 142 135 - 145 mmol/L   Potassium 4.1 3.5 - 5.1 mmol/L   Chloride 102 98 - 111 mmol/L   CO2 28 22 - 32 mmol/L   Glucose, Bld 72 70 - 99 mg/dL   BUN 59 (H) 8 - 23 mg/dL   Creatinine, Ser 1.29 (H) 0.44 - 1.00 mg/dL   Calcium 9.7 8.9 - 10.3 mg/dL   Total Protein 6.9 6.5 - 8.1 g/dL   Albumin 4.0 3.5 - 5.0 g/dL   AST 22 15 - 41 U/L   ALT 21 0 - 44 U/L   Alkaline Phosphatase 65 38 - 126 U/L   Total Bilirubin 0.6 0.3 - 1.2 mg/dL   GFR calc non Af Amer 42 (L) >60 mL/min   GFR calc Af Amer 49 (L) >60 mL/min   Anion gap 12 5 - 15    Comment: Performed at [facility], [place] [address] [place], [place] [address]  Lipase, blood     Status: None   Collection Time: 10/17/19  7:55 AM  Result  Value Ref Range   Lipase 16 11 - 51 U/L    Comment: Performed at [facility], [place], [place] [address]  POCT HgB A1C     Status: Abnormal   Collection Time: 11/03/19 10:09 AM  Result Value Ref Range   Hemoglobin A1C 6.0 (A) 4.0 - 5.6 %   HbA1c POC (<> result, manual entry)     HbA1c, POC (prediabetic range)     HbA1c, POC (controlled diabetic range)       PHQ2/9: Depression screen [facility] 2/9 11/03/2019 11/03/2019 10/14/2019 09/02/2019 07/31/2019  Decreased Interest 2 0 0 1 3  Down, Depressed, Hopeless 1 0 0 1 3  PHQ - 2 Score 3 0 0 2 6  Altered sleeping 2 0 0 2 3  Tired, decreased energy 3 0 0 3 3  Change in appetite 2 0 0 2 3  Feeling bad or failure about yourself  1 0 0 2 3  Trouble concentrating 1 0 0 0 0  Moving slowly or fidgety/restless 0 0 0 0 0  Suicidal thoughts 0 0 0 0 0  PHQ-9 Score 12 0 0 11 18  Difficult doing work/chores Very difficult - Not difficult at all Somewhat difficult Very difficult  Some recent data might be hidden    phq 9 is positive   Fall Risk: Fall Risk  10/14/2019 09/02/2019 07/31/2019 05/07/2019 04/14/2019  Falls in the past year? 0 0 0 0 0  Comment - - - - -  Number falls in past yr: 0 0 0 0 0  Injury with Fall? 0 0 0 0 0  Risk Factor Category  - - - - -  Comment - - - - -  Risk for fall due to : - - - - -  Risk for fall due to: Comment - - - - -  Follow up - - Falls prevention discussed - -  Assessment & Plan  1. Diabetic polyneuropathy associated with type 2 diabetes mellitus ([name])  - POCT HgB A1C  2. Insomnia due to other mental disorder  - traZODone (DESYREL) 50 MG tablet; Take 1 tablet (50 mg total) by mouth at bedtime as needed for sleep.  Dispense: 90 tablet; Refill: 1  3. Essential hypertension  bp is high today, she is in pain, advised to hold off on filling modafinil until she returns for bp recheck or checks it at home, bp must be controlled before starting modafiinil   4. Obstructive sleep  apnea  - modafinil (PROVIGIL) 100 MG tablet; Take 1 tablet (100 mg total) by mouth daily.  Dispense: 30 tablet; Refill: 0  5. Morbid obesity with BMI of 40.0-44.9, adult ([place])  - MR Lumbar Spine Wo Contrast; Future  6. Back pain with right-sided radiculopathy  Getting MRI as recommended by Dr. Vicente Males, but also would like to check for hip OA first  7. Atherosclerosis of aorta ([name])   Continue statin therapy   8. MDD (major depressive disorder), recurrent episode, moderate ([name])  - sertraline (ZOLOFT) 100 MG tablet; Take 0.5 tablets (50 mg total) by mouth daily.  Dispense: 90 tablet; Refill: 0  9. Right hip pain  Checking X-ray hip  10. Numerous skin moles  - Ambulatory referral to Dermatology  11. Other intervertebral disc degeneration, lumbar region  - MR Lumbar Spine Wo Contrast; Future

## 2019-11-06 ENCOUNTER — Ambulatory Visit (INDEPENDENT_AMBULATORY_CARE_PROVIDER_SITE_OTHER): Payer: Medicare HMO | Admitting: Psychology

## 2019-11-06 DIAGNOSIS — F411 Generalized anxiety disorder: Secondary | ICD-10-CM | POA: Diagnosis not present

## 2019-11-06 DIAGNOSIS — F331 Major depressive disorder, recurrent, moderate: Secondary | ICD-10-CM

## 2019-11-07 ENCOUNTER — Ambulatory Visit: Payer: Medicare HMO | Attending: Internal Medicine

## 2019-11-07 DIAGNOSIS — Z23 Encounter for immunization: Secondary | ICD-10-CM | POA: Insufficient documentation

## 2019-11-07 NOTE — Progress Notes (Signed)
   Covid-19 Vaccination Clinic  Name:  Summer [name]    MRN: [id number] DOB: [birth year]  11/07/2019  [name] was observed post Covid-19 immunization for 15 minutes without incidence. She was provided with Vaccine Information Sheet and instruction to access the V-Safe system.   [name] was instructed to call 911 with any severe reactions post vaccine: Marland Kitchen Difficulty breathing  . Swelling of your face and throat  . A fast heartbeat  . A bad rash all over your body  . Dizziness and weakness    Immunizations Administered    Name Date Dose VIS Date Route   Pfizer COVID-19 Vaccine 11/07/2019 11:03 AM 0.3 mL 08/22/2019 Intramuscular   Manufacturer: [place]   Lot: [id number]   [place]: [id number]

## 2019-11-09 ENCOUNTER — Ambulatory Visit: Payer: Self-pay

## 2019-11-09 ENCOUNTER — Other Ambulatory Visit: Payer: Self-pay

## 2019-11-09 ENCOUNTER — Ambulatory Visit
Admission: RE | Admit: 2019-11-09 | Discharge: 2019-11-09 | Disposition: A | Discharge status: Home / Self Care | Payer: Medicare HMO | Source: Ambulatory Visit | Attending: Family Medicine | Admitting: Family Medicine

## 2019-11-09 DIAGNOSIS — M48061 Spinal stenosis, lumbar region without neurogenic claudication: Secondary | ICD-10-CM | POA: Diagnosis not present

## 2019-11-09 DIAGNOSIS — M5136 Other intervertebral disc degeneration, lumbar region: Secondary | ICD-10-CM | POA: Insufficient documentation

## 2019-11-09 DIAGNOSIS — Z6841 Body Mass Index (BMI) 40.0 and over, adult: Secondary | ICD-10-CM | POA: Diagnosis not present

## 2019-11-10 ENCOUNTER — Telehealth: Payer: Self-pay

## 2019-11-10 ENCOUNTER — Telehealth: Payer: Self-pay | Admitting: Family Medicine

## 2019-11-10 DIAGNOSIS — M5410 Radiculopathy, site unspecified: Secondary | ICD-10-CM

## 2019-11-10 DIAGNOSIS — M48062 Spinal stenosis, lumbar region with neurogenic claudication: Secondary | ICD-10-CM

## 2019-11-10 NOTE — Telephone Encounter (Signed)
Spoke with patient and she had questions on the arthritis in her spine and stenosis. Patient states she has never seen  a Neurosurgeon before but has heard good things about the ones in [place] and best contact nyuber is [phone] (H).

## 2019-11-10 NOTE — Telephone Encounter (Signed)
Patient called and was read MR note by Dr Ancil Boozer 11/09/19. Ms Summer [name] had multiple questions about her result. Call was transferred to [place] at the office for follow up. Ms Summer [name] states she has no preference in neurosurgeons .  She will see anyone recommended by Dr Ancil Boozer.

## 2019-11-10 NOTE — Telephone Encounter (Signed)
Prior Authorization was initiated for Modafinil and approved. The medication is approved through 09/10/2020.

## 2019-11-10 NOTE — Telephone Encounter (Signed)
Called pt. In regard to x-ray result . States she has already talked with Tiffany in the practice.

## 2019-11-11 ENCOUNTER — Ambulatory Visit: Payer: Medicare HMO | Admitting: Psychology

## 2019-11-12 NOTE — Telephone Encounter (Signed)
Patient checking on the status of referral mentioned below, patient would like a follow up call to discuss

## 2019-11-20 ENCOUNTER — Ambulatory Visit (INDEPENDENT_AMBULATORY_CARE_PROVIDER_SITE_OTHER): Payer: Medicare HMO | Admitting: Psychology

## 2019-11-20 DIAGNOSIS — F411 Generalized anxiety disorder: Secondary | ICD-10-CM | POA: Diagnosis not present

## 2019-11-20 DIAGNOSIS — F331 Major depressive disorder, recurrent, moderate: Secondary | ICD-10-CM | POA: Diagnosis not present

## 2019-11-25 DIAGNOSIS — M5136 Other intervertebral disc degeneration, lumbar region: Secondary | ICD-10-CM | POA: Diagnosis not present

## 2019-11-27 ENCOUNTER — Telehealth: Payer: Self-pay | Admitting: Family Medicine

## 2019-11-27 ENCOUNTER — Other Ambulatory Visit: Payer: Self-pay | Admitting: Family Medicine

## 2019-11-27 DIAGNOSIS — E1169 Type 2 diabetes mellitus with other specified complication: Secondary | ICD-10-CM

## 2019-11-28 NOTE — Telephone Encounter (Signed)
Patient called to ask why her medication for Cymbalta was denied.  She stated that she has been taking it for a while.  Please advise and call patient to discuss at [phone]

## 2019-12-01 ENCOUNTER — Ambulatory Visit (INDEPENDENT_AMBULATORY_CARE_PROVIDER_SITE_OTHER): Payer: Medicare HMO | Admitting: Psychology

## 2019-12-01 DIAGNOSIS — F411 Generalized anxiety disorder: Secondary | ICD-10-CM

## 2019-12-01 DIAGNOSIS — F331 Major depressive disorder, recurrent, moderate: Secondary | ICD-10-CM | POA: Diagnosis not present

## 2019-12-02 ENCOUNTER — Ambulatory Visit: Payer: Medicare HMO | Attending: Internal Medicine

## 2019-12-02 DIAGNOSIS — Z23 Encounter for immunization: Secondary | ICD-10-CM

## 2019-12-02 NOTE — Progress Notes (Signed)
   Covid-19 Vaccination Clinic  Name:  Summer [name]    MRN: [id number] DOB: 24-Jul-1951  12/02/2019  Summer [name] was observed post Covid-19 immunization for 15 minutes without incident. She was provided with Vaccine Information Sheet and instruction to access the V-Safe system.   Summer [name] was instructed to call 911 with any severe reactions post vaccine: Marland Kitchen Difficulty breathing  . Swelling of face and throat  . A fast heartbeat  . A bad rash all over body  . Dizziness and weakness   Immunizations Administered    Name Date Dose VIS Date Route   Pfizer COVID-19 Vaccine 12/02/2019  3:48 PM 0.3 mL 08/22/2019 Intramuscular   Manufacturer: [place]   Lot: [id number]   [place]: [id number]

## 2019-12-05 ENCOUNTER — Ambulatory Visit: Payer: Medicare HMO | Admitting: Family Medicine

## 2019-12-05 DIAGNOSIS — M4726 Other spondylosis with radiculopathy, lumbar region: Secondary | ICD-10-CM | POA: Diagnosis not present

## 2019-12-05 DIAGNOSIS — M48062 Spinal stenosis, lumbar region with neurogenic claudication: Secondary | ICD-10-CM | POA: Diagnosis not present

## 2019-12-05 DIAGNOSIS — M5416 Radiculopathy, lumbar region: Secondary | ICD-10-CM | POA: Diagnosis not present

## 2019-12-05 DIAGNOSIS — M5136 Other intervertebral disc degeneration, lumbar region: Secondary | ICD-10-CM | POA: Diagnosis not present

## 2019-12-12 ENCOUNTER — Other Ambulatory Visit: Payer: Self-pay | Admitting: Family Medicine

## 2019-12-12 DIAGNOSIS — F99 Mental disorder, not otherwise specified: Secondary | ICD-10-CM

## 2019-12-12 DIAGNOSIS — F5105 Insomnia due to other mental disorder: Secondary | ICD-10-CM

## 2019-12-15 ENCOUNTER — Ambulatory Visit: Payer: Medicare HMO | Admitting: Psychology

## 2019-12-17 ENCOUNTER — Ambulatory Visit: Payer: Medicare HMO | Admitting: Family Medicine

## 2019-12-18 ENCOUNTER — Other Ambulatory Visit: Payer: Self-pay | Admitting: Family Medicine

## 2019-12-18 DIAGNOSIS — F5105 Insomnia due to other mental disorder: Secondary | ICD-10-CM

## 2019-12-18 MED ORDER — TRAZODONE HCL 50 MG PO TABS: 50.0000 mg | ORAL_TABLET | Freq: Every evening | ORAL | 1 refills | Status: DC | PRN

## 2019-12-18 NOTE — Telephone Encounter (Signed)
Requested Prescriptions  Pending Prescriptions Disp Refills  . traZODone (DESYREL) 50 MG tablet 90 tablet 1    Sig: Take 1 tablet (50 mg total) by mouth at bedtime as needed for sleep.     Psychiatry: Antidepressants - Serotonin Modulator Passed - 12/18/2019  1:54 PM      Passed - Completed PHQ-2 or PHQ-9 in the last 360 days.      Passed - Valid encounter within last 6 months    Recent Outpatient Visits          1 month ago Diabetic polyneuropathy associated with type 2 diabetes mellitus [facility])   [facility] Steele Sizer, MD   2 months ago Skin pain   [facility] Delsa Grana, PA-C   2 months ago Abdominal pain, unspecified abdominal location   [facility] Delsa Grana, PA-C   3 months ago Morbid (severe) obesity due to excess calories [facility])   [facility] Steele Sizer, MD   5 months ago Morbid (severe) obesity due to excess calories [facility])   [facility] Steele Sizer, MD      Future Appointments            In 1 week Steele Sizer, MD [facility], [place]   In 1 month Ralene Bathe, MD [place]   In 1 month End, Harrell Gave, MD [facility], [place]   In 7 months  [facility], [place]  . traZODone (DESYREL) 100 MG tablet [Pharmacy Med Name: traZODone HCl 100 MG Oral Tablet] 30 tablet 0    Sig: TAKE 1/2 (ONE-HALF) TABLET BY MOUTH NIGHTLY AT BEDTIME AS NEEDED FOR SLEEP     Psychiatry: Antidepressants - Serotonin Modulator Passed - 12/18/2019  1:54 PM      Passed - Completed PHQ-2 or PHQ-9 in the last 360 days.      Passed - Valid encounter within last 6 months    Recent Outpatient Visits          1 month ago Diabetic polyneuropathy associated with type 2 diabetes mellitus [facility])   [facility] Steele Sizer, MD   2 months ago Skin pain   [facility] Delsa Grana, PA-C   2 months ago Abdominal pain, unspecified abdominal location   [facility] Delsa Grana, PA-C   3 months ago Morbid (severe) obesity due to excess calories [facility])   [facility] Steele Sizer, MD   5 months ago Morbid (severe) obesity due to excess calories [facility])   [facility] Steele Sizer, MD      Future Appointments            In 1 week Steele Sizer, MD [facility], [place]   In 1 month Ralene Bathe, MD [place]   In 1 month End, Harrell Gave, MD [facility], [place]   In 7 months  [facility], [facility]

## 2019-12-18 NOTE — Telephone Encounter (Addendum)
Patient requesting traZODone (DESYREL) 50 MG tablet not 100mg , pharmacy sent in wrong request. Informed patient please allow 48 to 72 hour turn around time  [place], [address] Phone:  [phone]  Fax:  [phone]

## 2019-12-18 NOTE — Addendum Note (Signed)
Addended by: Valli Glance F on: 12/18/2019 01:54 PM   Modules accepted: Orders

## 2019-12-23 ENCOUNTER — Ambulatory Visit (INDEPENDENT_AMBULATORY_CARE_PROVIDER_SITE_OTHER): Payer: Medicare HMO | Admitting: Psychology

## 2019-12-23 DIAGNOSIS — F411 Generalized anxiety disorder: Secondary | ICD-10-CM | POA: Diagnosis not present

## 2019-12-23 DIAGNOSIS — F331 Major depressive disorder, recurrent, moderate: Secondary | ICD-10-CM | POA: Diagnosis not present

## 2019-12-24 ENCOUNTER — Ambulatory Visit: Payer: Self-pay | Admitting: Dermatology

## 2019-12-26 ENCOUNTER — Telehealth: Payer: Self-pay | Admitting: Family Medicine

## 2019-12-26 MED ORDER — VASCEPA 1 G PO CAPS: 2.0000 g | ORAL_CAPSULE | Freq: Two times a day (BID) | ORAL | 1 refills | Status: DC

## 2019-12-26 NOTE — Telephone Encounter (Signed)
Pt called tstaing that her medication Icosapent Ethyl (VASCEPA) 1 g CAPS costs too much in the pharmacy and is requesting to have a new medication sent in. Please advise.        [facility], [place]  [address] [place] [address]  Phone: [phone] Fax: [phone]  Open 24 hours

## 2019-12-27 ENCOUNTER — Other Ambulatory Visit: Payer: Self-pay | Admitting: Family Medicine

## 2019-12-27 DIAGNOSIS — I10 Essential (primary) hypertension: Secondary | ICD-10-CM

## 2019-12-27 DIAGNOSIS — J4520 Mild intermittent asthma, uncomplicated: Secondary | ICD-10-CM

## 2019-12-27 DIAGNOSIS — E039 Hypothyroidism, unspecified: Secondary | ICD-10-CM

## 2019-12-30 ENCOUNTER — Ambulatory Visit (INDEPENDENT_AMBULATORY_CARE_PROVIDER_SITE_OTHER): Payer: Medicare HMO | Admitting: Family Medicine

## 2019-12-30 ENCOUNTER — Encounter: Payer: Self-pay | Admitting: Family Medicine

## 2019-12-30 ENCOUNTER — Other Ambulatory Visit: Payer: Self-pay

## 2019-12-30 VITALS — BP 120/70 | HR 85 | Temp 97.3°F | Resp 16 | Ht 62.0 in | Wt 251.9 lb

## 2019-12-30 DIAGNOSIS — L0100 Impetigo, unspecified: Secondary | ICD-10-CM

## 2019-12-30 DIAGNOSIS — E039 Hypothyroidism, unspecified: Secondary | ICD-10-CM | POA: Diagnosis not present

## 2019-12-30 DIAGNOSIS — G4733 Obstructive sleep apnea (adult) (pediatric): Secondary | ICD-10-CM | POA: Diagnosis not present

## 2019-12-30 DIAGNOSIS — F331 Major depressive disorder, recurrent, moderate: Secondary | ICD-10-CM | POA: Diagnosis not present

## 2019-12-30 DIAGNOSIS — E785 Hyperlipidemia, unspecified: Secondary | ICD-10-CM | POA: Diagnosis not present

## 2019-12-30 DIAGNOSIS — E1169 Type 2 diabetes mellitus with other specified complication: Secondary | ICD-10-CM

## 2019-12-30 DIAGNOSIS — M48062 Spinal stenosis, lumbar region with neurogenic claudication: Secondary | ICD-10-CM | POA: Diagnosis not present

## 2019-12-30 MED ORDER — SERTRALINE HCL 100 MG PO TABS: 150.0000 mg | ORAL_TABLET | Freq: Every day | ORAL | 0 refills | Status: DC

## 2019-12-30 MED ORDER — AMOXICILLIN 500 MG PO TABS: 500.0000 mg | ORAL_TABLET | Freq: Two times a day (BID) | ORAL | 0 refills | Status: DC

## 2019-12-30 MED ORDER — MODAFINIL 100 MG PO TABS: 100.0000 mg | ORAL_TABLET | Freq: Every day | ORAL | 0 refills | Status: DC

## 2019-12-30 MED ORDER — OMEGA-3-ACID ETHYL ESTERS 1 G PO CAPS: 2.0000 g | ORAL_CAPSULE | Freq: Two times a day (BID) | ORAL | 0 refills | Status: DC

## 2019-12-30 NOTE — Addendum Note (Signed)
Addended by: Steele Sizer F on: 12/30/2019 12:07 PM   Modules accepted: Orders

## 2019-12-30 NOTE — Progress Notes (Addendum)
Name: Summer [name]   MRN: [id number]    DOB: 1951-04-26   Date:12/30/2019       Progress Note  Subjective  Chief Complaint  Chief Complaint  Patient presents with  . Depression  . Rash    She has had an allegeric reaction to something causing her face to break out.     HPI  Advanced diffuse lumbar disc and facet degeneration causing severe spinal stenosis from L1-2 to L4-5 : she is now seeing Dr. Lacinda Axon  - neurosurgeon / [facility]. Reviewed note from Dr. Lacinda Axon, , he advised conservative measures with Dr. Chasniss/psyatrist . She has been scheduled for a Transforaminal ESI , was given tramadol , advised to continue Tylenol and Lyrica 300 mg BID . Today she states pain is on right lower back, occasionally has numbness on left lateral food and also intermittent numbness on right lateral thigh. Taking Tramadol and has noticed a facial rash but seems to help with the pain. Advised to try holding tramadol to see if rash resolves   Rash on face: she states since started on Tramadol she has noticed itching a prickly sensation on her face that goes all the way to her scalp. She has been picking on it and has sores on her face ( specially area covered by mask) . Advised to try holding Tramadol to see if resolves. Try taking loratadine to avoid sedation   Hypothyroidism: she has noticed her hair is getting thin again. She has lumps of hair in her shower again , we will recheck TSH level today  Dyslipidemia/DM: she has follow up with Dr. Honor Junes also needs to see podiatrist for foot exam. She states Vascepa was too expensive, we will try Lovaza but if not cheaper she agrees on going to [facility] and getting it through [facility]  MDD: she is currently taking Zoloft 100 mg and also duloxetine 30 mg, advised to stop duloxetine since she would like to stay on zoloft, we will resume mondafinil since it helped with energy and she has OSA. She still wants to have bariatric surgery, waiting for  approval by therapist, she is concerned about her health and cannot wait to start feeling better. She is helping out a friend by working at [employer] a couple of days a week and seems to help her mood     Patient Active Problem List   Diagnosis Date Noted  . Preop cardiovascular exam 07/18/2019  . Major depressive disorder, recurrent episode, moderate ([place]) 04/04/2019  . Left arm weakness 04/03/2019  . (HFpEF) heart failure with preserved ejection fraction ([place]) 08/31/2017  . Moderate persistent asthma 08/31/2017  . Hyperlipidemia due to type 2 diabetes mellitus ([place]) 06/14/2017  . Dyslipidemia associated with type 2 diabetes mellitus ([place]) 08/18/2016  . Charcot foot due to diabetes mellitus ([place]) 02/22/2016  . Acquired abduction deformity of foot 07/12/2015  . Osteoarthritis of subtalar joint 07/12/2015  . Poorly controlled type 2 diabetes mellitus with neuropathy ([place]) 07/12/2015  . Arthritis of foot, degenerative 07/12/2015  . Carpal tunnel syndrome 04/17/2015  . Chronic constipation 04/17/2015  . Insomnia, persistent 04/17/2015  . Stage 3 chronic kidney disease 04/17/2015  . Decreased exercise tolerance 04/17/2015  . Diabetes mellitus with polyneuropathy ([place]) 04/17/2015  . Gastro-esophageal reflux disease without esophagitis 04/17/2015  . Bursitis, trochanteric 04/17/2015  . Cephalalgia 04/17/2015  . Benign hypertension 04/17/2015  . Adult hypothyroidism 04/17/2015  . Hearing loss 04/17/2015  . Chronic recurrent major depressive disorder ([place]) 04/17/2015  .  Neurogenic claudication 04/17/2015  . Morbid obesity ([place]) 04/17/2015  . Hypo-ovarianism 04/17/2015  . Perennial allergic rhinitis with seasonal variation 04/17/2015  . Acne erythematosa 04/17/2015  . Dyskinesia, tardive 04/17/2015  . Memory loss 04/17/2015  . Impingement syndrome of shoulder 04/17/2015  . Dermatitis, stasis 04/17/2015  . Obstructive sleep apnea 05/14/2014  . Dyspnea 05/06/2014  . Hyperlipidemia  02/06/2012  . LBP (low back pain) 09/16/2008    Past Surgical History:  Procedure Laterality Date  . ANKLE SURGERY Left approx Jan 2018  . CATARACT EXTRACTION  01/2011   right  . COLONOSCOPY WITH PROPOFOL N/A 08/19/2019   Procedure: COLONOSCOPY WITH PROPOFOL;  Surgeon: Jonathon Bellows, MD;  Location: [facility] END[facility]COPY;  Service: Gastroenterology;  Laterality: N/A;  . eye lid surgery  2013   bilateral  . FOOT SURGERY    . NECK SURGERY    . TUBAL LIGATION    . VAGINAL HYSTERECTOMY  1989    Family History  Problem Relation Age of Onset  . Aneurysm Mother   . Aortic aneurysm Mother   . Heart attack Maternal Grandfather   . Diabetes Maternal Grandfather     Social History   Tobacco Use  . Smoking status: Never Smoker  . Smokeless tobacco: Never Used  Substance Use Topics  . Alcohol use: No    Alcohol/week: 0.0 standard drinks     Current Outpatient Medications:  .  ACCU-CHEK SOFTCLIX LANCETS lancets, , Disp: , Rfl:  .  acetaminophen (TYLENOL 8 HOUR) 650 MG CR tablet, Take 1 tablet (650 mg total) by mouth every 8 (eight) hours as needed for pain., Disp: 90 tablet, Rfl: 2 .  albuterol (PROVENTIL HFA) 108 (90 Base) MCG/ACT inhaler, Inhale 2 puffs into the lungs 2 (two) times daily as needed for shortness of breath. , Disp: , Rfl:  .  Alcohol Swabs (B-D SINGLE USE SWABS REGULAR) PADS, , Disp: , Rfl:  .  ASPIRIN LOW DOSE 81 MG EC tablet, TAKE 1 TABLET (81 MG TOTAL) BY MOUTH DAILY., Disp: 30 tablet, Rfl: 0 .  Blood Glucose Monitoring Suppl (ACCU-CHEK AVIVA PLUS) w/Device KIT, , Disp: , Rfl:  .  Cyanocobalamin (VITAMIN B12 PO), Take 1 tablet by mouth daily. , Disp: , Rfl:  .  furosemide (LASIX) 20 MG tablet, Take 1 tablet by mouth daily., Disp: 90 tablet, Rfl: 0 .  insulin NPH-regular Human (70-30) 100 UNIT/ML injection, Inject 100 Units into the skin 2 (two) times daily with a meal., Disp: , Rfl:  .  irbesartan-hydrochlorothiazide (AVALIDE) 300-12.5 MG tablet, Take 1 tablet by mouth  daily., Disp: 90 tablet, Rfl: 1 .  levothyroxine (SYNTHROID) 25 MCG tablet, Take 1 tablet by mouth daily except, take 2 tablets by mouth on Sunday., Disp: 100 tablet, Rfl: 0 .  lidocaine (XYLOCAINE) 5 % ointment, Apply 1 application topically 3 (three) times daily as needed. To area of skin pain, Disp: 30 g, Rfl: 0 .  metFORMIN (GLUCOPHAGE-XR) 500 MG 24 hr tablet, Take 500 mg by mouth. 2 tablets daily (total of 1000 mg daily), Disp: , Rfl:  .  metoprolol tartrate (LOPRESSOR) 25 MG tablet, Take 1 tablet by mouth twice daily., Disp: 180 tablet, Rfl: 0 .  Miconazole Nitrate 2 % POWD, Apply to skin folds twice a day, Disp: 100 g, Rfl: 0 .  Misc Natural Products (OSTEO BI-FLEX ADV JOINT SHIELD PO), Take by mouth 2 (two) times daily. , Disp: , Rfl:  .  modafinil (PROVIGIL) 100 MG tablet, Take 1 tablet (100 mg  total) by mouth daily., Disp: 90 tablet, Rfl: 0 .  montelukast (SINGULAIR) 10 MG tablet, Take 1 tablet by mouth at bedtime., Disp: 90 tablet, Rfl: 0 .  Omega-3 Fatty Acids (FISH OIL) 1000 MG CAPS, Take 2,000 mg by mouth daily. , Disp: , Rfl:  .  omeprazole (PRILOSEC) 40 MG capsule, Take 1 capsule by mouth daily., Disp: 90 capsule, Rfl: 0 .  ondansetron (ZOFRAN ODT) 4 MG disintegrating tablet, Take 1 tablet (4 mg total) by mouth every 8 (eight) hours as needed for nausea or vomiting., Disp: 10 tablet, Rfl: 0 .  Potassium 99 MG TABS, Take by mouth., Disp: , Rfl:  .  pregabalin (LYRICA) 300 MG capsule, Take 300 mg by mouth 2 (two) times a day., Disp: , Rfl:  .  Probiotic Product (PROBIOTIC-10 PO), Take by mouth., Disp: , Rfl:  .  RELION INSULIN SYRINGE 1ML/31G 31G X 5/16" 1 ML MISC, , Disp: , Rfl:  .  rosuvastatin (CRESTOR) 20 MG tablet, Take 1 tablet by mouth daily., Disp: 90 tablet, Rfl: 1 .  sertraline (ZOLOFT) 100 MG tablet, Take 1.5 tablets (150 mg total) by mouth daily., Disp: 135 tablet, Rfl: 0 .  traMADol (ULTRAM) 50 MG tablet, Take 1 tablet by mouth twice daily as needed, Disp: , Rfl:  .   traZODone (DESYREL) 50 MG tablet, Take 1 tablet (50 mg total) by mouth at bedtime as needed for sleep., Disp: 90 tablet, Rfl: 1 .  vitamin C (ASCORBIC ACID) 500 MG tablet, Take 500 mg by mouth 2 (two) times daily. , Disp: , Rfl:  .  VITAMIN D, CHOLECALCIFEROL, PO, Take 1,000 Units by mouth daily. , Disp: , Rfl:  .  amLODipine (NORVASC) 5 MG tablet, Take 1 tablet (5 mg total) by mouth daily., Disp: 90 tablet, Rfl: 2 .  omega-3 acid ethyl esters (LOVAZA) 1 g capsule, Take 2 capsules (2 g total) by mouth 2 (two) times daily., Disp: 360 capsule, Rfl: 0  Allergies  Allergen Reactions  . Codeine Other (See Comments)    "TRIPPED OUT"  DIDN'T LIKE THE [place]  . Atorvastatin     muscle pain  . Hydrocodone     itching  . Latex Rash  . Zolpidem Other (See Comments)    Sleep walk    I personally reviewed active problem list, medication list, allergies, family history, social history, health maintenance with the patient/caregiver today.   ROS  Constitutional: Negative for fever or weight change.  Respiratory: Negative for cough , positive for shortness of breath.   Cardiovascular: Negative for chest pain or palpitations.  Gastrointestinal: Negative for abdominal pain, no bowel changes.  Musculoskeletal: Positive  for gait problem but no effusion  Neurological: Negative for dizziness or headache.  No other specific complaints in a complete review of systems (except as listed in HPI above).  Objective  Vitals:   12/30/19 1125  BP: 120/70  Pulse: 85  Resp: 16  Temp: (!) 97.3 F (36.3 C)  TempSrc: Temporal  SpO2: 96%  Weight: 251 lb 14.4 oz (114.3 kg)  Height: _0  (1.575 m)    Body mass index is 46.07 kg/m.  Physical Exam  Constitutional: Patient appears well-developed and well-nourished. Obese  No distress.  HEENT: head atraumatic, normocephalic, pupils equal and reactive to light Cardiovascular: Normal rate, regular rhythm and normal heart sounds.  No murmur heard. Trace   BLE edema. Pulmonary/Chest: Effort normal and breath sounds normal. No respiratory distress. Abdominal: Soft.  There is no  tenderness. Psychiatric: Patient has a normal mood and affect. behavior is normal. Judgment and thought content normal. Skin: multiple erythematous excoriations on face, some of them oozing, likely secondary infection, she picks on her face  Muscular Skeletal: pain during palpation of right lower back  Recent Results (from the past 2160 hour(s))  CBC with Differential/Platelet     Status: Abnormal   Collection Time: 10/14/19 11:42 AM  Result Value Ref Range   WBC 8.6 3.8 - 10.8 Thousand/uL   RBC 3.93 3.80 - 5.10 Million/uL   Hemoglobin 11.4 (L) 11.7 - 15.5 g/dL   HCT 33.7 (L) 35.0 - 45.0 %   MCV 85.8 80.0 - 100.0 fL   MCH 29.0 27.0 - 33.0 pg   MCHC 33.8 32.0 - 36.0 g/dL   RDW 15.0 11.0 - 15.0 %   Platelets 238 140 - 400 Thousand/uL   MPV 11.8 7.5 - 12.5 fL   Neutro Abs 5,263 1,500 - 7,800 cells/uL   Lymphs Abs 1,832 850 - 3,900 cells/uL   Absolute Monocytes 1,006 (H) 200 - 950 cells/uL   Eosinophils Absolute 396 15 - 500 cells/uL   Basophils Absolute 103 0 - 200 cells/uL   Neutrophils Relative % 61.2 %   Total Lymphocyte 21.3 %   Monocytes Relative 11.7 %   Eosinophils Relative 4.6 %   Basophils Relative 1.2 %  COMPLETE METABOLIC PANEL WITH GFR     Status: Abnormal   Collection Time: 10/14/19 11:42 AM  Result Value Ref Range   Glucose, Bld 135 (H) 65 - 99 mg/dL    Comment: .            Fasting reference interval . For someone without known diabetes, a glucose value >125 mg/dL indicates that they may have diabetes and this should be confirmed with a follow-up test. .    BUN 64 (H) 7 - 25 mg/dL   Creat 1.42 (H) 0.60 - 0.93 mg/dL    Comment: For patients >55 years of age, the reference limit for Creatinine is approximately 13% higher for people identified as African-American. .    GFR, Est Non African American 37 (L) > OR = 60 mL/min/1.70m    GFR, Est African American 43 (L) > OR = 60 mL/min/1.729m  BUN/Creatinine Ratio 45 (H) 6 - 22 (calc)   Sodium 141 135 - 146 mmol/L   Potassium 5.5 (H) 3.5 - 5.3 mmol/L   Chloride 102 98 - 110 mmol/L   CO2 29 20 - 32 mmol/L   Calcium 10.1 8.6 - 10.4 mg/dL   Total Protein 6.8 6.1 - 8.1 g/dL   Albumin 4.5 3.6 - 5.1 g/dL   Globulin 2.3 1.9 - 3.7 g/dL (calc)   AG Ratio 2.0 1.0 - 2.5 (calc)   Total Bilirubin 0.4 0.2 - 1.2 mg/dL   Alkaline phosphatase (APISO) 66 37 - 153 U/L   AST 21 10 - 35 U/L   ALT 20 6 - 29 U/L  Lipase     Status: Abnormal   Collection Time: 10/14/19 11:42 AM  Result Value Ref Range   Lipase 132 (H) 7 - 60 U/L  Urinalysis, Routine w reflex microscopic     Status: None   Collection Time: 10/14/19 11:42 AM  Result Value Ref Range   Color, Urine YELLOW YELLOW   APPearance CLEAR CLEAR   Specific Gravity, Urine 1.009 1.001 - 1.03   pH 6.0 5.0 - 8.0   Glucose, UA NEGATIVE NEGATIVE   Bilirubin Urine NEGATIVE NEGATIVE  Ketones, ur NEGATIVE NEGATIVE   Hgb urine dipstick NEGATIVE NEGATIVE   Protein, ur NEGATIVE NEGATIVE   Nitrite NEGATIVE NEGATIVE   Leukocytes,Ua NEGATIVE NEGATIVE  Urine Culture     Status: None   Collection Time: 10/14/19 11:42 AM   Specimen: Urine  Result Value Ref Range   MICRO NUMBER: [id number]    SPECIMEN QUALITY: Adequate    Sample Source URINE    STATUS: FINAL    Result: No Growth   H. pylori breath test     Status: None   Collection Time: 10/17/19  7:55 AM  Result Value Ref Range   H. pylori UBiT Negative Negative    Comment: (NOTE) Performed At: [facility] [place], [place] [id number] Rush Farmer MD [id number]   Comprehensive metabolic panel     Status: Abnormal   Collection Time: 10/17/19  7:55 AM  Result Value Ref Range   Sodium 142 135 - 145 mmol/L   Potassium 4.1 3.5 - 5.1 mmol/L   Chloride 102 98 - 111 mmol/L   CO2 28 22 - 32 mmol/L   Glucose, Bld 72 70 - 99 mg/dL   BUN 59 (H) 8 - 23 mg/dL    Creatinine, Ser 1.29 (H) 0.44 - 1.00 mg/dL   Calcium 9.7 8.9 - 10.3 mg/dL   Total Protein 6.9 6.5 - 8.1 g/dL   Albumin 4.0 3.5 - 5.0 g/dL   AST 22 15 - 41 U/L   ALT 21 0 - 44 U/L   Alkaline Phosphatase 65 38 - 126 U/L   Total Bilirubin 0.6 0.3 - 1.2 mg/dL   GFR calc non Af Amer 42 (L) >60 mL/min   GFR calc Af Amer 49 (L) >60 mL/min   Anion gap 12 5 - 15    Comment: Performed at [facility], [address] [place], [place] [address]  Lipase, blood     Status: None   Collection Time: 10/17/19  7:55 AM  Result Value Ref Range   Lipase 16 11 - 51 U/L    Comment: Performed at [facility], [address] [place],  [address]  POCT HgB A1C     Status: Abnormal   Collection Time: 11/03/19 10:09 AM  Result Value Ref Range   Hemoglobin A1C 6.0 (A) 4.0 - 5.6 %   HbA1c POC (<> result, manual entry)     HbA1c, POC (prediabetic range)     HbA1c, POC (controlled diabetic range)       PHQ2/9: Depression screen [facility] 2/9 12/30/2019 11/03/2019 11/03/2019 10/14/2019 09/02/2019  Decreased Interest 0 2 0 0 1  Down, Depressed, Hopeless 0 1 0 0 1  PHQ - 2 Score 0 3 0 0 2  Altered sleeping 0 2 0 0 2  Tired, decreased energy 0 3 0 0 3  Change in appetite 2 2 0 0 2  Feeling bad or failure about yourself  1 1 0 0 2  Trouble concentrating 0 1 0 0 0  Moving slowly or fidgety/restless 0 0 0 0 0  Suicidal thoughts 0 0 0 0 0  PHQ-9 Score 3 12 0 0 11  Difficult doing work/chores Somewhat difficult Very difficult - Not difficult at all Somewhat difficult  Some recent data might be hidden    phq 9 is positive   Fall Risk: Fall Risk  12/30/2019 10/14/2019 09/02/2019 07/31/2019 05/07/2019  Falls in the past year? 0 0 0 0 0  Comment - - - - -  Number falls in past yr: 0 0 0 0 0  Injury with Fall? 0 0 0 0 0  Risk Factor Category  - - - - -  Comment - - - - -  Risk for fall due to : - - - - -  Risk for fall due to: Comment - - - - -  Follow up - - - Falls prevention discussed -      Functional Status Survey: Is the patient deaf or have difficulty hearing?: No Does the patient have difficulty seeing, even when wearing glasses/contacts?: No Does the patient have difficulty concentrating, remembering, or making decisions?: No Does the patient have difficulty walking or climbing stairs?: Yes Does the patient have difficulty dressing or bathing?: No Does the patient have difficulty doing errands alone such as visiting a doctor's office or shopping?: No    Assessment & Plan  1. Obstructive sleep apnea  - modafinil (PROVIGIL) 100 MG tablet; Take 1 tablet (100 mg total) by mouth daily.  Dispense: 90 tablet; Refill: 0  2. MDD (major depressive disorder), recurrent episode, moderate ([name])  - modafinil (PROVIGIL) 100 MG tablet; Take 1 tablet (100 mg total) by mouth daily.  Dispense: 90 tablet; Refill: 0 - sertraline (ZOLOFT) 100 MG tablet; Take 1.5 tablets (150 mg total) by mouth daily.  Dispense: 135 tablet; Refill: 0  3. Adult hypothyroidism  - TSH  4. Spinal stenosis of lumbar region with neurogenic claudication  Keep follow up with psyatrist   5. Dyslipidemia associated with type 2 diabetes mellitus ([name])  - omega-3 acid ethyl esters (LOVAZA) 1 g capsule; Take 2 capsules (2 g total) by mouth 2 (two) times daily.  Dispense: 360 capsule; Refill: 0   6. Impetigo  - amoxicillin (AMOXIL) 500 MG tablet; Take 1 tablet (500 mg total) by mouth 2 (two) times daily.  Dispense: 14 tablet; Refill: 0

## 2019-12-31 LAB — TSH: TSH: 1.55 mIU/L (ref 0.40–4.50)

## 2020-01-03 ENCOUNTER — Other Ambulatory Visit: Payer: Self-pay | Admitting: Family Medicine

## 2020-01-05 ENCOUNTER — Telehealth: Payer: Self-pay | Admitting: Family Medicine

## 2020-01-05 NOTE — Chronic Care Management (AMB) (Signed)
  Care Management   Note  01/05/2020 Name: Summer [name] MRN: [id number] DOB: Sep 20, 1950  Summer [name] is a 69 y.o. year old female who is a primary care patient of Steele Sizer, MD and is actively engaged with the care management team. I reached out to Summer [name] by phone today to assist with scheduling an initial visit with the Pharmacist  Follow up plan: Patient declines further follow up and engagement by the care management team. Appropriate care team members and provider have been notified via electronic communication.   Noreene Larsson, [place], [place], [place] [address] Direct Dial: [phone] [email or web address] Website: [email or web address]

## 2020-01-06 DIAGNOSIS — M5416 Radiculopathy, lumbar region: Secondary | ICD-10-CM | POA: Diagnosis not present

## 2020-01-06 DIAGNOSIS — M48062 Spinal stenosis, lumbar region with neurogenic claudication: Secondary | ICD-10-CM | POA: Diagnosis not present

## 2020-01-06 DIAGNOSIS — M5136 Other intervertebral disc degeneration, lumbar region: Secondary | ICD-10-CM | POA: Diagnosis not present

## 2020-01-08 DIAGNOSIS — E785 Hyperlipidemia, unspecified: Secondary | ICD-10-CM | POA: Diagnosis not present

## 2020-01-08 DIAGNOSIS — E039 Hypothyroidism, unspecified: Secondary | ICD-10-CM | POA: Diagnosis not present

## 2020-01-08 DIAGNOSIS — E1142 Type 2 diabetes mellitus with diabetic polyneuropathy: Secondary | ICD-10-CM | POA: Diagnosis not present

## 2020-01-08 DIAGNOSIS — I10 Essential (primary) hypertension: Secondary | ICD-10-CM | POA: Diagnosis not present

## 2020-01-08 DIAGNOSIS — E1169 Type 2 diabetes mellitus with other specified complication: Secondary | ICD-10-CM | POA: Diagnosis not present

## 2020-01-08 DIAGNOSIS — E1159 Type 2 diabetes mellitus with other circulatory complications: Secondary | ICD-10-CM | POA: Diagnosis not present

## 2020-01-08 DIAGNOSIS — Z794 Long term (current) use of insulin: Secondary | ICD-10-CM | POA: Diagnosis not present

## 2020-01-09 DIAGNOSIS — I10 Essential (primary) hypertension: Secondary | ICD-10-CM | POA: Diagnosis not present

## 2020-01-09 DIAGNOSIS — R809 Proteinuria, unspecified: Secondary | ICD-10-CM | POA: Diagnosis not present

## 2020-01-09 DIAGNOSIS — N1832 Chronic kidney disease, stage 3b: Secondary | ICD-10-CM | POA: Diagnosis not present

## 2020-01-09 DIAGNOSIS — N1831 Chronic kidney disease, stage 3a: Secondary | ICD-10-CM | POA: Diagnosis not present

## 2020-01-09 DIAGNOSIS — I509 Heart failure, unspecified: Secondary | ICD-10-CM | POA: Diagnosis not present

## 2020-01-09 DIAGNOSIS — E1122 Type 2 diabetes mellitus with diabetic chronic kidney disease: Secondary | ICD-10-CM | POA: Diagnosis not present

## 2020-01-09 DIAGNOSIS — I110 Hypertensive heart disease with heart failure: Secondary | ICD-10-CM | POA: Diagnosis not present

## 2020-01-12 ENCOUNTER — Ambulatory Visit: Payer: Medicare HMO | Admitting: Psychology

## 2020-01-13 ENCOUNTER — Other Ambulatory Visit: Payer: Self-pay | Admitting: Family Medicine

## 2020-01-13 DIAGNOSIS — E1169 Type 2 diabetes mellitus with other specified complication: Secondary | ICD-10-CM

## 2020-01-13 MED ORDER — ICOSAPENT ETHYL 1 G PO CAPS: 2.0000 g | ORAL_CAPSULE | Freq: Two times a day (BID) | ORAL | 5 refills | Status: DC

## 2020-01-13 NOTE — Telephone Encounter (Unsigned)
Copied from [place] [id number]. Topic: Quick Communication - See Telephone Encounter >> Jan 13, 2020 10:12 AM Loma Boston wrote: .CRM for notification. See Telephone encounter for: 01/13/20.omega-3 acid ethyl esters (LOVAZA) 1 g capsule [facility] [place], [place] - [address] Additional Information Pt states that needs [place] to generate a PA for this and that she thinks they want only a 30 day supply at the time

## 2020-01-15 ENCOUNTER — Other Ambulatory Visit: Payer: Self-pay

## 2020-01-15 ENCOUNTER — Other Ambulatory Visit: Payer: Self-pay | Admitting: Podiatry

## 2020-01-15 ENCOUNTER — Ambulatory Visit (INDEPENDENT_AMBULATORY_CARE_PROVIDER_SITE_OTHER): Payer: Medicare HMO

## 2020-01-15 ENCOUNTER — Ambulatory Visit (INDEPENDENT_AMBULATORY_CARE_PROVIDER_SITE_OTHER): Payer: Medicare HMO | Admitting: Podiatry

## 2020-01-15 ENCOUNTER — Encounter: Payer: Self-pay | Admitting: Podiatry

## 2020-01-15 VITALS — Temp 98.1°F

## 2020-01-15 DIAGNOSIS — M722 Plantar fascial fibromatosis: Secondary | ICD-10-CM | POA: Diagnosis not present

## 2020-01-15 DIAGNOSIS — S93602A Unspecified sprain of left foot, initial encounter: Secondary | ICD-10-CM | POA: Diagnosis not present

## 2020-01-15 DIAGNOSIS — M7742 Metatarsalgia, left foot: Secondary | ICD-10-CM | POA: Insufficient documentation

## 2020-01-15 MED ORDER — CEPHALEXIN 500 MG PO CAPS: 500.0000 mg | ORAL_CAPSULE | Freq: Two times a day (BID) | ORAL | 0 refills | Status: DC

## 2020-01-15 NOTE — Progress Notes (Addendum)
T his patient presents to the office with chief complaint of a painful outside of her left foot.  She states the she is experiencing sharp radiating pain noted through her left foot.  She says that the pain has been present for approximately 2 weeks but she admits having increased her walking and standing at work.  This patient has a history of Charcot foot due to her diabetes.  She had hardware put into her right rear foot to control the Charcot foot development.  She points to the outside of her left foot on the underside of her fifth metatarsal as the site of the sharp radiating pain.  Patient says she has tried Tylenol but the pain persists.  This patient has a history of type 2 diabetes arthritis of the right foot following surgery and stage III chronic kidney disease.  She presents the office today for an evaluation of her sharp radiating pain left foot.  She also requests preventative foot care services for her long thick painful nails.  General Appearance  Alert, conversant and in no acute stress.  Vascular  Dorsalis pedis and posterior tibial  pulses are palpable  bilaterally.  Capillary return is within normal limits  bilaterally. Temperature is within normal limits  bilaterally.  Neurologic  Senn-Weinstein monofilament wire test absent   bilaterally. Muscle power within normal limits bilaterally.  Nails Thick disfigured discolored nails with subungual debris  from hallux to fifth toes bilaterally. No evidence of bacterial infection or drainage bilaterally.  Orthopedic  No limitations of motion  feet .  No crepitus or effusions noted.  No bony pathology or digital deformities noted. Charcot foot right.    Skin  normotropic skin with no porokeratosis noted bilaterally.  No signs of infections or ulcers noted.  Red swollen DIPJ second right secondary to self inflicted laceration.    Foot Sprain left foot   Inflamed second toe right foot.  Onychomycosis  B/L.   ROV.  Xrays reveal no bony  pathology metatarsals left foot.  Rearfoot surgery with screw fixation left rearfoot.  Discussed this condition with this patient.  Recommended she ambulate with a cam walker for the next 2 weeks.  Also debridement of the nails was performed using a nail nipper followed by dermal tool usage.  I also prescribed cephalexin No. 15 500 mg for patient to take 1 twice daily x7 days to see if we can resolve her red swollen second toe right foot.  RTC 2 weeks   Gardiner Barefoot DPM  Addendum  Patient qualifies for diabetic shoes due to DPN and arthritis due to Charcot foot and hallux malleus.  Patient to see pedorthist  Gardiner Barefoot DPM

## 2020-01-20 ENCOUNTER — Ambulatory Visit (INDEPENDENT_AMBULATORY_CARE_PROVIDER_SITE_OTHER): Payer: Medicare HMO | Admitting: Psychology

## 2020-01-20 DIAGNOSIS — F509 Eating disorder, unspecified: Secondary | ICD-10-CM | POA: Diagnosis not present

## 2020-01-23 ENCOUNTER — Other Ambulatory Visit: Payer: Self-pay | Admitting: Family Medicine

## 2020-01-23 DIAGNOSIS — G4733 Obstructive sleep apnea (adult) (pediatric): Secondary | ICD-10-CM

## 2020-01-23 DIAGNOSIS — F331 Major depressive disorder, recurrent, moderate: Secondary | ICD-10-CM

## 2020-01-27 ENCOUNTER — Ambulatory Visit: Payer: Medicare HMO | Admitting: Psychology

## 2020-01-28 ENCOUNTER — Ambulatory Visit: Payer: Medicare HMO | Admitting: Orthotics

## 2020-01-28 ENCOUNTER — Other Ambulatory Visit: Payer: Self-pay

## 2020-01-28 DIAGNOSIS — E1142 Type 2 diabetes mellitus with diabetic polyneuropathy: Secondary | ICD-10-CM

## 2020-01-28 DIAGNOSIS — Z872 Personal history of diseases of the skin and subcutaneous tissue: Secondary | ICD-10-CM

## 2020-01-28 DIAGNOSIS — M722 Plantar fascial fibromatosis: Secondary | ICD-10-CM

## 2020-01-28 DIAGNOSIS — M21961 Unspecified acquired deformity of right lower leg: Secondary | ICD-10-CM

## 2020-01-28 DIAGNOSIS — M7742 Metatarsalgia, left foot: Secondary | ICD-10-CM

## 2020-01-28 DIAGNOSIS — S91114A Laceration without foreign body of right lesser toe(s) without damage to nail, initial encounter: Secondary | ICD-10-CM

## 2020-01-28 NOTE — Progress Notes (Signed)

## 2020-01-29 ENCOUNTER — Ambulatory Visit: Payer: Medicare HMO | Admitting: Podiatry

## 2020-01-31 ENCOUNTER — Other Ambulatory Visit: Payer: Self-pay | Admitting: Family Medicine

## 2020-01-31 DIAGNOSIS — K219 Gastro-esophageal reflux disease without esophagitis: Secondary | ICD-10-CM

## 2020-02-03 ENCOUNTER — Ambulatory Visit (INDEPENDENT_AMBULATORY_CARE_PROVIDER_SITE_OTHER): Payer: Medicare HMO | Admitting: Psychology

## 2020-02-03 DIAGNOSIS — M48062 Spinal stenosis, lumbar region with neurogenic claudication: Secondary | ICD-10-CM | POA: Diagnosis not present

## 2020-02-03 DIAGNOSIS — M5416 Radiculopathy, lumbar region: Secondary | ICD-10-CM | POA: Diagnosis not present

## 2020-02-03 DIAGNOSIS — F411 Generalized anxiety disorder: Secondary | ICD-10-CM | POA: Diagnosis not present

## 2020-02-03 DIAGNOSIS — F331 Major depressive disorder, recurrent, moderate: Secondary | ICD-10-CM

## 2020-02-03 DIAGNOSIS — M5136 Other intervertebral disc degeneration, lumbar region: Secondary | ICD-10-CM | POA: Diagnosis not present

## 2020-02-05 ENCOUNTER — Ambulatory Visit: Payer: Medicare HMO | Admitting: Dermatology

## 2020-02-05 ENCOUNTER — Encounter: Payer: Self-pay | Admitting: Dermatology

## 2020-02-05 ENCOUNTER — Other Ambulatory Visit: Payer: Self-pay

## 2020-02-05 DIAGNOSIS — D489 Neoplasm of uncertain behavior, unspecified: Secondary | ICD-10-CM

## 2020-02-05 DIAGNOSIS — L820 Inflamed seborrheic keratosis: Secondary | ICD-10-CM | POA: Diagnosis not present

## 2020-02-05 DIAGNOSIS — L710 Perioral dermatitis: Secondary | ICD-10-CM

## 2020-02-05 DIAGNOSIS — D485 Neoplasm of uncertain behavior of skin: Secondary | ICD-10-CM

## 2020-02-05 DIAGNOSIS — B079 Viral wart, unspecified: Secondary | ICD-10-CM

## 2020-02-05 DIAGNOSIS — L821 Other seborrheic keratosis: Secondary | ICD-10-CM

## 2020-02-05 MED ORDER — METRONIDAZOLE 0.75 % EX CREA: TOPICAL_CREAM | Freq: Two times a day (BID) | CUTANEOUS | 0 refills | Status: DC

## 2020-02-05 NOTE — Progress Notes (Signed)
New Patient Visit  Subjective  Summer [name] is a 69 y.o. female who presents for the following: New Patient (Initial Visit) (Patient is here today for some red areas on face around mouth and nose for past months, and also some areas under both breast and on her Right neck that seem to be growing. have been there for some times).   The following portions of the chart were reviewed this encounter and updated as appropriate:  Tobacco  Allergies  Meds  Problems  Med Hx  Surg Hx  Fam Hx      Review of Systems:  No other skin or systemic complaints except as noted in HPI or Assessment and Plan.  Objective  Well appearing patient in no apparent distress; mood and affect are within normal limits.  A focused examination was performed including head, including the scalp, face, neck, nose, ears, eyelids, and lips and chest. Relevant physical exam findings are noted in the Assessment and Plan.  Objective  B/L inframammary, Left breast x 18 (18): Erythematous keratotic or waxy stuck-on papule or plaque.   Objective  Right lateral neck: 0.5 cm pink papule  Objective  nose, chin, lips: 3 papules in perioral and nasal area  Objective  Left Distal 3rd Finger: Verrucous papules -- Discussed viral etiology and contagion.    Assessment & Plan    Inflamed seborrheic keratosis (18) B/L inframammary, Left breast x 18  Cryotherapy today Prior to procedure, discussed risks of blister formation, small wound, skin dyspigmentation, or rare scar following cryotherapy.    Destruction of lesion - B/L inframammary, Left breast x 18 Complexity: simple   Destruction method: cryotherapy   Informed consent: discussed and consent obtained   Timeout:  patient name, date of birth, surgical site, and procedure verified Lesion destroyed using liquid nitrogen: Yes   Region frozen until ice ball extended beyond lesion: Yes   Outcome: patient tolerated procedure well with no complications     Post-procedure details: wound care instructions given    Neoplasm of uncertain behavior Right lateral neck  Epidermal / dermal shaving  Lesion length (cm):  0.5 Lesion width (cm):  0.5 Margin per side (cm):  0.2 Total excision diameter (cm):  0.9 Informed consent: discussed and consent obtained   Timeout: patient name, date of birth, surgical site, and procedure verified   Procedure prep:  Patient was prepped and draped in usual sterile fashion Prep type:  Isopropyl alcohol Anesthesia: the lesion was anesthetized in a standard fashion   Anesthetic:  1% lidocaine w/ epinephrine 1-100,000 buffered w/ 8.4% NaHCO3 Instrument used: flexible razor blade   Hemostasis achieved with: pressure, aluminum chloride and electrodesiccation   Outcome: patient tolerated procedure well   Post-procedure details: sterile dressing applied and wound care instructions given   Dressing type: bandage and petrolatum    Specimen 1 - Surgical pathology Differential Diagnosis: skin tag vs dypslasia Check Margins: No  Perioral dermatitis nose, chin, lips  Start metronidazole 0.75% cream to affected areas twice daily as needed  Patient will call in 1 month to let us know how she is doing, and if Metronidazole cream is not working will start a 50mg  Doxycycline with food otherwise patient will follow up in 3 months.  metroNIDAZOLE (METROCREAM) 0.75 % cream - nose, chin, lips  Viral warts, unspecified type Left Distal 3rd Finger  Discussed viral etiology and risk of spread.  Discussed multiple treatments may be required to clear warts.  Discussed possible post-treatment dyspigmentation and risk of  recurrence.  Cryotherapy today Prior to procedure, discussed risks of blister formation, small wound, skin dyspigmentation, or rare scar following cryotherapy.    Destruction of lesion - Left Distal 3rd Finger Complexity: simple   Destruction method: cryotherapy   Informed consent: discussed and consent  obtained   Timeout:  patient name, date of birth, surgical site, and procedure verified Lesion destroyed using liquid nitrogen: Yes   Region frozen until ice ball extended beyond lesion: Yes   Outcome: patient tolerated procedure well with no complications   Post-procedure details: wound care instructions given     Seborrheic Keratoses - Stuck-on, waxy, tan-brown papules and plaques  - Discussed benign etiology and prognosis. - Observe - Call for any changes  Return in about 3 months (around 05/07/2020) for  Perioral derm.  Marene Lenz, CMA, am acting as scribe for Sarina Ser, MD . Documentation: I have reviewed the above documentation for accuracy and completeness, and I agree with the above.  Sarina Ser, MD

## 2020-02-05 NOTE — Patient Instructions (Addendum)
Recommend daily broad spectrum sunscreen SPF 30+ to sun-exposed areas, reapply every 2 hours as needed. Call for new or changing lesions. Wound Care Instructions  1. Cleanse wound gently with soap and water once a day then pat dry with clean gauze. Apply a thing coat of Petrolatum (petroleum jelly, "Vaseline") over the wound (unless you have an allergy to this). We recommend that you use a new, sterile tube of Vaseline. Do not pick or remove scabs. Do not remove the yellow or white "healing tissue" from the base of the wound.  2. Cover the wound with fresh, clean, nonstick gauze and secure with paper tape. You may use Band-Aids in place of gauze and tape if the would is small enough, but would recommend trimming much of the tape off as there is often too much. Sometimes Band-Aids can irritate the skin.  3. You should call the office for your biopsy report after 1 week if you have not already been contacted.  4. If you experience any problems, such as abnormal amounts of bleeding, swelling, significant bruising, significant pain, or evidence of infection, please call the office immediately.  5. FOR ADULT SURGERY PATIENTS: If you need something for pain relief you may take 1 extra strength Tylenol (acetaminophen) AND 2 Ibuprofen (200mg  each) together every 4 hours as needed for pain. (do not take these if you are allergic to them or if you have a reason you should not take them.) Typically, you may only need pain medication for 1 to 3 days.    Prior to procedure, discussed risks of blister formation, small wound, skin dyspigmentation, or rare scar following cryotherapy.  Cryotherapy Aftercare  . Wash gently with soap and water everyday.   Marland Kitchen Apply Vaseline and Band-Aid daily until healed.  Discussed viral etiology and risk of spread.  Discussed multiple treatments may be required to clear warts.  Discussed possible post-treatment dyspigmentation and risk of recurrence.

## 2020-02-09 ENCOUNTER — Ambulatory Visit: Payer: Medicare HMO | Admitting: Psychology

## 2020-02-12 ENCOUNTER — Telehealth: Payer: Self-pay | Admitting: Dietician

## 2020-02-12 ENCOUNTER — Other Ambulatory Visit: Payer: Self-pay

## 2020-02-12 ENCOUNTER — Ambulatory Visit: Payer: Medicare HMO | Admitting: Internal Medicine

## 2020-02-12 ENCOUNTER — Encounter: Payer: Self-pay | Admitting: Internal Medicine

## 2020-02-12 ENCOUNTER — Telehealth: Payer: Self-pay

## 2020-02-12 VITALS — BP 120/78 | HR 63 | Ht 62.0 in | Wt 258.2 lb

## 2020-02-12 DIAGNOSIS — R0600 Dyspnea, unspecified: Secondary | ICD-10-CM | POA: Diagnosis not present

## 2020-02-12 DIAGNOSIS — I10 Essential (primary) hypertension: Secondary | ICD-10-CM | POA: Diagnosis not present

## 2020-02-12 DIAGNOSIS — R0609 Other forms of dyspnea: Secondary | ICD-10-CM

## 2020-02-12 DIAGNOSIS — E782 Mixed hyperlipidemia: Secondary | ICD-10-CM | POA: Diagnosis not present

## 2020-02-12 NOTE — Telephone Encounter (Signed)
Patient advised biopsy results benign, JS 

## 2020-02-12 NOTE — Progress Notes (Signed)
Follow-up Outpatient Visit Date: 02/12/2020  Primary Care Provider: Steele Sizer, [address] [place] [address]  Chief Complaint: Shortness of breeath  HPI:  Summer [name] is a 69 y.o. female with history of HFpEF, hyperlipidemia, type 2 diabetes mellitus, asthma, obstructive sleep apnea, and morbid obesity, who presents for follow-up of shortness of breath.  I met her in 07/2019 for preoperative evaluation in anticipation of bariatric surgery.  At that time, she reported longstanding exertional dyspnea with minimal activity.  Preceeding echo showed normal LVEF.  Given abnormal EDKG with septal Q-waves, I recommended pharmacologic myocardial PET/CT at [facility], which was normal.  Today, Summer [name] reports feeling about the same as at our last visit with chronic exertional dyspnea.  This is unchanged.  She denies chest pain, palpitations, and lightheadedness.  She has mild edema, particularly when sitting/standing for extended periods.  She is still waiting to undergo bariatric surgery, as she has yet to receive final clearance/insurance approval.  --------------------------------------------------------------------------------------------------  Cardiovascular History & Procedures: Cardiovascular Problems:  Shortness of breath  Risk Factors:  Hyperlipidemia, diabetes mellitus, morbid obesity, and age greater than 78  Cath/PCI:  None  CV Surgery:  None  EP Procedures and Devices:  None  Non-Invasive Evaluation(s):  Myocardial PET/CT (07/23/2019, [facility]): Normal study without ischemia or scar.  LVEF > 65%.  Mild coronary artery calcification.  Mitral annular calcification.  TTE (04/03/2019): Normal LV size with LVEF greater than 65%.  Mild LVH.  Normal diastolic function.  Normal RV size and function.  Mitral annular calcification and valve thickening with trivial regurgitation.  TTE (07/04/2017): Normal LV size with LVEF 70%.  Grade 1 diastolic dysfunction.   Mild right atrial enlargement.  Normal RV size and function.  No significant valvular abnormality.  Pharmacologic MPI (02/19/2012): Probably normal study without significant ischemia or scar.  Study limited by GI uptake.  Recent CV Pertinent Labs: Lab Results  Component Value Date   CHOL 152 04/02/2019   CHOL 254 (H) 01/11/2016   HDL 32 (L) 04/02/2019   HDL 35 (L) 01/11/2016   LDLCALC 80 04/02/2019   [place]  05/01/2018     Comment:     . LDL cholesterol not calculated. Triglyceride levels greater than 400 mg/dL invalidate calculated LDL results. . Reference range: <100 . Desirable range <100 mg/dL for primary prevention;   <70 mg/dL for patients with CHD or diabetic patients  with > or = 2 CHD risk factors. Marland Kitchen LDL-C is now calculated using the Martin-Hopkins  calculation, which is a validated novel method providing  better accuracy than the Friedewald equation in the  estimation of LDL-C.  Cresenciano Genre et al. Annamaria Helling. 1749;449(67): 2061-2068  (http://education.[employer].com/faq/FAQ164)    TRIG 201 (H) 04/02/2019   CHOLHDL 4.8 04/02/2019   INR 1.0 04/02/2019   BNP 20 10/02/2017   K 4.1 10/17/2019   BUN 59 (H) 10/17/2019   BUN 42 (H) 01/11/2016   CREATININE 1.29 (H) 10/17/2019   CREATININE 1.42 (H) 10/14/2019    Past medical and surgical history were reviewed and updated in EPIC.  Current Meds  Medication Sig  . ACCU-CHEK SOFTCLIX LANCETS lancets   . acetaminophen (TYLENOL 8 HOUR) 650 MG CR tablet Take 1 tablet (650 mg total) by mouth every 8 (eight) hours as needed for pain.  Marland Kitchen albuterol (PROVENTIL HFA) 108 (90 Base) MCG/ACT inhaler Inhale 2 puffs into the lungs 2 (two) times daily as needed for shortness of breath.   . Alcohol Swabs (B-D SINGLE USE  SWABS REGULAR) PADS   . amLODipine (NORVASC) 5 MG tablet Take 1 tablet (5 mg total) by mouth daily.  . ASPIRIN LOW DOSE 81 MG EC tablet TAKE 1 TABLET (81 MG TOTAL) BY MOUTH DAILY.  Marland Kitchen Blood Glucose Monitoring Suppl  (ACCU-CHEK AVIVA PLUS) w/Device KIT   . Cyanocobalamin (VITAMIN B12 PO) Take 1 tablet by mouth daily.   . furosemide (LASIX) 20 MG tablet Take 1 tablet by mouth daily.  Marland Kitchen icosapent Ethyl (VASCEPA) 1 g capsule Take 2 capsules (2 g total) by mouth 2 (two) times daily.  . insulin NPH-regular Human (70-30) 100 UNIT/ML injection Inject 100 Units into the skin 2 (two) times daily with a meal.  . irbesartan-hydrochlorothiazide (AVALIDE) 300-12.5 MG tablet Take 1 tablet by mouth daily.  Marland Kitchen levothyroxine (SYNTHROID) 25 MCG tablet Take 1 tablet by mouth daily except, take 2 tablets by mouth on Sunday.  . lidocaine (XYLOCAINE) 5 % ointment Apply 1 application topically 3 (three) times daily as needed. To area of skin pain  . metFORMIN (GLUCOPHAGE-XR) 500 MG 24 hr tablet Take 500 mg by mouth. 2 tablets daily (total of 1000 mg daily)  . metoprolol tartrate (LOPRESSOR) 25 MG tablet Take 1 tablet by mouth twice daily.  . metroNIDAZOLE (METROCREAM) 0.75 % cream Apply topically in the morning and at bedtime.  . Miconazole Nitrate 2 % POWD Apply to skin folds twice a day  . Misc Natural Products (OSTEO BI-FLEX ADV JOINT SHIELD PO) Take by mouth 2 (two) times daily.   . modafinil (PROVIGIL) 100 MG tablet Take 1 tablet (100 mg total) by mouth daily.  . montelukast (SINGULAIR) 10 MG tablet Take 1 tablet by mouth at bedtime.  Marland Kitchen omeprazole (PRILOSEC) 40 MG capsule Take 1 capsule by mouth daily.  . ondansetron (ZOFRAN ODT) 4 MG disintegrating tablet Take 1 tablet (4 mg total) by mouth every 8 (eight) hours as needed for nausea or vomiting.  . Potassium 99 MG TABS Take by mouth daily.   . pregabalin (LYRICA) 300 MG capsule Take 300 mg by mouth 2 (two) times a day.  . Probiotic Product (PROBIOTIC-10 PO) Take by mouth.  Daryll Brod INSULIN SYRINGE 1ML/31G 31G X 5/16" 1 ML MISC   . rosuvastatin (CRESTOR) 20 MG tablet Take 1 tablet by mouth daily.  . sertraline (ZOLOFT) 100 MG tablet Take 1.5 tablets (150 mg total) by mouth  daily.  Marland Kitchen tiZANidine (ZANAFLEX) 4 MG tablet Take 4-6 mg by mouth 2 (two) times daily as needed.  . traMADol (ULTRAM) 50 MG tablet Take 1 tablet by mouth twice daily as needed  . traZODone (DESYREL) 50 MG tablet Take 1 tablet (50 mg total) by mouth at bedtime as needed for sleep.  . vitamin C (ASCORBIC ACID) 500 MG tablet Take 500 mg by mouth 2 (two) times daily.   Marland Kitchen VITAMIN D, CHOLECALCIFEROL, PO Take 1,000 Units by mouth daily.   . [DISCONTINUED] omega-3 acid ethyl esters (LOVAZA) 1 g capsule Take 2 capsules (2 g total) by mouth 2 (two) times daily.  . [DISCONTINUED] Omega-3 Fatty Acids (FISH OIL) 1000 MG CAPS Take 2,000 mg by mouth daily.     Allergies: Codeine, Atorvastatin, Hydrocodone, Tramadol, Latex, and Zolpidem  Social History   Tobacco Use  . Smoking status: Never Smoker  . Smokeless tobacco: Never Used  Substance Use Topics  . Alcohol use: No    Alcohol/week: 0.0 standard drinks  . Drug use: No    Family History  Problem Relation Age of Onset  .  Aneurysm Mother   . Aortic aneurysm Mother   . Heart attack Maternal Grandfather   . Diabetes Maternal Grandfather     Review of Systems: A 12-system review of systems was performed and was negative except as noted in the HPI.  --------------------------------------------------------------------------------------------------  Physical Exam: BP 120/78 (BP Location: Right Arm, Patient Position: Sitting, Cuff Size: Large)   Pulse 63   Ht _0  (1.575 m)   Wt 258 lb 4 oz (117.1 kg)   SpO2 98%   BMI 47.23 kg/m   General:  NAD. Neck: No JVD or HJR, though body habitus limits evaluation. Lungs: Mildly diminished breath sounds throughout without wheezes or crackles. Heart: Distant heart sounds.  RRR w/o m/r/g. Abd: Obese but soft.  NT/ND. Ext: Trace ankle edema bilaterally.  EKG:  NSR with poor R wave progression.  Heart rate has decreased since 08/14/2019.  Otherwise, there has been no significant interval change.  Lab  Results  Component Value Date   WBC 8.6 10/14/2019   HGB 11.4 (L) 10/14/2019   HCT 33.7 (L) 10/14/2019   MCV 85.8 10/14/2019   PLT 238 10/14/2019    Lab Results  Component Value Date   NA 142 10/17/2019   K 4.1 10/17/2019   CL 102 10/17/2019   CO2 28 10/17/2019   BUN 59 (H) 10/17/2019   CREATININE 1.29 (H) 10/17/2019   GLUCOSE 72 10/17/2019   ALT 21 10/17/2019    Lab Results  Component Value Date   CHOL 152 04/02/2019   HDL 32 (L) 04/02/2019   LDLCALC 80 04/02/2019   TRIG 201 (H) 04/02/2019   CHOLHDL 4.8 04/02/2019    --------------------------------------------------------------------------------------------------  ASSESSMENT AND PLAN: Dyspnea on exertion: Chronic and mostly like due to morbid obesity.  Echo and PET/CT stress test have been reassuring.  No further cardiac testing or intervention is recommended.  Morbid obesity: I see no cardiac contraindication for proceeding with bariatric surgery.  Weight loss encouraged.  Hypertension: BP at goal.  Continue current medications.  Hyperlipidemia: Given morbid obesity and mild coronary artery calcification noted on PET/CT, I agree with continuation of rosuvastatin for goal LDL < 100.  Triglycerides still mildly elevated but improved with Vascepa.  Follow-up: Return to clinic in 1 year.  [name], MD 02/12/2020 10:06 PM

## 2020-02-12 NOTE — Patient Instructions (Signed)
Medication Instructions:  Your physician recommends that you continue on your current medications as directed. Please refer to the Current Medication list given to you today.  *If you need a refill on your cardiac medications before your next appointment, please call your pharmacy*  Follow-Up: At [facility], you and your health needs are our priority.  As part of our continuing mission to provide you with exceptional heart care, we have created designated Provider Care Teams.  These Care Teams include your primary Cardiologist (physician) and Advanced Practice Providers (APPs -  Physician Assistants and Nurse Practitioners) who all work together to provide you with the care you need, when you need it.  We recommend signing up for the patient portal called "MyChart".  Sign up information is provided on this After Visit Summary.  MyChart is used to connect with patients for Virtual Visits (Telemedicine).  Patients are able to view lab/test results, encounter notes, upcoming appointments, etc.  Non-urgent messages can be sent to your provider as well.   To learn more about what you can do with MyChart, go to [email or web address].    Your next appointment:   12 month(s)  The format for your next appointment:   In Person  Provider:    You may see Christopher End, MD or one of the following Advanced Practice Providers on your designated Care Team:    Christopher Berge, NP  Ryan Dunn, PA-C  Jacquelyn Visser, PA-C 

## 2020-02-12 NOTE — Telephone Encounter (Signed)
Returned message from patient to schedule follow-up MNT to update/ refresh nutrition instruction prior to bariatric surgery, per recommendation from behavior therapist. Scheduled appointment for 02/20/20.

## 2020-02-13 ENCOUNTER — Encounter: Payer: Self-pay | Admitting: Dermatology

## 2020-02-20 ENCOUNTER — Encounter: Payer: Self-pay | Admitting: Dietician

## 2020-02-20 ENCOUNTER — Other Ambulatory Visit: Payer: Self-pay

## 2020-02-20 ENCOUNTER — Ambulatory Visit (INDEPENDENT_AMBULATORY_CARE_PROVIDER_SITE_OTHER): Payer: Medicare HMO | Admitting: Psychology

## 2020-02-20 ENCOUNTER — Encounter: Payer: Medicare HMO | Attending: Surgery | Admitting: Dietician

## 2020-02-20 VITALS — Ht 62.0 in | Wt 255.1 lb

## 2020-02-20 DIAGNOSIS — Z6841 Body Mass Index (BMI) 40.0 and over, adult: Secondary | ICD-10-CM | POA: Insufficient documentation

## 2020-02-20 DIAGNOSIS — F411 Generalized anxiety disorder: Secondary | ICD-10-CM

## 2020-02-20 DIAGNOSIS — F331 Major depressive disorder, recurrent, moderate: Secondary | ICD-10-CM | POA: Diagnosis not present

## 2020-02-20 NOTE — Patient Instructions (Signed)
   Concentrate on eating lean protein foods and plenty of low-carb vegetables. Gradually decrease starchy foods.  Continue to eat a meal or snack every 3-5 hours during the day to keep blood sugar stabilized. If not hungry, use protein shake as a back-up.

## 2020-02-20 NOTE — Progress Notes (Signed)
Appt start time: 1430 end time:  1520.  Assessment:   Weight loss/ review Appointment.   Start Wt at [place]: 254.9lbs 09/18/18 Wt: 255.1lbs Ht: 5'2" BMI: 46.7   Learning Readiness:   Change in progress  MEDICATIONS: acetaminophen, amLODipine, aspirin, furosemide, insulin 70/30, irbesartan-HCTZ, levothyroxine, metFORMIN, methocarbamol, metoprolol, modafinil, montelukast, omeprazole, potassium, probiotic, rosuvastatin, sertraline, traZODone, vitamin C, vitamin D, multivitamin   Progress:  Patient has kept food diary for multiple months, intially tried to monitor calories and sugar and sodium.   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   She reports husband is supportive, has decreased the sweets and unhealthy foods in the home.   Patient has consulted with others who have had bariatric surgery before and during the past year. She is in the process of reviewing written materials provided from surgical practice and MNT, pre-op visits.    Dietary intake:  Breakfast: eggs; egg sandwich; cereal with milk, fruit  Snack: yogurt; cheese and crackers (small portion); protein shake  Lunch: sometimes not hungry when eating larger breakfast; trying to add a light meal such as tuna and fruit; vienna sausage; cheese; bologna and cheese Snack: same as am Dinner: BBQ, chicken/fish + potato/bread/ beans +/or veg ie carrots or salad Snack: usually none; sometimes cheese + fruit; rarely pie or ice cream Beverages: water 64+oz, some protein shakes   Usual physical activity: unable to exercise   Diet to Follow: Gradual reduction in carbohydrate foods, emphasis on lean protein foods and low-carb vegetables               Nutritional Diagnosis:  [place]-3.3 Overweight/obesity related to history of excess calories and physical inactivity as evidenced by patient current BMI of 46.7, working on dietary changes to promote weight loss prior to bariatric  surgery.              Intervention:   . Nutrition counseling for weight loss and review of pre- and post-op diets prior to upcoming bariatric surgery including goals for protein and fluid intake, options for meals and protein drinks, post-op vitamin supplementation, appropriate meal and snack schedule both before and after surgery. . Reviewed patient's progress since previous visit on 03/28/19.  Marland Kitchen Discussed pre-op goals. . Reviewed importance of support from others; patient will be meeting with therapist for ongoing visits throughout her weight loss journey, and will return for post-op MNT visits. . From a nutrition standpoint, this patient is ready to proceed with the bariatric surgery program.  Teaching Method Utilized:  Visual Auditory Hands on  Handouts given during visit include:  Goals and Instructions/ after-visit summary  Patient has all resources provided at previous visits.  Barriers to learning/adherence to lifestyle change: none  Demonstrated degree of understanding via:  Teach Back   Monitoring/Evaluation:  Dietary intake, exercise, and body weight    Follow-up: to be scheduled.

## 2020-03-01 ENCOUNTER — Telehealth: Payer: Self-pay | Admitting: Family Medicine

## 2020-03-01 NOTE — Telephone Encounter (Signed)
Copied from [place] [id number]. Topic: General - Inquiry >> Feb 25, 2020 11:25 AM Alease Frame wrote: Reason for CRM: Pt is wanting a referral from office for her knees . Please advise  Call back [phone] >> Feb 27, 2020  9:44 AM Percell Belt A wrote: Pt would like to be referred to: [facility] Phone -[phone] Reason for referral - pt would like to have her knees worked on for all the knee pain she has been having.  Pt has an appt next and she needs to have to referral in by then.

## 2020-03-01 NOTE — Telephone Encounter (Signed)
Patient is calling to requesting a referral to [facility] Phone [phone] Patient has been seen by Dr. Ancil Boozer previously for knee pain. Dr. Ancil Boozer has already referred patient  to a doctor for the knee Pain. PatiIent was not satisfied with MD. Patient has found [facility] on her own. And is needing a referral to [facility].  Patient has already been for consultation. And will start the first treatment on 03/11/20.  Please advise Cb- [phone]

## 2020-03-02 ENCOUNTER — Ambulatory Visit: Payer: Medicare HMO | Admitting: Psychology

## 2020-03-02 ENCOUNTER — Other Ambulatory Visit: Payer: Self-pay | Admitting: Family Medicine

## 2020-03-02 DIAGNOSIS — G8929 Other chronic pain: Secondary | ICD-10-CM

## 2020-03-02 NOTE — Telephone Encounter (Signed)
Yes, please place a new referral

## 2020-03-04 ENCOUNTER — Encounter: Payer: Self-pay | Admitting: Dietician

## 2020-03-04 NOTE — Progress Notes (Signed)
Spoke with Mr. Summer [name], [place], who is part of Ms. [facility] care team. Release of Information has been signed by [name]. Discussed concerns of behavioral care provider regarding Summer [name] diet habits and whether she is making positive diet changes to indicate ability to follow bariatric diet after surgery. Confirmed that Summer [name] is making positive changes, voices understanding of pre-op and post-op dietary goals and guidelines after recent review, and voices confidence that she will be able to follow pre- and post-op diets. Reported that Summer [name] is planning to attend post-op RD visits beginning at 2 weeks post-op. Copy of report from 02/20/20 MNT visit faxed to Mr. Summer [name].

## 2020-03-08 NOTE — Addendum Note (Signed)
Addended by: Steele Sizer F on: 03/08/2020 02:01 PM   Modules accepted: Orders

## 2020-03-08 NOTE — Addendum Note (Signed)
Addended by: Chilton Greathouse on: 03/08/2020 09:33 AM   Modules accepted: Orders

## 2020-03-08 NOTE — Telephone Encounter (Signed)
PT calling back to check status

## 2020-03-11 ENCOUNTER — Encounter: Payer: Self-pay | Admitting: Dietician

## 2020-03-11 DIAGNOSIS — M170 Bilateral primary osteoarthritis of knee: Secondary | ICD-10-CM | POA: Diagnosis not present

## 2020-03-11 NOTE — Progress Notes (Signed)
Sent letter to Mr. Mostafa, LCSW, per his request. Release of information has been signed by patient.

## 2020-03-12 ENCOUNTER — Encounter: Payer: Self-pay | Admitting: Family Medicine

## 2020-03-12 ENCOUNTER — Ambulatory Visit (INDEPENDENT_AMBULATORY_CARE_PROVIDER_SITE_OTHER): Payer: Medicare HMO | Admitting: Family Medicine

## 2020-03-12 ENCOUNTER — Other Ambulatory Visit: Payer: Self-pay

## 2020-03-12 VITALS — BP 136/88 | HR 70 | Temp 97.6°F | Resp 22 | Ht 62.0 in | Wt 254.0 lb

## 2020-03-12 DIAGNOSIS — Z6841 Body Mass Index (BMI) 40.0 and over, adult: Secondary | ICD-10-CM

## 2020-03-12 DIAGNOSIS — J4520 Mild intermittent asthma, uncomplicated: Secondary | ICD-10-CM | POA: Diagnosis not present

## 2020-03-12 DIAGNOSIS — E039 Hypothyroidism, unspecified: Secondary | ICD-10-CM

## 2020-03-12 DIAGNOSIS — F331 Major depressive disorder, recurrent, moderate: Secondary | ICD-10-CM

## 2020-03-12 DIAGNOSIS — M47816 Spondylosis without myelopathy or radiculopathy, lumbar region: Secondary | ICD-10-CM | POA: Diagnosis not present

## 2020-03-12 DIAGNOSIS — E1142 Type 2 diabetes mellitus with diabetic polyneuropathy: Secondary | ICD-10-CM

## 2020-03-12 DIAGNOSIS — G4733 Obstructive sleep apnea (adult) (pediatric): Secondary | ICD-10-CM | POA: Diagnosis not present

## 2020-03-12 DIAGNOSIS — E1169 Type 2 diabetes mellitus with other specified complication: Secondary | ICD-10-CM

## 2020-03-12 DIAGNOSIS — E785 Hyperlipidemia, unspecified: Secondary | ICD-10-CM | POA: Diagnosis not present

## 2020-03-12 DIAGNOSIS — I700 Atherosclerosis of aorta: Secondary | ICD-10-CM | POA: Diagnosis not present

## 2020-03-12 DIAGNOSIS — L089 Local infection of the skin and subcutaneous tissue, unspecified: Secondary | ICD-10-CM

## 2020-03-12 DIAGNOSIS — M48062 Spinal stenosis, lumbar region with neurogenic claudication: Secondary | ICD-10-CM

## 2020-03-12 DIAGNOSIS — M5416 Radiculopathy, lumbar region: Secondary | ICD-10-CM | POA: Diagnosis not present

## 2020-03-12 DIAGNOSIS — N1832 Chronic kidney disease, stage 3b: Secondary | ICD-10-CM

## 2020-03-12 DIAGNOSIS — I10 Essential (primary) hypertension: Secondary | ICD-10-CM | POA: Diagnosis not present

## 2020-03-12 DIAGNOSIS — S90425A Blister (nonthermal), left lesser toe(s), initial encounter: Secondary | ICD-10-CM

## 2020-03-12 DIAGNOSIS — M5136 Other intervertebral disc degeneration, lumbar region: Secondary | ICD-10-CM | POA: Diagnosis not present

## 2020-03-12 LAB — POCT GLYCOSYLATED HEMOGLOBIN (HGB A1C): Hemoglobin A1C: 6.7 % — AB (ref 4.0–5.6)

## 2020-03-12 MED ORDER — METOPROLOL TARTRATE 25 MG PO TABS: 25.0000 mg | ORAL_TABLET | Freq: Two times a day (BID) | ORAL | 1 refills | Status: DC

## 2020-03-12 MED ORDER — ICOSAPENT ETHYL 1 G PO CAPS: 2.0000 g | ORAL_CAPSULE | Freq: Two times a day (BID) | ORAL | 5 refills | Status: DC

## 2020-03-12 MED ORDER — AMLODIPINE BESYLATE 5 MG PO TABS: 5.0000 mg | ORAL_TABLET | Freq: Every day | ORAL | 1 refills | Status: DC

## 2020-03-12 MED ORDER — FUROSEMIDE 20 MG PO TABS: 20.0000 mg | ORAL_TABLET | Freq: Every day | ORAL | 1 refills | Status: DC

## 2020-03-12 MED ORDER — MUPIROCIN 2 % EX OINT: 1.0000 "application " | TOPICAL_OINTMENT | Freq: Two times a day (BID) | CUTANEOUS | 0 refills | Status: DC

## 2020-03-12 MED ORDER — LEVOTHYROXINE SODIUM 25 MCG PO TABS: 25.0000 ug | ORAL_TABLET | Freq: Every day | ORAL | 0 refills | Status: DC

## 2020-03-12 MED ORDER — MODAFINIL 100 MG PO TABS: 100.0000 mg | ORAL_TABLET | Freq: Every day | ORAL | 0 refills | Status: DC

## 2020-03-12 MED ORDER — MONTELUKAST SODIUM 10 MG PO TABS: 10.0000 mg | ORAL_TABLET | Freq: Every day | ORAL | 1 refills | Status: DC

## 2020-03-12 NOTE — Progress Notes (Signed)
Name: Summer [name]   MRN: [id number]    DOB: 1951-02-06   Date:03/12/2020       Progress Note  Subjective  Chief Complaint  Chief Complaint  Patient presents with   Follow-up   Diabetes   Shortness of Breath    HPI  Advanced diffuse lumbar disc and facet degeneration causing severe spinal stenosis from L1-2 to L4-5 : she saw  Dr. Lacinda Axon  - neurosurgeon / [facility], he advised conservative measures with Dr. Chasniss/psyatrist . She had injections on her spine,  was given tramadol , advised to continue Tylenol and Lyrica 300 mg BID . Today she states pain is on right lower back, she states numbness of  left lateral foot and right thigh has decreased .   Hypothyroidism: she states hair loss is stable, no dysphagia, no change in bowel movements. On levothyroxine 25 mg daily and one and half on Sundays   MDD: she is currently taking Zoloft 150 mg daily, off duloxetine also on modafinil and has noticed increase in energy . She still wants to have bariatric surgery, waiting for approval by therapist, she is concerned about her health and cannot wait to start feeling better.   Atherosclerosis of aorta: found on CT abdomen, discussed importance of continues statin therapy , she states not taking lovaza, she would like another rx for vascepa and see if it gets approved  Diabetes : she is under the care of Endocrinologist, Dr. Honor Junes but did not tell CMA not to check level today, A1C 6.7%. She has dyslipidemia, obesity, and HTN associated with DM. She has occasional hypoglycemia and has been skipping morning dose if glucose is low in the morning, she will discuss it with Dr. Honor Junes Sore on right toe, recurrent, seeing podiatrist, no pain but gets red and has blister. CKI GFR stable, on ARB  OSA: she continues to wake up feeling tired, we started Mondafinil this year and it has helped with energy , she is still not wearing CPAP machine, she cannot tolerate it   HTN: bp is at goal today,  bp at home has been around 130's/80's, no chest pain or palpitation, she has SOB with activity but stable. Seeing Dr. Saunders Revel for SOB, likely multifactorial   OA: she has long history of bilateral knee pain, she has difficulty walking, standing, using a roller to help with ambulation, no effusion but pops and hurts, she was seen at [facility] and is getting ready to start injections   Patient Active Problem List   Diagnosis Date Noted   Metatarsalgia of left foot 01/15/2020   Preop cardiovascular exam 07/18/2019   Major depressive disorder, recurrent episode, moderate ([place]) 04/04/2019   Left arm weakness 04/03/2019   (HFpEF) heart failure with preserved ejection fraction ([place]) 08/31/2017   Moderate persistent asthma 08/31/2017   Hyperlipidemia due to type 2 diabetes mellitus ([place]) 06/14/2017   Dyslipidemia associated with type 2 diabetes mellitus ([place]) 08/18/2016   Charcot foot due to diabetes mellitus ([place]) 02/22/2016   Acquired abduction deformity of foot 07/12/2015   Osteoarthritis of subtalar joint 07/12/2015   Poorly controlled type 2 diabetes mellitus with neuropathy ([place]) 07/12/2015   Arthritis of foot, degenerative 07/12/2015   Carpal tunnel syndrome 04/17/2015   Chronic constipation 04/17/2015   Insomnia, persistent 04/17/2015   Stage 3 chronic kidney disease 04/17/2015   Decreased exercise tolerance 04/17/2015   Diabetes mellitus with polyneuropathy ([place]) 04/17/2015   Gastro-esophageal reflux disease without esophagitis 04/17/2015   Bursitis, trochanteric 04/17/2015  Cephalalgia 04/17/2015   Essential hypertension 04/17/2015   Adult hypothyroidism 04/17/2015   Hearing loss 04/17/2015   Chronic recurrent major depressive disorder ([place]) 04/17/2015   Neurogenic claudication 04/17/2015   Morbid obesity ([place]) 04/17/2015   Hypo-ovarianism 04/17/2015   Perennial allergic rhinitis with seasonal variation 04/17/2015   Acne erythematosa 04/17/2015    Dyskinesia, tardive 04/17/2015   Memory loss 04/17/2015   Impingement syndrome of shoulder 04/17/2015   Dermatitis, stasis 04/17/2015   Obstructive sleep apnea 05/14/2014   Dyspnea on exertion 05/06/2014   Mixed hyperlipidemia 02/06/2012   LBP (low back pain) 09/16/2008    Past Surgical History:  Procedure Laterality Date   ANKLE SURGERY Left approx Jan 2018   CATARACT EXTRACTION  01/2011   right   COLONOSCOPY WITH PROPOFOL N/A 08/19/2019   Procedure: COLONOSCOPY WITH PROPOFOL;  Surgeon: Jonathon Bellows, MD;  Location: [facility]COPY;  Service: Gastroenterology;  Laterality: N/A;   eye lid surgery  2013   bilateral   FOOT SURGERY     NECK SURGERY     TUBAL LIGATION     VAGINAL HYSTERECTOMY  1989    Family History  Problem Relation Age of Onset   Aneurysm Mother    Aortic aneurysm Mother    Heart attack Maternal Grandfather    Diabetes Maternal Grandfather     Social History   Tobacco Use   Smoking status: Never Smoker   Smokeless tobacco: Never Used  Substance Use Topics   Alcohol use: No    Alcohol/week: 0.0 standard drinks     Current Outpatient Medications:    ACCU-CHEK SOFTCLIX LANCETS lancets, , Disp: , Rfl:    acetaminophen (TYLENOL 8 HOUR) 650 MG CR tablet, Take 1 tablet (650 mg total) by mouth every 8 (eight) hours as needed for pain., Disp: 90 tablet, Rfl: 2   albuterol (PROVENTIL HFA) 108 (90 Base) MCG/ACT inhaler, Inhale 2 puffs into the lungs 2 (two) times daily as needed for shortness of breath. , Disp: , Rfl:    Alcohol Swabs (B-D SINGLE USE SWABS REGULAR) PADS, , Disp: , Rfl:    amLODipine (NORVASC) 5 MG tablet, Take 1 tablet (5 mg total) by mouth daily., Disp: 90 tablet, Rfl: 1   ASPIRIN LOW DOSE 81 MG EC tablet, TAKE 1 TABLET (81 MG TOTAL) BY MOUTH DAILY., Disp: 30 tablet, Rfl: 0   Blood Glucose Monitoring Suppl (ACCU-CHEK AVIVA PLUS) w/Device KIT, , Disp: , Rfl:    Cyanocobalamin (VITAMIN B12 PO), Take 1 tablet by mouth  daily. , Disp: , Rfl:    furosemide (LASIX) 20 MG tablet, Take 1 tablet (20 mg total) by mouth daily., Disp: 90 tablet, Rfl: 1   icosapent Ethyl (VASCEPA) 1 g capsule, Take 2 capsules (2 g total) by mouth 2 (two) times daily., Disp: 120 capsule, Rfl: 5   insulin NPH-regular Human (70-30) 100 UNIT/ML injection, Inject 100 Units into the skin 2 (two) times daily with a meal., Disp: , Rfl:    irbesartan-hydrochlorothiazide (AVALIDE) 300-12.5 MG tablet, Take 1 tablet by mouth daily., Disp: 90 tablet, Rfl: 1   levothyroxine (SYNTHROID) 25 MCG tablet, Take 1 tablet (25 mcg total) by mouth daily before breakfast. Take 1.5 pills on Sunday, Disp: 100 tablet, Rfl: 0   lidocaine (XYLOCAINE) 5 % ointment, Apply 1 application topically 3 (three) times daily as needed. To area of skin pain, Disp: 30 g, Rfl: 0   metFORMIN (GLUCOPHAGE-XR) 500 MG 24 hr tablet, Take 500 mg by mouth. 2 tablets daily (total  of 1000 mg daily), Disp: , Rfl:    methocarbamol (ROBAXIN) 750 MG tablet, Take by mouth as needed. , Disp: , Rfl:    metoprolol tartrate (LOPRESSOR) 25 MG tablet, Take 1 tablet (25 mg total) by mouth 2 (two) times daily., Disp: 180 tablet, Rfl: 1   metroNIDAZOLE (METROCREAM) 0.75 % cream, Apply topically in the morning and at bedtime., Disp: 45 g, Rfl: 0   Miconazole Nitrate 2 % POWD, Apply to skin folds twice a day, Disp: 100 g, Rfl: 0   Misc Natural Products (OSTEO BI-FLEX ADV JOINT SHIELD PO), Take by mouth 2 (two) times daily. , Disp: , Rfl:    modafinil (PROVIGIL) 100 MG tablet, Take 1 tablet (100 mg total) by mouth daily., Disp: 90 tablet, Rfl: 0   montelukast (SINGULAIR) 10 MG tablet, Take 1 tablet (10 mg total) by mouth at bedtime., Disp: 90 tablet, Rfl: 1   omeprazole (PRILOSEC) 40 MG capsule, Take 1 capsule by mouth daily., Disp: 90 capsule, Rfl: 1   Potassium 99 MG TABS, Take by mouth daily. , Disp: , Rfl:    pregabalin (LYRICA) 300 MG capsule, Take 300 mg by mouth 2 (two) times a day.,  Disp: , Rfl:    Probiotic Product (PROBIOTIC-10 PO), Take by mouth., Disp: , Rfl:    RELION INSULIN SYRINGE 1ML/31G 31G X 5/16" 1 ML MISC, , Disp: , Rfl:    rosuvastatin (CRESTOR) 20 MG tablet, Take 1 tablet by mouth daily., Disp: 90 tablet, Rfl: 1   sertraline (ZOLOFT) 100 MG tablet, Take 1.5 tablets (150 mg total) by mouth daily., Disp: 135 tablet, Rfl: 0   traZODone (DESYREL) 50 MG tablet, Take 1 tablet (50 mg total) by mouth at bedtime as needed for sleep., Disp: 90 tablet, Rfl: 1   vitamin C (ASCORBIC ACID) 500 MG tablet, Take 500 mg by mouth 2 (two) times daily. , Disp: , Rfl:    VITAMIN D, CHOLECALCIFEROL, PO, Take 1,000 Units by mouth daily. , Disp: , Rfl:    mupirocin ointment (BACTROBAN) 2 %, Apply 1 application topically 2 (two) times daily., Disp: 22 g, Rfl: 0  Allergies  Allergen Reactions   Codeine Other (See Comments)    "TRIPPED OUT"  DIDN'T LIKE THE WAY IT FELT   Atorvastatin     muscle pain   Hydrocodone     itching   Tramadol    Latex Rash   Zolpidem Other (See Comments)    Sleep walk    I personally reviewed active problem list, medication list, allergies, family history, social history, health maintenance with the patient/caregiver today.   ROS  Constitutional: Negative for fever or weight change.  Respiratory: Negative for cough and shortness of breath.   Cardiovascular: Negative for chest pain or palpitations.  Gastrointestinal: Negative for abdominal pain, no bowel changes.  Musculoskeletal: Negative for gait problem or joint swelling.  Skin: Negative for rash.  Neurological: Negative for dizziness or headache.  No other specific complaints in a complete review of systems (except as listed in HPI above).  Objective  Vitals:   03/12/20 1131  BP: 136/88  Pulse: 70  Resp: (!) 22  Temp: 97.6 F (36.4 C)  TempSrc: Temporal  SpO2: 93%  Weight: 254 lb (115.2 kg)  Height: _0  (1.575 m)    Body mass index is 46.46 kg/m.  Physical  Exam  Constitutional: Patient appears well-developed and well-nourished. Obese  No distress.  HEENT: head atraumatic, normocephalic, pupils equal and reactive to light, e  neck supple Cardiovascular: Normal rate, regular rhythm and normal heart sounds.  No murmur heard. No BLE edema. Pulmonary/Chest: Effort normal and breath sounds normal. No respiratory distress. Abdominal: Soft.  There is no tenderness. Psychiatric: Patient has a normal mood and affect. behavior is normal. Judgment and thought content normal.  Recent Results (from the past 2160 hour(s))  TSH     Status: None   Collection Time: 12/30/19 12:12 PM  Result Value Ref Range   TSH 1.55 0.40 - 4.50 mIU/L  POCT HgB A1C     Status: Abnormal   Collection Time: 03/12/20 11:37 AM  Result Value Ref Range   Hemoglobin A1C 6.7 (A) 4.0 - 5.6 %   HbA1c POC (<> result, manual entry)     HbA1c, POC (prediabetic range)     HbA1c, POC (controlled diabetic range)      Diabetic Foot Exam: Diabetic Foot Exam - Simple   Simple Foot Form Diabetic Foot exam was performed with the following findings: Yes 03/12/2020 12:07 PM  Visual Inspection See comments: Yes Sensation Testing See comments: Yes Pulse Check Posterior Tibialis and Dorsalis pulse intact bilaterally: Yes Comments Erythematous, swollen with some blisters on second right toe , seeing podiatrist, we will add muporicin      PHQ2/9: Depression screen [facility] 2/9 03/12/2020 12/30/2019 11/03/2019 11/03/2019 10/14/2019  Decreased Interest 0 0 2 0 0  Down, Depressed, Hopeless 0 0 1 0 0  PHQ - 2 Score 0 0 3 0 0  Altered sleeping 1 0 2 0 0  Tired, decreased energy 0 0 3 0 0  Change in appetite 0 2 2 0 0  Feeling bad or failure about yourself  0 1 1 0 0  Trouble concentrating 0 0 1 0 0  Moving slowly or fidgety/restless 0 0 0 0 0  Suicidal thoughts 0 0 0 0 0  PHQ-9 Score _0 0 0  Difficult doing work/chores Not difficult at all Somewhat difficult Very difficult - Not difficult at all   Some recent data might be hidden    phq 9 is negative   Fall Risk: Fall Risk  03/12/2020 02/20/2020 12/30/2019 10/14/2019 09/02/2019  Falls in the past year? 0 0 0 0 0  Comment - - - - -  Number falls in past yr: 0 - 0 0 0  Injury with Fall? 0 - 0 0 0  Risk Factor Category  - - - - -  Comment - - - - -  Risk for fall due to : Impaired balance/gait;Impaired mobility Impaired mobility - - -  Risk for fall due to: Comment - uses walker - - -  Follow up - - - - -    Functional Status Survey: Is the patient deaf or have difficulty hearing?: No Does the patient have difficulty seeing, even when wearing glasses/contacts?: No Does the patient have difficulty concentrating, remembering, or making decisions?: No Does the patient have difficulty walking or climbing stairs?: Yes Does the patient have difficulty dressing or bathing?: No Does the patient have difficulty doing errands alone such as visiting a doctor's office or shopping?: No    Assessment & Plan  1. Dyslipidemia associated with type 2 diabetes mellitus ([name])  - POCT HgB A1C - icosapent Ethyl (VASCEPA) 1 g capsule; Take 2 capsules (2 g total) by mouth 2 (two) times daily.  Dispense: 120 capsule; Refill: 5  2. Obstructive sleep apnea  - modafinil (PROVIGIL) 100 MG tablet; Take 1 tablet (100 mg total)  by mouth daily.  Dispense: 90 tablet; Refill: 0  3. MDD (major depressive disorder), recurrent episode, moderate ([name])  - modafinil (PROVIGIL) 100 MG tablet; Take 1 tablet (100 mg total) by mouth daily.  Dispense: 90 tablet; Refill: 0  4. Mild intermittent asthma in adult without complication  - montelukast (SINGULAIR) 10 MG tablet; Take 1 tablet (10 mg total) by mouth at bedtime.  Dispense: 90 tablet; Refill: 1  5. Benign hypertension  - metoprolol tartrate (LOPRESSOR) 25 MG tablet; Take 1 tablet (25 mg total) by mouth 2 (two) times daily.  Dispense: 180 tablet; Refill: 1 - amLODipine (NORVASC) 5 MG tablet; Take 1 tablet (5  mg total) by mouth daily.  Dispense: 90 tablet; Refill: 1  6. Adult hypothyroidism  - levothyroxine (SYNTHROID) 25 MCG tablet; Take 1 tablet (25 mcg total) by mouth daily before breakfast. Take 1.5 pills on Sunday  Dispense: 100 tablet; Refill: 0  7. Spinal stenosis    8. Atherosclerosis of aorta ([name])  - icosapent Ethyl (VASCEPA) 1 g capsule; Take 2 capsules (2 g total) by mouth 2 (two) times daily.  Dispense: 120 capsule; Refill: 5  9. Morbid obesity with BMI of 40.0-44.9, adult [facility])  Discussed with the patient the risk posed by an increased BMI. Discussed importance of portion control, calorie counting and at least 150 minutes of physical activity weekly. Avoid sweet beverages and drink more water. Eat at least 6 servings of fruit and vegetables daily   10. Essential hypertension  - amLODipine (NORVASC) 5 MG tablet; Take 1 tablet (5 mg total) by mouth daily.  Dispense: 90 tablet; Refill: 1  11. Diabetic polyneuropathy associated with type 2 diabetes mellitus ([name])  stable  12. Stage 3b chronic kidney disease  - furosemide (LASIX) 20 MG tablet; Take 1 tablet (20 mg total) by mouth daily.  Dispense: 90 tablet; Refill: 1  13. Blister of toe, infected, left, initial encounter  - mupirocin ointment (BACTROBAN) 2 %; Apply 1 application topically 2 (two) times daily.  Dispense: 22 g; Refill: 0

## 2020-03-16 ENCOUNTER — Ambulatory Visit (INDEPENDENT_AMBULATORY_CARE_PROVIDER_SITE_OTHER): Payer: Medicare HMO | Admitting: Psychology

## 2020-03-16 DIAGNOSIS — F331 Major depressive disorder, recurrent, moderate: Secondary | ICD-10-CM

## 2020-03-16 DIAGNOSIS — F411 Generalized anxiety disorder: Secondary | ICD-10-CM

## 2020-03-29 ENCOUNTER — Telehealth: Payer: Self-pay | Admitting: Podiatry

## 2020-03-29 NOTE — Telephone Encounter (Signed)
Pt left message @ 940am checking on status of diabetic shoes that were ordered 5.19.2021...  I returned call and left message that we have not received the medicare required diabetic shoe paperwork from Dr Ladell Pier yet. I refaxed it today.

## 2020-03-30 ENCOUNTER — Ambulatory Visit: Payer: Medicare HMO | Admitting: Psychology

## 2020-03-31 ENCOUNTER — Other Ambulatory Visit: Payer: Self-pay | Admitting: Family Medicine

## 2020-03-31 DIAGNOSIS — F331 Major depressive disorder, recurrent, moderate: Secondary | ICD-10-CM

## 2020-04-01 DIAGNOSIS — Z794 Long term (current) use of insulin: Secondary | ICD-10-CM | POA: Diagnosis not present

## 2020-04-01 DIAGNOSIS — E1142 Type 2 diabetes mellitus with diabetic polyneuropathy: Secondary | ICD-10-CM | POA: Diagnosis not present

## 2020-04-06 DIAGNOSIS — M1711 Unilateral primary osteoarthritis, right knee: Secondary | ICD-10-CM | POA: Diagnosis not present

## 2020-04-06 DIAGNOSIS — M2351 Chronic instability of knee, right knee: Secondary | ICD-10-CM | POA: Diagnosis not present

## 2020-04-08 DIAGNOSIS — M47816 Spondylosis without myelopathy or radiculopathy, lumbar region: Secondary | ICD-10-CM | POA: Diagnosis not present

## 2020-04-12 ENCOUNTER — Ambulatory Visit (INDEPENDENT_AMBULATORY_CARE_PROVIDER_SITE_OTHER): Payer: Medicare HMO | Admitting: Psychology

## 2020-04-12 DIAGNOSIS — F509 Eating disorder, unspecified: Secondary | ICD-10-CM

## 2020-04-13 ENCOUNTER — Ambulatory Visit (INDEPENDENT_AMBULATORY_CARE_PROVIDER_SITE_OTHER): Payer: Medicare HMO | Admitting: Psychology

## 2020-04-13 DIAGNOSIS — F411 Generalized anxiety disorder: Secondary | ICD-10-CM | POA: Diagnosis not present

## 2020-04-13 DIAGNOSIS — F331 Major depressive disorder, recurrent, moderate: Secondary | ICD-10-CM | POA: Diagnosis not present

## 2020-04-15 DIAGNOSIS — M1712 Unilateral primary osteoarthritis, left knee: Secondary | ICD-10-CM | POA: Diagnosis not present

## 2020-04-15 DIAGNOSIS — M2352 Chronic instability of knee, left knee: Secondary | ICD-10-CM | POA: Diagnosis not present

## 2020-04-20 DIAGNOSIS — M47816 Spondylosis without myelopathy or radiculopathy, lumbar region: Secondary | ICD-10-CM | POA: Diagnosis not present

## 2020-04-20 DIAGNOSIS — M170 Bilateral primary osteoarthritis of knee: Secondary | ICD-10-CM | POA: Diagnosis not present

## 2020-04-21 ENCOUNTER — Telehealth: Payer: Self-pay | Admitting: Podiatry

## 2020-04-21 NOTE — Telephone Encounter (Signed)
Pt left message checking status of diabetic shoes.  I returned call left message looks like they should be shipping anytime and I would call when they come in to schedule her to pick them up.Marland Kitchen

## 2020-04-27 ENCOUNTER — Ambulatory Visit (INDEPENDENT_AMBULATORY_CARE_PROVIDER_SITE_OTHER): Payer: Medicare HMO | Admitting: Psychology

## 2020-04-27 DIAGNOSIS — F411 Generalized anxiety disorder: Secondary | ICD-10-CM | POA: Diagnosis not present

## 2020-04-27 DIAGNOSIS — M170 Bilateral primary osteoarthritis of knee: Secondary | ICD-10-CM | POA: Diagnosis not present

## 2020-04-27 DIAGNOSIS — F331 Major depressive disorder, recurrent, moderate: Secondary | ICD-10-CM

## 2020-04-29 DIAGNOSIS — M170 Bilateral primary osteoarthritis of knee: Secondary | ICD-10-CM | POA: Diagnosis not present

## 2020-05-04 DIAGNOSIS — M170 Bilateral primary osteoarthritis of knee: Secondary | ICD-10-CM | POA: Diagnosis not present

## 2020-05-04 DIAGNOSIS — M48062 Spinal stenosis, lumbar region with neurogenic claudication: Secondary | ICD-10-CM | POA: Diagnosis not present

## 2020-05-04 DIAGNOSIS — M5136 Other intervertebral disc degeneration, lumbar region: Secondary | ICD-10-CM | POA: Diagnosis not present

## 2020-05-04 DIAGNOSIS — M47816 Spondylosis without myelopathy or radiculopathy, lumbar region: Secondary | ICD-10-CM | POA: Diagnosis not present

## 2020-05-04 DIAGNOSIS — M5416 Radiculopathy, lumbar region: Secondary | ICD-10-CM | POA: Diagnosis not present

## 2020-05-06 ENCOUNTER — Ambulatory Visit: Payer: Medicare HMO | Admitting: Dermatology

## 2020-05-06 ENCOUNTER — Other Ambulatory Visit: Payer: Self-pay

## 2020-05-06 DIAGNOSIS — L578 Other skin changes due to chronic exposure to nonionizing radiation: Secondary | ICD-10-CM

## 2020-05-06 DIAGNOSIS — L719 Rosacea, unspecified: Secondary | ICD-10-CM | POA: Diagnosis not present

## 2020-05-06 DIAGNOSIS — L820 Inflamed seborrheic keratosis: Secondary | ICD-10-CM | POA: Diagnosis not present

## 2020-05-06 DIAGNOSIS — L814 Other melanin hyperpigmentation: Secondary | ICD-10-CM

## 2020-05-06 DIAGNOSIS — L710 Perioral dermatitis: Secondary | ICD-10-CM | POA: Diagnosis not present

## 2020-05-06 DIAGNOSIS — D229 Melanocytic nevi, unspecified: Secondary | ICD-10-CM

## 2020-05-06 DIAGNOSIS — D1800 Hemangioma unspecified site: Secondary | ICD-10-CM

## 2020-05-06 DIAGNOSIS — L821 Other seborrheic keratosis: Secondary | ICD-10-CM

## 2020-05-06 NOTE — Progress Notes (Signed)
   Follow-Up Visit   Subjective  Summer [name] is a 69 y.o. female who presents for the following: irritated skin lesions (of the inframammary area, L breast, and R shoulder - patient would like areas treated today) and perioral dermatitis (Metronidazole was $50 for a small tube so patient didn't buy it. She has been using Vaseline instead).  The following portions of the chart were reviewed this encounter and updated as appropriate:  Tobacco  Allergies  Meds  Problems  Med Hx  Surg Hx  Fam Hx     Review of Systems:  No other skin or systemic complaints except as noted in HPI or Assessment and Plan.  Objective  Well appearing patient in no apparent distress; mood and affect are within normal limits.  A focused examination was performed including the trunk and extremities. Relevant physical exam findings are noted in the Assessment and Plan.  Objective  Face: Crusting on the nose and chin  Objective  L breast, R shoulder, R inframammary x 28 (30): Erythematous keratotic or waxy stuck-on papule or plaque.   Assessment & Plan  Perioral dermatitis and Rosacea Face Start Skin Medicinals mix BID  (Azelaic Acid 15%, Ivermectin 1%, and Metronidazole 1% - Vehicle Cream Size 30g.  Other Related Medications metroNIDAZOLE (METROCREAM) 0.75 % cream  Inflamed seborrheic keratosis (30) L breast, R shoulder, R inframammary x 28  Destruction of lesion - L breast, R shoulder, R inframammary x 28 Complexity: simple   Destruction method: cryotherapy   Informed consent: discussed and consent obtained   Timeout:  patient name, date of birth, surgical site, and procedure verified Lesion destroyed using liquid nitrogen: Yes   Region frozen until ice ball extended beyond lesion: Yes   Outcome: patient tolerated procedure well with no complications   Post-procedure details: wound care instructions given    Actinic Damage - diffuse scaly erythematous macules with underlying  dyspigmentation - Recommend daily broad spectrum sunscreen SPF 30+ to sun-exposed areas, reapply every 2 hours as needed.  - Call for new or changing lesions.  Hemangiomas - Red papules - Discussed benign nature - Observe - Call for any changes  Lentigines - Scattered tan macules - Discussed due to sun exposure - Benign, observe - Call for any changes  Melanocytic Nevi - Tan-brown and/or pink-flesh-colored symmetric macules and papules - Benign appearing on exam today - Observation - Call clinic for new or changing moles - Recommend daily use of broad spectrum spf 30+ sunscreen to sun-exposed areas.   Seborrheic Keratoses - Stuck-on, waxy, tan-brown papules and plaques  - Discussed benign etiology and prognosis. - Observe - Call for any changes  Return in about 2 months (around 07/06/2020).  Luther Redo, CMA, am acting as scribe for Sarina Ser, MD .  Documentation: I have reviewed the above documentation for accuracy and completeness, and I agree with the above.  Sarina Ser, MD

## 2020-05-11 ENCOUNTER — Ambulatory Visit (INDEPENDENT_AMBULATORY_CARE_PROVIDER_SITE_OTHER): Payer: Medicare HMO | Admitting: Psychology

## 2020-05-11 DIAGNOSIS — F411 Generalized anxiety disorder: Secondary | ICD-10-CM | POA: Diagnosis not present

## 2020-05-11 DIAGNOSIS — F331 Major depressive disorder, recurrent, moderate: Secondary | ICD-10-CM | POA: Diagnosis not present

## 2020-05-11 DIAGNOSIS — M170 Bilateral primary osteoarthritis of knee: Secondary | ICD-10-CM | POA: Diagnosis not present

## 2020-05-13 ENCOUNTER — Encounter: Payer: Self-pay | Admitting: Dermatology

## 2020-05-13 ENCOUNTER — Other Ambulatory Visit: Payer: Self-pay

## 2020-05-13 ENCOUNTER — Encounter (INDEPENDENT_AMBULATORY_CARE_PROVIDER_SITE_OTHER): Payer: Self-pay | Admitting: Family Medicine

## 2020-05-13 ENCOUNTER — Ambulatory Visit (INDEPENDENT_AMBULATORY_CARE_PROVIDER_SITE_OTHER): Payer: Medicare HMO | Admitting: Family Medicine

## 2020-05-13 VITALS — BP 131/65 | HR 70 | Temp 98.0°F | Ht 61.0 in | Wt 251.0 lb

## 2020-05-13 DIAGNOSIS — E538 Deficiency of other specified B group vitamins: Secondary | ICD-10-CM

## 2020-05-13 DIAGNOSIS — Z1331 Encounter for screening for depression: Secondary | ICD-10-CM

## 2020-05-13 DIAGNOSIS — E1169 Type 2 diabetes mellitus with other specified complication: Secondary | ICD-10-CM | POA: Diagnosis not present

## 2020-05-13 DIAGNOSIS — Z0289 Encounter for other administrative examinations: Secondary | ICD-10-CM

## 2020-05-13 DIAGNOSIS — I10 Essential (primary) hypertension: Secondary | ICD-10-CM | POA: Diagnosis not present

## 2020-05-13 DIAGNOSIS — E785 Hyperlipidemia, unspecified: Secondary | ICD-10-CM | POA: Diagnosis not present

## 2020-05-13 DIAGNOSIS — E1159 Type 2 diabetes mellitus with other circulatory complications: Secondary | ICD-10-CM

## 2020-05-13 DIAGNOSIS — Z6841 Body Mass Index (BMI) 40.0 and over, adult: Secondary | ICD-10-CM | POA: Diagnosis not present

## 2020-05-13 DIAGNOSIS — R5383 Other fatigue: Secondary | ICD-10-CM | POA: Diagnosis not present

## 2020-05-13 DIAGNOSIS — E559 Vitamin D deficiency, unspecified: Secondary | ICD-10-CM

## 2020-05-13 DIAGNOSIS — Z794 Long term (current) use of insulin: Secondary | ICD-10-CM

## 2020-05-13 DIAGNOSIS — R0602 Shortness of breath: Secondary | ICD-10-CM | POA: Diagnosis not present

## 2020-05-13 DIAGNOSIS — M170 Bilateral primary osteoarthritis of knee: Secondary | ICD-10-CM | POA: Diagnosis not present

## 2020-05-14 LAB — COMPREHENSIVE METABOLIC PANEL
ALT: 14 IU/L (ref 0–32)
AST: 14 IU/L (ref 0–40)
Albumin/Globulin Ratio: 2.4 — ABNORMAL HIGH (ref 1.2–2.2)
Albumin: 4.5 g/dL (ref 3.7–4.7)
Alkaline Phosphatase: 85 IU/L (ref 48–121)
BUN/Creatinine Ratio: 38 — ABNORMAL HIGH (ref 12–28)
BUN: 49 mg/dL — ABNORMAL HIGH (ref 8–27)
Bilirubin Total: 0.3 mg/dL (ref 0.0–1.2)
CO2: 25 mmol/L (ref 20–29)
Calcium: 9.9 mg/dL (ref 8.7–10.3)
Chloride: 104 mmol/L (ref 96–106)
Creatinine, Ser: 1.3 mg/dL — ABNORMAL HIGH (ref 0.57–1.00)
GFR calc Af Amer: 48 mL/min/{1.73_m2} — ABNORMAL LOW (ref >59)
GFR calc non Af Amer: 41 mL/min/{1.73_m2} — ABNORMAL LOW (ref >59)
Globulin, Total: 1.9 g/dL (ref 1.5–4.5)
Glucose: 85 mg/dL (ref 65–99)
Potassium: 5 mmol/L (ref 3.5–5.2)
Sodium: 142 mmol/L (ref 134–144)
Total Protein: 6.4 g/dL (ref 6.0–8.5)

## 2020-05-14 LAB — CBC WITH DIFFERENTIAL/PLATELET
Basophils Absolute: 0.1 10*3/uL (ref 0.0–0.2)
Basos: 1 %
EOS (ABSOLUTE): 0.4 10*3/uL (ref 0.0–0.4)
Eos: 5 %
Hematocrit: 33.7 % — ABNORMAL LOW (ref 34.0–46.6)
Hemoglobin: 11.1 g/dL (ref 11.1–15.9)
Immature Grans (Abs): 0.1 10*3/uL (ref 0.0–0.1)
Immature Granulocytes: 1 %
Lymphocytes Absolute: 1.3 10*3/uL (ref 0.7–3.1)
Lymphs: 17 %
MCH: 28.8 pg (ref 26.6–33.0)
MCHC: 32.9 g/dL (ref 31.5–35.7)
MCV: 87 fL (ref 79–97)
Monocytes Absolute: 0.8 10*3/uL (ref 0.1–0.9)
Monocytes: 10 %
Neutrophils Absolute: 5.3 10*3/uL (ref 1.4–7.0)
Neutrophils: 66 %
Platelets: 153 10*3/uL (ref 150–450)
RBC: 3.86 x10E6/uL (ref 3.77–5.28)
RDW: 15 % (ref 11.7–15.4)
WBC: 8 10*3/uL (ref 3.4–10.8)

## 2020-05-14 LAB — LIPID PANEL
Chol/HDL Ratio: 5.5 ratio — ABNORMAL HIGH (ref 0.0–4.4)
Cholesterol, Total: 209 mg/dL — ABNORMAL HIGH (ref 100–199)
HDL: 38 mg/dL — ABNORMAL LOW (ref >39)
LDL Chol Calc (NIH): 117 mg/dL — ABNORMAL HIGH (ref 0–99)
Triglycerides: 309 mg/dL — ABNORMAL HIGH (ref 0–149)
VLDL Cholesterol Cal: 54 mg/dL — ABNORMAL HIGH (ref 5–40)

## 2020-05-14 LAB — VITAMIN B12: Vitamin B-12: >2000 pg/mL — ABNORMAL HIGH (ref 232–1245)

## 2020-05-14 LAB — INSULIN, RANDOM: INSULIN: 78.8 u[IU]/mL — ABNORMAL HIGH (ref 2.6–24.9)

## 2020-05-14 LAB — VITAMIN D 25 HYDROXY (VIT D DEFICIENCY, FRACTURES): Vit D, 25-Hydroxy: 41.1 ng/mL (ref 30.0–100.0)

## 2020-05-14 LAB — TSH: TSH: 4.63 u[IU]/mL — ABNORMAL HIGH (ref 0.450–4.500)

## 2020-05-14 LAB — FOLATE: Folate: 8.8 ng/mL (ref >3.0)

## 2020-05-14 LAB — T4: T4, Total: 5.1 ug/dL (ref 4.5–12.0)

## 2020-05-14 LAB — HEMOGLOBIN A1C
Est. average glucose Bld gHb Est-mCnc: 146 mg/dL
Hgb A1c MFr Bld: 6.7 % — ABNORMAL HIGH (ref 4.8–5.6)

## 2020-05-14 LAB — T3: T3, Total: 72 ng/dL (ref 71–180)

## 2020-05-20 ENCOUNTER — Telehealth: Payer: Self-pay | Admitting: Podiatry

## 2020-05-20 NOTE — Progress Notes (Signed)
Chief Complaint:   OBESITY Summer [name] (MR# [id number]) is a 69 y.o. female who presents for evaluation and treatment of obesity and related comorbidities. Current BMI is Body mass index is 47.43 kg/m. Summer [name] has been struggling with her weight for many years and has been unsuccessful in either losing weight, maintaining weight loss, or reaching her healthy weight goal.  Summer [name] is seeing Dr. Hassell Done to have weight loss surgery, but she wants to try to get healthier and lose some weight before having surgery.  Summer [name] is currently in the action stage of change and ready to dedicate time achieving and maintaining a healthier weight. Summer [name] is interested in becoming our patient and working on intensive lifestyle modifications including (but not limited to) diet and exercise for weight loss.  Summer [name]'s habits were reviewed today and are as follows: Her family eats meals together, she thinks her family will eat healthier with her, her desired weight loss is 106 lbs, she started gaining weight 6 years ago, her heaviest weight ever was 252 pounds, she has significant food cravings issues, she snacks frequently in the evenings, she wakes up frequently in the middle of the night to eat, she skips meals frequently, she frequently makes poor food choices, she has problems with excessive hunger, she frequently eats larger portions than normal and she struggles with emotional eating.  Depression Screen Summer [name]'s Food and Mood (modified PHQ-9) score was 14.  Depression screen PHQ 2/9 05/13/2020  Decreased Interest 3  Down, Depressed, Hopeless 3  PHQ - 2 Score 6  Altered sleeping 1  Tired, decreased energy 3  Change in appetite 2  Feeling bad or failure about yourself  1  Trouble concentrating 1  Moving slowly or fidgety/restless 0  Suicidal thoughts 0  PHQ-9 Score 14  Difficult doing work/chores Somewhat difficult  Some recent data might be hidden   Subjective:   1. Other fatigue  Summer [name] admits to  daytime somnolence and admits to waking up still tired. Patent has a history of symptoms of daytime fatigue. Summer [name] generally gets 6 hours of sleep per night, and states that she has nightime awakenings. Snoring is present. Apneic episodes are present. Epworth Sleepiness Score is 11.  2. Shortness of breath on exertion Summer [name] notes increasing shortness of breath with exercising and seems to be worsening over time with weight gain. She notes getting out of breath sooner with activity than she used to. This has not gotten worse recently. Summer [name] denies shortness of breath at rest or orthopnea.  3. Type 2 diabetes mellitus with other specified complication, with long-term current use of insulin ([name]) Summer [name] is on multiple medications including 70/30 for diabetes mellitus due to budget. She did not bring in her BGs log today. She wants to work o weight loss to help improve her BGs.  4. Hypertension associated with diabetes ([place]) Summer [name] wants to work on diet and weight loss, and her blood pressure is well controlled today.  5. Hyperlipidemia associated with type 2 diabetes mellitus ([place]) Summer [name] is stable on Crestor and Vascepa, and she denies chest pain.  6. B12 deficiency Summer [name] has a history of B12 deficiency, and she notes fatigue.  7. Vitamin D deficiency Summer [name] has a history of Vit D deficiency, and she notes fatigue.  Assessment/Plan:   1. Other fatigue Summer [name] does feel that her weight is causing her energy to be lower than it should be. Fatigue may be related to obesity, depression or many other causes. Labs will  be ordered, and in the meanwhile, [name] will focus on self care including making healthy food choices, increasing physical activity and focusing on stress reduction.  - EKG 12-Lead - Vitamin B12 - CBC with Differential/Platelet - Folate - Hemoglobin A1c - Insulin, random - T3 - T4 - TSH - VITAMIN D 25 Hydroxy (Vit-D Deficiency, Fractures)  2. Shortness of breath on  exertion Summer [name] does feel that she gets out of breath more easily that she used to when she exercises. Sausha's shortness of breath appears to be obesity related and exercise induced. She has agreed to work on weight loss and gradually increase exercise to treat her exercise induced shortness of breath. Will continue to monitor closely.  - CBC with Differential/Platelet  3. Type 2 diabetes mellitus with other specified complication, with long-term current use of insulin ([name]) Good blood sugar control is important to decrease the likelihood of diabetic complications such as nephropathy, neuropathy, limb loss, blindness, coronary artery disease, and death. Intensive lifestyle modification including diet, exercise and weight loss are the first line of treatment for diabetes. We check labs today. Summer [name] will start her Category 2 plan, and hypoglycemia handout and blood sugar log was given. We will follow closely.  - Insulin, random  4. Hypertension associated with diabetes ([place]) Summer [name] will start her Category 2 plan, and will working on healthy weight loss and exercise to improve blood pressure control. We will watch for signs of hypotension as she continues her lifestyle modifications. We will check labs today.  - Comprehensive metabolic panel  5. Hyperlipidemia associated with type 2 diabetes mellitus ([place]) Cardiovascular risk and specific lipid/LDL goals reviewed. We discussed several lifestyle modifications today. Summer [name] will start her Category 2 plan, and will continue to work on exercise and weight loss efforts. We will check labs today. Orders and follow up as documented in patient record.   - Comprehensive metabolic panel - Lipid panel  6. B12 deficiency The diagnosis was reviewed with the patient. We will check labs today, and we will continue to monitor. Summer [name] will follow up as directed. Orders and follow up as documented in patient record.  - Vitamin B12  7. Vitamin D deficiency Low  Vitamin D level contributes to fatigue and are associated with obesity, breast, and colon cancer. We will check labs today, and Hansini will follow-up for routine testing of Vitamin D, at least 2-3 times per year to avoid over-replacement.  - VITAMIN D 25 Hydroxy (Vit-D Deficiency, Fractures)  8. Depression screening Summer [name] had a positive depression screening. Depression is commonly associated with obesity and often results in emotional eating behaviors. We will monitor this closely and work on CBT to help improve the non-hunger eating patterns. Referral to Psychology may be required if no improvement is seen as she continues in our clinic.  9. Class 3 severe obesity with serious comorbidity and body mass index (BMI) of 45.0 to 49.9 in adult, unspecified obesity type Summer [name]) Summer [name] is currently in the action stage of change and her goal is to continue with weight loss efforts. I recommend Summer [name] begin the structured treatment plan as follows:  She has agreed to the Category 2 Plan + 100 calories.  Exercise goals: No exercise has been prescribed for now, while we concentrate on nutritional changes.  Behavioral modification strategies: increasing lean protein intake.  She was informed of the importance of frequent follow-up visits to maximize her success with intensive lifestyle modifications for her multiple health conditions. She was informed we would discuss her  lab results at her next visit unless there is a critical issue that needs to be addressed sooner. Summer [name] agreed to keep her next visit at the agreed upon time to discuss these results.  Objective:   Blood pressure 131/65, pulse 70, temperature 98 F (36.7 C), height 5\' 1"  (1.549 m), weight 251 lb (113.9 kg), SpO2 96 %. Body mass index is 47.43 kg/m.  EKG: Normal sinus rhythm, rate 72 BPM.  Indirect Calorimeter completed today shows a VO2 of 208 and a REE of 1446.  Her calculated basal metabolic rate is 8315 thus her basal metabolic rate is  worse than expected.  General: Cooperative, alert, well developed, in no acute distress. HEENT: Conjunctivae and lids unremarkable. Cardiovascular: Regular rhythm.  Lungs: Normal work of breathing. Neurologic: No focal deficits.   Lab Results  Component Value Date   CREATININE 1.30 (H) 05/13/2020   BUN 49 (H) 05/13/2020   NA 142 05/13/2020   K 5.0 05/13/2020   CL 104 05/13/2020   CO2 25 05/13/2020   Lab Results  Component Value Date   ALT 14 05/13/2020   AST 14 05/13/2020   ALKPHOS 85 05/13/2020   BILITOT 0.3 05/13/2020   Lab Results  Component Value Date   HGBA1C 6.7 (H) 05/13/2020   HGBA1C 6.7 (A) 03/12/2020   HGBA1C 6.0 (A) 11/03/2019   HGBA1C 7.2 (H) 04/02/2019   HGBA1C 8.1 (A) 08/27/2018   HGBA1C 8.1 08/27/2018   HGBA1C 8.1 (A) 08/27/2018   HGBA1C 8.1 (A) 08/27/2018   Lab Results  Component Value Date   INSULIN 78.8 (H) 05/13/2020   Lab Results  Component Value Date   TSH 4.630 (H) 05/13/2020   Lab Results  Component Value Date   CHOL 209 (H) 05/13/2020   HDL 38 (L) 05/13/2020   LDLCALC 117 (H) 05/13/2020   TRIG 309 (H) 05/13/2020   CHOLHDL 5.5 (H) 05/13/2020   Lab Results  Component Value Date   WBC 8.0 05/13/2020   HGB 11.1 05/13/2020   HCT 33.7 (L) 05/13/2020   MCV 87 05/13/2020   PLT 153 05/13/2020   No results found for: IRON, TIBC, FERRITIN Obesity Behavioral Intervention:   Approximately 15 minutes were spent on the discussion below.  ASK: We discussed the diagnosis of obesity with Summer [name] today and Jammi agreed to give [place] permission to discuss obesity behavioral modification therapy today.  ASSESS: Copelyn has the diagnosis of obesity and her BMI today is 47.45. Omolara is in the action stage of change.   ADVISE: Vennesa was educated on the multiple health risks of obesity as well as the benefit of weight loss to improve her health. She was advised of the need for long term treatment and the importance of lifestyle modifications to improve  her current health and to decrease her risk of future health problems.  AGREE: Multiple dietary modification options and treatment options were discussed and Jeanett agreed to follow the recommendations documented in the above note.  ARRANGE: Sharan was educated on the importance of frequent visits to treat obesity as outlined per CMS and USPSTF guidelines and agreed to schedule her next follow up appointment today.  Attestation Statements:   Reviewed by clinician on day of visit: allergies, medications, problem list, medical history, surgical history, family history, social history, and previous encounter notes.   I, Trixie Dredge, am acting as transcriptionist for Dennard Nip, MD.  I have reviewed the above documentation for accuracy and completeness, and I agree with the above. - [employer],  MD   

## 2020-05-20 NOTE — Telephone Encounter (Signed)
Pt left message checking status of diabetic shoes...  Returned call and as of now it looks like shoes/inserts should be shipping anytime(backordered) and I will call when they come in. I did ask pt to please call me if she does not hear from me in 2 wks or so but I hope to be calling her next week.

## 2020-05-24 DIAGNOSIS — E119 Type 2 diabetes mellitus without complications: Secondary | ICD-10-CM | POA: Insufficient documentation

## 2020-05-24 DIAGNOSIS — E538 Deficiency of other specified B group vitamins: Secondary | ICD-10-CM | POA: Insufficient documentation

## 2020-05-24 DIAGNOSIS — E559 Vitamin D deficiency, unspecified: Secondary | ICD-10-CM | POA: Insufficient documentation

## 2020-05-24 DIAGNOSIS — R5383 Other fatigue: Secondary | ICD-10-CM | POA: Insufficient documentation

## 2020-05-25 ENCOUNTER — Ambulatory Visit (INDEPENDENT_AMBULATORY_CARE_PROVIDER_SITE_OTHER): Payer: Medicare HMO | Admitting: Psychology

## 2020-05-25 DIAGNOSIS — F331 Major depressive disorder, recurrent, moderate: Secondary | ICD-10-CM

## 2020-05-25 DIAGNOSIS — F411 Generalized anxiety disorder: Secondary | ICD-10-CM | POA: Diagnosis not present

## 2020-05-27 ENCOUNTER — Ambulatory Visit (INDEPENDENT_AMBULATORY_CARE_PROVIDER_SITE_OTHER): Payer: Medicare HMO | Admitting: Family Medicine

## 2020-05-27 ENCOUNTER — Other Ambulatory Visit: Payer: Self-pay

## 2020-05-27 ENCOUNTER — Encounter (INDEPENDENT_AMBULATORY_CARE_PROVIDER_SITE_OTHER): Payer: Self-pay | Admitting: Family Medicine

## 2020-05-27 VITALS — BP 114/52 | HR 76 | Temp 98.1°F | Ht 61.0 in | Wt 246.0 lb

## 2020-05-27 DIAGNOSIS — E782 Mixed hyperlipidemia: Secondary | ICD-10-CM

## 2020-05-27 DIAGNOSIS — N1832 Chronic kidney disease, stage 3b: Secondary | ICD-10-CM | POA: Diagnosis not present

## 2020-05-27 DIAGNOSIS — Z794 Long term (current) use of insulin: Secondary | ICD-10-CM | POA: Diagnosis not present

## 2020-05-27 DIAGNOSIS — E1121 Type 2 diabetes mellitus with diabetic nephropathy: Secondary | ICD-10-CM

## 2020-05-27 DIAGNOSIS — Z6841 Body Mass Index (BMI) 40.0 and over, adult: Secondary | ICD-10-CM | POA: Diagnosis not present
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
## 2020-05-28 ENCOUNTER — Other Ambulatory Visit: Payer: Self-pay | Admitting: Family Medicine

## 2020-05-28 DIAGNOSIS — M25561 Pain in right knee: Secondary | ICD-10-CM | POA: Diagnosis not present

## 2020-05-28 DIAGNOSIS — E039 Hypothyroidism, unspecified: Secondary | ICD-10-CM

## 2020-05-28 DIAGNOSIS — E1169 Type 2 diabetes mellitus with other specified complication: Secondary | ICD-10-CM

## 2020-05-28 DIAGNOSIS — M6281 Muscle weakness (generalized): Secondary | ICD-10-CM | POA: Diagnosis not present

## 2020-05-28 DIAGNOSIS — M170 Bilateral primary osteoarthritis of knee: Secondary | ICD-10-CM | POA: Diagnosis not present

## 2020-05-28 DIAGNOSIS — M25562 Pain in left knee: Secondary | ICD-10-CM | POA: Diagnosis not present

## 2020-05-28 DIAGNOSIS — G8929 Other chronic pain: Secondary | ICD-10-CM | POA: Diagnosis not present

## 2020-05-30 NOTE — Progress Notes (Signed)
Chief Complaint:   OBESITY Summer [name] is here to discuss her progress with her obesity treatment plan along with follow-up of her obesity related diagnoses. Summer [name] is on the Category 2 Plan + 100 calories and states she is following her eating plan approximately 40% of the time. Summer [name] states she is doing 0 minutes 0 times per week.  Today's visit was #: 2 Starting weight: 251 lbs Starting date: 05/13/2020 Today's weight: 246 lbs Today's date: 05/27/2020 Total lbs lost to date: 5 Total lbs lost since last in-office visit: 5  Interim History: Summer [name] has done well with weight loss on her plan, but she had to deviate somewhat with dinner as her husband does the cooking, and she didn't really follow her plan. Her hunger was controlled.  Subjective:   1. Type 2 diabetes mellitus with renal insuffiency, stage 3b GFR 41, with long-term current use of insulin ([name]) Summer [name]'s fasting BGs range between 114 and 330, mostly between 110 and 150. She is on 70/30 due to finances and she rarely becomes hypoglycemic. I discussed labs with the patient today.  2. Mixed hyperlipidemia Summer [name] is working on diet and weight loss in addition to her medications. She denies chest pain. I discussed labs with the patient today.  Assessment/Plan:   1. Type 2 diabetes mellitus with renal insuffiency, stage 3b GFR 41, with long-term current use of insulin ([name]) Good blood sugar control is important to decrease the likelihood of diabetic complications such as nephropathy, neuropathy, limb loss, blindness, coronary artery disease, and death. Intensive lifestyle modification including diet, exercise and weight loss are the first line of treatment for diabetes. Summer [name] will continue diet and exercise, and will continue to watch for signs of hypoglycemia.  2. Mixed hyperlipidemia Cardiovascular risk and specific lipid/LDL goals reviewed. We discussed several lifestyle modifications today and Summer [name] will continue her Category 2  plan, and will continue to work on exercise and weight loss efforts. Orders and follow up as documented in patient record.   3. Class 3 severe obesity with serious comorbidity and body mass index (BMI) of 45.0 to 49.9 in adult, unspecified obesity type Summer [name]) Summer [name] is currently in the action stage of change. As such, her goal is to continue with weight loss efforts. She has agreed to the Category 2 Plan.   Behavioral modification strategies: increasing lean protein intake, meal planning and cooking strategies and dealing with family or coworker sabotage.  Summer [name] has agreed to follow-up with our clinic in 2 to 3 weeks with [facility], FNP-C. She was informed of the importance of frequent follow-up visits to maximize her success with intensive lifestyle modifications for her multiple health conditions.   Objective:   Blood pressure (!) 114/52, pulse 76, temperature 98.1 F (36.7 C), height 5\' 1"  (1.549 m), weight 246 lb (111.6 kg), SpO2 95 %. Body mass index is 46.48 kg/m.  General: Cooperative, alert, well developed, in no acute distress. HEENT: Conjunctivae and lids unremarkable. Cardiovascular: Regular rhythm.  Lungs: Normal work of breathing. Neurologic: No focal deficits.   Lab Results  Component Value Date   CREATININE 1.30 (H) 05/13/2020   BUN 49 (H) 05/13/2020   NA 142 05/13/2020   K 5.0 05/13/2020   CL 104 05/13/2020   CO2 25 05/13/2020   Lab Results  Component Value Date   ALT 14 05/13/2020   AST 14 05/13/2020   ALKPHOS 85 05/13/2020   BILITOT 0.3 05/13/2020   Lab Results  Component Value Date   HGBA1C  6.7 (H) 05/13/2020   HGBA1C 6.7 (A) 03/12/2020   HGBA1C 6.0 (A) 11/03/2019   HGBA1C 7.2 (H) 04/02/2019   HGBA1C 8.1 (A) 08/27/2018   HGBA1C 8.1 08/27/2018   HGBA1C 8.1 (A) 08/27/2018   HGBA1C 8.1 (A) 08/27/2018   Lab Results  Component Value Date   INSULIN 78.8 (H) 05/13/2020   Lab Results  Component Value Date   TSH 4.630 (H) 05/13/2020   Lab Results   Component Value Date   CHOL 209 (H) 05/13/2020   HDL 38 (L) 05/13/2020   LDLCALC 117 (H) 05/13/2020   TRIG 309 (H) 05/13/2020   CHOLHDL 5.5 (H) 05/13/2020   Lab Results  Component Value Date   WBC 8.0 05/13/2020   HGB 11.1 05/13/2020   HCT 33.7 (L) 05/13/2020   MCV 87 05/13/2020   PLT 153 05/13/2020   No results found for: IRON, TIBC, FERRITIN  Attestation Statements:   Reviewed by clinician on day of visit: allergies, medications, problem list, medical history, surgical history, family history, social history, and previous encounter notes.  Time spent on visit including pre-visit chart review and post-visit care and charting was 40 minutes.    I, Trixie Dredge, am acting as transcriptionist for Dennard Nip, MD.  I have reviewed the above documentation for accuracy and completeness, and I agree with the above. -  Dennard Nip, MD

## 2020-06-01 ENCOUNTER — Other Ambulatory Visit: Payer: Self-pay

## 2020-06-01 ENCOUNTER — Telehealth (INDEPENDENT_AMBULATORY_CARE_PROVIDER_SITE_OTHER): Payer: Medicare HMO | Admitting: Psychology

## 2020-06-01 DIAGNOSIS — F3289 Other specified depressive episodes: Secondary | ICD-10-CM

## 2020-06-02 NOTE — Progress Notes (Unsigned)
Office: [phone]  /  Fax: [phone]    Date: June 16, 2020   Appointment Start Time: *** Duration: *** minutes Provider: Glennie Isle, Psy.D. Type of Session: Individual Therapy  Location of Patient: {gbptloc:23249} Location of Provider: Provider's Home Type of Contact: Telepsychological Visit via MyChart Video Visit  Session Content: Shayanna is a 69 y.o. female presenting for a follow-up appointment to address the previously established treatment goal of increasing coping skills. Today's appointment was a telepsychological visit due to COVID-19. Sidni provided verbal consent for today's telepsychological appointment and she is aware she is responsible for securing confidentiality on her end of the session. Prior to proceeding with today's appointment, Keighley's physical location at the time of this appointment was obtained as well a phone number she could be reached at in the event of technical difficulties. Harly and this provider participated in today's telepsychological service.   This provider conducted a brief check-in and verbally administered the PHQ-9 and GAD-7. *** Sharelle was receptive to today's appointment as evidenced by openness to sharing, responsiveness to feedback, and {gbreceptiveness:23401}.  Mental Status Examination:  Appearance: {Appearance:22431} Behavior: {Behavior:22445} Mood: {gbmood:21757} Affect: {Affect:22436} Speech: {Speech:22432} Eye Contact: {Eye Contact:22433} Psychomotor Activity: {Motor Activity:22434} Gait: {gbgait:23404} Thought Process: {thought process:22448}  Thought Content/Perception: {disturbances:22451} Orientation: {Orientation:22437} Memory/Concentration: {gbcognition:22449} Insight/Judgment: {Insight:22446}  Structured Assessments Results: The Patient Health Questionnaire-9 (PHQ-9) is a self-report measure that assesses symptoms and severity of depression over the course of the last two weeks. Trenice obtained a score of ***  suggesting {GBPHQ9SEVERITY:21752}. Dominque finds the endorsed symptoms to be {gbphq9difficulty:21754}. [0= Not at all; 1= Several days; 2= More than half the days; 3= Nearly every day] Little interest or pleasure in doing things ***  Feeling down, depressed, or hopeless ***  Trouble falling or staying asleep, or sleeping too much ***  Feeling tired or having little energy ***  Poor appetite or overeating ***  Feeling bad about yourself --- or that you are a failure or have let yourself or your family down ***  Trouble concentrating on things, such as reading the newspaper or watching television ***  Moving or speaking so slowly that other people could have noticed? Or the opposite --- being so fidgety or restless that you have been moving around a lot more than usual ***  Thoughts that you would be better off dead or hurting yourself in some way ***  PHQ-9 Score ***    The Generalized Anxiety Disorder-7 (GAD-7) is a brief self-report measure that assesses symptoms of anxiety over the course of the last two weeks. Lauris obtained a score of *** suggesting {gbgad7severity:21753}. Jenasia finds the endorsed symptoms to be {gbphq9difficulty:21754}. 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 Jacquita has demonstrated progress in her goal as evidenced by  {gbtxprogress:22839}. Kamaya also {gbtxprogress2:22951}.  Plan: The next appointment will be scheduled in {gbweeks:21758}, which will be {  gbtxmodality:23402}. The next session will focus on {Plan for Next Appointment:23400}.

## 2020-06-03 DIAGNOSIS — R809 Proteinuria, unspecified: Secondary | ICD-10-CM | POA: Diagnosis not present

## 2020-06-03 DIAGNOSIS — I10 Essential (primary) hypertension: Secondary | ICD-10-CM | POA: Diagnosis not present

## 2020-06-03 DIAGNOSIS — N1832 Chronic kidney disease, stage 3b: Secondary | ICD-10-CM | POA: Diagnosis not present

## 2020-06-03 DIAGNOSIS — E1122 Type 2 diabetes mellitus with diabetic chronic kidney disease: Secondary | ICD-10-CM | POA: Diagnosis not present

## 2020-06-04 DIAGNOSIS — M47816 Spondylosis without myelopathy or radiculopathy, lumbar region: Secondary | ICD-10-CM | POA: Diagnosis not present

## 2020-06-06 ENCOUNTER — Encounter (INDEPENDENT_AMBULATORY_CARE_PROVIDER_SITE_OTHER): Payer: Self-pay | Admitting: Family Medicine

## 2020-06-07 NOTE — Telephone Encounter (Signed)
Please advise 

## 2020-06-08 ENCOUNTER — Ambulatory Visit: Payer: Medicare HMO | Admitting: Psychology

## 2020-06-10 ENCOUNTER — Telehealth: Payer: Self-pay | Admitting: Podiatry

## 2020-06-10 NOTE — Telephone Encounter (Signed)
Pt left message checking on diabetic shoes status..  Returned call after talking with [employer] shoes are on back order so I have asked for 2 additional styles. I have sent [employer] the message.

## 2020-06-11 ENCOUNTER — Other Ambulatory Visit: Payer: Self-pay

## 2020-06-11 ENCOUNTER — Ambulatory Visit
Admission: RE | Admit: 2020-06-11 | Discharge: 2020-06-11 | Disposition: A | Discharge status: Home / Self Care | Payer: Medicare HMO | Source: Ambulatory Visit | Attending: Nephrology | Admitting: Nephrology

## 2020-06-11 DIAGNOSIS — E860 Dehydration: Secondary | ICD-10-CM | POA: Insufficient documentation

## 2020-06-11 DIAGNOSIS — N183 Chronic kidney disease, stage 3 unspecified: Secondary | ICD-10-CM | POA: Diagnosis not present

## 2020-06-11 MED ORDER — SODIUM CHLORIDE 0.9 % IV SOLN: INTRAVENOUS | Status: AC

## 2020-06-14 NOTE — Progress Notes (Signed)
Office: [phone]  /  Fax: [phone]    Date: June 24, 2020   Appointment Start Time: 3:10pm Duration: 30 minutes Provider: Glennie Isle, Psy.D. Type of Session: Individual Therapy  Location of Patient: Parked in car at [place] near [facility] Location of Provider: Provider's Home Type of Contact: Telepsychological Visit via MyChart Video Visit  Session Content: Today's appointment was initiated late due to this provider. This provider called [employer] at 3:06pm. She explained she drove to the clinic and then realized it was a telepsychological visit. She was receptive to pulling over to a safe location to join today's appointment. As such, today's appointment was initiated 10 minutes late. Nanako is a 69 y.o. female presenting for a follow-up appointment to address the previously established treatment goal of increasing coping skills. Today's appointment was a telepsychological visit due to COVID-19. Flavia provided verbal consent for today's telepsychological appointment and she is aware she is responsible for securing confidentiality on her end of the session. Prior to proceeding with today's appointment, Rowynn's physical location at the time of this appointment was obtained as well a phone number she could be reached at in the event of technical difficulties. Khamryn and this provider participated in today's telepsychological service. Of note, today's appointment was switched to a regular telephone call at 3:18pm with Neaveh's verbal consent due to technical issues.  This provider conducted a brief check-in. Mary shared she was recently hospitalized due to dehydration. Regarding eating, she believes she recently gained a couple pounds. As such, her recent eating habits were explored. It was reflected she is not eating enough, especially protein. Lunch was identified as the most challenging meal due to being busy at work. Thus, this provider engaged Anayla in problem solving  to assist her to eat lunch regularly. She was receptive to setting an alarm every day at 12pm to remind her to eat. Psychoeducation regarding the consequences of not eating regularly/enough on metabolism, hunger, and weight loss was provided. She expressed willingness to pack her lunch daily along with snacks to ensure she is not going long periods without eating. Notably, this provider discussed her upcoming maternity leave toward the end of November. Clevie acknowledged understanding given the uncertain nature of the circumstances, this provider may be out of the office sooner. All questions/concerns were addressed. Judyth denied any concerns. Aloma was receptive to today's appointment as evidenced by openness to sharing, responsiveness to feedback, and willingness to implement discussed strategies .  Mental Status Examination:  Appearance: well groomed and appropriate hygiene  Behavior: appropriate to circumstances Mood: euthymic Affect: mood congruent Speech: normal in rate, volume, and tone Eye Contact: appropriate Psychomotor Activity: appropriate Gait: unable to assess Thought Process: linear, logical, and goal directed  Thought Content/Perception: no hallucinations, delusions, bizarre thinking or behavior reported or observed and no evidence of suicidal and homicidal ideation, plan, and intent Orientation: time, person, place, and purpose of appointment Memory/Concentration: memory, attention, language, and fund of knowledge intact  Insight/Judgment: fair  Interventions:  Conducted a brief chart review Provided empathic reflections and validation Employed supportive psychotherapy interventions to facilitate reduced distress and to improve coping skills with identified stressors Engaged patient in problem solving  DSM-5 Diagnosis(es): 311 (F32.8) Other Specified Depressive Disorder, Emotional Eating Behaviors  Treatment Goal & Progress: During the initial appointment with this provider,  the following treatment goal was established: increase coping skills. Progress is limited, as Azya has just begun treatment with this provider; however, she is receptive to the interaction  and interventions and rapport is being established.   Plan: The next appointment will be scheduled in two weeks, which will be via MyChart Video Visit. The next session will focus on working towards the established treatment goal.

## 2020-06-15 ENCOUNTER — Telehealth (INDEPENDENT_AMBULATORY_CARE_PROVIDER_SITE_OTHER): Payer: Medicare HMO | Admitting: Psychology

## 2020-06-15 DIAGNOSIS — M170 Bilateral primary osteoarthritis of knee: Secondary | ICD-10-CM | POA: Diagnosis not present

## 2020-06-15 DIAGNOSIS — M6281 Muscle weakness (generalized): Secondary | ICD-10-CM | POA: Diagnosis not present

## 2020-06-15 DIAGNOSIS — N1832 Chronic kidney disease, stage 3b: Secondary | ICD-10-CM | POA: Diagnosis not present

## 2020-06-15 DIAGNOSIS — G8929 Other chronic pain: Secondary | ICD-10-CM | POA: Diagnosis not present

## 2020-06-15 DIAGNOSIS — M25562 Pain in left knee: Secondary | ICD-10-CM | POA: Diagnosis not present

## 2020-06-15 DIAGNOSIS — M25561 Pain in right knee: Secondary | ICD-10-CM | POA: Diagnosis not present

## 2020-06-15 DIAGNOSIS — E1122 Type 2 diabetes mellitus with diabetic chronic kidney disease: Secondary | ICD-10-CM | POA: Diagnosis not present

## 2020-06-16 ENCOUNTER — Telehealth (INDEPENDENT_AMBULATORY_CARE_PROVIDER_SITE_OTHER): Payer: Medicare HMO | Admitting: Psychology

## 2020-06-17 ENCOUNTER — Encounter (INDEPENDENT_AMBULATORY_CARE_PROVIDER_SITE_OTHER): Payer: Self-pay | Admitting: Family Medicine

## 2020-06-17 ENCOUNTER — Ambulatory Visit (INDEPENDENT_AMBULATORY_CARE_PROVIDER_SITE_OTHER): Payer: Medicare HMO | Admitting: Family Medicine

## 2020-06-17 ENCOUNTER — Other Ambulatory Visit: Payer: Self-pay

## 2020-06-17 VITALS — BP 123/63 | HR 65 | Temp 97.8°F | Ht 61.0 in | Wt 247.0 lb

## 2020-06-17 DIAGNOSIS — Z794 Long term (current) use of insulin: Secondary | ICD-10-CM

## 2020-06-17 DIAGNOSIS — N1832 Chronic kidney disease, stage 3b: Secondary | ICD-10-CM

## 2020-06-17 DIAGNOSIS — E1122 Type 2 diabetes mellitus with diabetic chronic kidney disease: Secondary | ICD-10-CM | POA: Diagnosis not present

## 2020-06-17 DIAGNOSIS — Z6841 Body Mass Index (BMI) 40.0 and over, adult: Secondary | ICD-10-CM | POA: Diagnosis not present

## 2020-06-18 ENCOUNTER — Encounter: Payer: Self-pay | Admitting: Family Medicine

## 2020-06-18 ENCOUNTER — Ambulatory Visit (INDEPENDENT_AMBULATORY_CARE_PROVIDER_SITE_OTHER): Payer: Medicare HMO | Admitting: Family Medicine

## 2020-06-18 VITALS — BP 124/64 | HR 84 | Temp 97.7°F | Resp 14 | Ht 62.0 in | Wt 249.9 lb

## 2020-06-18 DIAGNOSIS — Z6841 Body Mass Index (BMI) 40.0 and over, adult: Secondary | ICD-10-CM

## 2020-06-18 DIAGNOSIS — I700 Atherosclerosis of aorta: Secondary | ICD-10-CM | POA: Diagnosis not present

## 2020-06-18 DIAGNOSIS — E039 Hypothyroidism, unspecified: Secondary | ICD-10-CM | POA: Diagnosis not present

## 2020-06-18 DIAGNOSIS — F5105 Insomnia due to other mental disorder: Secondary | ICD-10-CM

## 2020-06-18 DIAGNOSIS — F99 Mental disorder, not otherwise specified: Secondary | ICD-10-CM

## 2020-06-18 DIAGNOSIS — N1832 Chronic kidney disease, stage 3b: Secondary | ICD-10-CM | POA: Diagnosis not present

## 2020-06-18 DIAGNOSIS — F331 Major depressive disorder, recurrent, moderate: Secondary | ICD-10-CM

## 2020-06-18 DIAGNOSIS — Z23 Encounter for immunization: Secondary | ICD-10-CM

## 2020-06-18 DIAGNOSIS — E1169 Type 2 diabetes mellitus with other specified complication: Secondary | ICD-10-CM | POA: Diagnosis not present

## 2020-06-18 DIAGNOSIS — I10 Essential (primary) hypertension: Secondary | ICD-10-CM | POA: Diagnosis not present

## 2020-06-18 DIAGNOSIS — M170 Bilateral primary osteoarthritis of knee: Secondary | ICD-10-CM

## 2020-06-18 DIAGNOSIS — G4733 Obstructive sleep apnea (adult) (pediatric): Secondary | ICD-10-CM | POA: Diagnosis not present

## 2020-06-18 DIAGNOSIS — E785 Hyperlipidemia, unspecified: Secondary | ICD-10-CM

## 2020-06-18 MED ORDER — IRBESARTAN-HYDROCHLOROTHIAZIDE 300-12.5 MG PO TABS: 1.0000 | ORAL_TABLET | Freq: Every day | ORAL | 1 refills | Status: DC

## 2020-06-18 MED ORDER — SERTRALINE HCL 100 MG PO TABS: 150.0000 mg | ORAL_TABLET | Freq: Every day | ORAL | 0 refills | Status: DC

## 2020-06-18 MED ORDER — ROSUVASTATIN CALCIUM 20 MG PO TABS: 20.0000 mg | ORAL_TABLET | Freq: Every day | ORAL | 1 refills | Status: DC

## 2020-06-18 MED ORDER — TRAZODONE HCL 50 MG PO TABS: 50.0000 mg | ORAL_TABLET | Freq: Every evening | ORAL | 1 refills | Status: DC | PRN

## 2020-06-18 MED ORDER — BUPROPION HCL ER (XL) 150 MG PO TB24: 150.0000 mg | ORAL_TABLET | Freq: Every day | ORAL | 0 refills | Status: DC

## 2020-06-18 MED ORDER — ICOSAPENT ETHYL 1 G PO CAPS: 2.0000 g | ORAL_CAPSULE | Freq: Two times a day (BID) | ORAL | 5 refills | Status: DC

## 2020-06-18 NOTE — Progress Notes (Signed)
Name: Summer [name]   MRN: [id number]    DOB: 1951-08-29   Date:06/18/2020       Progress Note  Subjective  Chief Complaint  Chief Complaint  Patient presents with  . Follow-up    HPI  Advanced diffuse lumbar disc and facet degeneration causing severe spinal stenosis from L1-2 to L4-5 : she saw  Dr. Lacinda Axon - neurosurgeon / [facility], he advised conservative measures with Dr. Chasniss/psyatrist . She had injections on her spine,  was given tramadol , advised to continue Tylenol and Lyrica 300 mg BID . She recently had nerve ablation , she states at this time pain is unchanged 8/10 but she was told that it takes 6-8 weeks to noticed improvement   Hypothyroidism: she states hair loss is stable, no dysphagia, no change in bowel movements. On levothyroxine 25 mg daily and one and half on Sundays , last level was at goal  Morbid Obesity: she is doing better , weight is below 250 lbs, she is following a healthier diet   MDD: she is currently taking Zoloft 150 mg daily, off duloxetine , but taking  modafinil and has noticed increase in energy . She still wants to have bariatric surgery, waiting for approval by therapist, she is concerned about her health and cannot wait to start feeling better. I am prescribing her medications, phq 9 has improved, she is willing to try wellbutrin   Atherosclerosis of aorta: found on CT abdomen, discussed importance of continues statin therapy , she states not taking lovaza, she would like another rx for vascepa and see if it gets approved, she states it was not at the pharmacy, may needs PA  Diabetes : she is under the care of Endocrinologist, Dr. Honor Junes , last A1C was 6.7% on 05/13/2020. She has dyslipidemia, obesity, CKI, and HTN associated with DM. She has occasional hypoglycemia and has been skipping morning dose if glucose is low in the morning, she will discuss it with Dr. Honor Junes . She is compliant with medication , eye exam is up to date, also sees  podiatrist and is waiting for diabetic shoes . Metformin is at hold by Dr. Catha Nottingham stage II: GFR is stable, she is on ARB, bp is controlled, seeing nephrologist - Dr. Holley Raring. She has good urine output and no pruritis   Dyslipidemia: had labs done 05/13/2020 HDL is slightly better, LDL still above goal at 117 , triglycerides was high , she stopped taking fish oil   OSA: she continues to wake up feeling tired, we started Mondafinil this year and it has helped with energy , she is still not wearing CPAP machine, unable to tolerate it  HTN: bp was high when she arrived but improved with rest, bp at home has been around 130's/80's, no chest pain or palpitation, she has SOB with activity but stable. Seeing Dr. Saunders Revel for SOB, likely multifactorial .   OA: she has long history of bilateral knee pain, she has difficulty walking, standing, using a roller walker  to help with ambulation, no effusion but pops and hurts, she was seen at [facility] and had injections ( not sure of the type), she is now having PT , she still has pain that is described as aching, worse when standing  Patient Active Problem List   Diagnosis Date Noted  . Other fatigue 05/24/2020  . Diabetes mellitus ([place]) 05/24/2020  . B12 deficiency 05/24/2020  . Vitamin D deficiency 05/24/2020  . Metatarsalgia of left foot  01/15/2020  . Preop cardiovascular exam 07/18/2019  . Major depressive disorder, recurrent episode, moderate ([place]) 04/04/2019  . Left arm weakness 04/03/2019  . (HFpEF) heart failure with preserved ejection fraction ([place]) 08/31/2017  . Moderate persistent asthma 08/31/2017  . Hyperlipidemia associated with type 2 diabetes mellitus ([place]) 06/14/2017  . Dyslipidemia associated with type 2 diabetes mellitus ([place]) 08/18/2016  . Charcot foot due to diabetes mellitus ([place]) 02/22/2016  . Acquired abduction deformity of foot 07/12/2015  . Osteoarthritis of subtalar joint 07/12/2015  . Poorly controlled type 2 diabetes  mellitus with neuropathy ([place]) 07/12/2015  . Arthritis of foot, degenerative 07/12/2015  . Carpal tunnel syndrome 04/17/2015  . Chronic constipation 04/17/2015  . Insomnia, persistent 04/17/2015  . Stage 3 chronic kidney disease ([place]) 04/17/2015  . Decreased exercise tolerance 04/17/2015  . Diabetes mellitus with polyneuropathy ([place]) 04/17/2015  . Gastro-esophageal reflux disease without esophagitis 04/17/2015  . Bursitis, trochanteric 04/17/2015  . Cephalalgia 04/17/2015  . Hypertension associated with diabetes ([place]) 04/17/2015  . Adult hypothyroidism 04/17/2015  . Hearing loss 04/17/2015  . Chronic recurrent major depressive disorder ([place]) 04/17/2015  . Neurogenic claudication ([place]) 04/17/2015  . Class 3 severe obesity with serious comorbidity and body mass index (BMI) of 45.0 to 49.9 in adult ([place]) 04/17/2015  . Hypo-ovarianism 04/17/2015  . Perennial allergic rhinitis with seasonal variation 04/17/2015  . Acne erythematosa 04/17/2015  . Dyskinesia, tardive 04/17/2015  . Memory loss 04/17/2015  . Impingement syndrome of shoulder 04/17/2015  . Dermatitis, stasis 04/17/2015  . Obstructive sleep apnea 05/14/2014  . Shortness of breath on exertion 05/06/2014  . Mixed hyperlipidemia 02/06/2012  . LBP (low back pain) 09/16/2008    Past Surgical History:  Procedure Laterality Date  . ABDOMINAL HYSTERECTOMY  1975  . ANKLE SURGERY Left approx Jan 2018  . APPENDECTOMY  1970  . CATARACT EXTRACTION  01/2011   right  . COLONOSCOPY WITH PROPOFOL N/A 08/19/2019   Procedure: COLONOSCOPY WITH PROPOFOL;  Surgeon: Jonathon Bellows, MD;  Location: [facility] ENDOSCOPY;  Service: Gastroenterology;  Laterality: N/A;  . eye lid surgery  2013   bilateral  . FOOT SURGERY    . NECK SURGERY    . SPINE SURGERY    . TUBAL LIGATION    . VAGINAL HYSTERECTOMY  1989    Family History  Problem Relation Age of Onset  . Aneurysm Mother   . Aortic aneurysm Mother   . Hypertension Mother   . Hyperlipidemia  Mother   . Heart disease Mother   . Obesity Mother   . Heart attack Maternal Grandfather   . Diabetes Maternal Grandfather     Social History   Tobacco Use  . Smoking status: Never Smoker  . Smokeless tobacco: Never Used  Substance Use Topics  . Alcohol use: No    Alcohol/week: 0.0 standard drinks     Current Outpatient Medications:  .  ACCU-CHEK SOFTCLIX LANCETS lancets, , Disp: , Rfl:  .  acetaminophen (TYLENOL 8 HOUR) 650 MG CR tablet, Take 1 tablet (650 mg total) by mouth every 8 (eight) hours as needed for pain., Disp: 90 tablet, Rfl: 2 .  Alcohol Swabs (B-D SINGLE USE SWABS REGULAR) PADS, , Disp: , Rfl:  .  ASPIRIN LOW DOSE 81 MG EC tablet, TAKE 1 TABLET (81 MG TOTAL) BY MOUTH DAILY., Disp: 30 tablet, Rfl: 0 .  Blood Glucose Monitoring Suppl (ACCU-CHEK AVIVA PLUS) w/Device KIT, , Disp: , Rfl:  .  Calcium Carb-Cholecalciferol (CALCIUM 600+D3 PO), Take by mouth., Disp: ,  Rfl:  .  Cyanocobalamin (VITAMIN B12 PO), Take 1 tablet by mouth daily. , Disp: , Rfl:  .  furosemide (LASIX) 20 MG tablet, Take 1 tablet (20 mg total) by mouth daily., Disp: 90 tablet, Rfl: 1 .  icosapent Ethyl (VASCEPA) 1 g capsule, Take 2 capsules (2 g total) by mouth 2 (two) times daily., Disp: 120 capsule, Rfl: 5 .  insulin NPH-regular Human (70-30) 100 UNIT/ML injection, Inject 100 Units into the skin 2 (two) times daily with a meal., Disp: , Rfl:  .  irbesartan-hydrochlorothiazide (AVALIDE) 300-12.5 MG tablet, Take 1 tablet by mouth daily., Disp: 90 tablet, Rfl: 0 .  levothyroxine (SYNTHROID) 25 MCG tablet, Take 1 tablet by mouth daily except, take 1 and 1/2 tablets by mouth on Sunday., Disp: 100 tablet, Rfl: 0 .  metFORMIN (GLUCOPHAGE) 500 MG tablet, , Disp: , Rfl:  .  metoprolol tartrate (LOPRESSOR) 25 MG tablet, Take 1 tablet (25 mg total) by mouth 2 (two) times daily., Disp: 180 tablet, Rfl: 1 .  metroNIDAZOLE (METROCREAM) 0.75 % cream, Apply topically in the morning and at bedtime., Disp: 45 g, Rfl:  0 .  Miconazole Nitrate 2 % POWD, Apply to skin folds twice a day, Disp: 100 g, Rfl: 0 .  Misc Natural Products (OSTEO BI-FLEX ADV JOINT SHIELD PO), Take by mouth 2 (two) times daily. , Disp: , Rfl:  .  modafinil (PROVIGIL) 100 MG tablet, Take 1 tablet (100 mg total) by mouth daily., Disp: 90 tablet, Rfl: 0 .  montelukast (SINGULAIR) 10 MG tablet, Take 1 tablet (10 mg total) by mouth at bedtime., Disp: 90 tablet, Rfl: 1 .  mupirocin ointment (BACTROBAN) 2 %, Apply 1 application topically 2 (two) times daily., Disp: 22 g, Rfl: 0 .  omeprazole (PRILOSEC) 40 MG capsule, Take 1 capsule by mouth daily., Disp: 90 capsule, Rfl: 1 .  Potassium 99 MG TABS, Take by mouth daily. , Disp: , Rfl:  .  pregabalin (LYRICA) 300 MG capsule, Take 300 mg by mouth 2 (two) times a day., Disp: , Rfl:  .  Probiotic Product (PROBIOTIC-10 PO), Take by mouth., Disp: , Rfl:  .  RELION INSULIN SYRINGE 1ML/31G 31G X 5/16" 1 ML MISC, , Disp: , Rfl:  .  rosuvastatin (CRESTOR) 20 MG tablet, Take 1 tablet by mouth daily., Disp: 90 tablet, Rfl: 0 .  sertraline (ZOLOFT) 100 MG tablet, Take 1 and 1/2 tablets by mouth daily., Disp: 135 tablet, Rfl: 0 .  tiZANidine (ZANAFLEX) 4 MG tablet, , Disp: , Rfl:  .  traZODone (DESYREL) 50 MG tablet, Take 1 tablet (50 mg total) by mouth at bedtime as needed for sleep., Disp: 90 tablet, Rfl: 1 .  amLODipine (NORVASC) 5 MG tablet, Take 1 tablet (5 mg total) by mouth daily., Disp: 90 tablet, Rfl: 1 .  vitamin C (ASCORBIC ACID) 500 MG tablet, Take 500 mg by mouth 2 (two) times daily.  (Patient not taking: Reported on 06/18/2020), Disp: , Rfl:  .  VITAMIN D, CHOLECALCIFEROL, PO, Take 1,000 Units by mouth daily.  (Patient not taking: Reported on 06/18/2020), Disp: , Rfl:   Allergies  Allergen Reactions  . Codeine Other (See Comments)    "TRIPPED OUT"  DIDN'T LIKE THE [place]  . Atorvastatin     muscle pain  . Hydrocodone     itching  . Tramadol   . Latex Rash  . Zolpidem Other (See  Comments)    Sleep walk    I personally reviewed active  problem list, medication list, allergies, family history, social history, health maintenance with the patient/caregiver today.   ROS  Constitutional: Negative for fever or weight change.  Respiratory: Negative for cough and shortness of breath.   Cardiovascular: Negative for chest pain or palpitations.  Gastrointestinal: Negative for abdominal pain, no bowel changes.  Musculoskeletal: Positive  for gait problem and knee joint swelling.  Skin: Negative for rash.  Neurological: Negative for dizziness or headache.  No other specific complaints in a complete review of systems (except as listed in HPI above).  Objective  Vitals:   06/18/20 1431 06/18/20 1440  BP: (!) 150/70 124/64  Pulse: 84   Resp: 14   Temp: 97.7 F (36.5 C)   SpO2: 94%   Weight: 249 lb 14.4 oz (113.4 kg)   Height: _0  (1.575 m)     Body mass index is 45.71 kg/m.  Physical Exam  Constitutional: Patient appears well-developed and well-nourished. Obese No distress.  HEENT: head atraumatic, normocephalic, pupils equal and reactive to light, neck supple Cardiovascular: Normal rate, regular rhythm and normal heart sounds.  No murmur heard. No BLE edema. Pulmonary/Chest: Effort normal and breath sounds normal. No respiratory distress. Abdominal: Soft.  There is no tenderness. Psychiatric: Patient has a normal mood and affect. behavior is normal. Judgment and thought content normal. Muscular skeletal: crepitus with extension of knees, slow gait and uses a walker   Recent Results (from the past 2160 hour(s))  Vitamin B12     Status: Abnormal   Collection Time: 05/13/20 11:46 AM  Result Value Ref Range   Vitamin B-12 >2000 (H) 232 - 1245 pg/mL  CBC with Differential/Platelet     Status: Abnormal   Collection Time: 05/13/20 11:46 AM  Result Value Ref Range   WBC 8.0 3.4 - 10.8 x10E3/uL   RBC 3.86 3.77 - 5.28 x10E6/uL   Hemoglobin 11.1 11.1 - 15.9 g/dL    Hematocrit 33.7 (L) 34.0 - 46.6 %   MCV 87 79 - 97 fL   MCH 28.8 26.6 - 33.0 pg   MCHC 32.9 31 - 35 g/dL   RDW 15.0 11.7 - 15.4 %   Platelets 153 150 - 450 x10E3/uL   Neutrophils 66 Not Estab. %   Lymphs 17 Not Estab. %   Monocytes 10 Not Estab. %   Eos 5 Not Estab. %   Basos 1 Not Estab. %   Neutrophils Absolute 5.3 1 - 7 x10E3/uL   Lymphocytes Absolute 1.3 0 - 3 x10E3/uL   Monocytes Absolute 0.8 0 - 0 x10E3/uL   EOS (ABSOLUTE) 0.4 0.0 - 0.4 x10E3/uL   Basophils Absolute 0.1 0 - 0 x10E3/uL   Immature Granulocytes 1 Not Estab. %   Immature Grans (Abs) 0.1 0.0 - 0.1 x10E3/uL  Comprehensive metabolic panel     Status: Abnormal   Collection Time: 05/13/20 11:46 AM  Result Value Ref Range   Glucose 85 65 - 99 mg/dL   BUN 49 (H) 8 - 27 mg/dL   Creatinine, Ser 1.30 (H) 0.57 - 1.00 mg/dL   GFR calc non Af Amer 41 (L) >59 mL/min/1.73   GFR calc Af Amer 48 (L) >59 mL/min/1.73    Comment: **[facility] currently reports eGFR in compliance with the current**   recommendations of the [employer]. [facility] will   update reporting as new guidelines are published from the NKF-ASN   Task force.    BUN/Creatinine Ratio 38 (H) 12 - 28   Sodium 142 134 - 144  mmol/L   Potassium 5.0 3.5 - 5.2 mmol/L   Chloride 104 96 - 106 mmol/L   CO2 25 20 - 29 mmol/L   Calcium 9.9 8.7 - 10.3 mg/dL   Total Protein 6.4 6.0 - 8.5 g/dL   Albumin 4.5 3.7 - 4.7 g/dL   Globulin, Total 1.9 1.5 - 4.5 g/dL   Albumin/Globulin Ratio 2.4 (H) 1.2 - 2.2   Bilirubin Total 0.3 0.0 - 1.2 mg/dL   Alkaline Phosphatase 85 48 - 121 IU/L    Comment: **Effective May 24, 2020 Alkaline Phosphatase**   reference interval will be changing to:              Age                Female          Female           0 -  5 days         39 - 127       52 - 127           6 - 10 days         55 - 242       85 - 242          45 - 20 days        109 - 357      109 - 357          21 - 30 days         94 - 267       12 - 494            1 -  2 months      149 - 539      149 - 539           3 -  6 months      131 - 452      131 - 452           7 - 11 months      117 - 401      117 - 401   12 months -  6 years       158 - 369      158 - 369           7 - 12 years       150 - 409      150 - 409               13 years       156 - 435       70 - 227               14 years       114 - 375       52 - 161               65 years        80 - 279       37 - 134               55 years        61 - 207       78 - 121               58 years        10 - 161       75 - 113  18 - 20 years        22 - 125       50 - 106              >20 years         44 - 121       44 - 121    AST 14 0 - 40 IU/L   ALT 14 0 - 32 IU/L  Folate     Status: None   Collection Time: 05/13/20 11:46 AM  Result Value Ref Range   Folate 8.8 >3.0 ng/mL    Comment: A serum folate concentration of less than 3.1 ng/mL is considered to represent clinical deficiency.   Hemoglobin A1c     Status: Abnormal   Collection Time: 05/13/20 11:46 AM  Result Value Ref Range   Hgb A1c MFr Bld 6.7 (H) 4.8 - 5.6 %    Comment:          Prediabetes: 5.7 - 6.4          Diabetes: >6.4          Glycemic control for adults with diabetes: <7.0    Est. average glucose Bld gHb Est-mCnc 146 mg/dL  Insulin, random     Status: Abnormal   Collection Time: 05/13/20 11:46 AM  Result Value Ref Range   INSULIN 78.8 (H) 2.6 - 24.9 uIU/mL  Lipid panel     Status: Abnormal   Collection Time: 05/13/20 11:46 AM  Result Value Ref Range   Cholesterol, Total 209 (H) 100 - 199 mg/dL   Triglycerides 309 (H) 0 - 149 mg/dL   HDL 38 (L) >39 mg/dL   VLDL Cholesterol Cal 54 (H) 5 - 40 mg/dL   LDL Chol Calc (NIH) 117 (H) 0 - 99 mg/dL   Chol/HDL Ratio 5.5 (H) 0.0 - 4.4 ratio    Comment:                                   T. Chol/HDL Ratio                                             Men  Women                               1/2 Avg.Risk  3.4    3.3                                    Avg.Risk  5.0    4.4                                2X Avg.Risk  9.6    7.1                                3X Avg.Risk 23.4   11.0   T3     Status: None   Collection Time: 05/13/20 11:46 AM  Result Value Ref Range   T3, Total 72 71 - 180 ng/dL  T4     Status:  None   Collection Time: 05/13/20 11:46 AM  Result Value Ref Range   T4, Total 5.1 4.5 - 12.0 ug/dL  TSH     Status: Abnormal   Collection Time: 05/13/20 11:46 AM  Result Value Ref Range   TSH 4.630 (H) 0.450 - 4.500 uIU/mL  VITAMIN D 25 Hydroxy (Vit-D Deficiency, Fractures)     Status: None   Collection Time: 05/13/20 11:46 AM  Result Value Ref Range   Vit D, 25-Hydroxy 41.1 30.0 - 100.0 ng/mL    Comment: Vitamin D deficiency has been defined by the [place] practice guideline as a level of serum 25-OH vitamin D less than 20 ng/mL (1,2). The Endocrine Society went on to further define vitamin D insufficiency as a level between 21 and 29 ng/mL (2). 1. IOM (Institute of Medicine). 2010. Dietary reference    intakes for calcium and D. [place]: The    [employer]. 2. Holick MF, Binkley [place], Bischoff-Ferrari HA, et al.    Evaluation, treatment, and prevention of vitamin D    deficiency: an Endocrine Society clinical practice    guideline. JCEM. 2011 Jul; 96(7):1911-30.       PHQ2/9: Depression screen [facility] 2/9 06/18/2020 05/13/2020 03/12/2020 12/30/2019 11/03/2019  Decreased Interest 0 3 0 0 2  Down, Depressed, Hopeless 3 3 0 0 1  PHQ - 2 Score 3 6 0 0 3  Altered sleeping _0 0 2  Tired, decreased energy 3 3 0 0 3  Change in appetite 0 2 0 2 2  Feeling bad or failure about yourself  0 1 0 1 1  Trouble concentrating 0 1 0 0 1  Moving slowly or fidgety/restless 1 0 0 0 0  Suicidal thoughts 0 0 0 0 0  PHQ-9 Score _1 Difficult doing work/chores Somewhat difficult Somewhat difficult Not difficult at all Somewhat difficult Very difficult  Some recent data might be  hidden    phq 9 is positive   Fall Risk: Fall Risk  06/18/2020 03/12/2020 02/20/2020 12/30/2019 10/14/2019  Falls in the past year? 0 0 0 0 0  Comment - - - - -  Number falls in past yr: 0 0 - 0 0  Injury with Fall? 0 0 - 0 0  Risk Factor Category  - - - - -  Comment - - - - -  Risk for fall due to : - Impaired balance/gait;Impaired mobility Impaired mobility - -  Risk for fall due to: Comment - - uses walker - -  Follow up - - - - -     Functional Status Survey: Is the patient deaf or have difficulty hearing?: No Does the patient have difficulty seeing, even when wearing glasses/contacts?: No Does the patient have difficulty concentrating, remembering, or making decisions?: Yes Does the patient have difficulty walking or climbing stairs?: Yes Does the patient have difficulty dressing or bathing?: No Does the patient have difficulty doing errands alone such as visiting a doctor's office or shopping?: No    Assessment & Plan  1. Need for influenza vaccination  - Flu Vaccine QUAD High Dose(Fluad)  2. Dyslipidemia associated with type 2 diabetes mellitus ([name])  - irbesartan-hydrochlorothiazide (AVALIDE) 300-12.5 MG tablet; Take 1 tablet by mouth daily.  Dispense: 90 tablet; Refill: 1 - icosapent Ethyl (VASCEPA) 1 g capsule; Take 2 capsules (2 g total) by mouth 2 (two) times daily.  Dispense: 120 capsule; Refill: 5 -  rosuvastatin (CRESTOR) 20 MG tablet; Take 1 tablet (20 mg total) by mouth daily.  Dispense: 90 tablet; Refill: 1  3. Atherosclerosis of aorta ([name])  - icosapent Ethyl (VASCEPA) 1 g capsule; Take 2 capsules (2 g total) by mouth 2 (two) times daily.  Dispense: 120 capsule; Refill: 5  4. Insomnia due to other mental disorder  - traZODone (DESYREL) 50 MG tablet; Take 1 tablet (50 mg total) by mouth at bedtime as needed for sleep.  Dispense: 90 tablet; Refill: 1  5. MDD (major depressive disorder), recurrent episode, moderate ([name])  - sertraline (ZOLOFT) 100 MG tablet;  Take 1.5 tablets (150 mg total) by mouth daily.  Dispense: 135 tablet; Refill: 0 - buPROPion (WELLBUTRIN XL) 150 MG 24 hr tablet; Take 1 tablet (150 mg total) by mouth daily.  Dispense: 90 tablet; Refill: 0  6. Adult hypothyroidism   7. Benign hypertension  At goal   8. Obstructive sleep apnea   9. Stage 3b chronic kidney disease ([place])  Under the care of nephrologist   10. Morbid obesity with BMI of 40.0-44.9, adult [name])  Discussed with the patient the risk posed by an increased BMI. Discussed importance of portion control, calorie counting and at least 150 minutes of physical activity weekly. Avoid sweet beverages and drink more water. Eat at least 6 servings of fruit and vegetables daily  Going to wellness center  11. Primary osteoarthritis of both knees  Seeing Ortho

## 2020-06-21 ENCOUNTER — Encounter (INDEPENDENT_AMBULATORY_CARE_PROVIDER_SITE_OTHER): Payer: Self-pay | Admitting: Family Medicine

## 2020-06-21 NOTE — Progress Notes (Signed)
Chief Complaint:   OBESITY Summer [name] is here to discuss her progress with her obesity treatment plan along with follow-up of her obesity related diagnoses. Summer [name] is on the Category 2 Plan and states she is following her eating plan approximately 50% of the time. Summer [name] states she is doing physical rehab.   Today's visit was #: 3 Starting weight: 251 lbs Starting date: 05/13/2020 Today's weight: 247 lbs Today's date: 06/17/2020 Total lbs lost to date: 4 Total lbs lost since last in-office visit: 0  Interim History: Summer [name] is deviating from her plan quite a bit. 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 She says she has 3 diets (ours, one from her bariatric surgeon, and one from her primary care physician) and she says this confuses her. She notes eating increased sweets (ice cream).  Subjective:   1. Type 2 diabetes mellitus with renal insuffiency, stage 3b GFR 41, with long-term current use of insulin ([facility]) Summer [name]'s diabetes mellitus is well controlled on 70/30 once daily. She denies hypoglycemia. Her fasting BGs range between 71 and 187, and 2 hour post prandial range between 110 and 153. She is supposed to take insulin BID but she does not take 70/30 if CBGs are <100.  Lab Results  Component Value Date   HGBA1C 6.7 (H) 05/13/2020   HGBA1C 6.7 (A) 03/12/2020   HGBA1C 6.0 (A) 11/03/2019   Lab Results  Component Value Date   MICROALBUR 0.6 01/28/2018   LDLCALC 117 (H) 05/13/2020   CREATININE 1.30 (H) 05/13/2020   Lab Results  Component Value Date   INSULIN 78.8 (H) 05/13/2020   Assessment/Plan:   1. Type 2 diabetes mellitus with renal insuffiency, stage 3b GFR 41, with long-term current use of insulin (Summer [name])  Summer [name] will continue to take insulin as directed.  2. Class 3 severe obesity with serious comorbidity and body mass index (BMI) of 45.0 to 49.9 in adult, unspecified obesity type Summer [name]) Summer [name] is currently in the action stage of change. As such, her  goal is to continue with weight loss efforts. She has agreed to the Category 2 Plan.   Exercise goals: As is. I advised her to sue our plan since she sees [place] more frequently.   Behavioral modification strategies: increasing lean protein intake, decreasing simple carbohydrates, meal planning and cooking strategies and better snacking choices.  Summer [name] has agreed to follow-up with our clinic in 2 weeks.   Objective:   Blood pressure 123/63, pulse 65, temperature 97.8 F (36.6 C), height 5\' 1"  (1.549 m), weight 247 lb (112 kg), SpO2 98 %. Body mass index is 46.67 kg/m.  General: Cooperative, alert, well developed, in no acute distress. HEENT: Conjunctivae and lids unremarkable. Cardiovascular: Regular rhythm.  Lungs: Normal work of breathing. Neurologic: No focal deficits.   Lab Results  Component Value Date   CREATININE 1.30 (H) 05/13/2020   BUN 49 (H) 05/13/2020   NA 142 05/13/2020   K 5.0 05/13/2020   CL 104 05/13/2020   CO2 25 05/13/2020   Lab Results  Component Value Date   ALT 14 05/13/2020   AST 14 05/13/2020   ALKPHOS 85 05/13/2020   BILITOT 0.3 05/13/2020   Lab Results  Component Value Date   HGBA1C 6.7 (H) 05/13/2020   HGBA1C 6.7 (A) 03/12/2020   HGBA1C 6.0 (A) 11/03/2019   HGBA1C 7.2 (H) 04/02/2019   HGBA1C 8.1 (A) 08/27/2018   HGBA1C 8.1 08/27/2018   HGBA1C 8.1 (A) 08/27/2018  HGBA1C 8.1 (A) 08/27/2018   Lab Results  Component Value Date   INSULIN 78.8 (H) 05/13/2020   Lab Results  Component Value Date   TSH 4.630 (H) 05/13/2020   Lab Results  Component Value Date   CHOL 209 (H) 05/13/2020   HDL 38 (L) 05/13/2020   LDLCALC 117 (H) 05/13/2020   TRIG 309 (H) 05/13/2020   CHOLHDL 5.5 (H) 05/13/2020   Lab Results  Component Value Date   WBC 8.0 05/13/2020   HGB 11.1 05/13/2020   HCT 33.7 (L) 05/13/2020   MCV 87 05/13/2020   PLT 153 05/13/2020   No results found for: IRON, TIBC, FERRITIN  Obesity Behavioral Intervention:   Approximately  15 minutes were spent on the discussion below.  ASK: We discussed the diagnosis of obesity with Summer [name] today and Summer [name] agreed to give [place] permission to discuss obesity behavioral modification therapy today.  ASSESS: Summer [name] has the diagnosis of obesity and her BMI today is 46.69. Jaeley is in the action stage of change.   ADVISE: Summer [name] was educated on the multiple health risks of obesity as well as the benefit of weight loss to improve her health. She was advised of the need for long term treatment and the importance of lifestyle modifications to improve her current health and to decrease her risk of future health problems.  AGREE: Multiple dietary modification options and treatment options were discussed and Summer [name] agreed to follow the recommendations documented in the above note.  ARRANGE: Summer [name] was educated on the importance of frequent visits to treat obesity as outlined per CMS and USPSTF guidelines and agreed to schedule her next follow up appointment today.  Attestation Statements:   Reviewed by clinician on day of visit: allergies, medications, problem list, medical history, surgical history, family history, social history, and previous encounter notes.   Wilhemena Durie, am acting as Location manager for [employer], FNP-C.  I have reviewed the above documentation for accuracy and completeness, and I agree with the above. -  Georgianne Fick, FNP

## 2020-06-22 ENCOUNTER — Ambulatory Visit: Payer: Medicare HMO | Admitting: Psychology

## 2020-06-23 ENCOUNTER — Ambulatory Visit: Payer: Medicare HMO | Admitting: Psychology

## 2020-06-23 ENCOUNTER — Ambulatory Visit: Payer: Medicare HMO | Admitting: Orthotics

## 2020-06-23 ENCOUNTER — Other Ambulatory Visit: Payer: Self-pay

## 2020-06-23 DIAGNOSIS — M216X2 Other acquired deformities of left foot: Secondary | ICD-10-CM | POA: Diagnosis not present

## 2020-06-23 DIAGNOSIS — E1142 Type 2 diabetes mellitus with diabetic polyneuropathy: Secondary | ICD-10-CM | POA: Diagnosis not present

## 2020-06-23 DIAGNOSIS — M216X1 Other acquired deformities of right foot: Secondary | ICD-10-CM | POA: Diagnosis not present

## 2020-06-24 ENCOUNTER — Other Ambulatory Visit: Payer: Self-pay

## 2020-06-24 ENCOUNTER — Telehealth (INDEPENDENT_AMBULATORY_CARE_PROVIDER_SITE_OTHER): Payer: Medicare HMO | Admitting: Psychology

## 2020-06-24 ENCOUNTER — Other Ambulatory Visit: Payer: Self-pay | Admitting: Family Medicine

## 2020-06-24 DIAGNOSIS — G4733 Obstructive sleep apnea (adult) (pediatric): Secondary | ICD-10-CM

## 2020-06-24 DIAGNOSIS — F331 Major depressive disorder, recurrent, moderate: Secondary | ICD-10-CM

## 2020-06-24 DIAGNOSIS — F3289 Other specified depressive episodes: Secondary | ICD-10-CM

## 2020-06-24 MED ORDER — MODAFINIL 100 MG PO TABS: 100.0000 mg | ORAL_TABLET | Freq: Every day | ORAL | 0 refills | Status: DC

## 2020-06-24 NOTE — Progress Notes (Signed)
  Office: [phone]  /  Fax: [phone]    Date: July 08, 2020   Appointment Start Time: 3:02pm Duration: 36 minutes Provider: Glennie Isle, Psy.D. Type of Session: Individual Therapy  Location of Patient: Home Location of Provider: Provider's Home Type of Contact: Telepsychological Visit via MyChart Video Visit  Session Content: Summer [name] is a 69 y.o. female presenting for a follow-up appointment to address the previously established treatment goal of increasing coping skills. Today's appointment was a telepsychological visit due to COVID-19. Binta provided verbal consent for today's telepsychological appointment and she is aware she is responsible for securing confidentiality on her end of the session. Prior to proceeding with today's appointment, Ailah's physical location at the time of this appointment was obtained as well a phone number she could be reached at in the event of technical difficulties. Gelila and this provider participated in today's telepsychological service.   Of note, today's appointment was switched to a regular telephone call at 3:03pm with Beya's verbal consent due to technical issues.   This provider conducted a brief check-in. Amellia shared she was sick recently, but noted she feels "pretty good" today. She stated she recently learned her boyfriend has a "mass on his spine." The increased stress has resulted in an increase in emotional eating. Psychoeducation regarding the importance of self-care utilizing the oxygen mask metaphor was provided.  Additionally, psychoeducation regarding pleasurable activities, including its impact on emotional eating and overall well-being was provided. Iasia was provided with a handout with various options of pleasurable activities, and was encouraged to engage in one activity a day and additional activities as needed when triggered to emotionally eat. Blanch Media agreed. Mckynna provided verbal consent during today's appointment for this  provider to send a handout with pleasurable activities via e-mail. Zykeriah was receptive to today's appointment as evidenced by openness to sharing, responsiveness to feedback, and willingness to engage in pleasurable activities to assist with coping.  Mental Status Examination:  Appearance: well groomed and appropriate hygiene  Behavior: appropriate to circumstances Mood: euthymic Affect: mood congruent Speech: normal in rate, volume, and tone Eye Contact: appropriate Psychomotor Activity: appropriate Gait: unable to assess Thought Process: linear, logical, and goal directed  Thought Content/Perception: denies suicidal and homicidal ideation, plan, and intent, no hallucinations, delusions, bizarre thinking or behavior reported or observed and denies ideation and engagement in self-injurious behaviors Orientation: time, person, place, and purpose of appointment Memory/Concentration: memory, attention, language, and fund of knowledge intact  Insight/Judgment: fair  Interventions:  Conducted a brief chart review Provided empathic reflections and validation Employed supportive psychotherapy interventions to facilitate reduced distress and to improve coping skills with identified stressors Psychoeducation provided regarding pleasurable activities Psychoeducation provided regarding self-care  DSM-5 Diagnosis(es): 311 (F32.8) Other Specified Depressive Disorder, Emotional Eating Behaviors  Treatment Goal & Progress: During the initial appointment with this provider, the following treatment goal was established: increase coping skills. Mikiyah has demonstrated progress in her goal as evidenced by increased awareness of hunger patterns. Debora also demonstrates willingness to engage in pleasurable activities.  Plan: Based on recent stressors, the next appointment will be scheduled in one week, which will be via MyChart Video Visit. The next session will focus on working towards the established  treatment goal.

## 2020-06-25 ENCOUNTER — Encounter: Payer: Self-pay | Admitting: Family Medicine

## 2020-06-25 ENCOUNTER — Other Ambulatory Visit: Payer: Self-pay | Admitting: Family Medicine

## 2020-06-25 DIAGNOSIS — G4733 Obstructive sleep apnea (adult) (pediatric): Secondary | ICD-10-CM

## 2020-06-25 DIAGNOSIS — F331 Major depressive disorder, recurrent, moderate: Secondary | ICD-10-CM

## 2020-06-29 ENCOUNTER — Other Ambulatory Visit: Payer: Self-pay

## 2020-06-29 ENCOUNTER — Telehealth: Payer: Self-pay

## 2020-06-29 MED ORDER — ALBUTEROL SULFATE (2.5 MG/3ML) 0.083% IN NEBU
2.5000 mg | INHALATION_SOLUTION | Freq: Four times a day (QID) | RESPIRATORY_TRACT | 2 refills | Status: DC | PRN
Start: 2020-06-29 — End: 2021-01-28

## 2020-06-29 NOTE — Telephone Encounter (Signed)
Copied from [place] [id number]. Topic: General - Inquiry >> Jun 29, 2020  3:46 PM Alease Frame wrote: Reason for CRM: Pt returned call from nurse at office . Pt would like provigil sent to [facility] . Please advise

## 2020-07-01 ENCOUNTER — Ambulatory Visit (INDEPENDENT_AMBULATORY_CARE_PROVIDER_SITE_OTHER): Payer: Medicare HMO | Admitting: Family Medicine

## 2020-07-01 ENCOUNTER — Other Ambulatory Visit: Payer: Self-pay

## 2020-07-02 ENCOUNTER — Telehealth: Payer: Self-pay | Admitting: *Deleted

## 2020-07-02 MED ORDER — MODAFINIL 100 MG PO TABS: 100.0000 mg | ORAL_TABLET | Freq: Every day | ORAL | 0 refills | Status: DC

## 2020-07-02 NOTE — Chronic Care Management (AMB) (Signed)
  Chronic Care Management   Note  07/02/2020 Name: Summer [name] MRN: [id number] DOB: Dec 27, 1950  Summer [name] is a 69 y.o. year old female who is a primary care patient of Steele Sizer, MD. I reached out to [name] by phone today in response to a referral sent by [name]'s health plan.     [name] was given information about Chronic Care Management services today including:  1. CCM service includes personalized support from designated clinical staff supervised by her physician, including individualized plan of care and coordination with other care providers 2. 24/7 contact phone numbers for assistance for urgent and routine care needs. 3. Service will only be billed when office clinical staff spend 20 minutes or more in a month to coordinate care. 4. Only one practitioner may furnish and bill the service in a calendar month. 5. The patient may stop CCM services at any time (effective at the end of the month) by phone call to the office staff. 6. The patient will be responsible for cost sharing (co-pay) of up to 20% of the service fee (after annual deductible is met).  Patient agreed to services and verbal consent obtained.   Follow up plan: Telephone appointment with care management team member scheduled for:07/07/2020  [facility]  Direct Dial: [phone]

## 2020-07-05 ENCOUNTER — Telehealth: Payer: Self-pay | Admitting: Family Medicine

## 2020-07-05 NOTE — Telephone Encounter (Addendum)
Pt pick up the nebulizer solution from [facility]. Pt needs the nebulizer from [facility] in [place].

## 2020-07-06 ENCOUNTER — Other Ambulatory Visit: Payer: Self-pay

## 2020-07-06 DIAGNOSIS — J4520 Mild intermittent asthma, uncomplicated: Secondary | ICD-10-CM

## 2020-07-06 MED ORDER — NEBULIZER SYSTEM ALL-IN-ONE MISC: 1.0000 | Freq: Once | 0 refills | Status: AC

## 2020-07-07 ENCOUNTER — Telehealth: Payer: Medicare HMO

## 2020-07-07 ENCOUNTER — Telehealth: Payer: Self-pay

## 2020-07-07 ENCOUNTER — Ambulatory Visit: Payer: Medicare HMO | Admitting: Psychology

## 2020-07-07 DIAGNOSIS — H35342 Macular cyst, hole, or pseudohole, left eye: Secondary | ICD-10-CM | POA: Diagnosis not present

## 2020-07-07 LAB — HM DIABETES EYE EXAM

## 2020-07-07 NOTE — Telephone Encounter (Signed)
  Chronic Care Management   Outreach Note  07/07/2020 Name: Summer [name] MRN: [id number] DOB: 1951-08-29  Primary Care Provider: Steele Sizer, MD Reason for referral : Chronic Care Management   An unsuccessful telephone outreach was attempted today. [name] was referred to the case management team for assistance with care management and care coordination.   Phone rang multiple times today without option to leave a voice message.    PLAN A member of the care management team will attempt to reach [name] again within the next two weeks.    [name] [phone]

## 2020-07-08 ENCOUNTER — Telehealth (INDEPENDENT_AMBULATORY_CARE_PROVIDER_SITE_OTHER): Payer: Medicare HMO | Admitting: Psychology

## 2020-07-08 DIAGNOSIS — F3289 Other specified depressive episodes: Secondary | ICD-10-CM | POA: Diagnosis not present

## 2020-07-08 NOTE — Progress Notes (Unsigned)
Office: [phone]  /  Fax: [phone]    Date: July 15, 2020   Appointment Start Time: *** Duration: *** minutes Provider: Glennie Isle, Psy.D. Type of Session: Individual Therapy  Location of Patient: {gbptloc:23249} Location of Provider: Provider's Home Type of Contact: Telepsychological Visit via MyChart Video Visit  Session Content: Summer [name] is a 69 y.o. female presenting for a follow-up appointment to address the previously established treatment goal of increasing coping skills. Today's appointment was a telepsychological visit due to COVID-19. Summer [name] provided verbal consent for today's telepsychological appointment and she is aware she is responsible for securing confidentiality on her end of the session. Prior to proceeding with today's appointment, Summer [name]'s physical location at the time of this appointment was obtained as well a phone number she could be reached at in the event of technical difficulties. Summer [name] and this provider participated in today's telepsychological service.   This provider conducted a brief check-in and verbally administered the PHQ-9 and GAD-7. *** Summer [name] was receptive to today's appointment as evidenced by openness to sharing, responsiveness to feedback, and {gbreceptiveness:23401}.  Mental Status Examination:  Appearance: {Appearance:22431} Behavior: {Behavior:22445} Mood: {gbmood:21757} Affect: {Affect:22436} Speech: {Speech:22432} Eye Contact: {Eye Contact:22433} Psychomotor Activity: {Motor Activity:22434} Gait: {gbgait:23404} Thought Process: {thought process:22448}  Thought Content/Perception: {disturbances:22451} Orientation: {Orientation:22437} Memory/Concentration: {gbcognition:22449} Insight/Judgment: {Insight:22446}  Structured Assessments Results: The Patient Health Questionnaire-9 (PHQ-9) is a self-report measure that assesses symptoms and severity of depression over the course of the last two weeks. Summer [name] obtained a score of ***  suggesting {GBPHQ9SEVERITY:21752}. Summer [name] finds the endorsed symptoms to be {gbphq9difficulty:21754}. [0= Not at all; 1= Several days; 2= More than half the days; 3= Nearly every day] Summer [name] interest or pleasure in doing things ***  Feeling down, depressed, or hopeless ***  Trouble falling or staying asleep, or sleeping too much ***  Feeling tired or having Summer [name] energy ***  Poor appetite or overeating ***  Feeling bad about yourself --- or that you are a failure or have let yourself or your family down ***  Trouble concentrating on things, such as reading the newspaper or watching television ***  Moving or speaking so slowly that other people could have noticed? Or the opposite --- being so fidgety or restless that you have been moving around a lot more than usual ***  Thoughts that you would be better off dead or hurting yourself in some way ***  PHQ-9 Score ***    The Generalized Anxiety Disorder-7 (GAD-7) is a brief self-report measure that assesses symptoms of anxiety over the course of the last two weeks. Summer [name] obtained a score of *** suggesting {gbgad7severity:21753}. Summer [name] finds the endorsed symptoms to be {gbphq9difficulty:21754}. 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 Summer [name] has demonstrated progress in her goal as evidenced by  {gbtxprogress:22839}. Summer [name] also {gbtxprogress2:22951}.  Plan: The next appointment will be scheduled in {gbweeks:21758}, which will be {  gbtxmodality:23402}. The next session will focus on {Plan for Next Appointment:23400}.

## 2020-07-12 ENCOUNTER — Encounter (INDEPENDENT_AMBULATORY_CARE_PROVIDER_SITE_OTHER): Payer: Self-pay | Admitting: Family Medicine

## 2020-07-12 ENCOUNTER — Encounter: Payer: Self-pay | Admitting: Family Medicine

## 2020-07-12 NOTE — Telephone Encounter (Signed)
Last seen by Dr Beasley 

## 2020-07-13 ENCOUNTER — Encounter (INDEPENDENT_AMBULATORY_CARE_PROVIDER_SITE_OTHER): Payer: Self-pay | Admitting: Family Medicine

## 2020-07-13 ENCOUNTER — Ambulatory Visit (INDEPENDENT_AMBULATORY_CARE_PROVIDER_SITE_OTHER): Payer: Medicare HMO | Admitting: Family Medicine

## 2020-07-13 ENCOUNTER — Other Ambulatory Visit: Payer: Self-pay

## 2020-07-13 VITALS — BP 122/57 | HR 68 | Temp 97.6°F | Ht 61.0 in | Wt 242.0 lb

## 2020-07-13 DIAGNOSIS — E1169 Type 2 diabetes mellitus with other specified complication: Secondary | ICD-10-CM | POA: Diagnosis not present

## 2020-07-13 DIAGNOSIS — M47816 Spondylosis without myelopathy or radiculopathy, lumbar region: Secondary | ICD-10-CM | POA: Diagnosis not present

## 2020-07-13 DIAGNOSIS — M4726 Other spondylosis with radiculopathy, lumbar region: Secondary | ICD-10-CM | POA: Diagnosis not present

## 2020-07-13 DIAGNOSIS — M48062 Spinal stenosis, lumbar region with neurogenic claudication: Secondary | ICD-10-CM | POA: Diagnosis not present

## 2020-07-13 DIAGNOSIS — Z794 Long term (current) use of insulin: Secondary | ICD-10-CM

## 2020-07-13 DIAGNOSIS — Z6841 Body Mass Index (BMI) 40.0 and over, adult: Secondary | ICD-10-CM

## 2020-07-13 DIAGNOSIS — M5416 Radiculopathy, lumbar region: Secondary | ICD-10-CM | POA: Diagnosis not present

## 2020-07-13 DIAGNOSIS — M5136 Other intervertebral disc degeneration, lumbar region: Secondary | ICD-10-CM | POA: Diagnosis not present

## 2020-07-13 NOTE — Progress Notes (Signed)
dia

## 2020-07-14 NOTE — Progress Notes (Signed)
Chief Complaint:   OBESITY Summer [name] is here to discuss her progress with her obesity treatment plan along with follow-up of her obesity related diagnoses. Summer [name] is on the Category 2 Plan and states she is following her eating plan approximately 50% of the time. Shandon states she is doing 0 minutes 0 times per week.  Today's visit was #: 4 Starting weight: 251 lbs Starting date: 05/13/2020 Today's weight: 242 lbs Today's date: 07/13/2020 Total lbs lost to date: 9 Total lbs lost since last in-office visit: 5  Interim History: Summer [name] continues to do well with weight loss, but she is deviating from the plan more especially at dinner. She isn't getting in all her protein at times but seems to be doing well with portion control.  Subjective:   1. Type 2 diabetes mellitus with other specified complication, with long-term current use of insulin ([name]) Kenedi states she is checking her BGs 1-2 times per week, but mostly is adjusting her insulin based on how she feels. She is followed by Endocrinology.  Assessment/Plan:   1. Type 2 diabetes mellitus with other specified complication, with long-term current use of insulin ([name]) Good blood sugar control is important to decrease the likelihood of diabetic complications such as nephropathy, neuropathy, limb loss, blindness, coronary artery disease, and death. Intensive lifestyle modification including diet, exercise and weight loss are the first line of treatment for diabetes. Summer [name] was asked to check her BGs at least 1 time per day at various times, and keep a log for me to see how her weight loss could be affecting her. She was warned of the danger of low BGs, especially while losing weight. She agreed to do so.  2. Class 3 severe obesity with serious comorbidity and body mass index (BMI) of 45.0 to 49.9 in adult, unspecified obesity type [facility]) Summer [name] is currently in the action stage of change. As such, her goal is to continue with weight loss efforts.  She has agreed to the Category 2 Plan.   Behavioral modification strategies: increasing lean protein intake, no skipping meals and dealing with family or coworker sabotage.  Summer [name] has agreed to follow-up with our clinic in 2 to 3 weeks. She was informed of the importance of frequent follow-up visits to maximize her success with intensive lifestyle modifications for her multiple health conditions.   Objective:   Blood pressure (!) 122/57, pulse 68, temperature 97.6 F (36.4 C), height 5\' 1"  (1.549 m), weight 242 lb (109.8 kg), SpO2 97 %. Body mass index is 45.73 kg/m.  General: Cooperative, alert, well developed, in no acute distress. HEENT: Conjunctivae and lids unremarkable. Cardiovascular: Regular rhythm.  Lungs: Normal work of breathing. Neurologic: No focal deficits.   Lab Results  Component Value Date   CREATININE 1.30 (H) 05/13/2020   BUN 49 (H) 05/13/2020   NA 142 05/13/2020   K 5.0 05/13/2020   CL 104 05/13/2020   CO2 25 05/13/2020   Lab Results  Component Value Date   ALT 14 05/13/2020   AST 14 05/13/2020   ALKPHOS 85 05/13/2020   BILITOT 0.3 05/13/2020   Lab Results  Component Value Date   HGBA1C 6.7 (H) 05/13/2020   HGBA1C 6.7 (A) 03/12/2020   HGBA1C 6.0 (A) 11/03/2019   HGBA1C 7.2 (H) 04/02/2019   HGBA1C 8.1 (A) 08/27/2018   HGBA1C 8.1 08/27/2018   HGBA1C 8.1 (A) 08/27/2018   HGBA1C 8.1 (A) 08/27/2018   Lab Results  Component Value Date   INSULIN 78.8 (H)  05/13/2020   Lab Results  Component Value Date   TSH 4.630 (H) 05/13/2020   Lab Results  Component Value Date   CHOL 209 (H) 05/13/2020   HDL 38 (L) 05/13/2020   LDLCALC 117 (H) 05/13/2020   TRIG 309 (H) 05/13/2020   CHOLHDL 5.5 (H) 05/13/2020   Lab Results  Component Value Date   WBC 8.0 05/13/2020   HGB 11.1 05/13/2020   HCT 33.7 (L) 05/13/2020   MCV 87 05/13/2020   PLT 153 05/13/2020   No results found for: IRON, TIBC, FERRITIN  Attestation Statements:   Reviewed by clinician  on day of visit: allergies, medications, problem list, medical history, surgical history, family history, social history, and previous encounter notes.  Time spent on visit including pre-visit chart review and post-visit care and charting was 30 minutes.    I, Trixie Dredge, am acting as transcriptionist for Dennard Nip, MD.  I have reviewed the above documentation for accuracy and completeness, and I agree with the above. -  Dennard Nip, MD

## 2020-07-15 ENCOUNTER — Telehealth (INDEPENDENT_AMBULATORY_CARE_PROVIDER_SITE_OTHER): Payer: Medicare HMO | Admitting: Psychology

## 2020-07-20 ENCOUNTER — Ambulatory Visit (INDEPENDENT_AMBULATORY_CARE_PROVIDER_SITE_OTHER): Payer: Medicare HMO | Admitting: Psychology

## 2020-07-20 DIAGNOSIS — F411 Generalized anxiety disorder: Secondary | ICD-10-CM

## 2020-07-20 DIAGNOSIS — F331 Major depressive disorder, recurrent, moderate: Secondary | ICD-10-CM | POA: Diagnosis not present

## 2020-07-26 ENCOUNTER — Ambulatory Visit: Payer: Medicare HMO

## 2020-07-27 ENCOUNTER — Ambulatory Visit: Payer: Medicare HMO | Admitting: Psychology

## 2020-07-27 ENCOUNTER — Ambulatory Visit (INDEPENDENT_AMBULATORY_CARE_PROVIDER_SITE_OTHER): Payer: Medicare HMO | Admitting: Family Medicine

## 2020-07-27 ENCOUNTER — Encounter (INDEPENDENT_AMBULATORY_CARE_PROVIDER_SITE_OTHER): Payer: Self-pay | Admitting: Family Medicine

## 2020-07-27 ENCOUNTER — Other Ambulatory Visit: Payer: Self-pay

## 2020-07-27 VITALS — BP 121/54 | HR 69 | Temp 97.7°F | Ht 61.0 in | Wt 242.0 lb

## 2020-07-27 DIAGNOSIS — I10 Essential (primary) hypertension: Secondary | ICD-10-CM

## 2020-07-27 DIAGNOSIS — Z6841 Body Mass Index (BMI) 40.0 and over, adult: Secondary | ICD-10-CM

## 2020-07-27 DIAGNOSIS — Z794 Long term (current) use of insulin: Secondary | ICD-10-CM | POA: Diagnosis not present

## 2020-07-27 DIAGNOSIS — E1169 Type 2 diabetes mellitus with other specified complication: Secondary | ICD-10-CM

## 2020-07-27 MED ORDER — OZEMPIC (0.25 OR 0.5 MG/DOSE) 2 MG/1.5ML ~~LOC~~ SOPN: 0.2500 mg | PEN_INJECTOR | SUBCUTANEOUS | 0 refills | Status: DC

## 2020-07-28 NOTE — Progress Notes (Signed)
Chief Complaint:   OBESITY Summer [name] is here to discuss her progress with her obesity treatment plan along with follow-up of her obesity related diagnoses. Summer [name] is on the Category 2 Plan and states she is following her eating plan approximately 80% of the time. Chaneka states she is doing 0 minutes 0 times per week.  Today's visit was #: 5 Starting weight: 251 lbs Starting date: 05/13/2020 Today's weight: 242 lbs Today's date: 07/27/2020 Total lbs lost to date: 9 Total lbs lost since last in-office visit: 0  Interim History: Summer [name] continues to struggle with weight loss. She is trying to have weight loss surgery, but she has to start over with pre-op visits, evaluations, etc. She is open to discussing Thanksgiving strategies.  Subjective:   1. Type 2 diabetes mellitus with other specified complication, with long-term current use of insulin ([name]) Summer [name] is on 70/30 100 units 1-2 times daily, which she self titrate. This is leading to significant glucose excursions from 90 to 280, and makes meal planning especially difficult. She notes polyphagia makes weight loss almost impossible..   2. Essential hypertension Summer [name]'s blood pressure is controlled on her medications. She is working on weight loss and she denies chest pain or lightheadedness.  Assessment/Plan:   1. Type 2 diabetes mellitus with other specified complication, with long-term current use of insulin ([name]) Good blood sugar control is important to decrease the likelihood of diabetic complications such as nephropathy, neuropathy, limb loss, blindness, coronary artery disease, and death. Intensive lifestyle modification including diet, exercise and weight loss are the first line of treatment for diabetes. Cinda agreed to start Ozempic at 0.25 mg SubQ weekly with no refills. She is to continue monitoring her glucose levels closely, and will follow up in 2 weeks.  - Semaglutide,0.25 or 0.5MG /DOS, (OZEMPIC, 0.25 OR 0.5 MG/DOSE,) 2  MG/1.5ML SOPN; Inject 0.25 mg into the skin once a week.  Dispense: 1.5 mL; Refill: 0  2. Essential hypertension Kare will continue Norvasc and Lasix, and she will continue working on healthy weight loss and exercise to improve blood pressure control. We will watch for signs of hypotension as she continues her lifestyle modifications.  3. Class 3 severe obesity with serious comorbidity and body mass index (BMI) of 45.0 to 49.9 in adult, unspecified obesity type [facility]) Summer [name] is currently in the action stage of change. As such, her goal is to continue with weight loss efforts. She has agreed to the Category 2 Plan.   Behavioral modification strategies: no skipping meals, meal planning and cooking strategies and holiday eating strategies .  Summer [name] has agreed to follow-up with our clinic in 3 weeks. She was informed of the importance of frequent follow-up visits to maximize her success with intensive lifestyle modifications for her multiple health conditions.   Objective:   Blood pressure (!) 121/54, pulse 69, temperature 97.7 F (36.5 C), height 5\' 1"  (1.549 m), weight 242 lb (109.8 kg), SpO2 96 %. Body mass index is 45.73 kg/m.  General: Cooperative, alert, well developed, in no acute distress. HEENT: Conjunctivae and lids unremarkable. Cardiovascular: Regular rhythm.  Lungs: Normal work of breathing. Neurologic: No focal deficits.   Lab Results  Component Value Date   CREATININE 1.30 (H) 05/13/2020   BUN 49 (H) 05/13/2020   NA 142 05/13/2020   K 5.0 05/13/2020   CL 104 05/13/2020   CO2 25 05/13/2020   Lab Results  Component Value Date   ALT 14 05/13/2020   AST 14 05/13/2020  ALKPHOS 85 05/13/2020   BILITOT 0.3 05/13/2020   Lab Results  Component Value Date   HGBA1C 6.7 (H) 05/13/2020   HGBA1C 6.7 (A) 03/12/2020   HGBA1C 6.0 (A) 11/03/2019   HGBA1C 7.2 (H) 04/02/2019   HGBA1C 8.1 (A) 08/27/2018   HGBA1C 8.1 08/27/2018   HGBA1C 8.1 (A) 08/27/2018   HGBA1C 8.1 (A)  08/27/2018   Lab Results  Component Value Date   INSULIN 78.8 (H) 05/13/2020   Lab Results  Component Value Date   TSH 4.630 (H) 05/13/2020   Lab Results  Component Value Date   CHOL 209 (H) 05/13/2020   HDL 38 (L) 05/13/2020   LDLCALC 117 (H) 05/13/2020   TRIG 309 (H) 05/13/2020   CHOLHDL 5.5 (H) 05/13/2020   Lab Results  Component Value Date   WBC 8.0 05/13/2020   HGB 11.1 05/13/2020   HCT 33.7 (L) 05/13/2020   MCV 87 05/13/2020   PLT 153 05/13/2020   No results found for: IRON, TIBC, FERRITIN  Obesity Behavioral Intervention:   Approximately 15 minutes were spent on the discussion below.  ASK: We discussed the diagnosis of obesity with Blanch Media today and Illana agreed to give [place] permission to discuss obesity behavioral modification therapy today.  ASSESS: Summer [name] has the diagnosis of obesity and her BMI today is 45.75. Summer [name] is in the action stage of change.   ADVISE: Summer [name] was educated on the multiple health risks of obesity as well as the benefit of weight loss to improve her health. She was advised of the need for long term treatment and the importance of lifestyle modifications to improve her current health and to decrease her risk of future health problems.  AGREE: Multiple dietary modification options and treatment options were discussed and Summer [name] agreed to follow the recommendations documented in the above note.  ARRANGE: Summer [name] was educated on the importance of frequent visits to treat obesity as outlined per CMS and USPSTF guidelines and agreed to schedule her next follow up appointment today.  Attestation Statements:   Reviewed by clinician on day of visit: allergies, medications, problem list, medical history, surgical history, family history, social history, and previous encounter notes.   I, Trixie Dredge, am acting as transcriptionist for Summer Nip, MD.  I have reviewed the above documentation for accuracy and completeness, and I agree with the  above. -  Summer Nip, MD

## 2020-07-29 ENCOUNTER — Other Ambulatory Visit: Payer: Self-pay | Admitting: Family Medicine

## 2020-07-29 ENCOUNTER — Telehealth (INDEPENDENT_AMBULATORY_CARE_PROVIDER_SITE_OTHER): Payer: Self-pay

## 2020-07-29 DIAGNOSIS — K219 Gastro-esophageal reflux disease without esophagitis: Secondary | ICD-10-CM

## 2020-07-29 NOTE — Telephone Encounter (Signed)
Pt called stating that her Ozempic will be $90.00 a month and wanted to see if there is an alternative that Dr.Beasley could give her, please give pt a call.

## 2020-07-29 NOTE — Telephone Encounter (Signed)
Pt sent a mychart yesterday with this information, but it was attached to a previous message thread regarding Vitamin D. Pt was responded to, however, she never read the message. Today, I contact pt back after receiving this medication, and pt clarified that it was regarding Ozempic and not Vitamin D. Pt reports that she may be in the donut hole, but she isnt sure. Pt was instructed to contact her insurance and find out if she is in fact, in the donut hole, and then also find out if there is another alternative medication within the same drug class that her insurance prefers at a more affordable price to her besides ozempic, if she is not in the donut hole.  Pt is going to do this, and she will contact this office back once she has found the appropriate answer.

## 2020-07-29 NOTE — Telephone Encounter (Signed)
Dr Beasley pt 

## 2020-08-03 ENCOUNTER — Ambulatory Visit: Payer: Medicare HMO

## 2020-08-03 ENCOUNTER — Ambulatory Visit: Payer: Medicare HMO | Admitting: Psychology

## 2020-08-12 ENCOUNTER — Ambulatory Visit (INDEPENDENT_AMBULATORY_CARE_PROVIDER_SITE_OTHER): Payer: Medicare HMO | Admitting: Family Medicine

## 2020-08-13 ENCOUNTER — Telehealth: Payer: Self-pay

## 2020-08-13 NOTE — Telephone Encounter (Signed)
  Chronic Care Management   Outreach Note  08/13/2020 Name: SHAKELA DONATI MRN: [id number] DOB: February 13, 1951  Primary Care Provider: Steele Sizer, MD Reason for referral : Chronic Care Management   An unsuccessful telephone outreach was attempted today. Ms. Oravec was referred to the case management team for assistance with care management and care coordination.     Follow Up Plan:  A HIPAA compliant voice message was left today requesting a return call.    [name] [phone]

## 2020-08-17 ENCOUNTER — Encounter: Payer: Self-pay | Admitting: Family

## 2020-08-17 ENCOUNTER — Ambulatory Visit: Payer: Medicare HMO | Admitting: Podiatry

## 2020-08-17 ENCOUNTER — Encounter: Payer: Self-pay | Admitting: Internal Medicine

## 2020-08-17 ENCOUNTER — Telehealth (INDEPENDENT_AMBULATORY_CARE_PROVIDER_SITE_OTHER): Payer: Medicare HMO | Admitting: Internal Medicine

## 2020-08-17 ENCOUNTER — Ambulatory Visit: Payer: Self-pay | Admitting: Family Medicine

## 2020-08-17 ENCOUNTER — Ambulatory Visit: Payer: Medicare HMO | Admitting: Psychology

## 2020-08-17 ENCOUNTER — Ambulatory Visit (INDEPENDENT_AMBULATORY_CARE_PROVIDER_SITE_OTHER): Payer: Medicare HMO | Admitting: Family Medicine

## 2020-08-17 DIAGNOSIS — E1169 Type 2 diabetes mellitus with other specified complication: Secondary | ICD-10-CM | POA: Diagnosis not present

## 2020-08-17 DIAGNOSIS — R059 Cough, unspecified: Secondary | ICD-10-CM

## 2020-08-17 DIAGNOSIS — E785 Hyperlipidemia, unspecified: Secondary | ICD-10-CM

## 2020-08-17 DIAGNOSIS — J069 Acute upper respiratory infection, unspecified: Secondary | ICD-10-CM

## 2020-08-17 DIAGNOSIS — Z1152 Encounter for screening for COVID-19: Secondary | ICD-10-CM | POA: Diagnosis not present

## 2020-08-17 DIAGNOSIS — Z6841 Body Mass Index (BMI) 40.0 and over, adult: Secondary | ICD-10-CM

## 2020-08-17 DIAGNOSIS — N1832 Chronic kidney disease, stage 3b: Secondary | ICD-10-CM | POA: Diagnosis not present

## 2020-08-17 DIAGNOSIS — J4520 Mild intermittent asthma, uncomplicated: Secondary | ICD-10-CM | POA: Diagnosis not present

## 2020-08-17 DIAGNOSIS — R0600 Dyspnea, unspecified: Secondary | ICD-10-CM | POA: Diagnosis not present

## 2020-08-17 NOTE — Telephone Encounter (Signed)
Phone call from pt.  Stated that she has had cough, fever, shortness of breath and nasal congestion x 1 week.   Coughing up clear mucus.  Reported fever broke last Friday.  Stated she is not getting any better with  Cough and nasal congestion.  Has been taking Alka Selzer Plus.  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 agreed with plan.      Reason for Disposition . MILD difficulty breathing (e.g., minimal/no SOB at rest, SOB with walking, pulse <100)  Answer Assessment - Initial Assessment Questions 1. COVID-19 DIAGNOSIS: "Who made your Coronavirus (COVID-19) diagnosis?" "Was it confirmed by a positive lab test?" If not diagnosed by a HCP, ask "Are there lots of cases (community spread) where you live?" (See public health department website, if unsure)     Has not been tested 2. COVID-19 EXPOSURE: "Was there any known exposure to COVID before the symptoms began?" CDC Definition of close contact: within 6 feet (2 meters) for a total of 15 minutes or more over a 24-hour period.      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 ONSET: "When did the COVID-19 symptoms start?"      One week ago 4. WORST SYMPTOM: "What is your worst symptom?" (e.g., cough, fever, shortness of breath, muscle aches)     cough 5. COUGH: "Do you have a cough?" If Yes, ask: "How bad is the cough?"       Yes; clear mucus 6. FEVER: "Do you have a fever?" If Yes, ask: "What is your temperature, how was it measured, and when did it start?"     Thinks it broke on Friday.  7. RESPIRATORY STATUS: "Describe your breathing?" (e.g., shortness of breath, wheezing, unable to speak)      C/o shortness of breath with activity; denied wheezing 8. BETTER-SAME-WORSE: "Are you getting better, staying the same or getting worse compared to  yesterday?"  If getting worse, ask, "In what way?"     Feels about the same; has not gotten better 9. HIGH RISK DISEASE: "Do you have any chronic medical problems?" (e.g., asthma, heart or lung disease, weak immune system, obesity, etc.)     Denied lung problems; reported overweight, high blood pressure, high cholesterol  10. PREGNANCY: "Is there any chance you are pregnant?" "When was your last menstrual period?"       n/a 11. OTHER SYMPTOMS: "Do you have any other symptoms?"  (e.g., chills, fatigue, headache, loss of smell or taste, muscle pain, sore throat; new loss of smell or taste especially support the diagnosis of COVID-19)       Loss of taste, along with runny nose, cough, shortness of breath.  Had fever last on Friday.  Protocols used: CORONAVIRUS (COVID-19) DIAGNOSED OR SUSPECTED-A-AH

## 2020-08-17 NOTE — Progress Notes (Signed)
Name: Summer [name]   MRN: [id number]    DOB: Dec 02, 1950   Date:08/17/2020       Progress Note  Subjective  Chief Complaint  Chief Complaint  Patient presents with  . Cough  . Fever  . Nasal Congestion  . Shortness of Breath  . loss of taste    I connected with  [name] on 08/17/20 at  9:40 AM EST by telephone and verified that I am speaking with the correct person using two identifiers.  I discussed the limitations, risks, security and privacy concerns of performing an evaluation and management service by telephone and the availability of in person appointments. The patient expressed understanding and agreed to proceed. Staff also discussed with the patient that there may be a patient responsible charge related to this service. Patient Location: Home Provider Location: [facility] Additional Individuals present: none  HPI  Patient is a 69 year old female patient of Dr. Ancil Boozer A message was left from the triage nurse this morning as follows:  Phone call from pt.  Stated that she has had cough, fever, shortness of breath and nasal congestion x 1 week.   Coughing up clear mucus.  Reported fever broke last Friday.  Stated she is not getting any better with  Cough and nasal congestion.  Has been taking Alka Selzer Plus.  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 agreed with plan. Follow-up presently by telephone  Covid vaccination status - not have Symptoms started a week ago + cough, some production and clear No marked SOB, not at rest, only with exertion + fever - had and then thermometer broke, not feeling feverish in past 3-4 days No sore throat.  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  Not to work presently.   Patient Active Problem List   Diagnosis Date Noted  . Other fatigue 05/24/2020  . Diabetes mellitus ([place]) 05/24/2020  . B12 deficiency 05/24/2020  . Vitamin D deficiency 05/24/2020  . Metatarsalgia of left foot 01/15/2020  . Preop cardiovascular exam 07/18/2019  . Major depressive disorder, recurrent episode, moderate ([place]) 04/04/2019  . Left arm weakness 04/03/2019  . (HFpEF) heart failure with preserved ejection fraction ([place]) 08/31/2017  . Moderate persistent asthma 08/31/2017  . Hyperlipidemia associated with type 2 diabetes mellitus ([place]) 06/14/2017  . Dyslipidemia associated with type 2 diabetes mellitus ([place]) 08/18/2016  . Charcot foot due to diabetes mellitus ([place]) 02/22/2016  . Acquired abduction deformity of foot 07/12/2015  . Osteoarthritis of subtalar joint 07/12/2015  . Poorly controlled type 2 diabetes mellitus with neuropathy ([place]) 07/12/2015  . Arthritis of foot, degenerative 07/12/2015  . Carpal tunnel syndrome 04/17/2015  . Chronic constipation 04/17/2015  . Insomnia, persistent 04/17/2015  . Stage 3 chronic kidney disease ([place]) 04/17/2015  . Decreased exercise tolerance 04/17/2015  . Diabetes mellitus with  polyneuropathy ([place]) 04/17/2015  . Gastro-esophageal reflux disease without esophagitis 04/17/2015  . Bursitis, trochanteric 04/17/2015  . Cephalalgia 04/17/2015  . Hypertension associated with diabetes ([place]) 04/17/2015  . Adult hypothyroidism 04/17/2015  . Hearing loss 04/17/2015  . Chronic recurrent major depressive disorder ([place]) 04/17/2015  . Neurogenic claudication ([place]) 04/17/2015  . Class 3 severe obesity with serious  comorbidity and body mass index (BMI) of 45.0 to 49.9 in adult ([place]) 04/17/2015  . Hypo-ovarianism 04/17/2015  . Perennial allergic rhinitis with seasonal variation 04/17/2015  . Acne erythematosa 04/17/2015  . Dyskinesia, tardive 04/17/2015  . Memory loss 04/17/2015  . Impingement syndrome of shoulder 04/17/2015  . Dermatitis, stasis 04/17/2015  . Obstructive sleep apnea 05/14/2014  . Shortness of breath on exertion 05/06/2014  . Mixed hyperlipidemia 02/06/2012  . LBP (low back pain) 09/16/2008    Past Surgical History:  Procedure Laterality Date  . ABDOMINAL HYSTERECTOMY  1975  . ANKLE SURGERY Left approx Jan 2018  . APPENDECTOMY  1970  . CATARACT EXTRACTION  01/2011   right  . COLONOSCOPY WITH PROPOFOL N/A 08/19/2019   Procedure: COLONOSCOPY WITH PROPOFOL;  Surgeon: Jonathon Bellows, MD;  Location: [facility] ENDOSCOPY;  Service: Gastroenterology;  Laterality: N/A;  . eye lid surgery  2013   bilateral  . FOOT SURGERY    . NECK SURGERY    . SPINE SURGERY    . TUBAL LIGATION    . VAGINAL HYSTERECTOMY  1989    Family History  Problem Relation Age of Onset  . Aneurysm Mother   . Aortic aneurysm Mother   . Hypertension Mother   . Hyperlipidemia Mother   . Heart disease Mother   . Obesity Mother   . Heart attack Maternal Grandfather   . Diabetes Maternal Grandfather     Social History   Tobacco Use  . Smoking status: Never Smoker  . Smokeless tobacco: Never Used  Substance Use Topics  . Alcohol use: No    Alcohol/week: 0.0 standard drinks     Current Outpatient Medications:  .  ACCU-CHEK SOFTCLIX LANCETS lancets, , Disp: , Rfl:  .  acetaminophen (TYLENOL 8 HOUR) 650 MG CR tablet, Take 1 tablet (650 mg total) by mouth every 8 (eight) hours as needed for pain., Disp: 90 tablet, Rfl: 2 .  albuterol (PROVENTIL) (2.5 MG/3ML) 0.083% nebulizer solution, Take 3 mLs (2.5 mg total) by nebulization every 6 (six) hours as needed., Disp: 75 mL, Rfl: 2 .  Alcohol Swabs (B-D SINGLE USE  SWABS REGULAR) PADS, , Disp: , Rfl:  .  amLODipine (NORVASC) 5 MG tablet, Take 1 tablet (5 mg total) by mouth daily., Disp: 90 tablet, Rfl: 1 .  ASPIRIN LOW DOSE 81 MG EC tablet, TAKE 1 TABLET (81 MG TOTAL) BY MOUTH DAILY., Disp: 30 tablet, Rfl: 0 .  Blood Glucose Monitoring Suppl (ACCU-CHEK AVIVA PLUS) w/Device KIT, , Disp: , Rfl:  .  buPROPion (WELLBUTRIN XL) 150 MG 24 hr tablet, Take 1 tablet (150 mg total) by mouth daily., Disp: 90 tablet, Rfl: 0 .  Calcium Carb-Cholecalciferol (CALCIUM 600+D3 PO), Take by mouth., Disp: , Rfl:  .  Cyanocobalamin (VITAMIN B12 PO), Take 1 tablet by mouth daily. , Disp: , Rfl:  .  furosemide (LASIX) 20 MG tablet, Take 1 tablet (20 mg total) by mouth daily., Disp: 90 tablet, Rfl: 1 .  insulin NPH-regular Human (70-30) 100 UNIT/ML injection, Inject 100 Units into the skin 2 (two) times daily with a meal., Disp: , Rfl:  .  irbesartan-hydrochlorothiazide (  AVALIDE) 300-12.5 MG tablet, Take 1 tablet by mouth daily., Disp: 90 tablet, Rfl: 1 .  levothyroxine (SYNTHROID) 25 MCG tablet, Take 1 tablet by mouth daily except, take 1 and 1/2 tablets by mouth on Sunday., Disp: 100 tablet, Rfl: 0 .  metFORMIN (GLUCOPHAGE) 500 MG tablet, , Disp: , Rfl:  .  metoprolol tartrate (LOPRESSOR) 25 MG tablet, Take 1 tablet (25 mg total) by mouth 2 (two) times daily., Disp: 180 tablet, Rfl: 1 .  metroNIDAZOLE (METROCREAM) 0.75 % cream, Apply topically in the morning and at bedtime., Disp: 45 g, Rfl: 0 .  Miconazole Nitrate 2 % POWD, Apply to skin folds twice a day, Disp: 100 g, Rfl: 0 .  Misc Natural Products (OSTEO BI-FLEX ADV JOINT SHIELD PO), Take by mouth 2 (two) times daily. , Disp: , Rfl:  .  modafinil (PROVIGIL) 100 MG tablet, Take 1 tablet (100 mg total) by mouth daily., Disp: 90 tablet, Rfl: 0 .  montelukast (SINGULAIR) 10 MG tablet, Take 1 tablet (10 mg total) by mouth at bedtime., Disp: 90 tablet, Rfl: 1 .  mupirocin ointment (BACTROBAN) 2 %, Apply 1 application topically 2  (two) times daily., Disp: 22 g, Rfl: 0 .  omeprazole (PRILOSEC) 40 MG capsule, Take 1 capsule by mouth daily., Disp: 90 capsule, Rfl: 0 .  pregabalin (LYRICA) 300 MG capsule, Take 300 mg by mouth 2 (two) times a day., Disp: , Rfl:  .  Probiotic Product (PROBIOTIC-10 PO), Take by mouth., Disp: , Rfl:  .  RELION INSULIN SYRINGE 1ML/31G 31G X 5/16" 1 ML MISC, , Disp: , Rfl:  .  rosuvastatin (CRESTOR) 20 MG tablet, Take 1 tablet (20 mg total) by mouth daily., Disp: 90 tablet, Rfl: 1 .  Semaglutide,0.25 or 0.5MG/DOS, (OZEMPIC, 0.25 OR 0.5 MG/DOSE,) 2 MG/1.5ML SOPN, Inject 0.25 mg into the skin once a week., Disp: 1.5 mL, Rfl: 0 .  sertraline (ZOLOFT) 100 MG tablet, Take 1.5 tablets (150 mg total) by mouth daily., Disp: 135 tablet, Rfl: 0 .  tiZANidine (ZANAFLEX) 4 MG tablet, , Disp: , Rfl:  .  traZODone (DESYREL) 50 MG tablet, Take 1 tablet (50 mg total) by mouth at bedtime as needed for sleep., Disp: 90 tablet, Rfl: 1 .  vitamin C (ASCORBIC ACID) 500 MG tablet, Take 500 mg by mouth 2 (two) times daily. , Disp: , Rfl:  .  VITAMIN D, CHOLECALCIFEROL, PO, Take 1,000 Units by mouth daily. , Disp: , Rfl:   Allergies  Allergen Reactions  . Codeine Other (See Comments)    "TRIPPED OUT"  DIDN'T LIKE THE [place]  . Atorvastatin     muscle pain  . Hydrocodone     itching  . Tramadol   . Latex Rash  . Zolpidem Other (See Comments)    Sleep walk    With staff assistance, above reviewed with the patient today.  ROS: As per HPI, otherwise no specific complaints on a limited and focused system review   Objective  Virtual encounter, vitals not obtained.  There is no height or weight on file to calculate BMI.  Physical Exam   Appears in NAD via conversation, 1 mild cough during our conversation, did have a slight nasal sounding voice Breathing: No obvious respiratory distress. Speaking in complete sentences Neurological: Pt is alert, Speech is normal Psychiatric: Patient has a normal mood  and affect, behavior is normal. Judgment and thought content normal.   No results found for this or any previous visit (from the past  72 hour(s)).  PHQ2/9: Depression screen [facility] 2/9 08/17/2020 06/18/2020 05/13/2020 03/12/2020 12/30/2019  Decreased Interest 0 0 3 0 0  Down, Depressed, Hopeless 0 3 3 0 0  PHQ - 2 Score 0 3 6 0 0  Altered sleeping _0 0  Tired, decreased energy _1 0 0  Change in appetite 3 0 2 0 2  Feeling bad or failure about yourself  0 0 1 0 1  Trouble concentrating 0 0 1 0 0  Moving slowly or fidgety/restless 1 1 0 0 0  Suicidal thoughts 0 0 0 0 0  PHQ-9 Score _2 Difficult doing work/chores Not difficult at all Somewhat difficult Somewhat difficult Not difficult at all Somewhat difficult  Some recent data might be hidden   PHQ-2/9 Result reviewed  Fall Risk: Fall Risk  08/17/2020 06/18/2020 03/12/2020 02/20/2020 12/30/2019  Falls in the past year? 0 0 0 0 0  Comment - - - - -  Number falls in past yr: 0 0 0 - 0  Injury with Fall? 0 0 0 - 0  Risk Factor Category  - - - - -  Comment - - - - -  Risk for fall due to : - - Impaired balance/gait;Impaired mobility Impaired mobility -  Risk for fall due to: Comment - - - uses walker -  Follow up - - - - -     Assessment & Plan 1. Cough 2. Upper respiratory tract infection, unspecified type Discussed with patient the likelihood of an upper respiratory infection, and very concerned for Covid.  Cough is the worst part presently, did have fevers earlier in the course, also with loss of taste and smell noted.  Also has some loose bowel movements.  She has not been vaccinated for Covid. Emphasized the importance of getting a Covid test, a PCR is preferred, and noted the options and places that she can get tested.  Noted if she is having difficulty getting a test today, she can call the office who can help her with this. Given her comorbidities, discussed that she would be a candidate for the monoclonal antibody  infusion, and her symptoms have been present for 7 days now.  Ideally, like to get that infusion within 10 days, and the importance of getting the test done today noted. Recommended symptomatic treatment at this time, with adding a Robitussin or Mucinex type product as needed for the cough, also staying well-hydrated, resting, and using a Tylenol product as needed for any low-grade temps or achiness. She is not to work presently, remain isolated until the Covid test is back and if negative, not to return to work until her symptoms are significantly improved.  If symptoms not improving or more problematic over time, does need to follow-up and she was understanding of this, and follow-up in a more emergent setting if her symptoms are more severe.  If the Covid test is positive, she is to contact us again, and will discuss neck steps at that time.  She was understanding of that recommendation.   3. Dyslipidemia associated with type 2 diabetes mellitus ([place]) 4. Stage 3b chronic kidney disease ([place]) 5. Morbid obesity with BMI of 40.0-44.9, adult ([place]) 6. Mild intermittent asthma in adult without complication Noted to her she has significant comorbidities that are concerning for potential increased severity of a Covid illness, and she would qualify for the antibody infusion as part of the management if she does have a  Covid positive test.   I discussed the assessment and treatment plan with the patient. The patient was provided an opportunity to ask questions and all were answered. The patient agreed with the plan and demonstrated an understanding of the instructions.  Red flags and when to present for emergency care or RTC including fevers, chest pain, shortness of breath, new/worsening/un-resolving symptoms reviewed with patient at time of visit.   The patient was advised to call back or seek an in-person evaluation if the symptoms worsen or if the condition fails to improve as anticipated.  I  provided 20 minutes of non-face-to-face time during this encounter that included discussing at length patient's sx/history, pertinent pmhx, medications, treatment and follow up plan. This time also included the necessary documentation, orders, and chart review.  Towanda Malkin, MD

## 2020-08-18 ENCOUNTER — Telehealth: Payer: Self-pay | Admitting: Family Medicine

## 2020-08-18 NOTE — Telephone Encounter (Signed)
Please find out if the patient was seen and evaluated when she had the CXR report and if so, please try to get that documentation as well to review. Thanks.

## 2020-08-18 NOTE — Telephone Encounter (Signed)
Patient checking on the status if negative COVID results and chest x ray reflecting heart enlargment was received from [facility] in [place], please note if received, forms faxed to [phone].

## 2020-08-18 NOTE — Telephone Encounter (Signed)
Pt had video visit with dr hendrickson yesterday for cough. Pt went to [facility] in [place] and had a covid test. The covid test was neg. Pt also had chest xray and per pt one of her lung is cloudy and she has enlarge heart. The pt will have urgent care fax over the xray report.

## 2020-08-18 NOTE — Telephone Encounter (Signed)
Please let the patient know that we did get the fax results. The chest x-ray report seemed more consistent with a bronchitis concern and a right pneumonia.  Cardiomegaly was also noted on the in clinic reading.  The significance of this is not entirely clear presently She was prescribed an albuterol inhaler as well as an azithromycin product to help manage. It was often recommended following up if symptoms not improved or more problematic as the week is progressing.  When the current symptoms are resolved, can make a follow-up visit to help reassess.   Thanks

## 2020-08-19 ENCOUNTER — Encounter: Payer: Self-pay | Admitting: Family Medicine

## 2020-08-26 ENCOUNTER — Ambulatory Visit (INDEPENDENT_AMBULATORY_CARE_PROVIDER_SITE_OTHER): Payer: Medicare HMO

## 2020-08-26 DIAGNOSIS — Z0000 Encounter for general adult medical examination without abnormal findings: Secondary | ICD-10-CM | POA: Diagnosis not present

## 2020-08-26 DIAGNOSIS — Z1231 Encounter for screening mammogram for malignant neoplasm of breast: Secondary | ICD-10-CM | POA: Diagnosis not present

## 2020-08-26 NOTE — Patient Instructions (Signed)
Summer [name] , Thank you for taking time to come for your Medicare Wellness Visit. I appreciate your ongoing commitment to your health goals. Please review the following plan we discussed and let me know if I can assist you in the future.   Screening recommendations/referrals: Colonoscopy: done 08/19/19 Mammogram: done 09/19/19. Please call [phone] to schedule your mammogram.  Bone Density: done 08/13/19 Recommended yearly ophthalmology/optometry visit for glaucoma screening and checkup Recommended yearly dental visit for hygiene and checkup  Vaccinations: Influenza vaccine: done 06/18/20 Pneumococcal vaccine: done 06/16/14 Tdap vaccine: done 07/20/15 Shingles vaccine: Shingrix discussed. Please contact your pharmacy for coverage information.  Covid-19: done 11/07/19 & 12/02/19  Conditions/risks identified: Recommend increasing physical activity   Next appointment: Follow up in one year for your annual wellness visit    Preventive Care 71 Years and Older, Female Preventive care refers to lifestyle choices and visits with your health care provider that can promote health and wellness. What does preventive care include?  A yearly physical exam. This is also called an annual well check.  Dental exams once or twice a year.  Routine eye exams. Ask your health care provider how often you should have your eyes checked.  Personal lifestyle choices, including:  Daily care of your teeth and gums.  Regular physical activity.  Eating a healthy diet.  Avoiding tobacco and drug use.  Limiting alcohol use.  Practicing safe sex.  Taking low-dose aspirin every day.  Taking vitamin and mineral supplements as recommended by your health care provider. What happens during an annual well check? The services and screenings done by your health care provider during your annual well check will depend on your age, overall health, lifestyle risk factors, and family history of disease. Counseling   Your health care provider may ask you questions about your:  Alcohol use.  Tobacco use.  Drug use.  Emotional well-being.  Home and relationship well-being.  Sexual activity.  Eating habits.  History of falls.  Memory and ability to understand (cognition).  Work and work Statistician.  Reproductive health. Screening  You may have the following tests or measurements:  Height, weight, and BMI.  Blood pressure.  Lipid and cholesterol levels. These may be checked every 5 years, or more frequently if you are over 63 years old.  Skin check.  Lung cancer screening. You may have this screening every year starting at age 63 if you have a 30-pack-year history of smoking and currently smoke or have quit within the past 15 years.  Fecal occult blood test (FOBT) of the stool. You may have this test every year starting at age 1.  Flexible sigmoidoscopy or colonoscopy. You may have a sigmoidoscopy every 5 years or a colonoscopy every 10 years starting at age 58.  Hepatitis C blood test.  Hepatitis B blood test.  Sexually transmitted disease (STD) testing.  Diabetes screening. This is done by checking your blood sugar (glucose) after you have not eaten for a while (fasting). You may have this done every 1-3 years.  Bone density scan. This is done to screen for osteoporosis. You may have this done starting at age 26.  Mammogram. This may be done every 1-2 years. Talk to your health care provider about how often you should have regular mammograms. Talk with your health care provider about your test results, treatment options, and if necessary, the need for more tests. Vaccines  Your health care provider may recommend certain vaccines, such as:  Influenza vaccine. This is recommended every  year.  Tetanus, diphtheria, and acellular pertussis (Tdap, Td) vaccine. You may need a Td booster every 10 years.  Zoster vaccine. You may need this after age 20.  Pneumococcal 13-valent  conjugate (PCV13) vaccine. One dose is recommended after age 63.  Pneumococcal polysaccharide (PPSV23) vaccine. One dose is recommended after age 62. Talk to your health care provider about which screenings and vaccines you need and how often you need them. This information is not intended to replace advice given to you by your health care provider. Make sure you discuss any questions you have with your health care provider. Document Released: 09/24/2015 Document Revised: 05/17/2016 Document Reviewed: 06/29/2015 Elsevier Interactive Patient Education  2017 [place] Prevention in the Home Falls can cause injuries. They can happen to people of all ages. There are many things you can do to make your home safe and to help prevent falls. What can I do on the outside of my home?  Regularly fix the edges of walkways and driveways and fix any cracks.  Remove anything that might make you trip as you walk through a door, such as a raised step or threshold.  Trim any bushes or trees on the path to your home.  Use bright outdoor lighting.  Clear any walking paths of anything that might make someone trip, such as rocks or tools.  Regularly check to see if handrails are loose or broken. Make sure that both sides of any steps have handrails.  Any raised decks and porches should have guardrails on the edges.  Have any leaves, snow, or ice cleared regularly.  Use sand or salt on walking paths during winter.  Clean up any spills in your garage right away. This includes oil or grease spills. What can I do in the bathroom?  Use night lights.  Install grab bars by the toilet and in the tub and shower. Do not use towel bars as grab bars.  Use non-skid mats or decals in the tub or shower.  If you need to sit down in the shower, use a plastic, non-slip stool.  Keep the floor dry. Clean up any water that spills on the floor as soon as it happens.  Remove soap buildup in the tub or  shower regularly.  Attach bath mats securely with double-sided non-slip rug tape.  Do not have throw rugs and other things on the floor that can make you trip. What can I do in the bedroom?  Use night lights.  Make sure that you have a light by your bed that is easy to reach.  Do not use any sheets or blankets that are too big for your bed. They should not hang down onto the floor.  Have a firm chair that has side arms. You can use this for support while you get dressed.  Do not have throw rugs and other things on the floor that can make you trip. What can I do in the kitchen?  Clean up any spills right away.  Avoid walking on wet floors.  Keep items that you use a lot in easy-to-reach places.  If you need to reach something above you, use a strong step stool that has a grab bar.  Keep electrical cords out of the way.  Do not use floor polish or wax that makes floors slippery. If you must use wax, use non-skid floor wax.  Do not have throw rugs and other things on the floor that can make you trip. What can  I do with my stairs?  Do not leave any items on the stairs.  Make sure that there are handrails on both sides of the stairs and use them. Fix handrails that are broken or loose. Make sure that handrails are as long as the stairways.  Check any carpeting to make sure that it is firmly attached to the stairs. Fix any carpet that is loose or worn.  Avoid having throw rugs at the top or bottom of the stairs. If you do have throw rugs, attach them to the floor with carpet tape.  Make sure that you have a light switch at the top of the stairs and the bottom of the stairs. If you do not have them, ask someone to add them for you. What else can I do to help prevent falls?  Wear shoes that:  Do not have high heels.  Have rubber bottoms.  Are comfortable and fit you well.  Are closed at the toe. Do not wear sandals.  If you use a stepladder:  Make sure that it is fully  opened. Do not climb a closed stepladder.  Make sure that both sides of the stepladder are locked into place.  Ask someone to hold it for you, if possible.  Clearly mark and make sure that you can see:  Any grab bars or handrails.  First and last steps.  Where the edge of each step is.  Use tools that help you move around (mobility aids) if they are needed. These include:  Canes.  Walkers.  Scooters.  Crutches.  Turn on the lights when you go into a dark area. Replace any light bulbs as soon as they burn out.  Set up your furniture so you have a clear path. Avoid moving your furniture around.  If any of your floors are uneven, fix them.  If there are any pets around you, be aware of where they are.  Review your medicines with your doctor. Some medicines can make you feel dizzy. This can increase your chance of falling. Ask your doctor what other things that you can do to help prevent falls. This information is not intended to replace advice given to you by your health care provider. Make sure you discuss any questions you have with your health care provider. Document Released: 06/24/2009 Document Revised: 02/03/2016 Document Reviewed: 10/02/2014 Elsevier Interactive Patient Education  2017 [employer].

## 2020-08-26 NOTE — Progress Notes (Signed)
Subjective:   Summer [name] is a 69 y.o. female who presents for Medicare Annual (Subsequent) preventive examination.  Review of Systems     Cardiac Risk Factors include: advanced age (>83mn, >>62women);dyslipidemia;hypertension;obesity (BMI >30kg/m2)     Objective:    There were no vitals filed for this visit. There is no height or weight on file to calculate BMI.  Advanced Directives 08/26/2020 07/31/2019 04/07/2019 04/02/2019 08/11/2018 07/26/2018 05/28/2018  Does Patient Have a Medical Advance Directive? Yes Yes Yes No Yes Yes No  Type of AParamedicof APort [place]Living will [place] will Living will - Living will;Healthcare Power of [place] will -  Does patient want to make changes to medical advance directive? - - No - Patient declined - - - -  Copy of [place] Chart? Yes - validated most recent copy scanned in chart (See row information) Yes - validated most recent copy scanned in chart (See row information) - - - Yes - validated most recent copy scanned in chart (See row information) -  Would patient like information on creating a medical advance directive? - - - No - Patient declined - - No - Patient declined  Some encounter information is confidential and restricted. Go to Review Flowsheets activity to see all data.    Current Medications (verified) Outpatient Encounter Medications as of 08/26/2020  Medication Sig  . ACCU-CHEK SOFTCLIX LANCETS lancets   . acetaminophen (TYLENOL 8 HOUR) 650 MG CR tablet Take 1 tablet (650 mg total) by mouth every 8 (eight) hours as needed for pain.  . Alcohol Swabs (B-D SINGLE USE SWABS REGULAR) PADS   . ASPIRIN LOW DOSE 81 MG EC tablet TAKE 1 TABLET (81 MG TOTAL) BY MOUTH DAILY.  .Marland KitchenBlood Glucose Monitoring Suppl (ACCU-CHEK AVIVA PLUS) w/Device KIT   . buPROPion (WELLBUTRIN XL) 150 MG 24 hr tablet Take 1 tablet (150 mg total) by mouth  daily.  . Calcium Carb-Cholecalciferol (CALCIUM 600+D3 PO) Take by mouth.  . Cyanocobalamin (VITAMIN B12 PO) Take 1 tablet by mouth daily.   . furosemide (LASIX) 20 MG tablet Take 1 tablet (20 mg total) by mouth daily.  . insulin NPH-regular Human (70-30) 100 UNIT/ML injection Inject 100 Units into the skin 2 (two) times daily with a meal.  . irbesartan-hydrochlorothiazide (AVALIDE) 300-12.5 MG tablet Take 1 tablet by mouth daily.  .Marland Kitchenlevothyroxine (SYNTHROID) 25 MCG tablet Take 1 tablet by mouth daily except, take 1 and 1/2 tablets by mouth on Sunday.  . metFORMIN (GLUCOPHAGE) 500 MG tablet Take 500 mg by mouth daily.  . metoprolol tartrate (LOPRESSOR) 25 MG tablet Take 1 tablet (25 mg total) by mouth 2 (two) times daily.  . Misc Natural Products (OSTEO BI-FLEX ADV JOINT SHIELD PO) Take by mouth 2 (two) times daily.   . modafinil (PROVIGIL) 100 MG tablet Take 1 tablet (100 mg total) by mouth daily.  . montelukast (SINGULAIR) 10 MG tablet Take 1 tablet (10 mg total) by mouth at bedtime.  . mupirocin ointment (BACTROBAN) 2 % Apply 1 application topically 2 (two) times daily.  .Marland Kitchenomeprazole (PRILOSEC) 40 MG capsule Take 1 capsule by mouth daily.  . pregabalin (LYRICA) 300 MG capsule Take 300 mg by mouth 2 (two) times a day.  . Probiotic Product (PROBIOTIC-10 PO) Take by mouth.  .Daryll BrodINSULIN SYRINGE 1ML/31G 31G X 5/16" 1 ML MISC   . rosuvastatin (CRESTOR) 20 MG tablet Take 1 tablet (20  mg total) by mouth daily.  . sertraline (ZOLOFT) 100 MG tablet Take 1.5 tablets (150 mg total) by mouth daily.  Marland Kitchen tiZANidine (ZANAFLEX) 4 MG tablet   . traZODone (DESYREL) 50 MG tablet Take 1 tablet (50 mg total) by mouth at bedtime as needed for sleep.  . vitamin C (ASCORBIC ACID) 500 MG tablet Take 500 mg by mouth 2 (two) times daily.   Marland Kitchen VITAMIN D, CHOLECALCIFEROL, PO Take 1,000 Units by mouth daily.   Marland Kitchen albuterol (PROVENTIL) (2.5 MG/3ML) 0.083% nebulizer solution Take 3 mLs (2.5 mg total) by nebulization  every 6 (six) hours as needed. (Patient not taking: Reported on 08/26/2020)  . albuterol (VENTOLIN HFA) 108 (90 Base) MCG/ACT inhaler  (Patient not taking: Reported on 08/26/2020)  . amLODipine (NORVASC) 5 MG tablet Take 1 tablet (5 mg total) by mouth daily.  . metroNIDAZOLE (METROCREAM) 0.75 % cream Apply topically in the morning and at bedtime.  . Miconazole Nitrate 2 % POWD Apply to skin folds twice a day (Patient not taking: Reported on 08/26/2020)  . Semaglutide,0.25 or 0.5MG/DOS, (OZEMPIC, 0.25 OR 0.5 MG/DOSE,) 2 MG/1.5ML SOPN Inject 0.25 mg into the skin once a week. (Patient not taking: Reported on 08/26/2020)   No facility-administered encounter medications on file as of 08/26/2020.    Allergies (verified) Codeine, Atorvastatin, Hydrocodone, Tramadol, Latex, and Zolpidem   History: Past Medical History:  Diagnosis Date  . Anemia   . Arthritis   . Asthma   . B12 deficiency   . Back pain   . Carpal tunnel syndrome   . Chronic kidney disease   . Constipation   . Depression   . Depressive disorder   . Diabetes mellitus   . Dyspnea   . Fluid retention   . Foot pain   . GERD (gastroesophageal reflux disease)   . Headache   . History of hiatal hernia   . Hyperlipidemia   . Hypertension   . IBS (irritable bowel syndrome)   . Insomnia   . Joint pain   . Lumbago   . Memory loss   . Obesity   . 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)   . Pneumonia   . Rhinitis, allergic   . Rosacea   . Sleep apnea   . SOB (shortness of breath)   . Thyroid disease   . Unspecified [name] loss   . Unspecified hereditary and idiopathic peripheral neuropathy   . Unspecified sleep apnea   . Vitamin D deficiency    Past Surgical History:  Procedure Laterality Date  . ABDOMINAL HYSTERECTOMY  1975  . ANKLE SURGERY Left approx Jan 2018  . APPENDECTOMY  1970  . CATARACT EXTRACTION  01/2011   right  . COLONOSCOPY WITH PROPOFOL N/A 08/19/2019   Procedure: COLONOSCOPY WITH PROPOFOL;  Surgeon: Jonathon Bellows, MD;  Location: [facility] ENDOSCOPY;  Service: Gastroenterology;  Laterality: N/A;  . eye lid surgery  2013   bilateral  . FOOT SURGERY    . NECK SURGERY    . SPINE SURGERY    . TUBAL LIGATION    . VAGINAL HYSTERECTOMY  1989   Family History  Problem Relation Age of Onset  . Aneurysm Mother   . Aortic aneurysm Mother   . Hypertension Mother   . Hyperlipidemia Mother   . Heart disease Mother   . Obesity Mother   . Heart attack Maternal Grandfather   . Diabetes Maternal Grandfather    Social History   Socioeconomic History  . Marital status: Significant Other  Spouse name: [name] Rump  . Number of children: 1  . Years of education: Not on file  . Highest education level: High school graduate  Occupational History  . Occupation: retired  Tobacco Use  . Smoking status: Never Smoker  . Smokeless tobacco: Never Used  Vaping Use  . Vaping Use: Never used  Substance and Sexual Activity  . Alcohol use: No    Alcohol/week: 0.0 standard drinks  . Drug use: No  . Sexual activity: Yes    Partners: Male  Other Topics Concern  . Not on file  Social History Narrative   Son lives in [place] (retired from [employer]) and her granddaughter (41yr old) [place]   Social Determinants of Health   Financial Resource Strain: Low Risk   . Difficulty of Paying Living Expenses: Not hard at all  Food Insecurity: No Food Insecurity  . Worried About RCharity fundraiserin the Last Year: Never true  . Ran Out of Food in the Last Year: Never true  Transportation Needs: No Transportation Needs  . Lack of Transportation (Medical): No  . Lack of Transportation (Non-Medical): No  Physical Activity: Inactive  . Days of Exercise per Week: 0 days  . Minutes of Exercise per Session: 0 min  Stress: No Stress Concern Present  . Feeling of Stress : Only a little  Social Connections: Moderately Isolated  . Frequency of Communication with Friends and Family: More than  three times a week  . Frequency of Social Gatherings with Friends and Family: Once a week  . Attends Religious Services: Never  . Active Member of Clubs or Organizations: No  . Attends CArchivistMeetings: Never  . Marital Status: Living with partner    Tobacco Counseling Counseling given: Not Answered   Clinical Intake:  Pre-visit preparation completed: Yes  Pain : No/denies pain     Nutritional Risks: None Diabetes: Yes CBG done?: No Did pt. bring in CBG monitor from home?: No   Nutrition Risk Assessment:  Has the patient had any N/V/D within the last 2 months?  No  Does the patient have any non-healing wounds?  No  Has the patient had any unintentional weight loss or weight gain?  No   Diabetes:  Is the patient diabetic?  Yes  If diabetic, was a CBG obtained today?  No  Did the patient bring in their glucometer from home?  No  How often do you monitor your CBG's? daily.   Financial Strains and Diabetes Management:  Are you having any financial strains with the device, your supplies or your medication? No .  Does the patient want to be seen by Chronic Care Management for management of their diabetes?  No  Would the patient like to be referred to a Nutritionist or for Diabetic Management?  No   Diabetic Exams:  Diabetic [facility]   Diabetic Foot Exam: Completed 03/12/20.    How often do you need to have someone help you when you read instructions, pamphlets, or other written materials from your doctor or pharmacy?: 1 - Never    Interpreter Needed?: No  Information entered by :: KClemetine MarkerLPN   Activities of Daily Living In your present state of health, do you have any difficulty performing the following activities: 08/26/2020 08/17/2020  [name]? N N  Comment declines [name] aids -  Vision? N N  Difficulty concentrating or making decisions? N N  Walking or climbing stairs? Y Y   Dressing or bathing? N N  Doing errands, shopping? N N  Preparing Food and eating ? N -  Using the Toilet? N -  In the past six months, have you accidently leaked urine? N -  Do you have problems with loss of bowel control? N -  Managing your Medications? N -  Managing your Finances? N -  Housekeeping or managing your Housekeeping? N -  Some recent data might be hidden    Patient Care Team: Steele Sizer, MD as PCP - General (Family Medicine) End, Harrell Gave, MD as PCP - Cardiology (Cardiology) Gardiner Barefoot, DPM as Consulting Physician (Podiatry) Neldon Labella, RN as Registered Nurse Lonia Farber, MD as Consulting Physician (Endocrinology) Sharlet Salina, MD as Referring Physician (Physical Medicine and Rehabilitation)  Indicate any recent Medical Services you may have received from other than [facility] providers in the past year (date may be approximate).     Assessment:   This is a routine wellness examination for Summer [name].  [name]/Vision screen  [name] Screening   _0  _1  _2  _3  _4  _5  _6  _7  _8   Right ear:           Left ear:           Comments: Pt denies [name] difficulty  Vision Screening Comments: Annual vision screenings at [facility]   Dietary issues and exercise activities discussed: Current Exercise Habits: The patient does not participate in regular exercise at present, Exercise limited by: orthopedic condition(s);neurologic condition(s)  Goals    .  "I need help paying for this new medication" (pt-stated)      Clinical Goal(s): Over the next 10 days, Summer [name] will provide the necessary supplementary documents (proof of out of pocket prescription expenditure, proof of household income) needed for medication assistance applications to CCM pharmacist.   Interventions:  CCM pharmacist will apply for assistance for Vascepa made by [employer].   CCM pharmacist will collaborate with primary care  physician to complete a tier exception request to lower the tier for Vascepa on patient's formulary, helping to lower copay cost.      .  My financial stress is affecting my mental health (pt-stated)      Current Barriers:  Marland Kitchen Knowledge Deficits related to available resources for financial counseling  . Film/video editor.   Nurse Case Manager Clinical Goal(s):  Marland Kitchen Over the next 14 days, patient will report utilization of provided resources to explore options for financial counseling- goal unmet . Over the next 14 days, patient will report taking all medications including antidepressant as prescribed-goal met 05/02/2019 . Over the next 14 days, patient will report receiving appointment with mental health provider-in process  Interventions:  . Assessed for utilization of previously provided financial counseling resources . Assessed for follow through of contacting [employer] to find an in-network psychiatrist . Provided emotional support and reassurance  Patient Self Care Activities:  . Will request referral to in-network psychiatry provided from pcp . Calls provider office for new concerns or questions  Please see past updates related to this goal by clicking on the "Past Updates" button in the selected goal      .  Weight (lb) < 200 lb (90.7 kg)      Pt would like to lose weight to feel better. Planning for gastric bypass surgery.      Depression Screen PHQ 2/9 Scores 08/26/2020 08/17/2020 06/18/2020 05/13/2020 03/12/2020 12/30/2019 11/03/2019  PHQ - 2 Score 0 0  3 6 0 0 3  PHQ- 9 Score - _0 Fall Risk Fall Risk  08/26/2020 08/17/2020 06/18/2020 03/12/2020 02/20/2020  Falls in the past year? - 0 0 0 0  Comment - - - - -  Number falls in past yr: 0 0 0 0 -  Injury with Fall? 0 0 0 0 -  Risk Factor Category  - - - - -  Comment - - - - -  Risk for fall due to : Impaired balance/gait - - Impaired balance/gait;Impaired mobility Impaired mobility  Risk for fall due to:  Comment - - - - uses walker  Follow up Falls prevention discussed - - - -    FALL RISK PREVENTION PERTAINING TO THE HOME:  Any stairs in or around the home? Yes  If so, are there any without handrails? No  Home free of loose throw rugs in walkways, pet beds, electrical cords, etc? Yes  Adequate lighting in your home to reduce risk of falls? Yes   ASSISTIVE DEVICES UTILIZED TO PREVENT FALLS:  Life alert? No  Use of a cane, walker or w/c? Yes  Grab bars in the bathroom? No  Shower chair or bench in shower? Yes  Elevated toilet seat or a handicapped toilet? Yes   TIMED UP AND GO:  Was the test performed? No . Telephonic visit.   Cognitive Function:     6CIT Screen 08/26/2020 07/31/2019 07/26/2018 06/27/2017  What Year? 0 points 0 points 0 points 0 points  What month? 0 points 0 points 0 points 0 points  What time? 0 points 0 points 0 points 0 points  Count back from 20 0 points 0 points 0 points 0 points  Months in reverse 0 points 0 points 0 points 0 points  Repeat phrase 0 points 0 points 0 points 0 points  Total Score 0 0 0 0    Immunizations Immunization History  Administered Date(s) Administered  . Fluad Quad(high Dose 65+) 06/03/2019, 06/18/2020  . Influenza Split 06/13/2013, 06/16/2014  . Influenza, High Dose Seasonal PF 05/24/2015, 06/27/2017, 05/27/2018  . Influenza, Seasonal, Injecte, Preservative Fre 06/03/2010, 07/25/2011  . Influenza-Unspecified 07/21/2016  . PFIZER SARS-COV-2 Vaccination 11/07/2019, 12/02/2019  . Pneumococcal Conjugate-13 07/14/2013  . Pneumococcal Polysaccharide-23 10/01/2006, 06/16/2014  . Tdap 03/04/2010, 07/20/2015  . Zoster 08/12/2012    TDAP status: Up to date  Flu Vaccine status: Up to date  Pneumococcal vaccine status: Up to date  Covid-19 vaccine status: Completed vaccines  Qualifies for Shingles Vaccine? Yes   Zostavax completed Yes   Shingrix Completed?: No.    Education has been provided regarding the importance of  this vaccine. Patient has been advised to call insurance company to determine out of pocket expense if they have not yet received this vaccine. Advised may also receive vaccine at local pharmacy or Health Dept. Verbalized acceptance and understanding.  Screening Tests Health Maintenance  Topic Date Due  . COVID-19 Vaccine (3 - Pfizer risk 4-dose series) 12/30/2019  . HEMOGLOBIN A1C  11/10/2020  . FOOT EXAM  03/12/2021  . OPHTHALMOLOGY EXAM  07/07/2021  . MAMMOGRAM  09/18/2021  . TETANUS/TDAP  07/19/2025  . COLONOSCOPY  08/18/2029  . INFLUENZA VACCINE  Completed  . DEXA SCAN  Completed  . Hepatitis C Screening  Completed  . PNA vac Low Risk Adult  Completed    Health Maintenance  Health Maintenance Due  Topic Date Due  . COVID-19 Vaccine (3 - Pfizer risk  4-dose series) 12/30/2019     Colorectal cancer screening: Type of screening: Colonoscopy. Completed 06/19/19. Repeat every 10 years  Mammogram status: Completed 09/19/19. Repeat every year. Ordered today  Bone Density status: Completed 08/13/19. Results reflect: Bone density results: NORMAL. Repeat every 2 years.  Lung Cancer Screening: (Low Dose CT Chest recommended if Age 34-80 years, 30 pack-year currently smoking OR have quit w/in 15years.) does not qualify.   Additional Screening:  Hepatitis C Screening: does qualify; Completed 08/15/12  Vision Screening: Recommended annual ophthalmology exams for early detection of glaucoma and other disorders of the eye. Is the patient up to date with their annual eye exam?  Yes  Who is the provider or what is the name of the office in which the patient attends annual eye exams? [facility] If pt is not established with a provider, would they like to be referred to a provider to establish care? No .   Dental Screening: Recommended annual dental exams for proper oral hygiene  Community Resource Referral / Chronic Care Management: CRR required this visit?  No   CCM required this  visit?  No      Plan:     I have personally reviewed and noted the following in the patient's chart:   . Medical and social history . Use of alcohol, tobacco or illicit drugs  . Current medications and supplements . Functional ability and status . Nutritional status . Physical activity . Advanced directives . List of other physicians . Hospitalizations, surgeries, and ER visits in previous 12 months . Vitals . Screenings to include cognitive, depression, and falls . Referrals and appointments  In addition, I have reviewed and discussed with patient certain preventive protocols, quality metrics, and best practice recommendations. A written personalized care plan for preventive services as well as general preventive health recommendations were provided to patient.     Clemetine Marker, LPN   [id number]   Nurse Notes: pt reports being sick recently and had negative covid test at urgent care last week. She also had chest xray that showed an enlarged valve and scheduled for follow up with cardiology tomorrow. Pt also states she has been unable to obtain a nebulizer due to cost and pharmacy unable to bill Medicare for DME supplies. Pt advised to contact [employer] for participating DME supplier to send rx.   Msg sent to CCM for follow up.

## 2020-08-27 ENCOUNTER — Encounter: Payer: Self-pay | Admitting: Family

## 2020-08-27 ENCOUNTER — Other Ambulatory Visit: Payer: Self-pay | Admitting: Family Medicine

## 2020-08-27 ENCOUNTER — Ambulatory Visit: Payer: Medicare HMO | Admitting: Family

## 2020-08-27 ENCOUNTER — Other Ambulatory Visit: Payer: Self-pay

## 2020-08-27 VITALS — BP 120/50 | HR 72 | Ht 62.0 in | Wt 241.2 lb

## 2020-08-27 DIAGNOSIS — F331 Major depressive disorder, recurrent, moderate: Secondary | ICD-10-CM

## 2020-08-27 DIAGNOSIS — R0600 Dyspnea, unspecified: Secondary | ICD-10-CM | POA: Diagnosis not present

## 2020-08-27 DIAGNOSIS — E782 Mixed hyperlipidemia: Secondary | ICD-10-CM | POA: Diagnosis not present

## 2020-08-27 DIAGNOSIS — I10 Essential (primary) hypertension: Secondary | ICD-10-CM

## 2020-08-27 DIAGNOSIS — R0609 Other forms of dyspnea: Secondary | ICD-10-CM

## 2020-08-27 DIAGNOSIS — I517 Cardiomegaly: Secondary | ICD-10-CM | POA: Diagnosis not present

## 2020-08-27 DIAGNOSIS — E039 Hypothyroidism, unspecified: Secondary | ICD-10-CM

## 2020-08-27 NOTE — Progress Notes (Signed)
Office Visit    Patient Name: Summer [name] Date of Encounter: 08/27/2020  Primary Care Provider:  Steele Sizer, MD Primary Cardiologist:  [name], MD Electrophysiologist:  None   Chief Complaint    Summer [name] is a 69 y.o. female with a hx of HFpEF, DM 2, asthma, OSA, morbid obesity presents today for concerns regarding chest x-ray report showing borderline cardiomegaly.  Past Medical History    Past Medical History:  Diagnosis Date  . Anemia   . Arthritis   . Asthma   . B12 deficiency   . Back pain   . Carpal tunnel syndrome   . Chronic kidney disease   . Constipation   . Depression   . Depressive disorder   . Diabetes mellitus   . Dyspnea   . Fluid retention   . Foot pain   . GERD (gastroesophageal reflux disease)   . Headache   . History of hiatal hernia   . Hyperlipidemia   . Hypertension   . IBS (irritable bowel syndrome)   . Insomnia   . Joint pain   . Lumbago   . Memory loss   . Obesity   . 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)   . Pneumonia   . Rhinitis, allergic   . Rosacea   . Sleep apnea   . SOB (shortness of breath)   . Thyroid disease   . Unspecified hearing loss   . Unspecified hereditary and idiopathic peripheral neuropathy   . Unspecified sleep apnea   . Vitamin D deficiency    Past Surgical History:  Procedure Laterality Date  . ABDOMINAL HYSTERECTOMY  1975  . ANKLE SURGERY Left approx Jan 2018  . APPENDECTOMY  1970  . CATARACT EXTRACTION  01/2011   right  . COLONOSCOPY WITH PROPOFOL N/A 08/19/2019   Procedure: COLONOSCOPY WITH PROPOFOL;  Surgeon: Jonathon Bellows, MD;  Location: [facility];  Service: Gastroenterology;  Laterality: N/A;  . eye lid surgery  2013   bilateral  . FOOT SURGERY    . NECK SURGERY    . SPINE SURGERY    . TUBAL LIGATION    . VAGINAL HYSTERECTOMY  1989    Allergies  Allergies  Allergen Reactions  . Codeine Other (See Comments)    "TRIPPED OUT"  DIDN'T LIKE THE [place]  . Atorvastatin      muscle pain  . Hydrocodone     itching  . Tramadol   . Latex Rash  . Zolpidem Other (See Comments)    Sleep walk    History of Present Illness    Summer [name] is a 69 y.o. female with a hx of HFpEF, DM 2, asthma, OSA, morbid obesity last seen 02/12/20 by Dr. Saunders Revel.  Establish with Dr. Saunders Revel 07/2019 for preoperative evaluation in anticipation bariatric surgery.  She had longstanding exertional dyspnea with minimal activity.  Echocardiogram with normal LVEF.  Due to EKG with septal Q waves pharmacological myocardial PET/CT at [facility] was performed which is normal showing only mild coronary artery calcification.  When last seen 02/11/2020 she reported chronic exertional dyspnea at her baseline with no chest pain, palpitations.  She was still awaiting clearance due to financial constraints for bariatric surgery.  She had chest x-ray 08/17/2020 at outside facility with findings indicating borderline cardiomegaly, bibasilar peribronchial changes.  She was treated with albuterol and azithromycin.  We reviewed her chest x-ray performed 08/17/2020.  Also reviewed chest x-ray performed 08/19/2020 which also showed heart at upper limits of  normal.  We reviewed her echocardiogram from 03/2019 which showed mild LVH likely contributory to upper normal heart size.  We discussed that we prevent LVH from progressing by keeping her blood pressure and volume status well controlled.  Her blood pressure is well controlled on present doses of antihypertensive regimen and she is euvolemic on present dose of Lasix.  She was reassured.  She reports feeling overall well.  She has no shortness of breath at rest and tells me her dyspnea on exertion is stable at her baseline and longstanding over the last few years.  Reports no chest pain, pressure, or tightness. No edema, orthopnea, PND. Reports no palpitations.    EKGs/Labs/Other Studies Reviewed:   The following studies were reviewed today:  Echo 04/03/19  1. The left ventricle has  hyperdynamic systolic function, with an  ejection fraction of >65%. The cavity size was normal. There is mildly  increased left ventricular wall thickness. Left ventricular diastolic  parameters were normal. No evidence of left  ventricular regional wall motion abnormalities.   2. The right ventricle has normal systolic function. The cavity was  normal. There is no increase in right ventricular wall thickness. Right  ventricular systolic pressure could not be assessed.   3. The mitral valve is degenerative. Mild thickening of the mitral valve  leaflet. There is moderate mitral annular calcification present.   4. The aortic valve has an indeterminate number of cusps.   5. The aorta is normal in size and structure.   6. The interatrial septum was not well visualized.    EKG:  EKG is ordered today.  The ekg ordered today demonstrates NSR 72 bpm with poor R wave progression.  Single PVC.  No acute ST/T wave changes.  Recent Labs: 05/13/2020: ALT 14; BUN 49; Creatinine, Ser 1.30; Hemoglobin 11.1; Platelets 153; Potassium 5.0; Sodium 142; TSH 4.630  Recent Lipid Panel    Component Value Date/Time   CHOL 209 (H) 05/13/2020 1146   TRIG 309 (H) 05/13/2020 1146   HDL 38 (L) 05/13/2020 1146   CHOLHDL 5.5 (H) 05/13/2020 1146   CHOLHDL 4.8 04/02/2019 1743   VLDL 40 04/02/2019 1743   LDLCALC 117 (H) 05/13/2020 1146   [place]  05/01/2018 1418     Comment:     . LDL cholesterol not calculated. Triglyceride levels greater than 400 mg/dL invalidate calculated LDL results. . Reference range: <100 . Desirable range <100 mg/dL for primary prevention;   <70 mg/dL for patients with CHD or diabetic patients  with > or = 2 CHD risk factors. Marland Kitchen LDL-C is now calculated using the Martin-Hopkins  calculation, which is a validated novel method providing  better accuracy than the Friedewald equation in the  estimation of LDL-C.  Cresenciano Genre et al. Annamaria Helling. 0109;323(55): 2061-2068   (http://education.[employer].com/faq/FAQ164)     Home Medications   Current Meds  Medication Sig  . ACCU-CHEK SOFTCLIX LANCETS lancets   . acetaminophen (TYLENOL 8 HOUR) 650 MG CR tablet Take 1 tablet (650 mg total) by mouth every 8 (eight) hours as needed for pain.  Marland Kitchen albuterol (PROVENTIL) (2.5 MG/3ML) 0.083% nebulizer solution Take 3 mLs (2.5 mg total) by nebulization every 6 (six) hours as needed.  Marland Kitchen albuterol (VENTOLIN HFA) 108 (90 Base) MCG/ACT inhaler every 6 (six) hours as needed.  . Alcohol Swabs (B-D SINGLE USE SWABS REGULAR) PADS   . amLODipine (NORVASC) 5 MG tablet Take 1 tablet (5 mg total) by mouth daily.  . ASPIRIN LOW DOSE 81 MG EC  tablet TAKE 1 TABLET (81 MG TOTAL) BY MOUTH DAILY.  Marland Kitchen Blood Glucose Monitoring Suppl (ACCU-CHEK AVIVA PLUS) w/Device KIT   . buPROPion (WELLBUTRIN XL) 150 MG 24 hr tablet Take 1 tablet by mouth daily.  . Calcium Carb-Cholecalciferol (CALCIUM 600+D3 PO) Take by mouth.  . Cyanocobalamin (VITAMIN B12 PO) Take 1 tablet by mouth daily.   . furosemide (LASIX) 20 MG tablet Take 1 tablet (20 mg total) by mouth daily.  . insulin NPH-regular Human (70-30) 100 UNIT/ML injection Inject 100 Units into the skin 2 (two) times daily with a meal.  . irbesartan-hydrochlorothiazide (AVALIDE) 300-12.5 MG tablet Take 1 tablet by mouth daily.  Marland Kitchen levothyroxine (SYNTHROID) 25 MCG tablet Take 1 tablet by mouth daily except, take 1 and 1/2 tablets by mouth on Sunday.  . metFORMIN (GLUCOPHAGE) 500 MG tablet Take 500 mg by mouth daily.  . metoprolol tartrate (LOPRESSOR) 25 MG tablet Take 1 tablet (25 mg total) by mouth 2 (two) times daily.  . metroNIDAZOLE (METROCREAM) 0.75 % cream Apply topically in the morning and at bedtime.  . Miconazole Nitrate 2 % POWD Apply to skin folds twice a day  . Misc Natural Products (OSTEO BI-FLEX ADV JOINT SHIELD PO) Take by mouth 2 (two) times daily.   . modafinil (PROVIGIL) 100 MG tablet Take 1 tablet (100 mg total) by mouth daily.   . montelukast (SINGULAIR) 10 MG tablet Take 1 tablet (10 mg total) by mouth at bedtime.  . mupirocin ointment (BACTROBAN) 2 % Apply 1 application topically 2 (two) times daily.  Marland Kitchen omeprazole (PRILOSEC) 40 MG capsule Take 1 capsule by mouth daily.  . Potassium 99 MG TABS Take by mouth daily at 6 (six) AM.  . pregabalin (LYRICA) 300 MG capsule Take 300 mg by mouth 2 (two) times a day.  . Probiotic Product (PROBIOTIC-10 PO) Take by mouth.  Daryll Brod INSULIN SYRINGE 1ML/31G 31G X 5/16" 1 ML MISC   . rosuvastatin (CRESTOR) 20 MG tablet Take 1 tablet (20 mg total) by mouth daily.  . sertraline (ZOLOFT) 100 MG tablet Take 1.5 tablets (150 mg total) by mouth daily.  Marland Kitchen tiZANidine (ZANAFLEX) 4 MG tablet Take 8 mg by mouth daily with supper.  . traZODone (DESYREL) 50 MG tablet Take 1 tablet (50 mg total) by mouth at bedtime as needed for sleep.  . vitamin C (ASCORBIC ACID) 500 MG tablet Take 500 mg by mouth 2 (two) times daily.   Marland Kitchen VITAMIN D, CHOLECALCIFEROL, PO Take 1,000 Units by mouth daily.      Review of Systems  All other systems reviewed and are otherwise negative except as noted above.  Physical Exam    VS:  BP (!) 120/50 (BP Location: Right Arm, Patient Position: Sitting, Cuff Size: Large)   Pulse 72   Ht 5' 2" (1.575 m)   Wt 241 lb 4 oz (109.4 kg)   SpO2 98%   BMI 44.13 kg/m  , BMI Body mass index is 44.13 kg/m.  Wt Readings from Last 3 Encounters:  08/27/20 241 lb 4 oz (109.4 kg)  07/27/20 242 lb (109.8 kg)  07/13/20 242 lb (109.8 kg)    GEN: Well nourished, overweight, well developed, in no acute distress. HEENT: normal. Neck: Supple, no JVD, carotid bruits, or masses. Cardiac: RRR, no murmurs, rubs, or gallops. No clubbing, cyanosis, edema.  Radials/DP/PT 2+ and equal bilaterally.  Respiratory:  Respirations regular and unlabored, clear to auscultation bilaterally. GI: Soft, nontender, nondistended. MS: No deformity or atrophy. Skin: Warm and  dry, no rash. Neuro:   Strength and sensation are intact. Psych: Normal affect.  Assessment & Plan    1. DOE- Chronic and likely etiology morbid obesity. Echo and PET/CT stress test 03/2019 were reassuring. Reports symptoms are stable at her baseline. No indication for further evaluation at this time.   2. HTN- BP well controlled. Continue current antihypertensive regimen.   3. Mild LVH / Heart size upper normal by CXR - Stable dating back to 2019. Likely due to her history of HTN. BP at goal of <130/80. No indication for further evaluation. Reassurance provided. No evidence of heart failure exacerbation or worsening LVEF.  4. Morbid obesity- Weight loss via diet and exercise encouraged. No cardiac contraindication for proceeding with bariatric surgery.  5. HLD, LDL cholesterol 100- Continue Rosuvastatin and Vascepa.   Disposition: Follow up in 1 year(s) with Dr. Saunders Revel or APP   Signed, Loel Dubonnet, NP 08/27/2020, 10:52 AM [place]

## 2020-08-27 NOTE — Patient Instructions (Signed)
Medication Instructions:  No medication changes today.   *If you need a refill on your cardiac medications before your next appointment, please call your pharmacy*   Lab Work: None ordered today.   Testing/Procedures: Your EKG today showed normal sinus rhythm. It did show a single PVC which is an early heart beat that is very common and not dangerous. Your metoprolol helps prevent these.   Your chest x-ray at the urgent care showed your heart was at the upper normal of size. This is stable compared to your chest xray in 2019. We know from your ultrasound of your heart in 2020 that your heart muscle is just mildly thick. This is likely why your heart is at the upper normal size. Keeping your blood pressure controlled and staying active will help prevent this from progressing.    Follow-Up: At [facility], you and your health needs are our priority.  As part of our continuing mission to provide you with exceptional heart care, we have created designated Provider Care Teams.  These Care Teams include your primary Cardiologist (physician) and Advanced Practice Providers (APPs -  Physician Assistants and Nurse Practitioners) who all work together to provide you with the care you need, when you need it.  We recommend signing up for the patient portal called "MyChart".  Sign up information is provided on this After Visit Summary.  MyChart is used to connect with patients for Virtual Visits (Telemedicine).  Patients are able to view lab/test results, encounter notes, upcoming appointments, etc.  Non-urgent messages can be sent to your provider as well.   To learn more about what you can do with MyChart, go to [email or web address].    Your next appointment:   1 year(s)  The format for your next appointment:   In Person  Provider:   You may see [name], MD or one of the following Advanced Practice Providers on your designated Care Team:    Murray Hodgkins, NP  Christell Faith,  PA-C  Marrianne Mood, PA-C  Cadence Kathlen Mody, [place]  Laurann Montana, NP  Other Instructions  Try to keep your feet up when sitting to prevent swelling.

## 2020-08-30 ENCOUNTER — Telehealth: Payer: Self-pay

## 2020-08-30 NOTE — Telephone Encounter (Signed)
  Chronic Care Management   Outreach Note  08/30/2020 Name: Summer [name] MRN: [id number] DOB: [birth year]  Primary Care Provider: Steele Sizer, MD Reason for referral : Chronic Care Management   A second unsuccessful telephone outreach was attempted today. [name] was referred to the case management team for assistance with care management and care coordination.    Follow Up Plan:  A HIPAA compliant voice message was left today requesting a return call.    [name] Care Management [facility] [phone]

## 2020-08-31 ENCOUNTER — Telehealth: Payer: Self-pay

## 2020-08-31 ENCOUNTER — Other Ambulatory Visit: Payer: Self-pay

## 2020-08-31 ENCOUNTER — Ambulatory Visit (INDEPENDENT_AMBULATORY_CARE_PROVIDER_SITE_OTHER): Payer: Medicare HMO

## 2020-08-31 ENCOUNTER — Ambulatory Visit: Payer: Medicare HMO | Admitting: Podiatry

## 2020-08-31 DIAGNOSIS — M86072 Acute hematogenous osteomyelitis, left ankle and foot: Secondary | ICD-10-CM

## 2020-08-31 DIAGNOSIS — L97522 Non-pressure chronic ulcer of other part of left foot with fat layer exposed: Secondary | ICD-10-CM | POA: Diagnosis not present

## 2020-08-31 DIAGNOSIS — E0843 Diabetes mellitus due to underlying condition with diabetic autonomic (poly)neuropathy: Secondary | ICD-10-CM | POA: Diagnosis not present

## 2020-08-31 MED ORDER — DOXYCYCLINE HYCLATE 100 MG PO TABS: 100.0000 mg | ORAL_TABLET | Freq: Two times a day (BID) | ORAL | 0 refills | Status: DC

## 2020-08-31 NOTE — Progress Notes (Signed)
Subjective:  69 y.o. female with PMHx of diabetes mellitus presenting to the office today for new complaint regarding an ulcer that developed to the left fifth toe area.  Patient does have history of Charcot reconstruction left foot approximately 4-5 years ago so she states.  She states that this ulcer developed approximately 1-2 weeks ago.  She states also that her diabetes is well under control.  She has no pain associated to the area because of her neuropathy.   Apparently the patient is completely unaware today that she is missing her left fifth toe. She presents for further treatment and evaluation   Past Medical History:  Diagnosis Date  . Anemia   . Arthritis   . Asthma   . B12 deficiency   . Back pain   . Carpal tunnel syndrome   . Chronic kidney disease   . Constipation   . Depression   . Depressive disorder   . Diabetes mellitus   . Dyspnea   . Fluid retention   . Foot pain   . GERD (gastroesophageal reflux disease)   . Headache   . History of hiatal hernia   . Hyperlipidemia   . Hypertension   . IBS (irritable bowel syndrome)   . Insomnia   . Joint pain   . Lumbago   . Memory loss   . Obesity   . 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)   . Pneumonia   . Rhinitis, allergic   . Rosacea   . Sleep apnea   . SOB (shortness of breath)   . Thyroid disease   . Unspecified hearing loss   . Unspecified hereditary and idiopathic peripheral neuropathy   . Unspecified sleep apnea   . Vitamin D deficiency         Objective/Physical Exam General: The patient is alert and oriented x3 in no acute distress.  Dermatology:  Wound #1 noted to the left fifth toe amputation area measuring approximately 1.5 x 1.5 x 1.0 cm (LxWxD).   To the noted ulceration(s), there is no eschar. There is an extensive amount of slough, fibrin, and necrotic tissue noted.  Minimal granulation tissue noted.. There is a minimal amount of serosanguineous drainage noted. There is exposed head of the  proximal phalanx which is very necrotic and discolored.  Moderate malodor. Periwound integrity is intact.  There are some mild erythema localized to the wound site Skin is warm, dry and supple bilateral lower extremities.  Vascular: Palpable pedal pulses bilaterally. Capillary refill within normal limits.  Neurological: Epicritic and protective threshold diminished bilaterally.   Musculoskeletal Exam: Range of motion within normal limits to all pedal and ankle joints bilateral. Muscle strength 5/5 in all groups bilateral.   Radiographic exam: Absence of the distal and middle phalanx noted left fifth toe. Proximal phalanx is remaining. No significant gas within the tissues.  Assessment: 1. Atraumatic loss of left fifth toe secondary to diabetes mellitus 2. Ulcer left fifth toe area with necrotic bone exposed  3. Diabetes mellitus w/ peripheral neuropathy   Plan of Care:  1. Patient was evaluated. 2. medically necessary excisional debridement including muscle and deep fascial tissue was performed using a tissue nipper and a chisel blade. Excisional debridement of all the necrotic nonviable tissue down to healthy bleeding viable tissue was performed with post-debridement measurements same as pre-. 3. the wound was cleansed and dry sterile dressing applied. 4. Cultures were taken prior to dressings applied 5. Prescription for doxycycline 100 mg 2 times daily 6. I explained  to the patient that she needs urgent amputation of the remaining portion of toe. Apparently the patient was completely unaware that she was missing a toe to her left foot. She states that approximately 2 weeks ago she believes she picked out a blister in the area. I would assume that this was the portion of toe that is missing upon presentation today. 7. Authorization for surgery was initiated today. Surgery will consist of remaining left fifth toe amputation. Possible partial fifth ray amputation left. Incision and drainage  left. We will try and arrange to have surgery this Thursday, 09/02/2020. 8. Return to clinic 1 week postop    Edrick Kins, DPM [facility]  Dr. Edrick Kins, [address]                                        [place], [place] [address]                Office [phone]  Fax [phone]

## 2020-08-31 NOTE — Telephone Encounter (Signed)
DOS 09/02/2020  AMPUTATION TOE MPJ 5TH LT - 28820 I&D LEFT FOOT - 11061  [employer]  The following codes do not require a pre-authorization Created on 08/31/2020  [facility] Service info 10061 Drainage of multiple abscess 28820 Amputation of foot

## 2020-08-31 NOTE — Addendum Note (Signed)
Addended by: Geni Bers on: 08/31/2020 04:57 PM   Modules accepted: Orders

## 2020-09-02 ENCOUNTER — Telehealth: Payer: Self-pay

## 2020-09-02 DIAGNOSIS — L97522 Non-pressure chronic ulcer of other part of left foot with fat layer exposed: Secondary | ICD-10-CM | POA: Diagnosis not present

## 2020-09-02 DIAGNOSIS — M86672 Other chronic osteomyelitis, left ankle and foot: Secondary | ICD-10-CM | POA: Diagnosis not present

## 2020-09-02 DIAGNOSIS — L02612 Cutaneous abscess of left foot: Secondary | ICD-10-CM | POA: Diagnosis not present

## 2020-09-02 NOTE — Telephone Encounter (Signed)
I returned patient call and left voice mail to call back

## 2020-09-02 NOTE — Telephone Encounter (Signed)
Pt states that she had surgery this morning but she is bleeding really bad and she was going to elevate it within a few moments after we hung up. Advised pt that I would send a message to an assistant who could advise her as I am not able to give out medical advise. Pt was understanding and stated that she would wait for a call back.

## 2020-09-06 ENCOUNTER — Other Ambulatory Visit: Payer: Self-pay

## 2020-09-06 ENCOUNTER — Encounter: Payer: Self-pay | Admitting: Podiatry

## 2020-09-06 ENCOUNTER — Ambulatory Visit (INDEPENDENT_AMBULATORY_CARE_PROVIDER_SITE_OTHER): Payer: Medicare HMO | Admitting: Podiatry

## 2020-09-06 VITALS — BP 119/59 | HR 63 | Temp 99.5°F

## 2020-09-06 DIAGNOSIS — Z9889 Other specified postprocedural states: Secondary | ICD-10-CM

## 2020-09-06 DIAGNOSIS — T8131XA Disruption of external operation (surgical) wound, not elsewhere classified, initial encounter: Secondary | ICD-10-CM

## 2020-09-06 DIAGNOSIS — L97522 Non-pressure chronic ulcer of other part of left foot with fat layer exposed: Secondary | ICD-10-CM

## 2020-09-06 DIAGNOSIS — Z89422 Acquired absence of other left toe(s): Secondary | ICD-10-CM | POA: Diagnosis not present

## 2020-09-06 NOTE — Progress Notes (Signed)
  Subjective:  Patient ID: Summer [name], female    DOB: 04-Sep-1951,  MRN: [id number]  Chief Complaint  Patient presents with  . Post-op Problem    Patient presents today for dressing change.  She states "I have been changing the dressing every day because its soaked with blood"      DOS: 12/23 Procedure: Fifth toe amputation and I&D of left foot  69 y.o. female returns for post-op check.  She had a little bleeding in the dressing and had change the dressing with her boyfriend.  Review of Systems: Negative except as noted in the HPI. Denies N/V/F/Ch.   Objective:   Vitals:   09/06/20 1502  BP: (!) 119/59  Pulse: 63  Temp: 99.5 F (37.5 C)   There is no height or weight on file to calculate BMI. Constitutional Well developed. Well nourished.  Vascular Foot warm and well perfused. Capillary refill normal to all digits.   Neurologic Normal speech. Oriented to person, place, and time. Epicritic sensation to light touch grossly absent bilaterally.  Dermatologic  wound distance as noted in the photo below.  No signs of infection.  Mild periincisional erythema.  No cellulitis no purulence or malodor.  Bloody drainage in dressing  Orthopedic: Tenderness to palpation noted about the surgical site.       Assessment:   1. Post-operative state   2. Ulcer of left foot with fat layer exposed ([place])   3. Dehiscence of operative wound, initial encounter    Plan:  Patient was evaluated and treated and all questions answered.  S/p foot surgery left -She has had a partial wound dehiscence of the anterior portion of the incision.  I compress the incision and coapted it and Steri-Stripped it into place.  Likely will have to heal in secondarily -WB Status: WBAT to heel in surgical shoe.  I advised her to rest as much as possible and keep it iced and elevated -Sutures: We will leave remaining sutures and staples intact until ready to be removed by Dr. Amalia Hailey. -Medications: No refills -Foot  redressed.  Return in about 8 days (around 09/14/2020).

## 2020-09-06 NOTE — Telephone Encounter (Signed)
Patient was seen in office by Dr.Mcdonald for dress change today

## 2020-09-09 ENCOUNTER — Encounter: Payer: Self-pay | Admitting: Podiatry

## 2020-09-13 LAB — AEROBIC CULTURE

## 2020-09-13 LAB — SPECIMEN STATUS REPORT

## 2020-09-14 ENCOUNTER — Ambulatory Visit (INDEPENDENT_AMBULATORY_CARE_PROVIDER_SITE_OTHER): Payer: Medicare HMO | Admitting: Podiatry

## 2020-09-14 ENCOUNTER — Other Ambulatory Visit: Payer: Self-pay

## 2020-09-14 ENCOUNTER — Ambulatory Visit (INDEPENDENT_AMBULATORY_CARE_PROVIDER_SITE_OTHER): Payer: Medicare HMO

## 2020-09-14 DIAGNOSIS — Z9889 Other specified postprocedural states: Secondary | ICD-10-CM

## 2020-09-14 DIAGNOSIS — L97522 Non-pressure chronic ulcer of other part of left foot with fat layer exposed: Secondary | ICD-10-CM

## 2020-09-14 DIAGNOSIS — M86072 Acute hematogenous osteomyelitis, left ankle and foot: Secondary | ICD-10-CM

## 2020-09-14 DIAGNOSIS — E1142 Type 2 diabetes mellitus with diabetic polyneuropathy: Secondary | ICD-10-CM

## 2020-09-14 MED ORDER — SULFAMETHOXAZOLE-TRIMETHOPRIM 800-160 MG PO TABS: 1.0000 | ORAL_TABLET | Freq: Two times a day (BID) | ORAL | 0 refills | Status: DC

## 2020-09-14 MED ORDER — CIPROFLOXACIN HCL 500 MG PO TABS: 500.0000 mg | ORAL_TABLET | Freq: Two times a day (BID) | ORAL | 0 refills | Status: DC

## 2020-09-15 ENCOUNTER — Ambulatory Visit: Payer: Self-pay

## 2020-09-15 NOTE — Chronic Care Management (AMB) (Signed)
  Chronic Care Management   Outreach Note  09/15/2020 Name: LAURI PURDUM MRN: [id number] DOB: Dec 19, 1950  Primary Care Provider: Alba Cory, MD Reason for referral : Chronic Care Management   Ms. Wahba  was referred to the care management team for assistance with chronic care management and care coordination. Her primary care provider will be notified of our unsuccessful attempts to establish contact. The care management team will gladly outreach at any time in the future if she is interested in receiving assistance.    PLAN The care management team will gladly assist Ms. Bossler after the primary care provider has a conversation with her regarding recommendation for care management engagement and subsequent re-referral for care management services.     [facility] Care Management [facility] [phone]

## 2020-09-17 NOTE — Progress Notes (Signed)
   Subjective:  Patient presents today status post partial fifth ray amputation left foot. DOS: 09/02/2020.  Patient last seen in the office on 09/06/2020.  Patient doing well.  She changes dressing daily.  She has no pain to the area.  No new complaints at this time  Past Medical History:  Diagnosis Date  . Anemia   . Arthritis   . Asthma   . B12 deficiency   . Back pain   . Carpal tunnel syndrome   . Chronic kidney disease   . Constipation   . Depression   . Depressive disorder   . Diabetes mellitus   . Dyspnea   . Fluid retention   . Foot pain   . GERD (gastroesophageal reflux disease)   . Headache   . History of hiatal hernia   . Hyperlipidemia   . Hypertension   . IBS (irritable bowel syndrome)   . Insomnia   . Joint pain   . Lumbago   . Memory loss   . Obesity   . 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)   . Pneumonia   . Rhinitis, allergic   . Rosacea   . Sleep apnea   . SOB (shortness of breath)   . Thyroid disease   . Unspecified hearing loss   . Unspecified hereditary and idiopathic peripheral neuropathy   . Unspecified sleep apnea   . Vitamin D deficiency    Objective: Physical Exam General: The patient is alert and oriented x3 in no acute distress.  Dermatology: Dehiscence noted to the distal portion of the incision site.  Periincisional maceration with some erythema also noted.  No malodor noted.  Moderate drainage noted coming from the incision site.  Sutures and staples remain intact.  Vascular: Palpable pedal pulses bilaterally.  Moderate edema with erythema noted to the foot. Capillary refill within normal limits.  Neurological: Epicritic and protective threshold grossly intact bilaterally.   Musculoskeletal Exam: All pedal and ankle joints range of motion within normal limits bilateral. Muscle strength 5/5 in all groups bilateral.    Radiographic Exam:  Osteotomy at the base of the fifth metatarsal appears clean.  No cortical destruction or osseous  erosion noted.  Assessment: 1. s/p partial fifth ray amputation left foot. DOS: 09/02/2020   Plan of Care:  1. Patient was evaluated. X-rays reviewed 2.  Cultures taken and sent to pathology 3.  Prescription for Bactrim DS and Cipro 500 mg 2 times daily 4.  Dressings changed.  Keep clean dry and intact x1 week 5.  Return to clinic in 1 week.  I will see the patient weekly until further stabilized and improved   Edrick Kins, DPM [facility]  Dr. Edrick Kins, DPM    [address], [place] [address]                Office [phone]  Fax [phone]

## 2020-09-21 ENCOUNTER — Other Ambulatory Visit: Payer: Self-pay

## 2020-09-21 ENCOUNTER — Telehealth: Payer: Self-pay | Admitting: Podiatry

## 2020-09-21 ENCOUNTER — Ambulatory Visit (INDEPENDENT_AMBULATORY_CARE_PROVIDER_SITE_OTHER): Payer: Medicare HMO | Admitting: Podiatry

## 2020-09-21 DIAGNOSIS — L97522 Non-pressure chronic ulcer of other part of left foot with fat layer exposed: Secondary | ICD-10-CM

## 2020-09-21 DIAGNOSIS — M86072 Acute hematogenous osteomyelitis, left ankle and foot: Secondary | ICD-10-CM

## 2020-09-21 DIAGNOSIS — T8131XA Disruption of external operation (surgical) wound, not elsewhere classified, initial encounter: Secondary | ICD-10-CM

## 2020-09-21 DIAGNOSIS — Z9889 Other specified postprocedural states: Secondary | ICD-10-CM

## 2020-09-21 NOTE — Telephone Encounter (Signed)
[facility] called in regarding patient order, requesting prior authorization for facility. Rep Lenna Sciara) stated its required and its not on file, Please Advise   Melissa [phone] Ext [phone]

## 2020-09-21 NOTE — Progress Notes (Signed)
   Subjective:  Patient presents today status post partial fifth ray amputation left foot. DOS: 09/02/2020.  Patient states that she has been changing the dressings daily.  No pain to the area.  No new complaints at this time.  She states that she has an MRI scheduled for 09/23/2020  Past Medical History:  Diagnosis Date  . Anemia   . Arthritis   . Asthma   . B12 deficiency   . Back pain   . Carpal tunnel syndrome   . Chronic kidney disease   . Constipation   . Depression   . Depressive disorder   . Diabetes mellitus   . Dyspnea   . Fluid retention   . Foot pain   . GERD (gastroesophageal reflux disease)   . Headache   . History of hiatal hernia   . Hyperlipidemia   . Hypertension   . IBS (irritable bowel syndrome)   . Insomnia   . Joint pain   . Lumbago   . Memory loss   . Obesity   . 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)   . Pneumonia   . Rhinitis, allergic   . Rosacea   . Sleep apnea   . SOB (shortness of breath)   . Thyroid disease   . Unspecified hearing loss   . Unspecified hereditary and idiopathic peripheral neuropathy   . Unspecified sleep apnea   . Vitamin D deficiency    Objective: Physical Exam General: The patient is alert and oriented x3 in no acute distress.  Dermatology: Dehiscence noted to the distal portion of the incision site.  Periincisional maceration with some erythema also noted.  No malodor noted.  Moderate drainage noted coming from the incision site.  Sutures and staples remain intact.  Vascular: Palpable pedal pulses bilaterally.  Moderate edema with erythema noted to the foot. Capillary refill within normal limits.  Neurological: Epicritic and protective threshold grossly intact bilaterally.   Musculoskeletal Exam: All pedal and ankle joints range of motion within normal limits bilateral. Muscle strength 5/5 in all groups bilateral.   Assessment: 1. s/p partial fifth ray amputation left foot. DOS: 09/02/2020   Plan of Care:  1. Patient  was evaluated.  Sutures and stainless steel skin staples were removed today. 2.  Cultures reviewed today 3.  Continue oral antibiotics as prescribed 4.  Continue daily dressing changes 5.  Continue wearing postsurgical shoe 6.  Return to clinic in 1 week to review MRI results and discuss further treatment options  Edrick Kins, DPM [facility]  Dr. Edrick Kins, DPM    [address], [place] [address]                Office [phone]  Fax [phone]

## 2020-09-22 ENCOUNTER — Telehealth: Payer: Self-pay | Admitting: *Deleted

## 2020-09-22 DIAGNOSIS — M86072 Acute hematogenous osteomyelitis, left ankle and foot: Secondary | ICD-10-CM

## 2020-09-22 NOTE — Telephone Encounter (Signed)
Patient is wanting to know why her MRI was cancelled for 09/23/20.    Called and informed patient that the MRI was cancelled and replaced w/ CT scan(noted in comment section of CT order) because of her rearfoot hardware and image refraction. She verbalized understanding.

## 2020-09-23 ENCOUNTER — Ambulatory Visit: Payer: Medicare HMO

## 2020-09-23 ENCOUNTER — Other Ambulatory Visit: Payer: Self-pay | Admitting: Podiatry

## 2020-09-23 DIAGNOSIS — M86072 Acute hematogenous osteomyelitis, left ankle and foot: Secondary | ICD-10-CM

## 2020-09-23 NOTE — Addendum Note (Signed)
Addended by: Graceann Congress D on: 09/23/2020 08:32 AM   Modules accepted: Orders

## 2020-09-23 NOTE — Telephone Encounter (Signed)
CT has been approved per [facility] from 09/14/2020 to 10/14/2020  Auth# [id number] Patient has been notified and will stop by office to pick up order for labs.

## 2020-09-23 NOTE — Addendum Note (Signed)
Addended by: Graceann Congress D on: 09/23/2020 08:27 AM   Modules accepted: Orders

## 2020-09-24 ENCOUNTER — Encounter: Payer: Self-pay | Admitting: Family Medicine

## 2020-09-24 ENCOUNTER — Other Ambulatory Visit: Payer: Self-pay

## 2020-09-24 ENCOUNTER — Ambulatory Visit (INDEPENDENT_AMBULATORY_CARE_PROVIDER_SITE_OTHER): Payer: Medicare HMO | Admitting: Family Medicine

## 2020-09-24 VITALS — BP 124/78 | HR 82 | Temp 97.8°F | Resp 18 | Ht 61.0 in | Wt 240.8 lb

## 2020-09-24 DIAGNOSIS — E785 Hyperlipidemia, unspecified: Secondary | ICD-10-CM

## 2020-09-24 DIAGNOSIS — F331 Major depressive disorder, recurrent, moderate: Secondary | ICD-10-CM

## 2020-09-24 DIAGNOSIS — I10 Essential (primary) hypertension: Secondary | ICD-10-CM

## 2020-09-24 DIAGNOSIS — E1142 Type 2 diabetes mellitus with diabetic polyneuropathy: Secondary | ICD-10-CM

## 2020-09-24 DIAGNOSIS — M48062 Spinal stenosis, lumbar region with neurogenic claudication: Secondary | ICD-10-CM

## 2020-09-24 DIAGNOSIS — M170 Bilateral primary osteoarthritis of knee: Secondary | ICD-10-CM | POA: Diagnosis not present

## 2020-09-24 DIAGNOSIS — N1832 Chronic kidney disease, stage 3b: Secondary | ICD-10-CM | POA: Diagnosis not present

## 2020-09-24 DIAGNOSIS — E1169 Type 2 diabetes mellitus with other specified complication: Secondary | ICD-10-CM

## 2020-09-24 DIAGNOSIS — Z6841 Body Mass Index (BMI) 40.0 and over, adult: Secondary | ICD-10-CM

## 2020-09-24 DIAGNOSIS — G4733 Obstructive sleep apnea (adult) (pediatric): Secondary | ICD-10-CM

## 2020-09-24 DIAGNOSIS — F99 Mental disorder, not otherwise specified: Secondary | ICD-10-CM

## 2020-09-24 DIAGNOSIS — E039 Hypothyroidism, unspecified: Secondary | ICD-10-CM | POA: Diagnosis not present

## 2020-09-24 DIAGNOSIS — I700 Atherosclerosis of aorta: Secondary | ICD-10-CM

## 2020-09-24 DIAGNOSIS — F5105 Insomnia due to other mental disorder: Secondary | ICD-10-CM

## 2020-09-24 DIAGNOSIS — M86072 Acute hematogenous osteomyelitis, left ankle and foot: Secondary | ICD-10-CM | POA: Diagnosis not present

## 2020-09-24 MED ORDER — METOPROLOL TARTRATE 25 MG PO TABS
25.0000 mg | ORAL_TABLET | Freq: Two times a day (BID) | ORAL | 1 refills | Status: DC
Start: 2020-09-24 — End: 2021-01-28

## 2020-09-24 MED ORDER — MODAFINIL 100 MG PO TABS: 100.0000 mg | ORAL_TABLET | Freq: Every day | ORAL | 0 refills | Status: DC

## 2020-09-24 MED ORDER — AMLODIPINE BESYLATE 5 MG PO TABS: 5.0000 mg | ORAL_TABLET | Freq: Every day | ORAL | 1 refills | Status: DC

## 2020-09-24 NOTE — Progress Notes (Signed)
Name: Summer [name]   MRN: [id number]    DOB: Apr 23, 1951   Date:09/24/2020       Progress Note  Subjective  Chief Complaint  Follow Up  HPI   Hypothyroidism: she states hair loss is stable, no dysphagia, no change in bowel movements. On levothyroxine 25 mg daily and one and two on Sundays , last level was high, we will recheck TSH today   Morbid Obesity: she is doing better , weight is down  9 lbs since last visit in October. She has been going to health and wellness center in [place] and decided not to pursue bariatric surgery at this time.   MDD: she is currently taking Zoloft 150 mg daily, off duloxetine , but taking  modafinil and has noticed increase in energy but denied by insurance, she will try using [facility] . She is seeing psychiatrist now   Atherosclerosis of aorta: found on CT abdomen, discussed importance of continues statin therapy , she states not taking lovaza or Vascepa due to cost. She cannot afford Repatha for the same reason   Diabetes : she is under the care of Endocrinologist, Dr. Gershon Crane , last A1C was 6.7% on 05/13/2020. She has dyslipidemia, obesity, CKI, and HTN associated with DM.She is compliant with medication , eye exam is up to date, she had 5th toe amputated due to neuropathy and large foot ulcer and osteomyelitis.   History of left 5th toe amputation for treatment of diabetic foot ulcer and osteomyelitis . Last visit with Dr. Logan Bores was 09/21/2020  CKI stage III: GFR is stable, she is on ARB, bp is controlled, seeing nephrologist - Dr. Cherylann Ratel. She has good urine output and no pruritis  Last GFR back to baseline in the 40's, she is taking metformin again   Dyslipidemia: had labs done 05/13/2020 HDL is slightly better, LDL still above goal at 117 , triglycerides was high , she stopped taking fish oil due to cost, cannot afford Repatha either   OSA: she continues to wake up feeling tired, we started Mondafinil this year and it has helped with energy , she  is still not wearing CPAP machine, unable to tolerate it . Trazodone is no longer helping her stay asleep, advised to discuss it with her psychiatrist.   HTN: bp at home has been around 130's/80's, no chest pain or palpitation, she has SOB with activity but stable. Seeing Dr. Okey Dupre for SOB, likely multifactorial . BP today also at goal   OA: she has long history of bilateral knee pain, she has difficulty walking, standing, using a roller walker  to help with ambulation, no effusion but pops and hurts,, states right knee is getting worse. She stopped going to PT because it was causing more pain   DDD lumbar spine: under the care of Dr. Adriana Simas  Patient Active Problem List   Diagnosis Date Noted  . Other fatigue 05/24/2020  . Diabetes mellitus ([name]) 05/24/2020  . B12 deficiency 05/24/2020  . Vitamin D deficiency 05/24/2020  . Metatarsalgia of left foot 01/15/2020  . Preop cardiovascular exam 07/18/2019  . Major depressive disorder, recurrent episode, moderate ([name]) 04/04/2019  . Left arm weakness 04/03/2019  . (HFpEF) heart failure with preserved ejection fraction ([name]) 08/31/2017  . Moderate persistent asthma 08/31/2017  . Hyperlipidemia associated with type 2 diabetes mellitus ([name]) 06/14/2017  . Dyslipidemia associated with type 2 diabetes mellitus ([name]) 08/18/2016  . Charcot foot due to diabetes mellitus ([name]) 02/22/2016  . Acquired abduction deformity of  foot 07/12/2015  . Osteoarthritis of subtalar joint 07/12/2015  . Poorly controlled type 2 diabetes mellitus with neuropathy ([name]) 07/12/2015  . Arthritis of foot, degenerative 07/12/2015  . Carpal tunnel syndrome 04/17/2015  . Chronic constipation 04/17/2015  . Insomnia, persistent 04/17/2015  . Stage 3 chronic kidney disease ([name]) 04/17/2015  . Decreased exercise tolerance 04/17/2015  . Diabetes mellitus with polyneuropathy ([name]) 04/17/2015  . Gastro-esophageal reflux disease without esophagitis 04/17/2015  . Bursitis,  trochanteric 04/17/2015  . Cephalalgia 04/17/2015  . Hypertension associated with diabetes ([name]) 04/17/2015  . Adult hypothyroidism 04/17/2015  . Hearing loss 04/17/2015  . Chronic recurrent major depressive disorder ([name]) 04/17/2015  . Neurogenic claudication ([name]) 04/17/2015  . Class 3 severe obesity with serious comorbidity and body mass index (BMI) of 45.0 to 49.9 in adult ([name]) 04/17/2015  . Hypo-ovarianism 04/17/2015  . Perennial allergic rhinitis with seasonal variation 04/17/2015  . Acne erythematosa 04/17/2015  . Dyskinesia, tardive 04/17/2015  . Memory loss 04/17/2015  . Impingement syndrome of shoulder 04/17/2015  . Dermatitis, stasis 04/17/2015  . Obstructive sleep apnea 05/14/2014  . Shortness of breath on exertion 05/06/2014  . Mixed hyperlipidemia 02/06/2012  . LBP (low back pain) 09/16/2008    Past Surgical History:  Procedure Laterality Date  . ABDOMINAL HYSTERECTOMY  1975  . ANKLE SURGERY Left approx Jan 2018  . APPENDECTOMY  1970  . CATARACT EXTRACTION  01/2011   right  . COLONOSCOPY WITH PROPOFOL N/A 08/19/2019   Procedure: COLONOSCOPY WITH PROPOFOL;  Surgeon: Wyline Mood, MD;  Location: [facility] ENDOSCOPY;  Service: Gastroenterology;  Laterality: N/A;  . eye lid surgery  2013   bilateral  . FOOT SURGERY    . NECK SURGERY    . SPINE SURGERY    . TUBAL LIGATION    . VAGINAL HYSTERECTOMY  1989    Family History  Problem Relation Age of Onset  . Aneurysm Mother   . Aortic aneurysm Mother   . Hypertension Mother   . Hyperlipidemia Mother   . Heart disease Mother   . Obesity Mother   . Heart attack Maternal Grandfather   . Diabetes Maternal Grandfather     Social History   Tobacco Use  . Smoking status: Never Smoker  . Smokeless tobacco: Never Used  Substance Use Topics  . Alcohol use: No    Alcohol/week: 0.0 standard drinks     Current Outpatient Medications:  .  ACCU-CHEK SOFTCLIX LANCETS lancets, , Disp: , Rfl:  .  acetaminophen (TYLENOL 8  HOUR) 650 MG CR tablet, Take 1 tablet (650 mg total) by mouth every 8 (eight) hours as needed for pain., Disp: 90 tablet, Rfl: 2 .  albuterol (PROVENTIL) (2.5 MG/3ML) 0.083% nebulizer solution, Take 3 mLs (2.5 mg total) by nebulization every 6 (six) hours as needed., Disp: 75 mL, Rfl: 2 .  albuterol (VENTOLIN HFA) 108 (90 Base) MCG/ACT inhaler, every 6 (six) hours as needed., Disp: , Rfl:  .  Alcohol Swabs (B-D SINGLE USE SWABS REGULAR) PADS, , Disp: , Rfl:  .  ASPIRIN LOW DOSE 81 MG EC tablet, TAKE 1 TABLET (81 MG TOTAL) BY MOUTH DAILY., Disp: 30 tablet, Rfl: 0 .  Blood Glucose Monitoring Suppl (ACCU-CHEK AVIVA PLUS) w/Device KIT, , Disp: , Rfl:  .  buPROPion (WELLBUTRIN XL) 150 MG 24 hr tablet, Take 1 tablet by mouth daily., Disp: 90 tablet, Rfl: 0 .  Calcium Carb-Cholecalciferol (CALCIUM 600+D3 PO), Take by mouth., Disp: , Rfl:  .  ciprofloxacin (CIPRO) 500 MG tablet,  Take 1 tablet (500 mg total) by mouth 2 (two) times daily., Disp: 20 tablet, Rfl: 0 .  Cyanocobalamin (VITAMIN B12 PO), Take 1 tablet by mouth daily. , Disp: , Rfl:  .  furosemide (LASIX) 20 MG tablet, Take 1 tablet (20 mg total) by mouth daily., Disp: 90 tablet, Rfl: 1 .  insulin NPH-regular Human (70-30) 100 UNIT/ML injection, Inject 100 Units into the skin 2 (two) times daily with a meal., Disp: , Rfl:  .  irbesartan-hydrochlorothiazide (AVALIDE) 300-12.5 MG tablet, Take 1 tablet by mouth daily., Disp: 90 tablet, Rfl: 1 .  levothyroxine (SYNTHROID) 25 MCG tablet, Take 1 tablet by mouth daily except, take 1 and 1/2 tablets by mouth on Sunday., Disp: 100 tablet, Rfl: 0 .  metFORMIN (GLUCOPHAGE) 500 MG tablet, Take 500 mg by mouth daily., Disp: , Rfl:  .  metroNIDAZOLE (METROCREAM) 0.75 % cream, Apply topically in the morning and at bedtime., Disp: 45 g, Rfl: 0 .  Miconazole Nitrate 2 % POWD, Apply to skin folds twice a day, Disp: 100 g, Rfl: 0 .  Misc Natural Products (OSTEO BI-FLEX ADV JOINT SHIELD PO), Take by mouth 2 (two) times  daily. , Disp: , Rfl:  .  montelukast (SINGULAIR) 10 MG tablet, Take 1 tablet (10 mg total) by mouth at bedtime., Disp: 90 tablet, Rfl: 1 .  omeprazole (PRILOSEC) 40 MG capsule, Take 1 capsule by mouth daily., Disp: 90 capsule, Rfl: 0 .  Potassium 99 MG TABS, Take by mouth daily at 6 (six) AM., Disp: , Rfl:  .  pregabalin (LYRICA) 300 MG capsule, Take 300 mg by mouth 2 (two) times a day., Disp: , Rfl:  .  Probiotic Product (PROBIOTIC-10 PO), Take by mouth., Disp: , Rfl:  .  RELION INSULIN SYRINGE 1ML/31G 31G X 5/16" 1 ML MISC, , Disp: , Rfl:  .  rosuvastatin (CRESTOR) 20 MG tablet, Take 1 tablet (20 mg total) by mouth daily., Disp: 90 tablet, Rfl: 1 .  sertraline (ZOLOFT) 100 MG tablet, Take 1.5 tablets (150 mg total) by mouth daily., Disp: 135 tablet, Rfl: 0 .  tiZANidine (ZANAFLEX) 4 MG tablet, Take 8 mg by mouth daily with supper., Disp: , Rfl:  .  traZODone (DESYREL) 50 MG tablet, Take 1 tablet (50 mg total) by mouth at bedtime as needed for sleep., Disp: 90 tablet, Rfl: 1 .  vitamin C (ASCORBIC ACID) 500 MG tablet, Take 500 mg by mouth 2 (two) times daily. , Disp: , Rfl:  .  VITAMIN D, CHOLECALCIFEROL, PO, Take 1,000 Units by mouth daily. , Disp: , Rfl:  .  amLODipine (NORVASC) 5 MG tablet, Take 1 tablet (5 mg total) by mouth daily., Disp: 90 tablet, Rfl: 1 .  metoprolol tartrate (LOPRESSOR) 25 MG tablet, Take 1 tablet (25 mg total) by mouth 2 (two) times daily., Disp: 180 tablet, Rfl: 1 .  modafinil (PROVIGIL) 100 MG tablet, Take 1 tablet (100 mg total) by mouth daily., Disp: 90 tablet, Rfl: 0  Allergies  Allergen Reactions  . Codeine Other (See Comments)    "TRIPPED OUT"  DIDN'T LIKE THE [place]  . Atorvastatin     muscle pain  . Hydrocodone     itching  . Tramadol   . Latex Rash  . Zolpidem Other (See Comments)    Sleep walk    I personally reviewed active problem list, medication list, allergies, family history, social history, health maintenance with the patient/caregiver  today.   ROS  Constitutional: Negative for fever ,  positive for  weight change.  Respiratory:  positive for nocturnal cough,   shortness of breath - stable    Cardiovascular: Negative for chest pain or palpitations.  Gastrointestinal: Negative for abdominal pain, no bowel changes.  Musculoskeletal: positive  for gait problem , using walker and has ortho shoe on left foot  Neurological: Negative for dizziness or headache.  No other specific complaints in a complete review of systems (except as listed in HPI above).  Objective  Vitals:   09/24/20 1416  BP: 124/78  Pulse: 82  Resp: 18  Temp: 97.8 F (36.6 C)  SpO2: 97%  Weight: 240 lb 12.8 oz (109.2 kg)  Height: $Remove'5\' 1"'wJiMbuJ$  (1.549 m)    Body mass index is 45.5 kg/m.  Physical Exam  Constitutional: Patient appears well-developed and well-nourished. Obese  No distress.  HEENT: head atraumatic, normocephalic, pupils equal and reactive to light, neck supple Cardiovascular: Normal rate, regular rhythm and normal heart sounds.  No murmur heard. No BLE edema. Pulmonary/Chest: Effort normal and breath sounds normal. No respiratory distress. Abdominal: Soft.  There is no tenderness. Psychiatric: Patient has a normal mood and affect. behavior is normal. Judgment and thought content normal.  Recent Results (from the past 2160 hour(s))  HM DIABETES EYE EXAM     Status: None   Collection Time: 07/07/20 12:00 AM  Result Value Ref Range   HM Diabetic Eye Exam No Retinopathy No Retinopathy    Comment: [place]  Aerobic culture     Status: Abnormal   Collection Time: 08/31/20 12:00 AM  Result Value Ref Range   Aerobic Bacterial Culture Final report (A)    Organism ID, Bacteria Staphylococcus aureus (A)     Comment: Based on susceptibility to oxacillin this isolate would be susceptible to: *Penicillinase-stable penicillins, such as:   Cloxacillin, Dicloxacillin, Nafcillin *Beta-lactam combination agents, such as:    Amoxicillin-clavulanic acid, Ampicillin-sulbactam,   Piperacillin-tazobactam *Oral cephems, such as:   Cefaclor, Cefdinir, Cefpodoxime, Cefprozil, Cefuroxime,   Cephalexin, Loracarbef *Parenteral cephems, such as:   Cefazolin, Cefepime, Cefotaxime, Cefotetan, Ceftaroline,   Ceftizoxime, Ceftriaxone, Cefuroxime *Carbapenems, such as:   Doripenem, Ertapenem, Imipenem, Meropenem Heavy growth    Antimicrobial Susceptibility Comment     Comment:       ** S = Susceptible; I = Intermediate; R = Resistant **                    P = Positive; N = Negative             MICS are expressed in micrograms per mL    Antibiotic                 RSLT#1    RSLT#2    RSLT#3    RSLT#4 Ciprofloxacin                  S Clindamycin                    S Erythromycin                   R Gentamicin                     S Levofloxacin                   S Linezolid  S Moxifloxacin                   S Oxacillin                      S Penicillin                     R Quinupristin/Dalfopristin      S Rifampin                       S Tetracycline                   S Trimethoprim/Sulfa             S Vancomycin                     S   Specimen status report     Status: None   Collection Time: 08/31/20 12:00 AM  Result Value Ref Range   specimen status report Comment     Comment: Test Code Change Test Code Change Please note that the Microbiology test code was changed to reflect the specimen source or transport received.      PHQ2/9: Depression screen [facility] 2/9 09/24/2020 08/26/2020 08/17/2020 06/18/2020 05/13/2020  Decreased Interest 3 0 0 0 3  Down, Depressed, Hopeless 3 0 0 3 3  PHQ - 2 Score 6 0 0 3 6  Altered sleeping 2 - $R'3 2 1  'Va$ Tired, decreased energy 3 - $R'1 3 3  'PL$ Change in appetite 2 - 3 0 2  Feeling bad or failure about yourself  2 - 0 0 1  Trouble concentrating 0 - 0 0 1  Moving slowly or fidgety/restless 0 - 1 1 0  Suicidal thoughts 0 - 0 0 0  PHQ-9 Score 15 - $Re'8 9 14  'bcE$ Difficult  doing work/chores Somewhat difficult - Not difficult at all Somewhat difficult Somewhat difficult  Some recent data might be hidden    phq 9 is positive   Fall Risk: Fall Risk  09/24/2020 08/26/2020 08/17/2020 06/18/2020 03/12/2020  Falls in the past year? 0 - 0 0 0  Comment - - - - -  Number falls in past yr: 0 0 0 0 0  Injury with Fall? 0 0 0 0 0  Risk Factor Category  - - - - -  Comment - - - - -  Risk for fall due to : - Impaired balance/gait - - Impaired balance/gait;Impaired mobility  Risk for fall due to: Comment - - - - -  Follow up - Falls prevention discussed - - -     Functional Status Survey: Is the patient deaf or have difficulty hearing?: No Does the patient have difficulty seeing, even when wearing glasses/contacts?: Yes Does the patient have difficulty concentrating, remembering, or making decisions?: No Does the patient have difficulty walking or climbing stairs?: Yes Does the patient have difficulty dressing or bathing?: No Does the patient have difficulty doing errands alone such as visiting a doctor's office or shopping?: No   Assessment & Plan  1. Dyslipidemia associated with type 2 diabetes mellitus ([place])  On statin therapy   2. Morbid obesity with BMI of 40.0-44.9, adult ([place])  Going to the weight and wellness center and has been losing weight   3. Stage 3b chronic kidney disease ([place])  We will recheck next visit   4. Obstructive sleep apnea  - modafinil (  PROVIGIL) 100 MG tablet; Take 1 tablet (100 mg total) by mouth daily.  Dispense: 90 tablet; Refill: 0  5. Atherosclerosis of aorta ([place])   6. MDD (major depressive disorder), recurrent episode, moderate ([name])  - modafinil (PROVIGIL) 100 MG tablet; Take 1 tablet (100 mg total) by mouth daily.  Dispense: 90 tablet; Refill: 0  7. Adult hypothyroidism  - TSH  8. Primary osteoarthritis of both knees   9. Benign hypertension  - amLODipine (NORVASC) 5 MG tablet; Take 1 tablet (5 mg total) by  mouth daily.  Dispense: 90 tablet; Refill: 1 - metoprolol tartrate (LOPRESSOR) 25 MG tablet; Take 1 tablet (25 mg total) by mouth 2 (two) times daily.  Dispense: 180 tablet; Refill: 1  10. Diabetic polyneuropathy associated with type 2 diabetes mellitus ([place])    11. Spinal stenosis of lumbar region with neurogenic claudication   12. Insomnia due to other mental disorder  Advised to discuss other options with psychiatrist since Trazodone no longer working   13. Essential hypertension  - amLODipine (NORVASC) 5 MG tablet; Take 1 tablet (5 mg total) by mouth daily.  Dispense: 90 tablet; Refill: 1

## 2020-09-25 LAB — COMPREHENSIVE METABOLIC PANEL
ALT: 14 IU/L (ref 0–32)
AST: 15 IU/L (ref 0–40)
Albumin/Globulin Ratio: 1.8 (ref 1.2–2.2)
Albumin: 4.4 g/dL (ref 3.7–4.7)
Alkaline Phosphatase: 100 IU/L (ref 44–121)
BUN/Creatinine Ratio: 23 (ref 12–28)
BUN: 34 mg/dL — ABNORMAL HIGH (ref 8–27)
Bilirubin Total: 0.3 mg/dL (ref 0.0–1.2)
CO2: 24 mmol/L (ref 20–29)
Calcium: 10 mg/dL (ref 8.7–10.3)
Chloride: 101 mmol/L (ref 96–106)
Creatinine, Ser: 1.5 mg/dL — ABNORMAL HIGH (ref 0.57–1.00)
GFR calc Af Amer: 40 mL/min/{1.73_m2} — ABNORMAL LOW (ref >59)
GFR calc non Af Amer: 35 mL/min/{1.73_m2} — ABNORMAL LOW (ref >59)
Globulin, Total: 2.5 g/dL (ref 1.5–4.5)
Glucose: 106 mg/dL — ABNORMAL HIGH (ref 65–99)
Potassium: 4.6 mmol/L (ref 3.5–5.2)
Sodium: 141 mmol/L (ref 134–144)
Total Protein: 6.9 g/dL (ref 6.0–8.5)

## 2020-09-25 LAB — TSH: TSH: 2.27 mIU/L (ref 0.40–4.50)

## 2020-09-27 ENCOUNTER — Other Ambulatory Visit: Payer: Self-pay | Admitting: Family Medicine

## 2020-09-27 ENCOUNTER — Encounter: Payer: Self-pay | Admitting: Family Medicine

## 2020-09-27 DIAGNOSIS — N1832 Chronic kidney disease, stage 3b: Secondary | ICD-10-CM

## 2020-09-27 DIAGNOSIS — F331 Major depressive disorder, recurrent, moderate: Secondary | ICD-10-CM

## 2020-09-27 DIAGNOSIS — J4520 Mild intermittent asthma, uncomplicated: Secondary | ICD-10-CM

## 2020-09-28 ENCOUNTER — Telehealth: Payer: Self-pay | Admitting: Podiatry

## 2020-09-28 NOTE — Telephone Encounter (Signed)
Patient is needing prior authorization for the referral to radiology department, Patient has [facility] and they are requesting prior authorization, patient is scheduled for [facility] 09/30/20, Please Advise

## 2020-09-30 ENCOUNTER — Ambulatory Visit: Payer: Medicare HMO

## 2020-09-30 ENCOUNTER — Ambulatory Visit: Payer: Medicare HMO | Admitting: Dermatology

## 2020-09-30 ENCOUNTER — Telehealth: Payer: Self-pay | Admitting: Podiatry

## 2020-09-30 ENCOUNTER — Other Ambulatory Visit: Payer: Self-pay

## 2020-09-30 DIAGNOSIS — L738 Other specified follicular disorders: Secondary | ICD-10-CM

## 2020-09-30 DIAGNOSIS — L710 Perioral dermatitis: Secondary | ICD-10-CM

## 2020-09-30 DIAGNOSIS — D1800 Hemangioma unspecified site: Secondary | ICD-10-CM

## 2020-09-30 DIAGNOSIS — L820 Inflamed seborrheic keratosis: Secondary | ICD-10-CM | POA: Diagnosis not present

## 2020-09-30 DIAGNOSIS — L918 Other hypertrophic disorders of the skin: Secondary | ICD-10-CM | POA: Diagnosis not present

## 2020-09-30 DIAGNOSIS — L821 Other seborrheic keratosis: Secondary | ICD-10-CM | POA: Diagnosis not present

## 2020-09-30 MED ORDER — DOXYCYCLINE HYCLATE 100 MG PO TABS: ORAL_TABLET | ORAL | 0 refills | Status: DC

## 2020-09-30 NOTE — Telephone Encounter (Signed)
Signed              Patient is in need of authorization for imaging. Someone needs to contact [facility] and change imaging facility to [facility], per preservice rep, Please Advise   Brandy [phone]

## 2020-09-30 NOTE — Progress Notes (Signed)
   Follow-Up Visit   Subjective  Summer [name] is a 70 y.o. female who presents for the following: skin lesions (On the right skin, irritated - patient would like them treated), perioral dermatitis/rosacea (Patient is currently using Skin Medicinals mix BID - well controlled), and Lesion (L cheek - irregular, will not resolve).  The following portions of the chart were reviewed this encounter and updated as appropriate:   Tobacco  Allergies  Meds  Problems  Med Hx  Surg Hx  Fam Hx     Review of Systems:  No other skin or systemic complaints except as noted in HPI or Assessment and Plan.  Objective  Well appearing patient in no apparent distress; mood and affect are within normal limits.  A focused examination was performed including the face and trunk. Relevant physical exam findings are noted in the Assessment and Plan.  Objective  Right Axilla x 9 (9): Fleshy, skin-colored pedunculated papules.    Objective  Right Axilla (16): Erythematous keratotic or waxy stuck-on papule or plaque.   Objective  Face: Active papule of the L cheek   Assessment & Plan  Skin tag (9) Right Axilla x 9 Inflamed and irritated - patient would like treated today  Anesthesia and snip excision x 9 performed after informed consent.  Inflamed seborrheic keratosis (16) Right Axilla  Destruction of lesion - Right Axilla Complexity: simple   Destruction method: cryotherapy   Informed consent: discussed and consent obtained   Timeout:  patient name, date of birth, surgical site, and procedure verified Lesion destroyed using liquid nitrogen: Yes   Region frozen until ice ball extended beyond lesion: Yes   Outcome: patient tolerated procedure well with no complications   Post-procedure details: wound care instructions given    Perioral dermatitis Face Adora Fridge -  Start Doxycycline 100mg  po QD x 1 week. Doxycycline should be taken with food to prevent nausea. Do not lay down for 30 minutes  after taking. Be cautious with sun exposure and use good sun protection while on this medication. Pregnant women should not take this medication.   Rosacea is a chronic progressive skin condition usually affecting the face of adults, causing redness and/or acne bumps. It is treatable but not curable. It sometimes affects the eyes (ocular rosacea) as well. It may respond to topical and/or systemic medication and can flare with stress, sun exposure, alcohol, exercise and some foods.  Daily application of broad spectrum spf 30+ sunscreen to face is recommended to reduce flares.  Continue Skin Medicinals mix BID.    doxycycline (VIBRA-TABS) 100 MG tablet - Face  Other Related Medications metroNIDAZOLE (METROCREAM) 0.75 % cream   Seborrheic Keratoses - Stuck-on, waxy, tan-brown papules and plaques  - Discussed benign etiology and prognosis. - Observe - Call for any changes  Hemangiomas - Red papules - Discussed benign nature - Observe - Call for any changes  Sebaceous Hyperplasia - Small yellow papules with a central dell - Benign - Observe  Return in about 3 months (around 12/29/2020) for ISK, perioral dermatitis follow up .  Luther Redo, CMA, am acting as scribe for Sarina Ser, MD .  Documentation: I have reviewed the above documentation for accuracy and completeness, and I agree with the above.  Sarina Ser, MD

## 2020-10-01 ENCOUNTER — Ambulatory Visit (INDEPENDENT_AMBULATORY_CARE_PROVIDER_SITE_OTHER): Payer: Medicare HMO

## 2020-10-01 ENCOUNTER — Ambulatory Visit (INDEPENDENT_AMBULATORY_CARE_PROVIDER_SITE_OTHER): Payer: Medicare HMO | Admitting: Podiatry

## 2020-10-01 ENCOUNTER — Ambulatory Visit: Admission: RE | Admit: 2020-10-01 | Payer: Medicare HMO | Source: Ambulatory Visit

## 2020-10-01 DIAGNOSIS — Z9889 Other specified postprocedural states: Secondary | ICD-10-CM

## 2020-10-01 DIAGNOSIS — M86072 Acute hematogenous osteomyelitis, left ankle and foot: Secondary | ICD-10-CM | POA: Diagnosis not present

## 2020-10-01 NOTE — Progress Notes (Signed)
   Subjective:  Patient presents today status post partial fifth ray amputation left foot. DOS: 09/02/2020.  Patient continues to change the dressings.  No new complaints at this time  Past Medical History:  Diagnosis Date  . Anemia   . Arthritis   . Asthma   . B12 deficiency   . Back pain   . Carpal tunnel syndrome   . Chronic kidney disease   . Constipation   . Depression   . Depressive disorder   . Diabetes mellitus   . Dyspnea   . Fluid retention   . Foot pain   . GERD (gastroesophageal reflux disease)   . Headache   . History of hiatal hernia   . Hyperlipidemia   . Hypertension   . IBS (irritable bowel syndrome)   . Insomnia   . Joint pain   . Lumbago   . Memory loss   . Obesity   . 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)   . Pneumonia   . Rhinitis, allergic   . Rosacea   . Sleep apnea   . SOB (shortness of breath)   . Thyroid disease   . Unspecified hearing loss   . Unspecified hereditary and idiopathic peripheral neuropathy   . Unspecified sleep apnea   . Vitamin D deficiency    Objective: Physical Exam General: The patient is alert and oriented x3 in no acute distress.  Dermatology: Dehiscence noted to the distal portion of the incision site.  Overall the drainage is improved significantly.  The proximal portion of the incision site has healed. Vascular: Palpable pedal pulses bilaterally.  Moderate edema with erythema noted to the foot. Capillary refill within normal limits.  Neurological: Epicritic and protective threshold grossly intact bilaterally.   Musculoskeletal Exam: All pedal and ankle joints range of motion within normal limits bilateral. Muscle strength 5/5 in all groups bilateral.   Assessment: 1. s/p partial fifth ray amputation left foot. DOS: 09/02/2020   Plan of Care:  1. Patient was evaluated.   2.  The proximal portion of the incision has healed.  The distal portion remains open.  Today it actually appears very healthy and viable with  minimal drainage.  Decision was made to lightly debride the area and primarily close the distal incision site to see if it will heal.  4-0 Prolene suture was utilized to reapproximate the skin edges of the incision site. 3.  Dry sterile dressing applied.  Keep clean dry and intact x1 week 4.  Patient scheduled for CT with contrast 10/12/2020. 5.  Patient currently not on oral antibiotics.  At the moment there is not any acute inflammatory condition that requires antibiotics at the moment.   6.  Return to clinic in 1 week  Edrick Kins, DPM [facility]  Dr. Edrick Kins, DPM    [address], [place] [address]                Office [phone]  Fax [phone]

## 2020-10-04 ENCOUNTER — Encounter: Payer: Self-pay | Admitting: Dermatology

## 2020-10-05 ENCOUNTER — Encounter: Payer: Medicare HMO | Admitting: Podiatry

## 2020-10-05 ENCOUNTER — Other Ambulatory Visit: Payer: Self-pay | Admitting: Family Medicine

## 2020-10-05 DIAGNOSIS — G4733 Obstructive sleep apnea (adult) (pediatric): Secondary | ICD-10-CM

## 2020-10-05 DIAGNOSIS — F331 Major depressive disorder, recurrent, moderate: Secondary | ICD-10-CM

## 2020-10-07 DIAGNOSIS — E1159 Type 2 diabetes mellitus with other circulatory complications: Secondary | ICD-10-CM | POA: Diagnosis not present

## 2020-10-07 DIAGNOSIS — Z794 Long term (current) use of insulin: Secondary | ICD-10-CM | POA: Diagnosis not present

## 2020-10-07 DIAGNOSIS — I152 Hypertension secondary to endocrine disorders: Secondary | ICD-10-CM | POA: Diagnosis not present

## 2020-10-07 DIAGNOSIS — E1169 Type 2 diabetes mellitus with other specified complication: Secondary | ICD-10-CM | POA: Diagnosis not present

## 2020-10-07 DIAGNOSIS — E1142 Type 2 diabetes mellitus with diabetic polyneuropathy: Secondary | ICD-10-CM | POA: Diagnosis not present

## 2020-10-07 DIAGNOSIS — E785 Hyperlipidemia, unspecified: Secondary | ICD-10-CM | POA: Diagnosis not present

## 2020-10-07 DIAGNOSIS — E039 Hypothyroidism, unspecified: Secondary | ICD-10-CM | POA: Diagnosis not present

## 2020-10-07 LAB — HEMOGLOBIN A1C: Hemoglobin A1C: 7.1

## 2020-10-08 ENCOUNTER — Encounter: Payer: Self-pay | Admitting: Podiatry

## 2020-10-08 ENCOUNTER — Ambulatory Visit (INDEPENDENT_AMBULATORY_CARE_PROVIDER_SITE_OTHER): Payer: Medicare HMO | Admitting: Podiatry

## 2020-10-08 ENCOUNTER — Ambulatory Visit (INDEPENDENT_AMBULATORY_CARE_PROVIDER_SITE_OTHER): Payer: Medicare HMO

## 2020-10-08 ENCOUNTER — Other Ambulatory Visit: Payer: Self-pay

## 2020-10-08 DIAGNOSIS — T8131XA Disruption of external operation (surgical) wound, not elsewhere classified, initial encounter: Secondary | ICD-10-CM

## 2020-10-08 DIAGNOSIS — Z9889 Other specified postprocedural states: Secondary | ICD-10-CM

## 2020-10-08 DIAGNOSIS — M86072 Acute hematogenous osteomyelitis, left ankle and foot: Secondary | ICD-10-CM | POA: Diagnosis not present

## 2020-10-10 NOTE — Progress Notes (Signed)
   Subjective:  Patient presents today status post partial fifth ray amputation left foot. DOS: 09/02/2020.  Patient continues to change the dressings.  No new complaints at this time.  She is very anxious today to have the CT scan performed to her left foot which is scheduled for 10/12/2020.  Past Medical History:  Diagnosis Date  . Anemia   . Arthritis   . Asthma   . B12 deficiency   . Back pain   . Carpal tunnel syndrome   . Chronic kidney disease   . Constipation   . Depression   . Depressive disorder   . Diabetes mellitus   . Dyspnea   . Fluid retention   . Foot pain   . GERD (gastroesophageal reflux disease)   . Headache   . History of hiatal hernia   . Hyperlipidemia   . Hypertension   . IBS (irritable bowel syndrome)   . Insomnia   . Joint pain   . Lumbago   . Memory loss   . Obesity   . 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)   . Pneumonia   . Rhinitis, allergic   . Rosacea   . Sleep apnea   . SOB (shortness of breath)   . Thyroid disease   . Unspecified hearing loss   . Unspecified hereditary and idiopathic peripheral neuropathy   . Unspecified sleep apnea   . Vitamin D deficiency    Objective: Physical Exam General: The patient is alert and oriented x3 in no acute distress.  Dermatology: Dehiscence noted to the distal portion of the incision site.  Overall the drainage continues to improve significantly.  The proximal portion of the incision site has healed.  Vascular: Palpable pedal pulses bilaterally.  Edema and erythema to the foot appear to be improved significantly this week.. Capillary refill within normal limits.  Neurological: Epicritic and protective threshold diminished bilaterally.   Musculoskeletal Exam: Partial fifth ray amputation left foot.  No other pedal deformity  Assessment: 1. s/p partial fifth ray amputation left foot. DOS: 09/02/2020   Plan of Care:  1. Patient was evaluated.   2.  Remaining sutures removed today.  Light debridement of  the dehisced area was performed today.   3.  Dry sterile dressing applied.  Keep clean dry and intact x1 week 4.  Patient scheduled for CT with contrast 10/12/2020. 5.  Patient currently not on oral antibiotics.  At the moment there is not any acute inflammatory condition that requires antibiotics at the moment.   6.  Return to clinic in 1 week to review CT scan results  Edrick Kins, DPM [facility]  Dr. Edrick Kins, DPM    [address], [place] [address]                Office [phone]  Fax [phone]

## 2020-10-12 ENCOUNTER — Ambulatory Visit: Admission: RE | Admit: 2020-10-12 | Payer: Medicare HMO | Source: Ambulatory Visit

## 2020-10-14 DIAGNOSIS — I509 Heart failure, unspecified: Secondary | ICD-10-CM | POA: Diagnosis not present

## 2020-10-14 DIAGNOSIS — Z89422 Acquired absence of other left toe(s): Secondary | ICD-10-CM | POA: Diagnosis not present

## 2020-10-14 DIAGNOSIS — E1122 Type 2 diabetes mellitus with diabetic chronic kidney disease: Secondary | ICD-10-CM | POA: Diagnosis not present

## 2020-10-14 DIAGNOSIS — I10 Essential (primary) hypertension: Secondary | ICD-10-CM | POA: Diagnosis not present

## 2020-10-14 DIAGNOSIS — D631 Anemia in chronic kidney disease: Secondary | ICD-10-CM | POA: Diagnosis not present

## 2020-10-14 DIAGNOSIS — N1832 Chronic kidney disease, stage 3b: Secondary | ICD-10-CM | POA: Diagnosis not present

## 2020-10-14 DIAGNOSIS — I110 Hypertensive heart disease with heart failure: Secondary | ICD-10-CM | POA: Diagnosis not present

## 2020-10-14 DIAGNOSIS — I779 Disorder of arteries and arterioles, unspecified: Secondary | ICD-10-CM | POA: Diagnosis not present

## 2020-10-19 ENCOUNTER — Other Ambulatory Visit: Payer: Self-pay

## 2020-10-19 ENCOUNTER — Other Ambulatory Visit: Payer: Self-pay | Admitting: Podiatry

## 2020-10-19 ENCOUNTER — Ambulatory Visit (INDEPENDENT_AMBULATORY_CARE_PROVIDER_SITE_OTHER): Payer: Medicare HMO | Admitting: Podiatry

## 2020-10-19 DIAGNOSIS — E0843 Diabetes mellitus due to underlying condition with diabetic autonomic (poly)neuropathy: Secondary | ICD-10-CM

## 2020-10-19 DIAGNOSIS — T8131XA Disruption of external operation (surgical) wound, not elsewhere classified, initial encounter: Secondary | ICD-10-CM

## 2020-10-19 DIAGNOSIS — L97522 Non-pressure chronic ulcer of other part of left foot with fat layer exposed: Secondary | ICD-10-CM

## 2020-10-19 MED ORDER — DOXYCYCLINE HYCLATE 100 MG PO TABS: 100.0000 mg | ORAL_TABLET | Freq: Two times a day (BID) | ORAL | 0 refills | Status: DC

## 2020-10-19 NOTE — Progress Notes (Signed)
Subjective:  Patient presents today status post partial fifth ray amputation left foot. DOS: 09/02/2020.  Patient continues to change the dressings.  No new complaints at this time.    Past Medical History:  Diagnosis Date  . Anemia   . Arthritis   . Asthma   . B12 deficiency   . Back pain   . Carpal tunnel syndrome   . Chronic kidney disease   . Constipation   . Depression   . Depressive disorder   . Diabetes mellitus   . Dyspnea   . Fluid retention   . Foot pain   . GERD (gastroesophageal reflux disease)   . Headache   . History of hiatal hernia   . Hyperlipidemia   . Hypertension   . IBS (irritable bowel syndrome)   . Insomnia   . Joint pain   . Lumbago   . Memory loss   . Obesity   . 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)   . Pneumonia   . Rhinitis, allergic   . Rosacea   . Sleep apnea   . SOB (shortness of breath)   . Thyroid disease   . Unspecified hearing loss   . Unspecified hereditary and idiopathic peripheral neuropathy   . Unspecified sleep apnea   . Vitamin D deficiency    Objective: Physical Exam General: The patient is alert and oriented x3 in no acute distress.  Dermatology: Dehiscence noted to the distal portion of the incision site measuring approximately 2.0 x 0.5 x 1.0 cm LxWxD.  To the noted ulcer there is no eschar.  There is a minimal amount of slough fibrin and necrotic tissue noted.  Wound base and granulation tissue is red.  There is no exposed bone muscle tendon ligament or joint.  No purulence.  Periwound integrity is intact.  Vascular: Palpable pedal pulses bilaterally.  Edema and erythema to the foot appear to be improved significantly this week.. Capillary refill within normal limits.  Neurological: Epicritic and protective threshold diminished bilaterally.   Musculoskeletal Exam: Partial fifth ray amputation left foot.  No other pedal deformity  Assessment: 1. s/p partial fifth ray amputation left foot. DOS: 09/02/2020 2.  Ulcer left  foot 3.  Diabetes mellitus   Plan of Care:  1. Patient was evaluated.   2.  Medically necessary excisional debridement including muscle and deep fascial tissue was performed using a curette with post debridement measurement same as pre-.  Excisional debridement of all the necrotic nonviable tissue down to the healthy bleeding viable tissue was performed. 3.  Since the wound is very deep and the edges of the wound coapt well together the decision was made today to primarily close the wound site.  4-0 Prolene suture was utilized and interrupted sutures were applied for good apposition of the opposing edges of the wound. 4.  Dry sterile dressing was applied with instructions to keep clean dry and intact x1 week 5.  Refill prescription for doxycycline 100 mg 2 times daily 6.  The patient has demonstrated steady improvement postoperatively.  We are unable to obtain an MRI or CT with contrast due to concerns of renal function.  I believe the CT without contrast would be minimally beneficial.  For the moment we will hold off on any advanced imaging modalities since there continues to be good demonstration of healing of the foot 7.  Return to clinic in 1 week  Edrick Kins, DPM [facility]  Dr. Edrick Kins, DPM  [address], [place] [address]                Office [phone]  Fax [phone]

## 2020-10-22 LAB — WOUND CULTURE

## 2020-10-26 ENCOUNTER — Ambulatory Visit (INDEPENDENT_AMBULATORY_CARE_PROVIDER_SITE_OTHER): Payer: Medicare HMO | Admitting: Podiatry

## 2020-10-26 ENCOUNTER — Other Ambulatory Visit: Payer: Self-pay

## 2020-10-26 DIAGNOSIS — L97522 Non-pressure chronic ulcer of other part of left foot with fat layer exposed: Secondary | ICD-10-CM

## 2020-10-26 DIAGNOSIS — E0843 Diabetes mellitus due to underlying condition with diabetic autonomic (poly)neuropathy: Secondary | ICD-10-CM

## 2020-10-26 NOTE — Progress Notes (Signed)
   Subjective:  Patient presents today status post partial fifth ray amputation left foot. DOS: 09/02/2020.  Last visit on 10/19/2020 the decision was made in office to suture the distal portion of the dehiscence amputation site.  The dressings have been clean dry and intact for the last week.  She presents for follow-up treatment and evaluation  Past Medical History:  Diagnosis Date  . Anemia   . Arthritis   . Asthma   . B12 deficiency   . Back pain   . Carpal tunnel syndrome   . Chronic kidney disease   . Constipation   . Depression   . Depressive disorder   . Diabetes mellitus   . Dyspnea   . Fluid retention   . Foot pain   . GERD (gastroesophageal reflux disease)   . Headache   . History of hiatal hernia   . Hyperlipidemia   . Hypertension   . IBS (irritable bowel syndrome)   . Insomnia   . Joint pain   . Lumbago   . Memory loss   . Obesity   . 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)   . Pneumonia   . Rhinitis, allergic   . Rosacea   . Sleep apnea   . SOB (shortness of breath)   . Thyroid disease   . Unspecified hearing loss   . Unspecified hereditary and idiopathic peripheral neuropathy   . Unspecified sleep apnea   . Vitamin D deficiency    Objective: Physical Exam General: The patient is alert and oriented x3 in no acute distress.  Dermatology: Sutures are intact.  It appears to be healing and very well coapted.  No drainage.  No erythema.  No edema noted  Vascular: Palpable pedal pulses bilaterally.  Negative for any significant edema or erythema to the foot.. Capillary refill within normal limits.  Neurological: Epicritic and protective threshold diminished bilaterally.   Musculoskeletal Exam: Partial fifth ray amputation left foot.  No other pedal deformity  Assessment: 1. s/p partial fifth ray amputation left foot. DOS: 09/02/2020 2.  Delayed primary closure right fifth ray amputation dehiscence   Plan of Care:  1. Patient was evaluated.  Overall the  patient is doing very well and the incision/amputation area appears to be healing very nicely 2.  Sutures left intact today.  She may begin washing and showering and getting the foot wet.  Recommend antibiotic ointment and a light dressing daily 3.  Patient may transition out of the postoperative shoe into good supportive sneakers 4.  Return to clinic in 10 days for suture removal  Edrick Kins, DPM [facility]  Dr. Edrick Kins, DPM    [address], [place] [address]                Office [phone]  Fax [phone]

## 2020-10-27 ENCOUNTER — Other Ambulatory Visit: Payer: Self-pay | Admitting: Family Medicine

## 2020-10-27 DIAGNOSIS — K219 Gastro-esophageal reflux disease without esophagitis: Secondary | ICD-10-CM

## 2020-11-03 ENCOUNTER — Other Ambulatory Visit (INDEPENDENT_AMBULATORY_CARE_PROVIDER_SITE_OTHER): Payer: Self-pay | Admitting: Nurse Practitioner

## 2020-11-03 ENCOUNTER — Other Ambulatory Visit (INDEPENDENT_AMBULATORY_CARE_PROVIDER_SITE_OTHER): Payer: Self-pay | Admitting: Vascular Surgery

## 2020-11-03 DIAGNOSIS — L97529 Non-pressure chronic ulcer of other part of left foot with unspecified severity: Secondary | ICD-10-CM

## 2020-11-03 DIAGNOSIS — S81802A Unspecified open wound, left lower leg, initial encounter: Secondary | ICD-10-CM

## 2020-11-04 ENCOUNTER — Ambulatory Visit (INDEPENDENT_AMBULATORY_CARE_PROVIDER_SITE_OTHER): Payer: Medicare HMO

## 2020-11-04 ENCOUNTER — Ambulatory Visit (INDEPENDENT_AMBULATORY_CARE_PROVIDER_SITE_OTHER): Payer: Medicare HMO | Admitting: Nurse Practitioner

## 2020-11-04 ENCOUNTER — Encounter (INDEPENDENT_AMBULATORY_CARE_PROVIDER_SITE_OTHER): Payer: Self-pay | Admitting: Nurse Practitioner

## 2020-11-04 ENCOUNTER — Encounter (INDEPENDENT_AMBULATORY_CARE_PROVIDER_SITE_OTHER): Payer: Medicare HMO | Admitting: Vascular Surgery

## 2020-11-04 ENCOUNTER — Other Ambulatory Visit: Payer: Self-pay

## 2020-11-04 VITALS — BP 122/67 | HR 73 | Resp 16 | Ht 62.0 in | Wt 243.0 lb

## 2020-11-04 DIAGNOSIS — E1169 Type 2 diabetes mellitus with other specified complication: Secondary | ICD-10-CM | POA: Diagnosis not present

## 2020-11-04 DIAGNOSIS — E785 Hyperlipidemia, unspecified: Secondary | ICD-10-CM

## 2020-11-04 DIAGNOSIS — I7025 Atherosclerosis of native arteries of other extremities with ulceration: Secondary | ICD-10-CM

## 2020-11-04 DIAGNOSIS — S81802A Unspecified open wound, left lower leg, initial encounter: Secondary | ICD-10-CM | POA: Diagnosis not present

## 2020-11-04 DIAGNOSIS — Z794 Long term (current) use of insulin: Secondary | ICD-10-CM

## 2020-11-05 ENCOUNTER — Ambulatory Visit (INDEPENDENT_AMBULATORY_CARE_PROVIDER_SITE_OTHER): Payer: Medicare HMO

## 2020-11-05 ENCOUNTER — Encounter: Payer: Self-pay | Admitting: Podiatry

## 2020-11-05 ENCOUNTER — Other Ambulatory Visit: Payer: Self-pay

## 2020-11-05 ENCOUNTER — Ambulatory Visit (INDEPENDENT_AMBULATORY_CARE_PROVIDER_SITE_OTHER): Payer: Medicare HMO | Admitting: Podiatry

## 2020-11-05 DIAGNOSIS — E0843 Diabetes mellitus due to underlying condition with diabetic autonomic (poly)neuropathy: Secondary | ICD-10-CM

## 2020-11-05 DIAGNOSIS — Z9889 Other specified postprocedural states: Secondary | ICD-10-CM

## 2020-11-05 DIAGNOSIS — L97522 Non-pressure chronic ulcer of other part of left foot with fat layer exposed: Secondary | ICD-10-CM

## 2020-11-05 DIAGNOSIS — M10471 Other secondary gout, right ankle and foot: Secondary | ICD-10-CM

## 2020-11-05 DIAGNOSIS — M1A9XX1 Chronic gout, unspecified, with tophus (tophi): Secondary | ICD-10-CM

## 2020-11-05 DIAGNOSIS — M10071 Idiopathic gout, right ankle and foot: Secondary | ICD-10-CM

## 2020-11-05 NOTE — Patient Instructions (Signed)
Pre-Operative Instructions  Congratulations, you have decided to take an important step towards improving your quality of life.  You can be assured that the doctors and staff at [facility] will be with you every step of the way.  Here are some important things you should know:  1. Plan to be at the surgery center/hospital at least 1 (one) hour prior to your scheduled time, unless otherwise directed by the surgical center/hospital staff.  You must have a responsible adult accompany you, remain during the surgery and drive you home.  Make sure you have directions to the surgical center/hospital to ensure you arrive on time. 2. If you are having surgery at [facility] or [facility] hospitals, you will need a copy of your medical history and physical form from your family physician within one month prior to the date of surgery. We will give you a form for your primary physician to complete.  3. We make every effort to accommodate the date you request for surgery.  However, there are times where surgery dates or times have to be moved.  We will contact you as soon as possible if a change in schedule is required.   4. No aspirin/ibuprofen for one week before surgery.  If you are on aspirin, any non-steroidal anti-inflammatory medications (Mobic, Aleve, Ibuprofen) should not be taken seven (7) days prior to your surgery.  You make take Tylenol for pain prior to surgery.  5. Medications - If you are taking daily heart and blood pressure medications, seizure, reflux, allergy, asthma, anxiety, pain or diabetes medications, make sure you notify the surgery center/hospital before the day of surgery so they can tell you which medications you should take or avoid the day of surgery. 6. No food or drink after midnight the night before surgery unless directed otherwise by surgical center/hospital staff. 7. No alcoholic beverages 24-hours prior to surgery.  No smoking 24-hours prior or 24-hours after  surgery. 8. Wear loose pants or shorts. They should be loose enough to fit over bandages, boots, and casts. 9. Don't wear slip-on shoes. Sneakers are preferred. 10. Bring your boot with you to the surgery center/hospital.  Also bring crutches or a walker if your physician has prescribed it for you.  If you do not have this equipment, it will be provided for you after surgery. 11. If you have not been contacted by the surgery center/hospital by the day before your surgery, call to confirm the date and time of your surgery. 12. Leave-time from work may vary depending on the type of surgery you have.  Appropriate arrangements should be made prior to surgery with your employer. 13. Prescriptions will be provided immediately following surgery by your doctor.  Fill these as soon as possible after surgery and take the medication as directed. Pain medications will not be refilled on weekends and must be approved by the doctor. 14. Remove nail polish on the operative foot and avoid getting pedicures prior to surgery. 15. Wash the night before surgery.  The night before surgery wash the foot and leg well with water and the antibacterial soap provided. Be sure to pay special attention to beneath the toenails and in between the toes.  Wash for at least three (3) minutes. Rinse thoroughly with water and dry well with a towel.  Perform this wash unless told not to do so by your physician.  Enclosed: 1 Ice pack (please put in freezer the night before surgery)   1 Hibiclens skin cleaner     Pre-op instructions  If you have any questions regarding the instructions, please do not hesitate to call our office.  [place]: [address], [place], [place] [address] -- [phone]  [place]: [address] [place], [place] [address] -- [phone]  [place]: [address], [place], [place] [address] -- [phone]   Website: [email or web address] 

## 2020-11-07 ENCOUNTER — Encounter (INDEPENDENT_AMBULATORY_CARE_PROVIDER_SITE_OTHER): Payer: Self-pay | Admitting: Nurse Practitioner

## 2020-11-07 NOTE — Progress Notes (Signed)
 Subjective:    Patient ID: Summer [name], female    DOB: 04/23/1951, 69 y.o.   MRN: [id number] Chief Complaint  Patient presents with  . New Patient (Initial Visit)    Ref lateef non healing wound left toe amputation    Summer [name] is a 69-year-old female that presents today for evaluation of a slow healing wound on her left lower extremity.  The patient is referred by Dr. Lateef.  The patient had amputation of her left fifth toe approximately 09/02/2020.  That wound has been very slow to heal and has had osteomyelitis.  Patient was originally scheduled to undergo a CT scan of her lower extremity to evaluate perfusion however she was unable to due to her kidney function.  The patient has several known risk factors for peripheral arterial disease including diabetes, high blood pressure and high cholesterol.  Patient denies rest pain.  She denies claudication symptoms however she is not very active due to the amputation.  Today noninvasive studies show an ABI 0.91 on the right and 0.88 on the left.  The patient has triphasic tibial artery waveforms on the right biphasic on the left.  Patient has good toe waveforms bilaterally.   Review of Systems  Cardiovascular: Positive for leg swelling.  Skin: Positive for wound.  All other systems reviewed and are negative.      Objective:   Physical Exam Vitals reviewed.  HENT:     Head: Normocephalic.  Cardiovascular:     Rate and Rhythm: Normal rate.     Pulses: Normal pulses.  Pulmonary:     Effort: Pulmonary effort is normal.  Skin:    General: Skin is warm and dry.     Findings: Erythema present.  Neurological:     Mental Status: She is alert and oriented to person, place, and time.  Psychiatric:        Mood and Affect: Mood normal.        Behavior: Behavior normal.        Thought Content: Thought content normal.        Judgment: Judgment normal.     BP 122/67 (BP Location: Right Arm)   Pulse 73   Resp 16   Ht 5' 2" (1.575 m)    Wt 243 lb (110.2 kg)   BMI 44.45 kg/m   Past Medical History:  Diagnosis Date  . Anemia   . Arthritis   . Asthma   . B12 deficiency   . Back pain   . Carpal tunnel syndrome   . Chronic kidney disease   . Constipation   . Depression   . Depressive disorder   . Diabetes mellitus   . Dyspnea   . Fluid retention   . Foot pain   . GERD (gastroesophageal reflux disease)   . Headache   . History of hiatal hernia   . Hyperlipidemia   . Hypertension   . IBS (irritable bowel syndrome)   . Insomnia   . Joint pain   . Lumbago   . Memory loss   . Obesity   . 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)   . Pneumonia   . Rhinitis, allergic   . Rosacea   . Sleep apnea   . SOB (shortness of breath)   . Thyroid disease   . Unspecified hearing loss   . Unspecified hereditary and idiopathic peripheral neuropathy   . Unspecified sleep apnea   . Vitamin D deficiency     Social History   Socioeconomic   History  . Marital status: Significant Other    Spouse name: Darrell Lambe  . Number of children: 1  . Years of education: Not on file  . Highest education level: High school graduate  Occupational History  . Occupation: retired  Tobacco Use  . Smoking status: Never Smoker  . Smokeless tobacco: Never Used  Vaping Use  . Vaping Use: Never used  Substance and Sexual Activity  . Alcohol use: No    Alcohol/week: 0.0 standard drinks  . Drug use: No  . Sexual activity: Yes    Partners: Male  Other Topics Concern  . Not on file  Social History Narrative   Son lives in [place] (retired from [employer]) and her granddaughter (23yrs old) lives in [place] with her mother.   Social Determinants of Health   Financial Resource Strain: Low Risk   . Difficulty of Paying Living Expenses: Not hard at all  Food Insecurity: No Food Insecurity  . Worried About Running Out of Food in the Last Year: Never true  . Ran Out of Food in the Last Year: Never true  Transportation Needs: No Transportation Needs   . Lack of Transportation (Medical): No  . Lack of Transportation (Non-Medical): No  Physical Activity: Inactive  . Days of Exercise per Week: 0 days  . Minutes of Exercise per Session: 0 min  Stress: No Stress Concern Present  . Feeling of Stress : Only a little  Social Connections: Moderately Isolated  . Frequency of Communication with Friends and Family: More than three times a week  . Frequency of Social Gatherings with Friends and Family: Once a week  . Attends Religious Services: Never  . Active Member of Clubs or Organizations: No  . Attends Club or Organization Meetings: Never  . Marital Status: Living with partner  Intimate Partner Violence: Not At Risk  . Fear of Current or Ex-Partner: No  . Emotionally Abused: No  . Physically Abused: No  . Sexually Abused: No    Past Surgical History:  Procedure Laterality Date  . ABDOMINAL HYSTERECTOMY  1975  . ANKLE SURGERY Left approx Jan 2018  . APPENDECTOMY  1970  . CATARACT EXTRACTION  01/2011   right  . COLONOSCOPY WITH PROPOFOL N/A 08/19/2019   Procedure: COLONOSCOPY WITH PROPOFOL;  Surgeon: Anna, Kiran, MD;  Location: [facility] ENDOSCOPY;  Service: Gastroenterology;  Laterality: N/A;  . eye lid surgery  2013   bilateral  . FOOT SURGERY    . NECK SURGERY    . SPINE SURGERY    . TUBAL LIGATION    . VAGINAL HYSTERECTOMY  1989    Family History  Problem Relation Age of Onset  . Aneurysm Mother   . Aortic aneurysm Mother   . Hypertension Mother   . Hyperlipidemia Mother   . Heart disease Mother   . Obesity Mother   . Heart attack Maternal Grandfather   . Diabetes Maternal Grandfather     Allergies  Allergen Reactions  . Codeine Other (See Comments)    "TRIPPED OUT"  DIDN'T LIKE THE WAY IT FELT  . Atorvastatin     muscle pain  . Hydrocodone     itching  . Tramadol   . Latex Rash  . Zolpidem Other (See Comments)    Sleep walk    CBC Latest Ref Rng & Units 05/13/2020 10/14/2019 04/02/2019  WBC 3.4 - 10.8 x10E3/uL  8.0 8.6 9.2  Hemoglobin 11.1 - 15.9 g/dL 11.1 11.4(L) 11.8(L)  Hematocrit 34.0 - 46.6 %   33.7(L) 33.7(L) 35.3(L)  Platelets 150 - 450 x10E3/uL 153 238 254      CMP     Component Value Date/Time   NA 141 09/24/2020 1303   K 4.6 09/24/2020 1303   CL 101 09/24/2020 1303   CO2 24 09/24/2020 1303   GLUCOSE 106 (H) 09/24/2020 1303   GLUCOSE 72 10/17/2019 0755   BUN 34 (H) 09/24/2020 1303   CREATININE 1.50 (H) 09/24/2020 1303   CREATININE 1.42 (H) 10/14/2019 1142   CALCIUM 10.0 09/24/2020 1303   PROT 6.9 09/24/2020 1303   ALBUMIN 4.4 09/24/2020 1303   AST 15 09/24/2020 1303   ALT 14 09/24/2020 1303   ALKPHOS 100 09/24/2020 1303   BILITOT 0.3 09/24/2020 1303   GFRNONAA 35 (L) 09/24/2020 1303   GFRNONAA 37 (L) 10/14/2019 1142   GFRAA 40 (L) 09/24/2020 1303   GFRAA 43 (L) 10/14/2019 1142     No results found.     Assessment & Plan:   1. Atherosclerosis of native arteries of the extremities with ulceration ([name])  Recommend:  The patient has had a slow healing wound with no evidence of PAD.  The patient has evidence of severe atherosclerotic changes of both lower extremities associated with ulceration and tissue loss of the foot.    Patient should undergo angiography of the left lower extremity with the hope for intervention for limb salvage.  The risks and benefits as well as the alternative therapies was discussed in detail with the patient.  All questions were answered.  Patient agrees to proceed with angiography.  The patient will follow up with me in the office after the procedure.    2. Type 2 diabetes mellitus with other specified complication, with long-term current use of insulin ([name]) Continue hypoglycemic medications as already ordered, these medications have been reviewed and there are no changes at this time.  Hgb A1C to be monitored as already arranged by primary service   3. Dyslipidemia associated with type 2 diabetes mellitus ([name]) Continue statin as  ordered and reviewed, no changes at this time    Current Outpatient Medications on File Prior to Visit  Medication Sig Dispense Refill  . ACCU-CHEK SOFTCLIX LANCETS lancets     . acetaminophen (TYLENOL 8 HOUR) 650 MG CR tablet Take 1 tablet (650 mg total) by mouth every 8 (eight) hours as needed for pain. 90 tablet 2  . albuterol (PROVENTIL) (2.5 MG/3ML) 0.083% nebulizer solution Take 3 mLs (2.5 mg total) by nebulization every 6 (six) hours as needed. 75 mL 2  . albuterol (VENTOLIN HFA) 108 (90 Base) MCG/ACT inhaler every 6 (six) hours as needed.    . Alcohol Swabs (B-D SINGLE USE SWABS REGULAR) PADS     . amLODipine (NORVASC) 5 MG tablet Take 1 tablet (5 mg total) by mouth daily. 90 tablet 1  . ASPIRIN LOW DOSE 81 MG EC tablet TAKE 1 TABLET (81 MG TOTAL) BY MOUTH DAILY. 30 tablet 0  . Blood Glucose Monitoring Suppl (ACCU-CHEK AVIVA PLUS) w/Device KIT     . buPROPion (WELLBUTRIN XL) 150 MG 24 hr tablet Take 1 tablet by mouth daily. 90 tablet 0  . Calcium Carb-Cholecalciferol (CALCIUM 600+D3 PO) Take by mouth.    . Cyanocobalamin (VITAMIN B12 PO) Take 1 tablet by mouth daily.     . DICLOFENAC PO Take by mouth in the morning and at bedtime.    . DULoxetine (CYMBALTA) 60 MG capsule Take 60 mg by mouth daily.    . furosemide (LASIX)   20 MG tablet Take 1 tablet by mouth daily. 90 tablet 0  . insulin NPH-regular Human (70-30) 100 UNIT/ML injection Inject 90 Units into the skin 2 (two) times daily with a meal.    . irbesartan-hydrochlorothiazide (AVALIDE) 300-12.5 MG tablet Take 1 tablet by mouth daily. 90 tablet 1  . levothyroxine (SYNTHROID) 25 MCG tablet Take 1 tablet by mouth daily except, take 1 and 1/2 tablets by mouth on Sunday. 100 tablet 0  . metFORMIN (GLUCOPHAGE) 500 MG tablet Take 500 mg by mouth daily.    . metoprolol tartrate (LOPRESSOR) 25 MG tablet Take 1 tablet (25 mg total) by mouth 2 (two) times daily. 180 tablet 1  . Misc Natural Products (OSTEO BI-FLEX ADV JOINT SHIELD PO) Take  by mouth 2 (two) times daily.     . montelukast (SINGULAIR) 10 MG tablet Take 1 tablet by mouth at bedtime. 90 tablet 0  . Omega-3 Fatty Acids (FISH OIL) 1000 MG CAPS Take 2 capsules by mouth daily.    . omeprazole (PRILOSEC) 40 MG capsule Take 1 capsule by mouth daily. 90 capsule 0  . Potassium 99 MG TABS Take by mouth daily at 6 (six) AM.    . pregabalin (LYRICA) 300 MG capsule Take 300 mg by mouth 2 (two) times a day.    . Probiotic Product (PROBIOTIC-10 PO) Take by mouth.    . RELION INSULIN SYRINGE 1ML/31G 31G X 5/16" 1 ML MISC     . rosuvastatin (CRESTOR) 20 MG tablet Take 1 tablet (20 mg total) by mouth daily. 90 tablet 1  . sertraline (ZOLOFT) 100 MG tablet Take 1 and 1/2 tablets by mouth daily. 135 tablet 0  . tiZANidine (ZANAFLEX) 4 MG tablet Take 8 mg by mouth daily with supper.    . traZODone (DESYREL) 50 MG tablet Take 1 tablet (50 mg total) by mouth at bedtime as needed for sleep. 90 tablet 1  . vitamin C (ASCORBIC ACID) 500 MG tablet Take 500 mg by mouth 2 (two) times daily.     . VITAMIN D, CHOLECALCIFEROL, PO Take 1,000 Units by mouth daily.     . ciprofloxacin (CIPRO) 500 MG tablet Take 1 tablet (500 mg total) by mouth 2 (two) times daily. (Patient not taking: Reported on 11/04/2020) 20 tablet 0  . doxycycline (VIBRA-TABS) 100 MG tablet Take 1 tablet (100 mg total) by mouth 2 (two) times daily. (Patient not taking: Reported on 11/04/2020) 20 tablet 0  . metroNIDAZOLE (METROCREAM) 0.75 % cream Apply topically in the morning and at bedtime. (Patient not taking: Reported on 11/04/2020) 45 g 0  . Miconazole Nitrate 2 % POWD Apply to skin folds twice a day (Patient not taking: Reported on 11/04/2020) 100 g 0  . modafinil (PROVIGIL) 100 MG tablet Take 1 tablet (100 mg total) by mouth daily. (Patient not taking: Reported on 11/04/2020) 90 tablet 0   No current facility-administered medications on file prior to visit.    There are no Patient Instructions on file for this visit. No  follow-ups on file.   Maryse Brierley E Toula Miyasaki, NP   

## 2020-11-07 NOTE — H&P (View-Only) (Signed)
Subjective:    Patient ID: Summer [name], female    DOB: 04/19/1951, 70 y.o.   MRN: [id number] Chief Complaint  Patient presents with  . New Patient (Initial Visit)    Ref lateef non healing wound left toe amputation    Summer [name] is a 70 year old female that presents today for evaluation of a slow healing wound on her left lower extremity.  The patient is referred by Dr. Holley Raring.  The patient had amputation of her left fifth toe approximately 09/02/2020.  That wound has been very slow to heal and has had osteomyelitis.  Patient was originally scheduled to undergo a CT scan of her lower extremity to evaluate perfusion however she was unable to due to her kidney function.  The patient has several known risk factors for peripheral arterial disease including diabetes, high blood pressure and high cholesterol.  Patient denies rest pain.  She denies claudication symptoms however she is not very active due to the amputation.  Today noninvasive studies show an ABI 0.91 on the right and 0.88 on the left.  The patient has triphasic tibial artery waveforms on the right biphasic on the left.  Patient has good toe waveforms bilaterally.   Review of Systems  Cardiovascular: Positive for leg swelling.  Skin: Positive for wound.  All other systems reviewed and are negative.      Objective:   Physical Exam Vitals reviewed.  HENT:     Head: Normocephalic.  Cardiovascular:     Rate and Rhythm: Normal rate.     Pulses: Normal pulses.  Pulmonary:     Effort: Pulmonary effort is normal.  Skin:    General: Skin is warm and dry.     Findings: Erythema present.  Neurological:     Mental Status: She is alert and oriented to person, place, and time.  Psychiatric:        Mood and Affect: Mood normal.        Behavior: Behavior normal.        Thought Content: Thought content normal.        Judgment: Judgment normal.     BP 122/67 (BP Location: Right Arm)   Pulse 73   Resp 16   Ht _0  (1.575 m)    Wt 243 lb (110.2 kg)   BMI 44.45 kg/m   Past Medical History:  Diagnosis Date  . Anemia   . Arthritis   . Asthma   . B12 deficiency   . Back pain   . Carpal tunnel syndrome   . Chronic kidney disease   . Constipation   . Depression   . Depressive disorder   . Diabetes mellitus   . Dyspnea   . Fluid retention   . Foot pain   . GERD (gastroesophageal reflux disease)   . Headache   . History of hiatal hernia   . Hyperlipidemia   . Hypertension   . IBS (irritable bowel syndrome)   . Insomnia   . Joint pain   . Lumbago   . Memory loss   . Obesity   . 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)   . Pneumonia   . Rhinitis, allergic   . Rosacea   . Sleep apnea   . SOB (shortness of breath)   . Thyroid disease   . Unspecified hearing loss   . Unspecified hereditary and idiopathic peripheral neuropathy   . Unspecified sleep apnea   . Vitamin D deficiency     Social History   Socioeconomic  History  . Marital status: Significant Other    Spouse name: [name] Rump  . Number of children: 1  . Years of education: Not on file  . Highest education level: High school graduate  Occupational History  . Occupation: retired  Tobacco Use  . Smoking status: Never Smoker  . Smokeless tobacco: Never Used  Vaping Use  . Vaping Use: Never used  Substance and Sexual Activity  . Alcohol use: No    Alcohol/week: 0.0 standard drinks  . Drug use: No  . Sexual activity: Yes    Partners: Male  Other Topics Concern  . Not on file  Social History Narrative   Son lives in [place] (retired from [employer]) and her granddaughter (69yr old[place]   Social Determinants of Health   Financial Resource Strain: Low Risk   . Difficulty of Paying Living Expenses: Not hard at all  Food Insecurity: No Food Insecurity  . Worried About RCharity fundraiserin the Last Year: Never true  . Ran Out of Food in the Last Year: Never true  Transportation Needs: No Transportation Needs   . Lack of Transportation (Medical): No  . Lack of Transportation (Non-Medical): No  Physical Activity: Inactive  . Days of Exercise per Week: 0 days  . Minutes of Exercise per Session: 0 min  Stress: No Stress Concern Present  . Feeling of Stress : Only a little  Social Connections: Moderately Isolated  . Frequency of Communication with Friends and Family: More than three times a week  . Frequency of Social Gatherings with Friends and Family: Once a week  . Attends Religious Services: Never  . Active Member of Clubs or Organizations: No  . Attends CArchivistMeetings: Never  . Marital Status: Living with partner  Intimate Partner Violence: Not At Risk  . Fear of Current or Ex-Partner: No  . Emotionally Abused: No  . Physically Abused: No  . Sexually Abused: No    Past Surgical History:  Procedure Laterality Date  . ABDOMINAL HYSTERECTOMY  1975  . ANKLE SURGERY Left approx Jan 2018  . APPENDECTOMY  1970  . CATARACT EXTRACTION  01/2011   right  . COLONOSCOPY WITH PROPOFOL N/A 08/19/2019   Procedure: COLONOSCOPY WITH PROPOFOL;  Surgeon: AJonathon Bellows MD;  [facility];  Service: Gastroenterology;  Laterality: N/A;  . eye lid surgery  2013   bilateral  . FOOT SURGERY    . NECK SURGERY    . SPINE SURGERY    . TUBAL LIGATION    . VAGINAL HYSTERECTOMY  1989    Family History  Problem Relation Age of Onset  . Aneurysm Mother   . Aortic aneurysm Mother   . Hypertension Mother   . Hyperlipidemia Mother   . Heart disease Mother   . Obesity Mother   . Heart attack Maternal Grandfather   . Diabetes Maternal Grandfather     Allergies  Allergen Reactions  . Codeine Other (See Comments)    "TRIPPED OUT"  [place]'T [place] THE [place] . Atorvastatin     muscle pain  . Hydrocodone     itching  . Tramadol   . Latex Rash  . Zolpidem Other (See Comments)    Sleep walk    CBC Latest Ref Rng & Units 05/13/2020 10/14/2019 04/02/2019  WBC 3.4 - 10.8 x10E3/uL  8.0 8.6 9.2  Hemoglobin 11.1 - 15.9 g/dL 11.1 11.4(L) 11.8(L)  Hematocrit 34.0 - 46.6 %  33.7(L) 33.7(L) 35.3(L)  Platelets 150 - 450 x10E3/uL 153 238 254      CMP     Component Value Date/Time   NA 141 09/24/2020 1303   K 4.6 09/24/2020 1303   CL 101 09/24/2020 1303   CO2 24 09/24/2020 1303   GLUCOSE 106 (H) 09/24/2020 1303   GLUCOSE 72 10/17/2019 0755   BUN 34 (H) 09/24/2020 1303   CREATININE 1.50 (H) 09/24/2020 1303   CREATININE 1.42 (H) 10/14/2019 1142   CALCIUM 10.0 09/24/2020 1303   PROT 6.9 09/24/2020 1303   ALBUMIN 4.4 09/24/2020 1303   AST 15 09/24/2020 1303   ALT 14 09/24/2020 1303   ALKPHOS 100 09/24/2020 1303   BILITOT 0.3 09/24/2020 1303   GFRNONAA 35 (L) 09/24/2020 1303   GFRNONAA 37 (L) 10/14/2019 1142   GFRAA 40 (L) 09/24/2020 1303   GFRAA 43 (L) 10/14/2019 1142     No results found.     Assessment & Plan:   1. Atherosclerosis of native arteries of the extremities with ulceration ([place])  Recommend:  The patient has had a slow healing wound with no evidence of PAD.  The patient has evidence of severe atherosclerotic changes of both lower extremities associated with ulceration and tissue loss of the foot.    Patient should undergo angiography of the left lower extremity with the hope for intervention for limb salvage.  The risks and benefits as well as the alternative therapies was discussed in detail with the patient.  All questions were answered.  Patient agrees to proceed with angiography.  The patient will follow up with me in the office after the procedure.    2. Type 2 diabetes mellitus with other specified complication, with long-term current use of insulin ([name]) Continue hypoglycemic medications as already ordered, these medications have been reviewed and there are no changes at this time.  Hgb A1C to be monitored as already arranged by primary service   3. Dyslipidemia associated with type 2 diabetes mellitus ([place]) Continue statin as  ordered and reviewed, no changes at this time    Current Outpatient Medications on File Prior to Visit  Medication Sig Dispense Refill  . ACCU-CHEK SOFTCLIX LANCETS lancets     . acetaminophen (TYLENOL 8 HOUR) 650 MG CR tablet Take 1 tablet (650 mg total) by mouth every 8 (eight) hours as needed for pain. 90 tablet 2  . albuterol (PROVENTIL) (2.5 MG/3ML) 0.083% nebulizer solution Take 3 mLs (2.5 mg total) by nebulization every 6 (six) hours as needed. 75 mL 2  . albuterol (VENTOLIN HFA) 108 (90 Base) MCG/ACT inhaler every 6 (six) hours as needed.    . Alcohol Swabs (B-D SINGLE USE SWABS REGULAR) PADS     . amLODipine (NORVASC) 5 MG tablet Take 1 tablet (5 mg total) by mouth daily. 90 tablet 1  . ASPIRIN LOW DOSE 81 MG EC tablet TAKE 1 TABLET (81 MG TOTAL) BY MOUTH DAILY. 30 tablet 0  . Blood Glucose Monitoring Suppl (ACCU-CHEK AVIVA PLUS) w/Device KIT     . buPROPion (WELLBUTRIN XL) 150 MG 24 hr tablet Take 1 tablet by mouth daily. 90 tablet 0  . Calcium Carb-Cholecalciferol (CALCIUM 600+D3 PO) Take by mouth.    . Cyanocobalamin (VITAMIN B12 PO) Take 1 tablet by mouth daily.     Marland Kitchen DICLOFENAC PO Take by mouth in the morning and at bedtime.    . DULoxetine (CYMBALTA) 60 MG capsule Take 60 mg by mouth daily.    . furosemide (LASIX)  20 MG tablet Take 1 tablet by mouth daily. 90 tablet 0  . insulin NPH-regular Human (70-30) 100 UNIT/ML injection Inject 90 Units into the skin 2 (two) times daily with a meal.    . irbesartan-hydrochlorothiazide (AVALIDE) 300-12.5 MG tablet Take 1 tablet by mouth daily. 90 tablet 1  . levothyroxine (SYNTHROID) 25 MCG tablet Take 1 tablet by mouth daily except, take 1 and 1/2 tablets by mouth on Sunday. 100 tablet 0  . metFORMIN (GLUCOPHAGE) 500 MG tablet Take 500 mg by mouth daily.    . metoprolol tartrate (LOPRESSOR) 25 MG tablet Take 1 tablet (25 mg total) by mouth 2 (two) times daily. 180 tablet 1  . Misc Natural Products (OSTEO BI-FLEX ADV JOINT SHIELD PO) Take  by mouth 2 (two) times daily.     . montelukast (SINGULAIR) 10 MG tablet Take 1 tablet by mouth at bedtime. 90 tablet 0  . Omega-3 Fatty Acids (FISH OIL) 1000 MG CAPS Take 2 capsules by mouth daily.    Marland Kitchen omeprazole (PRILOSEC) 40 MG capsule Take 1 capsule by mouth daily. 90 capsule 0  . Potassium 99 MG TABS Take by mouth daily at 6 (six) AM.    . pregabalin (LYRICA) 300 MG capsule Take 300 mg by mouth 2 (two) times a day.    . Probiotic Product (PROBIOTIC-10 PO) Take by mouth.    Daryll Brod INSULIN SYRINGE 1ML/31G 31G X 5/16" 1 ML MISC     . rosuvastatin (CRESTOR) 20 MG tablet Take 1 tablet (20 mg total) by mouth daily. 90 tablet 1  . sertraline (ZOLOFT) 100 MG tablet Take 1 and 1/2 tablets by mouth daily. 135 tablet 0  . tiZANidine (ZANAFLEX) 4 MG tablet Take 8 mg by mouth daily with supper.    . traZODone (DESYREL) 50 MG tablet Take 1 tablet (50 mg total) by mouth at bedtime as needed for sleep. 90 tablet 1  . vitamin C (ASCORBIC ACID) 500 MG tablet Take 500 mg by mouth 2 (two) times daily.     Marland Kitchen VITAMIN D, CHOLECALCIFEROL, PO Take 1,000 Units by mouth daily.     . ciprofloxacin (CIPRO) 500 MG tablet Take 1 tablet (500 mg total) by mouth 2 (two) times daily. (Patient not taking: Reported on 11/04/2020) 20 tablet 0  . doxycycline (VIBRA-TABS) 100 MG tablet Take 1 tablet (100 mg total) by mouth 2 (two) times daily. (Patient not taking: Reported on 11/04/2020) 20 tablet 0  . metroNIDAZOLE (METROCREAM) 0.75 % cream Apply topically in the morning and at bedtime. (Patient not taking: Reported on 11/04/2020) 45 g 0  . Miconazole Nitrate 2 % POWD Apply to skin folds twice a day (Patient not taking: Reported on 11/04/2020) 100 g 0  . modafinil (PROVIGIL) 100 MG tablet Take 1 tablet (100 mg total) by mouth daily. (Patient not taking: Reported on 11/04/2020) 90 tablet 0   No current facility-administered medications on file prior to visit.    There are no Patient Instructions on file for this visit. No  follow-ups on file.   Kris Hartmann, NP

## 2020-11-10 ENCOUNTER — Telehealth (INDEPENDENT_AMBULATORY_CARE_PROVIDER_SITE_OTHER): Payer: Self-pay

## 2020-11-10 NOTE — Telephone Encounter (Signed)
Spoke with the patient, she is scheduled with Dr. Delana Meyer for a LLE angio on 11/23/20 with a 9:00 am arrival time to the [facility]. Covid testing on 11/19/20 between 8-1 pm at the [facility]. Pre-procedure instructions were discussed and will mailed.

## 2020-11-14 NOTE — Progress Notes (Signed)
Subjective:  Patient presents today status post partial fifth ray amputation left foot. DOS: 09/02/2020.  Visit on 10/19/2020 the decision was made in office to suture the distal portion of the dehiscence amputation site.  Overall the patient is doing very well today.  Her incision site is healing nicely to the amputation site of the left foot.  Patient does present with a new complaint today regarding her right foot.  Patient states that she has recurrent ulcers that developed intermittently for the last several years to the right second toe.  Most recently she had the ulcer developed and drainage coming from the toe.  She would like to have it evaluated today.  She has not done anything for treatment.  Past Medical History:  Diagnosis Date  . Anemia   . Arthritis   . Asthma   . B12 deficiency   . Back pain   . Carpal tunnel syndrome   . Chronic kidney disease   . Constipation   . Depression   . Depressive disorder   . Diabetes mellitus   . Dyspnea   . Fluid retention   . Foot pain   . GERD (gastroesophageal reflux disease)   . Headache   . History of hiatal hernia   . Hyperlipidemia   . Hypertension   . IBS (irritable bowel syndrome)   . Insomnia   . Joint pain   . Lumbago   . Memory loss   . Obesity   . 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)   . Pneumonia   . Rhinitis, allergic   . Rosacea   . Sleep apnea   . SOB (shortness of breath)   . Thyroid disease   . Unspecified hearing loss   . Unspecified hereditary and idiopathic peripheral neuropathy   . Unspecified sleep apnea   . Vitamin D deficiency    Objective: Physical Exam General: The patient is alert and oriented x3 in no acute distress.  Dermatology: Sutures are intact.  It appears to be healing and very well coapted.  No drainage.  No erythema.  No edema noted  In regards to the right second toe, there is increased erythema and edema to the distal end of the right second toe.  Superficial ulcer noted with draining  tophaceous gout.  With compression of the DIPJ tophaceous gout is expressed.  Vascular: Palpable pedal pulses bilaterally.  Negative for any significant edema or erythema to the foot except for the mentioned erythema to the distal end of the right second toe.. Capillary refill within normal limits.  Neurological: Epicritic and protective threshold diminished bilaterally.   Musculoskeletal Exam: Partial fifth ray amputation left foot.  No other pedal deformity  Assessment: 1. s/p partial fifth ray amputation left foot. DOS: 09/02/2020 2.  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 Patient was evaluated.  In regards to the left foot amputation site she is doing very well and she is healed.  Complete reepithelialization of the amputation site has occurred and she may resume full activity no restrictions 2.  In regards to the right second toe chronic gout, she has had intermittent ulcers develop over the distal tip of the toe.  Currently tophaceous gout was expressed from the area.  I recommend DIPJ arthroplasty to correct tophaceous gout of the toe.  3. Today we discussed the conservative versus surgical management of the presenting pathology. The patient opts for surgical management. All possible complications and details of the procedure were  explained. All patient questions were answered. No guarantees were expressed or implied. 3. Authorization for surgery was initiated today. Surgery will consist of DIPJ arthroplasty right second toe 4.  Return to clinic 1 week postop   Edrick Kins, DPM [facility]  Dr. Edrick Kins, DPM    [address], [place] [address]                Office [phone]  Fax [phone]

## 2020-11-18 ENCOUNTER — Other Ambulatory Visit: Payer: Self-pay

## 2020-11-18 ENCOUNTER — Ambulatory Visit
Admission: RE | Admit: 2020-11-18 | Discharge: 2020-11-18 | Disposition: A | Discharge status: Home / Self Care | Payer: Medicare HMO | Source: Ambulatory Visit | Attending: Family Medicine | Admitting: Family Medicine

## 2020-11-18 DIAGNOSIS — Z1231 Encounter for screening mammogram for malignant neoplasm of breast: Secondary | ICD-10-CM | POA: Diagnosis not present

## 2020-11-19 ENCOUNTER — Other Ambulatory Visit
Admission: RE | Admit: 2020-11-19 | Discharge: 2020-11-19 | Disposition: A | Discharge status: Home / Self Care | Payer: Medicare HMO | Source: Ambulatory Visit | Attending: Vascular Surgery | Admitting: Vascular Surgery

## 2020-11-19 DIAGNOSIS — Z20822 Contact with and (suspected) exposure to covid-19: Secondary | ICD-10-CM | POA: Diagnosis not present

## 2020-11-19 DIAGNOSIS — Z01812 Encounter for preprocedural laboratory examination: Secondary | ICD-10-CM | POA: Diagnosis not present

## 2020-11-20 LAB — SARS CORONAVIRUS 2 (TAT 6-24 HRS): SARS Coronavirus 2: NEGATIVE

## 2020-11-22 ENCOUNTER — Other Ambulatory Visit: Payer: Self-pay

## 2020-11-22 ENCOUNTER — Other Ambulatory Visit (INDEPENDENT_AMBULATORY_CARE_PROVIDER_SITE_OTHER): Payer: Self-pay | Admitting: Nurse Practitioner

## 2020-11-22 DIAGNOSIS — F331 Major depressive disorder, recurrent, moderate: Secondary | ICD-10-CM

## 2020-11-22 DIAGNOSIS — E039 Hypothyroidism, unspecified: Secondary | ICD-10-CM

## 2020-11-22 MED ORDER — LEVOTHYROXINE SODIUM 25 MCG PO TABS
ORAL_TABLET | ORAL | 0 refills | Status: DC
Start: 2020-11-22 — End: 2021-01-28

## 2020-11-23 ENCOUNTER — Other Ambulatory Visit: Payer: Self-pay | Admitting: Family Medicine

## 2020-11-23 ENCOUNTER — Encounter: Payer: Self-pay | Admitting: Vascular Surgery

## 2020-11-23 ENCOUNTER — Encounter
Admission: RE | Disposition: A | Discharge status: Home / Self Care | Payer: Self-pay | Source: Home / Self Care | Attending: Vascular Surgery

## 2020-11-23 ENCOUNTER — Telehealth: Payer: Self-pay | Admitting: Urology

## 2020-11-23 ENCOUNTER — Ambulatory Visit
Admission: RE | Admit: 2020-11-23 | Discharge: 2020-11-23 | Disposition: A | Discharge status: Home / Self Care | Payer: Medicare HMO | Attending: Vascular Surgery | Admitting: Vascular Surgery

## 2020-11-23 ENCOUNTER — Other Ambulatory Visit: Payer: Self-pay

## 2020-11-23 DIAGNOSIS — I70245 Atherosclerosis of native arteries of left leg with ulceration of other part of foot: Secondary | ICD-10-CM | POA: Insufficient documentation

## 2020-11-23 DIAGNOSIS — L710 Perioral dermatitis: Secondary | ICD-10-CM

## 2020-11-23 DIAGNOSIS — Z885 Allergy status to narcotic agent status: Secondary | ICD-10-CM | POA: Diagnosis not present

## 2020-11-23 DIAGNOSIS — E1142 Type 2 diabetes mellitus with diabetic polyneuropathy: Secondary | ICD-10-CM | POA: Diagnosis not present

## 2020-11-23 DIAGNOSIS — E1151 Type 2 diabetes mellitus with diabetic peripheral angiopathy without gangrene: Secondary | ICD-10-CM | POA: Insufficient documentation

## 2020-11-23 DIAGNOSIS — R208 Other disturbances of skin sensation: Secondary | ICD-10-CM

## 2020-11-23 DIAGNOSIS — Z888 Allergy status to other drugs, medicaments and biological substances status: Secondary | ICD-10-CM | POA: Diagnosis not present

## 2020-11-23 DIAGNOSIS — E11621 Type 2 diabetes mellitus with foot ulcer: Secondary | ICD-10-CM | POA: Insufficient documentation

## 2020-11-23 DIAGNOSIS — L97528 Non-pressure chronic ulcer of other part of left foot with other specified severity: Secondary | ICD-10-CM | POA: Diagnosis not present

## 2020-11-23 DIAGNOSIS — Z91040 Latex allergy status: Secondary | ICD-10-CM | POA: Insufficient documentation

## 2020-11-23 DIAGNOSIS — I70299 Other atherosclerosis of native arteries of extremities, unspecified extremity: Secondary | ICD-10-CM

## 2020-11-23 DIAGNOSIS — I70248 Atherosclerosis of native arteries of left leg with ulceration of other part of lower left leg: Secondary | ICD-10-CM | POA: Diagnosis not present

## 2020-11-23 DIAGNOSIS — L97909 Non-pressure chronic ulcer of unspecified part of unspecified lower leg with unspecified severity: Secondary | ICD-10-CM

## 2020-11-23 HISTORY — PX: LOWER EXTREMITY ANGIOGRAPHY: CATH118251

## 2020-11-23 LAB — BUN: BUN: 45 mg/dL — ABNORMAL HIGH (ref 8–23)

## 2020-11-23 LAB — CREATININE, SERUM
Creatinine, Ser: 1.21 mg/dL — ABNORMAL HIGH (ref 0.44–1.00)
GFR, Estimated: 48 mL/min — ABNORMAL LOW (ref >60)

## 2020-11-23 LAB — GLUCOSE, CAPILLARY
Glucose-Capillary: 162 mg/dL — ABNORMAL HIGH (ref 70–99)
Glucose-Capillary: 150 mg/dL — ABNORMAL HIGH (ref 70–99)

## 2020-11-23 SURGERY — LOWER EXTREMITY ANGIOGRAPHY
Anesthesia: Moderate Sedation | Site: Leg Lower | Laterality: Left

## 2020-11-23 MED ORDER — SODIUM CHLORIDE 0.9% FLUSH: 3.0000 mL | INTRAVENOUS | Status: DC | PRN

## 2020-11-23 MED ORDER — FAMOTIDINE 20 MG PO TABS: 40.0000 mg | ORAL_TABLET | Freq: Once | ORAL | Status: DC | PRN

## 2020-11-23 MED ORDER — HYDRALAZINE HCL 20 MG/ML IJ SOLN: 5.0000 mg | INTRAMUSCULAR | Status: DC | PRN

## 2020-11-23 MED ORDER — DIPHENHYDRAMINE HCL 50 MG/ML IJ SOLN: 50.0000 mg | Freq: Once | INTRAMUSCULAR | Status: DC | PRN

## 2020-11-23 MED ORDER — LABETALOL HCL 5 MG/ML IV SOLN: 10.0000 mg | INTRAVENOUS | Status: DC | PRN

## 2020-11-23 MED ORDER — MICONAZOLE NITRATE 2 % POWD: 2 refills | Status: DC

## 2020-11-23 MED ORDER — SODIUM CHLORIDE 0.9 % IV SOLN: INTRAVENOUS | Status: DC

## 2020-11-23 MED ORDER — MIDAZOLAM HCL 5 MG/5ML IJ SOLN
INTRAMUSCULAR | Status: AC
  Filled 2020-11-23: qty 5

## 2020-11-23 MED ORDER — FENTANYL CITRATE (PF) 100 MCG/2ML IJ SOLN
12.5000 ug | Freq: Once | INTRAMUSCULAR | Status: DC | PRN
Start: 2020-11-23 — End: 2020-11-23

## 2020-11-23 MED ORDER — ONDANSETRON HCL 4 MG/2ML IJ SOLN: 4.0000 mg | Freq: Four times a day (QID) | INTRAMUSCULAR | Status: DC | PRN

## 2020-11-23 MED ORDER — CEFAZOLIN SODIUM-DEXTROSE 2-4 GM/100ML-% IV SOLN: 2.0000 g | Freq: Once | INTRAVENOUS | Status: AC

## 2020-11-23 MED ORDER — SODIUM CHLORIDE 0.9 % IV SOLN: 250.0000 mL | INTRAVENOUS | Status: DC | PRN

## 2020-11-23 MED ORDER — OXYCODONE HCL 5 MG PO TABS: 5.0000 mg | ORAL_TABLET | ORAL | Status: DC | PRN

## 2020-11-23 MED ORDER — FENTANYL CITRATE (PF) 100 MCG/2ML IJ SOLN
INTRAMUSCULAR | Status: AC
  Filled 2020-11-23: qty 2

## 2020-11-23 MED ORDER — CEFAZOLIN SODIUM-DEXTROSE 2-4 GM/100ML-% IV SOLN
INTRAVENOUS | Status: AC
  Administered 2020-11-23: 2 g via INTRAVENOUS
  Filled 2020-11-23: qty 100

## 2020-11-23 MED ORDER — ACETAMINOPHEN 325 MG PO TABS: 650.0000 mg | ORAL_TABLET | ORAL | Status: DC | PRN

## 2020-11-23 MED ORDER — MORPHINE SULFATE (PF) 4 MG/ML IV SOLN: 2.0000 mg | INTRAVENOUS | Status: DC | PRN

## 2020-11-23 MED ORDER — MIDAZOLAM HCL 2 MG/ML PO SYRP: 8.0000 mg | ORAL_SOLUTION | Freq: Once | ORAL | Status: DC | PRN

## 2020-11-23 MED ORDER — MIDAZOLAM HCL 2 MG/2ML IJ SOLN
INTRAMUSCULAR | Status: DC | PRN
  Administered 2020-11-23: 2 mg via INTRAVENOUS
  Administered 2020-11-23: 1 mg via INTRAVENOUS

## 2020-11-23 MED ORDER — IODIXANOL 320 MG/ML IV SOLN
INTRAVENOUS | Status: DC | PRN
  Administered 2020-11-23: 40 mL

## 2020-11-23 MED ORDER — METHYLPREDNISOLONE SODIUM SUCC 125 MG IJ SOLR: 125.0000 mg | Freq: Once | INTRAMUSCULAR | Status: DC | PRN

## 2020-11-23 MED ORDER — FENTANYL CITRATE (PF) 100 MCG/2ML IJ SOLN
INTRAMUSCULAR | Status: DC | PRN
  Administered 2020-11-23: 25 ug via INTRAVENOUS
  Administered 2020-11-23: 50 ug via INTRAVENOUS

## 2020-11-23 MED ORDER — SODIUM CHLORIDE 0.9% FLUSH: 3.0000 mL | Freq: Two times a day (BID) | INTRAVENOUS | Status: DC

## 2020-11-23 SURGICAL SUPPLY — 11 items
CANNULA 5F STIFF (CANNULA) ×2 IMPLANT
CATH ANGIO 5F PIGTAIL 65CM (CATHETERS) ×2 IMPLANT
CATH TEMPO 5F RIM 65CM (CATHETERS) ×2 IMPLANT
COVER PROBE U/S 5X48 (MISCELLANEOUS) ×2 IMPLANT
DEVICE STARCLOSE SE CLOSURE (Vascular Products) ×2 IMPLANT
GLIDEWIRE ADV .035X260CM (WIRE) ×2 IMPLANT
PACK ANGIOGRAPHY (CUSTOM PROCEDURE TRAY) ×2 IMPLANT
SHEATH BRITE TIP 5FRX11 (SHEATH) ×4 IMPLANT
SYR MEDRAD MARK 7 150ML (SYRINGE) ×2 IMPLANT
TUBING CONTRAST HIGH PRESS 72 (TUBING) ×2 IMPLANT
WIRE GUIDERIGHT .035X150 (WIRE) ×2 IMPLANT

## 2020-11-23 NOTE — Telephone Encounter (Signed)
Requested medication (s) are due for refill today: yes  Requested medication (s) are on the active medication list: yes  Last refill:  10/17/19  Future visit scheduled: yes  Notes to clinic:  no refill protocol for this medication   Requested Prescriptions  Pending Prescriptions Disp Refills   Miconazole Nitrate 2 % POWD 100 g     Sig: Apply to skin folds twice a day      There is no refill protocol information for this order     Refused Prescriptions Disp Refills   metroNIDAZOLE (METROCREAM) 0.75 % cream 45 g     Sig: Apply topically in the morning and at bedtime.      Off-Protocol Failed - 11/23/2020  3:25 PM      Failed - Medication not assigned to a protocol, review manually.      Passed - Valid encounter within last 12 months    Recent Outpatient Visits           2 months ago Dyslipidemia associated with type 2 diabetes mellitus [facility])   [facility] Steele Sizer, MD   3 months ago Cough   [facility] Lebron Conners D, MD   5 months ago Need for influenza vaccination   [facility] Steele Sizer, MD   8 months ago Dyslipidemia associated with type 2 diabetes mellitus [facility])   [facility] Steele Sizer, MD   10 months ago Adult hypothyroidism   [facility] Steele Sizer, MD       Future Appointments             In 1 month Ralene Bathe, MD [place]   In 2 months Steele Sizer, MD [facility], [place]   In 9 months  [facility], [facility]

## 2020-11-23 NOTE — Telephone Encounter (Signed)
Medication Refill - Medication: metroNIDAZOLE (METROCREAM) 0.75 % cream  Miconazole Nitrate 2 % POWD    Preferred Pharmacy (with phone number or street name):  [place], [place] - [place] Phone:  [phone]  Fax:  [phone]       Agent: Please be advised that RX refills may take up to 3 business days. We ask that you follow-up with your pharmacy.

## 2020-11-23 NOTE — Progress Notes (Signed)
Dr. Delana Meyer at bedside, speaking with pt. Re: procedural results. Pt. And friend both verbalize understading of conversation.

## 2020-11-23 NOTE — Op Note (Signed)
[facility]  Percutaneous Study/Intervention Procedural Note   Date of Surgery: 11/23/2020,11:26 AM  Surgeon:Stashia Sia, Dolores Lory   Pre-operative Diagnosis: Atherosclerotic occlusive disease bilateral lower extremities with ulceration of the left foot  Post-operative diagnosis:  Same  Procedure(s) Performed:  1.  Abdominal aortogram  2.  Left lower extremity angiography third order catheter placement  3.  Star close right common femoral   Anesthesia: Conscious sedation was administered by the interventional radiology RN under my direct supervision. IV Versed plus fentanyl were utilized. Continuous ECG, pulse oximetry and blood pressure was monitored throughout the entire procedure.  Conscious sedation was administered for a total of 42 minutes and 47 seconds. minutes.  Sheath: 5 French Pinnacle 11 cm retrograde right common femoral  Contrast: 40 cc   Fluoroscopy Time: 27 minutes  Indications:  The patient presents to [facility] with ulcerative changes to the left foot.  Pedal pulses are nonpalpable bilaterally suggesting atherosclerotic occlusive disease and ABI demonstrates mild to moderate disease on the left.  The risks and benefits as well as alternative therapies for lower extremity revascularization are reviewed with the patient all questions are answered the patient agrees to proceed.  The patient is therefore undergoing angiography with the hope for intervention for limb salvage.   Procedure:  Summer [name] a 70 y.o. female who was identified and appropriate procedural time out was performed.  The patient was then placed supine on the table and prepped and draped in the usual sterile fashion.  Ultrasound was used to evaluate the right common femoral artery.  It was echolucent and pulsatile indicating it is patent .  An ultrasound image was acquired for the permanent record.  A micropuncture needle was used to access the right common femoral artery  under direct ultrasound guidance.  The microwire was then advanced under fluoroscopic guidance without difficulty followed by the micro-sheath.  A 0.035 J wire was advanced without resistance and a 5Fr sheath was placed.    Pigtail catheter was then advanced to the level of T12 and AP projection of the aorta was obtained. Pigtail catheter was then repositioned to above the bifurcation and RAO view of the pelvis was obtained.  Advantage Glidewire and rim catheter was then used across the bifurcation and the catheter was positioned in the distal external iliac artery.  LAO of the left groin was then obtained. Wire was reintroduced and negotiated into the SFA and the catheter was advanced into the SFA. Distal runoff was then performed.  After review of the images the catheter was removed over wire and an right view of the groin was obtained. StarClose device was deployed without difficulty.   Findings:   Aortogram: The abdominal aorta is opacified with a bolus injection contrast.  There are single renal arteries.  There does not appear to be any hemodynamically significant renal artery stenosis.  The infrarenal aorta demonstrates a significant increase in the diffuse disease with some tapering but this does not appear to be greater than 40% in any 1 location and is largely less than 20% throughout this diseased segment.  At the aortic bifurcation the arteries appear to normalize somewhat and the bilateral common and external iliac arteries are smooth and free of hemodynamically significant stenoses.  They are widely patent.  Right Lower Extremity: Common femoral is widely patent.  Visualized portions of the SFA and profunda femoris are patent.  Left Lower Extremity: The left common femoral profunda femoris superficial femoral and popliteal arteries demonstrate mild  atherosclerotic changes there are no focal hemodynamically significant lesions.  The trifurcation is patent however the anterior tibial occludes  shortly after its origin and remains occluded down to the ankle.  The tibioperoneal trunk is widely patent.  The posterior tibial is the dominant runoff to the foot and is widely patent filling the medial and lateral plantar arteries and the pedal arch.  The peroneal is of good size and widely patent down to the ankle where a large collateral fills the dorsalis pedis.  Summary: The patient has moderate diffuse atherosclerotic changes of the infrarenal aorta otherwise her left lower extremity is widely patent with good 2 vessel runoff to the foot.  She does have occlusion of her anterior tibial but this should be insignificant given her patent tibial runoff.  I would continue wound care and observation at this time I do not believe any intervention is warranted.  Should concerns about wound healing persist then a CT angio of the abdomen and pelvis could be obtained to more exactly quantify the infrarenal aorta.  However I do not believe this will be necessary.  Disposition: Patient was taken to the recovery room in stable condition having tolerated the procedure well.  Summer [name] 11/23/2020,11:26 AM

## 2020-11-23 NOTE — Telephone Encounter (Signed)
DOS: 12/09/20  HAMM[place] REPAIR 2ND RIGHT -- [id number]  PER [employer]ITE [facility] INSURANCE) CPT CODE 98421 WAS APPROVED, Authorization #[id number] AND IS GOOD FROM 12/09/20-01/08/21.

## 2020-11-23 NOTE — Progress Notes (Signed)
GRF : 48 today. Spoke with Dr. Delana Meyer in person: orders received to increase NS to 125 ml/hr. See MAR.

## 2020-11-23 NOTE — Interval H&P Note (Signed)
History and Physical Interval Note:  11/23/2020 10:12 AM  Summer [name]  has presented today for surgery, with the diagnosis of LT lower extremity angio  BARD   ASO w ulceration  Covid  March 11.  The various methods of treatment have been discussed with the patient and family. After consideration of risks, benefits and other options for treatment, the patient has consented to  Procedure(s): LOWER EXTREMITY ANGIOGRAPHY (Left) as a surgical intervention.  The patient's history has been reviewed, patient examined, no change in status, stable for surgery.  I have reviewed the patient's chart and labs.  Questions were answered to the patient's satisfaction.     Hortencia Pilar

## 2020-11-23 NOTE — Telephone Encounter (Signed)
Requested Prescriptions  Pending Prescriptions Disp Refills  . metroNIDAZOLE (METROCREAM) 0.75 % cream 45 g     Sig: Apply topically in the morning and at bedtime.     Off-Protocol Failed - 11/23/2020  3:25 PM      Failed - Medication not assigned to a protocol, review manually.      Passed - Valid encounter within last 12 months    Recent Outpatient Visits          2 months ago Dyslipidemia associated with type 2 diabetes mellitus [facility])   [facility] Steele Sizer, MD   3 months ago Cough   [facility] Lebron Conners D, MD   5 months ago Need for influenza vaccination   [facility] Steele Sizer, MD   8 months ago Dyslipidemia associated with type 2 diabetes mellitus [facility])   [facility] Steele Sizer, MD   10 months ago Adult hypothyroidism   [facility] Steele Sizer, MD      Future Appointments            In 1 month Ralene Bathe, MD [place]   In 2 months Steele Sizer, MD [facility], [place]   In 9 months  [facility], [place]           . Miconazole Nitrate 2 % POWD 100 g     Sig: Apply to skin folds twice a day     There is no refill protocol information for this order

## 2020-11-29 ENCOUNTER — Other Ambulatory Visit: Payer: Self-pay

## 2020-12-03 DIAGNOSIS — I739 Peripheral vascular disease, unspecified: Secondary | ICD-10-CM | POA: Insufficient documentation

## 2020-12-03 DIAGNOSIS — I7025 Atherosclerosis of native arteries of other extremities with ulceration: Secondary | ICD-10-CM | POA: Insufficient documentation

## 2020-12-03 NOTE — Progress Notes (Signed)
MRN : [id number]  Summer [name] is a 69 y.o. (1950-10-17) female who presents with chief complaint of No chief complaint on file. Marland Kitchen  History of Present Illness:   The patient returns to the office for followup and review status post angiogram without intervention on 11/23/2020.   She was found to have mild diffuse ASO with two vessel tibial runoff to the foot.  The patient notes improvement in the lower extremity symptoms. No interval shortening of the patient's claudication distance or rest pain symptoms.  No new ulcers or wounds have occurred since the last visit.  There have been no significant changes to the patient's overall health care.  The patient denies amaurosis fugax or recent TIA symptoms. There are no recent neurological changes noted. The patient denies history of DVT, PE or superficial thrombophlebitis. The patient denies recent episodes of angina or shortness of breath.    No outpatient medications have been marked as taking for the 12/06/20 encounter (Appointment) with Delana Meyer, Dolores Lory, MD.    Past Medical History:  Diagnosis Date  . Anemia   . Arthritis   . Asthma   . B12 deficiency   . Back pain   . Carpal tunnel syndrome   . Chronic kidney disease   . Constipation   . Depression   . Depressive disorder   . Diabetes mellitus   . Dyspnea   . Fluid retention   . Foot pain   . GERD (gastroesophageal reflux disease)   . Headache   . History of hiatal hernia   . Hyperlipidemia   . Hypertension   . IBS (irritable bowel syndrome)   . Insomnia   . Joint pain   . Lumbago   . Memory loss   . Obesity   . 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)   . Pneumonia   . Rhinitis, allergic   . Rosacea   . Sleep apnea   . SOB (shortness of breath)   . Thyroid disease   . Unspecified hearing loss   . Unspecified hereditary and idiopathic peripheral neuropathy   . Unspecified sleep apnea   . Vitamin D deficiency     Past Surgical History:  Procedure Laterality Date   . ABDOMINAL HYSTERECTOMY  1975  . ANKLE SURGERY Left approx Jan 2018  . APPENDECTOMY  1970  . CATARACT EXTRACTION  01/2011   right  . COLONOSCOPY WITH PROPOFOL N/A 08/19/2019   Procedure: COLONOSCOPY WITH PROPOFOL;  Surgeon: Jonathon Bellows, MD;  Location: [facility] ENDOSCOPY;  Service: Gastroenterology;  Laterality: N/A;  . eye lid surgery  2013   bilateral  . FOOT SURGERY    . LOWER EXTREMITY ANGIOGRAPHY Left 11/23/2020   Procedure: LOWER EXTREMITY ANGIOGRAPHY;  Surgeon: Katha Cabal, MD;  Location: [place] CV LAB;  Service: Cardiovascular;  Laterality: Left;  . NECK SURGERY    . SPINE SURGERY    . TUBAL LIGATION    . VAGINAL HYSTERECTOMY  1989    Social History Social History   Tobacco Use  . Smoking status: Never Smoker  . Smokeless tobacco: Never Used  Vaping Use  . Vaping Use: Never used  Substance Use Topics  . Alcohol use: No    Alcohol/week: 0.0 standard drinks  . Drug use: No    Family History Family History  Problem Relation Age of Onset  . Aneurysm Mother   . Aortic aneurysm Mother   . Hypertension Mother   . Hyperlipidemia Mother   . Heart disease Mother   .  Obesity Mother   . Heart attack Maternal Grandfather   . Diabetes Maternal Grandfather     Allergies  Allergen Reactions  . Codeine Other (See Comments)    "TRIPPED OUT"  DIDN'T LIKE THE [place]  . Atorvastatin     muscle pain  . Hydrocodone     itching  . Tramadol   . Latex Rash  . Zolpidem Other (See Comments)    Sleep walk     REVIEW OF SYSTEMS (Negative unless checked)  Constitutional: [] Weight loss  [] Fever  [] Chills Cardiac: [] Chest pain   [] Chest pressure   [] Palpitations   [] Shortness of breath when laying flat   [] Shortness of breath with exertion. Vascular:  [x] Pain in legs with walking   [] Pain in legs at rest  [] History of DVT   [] Phlebitis   [] Swelling in legs   [] Varicose veins   [x] Non-healing ulcers Pulmonary:   [] Uses home oxygen   [] Productive cough    [] Hemoptysis   [] Wheeze  [] COPD   [] Asthma Neurologic:  [] Dizziness   [] Seizures   [] History of stroke   [] History of TIA  [] Aphasia   [] Vissual changes   [] Weakness or numbness in arm   [] Weakness or numbness in leg Musculoskeletal:   [] Joint swelling   [] Joint pain   [] Low back pain Hematologic:  [] Easy bruising  [] Easy bleeding   [] Hypercoagulable state   [] Anemic Gastrointestinal:  [] Diarrhea   [] Vomiting  [x] Gastroesophageal reflux/heartburn   [] Difficulty swallowing. Genitourinary:  [] Chronic kidney disease   [] Difficult urination  [] Frequent urination   [] Blood in urine Skin:  [] Rashes   [x] Ulcers  Psychological:  [] History of anxiety   []  History of major depression.  Physical Examination  There were no vitals filed for this visit. There is no height or weight on file to calculate BMI. Gen: WD/WN, NAD Head: [place]/AT, No temporalis wasting.  Ear/Nose/Throat: Hearing grossly intact, nares w/o erythema or drainage Eyes: PER, EOMI, sclera nonicteric.  Neck: Supple, no large masses.   Pulmonary:  Good air movement, no audible wheezing bilaterally, no use of accessory muscles.  Cardiac: RRR, no JVD Vascular:  Left foot ulcer clean without cellulitis Vessel Right Left  Radial Palpable Palpable  PT Palpable Palpable  DP Palpable Not Palpable  Gastrointestinal: Non-distended. No guarding/no peritoneal signs.  Musculoskeletal: M/S 5/5 throughout.  No deformity or atrophy.  Neurologic: CN 2-12 intact. Symmetrical.  Speech is fluent. Motor exam as listed above. Psychiatric: Judgment intact, Mood & affect appropriate for pt's clinical situation. Dermatologic: No rashes or ulcers noted.  No changes consistent with cellulitis. Lymph : No lichenification or skin changes of chronic lymphedema.  CBC Lab Results  Component Value Date   WBC 8.0 05/13/2020   HGB 11.1 05/13/2020   HCT 33.7 (L) 05/13/2020   MCV 87 05/13/2020   PLT 153 05/13/2020    BMET    Component Value Date/Time   NA  141 09/24/2020 1303   K 4.6 09/24/2020 1303   CL 101 09/24/2020 1303   CO2 24 09/24/2020 1303   GLUCOSE 106 (H) 09/24/2020 1303   GLUCOSE 72 10/17/2019 0755   BUN 45 (H) 11/23/2020 0937   BUN 34 (H) 09/24/2020 1303   CREATININE 1.21 (H) 11/23/2020 0937   CREATININE 1.42 (H) 10/14/2019 1142   CALCIUM 10.0 09/24/2020 1303   GFRNONAA 48 (L) 11/23/2020 0937   GFRNONAA 37 (L) 10/14/2019 1142   GFRAA 40 (L) 09/24/2020 1303   GFRAA 43 (L) 10/14/2019 1142   Estimated Creatinine Clearance:  49.5 mL/min (A) (by C-G formula based on SCr of 1.21 mg/dL (H)).  COAG Lab Results  Component Value Date   INR 1.0 04/02/2019    Radiology PERIPHERAL VASCULAR CATHETERIZATION  Result Date: 11/23/2020 See Op Note  DG Foot Complete Right  Result Date: 11/05/2020 Please see detailed radiograph report in office note.  MM 3D SCREEN BREAST BILATERAL  Result Date: 11/22/2020 CLINICAL DATA:  Screening. EXAM: DIGITAL SCREENING BILATERAL MAMMOGRAM WITH TOMOSYNTHESIS AND CAD TECHNIQUE: Bilateral screening digital craniocaudal and mediolateral oblique mammograms were obtained. Bilateral screening digital breast tomosynthesis was performed. The images were evaluated with computer-aided detection. COMPARISON:  Previous exam(s). ACR Breast Density Category b: There are scattered areas of fibroglandular density. FINDINGS: There are no findings suspicious for malignancy. The images were evaluated with computer-aided detection. IMPRESSION: No mammographic evidence of malignancy. A result letter of this screening mammogram will be mailed directly to the patient. RECOMMENDATION: Screening mammogram in one year. (Code:SM-B-01Y) BI-RADS CATEGORY  1: Negative. Electronically Signed   By: Franki Cabot M.D.   On: 11/22/2020 09:24   VAS [place] ABI WITH/WO TBI  Result Date: 11/08/2020 LOWER EXTREMITY DOPPLER STUDY Indications: Rest pain, and ulceration.  Performing Technologist: Charlane Ferretti RT (R)(VS)  Examination Guidelines:  A complete evaluation includes at minimum, Doppler waveform signals and systolic blood pressure reading at the level of bilateral brachial, anterior tibial, and posterior tibial arteries, when vessel segments are accessible. Bilateral testing is considered an integral part of a complete examination. Photoelectric Plethysmograph (PPG) waveforms and toe systolic pressure readings are included as required and additional duplex testing as needed. Limited examinations for reoccurring indications may be performed as noted.  ABI Findings: +---------+------------------+-----+---------+--------+ Right    Rt Pressure (mmHg)IndexWaveform Comment  +---------+------------------+-----+---------+--------+ Brachial 151                                      +---------+------------------+-----+---------+--------+ ATA      132               0.85 triphasic         +---------+------------------+-----+---------+--------+ PTA      142               0.91 triphasic         +---------+------------------+-----+---------+--------+ Great Toe106               0.68 Normal            +---------+------------------+-----+---------+--------+ +---------+------------------+-----+---------+-------+ Left     Lt Pressure (mmHg)IndexWaveform Comment +---------+------------------+-----+---------+-------+ Brachial 156                                     +---------+------------------+-----+---------+-------+ ATA      138               0.88 triphasic        +---------+------------------+-----+---------+-------+ PTA      131               0.84 biphasic         +---------+------------------+-----+---------+-------+ Great Toe165               1.06 Abnormal         +---------+------------------+-----+---------+-------+ Summary: Right: Resting right ankle-brachial index indicates mild right lower extremity arterial disease. The right toe-brachial index is abnormal. Left: Resting left ankle-brachial index  indicates mild left  lower extremity arterial disease. The left toe-brachial index is normal. *See table(s) above for measurements and observations.  Electronically signed by Hortencia Pilar MD on 11/08/2020 at 5:20:20 PM.   Final      Assessment/Plan 1. Atherosclerosis of native arteries of the extremities with ulceration ([place]) Recommend:  The patient is status post successful angiogram with intervention.  The patient reports that the claudication symptoms and leg pain is essentially gone.   The patient denies lifestyle limiting changes at this point in time.  No further invasive studies, angiography or surgery at this time The patient should continue walking and begin a more formal exercise program.  The patient should continue antiplatelet therapy and aggressive treatment of the lipid abnormalities  Smoking cessation was again discussed  The patient should continue wearing graduated compression socks 10-15 mmHg strength to control the mild edema.  Patient should undergo noninvasive studies as ordered. The patient will follow up with me after the studies.   - VAS [place] ABI WITH/WO TBI; Future  2. Hypertension associated with diabetes ([place]) Continue antihypertensive medications as already ordered, these medications have been reviewed and there are no changes at this time.   3. Diabetic polyneuropathy associated with type 2 diabetes mellitus ([place]) Continue hypoglycemic medications as already ordered, these medications have been reviewed and there are no changes at this time.  Hgb A1C to be monitored as already arranged by primary service   4. Dyslipidemia associated with type 2 diabetes mellitus ([place]) Continue statin as ordered and reviewed, no changes at this time   5. Gastro-esophageal reflux disease without esophagitis Continue PPI as already ordered, this medication has been reviewed and there are no changes at this time.  Avoidence of caffeine and alcohol  Moderate elevation  of the head of the bed    Hortencia Pilar, MD  12/03/2020 3:53 PM

## 2020-12-06 ENCOUNTER — Encounter (INDEPENDENT_AMBULATORY_CARE_PROVIDER_SITE_OTHER): Payer: Self-pay | Admitting: Vascular Surgery

## 2020-12-06 ENCOUNTER — Other Ambulatory Visit: Payer: Self-pay

## 2020-12-06 ENCOUNTER — Ambulatory Visit (INDEPENDENT_AMBULATORY_CARE_PROVIDER_SITE_OTHER): Payer: Medicare HMO | Admitting: Vascular Surgery

## 2020-12-06 VITALS — BP 121/62 | HR 70 | Ht 62.0 in | Wt 242.0 lb

## 2020-12-06 DIAGNOSIS — E1142 Type 2 diabetes mellitus with diabetic polyneuropathy: Secondary | ICD-10-CM | POA: Diagnosis not present

## 2020-12-06 DIAGNOSIS — K219 Gastro-esophageal reflux disease without esophagitis: Secondary | ICD-10-CM | POA: Diagnosis not present

## 2020-12-06 DIAGNOSIS — E785 Hyperlipidemia, unspecified: Secondary | ICD-10-CM | POA: Diagnosis not present

## 2020-12-06 DIAGNOSIS — I152 Hypertension secondary to endocrine disorders: Secondary | ICD-10-CM

## 2020-12-06 DIAGNOSIS — E1169 Type 2 diabetes mellitus with other specified complication: Secondary | ICD-10-CM | POA: Diagnosis not present

## 2020-12-06 DIAGNOSIS — E1159 Type 2 diabetes mellitus with other circulatory complications: Secondary | ICD-10-CM

## 2020-12-06 DIAGNOSIS — I7025 Atherosclerosis of native arteries of other extremities with ulceration: Secondary | ICD-10-CM | POA: Diagnosis not present

## 2020-12-08 ENCOUNTER — Other Ambulatory Visit: Payer: Self-pay | Admitting: Family Medicine

## 2020-12-08 DIAGNOSIS — F331 Major depressive disorder, recurrent, moderate: Secondary | ICD-10-CM

## 2020-12-08 MED ORDER — BUPROPION HCL ER (XL) 150 MG PO TB24: 150.0000 mg | ORAL_TABLET | Freq: Every day | ORAL | 0 refills | Status: DC

## 2020-12-08 NOTE — Telephone Encounter (Signed)
Copied from [place] [id number]. Topic: Quick Communication - Rx Refill/Question >> Dec 08, 2020  5:21 PM Mcneil, Ja-Kwan wrote: Medication: buPROPion (WELLBUTRIN XL) 150 MG 24 hr tablet  Has the patient contacted their pharmacy? yes   Preferred Pharmacy (with phone number or street name): [facility] [place], [place]  Phone: [phone]   Fax: [phone]  Agent: Please be advised that RX refills may take up to 3 business days. We ask that you follow-up with your pharmacy.

## 2020-12-08 NOTE — Telephone Encounter (Signed)
Future visit in 1 month  

## 2020-12-09 ENCOUNTER — Encounter: Payer: Self-pay | Admitting: Podiatry

## 2020-12-09 ENCOUNTER — Other Ambulatory Visit: Payer: Self-pay | Admitting: Podiatry

## 2020-12-09 DIAGNOSIS — M2041 Other hammer toe(s) (acquired), right foot: Secondary | ICD-10-CM | POA: Diagnosis not present

## 2020-12-09 MED ORDER — DOXYCYCLINE HYCLATE 100 MG PO TABS: 100.0000 mg | ORAL_TABLET | Freq: Two times a day (BID) | ORAL | 0 refills | Status: DC

## 2020-12-09 NOTE — Progress Notes (Signed)
PRN posotp

## 2020-12-12 ENCOUNTER — Encounter (INDEPENDENT_AMBULATORY_CARE_PROVIDER_SITE_OTHER): Payer: Self-pay | Admitting: Vascular Surgery

## 2020-12-14 DIAGNOSIS — M62830 Muscle spasm of back: Secondary | ICD-10-CM | POA: Diagnosis not present

## 2020-12-14 DIAGNOSIS — M48062 Spinal stenosis, lumbar region with neurogenic claudication: Secondary | ICD-10-CM | POA: Diagnosis not present

## 2020-12-14 DIAGNOSIS — M47816 Spondylosis without myelopathy or radiculopathy, lumbar region: Secondary | ICD-10-CM | POA: Diagnosis not present

## 2020-12-14 DIAGNOSIS — M4726 Other spondylosis with radiculopathy, lumbar region: Secondary | ICD-10-CM | POA: Diagnosis not present

## 2020-12-14 DIAGNOSIS — M5136 Other intervertebral disc degeneration, lumbar region: Secondary | ICD-10-CM | POA: Diagnosis not present

## 2020-12-14 DIAGNOSIS — M5416 Radiculopathy, lumbar region: Secondary | ICD-10-CM | POA: Diagnosis not present

## 2020-12-17 ENCOUNTER — Other Ambulatory Visit: Payer: Self-pay

## 2020-12-17 ENCOUNTER — Ambulatory Visit (INDEPENDENT_AMBULATORY_CARE_PROVIDER_SITE_OTHER): Payer: Medicare HMO

## 2020-12-17 ENCOUNTER — Encounter: Payer: Self-pay | Admitting: Podiatry

## 2020-12-17 ENCOUNTER — Ambulatory Visit (INDEPENDENT_AMBULATORY_CARE_PROVIDER_SITE_OTHER): Payer: Medicare HMO | Admitting: Podiatry

## 2020-12-17 VITALS — BP 132/63 | HR 63 | Temp 97.1°F

## 2020-12-17 DIAGNOSIS — M2041 Other hammer toe(s) (acquired), right foot: Secondary | ICD-10-CM

## 2020-12-17 DIAGNOSIS — Z9889 Other specified postprocedural states: Secondary | ICD-10-CM

## 2020-12-17 NOTE — Progress Notes (Signed)
   Subjective:  Patient presents today status post DIPJ arthroplasty right second digit secondary to gouty tophi; chronic. DOS: 12/09/2020.  Patient states she is doing well.  She has no pain.  She has been taking the antibiotic pills as prescribed.  No new complaints at this time  Past Medical History:  Diagnosis Date  . Anemia   . Arthritis   . Asthma   . B12 deficiency   . Back pain   . Carpal tunnel syndrome   . Chronic kidney disease   . Constipation   . Depression   . Depressive disorder   . Diabetes mellitus   . Dyspnea   . Fluid retention   . Foot pain   . GERD (gastroesophageal reflux disease)   . Headache   . History of hiatal hernia   . Hyperlipidemia   . Hypertension   . IBS (irritable bowel syndrome)   . Insomnia   . Joint pain   . Lumbago   . Memory loss   . Obesity   . 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)   . Pneumonia   . Rhinitis, allergic   . Rosacea   . Sleep apnea   . SOB (shortness of breath)   . Thyroid disease   . Unspecified hearing loss   . Unspecified hereditary and idiopathic peripheral neuropathy   . Unspecified sleep apnea   . Vitamin D deficiency       Objective/Physical Exam Neurovascular status intact.  Skin incisions appear to be well coapted with sutures and staples intact. No sign of infectious process noted. No dehiscence. No active bleeding noted. Moderate edema noted to the surgical surgical time  Radiographic Exam:  Osteotomy of the DIPJ of the right second toe appears to be stable with routine healing  Assessment: 1. s/p DIPJ arthroplasty right second toe. DOS: 12/09/2020   Plan of Care:  1. Patient was evaluated. X-rays reviewed 2.  Dressings changed today.  Patient may begin washing and showering and getting the foot wet 3.  Antibiotic ointment was provided for the patient to apply daily with a Band-Aid 4.  Continue postsurgical shoe 5.  Return to clinic in 2 weeks for suture removal   Edrick Kins, DPM [facility]  Dr. Edrick Kins, DPM    [address], [place] [address]                Office [phone]  Fax [phone]

## 2020-12-26 ENCOUNTER — Other Ambulatory Visit: Payer: Self-pay | Admitting: Family Medicine

## 2020-12-26 DIAGNOSIS — G4733 Obstructive sleep apnea (adult) (pediatric): Secondary | ICD-10-CM

## 2020-12-26 DIAGNOSIS — J4520 Mild intermittent asthma, uncomplicated: Secondary | ICD-10-CM

## 2020-12-26 DIAGNOSIS — F331 Major depressive disorder, recurrent, moderate: Secondary | ICD-10-CM

## 2020-12-26 DIAGNOSIS — N1832 Chronic kidney disease, stage 3b: Secondary | ICD-10-CM

## 2020-12-26 NOTE — Telephone Encounter (Signed)
Requested Prescriptions  Pending Prescriptions Disp Refills  . modafinil (PROVIGIL) 100 MG tablet [Pharmacy Med Name: Modafinil 100mg  Tablet] 90 tablet     Sig: Take 1 tablet by mouth daily.     Off-Protocol Failed - 12/26/2020  6:09 AM      Failed - Medication not assigned to a protocol, review manually.      Passed - Valid encounter within last 12 months    Recent Outpatient Visits          3 months ago Dyslipidemia associated with type 2 diabetes mellitus [facility])   [facility] Steele Sizer, MD   4 months ago Cough   [facility] Lebron Conners D, MD   6 months ago Need for influenza vaccination   [facility] Steele Sizer, MD   9 months ago Dyslipidemia associated with type 2 diabetes mellitus [facility])   [facility] Steele Sizer, MD   12 months ago Adult hypothyroidism   [facility] Steele Sizer, MD      Future Appointments            In 1 week Ralene Bathe, MD [place]   In 1 month Steele Sizer, MD [facility] [place]   In 8 months  [facility]           . montelukast (SINGULAIR) 10 MG tablet [Pharmacy Med Name: Montelukast Sodium 10mg  Tablet] 90 tablet 0    Sig: Take 1 tablet by mouth at bedtime.     Pulmonology:  Leukotriene Inhibitors Passed - 12/26/2020  6:09 AM      Passed - Valid encounter within last 12 months    Recent Outpatient Visits          3 months ago Dyslipidemia associated with type 2 diabetes mellitus [facility])   [facility] Steele Sizer, MD   4 months ago Cough   [facility] Lebron Conners D, MD   6 months ago Need for influenza vaccination   [facility] Steele Sizer, MD   9 months ago Dyslipidemia associated with type 2 diabetes mellitus [facility])   [facility] Steele Sizer, MD   12  months ago Adult hypothyroidism   [facility] Steele Sizer, MD      Future Appointments            In 1 week Ralene Bathe, MD [place]   In 1 month Steele Sizer, MD [facility], [place]   In 8 months  [facility]           . sertraline (ZOLOFT) 100 MG tablet [Pharmacy Med Name: Sertraline Hydrochloride 100mg  Tablet] 135 tablet 0    Sig: Take 1 and 1/2 tablets by mouth daily.     Psychiatry:  Antidepressants - SSRI Passed - 12/26/2020  6:09 AM      Passed - Completed PHQ-2 or PHQ-9 in the last 360 days      Passed - Valid encounter within last 6 months    Recent Outpatient Visits          3 months ago Dyslipidemia associated with type 2 diabetes mellitus [facility])   [facility] Steele Sizer, MD   4 months ago Cough   [place], MD   6 months ago Need for influenza vaccination   [facility]  [facility], Drue Stager, MD   9 months ago Dyslipidemia associated with type 2 diabetes mellitus [facility])   [facility] Steele Sizer, MD   12 months ago Adult hypothyroidism   [facility] Steele Sizer, MD      Future Appointments            In 1 week Ralene Bathe, MD [place]   In 1 month Steele Sizer, MD [facility], [place]   In 8 months  [facility]           . furosemide (LASIX) 20 MG tablet [Pharmacy Med Name: Furosemide 20mg  Tablet] 90 tablet 0    Sig: Take 1 tablet by mouth daily.     Cardiovascular:  Diuretics - Loop Failed - 12/26/2020  6:09 AM      Failed - Cr in normal range and within 360 days    Creat  Date Value Ref Range Status  10/14/2019 1.42 (H) 0.60 - 0.93 mg/dL Final    Comment:    For patients >36 years of age, the reference limit for Creatinine is approximately 13% higher for people identified  as African-American. .    Creatinine, Ser  Date Value Ref Range Status  11/23/2020 1.21 (H) 0.44 - 1.00 mg/dL Final   Creatinine, Urine  Date Value Ref Range Status  01/28/2018 41 20 - 275 mg/dL Final         Passed - K in normal range and within 360 days    Potassium  Date Value Ref Range Status  09/24/2020 4.6 3.5 - 5.2 mmol/L Final         Passed - Ca in normal range and within 360 days    Calcium  Date Value Ref Range Status  09/24/2020 10.0 8.7 - 10.3 mg/dL Final         Passed - Na in normal range and within 360 days    Sodium  Date Value Ref Range Status  09/24/2020 141 134 - 144 mmol/L Final         Passed - Last BP in normal range    BP Readings from Last 1 Encounters:  12/17/20 132/63         Passed - Valid encounter within last 6 months    Recent Outpatient Visits          3 months ago Dyslipidemia associated with type 2 diabetes mellitus [facility])   [facility] Steele Sizer, MD   4 months ago Cough   [place], MD   6 months ago Need for influenza vaccination   [facility] Steele Sizer, MD   9 months ago Dyslipidemia associated with type 2 diabetes mellitus [facility])   [facility] Steele Sizer, MD   12 months ago Adult hypothyroidism   [facility] Steele Sizer, MD      Future Appointments            In 1 week Ralene Bathe, MD [place]   In 1 month Ancil Boozer, Drue Stager, MD [facility], [place]   In 8 months  [facility], [facility]

## 2020-12-26 NOTE — Telephone Encounter (Signed)
Requested medication (s) are due for refill today: yes  Requested medication (s) are on the active medication list: yes  Last refill:  09/14/20  Future visit scheduled: no  Notes to clinic:  medication not assigned to a protocol   Requested Prescriptions  Pending Prescriptions Disp Refills   modafinil (PROVIGIL) 100 MG tablet [Pharmacy Med Name: Modafinil 100mg  Tablet] 90 tablet     Sig: Take 1 tablet by mouth daily.      Off-Protocol Failed - 12/26/2020  6:09 AM      Failed - Medication not assigned to a protocol, review manually.      Passed - Valid encounter within last 12 months    Recent Outpatient Visits           3 months ago Dyslipidemia associated with type 2 diabetes mellitus [facility])   [facility] Steele Sizer, MD   4 months ago Cough   [facility] Lebron Conners D, MD   6 months ago Need for influenza vaccination   [facility] Steele Sizer, MD   9 months ago Dyslipidemia associated with type 2 diabetes mellitus [facility])   [facility] Steele Sizer, MD   12 months ago Adult hypothyroidism   [facility] Steele Sizer, MD       Future Appointments             In 1 week Ralene Bathe, MD [place]   In 1 month Steele Sizer, MD [facility], [place]   In 8 months  [facility], [facility]              Signed Prescriptions Disp Refills   montelukast (SINGULAIR) 10 MG tablet 90 tablet 0    Sig: Take 1 tablet by mouth at bedtime.      Pulmonology:  Leukotriene Inhibitors Passed - 12/26/2020  6:09 AM      Passed - Valid encounter within last 12 months    Recent Outpatient Visits           3 months ago Dyslipidemia associated with type 2 diabetes mellitus [facility])   [facility] Steele Sizer, MD   4 months ago Cough   [facility] Lebron Conners D, MD   6  months ago Need for influenza vaccination   [facility] Steele Sizer, MD   9 months ago Dyslipidemia associated with type 2 diabetes mellitus [facility])   [facility] Steele Sizer, MD   12 months ago Adult hypothyroidism   [facility] Steele Sizer, MD       Future Appointments             In 1 week Ralene Bathe, MD [place]   In 1 month Steele Sizer, MD [facility], [place]   In 8 months  [facility]               sertraline (ZOLOFT) 100 MG tablet 135 tablet 0    Sig: Take 1 and 1/2 tablets by mouth daily.      Psychiatry:  Antidepressants - SSRI Passed - 12/26/2020  6:09 AM      Passed - Completed PHQ-2 or PHQ-9 in the last 360 days      Passed - Valid encounter within last 6 months    Recent Outpatient Visits  3 months ago Dyslipidemia associated with type 2 diabetes mellitus [facility])   [facility] Steele Sizer, MD   4 months ago Cough   [facility] Lebron Conners D, MD   6 months ago Need for influenza vaccination   [facility] Steele Sizer, MD   9 months ago Dyslipidemia associated with type 2 diabetes mellitus [facility])   [facility] Steele Sizer, MD   12 months ago Adult hypothyroidism   [facility] Steele Sizer, MD       Future Appointments             In 1 week Ralene Bathe, MD [place]   In 1 month Steele Sizer, MD [facility], [place]   In 8 months  [facility], [facility]               furosemide (LASIX) 20 MG tablet 90 tablet 0    Sig: Take 1 tablet by mouth daily.      Cardiovascular:  Diuretics - Loop Failed - 12/26/2020  6:09 AM      Failed - Cr in normal range and within 360 days    Creat  Date Value Ref Range Status  10/14/2019 1.42 (H) 0.60 -  0.93 mg/dL Final    Comment:    For patients >39 years of age, the reference limit for Creatinine is approximately 13% higher for people identified as African-American. .    Creatinine, Ser  Date Value Ref Range Status  11/23/2020 1.21 (H) 0.44 - 1.00 mg/dL Final   Creatinine, Urine  Date Value Ref Range Status  01/28/2018 41 20 - 275 mg/dL Final          Passed - K in normal range and within 360 days    Potassium  Date Value Ref Range Status  09/24/2020 4.6 3.5 - 5.2 mmol/L Final          Passed - Ca in normal range and within 360 days    Calcium  Date Value Ref Range Status  09/24/2020 10.0 8.7 - 10.3 mg/dL Final          Passed - Na in normal range and within 360 days    Sodium  Date Value Ref Range Status  09/24/2020 141 134 - 144 mmol/L Final          Passed - Last BP in normal range    BP Readings from Last 1 Encounters:  12/17/20 132/63          Passed - Valid encounter within last 6 months    Recent Outpatient Visits           3 months ago Dyslipidemia associated with type 2 diabetes mellitus [facility])   [facility] Steele Sizer, MD   4 months ago Cough   [facility] Lebron Conners D, MD   6 months ago Need for influenza vaccination   [facility] Steele Sizer, MD   9 months ago Dyslipidemia associated with type 2 diabetes mellitus [facility])   [facility] Steele Sizer, MD   12 months ago Adult hypothyroidism   [facility] Steele Sizer, MD       Future Appointments             In 1 week Ralene Bathe, MD [place]   In 1 month Steele Sizer, MD [place]  [facility], [place]   In 8 months  [place]

## 2020-12-27 NOTE — Telephone Encounter (Signed)
Pt has an appt on 01/28/21

## 2020-12-28 ENCOUNTER — Other Ambulatory Visit: Payer: Self-pay

## 2020-12-28 ENCOUNTER — Ambulatory Visit: Payer: Medicare HMO | Admitting: Podiatry

## 2020-12-28 DIAGNOSIS — E0843 Diabetes mellitus due to underlying condition with diabetic autonomic (poly)neuropathy: Secondary | ICD-10-CM

## 2020-12-28 DIAGNOSIS — Z9889 Other specified postprocedural states: Secondary | ICD-10-CM

## 2020-12-28 DIAGNOSIS — M2041 Other hammer toe(s) (acquired), right foot: Secondary | ICD-10-CM

## 2020-12-29 NOTE — Progress Notes (Signed)
   Subjective:  Patient presents today status post DIPJ arthroplasty right second digit secondary to gouty tophi; chronic. DOS: 12/09/2020.  Patient states she is doing well.  She continues to have no pain.  She took the antibiotic as prescribed.  No new complaints at this time  Past Medical History:  Diagnosis Date  . Anemia   . Arthritis   . Asthma   . B12 deficiency   . Back pain   . Carpal tunnel syndrome   . Chronic kidney disease   . Constipation   . Depression   . Depressive disorder   . Diabetes mellitus   . Dyspnea   . Fluid retention   . Foot pain   . GERD (gastroesophageal reflux disease)   . Headache   . History of hiatal hernia   . Hyperlipidemia   . Hypertension   . IBS (irritable bowel syndrome)   . Insomnia   . Joint pain   . Lumbago   . Memory loss   . Obesity   . 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)   . Pneumonia   . Rhinitis, allergic   . Rosacea   . Sleep apnea   . SOB (shortness of breath)   . Thyroid disease   . Unspecified hearing loss   . Unspecified hereditary and idiopathic peripheral neuropathy   . Unspecified sleep apnea   . Vitamin D deficiency       Objective/Physical Exam Neurovascular status intact.  Skin incisions appear to be well coapted with sutures intact. No sign of infectious process noted. No dehiscence. No active bleeding noted.  There continues to be some edema and erythema localized to the surgical toe.  Overall there is some fibrotic debris along the incision site.  No active drainage or bleeding.  No malodor noted.  Assessment: 1. s/p DIPJ arthroplasty right second toe. DOS: 12/09/2020   Plan of Care:  1. Patient was evaluated.  2.  Sutures removed today.  Light debridement of any dead tissue was performed around the incision site using a tissue nipper.  Overall the skin incision appears somewhat coapted 3.  Recommend Betadine ointment and a light Band-Aid daily 4.  Continue weightbearing in the postsurgical shoe 5.  Return  to clinic in 2 weeks   Edrick Kins, DPM [facility]  Dr. Edrick Kins, DPM    [address], [place] [address]                Office [phone]  Fax [phone]

## 2021-01-03 ENCOUNTER — Ambulatory Visit: Payer: Medicare HMO | Admitting: Dermatology

## 2021-01-03 ENCOUNTER — Other Ambulatory Visit: Payer: Self-pay

## 2021-01-03 DIAGNOSIS — L820 Inflamed seborrheic keratosis: Secondary | ICD-10-CM

## 2021-01-03 DIAGNOSIS — L918 Other hypertrophic disorders of the skin: Secondary | ICD-10-CM | POA: Diagnosis not present

## 2021-01-03 DIAGNOSIS — L821 Other seborrheic keratosis: Secondary | ICD-10-CM | POA: Diagnosis not present

## 2021-01-03 DIAGNOSIS — L578 Other skin changes due to chronic exposure to nonionizing radiation: Secondary | ICD-10-CM | POA: Diagnosis not present

## 2021-01-03 DIAGNOSIS — D492 Neoplasm of unspecified behavior of bone, soft tissue, and skin: Secondary | ICD-10-CM

## 2021-01-03 NOTE — Patient Instructions (Signed)
Cryotherapy Aftercare  . Wash gently with soap and water everyday.   . Apply Vaseline and Band-Aid daily until healed.  

## 2021-01-03 NOTE — Progress Notes (Signed)
   Follow-Up Visit   Subjective  Summer [name] is a 70 y.o. female who presents for the following: Skin Problem (Remove irritated tags). Pt c/o brown spots on her back irritated by her bra she would like to have removed today.   The following portions of the chart were reviewed this encounter and updated as appropriate:   Tobacco  Allergies  Meds  Problems  Med Hx  Surg Hx  Fam Hx     Review of Systems:  No other skin or systemic complaints except as noted in HPI or Assessment and Plan.  Objective  Well appearing patient in no apparent distress; mood and affect are within normal limits.  A focused examination was performed including face, back, axillae. Relevant physical exam findings are noted in the Assessment and Plan.  Objective  Mid Back x 19 (19): Erythematous keratotic or waxy stuck-on papule or plaque.   Objective  Right scapula: Pink irregular patch   Objective  Right Axilla, left axilla: Fleshy, skin-colored pedunculated papules.     Assessment & Plan  Inflamed seborrheic keratosis (19) Mid Back x 19  Destruction of lesion - Mid Back x 19 Complexity: simple   Destruction method: cryotherapy   Informed consent: discussed and consent obtained   Timeout:  patient name, date of birth, surgical site, and procedure verified Lesion destroyed using liquid nitrogen: Yes   Region frozen until ice ball extended beyond lesion: Yes   Outcome: patient tolerated procedure well with no complications   Post-procedure details: wound care instructions given    Neoplasm of skin Right scapula We will plan shave removal in a few weeks at the next office visit   Skin tag -symptomatic and irritating Right Axilla, left axilla We will plan on snip removal at the next office visit  Actinic Damage - chronic, secondary to cumulative UV radiation exposure/sun exposure over time - diffuse scaly erythematous macules with underlying dyspigmentation - Recommend daily broad  spectrum sunscreen SPF 30+ to sun-exposed areas, reapply every 2 hours as needed.  - Recommend staying in the shade or wearing long sleeves, sun glasses (UVA+UVB protection) and wide brim hats (4-inch brim around the entire circumference of the hat). - Call for new or changing lesions.  Seborrheic Keratoses - Stuck-on, waxy, tan-brown papules and/or plaques  - Benign-appearing - Discussed benign etiology and prognosis. - Observe - Call for any changes  Return in about 3 weeks (around 01/24/2021) for skin tag removal  and biopsy .  IMarye Round, CMA, am acting as scribe for Sarina Ser, MD .  Documentation: I have reviewed the above documentation for accuracy and completeness, and I agree with the above.  Sarina Ser, MD

## 2021-01-04 ENCOUNTER — Encounter: Payer: Self-pay | Admitting: Dermatology

## 2021-01-11 ENCOUNTER — Ambulatory Visit (INDEPENDENT_AMBULATORY_CARE_PROVIDER_SITE_OTHER): Payer: Medicare HMO

## 2021-01-11 ENCOUNTER — Telehealth: Payer: Self-pay | Admitting: *Deleted

## 2021-01-11 ENCOUNTER — Other Ambulatory Visit: Payer: Self-pay | Admitting: Podiatry

## 2021-01-11 ENCOUNTER — Ambulatory Visit (INDEPENDENT_AMBULATORY_CARE_PROVIDER_SITE_OTHER): Payer: Medicare HMO | Admitting: Podiatry

## 2021-01-11 ENCOUNTER — Other Ambulatory Visit: Payer: Self-pay

## 2021-01-11 DIAGNOSIS — M2041 Other hammer toe(s) (acquired), right foot: Secondary | ICD-10-CM

## 2021-01-11 DIAGNOSIS — Z9889 Other specified postprocedural states: Secondary | ICD-10-CM | POA: Diagnosis not present

## 2021-01-11 DIAGNOSIS — E0843 Diabetes mellitus due to underlying condition with diabetic autonomic (poly)neuropathy: Secondary | ICD-10-CM

## 2021-01-11 DIAGNOSIS — R252 Cramp and spasm: Secondary | ICD-10-CM

## 2021-01-11 DIAGNOSIS — L97522 Non-pressure chronic ulcer of other part of left foot with fat layer exposed: Secondary | ICD-10-CM

## 2021-01-11 MED ORDER — CYCLOBENZAPRINE HCL 5 MG PO TABS: 5.0000 mg | ORAL_TABLET | Freq: Three times a day (TID) | ORAL | 1 refills | Status: DC | PRN

## 2021-01-11 MED ORDER — DOXYCYCLINE HYCLATE 100 MG PO TABS: 100.0000 mg | ORAL_TABLET | Freq: Two times a day (BID) | ORAL | 0 refills | Status: DC

## 2021-01-11 NOTE — Progress Notes (Signed)
   Subjective:  Patient presents today status post DIPJ arthroplasty right second digit secondary to gouty tophi; chronic. DOS: 12/09/2020.  Patient states that she is doing well.  She is concerned because she continues to have some erythema and edema to the surgical toe.  There is some slight drainage.  She has been applying the Betadine ointment and a light dressing daily.  She is also weightbearing in the postsurgical shoe as instructed.  No new complaints at this time  Past Medical History:  Diagnosis Date  . Anemia   . Arthritis   . Asthma   . B12 deficiency   . Back pain   . Carpal tunnel syndrome   . Chronic kidney disease   . Constipation   . Depression   . Depressive disorder   . Diabetes mellitus   . Dyspnea   . Fluid retention   . Foot pain   . GERD (gastroesophageal reflux disease)   . Headache   . History of hiatal hernia   . Hyperlipidemia   . Hypertension   . IBS (irritable bowel syndrome)   . Insomnia   . Joint pain   . Lumbago   . Memory loss   . Obesity   . 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)   . Pneumonia   . Rhinitis, allergic   . Rosacea   . Sleep apnea   . SOB (shortness of breath)   . Thyroid disease   . Unspecified hearing loss   . Unspecified hereditary and idiopathic peripheral neuropathy   . Unspecified sleep apnea   . Vitamin D deficiency         Objective/Physical Exam Neurovascular status intact. There is some fibrotic debris to the incision site.  Mild dehiscence.  There is not appear to be any exposed bone.  No active bleeding noted.  There continues to be some edema and erythema localized to the surgical toe.  Overall there is some fibrotic debris along the incision site.  Minimal serous drainage noted.  No malodor noted.  Radiographic exam Arthroplasty noted to the right second toe.  Soft tissue edema noted.  No gas within the tissues.  Assessment: 1. s/p DIPJ arthroplasty right second toe. DOS: 12/09/2020   Plan of Care:  1. Patient  was evaluated.  2.  Debridement was performed of fibrotic tissue and the toenail to the surgical toe was avulsed.  The patient has no sensation.  No local anesthesia was utilized. 3.  Cultures were taken and sent to pathology for culture and sensitivity 4.  Prescription for doxycycline 100 mg 2 times daily #20 5.  The patient does get intermittent cramping to the foot.  Prescription for Flexeril 5 mg 3 times daily 6.  Recommend Betadine soaks daily 7.  Return to clinic in 2 weeks   Edrick Kins, DPM [facility]  Dr. Edrick Kins, DPM    [address],  [address]                Office [phone]  Fax [phone]

## 2021-01-11 NOTE — Telephone Encounter (Signed)
"  I was there earlier today.  Dr. Amalia Hailey told me he was going to call me in an antibiotic and a muscle relaxer.  I got the antibiotic but they said they didn't have an order for the other."  I'll let Dr. Amalia Hailey know.

## 2021-01-16 LAB — WOUND CULTURE

## 2021-01-18 ENCOUNTER — Ambulatory Visit: Payer: Medicare HMO | Admitting: Dermatology

## 2021-01-20 ENCOUNTER — Other Ambulatory Visit: Payer: Self-pay | Admitting: Family Medicine

## 2021-01-20 DIAGNOSIS — K219 Gastro-esophageal reflux disease without esophagitis: Secondary | ICD-10-CM

## 2021-01-20 NOTE — Telephone Encounter (Signed)
Requested Prescriptions  Pending Prescriptions Disp Refills  . omeprazole (PRILOSEC) 40 MG capsule [Pharmacy Med Name: Omeprazole 40mg  Delayed-Release Capsule] 90 capsule 0    Sig: Take 1 capsule by mouth daily.     Gastroenterology: Proton Pump Inhibitors Passed - 01/20/2021  5:43 AM      Passed - Valid encounter within last 12 months    Recent Outpatient Visits          3 months ago Dyslipidemia associated with type 2 diabetes mellitus [facility])   [facility] Steele Sizer, MD   5 months ago Cough   [facility] Lebron Conners D, MD   7 months ago Need for influenza vaccination   [facility] Steele Sizer, MD   10 months ago Dyslipidemia associated with type 2 diabetes mellitus [facility])   [facility] Steele Sizer, MD   1 year ago Adult hypothyroidism   [facility] Steele Sizer, MD      Future Appointments            In 1 week Steele Sizer, MD [facility], [place]   In 7 months  [facility], [facility]

## 2021-01-25 ENCOUNTER — Other Ambulatory Visit: Payer: Self-pay

## 2021-01-25 ENCOUNTER — Encounter: Payer: Self-pay | Admitting: Podiatry

## 2021-01-25 ENCOUNTER — Ambulatory Visit (INDEPENDENT_AMBULATORY_CARE_PROVIDER_SITE_OTHER): Payer: Medicare HMO | Admitting: Podiatry

## 2021-01-25 ENCOUNTER — Ambulatory Visit (INDEPENDENT_AMBULATORY_CARE_PROVIDER_SITE_OTHER): Payer: Medicare HMO

## 2021-01-25 DIAGNOSIS — E0843 Diabetes mellitus due to underlying condition with diabetic autonomic (poly)neuropathy: Secondary | ICD-10-CM

## 2021-01-25 DIAGNOSIS — Z9889 Other specified postprocedural states: Secondary | ICD-10-CM | POA: Diagnosis not present

## 2021-01-25 DIAGNOSIS — L97512 Non-pressure chronic ulcer of other part of right foot with fat layer exposed: Secondary | ICD-10-CM | POA: Diagnosis not present

## 2021-01-25 DIAGNOSIS — M2041 Other hammer toe(s) (acquired), right foot: Secondary | ICD-10-CM

## 2021-01-25 NOTE — Progress Notes (Signed)
   Subjective:  Patient presents today status post DIPJ arthroplasty right second digit secondary to gouty tophi; chronic. DOS: 12/09/2020.  Patient states that she is doing well.  Overall she says that the erythema has improved.  She has been applying the Betadine ointment and a light dressing daily.  She has been weightbearing in the postsurgical shoe as instructed.  No new complaints at this time  Past Medical History:  Diagnosis Date  . Anemia   . Arthritis   . Asthma   . B12 deficiency   . Back pain   . Carpal tunnel syndrome   . Chronic kidney disease   . Constipation   . Depression   . Depressive disorder   . Diabetes mellitus   . Dyspnea   . Fluid retention   . Foot pain   . GERD (gastroesophageal reflux disease)   . Headache   . History of hiatal hernia   . Hyperlipidemia   . Hypertension   . IBS (irritable bowel syndrome)   . Insomnia   . Joint pain   . Lumbago   . Memory loss   . Obesity   . 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)   . Pneumonia   . Rhinitis, allergic   . Rosacea   . Sleep apnea   . SOB (shortness of breath)   . Thyroid disease   . Unspecified hearing loss   . Unspecified hereditary and idiopathic peripheral neuropathy   . Unspecified sleep apnea   . Vitamin D deficiency     Objective/Physical Exam Ulcer noted overlying the DIPJ of the right second toe measuring approximately 0.2 x 0.7 x 0.3 cm.  To the noted ulceration there is no eschar.  There is a moderate amount of slough fibrin and necrotic debris noted within the wound base.  Granulation tissue is red.  Serous drainage noted.  There is some mild erythema around the toe site.  Radiographic exam Arthroplasty noted to the right second toe.  Soft tissue edema noted.  No gas within the tissues.  Osteotomy site appears clean margins.  There is no erosive changes of the bone  Assessment: 1. s/p DIPJ arthroplasty right second toe. DOS: 12/09/2020   Plan of Care:  1. Patient was evaluated.  2.   Medically necessary excisional debridement including subcutaneous tissue was performed using a tissue nipper with postdebridement measurement same as pre-.  Excisional debridement of all the necrotic nonviable tissue down to healthier bleeding viable tissue was performed. 3.  Collagen micro matrix dressing was provided for the patient to apply daily with a Band-Aid 4.  Return to clinic in 3 weeks  Edrick Kins, DPM [facility]  Dr. Edrick Kins, DPM    [address], [place] [address]                Office [phone]  Fax [phone]

## 2021-01-28 ENCOUNTER — Ambulatory Visit (INDEPENDENT_AMBULATORY_CARE_PROVIDER_SITE_OTHER): Payer: Medicare HMO | Admitting: Family Medicine

## 2021-01-28 ENCOUNTER — Other Ambulatory Visit: Payer: Self-pay

## 2021-01-28 ENCOUNTER — Encounter: Payer: Self-pay | Admitting: Family Medicine

## 2021-01-28 VITALS — BP 122/74 | HR 90 | Temp 97.9°F | Resp 16 | Ht 62.0 in | Wt 242.0 lb

## 2021-01-28 DIAGNOSIS — I7025 Atherosclerosis of native arteries of other extremities with ulceration: Secondary | ICD-10-CM | POA: Diagnosis not present

## 2021-01-28 DIAGNOSIS — G9519 Other vascular myelopathies: Secondary | ICD-10-CM

## 2021-01-28 DIAGNOSIS — F331 Major depressive disorder, recurrent, moderate: Secondary | ICD-10-CM

## 2021-01-28 DIAGNOSIS — E1169 Type 2 diabetes mellitus with other specified complication: Secondary | ICD-10-CM

## 2021-01-28 DIAGNOSIS — F5105 Insomnia due to other mental disorder: Secondary | ICD-10-CM | POA: Diagnosis not present

## 2021-01-28 DIAGNOSIS — I10 Essential (primary) hypertension: Secondary | ICD-10-CM

## 2021-01-28 DIAGNOSIS — E785 Hyperlipidemia, unspecified: Secondary | ICD-10-CM | POA: Diagnosis not present

## 2021-01-28 DIAGNOSIS — F99 Mental disorder, not otherwise specified: Secondary | ICD-10-CM

## 2021-01-28 DIAGNOSIS — E039 Hypothyroidism, unspecified: Secondary | ICD-10-CM | POA: Diagnosis not present

## 2021-01-28 DIAGNOSIS — G4733 Obstructive sleep apnea (adult) (pediatric): Secondary | ICD-10-CM | POA: Diagnosis not present

## 2021-01-28 DIAGNOSIS — J449 Chronic obstructive pulmonary disease, unspecified: Secondary | ICD-10-CM | POA: Insufficient documentation

## 2021-01-28 MED ORDER — LEVOTHYROXINE SODIUM 25 MCG PO TABS: ORAL_TABLET | ORAL | 0 refills | Status: DC

## 2021-01-28 MED ORDER — ICOSAPENT ETHYL 1 G PO CAPS: 2.0000 g | ORAL_CAPSULE | Freq: Two times a day (BID) | ORAL | 1 refills | Status: DC

## 2021-01-28 MED ORDER — ROSUVASTATIN CALCIUM 20 MG PO TABS: 20.0000 mg | ORAL_TABLET | Freq: Every day | ORAL | 1 refills | Status: DC

## 2021-01-28 MED ORDER — TRAZODONE HCL 50 MG PO TABS: 50.0000 mg | ORAL_TABLET | Freq: Every evening | ORAL | 1 refills | Status: DC | PRN

## 2021-01-28 MED ORDER — SERTRALINE HCL 100 MG PO TABS: 1.5000 | ORAL_TABLET | Freq: Every day | ORAL | 0 refills | Status: DC

## 2021-01-28 MED ORDER — IRBESARTAN-HYDROCHLOROTHIAZIDE 300-12.5 MG PO TABS: 1.0000 | ORAL_TABLET | Freq: Every day | ORAL | 1 refills | Status: DC

## 2021-01-28 MED ORDER — MODAFINIL 100 MG PO TABS: 100.0000 mg | ORAL_TABLET | Freq: Every day | ORAL | 3 refills | Status: DC

## 2021-01-28 MED ORDER — METOPROLOL TARTRATE 25 MG PO TABS: 25.0000 mg | ORAL_TABLET | Freq: Two times a day (BID) | ORAL | 1 refills | Status: DC

## 2021-01-28 NOTE — Progress Notes (Signed)
Name: Summer [name]   MRN: [id number]    DOB: [birth year]   Date:01/28/2021       Progress Note  Subjective  Chief Complaint  Follow Up  HPI  Hypothyroidism: she states hair loss is stable, no dysphagia, no change in bowel movements. On levothyroxine 25 mg daily and one and two on Sundays , last level was at goal, we will recheck next visit   Morbid Obesity: she is doing better , weight is up two pounds since last visit, she states unable to be physically active  MDD: she is currently taking Zoloft 150 mg and Wellbutrin, she is still feeling down, she refuses to go back to psychiatrist. She states her significant other retired and they are struggling financially, she is also having more pain and going to more doctors appointments lately, make her get frustrated, She states modafinil helps with energy and needs a refill. Denies suicidal thoughts or ideation  Right lumbar radiculitis: symptoms started a few days ago, she woke up with pain shooting from right lower back to right lower leg down to the area of paresthesia from diabetic neuropathy. She was given flexeril from Dr. Amalia Hailey but not helping with symptoms. She is already taking gabapentin for neuropathy, explained that nsaid's are likely risk due to her CKI but we can try it for a few days, or prednisone but it will increase her glucose . She has a history of DDD lumbar spine, seen by Dr. Lacinda Axon in the past and had procedures done, advised her to contact pain clinic for follow up   Atherosclerosis of aorta with foot ulceration : found on CT abdomen, she also had peripheral vascular craterization 04/25 and she has infrarenal obstruction. She is only on statin therapy, she is on Vascepa . She cannot afford Repatha . She is seeing Dr. Amalia Hailey for debridements of her ulceration   Diabetes : she is under the care of Endocrinologist, Dr. Honor Junes , last A1C was 7.1 % in January 2022 . She has dyslipidemia, obesity, CKI, and HTN associated with DM.She  is compliant with medication , eye exam is up to date, she had 5th toe amputated due to neuropathy and had a recent DIP J arthroplasty of right second toe back in 11/2020 but is still not healed.   History of left 5th toe amputation for treatment of diabetic foot ulcer and osteomyelitis . Last visit with Dr. Amalia Hailey was 09/21/2020, had a arthroplasty of right 2nd toe March 2022 and itis not healing properly   CKI stage III: GFR is stable, she is on ARB, bp is controlled, seeing nephrologist - Dr. Holley Raring. She has good urine output and no pruritis  Last GFR was down to 34 back in Feb 2022 We will recheck labs today   Dyslipidemia: had labs done 05/13/2020 HDL is slightly better, LDL still above goal at 117 , triglycerides was high , she stopped taking fish oil due to cost, cannot afford Repatha either   OSA: she continues to wake up feeling tired, we started Mondafinil this year and it has helped with energy , she is still not wearing CPAP machine, unable to tolerate it . Trazodone is no longer helping her stay asleep, advised to discuss it with her psychiatrist.   HTN: bp at home has been around 130's/80's, no chest pain or palpitation, she has SOB with activity but stable. Seeing Dr. Saunders Revel for SOB, likely multifactorial. Continue medications    Patient Active Problem List   Diagnosis Date Noted  .  Atherosclerosis of native arteries of the extremities with ulceration ([place]) 12/03/2020  . Other fatigue 05/24/2020  . Diabetes mellitus ([place]) 05/24/2020  . B12 deficiency 05/24/2020  . Vitamin D deficiency 05/24/2020  . Metatarsalgia of left foot 01/15/2020  . Preop cardiovascular exam 07/18/2019  . Major depressive disorder, recurrent episode, moderate ([place]) 04/04/2019  . Left arm weakness 04/03/2019  . (HFpEF) heart failure with preserved ejection fraction ([place]) 08/31/2017  . Moderate persistent asthma 08/31/2017  . Hyperlipidemia associated with type 2 diabetes mellitus ([place]) 06/14/2017  .  Dyslipidemia associated with type 2 diabetes mellitus ([place]) 08/18/2016  . Charcot foot due to diabetes mellitus ([place]) 02/22/2016  . Acquired abduction deformity of foot 07/12/2015  . Osteoarthritis of subtalar joint 07/12/2015  . Poorly controlled type 2 diabetes mellitus with neuropathy ([place]) 07/12/2015  . Arthritis of foot, degenerative 07/12/2015  . Carpal tunnel syndrome 04/17/2015  . Chronic constipation 04/17/2015  . Insomnia, persistent 04/17/2015  . Stage 3 chronic kidney disease ([place]) 04/17/2015  . Decreased exercise tolerance 04/17/2015  . Diabetes mellitus with polyneuropathy ([place]) 04/17/2015  . Gastro-esophageal reflux disease without esophagitis 04/17/2015  . Bursitis, trochanteric 04/17/2015  . Cephalalgia 04/17/2015  . Hypertension associated with diabetes ([place]) 04/17/2015  . Adult hypothyroidism 04/17/2015  . Hearing loss 04/17/2015  . Chronic recurrent major depressive disorder ([place]) 04/17/2015  . Neurogenic claudication ([place]) 04/17/2015  . Class 3 severe obesity with serious comorbidity and body mass index (BMI) of 45.0 to 49.9 in adult ([place]) 04/17/2015  . Hypo-ovarianism 04/17/2015  . Perennial allergic rhinitis with seasonal variation 04/17/2015  . Acne erythematosa 04/17/2015  . Dyskinesia, tardive 04/17/2015  . Memory loss 04/17/2015  . Impingement syndrome of shoulder 04/17/2015  . Dermatitis, stasis 04/17/2015  . Obstructive sleep apnea 05/14/2014  . Shortness of breath on exertion 05/06/2014  . Mixed hyperlipidemia 02/06/2012  . LBP (low back pain) 09/16/2008    Past Surgical History:  Procedure Laterality Date  . ABDOMINAL HYSTERECTOMY  1975  . ANKLE SURGERY Left approx Jan 2018  . APPENDECTOMY  1970  . CATARACT EXTRACTION  01/2011   right  . COLONOSCOPY WITH PROPOFOL N/A 08/19/2019   Procedure: COLONOSCOPY WITH PROPOFOL;  Surgeon: Jonathon Bellows, MD;  Location: [facility] ENDOSCOPY;  Service: Gastroenterology;  Laterality: N/A;  . eye lid surgery  2013    bilateral  . FOOT SURGERY    . LOWER EXTREMITY ANGIOGRAPHY Left 11/23/2020   Procedure: LOWER EXTREMITY ANGIOGRAPHY;  Surgeon: Katha Cabal, MD;  Location: [facility];  Service: Cardiovascular;  Laterality: Left;  . NECK SURGERY    . SPINE SURGERY    . TUBAL LIGATION    . VAGINAL HYSTERECTOMY  1989    Family History  Problem Relation Age of Onset  . Aneurysm Mother   . Aortic aneurysm Mother   . Hypertension Mother   . Hyperlipidemia Mother   . Heart disease Mother   . Obesity Mother   . Heart attack Maternal Grandfather   . Diabetes Maternal Grandfather     Social History   Tobacco Use  . Smoking status: Never Smoker  . Smokeless tobacco: Never Used  Substance Use Topics  . Alcohol use: No    Alcohol/week: 0.0 standard drinks     Current Outpatient Medications:  .  acetaminophen (TYLENOL 8 HOUR) 650 MG CR tablet, Take 1 tablet (650 mg total) by mouth every 8 (eight) hours as needed for pain., Disp: 90 tablet, Rfl: 2 .  albuterol (PROVENTIL) (2.5 MG/3ML) 0.083%  nebulizer solution, Take 3 mLs (2.5 mg total) by nebulization every 6 (six) hours as needed., Disp: 75 mL, Rfl: 2 .  Alcohol Swabs (B-D SINGLE USE SWABS REGULAR) PADS, , Disp: , Rfl:  .  ASPIRIN LOW DOSE 81 MG EC tablet, TAKE 1 TABLET (81 MG TOTAL) BY MOUTH DAILY., Disp: 30 tablet, Rfl: 0 .  Blood Glucose Monitoring Suppl (ACCU-CHEK AVIVA PLUS) w/Device KIT, , Disp: , Rfl:  .  buPROPion (WELLBUTRIN XL) 150 MG 24 hr tablet, Take 1 tablet (150 mg total) by mouth daily., Disp: 90 tablet, Rfl: 0 .  Calcium Carb-Cholecalciferol (CALCIUM 600+D3 PO), Take by mouth., Disp: , Rfl:  .  Cyanocobalamin (VITAMIN B12 PO), Take 1 tablet by mouth daily. , Disp: , Rfl:  .  furosemide (LASIX) 20 MG tablet, Take 1 tablet by mouth daily., Disp: 90 tablet, Rfl: 0 .  insulin NPH-regular Human (70-30) 100 UNIT/ML injection, Inject 100 Units into the skin daily with supper., Disp: , Rfl:  .  irbesartan-hydrochlorothiazide  (AVALIDE) 300-12.5 MG tablet, Take 1 tablet by mouth daily., Disp: 90 tablet, Rfl: 1 .  levothyroxine (SYNTHROID) 25 MCG tablet, Take 1 tablet by mouth daily except, take 1 and 1/2 tablets by mouth on Sunday., Disp: 100 tablet, Rfl: 0 .  metFORMIN (GLUCOPHAGE) 500 MG tablet, Take 500 mg by mouth daily., Disp: , Rfl:  .  metoprolol tartrate (LOPRESSOR) 25 MG tablet, Take 1 tablet (25 mg total) by mouth 2 (two) times daily., Disp: 180 tablet, Rfl: 1 .  Misc Natural Products (OSTEO BI-FLEX ADV JOINT SHIELD PO), Take by mouth 2 (two) times daily. , Disp: , Rfl:  .  modafinil (PROVIGIL) 100 MG tablet, Take 1 tablet by mouth daily., Disp: 30 tablet, Rfl: 0 .  montelukast (SINGULAIR) 10 MG tablet, Take 1 tablet by mouth at bedtime., Disp: 90 tablet, Rfl: 0 .  omeprazole (PRILOSEC) 40 MG capsule, Take 1 capsule by mouth daily., Disp: 90 capsule, Rfl: 0 .  Potassium 99 MG TABS, Take by mouth daily at 6 (six) AM., Disp: , Rfl:  .  pregabalin (LYRICA) 300 MG capsule, Take 300 mg by mouth 2 (two) times a day., Disp: , Rfl:  .  Probiotic Product (PROBIOTIC-10 PO), Take by mouth., Disp: , Rfl:  .  RELION INSULIN SYRINGE 1ML/31G 31G X 5/16" 1 ML MISC, , Disp: , Rfl:  .  rosuvastatin (CRESTOR) 20 MG tablet, Take 1 tablet (20 mg total) by mouth daily., Disp: 90 tablet, Rfl: 1 .  sertraline (ZOLOFT) 100 MG tablet, Take 1 and 1/2 tablets by mouth daily., Disp: 135 tablet, Rfl: 0 .  traZODone (DESYREL) 50 MG tablet, Take 1 tablet (50 mg total) by mouth at bedtime as needed for sleep., Disp: 90 tablet, Rfl: 1 .  vitamin C (ASCORBIC ACID) 500 MG tablet, Take 500 mg by mouth 2 (two) times daily. , Disp: , Rfl:  .  VITAMIN D, CHOLECALCIFEROL, PO, Take 1,000 Units by mouth daily. , Disp: , Rfl:  .  ACCU-CHEK SOFTCLIX LANCETS lancets, , Disp: , Rfl:  .  albuterol (VENTOLIN HFA) 108 (90 Base) MCG/ACT inhaler, every 6 (six) hours as needed. (Patient not taking: No sig reported), Disp: , Rfl:  .  ciprofloxacin (CIPRO) 500 MG  tablet, Take 1 tablet (500 mg total) by mouth 2 (two) times daily. (Patient not taking: Reported on 01/28/2021), Disp: 20 tablet, Rfl: 0 .  cyclobenzaprine (FLEXERIL) 5 MG tablet, Take 1 tablet (5 mg total) by mouth 3 (three) times daily  as needed for muscle spasms. (Patient not taking: Reported on 01/28/2021), Disp: 30 tablet, Rfl: 1 .  DICLOFENAC PO, Take by mouth in the morning and at bedtime. (Patient not taking: Reported on 01/28/2021), Disp: , Rfl:  .  doxycycline (VIBRA-TABS) 100 MG tablet, Take 1 tablet (100 mg total) by mouth 2 (two) times daily. (Patient not taking: Reported on 01/28/2021), Disp: 20 tablet, Rfl: 0 .  DULoxetine (CYMBALTA) 60 MG capsule, Take 60 mg by mouth daily. (Patient not taking: Reported on 01/28/2021), Disp: , Rfl:  .  metroNIDAZOLE (METROCREAM) 0.75 % cream, Apply topically in the morning and at bedtime. (Patient not taking: Reported on 01/28/2021), Disp: 45 g, Rfl: 0 .  Miconazole Nitrate 2 % POWD, Apply to skin folds twice a day (Patient not taking: Reported on 01/28/2021), Disp: 100 g, Rfl: 2 .  Omega-3 Fatty Acids (FISH OIL) 1000 MG CAPS, Take 2 capsules by mouth daily. (Patient not taking: Reported on 01/28/2021), Disp: , Rfl:  .  tiZANidine (ZANAFLEX) 4 MG tablet, Take 8 mg by mouth daily with supper. (Patient not taking: Reported on 01/28/2021), Disp: , Rfl:   Allergies  Allergen Reactions  . Codeine Other (See Comments)    "TRIPPED OUT"  DIDN'T LIKE THE [place]  . Atorvastatin     muscle pain  . Hydrocodone     itching  . Tramadol   . Latex Rash  . Zolpidem Other (See Comments)    Sleep walk    I personally reviewed active problem list, medication list, allergies, family history, social history, health maintenance with the patient/caregiver today.   ROS  Constitutional: Negative for fever or weight change.  Respiratory: Negative for cough and shortness of breath.   Cardiovascular: Negative for chest pain or palpitations.  Gastrointestinal: Negative  for abdominal pain, no bowel changes.  Musculoskeletal: Negative for gait problem or joint swelling.  Skin: Negative for rash.  Neurological: Negative for dizziness or headache.  No other specific complaints in a complete review of systems (except as listed in HPI above).  Objective  Vitals:   01/28/21 1420  BP: 122/74  Pulse: 90  Resp: 16  Temp: 97.9 F (36.6 C)  TempSrc: Oral  SpO2: 97%  Weight: 242 lb (109.8 kg)  Height: $Remove'5\' 2"'swXrqiN$  (1.575 m)    Body mass index is 44.26 kg/m.  Physical Exam  Constitutional: Patient appears well-developed and well-nourished. Obese No distress.  HEENT: head atraumatic, normocephalic, pupils equal and reactive to light, , neck supple Cardiovascular: Normal rate, regular rhythm and normal heart sounds.  No murmur heard. No BLE edema. Pulmonary/Chest: Effort normal and breath sounds normal. No respiratory distress. Abdominal: Soft.  There is no tenderness. Psychiatric: Patient has a normal mood and affect. behavior is normal. Judgment and thought content normal. Muscular skeletal : pain on right lower back, negative straight leg raise , slow gait, uses walker   Recent Results (from the past 2160 hour(s))  SARS CORONAVIRUS 2 (TAT 6-24 HRS) Nasopharyngeal Nasopharyngeal Swab     Status: None   Collection Time: 11/19/20 10:40 AM   Specimen: Nasopharyngeal Swab  Result Value Ref Range   SARS Coronavirus 2 NEGATIVE NEGATIVE    Comment: (NOTE) SARS-CoV-2 target nucleic acids are NOT DETECTED.  The SARS-CoV-2 RNA is generally detectable in upper and lower respiratory specimens during the acute phase of infection. Negative results do not preclude SARS-CoV-2 infection, do not rule out co-infections with other pathogens, and should not be used as the sole basis for  treatment or other patient management decisions. Negative results must be combined with clinical observations, patient history, and epidemiological information. The expected result is  Negative.  Fact Sheet for Patients: SugarRoll.be  Fact Sheet for Healthcare Providers: https://www.woods-mathews.com/  This test is not yet approved or cleared by the [place] FDA and  has been authorized for detection and/or diagnosis of SARS-CoV-2 by FDA under an Emergency Use Authorization (EUA). This EUA will remain  in effect (meaning this test can be used) for the duration of the COVID-19 declaration under Se ction 564(b)(1) of the Act, 21 U.S.C. section 360bbb-3(b)(1), unless the authorization is terminated or revoked sooner.  Performed at [facility], [place] [address], [place], [place] [address]   BUN     Status: Abnormal   Collection Time: 11/23/20  9:37 AM  Result Value Ref Range   BUN 45 (H) 8 - 23 mg/dL    Comment: Performed at [facility], [address] [place], [place] [address]  Creatinine, serum     Status: Abnormal   Collection Time: 11/23/20  9:37 AM  Result Value Ref Range   Creatinine, Ser 1.21 (H) 0.44 - 1.00 mg/dL   GFR, Estimated 48 (L) >60 mL/min    Comment: (NOTE) Calculated using the CKD-EPI Creatinine Equation (2021) Performed at [facility], [address] [place], [place] [address]   Glucose, capillary     Status: Abnormal   Collection Time: 11/23/20  9:43 AM  Result Value Ref Range   Glucose-Capillary 162 (H) 70 - 99 mg/dL    Comment: Glucose reference range applies only to samples taken after fasting for at least 8 hours.  Glucose, capillary     Status: Abnormal   Collection Time: 11/23/20 11:22 AM  Result Value Ref Range   Glucose-Capillary 150 (H) 70 - 99 mg/dL    Comment: Glucose reference range applies only to samples taken after fasting for at least 8 hours.  WOUND CULTURE     Status: Abnormal   Collection Time: 01/11/21 10:50 AM  Result Value Ref Range   Gram Stain Result Final report    Organism ID, Bacteria Comment     Comment: No white blood cells seen.    Organism ID, Bacteria Comment     Comment: Moderate number of gram positive cocci.   Aerobic Bacterial Culture Final report (A)    Organism ID, Bacteria Staphylococcus aureus (A)     Comment: Based on susceptibility to oxacillin this isolate would be susceptible to: *Penicillinase-stable penicillins, such as:   Cloxacillin, Dicloxacillin, Nafcillin *Beta-lactam combination agents, such as:   Amoxicillin-clavulanic acid, Ampicillin-sulbactam,   Piperacillin-tazobactam *Oral cephems, such as:   Cefaclor, Cefdinir, Cefpodoxime, Cefprozil, Cefuroxime,   Cephalexin, Loracarbef *Parenteral cephems, such as:   Cefazolin, Cefepime, Cefotaxime, Cefotetan, Ceftaroline,   Ceftizoxime, Ceftriaxone, Cefuroxime *Carbapenems, such as:   Doripenem, Ertapenem, Imipenem, Meropenem Moderate growth    Antimicrobial Susceptibility Comment     Comment:       ** S = Susceptible; I = Intermediate; R = Resistant **                    P = Positive; N = Negative             MICS are expressed in micrograms per mL    Antibiotic                 RSLT#1    RSLT#2    RSLT#3    RSLT#4  Ciprofloxacin                  S Clindamycin                    S Erythromycin                   R Gentamicin                     S Levofloxacin                   S Linezolid                      S Moxifloxacin                   S Oxacillin                      S Penicillin                     R Quinupristin/Dalfopristin      S Rifampin                       S Tetracycline                   S Trimethoprim/Sulfa             S Vancomycin                     S       PHQ2/9: Depression screen [facility] 2/9 01/28/2021 09/24/2020 08/26/2020 08/17/2020 06/18/2020  Decreased Interest 3 3 0 0 0  Down, Depressed, Hopeless 1 3 0 0 3  PHQ - 2 Score 4 6 0 0 3  Altered sleeping 2 2 - 3 2  Tired, decreased energy 3 3 - 1 3  Change in appetite 3 2 - 3 0  Feeling bad or failure about yourself  3 2 - 0 0  Trouble concentrating 0 0 - 0 0   Moving slowly or fidgety/restless 0 0 - 1 1  Suicidal thoughts 0 0 - 0 0  PHQ-9 Score 15 15 - 8 9  Difficult doing work/chores - Somewhat difficult - Not difficult at all Somewhat difficult  Some recent data might be hidden    phq 9 is positive   Fall Risk: Fall Risk  01/28/2021 09/24/2020 08/26/2020 08/17/2020 06/18/2020  Falls in the past year? 0 0 - 0 0  Comment - - - - -  Number falls in past yr: 0 0 0 0 0  Injury with Fall? 0 0 0 0 0  Risk Factor Category  - - - - -  Comment - - - - -  Risk for fall due to : - - Impaired balance/gait - -  Risk for fall due to: Comment - - - - -  Follow up - - Falls prevention discussed - -      Functional Status Survey: Is the patient deaf or have difficulty hearing?: No Does the patient have difficulty seeing, even when wearing glasses/contacts?: No Does the patient have difficulty concentrating, remembering, or making decisions?: No Does the patient have difficulty walking or climbing stairs?: No Does the patient have difficulty dressing or bathing?: No Does the patient have difficulty doing errands alone such as visiting a doctor's office or shopping?: No  Assessment & Plan  1. Atherosclerosis of native arteries of the extremities with ulceration ([name])  - icosapent Ethyl (VASCEPA) 1 g capsule; Take 2 capsules (2 g total) by mouth 2 (two) times daily.  Dispense: 360 capsule; Refill: 1 - AMB Referral to [facility]  2. Dyslipidemia associated with type 2 diabetes mellitus ([name])  - irbesartan-hydrochlorothiazide (AVALIDE) 300-12.5 MG tablet; Take 1 tablet by mouth daily.  Dispense: 90 tablet; Refill: 1 - rosuvastatin (CRESTOR) 20 MG tablet; Take 1 tablet (20 mg total) by mouth daily.  Dispense: 90 tablet; Refill: 1 - icosapent Ethyl (VASCEPA) 1 g capsule; Take 2 capsules (2 g total) by mouth 2 (two) times daily.  Dispense: 360 capsule; Refill: 1 - COMPLETE METABOLIC PANEL WITH GFR - AMB Referral to [facility]  3. Adult hypothyroidism  - levothyroxine (SYNTHROID) 25 MCG tablet; Take 1 tablet by mouth daily except, take 1 and 1/2 tablets by mouth on Sunday.  Dispense: 100 tablet; Refill: 0 - TSH - AMB Referral to [facility]  4. Benign hypertension  - metoprolol tartrate (LOPRESSOR) 25 MG tablet; Take 1 tablet (25 mg total) by mouth 2 (two) times daily.  Dispense: 180 tablet; Refill: 1  5. Obstructive sleep apnea  - modafinil (PROVIGIL) 100 MG tablet; Take 1 tablet (100 mg total) by mouth daily.  Dispense: 30 tablet; Refill: 3  6. MDD (major depressive disorder), recurrent episode, moderate ([name])  - modafinil (PROVIGIL) 100 MG tablet; Take 1 tablet (100 mg total) by mouth daily.  Dispense: 30 tablet; Refill: 3 - sertraline (ZOLOFT) 100 MG tablet; Take 1.5 tablets (150 mg total) by mouth daily.  Dispense: 135 tablet; Refill: 0 - AMB Referral to [facility]  7. Insomnia due to other mental disorder  - traZODone (DESYREL) 50 MG tablet; Take 1 tablet (50 mg total) by mouth at bedtime as needed for sleep.  Dispense: 90 tablet; Refill: 1  8. Neurogenic claudication ([place])   9. Chronic obstructive pulmonary disease, unspecified COPD type ([place])

## 2021-01-29 ENCOUNTER — Other Ambulatory Visit: Payer: Self-pay | Admitting: Family Medicine

## 2021-01-29 DIAGNOSIS — F331 Major depressive disorder, recurrent, moderate: Secondary | ICD-10-CM

## 2021-01-29 DIAGNOSIS — G4733 Obstructive sleep apnea (adult) (pediatric): Secondary | ICD-10-CM

## 2021-01-29 LAB — COMPLETE METABOLIC PANEL WITH GFR
AG Ratio: 2.3 (calc) (ref 1.0–2.5)
ALT: 21 U/L (ref 6–29)
AST: 18 U/L (ref 10–35)
Albumin: 4.5 g/dL (ref 3.6–5.1)
Alkaline phosphatase (APISO): 69 U/L (ref 37–153)
BUN/Creatinine Ratio: 38 (calc) — ABNORMAL HIGH (ref 6–22)
BUN: 50 mg/dL — ABNORMAL HIGH (ref 7–25)
CO2: 27 mmol/L (ref 20–32)
Calcium: 10 mg/dL (ref 8.6–10.4)
Chloride: 104 mmol/L (ref 98–110)
Creat: 1.33 mg/dL — ABNORMAL HIGH (ref 0.60–0.93)
GFR, Est African American: 46 mL/min/{1.73_m2} — ABNORMAL LOW (ref >=60)
GFR, Est Non African American: 40 mL/min/{1.73_m2} — ABNORMAL LOW (ref >=60)
Globulin: 2 g/dL (calc) (ref 1.9–3.7)
Glucose, Bld: 126 mg/dL — ABNORMAL HIGH (ref 65–99)
Potassium: 4.7 mmol/L (ref 3.5–5.3)
Sodium: 140 mmol/L (ref 135–146)
Total Bilirubin: 0.4 mg/dL (ref 0.2–1.2)
Total Protein: 6.5 g/dL (ref 6.1–8.1)

## 2021-01-29 LAB — TSH: TSH: 1.71 mIU/L (ref 0.40–4.50)

## 2021-01-31 ENCOUNTER — Telehealth: Payer: Self-pay | Admitting: *Deleted

## 2021-01-31 NOTE — Chronic Care Management (AMB) (Signed)
  Chronic Care Management   Note  01/31/2021 Name: Summer [name] MRN: [id number] DOB: 1951/05/29  Summer [name] is a 70 y.o. year old female who is a primary care patient of Steele Sizer, MD. I reached out to Summer [name] by phone today in response to a referral sent by [name] PCP, Steele Sizer, MD.     [name] was given information about Chronic Care Management services today including:  1. CCM service includes personalized support from designated clinical staff supervised by her physician, including individualized plan of care and coordination with other care providers 2. 24/7 contact phone numbers for assistance for urgent and routine care needs. 3. Service will only be billed when office clinical staff spend 20 minutes or more in a month to coordinate care. 4. Only one practitioner may furnish and bill the service in a calendar month. 5. The patient may stop CCM services at any time (effective at the end of the month) by phone call to the office staff. 6. The patient will be responsible for cost sharing (co-pay) of up to 20% of the service fee (after annual deductible is met).  Patient agreed to services and verbal consent obtained.   Follow up plan: Telephone appointment with care management team member scheduled for:02/03/2021  [facility]  Direct Dial: [phone]

## 2021-02-02 ENCOUNTER — Telehealth: Payer: Self-pay

## 2021-02-02 NOTE — Progress Notes (Signed)
Chronic Care Management Pharmacy Assistant   Name: Summer [name]  MRN: [id number] DOB: 06-Jun-1951  Chart Review for CPP on 02/03/2021.   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 09/24/2020 Dr.Sowles MD (PCP)- started Mondafinil 100 MG tablet; Take 1 tablet (100 mg total) by mouth daily. 08/26/2020 Reather Littler LPN (PCP Office) No Medication Changes Noted 08/17/2020 Dr. Dorris Fetch MD (PCP Office) No medication changes noted  Recent consult visits:  01/25/2021 Bonnetta Barry DPM (Podiatry)-No medication change noted. 01/11/2021 [name] DPM (Podiatry) start doxycycline 100 mg 2 times daily, start Flexeril 5 mg 3 times daily 01/03/2021 Dr. Gwen Pounds MD (Dermatology)- No Medication Change Noted 12/28/2020 [name] DPM (Podiatry) - Recommend Betadine ointment  12/17/2020 [name] DPM (Podiatry) - No medication Change noted. 12/14/2020 Whitney Meeler NP (Physical Medicine and Rehabilitation) -No medication change noted. 12/06/2020 Dr.Schnier MD (Vascular Surgery) - No medication Changes noted 11/05/2020 [name] DPM (Podiatry)- No medication Change Noted 11/04/2020 Sheppard Plumber NP (Vascular Surgery) - No medication change noted 10/26/2020 [name] DPM (Podiatry)- No Medication Change Noted  10/19/2020 [name] DPM (Podiatry)- No medication change Noted 10/14/2020 Dr. Cherylann Ratel MD (Nephrology)- No medication Change noted 10/08/2020 [name] DPM (Podiatry)- No Medication Change Noted 10/07/2020  Verdis Frederickson (Endocrinology)- sent in a prescription to the pharmacy w/ instructions to fill a 70/30 insulin for her that is on her formulary (Humalin, Novolin, ReliOn) (NPH Humalin per med list)  10/01/2020 [name] DPM (Podiaty)- No medication Change noted 09/30/2020 Dr. Gwen Pounds MD (Dermatology) - Start Doxycycline 100mg  po QD x 1 week 09/21/2020 11/19/2020 DPM (Podiatry) - No medication Change noted 09/14/2020 11/12/2020 DPM (podiatry) - start  Bactrim 800-160 mg  and Ciprofloxacin 500 mg 2 times daily 09/06/2020 09/08/2020 DPM (Podiatry) - No medication changes noted 09/02/2020 09/04/2020 DPM (Podiatry) Procedure - amputation metatarsal + Toe 08/31/2020 09/02/2020 DPM (Podiatry) start doxycycline 100 mg 2 times daily 08/27/2020 08/29/2020 NP (Cardiology) No medication changes noted 08/25/2020 08/27/2020 (General Surgery) No Medication Changes Noted  Hospital visits:  Medication Reconciliation was completed by comparing discharge summary, patient's EMR and Pharmacy list, and upon discussion with patient.  Admitted to the hospital on 11/23/2020 due to Atherosclerosis of artery of extremity with ulceration. Discharge date was 11/23/2020. Discharged from [facility].    New?Medications Started at [facility] Discharge:?? -started N/A  Medication Changes at Hospital Discharge: -Changed N/A  Medications Discontinued at Hospital Discharge: -Stopped N/A   Medications that remain the same after Hospital Discharge:??  -All other medications will remain the same.    Left Voice message to do initial question prior to patient appointment on 02/03/2021 for CCM at 10:30 am  with 02/05/2021 the Clinical pharmacist.   Medications: Outpatient Encounter Medications as of 02/02/2021  Medication Sig Note  . ACCU-CHEK SOFTCLIX LANCETS lancets    . acetaminophen (TYLENOL 8 HOUR) 650 MG CR tablet Take 1 tablet (650 mg total) by mouth every 8 (eight) hours as needed  for pain.   . Alcohol Swabs (B-D SINGLE USE SWABS REGULAR) PADS    . ASPIRIN LOW DOSE 81 MG EC tablet TAKE  1 TABLET (81 MG TOTAL) BY MOUTH DAILY.   Marland Kitchen Blood Glucose Monitoring Suppl (ACCU-CHEK AVIVA PLUS) w/Device KIT    . buPROPion (WELLBUTRIN XL) 150 MG 24 hr tablet Take 1 tablet (150 mg total) by mouth daily.   . Calcium Carb-Cholecalciferol (CALCIUM 600+D3 PO) Take by mouth.   . Cyanocobalamin (VITAMIN B12 PO) Take 1 tablet by mouth daily.    . cyclobenzaprine (FLEXERIL) 5 MG tablet Take 1 tablet (5 mg total) by mouth 3 (three) times daily as needed for muscle spasms. (Patient not taking: Reported on 01/28/2021)   . furosemide (LASIX) 20 MG tablet Take 1 tablet by mouth daily.   Marland Kitchen icosapent Ethyl (VASCEPA) 1 g capsule Take 2 capsules (2 g total) by mouth 2 (two) times daily.   . insulin NPH-regular Human (70-30) 100 UNIT/ML injection Inject 100 Units into the skin daily with supper. 08/26/2020: Pt only taking once daily at night   . irbesartan-hydrochlorothiazide (AVALIDE) 300-12.5 MG tablet Take 1 tablet by mouth daily.   Marland Kitchen levothyroxine (SYNTHROID) 25 MCG tablet Take 1 tablet by mouth daily except, take 1 and 1/2 tablets by mouth on Sunday.   . metFORMIN (GLUCOPHAGE) 500 MG tablet Take 500 mg by mouth daily.   . metoprolol tartrate (LOPRESSOR) 25 MG tablet Take 1 tablet (25 mg total) by mouth 2 (two) times daily.   . Misc Natural Products (OSTEO BI-FLEX ADV JOINT SHIELD PO) Take by mouth 2 (two) times daily.    . modafinil (PROVIGIL) 100 MG tablet Take 1 tablet (100 mg total) by mouth daily.   . montelukast (SINGULAIR) 10 MG tablet Take 1 tablet by mouth at bedtime.   Marland Kitchen omeprazole (PRILOSEC) 40 MG capsule Take 1 capsule by mouth daily.   . Potassium 99 MG TABS Take by mouth daily at 6 (six) AM.   . pregabalin (LYRICA) 300 MG capsule Take 300 mg by mouth 2 (two) times a day.   . Probiotic Product (PROBIOTIC-10 PO) Take by mouth.   Daryll Brod INSULIN SYRINGE 1ML/31G 31G X 5/16" 1 ML MISC    .  rosuvastatin (CRESTOR) 20 MG tablet Take 1 tablet (20 mg total) by mouth daily.   . sertraline (ZOLOFT) 100 MG tablet Take 1.5 tablets (150 mg total) by mouth daily.   . traZODone (DESYREL) 50 MG tablet Take 1 tablet (50 mg total) by mouth at bedtime as needed for sleep.   . vitamin C (ASCORBIC ACID) 500 MG tablet Take 500 mg by mouth 2 (two) times daily.    Marland Kitchen VITAMIN D, CHOLECALCIFEROL, PO Take 1,000 Units by mouth daily.     No facility-administered encounter medications on file as of 02/02/2021.   Star Rating Drugs: Irbesartan-HTCZ 300-12.5 mg last filled in 12/21/2020 for 30 days and 12/18/2020 for 30 day supply at [facility]. Metformin 500 mg last filled in 12/21/2020 for 30 days and 12/18/2020 for 30 day supply at [facility]. Rosuvastatin 20 mg last filled in 12/21/2020 for 30 days and 12/18/2020 for 30 day supply at [facility].  [place] Pharmacist Assistant [phone]

## 2021-02-03 ENCOUNTER — Telehealth: Payer: Medicare HMO

## 2021-02-03 NOTE — Progress Notes (Deleted)
Chronic Care Management Pharmacy Note  02/03/2021 Name:  Summer [name] MRN:  [id number] DOB:  1951/02/14  Subjective: Summer [name] is an 69 y.o. year old female who is a primary patient of Steele Sizer, MD.  The CCM team was consulted for assistance with disease management and care coordination needs.    Engaged with patient by telephone for initial visit in response to provider referral for pharmacy case management and/or care coordination services.   Consent to Services:  The patient was given the following information about Chronic Care Management services today, agreed to services, and gave verbal consent: 1. CCM service includes personalized support from designated clinical staff supervised by the primary care provider, including individualized plan of care and coordination with other care providers 2. 24/7 contact phone numbers for assistance for urgent and routine care needs. 3. Service will only be billed when office clinical staff spend 20 minutes or more in a month to coordinate care. 4. Only one practitioner may furnish and bill the service in a calendar month. 5.The patient may stop CCM services at any time (effective at the end of the month) by phone call to the office staff. 6. The patient will be responsible for cost sharing (co-pay) of up to 20% of the service fee (after annual deductible is met). Patient agreed to services and consent obtained.  Patient Care Team: Steele Sizer, MD as PCP - General (Family Medicine) End, Harrell Gave, MD as PCP - Cardiology (Cardiology) Gardiner Barefoot, DPM as Consulting Physician (Podiatry) Lonia Farber, MD as Consulting Physician (Endocrinology) Sharlet Salina, MD as Referring Physician (Physical Medicine and Rehabilitation) Germaine Pomfret, [facility] (Pharmacist)  Recent office visits: 01/28/2021 Dr.Sowles MD (PCP) AMB Referral to [facility] Coordination,start Icosapent Ethyl 2 g oral 2 times daily. 09/24/2020 Dr.Sowles MD  (PCP)- started Mondafinil 100 MG tablet; Take 1 tablet (100 mg total) by mouth daily. 08/26/2020 Clemetine Marker LPN (PCP Office) No Medication Changes Noted 08/17/2020 Dr. Roxan Hockey MD (PCP Office) No medication changes noted  Recent consult visits: 01/25/2021 Hollie Salk DPM (Podiatry)-No medication change noted. 12/14/2020 Whitney Meeler NP (Physical Medicine and Rehabilitation) -No medication change noted. 12/06/2020 Dr.Schnier MD (Vascular Surgery) - No medication Changes noted 10/14/2020 Dr. Holley Raring MD (Nephrology)- No medication Change noted 10/07/2020 Mee Hives (Endocrinology)- sent in a prescription to the pharmacy w/ instructions to fill a 70/30 insulin for her that is on her formulary (Humalin, Novolin, ReliOn) (NPH Humalin per med list)  08/27/2020 Laurann Montana NP (Cardiology) No medication changes noted  Hospital visits: Admitted to the hospital on 11/23/2020 due to Atherosclerosis of artery of extremity with ulceration. Discharge date was 11/23/2020. Discharged from [facility]?Medications Started at [facility] Discharge:?? -started N/A  Medication Changes at Hospital Discharge: -Changed N/A  Medications Discontinued at Hospital Discharge: -Stopped N/A   Medications that remain the same after Hospital Discharge:??  -All other medications will remain the same.     Objective:  Lab Results  Component Value Date   CREATININE 1.33 (H) 01/28/2021   BUN 50 (H) 01/28/2021   GFRNONAA 40 (L) 01/28/2021   GFRAA 46 (L) 01/28/2021   NA 140 01/28/2021   K 4.7 01/28/2021   CALCIUM 10.0 01/28/2021   CO2 27 01/28/2021   GLUCOSE 126 (H) 01/28/2021    Lab Results  Component Value Date/Time   HGBA1C 7.1 10/07/2020 12:00 AM   HGBA1C 6.7 (H) 05/13/2020 11:46 AM   HGBA1C 6.7 (A) 03/12/2020 11:37 AM   HGBA1C 6.0 (  A) 11/03/2019 10:09 AM   HGBA1C 7.2 (H) 04/02/2019 05:43 PM   HGBA1C 8.1 08/27/2018 02:56 PM   HGBA1C 8.1 (A) 08/27/2018 02:56 PM    HGBA1C 8.1 (A) 08/27/2018 02:56 PM   MICROALBUR 0.6 01/28/2018 03:52 PM   MICROALBUR 20 02/19/2017 10:40 AM   MICROALBUR 20 07/22/2015 02:05 PM    Last diabetic Eye exam:  Lab Results  Component Value Date/Time   HMDIABEYEEXA No Retinopathy 07/07/2020 12:00 AM    Last diabetic Foot exam:  Lab Results  Component Value Date/Time   HMDIABFOOTEX Dr. Honor Junes 12/19/2018 12:00 AM     Lab Results  Component Value Date   CHOL 209 (H) 05/13/2020   HDL 38 (L) 05/13/2020   LDLCALC 117 (H) 05/13/2020   TRIG 309 (H) 05/13/2020   CHOLHDL 5.5 (H) 05/13/2020    Hepatic Function Latest Ref Rng & Units 01/28/2021 09/24/2020 05/13/2020  Total Protein 6.1 - 8.1 g/dL 6.5 6.9 6.4  Albumin 3.7 - 4.7 g/dL - 4.4 4.5  AST 10 - 35 U/L _0 ALT 6 - 29 U/L _1 Alk Phosphatase 44 - 121 IU/L - 100 85  Total Bilirubin 0.2 - 1.2 mg/dL 0.4 0.3 0.3    Lab Results  Component Value Date/Time   TSH 1.71 01/28/2021 03:00 PM   TSH 2.27 09/24/2020 03:02 PM    CBC Latest Ref Rng & Units 05/13/2020 10/14/2019 04/02/2019  WBC 3.4 - 10.8 x10E3/uL 8.0 8.6 9.2  Hemoglobin 11.1 - 15.9 g/dL 11.1 11.4(L) 11.8(L)  Hematocrit 34.0 - 46.6 % 33.7(L) 33.7(L) 35.3(L)  Platelets 150 - 450 x10E3/uL 153 238 254    Lab Results  Component Value Date/Time   VD25OH 41.1 05/13/2020 11:46 AM   VD25OH 37 05/01/2018 02:18 PM    Clinical ASCVD: Yes  The ASCVD Risk score Mikey Bussing DC Jr., et al., 2013) failed to calculate for the following reasons:   The patient has a prior MI or stroke diagnosis    Depression screen [facility] 2/9 01/28/2021 09/24/2020 08/26/2020  Decreased Interest 3 3 0  Down, Depressed, Hopeless 1 3 0  PHQ - 2 Score 4 6 0  Altered sleeping 2 2 -  Tired, decreased energy 3 3 -  Change in appetite 3 2 -  Feeling bad or failure about yourself  3 2 -  Trouble concentrating 0 0 -  Moving slowly or fidgety/restless 0 0 -  Suicidal thoughts 0 0 -  PHQ-9 Score 15 15 -  Difficult doing work/chores - Somewhat  difficult -  Some recent data might be hidden     Social History   Tobacco Use  Smoking Status Never Smoker  Smokeless Tobacco Never Used   BP Readings from Last 3 Encounters:  01/28/21 122/74  12/17/20 132/63  12/06/20 121/62   Pulse Readings from Last 3 Encounters:  01/28/21 90  12/17/20 63  12/06/20 70   Wt Readings from Last 3 Encounters:  01/28/21 242 lb (109.8 kg)  12/06/20 242 lb (109.8 kg)  11/23/20 240 lb (108.9 kg)   BMI Readings from Last 3 Encounters:  01/28/21 44.26 kg/m  12/06/20 44.26 kg/m  11/23/20 43.90 kg/m    Assessment/Interventions: Review of patient past medical history, allergies, medications, health status, including review of consultants reports, laboratory and other test data, was performed as part of comprehensive evaluation and provision of chronic care management services.   SDOH:  (Social Determinants of Health) assessments and interventions performed: {yes/no:20286}  SDOH Screenings  Alcohol Screen: Low Risk   . Last Alcohol Screening Score (AUDIT): 0  Depression (PHQ2-9): Medium Risk  . PHQ-2 Score: 15  Financial Resource Strain: Low Risk   . Difficulty of Paying Living Expenses: Not hard at all  Food Insecurity: No Food Insecurity  . Worried About Charity fundraiser in the Last Year: Never true  . Ran Out of Food in the Last Year: Never true  Housing: Low Risk   . Last Housing Risk Score: 0  Physical Activity: Inactive  . Days of Exercise per Week: 0 days  . Minutes of Exercise per Session: 0 min  Social Connections: Moderately Isolated  . Frequency of Communication with Friends and Family: More than three times a week  . Frequency of Social Gatherings with Friends and Family: Once a week  . Attends Religious Services: Never  . Active Member of Clubs or Organizations: No  . Attends Archivist Meetings: Never  . Marital Status: Living with partner  Stress: No Stress Concern Present  . Feeling of Stress : Only a  little  Tobacco Use: Low Risk   . Smoking Tobacco Use: Never Smoker  . Smokeless Tobacco Use: Never Used  Transportation Needs: No Transportation Needs  . Lack of Transportation (Medical): No  . Lack of Transportation (Non-Medical): No    CCM Care Plan  Allergies  Allergen Reactions  . Codeine Other (See Comments)    "TRIPPED OUT"  DIDN'T LIKE THE [place]  . Atorvastatin     muscle pain  . Hydrocodone     itching  . Tramadol   . Latex Rash  . Zolpidem Other (See Comments)    Sleep walk    Medications Reviewed Today    Reviewed by Steele Sizer, MD (Physician) on 01/28/21 at 1454  Med List Status: <None>  Medication Order Taking? Sig Documenting Provider Last Dose Status Informant  ACCU-CHEK SOFTCLIX LANCETS lancets [id number]   [provider]  Active Self  acetaminophen (TYLENOL 8 HOUR) 650 MG CR tablet [id number] Yes Take 1 tablet (650 mg total) by mouth every 8 (eight) hours as needed for pain. Steele Sizer, MD Taking Active Self  Alcohol Swabs (B-D SINGLE USE SWABS REGULAR) PADS [id number] Yes  [provider] Taking Active Self  ASPIRIN LOW DOSE 81 MG EC tablet [id number] Yes TAKE 1 TABLET (81 MG TOTAL) BY MOUTH DAILY. Steele Sizer, MD Taking Active Self  Blood Glucose Monitoring Suppl (ACCU-CHEK AVIVA PLUS) w/Device Drucie Opitz [id number] Yes  [provider] Taking Active Self           Med Note Marcello Moores, CONNELLAE H   Wed Apr 02, 2019  7:13 PM)    buPROPion (WELLBUTRIN XL) 150 MG 24 hr tablet [id number] Yes Take 1 tablet (150 mg total) by mouth daily. Steele Sizer, MD Taking Active   Calcium Carb-Cholecalciferol (CALCIUM 600+D3 PO) [id number] Yes Take by mouth. [provider] Taking Active   Cyanocobalamin (VITAMIN B12 PO) [id number] Yes Take 1 tablet by mouth daily.  [provider] Taking Active Self  cyclobenzaprine (FLEXERIL) 5 MG tablet [id number] No Take 1 tablet (5 mg total) by mouth 3 (three) times daily as needed for  muscle spasms.  Patient not taking: Reported on 01/28/2021   Edrick Kins, DPM Not Taking Active   furosemide (LASIX) 20 MG tablet [id number] Yes Take 1 tablet by mouth daily. Steele Sizer, MD Taking Active   icosapent Ethyl (VASCEPA) 1 g capsule [id number] Yes Take 2 capsules (2  g total) by mouth 2 (two) times daily. Steele Sizer, MD  Active   insulin NPH-regular Human (70-30) 100 UNIT/ML injection [id number] Yes Inject 100 Units into the skin daily with supper. Lonia Farber, MD Taking Active Self           Med Note Clemetine Marker D   Thu Aug 26, 2020  3:43 PM) Pt only taking once daily at night   irbesartan-hydrochlorothiazide (AVALIDE) 300-12.5 MG tablet [id number]  Take 1 tablet by mouth daily. Steele Sizer, MD  Active   levothyroxine (SYNTHROID) 25 MCG tablet [id number]  Take 1 tablet by mouth daily except, take 1 and 1/2 tablets by mouth on Sunday. Steele Sizer, MD  Active   metFORMIN (GLUCOPHAGE) 500 MG tablet [id number] Yes Take 500 mg by mouth daily. [provider] Taking Active   metoprolol tartrate (LOPRESSOR) 25 MG tablet [id number]  Take 1 tablet (25 mg total) by mouth 2 (two) times daily. Steele Sizer, MD  Active   Misc Natural Products (OSTEO BI-FLEX ADV JOINT SHIELD PO) [id number] Yes Take by mouth 2 (two) times daily.  [provider] Taking Active Self  modafinil (PROVIGIL) 100 MG tablet [id number]  Take 1 tablet (100 mg total) by mouth daily. Steele Sizer, MD  Active   montelukast (SINGULAIR) 10 MG tablet [id number] Yes Take 1 tablet by mouth at bedtime. Steele Sizer, MD Taking Active   omeprazole (PRILOSEC) 40 MG capsule [id number] Yes Take 1 capsule by mouth daily. Steele Sizer, MD Taking Active   Potassium 99 MG TABS [id number] Yes Take by mouth daily at 6 (six) AM. [provider] Taking Active   pregabalin (LYRICA) 300 MG capsule [id number] Yes Take 300 mg by mouth 2 (two) times a day. [provider] Taking Active Self   Probiotic Product (PROBIOTIC-10 PO) [id number] Yes Take by mouth. [provider] Taking Active Self  RELION INSULIN SYRINGE 1ML/31G 31G X 5/16" 1 ML [place] [id number] Yes  [provider] Taking Active Self           Med Note Marcello Moores, CONNELLAE H   Wed Apr 02, 2019  7:14 PM)    rosuvastatin (CRESTOR) 20 MG tablet [id number]  Take 1 tablet (20 mg total) by mouth daily. Steele Sizer, MD  Active   sertraline (ZOLOFT) 100 MG tablet [id number]  Take 1.5 tablets (150 mg total) by mouth daily. Steele Sizer, MD  Active   traZODone (DESYREL) 50 MG tablet [id number]  Take 1 tablet (50 mg total) by mouth at bedtime as needed for sleep. Steele Sizer, MD  Active   vitamin C (ASCORBIC ACID) 500 MG tablet [id number] Yes Take 500 mg by mouth 2 (two) times daily.  [provider] Taking Active Self  VITAMIN D, CHOLECALCIFEROL, PO [id number] Yes Take 1,000 Units by mouth daily.  [provider] Taking Active Self          Patient Active Problem List   Diagnosis Date Noted  . Chronic obstructive pulmonary disease ([place]) 01/28/2021  . Atherosclerosis of native arteries of the extremities with ulceration ([place]) 12/03/2020  . Diabetes mellitus ([place]) 05/24/2020  . B12 deficiency 05/24/2020  . Vitamin D deficiency 05/24/2020  . Metatarsalgia of left foot 01/15/2020  . Major depressive disorder, recurrent episode, moderate ([place]) 04/04/2019  . Left arm weakness 04/03/2019  . (HFpEF) heart failure with preserved ejection fraction ([place]) 08/31/2017  . Moderate persistent asthma 08/31/2017  . Hyperlipidemia associated with type 2 diabetes mellitus ([place]) 06/14/2017  . Dyslipidemia associated with type  2 diabetes mellitus ([place]) 08/18/2016  . Charcot foot due to diabetes mellitus ([place]) 02/22/2016  . Acquired abduction deformity of foot 07/12/2015  . Osteoarthritis of subtalar joint 07/12/2015  . Poorly controlled type 2 diabetes mellitus with neuropathy ([place]) 07/12/2015  .  Arthritis of foot, degenerative 07/12/2015  . Carpal tunnel syndrome 04/17/2015  . Chronic constipation 04/17/2015  . Insomnia, persistent 04/17/2015  . Stage 3 chronic kidney disease ([place]) 04/17/2015  . Decreased exercise tolerance 04/17/2015  . Diabetes mellitus with polyneuropathy ([place]) 04/17/2015  . Gastro-esophageal reflux disease without esophagitis 04/17/2015  . Bursitis, trochanteric 04/17/2015  . Cephalalgia 04/17/2015  . Hypertension associated with diabetes ([place]) 04/17/2015  . Adult hypothyroidism 04/17/2015  . Hearing loss 04/17/2015  . Chronic recurrent major depressive disorder ([place]) 04/17/2015  . Neurogenic claudication ([place]) 04/17/2015  . Class 3 severe obesity with serious comorbidity and body mass index (BMI) of 45.0 to 49.9 in adult ([place]) 04/17/2015  . Hypo-ovarianism 04/17/2015  . Perennial allergic rhinitis with seasonal variation 04/17/2015  . Acne erythematosa 04/17/2015  . Dyskinesia, tardive 04/17/2015  . Memory loss 04/17/2015  . Impingement syndrome of shoulder 04/17/2015  . Dermatitis, stasis 04/17/2015  . Obstructive sleep apnea 05/14/2014  . Shortness of breath on exertion 05/06/2014  . Mixed hyperlipidemia 02/06/2012  . LBP (low back pain) 09/16/2008    Immunization History  Administered Date(s) Administered  . Fluad Quad(high Dose 65+) 06/03/2019, 06/18/2020  . Influenza Split 06/13/2013, 06/16/2014  . Influenza, High Dose Seasonal PF 05/24/2015, 06/27/2017, 05/27/2018  . Influenza, Seasonal, Injecte, Preservative Fre 06/03/2010, 07/25/2011  . Influenza-Unspecified 07/21/2016  . PFIZER(Purple Top)SARS-COV-2 Vaccination 11/07/2019, 12/02/2019  . Pneumococcal Conjugate-13 07/14/2013  . Pneumococcal Polysaccharide-23 10/01/2006, 06/16/2014  . Tdap 03/04/2010, 07/20/2015  . Zoster 08/12/2012    Conditions to be addressed/monitored:  Hypertension, Hyperlipidemia, Diabetes, Atrial Fibrillation, Heart Failure, Coronary Artery Disease, GERD, COPD,  Asthma, Chronic Kidney Disease, Hypothyroidism and Insomnia  There are no care plans that you recently modified to display for this patient.    Medication Assistance: {MEDASSISTANCEINFO:25044}  Compliance/Adherence/Medication fill history: Care Gaps: ***  Star-Rating Drugs: Irbesartan-HTCZ 300-12.5 mg last filled in 12/21/2020 for 30 days and 12/18/2020 for 30 day supply at [facility]. Metformin 500 mg last filled in 12/21/2020 for 30 days and 12/18/2020 for 30 day supply at [facility]. Rosuvastatin 20 mg last filled in 12/21/2020 for 30 days and 12/18/2020 for 30 day supply at [facility].  Patient's preferred pharmacy is:  [facility] [address] [place] [address] Phone: [phone] Fax: [phone]  [facility] [place], [place] [place] [address] Phone: [phone] Fax: [phone]  [place], [place] [address] Phone: [phone] Fax: [phone]  Uses pill box? {Yes or If no, why not?:20788} Pt endorses ***% compliance  We discussed: {Pharmacy options:24294} Patient decided to: {US Pharmacy Plan:23885}  Care Plan and Follow Up Patient Decision:  {FOLLOWUP:24991}  Plan: {CM FOLLOW UP WHQP:59163}  ***   Current Barriers:  . {pharmacybarriers:24917}  Pharmacist Clinical Goal(s):  Marland Kitchen Patient will {PHARMACYGOALCHOICES:24921} through collaboration with PharmD and provider.   Interventions: . 1:1 collaboration with Steele Sizer, MD regarding development and update of comprehensive plan of care as evidenced by provider attestation and co-signature . Inter-disciplinary care team collaboration (see longitudinal plan of care) . Comprehensive medication review performed; medication list updated in  electronic medical record  Diabetes (A1c goal {A1c goals:23924}) -{US  controlled/uncontrolled:25276} -Current medications: . Insulin 70/30 100 units with supper  . Metformin 500 mg daily  -Medications previously tried: ***  -Current home glucose readings . fasting glucose: *** . post prandial glucose: *** -{ACTIONS;DENIES/REPORTS:21021675::"Denies"} hypoglycemic/hyperglycemic symptoms -Current meal patterns:  . breakfast: ***  . lunch: ***  . dinner: *** . snacks: *** . drinks: *** -Current exercise: *** -Educated on {CCM DM COUNSELING:25123} -Counseled to check feet daily and get yearly eye exams -{CCMPHARMDINTERVENTION:25122}  Heart Failure (Goal: manage symptoms and prevent exacerbations) -{US controlled/uncontrolled:25276} -Last ejection fraction: *** (Date: ***) -HF type: {type of heart failure:30421350} -NYHA Class: {CHL HP Upstream Pharm NYHA [phone]} -AHA HF Stage: {CHL HP Upstream Pharm AHA HF Stage:[phone]} -Current treatment: . Furosmide 20 mg daily  . Irbesartan-HCTZ 300-12.5 mg daily  . Metoprolol tartrate 25 mg twice daily  -Medications previously tried: ***  -Current home BP/HR readings: *** -Current dietary habits: *** -Current exercise habits: *** -Educated on {CCM HF Counseling:25125} -{CCMPHARMDINTERVENTION:25122}  Hyperlipidemia: (LDL goal < ***) -{US controlled/uncontrolled:25276} -Current treatment: . Vascepa 2 g twice daily  . Rosuvastatin 40 mg daily   -Current treatment: . Aspirin 81 mg daily  -Medications previously tried: ***  -Current dietary patterns: *** -Current exercise habits: *** -Educated on {CCM HLD Counseling:25126} -{CCMPHARMDINTERVENTION:25122}  Asthma/COPD (Goal: control symptoms and prevent exacerbations) -{US controlled/uncontrolled:25276} -Current treatment  . Montelukast 10 mg nightly  -Medications previously tried: Albuterol, Breo, Advair, Dulera,   -Gold Grade: {CHL HP Upstream Pharm COPD Gold YIFOY:[id number]} -Current COPD Classification:  {CHL HP Upstream Pharm COPD  Classification:[phone]} -MMRC/CAT score: *** -Pulmonary function testing: *** -Exacerbations requiring treatment in last 6 months: *** -Patient {Actions; denies-reports:120008} consistent use of maintenance inhaler -Frequency of rescue inhaler use: *** -Counseled on {CCMINHALERCOUNSELING:25121} -{CCMPHARMDINTERVENTION:25122}  Depression/Anxiety/Insomnia (Goal: ***) -{US controlled/uncontrolled:25276} -Current treatment: . Wellbutrin XL 150 mg daily  . Modafinil 100 mg daily  . Sertraline 100 mg 1.5 tablets daily  . Trazodone 50 mg nightly as needed  -Medications previously tried/failed: *** -PHQ9: *** -GAD7: *** -Connected with *** for mental health support -Educated on {CCM mental health counseling:25127} -{CCMPHARMDINTERVENTION:25122}  Hypothyroidism (Goal: Maintain stable thyroid function) -{US controlled/uncontrolled:25276} -Current treatment  . Levothyroxine 25 mcg 1.5 tablets on Sunday, 1 tablet all other days  -Medications previously tried: ***  -{CCMPHARMDINTERVENTION:25122}  *** (Goal: ***) -{US controlled/uncontrolled:25276} -Current treatment  . Omeprazole 40 mg daily  -Medications previously tried: ***  -{CCMPHARMDINTERVENTION:25122}  Chronic Pain (Goal: ***) -{US controlled/uncontrolled:25276} -Current treatment  . Acetaminophen CR 650 mg every 8 hours as needed  . Cyclobenzaprine 5 mg three times daily as needed . Pregabalin 300 mg twice daily  -Medications previously tried: ***  -{CCMPHARMDINTERVENTION:25122}   Health Maintenance -Vaccine gaps: *** -Current therapy:  . Calcium + Vit D  . Vitamin B12  . Osteo Bi-Flex twice daily  . Potassium 99 mg daily  . Vitamin C 500 mg twice daily  . Vitamin D 1000 units daily  -Educated on {ccm supplement counseling:25128} -{CCM Patient satisfied:25129} -{CCMPHARMDINTERVENTION:25122}     Patient Goals/Self-Care Activities . Patient will:  - {pharmacypatientgoals:24919}  Follow Up Plan: {CM  FOLLOW UP [facility]:94709}

## 2021-02-15 ENCOUNTER — Ambulatory Visit (INDEPENDENT_AMBULATORY_CARE_PROVIDER_SITE_OTHER): Payer: Medicare HMO | Admitting: Podiatry

## 2021-02-15 ENCOUNTER — Other Ambulatory Visit: Payer: Self-pay

## 2021-02-15 DIAGNOSIS — L97512 Non-pressure chronic ulcer of other part of right foot with fat layer exposed: Secondary | ICD-10-CM

## 2021-02-15 DIAGNOSIS — S91202A Unspecified open wound of left great toe with damage to nail, initial encounter: Secondary | ICD-10-CM | POA: Diagnosis not present

## 2021-02-15 MED ORDER — GENTAMICIN SULFATE 0.1 % EX CREA: 1.0000 "application " | TOPICAL_CREAM | Freq: Two times a day (BID) | CUTANEOUS | 1 refills | Status: DC

## 2021-02-15 NOTE — Progress Notes (Signed)
Subjective:  70 y.o. female with PMHx of diabetes mellitus presenting today for follow-up evaluation of an ulcer that has developed to the dorsal aspect of the right second toe.  Patient underwent arthroplasty surgery on 12/09/2020 of the DIPJ right second digit.  Over time an ulcer is developed.  She states that she is doing well and she has been applying the Betadine ointment with a light dressing daily.  She is wearing her diabetic shoes today.  Patient has a new complaint regarding an injury to the left great toenail.  Patient states that she stubbed her toe and now her toenail is loosely adhered.  She has no pain associated to the area since she is completely neuropathic.  This occurred a couple of days ago.  She presents for further treatment and evaluation   Past Medical History:  Diagnosis Date  . Anemia   . Arthritis   . Asthma   . B12 deficiency   . Back pain   . Carpal tunnel syndrome   . Chronic kidney disease   . Constipation   . Depression   . Depressive disorder   . Diabetes mellitus   . Dyspnea   . Fluid retention   . Foot pain   . GERD (gastroesophageal reflux disease)   . Headache   . History of hiatal hernia   . Hyperlipidemia   . Hypertension   . IBS (irritable bowel syndrome)   . Insomnia   . Joint pain   . Lumbago   . Memory loss   . Obesity   . 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)   . Pneumonia   . Rhinitis, allergic   . Rosacea   . Sleep apnea   . SOB (shortness of breath)   . Thyroid disease   . Unspecified hearing loss   . Unspecified hereditary and idiopathic peripheral neuropathy   . Unspecified sleep apnea   . Vitamin D deficiency       Objective/Physical Exam General: The patient is alert and oriented x3 in no acute distress.  Dermatology:  Wound #1 noted to the dorsal aspect right second toe measuring approximately 0.8 x 1.0 x 0.2 cm (LxWxD).   To the noted ulceration(s), there is no eschar. There is a moderate amount of slough, fibrin,  and necrotic tissue noted. Granulation tissue and wound base is red. There is a minimal amount of serosanguineous drainage noted. There is no exposed bone muscle-tendon ligament or joint. There is no malodor. Periwound integrity is intact. Skin is warm, dry and supple bilateral lower extremities.  Loosely adhered left hallux nail plate with underlying bleeding noted.  No purulence.  There is no associated pain symptoms patient is completely neuropathic  Vascular: Palpable pedal pulses bilaterally. No edema or erythema noted. Capillary refill within normal limits.  Neurological: Epicritic and protective threshold diminished bilaterally.   Musculoskeletal Exam: H/o LT 5th toe amputation  Assessment: 1.  Ulcer right second toe secondary to diabetes mellitus 2. diabetes mellitus w/ peripheral neuropathy 3.  Partially detached traumatic loss of left great toenail plate   Plan of Care:  1. Patient was evaluated. 2. medically necessary excisional debridement including subcutaneous tissue was performed using a tissue nipper and a chisel blade. Excisional debridement of all the necrotic nonviable tissue down to healthy bleeding viable tissue was performed with post-debridement measurements same as pre-. 3. the wound was cleansed and dry sterile dressing applied. 4.  Today the left hallux nail plate was removed in toto.  It was partially  detached and it was in the patient's best interest to remove the nail completely.  The toe was prepped in aseptic manner.  No anesthesia was utilized since the patient is completely neuropathic.  The nail was avulsed in toto and a light dressing was applied post care instructions provided.   5.  Prescription for gentamicin cream  6.  Patient is to return to clinic in 3 weeks   Edrick Kins, DPM [facility]  Dr. Edrick Kins, DPM    [address], [place] [address]                Office [phone]   Fax [phone]

## 2021-02-24 ENCOUNTER — Other Ambulatory Visit: Payer: Self-pay | Admitting: Family Medicine

## 2021-02-24 DIAGNOSIS — F331 Major depressive disorder, recurrent, moderate: Secondary | ICD-10-CM

## 2021-02-24 DIAGNOSIS — N1832 Chronic kidney disease, stage 3b: Secondary | ICD-10-CM

## 2021-02-24 NOTE — Telephone Encounter (Signed)
Requested Prescriptions  Pending Prescriptions Disp Refills  . buPROPion (WELLBUTRIN XL) 150 MG 24 hr tablet [Pharmacy Med Name: Bupropion Hydrochloride 150mg  Extended-Release (XL) Tablet] 90 tablet 0    Sig: Take 1 tablet by mouth daily.     Psychiatry: Antidepressants - bupropion Passed - 02/24/2021  5:47 AM      Passed - Completed PHQ-2 or PHQ-9 in the last 360 days      Passed - Last BP in normal range    BP Readings from Last 1 Encounters:  01/28/21 122/74         Passed - Valid encounter within last 6 months    Recent Outpatient Visits          3 weeks ago Atherosclerosis of native arteries of the extremities with ulceration [facility])   [facility] Steele Sizer, MD   5 months ago Dyslipidemia associated with type 2 diabetes mellitus [facility])   [facility] Steele Sizer, MD   6 months ago Cough   [place], MD   8 months ago Need for influenza vaccination   [facility] Steele Sizer, MD   11 months ago Dyslipidemia associated with type 2 diabetes mellitus [facility])   [facility] Steele Sizer, MD      Future Appointments            In 3 months Ancil Boozer, Drue Stager, MD [facility], [place]   In 6 months  [facility]

## 2021-02-24 NOTE — Telephone Encounter (Signed)
Requested Prescriptions  Pending Prescriptions Disp Refills  . furosemide (LASIX) 20 MG tablet [Pharmacy Med Name: Furosemide 20mg  Tablet] 90 tablet 0    Sig: Take 1 tablet by mouth daily.     Cardiovascular:  Diuretics - Loop Failed - 02/24/2021  6:00 AM      Failed - Cr in normal range and within 360 days    Creat  Date Value Ref Range Status  01/28/2021 1.33 (H) 0.60 - 0.93 mg/dL Final    Comment:    For patients >72 years of age, the reference limit for Creatinine is approximately 13% higher for people identified as African-American. .    Creatinine, Urine  Date Value Ref Range Status  01/28/2018 41 20 - 275 mg/dL Final         Passed - K in normal range and within 360 days    Potassium  Date Value Ref Range Status  01/28/2021 4.7 3.5 - 5.3 mmol/L Final         Passed - Ca in normal range and within 360 days    Calcium  Date Value Ref Range Status  01/28/2021 10.0 8.6 - 10.4 mg/dL Final         Passed - Na in normal range and within 360 days    Sodium  Date Value Ref Range Status  01/28/2021 140 135 - 146 mmol/L Final  09/24/2020 141 134 - 144 mmol/L Final         Passed - Last BP in normal range    BP Readings from Last 1 Encounters:  01/28/21 122/74         Passed - Valid encounter within last 6 months    Recent Outpatient Visits          3 weeks ago Atherosclerosis of native arteries of the extremities with ulceration [facility])   [facility] Steele Sizer, MD   5 months ago Dyslipidemia associated with type 2 diabetes mellitus [facility])   [facility] Steele Sizer, MD   6 months ago Cough   [place], MD   8 months ago Need for influenza vaccination   [facility] Steele Sizer, MD   11 months ago Dyslipidemia associated with type 2 diabetes mellitus [facility])   [facility] Steele Sizer, MD      Future Appointments             In 3 months Ancil Boozer, Drue Stager, MD [facility], [place]   In 6 months  [place]

## 2021-02-28 DIAGNOSIS — D631 Anemia in chronic kidney disease: Secondary | ICD-10-CM | POA: Diagnosis not present

## 2021-02-28 DIAGNOSIS — I10 Essential (primary) hypertension: Secondary | ICD-10-CM | POA: Diagnosis not present

## 2021-02-28 DIAGNOSIS — N1832 Chronic kidney disease, stage 3b: Secondary | ICD-10-CM | POA: Diagnosis not present

## 2021-02-28 DIAGNOSIS — E1122 Type 2 diabetes mellitus with diabetic chronic kidney disease: Secondary | ICD-10-CM | POA: Diagnosis not present

## 2021-03-01 ENCOUNTER — Telehealth: Payer: Self-pay

## 2021-03-01 NOTE — Progress Notes (Signed)
Chronic Care Management Pharmacy Assistant   Name: Summer [name]  MRN: [id number] DOB: 1951/04/27  Chart Review for CPP for reschedule initial appointment  Rescheduled appointment to 03/10/2021 for a telephone visit with clinical pharmacist at 2:00 pm. Sent Message to scheduler.   Conditions to be addressed/monitored: CHF, HTN, HLD, COPD, DMII, CKD Stage 3, Atherosclerosis of native arterires of the extremities with ulceration,sleep apnea,asthma,GERD ,Chronic constipation,Hypothyroidism, Dyskinesia,osteoarthritis, Insomnia, Cephalagia, Neurogenic claudication,Memory loss, B12 deficiency, Vitamin D deficieny and Depression  Primary concerns for visit include: Overall Health   Recent office visits:  No recent Office Visit   Recent consult visits:  02/15/2021 [name]  (Podiatry) - No medication Changes Noted  Hospital visits:  None in previous 6 months  Have you seen any other providers since your last visit? no Any changes in your medications or health? no Any side effects from any medications? no Do you have an symptoms or problems not managed by your medications? no Any concerns about your health right now? Yes  Patient states her concern is "diabetes , well everything". Has your provider asked that you check blood pressure, blood sugar, or follow special diet at home? Yes  Patient states she checks her blood pressure at home ranging around 130/70.  Patient states she check her blood sugar at home ranging around 130-136. Do you get any type of exercise on a regular basis? No  Patient reports she is unable to exercise. Can you think of a goal you would like to reach for your health? None ID Do you have any problems getting your medications? no Is there anything that you would like to discuss during the appointment?   Overall Health  Please bring medications and supplements to appointment  Medications: Outpatient Encounter Medications as of 03/01/2021  Medication Sig  Note   ACCU-CHEK SOFTCLIX LANCETS lancets     acetaminophen (TYLENOL 8 HOUR) 650 MG CR tablet Take 1 tablet (650 mg total) by mouth every 8 (eight) hours as needed for pain.    Alcohol Swabs (B-D SINGLE USE SWABS REGULAR) PADS     ASPIRIN LOW DOSE 81 MG EC tablet TAKE 1 TABLET (81 MG TOTAL) BY MOUTH DAILY.    Blood Glucose Monitoring Suppl (ACCU-CHEK AVIVA PLUS) w/Device KIT     buPROPion (WELLBUTRIN XL) 150 MG 24 hr tablet Take 1 tablet by mouth daily.    Calcium Carb-Cholecalciferol (CALCIUM 600+D3 PO) Take by mouth.    Cyanocobalamin (VITAMIN B12 PO) Take 1 tablet by mouth daily.     cyclobenzaprine (FLEXERIL) 5 MG tablet Take 1 tablet (5 mg total) by mouth 3 (three) times daily as needed for muscle spasms. (Patient not taking: Reported on 01/28/2021)    furosemide (LASIX) 20 MG tablet Take 1 tablet by mouth daily.    gentamicin cream (GARAMYCIN) 0.1 % Apply 1 application topically 2 (two) times daily.    icosapent Ethyl (VASCEPA) 1 g capsule Take 2 capsules (2 g total) by mouth 2 (two) times daily.    insulin NPH-regular Human (70-30) 100 UNIT/ML injection Inject 100 Units into the skin daily with supper. 08/26/2020: Pt only taking once daily at night    irbesartan-hydrochlorothiazide (AVALIDE) 300-12.5 MG tablet Take 1 tablet by mouth daily.    levothyroxine (SYNTHROID) 25 MCG tablet Take 1 tablet by mouth daily except, take 1 and 1/2 tablets by mouth on Sunday.    metFORMIN (GLUCOPHAGE) 500 MG tablet Take 500 mg by mouth daily.    metoprolol tartrate (LOPRESSOR) 25  MG tablet Take 1 tablet (25 mg total) by mouth 2 (two) times daily.    Misc Natural Products (OSTEO BI-FLEX ADV JOINT SHIELD PO) Take by mouth 2 (two) times daily.     modafinil (PROVIGIL) 100 MG tablet Take 1 tablet (100 mg total) by mouth daily.    montelukast (SINGULAIR) 10 MG tablet Take 1 tablet by mouth at bedtime.    omeprazole (PRILOSEC) 40 MG capsule Take 1 capsule by mouth daily.    Potassium 99 MG TABS Take by mouth  daily at 6 (six) AM.    pregabalin (LYRICA) 300 MG capsule Take 300 mg by mouth 2 (two) times a day.    Probiotic Product (PROBIOTIC-10 PO) Take by mouth.    RELION INSULIN SYRINGE 1ML/31G 31G X 5/16" 1 ML MISC     rosuvastatin (CRESTOR) 20 MG tablet Take 1 tablet (20 mg total) by mouth daily.    sertraline (ZOLOFT) 100 MG tablet Take 1.5 tablets (150 mg total) by mouth daily.    traZODone (DESYREL) 50 MG tablet Take 1 tablet (50 mg total) by mouth at bedtime as needed for sleep.    vitamin C (ASCORBIC ACID) 500 MG tablet Take 500 mg by mouth 2 (two) times daily.     VITAMIN D, CHOLECALCIFEROL, PO Take 1,000 Units by mouth daily.     No facility-administered encounter medications on file as of 03/01/2021.    Care Gaps: None ID Star Rating Drugs: Irbesartan-HTCZ 300-12.5 mg last filled in 12/21/2020 for 30 days and 12/18/2020 for 30 day supply at [facility]. Metformin 500 mg last filled in 12/21/2020 for 30 days and 12/18/2020 for 30 day supply at [facility]. Rosuvastatin 20 mg last filled in 12/21/2020 for 30 days and 12/18/2020 for 30 day supply at [facility].  [place] Pharmacist Assistant [phone]

## 2021-03-08 ENCOUNTER — Other Ambulatory Visit: Payer: Self-pay

## 2021-03-08 ENCOUNTER — Ambulatory Visit: Payer: Medicare PPO | Admitting: Podiatry

## 2021-03-08 DIAGNOSIS — E0843 Diabetes mellitus due to underlying condition with diabetic autonomic (poly)neuropathy: Secondary | ICD-10-CM

## 2021-03-08 DIAGNOSIS — M79674 Pain in right toe(s): Secondary | ICD-10-CM | POA: Diagnosis not present

## 2021-03-08 DIAGNOSIS — M79675 Pain in left toe(s): Secondary | ICD-10-CM

## 2021-03-08 DIAGNOSIS — B351 Tinea unguium: Secondary | ICD-10-CM | POA: Diagnosis not present

## 2021-03-08 DIAGNOSIS — L97512 Non-pressure chronic ulcer of other part of right foot with fat layer exposed: Secondary | ICD-10-CM | POA: Diagnosis not present

## 2021-03-08 NOTE — Progress Notes (Signed)
Subjective:  70 y.o. female with PMHx of diabetes mellitus presenting today for follow-up evaluation of an ulcer that has developed to the dorsal aspect of the right second toe.  Patient underwent arthroplasty surgery on 12/09/2020 of the DIPJ right second digit.  Over time an ulcer is developed.  She states that she is doing well and she has been applying the gentamicin cream with a light dressing daily.  She is wearing her diabetic shoes today.  Patient is also complaining of hyperkeratotic dystrophic nails noted 1-5 bilateral.  She states that she needs her nails trimmed.  She is unable to do so.  They are symptomatic in shoes.  Past Medical History:  Diagnosis Date   Anemia    Arthritis    Asthma    B12 deficiency    Back pain    Carpal tunnel syndrome    Chronic kidney disease    Constipation    Depression    Depressive disorder    Diabetes mellitus    Dyspnea    Fluid retention    Foot pain    GERD (gastroesophageal reflux disease)    Headache    History of hiatal hernia    Hyperlipidemia    Hypertension    IBS (irritable bowel syndrome)    Insomnia    Joint pain    Lumbago    Memory loss    Obesity    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)    Pneumonia    Rhinitis, allergic    Rosacea    Sleep apnea    SOB (shortness of breath)    Thyroid disease    Unspecified hearing loss    Unspecified hereditary and idiopathic peripheral neuropathy    Unspecified sleep apnea    Vitamin D deficiency       Objective/Physical Exam General: The patient is alert and oriented x3 in no acute distress.  Dermatology:  Wound #1 noted to the dorsal aspect right second toe measuring approximately 0.8 x 1.0 x 0.2 cm (LxWxD).   To the noted ulceration(s), there is no eschar. There is a moderate amount of slough, fibrin, and necrotic tissue noted. Granulation tissue and wound base is red. There is a minimal amount of serosanguineous drainage noted. There is no exposed bone muscle-tendon  ligament or joint. There is no malodor. Periwound integrity is intact. Skin is warm, dry and supple bilateral lower extremities.  Hyperkeratotic dystrophic nails noted 1-5 bilateral with associated tenderness to palpation.  Nails are elongated and thickened and discolored.  Vascular: Palpable pedal pulses bilaterally. No edema or erythema noted. Capillary refill within normal limits.  Neurological: Epicritic and protective threshold diminished bilaterally.   Musculoskeletal Exam: H/o LT 5th toe amputation  Assessment: 1.  Ulcer right second toe secondary to diabetes mellitus 2. diabetes mellitus w/ peripheral neuropathy 3.  Pain due to onychomycosis of toenails bilateral   Plan of Care:  1. Patient was evaluated. 2. medically necessary excisional debridement including subcutaneous tissue was performed using a tissue nipper and a chisel blade. Excisional debridement of all the necrotic nonviable tissue down to healthy bleeding viable tissue was performed with post-debridement measurements same as pre-. 3. the wound was cleansed and dry sterile dressing applied.  Continue gentamicin cream 4.  Mechanical debridement of nails 1-5 bilateral was performed using a nail nipper without incident or bleeding 5.  Return to clinic 6 weeks  Edrick Kins, DPM [facility]  Dr. Edrick Kins, DPM    [address], [place] [address]                Office [phone]  Fax [phone]

## 2021-03-10 ENCOUNTER — Ambulatory Visit (INDEPENDENT_AMBULATORY_CARE_PROVIDER_SITE_OTHER): Payer: Medicare PPO

## 2021-03-10 DIAGNOSIS — M170 Bilateral primary osteoarthritis of knee: Secondary | ICD-10-CM

## 2021-03-10 DIAGNOSIS — E1169 Type 2 diabetes mellitus with other specified complication: Secondary | ICD-10-CM | POA: Diagnosis not present

## 2021-03-10 DIAGNOSIS — E1159 Type 2 diabetes mellitus with other circulatory complications: Secondary | ICD-10-CM | POA: Diagnosis not present

## 2021-03-10 DIAGNOSIS — I152 Hypertension secondary to endocrine disorders: Secondary | ICD-10-CM | POA: Diagnosis not present

## 2021-03-10 DIAGNOSIS — E039 Hypothyroidism, unspecified: Secondary | ICD-10-CM

## 2021-03-10 DIAGNOSIS — Z794 Long term (current) use of insulin: Secondary | ICD-10-CM | POA: Diagnosis not present

## 2021-03-10 DIAGNOSIS — E785 Hyperlipidemia, unspecified: Secondary | ICD-10-CM | POA: Diagnosis not present

## 2021-03-10 DIAGNOSIS — I7025 Atherosclerosis of native arteries of other extremities with ulceration: Secondary | ICD-10-CM

## 2021-03-10 DIAGNOSIS — F99 Mental disorder, not otherwise specified: Secondary | ICD-10-CM

## 2021-03-10 DIAGNOSIS — F331 Major depressive disorder, recurrent, moderate: Secondary | ICD-10-CM

## 2021-03-10 DIAGNOSIS — N1831 Chronic kidney disease, stage 3a: Secondary | ICD-10-CM

## 2021-03-10 DIAGNOSIS — E1142 Type 2 diabetes mellitus with diabetic polyneuropathy: Secondary | ICD-10-CM | POA: Diagnosis not present

## 2021-03-10 NOTE — Progress Notes (Signed)
Chronic Care Management Pharmacy Note  03/11/2021 Name:  Summer [name] MRN:  [id number] DOB:  [birth year]  Summary: Patient reports she is doing well overall. Patient reports she has continued to take amlodipine, appears to have been discontinued by PCP at last follow-up, but no documentation noted regarding this change. Patient reports polyuria since taking furosemide, but does not feel furosemide has improved peripheral edema. She still has some belching symptoms, chest pain symptoms few times weekly   Recommendations/Changes made from today's visit: -Recommended STOPPING furosemide (ineffective + increased risk of dehydration or AKI with HCTZ use)  -STOP Osteo Bi-Flex (ineffective)  Plan: CPP follow-up in 6 months  Subjective: Summer [name] is an 70 y.o. year old female who is a primary patient of Alba Cory, MD.  The CCM team was consulted for assistance with disease management and care coordination needs.    Engaged with patient by telephone for initial visit in response to provider referral for pharmacy case management and/or care coordination services.   Consent to Services:  The patient was given the following information about Chronic Care Management services today, agreed to services, and gave verbal consent: 1. CCM service includes personalized support from designated clinical staff supervised by the primary care provider, including individualized plan of care and coordination with other care providers 2. 24/7 contact phone numbers for assistance for urgent and routine care needs. 3. Service will only be billed when office clinical staff spend 20 minutes or more in a month to coordinate care. 4. Only one practitioner may furnish and bill the service in a calendar month. 5.The patient may stop CCM services at any time (effective at the end of the month) by phone call to the office staff. 6. The patient will be responsible for cost sharing (co-pay) of up to 20% of the service fee  (after annual deductible is met). Patient agreed to services and consent obtained.  Patient Care Team: Alba Cory, MD as PCP - General (Family Medicine) End, Cristal Deer, MD as PCP - Cardiology (Cardiology) Helane Gunther, DPM as Consulting Physician (Podiatry) Sherlon Handing, MD as Consulting Physician (Endocrinology) Merri Ray, MD as Referring Physician (Physical Medicine and Rehabilitation) Gaspar Cola, [facility] (Pharmacist)  Recent office visits: 01/28/21: Patient presented to Dr. Carlynn Purl for follow-up. Vascepa started. Albuterol, amlodipine, ciprofloxacin, diclofenac, doxycyline, duloxetine, metronidazole, miconazole, omega-3, tizanidine stopped.   Recent consult visits: 02/28/21: Patient presented to Dr. Cherylann Ratel (Nephrology) for follow-up.  02/15/2021 [name]  (Podiatry) - No medication Changes Noted  Hospital visits: None in previous 6 months   Objective:  Lab Results  Component Value Date   CREATININE 1.33 (H) 01/28/2021   BUN 50 (H) 01/28/2021   GFRNONAA 40 (L) 01/28/2021   GFRAA 46 (L) 01/28/2021   NA 140 01/28/2021   K 4.7 01/28/2021   CALCIUM 10.0 01/28/2021   CO2 27 01/28/2021   GLUCOSE 126 (H) 01/28/2021    Lab Results  Component Value Date/Time   HGBA1C 7.1 10/07/2020 12:00 AM   HGBA1C 6.7 (H) 05/13/2020 11:46 AM   HGBA1C 6.7 (A) 03/12/2020 11:37 AM   HGBA1C 6.0 (A) 11/03/2019 10:09 AM   HGBA1C 7.2 (H) 04/02/2019 05:43 PM   HGBA1C 8.1 08/27/2018 02:56 PM   HGBA1C 8.1 (A) 08/27/2018 02:56 PM   HGBA1C 8.1 (A) 08/27/2018 02:56 PM   MICROALBUR 0.6 01/28/2018 03:52 PM   MICROALBUR 20 02/19/2017 10:40 AM   MICROALBUR 20 07/22/2015 02:05 PM    Last diabetic Eye exam:  Lab Results  Component Value Date/Time   HMDIABEYEEXA No Retinopathy 07/07/2020 12:00 AM    Last diabetic Foot exam:  Lab Results  Component Value Date/Time   HMDIABFOOTEX Dr. Honor Junes 12/19/2018 12:00 AM     Lab Results  Component Value Date   CHOL 209 (H)  05/13/2020   HDL 38 (L) 05/13/2020   LDLCALC 117 (H) 05/13/2020   TRIG 309 (H) 05/13/2020   CHOLHDL 5.5 (H) 05/13/2020    Hepatic Function Latest Ref Rng & Units 01/28/2021 09/24/2020 05/13/2020  Total Protein 6.1 - 8.1 g/dL 6.5 6.9 6.4  Albumin 3.7 - 4.7 g/dL - 4.4 4.5  AST 10 - 35 U/L $Remo'18 15 14  'AUIdx$ ALT 6 - 29 U/L $Remo'21 14 14  'MFPek$ Alk Phosphatase 44 - 121 IU/L - 100 85  Total Bilirubin 0.2 - 1.2 mg/dL 0.4 0.3 0.3    Lab Results  Component Value Date/Time   TSH 1.71 01/28/2021 03:00 PM   TSH 2.27 09/24/2020 03:02 PM    CBC Latest Ref Rng & Units 05/13/2020 10/14/2019 04/02/2019  WBC 3.4 - 10.8 x10E3/uL 8.0 8.6 9.2  Hemoglobin 11.1 - 15.9 g/dL 11.1 11.4(L) 11.8(L)  Hematocrit 34.0 - 46.6 % 33.7(L) 33.7(L) 35.3(L)  Platelets 150 - 450 x10E3/uL 153 238 254    Lab Results  Component Value Date/Time   VD25OH 41.1 05/13/2020 11:46 AM   VD25OH 37 05/01/2018 02:18 PM    Clinical ASCVD: Yes  The ASCVD Risk score Mikey Bussing DC Jr., et al., 2013) failed to calculate for the following reasons:   The patient has a prior MI or stroke diagnosis    Depression screen [place] 2/9 01/28/2021 09/24/2020 08/26/2020  Decreased Interest 3 3 0  Down, Depressed, Hopeless 1 3 0  PHQ - 2 Score 4 6 0  Altered sleeping 2 2 -  Tired, decreased energy 3 3 -  Change in appetite 3 2 -  Feeling bad or failure about yourself  3 2 -  Trouble concentrating 0 0 -  Moving slowly or fidgety/restless 0 0 -  Suicidal thoughts 0 0 -  PHQ-9 Score 15 15 -  Difficult doing work/chores - Somewhat difficult -  Some recent data might be hidden    Social History   Tobacco Use  Smoking Status Never  Smokeless Tobacco Never   BP Readings from Last 3 Encounters:  01/28/21 122/74  12/17/20 132/63  12/06/20 121/62   Pulse Readings from Last 3 Encounters:  01/28/21 90  12/17/20 63  12/06/20 70   Wt Readings from Last 3 Encounters:  01/28/21 242 lb (109.8 kg)  12/06/20 242 lb (109.8 kg)  11/23/20 240 lb (108.9 kg)   BMI Readings  from Last 3 Encounters:  01/28/21 44.26 kg/m  12/06/20 44.26 kg/m  11/23/20 43.90 kg/m    Assessment/Interventions: Review of patient past medical history, allergies, medications, health status, including review of consultants reports, laboratory and other test data, was performed as part of comprehensive evaluation and provision of chronic care management services.   SDOH:  (Social Determinants of Health) assessments and interventions performed: Yes SDOH Interventions    Flowsheet Row Most Recent Value  SDOH Interventions   Financial Strain Interventions Intervention Not Indicated      SDOH Screenings   Alcohol Screen: Low Risk    Last Alcohol Screening Score (AUDIT): 0  Depression (PHQ2-9): Medium Risk   PHQ-2 Score: 15  Financial Resource Strain: Low Risk    Difficulty of Paying Living Expenses: Not hard at all  Food Insecurity: No  Food Insecurity   Worried About Charity fundraiser in the Last Year: Never true   Ran Out of Food in the Last Year: Never true  Housing: Low Risk    Last Housing Risk Score: 0  Physical Activity: Inactive   Days of Exercise per Week: 0 days   Minutes of Exercise per Session: 0 min  Social Connections: Moderately Isolated   Frequency of Communication with Friends and Family: More than three times a week   Frequency of Social Gatherings with Friends and Family: Once a week   Attends Religious Services: Never   Marine scientist or Organizations: No   Attends Archivist Meetings: Never   Marital Status: Living with partner  Stress: No Stress Concern Present   Feeling of Stress : Only a little  Tobacco Use: Low Risk    Smoking Tobacco Use: Never   Smokeless Tobacco Use: Never  Transportation Needs: No Transportation Needs   Lack of Transportation (Medical): No   Lack of Transportation (Non-Medical): No    CCM Care Plan  Allergies  Allergen Reactions   Codeine Other (See Comments)    "TRIPPED OUT"  DIDN'T LIKE THE WAY  IT FELT   Atorvastatin     muscle pain   Hydrocodone     itching   Tramadol    Latex Rash   Zolpidem Other (See Comments)    Sleep walk    Medications Reviewed Today     Reviewed by Germaine Pomfret, [facility] (Pharmacist) on 03/11/21 at 1034  Med List Status: <None>   Medication Order Taking? Sig Documenting Provider Last Dose Status Informant  ACCU-CHEK SOFTCLIX LANCETS lancets [id number]   [provider]  Active Self  acetaminophen (TYLENOL 8 HOUR) 650 MG CR tablet [id number] Yes Take 1 tablet (650 mg total) by mouth every 8 (eight) hours as needed for pain. Steele Sizer, MD Taking Active Self  Alcohol Swabs (B-D SINGLE USE SWABS REGULAR) PADS [id number]   [provider]  Active Self  amLODipine (NORVASC) 5 MG tablet [id number] Yes Take 5 mg by mouth daily. [provider] Taking Active   ASPIRIN LOW DOSE 81 MG EC tablet [id number] Yes TAKE 1 TABLET (81 MG TOTAL) BY MOUTH DAILY. Steele Sizer, MD Taking Active Self  Blood Glucose Monitoring Suppl (ACCU-CHEK AVIVA PLUS) w/Device Drucie Opitz [id number]   [provider]  Active Self           Med Note Marcello Moores, CONNELLAE H   Wed Apr 02, 2019  7:13 PM)    buPROPion (WELLBUTRIN XL) 150 MG 24 hr tablet [id number] Yes Take 1 tablet by mouth daily. Steele Sizer, MD Taking Active   Cholecalciferol (VITAMIN D3) 25 MCG (1000 UT) CAPS [id number] Yes Take 1,000 Units by mouth. [provider] Taking Active   Cyanocobalamin (VITAMIN B12 PO) [id number] Yes Take 1 tablet by mouth daily.  [provider] Taking Active Self  furosemide (LASIX) 20 MG tablet [id number] Yes Take 1 tablet by mouth daily. Steele Sizer, MD Taking Active   gentamicin cream (GARAMYCIN) 0.1 % [id number] Yes Apply 1 application topically 2 (two) times daily. Edrick Kins, DPM Taking Active   icosapent Ethyl (VASCEPA) 1 g capsule [id number] Yes Take 2 capsules (2 g total) by mouth 2 (two) times daily. Steele Sizer, MD Taking Active    insulin NPH-regular Human (70-30) 100 UNIT/ML injection [id number] Yes Inject 100 Units into the skin daily with supper. Lonia Farber, MD Taking Active Self  Med Note Ina Homes Aug 26, 2020  3:43 PM) Pt only taking once daily at night   irbesartan-hydrochlorothiazide (AVALIDE) 300-12.5 MG tablet [id number] Yes Take 1 tablet by mouth daily. Steele Sizer, MD Taking Active   levothyroxine (SYNTHROID) 25 MCG tablet [id number] Yes Take 1 tablet by mouth daily except, take 1 and 1/2 tablets by mouth on Sunday. Steele Sizer, MD Taking Active   metFORMIN (GLUCOPHAGE) 500 MG tablet [id number] Yes Take 1,000 mg by mouth daily with breakfast. [provider] Taking Active   metoprolol tartrate (LOPRESSOR) 25 MG tablet [id number] Yes Take 1 tablet (25 mg total) by mouth 2 (two) times daily. Steele Sizer, MD Taking Active   Misc Natural Products (OSTEO BI-FLEX ADV JOINT SHIELD PO) [id number] Yes Take by mouth 2 (two) times daily.  [provider] Taking Active Self  modafinil (PROVIGIL) 100 MG tablet [id number] Yes Take 1 tablet (100 mg total) by mouth daily. Steele Sizer, MD Taking Active   montelukast (SINGULAIR) 10 MG tablet [id number] Yes Take 1 tablet by mouth at bedtime. Steele Sizer, MD Taking Active   omeprazole (PRILOSEC) 40 MG capsule [id number] Yes Take 1 capsule by mouth daily. Steele Sizer, MD Taking Active   Potassium 99 MG TABS [id number] Yes Take by mouth daily at 6 (six) AM. [provider] Taking Active   pregabalin (LYRICA) 300 MG capsule [id number] Yes Take 300 mg by mouth 2 (two) times a day. [provider] Taking Active Self  Probiotic Product (PROBIOTIC-10 PO) [id number] Yes Take by mouth. [provider] Taking Active Self  RELION INSULIN SYRINGE 1ML/31G 31G X 5/16" 1 ML [place] [id number]   [provider]  Active Self           Med Note Marcello Moores, CONNELLAE H   Wed Apr 02, 2019  7:14 PM)     rosuvastatin (CRESTOR) 20 MG tablet [id number] Yes Take 1 tablet (20 mg total) by mouth daily. Steele Sizer, MD Taking Active   sertraline (ZOLOFT) 100 MG tablet [id number] Yes Take 1.5 tablets (150 mg total) by mouth daily. Steele Sizer, MD Taking Active   traZODone (DESYREL) 50 MG tablet [id number] Yes Take 1 tablet (50 mg total) by mouth at bedtime as needed for sleep. Steele Sizer, MD Taking Active   vitamin C (ASCORBIC ACID) 500 MG tablet [id number] Yes Take 500 mg by mouth 2 (two) times daily.  [provider] Taking Active Self            Patient Active Problem List   Diagnosis Date Noted   Chronic obstructive pulmonary disease ([place]) 01/28/2021   Atherosclerosis of native arteries of the extremities with ulceration ([place]) 12/03/2020   Diabetes mellitus ([place]) 05/24/2020   B12 deficiency 05/24/2020   Vitamin D deficiency 05/24/2020   Metatarsalgia of left foot 01/15/2020   Major depressive disorder, recurrent episode, moderate ([place]) 04/04/2019   Left arm weakness 04/03/2019   (HFpEF) heart failure with preserved ejection fraction ([place]) 08/31/2017   Moderate persistent asthma 08/31/2017   Hyperlipidemia associated with type 2 diabetes mellitus ([place]) 06/14/2017   Dyslipidemia associated with type 2 diabetes mellitus ([place]) 08/18/2016   Charcot foot due to diabetes mellitus ([place]) 02/22/2016   Acquired abduction deformity of foot 07/12/2015   Osteoarthritis of subtalar joint 07/12/2015   Poorly controlled type 2 diabetes mellitus with neuropathy ([place]) 07/12/2015   Arthritis of foot, degenerative 07/12/2015   Carpal tunnel syndrome 04/17/2015   Chronic constipation 04/17/2015   Insomnia, persistent 04/17/2015  Stage 3 chronic kidney disease ([place]) 04/17/2015   Decreased exercise tolerance 04/17/2015   Diabetes mellitus with polyneuropathy ([place]) 04/17/2015   Gastro-esophageal reflux disease without esophagitis 04/17/2015   Bursitis, trochanteric 04/17/2015    Cephalalgia 04/17/2015   Hypertension associated with diabetes ([place]) 04/17/2015   Adult hypothyroidism 04/17/2015   Hearing loss 04/17/2015   Chronic recurrent major depressive disorder ([place]) 04/17/2015   Neurogenic claudication ([place]) 04/17/2015   Class 3 severe obesity with serious comorbidity and body mass index (BMI) of 45.0 to 49.9 in adult ([place]) 04/17/2015   Hypo-ovarianism 04/17/2015   Perennial allergic rhinitis with seasonal variation 04/17/2015   Acne erythematosa 04/17/2015   Dyskinesia, tardive 04/17/2015   Memory loss 04/17/2015   Impingement syndrome of shoulder 04/17/2015   Dermatitis, stasis 04/17/2015   Obstructive sleep apnea 05/14/2014   Shortness of breath on exertion 05/06/2014   Mixed hyperlipidemia 02/06/2012   LBP (low back pain) 09/16/2008    Immunization History  Administered Date(s) Administered   Fluad Quad(high Dose 65+) 06/03/2019, 06/18/2020   Influenza Split 06/13/2013, 06/16/2014   Influenza, High Dose Seasonal PF 05/24/2015, 06/27/2017, 05/27/2018   Influenza, Seasonal, Injecte, Preservative Fre 06/03/2010, 07/25/2011   Influenza-Unspecified 07/21/2016   PFIZER(Purple Top)SARS-COV-2 Vaccination 11/07/2019, 12/02/2019   Pneumococcal Conjugate-13 07/14/2013   Pneumococcal Polysaccharide-23 10/01/2006, 06/16/2014   Tdap 03/04/2010, 07/20/2015   Zoster, Live 08/12/2012    Conditions to be addressed/monitored:  Hypertension, Hyperlipidemia, Diabetes, Coronary Artery Disease, Asthma, Chronic Kidney Disease, Hypothyroidism, Osteoarthritis, and Insomnia  Care Plan : General Pharmacy (Adult)  Updates made by Germaine Pomfret, RPH since 03/11/2021 12:00 AM     Problem: Hypertension, Hyperlipidemia, Diabetes, Coronary Artery Disease, Asthma, Chronic Kidney Disease, Hypothyroidism, Osteoarthritis, and Insomnia   Priority: High     Long-Range Goal: Patient-Specific Goal   Start Date: 03/11/2021  Expected End Date: 09/11/2021  This Visit's Progress: On  track  Priority: High  Note:   Current Barriers:  Suboptimal therapeutic regimen for hypertension Suboptimal therapeutic regimen for GERD  Pharmacist Clinical Goal(s):  Patient will maintain control of diabetes as evidenced by A1c less than 8%  maintain control of blood pressure as evidenced by BP less than 140/90  through collaboration with PharmD and provider.   Interventions: 1:1 collaboration with Steele Sizer, MD regarding development and update of comprehensive plan of care as evidenced by provider attestation and co-signature Inter-disciplinary care team collaboration (see longitudinal plan of care) Comprehensive medication review performed; medication list updated in electronic medical record  Hypertension (BP goal <140/90) -Controlled -Current treatment: Amlodipine 5 mg daily  Furosemide 20 mg daily  Irbesartan-HCTZ 300-12.5 mg daily  Metoprolol tartrate 25 mg twice daily  -Medications previously tried: NA  -Patient reports she has continued to take amlodipine, appears to have been discontinued by PCP at last follow-up, but no documentation noted regarding this change.  -Patient reports polyuria since taking furosemide, but does not feel furosemide has improved peripheral edema.  -Current home readings: NA -Current dietary habits: 10-15 glasses water/diet mountain dew. -Current exercise habits: Minimal exercise, limited by dyspnea   -Denies hypotensive/hypertensive symptoms -Educated on Daily salt intake goal < 2300 mg; -Counseled to monitor BP at home 2-3 times weekly, document, and provide log at future appointments -Recommended STOPPING furosemide (ineffective + increased risk of dehydration or AKI with HCTZ use)  -Will check with provider regarding amlodipine use.   Hyperlipidemia: (LDL goal < 70) -Controlled -Current treatment: Vascepa 1g 2 caps twice daily  Rosuvastatin 20 mg daily  -Current antiplatelet treatment:  Aspirin 81 mg daily  -Medications  previously tried: NA  -Educated on Importance of limiting foods high in cholesterol; -Recommended to continue current medication  Diabetes (A1c goal <8%) -Controlled -Diagnosed 2012  -Current medications: NPH 70/30 100 units with supper  Metformin 500 mg daily  -Medications previously tried: NA  -Current home glucose readings fasting glucose: 80-130  post prandial glucose: NA -Denies hypoglycemic/hyperglycemic symptoms -Counseled to check feet daily and get yearly eye exams -Recommended to continue current medication  Asthma (Goal: control symptoms and prevent exacerbations) -Controlled -Current treatment  Montelukast 10 mg nightly  -Medications previously tried: NA  -MMRC/CAT score: Short of breath walking from front door to car. Improves with rest after 5 minutes.  -Pulmonary function testing: FEV1/FVC 81%, FEV1 89% (2015)  -Exacerbations requiring treatment in last 6 months: None -Patient did not notice any improvement when using inhalers.  -Recommended to continue current medication  Depression/Anxiety (Goal: Maintain stable mood) -Controlled -Current treatment: Wellbutrin XL 150 mg daily  Modafinil 100 mg daily  Sertraline 100 mg 1.5 tablets daily  Trazodone 50 mg nightly as needed (takes nightly)  -Medications previously tried/failed: NA -waking up once most nights. Takes 1-2 hours before she falls back asleep. Watches TV prior to bedtime. Wakes up and plays games on laptop until she falls back asleep.  -Counseled patient on proper sleep hygiene habits.  -Educated on Benefits of medication for symptom control -Recommended to continue current medication  Hypothyroidism (Goal: Maintain stable thyroid function) -Controlled -Current treatment  Levothyroxine 25 mcg 1 tablet daily, 1.5 tablets on sundays  -Medications previously tried: NA  -Recommended to continue current medication  Osteoarthritis (Goal: Improve symptom control) -Controlled -Current treatment   Acetaminophen CR 650 mg twice daily  Osteo-Bi Flex  -Medications previously tried: NA -Has been taking osteo Bi-flex supplement for ~ 1 year with no noticeable improvement.  -Counseled on appropriate use of acetminophen for osteoarthritis and counseled to avoid doses greater than 3000 mg daily   -Recommended STOPPING Osteo Bi-Flex (ineffective)  GERD (Goal: Prevent Heartburn/Reflux) -Not ideally controlled -Current treatment  Omeprazole 40 mg daily (HS)  -Medications previously tried: Ranitidine  -Still has some belching symptoms, chest pain symptoms few times weekly  -Counseled on trigger identification and avoidance, not lying down for 30 minutes after eating  -Counseled to take omeprazole on empty stomach.  -Recommended to continue current medication  Chronic Kidney Disease Stage 3a  -All medications assessed for renal dosing and appropriateness in chronic kidney disease. -Rosuvastatin: Acceptable in eGFR >30. Could consider switching to atorvastatin if renal function declines  -Metformin dose appropriate given current kidney function, but will require frequent monitoring to ensure stable kidney function.  -Recommended to continue current medication   Patient Goals/Self-Care Activities Patient will:  - check glucose daily, document, and provide at future appointments check blood pressure weekly, document, and provide at future appointments  Follow Up Plan: Telephone follow up appointment with care management team member scheduled for:  08/24/2021 at 10:00 AM      Medication Assistance: None required.  Patient affirms current coverage meets needs.  Compliance/Adherence/Medication fill history: Care Gaps: Shingrix Covid Booster  Star-Rating Drugs: Irbesartan-HTCZ 300-12.5 mg last filled in 12/21/2020 for 30 days and 12/18/2020 for 30 day supply at [facility]. Metformin 500 mg last filled in 12/21/2020 for 30 days and 12/18/2020 for 30 day supply at [facility]. Rosuvastatin 20 mg last filled in 12/21/2020 for 30 days and 12/18/2020 for 30 day supply at [facility].  Patient's preferred  pharmacy is:  [facility] [address] [place] [address] [place] [address] Phone: [phone] Fax: [phone]  [facility], [address] [place] [address] Phone: [phone] Fax: [phone]  [place], [place] [address] Phone: [phone] Fax: [phone]  Uses pill box? No - Utilizes [facility] Pt endorses 100% compliance  We discussed: Current pharmacy is preferred with insurance plan and patient is satisfied with pharmacy services Patient decided to: Continue current medication management strategy  Care Plan and Follow Up Patient Decision:  Patient agrees to Care Plan and Follow-up.  Plan: Telephone follow up appointment with care management team member scheduled for:  08/24/2021 at 10:00 AM  Doristine Section, [facility] [phone]

## 2021-03-11 NOTE — Patient Instructions (Signed)
Visit Information It was great speaking with you today!  Please let me know if you have any questions about our visit.   Goals Addressed             This Visit's Progress    Monitor and Manage My Blood Sugar-Diabetes Type 2       Timeframe:  Long-Range Goal Priority:  High Start Date: 03/11/2021                             Expected End Date: 09/11/2021                      Follow Up Date 05/10/2021    - check blood sugar at prescribed times - check blood sugar if I feel it is too high or too low - take the blood sugar log to all doctor visits    Why is this important?   Checking your blood sugar at home helps to keep it from getting very high or very low.  Writing the results in a diary or log helps the doctor know how to care for you.  Your blood sugar log should have the time, date and the results.  Also, write down the amount of insulin or other medicine that you take.  Other information, like what you ate, exercise done and how you were feeling, will also be helpful.     Notes:          Patient Care Plan: General Pharmacy (Adult)     Problem Identified: Hypertension, Hyperlipidemia, Diabetes, Coronary Artery Disease, Asthma, Chronic Kidney Disease, Hypothyroidism, Osteoarthritis, and Insomnia   Priority: High     Long-Range Goal: Patient-Specific Goal   Start Date: 03/11/2021  Expected End Date: 09/11/2021  This Visit's Progress: On track  Priority: High  Note:   Current Barriers:  Suboptimal therapeutic regimen for hypertension Suboptimal therapeutic regimen for GERD  Pharmacist Clinical Goal(s):  Patient will maintain control of diabetes as evidenced by A1c less than 8%  maintain control of blood pressure as evidenced by BP less than 140/90  through collaboration with PharmD and provider.   Interventions: 1:1 collaboration with Steele Sizer, MD regarding development and update of comprehensive plan of care as evidenced by provider attestation and  co-signature Inter-disciplinary care team collaboration (see longitudinal plan of care) Comprehensive medication review performed; medication list updated in electronic medical record  Hypertension (BP goal <140/90) -Controlled -Current treatment: Amlodipine 5 mg daily  Furosemide 20 mg daily  Irbesartan-HCTZ 300-12.5 mg daily  Metoprolol tartrate 25 mg twice daily  -Medications previously tried: NA  -Patient reports she has continued to take amlodipine, appears to have been discontinued by PCP at last follow-up, but no documentation noted regarding this change.  -Patient reports polyuria since taking furosemide, but does not feel furosemide has improved peripheral edema.  -Current home readings: NA -Current dietary habits: 10-15 glasses water/diet mountain dew. -Current exercise habits: Minimal exercise, limited by dyspnea   -Denies hypotensive/hypertensive symptoms -Educated on Daily salt intake goal < 2300 mg; -Counseled to monitor BP at home 2-3 times weekly, document, and provide log at future appointments -Recommended STOPPING furosemide (ineffective + increased risk of dehydration or AKI with HCTZ use)  -Will check with provider regarding amlodipine use.   Hyperlipidemia: (LDL goal < 70) -Controlled -Current treatment: Vascepa 1g 2 caps twice daily  Rosuvastatin 20 mg daily  -Current antiplatelet treatment: Aspirin 81 mg daily  -Medications  previously tried: NA  -Educated on Importance of limiting foods high in cholesterol; -Recommended to continue current medication  Diabetes (A1c goal <8%) -Controlled -Diagnosed 2012  -Current medications: NPH 70/30 100 units with supper  Metformin 500 mg daily  -Medications previously tried: NA  -Current home glucose readings fasting glucose: 80-130  post prandial glucose: NA -Denies hypoglycemic/hyperglycemic symptoms -Counseled to check feet daily and get yearly eye exams -Recommended to continue current medication  Asthma  (Goal: control symptoms and prevent exacerbations) -Controlled -Current treatment  Montelukast 10 mg nightly  -Medications previously tried: NA  -MMRC/CAT score: Short of breath walking from front door to car. Improves with rest after 5 minutes.  -Pulmonary function testing: FEV1/FVC 81%, FEV1 89% (2015)  -Exacerbations requiring treatment in last 6 months: None -Patient did not notice any improvement when using inhalers.  -Recommended to continue current medication  Depression/Anxiety (Goal: Maintain stable mood) -Controlled -Current treatment: Wellbutrin XL 150 mg daily  Modafinil 100 mg daily  Sertraline 100 mg 1.5 tablets daily  Trazodone 50 mg nightly as needed (takes nightly)  -Medications previously tried/failed: NA -waking up once most nights. Takes 1-2 hours before she falls back asleep. Watches TV prior to bedtime. Wakes up and plays games on laptop until she falls back asleep.  -Counseled patient on proper sleep hygiene habits.  -Educated on Benefits of medication for symptom control -Recommended to continue current medication  Hypothyroidism (Goal: Maintain stable thyroid function) -Controlled -Current treatment  Levothyroxine 25 mcg 1 tablet daily, 1.5 tablets on sundays  -Medications previously tried: NA  -Recommended to continue current medication  Osteoarthritis (Goal: Improve symptom control) -Controlled -Current treatment  Acetaminophen CR 650 mg twice daily  Osteo-Bi Flex  -Medications previously tried: NA -Has been taking osteo Bi-flex supplement for ~ 1 year with no noticeable improvement.  -Counseled on appropriate use of acetminophen for osteoarthritis and counseled to avoid doses greater than 3000 mg daily   -Recommended STOPPING Osteo Bi-Flex (ineffective)  GERD (Goal: Prevent Heartburn/Reflux) -Not ideally controlled -Current treatment  Omeprazole 40 mg daily (HS)  -Medications previously tried: Ranitidine  -Still has some belching symptoms,  chest pain symptoms few times weekly  -Counseled on trigger identification and avoidance, not lying down for 30 minutes after eating  -Counseled to take omeprazole on empty stomach.  -Recommended to continue current medication  Chronic Kidney Disease Stage 3a  -All medications assessed for renal dosing and appropriateness in chronic kidney disease. -Rosuvastatin: Acceptable in eGFR >30. Could consider switching to atorvastatin if renal function declines  -Metformin dose appropriate given current kidney function, but will require frequent monitoring to ensure stable kidney function.  -Recommended to continue current medication   Patient Goals/Self-Care Activities Patient will:  - check glucose daily, document, and provide at future appointments check blood pressure weekly, document, and provide at future appointments  Follow Up Plan: Telephone follow up appointment with care management team member scheduled for:  08/24/2021 at 10:00 AM    Ms. Crandell was given information about Chronic Care Management services today including:  CCM service includes personalized support from designated clinical staff supervised by her physician, including individualized plan of care and coordination with other care providers 24/7 contact phone numbers for assistance for urgent and routine care needs. Standard insurance, coinsurance, copays and deductibles apply for chronic care management only during months in which we provide at least 20 minutes of these services. Most insurances cover these services at 100%, however patients may be responsible for any copay, coinsurance and/or deductible  if applicable. This service may help you avoid the need for more expensive face-to-face services. Only one practitioner may furnish and bill the service in a calendar month. The patient may stop CCM services at any time (effective at the end of the month) by phone call to the office staff.  Patient agreed to services and  verbal consent obtained.   Patient verbalizes understanding of instructions provided today and agrees to view in [place].   Doristine Section, [facility] [phone]

## 2021-03-16 DIAGNOSIS — M170 Bilateral primary osteoarthritis of knee: Secondary | ICD-10-CM | POA: Diagnosis not present

## 2021-03-16 DIAGNOSIS — M25562 Pain in left knee: Secondary | ICD-10-CM | POA: Diagnosis not present

## 2021-03-16 DIAGNOSIS — M25761 Osteophyte, right knee: Secondary | ICD-10-CM | POA: Diagnosis not present

## 2021-03-16 DIAGNOSIS — M5136 Other intervertebral disc degeneration, lumbar region: Secondary | ICD-10-CM | POA: Diagnosis not present

## 2021-03-16 DIAGNOSIS — M25762 Osteophyte, left knee: Secondary | ICD-10-CM | POA: Diagnosis not present

## 2021-03-16 DIAGNOSIS — G8929 Other chronic pain: Secondary | ICD-10-CM | POA: Diagnosis not present

## 2021-03-16 DIAGNOSIS — M48062 Spinal stenosis, lumbar region with neurogenic claudication: Secondary | ICD-10-CM | POA: Diagnosis not present

## 2021-03-16 DIAGNOSIS — M5416 Radiculopathy, lumbar region: Secondary | ICD-10-CM | POA: Diagnosis not present

## 2021-03-16 DIAGNOSIS — M25561 Pain in right knee: Secondary | ICD-10-CM | POA: Diagnosis not present

## 2021-03-23 ENCOUNTER — Encounter: Payer: Self-pay | Admitting: Family Medicine

## 2021-03-23 ENCOUNTER — Telehealth (INDEPENDENT_AMBULATORY_CARE_PROVIDER_SITE_OTHER): Payer: Medicare PPO | Admitting: Family Medicine

## 2021-03-23 VITALS — BP 123/79 | HR 72 | Temp 97.7°F | Ht 62.0 in | Wt 242.0 lb

## 2021-03-23 DIAGNOSIS — J449 Chronic obstructive pulmonary disease, unspecified: Secondary | ICD-10-CM | POA: Diagnosis not present

## 2021-03-23 DIAGNOSIS — R5383 Other fatigue: Secondary | ICD-10-CM

## 2021-03-23 DIAGNOSIS — R5381 Other malaise: Secondary | ICD-10-CM

## 2021-03-23 DIAGNOSIS — R0600 Dyspnea, unspecified: Secondary | ICD-10-CM | POA: Diagnosis not present

## 2021-03-23 DIAGNOSIS — J069 Acute upper respiratory infection, unspecified: Secondary | ICD-10-CM | POA: Diagnosis not present

## 2021-03-23 MED ORDER — GUAIFENESIN ER 600 MG PO TB12: 600.0000 mg | ORAL_TABLET | Freq: Two times a day (BID) | ORAL | 0 refills | Status: AC

## 2021-03-23 MED ORDER — PREDNISONE 20 MG PO TABS: 40.0000 mg | ORAL_TABLET | Freq: Every day | ORAL | 0 refills | Status: AC

## 2021-03-23 MED ORDER — LEVOCETIRIZINE DIHYDROCHLORIDE 5 MG PO TABS: 5.0000 mg | ORAL_TABLET | Freq: Every evening | ORAL | 1 refills | Status: DC

## 2021-03-23 MED ORDER — ALBUTEROL SULFATE HFA 108 (90 BASE) MCG/ACT IN AERS
2.0000 | INHALATION_SPRAY | Freq: Four times a day (QID) | RESPIRATORY_TRACT | 0 refills | Status: DC | PRN
Start: 2021-03-23 — End: 2021-08-30

## 2021-03-23 NOTE — Progress Notes (Signed)
Name: Summer [name]   MRN: [id number]    DOB: 1951/04/25   Date:03/23/2021       Progress Note  Subjective:   Chief Complaint  Chief Complaint  Patient presents with   Cough    Sore throat and no appetite, onset 2 days   I connected with  [name] on 03/23/21 at  1:20 PM EDT by telephone and verified that I am speaking with the correct person using two identifiers.   I discussed the limitations, risks, security and privacy concerns of performing an evaluation and management service by telephone and the availability of in person appointments. Staff also discussed with the patient that there may be a patient responsible charge related to this service.  Patient verbalized understanding and agreed to proceed with encounter. Patient Location: home Provider Location: [facility] Additional Individuals present: none  HPI  Gradual onset over the past 2-3 days of nasal congestion, dry cough, sore throat, malaise,  Feels tight in chest and congested and wheezing   Hx of COPD CHF   Patient Active Problem List   Diagnosis Date Noted   Chronic obstructive pulmonary disease ([place]) 01/28/2021   Atherosclerosis of native arteries of the extremities with ulceration ([place]) 12/03/2020   Diabetes mellitus ([place]) 05/24/2020   B12 deficiency 05/24/2020   Vitamin D deficiency 05/24/2020   Metatarsalgia of left foot 01/15/2020   Major depressive disorder, recurrent episode, moderate ([place]) 04/04/2019   Left arm weakness 04/03/2019   (HFpEF) heart failure with preserved ejection fraction ([place]) 08/31/2017   Moderate persistent asthma 08/31/2017   Hyperlipidemia associated with type 2 diabetes mellitus ([place]) 06/14/2017   Dyslipidemia associated with type 2 diabetes mellitus ([place]) 08/18/2016   Charcot foot due to diabetes mellitus ([place]) 02/22/2016   Acquired abduction deformity of foot 07/12/2015   Osteoarthritis of subtalar joint 07/12/2015   Poorly controlled type 2 diabetes mellitus with neuropathy  ([place]) 07/12/2015   Arthritis of foot, degenerative 07/12/2015   Carpal tunnel syndrome 04/17/2015   Chronic constipation 04/17/2015   Insomnia, persistent 04/17/2015   Stage 3 chronic kidney disease ([place]) 04/17/2015   Decreased exercise tolerance 04/17/2015   Diabetes mellitus with polyneuropathy ([place]) 04/17/2015   Gastro-esophageal reflux disease without esophagitis 04/17/2015   Bursitis, trochanteric 04/17/2015   Cephalalgia 04/17/2015   Hypertension associated with diabetes ([place]) 04/17/2015   Adult hypothyroidism 04/17/2015   Hearing loss 04/17/2015   Chronic recurrent major depressive disorder ([place]) 04/17/2015   Neurogenic claudication ([place]) 04/17/2015   Class 3 severe obesity with serious comorbidity and body mass index (BMI) of 45.0 to 49.9 in adult ([place]) 04/17/2015   Hypo-ovarianism 04/17/2015   Perennial allergic rhinitis with seasonal variation 04/17/2015   Acne erythematosa 04/17/2015   Dyskinesia, tardive 04/17/2015   Memory loss 04/17/2015   Impingement syndrome of shoulder 04/17/2015   Dermatitis, stasis 04/17/2015   Obstructive sleep apnea 05/14/2014   Shortness of breath on exertion 05/06/2014   Mixed hyperlipidemia 02/06/2012   LBP (low back pain) 09/16/2008    Social History   Tobacco Use   Smoking status: Never   Smokeless tobacco: Never  Substance Use Topics   Alcohol use: No    Alcohol/week: 0.0 standard drinks     Current Outpatient Medications:    ACCU-CHEK SOFTCLIX LANCETS lancets, , Disp: , Rfl:    acetaminophen (TYLENOL 8 HOUR) 650 MG CR tablet, Take 1 tablet (650 mg total) by mouth every 8 (eight) hours as needed for pain., Disp: 90 tablet, Rfl: 2  Alcohol Swabs (B-D SINGLE USE SWABS REGULAR) PADS, , Disp: , Rfl:    amLODipine (NORVASC) 5 MG tablet, Take 5 mg by mouth daily., Disp: , Rfl:    ASPIRIN LOW DOSE 81 MG EC tablet, TAKE 1 TABLET (81 MG TOTAL) BY MOUTH DAILY., Disp: 30 tablet, Rfl: 0   Blood Glucose Monitoring Suppl (ACCU-CHEK AVIVA  PLUS) w/Device KIT, , Disp: , Rfl:    buPROPion (WELLBUTRIN XL) 150 MG 24 hr tablet, Take 1 tablet by mouth daily., Disp: 90 tablet, Rfl: 0   Cholecalciferol (VITAMIN D3) 25 MCG (1000 UT) CAPS, Take 1,000 Units by mouth., Disp: , Rfl:    Cyanocobalamin (VITAMIN B12 PO), Take 1 tablet by mouth daily. , Disp: , Rfl:    furosemide (LASIX) 20 MG tablet, Take 1 tablet by mouth daily., Disp: 90 tablet, Rfl: 0   gentamicin cream (GARAMYCIN) 0.1 %, Apply 1 application topically 2 (two) times daily., Disp: 30 g, Rfl: 1   icosapent Ethyl (VASCEPA) 1 g capsule, Take 2 capsules (2 g total) by mouth 2 (two) times daily., Disp: 360 capsule, Rfl: 1   insulin NPH-regular Human (70-30) 100 UNIT/ML injection, Inject 100 Units into the skin daily with supper., Disp: , Rfl:    irbesartan-hydrochlorothiazide (AVALIDE) 300-12.5 MG tablet, Take 1 tablet by mouth daily., Disp: 90 tablet, Rfl: 1   levothyroxine (SYNTHROID) 25 MCG tablet, Take 1 tablet by mouth daily except, take 1 and 1/2 tablets by mouth on Sunday., Disp: 100 tablet, Rfl: 0   metFORMIN (GLUCOPHAGE) 500 MG tablet, Take 1,000 mg by mouth daily with breakfast., Disp: , Rfl:    metoprolol tartrate (LOPRESSOR) 25 MG tablet, Take 1 tablet (25 mg total) by mouth 2 (two) times daily., Disp: 180 tablet, Rfl: 1   Misc Natural Products (OSTEO BI-FLEX ADV JOINT SHIELD PO), Take by mouth 2 (two) times daily. , Disp: , Rfl:    modafinil (PROVIGIL) 100 MG tablet, Take 1 tablet (100 mg total) by mouth daily., Disp: 30 tablet, Rfl: 3   montelukast (SINGULAIR) 10 MG tablet, Take 1 tablet by mouth at bedtime., Disp: 90 tablet, Rfl: 0   omeprazole (PRILOSEC) 40 MG capsule, Take 1 capsule by mouth daily., Disp: 90 capsule, Rfl: 0   Potassium 99 MG TABS, Take by mouth daily at 6 (six) AM., Disp: , Rfl:    pregabalin (LYRICA) 300 MG capsule, Take 300 mg by mouth 2 (two) times a day., Disp: , Rfl:    Probiotic Product (PROBIOTIC-10 PO), Take by mouth., Disp: , Rfl:    RELION  INSULIN SYRINGE 1ML/31G 31G X 5/16" 1 ML MISC, , Disp: , Rfl:    rosuvastatin (CRESTOR) 20 MG tablet, Take 1 tablet (20 mg total) by mouth daily., Disp: 90 tablet, Rfl: 1   sertraline (ZOLOFT) 100 MG tablet, Take 1.5 tablets (150 mg total) by mouth daily., Disp: 135 tablet, Rfl: 0   traZODone (DESYREL) 50 MG tablet, Take 1 tablet (50 mg total) by mouth at bedtime as needed for sleep., Disp: 90 tablet, Rfl: 1   vitamin C (ASCORBIC ACID) 500 MG tablet, Take 500 mg by mouth 2 (two) times daily. , Disp: , Rfl:   Allergies  Allergen Reactions   Codeine Other (See Comments)    "TRIPPED OUT"  DIDN'T LIKE THE WAY IT FELT   Atorvastatin     muscle pain   Hydrocodone     itching   Tramadol    Latex Rash   Zolpidem Other (See Comments)  Sleep walk    Chart Review: I personally reviewed active problem list, medication list, allergies, family history, social history, health maintenance, notes from last encounter, lab results, imaging with the patient/caregiver today.   Review of Systems  Constitutional: Negative.   HENT: Negative.    Eyes: Negative.   Respiratory: Negative.    Cardiovascular: Negative.   Gastrointestinal: Negative.   Endocrine: Negative.   Genitourinary: Negative.   Musculoskeletal: Negative.   Skin: Negative.   Allergic/Immunologic: Negative.   Neurological: Negative.   Hematological: Negative.   Psychiatric/Behavioral: Negative.    All other systems reviewed and are negative.   Objective:    Virtual encounter, vitals limited, only able to obtain the following Today's Vitals   03/23/21 1326  BP: 123/79  Pulse: 72  Temp: 97.7 F (36.5 C)  SpO2: 96%  Weight: 242 lb (109.8 kg)  Height: _0  (1.575 m)   Body mass index is 44.26 kg/m. Nursing Note and Vital Signs reviewed.  Physical Exam Patient alert, answering questions appropriately, normal phonation Occasional cough, speaking in full and complete sentences no audible wheeze or stridor  PE limited  by telephone encounter  No results found for this or any previous visit (from the past 72 hour(s)).  Assessment and Plan:     ICD-10-CM   1. Upper respiratory tract infection, unspecified type  J06.9 levocetirizine (XYZAL) 5 MG tablet   congestion, postnasal drip, sore throat, dry cough, no fever, no severe sinus pain, no known sick contacts, hx of allergies    2. Malaise and fatigue  R53.81    R53.83     3. Dyspnea, unspecified type  R06.00 predniSONE (DELTASONE) 20 MG tablet    albuterol (VENTOLIN HFA) 108 (90 Base) MCG/ACT inhaler   tight in chest, dry cough, wheeze, no orthopnea, no LE edema no change in weight, denies CP palpitations    4. Chronic obstructive pulmonary disease, unspecified COPD type ([name])  J44.9 predniSONE (DELTASONE) 20 MG tablet    albuterol (VENTOLIN HFA) 108 (90 Base) MCG/ACT inhaler    guaiFENesin (MUCINEX) 600 MG 12 hr tablet     Pt has BP cuff and pulse ox at home, CMA asked to call her back and get VS  I ended up calling the patient back to get the vital signs patient had reassuring pulse ox blood pressure and heart rate  Pt urged to rest, push fluids, and seek UC f/up if not feeling better in the next 3-5 days, or UC if feeling worse, she has multiple comorbidities with any worsening or failure to improve would want in person assessment and eval.  -Red flags and when to present for emergency care or RTC including but not limited to new/worsening/un-resolving symptoms,  reviewed with patient at time of visit. Follow up and care instructions discussed and provided in AVS. - I discussed the assessment and treatment plan with the patient. The patient was provided an opportunity to ask questions and all were answered. The patient agreed with the plan and demonstrated an understanding of the instructions.  - The patient was advised to call back or seek an in-person evaluation if the symptoms worsen or if the condition fails to improve as anticipated.  I  provided 18+ minutes of non-face-to-face time during this encounter.  Delsa Grana, PA-C 03/23/21 1:36 PM

## 2021-03-26 ENCOUNTER — Other Ambulatory Visit: Payer: Self-pay | Admitting: Family Medicine

## 2021-03-26 DIAGNOSIS — J4520 Mild intermittent asthma, uncomplicated: Secondary | ICD-10-CM

## 2021-03-26 NOTE — Telephone Encounter (Signed)
Requested Prescriptions  Pending Prescriptions Disp Refills  . montelukast (SINGULAIR) 10 MG tablet [Pharmacy Med Name: Montelukast Sodium 10mg  Tablet] 90 tablet 0    Sig: Take 1 tablet by mouth at bedtime.     Pulmonology:  Leukotriene Inhibitors Passed - 03/26/2021  6:08 AM      Passed - Valid encounter within last 12 months    Recent Outpatient Visits          3 days ago Upper respiratory tract infection, unspecified type   [facility] Delsa Grana, PA-C   1 month ago Atherosclerosis of native arteries of the extremities with ulceration [facility])   [facility] Steele Sizer, MD   6 months ago Dyslipidemia associated with type 2 diabetes mellitus [facility])   [facility] Steele Sizer, MD   7 months ago Cough   [facility] Towanda Malkin, MD   9 months ago Need for influenza vaccination   [facility] Steele Sizer, MD      Future Appointments            In 2 months Ancil Boozer, Drue Stager, MD [facility], [place]   In 5 months  [facility], [facility]

## 2021-04-07 DIAGNOSIS — E785 Hyperlipidemia, unspecified: Secondary | ICD-10-CM | POA: Diagnosis not present

## 2021-04-07 DIAGNOSIS — E1169 Type 2 diabetes mellitus with other specified complication: Secondary | ICD-10-CM | POA: Diagnosis not present

## 2021-04-07 DIAGNOSIS — E1142 Type 2 diabetes mellitus with diabetic polyneuropathy: Secondary | ICD-10-CM | POA: Diagnosis not present

## 2021-04-07 DIAGNOSIS — E039 Hypothyroidism, unspecified: Secondary | ICD-10-CM | POA: Diagnosis not present

## 2021-04-07 DIAGNOSIS — I152 Hypertension secondary to endocrine disorders: Secondary | ICD-10-CM | POA: Diagnosis not present

## 2021-04-07 DIAGNOSIS — E1159 Type 2 diabetes mellitus with other circulatory complications: Secondary | ICD-10-CM | POA: Diagnosis not present

## 2021-04-07 DIAGNOSIS — Z794 Long term (current) use of insulin: Secondary | ICD-10-CM | POA: Diagnosis not present

## 2021-04-19 ENCOUNTER — Ambulatory Visit: Payer: Medicare PPO | Admitting: Podiatry

## 2021-04-19 ENCOUNTER — Other Ambulatory Visit: Payer: Self-pay

## 2021-04-19 DIAGNOSIS — L97512 Non-pressure chronic ulcer of other part of right foot with fat layer exposed: Secondary | ICD-10-CM | POA: Diagnosis not present

## 2021-04-19 DIAGNOSIS — E08621 Diabetes mellitus due to underlying condition with foot ulcer: Secondary | ICD-10-CM | POA: Diagnosis not present

## 2021-04-19 NOTE — Progress Notes (Signed)
   Subjective:  70 y.o. female with PMHx of diabetes mellitus presenting today for follow-up evaluation of an ulcer that has developed to the dorsal aspect of the right second toe.  Patient underwent arthroplasty surgery on 12/09/2020 of the DIPJ right second digit.  Over time an ulcer developed.  Patient states that she is doing well today and she has not noticed any drainage to the area.  Past Medical History:  Diagnosis Date   Anemia    Arthritis    Asthma    B12 deficiency    Back pain    Carpal tunnel syndrome    Chronic kidney disease    Constipation    Depression    Depressive disorder    Diabetes mellitus    Dyspnea    Fluid retention    Foot pain    GERD (gastroesophageal reflux disease)    Headache    History of hiatal hernia    Hyperlipidemia    Hypertension    IBS (irritable bowel syndrome)    Insomnia    Joint pain    Lumbago    Memory loss    Obesity    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)    Pneumonia    Rhinitis, allergic    Rosacea    Sleep apnea    SOB (shortness of breath)    Thyroid disease    Unspecified hearing loss    Unspecified hereditary and idiopathic peripheral neuropathy    Unspecified sleep apnea    Vitamin D deficiency       Objective/Physical Exam General: The patient is alert and oriented x3 in no acute distress.  Dermatology:  Wound #1 noted to the dorsal aspect right second toe has healed.  Complete reepithelialization has occurred.  Skin is warm, dry and supple bilateral lower extremities.  Hyperkeratotic dystrophic nails noted 1-5 bilateral with associated tenderness to palpation.  Nails are elongated and thickened and discolored.  Vascular: Palpable pedal pulses bilaterally. No edema or erythema noted. Capillary refill within normal limits.  Neurological: Epicritic and protective threshold diminished bilaterally.   Musculoskeletal Exam: H/o LT 5th toe amputation  Assessment: 1.  Ulcer right second toe secondary to diabetes  mellitus 2. diabetes mellitus w/ peripheral neuropathy    Plan of Care:  1. Patient was evaluated. 2.  Light debridement of the ulcer lesion was performed using a tissue nipper without incident or bleeding.  The hyperkeratotic tissue was excisionally removed and the underlying wound appears healthy and healed 3.  Continue wearing diabetic shoes 4.  Return to clinic in 3 months for routine foot care  Edrick Kins, DPM [facility]  Dr. Edrick Kins, DPM    [address], [place] [address]                Office [phone]  Fax [phone]

## 2021-04-25 ENCOUNTER — Other Ambulatory Visit: Payer: Self-pay | Admitting: Family Medicine

## 2021-04-25 DIAGNOSIS — K219 Gastro-esophageal reflux disease without esophagitis: Secondary | ICD-10-CM

## 2021-04-25 NOTE — Telephone Encounter (Signed)
  Notes to clinic:  Review for future refill  Patient has appt on 05/31/2021 Just filled on 03/19/2021 for 90 day    Requested Prescriptions  Pending Prescriptions Disp Refills   omeprazole (PRILOSEC) 40 MG capsule [Pharmacy Med Name: Omeprazole '40mg'$  DR Capsule] 90 capsule 0    Sig: Take 1 capsule by mouth daily.     Gastroenterology: Proton Pump Inhibitors Passed - 04/25/2021  6:17 AM      Passed - Valid encounter within last 12 months    Recent Outpatient Visits           1 month ago Upper respiratory tract infection, unspecified type   [facility] Delsa Grana, PA-C   2 months ago Atherosclerosis of native arteries of the extremities with ulceration [facility])   [facility] Steele Sizer, MD   7 months ago Dyslipidemia associated with type 2 diabetes mellitus [facility])   [facility] Steele Sizer, MD   8 months ago Cough   [facility] Towanda Malkin, MD   10 months ago Need for influenza vaccination   [facility] Steele Sizer, MD       Future Appointments             In 1 month Ancil Boozer, Drue Stager, MD [facility], [place]   In 4 months  [facility], [facility]

## 2021-05-25 ENCOUNTER — Other Ambulatory Visit: Payer: Self-pay | Admitting: Family Medicine

## 2021-05-25 DIAGNOSIS — N1832 Chronic kidney disease, stage 3b: Secondary | ICD-10-CM

## 2021-05-25 DIAGNOSIS — E039 Hypothyroidism, unspecified: Secondary | ICD-10-CM

## 2021-05-25 DIAGNOSIS — F331 Major depressive disorder, recurrent, moderate: Secondary | ICD-10-CM

## 2021-05-25 DIAGNOSIS — G4733 Obstructive sleep apnea (adult) (pediatric): Secondary | ICD-10-CM

## 2021-05-25 NOTE — Telephone Encounter (Signed)
Last seen 7.13.2022 upcoming 9.20.2022

## 2021-05-25 NOTE — Telephone Encounter (Signed)
Requested medications are due for refill today.  yes  Requested medications are on the active medications list.  yes  Last refill. 5/20/20222  Future visit scheduled.   yes  Notes to clinic.  Medication not assigned a protocol.

## 2021-05-31 ENCOUNTER — Ambulatory Visit (INDEPENDENT_AMBULATORY_CARE_PROVIDER_SITE_OTHER): Payer: Medicare PPO | Admitting: Family Medicine

## 2021-05-31 ENCOUNTER — Other Ambulatory Visit: Payer: Self-pay

## 2021-05-31 ENCOUNTER — Encounter: Payer: Self-pay | Admitting: Family Medicine

## 2021-05-31 VITALS — BP 126/72 | HR 85 | Temp 98.2°F | Resp 20 | Ht 62.0 in | Wt 241.4 lb

## 2021-05-31 DIAGNOSIS — E785 Hyperlipidemia, unspecified: Secondary | ICD-10-CM

## 2021-05-31 DIAGNOSIS — F5105 Insomnia due to other mental disorder: Secondary | ICD-10-CM

## 2021-05-31 DIAGNOSIS — K219 Gastro-esophageal reflux disease without esophagitis: Secondary | ICD-10-CM

## 2021-05-31 DIAGNOSIS — E1169 Type 2 diabetes mellitus with other specified complication: Secondary | ICD-10-CM

## 2021-05-31 DIAGNOSIS — Z794 Long term (current) use of insulin: Secondary | ICD-10-CM

## 2021-05-31 DIAGNOSIS — E1142 Type 2 diabetes mellitus with diabetic polyneuropathy: Secondary | ICD-10-CM

## 2021-05-31 DIAGNOSIS — G4733 Obstructive sleep apnea (adult) (pediatric): Secondary | ICD-10-CM | POA: Diagnosis not present

## 2021-05-31 DIAGNOSIS — F331 Major depressive disorder, recurrent, moderate: Secondary | ICD-10-CM | POA: Diagnosis not present

## 2021-05-31 DIAGNOSIS — F99 Mental disorder, not otherwise specified: Secondary | ICD-10-CM

## 2021-05-31 DIAGNOSIS — I700 Atherosclerosis of aorta: Secondary | ICD-10-CM

## 2021-05-31 DIAGNOSIS — Z23 Encounter for immunization: Secondary | ICD-10-CM | POA: Diagnosis not present

## 2021-05-31 DIAGNOSIS — N1832 Chronic kidney disease, stage 3b: Secondary | ICD-10-CM

## 2021-05-31 DIAGNOSIS — J4520 Mild intermittent asthma, uncomplicated: Secondary | ICD-10-CM

## 2021-05-31 LAB — POCT GLYCOSYLATED HEMOGLOBIN (HGB A1C): Hemoglobin A1C: 7.8 % — AB (ref 4.0–5.6)

## 2021-05-31 MED ORDER — SERTRALINE HCL 100 MG PO TABS: 150.0000 mg | ORAL_TABLET | Freq: Every day | ORAL | 1 refills | Status: DC

## 2021-05-31 MED ORDER — MONTELUKAST SODIUM 10 MG PO TABS: 10.0000 mg | ORAL_TABLET | Freq: Every day | ORAL | 1 refills | Status: DC

## 2021-05-31 MED ORDER — BUPROPION HCL ER (XL) 150 MG PO TB24: 150.0000 mg | ORAL_TABLET | Freq: Every day | ORAL | 1 refills | Status: DC

## 2021-05-31 MED ORDER — OMEPRAZOLE 40 MG PO CPDR: 40.0000 mg | DELAYED_RELEASE_CAPSULE | Freq: Every day | ORAL | 1 refills | Status: DC

## 2021-05-31 MED ORDER — ROSUVASTATIN CALCIUM 40 MG PO TABS: 40.0000 mg | ORAL_TABLET | Freq: Every day | ORAL | 1 refills | Status: DC

## 2021-05-31 MED ORDER — TRAZODONE HCL 100 MG PO TABS: 100.0000 mg | ORAL_TABLET | Freq: Every evening | ORAL | 1 refills | Status: DC | PRN

## 2021-05-31 MED ORDER — MODAFINIL 100 MG PO TABS: 100.0000 mg | ORAL_TABLET | Freq: Every day | ORAL | 0 refills | Status: DC

## 2021-05-31 MED ORDER — EZETIMIBE 10 MG PO TABS: 10.0000 mg | ORAL_TABLET | Freq: Every day | ORAL | 1 refills | Status: DC

## 2021-05-31 NOTE — Progress Notes (Signed)
Name: Summer [name]   MRN: [id number]    DOB: Jan 27, 1951   Date:05/31/2021       Progress Note  Subjective  Chief Complaint  Follow Up  I connected with  Summer [name] on 05/31/21 at  2:00 PM EDT by telephone and verified that I am speaking with the correct person using two identifiers.  I discussed the limitations, risks, security and privacy concerns of performing an evaluation and management service by telephone and the availability of in person appointments. Staff also discussed with the patient that there may be a patient responsible charge related to this service. Patient agreed on having a virtual visit  Patient Location: in her car  Provider Location: at home  Additional Individuals present: alone   HPI  Hypothyroidism: she states hair loss is stable, no dysphagia, no change in bowel movements. On levothyroxine 25 mg daily and one and two on Sundays , last level was at goal, we will recheck next visit   Morbid Obesity: she is doing better , trying to lose weight by avoiding sweets    MDD: she is currently taking Zoloft 150 mg and Wellbutrin, she is still feeling down, she refuses to go back to psychiatrist.She wants to continue current regiment   Right lumbar radiculitis: . She is already taking gabapentin for neuropathy, She has a history of DDD lumbar spine, seen by Dr. Lacinda Axon in the past and had procedures done, She had some steroid injections done by Dr. Garen Grams at [facility]  and pain on back has improved.    Atherosclerosis of aorta  found on CT abdomen, she also had peripheral vascular craterization 04/25 and she has infrarenal obstruction. She is on Crestor 20 mg , Vascepa . She cannot afford Repatha . We will add Zetia and adjust dose of Crestor to 40 mg    Diabetes : she is under the care of Endocrinologist, Dr. Honor Junes , she came to our office today and A1C was 8.4 % and today in our office is down to  7.8 %, she states she is a stress eater, she is trying to eat  better . She has dyslipidemia, obesity, CKI, and HTN associated with DM.She is compliant with medication , eye exam is up to date, she had 5th toe amputated due to neuropathy and had a recent DIP J arthroplasty of right second toe back in 11/2020 and it took a long time but finally healed   CKI stage III: GFR is stable, she is on ARB, bp is controlled, seeing nephrologist - Dr. Holley Raring. She has good urine output and no pruritis  Last GFR was 38 in June 2022    Dyslipidemia: had labs done 05/13/2020 HDL is slightly better, LDL still above goal at 117 , triglycerides was high , she stopped taking fish oil due to cost, she cannot afford Repatha  OSA: she continues to wake up feeling tired, we started Mondafinil this year and it has helped with energy , she is still not wearing CPAP machine, unable to tolerate it . She asked for higher dose of Trazodone to help her stay asleep we will increase from 50 to 100 mg    HTN: bp at home has been around 120's-130's no chest pain or palpitation, she has SOB with activity but stable. Seeing Dr. Saunders Revel for SOB, likely multifactorial. Continue medications    Patient Active Problem List   Diagnosis Date Noted   Chronic obstructive pulmonary disease ([place]) 01/28/2021   Atherosclerosis of  native arteries of the extremities with ulceration ([place]) 12/03/2020   Diabetes mellitus ([place]) 05/24/2020   B12 deficiency 05/24/2020   Vitamin D deficiency 05/24/2020   Metatarsalgia of left foot 01/15/2020   Major depressive disorder, recurrent episode, moderate ([place]) 04/04/2019   Left arm weakness 04/03/2019   (HFpEF) heart failure with preserved ejection fraction ([place]) 08/31/2017   Moderate persistent asthma 08/31/2017   Hyperlipidemia associated with type 2 diabetes mellitus ([place]) 06/14/2017   Dyslipidemia associated with type 2 diabetes mellitus ([place]) 08/18/2016   Charcot foot due to diabetes mellitus ([place]) 02/22/2016   Acquired abduction deformity of foot 07/12/2015    Osteoarthritis of subtalar joint 07/12/2015   Poorly controlled type 2 diabetes mellitus with neuropathy ([place]) 07/12/2015   Arthritis of foot, degenerative 07/12/2015   Carpal tunnel syndrome 04/17/2015   Chronic constipation 04/17/2015   Insomnia, persistent 04/17/2015   Stage 3 chronic kidney disease ([place]) 04/17/2015   Decreased exercise tolerance 04/17/2015   Diabetes mellitus with polyneuropathy ([place]) 04/17/2015   Gastro-esophageal reflux disease without esophagitis 04/17/2015   Bursitis, trochanteric 04/17/2015   Cephalalgia 04/17/2015   Hypertension associated with diabetes ([place]) 04/17/2015   Adult hypothyroidism 04/17/2015   Hearing loss 04/17/2015   Chronic recurrent major depressive disorder ([place]) 04/17/2015   Neurogenic claudication ([place]) 04/17/2015   Class 3 severe obesity with serious comorbidity and body mass index (BMI) of 45.0 to 49.9 in adult ([place]) 04/17/2015   Hypo-ovarianism 04/17/2015   Perennial allergic rhinitis with seasonal variation 04/17/2015   Acne erythematosa 04/17/2015   Dyskinesia, tardive 04/17/2015   Memory loss 04/17/2015   Impingement syndrome of shoulder 04/17/2015   Dermatitis, stasis 04/17/2015   Obstructive sleep apnea 05/14/2014   Shortness of breath on exertion 05/06/2014   Mixed hyperlipidemia 02/06/2012   LBP (low back pain) 09/16/2008    Past Surgical History:  Procedure Laterality Date   ABDOMINAL HYSTERECTOMY  1975   ANKLE SURGERY Left approx Jan 2018   APPENDECTOMY  1970   CATARACT EXTRACTION  01/2011   right   COLONOSCOPY WITH PROPOFOL N/A 08/19/2019   Procedure: COLONOSCOPY WITH PROPOFOL;  Surgeon: Jonathon Bellows, MD;  Location: [facility] END[facility]COPY;  Service: Gastroenterology;  Laterality: N/A;   eye lid surgery  2013   bilateral   FOOT SURGERY     LOWER EXTREMITY ANGIOGRAPHY Left 11/23/2020   Procedure: LOWER EXTREMITY ANGIOGRAPHY;  Surgeon: Katha Cabal, MD;  Location: [facility];  Service: Cardiovascular;  Laterality:  Left;   NECK SURGERY     SPINE SURGERY     TUBAL LIGATION     VAGINAL HYSTERECTOMY  1989    Family History  Problem Relation Age of Onset   Aneurysm Mother    Aortic aneurysm Mother    Hypertension Mother    Hyperlipidemia Mother    Heart disease Mother    Obesity Mother    Heart attack Maternal Grandfather    Diabetes Maternal Grandfather     Social History   Socioeconomic History   Marital status: Significant Other    Spouse name: [name] Rump   Number of children: 1   Years of education: Not on file   Highest education level: High school graduate  Occupational History   Occupation: retired  Tobacco Use   Smoking status: Never   Smokeless tobacco: Never  Vaping Use   Vaping Use: Never used  Substance and Sexual Activity   Alcohol use: No    Alcohol/week: 0.0 standard drinks   Drug use: No  Sexual activity: Yes    Partners: Male  Other Topics Concern   Not on file  Social History Narrative   Son lives in [place] (retired from [employer]) and her granddaughter (77yrs old) lives in [place] with girlfriend   Social Determinants of Health   Financial Resource Strain: Low Risk    Difficulty of Paying Living Expenses: Not hard at all  Food Insecurity: No Food Insecurity   Worried About Charity fundraiser in the Last Year: Never true   Arboriculturist in the Last Year: Never true  Transportation Needs: No Transportation Needs   Lack of Transportation (Medical): No   Lack of Transportation (Non-Medical): No  Physical Activity: Inactive   Days of Exercise per Week: 0 days   Minutes of Exercise per Session: 0 min  Stress: No Stress Concern Present   Feeling of Stress : Only a little  Social Connections: Moderately Isolated   Frequency of Communication with Friends and Family: More than three times a week   Frequency of Social Gatherings with Friends and Family: Once a week   Attends Religious Services: Never   Marine scientist or Organizations: No   Attends  Music therapist: Never   Marital Status: Living with partner  Intimate Partner Violence: Not At Risk   Fear of Current or Ex-Partner: No   Emotionally Abused: No   Physically Abused: No   Sexually Abused: No     Current Outpatient Medications:    ACCU-CHEK SOFTCLIX LANCETS lancets, , Disp: , Rfl:    acetaminophen (TYLENOL 8 HOUR) 650 MG CR tablet, Take 1 tablet (650 mg total) by mouth every 8 (eight) hours as needed for pain., Disp: 90 tablet, Rfl: 2   albuterol (VENTOLIN HFA) 108 (90 Base) MCG/ACT inhaler, Inhale 2 puffs into the lungs every 6 (six) hours as needed for wheezing or shortness of breath., Disp: 8 g, Rfl: 0   Alcohol Swabs (B-D SINGLE USE SWABS REGULAR) PADS, , Disp: , Rfl:    amLODipine (NORVASC) 5 MG tablet, Take 5 mg by mouth daily., Disp: , Rfl:    ASPIRIN LOW DOSE 81 MG EC tablet, TAKE 1 TABLET (81 MG TOTAL) BY MOUTH DAILY., Disp: 30 tablet, Rfl: 0   Blood Glucose Monitoring Suppl (ACCU-CHEK AVIVA PLUS) w/Device KIT, , Disp: , Rfl:    buPROPion (WELLBUTRIN XL) 150 MG 24 hr tablet, Take 1 tablet by mouth daily., Disp: 90 tablet, Rfl: 0   Cholecalciferol (VITAMIN D3) 25 MCG (1000 UT) CAPS, Take 1,000 Units by mouth., Disp: , Rfl:    Cyanocobalamin (VITAMIN B12 PO), Take 1 tablet by mouth daily. , Disp: , Rfl:    furosemide (LASIX) 20 MG tablet, Take 1 tablet by mouth daily., Disp: 90 tablet, Rfl: 0   gentamicin cream (GARAMYCIN) 0.1 %, Apply 1 application topically 2 (two) times daily., Disp: 30 g, Rfl: 1   icosapent Ethyl (VASCEPA) 1 g capsule, Take 2 capsules (2 g total) by mouth 2 (two) times daily., Disp: 360 capsule, Rfl: 1   insulin NPH-regular Human (70-30) 100 UNIT/ML injection, Inject 100 Units into the skin daily with supper., Disp: , Rfl:    irbesartan-hydrochlorothiazide (AVALIDE) 300-12.5 MG tablet, Take 1 tablet by mouth daily., Disp: 90 tablet, Rfl: 1   levocetirizine (XYZAL) 5 MG tablet, Take 1 tablet (5 mg total) by mouth every evening., Disp:  90 tablet, Rfl: 1   levothyroxine (SYNTHROID) 25 MCG tablet, Take 1 tablet by mouth daily except,  take 1 and 1/2 tablets by mouth on Sunday., Disp: 100 tablet, Rfl: 0   metFORMIN (GLUCOPHAGE) 500 MG tablet, Take 1,000 mg by mouth daily with breakfast., Disp: , Rfl:    metoprolol tartrate (LOPRESSOR) 25 MG tablet, Take 1 tablet (25 mg total) by mouth 2 (two) times daily., Disp: 180 tablet, Rfl: 1   Misc Natural Products (OSTEO BI-FLEX ADV JOINT SHIELD PO), Take by mouth 2 (two) times daily. , Disp: , Rfl:    modafinil (PROVIGIL) 100 MG tablet, Take 1 tablet by mouth daily., Disp: 30 tablet, Rfl: 0   montelukast (SINGULAIR) 10 MG tablet, Take 1 tablet by mouth at bedtime., Disp: 90 tablet, Rfl: 0   omeprazole (PRILOSEC) 40 MG capsule, Take 1 capsule by mouth daily., Disp: 90 capsule, Rfl: 0   Potassium 99 MG TABS, Take by mouth daily at 6 (six) AM., Disp: , Rfl:    Potassium Gluconate 2.5 MEQ TABS, Take by mouth., Disp: , Rfl:    pregabalin (LYRICA) 300 MG capsule, Take 300 mg by mouth 2 (two) times a day., Disp: , Rfl:    Probiotic Product (PROBIOTIC-10 PO), Take by mouth., Disp: , Rfl:    RELION INSULIN SYRINGE 1ML/31G 31G X 5/16" 1 ML MISC, , Disp: , Rfl:    rosuvastatin (CRESTOR) 20 MG tablet, Take 1 tablet (20 mg total) by mouth daily., Disp: 90 tablet, Rfl: 1   sertraline (ZOLOFT) 100 MG tablet, Take 1.5 tablets (150 mg total) by mouth daily., Disp: 135 tablet, Rfl: 0   traZODone (DESYREL) 50 MG tablet, Take 1 tablet (50 mg total) by mouth at bedtime as needed for sleep., Disp: 90 tablet, Rfl: 1   vitamin C (ASCORBIC ACID) 500 MG tablet, Take 500 mg by mouth 2 (two) times daily. , Disp: , Rfl:   Allergies  Allergen Reactions   Codeine Other (See Comments)    "TRIPPED OUT"  DIDN'T LIKE THE WAY IT FELT   Atorvastatin     muscle pain   Hydrocodone     itching   Tramadol    Latex Rash   Zolpidem Other (See Comments)    Sleep walk    I personally reviewed active problem list,  medication list, allergies, family history with the patient/caregiver today.   ROS  Ten systems reviewed and is negative except as mentioned in HPI   Objective  Today's Vitals   05/31/21 0947 05/31/21 1346  BP:  126/72  Pulse:  85  Resp:  20  Temp:  98.2 F (36.8 C)  TempSrc:  Oral  SpO2:  96%  Weight: 242 lb (109.8 kg) 241 lb 6.4 oz (109.5 kg)  Height: _0  (1.575 m) _1  (1.575 m)  PainSc: 8  0-No pain  PainLoc: Knee    Body mass index is 44.15 kg/m.    Physical Exam  Awake, alert and oriented   Results for orders placed or performed in visit on 05/31/21 (from the past 72 hour(s))  POCT HgB A1C     Status: Abnormal   Collection Time: 05/31/21  1:49 PM  Result Value Ref Range   Hemoglobin A1C 7.8 (A) 4.0 - 5.6 %   HbA1c POC (<> result, manual entry)     HbA1c, POC (prediabetic range)     HbA1c, POC (controlled diabetic range)      PHQ2/9: Depression screen [facility] 2/9 05/31/2021 03/23/2021 01/28/2021 09/24/2020 08/26/2020  Decreased Interest 3 0 3 3 0  Down, Depressed, Hopeless 3 0 1 3 0  PHQ - 2 Score 6 0 4 6 0  Altered sleeping 2 0 2 2 -  Tired, decreased energy 0 0 3 3 -  Change in appetite 1 0 3 2 -  Feeling bad or failure about yourself  0 0 3 2 -  Trouble concentrating 0 0 0 0 -  Moving slowly or fidgety/restless 0 0 0 0 -  Suicidal thoughts 0 0 0 0 -  PHQ-9 Score 9 0 15 15 -  Difficult doing work/chores Somewhat difficult Not difficult at all - Somewhat difficult -  Some recent data might be hidden   PHQ-2/9 Result is positive.    Fall Risk: Fall Risk  05/31/2021 03/23/2021 01/28/2021 09/24/2020 08/26/2020  Falls in the past year? 0 0 0 0 -  Comment - - - - -  Number falls in past yr: - 0 0 0 0  Injury with Fall? - 0 0 0 0  Risk Factor Category  - - - - -  Comment - - - - -  Risk for fall due to : - - - - Impaired balance/gait  Risk for fall due to: Comment - - - - -  Follow up Falls prevention discussed - - - Falls prevention discussed      Assessment & Plan  1. Type 2 diabetes mellitus with diabetic polyneuropathy, with long-term current use of insulin ([name])  - POCT HgB A1C  2. Need for immunization against influenza  - Flu Vaccine QUAD High Dose(Fluad)  3. Dyslipidemia associated with type 2 diabetes mellitus ([name])  - rosuvastatin (CRESTOR) 40 MG tablet; Take 1 tablet (40 mg total) by mouth daily.  Dispense: 90 tablet; Refill: 1  4. Gastroesophageal reflux disease without esophagitis  - omeprazole (PRILOSEC) 40 MG capsule; Take 1 capsule (40 mg total) by mouth daily.  Dispense: 90 capsule; Refill: 1  5. Mild intermittent asthma in adult without complication  - montelukast (SINGULAIR) 10 MG tablet; Take 1 tablet (10 mg total) by mouth at bedtime.  Dispense: 90 tablet; Refill: 1  6. Obstructive sleep apnea  - modafinil (PROVIGIL) 100 MG tablet; Take 1 tablet (100 mg total) by mouth daily.  Dispense: 90 tablet; Refill: 0  7. MDD (major depressive disorder), recurrent episode, moderate ([name])  - modafinil (PROVIGIL) 100 MG tablet; Take 1 tablet (100 mg total) by mouth daily.  Dispense: 90 tablet; Refill: 0 - buPROPion (WELLBUTRIN XL) 150 MG 24 hr tablet; Take 1 tablet (150 mg total) by mouth daily.  Dispense: 90 tablet; Refill: 1 - sertraline (ZOLOFT) 100 MG tablet; Take 1.5 tablets (150 mg total) by mouth daily.  Dispense: 135 tablet; Refill: 1  8. Insomnia due to other mental disorder  - traZODone (DESYREL) 100 MG tablet; Take 1 tablet (100 mg total) by mouth at bedtime as needed for sleep.  Dispense: 90 tablet; Refill: 1  9. Stage 3b chronic kidney disease ([place])   10.Atherosclerosis of aorta ([name])  We will add zetia and increase dose of Crestor to 40 mg  11. Morbid obesity ([place])  Discussed with the patient the risk posed by an increased BMI. Discussed importance of portion control, calorie counting and at least 150 minutes of physical activity weekly. Avoid sweet beverages and drink more water. Eat  at least 6 servings of fruit and vegetables daily     I discussed the assessment and treatment plan with the patient. The patient was provided an opportunity to ask questions and all were answered. The patient agreed with the plan  and demonstrated an understanding of the instructions.   The patient was advised to call back or seek an in-person evaluation if the symptoms worsen or if the condition fails to improve as anticipated.  I provided 25 minutes of non-face-to-face time during this encounter.  Steele Sizer, MD

## 2021-06-30 DIAGNOSIS — E1169 Type 2 diabetes mellitus with other specified complication: Secondary | ICD-10-CM | POA: Diagnosis not present

## 2021-07-01 ENCOUNTER — Telehealth: Payer: Self-pay

## 2021-07-01 NOTE — Progress Notes (Signed)
Chronic Care Management Pharmacy Assistant   Name: Summer [name]  MRN: [id number] DOB: [birth year]  Reason for Encounter:Hypertension Disease State Call.   Recent office visits:  05/31/2021 Dr.Sowles MD (PCP)Increase Trazodone to 100 mg nightly, start Zetia 10 mg daily and increase Rosuvastatin 40 mg daily.   03/23/2021 Danelle Berry (PA-C) Start Prednisone 40 mg daily, Start Levocetirizine 5 mg daily, start guaifenesin 600 mg daily,Start Albuterol 108 MCG/ACT PRN  Recent consult visits:  04/19/2021 [name] DPM (Podiatry)No Medication Changes noted, Return to clinic in 3 months  04/07/2021 Verdis Frederickson MD (Endocrinology) Unable to see note 03/16/2021 Merri Ray DO (Physical Medicine) Unable to see note  Hospital visits:  None in previous 6 months  Medications: Outpatient Encounter Medications as of 07/01/2021  Medication Sig Note   ACCU-CHEK SOFTCLIX LANCETS lancets     acetaminophen (TYLENOL 8 HOUR) 650 MG CR tablet Take 1 tablet (650 mg total) by mouth every 8 (eight) hours as needed for pain.    albuterol (VENTOLIN HFA) 108 (90 Base) MCG/ACT inhaler Inhale 2 puffs into the lungs every 6 (six) hours as needed for wheezing or shortness of breath.    Alcohol Swabs (B-D SINGLE USE SWABS REGULAR) PADS     amLODipine (NORVASC) 5 MG tablet Take 5 mg by mouth daily.    ASPIRIN LOW DOSE 81 MG EC tablet TAKE 1 TABLET (81 MG TOTAL) BY MOUTH DAILY.    Blood Glucose Monitoring Suppl (ACCU-CHEK AVIVA PLUS) w/Device KIT     buPROPion (WELLBUTRIN XL) 150 MG 24 hr tablet Take 1 tablet (150 mg total) by mouth daily.    Cholecalciferol (VITAMIN D3) 25 MCG (1000 UT) CAPS Take 1,000 Units by mouth.    Cyanocobalamin (VITAMIN B12 PO) Take 1 tablet by mouth daily.     ezetimibe (ZETIA) 10 MG tablet Take 1 tablet (10 mg total) by mouth daily.    furosemide (LASIX) 20 MG tablet Take 1 tablet by mouth daily.    gentamicin cream (GARAMYCIN) 0.1 % Apply 1 application topically 2 (two) times  daily.    icosapent Ethyl (VASCEPA) 1 g capsule Take 2 capsules (2 g total) by mouth 2 (two) times daily.    insulin NPH-regular Human (70-30) 100 UNIT/ML injection Inject 100 Units into the skin daily with supper. 08/26/2020: Pt only taking once daily at night    irbesartan-hydrochlorothiazide (AVALIDE) 300-12.5 MG tablet Take 1 tablet by mouth daily.    levocetirizine (XYZAL) 5 MG tablet Take 1 tablet (5 mg total) by mouth every evening.    levothyroxine (SYNTHROID) 25 MCG tablet Take 1 tablet by mouth daily except, take 1 and 1/2 tablets by mouth on Sunday.    metFORMIN (GLUCOPHAGE) 500 MG tablet Take 1,000 mg by mouth daily with breakfast.    metoprolol tartrate (LOPRESSOR) 25 MG tablet Take 1 tablet (25 mg total) by mouth 2 (two) times daily.    Misc Natural Products (OSTEO BI-FLEX ADV JOINT SHIELD PO) Take by mouth 2 (two) times daily.     modafinil (PROVIGIL) 100 MG tablet Take 1 tablet (100 mg total) by mouth daily.    montelukast (SINGULAIR) 10 MG tablet Take 1 tablet (10 mg total) by mouth at bedtime.    omeprazole (PRILOSEC) 40 MG capsule Take 1 capsule (40 mg total) by mouth daily.    Potassium 99 MG TABS Take by mouth daily at 6 (six) AM.    Potassium Gluconate 2.5 MEQ TABS Take by mouth.    pregabalin (LYRICA) 300  MG capsule Take 300 mg by mouth 2 (two) times a day.    Probiotic Product (PROBIOTIC-10 PO) Take by mouth.    RELION INSULIN SYRINGE 1ML/31G 31G X 5/16" 1 ML MISC     rosuvastatin (CRESTOR) 40 MG tablet Take 1 tablet (40 mg total) by mouth daily.    sertraline (ZOLOFT) 100 MG tablet Take 1.5 tablets (150 mg total) by mouth daily.    traZODone (DESYREL) 100 MG tablet Take 1 tablet (100 mg total) by mouth at bedtime as needed for sleep.    vitamin C (ASCORBIC ACID) 500 MG tablet Take 500 mg by mouth 2 (two) times daily.     No facility-administered encounter medications on file as of 07/01/2021.    Care Gaps: Covid-19 Pfizer risk series Star Rating  Drugs: Irbesartan-HTCZ 300-12.5 mg last filled in 01/20/2021 for 30 days and 01/17/2021 for 30 day supply at [facility]. Metformin 500 mg last filled in 01/20/2021 for 30 days and 01/17/2021 for 30 day supply at [facility]. Rosuvastatin 20 mg last filled in 01/20/2021 for 30 days and 01/17/2021 for 30 day supply at [facility] No fill History for Rosuvastatin 40 mg . Medication Fill Gaps: None  Reviewed chart prior to disease state call. Spoke with patient regarding BP  Recent Office Vitals: BP Readings from Last 3 Encounters:  05/31/21 126/72  03/23/21 123/79  01/28/21 122/74   Pulse Readings from Last 3 Encounters:  05/31/21 85  03/23/21 72  01/28/21 90    Wt Readings from Last 3 Encounters:  05/31/21 241 lb 6.4 oz (109.5 kg)  03/23/21 242 lb (109.8 kg)  01/28/21 242 lb (109.8 kg)     Kidney Function Lab Results  Component Value Date/Time   CREATININE 1.33 (H) 01/28/2021 03:00 PM   CREATININE 1.21 (H) 11/23/2020 09:37 AM   CREATININE 1.50 (H) 09/24/2020 01:03 PM   CREATININE 1.42 (H) 10/14/2019 11:42 AM   GFRNONAA 40 (L) 01/28/2021 03:00 PM   GFRAA 46 (L) 01/28/2021 03:00 PM    BMP Latest Ref Rng & Units 01/28/2021 11/23/2020 09/24/2020  Glucose 65 - 99 mg/dL 126(H) - 106(H)  BUN 7 - 25 mg/dL 50(H) 45(H) 34(H)  Creatinine 0.60 - 0.93 mg/dL 1.33(H) 1.21(H) 1.50(H)  BUN/Creat Ratio 6 - 22 (calc) 38(H) - 23  Sodium 135 - 146 mmol/L 140 - 141  Potassium 3.5 - 5.3 mmol/L 4.7 - 4.6  Chloride 98 - 110 mmol/L 104 - 101  CO2 20 - 32 mmol/L 27 - 24  Calcium 8.6 - 10.4 mg/dL 10.0 - 10.0    Current antihypertensive regimen:  Amlodipine 5 mg daily  Furosemide 20 mg daily  Irbesartan-HCTZ 300-12.5 mg daily  Metoprolol tartrate 25 mg twice daily  What recent interventions/DTPs have been made by any provider to improve Blood Pressure control since last CPP Visit: None Any recent hospitalizations or ED visits since last visit with CPP? No  I have attempted  without success to contact this patient by phone three times to do her Hypertension Disease State call. I left a Voice message for patient to return my call.LVM 10/21,10/24,10/25   Adherence Review: Is the patient currently on ACE/ARB medication? Yes Does the patient have >5 day gap between last estimated fill dates? No   Anderson [place] Clinical Production designer, theatre/television/film [phone]

## 2021-07-04 DIAGNOSIS — E1122 Type 2 diabetes mellitus with diabetic chronic kidney disease: Secondary | ICD-10-CM | POA: Diagnosis not present

## 2021-07-04 DIAGNOSIS — R809 Proteinuria, unspecified: Secondary | ICD-10-CM | POA: Diagnosis not present

## 2021-07-04 DIAGNOSIS — N1832 Chronic kidney disease, stage 3b: Secondary | ICD-10-CM | POA: Diagnosis not present

## 2021-07-04 DIAGNOSIS — D631 Anemia in chronic kidney disease: Secondary | ICD-10-CM | POA: Diagnosis not present

## 2021-07-04 DIAGNOSIS — I10 Essential (primary) hypertension: Secondary | ICD-10-CM | POA: Diagnosis not present

## 2021-07-06 NOTE — Progress Notes (Addendum)
Reviewed chart prior to disease state call. Spoke with patient regarding BP  Recent Office Vitals: BP Readings from Last 3 Encounters:  05/31/21 126/72  03/23/21 123/79  01/28/21 122/74   Pulse Readings from Last 3 Encounters:  05/31/21 85  03/23/21 72  01/28/21 90    Wt Readings from Last 3 Encounters:  05/31/21 241 lb 6.4 oz (109.5 kg)  03/23/21 242 lb (109.8 kg)  01/28/21 242 lb (109.8 kg)     Kidney Function Lab Results  Component Value Date/Time   CREATININE 1.33 (H) 01/28/2021 03:00 PM   CREATININE 1.21 (H) 11/23/2020 09:37 AM   CREATININE 1.50 (H) 09/24/2020 01:03 PM   CREATININE 1.42 (H) 10/14/2019 11:42 AM   GFRNONAA 40 (L) 01/28/2021 03:00 PM   GFRAA 46 (L) 01/28/2021 03:00 PM    BMP Latest Ref Rng & Units 01/28/2021 11/23/2020 09/24/2020  Glucose 65 - 99 mg/dL 126(H) - 106(H)  BUN 7 - 25 mg/dL 50(H) 45(H) 34(H)  Creatinine 0.60 - 0.93 mg/dL 1.33(H) 1.21(H) 1.50(H)  BUN/Creat Ratio 6 - 22 (calc) 38(H) - 23  Sodium 135 - 146 mmol/L 140 - 141  Potassium 3.5 - 5.3 mmol/L 4.7 - 4.6  Chloride 98 - 110 mmol/L 104 - 101  CO2 20 - 32 mmol/L 27 - 24  Calcium 8.6 - 10.4 mg/dL 10.0 - 10.0    Current antihypertensive regimen:  Amlodipine 5 mg daily  Furosemide 20 mg daily  Irbesartan-HCTZ 300-12.5 mg daily  Metoprolol tartrate 25 mg twice daily   Patient denies headaches,lightheadedness, or dizziness. How often are you checking your Blood Pressure? 1-2x per week Current home BP readings:  Patient states her blood pressure has been ranging around 112/76.  What recent interventions/DTPs have been made by any provider to improve Blood Pressure control since last CPP Visit: None ID Any recent hospitalizations or ED visits since last visit with CPP? No What diet changes have been made to improve Blood Pressure Control?  Patient reports she is trying to eat healthier.  What exercise is being done to improve your Blood Pressure Control?  Patient states she is unable to  exercise at this time.  Patient reports she just started using her blood sugar meter, and she was inform her she should not be taking Vitamin C.Patient is asking is any of her current medication she is taking now has Vitamin C included if so should she stop them. Patient reports she know she is on a supplement.Notified Clinical pharmacist to advise.   Per Clinical Pharmacist,She should stop the vitamin c tablets. Nothing else on her list will affect her blood sugar results.  Patient Verbalized understanding.  Adherence Review: Is the patient currently on ACE/ARB medication? Yes Does the patient have >5 day gap between last estimated fill dates? No  Anderson [place] Clinical Production designer, theatre/television/film [phone]

## 2021-07-08 DIAGNOSIS — E119 Type 2 diabetes mellitus without complications: Secondary | ICD-10-CM | POA: Diagnosis not present

## 2021-07-08 DIAGNOSIS — H35342 Macular cyst, hole, or pseudohole, left eye: Secondary | ICD-10-CM | POA: Diagnosis not present

## 2021-07-08 LAB — HM DIABETES EYE EXAM

## 2021-07-14 DIAGNOSIS — E785 Hyperlipidemia, unspecified: Secondary | ICD-10-CM | POA: Diagnosis not present

## 2021-07-14 DIAGNOSIS — I152 Hypertension secondary to endocrine disorders: Secondary | ICD-10-CM | POA: Diagnosis not present

## 2021-07-14 DIAGNOSIS — E1169 Type 2 diabetes mellitus with other specified complication: Secondary | ICD-10-CM | POA: Diagnosis not present

## 2021-07-14 DIAGNOSIS — E039 Hypothyroidism, unspecified: Secondary | ICD-10-CM | POA: Diagnosis not present

## 2021-07-14 DIAGNOSIS — Z794 Long term (current) use of insulin: Secondary | ICD-10-CM | POA: Diagnosis not present

## 2021-07-14 DIAGNOSIS — E1142 Type 2 diabetes mellitus with diabetic polyneuropathy: Secondary | ICD-10-CM | POA: Diagnosis not present

## 2021-07-14 DIAGNOSIS — E1159 Type 2 diabetes mellitus with other circulatory complications: Secondary | ICD-10-CM | POA: Diagnosis not present

## 2021-07-19 ENCOUNTER — Other Ambulatory Visit: Payer: Self-pay | Admitting: Family Medicine

## 2021-07-19 ENCOUNTER — Ambulatory Visit: Payer: Medicare PPO | Admitting: Podiatry

## 2021-07-29 ENCOUNTER — Telehealth: Payer: Self-pay

## 2021-07-29 NOTE — Progress Notes (Signed)
Per Clinical pharmacist, please reschedule patient telephone on 08/25/2021 at 10:00 am.  Patient agreed and schedule a Telephone follow up appointment with Care management team member for : 08/03/2021 at 3:00 pm.   [place] Pharmacist Assistant [phone]

## 2021-07-30 IMAGING — MG DIGITAL SCREENING BILAT W/ TOMO W/ CAD
8 of 14 series · 8 of 40 positions shown · non-contrast
Comparison: Previous exam(s).

CLINICAL DATA: Screening.

EXAM:
DIGITAL SCREENING BILATERAL MAMMOGRAM WITH TOMO AND CAD

[R CC synth-2D (1 of 2)]
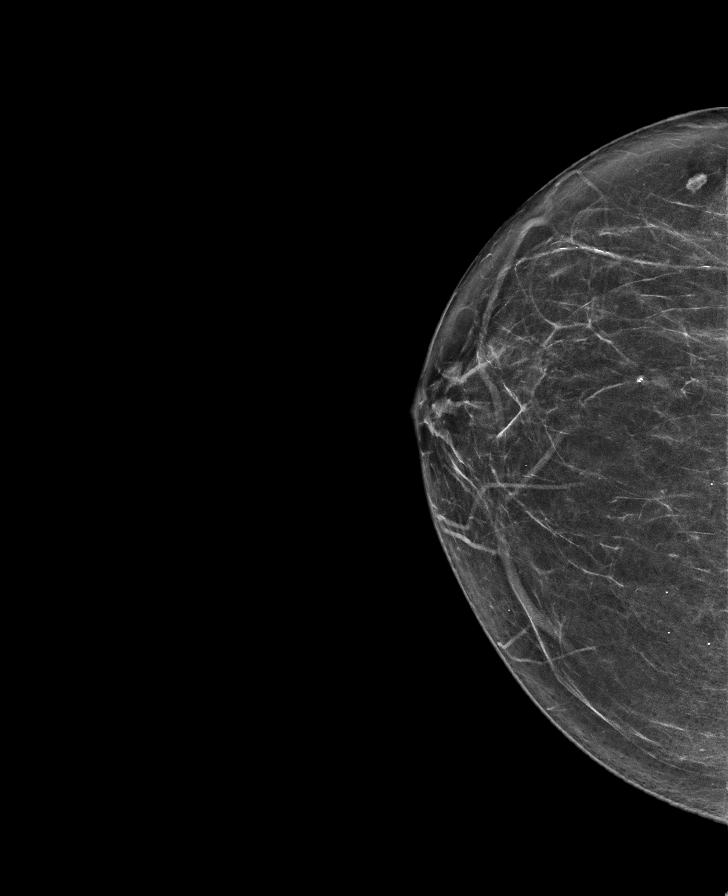

[R MLO synth-2D (1 of 2)]
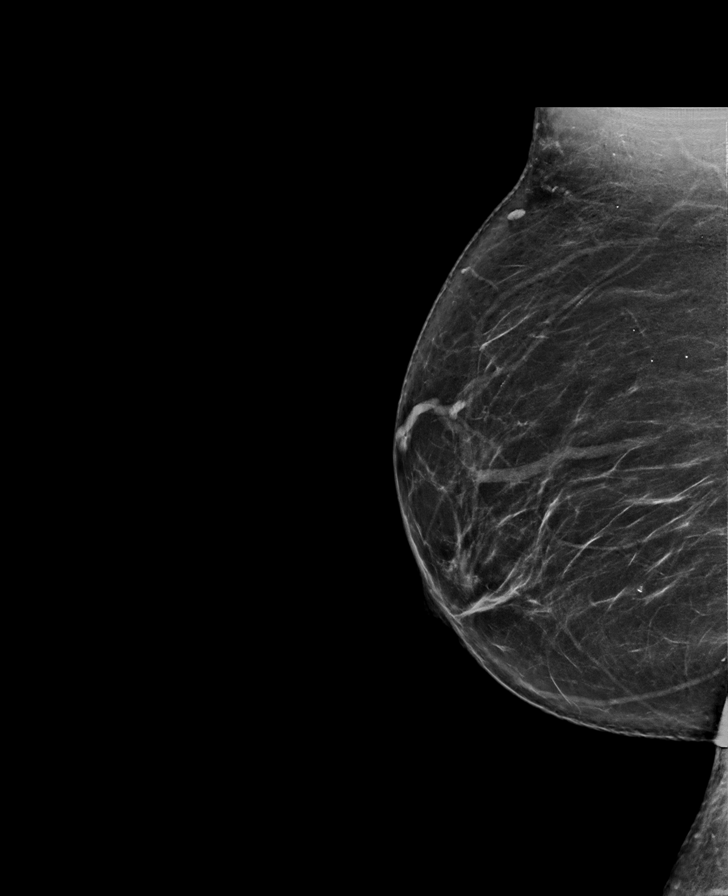

[L CC synth-2D (1 of 2)]
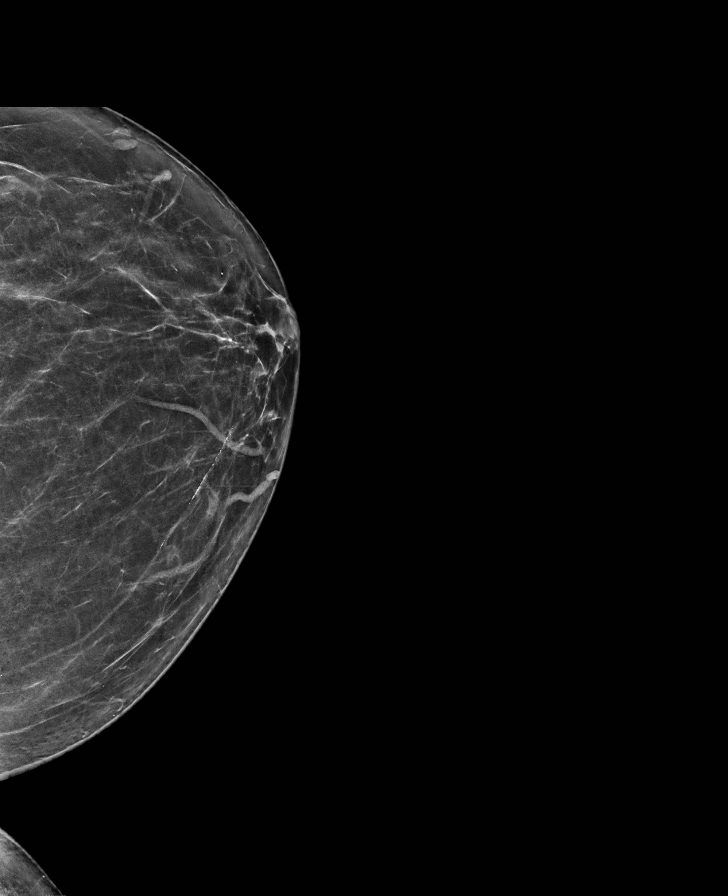

[L CC synth-2D (2 of 2)]
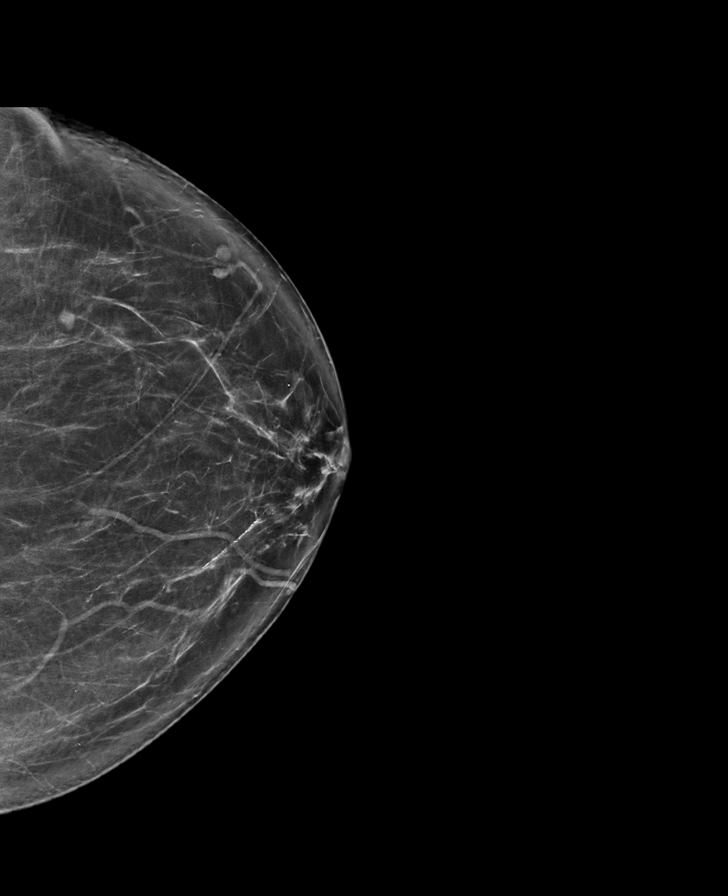

[L MLO synth-2D]
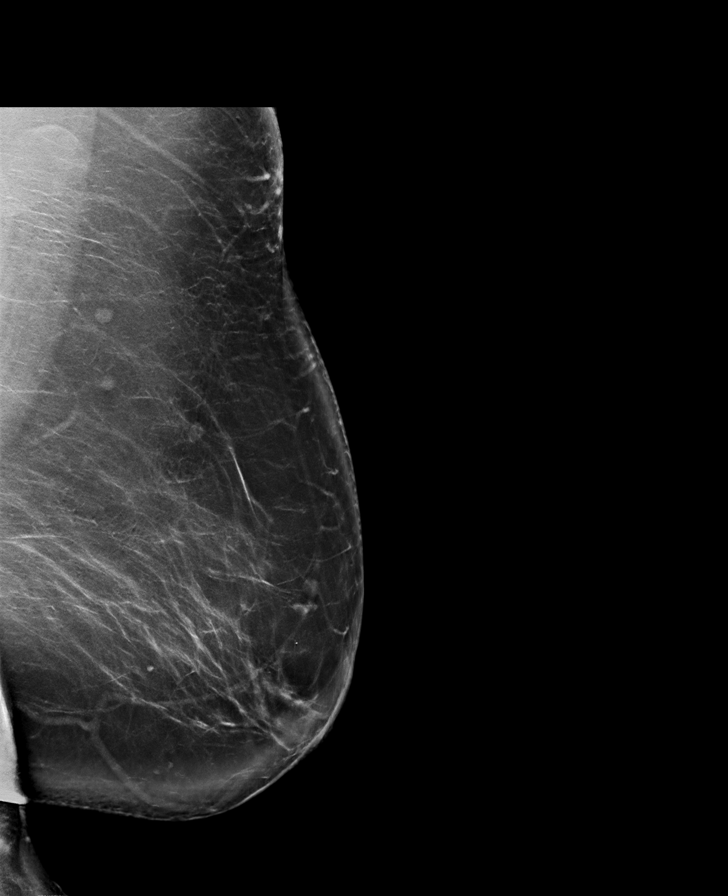

[R CC synth-2D (2 of 2)]
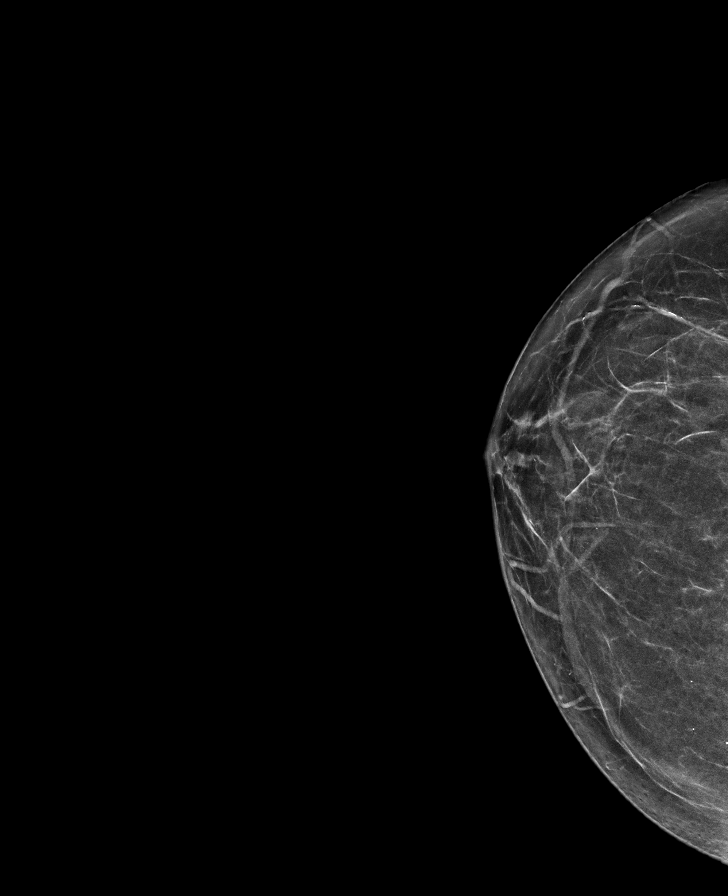

[R MLO synth-2D (2 of 2)]
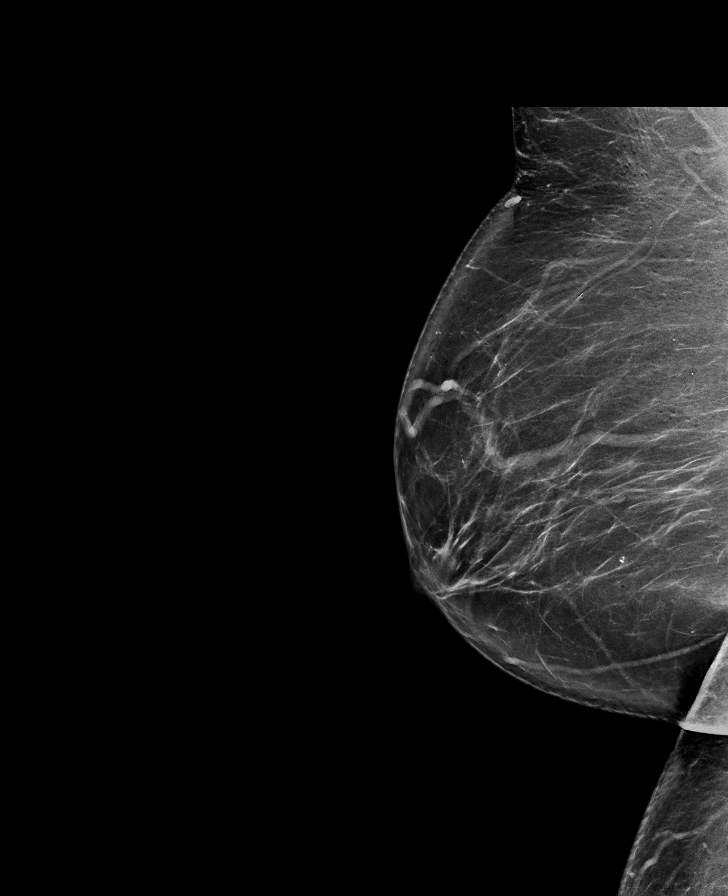

[R MLO tomo · tomo slice 43/84.0]
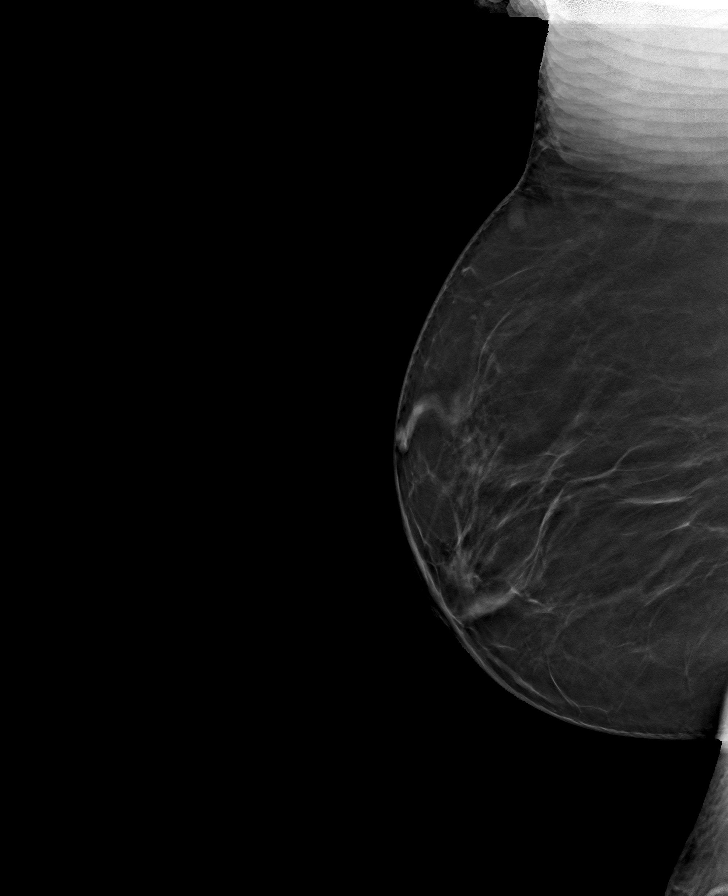

[8 of 40 positions shown; findings below may reference images not displayed]

ACR Breast Density Category b: There are scattered areas of
fibroglandular density.
FINDINGS: There are no findings suspicious for malignancy. Images were
processed with CAD.
IMPRESSION: No mammographic evidence of malignancy. A result letter of this
screening mammogram will be mailed directly to the patient.

RECOMMENDATION:
Screening mammogram in one year. (Code:[id number])

BI-RADS CATEGORY  1: Negative.

## 2021-08-01 DIAGNOSIS — E1169 Type 2 diabetes mellitus with other specified complication: Secondary | ICD-10-CM | POA: Diagnosis not present

## 2021-08-03 ENCOUNTER — Ambulatory Visit (INDEPENDENT_AMBULATORY_CARE_PROVIDER_SITE_OTHER): Payer: Medicare PPO

## 2021-08-03 DIAGNOSIS — E785 Hyperlipidemia, unspecified: Secondary | ICD-10-CM

## 2021-08-03 DIAGNOSIS — Z794 Long term (current) use of insulin: Secondary | ICD-10-CM

## 2021-08-03 NOTE — Progress Notes (Signed)
Chronic Care Management Pharmacy Note  08/08/2021 Name:  Summer [name] MRN:  [id number] DOB:  03/22/1951  Summary: Patient presents for CCM follow-up. She had some instances of hypoglycemia due to mistiming insulin administration.   Recommendations/Changes made from today's visit: -Recommended STOPPING furosemide (ineffective + increased risk of dehydration or AKI with HCTZ use)  -STOP Osteo Bi-Flex (ineffective)  Plan: CPP follow-up in 6 months  Subjective: Summer [name] is an 70 y.o. year old female who is a primary patient of Steele Sizer, MD.  The CCM team was consulted for assistance with disease management and care coordination needs.    Engaged with patient by telephone for follow up visit in response to provider referral for pharmacy case management and/or care coordination services.   Consent to Services:  The patient was given information about Chronic Care Management services, agreed to services, and gave verbal consent prior to initiation of services.  Please see initial visit note for detailed documentation.   Patient Care Team: Steele Sizer, MD as PCP - General (Family Medicine) End, Harrell Gave, MD as PCP - Cardiology (Cardiology) Gardiner Barefoot, DPM as Consulting Physician (Podiatry) Lonia Farber, MD as Consulting Physician (Endocrinology) Sharlet Salina, MD as Referring Physician (Physical Medicine and Rehabilitation) Germaine Pomfret, [facility] (Pharmacist)  Recent office visits: 05/31/21: Patient presented to Dr. Ancil Boozer for follow-up. A1c 7.8%. Rosuvastatin increased to 40 mg daily. Ezetimibe 10 mg daily.  01/28/21: Patient presented to Dr. Ancil Boozer for follow-up. Vascepa started. Albuterol, amlodipine, ciprofloxacin, diclofenac, doxycyline, duloxetine, metronidazole, miconazole, omega-3, tizanidine stopped.   Recent consult visits: 07/14/21: Patient presented to Dr. Honor Junes (Endrocrinology). Relion 70/30, 100 units with supper, 50 units with breakfast  or lunch 02/28/21: Patient presented to Dr. Holley Raring (Nephrology) for follow-up.  02/15/2021 Daylene Katayama  (Podiatry) - No medication [facility] visits: None in previous 6 months   Objective:  Lab Results  Component Value Date   CREATININE 1.33 (H) 01/28/2021   BUN 50 (H) 01/28/2021   GFRNONAA 40 (L) 01/28/2021   GFRAA 46 (L) 01/28/2021   NA 140 01/28/2021   K 4.7 01/28/2021   CALCIUM 10.0 01/28/2021   CO2 27 01/28/2021   GLUCOSE 126 (H) 01/28/2021    Lab Results  Component Value Date/Time   HGBA1C 7.8 (A) 05/31/2021 01:49 PM   HGBA1C 7.1 10/07/2020 12:00 AM   HGBA1C 6.7 (H) 05/13/2020 11:46 AM   HGBA1C 6.7 (A) 03/12/2020 11:37 AM   HGBA1C 7.2 (H) 04/02/2019 05:43 PM   HGBA1C 8.1 08/27/2018 02:56 PM   HGBA1C 8.1 (A) 08/27/2018 02:56 PM   HGBA1C 8.1 (A) 08/27/2018 02:56 PM   MICROALBUR 0.6 01/28/2018 03:52 PM   MICROALBUR 20 02/19/2017 10:40 AM   MICROALBUR 20 07/22/2015 02:05 PM    Last diabetic Eye exam:  Lab Results  Component Value Date/Time   HMDIABEYEEXA No Retinopathy 07/08/2021 12:00 AM    Last diabetic Foot exam:  Lab Results  Component Value Date/Time   HMDIABFOOTEX Dr. Honor Junes 12/19/2018 12:00 AM     Lab Results  Component Value Date   CHOL 209 (H) 05/13/2020   HDL 38 (L) 05/13/2020   LDLCALC 117 (H) 05/13/2020   TRIG 309 (H) 05/13/2020   CHOLHDL 5.5 (H) 05/13/2020    Hepatic Function Latest Ref Rng & Units 01/28/2021 09/24/2020 05/13/2020  Total Protein 6.1 - 8.1 g/dL 6.5 6.9 6.4  Albumin 3.7 - 4.7 g/dL - 4.4 4.5  AST 10 - 35 U/L $Remo'18 15 14  'DobfE$ ALT 6 -  29 U/L $Remo'21 14 14  'kfVFr$ Alk Phosphatase 44 - 121 IU/L - 100 85  Total Bilirubin 0.2 - 1.2 mg/dL 0.4 0.3 0.3    Lab Results  Component Value Date/Time   TSH 1.71 01/28/2021 03:00 PM   TSH 2.27 09/24/2020 03:02 PM    CBC Latest Ref Rng & Units 05/13/2020 10/14/2019 04/02/2019  WBC 3.4 - 10.8 x10E3/uL 8.0 8.6 9.2  Hemoglobin 11.1 - 15.9 g/dL 11.1 11.4(L) 11.8(L)  Hematocrit 34.0 - 46.6 % 33.7(L)  33.7(L) 35.3(L)  Platelets 150 - 450 x10E3/uL 153 238 254    Lab Results  Component Value Date/Time   VD25OH 41.1 05/13/2020 11:46 AM   VD25OH 37 05/01/2018 02:18 PM    Clinical ASCVD: Yes  The 10-year ASCVD risk score (Arnett DK, et al., 2019) is: 32.9%   Values used to calculate the score:     Age: 62 years     Sex: Female     Is Non-Hispanic African American: No     Diabetic: Yes     Tobacco smoker: No     Systolic Blood Pressure: 440 mmHg     Is BP treated: Yes     HDL Cholesterol: 38 mg/dL     Total Cholesterol: 209 mg/dL    Depression screen [facility] 2/9 05/31/2021 03/23/2021 01/28/2021  Decreased Interest 3 0 3  Down, Depressed, Hopeless 3 0 1  PHQ - 2 Score 6 0 4  Altered sleeping 2 0 2  Tired, decreased energy 0 0 3  Change in appetite 1 0 3  Feeling bad or failure about yourself  0 0 3  Trouble concentrating 0 0 0  Moving slowly or fidgety/restless 0 0 0  Suicidal thoughts 0 0 0  PHQ-9 Score 9 0 15  Difficult doing work/chores Somewhat difficult Not difficult at all -  Some recent data might be hidden    Social History   Tobacco Use  Smoking Status Never  Smokeless Tobacco Never   BP Readings from Last 3 Encounters:  05/31/21 126/72  03/23/21 123/79  01/28/21 122/74   Pulse Readings from Last 3 Encounters:  05/31/21 85  03/23/21 72  01/28/21 90   Wt Readings from Last 3 Encounters:  05/31/21 241 lb 6.4 oz (109.5 kg)  03/23/21 242 lb (109.8 kg)  01/28/21 242 lb (109.8 kg)   BMI Readings from Last 3 Encounters:  05/31/21 44.15 kg/m  03/23/21 44.26 kg/m  01/28/21 44.26 kg/m    Assessment/Interventions: Review of patient past medical history, allergies, medications, health status, including review of consultants reports, laboratory and other test data, was performed as part of comprehensive evaluation and provision of chronic care management services.   SDOH:  (Social Determinants of Health) assessments and interventions performed: Yes SDOH  Interventions    Flowsheet Row Most Recent Value  SDOH Interventions   Financial Strain Interventions Intervention Not Indicated       SDOH Screenings   Alcohol Screen: Not on file  Depression (PHQ2-9): Medium Risk   PHQ-2 Score: 9  Financial Resource Strain: Low Risk    Difficulty of Paying Living Expenses: Not hard at all  Food Insecurity: No Food Insecurity   Worried About Charity fundraiser in the Last Year: Never true   Ran Out of Food in the Last Year: Never true  Housing: Low Risk    Last Housing Risk Score: 0  Physical Activity: Inactive   Days of Exercise per Week: 0 days   Minutes of Exercise per Session:  0 min  Social Connections: Moderately Isolated   Frequency of Communication with Friends and Family: More than three times a week   Frequency of Social Gatherings with Friends and Family: Once a week   Attends Religious Services: Never   Marine scientist or Organizations: No   Attends Archivist Meetings: Never   Marital Status: Living with partner  Stress: No Stress Concern Present   Feeling of Stress : Only a little  Tobacco Use: Low Risk    Smoking Tobacco Use: Never   Smokeless Tobacco Use: Never   Passive Exposure: Not on file  Transportation Needs: No Transportation Needs   Lack of Transportation (Medical): No   Lack of Transportation (Non-Medical): No    CCM Care Plan  Allergies  Allergen Reactions   Codeine Other (See Comments)    "TRIPPED OUT"  DIDN'T LIKE THE WAY IT FELT   Atorvastatin     muscle pain   Hydrocodone     itching   Tramadol    Latex Rash   Zolpidem Other (See Comments)    Sleep walk    Medications Reviewed Today     Reviewed by Royal Hawthorn, CMA (Certified Medical Assistant) on 05/31/21 at [phone]  Med List Status: <None>   Medication Order Taking? Sig Documenting Provider Last Dose Status Informant  ACCU-CHEK SOFTCLIX LANCETS lancets [id number]   [provider]  Active Self  acetaminophen  (TYLENOL 8 HOUR) 650 MG CR tablet [id number]  Take 1 tablet (650 mg total) by mouth every 8 (eight) hours as needed for pain. Steele Sizer, MD  Active Self  albuterol (VENTOLIN HFA) 108 (90 Base) MCG/ACT inhaler [id number]  Inhale 2 puffs into the lungs every 6 (six) hours as needed for wheezing or shortness of breath. Delsa Grana, PA-C  Active   Alcohol Swabs (B-D SINGLE USE SWABS REGULAR) PADS [id number]   [provider]  Active Self  amLODipine (NORVASC) 5 MG tablet [id number]  Take 5 mg by mouth daily. [provider]  Active   ASPIRIN LOW DOSE 81 MG EC tablet [id number]  TAKE 1 TABLET (81 MG TOTAL) BY MOUTH DAILY. Steele Sizer, MD  Active Self  Blood Glucose Monitoring Suppl (ACCU-CHEK AVIVA PLUS) w/Device Drucie Opitz [id number]   [provider]  Active Self           Med Note Marcello Moores, CONNELLAE H   Wed Apr 02, 2019  7:13 PM)    buPROPion (WELLBUTRIN XL) 150 MG 24 hr tablet [id number]  Take 1 tablet by mouth daily. Steele Sizer, MD  Active   Cholecalciferol (VITAMIN D3) 25 MCG (1000 UT) CAPS [id number]  Take 1,000 Units by mouth. [provider]  Active   Cyanocobalamin (VITAMIN B12 PO) [id number]  Take 1 tablet by mouth daily.  [provider]  Active Self  furosemide (LASIX) 20 MG tablet [id number]  Take 1 tablet by mouth daily. Steele Sizer, MD  Active   gentamicin cream (GARAMYCIN) 0.1 % [id number]  Apply 1 application topically 2 (two) times daily. Edrick Kins, DPM  Active   icosapent Ethyl (VASCEPA) 1 g capsule [id number]  Take 2 capsules (2 g total) by mouth 2 (two) times daily. Steele Sizer, MD  Active   insulin NPH-regular Human (70-30) 100 UNIT/ML injection [id number]  Inject 100 Units into the skin daily with supper. Lonia Farber, MD  Active Self           Med Note Gloris Ham, [place] D   Thu  Aug 26, 2020  3:43 PM) Pt only taking once daily at night   irbesartan-hydrochlorothiazide (AVALIDE) 300-12.5 MG tablet [id number]  Take 1 tablet by  mouth daily. Steele Sizer, MD  Active   levocetirizine (XYZAL) 5 MG tablet [id number]  Take 1 tablet (5 mg total) by mouth every evening. Delsa Grana, PA-C  Active   levothyroxine (SYNTHROID) 25 MCG tablet [id number]  Take 1 tablet by mouth daily except, take 1 and 1/2 tablets by mouth on Sunday. Steele Sizer, MD  Active   metFORMIN (GLUCOPHAGE) 500 MG tablet [id number]  Take 1,000 mg by mouth daily with breakfast. [provider]  Active   metoprolol tartrate (LOPRESSOR) 25 MG tablet [id number]  Take 1 tablet (25 mg total) by mouth 2 (two) times daily. Steele Sizer, MD  Active   Misc Natural Products (OSTEO BI-FLEX ADV JOINT SHIELD PO) [id number]  Take by mouth 2 (two) times daily.  [provider]  Active Self  modafinil (PROVIGIL) 100 MG tablet [id number]  Take 1 tablet by mouth daily. Steele Sizer, MD  Active   montelukast (SINGULAIR) 10 MG tablet [id number]  Take 1 tablet by mouth at bedtime. Steele Sizer, MD  Active   omeprazole (PRILOSEC) 40 MG capsule [id number]  Take 1 capsule by mouth daily. Steele Sizer, MD  Active   Potassium 99 MG TABS [id number]  Take by mouth daily at 6 (six) AM. [provider]  Active   pregabalin (LYRICA) 300 MG capsule [id number]  Take 300 mg by mouth 2 (two) times a day. [provider]  Active Self  Probiotic Product (PROBIOTIC-10 PO) [id number]  Take by mouth. [provider]  Active Self  RELION INSULIN SYRINGE 1ML/31G 31G X 5/16" 1 ML [place] [id number]   [provider]  Active Self           Med Note Marcello Moores, CONNELLAE H   Wed Apr 02, 2019  7:14 PM)    rosuvastatin (CRESTOR) 20 MG tablet [id number]  Take 1 tablet (20 mg total) by mouth daily. Steele Sizer, MD  Active   sertraline (ZOLOFT) 100 MG tablet [id number]  Take 1.5 tablets (150 mg total) by mouth daily. Steele Sizer, MD  Active   traZODone (DESYREL) 50 MG tablet [id number]  Take 1 tablet (50 mg total) by mouth at bedtime as needed for  sleep. Steele Sizer, MD  Active   vitamin C (ASCORBIC ACID) 500 MG tablet [id number]  Take 500 mg by mouth 2 (two) times daily.  [provider]  Active Self            Patient Active Problem List   Diagnosis Date Noted   Chronic obstructive pulmonary disease ([place]) 01/28/2021   Atherosclerosis of native arteries of the extremities with ulceration ([place]) 12/03/2020   Diabetes mellitus ([place]) 05/24/2020   B12 deficiency 05/24/2020   Vitamin D deficiency 05/24/2020   Metatarsalgia of left foot 01/15/2020   Major depressive disorder, recurrent episode, moderate ([place]) 04/04/2019   Left arm weakness 04/03/2019   (HFpEF) heart failure with preserved ejection fraction ([place]) 08/31/2017   Moderate persistent asthma 08/31/2017   Hyperlipidemia associated with type 2 diabetes mellitus ([place]) 06/14/2017   Dyslipidemia associated with type 2 diabetes mellitus ([place]) 08/18/2016   Charcot foot due to diabetes mellitus ([place]) 02/22/2016   Acquired abduction deformity of foot 07/12/2015   Osteoarthritis of subtalar joint 07/12/2015   Poorly controlled type 2 diabetes mellitus with neuropathy ([place]) 07/12/2015   Arthritis of foot, degenerative 07/12/2015   Carpal  tunnel syndrome 04/17/2015   Chronic constipation 04/17/2015   Insomnia, persistent 04/17/2015   Stage 3 chronic kidney disease ([place]) 04/17/2015   Decreased exercise tolerance 04/17/2015   Diabetes mellitus with polyneuropathy ([place]) 04/17/2015   Gastro-esophageal reflux disease without esophagitis 04/17/2015   Bursitis, trochanteric 04/17/2015   Cephalalgia 04/17/2015   Hypertension associated with diabetes ([place]) 04/17/2015   Adult hypothyroidism 04/17/2015   Hearing loss 04/17/2015   Chronic recurrent major depressive disorder ([place]) 04/17/2015   Neurogenic claudication ([place]) 04/17/2015   Class 3 severe obesity with serious comorbidity and body mass index (BMI) of 45.0 to 49.9 in adult ([place]) 04/17/2015   Hypo-ovarianism  04/17/2015   Perennial allergic rhinitis with seasonal variation 04/17/2015   Acne erythematosa 04/17/2015   Dyskinesia, tardive 04/17/2015   Memory loss 04/17/2015   Impingement syndrome of shoulder 04/17/2015   Dermatitis, stasis 04/17/2015   Obstructive sleep apnea 05/14/2014   Shortness of breath on exertion 05/06/2014   Mixed hyperlipidemia 02/06/2012   LBP (low back pain) 09/16/2008    Immunization History  Administered Date(s) Administered   Fluad Quad(high Dose 65+) 06/03/2019, 06/18/2020, 05/31/2021   Influenza Split 06/13/2013, 06/16/2014   Influenza, High Dose Seasonal PF 05/24/2015, 06/27/2017, 05/27/2018   Influenza, Seasonal, Injecte, Preservative Fre 06/03/2010, 07/25/2011   Influenza-Unspecified 07/21/2016   PFIZER(Purple Top)SARS-COV-2 Vaccination 11/07/2019, 12/02/2019   Pneumococcal Conjugate-13 07/14/2013   Pneumococcal Polysaccharide-23 10/01/2006, 06/16/2014   Tdap 03/04/2010, 07/20/2015   Zoster, Live 08/12/2012    Conditions to be addressed/monitored:  Hypertension, Hyperlipidemia, Diabetes, Coronary Artery Disease, Asthma, Chronic Kidney Disease, Hypothyroidism, Osteoarthritis, and Insomnia  Care Plan : General Pharmacy (Adult)  Updates made by Germaine Pomfret, RPH since 08/08/2021 12:00 AM     Problem: Hypertension, Hyperlipidemia, Diabetes, Coronary Artery Disease, Asthma, Chronic Kidney Disease, Hypothyroidism, Osteoarthritis, and Insomnia   Priority: High     Long-Range Goal: Patient-Specific Goal   Start Date: 03/11/2021  Expected End Date: 08/08/2022  This Visit's Progress: On track  Recent Progress: On track  Priority: High  Note:   Current Barriers:  Suboptimal therapeutic regimen for hypertension Suboptimal therapeutic regimen for GERD  Pharmacist Clinical Goal(s):  Patient will maintain control of diabetes as evidenced by A1c less than 8%  maintain control of blood pressure as evidenced by BP less than 140/90  through  collaboration with PharmD and provider.   Interventions: 1:1 collaboration with Steele Sizer, MD regarding development and update of comprehensive plan of care as evidenced by provider attestation and co-signature Inter-disciplinary care team collaboration (see longitudinal plan of care) Comprehensive medication review performed; medication list updated in electronic medical record  Hypertension (BP goal <140/90) -Controlled -Current treatment: Amlodipine 5 mg daily  Furosemide 20 mg daily  Irbesartan-HCTZ 300-12.5 mg daily  Metoprolol tartrate 25 mg twice daily  -Medications previously tried: NA  -Current home readings: NA -Current dietary habits: 10-15 glasses water/diet mountain dew. -Current exercise habits: Minimal exercise, limited by dyspnea   -Denies hypotensive/hypertensive symptoms -Educated on Daily salt intake goal < 2300 mg; -Counseled to monitor BP at home 2-3 times weekly, document, and provide log at future appointments -Continue current medications  Hyperlipidemia: (LDL goal < 70) -Controlled -Current treatment: Ezetimibe 10 mg daily  Rosuvastatin 40 mg daily  -Current antiplatelet treatment: Aspirin 81 mg daily  -Medications previously tried: Optometrist ([place]) -Educated on Importance of limiting foods high in cholesterol; -Recommended to continue current medication  Diabetes (A1c goal <8%) -Controlled -Diagnosed 2012  -Current medications: NPH 70/30 100 units with supper  Metformin  500 mg daily  -Medications previously tried: NA  -Current home glucose readings fasting glucose: 80-130  post prandial glucose: NA -Reports hypoglycemic/hyperglycemic symptoms: two instances of hypoglycemia overnight.  -Counseled to take Insulin 15-30 minutes prior to meals  -Recommended to continue current medication  Asthma (Goal: control symptoms and prevent exacerbations) -Controlled -Current treatment  Montelukast 10 mg nightly  -Medications previously tried: NA   -MMRC/CAT score: Short of breath walking from front door to car. Improves with rest after 5 minutes.  -Pulmonary function testing: FEV1/FVC 81%, FEV1 89% (2015)  -Exacerbations requiring treatment in last 6 months: None -Patient did not notice any improvement when using inhalers.  -Recommended to continue current medication  Depression/Anxiety (Goal: Maintain stable mood) -Controlled -Current treatment: Wellbutrin XL 150 mg daily  Modafinil 100 mg daily  Sertraline 100 mg 1.5 tablets daily  Trazodone 50 mg nightly as needed (takes nightly)  -Medications previously tried/failed: NA -waking up once most nights. Takes 1-2 hours before she falls back asleep. Watches TV prior to bedtime. Wakes up and plays games on laptop until she falls back asleep.  -Counseled patient on proper sleep hygiene habits.  -Educated on Benefits of medication for symptom control -Recommended to continue current medication  Hypothyroidism (Goal: Maintain stable thyroid function) -Controlled -Current treatment  Levothyroxine 25 mcg 1 tablet daily, 1.5 tablets on sundays  -Medications previously tried: NA  -Recommended to continue current medication  Osteoarthritis (Goal: Improve symptom control) -Controlled -Current treatment  Acetaminophen CR 650 mg twice daily  Osteo-Bi Flex  -Medications previously tried: NA -Has been taking osteo Bi-flex supplement for ~ 1 year with no noticeable improvement.  -Counseled on appropriate use of acetminophen for osteoarthritis and counseled to avoid doses greater than 3000 mg daily   -Recommended STOPPING Osteo Bi-Flex (ineffective)  GERD (Goal: Prevent Heartburn/Reflux) -Not ideally controlled -Current treatment  Omeprazole 40 mg daily (HS)  -Medications previously tried: Ranitidine  -Still has some belching symptoms, chest pain symptoms few times weekly  -Counseled on trigger identification and avoidance, not lying down for 30 minutes after eating  -Counseled to  take omeprazole on empty stomach.  -Recommended to continue current medication  Chronic Kidney Disease Stage 3a  -All medications assessed for renal dosing and appropriateness in chronic kidney disease. -Rosuvastatin: Acceptable in eGFR >30. Could consider switching to atorvastatin if renal function declines  -Metformin dose appropriate given current kidney function, but will require frequent monitoring to ensure stable kidney function.  -Recommended to continue current medication  Patient Goals/Self-Care Activities Patient will:  - check glucose daily, document, and provide at future appointments check blood pressure weekly, document, and provide at future appointments  Follow Up Plan: Telephone follow up appointment with care management team member scheduled for:  11/30/2021 at 2:15 PM    Medication Assistance: None required.  Patient affirms current coverage meets needs.  Compliance/Adherence/Medication fill history: Care Gaps: Shingrix Covid Booster  Star-Rating Drugs: Irbesartan-HTCZ 300-12.5 mg last filled in 01/20/2021 for 30 days and 12/18/2020 for 30 day supply at [facility]. Metformin 500 mg last filled in 01/20/2021 for 30 days and 12/18/2020 for 30 day supply at [facility]. Rosuvastatin 20 mg last filled in 01/20/2021 for 30 days and 12/18/2020 for 30 day supply at [facility].  Patient reports she continues to receive monthly adherence packs from [facility].    Patient's preferred pharmacy is:  [facility] [place], [address] [place] [address] Phone: [phone] Fax: [phone]  [place], [place] [address] Phone: [phone] Fax: [phone]  Uses pill box? No - Utilizes [employer] Pillpack Pt endorses 100% compliance  We discussed: Current pharmacy is preferred with insurance plan and patient is satisfied with pharmacy  services Patient decided to: Continue current medication management strategy  Care Plan and Follow Up Patient Decision:  Patient agrees to Care Plan and Follow-up.  Plan: Telephone follow up appointment with care management team member scheduled for:  11/30/2021 at 2:15 PM  Malva Limes, [place] Pharmacist Practitioner  [facility] [phone]

## 2021-08-08 NOTE — Patient Instructions (Signed)
Visit Information It was great speaking with you today!  Please let me know if you have any questions about our visit.   Goals Addressed             This Visit's Progress    Monitor and Manage My Blood Sugar-Diabetes Type 2       Timeframe:  Long-Range Goal Priority:  High Start Date: 03/11/2021                             Expected End Date: 07/12/2022                      Follow Up within 90 days    - check blood sugar at prescribed times - check blood sugar if I feel it is too high or too low - take the blood sugar log to all doctor visits    Why is this important?   Checking your blood sugar at home helps to keep it from getting very high or very low.  Writing the results in a diary or log helps the doctor know how to care for you.  Your blood sugar log should have the time, date and the results.  Also, write down the amount of insulin or other medicine that you take.  Other information, like what you ate, exercise done and how you were feeling, will also be helpful.     Notes:         Patient Care Plan: General Pharmacy (Adult)     Problem Identified: Hypertension, Hyperlipidemia, Diabetes, Coronary Artery Disease, Asthma, Chronic Kidney Disease, Hypothyroidism, Osteoarthritis, and Insomnia   Priority: High     Long-Range Goal: Patient-Specific Goal   Start Date: 03/11/2021  Expected End Date: 08/08/2022  This Visit's Progress: On track  Recent Progress: On track  Priority: High  Note:   Current Barriers:  Suboptimal therapeutic regimen for hypertension Suboptimal therapeutic regimen for GERD  Pharmacist Clinical Goal(s):  Patient will maintain control of diabetes as evidenced by A1c less than 8%  maintain control of blood pressure as evidenced by BP less than 140/90  through collaboration with PharmD and provider.   Interventions: 1:1 collaboration with Steele Sizer, MD regarding development and update of comprehensive plan of care as evidenced by provider  attestation and co-signature Inter-disciplinary care team collaboration (see longitudinal plan of care) Comprehensive medication review performed; medication list updated in electronic medical record  Hypertension (BP goal <140/90) -Controlled -Current treatment: Amlodipine 5 mg daily  Furosemide 20 mg daily  Irbesartan-HCTZ 300-12.5 mg daily  Metoprolol tartrate 25 mg twice daily  -Medications previously tried: NA  -Current home readings: NA -Current dietary habits: 10-15 glasses water/diet mountain dew. -Current exercise habits: Minimal exercise, limited by dyspnea   -Denies hypotensive/hypertensive symptoms -Educated on Daily salt intake goal < 2300 mg; -Counseled to monitor BP at home 2-3 times weekly, document, and provide log at future appointments -Continue current medications  Hyperlipidemia: (LDL goal < 70) -Controlled -Current treatment: Ezetimibe 10 mg daily  Rosuvastatin 40 mg daily  -Current antiplatelet treatment: Aspirin 81 mg daily  -Medications previously tried: Optometrist ([facility]) -Educated on Importance of limiting foods high in cholesterol; -Recommended to continue current medication  Diabetes (A1c goal <8%) -Controlled -Diagnosed 2012  -Current medications: NPH 70/30 100 units with supper  Metformin 500 mg daily  -Medications previously tried: NA  -Current home glucose readings fasting glucose: 80-130  post prandial glucose: NA -Reports  hypoglycemic/hyperglycemic symptoms: two instances of hypoglycemia overnight.  -Counseled to take Insulin 15-30 minutes prior to meals  -Recommended to continue current medication  Asthma (Goal: control symptoms and prevent exacerbations) -Controlled -Current treatment  Montelukast 10 mg nightly  -Medications previously tried: NA  -MMRC/CAT score: Short of breath walking from front door to car. Improves with rest after 5 minutes.  -Pulmonary function testing: FEV1/FVC 81%, FEV1 89% (2015)  -Exacerbations requiring  treatment in last 6 months: None -Patient did not notice any improvement when using inhalers.  -Recommended to continue current medication  Depression/Anxiety (Goal: Maintain stable mood) -Controlled -Current treatment: Wellbutrin XL 150 mg daily  Modafinil 100 mg daily  Sertraline 100 mg 1.5 tablets daily  Trazodone 50 mg nightly as needed (takes nightly)  -Medications previously tried/failed: NA -waking up once most nights. Takes 1-2 hours before she falls back asleep. Watches TV prior to bedtime. Wakes up and plays games on laptop until she falls back asleep.  -Counseled patient on proper sleep hygiene habits.  -Educated on Benefits of medication for symptom control -Recommended to continue current medication  Hypothyroidism (Goal: Maintain stable thyroid function) -Controlled -Current treatment  Levothyroxine 25 mcg 1 tablet daily, 1.5 tablets on sundays  -Medications previously tried: NA  -Recommended to continue current medication  Osteoarthritis (Goal: Improve symptom control) -Controlled -Current treatment  Acetaminophen CR 650 mg twice daily  Osteo-Bi Flex  -Medications previously tried: NA -Has been taking osteo Bi-flex supplement for ~ 1 year with no noticeable improvement.  -Counseled on appropriate use of acetminophen for osteoarthritis and counseled to avoid doses greater than 3000 mg daily   -Recommended STOPPING Osteo Bi-Flex (ineffective)  GERD (Goal: Prevent Heartburn/Reflux) -Not ideally controlled -Current treatment  Omeprazole 40 mg daily (HS)  -Medications previously tried: Ranitidine  -Still has some belching symptoms, chest pain symptoms few times weekly  -Counseled on trigger identification and avoidance, not lying down for 30 minutes after eating  -Counseled to take omeprazole on empty stomach.  -Recommended to continue current medication  Chronic Kidney Disease Stage 3a  -All medications assessed for renal dosing and appropriateness in chronic  kidney disease. -Rosuvastatin: Acceptable in eGFR >30. Could consider switching to atorvastatin if renal function declines  -Metformin dose appropriate given current kidney function, but will require frequent monitoring to ensure stable kidney function.  -Recommended to continue current medication  Patient Goals/Self-Care Activities Patient will:  - check glucose daily, document, and provide at future appointments check blood pressure weekly, document, and provide at future appointments  Follow Up Plan: Telephone follow up appointment with care management team member scheduled for:  11/30/2021 at 2:15 PM    Patient agreed to services and verbal consent obtained.   Patient verbalizes understanding of instructions provided today and agrees to view in [place].   Malva Limes, [place] Pharmacist Practitioner  [facility] [phone]

## 2021-08-10 DIAGNOSIS — N1831 Chronic kidney disease, stage 3a: Secondary | ICD-10-CM | POA: Diagnosis not present

## 2021-08-10 DIAGNOSIS — Z794 Long term (current) use of insulin: Secondary | ICD-10-CM | POA: Diagnosis not present

## 2021-08-10 DIAGNOSIS — E1122 Type 2 diabetes mellitus with diabetic chronic kidney disease: Secondary | ICD-10-CM

## 2021-08-10 DIAGNOSIS — I129 Hypertensive chronic kidney disease with stage 1 through stage 4 chronic kidney disease, or unspecified chronic kidney disease: Secondary | ICD-10-CM

## 2021-08-10 DIAGNOSIS — E1169 Type 2 diabetes mellitus with other specified complication: Secondary | ICD-10-CM | POA: Diagnosis not present

## 2021-08-10 DIAGNOSIS — E785 Hyperlipidemia, unspecified: Secondary | ICD-10-CM

## 2021-08-10 DIAGNOSIS — E1142 Type 2 diabetes mellitus with diabetic polyneuropathy: Secondary | ICD-10-CM | POA: Diagnosis not present

## 2021-08-10 DIAGNOSIS — Z7984 Long term (current) use of oral hypoglycemic drugs: Secondary | ICD-10-CM | POA: Diagnosis not present

## 2021-08-23 ENCOUNTER — Other Ambulatory Visit: Payer: Self-pay | Admitting: Family Medicine

## 2021-08-23 DIAGNOSIS — E039 Hypothyroidism, unspecified: Secondary | ICD-10-CM

## 2021-08-23 DIAGNOSIS — E1169 Type 2 diabetes mellitus with other specified complication: Secondary | ICD-10-CM

## 2021-08-23 NOTE — Telephone Encounter (Signed)
Requested medication (s) are due for refill today: NO  Requested medication (s) are on the active medication list: YES  Last refill:  07/30/21, 3 weeks ago filled for 90 day (mail order)  Future visit scheduled: yes 09/30/21  Notes to clinic:  requesting early, please assess.   Requested Prescriptions  Pending Prescriptions Disp Refills   irbesartan-hydrochlorothiazide (AVALIDE) 300-12.5 MG tablet [Pharmacy Med Name: Irbesartan/Hydrochlorothiazide 300mg -12.5mg  Tablet] 90 tablet 0    Sig: Take 1 tablet by mouth daily.     Cardiovascular: ARB + Diuretic Combos Failed - 08/23/2021  5:34 AM      Failed - K in normal range and within 180 days    Potassium  Date Value Ref Range Status  01/28/2021 4.7 3.5 - 5.3 mmol/L Final          Failed - Na in normal range and within 180 days    Sodium  Date Value Ref Range Status  01/28/2021 140 135 - 146 mmol/L Final  09/24/2020 141 134 - 144 mmol/L Final          Failed - Cr in normal range and within 180 days    Creat  Date Value Ref Range Status  01/28/2021 1.33 (H) 0.60 - 0.93 mg/dL Final    Comment:    For patients >35 years of age, the reference limit for Creatinine is approximately 13% higher for people identified as African-American. .    Creatinine, Urine  Date Value Ref Range Status  01/28/2018 41 20 - 275 mg/dL Final          Failed - Ca in normal range and within 180 days    Calcium  Date Value Ref Range Status  01/28/2021 10.0 8.6 - 10.4 mg/dL Final          Passed - Patient is not pregnant      Passed - Last BP in normal range    BP Readings from Last 1 Encounters:  05/31/21 126/72          Passed - Valid encounter within last 6 months    Recent Outpatient Visits           2 months ago Type 2 diabetes mellitus with diabetic polyneuropathy, with long-term current use of insulin [facility])   [facility] Steele Sizer, MD   5 months ago Upper respiratory tract infection, unspecified type    [facility] Delsa Grana, PA-C   6 months ago Atherosclerosis of native arteries of the extremities with ulceration [facility])   [facility] Steele Sizer, MD   11 months ago Dyslipidemia associated with type 2 diabetes mellitus [facility])   [facility] Steele Sizer, MD   1 year ago Cough   [place], MD       Future Appointments             In 1 week  [facility] [place]   In 1 month Steele Sizer, MD [facility]             levothyroxine (SYNTHROID) 25 MCG tablet [Pharmacy Med Name: Levothyroxine Sodium 64mcg Tablet] 100 tablet 0    Sig: Take 1 tablet by mouth daily except, take 1 and 1/2 tablets by mouth on Sunday.     Endocrinology:  Hypothyroid Agents Failed - 08/23/2021  5:34 AM      Failed - TSH needs to be rechecked within 3 months after an  abnormal result. Refill until TSH is due.      Passed - TSH in normal range and within 360 days    TSH  Date Value Ref Range Status  01/28/2021 1.71 0.40 - 4.50 mIU/L Final          Passed - Valid encounter within last 12 months    Recent Outpatient Visits           2 months ago Type 2 diabetes mellitus with diabetic polyneuropathy, with long-term current use of insulin [facility])   [facility] Steele Sizer, MD   5 months ago Upper respiratory tract infection, unspecified type   [facility] Delsa Grana, PA-C   6 months ago Atherosclerosis of native arteries of the extremities with ulceration [facility])   [name] Steele Sizer, MD   11 months ago Dyslipidemia associated with type 2 diabetes mellitus [facility])   [facility] Steele Sizer, MD   1 year ago Cough   [place], MD       Future Appointments             In 1 week  [facility], [place]   In 1 month Steele Sizer, MD [facility], [facility]

## 2021-08-24 ENCOUNTER — Telehealth: Payer: Self-pay

## 2021-08-24 NOTE — Progress Notes (Signed)
Chronic Care Management Pharmacy Assistant   Name: Summer [name]  MRN: [id number] DOB: May 23, 1951  Reason for Encounter:Hypertension Disease State Call.   Recent office visits:  No recent office visit  Recent consult visits:  No recent consult visit  Hospital visits:  None in previous 6 months  Medications: Outpatient Encounter Medications as of 08/24/2021  Medication Sig Note   irbesartan-hydrochlorothiazide (AVALIDE) 300-12.5 MG tablet Take 1 tablet by mouth daily.    levothyroxine (SYNTHROID) 25 MCG tablet Take 1 tablet by mouth daily except, take 1 and 1/2 tablets by mouth on Sunday.    ACCU-CHEK SOFTCLIX LANCETS lancets     acetaminophen (TYLENOL 8 HOUR) 650 MG CR tablet Take 1 tablet (650 mg total) by mouth every 8 (eight) hours as needed for pain.    albuterol (VENTOLIN HFA) 108 (90 Base) MCG/ACT inhaler Inhale 2 puffs into the lungs every 6 (six) hours as needed for wheezing or shortness of breath.    Alcohol Swabs (B-D SINGLE USE SWABS REGULAR) PADS     amLODipine (NORVASC) 5 MG tablet Take 1 tablet by mouth daily.    ASPIRIN LOW DOSE 81 MG EC tablet TAKE 1 TABLET (81 MG TOTAL) BY MOUTH DAILY.    Blood Glucose Monitoring Suppl (ACCU-CHEK AVIVA PLUS) w/Device KIT     buPROPion (WELLBUTRIN XL) 150 MG 24 hr tablet Take 1 tablet (150 mg total) by mouth daily.    Cholecalciferol (VITAMIN D3) 25 MCG (1000 UT) CAPS Take 1,000 Units by mouth.    Cyanocobalamin (VITAMIN B12 PO) Take 1 tablet by mouth daily.     ezetimibe (ZETIA) 10 MG tablet Take 1 tablet (10 mg total) by mouth daily.    furosemide (LASIX) 20 MG tablet Take 1 tablet by mouth daily.    gentamicin cream (GARAMYCIN) 0.1 % Apply 1 application topically 2 (two) times daily.    insulin NPH-regular Human (70-30) 100 UNIT/ML injection Inject 100 Units into the skin daily with supper. 08/26/2020: Pt only taking once daily at night    levocetirizine (XYZAL) 5 MG tablet Take 1 tablet (5 mg total) by mouth every evening.     metFORMIN (GLUCOPHAGE) 500 MG tablet Take 1,000 mg by mouth daily with breakfast.    metoprolol tartrate (LOPRESSOR) 25 MG tablet Take 1 tablet (25 mg total) by mouth 2 (two) times daily.    Misc Natural Products (OSTEO BI-FLEX ADV JOINT SHIELD PO) Take by mouth 2 (two) times daily.     modafinil (PROVIGIL) 100 MG tablet Take 1 tablet (100 mg total) by mouth daily.    montelukast (SINGULAIR) 10 MG tablet Take 1 tablet (10 mg total) by mouth at bedtime.    omeprazole (PRILOSEC) 40 MG capsule Take 1 capsule (40 mg total) by mouth daily.    Potassium 99 MG TABS Take by mouth daily at 6 (six) AM.    Potassium Gluconate 2.5 MEQ TABS Take by mouth.    pregabalin (LYRICA) 300 MG capsule Take 300 mg by mouth 2 (two) times a day.    Probiotic Product (PROBIOTIC-10 PO) Take by mouth.    RELION INSULIN SYRINGE 1ML/31G 31G X 5/16" 1 ML MISC     rosuvastatin (CRESTOR) 40 MG tablet Take 1 tablet (40 mg total) by mouth daily.    sertraline (ZOLOFT) 100 MG tablet Take 1.5 tablets (150 mg total) by mouth daily.    traZODone (DESYREL) 100 MG tablet Take 1 tablet (100 mg total) by mouth at bedtime as needed for sleep.  No facility-administered encounter medications on file as of 08/24/2021.   Care Gaps: Covid-19 Pfizer risk series Star Rating Drugs: Irbesartan-HTCZ 300-12.5 mg last filled in 01/20/2021 for 30 days and 12/18/2020 for 30 day supply at [facility]. Metformin 500 mg last filled in 01/20/2021 for 30 days and 12/18/2020 for 30 day supply at [facility]. Rosuvastatin 20 mg last filled in 01/20/2021 for 30 days and 12/18/2020 for 30 day supply at [facility].  Patient reports she continues to receive monthly adherence packs from [facility].  .  Medication Fill Gaps: None  Reviewed chart prior to disease state call. Spoke with patient regarding BP  Recent Office Vitals: BP Readings from Last 3 Encounters:  05/31/21 126/72  03/23/21 123/79  01/28/21 122/74   Pulse  Readings from Last 3 Encounters:  05/31/21 85  03/23/21 72  01/28/21 90    Wt Readings from Last 3 Encounters:  05/31/21 241 lb 6.4 oz (109.5 kg)  03/23/21 242 lb (109.8 kg)  01/28/21 242 lb (109.8 kg)     Kidney Function Lab Results  Component Value Date/Time   CREATININE 1.33 (H) 01/28/2021 03:00 PM   CREATININE 1.21 (H) 11/23/2020 09:37 AM   CREATININE 1.50 (H) 09/24/2020 01:03 PM   CREATININE 1.42 (H) 10/14/2019 11:42 AM   GFRNONAA 40 (L) 01/28/2021 03:00 PM   GFRAA 46 (L) 01/28/2021 03:00 PM    BMP Latest Ref Rng & Units 01/28/2021 11/23/2020 09/24/2020  Glucose 65 - 99 mg/dL 126(H) - 106(H)  BUN 7 - 25 mg/dL 50(H) 45(H) 34(H)  Creatinine 0.60 - 0.93 mg/dL 1.33(H) 1.21(H) 1.50(H)  BUN/Creat Ratio 6 - 22 (calc) 38(H) - 23  Sodium 135 - 146 mmol/L 140 - 141  Potassium 3.5 - 5.3 mmol/L 4.7 - 4.6  Chloride 98 - 110 mmol/L 104 - 101  CO2 20 - 32 mmol/L 27 - 24  Calcium 8.6 - 10.4 mg/dL 10.0 - 10.0    Current antihypertensive regimen:  Amlodipine 5 mg daily  Furosemide 20 mg daily  Irbesartan-HCTZ 300-12.5 mg daily  Metoprolol tartrate 25 mg twice daily   What recent interventions/DTPs have been made by any provider to improve Blood Pressure control since last CPP Visit:   08/03/2021 Daron Offer [facility] ([facility]) Recommended STOPPING furosemide (ineffective + increased risk of dehydration or AKI with HCTZ use)  Any recent hospitalizations or ED visits since last visit with CPP? No  I have attempted without success to contact this patient by phone three times to do her hypertension Disease State call. I left a Voice message for patient to return my call.LVM 12/14,12/15,12/16  Adherence Review: Is the patient currently on ACE/ARB medication? Yes Does the patient have >5 day gap between last estimated fill dates? Yes  Telephone follow up appointment with care management team member scheduled for:  11/30/2021 at 2:15 PM    [place] Pharmacist  Assistant [phone]

## 2021-08-25 ENCOUNTER — Telehealth: Payer: Self-pay

## 2021-08-30 ENCOUNTER — Ambulatory Visit (INDEPENDENT_AMBULATORY_CARE_PROVIDER_SITE_OTHER): Payer: Medicare PPO

## 2021-08-30 VITALS — BP 110/62 | HR 67 | Temp 98.1°F | Resp 16 | Ht 62.0 in | Wt 247.9 lb

## 2021-08-30 DIAGNOSIS — Z5941 Food insecurity: Secondary | ICD-10-CM

## 2021-08-30 DIAGNOSIS — Z0000 Encounter for general adult medical examination without abnormal findings: Secondary | ICD-10-CM | POA: Diagnosis not present

## 2021-08-30 NOTE — Progress Notes (Signed)
Subjective:   Summer [name] is a 70 y.o. female who presents for Medicare Annual (Subsequent) preventive examination.  Review of Systems     Cardiac Risk Factors include: advanced age (>45mn, >>71women);diabetes mellitus;dyslipidemia;obesity (BMI >30kg/m2);hypertension     Objective:    Today's Vitals   08/30/21 1542 08/30/21 1543  BP: 110/62   Pulse: 67   Resp: 16   Temp: 98.1 F (36.7 C)   TempSrc: Oral   SpO2: 96%   Weight: 247 lb 14.4 oz (112.4 kg)   Height: _0  (1.575 m)   PainSc:  10-Worst pain ever   Body mass index is 45.34 kg/m.  Advanced Directives 08/30/2021 08/26/2020 07/31/2019 04/07/2019 04/02/2019 08/11/2018 07/26/2018  Does Patient Have a Medical Advance Directive? Yes Yes Yes Yes No Yes Yes  Type of AParamedicof AStokesLiving will HLismoreLiving will HOlympia HeightsLiving will Living will - Living will;Healthcare Power of [place]oncoLiving will  Does patient want to make changes to medical advance directive? - - - No - Patient declined - - -  Copy of H[place]Intoshin Chart? Yes - validated most recent copy scanned in chart (See row information) Yes - validated most recent copy scanned in chart (See row information) Yes - validated most recent copy scanned in chart (See row information) - - - Yes - validated most recent copy scanned in chart (See row information)  Would patient like information on creating a medical advance directive? - - - - No - Patient declined - -  Some encounter information is confidential and restricted. Go to Review Flowsheets activity to see all data.    Current Medications (verified) Outpatient Encounter Medications as of 08/30/2021  Medication Sig   ACCU-CHEK SOFTCLIX LANCETS lancets    acetaminophen (TYLENOL 8 HOUR) 650 MG CR tablet Take 1 tablet (650 mg total) by mouth every 8 (eight) hours as needed for pain.   Alcohol Swabs  (B-D SINGLE USE SWABS REGULAR) PADS    amLODipine (NORVASC) 5 MG tablet Take 1 tablet by mouth daily.   ASPIRIN LOW DOSE 81 MG EC tablet TAKE 1 TABLET (81 MG TOTAL) BY MOUTH DAILY.   Blood Glucose Monitoring Suppl (ACCU-CHEK AVIVA PLUS) w/Device KIT    buPROPion (WELLBUTRIN XL) 150 MG 24 hr tablet Take 1 tablet (150 mg total) by mouth daily.   Cholecalciferol (VITAMIN D3) 25 MCG (1000 UT) CAPS Take 1,000 Units by mouth.   Cyanocobalamin (VITAMIN B12 PO) Take 1 tablet by mouth daily.    ezetimibe (ZETIA) 10 MG tablet Take 1 tablet (10 mg total) by mouth daily.   furosemide (LASIX) 20 MG tablet Take 1 tablet by mouth daily.   insulin NPH-regular Human (70-30) 100 UNIT/ML injection Inject 100 Units into the skin daily with supper.   irbesartan-hydrochlorothiazide (AVALIDE) 300-12.5 MG tablet Take 1 tablet by mouth daily.   levocetirizine (XYZAL) 5 MG tablet Take 1 tablet (5 mg total) by mouth every evening.   levothyroxine (SYNTHROID) 25 MCG tablet Take 1 tablet by mouth daily except, take 1 and 1/2 tablets by mouth on Sunday.   metFORMIN (GLUCOPHAGE) 500 MG tablet Take 1,000 mg by mouth daily with breakfast.   metoprolol tartrate (LOPRESSOR) 25 MG tablet Take 1 tablet (25 mg total) by mouth 2 (two) times daily.   Misc Natural Products (OSTEO BI-FLEX ADV JOINT SHIELD PO) Take by mouth 2 (two) times daily.    modafinil (PROVIGIL) 100 MG  tablet Take 1 tablet (100 mg total) by mouth daily.   montelukast (SINGULAIR) 10 MG tablet Take 1 tablet (10 mg total) by mouth at bedtime.   omeprazole (PRILOSEC) 40 MG capsule Take 1 capsule (40 mg total) by mouth daily.   Potassium 99 MG TABS Take by mouth daily at 6 (six) AM.   pregabalin (LYRICA) 300 MG capsule Take 300 mg by mouth 2 (two) times a day.   Probiotic Product (PROBIOTIC-10 PO) Take by mouth.   RELION INSULIN SYRINGE 1ML/31G 31G X 5/16" 1 ML MISC    rosuvastatin (CRESTOR) 40 MG tablet Take 1 tablet (40 mg total) by mouth daily.   sertraline  (ZOLOFT) 100 MG tablet Take 1.5 tablets (150 mg total) by mouth daily.   traZODone (DESYREL) 100 MG tablet Take 1 tablet (100 mg total) by mouth at bedtime as needed for sleep.   dapagliflozin propanediol (FARXIGA) 10 MG TABS tablet Take 1 tablet by mouth in the morning. (Patient not taking: Reported on 08/30/2021)   [DISCONTINUED] albuterol (VENTOLIN HFA) 108 (90 Base) MCG/ACT inhaler Inhale 2 puffs into the lungs every 6 (six) hours as needed for wheezing or shortness of breath.   [DISCONTINUED] gentamicin cream (GARAMYCIN) 0.1 % Apply 1 application topically 2 (two) times daily.   [DISCONTINUED] Potassium Gluconate 2.5 MEQ TABS Take by mouth.   No facility-administered encounter medications on file as of 08/30/2021.    Allergies (verified) Codeine, Atorvastatin, Hydrocodone, Tramadol, Latex, and Zolpidem   History: Past Medical History:  Diagnosis Date   Anemia    Arthritis    Asthma    B12 deficiency    Back pain    Carpal tunnel syndrome    Chronic kidney disease    Constipation    Depression    Depressive disorder    Diabetes mellitus    Dyspnea    Fluid retention    Foot pain    GERD (gastroesophageal reflux disease)    Headache    History of hiatal hernia    Hyperlipidemia    Hypertension    IBS (irritable bowel syndrome)    Insomnia    Joint pain    Lumbago    Memory loss    Obesity    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)    Pneumonia    Rhinitis, allergic    Rosacea    Sleep apnea    SOB (shortness of breath)    Thyroid disease    Unspecified hearing loss    Unspecified hereditary and idiopathic peripheral neuropathy    Unspecified sleep apnea    Vitamin D deficiency    Past Surgical History:  Procedure Laterality Date   ABDOMINAL HYSTERECTOMY  1975   ANKLE SURGERY Left approx Jan 2018   [facility]   CATARACT EXTRACTION  01/2011   right   COLONOSCOPY WITH PROPOFOL N/A 08/19/2019   Procedure: COLONOSCOPY WITH PROPOFOL;  Surgeon: Jonathon Bellows, MD;   Location: [facility] ENDOSCOPY;  Service: Gastroenterology;  Laterality: N/A;   eye lid surgery  2013   bilateral   FOOT SURGERY     LOWER EXTREMITY ANGIOGRAPHY Left 11/23/2020   Procedure: LOWER EXTREMITY ANGIOGRAPHY;  Surgeon: Katha Cabal, MD;  Location: [facility];  Service: Cardiovascular;  Laterality: Left;   NECK SURGERY     SPINE SURGERY     TUBAL LIGATION     VAGINAL HYSTERECTOMY  1989   Family History  Problem Relation Age of Onset   Aneurysm Mother    Aortic  aneurysm Mother    Hypertension Mother    Hyperlipidemia Mother    Heart disease Mother    Obesity Mother    Heart attack Maternal Grandfather    Diabetes Maternal Grandfather    Social History   Socioeconomic History   Marital status: Significant Other    Spouse name: [name] Rump   Number of children: 1   Years of education: Not on file   Highest education level: High school graduate  Occupational History   Occupation: retired  Tobacco Use   Smoking status: Never   Smokeless tobacco: Never  Vaping Use   Vaping Use: Never used  Substance and Sexual Activity   Alcohol use: No    Alcohol/week: 0.0 standard drinks   Drug use: No   Sexual activity: Yes    Partners: Male  Other Topics Concern   Not on file  Social History Narrative   Son lives in [place] (retired from [employer]) and her granddaughter (87yr old[place]   Social Determinants of Health   Financial Resource Strain: Medium Risk   Difficulty of Paying Living Expenses: Somewhat hard  Food Insecurity: FLandscape architectPresent   Worried About RCharity fundraiserin the Last Year: Sometimes true   RArboriculturistin the Last Year: Never true  Transportation Needs: No Transportation Needs   Lack of Transportation (Medical): No   Lack of Transportation (Non-Medical): No  Physical Activity: Inactive   Days of Exercise per Week: 0 days   Minutes of Exercise per Session: 0 min  Stress: No Stress Concern Present    Feeling of Stress : Not at all  Social Connections: Moderately Isolated   Frequency of Communication with Friends and Family: More than three times a week   Frequency of Social Gatherings with Friends and Family: Once a week   Attends Religious Services: Never   AMarine scientistor Organizations: No   Attends CMusic therapist Never   Marital Status: Living with partner    Tobacco Counseling Counseling given: Not Answered   Clinical Intake:  Pre-visit preparation completed: Yes  Pain : 0-10 Pain Score: 10-Worst pain ever Pain Type: Chronic pain Pain Location: Knee Pain Orientation: Right, Left Pain Descriptors / Indicators: Aching, Sore Pain Onset: More than a month ago Pain Frequency: Constant     BMI - recorded: 45.34 Nutritional Status: BMI > 30  Obese Nutritional Risks: None Diabetes: Yes CBG done?: No Did pt. bring in CBG monitor from home?: No  How often do you need to have someone help you when you read instructions, pamphlets, or other written materials from your doctor or pharmacy?: 1 - Never  Nutrition Risk Assessment:  Has the patient had any N/V/D within the last 2 months?  No  Does the patient have any non-healing wounds?  No  Has the patient had any unintentional weight loss or weight gain?  No   Diabetes:  Is the patient diabetic?  Yes  If diabetic, was a CBG obtained today?  No  Did the patient bring in their glucometer from home?  No  How often do you monitor your CBG's? daily.   Financial Strains and Diabetes Management:  Are you having any financial strains with the device, your supplies or your medication? Yes . FWilder Glade- unable to afford; [facility] Does the patient want to be seen by Chronic Care Management for management of their diabetes?  Yes  -  already enrolled Would the patient like to be referred to a Nutritionist or for Diabetic Management?  No   Diabetic Exams:  Diabetic Eye Exam:  Completed 07/08/21 negative retinopathy.   Diabetic Foot Exam: Completed 07/14/21.   Interpreter Needed?: No  Information entered by :: Clemetine Marker LPN   Activities of Daily Living In your present state of health, do you have any difficulty performing the following activities: 08/30/2021 05/31/2021  Hearing? N N  Vision? Y N  Difficulty concentrating or making decisions? N N  Walking or climbing stairs? Y Y  Dressing or bathing? N N  Doing errands, shopping? N N  Preparing Food and eating ? N -  Using the Toilet? N -  In the past six months, have you accidently leaked urine? Y -  Comment wears pads for protection -  Do you have problems with loss of bowel control? N -  Managing your Medications? N -  Managing your Finances? N -  Housekeeping or managing your Housekeeping? N -  Some recent data might be hidden    Patient Care Team: Steele Sizer, MD as PCP - General (Family Medicine) End, Harrell Gave, MD as Consulting Physician (Cardiology) Lonia Farber, MD as Consulting Physician (Endocrinology) Sharlet Salina, MD as Referring Physician (Physical Medicine and Rehabilitation) Germaine Pomfret, [facility] (Pharmacist) Anthonette Legato, MD (Nephrology) Edrick Kins, DPM as Consulting Physician (Podiatry) Schnier, Dolores Lory, MD (Vascular Surgery)  Indicate any recent Medical Services you may have received from other than [facility] providers in the past year (date may be approximate).     Assessment:   This is a routine wellness examination for Chiara.  Hearing/Vision screen Hearing Screening - Comments:: Pt denies hearing difficulty Vision Screening - Comments:: [facility] screen[facility]   Dietary issues and exercise activities discussed: Current Exercise Habits: The patient does not participate in regular exercise at present, Exercise limited by: orthopedic condition(s)   Goals Addressed   None    Depression Screen PHQ 2/9 Scores 08/30/2021  05/31/2021 03/23/2021 01/28/2021 09/24/2020 08/26/2020 08/17/2020  PHQ - 2 Score 6 6 0 4 6 0 0  PHQ- 9 Score 11 9 0 15 15 - 8    Fall Risk Fall Risk  08/30/2021 05/31/2021 03/23/2021 01/28/2021 09/24/2020  Falls in the past year? 0 0 0 0 0  Comment - - - - -  Number falls in past yr: 0 - 0 0 0  Injury with Fall? 0 - 0 0 0  Risk Factor Category  - - - - -  Comment - - - - -  Risk for fall due to : Impaired balance/gait - - - -  Risk for fall due to: Comment - - - - -  Follow up Falls prevention discussed Falls prevention discussed - - -    FALL RISK PREVENTION PERTAINING TO THE HOME:  Any stairs in or around the home? No  If so, are there any without handrails? No  Home free of loose throw rugs in walkways, pet beds, electrical cords, etc? No  - advised to remove Adequate lighting in your home to reduce risk of falls? Yes   ASSISTIVE DEVICES UTILIZED TO PREVENT FALLS:  Life alert? No  Use of a cane, walker or w/c? Yes  Grab bars in the bathroom? Yes  Shower chair or bench in shower? Yes  Elevated toilet seat or a handicapped toilet? Yes   TIMED UP AND GO:  Was the test performed? Yes .  Length of time to ambulate 10 feet: 7 sec.   Gait slow and steady with assistive device  Cognitive Function:     6CIT Screen 08/26/2020 07/31/2019 07/26/2018 06/27/2017  What Year? 0 points 0 points 0 points 0 points  What month? 0 points 0 points 0 points 0 points  What time? 0 points 0 points 0 points 0 points  Count back from 20 0 points 0 points 0 points 0 points  Months in reverse 0 points 0 points 0 points 0 points  Repeat phrase 0 points 0 points 0 points 0 points  Total Score 0 0 0 0    Immunizations Immunization History  Administered Date(s) Administered   Fluad Quad(high Dose 65+) 06/03/2019, 06/18/2020, 05/31/2021   Influenza Split 06/13/2013, 06/16/2014   Influenza, High Dose Seasonal PF 05/24/2015, 06/27/2017, 05/27/2018   Influenza, Seasonal, Injecte, Preservative Fre  06/03/2010, 07/25/2011   Influenza-Unspecified 07/21/2016   PFIZER(Purple Top)SARS-COV-2 Vaccination 11/07/2019, 12/02/2019   Pneumococcal Conjugate-13 07/14/2013   Pneumococcal Polysaccharide-23 10/01/2006, 06/16/2014   Tdap 03/04/2010, 07/20/2015   Zoster, Live 08/12/2012    TDAP status: Up to date  Flu Vaccine status: Up to date  Pneumococcal vaccine status: Up to date  Covid-19 vaccine status: Completed vaccines  Qualifies for Shingles Vaccine? Yes   Zostavax completed Yes   Shingrix Completed?: No.    Education has been provided regarding the importance of this vaccine. Patient has been advised to call insurance company to determine out of pocket expense if they have not yet received this vaccine. Advised may also receive vaccine at local pharmacy or Health Dept. Verbalized acceptance and understanding.  Screening Tests Health Maintenance  Topic Date Due   COVID-19 Vaccine (3 - Pfizer risk series) 12/30/2019   Zoster Vaccines- Shingrix (1 of 2) 08/30/2021 (Originally 01/10/1970)   FOOT EXAM  05/31/2022 (Originally 03/12/2021)   HEMOGLOBIN A1C  11/28/2021   OPHTHALMOLOGY EXAM  07/08/2022   MAMMOGRAM  11/19/2022   TETANUS/TDAP  07/19/2025   COLONOSCOPY (Pts 45-63yr Insurance coverage will need to be confirmed)  08/18/2029   Pneumonia Vaccine 72 Years old  Completed   INFLUENZA VACCINE  Completed   DEXA SCAN  Completed   Hepatitis C Screening  Completed   HPV VACCINES  Aged Out    Health Maintenance  Health Maintenance Due  Topic Date Due   COVID-19 Vaccine (3 - PBlairsdenrisk series) 12/30/2019    Colorectal cancer screening: Type of screening: Colonoscopy. Completed 06/19/19. Repeat every 10 years  Mammogram status: Completed 11/18/20. Repeat every year  Bone Density status: Completed 08/13/19. Results reflect: Bone density results: OSTEOPENIA. Repeat every 2-3 years.  Lung Cancer Screening: (Low Dose CT Chest recommended if Age 72-80years, 30 pack-year currently  smoking OR have quit w/in 15years.) does not qualify.   Additional Screening:  Hepatitis C Screening: does qualify; Completed 08/15/12  Vision Screening: Recommended annual ophthalmology exams for early detection of glaucoma and other disorders of the eye. Is the patient up to date with their annual eye exam?  Yes  Who is the provider or what is the [facility]   Dental Screening: Recommended annual dental exams for proper oral hygiene  Community Resource Referral / Chronic Care Management: CRR required this visit?  Yes  CCM required this visit?  Yes     Plan:     I have personally reviewed and noted the following in the patients chart:   Medical  and social history Use of alcohol, tobacco or illicit drugs  Current medications and supplements including opioid prescriptions.  Functional ability and status Nutritional status Physical activity Advanced directives List of other physicians Hospitalizations, surgeries, and ER visits in previous 12 months Vitals Screenings to include cognitive, depression, and falls Referrals and appointments  In addition, I have reviewed and discussed with patient certain preventive protocols, quality metrics, and best practice recommendations. A written personalized care plan for preventive services as well as general preventive health recommendations were provided to patient.     Clemetine Marker, LPN   [id number]/[id number]   Nurse Notes: pt states she was prescribed farxiga by nephrology but unable to afford. Msg sent to [place] pharmacist for possible patient assistance. Pt currently enrolled with CCM services.

## 2021-08-30 NOTE — Patient Instructions (Signed)
Summer [name] , Thank you for taking time to come for your Medicare Wellness Visit. I appreciate your ongoing commitment to your health goals. Please review the following plan we discussed and let me know if I can assist you in the future.   Screening recommendations/referrals: Colonoscopy: done 08/19/19 Mammogram: done 11/18/20 Bone Density: done 08/13/19 Recommended yearly ophthalmology/optometry visit for glaucoma screening and checkup Recommended yearly dental visit for hygiene and checkup  Vaccinations: Influenza vaccine: done 05/31/21 Pneumococcal vaccine: done 06/16/14 Tdap vaccine: done 07/20/15 Shingles vaccine: Shingrix discussed. Please contact your pharmacy for coverage information.  Covid-19: done 11/07/19 & 12/02/19  Conditions/risks identified: Recommend increasing physical activity as tolerated  Next appointment: Follow up in one year for your annual wellness visit    Preventive Care 65 Years and Older, Female Preventive care refers to lifestyle choices and visits with your health care provider that can promote health and wellness. What does preventive care include? A yearly physical exam. This is also called an annual well check. Dental exams once or twice a year. Routine eye exams. Ask your health care provider how often you should have your eyes checked. Personal lifestyle choices, including: Daily care of your teeth and gums. Regular physical activity. Eating a healthy diet. Avoiding tobacco and drug use. Limiting alcohol use. Practicing safe sex. Taking low-dose aspirin every day. Taking vitamin and mineral supplements as recommended by your health care provider. What happens during an annual well check? The services and screenings done by your health care provider during your annual well check will depend on your age, overall health, lifestyle risk factors, and family history of disease. Counseling  Your health care provider may ask you questions about your: Alcohol  use. Tobacco use. Drug use. Emotional well-being. Home and relationship well-being. Sexual activity. Eating habits. History of falls. Memory and ability to understand (cognition). Work and work Statistician. Reproductive health. Screening  You may have the following tests or measurements: Height, weight, and BMI. Blood pressure. Lipid and cholesterol levels. These may be checked every 5 years, or more frequently if you are over 34 years old. Skin check. Lung cancer screening. You may have this screening every year starting at age 43 if you have a 30-pack-year history of smoking and currently smoke or have quit within the past 15 years. Fecal occult blood test (FOBT) of the stool. You may have this test every year starting at age 46. Flexible sigmoidoscopy or colonoscopy. You may have a sigmoidoscopy every 5 years or a colonoscopy every 10 years starting at age 56. Hepatitis C blood test. Hepatitis B blood test. Sexually transmitted disease (STD) testing. Diabetes screening. This is done by checking your blood sugar (glucose) after you have not eaten for a while (fasting). You may have this done every 1-3 years. Bone density scan. This is done to screen for osteoporosis. You may have this done starting at age 23. Mammogram. This may be done every 1-2 years. Talk to your health care provider about how often you should have regular mammograms. Talk with your health care provider about your test results, treatment options, and if necessary, the need for more tests. Vaccines  Your health care provider may recommend certain vaccines, such as: Influenza vaccine. This is recommended every year. Tetanus, diphtheria, and acellular pertussis (Tdap, Td) vaccine. You may need a Td booster every 10 years. Zoster vaccine. You may need this after age 34. Pneumococcal 13-valent conjugate (PCV13) vaccine. One dose is recommended after age 40. Pneumococcal polysaccharide (PPSV23) vaccine. One  dose is  recommended after age 62. Talk to your health care provider about which screenings and vaccines you need and how often you need them. This information is not intended to replace advice given to you by your health care provider. Make sure you discuss any questions you have with your health care provider. Document Released: 09/24/2015 Document Revised: 05/17/2016 Document Reviewed: 06/29/2015 Elsevier Interactive Patient Education  2017 [employer] Prevention in the Home Falls can cause injuries. They can happen to people of all ages. There are many things you can do to make your home safe and to help prevent falls. What can I do on the outside of my home? Regularly fix the edges of walkways and driveways and fix any cracks. Remove anything that might make you trip as you walk through a door, such as a raised step or threshold. Trim any bushes or trees on the path to your home. Use bright outdoor lighting. Clear any walking paths of anything that might make someone trip, such as rocks or tools. Regularly check to see if handrails are loose or broken. Make sure that both sides of any steps have handrails. Any raised decks and porches should have guardrails on the edges. Have any leaves, snow, or ice cleared regularly. Use sand or salt on walking paths during winter. Clean up any spills in your garage right away. This includes oil or grease spills. What can I do in the bathroom? Use night lights. Install grab bars by the toilet and in the tub and shower. Do not use towel bars as grab bars. Use non-skid mats or decals in the tub or shower. If you need to sit down in the shower, use a plastic, non-slip stool. Keep the floor dry. Clean up any water that spills on the floor as soon as it happens. Remove soap buildup in the tub or shower regularly. Attach bath mats securely with double-sided non-slip rug tape. Do not have throw rugs and other things on the floor that can make you  trip. What can I do in the bedroom? Use night lights. Make sure that you have a light by your bed that is easy to reach. Do not use any sheets or blankets that are too big for your bed. They should not hang down onto the floor. Have a firm chair that has side arms. You can use this for support while you get dressed. Do not have throw rugs and other things on the floor that can make you trip. What can I do in the kitchen? Clean up any spills right away. Avoid walking on wet floors. Keep items that you use a lot in easy-to-reach places. If you need to reach something above you, use a strong step stool that has a grab bar. Keep electrical cords out of the way. Do not use floor polish or wax that makes floors slippery. If you must use wax, use non-skid floor wax. Do not have throw rugs and other things on the floor that can make you trip. What can I do with my stairs? Do not leave any items on the stairs. Make sure that there are handrails on both sides of the stairs and use them. Fix handrails that are broken or loose. Make sure that handrails are as long as the stairways. Check any carpeting to make sure that it is firmly attached to the stairs. Fix any carpet that is loose or worn. Avoid having throw rugs at the top or bottom of the  stairs. If you do have throw rugs, attach them to the floor with carpet tape. Make sure that you have a light switch at the top of the stairs and the bottom of the stairs. If you do not have them, ask someone to add them for you. What else can I do to help prevent falls? Wear shoes that: Do not have high heels. Have rubber bottoms. Are comfortable and fit you well. Are closed at the toe. Do not wear sandals. If you use a stepladder: Make sure that it is fully opened. Do not climb a closed stepladder. Make sure that both sides of the stepladder are locked into place. Ask someone to hold it for you, if possible. Clearly mark and make sure that you can  see: Any grab bars or handrails. First and last steps. Where the edge of each step is. Use tools that help you move around (mobility aids) if they are needed. These include: Canes. Walkers. Scooters. Crutches. Turn on the lights when you go into a dark area. Replace any light bulbs as soon as they burn out. Set up your furniture so you have a clear path. Avoid moving your furniture around. If any of your floors are uneven, fix them. If there are any pets around you, be aware of where they are. Review your medicines with your doctor. Some medicines can make you feel dizzy. This can increase your chance of falling. Ask your doctor what other things that you can do to help prevent falls. This information is not intended to replace advice given to you by your health care provider. Make sure you discuss any questions you have with your health care provider. Document Released: 06/24/2009 Document Revised: 02/03/2016 Document Reviewed: 10/02/2014 Elsevier Interactive Patient Education  2017 [employer].

## 2021-08-31 DIAGNOSIS — E1169 Type 2 diabetes mellitus with other specified complication: Secondary | ICD-10-CM | POA: Diagnosis not present

## 2021-09-16 ENCOUNTER — Telehealth: Payer: Self-pay

## 2021-09-16 NOTE — Progress Notes (Signed)
Per Clinical pharmacist, please reschedule patient telephone appointment on 11/30/2021.  Left Voice message informing patient I reschedule her telephone follow up appointment to 01/04/2022 at 3:00 pm. Patient can return my call to reschedule if this time or day does not work for her at [phone].  Juda Pharmacist Assistant [phone]

## 2021-09-18 ENCOUNTER — Other Ambulatory Visit: Payer: Self-pay | Admitting: Family Medicine

## 2021-09-18 DIAGNOSIS — F331 Major depressive disorder, recurrent, moderate: Secondary | ICD-10-CM

## 2021-09-18 DIAGNOSIS — N1832 Chronic kidney disease, stage 3b: Secondary | ICD-10-CM

## 2021-09-18 DIAGNOSIS — G4733 Obstructive sleep apnea (adult) (pediatric): Secondary | ICD-10-CM

## 2021-09-18 DIAGNOSIS — I10 Essential (primary) hypertension: Secondary | ICD-10-CM

## 2021-09-18 NOTE — Telephone Encounter (Signed)
Requested medication (s) are due for refill today: yes  Requested medication (s) are on the active medication list: yes  Last refill:  05/31/21 #90  Future visit scheduled: yes  Notes to clinic:  med not assigned to a protocol   Requested Prescriptions  Pending Prescriptions Disp Refills   modafinil (PROVIGIL) 100 MG tablet [Pharmacy Med Name: Modafinil 100mg  Tablet] 90 tablet     Sig: Take 1 tablet by mouth daily.     Off-Protocol Failed - 09/18/2021  5:50 AM      Failed - Medication not assigned to a protocol, review manually.      Passed - Valid encounter within last 12 months    Recent Outpatient Visits           3 months ago Type 2 diabetes mellitus with diabetic polyneuropathy, with long-term current use of insulin [facility])   [facility] Steele Sizer, MD   5 months ago Upper respiratory tract infection, unspecified type   [facility] Delsa Grana, PA-C   7 months ago Atherosclerosis of native arteries of the extremities with ulceration [facility])   [facility] Steele Sizer, MD   11 months ago Dyslipidemia associated with type 2 diabetes mellitus [facility])   [facility] Steele Sizer, MD   1 year ago Cough   [place], MD       Future Appointments             In 2 weeks Steele Sizer, MD [place]   In 11 months  [facility], [facility]            Signed Prescriptions Disp Refills   furosemide (LASIX) 20 MG tablet 90 tablet 0    Sig: Take 1 tablet by mouth daily.     Cardiovascular:  Diuretics - Loop Failed - 09/18/2021  5:50 AM      Failed - Cr in normal range and within 360 days    Creat  Date Value Ref Range Status  01/28/2021 1.33 (H) 0.60 - 0.93 mg/dL Final    Comment:    For patients >43 years of age, the reference limit for Creatinine is approximately 13% higher for people identified as  African-American. .    Creatinine, Urine  Date Value Ref Range Status  01/28/2018 41 20 - 275 mg/dL Final          Passed - K in normal range and within 360 days    Potassium  Date Value Ref Range Status  01/28/2021 4.7 3.5 - 5.3 mmol/L Final          Passed - Ca in normal range and within 360 days    Calcium  Date Value Ref Range Status  01/28/2021 10.0 8.6 - 10.4 mg/dL Final          Passed - Na in normal range and within 360 days    Sodium  Date Value Ref Range Status  01/28/2021 140 135 - 146 mmol/L Final  09/24/2020 141 134 - 144 mmol/L Final          Passed - Last BP in normal range    BP Readings from Last 1 Encounters:  08/30/21 110/62          Passed - Valid encounter within last 6 months    Recent Outpatient Visits           3 months ago Type 2 diabetes  mellitus with diabetic polyneuropathy, with long-term current use of insulin [facility])   [facility] Steele Sizer, MD   5 months ago Upper respiratory tract infection, unspecified type   [facility] Delsa Grana, PA-C   7 months ago Atherosclerosis of native arteries of the extremities with ulceration [facility])   [facility] Steele Sizer, MD   11 months ago Dyslipidemia associated with type 2 diabetes mellitus [facility])   [facility] Steele Sizer, MD   1 year ago Cough   [place], MD       Future Appointments             In 2 weeks Steele Sizer, MD [place]   In 11 months  [facility]             metoprolol tartrate (LOPRESSOR) 25 MG tablet 180 tablet 0    Sig: Take 1 tablet by mouth twice daily.     Cardiovascular:  Beta Blockers Passed - 09/18/2021  5:50 AM      Passed - Last BP in normal range    BP Readings from Last 1 Encounters:  08/30/21 110/62          Passed - Last Heart Rate in normal range     Pulse Readings from Last 1 Encounters:  08/30/21 67          Passed - Valid encounter within last 6 months    Recent Outpatient Visits           3 months ago Type 2 diabetes mellitus with diabetic polyneuropathy, with long-term current use of insulin [facility])   [facility] Steele Sizer, MD   5 months ago Upper respiratory tract infection, unspecified type   [facility] Delsa Gr[facility], PA-C   7 months ago Atherosclerosis of native arteries of the extremities with ulceration [facility])   [facility] Steele Sizer, MD   11 months ago Dyslipidemia associated with type 2 diabetes mellitus [facility])   [facility] Steele Sizer, MD   1 year ago Cough   [place], MD       Future Appointments             In 2 weeks Steele Sizer, MD [facility], [place]   In 11 months  [facility], [facility]

## 2021-09-18 NOTE — Telephone Encounter (Signed)
Requested Prescriptions  Pending Prescriptions Disp Refills   furosemide (LASIX) 20 MG tablet [Pharmacy Med Name: Furosemide 20mg  Tablet] 90 tablet 0    Sig: Take 1 tablet by mouth daily.     Cardiovascular:  Diuretics - Loop Failed - 09/18/2021  5:50 AM      Failed - Cr in normal range and within 360 days    Creat  Date Value Ref Range Status  01/28/2021 1.33 (H) 0.60 - 0.93 mg/dL Final    Comment:    For patients >73 years of age, the reference limit for Creatinine is approximately 13% higher for people identified as African-American. .    Creatinine, Urine  Date Value Ref Range Status  01/28/2018 41 20 - 275 mg/dL Final         Passed - K in normal range and within 360 days    Potassium  Date Value Ref Range Status  01/28/2021 4.7 3.5 - 5.3 mmol/L Final         Passed - Ca in normal range and within 360 days    Calcium  Date Value Ref Range Status  01/28/2021 10.0 8.6 - 10.4 mg/dL Final         Passed - Na in normal range and within 360 days    Sodium  Date Value Ref Range Status  01/28/2021 140 135 - 146 mmol/L Final  09/24/2020 141 134 - 144 mmol/L Final         Passed - Last BP in normal range    BP Readings from Last 1 Encounters:  08/30/21 110/62         Passed - Valid encounter within last 6 months    Recent Outpatient Visits          3 months ago Type 2 diabetes mellitus with diabetic polyneuropathy, with long-term current use of insulin [facility])   [facility] Sizer, MD   5 months ago Upper respiratory tract infection, unspecified type   [facility] Delsa Gr[facility], PA-C   7 months ago Atherosclerosis of native arteries of the extremities with ulceration [facility])   [facility] Steele Sizer, MD   11 months ago Dyslipidemia associated with type 2 diabetes mellitus [facility])   [facility] Steele Sizer, MD   1 year ago Cough   [place], MD      Future Appointments            In 2 weeks Steele Sizer, MD [facility], [place]   In 11 months  [facility]            modafinil (PROVIGIL) 100 MG tablet [Pharmacy Med Name: Modafinil 100mg  Tablet] 90 tablet     Sig: Take 1 tablet by mouth daily.     Off-Protocol Failed - 09/18/2021  5:50 AM      Failed - Medication not assigned to a protocol, review manually.      Passed - Valid encounter within last 12 months    Recent Outpatient Visits          3 months ago Type 2 diabetes mellitus with diabetic polyneuropathy, with long-term current use of insulin [facility])   [facility] Steele Sizer, MD   5 months ago Upper respiratory tract infection, unspecified type   [facility] [place], Kristeen Miss, PA-C   7 months ago Atherosclerosis of native arteries of the extremities with  ulceration [facility])   [facility] [place], Drue Stager, MD   11 months ago Dyslipidemia associated with type 2 diabetes mellitus [facility])   [facility] Steele Sizer, MD   1 year ago Cough   [place], MD      Future Appointments            In 2 weeks Steele Sizer, MD [facility], [place]   In 11 months  [facility]            metoprolol tartrate (LOPRESSOR) 25 MG tablet [Pharmacy Med Name: Metoprolol Tartrate 25mg  Tablet] 180 tablet 0    Sig: Take 1 tablet by mouth twice daily.     Cardiovascular:  Beta Blockers Passed - 09/18/2021  5:50 AM      Passed - Last BP in normal range    BP Readings from Last 1 Encounters:  08/30/21 110/62         Passed - Last Heart Rate in normal range    Pulse Readings from Last 1 Encounters:  08/30/21 67         Passed - Valid encounter within last 6 months    Recent Outpatient Visits          3 months ago Type 2 diabetes mellitus with diabetic  polyneuropathy, with long-term current use of insulin [facility])   [facility] Sizer, MD   5 months ago Upper respiratory tract infection, unspecified type   [facility] Delsa Gr[facility], PA-C   7 months ago Atherosclerosis of native arteries of the extremities with ulceration [facility])   [facility] Steele Sizer, MD   11 months ago Dyslipidemia associated with type 2 diabetes mellitus [facility])   [facility] Steele Sizer, MD   1 year ago Cough   [place], MD      Future Appointments            In 2 weeks Steele Sizer, MD [facility], [place]   In 11 months  [facility], [facility]

## 2021-09-22 ENCOUNTER — Telehealth: Payer: Self-pay

## 2021-09-22 NOTE — Telephone Encounter (Signed)
° °  Telephone encounter was:  Successful.  09/22/2021 Name: Summer [name] MRN: [id number] DOB: [birth year]  Summer [name] is a 71 y.o. year old female who is a primary care patient of Steele Sizer, MD . The community resource team was consulted for assistance with [place] guide performed the following interventions: Spoke with patient about placing a [employer] referral to local food pantries for assistance. Patient consented to placing referral.  Follow Up Plan:  Care guide will follow up with patient by phone over the next 7-10 days.  Brittnie Lewey, AAS Paralegal, [place] [employer]  [address],  [address] ??[email or web address]   ?? [phone]   [email or web address]

## 2021-09-28 ENCOUNTER — Telehealth: Payer: Self-pay

## 2021-09-28 NOTE — Telephone Encounter (Signed)
° °  Telephone encounter was:  Successful.  09/28/2021 Name: TIANNE PLOTT MRN: [id number] DOB: 1950/10/06  [name] is a 71 y.o. year old female who is a primary care patient of Steele Sizer, MD . The community resource team was consulted for assistance with [place] guide performed the following interventions: Received message from Mirian Capuchin at [facility] via [place] patient did not need an emergency box so she is signed up for food distribution starting in February.   Follow Up Plan:  No further follow up planned at this time. The patient has been provided with needed resources.  Janessa Mickle, AAS Paralegal, [place] [employer]  [address], [place] [address] ??[email or web address]   ?? [phone]   [email or web address]

## 2021-09-30 DIAGNOSIS — E1169 Type 2 diabetes mellitus with other specified complication: Secondary | ICD-10-CM | POA: Diagnosis not present

## 2021-09-30 NOTE — Progress Notes (Signed)
Name: Summer [name]   MRN: [id number]    DOB: 1951/07/27   Date:10/03/2021       Progress Note  Subjective  Chief Complaint  Follow Up  HPI  Hypothyroidism: she states hair loss is stable, no dysphagia, no change in bowel movements. On levothyroxine 25 mg daily and one and two on Sundays , last level was at goal. We will recheck labs today   Morbid Obesity: BMI of 44, she was  trying to lose weight by avoiding sweets but not very compliant with diet over the holidays    MDD: she is currently taking Zoloft 150 mg and Wellbutrin, Phq 9 is slightly bette but not in remission, she refuses to go back to psychiatrist.She wants to continue current regiment   Right lumbar radiculitis: . She is already taking gabapentin for neuropathy, She has a history of DDD lumbar spine, seen by Dr. Lacinda Axon in the past and had procedures done. She also saw Dr. Garen Grams but no recent procedures. She states back pain 5/10 at this time and no radiculitis     Atherosclerosis of aorta  found on CT abdomen, she also had peripheral vascular craterization 04/25 and she has infrarenal obstruction. She is on Crestor 20 mg , Vascepa ( out of medication for a few weeks since insurance denied - requiring brand necessary now) . She cannot afford Repatha . Currently Zetia and Crestor to 40 mg    Diabetes : she is under the care of Endocrinologist, Dr. Honor Junes ,lats A1C was 7.8 %  September 2022.  She states she is a stress eater, she is trying to eat better She has dyslipidemia, obesity, CKI, and HTN associated with DM. She was not able to afford Farxiga added by nephrologist for CKI.   CKI stage III: GFR in the 40's range she also has microalbuminuria.  She is on ARB, bp is controlled, seeing nephrologist - Dr. Holley Raring. She has good urine output and no pruritis  Last GFR was 46 Oct 2022 . He gave her rx for [place]ade but unable to afford it   Dyslipidemia: had labs done 05/13/2020 HDL is slightly better, LDL still above goal at 117 ,  triglycerides was high , she stopped taking fish oil over the pat few weeks, she cannot afford Repatha. We will recheck labs   OSA: she continues to wake up feeling tired, we started Mondafinil this year and it has helped with energy , she is still not wearing CPAP machine, unable to tolerate it She is on 100 mg of Trazodone now. Explained the risk of respiratory suppression without wearing CPAP machine   HTN: bp at home is within normal limits, she denies chest pain or palpitation, she has SOB with activity but stable. She saw Dr. Saunders Revel for SOB, likely multifactorial.    Patient Active Problem List   Diagnosis Date Noted   Chronic obstructive pulmonary disease ([place]) 01/28/2021   Atherosclerosis of native arteries of the extremities with ulceration ([place]) 12/03/2020   Diabetes mellitus ([place]) 05/24/2020   B12 deficiency 05/24/2020   Vitamin D deficiency 05/24/2020   Metatarsalgia of left foot 01/15/2020   Major depressive disorder, recurrent episode, moderate ([place]) 04/04/2019   Left arm weakness 04/03/2019   (HFpEF) heart failure with preserved ejection fraction ([place]) 08/31/2017   Moderate persistent asthma 08/31/2017   Hyperlipidemia associated with type 2 diabetes mellitus ([place]) 06/14/2017   Dyslipidemia associated with type 2 diabetes mellitus ([place]) 08/18/2016   Charcot foot due to diabetes mellitus ([place])  02/22/2016   Acquired abduction deformity of foot 07/12/2015   Osteoarthritis of subtalar joint 07/12/2015   Poorly controlled type 2 diabetes mellitus with neuropathy ([place]) 07/12/2015   Arthritis of foot, degenerative 07/12/2015   Carpal tunnel syndrome 04/17/2015   Chronic constipation 04/17/2015   Insomnia, persistent 04/17/2015   Stage 3 chronic kidney disease ([place]) 04/17/2015   Decreased exercise tolerance 04/17/2015   Diabetes mellitus with polyneuropathy ([place]) 04/17/2015   Gastro-esophageal reflux disease without esophagitis 04/17/2015   Bursitis, trochanteric 04/17/2015    Cephalalgia 04/17/2015   Hypertension associated with diabetes ([place]) 04/17/2015   Adult hypothyroidism 04/17/2015   Hearing loss 04/17/2015   Chronic recurrent major depressive disorder ([place]) 04/17/2015   Neurogenic claudication ([place]) 04/17/2015   Class 3 severe obesity with serious comorbidity and body mass index (BMI) of 45.0 to 49.9 in adult ([place]) 04/17/2015   Hypo-ovarianism 04/17/2015   Perennial allergic rhinitis with seasonal variation 04/17/2015   Acne erythematosa 04/17/2015   Dyskinesia, tardive 04/17/2015   Memory loss 04/17/2015   Impingement syndrome of shoulder 04/17/2015   Dermatitis, stasis 04/17/2015   Obstructive sleep apnea 05/14/2014   Shortness of breath on exertion 05/06/2014   Mixed hyperlipidemia 02/06/2012   LBP (low back pain) 09/16/2008    Past Surgical History:  Procedure Laterality Date   ABDOMINAL HYSTERECTOMY  1975   ANKLE SURGERY Left approx Jan 2018   [place]   CATARACT EXTRACTION  01/2011   right   COLONOSCOPY WITH PROPOFOL N/A 08/19/2019   Procedure: COLONOSCOPY WITH PROPOFOL;  Surgeon: Jonathon Bellows, MD;  Location: [facility] ENDOSCOPY;  Service: Gastroenterology;  Laterality: N/A;   eye lid surgery  2013   bilateral   FOOT SURGERY     LOWER EXTREMITY ANGIOGRAPHY Left 11/23/2020   Procedure: LOWER EXTREMITY ANGIOGRAPHY;  Surgeon: Katha Cabal, MD;  Location: [facility];  Service: Cardiovascular;  Laterality: Left;   NECK SURGERY     SPINE SURGERY     TUBAL LIGATION     VAGINAL HYSTERECTOMY  1989    Family History  Problem Relation Age of Onset   Aneurysm Mother    Aortic aneurysm Mother    Hypertension Mother    Hyperlipidemia Mother    Heart disease Mother    Obesity Mother    Heart attack Maternal Grandfather    Diabetes Maternal Grandfather     Social History   Tobacco Use   Smoking status: Never   Smokeless tobacco: Never  Substance Use Topics   Alcohol use: No    Alcohol/week: 0.0 standard drinks      Current Outpatient Medications:    ACCU-CHEK SOFTCLIX LANCETS lancets, , Disp: , Rfl:    acetaminophen (TYLENOL 8 HOUR) 650 MG CR tablet, Take 1 tablet (650 mg total) by mouth every 8 (eight) hours as needed for pain., Disp: 90 tablet, Rfl: 2   Alcohol Swabs (B-D SINGLE USE SWABS REGULAR) PADS, , Disp: , Rfl:    amLODipine (NORVASC) 5 MG tablet, Take 1 tablet by mouth daily., Disp: 90 tablet, Rfl: 0   ASPIRIN LOW DOSE 81 MG EC tablet, TAKE 1 TABLET (81 MG TOTAL) BY MOUTH DAILY., Disp: 30 tablet, Rfl: 0   Blood Glucose Monitoring Suppl (ACCU-CHEK AVIVA PLUS) w/Device KIT, , Disp: , Rfl:    buPROPion (WELLBUTRIN XL) 150 MG 24 hr tablet, Take 1 tablet (150 mg total) by mouth daily., Disp: 90 tablet, Rfl: 1   Cholecalciferol (VITAMIN D3) 25 MCG (1000 UT) CAPS, Take 1,000  Units by mouth., Disp: , Rfl:    Cyanocobalamin (VITAMIN B12 PO), Take 1 tablet by mouth daily. , Disp: , Rfl:    dapagliflozin propanediol (FARXIGA) 10 MG TABS tablet, Take 1 tablet by mouth in the morning., Disp: , Rfl:    ezetimibe (ZETIA) 10 MG tablet, Take 1 tablet (10 mg total) by mouth daily., Disp: 90 tablet, Rfl: 1   furosemide (LASIX) 20 MG tablet, Take 1 tablet by mouth daily., Disp: 90 tablet, Rfl: 0   insulin NPH-regular Human (70-30) 100 UNIT/ML injection, Inject 100 Units into the skin daily with supper., Disp: , Rfl:    irbesartan-hydrochlorothiazide (AVALIDE) 300-12.5 MG tablet, Take 1 tablet by mouth daily., Disp: 90 tablet, Rfl: 0   levocetirizine (XYZAL) 5 MG tablet, Take 1 tablet (5 mg total) by mouth every evening., Disp: 90 tablet, Rfl: 1   levothyroxine (SYNTHROID) 25 MCG tablet, Take 1 tablet by mouth daily except, take 1 and 1/2 tablets by mouth on Sunday., Disp: 100 tablet, Rfl: 0   metFORMIN (GLUCOPHAGE) 500 MG tablet, Take 1,000 mg by mouth daily with breakfast., Disp: , Rfl:    metoprolol tartrate (LOPRESSOR) 25 MG tablet, Take 1 tablet by mouth twice daily., Disp: 180 tablet, Rfl: 0   Misc  Natural Products (OSTEO BI-FLEX ADV JOINT SHIELD PO), Take by mouth 2 (two) times daily. , Disp: , Rfl:    modafinil (PROVIGIL) 100 MG tablet, Take 1 tablet by mouth daily., Disp: 90 tablet, Rfl: 0   montelukast (SINGULAIR) 10 MG tablet, Take 1 tablet (10 mg total) by mouth at bedtime., Disp: 90 tablet, Rfl: 1   omeprazole (PRILOSEC) 40 MG capsule, Take 1 capsule (40 mg total) by mouth daily., Disp: 90 capsule, Rfl: 1   Potassium 99 MG TABS, Take by mouth daily at 6 (six) AM., Disp: , Rfl:    pregabalin (LYRICA) 300 MG capsule, Take 300 mg by mouth 2 (two) times a day., Disp: , Rfl:    Probiotic Product (PROBIOTIC-10 PO), Take by mouth., Disp: , Rfl:    RELION INSULIN SYRINGE 1ML/31G 31G X 5/16" 1 ML MISC, , Disp: , Rfl:    rosuvastatin (CRESTOR) 40 MG tablet, Take 1 tablet (40 mg total) by mouth daily., Disp: 90 tablet, Rfl: 1   sertraline (ZOLOFT) 100 MG tablet, Take 1.5 tablets (150 mg total) by mouth daily., Disp: 135 tablet, Rfl: 1   traZODone (DESYREL) 100 MG tablet, Take 1 tablet (100 mg total) by mouth at bedtime as needed for sleep., Disp: 90 tablet, Rfl: 1  Allergies  Allergen Reactions   Codeine Other (See Comments)    "TRIPPED OUT"  DIDN'T LIKE THE WAY IT FELT   Atorvastatin     muscle pain   Hydrocodone     itching   Tramadol    Latex Rash   Zolpidem Other (See Comments)    Sleep walk    I personally reviewed active problem list, medication list, allergies, family history, social history, health maintenance with the patient/caregiver today.   ROS  Constitutional: Negative for fever or weight change.  Respiratory: Negative for cough, stable  shortness of breath with activity .   Cardiovascular: Negative for chest pain or palpitations.  Gastrointestinal: Negative for abdominal pain, no bowel changes.  Musculoskeletal: positive for gait problem but no joint swelling.  Skin: Negative for rash.  Neurological: Negative for dizziness or headache.  No other specific  complaints in a complete review of systems (except as listed in HPI above).  Objective  Vitals:   10/03/21 1350  BP: 130/86  Pulse: 77  Resp: 16  Temp: 98.3 F (36.8 C)  SpO2: 99%  Weight: 244 lb (110.7 kg)  Height: 5\' 2"  (1.575 m)    Body mass index is 44.63 kg/m.  Physical Exam  Constitutional: Patient appears well-developed and well-nourished. Obese  No distress.  HEENT: head atraumatic, normocephalic, pupils equal and reactive to light, neck supple, throat within normal limits Cardiovascular: Normal rate, regular rhythm and normal heart sounds.  No murmur heard. No BLE edema. Pulmonary/Chest: Effort normal and breath sounds normal. No respiratory distress. Abdominal: Soft.  There is no tenderness. Psychiatric: Patient has a normal mood and affect. behavior is normal. Judgment and thought content normal.   Recent Results (from the past 2160 hour(s))  HM DIABETES EYE EXAM     Status: None   Collection Time: 07/08/21 12:00 AM  Result Value Ref Range   HM Diabetic Eye Exam No Retinopathy No Retinopathy    Comment: [place]     PHQ2/9: Depression screen [facility] 2/9 10/03/2021 08/30/2021 05/31/2021 03/23/2021 01/28/2021  Decreased Interest 2 3 3  0 3  Down, Depressed, Hopeless 2 3 3  0 1  PHQ - 2 Score 4 6 6  0 4  Altered sleeping 0 0 2 0 2  Tired, decreased energy 3 3 0 0 3  Change in appetite 0 0 1 0 3  Feeling bad or failure about yourself  2 1 0 0 3  Trouble concentrating 0 0 0 0 0  Moving slowly or fidgety/restless 0 1 0 0 0  Suicidal thoughts 0 0 0 0 0  PHQ-9 Score 9 11 9  0 15  Difficult doing work/chores - Not difficult at all Somewhat difficult Not difficult at all -  Some recent data might be hidden    phq 9 is positive   Fall Risk: Fall Risk  10/03/2021 08/30/2021 05/31/2021 03/23/2021 01/28/2021  Falls in the past year? 1 0 0 0 0  Comment - - - - -  Number falls in past yr: 1 0 - 0 0  Injury with Fall? 0 0 - 0 0  Risk Factor Category  - - - - -   Comment - - - - -  Risk for fall due to : Impaired balance/gait;Impaired mobility Impaired balance/gait - - -  Risk for fall due to: Comment - - - - -  Follow up Falls prevention discussed Falls prevention discussed Falls prevention discussed - -      Functional Status Survey: Is the patient deaf or have difficulty hearing?: No Does the patient have difficulty seeing, even when wearing glasses/contacts?: Yes Does the patient have difficulty concentrating, remembering, or making decisions?: No Does the patient have difficulty walking or climbing stairs?: Yes Does the patient have difficulty dressing or bathing?: No Does the patient have difficulty doing errands alone such as visiting a doctor's office or shopping?: No    Assessment & Plan  1. Type 2 diabetes mellitus with diabetic polyneuropathy, with long-term current use of insulin ([place])   2. Dyslipidemia associated with type 2 diabetes mellitus ([name])  - Lipid panel - Hepatic function panel - VASCEPA 1 g capsule; Take 2 capsules (2 g total) by mouth 2 (two) times daily.  Dispense: 120 capsule; Refill: 5 - irbesartan-hydrochlorothiazide (AVALIDE) 300-12.5 MG tablet; Take 1 tablet by mouth daily.  Dispense: 90 tablet; Refill: 1 - rosuvastatin (CRESTOR) 40 MG tablet; Take 1 tablet (40 mg total) by mouth daily.  Dispense: 90 tablet; Refill: 1  3. Stage 3b chronic kidney disease ([place])   4. Morbid obesity ([place])  Discussed with the patient the risk posed by an increased BMI. Discussed importance of portion control, calorie counting and at least 150 minutes of physical activity weekly. Avoid sweet beverages and drink more water. Eat at least 6 servings of fruit and vegetables daily    5. MDD (major depressive disorder), recurrent episode, moderate ([name])  - buPROPion (WELLBUTRIN XL) 150 MG 24 hr tablet; Take 1 tablet (150 mg total) by mouth daily.  Dispense: 90 tablet; Refill: 1 - sertraline (ZOLOFT) 100 MG tablet; Take 1.5 tablets  (150 mg total) by mouth daily.  Dispense: 135 tablet; Refill: 1  6. Atherosclerosis of native arteries of the extremities with ulceration ([name])  - VASCEPA 1 g capsule; Take 2 capsules (2 g total) by mouth 2 (two) times daily.  Dispense: 120 capsule; Refill: 5  7. Hyperlipidemia associated with type 2 diabetes mellitus ([place])   8. Hypertension associated with type 2 diabetes mellitus ([place])   9. Adult hypothyroidism  - TSH  10. Spinal stenosis of lumbar region with neurogenic claudication   11. Mild intermittent asthma in adult without complication  - montelukast (SINGULAIR) 10 MG tablet; Take 1 tablet (10 mg total) by mouth at bedtime.  Dispense: 90 tablet; Refill: 1  12. Obstructive sleep apnea   13. Gastroesophageal reflux disease without esophagitis  - omeprazole (PRILOSEC) 40 MG capsule; Take 1 capsule (40 mg total) by mouth daily.  Dispense: 90 capsule; Refill: 1  14. Insomnia due to other mental disorder  - traZODone (DESYREL) 100 MG tablet; Take 1 tablet (100 mg total) by mouth at bedtime as needed for sleep.  Dispense: 90 tablet; Refill: 1  15. Benign hypertension  - metoprolol tartrate (LOPRESSOR) 25 MG tablet; Take 1 tablet (25 mg total) by mouth 2 (two) times daily.  Dispense: 180 tablet; Refill: 0   16. SOB (shortness of breath) on exertion  - Ambulatory referral to Pulmonology  17. Need for shingles vaccine  - Zoster Vaccine Adjuvanted [facility]) injection; Inject 0.5 mLs into the muscle once for 1 dose.  Dispense: 0.5 mL; Refill: 1

## 2021-10-03 ENCOUNTER — Ambulatory Visit: Payer: Medicare HMO | Admitting: Family Medicine

## 2021-10-03 ENCOUNTER — Other Ambulatory Visit: Payer: Self-pay

## 2021-10-03 ENCOUNTER — Encounter: Payer: Self-pay | Admitting: Family Medicine

## 2021-10-03 VITALS — BP 130/86 | HR 77 | Temp 98.3°F | Resp 16 | Ht 62.0 in | Wt 244.0 lb

## 2021-10-03 DIAGNOSIS — Z23 Encounter for immunization: Secondary | ICD-10-CM

## 2021-10-03 DIAGNOSIS — E039 Hypothyroidism, unspecified: Secondary | ICD-10-CM

## 2021-10-03 DIAGNOSIS — Z794 Long term (current) use of insulin: Secondary | ICD-10-CM

## 2021-10-03 DIAGNOSIS — E785 Hyperlipidemia, unspecified: Secondary | ICD-10-CM | POA: Diagnosis not present

## 2021-10-03 DIAGNOSIS — K219 Gastro-esophageal reflux disease without esophagitis: Secondary | ICD-10-CM

## 2021-10-03 DIAGNOSIS — E1169 Type 2 diabetes mellitus with other specified complication: Secondary | ICD-10-CM

## 2021-10-03 DIAGNOSIS — M48062 Spinal stenosis, lumbar region with neurogenic claudication: Secondary | ICD-10-CM

## 2021-10-03 DIAGNOSIS — F331 Major depressive disorder, recurrent, moderate: Secondary | ICD-10-CM

## 2021-10-03 DIAGNOSIS — R0602 Shortness of breath: Secondary | ICD-10-CM

## 2021-10-03 DIAGNOSIS — E1142 Type 2 diabetes mellitus with diabetic polyneuropathy: Secondary | ICD-10-CM | POA: Diagnosis not present

## 2021-10-03 DIAGNOSIS — G4733 Obstructive sleep apnea (adult) (pediatric): Secondary | ICD-10-CM

## 2021-10-03 DIAGNOSIS — E1159 Type 2 diabetes mellitus with other circulatory complications: Secondary | ICD-10-CM | POA: Diagnosis not present

## 2021-10-03 DIAGNOSIS — F99 Mental disorder, not otherwise specified: Secondary | ICD-10-CM

## 2021-10-03 DIAGNOSIS — I152 Hypertension secondary to endocrine disorders: Secondary | ICD-10-CM

## 2021-10-03 DIAGNOSIS — F5105 Insomnia due to other mental disorder: Secondary | ICD-10-CM

## 2021-10-03 DIAGNOSIS — I7025 Atherosclerosis of native arteries of other extremities with ulceration: Secondary | ICD-10-CM

## 2021-10-03 DIAGNOSIS — J4520 Mild intermittent asthma, uncomplicated: Secondary | ICD-10-CM

## 2021-10-03 DIAGNOSIS — N1832 Chronic kidney disease, stage 3b: Secondary | ICD-10-CM

## 2021-10-03 DIAGNOSIS — I10 Essential (primary) hypertension: Secondary | ICD-10-CM

## 2021-10-03 LAB — LIPID PANEL
Cholesterol: 131 mg/dL (ref <200)
HDL: 38 mg/dL — ABNORMAL LOW (ref >=50)
LDL Cholesterol (Calc): 64 mg/dL (calc)
Non-HDL Cholesterol (Calc): 93 mg/dL (calc) (ref <130)
Total CHOL/HDL Ratio: 3.4 (calc) (ref <5.0)
Triglycerides: 217 mg/dL — ABNORMAL HIGH (ref <150)

## 2021-10-03 LAB — HEPATIC FUNCTION PANEL
AG Ratio: 1.9 (calc) (ref 1.0–2.5)
ALT: 12 U/L (ref 6–29)
AST: 16 U/L (ref 10–35)
Albumin: 4.4 g/dL (ref 3.6–5.1)
Alkaline phosphatase (APISO): 60 U/L (ref 37–153)
Bilirubin, Direct: 0.1 mg/dL (ref 0.0–0.2)
Globulin: 2.3 g/dL (calc) (ref 1.9–3.7)
Indirect Bilirubin: 0.3 mg/dL (calc) (ref 0.2–1.2)
Total Bilirubin: 0.4 mg/dL (ref 0.2–1.2)
Total Protein: 6.7 g/dL (ref 6.1–8.1)

## 2021-10-03 LAB — TSH: TSH: 1.43 mIU/L (ref 0.40–4.50)

## 2021-10-03 MED ORDER — ROSUVASTATIN CALCIUM 40 MG PO TABS: 40.0000 mg | ORAL_TABLET | Freq: Every day | ORAL | 1 refills | Status: DC

## 2021-10-03 MED ORDER — IRBESARTAN-HYDROCHLOROTHIAZIDE 300-12.5 MG PO TABS: 1.0000 | ORAL_TABLET | Freq: Every day | ORAL | 1 refills | Status: DC

## 2021-10-03 MED ORDER — SERTRALINE HCL 100 MG PO TABS: 150.0000 mg | ORAL_TABLET | Freq: Every day | ORAL | 1 refills | Status: DC

## 2021-10-03 MED ORDER — EZETIMIBE 10 MG PO TABS: 10.0000 mg | ORAL_TABLET | Freq: Every day | ORAL | 1 refills | Status: DC

## 2021-10-03 MED ORDER — TRAZODONE HCL 100 MG PO TABS: 100.0000 mg | ORAL_TABLET | Freq: Every evening | ORAL | 1 refills | Status: DC | PRN

## 2021-10-03 MED ORDER — BUPROPION HCL ER (XL) 150 MG PO TB24: 150.0000 mg | ORAL_TABLET | Freq: Every day | ORAL | 1 refills | Status: DC

## 2021-10-03 MED ORDER — SHINGRIX 50 MCG/0.5ML IM SUSR: 0.5000 mL | Freq: Once | INTRAMUSCULAR | 1 refills | Status: DC

## 2021-10-03 MED ORDER — VASCEPA 1 G PO CAPS: 2.0000 g | ORAL_CAPSULE | Freq: Two times a day (BID) | ORAL | 5 refills | Status: DC

## 2021-10-03 MED ORDER — METOPROLOL TARTRATE 25 MG PO TABS: 25.0000 mg | ORAL_TABLET | Freq: Two times a day (BID) | ORAL | 0 refills | Status: DC

## 2021-10-03 MED ORDER — AMLODIPINE BESYLATE 5 MG PO TABS: 5.0000 mg | ORAL_TABLET | Freq: Every day | ORAL | 1 refills | Status: DC

## 2021-10-03 MED ORDER — OMEPRAZOLE 40 MG PO CPDR: 40.0000 mg | DELAYED_RELEASE_CAPSULE | Freq: Every day | ORAL | 1 refills | Status: DC

## 2021-10-03 MED ORDER — MONTELUKAST SODIUM 10 MG PO TABS: 10.0000 mg | ORAL_TABLET | Freq: Every day | ORAL | 1 refills | Status: DC

## 2021-10-03 MED ORDER — SHINGRIX 50 MCG/0.5ML IM SUSR: 0.5000 mL | Freq: Once | INTRAMUSCULAR | 1 refills | Status: AC

## 2021-10-19 ENCOUNTER — Ambulatory Visit
Admission: RE | Admit: 2021-10-19 | Discharge: 2021-10-19 | Disposition: A | Discharge status: Home / Self Care | Payer: No Typology Code available for payment source | Attending: Internal Medicine | Admitting: Internal Medicine

## 2021-10-19 ENCOUNTER — Other Ambulatory Visit: Payer: Self-pay

## 2021-10-19 ENCOUNTER — Ambulatory Visit
Admission: RE | Admit: 2021-10-19 | Discharge: 2021-10-19 | Disposition: A | Discharge status: Home / Self Care | Payer: No Typology Code available for payment source | Source: Ambulatory Visit | Attending: Internal Medicine | Admitting: Internal Medicine

## 2021-10-19 ENCOUNTER — Encounter: Payer: Self-pay | Admitting: Internal Medicine

## 2021-10-19 ENCOUNTER — Ambulatory Visit (INDEPENDENT_AMBULATORY_CARE_PROVIDER_SITE_OTHER): Payer: No Typology Code available for payment source | Admitting: Internal Medicine

## 2021-10-19 VITALS — BP 118/74 | HR 76 | Temp 96.9°F | Ht 62.0 in | Wt 242.4 lb

## 2021-10-19 DIAGNOSIS — G4733 Obstructive sleep apnea (adult) (pediatric): Secondary | ICD-10-CM

## 2021-10-19 DIAGNOSIS — R0602 Shortness of breath: Secondary | ICD-10-CM | POA: Diagnosis not present

## 2021-10-19 DIAGNOSIS — J449 Chronic obstructive pulmonary disease, unspecified: Secondary | ICD-10-CM

## 2021-10-19 MED ORDER — BREZTRI AEROSPHERE 160-9-4.8 MCG/ACT IN AERO: 2.0000 | INHALATION_SPRAY | Freq: Two times a day (BID) | RESPIRATORY_TRACT | 0 refills | Status: DC

## 2021-10-19 MED ORDER — BREZTRI AEROSPHERE 160-9-4.8 MCG/ACT IN AERO: 2.0000 | INHALATION_SPRAY | Freq: Two times a day (BID) | RESPIRATORY_TRACT | 5 refills | Status: DC

## 2021-10-19 NOTE — Progress Notes (Signed)
[facility] Consultation        Date: 10/19/2021  MRN# [id number] Summer [name] 1951/03/08   Summer [name] is a 71 y.o. old female seen in consultation for chief complaint of:      **Spirometry 10/31/2018>> tracings personally reviewed.  FVC is 64% predicted, FEV1 is 60% predicted.  Ratio is 70%.  Expiratory time is 6.1 seconds.  Overall this test is consistent with moderate obstructive lung disease. **Echo 07/04/17; CZ=66%>> RV systolic function reported as "normal" **08/18/16 CBC>> Eos=438 **05/12/14; Echo>> EF= 55%; Pulm systolic pressure was normal.   CC Follow up COPD Follow up OSA  HPI 71 year old morbidly obese white female with persistent dyspnea on exertion Patient does have a history of COPD Moderate COPD based on previous pulm function testing   Patient of does have a history of sleep apnea but is intolerant of CPAP Patient has gained significant amount of weight over the last several years  Patient is currently not on any inhaler therapy  No exacerbation at this time No evidence of heart failure at this time No evidence or signs of infection at this time No respiratory distress No fevers, chills, nausea, vomiting, diarrhea No evidence of lower extremity edema No evidence hemoptysis  CXR 07/16/17>>Bibasilar atelectasis.   Medication:    Current Outpatient Medications:    ACCU-CHEK SOFTCLIX LANCETS lancets, , Disp: , Rfl:    acetaminophen (TYLENOL 8 HOUR) 650 MG CR tablet, Take 1 tablet (650 mg total) by mouth every 8 (eight) hours as needed for pain., Disp: 90 tablet, Rfl: 2   Alcohol Swabs (B-D SINGLE USE SWABS REGULAR) PADS, , Disp: , Rfl:    amLODipine (NORVASC) 5 MG tablet, Take 1 tablet (5 mg total) by mouth daily., Disp: 90 tablet, Rfl: 1   ASPIRIN LOW DOSE 81 MG EC tablet, TAKE 1 TABLET (81 MG TOTAL) BY MOUTH DAILY., Disp: 30 tablet, Rfl: 0   Blood Glucose Monitoring Suppl (ACCU-CHEK AVIVA PLUS) w/Device KIT, , Disp: , Rfl:    buPROPion  (WELLBUTRIN XL) 150 MG 24 hr tablet, Take 1 tablet (150 mg total) by mouth daily., Disp: 90 tablet, Rfl: 1   Cholecalciferol (VITAMIN D3) 25 MCG (1000 UT) CAPS, Take 1,000 Units by mouth., Disp: , Rfl:    Cyanocobalamin (VITAMIN B12 PO), Take 1 tablet by mouth daily. , Disp: , Rfl:    dapagliflozin propanediol (FARXIGA) 10 MG TABS tablet, Take 1 tablet by mouth in the morning., Disp: , Rfl:    ezetimibe (ZETIA) 10 MG tablet, Take 1 tablet (10 mg total) by mouth daily., Disp: 90 tablet, Rfl: 1   furosemide (LASIX) 20 MG tablet, Take 1 tablet by mouth daily., Disp: 90 tablet, Rfl: 0   insulin NPH-regular Human (70-30) 100 UNIT/ML injection, Inject 100 Units into the skin daily with supper., Disp: , Rfl:    irbesartan-hydrochlorothiazide (AVALIDE) 300-12.5 MG tablet, Take 1 tablet by mouth daily., Disp: 90 tablet, Rfl: 1   levocetirizine (XYZAL) 5 MG tablet, Take 1 tablet (5 mg total) by mouth every evening., Disp: 90 tablet, Rfl: 1   levothyroxine (SYNTHROID) 25 MCG tablet, Take 1 tablet by mouth daily except, take 1 and 1/2 tablets by mouth on Sunday., Disp: 100 tablet, Rfl: 0   metFORMIN (GLUCOPHAGE) 500 MG tablet, Take 1,000 mg by mouth daily with breakfast., Disp: , Rfl:    metoprolol tartrate (LOPRESSOR) 25 MG tablet, Take 1 tablet (25 mg total) by mouth 2 (two) times daily., Disp: 180 tablet, Rfl: 0  Misc Natural Products (OSTEO BI-FLEX ADV JOINT SHIELD PO), Take by mouth 2 (two) times daily. , Disp: , Rfl:    modafinil (PROVIGIL) 100 MG tablet, Take 1 tablet by mouth daily., Disp: 90 tablet, Rfl: 0   montelukast (SINGULAIR) 10 MG tablet, Take 1 tablet (10 mg total) by mouth at bedtime., Disp: 90 tablet, Rfl: 1   omeprazole (PRILOSEC) 40 MG capsule, Take 1 capsule (40 mg total) by mouth daily., Disp: 90 capsule, Rfl: 1   Potassium 99 MG TABS, Take by mouth daily at 6 (six) AM., Disp: , Rfl:    pregabalin (LYRICA) 300 MG capsule, Take 300 mg by mouth 2 (two) times a day., Disp: , Rfl:     Probiotic Product (PROBIOTIC-10 PO), Take by mouth., Disp: , Rfl:    RELION INSULIN SYRINGE 1ML/31G 31G X 5/16" 1 ML MISC, , Disp: , Rfl:    rosuvastatin (CRESTOR) 40 MG tablet, Take 1 tablet (40 mg total) by mouth daily., Disp: 90 tablet, Rfl: 1   sertraline (ZOLOFT) 100 MG tablet, Take 1.5 tablets (150 mg total) by mouth daily., Disp: 135 tablet, Rfl: 1   traZODone (DESYREL) 100 MG tablet, Take 1 tablet (100 mg total) by mouth at bedtime as needed for sleep., Disp: 90 tablet, Rfl: 1   VASCEPA 1 g capsule, Take 2 capsules (2 g total) by mouth 2 (two) times daily., Disp: 120 capsule, Rfl: 5   Allergies:  Codeine, Atorvastatin, Hydrocodone, Tramadol, Latex, and Zolpidem   Review of Systems:  Gen:  Denies  fever, sweats, chills weight loss  HEENT: Denies blurred vision, double vision, ear pain, eye pain, hearing loss, nose bleeds, sore throat Cardiac:  No dizziness, chest pain or heaviness, chest tightness,edema, No JVD Resp:   + cough, +sputum production, +shortness of breath,-wheezing, -hemoptysis,  Other:  All other systems negative  BP 118/74 (BP Location: Left Arm, Patient Position: Sitting, Cuff Size: Normal)    Pulse 76    Temp (!) 96.9 F (36.1 C) (Oral)    Ht _0  (1.575 m)    Wt 242 lb 6.4 oz (110 kg)    SpO2 98%    BMI 44.34 kg/m    Physical Examination:   GENERAL:NAD, no fevers, chills, no weakness no fatigue HEAD: Normocephalic, atraumatic.  EYES: PERLA, EOMI No scleral icterus.  PULMONARY: CTA B/L no wheezing, rhonchi, crackles CARDIOVASCULAR: S1 and S2. Regular rate and rhythm. 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, patient has diagnosis of sleep apnea Patient is morbidly obese with significant weight gain over the last several years Findings  suggest uncontrolled COPD due to morbid obesity with noncompliance of sleep apnea   Dyspnea and exertion with restrictive lung disease most likely due to her morbid obesity along with multifactorial etiologies of COPD and deconditioned state  COPD moderate Gold stage C Recommend starting breast tree inhaler   GERD Continue PPI as prescribed  OSA Patient is intolerant of CPAP  Obesity -recommend significant weight loss -recommend changing diet  Deconditioned state -Recommend increased daily activity and exercise   MEDICATION ADJUSTMENTS/LABS AND TESTS ORDERED: Albuterol as needed Recommend weight loss Start BREZTRI inhaler   CURRENT MEDICATIONS REVIEWED AT LENGTH WITH PATIENT TODAY   Patient satisfied with Plan of  action and management. All questions answered  Follow-up in 6 months  Total Time Spent 32 mins   Calais Svehla Patricia Pesa, M.D.  [facility]  Medical Director [place] Director [facility] Cardio-Pulmonary Department

## 2021-10-19 NOTE — Patient Instructions (Addendum)
Start Breztri inhaler Recommend weight loss Obtain CXR

## 2021-10-24 ENCOUNTER — Telehealth: Payer: Self-pay

## 2021-10-24 ENCOUNTER — Telehealth: Payer: Self-pay | Admitting: Internal Medicine

## 2021-10-24 DIAGNOSIS — J449 Chronic obstructive pulmonary disease, unspecified: Secondary | ICD-10-CM

## 2021-10-24 MED ORDER — BREZTRI AEROSPHERE 160-9-4.8 MCG/ACT IN AERO: 2.0000 | INHALATION_SPRAY | Freq: Two times a day (BID) | RESPIRATORY_TRACT | 5 refills | Status: DC

## 2021-10-24 NOTE — Progress Notes (Signed)
Chronic Care Management Pharmacy Assistant   Name: Summer [name]  MRN: [id number] DOB: 1951/06/30  Reason for Encounter:Diabetes Disease State Call.   Recent office visits:  10/03/2021 Dr. Ancil Boozer MD (PCP) start Vascepa 1 g 2 casule 2 times daily, Ambulatory referral to Pulmonology, Return in about 6 months  08/30/2021 Irish Elders LPN (PCP Office) Medicare Wellness Completed  Recent consult visits:  10/19/2021 Dr. Mortimer Fries MD (Pulmonology) start [facility] visits:  None in previous 6 months  Medications: Outpatient Encounter Medications as of 10/24/2021  Medication Sig Note   ACCU-CHEK SOFTCLIX LANCETS lancets     acetaminophen (TYLENOL 8 HOUR) 650 MG CR tablet Take 1 tablet (650 mg total) by mouth every 8 (eight) hours as needed for pain.    Alcohol Swabs (B-D SINGLE USE SWABS REGULAR) PADS     amLODipine (NORVASC) 5 MG tablet Take 1 tablet (5 mg total) by mouth daily.    ASPIRIN LOW DOSE 81 MG EC tablet TAKE 1 TABLET (81 MG TOTAL) BY MOUTH DAILY.    Blood Glucose Monitoring Suppl (ACCU-CHEK AVIVA PLUS) w/Device KIT     Budeson-Glycopyrrol-Formoterol (BREZTRI AEROSPHERE) 160-9-4.8 MCG/ACT AERO Inhale 2 puffs into the lungs in the morning and at bedtime.    Budeson-Glycopyrrol-Formoterol (BREZTRI AEROSPHERE) 160-9-4.8 MCG/ACT AERO Inhale 2 puffs into the lungs in the morning and at bedtime.    buPROPion (WELLBUTRIN XL) 150 MG 24 hr tablet Take 1 tablet (150 mg total) by mouth daily.    Cholecalciferol (VITAMIN D3) 25 MCG (1000 UT) CAPS Take 1,000 Units by mouth.    Cyanocobalamin (VITAMIN B12 PO) Take 1 tablet by mouth daily.     dapagliflozin propanediol (FARXIGA) 10 MG TABS tablet Take 1 tablet by mouth in the morning. 08/30/2021: Too expensive    ezetimibe (ZETIA) 10 MG tablet Take 1 tablet (10 mg total) by mouth daily.    furosemide (LASIX) 20 MG tablet Take 1 tablet by mouth daily.    insulin NPH-regular Human (70-30) 100 UNIT/ML injection Inject 100 Units into the  skin daily with supper. 08/26/2020: Pt only taking once daily at night    irbesartan-hydrochlorothiazide (AVALIDE) 300-12.5 MG tablet Take 1 tablet by mouth daily.    levocetirizine (XYZAL) 5 MG tablet Take 1 tablet (5 mg total) by mouth every evening.    levothyroxine (SYNTHROID) 25 MCG tablet Take 1 tablet by mouth daily except, take 1 and 1/2 tablets by mouth on Sunday.    metFORMIN (GLUCOPHAGE) 500 MG tablet Take 1,000 mg by mouth daily with breakfast.    metoprolol tartrate (LOPRESSOR) 25 MG tablet Take 1 tablet (25 mg total) by mouth 2 (two) times daily.    Misc Natural Products (OSTEO BI-FLEX ADV JOINT SHIELD PO) Take by mouth 2 (two) times daily.     modafinil (PROVIGIL) 100 MG tablet Take 1 tablet by mouth daily.    montelukast (SINGULAIR) 10 MG tablet Take 1 tablet (10 mg total) by mouth at bedtime.    omeprazole (PRILOSEC) 40 MG capsule Take 1 capsule (40 mg total) by mouth daily.    Potassium 99 MG TABS Take by mouth daily at 6 (six) AM.    pregabalin (LYRICA) 300 MG capsule Take 300 mg by mouth 2 (two) times a day.    Probiotic Product (PROBIOTIC-10 PO) Take by mouth.    RELION INSULIN SYRINGE 1ML/31G 31G X 5/16" 1 ML MISC     rosuvastatin (CRESTOR) 40 MG tablet Take 1 tablet (40 mg total) by  mouth daily.    sertraline (ZOLOFT) 100 MG tablet Take 1.5 tablets (150 mg total) by mouth daily.    traZODone (DESYREL) 100 MG tablet Take 1 tablet (100 mg total) by mouth at bedtime as needed for sleep.    VASCEPA 1 g capsule Take 2 capsules (2 g total) by mouth 2 (two) times daily.    No facility-administered encounter medications on file as of 10/24/2021.    Care Gaps: Shingrix Vaccine Star Rating Drugs: Irbesartan-HTCZ 300-12.5 mg last filled in 10/18/2021 for 30 day supply at [facility]. Metformin 500 mg last filled in 10/18/2021 for 30 day supply at [facility]. Rosuvastatin 20 mg last filled in 10/18/2021 for 30 day supply at [facility].   Medication Fill  Gaps: None  Recent Relevant Labs: Lab Results  Component Value Date/Time   HGBA1C 7.8 (A) 05/31/2021 01:49 PM   HGBA1C 7.1 10/07/2020 12:00 AM   HGBA1C 6.7 (H) 05/13/2020 11:46 AM   HGBA1C 6.7 (A) 03/12/2020 11:37 AM   HGBA1C 7.2 (H) 04/02/2019 05:43 PM   HGBA1C 8.1 08/27/2018 02:56 PM   HGBA1C 8.1 (A) 08/27/2018 02:56 PM   HGBA1C 8.1 (A) 08/27/2018 02:56 PM   MICROALBUR 0.6 01/28/2018 03:52 PM   MICROALBUR 20 02/19/2017 10:40 AM   MICROALBUR 20 07/22/2015 02:05 PM    Kidney Function Lab Results  Component Value Date/Time   CREATININE 1.33 (H) 01/28/2021 03:00 PM   CREATININE 1.21 (H) 11/23/2020 09:37 AM   CREATININE 1.50 (H) 09/24/2020 01:03 PM   CREATININE 1.42 (H) 10/14/2019 11:42 AM   GFRNONAA 40 (L) 01/28/2021 03:00 PM   GFRAA 46 (L) 01/28/2021 03:00 PM    Current antihyperglycemic regimen:  NPH 70/30 100 units-patient states she takes 50 units in morning and 50 units at supper Metformin 500 mg daily   What recent interventions/DTPs have been made to improve glycemic control:  None ID   Have there been any recent hospitalizations or ED visits since last visit with CPP? No  Patient denies hypoglycemic symptoms, including Pale, Sweaty, Shaky, Hungry, Nervous/irritable, and Vision changes  Patient reports hyperglycemic symptoms, including fatigue Patient states she does have symptoms of fatigue once in awhile. How often are you checking your blood sugar?  Patient states she has a monitor that checks her sugar levels through out the day. What are your blood sugars ranging?  Fasting: patient states her blood sugar was 60 (without any hypoglycemia symptoms) on 10/24/21 before having a meal and then it went to 111 soon after. Patient states she is unsure what her blood sugar is ranging.  During the week, how often does your blood glucose drop below 70? Once a week  Are you checking your feet daily/regularly?   Patient reports her feet does have symptoms of  numbness.  Adherence Review: Is the patient currently on a STATIN medication? Yes Is the patient currently on ACE/ARB medication? Yes Does the patient have >5 day gap between last estimated fill dates? No  Telephone follow up appointment with care management team member scheduled for:  01/04/2022 at 3:00 PM  [place] Pharmacist Assistant [phone]

## 2021-10-24 NOTE — Telephone Encounter (Signed)
Spoke to [place] with [employer] and clarified that breztri should be 1 inhaler per 1 month.  She voiced her understanding and had no further questions.  Nothing further needed.

## 2021-11-01 ENCOUNTER — Other Ambulatory Visit: Payer: Self-pay | Admitting: Family Medicine

## 2021-11-01 DIAGNOSIS — Z1231 Encounter for screening mammogram for malignant neoplasm of breast: Secondary | ICD-10-CM

## 2021-11-14 ENCOUNTER — Other Ambulatory Visit: Payer: Self-pay

## 2021-11-14 DIAGNOSIS — E1169 Type 2 diabetes mellitus with other specified complication: Secondary | ICD-10-CM

## 2021-11-14 MED ORDER — ROSUVASTATIN CALCIUM 40 MG PO TABS: 40.0000 mg | ORAL_TABLET | Freq: Every day | ORAL | 1 refills | Status: DC

## 2021-11-14 MED ORDER — IRBESARTAN-HYDROCHLOROTHIAZIDE 300-12.5 MG PO TABS: 1.0000 | ORAL_TABLET | Freq: Every day | ORAL | 1 refills | Status: DC

## 2021-11-17 ENCOUNTER — Other Ambulatory Visit: Payer: Self-pay | Admitting: Family Medicine

## 2021-11-17 DIAGNOSIS — E039 Hypothyroidism, unspecified: Secondary | ICD-10-CM

## 2021-11-30 ENCOUNTER — Telehealth: Payer: Medicare PPO

## 2021-12-05 ENCOUNTER — Ambulatory Visit (INDEPENDENT_AMBULATORY_CARE_PROVIDER_SITE_OTHER): Payer: No Typology Code available for payment source

## 2021-12-05 ENCOUNTER — Other Ambulatory Visit: Payer: Self-pay

## 2021-12-05 ENCOUNTER — Ambulatory Visit (INDEPENDENT_AMBULATORY_CARE_PROVIDER_SITE_OTHER): Payer: Medicare HMO | Admitting: Vascular Surgery

## 2021-12-05 DIAGNOSIS — I7025 Atherosclerosis of native arteries of other extremities with ulceration: Secondary | ICD-10-CM | POA: Diagnosis not present

## 2021-12-06 DIAGNOSIS — N1831 Chronic kidney disease, stage 3a: Secondary | ICD-10-CM | POA: Diagnosis not present

## 2021-12-06 DIAGNOSIS — R809 Proteinuria, unspecified: Secondary | ICD-10-CM | POA: Diagnosis not present

## 2021-12-06 DIAGNOSIS — E1122 Type 2 diabetes mellitus with diabetic chronic kidney disease: Secondary | ICD-10-CM | POA: Diagnosis not present

## 2021-12-06 DIAGNOSIS — D631 Anemia in chronic kidney disease: Secondary | ICD-10-CM | POA: Diagnosis not present

## 2021-12-06 DIAGNOSIS — I10 Essential (primary) hypertension: Secondary | ICD-10-CM | POA: Diagnosis not present

## 2021-12-06 DIAGNOSIS — N1832 Chronic kidney disease, stage 3b: Secondary | ICD-10-CM | POA: Diagnosis not present

## 2021-12-12 ENCOUNTER — Other Ambulatory Visit: Payer: Self-pay

## 2021-12-12 ENCOUNTER — Encounter (INDEPENDENT_AMBULATORY_CARE_PROVIDER_SITE_OTHER): Payer: Self-pay | Admitting: *Deleted

## 2021-12-12 DIAGNOSIS — E1169 Type 2 diabetes mellitus with other specified complication: Secondary | ICD-10-CM

## 2021-12-12 MED ORDER — IRBESARTAN-HYDROCHLOROTHIAZIDE 300-12.5 MG PO TABS: 1.0000 | ORAL_TABLET | Freq: Every day | ORAL | 1 refills | Status: DC

## 2021-12-12 MED ORDER — ROSUVASTATIN CALCIUM 40 MG PO TABS: 40.0000 mg | ORAL_TABLET | Freq: Every day | ORAL | 1 refills | Status: DC

## 2021-12-15 ENCOUNTER — Other Ambulatory Visit: Payer: Self-pay

## 2021-12-15 DIAGNOSIS — E785 Hyperlipidemia, unspecified: Secondary | ICD-10-CM

## 2021-12-21 ENCOUNTER — Other Ambulatory Visit: Payer: Self-pay | Admitting: Family Medicine

## 2021-12-21 DIAGNOSIS — N1832 Chronic kidney disease, stage 3b: Secondary | ICD-10-CM

## 2021-12-29 DIAGNOSIS — E1142 Type 2 diabetes mellitus with diabetic polyneuropathy: Secondary | ICD-10-CM | POA: Diagnosis not present

## 2021-12-29 DIAGNOSIS — E1169 Type 2 diabetes mellitus with other specified complication: Secondary | ICD-10-CM | POA: Diagnosis not present

## 2021-12-29 DIAGNOSIS — I152 Hypertension secondary to endocrine disorders: Secondary | ICD-10-CM | POA: Diagnosis not present

## 2021-12-29 DIAGNOSIS — E039 Hypothyroidism, unspecified: Secondary | ICD-10-CM | POA: Diagnosis not present

## 2021-12-29 DIAGNOSIS — Z794 Long term (current) use of insulin: Secondary | ICD-10-CM | POA: Diagnosis not present

## 2021-12-29 DIAGNOSIS — E1159 Type 2 diabetes mellitus with other circulatory complications: Secondary | ICD-10-CM | POA: Diagnosis not present

## 2021-12-29 DIAGNOSIS — E785 Hyperlipidemia, unspecified: Secondary | ICD-10-CM | POA: Diagnosis not present

## 2021-12-29 LAB — HEMOGLOBIN A1C: Hemoglobin A1C: 7.6

## 2022-01-03 ENCOUNTER — Telehealth: Payer: Self-pay

## 2022-01-03 NOTE — Progress Notes (Signed)
Chronic Care Management APPOINTMENT REMINDER ? ? ?Called Summer [name], No answer, left message of appointment on 01/03/2022 at 3:00 pm via telephone visit with Junius Argyle , Pharm D. Notified to have all medications, supplements, blood pressure and/or blood sugar logs available during appointment and to return call if need to reschedule. ? ?Anderson [place] ?Clinical Pharmacist Assistant ?[phone]  ? ?

## 2022-01-04 ENCOUNTER — Ambulatory Visit (INDEPENDENT_AMBULATORY_CARE_PROVIDER_SITE_OTHER): Payer: Medicare PPO

## 2022-01-04 DIAGNOSIS — E1169 Type 2 diabetes mellitus with other specified complication: Secondary | ICD-10-CM

## 2022-01-04 NOTE — Progress Notes (Signed)
? ?Chronic Care Management ?Pharmacy Note ? ?01/05/2022 ?Name:  Summer [name] MRN:  [id number] DOB:  1950/12/22 ? ?Summary: ?Patient presents for CCM follow-up. She had some instances of hypoglycemia due to mistiming insulin administration.  ? ?Recommendations/Changes made from today's visit: ?START PAP For Wilder Glade  ? ?Plan: ?CPP follow-up in 6 months ? ?Subjective: ?Summer [name] is an 71 y.o. year old female who is a primary patient of Steele Sizer, MD.  The CCM team was consulted for assistance with disease management and care coordination needs.   ? ?Engaged with patient by telephone for follow up visit in response to provider referral for pharmacy case management and/or care coordination services.  ? ?Consent to Services:  ?The patient was given information about Chronic Care Management services, agreed to services, and gave verbal consent prior to initiation of services.  Please see initial visit note for detailed documentation.  ? ?Patient Care Team: ?Steele Sizer, MD as PCP - General (Family Medicine) ?[name], MD as Consulting Physician (Cardiology) ?Lonia Farber, MD as Consulting Physician (Endocrinology) ?Sharlet Salina, MD as Referring Physician (Physical Medicine and Rehabilitation) ?Germaine Pomfret, [facility] (Pharmacist) ?Anthonette Legato, MD (Nephrology) ?Edrick Kins, DPM as Consulting Physician (Podiatry) ?Schnier, Dolores Lory, MD (Vascular Surgery) ? ?Recent office visits: ?10/03/21: Patient presented to Dr. Ancil Boozer  ?08/30/21: Patient presented to Clemetine Marker, LPN  ? ?Recent consult visits: ?12/29/21: Patient presented to Dr. Honor Junes (endocrinology)  ?12/06/21: Patient presented to Dr. Holley Raring (nephology)  ?10/19/21: Patient presented to Dr. Mortimer Fries  ? ?Hospital visits: ?None in previous 6 months ? ? ?Objective: ? ?Lab Results  ?Component Value Date  ? CREATININE 1.33 (H) 01/28/2021  ? BUN 50 (H) 01/28/2021  ? GFRNONAA 40 (L) 01/28/2021  ? GFRAA 46 (L) 01/28/2021  ? NA 140 01/28/2021  ?  K 4.7 01/28/2021  ? CALCIUM 10.0 01/28/2021  ? CO2 27 01/28/2021  ? GLUCOSE 126 (H) 01/28/2021  ? ? ?Lab Results  ?Component Value Date/Time  ? HGBA1C 7.8 (A) 05/31/2021 01:49 PM  ? HGBA1C 7.1 10/07/2020 12:00 AM  ? HGBA1C 6.7 (H) 05/13/2020 11:46 AM  ? HGBA1C 6.7 (A) 03/12/2020 11:37 AM  ? HGBA1C 7.2 (H) 04/02/2019 05:43 PM  ? HGBA1C 8.1 08/27/2018 02:56 PM  ? HGBA1C 8.1 (A) 08/27/2018 02:56 PM  ? HGBA1C 8.1 (A) 08/27/2018 02:56 PM  ? MICROALBUR 0.6 01/28/2018 03:52 PM  ? MICROALBUR 20 02/19/2017 10:40 AM  ? MICROALBUR 20 07/22/2015 02:05 PM  ?  ?Last diabetic Eye exam:  ?Lab Results  ?Component Value Date/Time  ? HMDIABEYEEXA No Retinopathy 07/08/2021 12:00 AM  ?  ?Last diabetic Foot exam:  ?Lab Results  ?Component Value Date/Time  ? HMDIABFOOTEX Dr. Honor Junes 12/19/2018 12:00 AM  ?  ? ?Lab Results  ?Component Value Date  ? CHOL 131 10/03/2021  ? HDL 38 (L) 10/03/2021  ? [place] 64 10/03/2021  ? TRIG 217 (H) 10/03/2021  ? CHOLHDL 3.4 10/03/2021  ? ? ? ?  Latest Ref Rng & Units 10/03/2021  ?  2:39 PM 01/28/2021  ?  3:00 PM 09/24/2020  ?  1:03 PM  ?Hepatic Function  ?Total Protein 6.1 - 8.1 g/dL 6.7   6.5   6.9    ?Albumin 3.7 - 4.7 g/dL   4.4    ?AST 10 - 35 U/L $Remo'16   18   15    'KIpOV$ ?ALT 6 - 29 U/L $Remo'12   21   14    'NUYGq$ ?Alk Phosphatase 44 - 121 IU/L  100    ?Total Bilirubin 0.2 - 1.2 mg/dL 0.4   0.4   0.3    ?Bilirubin, Direct 0.0 - 0.2 mg/dL 0.1      ? ? ?Lab Results  ?Component Value Date/Time  ? TSH 1.43 10/03/2021 02:39 PM  ? TSH 1.71 01/28/2021 03:00 PM  ? ? ? ?  Latest Ref Rng & Units 05/13/2020  ? 11:46 AM 10/14/2019  ? 11:42 AM 04/02/2019  ?  5:43 PM  ?CBC  ?WBC 3.4 - 10.8 x10E3/uL 8.0   8.6   9.2    ?Hemoglobin 11.1 - 15.9 g/dL 11.1   11.4   11.8    ?Hematocrit 34.0 - 46.6 % 33.7   33.7   35.3    ?Platelets 150 - 450 x10E3/uL 153   238   254    ? ? ?Lab Results  ?Component Value Date/Time  ? VD25OH 41.1 05/13/2020 11:46 AM  ? VD25OH 37 05/01/2018 02:18 PM  ? ? ?Clinical ASCVD: Yes  ?The 10-year ASCVD risk score (Arnett DK,  et al., 2019) is: 25.2% ?  Values used to calculate the score: ?    Age: 23 years ?    Sex: Female ?    Is Non-Hispanic African American: No ?    Diabetic: Yes ?    Tobacco smoker: No ?    Systolic Blood Pressure: 818 mmHg ?    Is BP treated: Yes ?    HDL Cholesterol: 38 mg/dL ?    Total Cholesterol: 131 mg/dL   ? ? ?  10/03/2021  ?  1:45 PM 08/30/2021  ?  3:52 PM 05/31/2021  ?  9:50 AM  ?Depression screen PHQ 2/9  ?Decreased Interest 2 3 3   ?Down, Depressed, Hopeless 2 3 3   ?PHQ - 2 Score 4 6 6   ?Altered sleeping 0 0 2  ?Tired, decreased energy 3 3 0  ?Change in appetite 0 0 1  ?Feeling bad or failure about yourself  2 1 0  ?Trouble concentrating 0 0 0  ?Moving slowly or fidgety/restless 0 1 0  ?Suicidal thoughts 0 0 0  ?PHQ-9 Score 9 11 9   ?Difficult doing work/chores  Not difficult at all Somewhat difficult  ?  ?Social History  ? ?Tobacco Use  ?Smoking Status Never  ?Smokeless Tobacco Never  ? ?BP Readings from Last 3 Encounters:  ?10/19/21 118/74  ?10/03/21 130/86  ?08/30/21 110/62  ? ?Pulse Readings from Last 3 Encounters:  ?10/19/21 76  ?10/03/21 77  ?08/30/21 67  ? ?Wt Readings from Last 3 Encounters:  ?10/19/21 242 lb 6.4 oz (110 kg)  ?10/03/21 244 lb (110.7 kg)  ?08/30/21 247 lb 14.4 oz (112.4 kg)  ? ?BMI Readings from Last 3 Encounters:  ?10/19/21 44.34 kg/m?  ?10/03/21 44.63 kg/m?  ?08/30/21 45.34 kg/m?  ? ? ?Assessment/Interventions: Review of patient past medical history, allergies, medications, health status, including review of consultants reports, laboratory and other test data, was performed as part of comprehensive evaluation and provision of chronic care management services.  ? ?SDOH:  (Social Determinants of Health) assessments and interventions performed: Yes ? ? ? ?SDOH Screenings  ? ?Alcohol Screen: Low Risk   ? Last Alcohol Screening Score (AUDIT): 0  ?Depression (PHQ2-9): Medium Risk  ? PHQ-2 Score: 9  ?Financial Resource Strain: Medium Risk  ? Difficulty of Paying Living Expenses: Somewhat  hard  ?Food Insecurity: Food Insecurity Present  ? Worried About Charity fundraiser in the Last Year: Sometimes true  ?  Ran Out of Food in the Last Year: Never true  ?Housing: Low Risk   ? Last Housing Risk Score: 0  ?Physical Activity: Inactive  ? Days of Exercise per Week: 0 days  ? Minutes of Exercise per Session: 0 min  ?Social Connections: Moderately Isolated  ? Frequency of Communication with Friends and Family: More than three times a week  ? Frequency of Social Gatherings with Friends and Family: Once a week  ? Attends Religious Services: Never  ? Active Member of Clubs or Organizations: No  ? Attends Archivist Meetings: Never  ? Marital Status: Living with partner  ?Stress: No Stress Concern Present  ? Feeling of Stress : Not at all  ?Tobacco Use: Low Risk   ? Smoking Tobacco Use: Never  ? Smokeless Tobacco Use: Never  ? Passive Exposure: Not on file  ?Transportation Needs: No Transportation Needs  ? Lack of Transportation (Medical): No  ? Lack of Transportation (Non-Medical): No  ? ? ?CCM Care Plan ? ?Allergies  ?Allergen Reactions  ? Codeine Other (See Comments)  ?  "TRIPPED OUT"  DIDN'T [place]  ? Atorvastatin   ?  muscle pain  ? Hydrocodone   ?  itching  ? Tramadol   ? Latex Rash  ? Zolpidem Other (See Comments)  ?  Sleep walk  ? ? ?Medications Reviewed Today   ? ? Reviewed by Flora Lipps, MD (Physician) on 10/19/21 at 1622  Med List Status: <None>  ? ?Medication Order Taking? Sig Documenting Provider Last Dose Status Informant  ?ACCU-CHEK SOFTCLIX LANCETS lancets [id number] Yes  [provider] Taking Active Self  ?acetaminophen (TYLENOL 8 HOUR) 650 MG CR tablet [id number] Yes Take 1 tablet (650 mg total) by mouth every 8 (eight) hours as needed for pain. Steele Sizer, MD Taking Active Self  ?Alcohol Swabs (B-D SINGLE USE SWABS REGULAR) PADS [id number] Yes  [provider] Taking Active Self  ?amLODipine (NORVASC) 5 MG tablet [id number] Yes Take 1 tablet (5 mg  total) by mouth daily. Steele Sizer, MD Taking Active   ?ASPIRIN LOW DOSE 81 MG EC tablet [id number] Yes TAKE 1 TABLET (81 MG TOTAL) BY MOUTH DAILY. Steele Sizer, MD Taking Active Self  ?Blood Gl

## 2022-01-05 ENCOUNTER — Telehealth: Payer: Self-pay | Admitting: Internal Medicine

## 2022-01-05 ENCOUNTER — Telehealth: Payer: Self-pay

## 2022-01-05 MED ORDER — ALBUTEROL SULFATE HFA 108 (90 BASE) MCG/ACT IN AERS: 2.0000 | INHALATION_SPRAY | Freq: Four times a day (QID) | RESPIRATORY_TRACT | 2 refills | Status: DC | PRN

## 2022-01-05 NOTE — Progress Notes (Signed)
? ? ?Chronic Care Management ?Pharmacy Assistant  ? ?Name: Summer [name]  MRN: [id number] DOB: 1951/01/26 ? ?Reason for Encounter: Medication Review/Patient Assistance application for [place].  ?  ? ?Recent office visits:  ?No recent office visit ? ?Recent consult visits:  ?No recent consult visit ? ?Hospital visits:  ?None in previous 6 months ? ?Medications: ?Outpatient Encounter Medications as of 01/05/2022  ?Medication Sig Note  ? ACCU-CHEK SOFTCLIX LANCETS lancets    ? acetaminophen (TYLENOL 8 HOUR) 650 MG CR tablet Take 1 tablet (650 mg total) by mouth every 8 (eight) hours as needed for pain.   ? Alcohol Swabs (B-D SINGLE USE SWABS REGULAR) PADS    ? amLODipine (NORVASC) 5 MG tablet Take 1 tablet (5 mg total) by mouth daily.   ? ASPIRIN LOW DOSE 81 MG EC tablet TAKE 1 TABLET (81 MG TOTAL) BY MOUTH DAILY.   ? Blood Glucose Monitoring Suppl (ACCU-CHEK AVIVA PLUS) w/Device KIT    ? Budeson-Glycopyrrol-Formoterol (BREZTRI AEROSPHERE) 160-9-4.8 MCG/ACT AERO Inhale 2 puffs into the lungs in the morning and at bedtime.   ? Budeson-Glycopyrrol-Formoterol (BREZTRI AEROSPHERE) 160-9-4.8 MCG/ACT AERO Inhale 2 puffs into the lungs in the morning and at bedtime.   ? buPROPion (WELLBUTRIN XL) 150 MG 24 hr tablet Take 1 tablet (150 mg total) by mouth daily.   ? Cholecalciferol (VITAMIN D3) 25 MCG (1000 UT) CAPS Take 1,000 Units by mouth.   ? Cyanocobalamin (VITAMIN B12 PO) Take 1 tablet by mouth daily.    ? dapagliflozin propanediol (FARXIGA) 10 MG TABS tablet Take 1 tablet by mouth in the morning. 08/30/2021: Too expensive ?  ? ezetimibe (ZETIA) 10 MG tablet Take 1 tablet (10 mg total) by mouth daily.   ? furosemide (LASIX) 20 MG tablet Take 1 tablet by mouth daily.   ? insulin NPH-regular Human (70-30) 100 UNIT/ML injection Inject 100 Units into the skin daily with supper. 08/26/2020: Pt only taking once daily at night ?  ? irbesartan-hydrochlorothiazide (AVALIDE) 300-12.5 MG tablet Take 1 tablet by mouth daily.   ?  levocetirizine (XYZAL) 5 MG tablet Take 1 tablet (5 mg total) by mouth every evening.   ? levothyroxine (SYNTHROID) 25 MCG tablet Take 1 tablet by mouth daily except, take 1 and 1/2 tablets by mouth on Sunday.   ? metFORMIN (GLUCOPHAGE) 500 MG tablet Take 1,000 mg by mouth daily with breakfast.   ? metoprolol tartrate (LOPRESSOR) 25 MG tablet Take 1 tablet (25 mg total) by mouth 2 (two) times daily.   ? Misc Natural Products (OSTEO BI-FLEX ADV JOINT SHIELD PO) Take by mouth 2 (two) times daily.    ? modafinil (PROVIGIL) 100 MG tablet Take 1 tablet by mouth daily.   ? montelukast (SINGULAIR) 10 MG tablet Take 1 tablet (10 mg total) by mouth at bedtime.   ? omeprazole (PRILOSEC) 40 MG capsule Take 1 capsule (40 mg total) by mouth daily.   ? Potassium 99 MG TABS Take by mouth daily at 6 (six) AM.   ? pregabalin (LYRICA) 300 MG capsule Take 300 mg by mouth 2 (two) times a day.   ? Probiotic Product (PROBIOTIC-10 PO) Take by mouth.   ? RELION INSULIN SYRINGE 1ML/31G 31G X 5/16" 1 ML MISC    ? rosuvastatin (CRESTOR) 40 MG tablet Take 1 tablet (40 mg total) by mouth daily.   ? sertraline (ZOLOFT) 100 MG tablet Take 1.5 tablets (150 mg total) by mouth daily.   ? traZODone (DESYREL) 100 MG tablet Take 1  tablet (100 mg total) by mouth at bedtime as needed for sleep.   ? VASCEPA 1 g capsule Take 2 capsules (2 g total) by mouth 2 (two) times daily.   ? ?No facility-administered encounter medications on file as of 01/05/2022.  ? ? ?Care Gaps: ?Shingrix Vaccine ?COIVD-19 Vaccine ? ?Star Rating Drugs: ?Irbesartan-HTCZ 300-12.5 mg last filled in 12/14/2021 for 30 day supply at [facility]. ?Metformin 500 mg last filled in 12/14/2021 for 30 day supply at [facility]. ?Rosuvastatin 20 mg last filled in 12/14/2021 for 30 day supply at [facility]. ?  ?Medication Fill Gaps: ?None ? ?I received a task from Junius Argyle, CPP requesting that I start the application for patient assistance on the medication Farxiga.  ?  ?I have  attempted without success to contact this patient by phone several times to get verbal consent to complete online application for [place]. I completed application form and will send to Clinical pharmacist for review and to mail to patient address.Patient will need to complete her part, and return the application to Dr. Holley Raring to sign and to Fax to [employer] for processing.  ? ?Bessie Kellihan,CPA ?Clinical Pharmacist Assistant ?[phone]  ? ? ?

## 2022-01-05 NOTE — Patient Instructions (Signed)
Visit Information ?It was great speaking with you today!  Please let me know if you have any questions about our visit. ? ? Goals Addressed   ? ?  ?  ?  ?  ? This Visit's Progress  ?  Monitor and Manage My Blood Sugar-Diabetes Type 2   On track  ?  Timeframe:  Long-Range Goal ?Priority:  High ?Start Date: 03/11/2021                             ?Expected End Date: 07/12/2022                     ? ?Follow Up within 90 days  ?  ?- check blood sugar at prescribed times ?- check blood sugar if I feel it is too high or too low ?- take the blood sugar log to all doctor visits  ?  ?Why is this important?   ?Checking your blood sugar at home helps to keep it from getting very high or very low.  ?Writing the results in a diary or log helps the doctor know how to care for you.  ?Your blood sugar log should have the time, date and the results.  ?Also, write down the amount of insulin or other medicine that you take.  ?Other information, like what you ate, exercise done and how you were feeling, will also be helpful.   ?  ?Notes:  ?  ? ?  ? ? ?Patient Care Plan: General Pharmacy (Adult)  ?  ? ?Problem Identified: Hypertension, Hyperlipidemia, Diabetes, Coronary Artery Disease, Asthma, Chronic Kidney Disease, Hypothyroidism, Osteoarthritis, and Insomnia   ?Priority: High  ?  ? ?Long-Range Goal: Patient-Specific Goal   ?Start Date: 03/11/2021  ?Expected End Date: 08/08/2022  ?This Visit's Progress: On track  ?Recent Progress: On track  ?Priority: High  ?Note:   ?Current Barriers:  ?Suboptimal therapeutic regimen for hypertension ?Suboptimal therapeutic regimen for GERD ? ?Pharmacist Clinical Goal(s):  ?Patient will maintain control of diabetes as evidenced by A1c less than 8%  ?maintain control of blood pressure as evidenced by BP less than 140/90  through collaboration with PharmD and provider.  ? ?Interventions: ?1:1 collaboration with Steele Sizer, MD regarding development and update of comprehensive plan of care as evidenced by  provider attestation and co-signature ?Inter-disciplinary care team collaboration (see longitudinal plan of care) ?Comprehensive medication review performed; medication list updated in electronic medical record ? ?Hypertension (BP goal <140/90) ?-Controlled ?-Current treatment: ?Amlodipine 5 mg daily  ?Furosemide 20 mg daily  ?Irbesartan-HCTZ 300-12.5 mg daily  ?Metoprolol tartrate 25 mg twice daily  ?-Medications previously tried: NA  ?-Current home readings: NA ?-Current dietary habits: 10-15 glasses water/diet [place] de[place]. ?-Current exercise habits: Minimal exercise, limited by dyspnea   ?-Denies hypotensive/hypertensive symptoms ?-Continue current medications ? ?Hyperlipidemia: (LDL goal < 70) ?-Controlled ?-Current treatment: ?Ezetimibe 10 mg daily  ?Rosuvastatin 40 mg daily  ?Vascepa 2 g twice daily  ?-Current antiplatelet treatment: ?Aspirin 81 mg daily  ?-Medications previously tried: NA ?-Recommended to continue current medication ? ?Diabetes (A1c goal <8%) ?-Controlled ?-Managed by Dr. Honor Junes  ?-Diagnosed 2012  ?-Current medications: ?Farxiga 10 mg daily  ?NPH 70/30 100 units with supper  ?Metformin 500 mg daily  ?-Medications previously tried: NA  ?-Current home glucose readings ?fasting glucose: 80-130  ?post prandial glucose: NA ?-Reports hypoglycemic/hyperglycemic symptoms: two instances of hypoglycemia overnight.  ?-Counseled to take Insulin 15-30 minutes prior to meals  ?-  START PAP for FARXIGA  ?-Recommended to continue current medication ? ?COPD (Goal: control symptoms and prevent exacerbations) ?-Controlled ?-Current treatment  ?Montelukast 10 mg nightly  ?-Medications previously tried: NA  ?-MMRC/CAT score: Short of breath walking from front door to car. Improves with rest after 5 minutes.  ?-Pulmonary function testing: FEV1/FVC 81%, FEV1 89% (2015)  ?-Exacerbations requiring treatment in last 6 months: None ?-Patient did not notice any improvement when using inhalers.  ?-Recommended to  continue current medication ? ?Depression/Anxiety (Goal: Maintain stable mood) ?-Controlled ?-Current treatment: ?Wellbutrin XL 150 mg daily  ?Modafinil 100 mg daily  ?Sertraline 100 mg 1.5 tablets daily  ?Trazodone 50 mg nightly as needed (takes nightly)  ?-Medications previously tried/failed: NA ?-Recommended to continue current medication ? ?Hypothyroidism (Goal: Maintain stable thyroid function) ?-Controlled ?-Current treatment  ?Levothyroxine 25 mcg 1 tablet daily, 1.5 tablets on sundays  ?-Medications previously tried: NA  ?-Recommended to continue current medication ? ?Osteoarthritis (Goal: Improve symptom control) ?-Controlled ?-Current treatment  ?Acetaminophen CR 650 mg twice daily  ?Osteo-Bi Flex  ?-Medications previously tried: NA ?-Has been taking osteo Bi-flex supplement for ~ 1 year with no noticeable improvement.  ?-Counseled on appropriate use of acetminophen for osteoarthritis and counseled to avoid doses greater than 3000 mg daily   ?-Recommended STOPPING Osteo Bi-Flex (ineffective) ? ?GERD (Goal: Prevent Heartburn/Reflux) ?-Not ideally controlled ?-Current treatment  ?Omeprazole 40 mg daily (HS)  ?-Medications previously tried: Ranitidine  ?-Recommended to continue current medication ? ?Chronic Kidney Disease Stage 3a  ?-All medications assessed for renal dosing and appropriateness in chronic kidney disease. ?-Rosuvastatin: Acceptable in eGFR >30. Could consider switching to atorvastatin if renal function declines  ?-Metformin dose appropriate given current kidney function, but will require frequent monitoring to ensure stable kidney function.  ?-Recommended to continue current medication ? ?Patient Goals/Self-Care Activities ?Patient will:  ?- check glucose daily, document, and provide at future appointments ?check blood pressure weekly, document, and provide at future appointments ? ?Follow Up Plan: Telephone follow up appointment with care management team member scheduled for:  02/22/2022 at  3:00 PM ?  ? ? ? ?Patient agreed to services and verbal consent obtained.  ? ?Patient verbalizes understanding of instructions and care plan provided today and agrees to view in [place]. Active MyChart status confirmed with patient.   ? ?Doristine Section, BCACP, CPP ?Clinical Pharmacist Practitioner  ?[facility] ?[phone]  ?

## 2022-01-05 NOTE — Telephone Encounter (Signed)
Albuterol HFA sent to preferred pharmacy. ?Lm for [facility]. ?

## 2022-01-06 NOTE — Telephone Encounter (Signed)
Lm for patient.  

## 2022-01-08 DIAGNOSIS — E785 Hyperlipidemia, unspecified: Secondary | ICD-10-CM

## 2022-01-08 DIAGNOSIS — E1169 Type 2 diabetes mellitus with other specified complication: Secondary | ICD-10-CM

## 2022-01-08 DIAGNOSIS — Z794 Long term (current) use of insulin: Secondary | ICD-10-CM

## 2022-01-09 NOTE — Telephone Encounter (Signed)
Lm x2 for patient.  Will close encounter per office protocol.   

## 2022-02-10 ENCOUNTER — Telehealth: Payer: Self-pay

## 2022-02-10 NOTE — Telephone Encounter (Signed)
Returned call to pt to discuss DM shoes. VM full. Pt will need to have on order entered to proceed. Forwarding to [facility] for order.

## 2022-02-20 ENCOUNTER — Encounter: Payer: Self-pay | Admitting: Family Medicine

## 2022-02-20 ENCOUNTER — Ambulatory Visit (INDEPENDENT_AMBULATORY_CARE_PROVIDER_SITE_OTHER): Payer: No Typology Code available for payment source | Admitting: Family Medicine

## 2022-02-20 ENCOUNTER — Ambulatory Visit: Payer: Self-pay | Admitting: *Deleted

## 2022-02-20 VITALS — BP 110/66 | HR 95 | Temp 97.7°F | Resp 18 | Ht 62.0 in | Wt 245.7 lb

## 2022-02-20 DIAGNOSIS — J441 Chronic obstructive pulmonary disease with (acute) exacerbation: Secondary | ICD-10-CM | POA: Diagnosis not present

## 2022-02-20 DIAGNOSIS — J029 Acute pharyngitis, unspecified: Secondary | ICD-10-CM | POA: Diagnosis not present

## 2022-02-20 DIAGNOSIS — J069 Acute upper respiratory infection, unspecified: Secondary | ICD-10-CM

## 2022-02-20 LAB — POCT RAPID STREP A (OFFICE): Rapid Strep A Screen: NEGATIVE

## 2022-02-20 MED ORDER — ALBUTEROL SULFATE (2.5 MG/3ML) 0.083% IN NEBU
2.5000 mg | INHALATION_SOLUTION | Freq: Once | RESPIRATORY_TRACT | Status: AC
  Administered 2022-02-20: 2.5 mg via RESPIRATORY_TRACT

## 2022-02-20 MED ORDER — MONTELUKAST SODIUM 10 MG PO TABS: 10.0000 mg | ORAL_TABLET | Freq: Every day | ORAL | 1 refills | Status: DC

## 2022-02-20 MED ORDER — BENZONATATE 100 MG PO CAPS: 100.0000 mg | ORAL_CAPSULE | Freq: Three times a day (TID) | ORAL | 1 refills | Status: DC | PRN

## 2022-02-20 MED ORDER — PREDNISONE 20 MG PO TABS
40.0000 mg | ORAL_TABLET | Freq: Every day | ORAL | 0 refills | Status: AC
Start: 2022-02-20 — End: 2022-02-25

## 2022-02-20 MED ORDER — AZITHROMYCIN 250 MG PO TABS: ORAL_TABLET | ORAL | 0 refills | Status: DC

## 2022-02-20 MED ORDER — LEVOCETIRIZINE DIHYDROCHLORIDE 5 MG PO TABS: 5.0000 mg | ORAL_TABLET | Freq: Every evening | ORAL | 1 refills | Status: DC

## 2022-02-20 NOTE — Patient Instructions (Signed)
Please restart your allergy/sinus meds for the next couple weeks including xyzal and montelukast Use the albuterol inhaler more frequently when sick Let us know if you feel like a nebulizer machine would be more helpful to have at home for exacerbations  Continue the mucinex and cordicidin at home Start the prednisone steroids today   Meds ordered this encounter  Medications   predniSONE (DELTASONE) 20 MG tablet    Sig: Take 2 tablets (40 mg total) by mouth daily with breakfast for 5 days.    Dispense:  10 tablet    Refill:  0    Order Specific Question:   Supervising Provider    Answer:   Steele Sizer [[id number]]   levocetirizine (XYZAL) 5 MG tablet    Sig: Take 1 tablet (5 mg total) by mouth every evening.    Dispense:  90 tablet    Refill:  1    Order Specific Question:   Supervising Provider    Answer:   Steele Sizer [[id number]]   montelukast (SINGULAIR) 10 MG tablet    Sig: Take 1 tablet (10 mg total) by mouth at bedtime.    Dispense:  90 tablet    Refill:  1    Order Specific Question:   Supervising Provider    Answer:   Steele Sizer [[id number]]   benzonatate (TESSALON) 100 MG capsule    Sig: Take 1-2 capsules (100-200 mg total) by mouth 3 (three) times daily as needed for cough.    Dispense:  30 capsule    Refill:  1    Order Specific Question:   Supervising Provider    Answer:   Steele Sizer [[id number]]   albuterol (PROVENTIL) (2.5 MG/3ML) 0.083% nebulizer solution 2.5 mg     Chronic Obstructive Pulmonary Disease Exacerbation  Chronic obstructive pulmonary disease (COPD) is a long-term (chronic) lung problem. In COPD, the flow of air from the lungs is limited. COPD exacerbations are times that breathing gets worse and you need more than your normal treatment. Without treatment, they can be life-threatening. If they happen often, your lungs can become more damaged. What are the causes? Having infections that affect your airways and lungs. Being exposed to: Smoke. Air  pollution. Chemical fumes. Dust. Things that can cause an allergic reaction (allergens). Not taking your usual COPD medicines as told. Having medical problems already, such as heart failure or infections not involving the lungs. In many cases, the cause is not known. What increases the risk? Smoking. Being an older adult. Having frequent prior COPD exacerbations. What are the signs or symptoms? Increased coughing. Increased mucus from your lungs. Increased wheezing. Increased shortness of breath. Fast breathing and finding it hard to breathe. Chest tightness. Less energy than usual. Sleep disruption from symptoms. Confusion. Increased sleepiness. Often, these symptoms happen or get worse even with the use of medicines. How is this treated? Treatment for this condition depends on how bad it is and the cause of the symptoms. You may need to stay in the hospital for treatment. Treatment may include: Taking medicines. Using oxygen. Being treated with different ways to clear your airway, such as using a mask to deliver oxygen. Follow these instructions at home: Medicines Take over-the-counter and prescription medicines only as told by your doctor. Use all inhaled medicines the correct way. If you were prescribed an antibiotic or steroid medicine, take it as told by your doctor. Do not stop taking it even if you start to feel better. Lifestyle Do not smoke or use any  products that contain nicotine or tobacco. If you need help quitting, ask your doctor. Eat healthy foods. Exercise regularly. Get enough sleep. Most adults need 7 or more hours per night. Avoid tobacco smoke and other things that can bother your lungs. Several times a day, wash your hands with soap and water for at least 20 seconds. If you cannot use soap and water, use hand sanitizer. This may help keep you from getting an infection. During flu season, avoid areas that are crowded with people. General  instructions Drink enough fluid to keep your pee (urine) pale yellow. Do not do this if your doctor has told you not to. Use a cool mist machine (vaporizer). If you use oxygen or a machine that turns medicine into a mist (nebulizer), continue to use it as told. Keep all follow-up visits. How is this prevented? Keep up with shots (vaccinations) as told by your doctor. Be sure to get a yearly flu (influenza) shot. If you smoke, quit smoking. Smoking makes the problem worse. Follow all instructions for rehabilitation. These are steps you can take to make your body work better. Work with your doctor to develop and follow an action plan. This tells you what steps to take when you experience certain symptoms. Contact a doctor if: Your COPD symptoms get worse than normal. Get help right away if: You are short of breath and it gets worse, even when you are resting. You have trouble talking. You have chest pain. You cough up blood. You have a fever. You keep vomiting. You feel weak or you pass out (faint). You feel confused. You are not able to sleep because of your symptoms. You have trouble doing daily activities. These symptoms may be an emergency. Get help right away. Call your local emergency services (911 in the [place] Do not wait to see if the symptoms will go away. Do not drive yourself to the hospital. Summary COPD exacerbations are times that breathing gets worse and you need more treatment than normal. COPD exacerbations can be very serious and may cause your lungs to become more damaged. Do not smoke. If you need help quitting, ask your doctor. Stay up to date on your shots. Get a flu shot every year. This information is not intended to replace advice given to you by your health care provider. Make sure you discuss any questions you have with your health care provider. Document Revised: 07/21/2020 Document Reviewed: 07/06/2020 Elsevier Patient Education  [place].

## 2022-02-20 NOTE — Progress Notes (Signed)
Patient ID: Summer [name], female    DOB: 06-07-1951, 71 y.o.   MRN: [id number]  PCP: Steele Sizer, MD  Chief Complaint  Patient presents with   Cough    All sx started since last Wednesday   Shortness of Breath   Sore Throat    Subjective:   Summer [name] is a 71 y.o. female, presents to clinic with CC of the following: Sx worsening for 1.5 + weeks after being in the [place] started after exposed to wind etc Nasal discharge, post-nasal drip, sore throat, worse cough than baseline, increased sputum, SOB, wheeze, ribs sore from coughing, she has COPD is on her Brextri inhaler but is not using albuterol rescue inhaler  Cough This is a recurrent problem. The cough is Productive of sputum (clear mostly, sometimes some blood tinge). Associated symptoms include headaches, hemoptysis, nasal congestion, postnasal drip, rhinorrhea, a sore throat, shortness of breath and wheezing. Pertinent negatives include no chest pain, chills, ear congestion, ear pain, fever, heartburn, myalgias, rash, sweats or weight loss. The symptoms are aggravated by lying down. Risk factors for lung disease include travel (copd). She has tried body position changes (mucinex and cordicidin) for the symptoms. The treatment provided no relief. Her past medical history is significant for COPD and pneumonia. There is no history of environmental allergies.    No fever, chills, sweats, body aches, severe fatigue She endorses sinus pressure mild HA  Sugars normal Last exacerbation was about a year ago   Patient Active Problem List   Diagnosis Date Noted   Chronic obstructive pulmonary disease ([place]) 01/28/2021   Atherosclerosis of native arteries of the extremities with ulceration ([place]) 12/03/2020   Diabetes mellitus ([place]) 05/24/2020   B12 deficiency 05/24/2020   Vitamin D deficiency 05/24/2020   Metatarsalgia of left foot 01/15/2020   Major depressive disorder, recurrent episode, moderate ([place]) 04/04/2019    Left arm weakness 04/03/2019   (HFpEF) heart failure with preserved ejection fraction ([place]) 08/31/2017   Moderate persistent asthma 08/31/2017   Hyperlipidemia associated with type 2 diabetes mellitus ([place]) 06/14/2017   Dyslipidemia associated with type 2 diabetes mellitus ([place]) 08/18/2016   Charcot foot due to diabetes mellitus ([place]) 02/22/2016   Acquired abduction deformity of foot 07/12/2015   Osteoarthritis of subtalar joint 07/12/2015   Poorly controlled type 2 diabetes mellitus with neuropathy ([place]) 07/12/2015   Arthritis of foot, degenerative 07/12/2015   Carpal tunnel syndrome 04/17/2015   Chronic constipation 04/17/2015   Insomnia, persistent 04/17/2015   Stage 3 chronic kidney disease ([place]) 04/17/2015   Decreased exercise tolerance 04/17/2015   Diabetes mellitus with polyneuropathy ([place]) 04/17/2015   Gastro-esophageal reflux disease without esophagitis 04/17/2015   Bursitis, trochanteric 04/17/2015   Cephalalgia 04/17/2015   Hypertension associated with diabetes ([place]) 04/17/2015   Adult hypothyroidism 04/17/2015   Hearing loss 04/17/2015   Chronic recurrent major depressive disorder ([place]) 04/17/2015   Neurogenic claudication 04/17/2015   Class 3 severe obesity with serious comorbidity and body mass index (BMI) of 45.0 to 49.9 in adult ([place]) 04/17/2015   Hypo-ovarianism 04/17/2015   Perennial allergic rhinitis with seasonal variation 04/17/2015   Acne erythematosa 04/17/2015   Dyskinesia, tardive 04/17/2015   Memory loss 04/17/2015   Impingement syndrome of shoulder 04/17/2015   Dermatitis, stasis 04/17/2015   Obstructive sleep apnea 05/14/2014   Shortness of breath on exertion 05/06/2014   Mixed hyperlipidemia 02/06/2012   LBP (low back pain) 09/16/2008      Current Outpatient Medications:  ACCU-CHEK SOFTCLIX LANCETS lancets, , Disp: , Rfl:    acetaminophen (TYLENOL 8 HOUR) 650 MG CR tablet, Take 1 tablet (650 mg total) by mouth every 8 (eight) hours as needed  for pain., Disp: 90 tablet, Rfl: 2   albuterol (VENTOLIN HFA) 108 (90 Base) MCG/ACT inhaler, Inhale 2 puffs into the lungs every 6 (six) hours as needed for wheezing or shortness of breath., Disp: 8 g, Rfl: 2   Alcohol Swabs (B-D SINGLE USE SWABS REGULAR) PADS, , Disp: , Rfl:    amLODipine (NORVASC) 5 MG tablet, Take 1 tablet (5 mg total) by mouth daily., Disp: 90 tablet, Rfl: 1   ASPIRIN LOW DOSE 81 MG EC tablet, TAKE 1 TABLET (81 MG TOTAL) BY MOUTH DAILY., Disp: 30 tablet, Rfl: 0   Blood Glucose Monitoring Suppl (ACCU-CHEK AVIVA PLUS) w/Device KIT, , Disp: , Rfl:    Budeson-Glycopyrrol-Formoterol (BREZTRI AEROSPHERE) 160-9-4.8 MCG/ACT AERO, Inhale 2 puffs into the lungs in the morning and at bedtime., Disp: 5.9 g, Rfl: 0   Budeson-Glycopyrrol-Formoterol (BREZTRI AEROSPHERE) 160-9-4.8 MCG/ACT AERO, Inhale 2 puffs into the lungs in the morning and at bedtime., Disp: 10.7 g, Rfl: 5   buPROPion (WELLBUTRIN XL) 150 MG 24 hr tablet, Take 1 tablet (150 mg total) by mouth daily., Disp: 90 tablet, Rfl: 1   Cholecalciferol (VITAMIN D3) 25 MCG (1000 UT) CAPS, Take 1,000 Units by mouth., Disp: , Rfl:    Cyanocobalamin (VITAMIN B12 PO), Take 1 tablet by mouth daily. , Disp: , Rfl:    dapagliflozin propanediol (FARXIGA) 10 MG TABS tablet, Take 1 tablet by mouth in the morning., Disp: , Rfl:    ezetimibe (ZETIA) 10 MG tablet, Take 1 tablet (10 mg total) by mouth daily., Disp: 90 tablet, Rfl: 1   furosemide (LASIX) 20 MG tablet, Take 1 tablet by mouth daily., Disp: 90 tablet, Rfl: 0   insulin NPH-regular Human (70-30) 100 UNIT/ML injection, Inject 100 Units into the skin daily with supper., Disp: , Rfl:    irbesartan-hydrochlorothiazide (AVALIDE) 300-12.5 MG tablet, Take 1 tablet by mouth daily., Disp: 90 tablet, Rfl: 1   levocetirizine (XYZAL) 5 MG tablet, Take 1 tablet (5 mg total) by mouth every evening., Disp: 90 tablet, Rfl: 1   levothyroxine (SYNTHROID) 25 MCG tablet, Take 1 tablet by mouth daily except,  take 1 and 1/2 tablets by mouth on Sunday., Disp: 100 tablet, Rfl: 0   metFORMIN (GLUCOPHAGE) 500 MG tablet, Take 1,000 mg by mouth daily with breakfast., Disp: , Rfl:    metoprolol tartrate (LOPRESSOR) 25 MG tablet, Take 1 tablet (25 mg total) by mouth 2 (two) times daily., Disp: 180 tablet, Rfl: 0   Misc Natural Products (OSTEO BI-FLEX ADV JOINT SHIELD PO), Take by mouth 2 (two) times daily. , Disp: , Rfl:    modafinil (PROVIGIL) 100 MG tablet, Take 1 tablet by mouth daily., Disp: 90 tablet, Rfl: 0   montelukast (SINGULAIR) 10 MG tablet, Take 1 tablet (10 mg total) by mouth at bedtime., Disp: 90 tablet, Rfl: 1   omeprazole (PRILOSEC) 40 MG capsule, Take 1 capsule (40 mg total) by mouth daily., Disp: 90 capsule, Rfl: 1   Potassium 99 MG TABS, Take by mouth daily at 6 (six) AM., Disp: , Rfl:    pregabalin (LYRICA) 300 MG capsule, Take 300 mg by mouth 2 (two) times a day., Disp: , Rfl:    Probiotic Product (PROBIOTIC-10 PO), Take by mouth., Disp: , Rfl:    RELION INSULIN SYRINGE 1ML/31G 31G X 5/16" 1  ML MISC, , Disp: , Rfl:    rosuvastatin (CRESTOR) 40 MG tablet, Take 1 tablet (40 mg total) by mouth daily., Disp: 90 tablet, Rfl: 1   sertraline (ZOLOFT) 100 MG tablet, Take 1.5 tablets (150 mg total) by mouth daily., Disp: 135 tablet, Rfl: 1   traZODone (DESYREL) 100 MG tablet, Take 1 tablet (100 mg total) by mouth at bedtime as needed for sleep., Disp: 90 tablet, Rfl: 1   VASCEPA 1 g capsule, Take 2 capsules (2 g total) by mouth 2 (two) times daily., Disp: 120 capsule, Rfl: 5   Allergies  Allergen Reactions   Codeine Other (See Comments)    "TRIPPED OUT"  DIDN'T LIKE THE WAY IT FELT   Atorvastatin     muscle pain   Hydrocodone     itching   Tramadol    Latex Rash   Zolpidem Other (See Comments)    Sleep walk     Social History   Tobacco Use   Smoking status: Never   Smokeless tobacco: Never  Vaping Use   Vaping Use: Never used  Substance Use Topics   Alcohol use: No     Alcohol/week: 0.0 standard drinks of alcohol   Drug use: No      Chart Review Today: I personally reviewed active problem list, medication list, allergies, family history, social history, health maintenance, notes from last encounter, lab results, imaging with the patient/caregiver today.   Review of Systems  Constitutional: Negative.  Negative for chills, fever and weight loss.  HENT:  Positive for postnasal drip, rhinorrhea and sore throat. Negative for ear pain.   Eyes: Negative.   Respiratory:  Positive for cough, hemoptysis, shortness of breath and wheezing.   Cardiovascular: Negative.  Negative for chest pain.  Gastrointestinal: Negative.  Negative for heartburn.  Endocrine: Negative.   Genitourinary: Negative.   Musculoskeletal: Negative.  Negative for myalgias.  Skin: Negative.  Negative for rash.  Allergic/Immunologic: Negative.  Negative for environmental allergies.  Neurological:  Positive for headaches.  Hematological: Negative.   Psychiatric/Behavioral: Negative.    All other systems reviewed and are negative.      Objective:   Vitals:   02/20/22 1015  BP: 110/66  Pulse: 95  Resp: 18  Temp: 97.7 F (36.5 C)  TempSrc: Oral  SpO2: 94%  Weight: 245 lb 11.2 oz (111.4 kg)  Height: _0  (1.575 m)    Body mass index is 44.94 kg/m.  Physical Exam Vitals and nursing note reviewed.  Constitutional:      General: She is not in acute distress.    Appearance: She is obese. She is ill-appearing (mildly ill). She is not toxic-appearing or diaphoretic.  HENT:     Head: Normocephalic and atraumatic.     Right Ear: Tympanic membrane, ear canal and external ear normal.     Left Ear: Tympanic membrane, ear canal and external ear normal.     Nose: Mucosal edema, congestion and rhinorrhea present. Rhinorrhea is clear.     Right Sinus: No maxillary sinus tenderness or frontal sinus tenderness.     Left Sinus: No maxillary sinus tenderness or frontal sinus tenderness.      Mouth/Throat:     Mouth: Mucous membranes are moist.     Pharynx: Oropharynx is clear. Uvula midline. No pharyngeal swelling, oropharyngeal exudate, posterior oropharyngeal erythema or uvula swelling.     Tonsils: No tonsillar exudate. 0 on the right. 0 on the left.  Eyes:     General: No scleral  icterus.       Right eye: No discharge.        Left eye: No discharge.     Conjunctiva/sclera: Conjunctivae normal.     Pupils: Pupils are equal, round, and reactive to light.  Neck:     Trachea: Trachea normal.     Comments: Scratchy phonation Cardiovascular:     Rate and Rhythm: Normal rate.     Pulses: Normal pulses.     Heart sounds: Normal heart sounds.  Pulmonary:     Effort: Tachypnea present. No accessory muscle usage, respiratory distress or retractions.     Breath sounds: Decreased air movement (splinted inspiratory effort due to coughing fits) present. No stridor. Rhonchi present. No wheezing or rales.     Comments: Severe coughing fits with talking or with trying to take deep breaths, speaking in short sentences Abdominal:     General: Bowel sounds are normal.     Palpations: Abdomen is soft.  Musculoskeletal:     Right lower leg: No edema.     Left lower leg: No edema.  Lymphadenopathy:     Cervical: No cervical adenopathy.  Skin:    General: Skin is warm and dry.     Coloration: Skin is not jaundiced.  Neurological:     Mental Status: She is alert. Mental status is at baseline.  Psychiatric:        Mood and Affect: Mood normal.        Behavior: Behavior normal.      Results for orders placed or performed in visit on 10/03/21  Lipid panel  Result Value Ref Range   Cholesterol 131 <200 mg/dL   HDL 38 (L) > OR = 50 mg/dL   Triglycerides 217 (H) <150 mg/dL   LDL Cholesterol (Calc) 64 mg/dL (calc)   Total CHOL/HDL Ratio 3.4 <5.0 (calc)   Non-HDL Cholesterol (Calc) 93 <130 mg/dL (calc)  Hepatic function panel  Result Value Ref Range   Total Protein 6.7 6.1 - 8.1  g/dL   Albumin 4.4 3.6 - 5.1 g/dL   Globulin 2.3 1.9 - 3.7 g/dL (calc)   AG Ratio 1.9 1.0 - 2.5 (calc)   Total Bilirubin 0.4 0.2 - 1.2 mg/dL   Bilirubin, Direct 0.1 0.0 - 0.2 mg/dL   Indirect Bilirubin 0.3 0.2 - 1.2 mg/dL (calc)   Alkaline phosphatase (APISO) 60 37 - 153 U/L   AST 16 10 - 35 U/L   ALT 12 6 - 29 U/L  TSH  Result Value Ref Range   TSH 1.43 0.40 - 4.50 mIU/L       Assessment & Plan:     ICD-10-CM   1. COPD with acute exacerbation ([name])  J44.1 predniSONE (DELTASONE) 20 MG tablet    benzonatate (TESSALON) 100 MG capsule    albuterol (PROVENTIL) (2.5 MG/3ML) 0.083% nebulizer solution 2.5 mg    azithromycin (ZITHROMAX) 250 MG tablet   x 1.5 weeks, not doing inhalers, restart allergy meds, continue mucinex and cordicidin, steroid burst, tessalon and inhalers, f/up if not improving    2. Sore throat  J02.9 POCT rapid strep A   no cervical lymphadenopathy - likely secondary to post-nasal drip/URI, doubt bacterial pharyngitis    3. Upper respiratory tract infection, unspecified type  J06.9 levocetirizine (XYZAL) 5 MG tablet    montelukast (SINGULAIR) 10 MG tablet    albuterol (PROVENTIL) (2.5 MG/3ML) 0.083% nebulizer solution 2.5 mg   congestion, postnasal drip, sore throat, dry cough, no fever, no severe sinus pain, no  known sick contacts, hx of allergies      hx of asthma and allergies, pt denies this and is not on any allergy meds -refilled and encouraged to restart to decrease allergy triggers and nasal/sinus sx  Pt given an albuterol neb in office and reexamined after She endorsed feeling better able to take a deep breath with less coughing Her BS were less diminished at the bases, able to speak in normal sentences and do deep breathing w/o coughing fits She ambulated out of clinic with her walker independently   Return in about 1 week (around 02/27/2022), or if symptoms worsen or fail to improve, for 1-2 weeks prn.\   Delsa Grana, PA-C 02/20/22 10:30 AM

## 2022-02-20 NOTE — Telephone Encounter (Signed)
Per agent: Pt asked if something can be called in for her cough/ she stated she is coughing so bad her ribs hurt / please advise       Chief Complaint: Cough Symptoms: Cough, productive for small amount clear mucous with blood flecks, mild SOB "Can't get volume." Voice hoarse Frequency: Last Wednesday Pertinent Negatives: Patient denies Fever, wheezing Disposition: '[]'$ ED /'[]'$ Urgent Care (no appt availability in office) / '[x]'$ Appointment(In office/virtual)/ '[]'$  [facility]/ '[]'$ Home Care/ '[]'$ Refused Recommended Disposition /'[]'$ [facility]/ '[]'$  Follow-up with PCP Additional Notes: Pt has appt this AM. Care advise provided, worsening symptoms,go to .ED Reason for Disposition  [1] MILD difficulty breathing (e.g., minimal/no SOB at rest, SOB with walking, pulse <100) AND [2] still present when not coughing  Answer Assessment - Initial Assessment Questions 1. ONSET: "When did the cough begin?"      Wednsday 2. SEVERITY: "How bad is the cough today?"      Awake at night, bad spells during the day 3. SPUTUM: "Describe the color of your sputum" (none, dry cough; clear, white, yellow, green)     Clear, blood flecks 4. HEMOPTYSIS: "Are you coughing up any blood?" If so ask: "How much?" (flecks, streaks, tablespoons, etc.)     Flecks at times 5. DIFFICULTY BREATHING: "Are you having difficulty breathing?" If Yes, ask: "How bad is it?" (e.g., mild, moderate, severe)    - MILD: No SOB at rest, mild SOB with walking, speaks normally in sentences, can lie down, no retractions, pulse < 100.    - MODERATE: SOB at rest, SOB with minimal exertion and prefers to sit, cannot lie down flat, speaks in phrases, mild retractions, audible wheezing, pulse 100-120.    - SEVERE: Very SOB at rest, speaks in single words, struggling to breathe, sitting hunched forward, retractions, pulse > 120      "No volume" 6. FEVER: "Do you have a fever?" If Yes, ask: "What is your temperature, how was it measured,  and when did it start?"     No 7. CARDIAC HISTORY: "Do you have any history of heart disease?" (e.g., heart attack, congestive heart failure)      *No Answer* 8. LUNG HISTORY: "Do you have any history of lung disease?"  (e.g., pulmonary embolus, asthma, emphysema)     *No Answer* 9. PE RISK FACTORS: "Do you have a history of blood clots?" (or: recent major surgery, recent prolonged travel, bedridden)     *No Answer* 10. OTHER SYMPTOMS: "Do you have any other symptoms?" (e.g., runny nose, wheezing, chest pain)       "Feel like in chest"  Protocols used: Cough - Acute Productive-A-AH

## 2022-02-21 ENCOUNTER — Telehealth: Payer: Self-pay

## 2022-02-21 NOTE — Progress Notes (Cosign Needed)
Chronic care Management APPOINTMENT REMINDER   Called Summer [name], No answer, unable to leave  message due to full mailbox to remind patient of appointment on 02/22/2022 at 1:30 pm via telephone visit with Junius Argyle , [facility] Clinical Pharmacist Assistant [phone]

## 2022-02-22 ENCOUNTER — Telehealth: Payer: Medicare HMO

## 2022-02-22 NOTE — Progress Notes (Deleted)
Chronic Care Management Pharmacy Note  02/22/2022 Name:  Summer [name] MRN:  [id number] DOB:  1951/03/11  Summary: Patient presents for CCM follow-up. She had some instances of hypoglycemia due to mistiming insulin administration.   Recommendations/Changes made from today's visit: START PAP For [place]: CPP follow-up in 6 months  Subjective: Summer [name] is an 71 y.o. year old female who is a primary patient of Steele Sizer, MD.  The CCM team was consulted for assistance with disease management and care coordination needs.    Engaged with patient by telephone for follow up visit in response to provider referral for pharmacy case management and/or care coordination services.   Consent to Services:  The patient was given information about Chronic Care Management services, agreed to services, and gave verbal consent prior to initiation of services.  Please see initial visit note for detailed documentation.   Patient Care Team: Steele Sizer, MD as PCP - General (Family Medicine) End, Harrell Gave, MD as Consulting Physician (Cardiology) Lonia Farber, MD as Consulting Physician (Endocrinology) Sharlet Salina, MD as Referring Physician (Physical Medicine and Rehabilitation) Germaine Pomfret, [facility] (Pharmacist) Anthonette Legato, MD (Nephrology) Edrick Kins, DPM as Consulting Physician (Podiatry) Delana Meyer, Dolores Lory, MD (Vascular Surgery)  Recent office visits: 10/03/21: Patient presented to Dr. Ancil Boozer  08/30/21: Patient presented to Clemetine Marker, LPN   Recent consult visits: 12/29/21: Patient presented to Dr. Honor Junes (endocrinology)  12/06/21: Patient presented to Dr. Holley Raring (nephology)  10/19/21: Patient presented to Dr. Rip Harbour visits: None in previous 6 months   Objective:  Lab Results  Component Value Date   CREATININE 1.33 (H) 01/28/2021   BUN 50 (H) 01/28/2021   GFRNONAA 40 (L) 01/28/2021   GFRAA 46 (L) 01/28/2021   NA 140 01/28/2021    K 4.7 01/28/2021   CALCIUM 10.0 01/28/2021   CO2 27 01/28/2021   GLUCOSE 126 (H) 01/28/2021    Lab Results  Component Value Date/Time   HGBA1C 7.8 (A) 05/31/2021 01:49 PM   HGBA1C 7.1 10/07/2020 12:00 AM   HGBA1C 6.7 (H) 05/13/2020 11:46 AM   HGBA1C 6.7 (A) 03/12/2020 11:37 AM   HGBA1C 7.2 (H) 04/02/2019 05:43 PM   HGBA1C 8.1 08/27/2018 02:56 PM   HGBA1C 8.1 (A) 08/27/2018 02:56 PM   HGBA1C 8.1 (A) 08/27/2018 02:56 PM   MICROALBUR 0.6 01/28/2018 03:52 PM   MICROALBUR 20 02/19/2017 10:40 AM   MICROALBUR 20 07/22/2015 02:05 PM    Last diabetic Eye exam:  Lab Results  Component Value Date/Time   HMDIABEYEEXA No Retinopathy 07/08/2021 12:00 AM    Last diabetic Foot exam:  Lab Results  Component Value Date/Time   HMDIABFOOTEX Dr. Honor Junes 12/19/2018 12:00 AM     Lab Results  Component Value Date   CHOL 131 10/03/2021   HDL 38 (L) 10/03/2021   LDLCALC 64 10/03/2021   TRIG 217 (H) 10/03/2021   CHOLHDL 3.4 10/03/2021       Latest Ref Rng & Units 10/03/2021    2:39 PM 01/28/2021    3:00 PM 09/24/2020    1:03 PM  Hepatic Function  Total Protein 6.1 - 8.1 g/dL 6.7  6.5  6.9   Albumin 3.7 - 4.7 g/dL   4.4   AST 10 - 35 U/L $Remo'16  18  15   'woYvT$ ALT 6 - 29 U/L $Remo'12  21  14   'CdBBp$ Alk Phosphatase 44 - 121 IU/L   100   Total Bilirubin 0.2 - 1.2  mg/dL 0.4  0.4  0.3   Bilirubin, Direct 0.0 - 0.2 mg/dL 0.1       Lab Results  Component Value Date/Time   TSH 1.43 10/03/2021 02:39 PM   TSH 1.71 01/28/2021 03:00 PM       Latest Ref Rng & Units 05/13/2020   11:46 AM 10/14/2019   11:42 AM 04/02/2019    5:43 PM  CBC  WBC 3.4 - 10.8 x10E3/uL 8.0  8.6  9.2   Hemoglobin 11.1 - 15.9 g/dL 11.1  11.4  11.8   Hematocrit 34.0 - 46.6 % 33.7  33.7  35.3   Platelets 150 - 450 x10E3/uL 153  238  254     Lab Results  Component Value Date/Time   VD25OH 41.1 05/13/2020 11:46 AM   VD25OH 37 05/01/2018 02:18 PM    Clinical ASCVD: Yes  The 10-year ASCVD risk score (Arnett DK, et al., 2019) is: 22.6%    Values used to calculate the score:     Age: 51 years     Sex: Female     Is Non-Hispanic African American: No     Diabetic: Yes     Tobacco smoker: No     Systolic Blood Pressure: 893 mmHg     Is BP treated: Yes     HDL Cholesterol: 38 mg/dL     Total Cholesterol: 131 mg/dL       02/20/2022   10:15 AM 10/03/2021    1:45 PM 08/30/2021    3:52 PM  Depression screen PHQ 2/9  Decreased Interest 0 2 3  Down, Depressed, Hopeless 0 2 3  PHQ - 2 Score 0 4 6  Altered sleeping 0 0 0  Tired, decreased energy 0 3 3  Change in appetite 0 0 0  Feeling bad or failure about yourself  0 2 1  Trouble concentrating 0 0 0  Moving slowly or fidgety/restless 0 0 1  Suicidal thoughts 0 0 0  PHQ-9 Score 0 9 11  Difficult doing work/chores Not difficult at all  Not difficult at all    Social History   Tobacco Use  Smoking Status Never  Smokeless Tobacco Never   BP Readings from Last 3 Encounters:  02/20/22 110/66  10/19/21 118/74  10/03/21 130/86   Pulse Readings from Last 3 Encounters:  02/20/22 95  10/19/21 76  10/03/21 77   Wt Readings from Last 3 Encounters:  02/20/22 245 lb 11.2 oz (111.4 kg)  10/19/21 242 lb 6.4 oz (110 kg)  10/03/21 244 lb (110.7 kg)   BMI Readings from Last 3 Encounters:  02/20/22 44.94 kg/m  10/19/21 44.34 kg/m  10/03/21 44.63 kg/m    Assessment/Interventions: Review of patient past medical history, allergies, medications, health status, including review of consultants reports, laboratory and other test data, was performed as part of comprehensive evaluation and provision of chronic care management services.   SDOH:  (Social Determinants of Health) assessments and interventions performed: Yes    SDOH Screenings   Alcohol Screen: Low Risk  (08/30/2021)   Alcohol Screen    Last Alcohol Screening Score (AUDIT): 0  Depression (PHQ2-9): Low Risk  (02/20/2022)   Depression (PHQ2-9)    PHQ-2 Score: 0  Financial Resource Strain: Medium Risk  (08/30/2021)   Overall Financial Resource Strain (CARDIA)    Difficulty of Paying Living Expenses: Somewhat hard  Food Insecurity: Food Insecurity Present (09/22/2021)   Hunger Vital Sign    Worried About Running Out of Food in the Last  Year: Sometimes true    Ran Out of Food in the Last Year: Never true  Housing: Low Risk  (08/30/2021)   Housing    Last Housing Risk Score: 0  Physical Activity: Inactive (08/30/2021)   Exercise Vital Sign    Days of Exercise per Week: 0 days    Minutes of Exercise per Session: 0 min  Social Connections: Moderately Isolated (08/30/2021)   Social Connection and Isolation Panel [NHANES]    Frequency of Communication with Friends and Family: More than three times a week    Frequency of Social Gatherings with Friends and Family: Once a week    Attends Religious Services: Never    Marine scientist or Organizations: No    Attends Archivist Meetings: Never    Marital Status: Living with partner  Stress: No Stress Concern Present (08/30/2021)   [place]    Feeling of Stress : Not at all  Tobacco Use: Low Risk  (02/20/2022)   Patient History    Smoking Tobacco Use: Never    Smokeless Tobacco Use: Never    Passive Exposure: Not on file  Transportation Needs: No Transportation Needs (08/30/2021)   PRAPARE - Transportation    Lack of Transportation (Medical): No    Lack of Transportation (Non-Medical): No    CCM Care Plan  Allergies  Allergen Reactions   Codeine Other (See Comments)    "TRIPPED OUT"  DIDN'T LIKE THE WAY IT FELT   Atorvastatin     muscle pain   Hydrocodone     itching   Tramadol    Latex Rash   Zolpidem Other (See Comments)    Sleep walk    Medications Reviewed Today     Reviewed by Delsa Grana, PA-C (Physician Assistant Certified) on 24/09/73 at [place] List Status: <None>   Medication Order Taking? Sig Documenting Provider Last Dose  Status Informant  ACCU-CHEK SOFTCLIX LANCETS lancets [id number] Yes  [provider] Taking Active Self  acetaminophen (TYLENOL 8 HOUR) 650 MG CR tablet [id number] Yes Take 1 tablet (650 mg total) by mouth every 8 (eight) hours as needed for pain. Steele Sizer, MD Taking Active Self  albuterol (VENTOLIN HFA) 108 (90 Base) MCG/ACT inhaler [id number] Yes Inhale 2 puffs into the lungs every 6 (six) hours as needed for wheezing or shortness of breath. Flora Lipps, MD Taking Active   Alcohol Swabs (B-D SINGLE USE SWABS REGULAR) PADS [id number] Yes  [provider] Taking Active Self  amLODipine (NORVASC) 5 MG tablet [id number] Yes Take 1 tablet (5 mg total) by mouth daily. Steele Sizer, MD Taking Active   ASPIRIN LOW DOSE 81 MG EC tablet [id number] Yes TAKE 1 TABLET (81 MG TOTAL) BY MOUTH DAILY. Steele Sizer, MD Taking Active Self  benzonatate (TESSALON) 100 MG capsule [id number]  Take 1-2 capsules (100-200 mg total) by mouth 3 (three) times daily as needed for cough. Delsa Grana, PA-C  Active   Blood Glucose Monitoring Suppl (ACCU-CHEK AVIVA PLUS) w/Device Drucie Opitz [id number] Yes  [provider] Taking Active Self           Med Note Marcello Moores, CONNELLAE H   Wed Apr 02, 2019  7:13 PM)    Budeson-Glycopyrrol-Formoterol (BREZTRI AEROSPHERE) 160-9-4.8 MCG/ACT Hollie Salk [id number] Yes Inhale 2 puffs into the lungs in the morning and at bedtime. Flora Lipps, MD Taking Active   Budeson-Glycopyrrol-Formoterol [facility] AEROSPHERE) 160-9-4.8 MCG/ACT Hollie Salk [id number] Yes Inhale 2 puffs into the lungs  in the morning and at bedtime. Flora Lipps, MD Taking Active   buPROPion (WELLBUTRIN XL) 150 MG 24 hr tablet [id number] Yes Take 1 tablet (150 mg total) by mouth daily. Steele Sizer, MD Taking Active   Cholecalciferol (VITAMIN D3) 25 MCG (1000 UT) CAPS [id number] Yes Take 1,000 Units by mouth. [provider] Taking Active   Cyanocobalamin (VITAMIN B12 PO) [id number] Yes Take 1 tablet by mouth  daily.  [provider] Taking Active Self  dapagliflozin propanediol (FARXIGA) 10 MG TABS tablet [id number] Yes Take 1 tablet by mouth in the morning. [provider] Taking Active            Med Note Clemetine Marker D   Tue Aug 30, 2021  3:47 PM) Too expensive   ezetimibe (ZETIA) 10 MG tablet [id number] Yes Take 1 tablet (10 mg total) by mouth daily. Steele Sizer, MD Taking Active   furosemide (LASIX) 20 MG tablet [id number] Yes Take 1 tablet by mouth daily. Teodora Medici, DO Taking Active   insulin NPH-regular Human (70-30) 100 UNIT/ML injection [id number] Yes Inject 100 Units into the skin daily with supper. Lonia Farber, MD Taking Active Self           Med Note Ina Homes Aug 26, 2020  3:43 PM) Pt only taking once daily at night   irbesartan-hydrochlorothiazide (AVALIDE) 300-12.5 MG tablet [id number] Yes Take 1 tablet by mouth daily. Steele Sizer, MD Taking Active   levocetirizine (XYZAL) 5 MG tablet [id number]  Take 1 tablet (5 mg total) by mouth every evening. Delsa Grana, PA-C  Active   levothyroxine (SYNTHROID) 25 MCG tablet [id number] Yes Take 1 tablet by mouth daily except, take 1 and 1/2 tablets by mouth on Sunday. Steele Sizer, MD Taking Active   metFORMIN (GLUCOPHAGE) 500 MG tablet [id number] Yes Take 1,000 mg by mouth daily with breakfast. [provider] Taking Active   metoprolol tartrate (LOPRESSOR) 25 MG tablet [id number] Yes Take 1 tablet (25 mg total) by mouth 2 (two) times daily. Steele Sizer, MD Taking Active   Misc Natural Products (OSTEO BI-FLEX ADV JOINT SHIELD PO) [id number] Yes Take by mouth 2 (two) times daily.  [provider] Taking Active Self  modafinil (PROVIGIL) 100 MG tablet [id number] Yes Take 1 tablet by mouth daily. Steele Sizer, MD Taking Active   montelukast (SINGULAIR) 10 MG tablet [id number]  Take 1 tablet (10 mg total) by mouth at bedtime. Delsa Grana, PA-C  Active   omeprazole (PRILOSEC) 40  MG capsule [id number] Yes Take 1 capsule (40 mg total) by mouth daily. Steele Sizer, MD Taking Active   Potassium 99 MG TABS [id number] Yes Take by mouth daily at 6 (six) AM. [provider] Taking Active   predniSONE (DELTASONE) 20 MG tablet [id number] Yes Take 2 tablets (40 mg total) by mouth daily with breakfast for 5 days. Delsa Grana, PA-C  Active   pregabalin (LYRICA) 300 MG capsule [id number] Yes Take 300 mg by mouth 2 (two) times a day. [provider] Taking Active Self  Probiotic Product (PROBIOTIC-10 PO) [id number] Yes Take by mouth. [provider] Taking Active Self  RELION INSULIN SYRINGE 1ML/31G 31G X 5/16" 1 ML [place] [id number] Yes  [provider] Taking Active Self           Med Note Arby Barrette   Wed Apr 02, 2019  7:14 PM)    rosuvastatin (CRESTOR) 40 MG tablet [id number] Yes Take 1 tablet (40 mg total) by  mouth daily. Steele Sizer, MD Taking Active   sertraline (ZOLOFT) 100 MG tablet [id number] Yes Take 1.5 tablets (150 mg total) by mouth daily. Steele Sizer, MD Taking Active   traZODone (DESYREL) 100 MG tablet [id number] Yes Take 1 tablet (100 mg total) by mouth at bedtime as needed for sleep. Steele Sizer, MD Taking Active   VASCEPA 1 g capsule [id number] Yes Take 2 capsules (2 g total) by mouth 2 (two) times daily. Steele Sizer, MD Taking Active             Patient Active Problem List   Diagnosis Date Noted   Chronic obstructive pulmonary disease ([place]) 01/28/2021   Atherosclerosis of native arteries of the extremities with ulceration ([place]) 12/03/2020   Diabetes mellitus ([place]) 05/24/2020   B12 deficiency 05/24/2020   Vitamin D deficiency 05/24/2020   Metatarsalgia of left foot 01/15/2020   Major depressive disorder, recurrent episode, moderate ([name]) 04/04/2019   Left arm weakness 04/03/2019   (HFpEF) heart failure with preserved ejection fraction ([place]) 08/31/2017   Moderate persistent asthma 08/31/2017    Hyperlipidemia associated with type 2 diabetes mellitus ([place]) 06/14/2017   Dyslipidemia associated with type 2 diabetes mellitus ([place]) 08/18/2016   Charcot foot due to diabetes mellitus ([place]) 02/22/2016   Acquired abduction deformity of foot 07/12/2015   Osteoarthritis of subtalar joint 07/12/2015   Poorly controlled type 2 diabetes mellitus with neuropathy ([place]) 07/12/2015   Arthritis of foot, degenerative 07/12/2015   Carpal tunnel syndrome 04/17/2015   Chronic constipation 04/17/2015   Insomnia, persistent 04/17/2015   Stage 3 chronic kidney disease ([place]) 04/17/2015   Decreased exercise tolerance 04/17/2015   Diabetes mellitus with polyneuropathy ([place]) 04/17/2015   Gastro-esophageal reflux disease without esophagitis 04/17/2015   Bursitis, trochanteric 04/17/2015   Cephalalgia 04/17/2015   Hypertension associated with diabetes ([place]) 04/17/2015   Adult hypothyroidism 04/17/2015   Hearing loss 04/17/2015   Chronic recurrent major depressive disorder ([place]) 04/17/2015   Neurogenic claudication 04/17/2015   Class 3 severe obesity with serious comorbidity and body mass index (BMI) of 45.0 to 49.9 in adult ([place]) 04/17/2015   Hypo-ovarianism 04/17/2015   Perennial allergic rhinitis with seasonal variation 04/17/2015   Acne erythematosa 04/17/2015   Dyskinesia, tardive 04/17/2015   Memory loss 04/17/2015   Impingement syndrome of shoulder 04/17/2015   Dermatitis, stasis 04/17/2015   Obstructive sleep apnea 05/14/2014   Shortness of breath on exertion 05/06/2014   Mixed hyperlipidemia 02/06/2012   LBP (low back pain) 09/16/2008    Immunization History  Administered Date(s) Administered   Fluad Quad(high Dose 65+) 06/03/2019, 06/18/2020, 05/31/2021   Influenza Split 06/13/2013, 06/16/2014   Influenza, High Dose Seasonal PF 05/24/2015, 06/27/2017, 05/27/2018   Influenza, Seasonal, Injecte, Preservative Fre 06/03/2010, 07/25/2011   Influenza-Unspecified 07/21/2016   PFIZER(Purple  Top)SARS-COV-2 Vaccination 11/07/2019, 12/02/2019   Pneumococcal Conjugate-13 07/14/2013   Pneumococcal Polysaccharide-23 10/01/2006, 06/16/2014   Tdap 03/04/2010, 07/20/2015   Zoster, Live 08/12/2012    Conditions to be addressed/monitored:  Hypertension, Hyperlipidemia, Diabetes, Coronary Artery Disease, Asthma, Chronic Kidney Disease, Hypothyroidism, Osteoarthritis, and Insomnia  There are no care plans that you recently modified to display for this patient.    Medication Assistance: None required.  Patient affirms current coverage meets needs.  Compliance/Adherence/Medication fill history: Care Gaps: Shingrix Covid Booster  Star-Rating Drugs: NA  Patient's preferred pharmacy is:  [facility] [employer], [address] [place] [address] Phone: [phone] Fax: [phone]  [facility],  [place] [address] [place] [address] Phone: [phone] Fax: [phone]   Uses pill box? No - Utilizes [employer] Pillpack Pt endorses 100% compliance  We discussed: Current pharmacy is preferred with insurance plan and patient is satisfied with pharmacy services Patient decided to: Continue current medication management strategy  Care Plan and Follow Up Patient Decision:  Patient agrees to Care Plan and Follow-up.  Plan: Telephone follow up appointment with care management team member scheduled for:  02/22/2022 at 3:00 PM  Doristine Section, Para March, [place] Pharmacist Practitioner  [facility] [phone]  Current Barriers:  Suboptimal therapeutic regimen for hypertension Suboptimal therapeutic regimen for GERD  Pharmacist Clinical Goal(s):  Patient will maintain control of diabetes as evidenced by A1c less than 8%  maintain control of blood pressure as evidenced by BP less than 140/90  through collaboration with PharmD and provider.    Interventions: 1:1 collaboration with Steele Sizer, MD regarding development and update of comprehensive plan of care as evidenced by provider attestation and co-signature Inter-disciplinary care team collaboration (see longitudinal plan of care) Comprehensive medication review performed; medication list updated in electronic medical record  Hypertension (BP goal <140/90) -Controlled -Current treatment: Amlodipine 5 mg daily  Furosemide 20 mg daily  Irbesartan-HCTZ 300-12.5 mg daily  Metoprolol tartrate 25 mg twice daily  -Medications previously tried: NA  -Current home readings: NA -Current dietary habits: 10-15 glasses water/diet mountain dew. -Current exercise habits: Minimal exercise, limited by dyspnea   -Denies hypotensive/hypertensive symptoms -Continue current medications  Hyperlipidemia: (LDL goal < 70) -Controlled -Current treatment: Ezetimibe 10 mg daily  Rosuvastatin 40 mg daily  Vascepa 2 g twice daily  -Current antiplatelet treatment: Aspirin 81 mg daily  -Medications previously tried: NA -Recommended to continue current medication  Diabetes (A1c goal <8%) -Controlled -Managed by Dr. Honor Junes  -Diagnosed 2012  -Current medications: Farxiga 10 mg daily  NPH 70/30 100 units with supper  Metformin 500 mg 2 tablets daily daily  -Medications previously tried: NA  -Current home glucose readings fasting glucose: 80-130  post prandial glucose: NA -Reports hypoglycemic/hyperglycemic symptoms: two instances of hypoglycemia overnight.  -Counseled to take Insulin 15-30 minutes prior to meals  -Recommended to continue current medication  COPD (Goal: control symptoms and prevent exacerbations) -Controlled -Current treatment  Albuterol HFA 2 puffs every 6 hours as needed  Montelukast 10 mg nightly  -Medications previously tried: NA  -MMRC/CAT score: Short of breath walking from front door to car. Improves with rest after 5 minutes.  -Pulmonary function  testing: FEV1/FVC 81%, FEV1 89% (2015)  -Exacerbations requiring treatment in last 6 months: Yes 02/20/22.  -Patient did not notice any improvement when using inhalers.  -Recommended to continue current medication  Depression/Anxiety (Goal: Maintain stable mood) -Controlled -Current treatment: Wellbutrin XL 150 mg daily  Modafinil 100 mg daily  Sertraline 100 mg 1.5 tablets daily  Trazodone 100 mg nightly as needed (takes nightly)  -Medications previously tried/failed: NA -Recommended to continue current medication  Hypothyroidism (Goal: Maintain stable thyroid function) -Controlled -Current treatment  Levothyroxine 25 mcg 1 tablet daily, 1.5 tablets on sundays  -Medications previously tried: NA  -Recommended to continue current medication  Osteoarthritis (Goal: Improve symptom control) -Controlled -Current treatment  Acetaminophen CR 650 mg twice daily  Osteo-Bi Flex  -Medications previously tried: NA -Has been taking osteo Bi-flex supplement for ~ 1 year with no noticeable improvement.  -Counseled on appropriate use of acetminophen for osteoarthritis and counseled to avoid doses greater than 3000  mg daily   -Recommended STOPPING Osteo Bi-Flex (ineffective)  GERD (Goal: Prevent Heartburn/Reflux) -Not ideally controlled -Current treatment  Omeprazole 40 mg daily (HS)  -Medications previously tried: Ranitidine  -Recommended to continue current medication  Chronic Kidney Disease Stage 3a  -All medications assessed for renal dosing and appropriateness in chronic kidney disease. -Rosuvastatin: Acceptable in eGFR >30. Could consider switching to atorvastatin if renal function declines  -Metformin dose appropriate given current kidney function, but will require frequent monitoring to ensure stable kidney function.  -Recommended to continue current medication  Patient Goals/Self-Care Activities Patient will:  - check glucose daily, document, and provide at future  appointments check blood pressure weekly, document, and provide at future appointments  Follow Up Plan: Telephone follow up appointment with care management team member scheduled for:  02/22/2022 at 3:00 PM

## 2022-02-24 ENCOUNTER — Ambulatory Visit (INDEPENDENT_AMBULATORY_CARE_PROVIDER_SITE_OTHER): Payer: No Typology Code available for payment source | Admitting: Medical

## 2022-02-24 ENCOUNTER — Encounter: Payer: Self-pay | Admitting: Medical

## 2022-02-24 VITALS — BP 100/58 | HR 56 | Ht 62.0 in | Wt 242.0 lb

## 2022-02-24 DIAGNOSIS — I10 Essential (primary) hypertension: Secondary | ICD-10-CM | POA: Diagnosis not present

## 2022-02-24 DIAGNOSIS — R0609 Other forms of dyspnea: Secondary | ICD-10-CM | POA: Diagnosis not present

## 2022-02-24 DIAGNOSIS — I517 Cardiomegaly: Secondary | ICD-10-CM

## 2022-02-24 DIAGNOSIS — E782 Mixed hyperlipidemia: Secondary | ICD-10-CM | POA: Diagnosis not present

## 2022-02-24 DIAGNOSIS — I7025 Atherosclerosis of native arteries of other extremities with ulceration: Secondary | ICD-10-CM

## 2022-02-24 NOTE — Patient Instructions (Signed)
Medication Instructions:   Your physician recommends that you continue on your current medications as directed. Please refer to the Current Medication list given to you today.   *If you need a refill on your cardiac medications before your next appointment, please call your pharmacy*   Lab Work: None ordered  If you have labs (blood work) drawn today and your tests are completely normal, you will receive your results only by: MyChart Message (if you have MyChart) OR A paper copy in the mail If you have any lab test that is abnormal or we need to change your treatment, we will call you to review the results.   Testing/Procedures: None ordered   Follow-Up: At [facility], you and your health needs are our priority.  As part of our continuing mission to provide you with exceptional heart care, we have created designated Provider Care Teams.  These Care Teams include your primary Cardiologist (physician) and Advanced Practice Providers (APPs -  Physician Assistants and Nurse Practitioners) who all work together to provide you with the care you need, when you need it.  We recommend signing up for the patient portal called "MyChart".  Sign up information is provided on this After Visit Summary.  MyChart is used to connect with patients for Virtual Visits (Telemedicine).  Patients are able to view lab/test results, encounter notes, upcoming appointments, etc.  Non-urgent messages can be sent to your provider as well.   To learn more about what you can do with MyChart, go to [email or web address].    Your next appointment:    Your physician wants you to follow-up in: 1 year.   You will receive a reminder letter in the mail two months in advance. If you don't receive a letter, please call our office to schedule the follow-up appointment.   The format for your next appointment:   In Person  Provider:   You may see Christopher End, MD or one of the following Advanced Practice Providers  on your designated Care Team:   Christopher Berge, NP Ryan Dunn, PA-C Cadence Furth, PA-C   Other Instructions N/A  Important Information About Sugar       

## 2022-02-24 NOTE — Progress Notes (Signed)
Cardiology Office Note:    Date:  02/24/2022   ID:  Summer [name], DOB 14-Sep-1950, MRN [id number]  PCP:  Steele Sizer, MD  [facility] Cardiologist:  None  [facility] Electrophysiologist:  None   Referring MD: Steele Sizer, MD   Chief Complaint: 1 year follow-up  History of Present Illness:    Summer [name] is a 71 y.o. female with a hx of DM 2, asthma, OSA, morbid obesity  who presents for follow-up.   Establish with Dr. Saunders Revel 07/2019 for preoperative evaluation in anticipation bariatric surgery.  She had longstanding exertional dyspnea with minimal activity.  Echocardiogram with normal LVEF.  Due to EKG with septal Q waves pharmacological myocardial PET/CT at [name] was performed which is normal showing only mild coronary artery calcification.  When last seen 02/11/2020 she reported chronic exertional dyspnea at her baseline with no chest pain, palpitations.  She was still awaiting clearance due to financial constraints for bariatric surgery.   She had chest x-ray 08/17/2020 at outside facility with findings indicating borderline cardiomegaly, bibasilar peribronchial changes.  She was treated with albuterol and azithromycin.  Last seen 08/27/20 and LVH and CXR were discussed. No changes were made.   Today, the patient reports a bad cough that it has affected her voice. Her ribs hurt and her muscles hurt from coughing. She is seeing PCP for this. Cough is getting better. Has some dull chest pressure and sharp pain that is chronic, not related to the coughing. She has chronic shortness of breath that is unchanged. No LLE, orthopnea, pnd. No palpitations. OSA not on CPAP, she is not interested in re-visiting this.   Past Medical History:  Diagnosis Date   Anemia    Arthritis    Asthma    B12 deficiency    Back pain    Carpal tunnel syndrome    Chronic kidney disease    Constipation    Depression    Depressive disorder    Diabetes mellitus    Dyspnea    Fluid retention    Foot  pain    GERD (gastroesophageal reflux disease)    Headache    History of hiatal hernia    Hyperlipidemia    Hypertension    IBS (irritable bowel syndrome)    Insomnia    Joint pain    Lumbago    Memory loss    Obesity    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)    Pneumonia    Rhinitis, allergic    Rosacea    Sleep apnea    SOB (shortness of breath)    Thyroid disease    Unspecified hearing loss    Unspecified hereditary and idiopathic peripheral neuropathy    Unspecified sleep apnea    Vitamin D deficiency     Past Surgical History:  Procedure Laterality Date   ABDOMINAL HYSTERECTOMY  1975   ANKLE SURGERY Left approx Jan 2018   APPENDECTOMY  1970   CATARACT EXTRACTION  01/2011   right   COLONOSCOPY WITH PROPOFOL N/A 08/19/2019   Procedure: COLONOSCOPY WITH PROPOFOL;  Surgeon: Jonathon Bellows, MD;  Location: [facility] ENDOSCOPY;  Service: Gastroenterology;  Laterality: N/A;   eye lid surgery  2013   bilateral   FOOT SURGERY     LOWER EXTREMITY ANGIOGRAPHY Left 11/23/2020   Procedure: LOWER EXTREMITY ANGIOGRAPHY;  Surgeon: Katha Cabal, MD;  Location: [facility];  Service: Cardiovascular;  Laterality: Left;   NECK SURGERY     SPINE SURGERY  TUBAL LIGATION     VAGINAL HYSTERECTOMY  1989    Current Medications: Current Meds  Medication Sig   ACCU-CHEK SOFTCLIX LANCETS lancets    acetaminophen (TYLENOL 8 HOUR) 650 MG CR tablet Take 1 tablet (650 mg total) by mouth every 8 (eight) hours as needed for pain.   albuterol (VENTOLIN HFA) 108 (90 Base) MCG/ACT inhaler Inhale 2 puffs into the lungs every 6 (six) hours as needed for wheezing or shortness of breath.   Alcohol Swabs (B-D SINGLE USE SWABS REGULAR) PADS    amLODipine (NORVASC) 5 MG tablet Take 1 tablet (5 mg total) by mouth daily.   ASPIRIN LOW DOSE 81 MG EC tablet TAKE 1 TABLET (81 MG TOTAL) BY MOUTH DAILY.   benzonatate (TESSALON) 100 MG capsule Take 1-2 capsules (100-200 mg total) by mouth 3 (three) times daily as  needed for cough.   Blood Glucose Monitoring Suppl (ACCU-CHEK AVIVA PLUS) w/Device KIT    Budeson-Glycopyrrol-Formoterol (BREZTRI AEROSPHERE) 160-9-4.8 MCG/ACT AERO Inhale 2 puffs into the lungs in the morning and at bedtime.   buPROPion (WELLBUTRIN XL) 150 MG 24 hr tablet Take 1 tablet (150 mg total) by mouth daily.   Cholecalciferol (VITAMIN D3) 25 MCG (1000 UT) CAPS Take 1,000 Units by mouth.   Cyanocobalamin (VITAMIN B12 PO) Take 1 tablet by mouth daily.    dapagliflozin propanediol (FARXIGA) 10 MG TABS tablet Take 1 tablet by mouth in the morning.   ezetimibe (ZETIA) 10 MG tablet Take 1 tablet (10 mg total) by mouth daily.   furosemide (LASIX) 20 MG tablet Take 1 tablet by mouth daily.   insulin NPH-regular Human (70-30) 100 UNIT/ML injection Inject 100 Units into the skin daily with supper.   irbesartan-hydrochlorothiazide (AVALIDE) 300-12.5 MG tablet Take 1 tablet by mouth daily.   levothyroxine (SYNTHROID) 25 MCG tablet Take 1 tablet by mouth daily except, take 1 and 1/2 tablets by mouth on Sunday.   metFORMIN (GLUCOPHAGE) 500 MG tablet Take 1,000 mg by mouth daily with breakfast.   metoprolol tartrate (LOPRESSOR) 25 MG tablet Take 1 tablet (25 mg total) by mouth 2 (two) times daily.   Misc Natural Products (OSTEO BI-FLEX ADV JOINT SHIELD PO) Take by mouth 2 (two) times daily.    modafinil (PROVIGIL) 100 MG tablet Take 1 tablet by mouth daily.   montelukast (SINGULAIR) 10 MG tablet Take 1 tablet (10 mg total) by mouth at bedtime.   omeprazole (PRILOSEC) 40 MG capsule Take 1 capsule (40 mg total) by mouth daily.   Potassium 99 MG TABS Take by mouth daily at 6 (six) AM.   predniSONE (DELTASONE) 20 MG tablet Take 2 tablets (40 mg total) by mouth daily with breakfast for 5 days.   pregabalin (LYRICA) 300 MG capsule Take 300 mg by mouth 2 (two) times a day.   Probiotic Product (PROBIOTIC-10 PO) Take by mouth.   RELION INSULIN SYRINGE 1ML/31G 31G X 5/16" 1 ML MISC    rosuvastatin (CRESTOR)  40 MG tablet Take 1 tablet (40 mg total) by mouth daily.   sertraline (ZOLOFT) 100 MG tablet Take 1.5 tablets (150 mg total) by mouth daily.   traZODone (DESYREL) 100 MG tablet Take 1 tablet (100 mg total) by mouth at bedtime as needed for sleep.   VASCEPA 1 g capsule Take 2 capsules (2 g total) by mouth 2 (two) times daily.     Allergies:   Codeine, Atorvastatin, Hydrocodone, Tramadol, Latex, and Zolpidem   Social History   Socioeconomic History   Marital  status: Significant Other    Spouse name: [name] Rump   Number of children: 1   Years of education: Not on file   Highest education level: High school graduate  Occupational History   Occupation: retired  Tobacco Use   Smoking status: Never   Smokeless tobacco: Never  Vaping Use   Vaping Use: Never used  Substance and Sexual Activity   Alcohol use: No    Alcohol/week: 0.0 standard drinks of alcohol   Drug use: No   Sexual activity: Yes    Partners: Male  Other Topics Concern   Not on file  Social History Narrative   Son lives in [place] (retired from [employer]) and her grand[place] (36yr old[place]   Social Determinants of Health   Financial Resource Strain: [place](08/30/2021)   Overall Financial Resource Strain (CARDIA)    Difficulty of Paying Living Expenses: Somewhat hard  Food Insecurity: Food Insecurity Present (09/22/2021)   Hunger Vital Sign    Worried About [place] the Last Year: Sometimes true    Ran Out of Food in the Last Year: Never true  Transportation Needs: No Transportation Needs (08/30/2021)   PRAPARE - THydrologist(Medical): No    Lack of Transportation (Non-Medical): No  Physical Activity: Inactive (08/30/2021)   Exercise Vital Sign    Days of Exercise per Week: 0 days    Minutes of Exercise per Session: 0 min  Stress: No Stress Concern Present (08/30/2021)   [place]   Feeling of Stress : Not at all  Social Connections: Moderately Isolated (08/30/2021)   Social Connection and Isolation Panel [NHANES]    Frequency of Communication with Friends and Family: More than three times a week    Frequency of Social Gatherings with Friends and Family: Once a week    Attends Religious Services: Never    AMarine scientistor Organizations: No    Attends CMusic therapist Never    Marital Status: Living with partner     Family History: The patient's family history includes Aneurysm in her mother; Aortic aneurysm in her mother; Diabetes in her maternal grandfather; Heart attack in her maternal grandfather; Heart disease in her mother; Hyperlipidemia in her mother; Hypertension in her mother; Obesity in her mother.  ROS:   Please see the history of present illness.     All other systems reviewed and are negative.  EKGs/Labs/Other Studies Reviewed:    The following studies were reviewed today:  Echo 04/03/19  1. The left ventricle has hyperdynamic systolic function, with an  ejection fraction of >65%. The cavity size was normal. There is mildly  increased left ventricular wall thickness. Left ventricular diastolic  parameters were normal. No evidence of left  ventricular regional wall motion abnormalities.   2. The right ventricle has normal systolic function. The cavity was  normal. There is no increase in right ventricular wall thickness. Right  ventricular systolic pressure could not be assessed.   3. The mitral valve is degenerative. Mild thickening of the mitral valve  leaflet. There is moderate mitral annular calcification present.   4. The aortic valve has an indeterminate number of cusps.   5. The aorta is normal in size and structure.   6. The interatrial septum was not well visualized.   EKG:  EKG is  ordered today.  The ekg ordered today  demonstrates SB 56bpm, nonspecific T wave changes  Recent Labs: 10/03/2021:  ALT 12; TSH 1.43  Recent Lipid Panel    Component Value Date/Time   CHOL 131 10/03/2021 1439   CHOL 209 (H) 05/13/2020 1146   TRIG 217 (H) 10/03/2021 1439   HDL 38 (L) 10/03/2021 1439   HDL 38 (L) 05/13/2020 1146   CHOLHDL 3.4 10/03/2021 1439   VLDL 40 04/02/2019 1743   LDLCALC 64 10/03/2021 1439     Physical Exam:    VS:  BP (!) 100/58 (BP Location: Left Arm, Patient Position: Sitting, Cuff Size: Large)   Pulse (!) 56   Ht _0  (1.575 m)   Wt 242 lb (109.8 kg)   BMI 44.26 kg/m     Wt Readings from Last 3 Encounters:  02/24/22 242 lb (109.8 kg)  02/20/22 245 lb 11.2 oz (111.4 kg)  10/19/21 242 lb 6.4 oz (110 kg)     GEN:  Well nourished, well developed in no acute distress HEENT: Normal NECK: No JVD; No carotid bruits LYMPHATICS: No lymphadenopathy CARDIAC: bradycardic, RR, no murmurs, rubs, gallops RESPIRATORY:  Clear to auscultation without rales, wheezing or rhonchi  ABDOMEN: Soft, non-tender, non-distended MUSCULOSKELETAL:  No edema; No deformity  SKIN: Warm and dry NEUROLOGIC:  Alert and oriented x 3 PSYCHIATRIC:  Normal affect   ASSESSMENT:    1. Dyspnea on exertion   2. Atherosclerosis of native arteries of the extremities with ulceration ([place])   3. Essential hypertension   4. Mild concentric left ventricular hypertrophy (LVH)   5. Morbid obesity ([place])   6. Hyperlipidemia, mixed    PLAN:    In order of problems listed above:  DOE She has chronic DOE from untreated OSA, obesity, general deconditioning, and asthma. Breathing is unchanged. She is euvolemic on exam. PCP checks annual labs. Echo from 2020 showed normal LVEF with no diastolic dysfunction.   HTN BP soft at 100/58. Continue amlodipine 40m daily, Irbesartan-HCTZ 300-12.560mdaily and Lopressor 5030mD.   Mild LVH Echo from 2020 showed mild LVH. BP well controlled. No reason to repeat echo at this time.   Morbid obesity Weight loss recommended.   HLD LDL 64 09/2021. Continue Vascepa,  Crestor and Zetia.  Disposition: Follow up in 1 year(s) with MD/APP      Signed, Lyrica Mcclarty H FNinfa MeekerA-C  02/24/2022 2:19 PM    [name]

## 2022-03-13 ENCOUNTER — Other Ambulatory Visit: Payer: Self-pay

## 2022-03-13 DIAGNOSIS — E1169 Type 2 diabetes mellitus with other specified complication: Secondary | ICD-10-CM

## 2022-03-13 MED ORDER — ROSUVASTATIN CALCIUM 40 MG PO TABS: 40.0000 mg | ORAL_TABLET | Freq: Every day | ORAL | 1 refills | Status: DC

## 2022-03-13 MED ORDER — IRBESARTAN-HYDROCHLOROTHIAZIDE 300-12.5 MG PO TABS: 1.0000 | ORAL_TABLET | Freq: Every day | ORAL | 1 refills | Status: DC

## 2022-03-15 ENCOUNTER — Telehealth: Payer: Self-pay

## 2022-03-15 NOTE — Progress Notes (Signed)
Chronic Care Management Pharmacy Assistant   Name: Summer [name]  MRN: [id number] DOB: November 02, 1950  Reason for Encounter:Diabetes Disease State Call.   Recent office visits:  02/20/2022 Delsa Grana PA-C (PCP Office) Start azithromycin 250 mg , start Prednisone 20 mg daily, Change Benzonatate to 100-200 mg 3 times daily PRN  Recent consult visits:  02/24/2022 Cadence Jorene Minors (Cardiology) No Medication Changes noted  Hospital visits:  None in previous 6 months  Medications: Outpatient Encounter Medications as of 03/15/2022  Medication Sig Note   ACCU-CHEK SOFTCLIX LANCETS lancets     acetaminophen (TYLENOL 8 HOUR) 650 MG CR tablet Take 1 tablet (650 mg total) by mouth every 8 (eight) hours as needed for pain.    albuterol (VENTOLIN HFA) 108 (90 Base) MCG/ACT inhaler Inhale 2 puffs into the lungs every 6 (six) hours as needed for wheezing or shortness of breath.    Alcohol Swabs (B-D SINGLE USE SWABS REGULAR) PADS     amLODipine (NORVASC) 5 MG tablet Take 1 tablet (5 mg total) by mouth daily.    ASPIRIN LOW DOSE 81 MG EC tablet TAKE 1 TABLET (81 MG TOTAL) BY MOUTH DAILY.    benzonatate (TESSALON) 100 MG capsule Take 1-2 capsules (100-200 mg total) by mouth 3 (three) times daily as needed for cough.    Blood Glucose Monitoring Suppl (ACCU-CHEK AVIVA PLUS) w/Device KIT     Budeson-Glycopyrrol-Formoterol (BREZTRI AEROSPHERE) 160-9-4.8 MCG/ACT AERO Inhale 2 puffs into the lungs in the morning and at bedtime.    buPROPion (WELLBUTRIN XL) 150 MG 24 hr tablet Take 1 tablet (150 mg total) by mouth daily.    Cholecalciferol (VITAMIN D3) 25 MCG (1000 UT) CAPS Take 1,000 Units by mouth.    Cyanocobalamin (VITAMIN B12 PO) Take 1 tablet by mouth daily.     dapagliflozin propanediol (FARXIGA) 10 MG TABS tablet Take 1 tablet by mouth in the morning. 08/30/2021: Too expensive    ezetimibe (ZETIA) 10 MG tablet Take 1 tablet (10 mg total) by mouth daily.    furosemide (LASIX) 20 MG tablet Take 1  tablet by mouth daily.    insulin NPH-regular Human (70-30) 100 UNIT/ML injection Inject 100 Units into the skin daily with supper. 08/26/2020: Pt only taking once daily at night    irbesartan-hydrochlorothiazide (AVALIDE) 300-12.5 MG tablet Take 1 tablet by mouth daily.    levothyroxine (SYNTHROID) 25 MCG tablet Take 1 tablet by mouth daily except, take 1 and 1/2 tablets by mouth on Sunday.    metFORMIN (GLUCOPHAGE) 500 MG tablet Take 1,000 mg by mouth daily with breakfast.    metoprolol tartrate (LOPRESSOR) 25 MG tablet Take 1 tablet (25 mg total) by mouth 2 (two) times daily.    Misc Natural Products (OSTEO BI-FLEX ADV JOINT SHIELD PO) Take by mouth 2 (two) times daily.     modafinil (PROVIGIL) 100 MG tablet Take 1 tablet by mouth daily.    montelukast (SINGULAIR) 10 MG tablet Take 1 tablet (10 mg total) by mouth at bedtime.    omeprazole (PRILOSEC) 40 MG capsule Take 1 capsule (40 mg total) by mouth daily.    Potassium 99 MG TABS Take by mouth daily at 6 (six) AM.    pregabalin (LYRICA) 300 MG capsule Take 300 mg by mouth 2 (two) times a day.    Probiotic Product (PROBIOTIC-10 PO) Take by mouth.    RELION INSULIN SYRINGE 1ML/31G 31G X 5/16" 1 ML MISC     rosuvastatin (CRESTOR) 40 MG tablet Take  1 tablet (40 mg total) by mouth daily.    sertraline (ZOLOFT) 100 MG tablet Take 1.5 tablets (150 mg total) by mouth daily.    traZODone (DESYREL) 100 MG tablet Take 1 tablet (100 mg total) by mouth at bedtime as needed for sleep.    VASCEPA 1 g capsule Take 2 capsules (2 g total) by mouth 2 (two) times daily.    No facility-administered encounter medications on file as of 03/15/2022.    Care Gaps: Hemoglobin A1C COIVD-19 Vaccine   Star Rating Drugs: Irbesartan-HTCZ 300-12.5 mg last filled in 02/13/2022 for 30 day supply at [facility]. Metformin 500 mg last filled in 02/13/2022 for 30 day supply at [facility]. Rosuvastatin 20 mg last filled in 02/13/2022 for 30 day supply at [facility].   Medication Fill Gaps: None  Recent Relevant Labs: Lab Results  Component Value Date/Time   HGBA1C 7.8 (A) 05/31/2021 01:49 PM   HGBA1C 7.1 10/07/2020 12:00 AM   HGBA1C 6.7 (H) 05/13/2020 11:46 AM   HGBA1C 6.7 (A) 03/12/2020 11:37 AM   HGBA1C 7.2 (H) 04/02/2019 05:43 PM   HGBA1C 8.1 08/27/2018 02:56 PM   HGBA1C 8.1 (A) 08/27/2018 02:56 PM   HGBA1C 8.1 (A) 08/27/2018 02:56 PM   MICROALBUR 0.6 01/28/2018 03:52 PM   MICROALBUR 20 02/19/2017 10:40 AM   MICROALBUR 20 07/22/2015 02:05 PM    Kidney Function Lab Results  Component Value Date/Time   CREATININE 1.33 (H) 01/28/2021 03:00 PM   CREATININE 1.21 (H) 11/23/2020 09:37 AM   CREATININE 1.50 (H) 09/24/2020 01:03 PM   CREATININE 1.42 (H) 10/14/2019 11:42 AM   GFRNONAA 40 (L) 01/28/2021 03:00 PM   GFRAA 46 (L) 01/28/2021 03:00 PM    Current antihyperglycemic regimen:  Farxiga 10 mg daily  NPH 70/30 100 units with supper  Metformin 500 mg daily   What recent interventions/DTPs have been made to improve glycemic control:  None ID  Have there been any recent hospitalizations or ED visits since last visit with CPP? No  I have attempted without success to contact this patient by phone three times to do her Diabetes  Disease State call. I left a Voice message for patient to return my call.   Adherence Review: Is the patient currently on a STATIN medication? Yes Is the patient currently on ACE/ARB medication? Yes Does the patient have >5 day gap between last estimated fill dates? No     Anderson [place] Clinical Production designer, theatre/television/film [phone]

## 2022-03-26 ENCOUNTER — Other Ambulatory Visit: Payer: Self-pay | Admitting: Family Medicine

## 2022-03-26 ENCOUNTER — Other Ambulatory Visit: Payer: Self-pay | Admitting: Internal Medicine

## 2022-03-26 DIAGNOSIS — N1832 Chronic kidney disease, stage 3b: Secondary | ICD-10-CM

## 2022-03-26 DIAGNOSIS — I10 Essential (primary) hypertension: Secondary | ICD-10-CM

## 2022-03-28 NOTE — Telephone Encounter (Signed)
Requested medication (s) are due for refill today: yes  Requested medication (s) are on the active medication list: yes  Last refill:  12/21/21 #90 with 0 RF  Future visit scheduled: 04/04/22  Notes to clinic:  Failed protocol of labs within 12 months, has upcoming appt, please assess.       Requested Prescriptions  Pending Prescriptions Disp Refills   furosemide (LASIX) 20 MG tablet [Pharmacy Med Name: Furosemide '20mg'$  Tablet] 90 tablet 0    Sig: Take 1 tablet by mouth daily.     Cardiovascular:  Diuretics - Loop Failed - 03/26/2022  5:20 AM      Failed - K in normal range and within 180 days    Potassium  Date Value Ref Range Status  01/28/2021 4.7 3.5 - 5.3 mmol/L Final         Failed - Ca in normal range and within 180 days    Calcium  Date Value Ref Range Status  01/28/2021 10.0 8.6 - 10.4 mg/dL Final         Failed - Na in normal range and within 180 days    Sodium  Date Value Ref Range Status  01/28/2021 140 135 - 146 mmol/L Final  09/24/2020 141 134 - 144 mmol/L Final         Failed - Cr in normal range and within 180 days    Creat  Date Value Ref Range Status  01/28/2021 1.33 (H) 0.60 - 0.93 mg/dL Final    Comment:    For patients >22 years of age, the reference limit for Creatinine is approximately 13% higher for people identified as African-American. .    Creatinine, Urine  Date Value Ref Range Status  01/28/2018 41 20 - 275 mg/dL Final         Failed - Cl in normal range and within 180 days    Chloride  Date Value Ref Range Status  01/28/2021 104 98 - 110 mmol/L Final         Failed - Mg Level in normal range and within 180 days    No results found for: "MG"       Passed - Last BP in normal range    BP Readings from Last 1 Encounters:  02/24/22 (!) 100/58         Passed - Valid encounter within last 6 months    Recent Outpatient Visits           1 month ago COPD with acute exacerbation [facility])   [facility],  Kristeen Miss, PA-C   5 months ago Type 2 diabetes mellitus with diabetic polyneuropathy, with long-term current use of insulin [facility])   [facility] [place], Drue Stager, MD   10 months ago Type 2 diabetes mellitus with diabetic polyneuropathy, with long-term current use of insulin [facility])   [facility] Steele Sizer, MD   1 year ago Upper respiratory tract infection, unspecified type   [facility] Delsa Grana, PA-C   1 year ago Atherosclerosis of native arteries of the extremities with ulceration [facility])   [facility] Steele Sizer, MD       Future Appointments             In 1 week Steele Sizer, MD [facility] [place]   In 5 months  [place]

## 2022-04-03 ENCOUNTER — Ambulatory Visit: Payer: Medicare HMO | Admitting: Family Medicine

## 2022-04-03 NOTE — Progress Notes (Unsigned)
Name: Summer [name]   MRN: [id number]    DOB: 1950-11-18   Date:04/04/2022       Progress Note  Subjective  Chief Complaint  Follow Up  HPI  Hypothyroidism: she states hair loss is stable, no dysphagia, no change in bowel movements. On levothyroxine 25 mg daily and one and two on Sundays , last level was at goal at 1.43 January 2023   COPD: from second hand smoking, but  she states she has a noticed worsening of dry cough , feels like something is stuck in her throat and has to drink water constantly. It is going on for the past couple of months, she sees pulmonologist for SOB and has a visit scheduled for next visit. Advised her to call me back if he thinks not related to her lungs and I can place referral to ENT  Morbid Obesity: BMI of 45,  weight is up 2 lbs from January, she states she was trying to lose weight but gave up because it was not working. She states her significant other cooks wonderful meals, but she is also eating sweets    MDD: she is currently taking Zoloft 150 mg and Wellbutrin, she took Abilify in the past but caused tardive dyskinesia , Phq 9 is very high again, , she refuses to go back to psychiatrist.  OA knee: seen by Ortho in the past and pain is getting progressively worse, affecting her gait, she has tried injections without help. Advised her to go back since no recent visits.   Right lumbar radiculitis: . She is already taking gabapentin for neuropathy, She has a history of DDD lumbar spine, seen by Dr. Adriana Simas in the past and had procedures done. She also saw Dr. Leeanne Rio but no recent procedures. She states back pain 2/10 at this time and no radiculitis     Atherosclerosis of aorta  found on CT abdomen, she also had peripheral vascular craterization 04/25 and she has infrarenal obstruction. She is on Crestor 20 mg , Vascepa and Zetia . She cannot afford Repatha . She had intervention done back in early 22 with angiogram and claudication improved   Diabetes : she is  under the care of Endocrinologist, Dr. Gershon Crane ,lats A1C was 7.6. April 2023   She has dyslipidemia, obesity, CKI, and HTN associated with DM. She is now on Farxiga from assistance program   CKI stage III: GFR was 33 in March,she also has microalbuminuria.  She is on ARB and Farxiga , bp is controlled, seeing nephrologist - Dr. Cherylann Ratel. She has good urine output and no pruritis   Dyslipidemia: reviewed last labs, LDL at goal   OSA: she continues to wake up feeling tired, reviewed drug database and not able to fill Modafinil, likely due to cost. We will send to [facility] so she can use goodrx and pay cash for it, she is still not wearing CPAP machine, unable to tolerate it She is on 100 mg of Trazodone now. Explained the risk of respiratory suppression without wearing CPAP machine   HTN: bp at home is within normal limits, she denies chest pain or palpitation, she has SOB with activity but stable. She saw Dr. Okey Dupre for SOB, likely multifactorial Unchanged .    Patient Active Problem List   Diagnosis Date Noted   Chronic obstructive pulmonary disease ([name]) 01/28/2021   Atherosclerosis of native arteries of the extremities with ulceration ([name]) 12/03/2020   Diabetes mellitus ([name]) 05/24/2020   B12 deficiency 05/24/2020  Vitamin D deficiency 05/24/2020   Metatarsalgia of left foot 01/15/2020   Major depressive disorder, recurrent episode, moderate ([place]) 04/04/2019   Left arm weakness 04/03/2019   (HFpEF) heart failure with preserved ejection fraction ([place]) 08/31/2017   Moderate persistent asthma 08/31/2017   Hyperlipidemia associated with type 2 diabetes mellitus ([place]) 06/14/2017   Dyslipidemia associated with type 2 diabetes mellitus ([place]) 08/18/2016   Charcot foot due to diabetes mellitus ([place]) 02/22/2016   Acquired abduction deformity of foot 07/12/2015   Osteoarthritis of subtalar joint 07/12/2015   Poorly controlled type 2 diabetes mellitus with neuropathy ([place]) 07/12/2015   Arthritis of foot,  degenerative 07/12/2015   Carpal tunnel syndrome 04/17/2015   Chronic constipation 04/17/2015   Insomnia, persistent 04/17/2015   Stage 3 chronic kidney disease ([place]) 04/17/2015   Decreased exercise tolerance 04/17/2015   Diabetes mellitus with polyneuropathy ([place]) 04/17/2015   Gastro-esophageal reflux disease without esophagitis 04/17/2015   Bursitis, trochanteric 04/17/2015   Cephalalgia 04/17/2015   Hypertension associated with diabetes ([place]) 04/17/2015   Adult hypothyroidism 04/17/2015   Hearing loss 04/17/2015   Chronic recurrent major depressive disorder ([place]) 04/17/2015   Neurogenic claudication 04/17/2015   Class 3 severe obesity with serious comorbidity and body mass index (BMI) of 45.0 to 49.9 in adult ([place]) 04/17/2015   Hypo-ovarianism 04/17/2015   Perennial allergic rhinitis with seasonal variation 04/17/2015   Acne erythematosa 04/17/2015   Dyskinesia, tardive 04/17/2015   Memory loss 04/17/2015   Impingement syndrome of shoulder 04/17/2015   Dermatitis, stasis 04/17/2015   Obstructive sleep apnea 05/14/2014   Shortness of breath on exertion 05/06/2014   Mixed hyperlipidemia 02/06/2012   LBP (low back pain) 09/16/2008    Past Surgical History:  Procedure Laterality Date   ABDOMINAL HYSTERECTOMY  1975   ANKLE SURGERY Left approx Jan 2018   APPENDECTOMY  1970   CATARACT EXTRACTION  01/2011   right   COLONOSCOPY WITH PROPOFOL N/A 08/19/2019   Procedure: COLONOSCOPY WITH PROPOFOL;  Surgeon: Jonathon Bellows, MD;  Location: [facility] ENDOSCOPY;  Service: Gastroenterology;  Laterality: N/A;   eye lid surgery  2013   bilateral   FOOT SURGERY     LOWER EXTREMITY ANGIOGRAPHY Left 11/23/2020   Procedure: LOWER EXTREMITY ANGIOGRAPHY;  Surgeon: Katha Cabal, MD;  Location: [facility];  Service: Cardiovascular;  Laterality: Left;   NECK SURGERY     SPINE SURGERY     TUBAL LIGATION     VAGINAL HYSTERECTOMY  1989    Family History  Problem Relation Age of Onset    Aneurysm Mother    Aortic aneurysm Mother    Hypertension Mother    Hyperlipidemia Mother    Heart disease Mother    Obesity Mother    Heart attack Maternal Grandfather    Diabetes Maternal Grandfather     Social History   Tobacco Use   Smoking status: Never   Smokeless tobacco: Never  Substance Use Topics   Alcohol use: No    Alcohol/week: 0.0 standard drinks of alcohol     Current Outpatient Medications:    ACCU-CHEK SOFTCLIX LANCETS lancets, , Disp: , Rfl:    acetaminophen (TYLENOL 8 HOUR) 650 MG CR tablet, Take 1 tablet (650 mg total) by mouth every 8 (eight) hours as needed for pain., Disp: 90 tablet, Rfl: 2   albuterol (VENTOLIN HFA) 108 (90 Base) MCG/ACT inhaler, Inhale 2 puffs into the lungs every 6 (six) hours as needed for wheezing or shortness of breath., Disp: 8 g, Rfl:  2   Alcohol Swabs (B-D SINGLE USE SWABS REGULAR) PADS, , Disp: , Rfl:    amLODipine (NORVASC) 5 MG tablet, Take 1 tablet (5 mg total) by mouth daily., Disp: 90 tablet, Rfl: 1   ASPIRIN LOW DOSE 81 MG EC tablet, TAKE 1 TABLET (81 MG TOTAL) BY MOUTH DAILY., Disp: 30 tablet, Rfl: 0   benzonatate (TESSALON) 100 MG capsule, Take 1-2 capsules (100-200 mg total) by mouth 3 (three) times daily as needed for cough., Disp: 30 capsule, Rfl: 1   Blood Glucose Monitoring Suppl (ACCU-CHEK AVIVA PLUS) w/Device KIT, , Disp: , Rfl:    Budeson-Glycopyrrol-Formoterol (BREZTRI AEROSPHERE) 160-9-4.8 MCG/ACT AERO, Inhale 2 puffs into the lungs in the morning and at bedtime., Disp: 5.9 g, Rfl: 0   buPROPion (WELLBUTRIN XL) 150 MG 24 hr tablet, Take 1 tablet (150 mg total) by mouth daily., Disp: 90 tablet, Rfl: 1   Cholecalciferol (VITAMIN D3) 25 MCG (1000 UT) CAPS, Take 1,000 Units by mouth., Disp: , Rfl:    Cyanocobalamin (VITAMIN B12 PO), Take 1 tablet by mouth daily. , Disp: , Rfl:    dapagliflozin propanediol (FARXIGA) 10 MG TABS tablet, Take 1 tablet by mouth in the morning., Disp: , Rfl:    ezetimibe (ZETIA) 10 MG  tablet, Take 1 tablet (10 mg total) by mouth daily., Disp: 90 tablet, Rfl: 1   furosemide (LASIX) 20 MG tablet, Take 1 tablet by mouth daily., Disp: 90 tablet, Rfl: 0   insulin NPH-regular Human (70-30) 100 UNIT/ML injection, Inject 100 Units into the skin daily with supper., Disp: , Rfl:    irbesartan-hydrochlorothiazide (AVALIDE) 300-12.5 MG tablet, Take 1 tablet by mouth daily., Disp: 90 tablet, Rfl: 1   levothyroxine (SYNTHROID) 25 MCG tablet, Take 1 tablet by mouth daily except, take 1 and 1/2 tablets by mouth on Sunday., Disp: 100 tablet, Rfl: 0   metFORMIN (GLUCOPHAGE) 500 MG tablet, Take 1,000 mg by mouth daily with breakfast., Disp: , Rfl:    metoprolol tartrate (LOPRESSOR) 25 MG tablet, Take 1 tablet by mouth twice daily., Disp: 180 tablet, Rfl: 0   Misc Natural Products (OSTEO BI-FLEX ADV JOINT SHIELD PO), Take by mouth 2 (two) times daily. , Disp: , Rfl:    modafinil (PROVIGIL) 100 MG tablet, Take 1 tablet by mouth daily., Disp: 90 tablet, Rfl: 0   montelukast (SINGULAIR) 10 MG tablet, Take 1 tablet (10 mg total) by mouth at bedtime., Disp: 90 tablet, Rfl: 1   omeprazole (PRILOSEC) 40 MG capsule, Take 1 capsule (40 mg total) by mouth daily., Disp: 90 capsule, Rfl: 1   Potassium 99 MG TABS, Take by mouth daily at 6 (six) AM., Disp: , Rfl:    pregabalin (LYRICA) 300 MG capsule, Take 300 mg by mouth 2 (two) times a day., Disp: , Rfl:    Probiotic Product (PROBIOTIC-10 PO), Take by mouth., Disp: , Rfl:    RELION INSULIN SYRINGE 1ML/31G 31G X 5/16" 1 ML MISC, , Disp: , Rfl:    rosuvastatin (CRESTOR) 40 MG tablet, Take 1 tablet (40 mg total) by mouth daily., Disp: 90 tablet, Rfl: 1   sertraline (ZOLOFT) 100 MG tablet, Take 1.5 tablets (150 mg total) by mouth daily., Disp: 135 tablet, Rfl: 1   traZODone (DESYREL) 100 MG tablet, Take 1 tablet (100 mg total) by mouth at bedtime as needed for sleep., Disp: 90 tablet, Rfl: 1   VASCEPA 1 g capsule, Take 2 capsules (2 g total) by mouth 2 (two) times  daily., Disp: 120 capsule,  Rfl: 5  Allergies  Allergen Reactions   Codeine Other (See Comments)    "TRIPPED OUT"  DIDN'T LIKE THE WAY IT FELT   Atorvastatin     muscle pain   Hydrocodone     itching   Tramadol    Latex Rash   Zolpidem Other (See Comments)    Sleep walk    I personally reviewed active problem list, medication list, allergies, family history, social history, health maintenance with the patient/caregiver today.   ROS  Constitutional: Negative for fever or weight change.  Respiratory: positive  for cough and shortness of breath.   Cardiovascular: Negative for chest pain or palpitations.  Gastrointestinal: Negative for abdominal pain, no bowel changes.  Musculoskeletal:positive  for gait problem but no joint swelling.  Skin: Negative for rash.  Neurological: Negative for dizziness or headache.  No other specific complaints in a complete review of systems (except as listed in HPI above).   Objective  Vitals:   04/04/22 1330  BP: 132/70  Pulse: 72  Resp: 20  Temp: 97.8 F (36.6 C)  TempSrc: Oral  SpO2: 96%  Weight: 246 lb 8 oz (111.8 kg)  Height: $Remove'5\' 2"'mIUjiAg$  (1.575 m)    Body mass index is 45.09 kg/m.  Physical Exam  Constitutional: Patient appears well-developed and well-nourished. Obese  No distress.  HEENT: head atraumatic, normocephalic, pupils equal and reactive to light, neck supple, throat within normal limits Cardiovascular: Normal rate, regular rhythm and normal heart sounds.  No murmur heard. No BLE edema. Pulmonary/Chest: Effort normal and breath sounds normal. No respiratory distress. Abdominal: Soft.  There is no tenderness. Psychiatric: Patient has a normal mood and affect. behavior is normal. Judgment and thought content normal.   Recent Results (from the past 2160 hour(s))  POCT rapid strep A     Status: None   Collection Time: 02/20/22 10:34 AM  Result Value Ref Range   Rapid Strep A Screen Negative Negative     PHQ2/9:     04/04/2022    1:33 PM 02/20/2022   10:15 AM 10/03/2021    1:45 PM 08/30/2021    3:52 PM 05/31/2021    9:50 AM  Depression screen PHQ 2/9  Decreased Interest 2 0 $R'2 3 3  'fc$ Down, Depressed, Hopeless 1 0 $R'2 3 3  'Cy$ PHQ - 2 Score 3 0 $R'4 6 6  'Ar$ Altered sleeping 3 0 0 0 2  Tired, decreased energy 3 0 3 3 0  Change in appetite 2 0 0 0 1  Feeling bad or failure about yourself  2 0 2 1 0  Trouble concentrating 2 0 0 0 0  Moving slowly or fidgety/restless 0 0 0 1 0  Suicidal thoughts 0 0 0 0 0  PHQ-9 Score 15 0 $Re'9 11 9  'JDY$ Difficult doing work/chores Very difficult Not difficult at all  Not difficult at all Somewhat difficult    phq 9 is positive   Fall Risk:    04/04/2022    1:22 PM 02/20/2022   10:15 AM 10/03/2021    1:45 PM 08/30/2021    3:57 PM 05/31/2021    9:49 AM  Fall Risk   Falls in the past year? 0 0 1 0 0  Number falls in past yr:  0 1 0   Injury with Fall?  0 0 0   Risk for fall due to : Impaired balance/gait No Fall Risks Impaired balance/gait;Impaired mobility Impaired balance/gait   Follow up Falls prevention discussed;Education provided;Falls evaluation completed Falls  prevention discussed;Education provided Falls prevention discussed Falls prevention discussed Falls prevention discussed      Functional Status Survey: Is the patient deaf or have difficulty hearing?: No Does the patient have difficulty seeing, even when wearing glasses/contacts?: No Does the patient have difficulty concentrating, remembering, or making decisions?: Yes Does the patient have difficulty walking or climbing stairs?: Yes Does the patient have difficulty dressing or bathing?: No Does the patient have difficulty doing errands alone such as visiting a doctor's office or shopping?: No    Assessment & Plan  1. Dyslipidemia associated with type 2 diabetes mellitus ([name]) *** - VASCEPA 1 g capsule; Take 2 capsules (2 g total) by mouth 2 (two) times daily.  Dispense: 120 capsule; Refill: 5  2. PAD  (peripheral artery disease) ([name]) *** - VASCEPA 1 g capsule; Take 2 capsules (2 g total) by mouth 2 (two) times daily.  Dispense: 120 capsule; Refill: 5  3. Morbid obesity ([name]) ***  4. MDD (major depressive disorder), recurrent episode, moderate ([name]) *** - sertraline (ZOLOFT) 100 MG tablet; Take 1.5 tablets (150 mg total) by mouth daily.  Dispense: 135 tablet; Refill: 1 - modafinil (PROVIGIL) 100 MG tablet; Take 1 tablet (100 mg total) by mouth daily.  Dispense: 90 tablet; Refill: 0  5. Stage 3b chronic kidney disease ([name]) ***  6. Atherosclerosis of aorta ([name]) ***  7. Benign hypertension *** - metoprolol tartrate (LOPRESSOR) 25 MG tablet; Take 1 tablet (25 mg total) by mouth 2 (two) times daily.  Dispense: 180 tablet; Refill: 0  8. Obstructive sleep apnea *** - modafinil (PROVIGIL) 100 MG tablet; Take 1 tablet (100 mg total) by mouth daily.  Dispense: 90 tablet; Refill: 0  9. Insomnia due to other mental disorder *** - traZODone (DESYREL) 100 MG tablet; Take 1 tablet (100 mg total) by mouth at bedtime as needed for sleep.  Dispense: 90 tablet; Refill: 1  10. Spinal stenosis of lumbar region with neurogenic claudication ***  11. Adult hypothyroidism ***  12. Chronic pain of both knees *** - Ambulatory referral to Orthopedic Surgery

## 2022-04-04 ENCOUNTER — Encounter: Payer: Self-pay | Admitting: Family Medicine

## 2022-04-04 ENCOUNTER — Ambulatory Visit (INDEPENDENT_AMBULATORY_CARE_PROVIDER_SITE_OTHER): Payer: No Typology Code available for payment source | Admitting: Family Medicine

## 2022-04-04 DIAGNOSIS — M25561 Pain in right knee: Secondary | ICD-10-CM | POA: Diagnosis not present

## 2022-04-04 DIAGNOSIS — I10 Essential (primary) hypertension: Secondary | ICD-10-CM | POA: Diagnosis not present

## 2022-04-04 DIAGNOSIS — N1832 Chronic kidney disease, stage 3b: Secondary | ICD-10-CM

## 2022-04-04 DIAGNOSIS — G4733 Obstructive sleep apnea (adult) (pediatric): Secondary | ICD-10-CM | POA: Diagnosis not present

## 2022-04-04 DIAGNOSIS — E039 Hypothyroidism, unspecified: Secondary | ICD-10-CM | POA: Diagnosis not present

## 2022-04-04 DIAGNOSIS — I739 Peripheral vascular disease, unspecified: Secondary | ICD-10-CM

## 2022-04-04 DIAGNOSIS — G8929 Other chronic pain: Secondary | ICD-10-CM

## 2022-04-04 DIAGNOSIS — F331 Major depressive disorder, recurrent, moderate: Secondary | ICD-10-CM | POA: Diagnosis not present

## 2022-04-04 DIAGNOSIS — F5105 Insomnia due to other mental disorder: Secondary | ICD-10-CM

## 2022-04-04 DIAGNOSIS — M25562 Pain in left knee: Secondary | ICD-10-CM

## 2022-04-04 DIAGNOSIS — I700 Atherosclerosis of aorta: Secondary | ICD-10-CM

## 2022-04-04 DIAGNOSIS — E1169 Type 2 diabetes mellitus with other specified complication: Secondary | ICD-10-CM

## 2022-04-04 DIAGNOSIS — F99 Mental disorder, not otherwise specified: Secondary | ICD-10-CM

## 2022-04-04 DIAGNOSIS — M48062 Spinal stenosis, lumbar region with neurogenic claudication: Secondary | ICD-10-CM

## 2022-04-04 DIAGNOSIS — E785 Hyperlipidemia, unspecified: Secondary | ICD-10-CM

## 2022-04-04 MED ORDER — MODAFINIL 100 MG PO TABS: 100.0000 mg | ORAL_TABLET | Freq: Every day | ORAL | 0 refills | Status: DC

## 2022-04-04 MED ORDER — TRAZODONE HCL 100 MG PO TABS: 100.0000 mg | ORAL_TABLET | Freq: Every evening | ORAL | 1 refills | Status: DC | PRN

## 2022-04-04 MED ORDER — METOPROLOL TARTRATE 25 MG PO TABS: 25.0000 mg | ORAL_TABLET | Freq: Two times a day (BID) | ORAL | 0 refills | Status: DC

## 2022-04-04 MED ORDER — SERTRALINE HCL 100 MG PO TABS: 150.0000 mg | ORAL_TABLET | Freq: Every day | ORAL | 1 refills | Status: DC

## 2022-04-04 MED ORDER — VASCEPA 1 G PO CAPS: 2.0000 g | ORAL_CAPSULE | Freq: Two times a day (BID) | ORAL | 5 refills | Status: DC

## 2022-04-06 ENCOUNTER — Other Ambulatory Visit: Payer: Self-pay | Admitting: Family Medicine

## 2022-04-06 DIAGNOSIS — E039 Hypothyroidism, unspecified: Secondary | ICD-10-CM

## 2022-04-07 ENCOUNTER — Other Ambulatory Visit: Payer: Self-pay

## 2022-04-07 DIAGNOSIS — E039 Hypothyroidism, unspecified: Secondary | ICD-10-CM

## 2022-04-11 DIAGNOSIS — E1122 Type 2 diabetes mellitus with diabetic chronic kidney disease: Secondary | ICD-10-CM | POA: Diagnosis not present

## 2022-04-11 DIAGNOSIS — I10 Essential (primary) hypertension: Secondary | ICD-10-CM | POA: Diagnosis not present

## 2022-04-11 DIAGNOSIS — D631 Anemia in chronic kidney disease: Secondary | ICD-10-CM | POA: Diagnosis not present

## 2022-04-11 DIAGNOSIS — N1832 Chronic kidney disease, stage 3b: Secondary | ICD-10-CM | POA: Diagnosis not present

## 2022-04-14 ENCOUNTER — Other Ambulatory Visit: Payer: Self-pay

## 2022-04-14 DIAGNOSIS — E1169 Type 2 diabetes mellitus with other specified complication: Secondary | ICD-10-CM

## 2022-04-14 MED ORDER — IRBESARTAN-HYDROCHLOROTHIAZIDE 300-12.5 MG PO TABS
1.0000 | ORAL_TABLET | Freq: Every day | ORAL | 1 refills | Status: DC
Start: 2022-04-14 — End: 2022-08-28

## 2022-04-14 MED ORDER — METFORMIN HCL 500 MG PO TABS: 1000.0000 mg | ORAL_TABLET | Freq: Every day | ORAL | 1 refills | Status: DC

## 2022-04-14 MED ORDER — ROSUVASTATIN CALCIUM 40 MG PO TABS: 40.0000 mg | ORAL_TABLET | Freq: Every day | ORAL | 1 refills | Status: DC

## 2022-04-20 ENCOUNTER — Other Ambulatory Visit: Payer: Self-pay | Admitting: Family Medicine

## 2022-04-20 DIAGNOSIS — K219 Gastro-esophageal reflux disease without esophagitis: Secondary | ICD-10-CM

## 2022-05-20 ENCOUNTER — Other Ambulatory Visit: Payer: Self-pay | Admitting: Family Medicine

## 2022-05-22 ENCOUNTER — Other Ambulatory Visit: Payer: Self-pay

## 2022-05-22 MED ORDER — EZETIMIBE 10 MG PO TABS: 10.0000 mg | ORAL_TABLET | Freq: Every day | ORAL | 3 refills | Status: DC

## 2022-06-01 DIAGNOSIS — E785 Hyperlipidemia, unspecified: Secondary | ICD-10-CM | POA: Diagnosis not present

## 2022-06-01 DIAGNOSIS — I152 Hypertension secondary to endocrine disorders: Secondary | ICD-10-CM | POA: Diagnosis not present

## 2022-06-01 DIAGNOSIS — E1159 Type 2 diabetes mellitus with other circulatory complications: Secondary | ICD-10-CM | POA: Diagnosis not present

## 2022-06-01 DIAGNOSIS — E1169 Type 2 diabetes mellitus with other specified complication: Secondary | ICD-10-CM | POA: Diagnosis not present

## 2022-06-01 DIAGNOSIS — E1142 Type 2 diabetes mellitus with diabetic polyneuropathy: Secondary | ICD-10-CM | POA: Diagnosis not present

## 2022-06-01 DIAGNOSIS — Z79899 Other long term (current) drug therapy: Secondary | ICD-10-CM | POA: Diagnosis not present

## 2022-06-01 DIAGNOSIS — Z794 Long term (current) use of insulin: Secondary | ICD-10-CM | POA: Diagnosis not present

## 2022-06-01 DIAGNOSIS — E039 Hypothyroidism, unspecified: Secondary | ICD-10-CM | POA: Diagnosis not present

## 2022-06-05 DIAGNOSIS — I130 Hypertensive heart and chronic kidney disease with heart failure and stage 1 through stage 4 chronic kidney disease, or unspecified chronic kidney disease: Secondary | ICD-10-CM | POA: Diagnosis not present

## 2022-06-05 DIAGNOSIS — E261 Secondary hyperaldosteronism: Secondary | ICD-10-CM | POA: Diagnosis not present

## 2022-06-05 DIAGNOSIS — J449 Chronic obstructive pulmonary disease, unspecified: Secondary | ICD-10-CM | POA: Diagnosis not present

## 2022-06-05 DIAGNOSIS — I700 Atherosclerosis of aorta: Secondary | ICD-10-CM | POA: Diagnosis not present

## 2022-06-05 DIAGNOSIS — Z008 Encounter for other general examination: Secondary | ICD-10-CM | POA: Diagnosis not present

## 2022-06-05 DIAGNOSIS — E785 Hyperlipidemia, unspecified: Secondary | ICD-10-CM | POA: Diagnosis not present

## 2022-06-05 DIAGNOSIS — R2681 Unsteadiness on feet: Secondary | ICD-10-CM | POA: Diagnosis not present

## 2022-06-05 DIAGNOSIS — K219 Gastro-esophageal reflux disease without esophagitis: Secondary | ICD-10-CM | POA: Diagnosis not present

## 2022-06-05 DIAGNOSIS — N184 Chronic kidney disease, stage 4 (severe): Secondary | ICD-10-CM | POA: Diagnosis not present

## 2022-06-05 DIAGNOSIS — E1122 Type 2 diabetes mellitus with diabetic chronic kidney disease: Secondary | ICD-10-CM | POA: Diagnosis not present

## 2022-06-05 DIAGNOSIS — Z794 Long term (current) use of insulin: Secondary | ICD-10-CM | POA: Diagnosis not present

## 2022-06-05 DIAGNOSIS — G4700 Insomnia, unspecified: Secondary | ICD-10-CM | POA: Diagnosis not present

## 2022-06-05 DIAGNOSIS — Z6841 Body Mass Index (BMI) 40.0 and over, adult: Secondary | ICD-10-CM | POA: Diagnosis not present

## 2022-06-05 DIAGNOSIS — E1169 Type 2 diabetes mellitus with other specified complication: Secondary | ICD-10-CM | POA: Diagnosis not present

## 2022-06-05 DIAGNOSIS — I509 Heart failure, unspecified: Secondary | ICD-10-CM | POA: Diagnosis not present

## 2022-06-05 DIAGNOSIS — Z89422 Acquired absence of other left toe(s): Secondary | ICD-10-CM | POA: Diagnosis not present

## 2022-06-05 DIAGNOSIS — E039 Hypothyroidism, unspecified: Secondary | ICD-10-CM | POA: Diagnosis not present

## 2022-06-05 DIAGNOSIS — E1142 Type 2 diabetes mellitus with diabetic polyneuropathy: Secondary | ICD-10-CM | POA: Diagnosis not present

## 2022-06-06 ENCOUNTER — Ambulatory Visit (INDEPENDENT_AMBULATORY_CARE_PROVIDER_SITE_OTHER): Payer: No Typology Code available for payment source | Admitting: Podiatry

## 2022-06-06 DIAGNOSIS — R52 Pain, unspecified: Secondary | ICD-10-CM

## 2022-06-06 DIAGNOSIS — L97522 Non-pressure chronic ulcer of other part of left foot with fat layer exposed: Secondary | ICD-10-CM

## 2022-06-06 DIAGNOSIS — Z794 Long term (current) use of insulin: Secondary | ICD-10-CM | POA: Diagnosis not present

## 2022-06-06 DIAGNOSIS — E0843 Diabetes mellitus due to underlying condition with diabetic autonomic (poly)neuropathy: Secondary | ICD-10-CM | POA: Diagnosis not present

## 2022-06-06 DIAGNOSIS — E1142 Type 2 diabetes mellitus with diabetic polyneuropathy: Secondary | ICD-10-CM | POA: Diagnosis not present

## 2022-06-06 LAB — LIPID PANEL
Cholesterol: 141 (ref 0–200)
HDL: 39 (ref 35–70)
LDL Cholesterol: 43
Triglycerides: 295 — AB (ref 40–160)

## 2022-06-06 LAB — COMPREHENSIVE METABOLIC PANEL
Albumin: 4.4 (ref 3.5–5.0)
Calcium: 9.9 (ref 8.7–10.7)

## 2022-06-06 LAB — TSH: TSH: 2.57 (ref <=5.90)

## 2022-06-06 LAB — PROTEIN / CREATININE RATIO, URINE
Albumin, U: 8
Creatinine, Urine: 16.8

## 2022-06-06 LAB — HEPATIC FUNCTION PANEL
ALT: 13 U/L (ref 7–35)
AST: 17 (ref 13–35)
Alkaline Phosphatase: 59 (ref 25–125)
Bilirubin, Total: 0.4

## 2022-06-06 LAB — HEMOGLOBIN A1C: Hemoglobin A1C: 7.5

## 2022-06-06 LAB — VITAMIN B12: Vitamin B-12: >1.5

## 2022-06-06 LAB — MICROALBUMIN / CREATININE URINE RATIO: Microalb Creat Ratio: 47.6

## 2022-06-06 NOTE — Progress Notes (Signed)
Chief Complaint  Patient presents with   left foot bleeding    Patient states that she called for appointment because she had some bleeding in the bottom of left foot great toe and wants the doctor to take a look at it.    Subjective:  71 y.o. female with PMHx of diabetes mellitus presenting today for evaluation of bleeding to the left great toe.  Patient states that she noticed bleeding to the left great toe over the past few days.  She would like to have it evaluated.  She denies a history of injury.  She is diabetic with previous amputation of the left fifth digit.   Past Medical History:  Diagnosis Date   Anemia    Arthritis    Asthma    B12 deficiency    Back pain    Carpal tunnel syndrome    Chronic kidney disease    Constipation    Depression    Depressive disorder    Diabetes mellitus    Dyspnea    Fluid retention    Foot pain    GERD (gastroesophageal reflux disease)    Headache    History of hiatal hernia    Hyperlipidemia    Hypertension    IBS (irritable bowel syndrome)    Insomnia    Joint pain    Lumbago    Memory loss    Obesity    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)    Pneumonia    Rhinitis, allergic    Rosacea    Sleep apnea    SOB (shortness of breath)    Thyroid disease    Unspecified hearing loss    Unspecified hereditary and idiopathic peripheral neuropathy    Unspecified sleep apnea    Vitamin D deficiency     Past Surgical History:  Procedure Laterality Date   ABDOMINAL HYSTERECTOMY  1975   ANKLE SURGERY Left approx Jan 2018   APPENDECTOMY  1970   CATARACT EXTRACTION  01/2011   right   COLONOSCOPY WITH PROPOFOL N/A 08/19/2019   Procedure: COLONOSCOPY WITH PROPOFOL;  Surgeon: Jonathon Bellows, MD;  Location: [facility] ENDOSCOPY;  Service: Gastroenterology;  Laterality: N/A;   eye lid surgery  2013   bilateral   FOOT SURGERY     LOWER EXTREMITY ANGIOGRAPHY Left 11/23/2020   Procedure: LOWER EXTREMITY ANGIOGRAPHY;  Surgeon: Katha Cabal, MD;   Location: [facility];  Service: Cardiovascular;  Laterality: Left;   NECK SURGERY     SPINE SURGERY     TUBAL LIGATION     VAGINAL HYSTERECTOMY  1989    Allergies  Allergen Reactions   Codeine Other (See Comments)    "TRIPPED OUT"  DIDN'T LIKE THE WAY IT FELT   Atorvastatin     muscle pain   Hydrocodone     itching   Tramadol    Latex Rash   Zolpidem Other (See Comments)    Sleep walk      Objective/Physical Exam General: The patient is alert and oriented x3 in no acute distress.  Dermatology:  Wound #1 noted to the 0.8 x 0.4 x 0.2 cm (LxWxD).   To the noted ulceration(s), there is no eschar. There is a moderate amount of slough, fibrin, and necrotic tissue noted. Granulation tissue and wound base is red. There is a minimal amount of serosanguineous drainage noted. There is no exposed bone muscle-tendon ligament or joint. There is no malodor. Periwound integrity is intact. Skin is warm, dry and supple bilateral  lower extremities.  Vascular: Palpable pedal pulses bilaterally. No edema or erythema noted. Capillary refill within normal limits.  Neurological: Epicritic and protective threshold diminished bilaterally.   Musculoskeletal Exam: Prior amputation left fifth digit  Assessment: 1.  Ulcer left great toe secondary to diabetes mellitus 2. diabetes mellitus w/ peripheral neuropathy   Plan of Care:  1. Patient was evaluated. 2. medically necessary excisional debridement including subcutaneous tissue was performed using a tissue nipper and a chisel blade. Excisional debridement of all the necrotic nonviable tissue down to healthy bleeding viable tissue was performed with post-debridement measurements same as pre-. 3. the wound was cleansed and dry sterile dressing applied. 4.  Prescription for gentamicin cream.  Apply daily with a light dressing  5.  Offloading felt dancers pads were applied to the insoles of the patient's diabetic shoes to offload pressure  from the great toe joint  6.  Patient is to return to clinic in 3 weeks   Edrick Kins, DPM [facility]  Dr. Edrick Kins, DPM    [address], [place] [address]                Office [phone]  Fax [phone]

## 2022-06-09 ENCOUNTER — Ambulatory Visit (INDEPENDENT_AMBULATORY_CARE_PROVIDER_SITE_OTHER): Payer: No Typology Code available for payment source | Admitting: Podiatry

## 2022-06-09 DIAGNOSIS — E0843 Diabetes mellitus due to underlying condition with diabetic autonomic (poly)neuropathy: Secondary | ICD-10-CM

## 2022-06-09 DIAGNOSIS — M2041 Other hammer toe(s) (acquired), right foot: Secondary | ICD-10-CM

## 2022-06-09 LAB — HM DIABETES FOOT EXAM

## 2022-06-09 NOTE — Progress Notes (Signed)
Patient presents to the office today for diabetic shoe and insole measuring.  Patient was measured with brannock device to determine size and width for 1 pair of extra depth shoes and foam casted for 3 pair of insoles.   ABN signed.   Documentation of medical necessity will be sent to patient's treating diabetic doctor to verify and sign.   Patient's diabetic provider: Dr. Daylene Katayama  Shoes and insoles will be ordered at that time and patient will be notified for an appointment for fitting when they arrive.   Brannock measurement: 9 wide  Patient shoe selection-   1st   Shoe choice:   [id number]  2nd  Shoe choice:     Shoe size ordered: 9 wide

## 2022-06-19 ENCOUNTER — Other Ambulatory Visit: Payer: Self-pay | Admitting: Family Medicine

## 2022-06-19 DIAGNOSIS — J069 Acute upper respiratory infection, unspecified: Secondary | ICD-10-CM

## 2022-06-19 DIAGNOSIS — E039 Hypothyroidism, unspecified: Secondary | ICD-10-CM

## 2022-06-19 DIAGNOSIS — N1832 Chronic kidney disease, stage 3b: Secondary | ICD-10-CM

## 2022-06-20 DIAGNOSIS — I152 Hypertension secondary to endocrine disorders: Secondary | ICD-10-CM | POA: Diagnosis not present

## 2022-06-20 DIAGNOSIS — E1159 Type 2 diabetes mellitus with other circulatory complications: Secondary | ICD-10-CM | POA: Diagnosis not present

## 2022-06-20 LAB — BASIC METABOLIC PANEL
BUN: 52 — AB (ref 4–21)
CO2: 29 — AB (ref 13–22)
Chloride: 105 (ref 99–108)
Creatinine: 1.9 — AB (ref <=1.1)
Glucose: 140
Potassium: 4.7 mEq/L (ref 3.5–5.1)
Sodium: 141 (ref 137–147)

## 2022-06-20 LAB — COMPREHENSIVE METABOLIC PANEL: eGFR: 28

## 2022-06-27 ENCOUNTER — Ambulatory Visit: Payer: No Typology Code available for payment source | Admitting: Podiatry

## 2022-06-30 ENCOUNTER — Encounter: Payer: Self-pay | Admitting: Internal Medicine

## 2022-06-30 ENCOUNTER — Ambulatory Visit (INDEPENDENT_AMBULATORY_CARE_PROVIDER_SITE_OTHER): Payer: No Typology Code available for payment source | Admitting: Internal Medicine

## 2022-06-30 VITALS — BP 136/82 | HR 93 | Temp 97.9°F | Resp 16 | Ht 62.0 in | Wt 238.9 lb

## 2022-06-30 DIAGNOSIS — Z23 Encounter for immunization: Secondary | ICD-10-CM | POA: Diagnosis not present

## 2022-06-30 DIAGNOSIS — T148XXA Other injury of unspecified body region, initial encounter: Secondary | ICD-10-CM | POA: Diagnosis not present

## 2022-06-30 DIAGNOSIS — L03116 Cellulitis of left lower limb: Secondary | ICD-10-CM

## 2022-06-30 MED ORDER — DOXYCYCLINE HYCLATE 100 MG PO TABS: 100.0000 mg | ORAL_TABLET | Freq: Two times a day (BID) | ORAL | 0 refills | Status: AC

## 2022-06-30 NOTE — Patient Instructions (Signed)
It was great seeing you today!  Plan discussed at today's visit: -Doxycycline 100 mg twice a day for 10 days -Keep wound covered and use topical antibiotic ointment  -If you have pain, increased swelling or redness please let me know -Wound culture sent as well  Follow up in: as needed  Take care and let us know if you have any questions or concerns prior to your next visit.  Dr. Rosana Berger

## 2022-06-30 NOTE — Progress Notes (Signed)
Acute Office Visit  Subjective:     Patient ID: Summer [name], female    DOB: Jul 24, 1951, 71 y.o.   MRN: [id number]  Chief Complaint  Patient presents with   Abrasion    Left side of foot, had fall and thinks rubbed raw trying to get up from carpet    HPI Patient is in today for wound on left foot.  Patient states she got out of bed to go to the bathroom in the middle the night, was trying to get back in the bed but was unable to get her footing and was scraping her foot across the side of the bed which caused a large abrasion on the left lateral side of her foot.  She does have a history of diabetes and history of left fifth digit amputation secondary to infection.  This incident occurred about 5 days ago.  She has mild pain but does endorse neuropathy.  She states she has noticed the area of redness is slowly spreading up the lateral side of her foot.  She denies swelling, drainage from the wound or fevers.  Her husband is cleaning it with soap and water, applying a topical antibiotics and keeping it covered for her.  She states over time it does appear to be gradually worsening.   Review of Systems  Constitutional:  Negative for chills and fever.  Skin:  Positive for rash.        Objective:    BP 136/82   Pulse 93   Temp 97.9 F (36.6 C)   Resp 16   Ht '5\' 2"'$  (1.575 m)   Wt 238 lb 14.4 oz (108.4 kg)   SpO2 96%   BMI 43.70 kg/m  BP Readings from Last 3 Encounters:  06/30/22 136/82  04/04/22 132/70  02/24/22 (!) 100/58   Wt Readings from Last 3 Encounters:  06/30/22 238 lb 14.4 oz (108.4 kg)  04/04/22 246 lb 8 oz (111.8 kg)  02/24/22 242 lb (109.8 kg)      Physical Exam Constitutional:      Appearance: Normal appearance.  HENT:     Head: Normocephalic and atraumatic.  Eyes:     Conjunctiva/sclera: Conjunctivae normal.  Cardiovascular:     Rate and Rhythm: Normal rate and regular rhythm.  Pulmonary:     Effort: Pulmonary effort is normal.     Breath sounds:  Normal breath sounds.  Musculoskeletal:        General: Tenderness and signs of injury present.     Right lower leg: No edema.     Left lower leg: No edema.     Comments: Quarter sized abrasion on the lateral aspect of left foot surrounded by erythema, no swelling, fluctuance, drainage from the wound.   Skin:    General: Skin is warm and dry.     Findings: Rash present.  Neurological:     General: No focal deficit present.     Mental Status: She is alert. Mental status is at baseline.  Psychiatric:        Mood and Affect: Mood normal.        Behavior: Behavior normal.     No results found for any visits on 06/30/22.      Assessment & Plan:   1. Abrasion/Cellulitis of left lower extremity: Wound culture taken, due to patient's history with wounds and amputations, she will be empirically started on Doxycycline 100 mg BID x 10 days or until culture comes back. Wound was cleaned and  wrapped. She has a follow up here scheduled next week and has a podiatrist with whom she follows with regularly.   - doxycycline (VIBRA-TABS) 100 MG tablet; Take 1 tablet (100 mg total) by mouth 2 (two) times daily for 10 days.  Dispense: 20 tablet; Refill: 0 - Wound culture  2. Need for influenza vaccination: Flu vaccine administered today.   - Flu Vaccine QUAD High Dose(Fluad)  Return for already scheduled .  Teodora Medici, DO

## 2022-07-03 LAB — WOUND CULTURE
MICRO NUMBER:: 14083363
SPECIMEN QUALITY:: ADEQUATE

## 2022-07-03 NOTE — Progress Notes (Addendum)
Name: Summer [name]   MRN: [id number]    DOB: 30-Jun-1951   Date:07/04/2022       Progress Note  Subjective  Chief Complaint  Follow Up  HPI  Hypothyroidism: she states hair loss is stable, no dysphagia, no change in bowel movements. On levothyroxine 25 mg daily and one and two on Sundays , last level was at goal - 2.57 . Continue current dose   COPD: from second hand smoking, last visit with Dr. Mortimer Fries was 10/2021 and gave him Brezti. . She has SOB with activity that has been chronic She tried inhalers but it did not help with symptoms and she stopped using it . Advised to try it again   Morbid Obesity: BMI is dropping, weight is 238 lbs down 8 lbs since July 2023. She is eating healthier at home.Avoiding sweets    MDD: she is currently taking Zoloft 150 mg and Wellbutrin, she took Abilify in the past but caused tardive dyskinesia. Phq 9 is negative, she is feeling well   OA knee: seen by Ortho in the past and pain is getting progressively worse, affecting her gait, she has tried injections without help. She is not a candidate for surgery due to her weight   Right lumbar radiculitis: . She is already taking gabapentin for neuropathy, She has a history of DDD lumbar spine, seen by Dr. Lacinda Axon in the past and had procedures done. She also saw Dr. Garen Grams but no recent procedures. No radiculitis at this time, pain is no longer daily , usually happens in am's , after sleep    Atherosclerosis of aorta  found on CT abdomen, she also had peripheral vascular craterization 04/25 and she has infrarenal obstruction. She is on Crestor 20 mg , Vascepa and Zetia . She cannot afford Repatha . She had intervention done back in early 22 with angiogram and claudication improved. Unchanged    Diabetes type II insulin requiring  : she is under the care of Endocrinologist, Dr. Honor Junes ,lats A1C was 7.16 May 2022  She has dyslipidemia, obesity, CKI, and HTN associated with DM. She is now on Farxiga from assistance  program , NPH insulin, and Metformin. She has charcot's foot and history of amputation 5th toe left due to osteomyelitis . She sees podiatrist   CKI:  GFR low at 28 and urine micro is up  She is on ARB and Farxiga , bp is controlled, seeing nephrologist - Dr. Holley Raring. She has good urine output and no pruritis   Dyslipidemia: reviewed last labs, LDL was 43 done in Sept 2023   Heart failure with preserved ejection fracture : EF 65 % but has mild increase in left ventricular wall thickness   OSA: she continues to wake up feeling tired, reviewed drug database and not able to fill Modafinil, likely due to cost. We will send to [facility] so she can use goodrx and pay cash for it, she is still not wearing CPAP machine, unable to tolerate it She is on 100 mg of Trazodone at night . Explained the risk of respiratory suppression without wearing CPAP machine   HTN: bp at home is within normal limits, she denies chest pain or palpitation, she has SOB with activity but stable. She saw Dr. Saunders Revel for SOB, likely multifactorial Unchanged   Cellulitis left foot: seen by Dr. Rosana Berger, taking doxycycline and is doing well, redness resolved    Patient Active Problem List   Diagnosis Date Noted   Chronic obstructive pulmonary disease ([place])  01/28/2021   Atherosclerosis of native arteries of the extremities with ulceration ([place]) 12/03/2020   Diabetes mellitus ([place]) 05/24/2020   B12 deficiency 05/24/2020   Vitamin D deficiency 05/24/2020   Metatarsalgia of left foot 01/15/2020   Major depressive disorder, recurrent episode, moderate ([place]) 04/04/2019   Left arm weakness 04/03/2019   (HFpEF) heart failure with preserved ejection fraction ([place]) 08/31/2017   Moderate persistent asthma 08/31/2017   Hyperlipidemia associated with type 2 diabetes mellitus ([place]) 06/14/2017   Dyslipidemia associated with type 2 diabetes mellitus ([place]) 08/18/2016   Charcot foot due to diabetes mellitus ([place]) 02/22/2016   Acquired abduction  deformity of foot 07/12/2015   Osteoarthritis of subtalar joint 07/12/2015   Poorly controlled type 2 diabetes mellitus with neuropathy ([place]) 07/12/2015   Arthritis of foot, degenerative 07/12/2015   Carpal tunnel syndrome 04/17/2015   Chronic constipation 04/17/2015   Insomnia, persistent 04/17/2015   Stage 3 chronic kidney disease ([place]) 04/17/2015   Decreased exercise tolerance 04/17/2015   Diabetes mellitus with polyneuropathy ([place]) 04/17/2015   Gastro-esophageal reflux disease without esophagitis 04/17/2015   Bursitis, trochanteric 04/17/2015   Cephalalgia 04/17/2015   Hypertension associated with diabetes ([place]) 04/17/2015   Adult hypothyroidism 04/17/2015   Hearing loss 04/17/2015   Chronic recurrent major depressive disorder ([place]) 04/17/2015   Neurogenic claudication 04/17/2015   Class 3 severe obesity with serious comorbidity and body mass index (BMI) of 45.0 to 49.9 in adult ([place]) 04/17/2015   Hypo-ovarianism 04/17/2015   Perennial allergic rhinitis with seasonal variation 04/17/2015   Acne erythematosa 04/17/2015   Dyskinesia, tardive 04/17/2015   Memory loss 04/17/2015   Impingement syndrome of shoulder 04/17/2015   Dermatitis, stasis 04/17/2015   Obstructive sleep apnea 05/14/2014   Shortness of breath on exertion 05/06/2014   Mixed hyperlipidemia 02/06/2012   LBP (low back pain) 09/16/2008    Past Surgical History:  Procedure Laterality Date   ABDOMINAL HYSTERECTOMY  1975   ANKLE SURGERY Left approx Jan 2018   APPENDECTOMY  1970   CATARACT EXTRACTION  01/2011   right   COLONOSCOPY WITH PROPOFOL N/A 08/19/2019   Procedure: COLONOSCOPY WITH PROPOFOL;  Surgeon: Jonathon Bellows, MD;  Location: [facility] ENDOSCOPY;  Service: Gastroenterology;  Laterality: N/A;   eye lid surgery  2013   bilateral   FOOT SURGERY     LOWER EXTREMITY ANGIOGRAPHY Left 11/23/2020   Procedure: LOWER EXTREMITY ANGIOGRAPHY;  Surgeon: Katha Cabal, MD;  Location: [facility];  Service:  Cardiovascular;  Laterality: Left;   NECK SURGERY     SPINE SURGERY     TUBAL LIGATION     VAGINAL HYSTERECTOMY  1989    Family History  Problem Relation Age of Onset   Aneurysm Mother    Aortic aneurysm Mother    Hypertension Mother    Hyperlipidemia Mother    Heart disease Mother    Obesity Mother    Heart attack Maternal Grandfather    Diabetes Maternal Grandfather     Social History   Tobacco Use   Smoking status: Never   Smokeless tobacco: Never  Substance Use Topics   Alcohol use: No    Alcohol/week: 0.0 standard drinks of alcohol     Current Outpatient Medications:    ACCU-CHEK SOFTCLIX LANCETS lancets, , Disp: , Rfl:    acetaminophen (TYLENOL 8 HOUR) 650 MG CR tablet, Take 1 tablet (650 mg total) by mouth every 8 (eight) hours as needed for pain., Disp: 90 tablet, Rfl: 2   albuterol (VENTOLIN HFA)  108 (90 Base) MCG/ACT inhaler, Inhale 2 puffs into the lungs every 6 (six) hours as needed for wheezing or shortness of breath., Disp: 8 g, Rfl: 2   Alcohol Swabs (B-D SINGLE USE SWABS REGULAR) PADS, , Disp: , Rfl:    amLODipine (NORVASC) 5 MG tablet, Take 1 tablet by mouth daily., Disp: 90 tablet, Rfl: 0   ASPIRIN LOW DOSE 81 MG EC tablet, TAKE 1 TABLET (81 MG TOTAL) BY MOUTH DAILY., Disp: 30 tablet, Rfl: 0   benzonatate (TESSALON) 100 MG capsule, Take 1-2 capsules (100-200 mg total) by mouth 3 (three) times daily as needed for cough., Disp: 30 capsule, Rfl: 1   Blood Glucose Monitoring Suppl (ACCU-CHEK AVIVA PLUS) w/Device KIT, , Disp: , Rfl:    Budeson-Glycopyrrol-Formoterol (BREZTRI AEROSPHERE) 160-9-4.8 MCG/ACT AERO, Inhale 2 puffs into the lungs in the morning and at bedtime., Disp: 5.9 g, Rfl: 0   buPROPion (WELLBUTRIN XL) 150 MG 24 hr tablet, Take 1 tablet (150 mg total) by mouth daily., Disp: 90 tablet, Rfl: 1   Cholecalciferol (VITAMIN D3) 25 MCG (1000 UT) CAPS, Take 1,000 Units by mouth., Disp: , Rfl:    Continuous Blood Gluc Sensor (FREESTYLE LIBRE 2 SENSOR)  MISC, FOR GLUCOSE MONITORING, Disp: , Rfl:    Cyanocobalamin (VITAMIN B12 PO), Take 1 tablet by mouth daily. , Disp: , Rfl:    dapagliflozin propanediol (FARXIGA) 10 MG TABS tablet, Take 1 tablet by mouth in the morning., Disp: , Rfl:    doxycycline (VIBRA-TABS) 100 MG tablet, Take 1 tablet (100 mg total) by mouth 2 (two) times daily for 10 days., Disp: 20 tablet, Rfl: 0   ezetimibe (ZETIA) 10 MG tablet, Take 1 tablet (10 mg total) by mouth daily., Disp: 90 tablet, Rfl: 3   furosemide (LASIX) 20 MG tablet, Take 1 tablet by mouth daily., Disp: 90 tablet, Rfl: 0   insulin NPH-regular Human (70-30) 100 UNIT/ML injection, Inject 100 Units into the skin daily with supper., Disp: , Rfl:    irbesartan-hydrochlorothiazide (AVALIDE) 300-12.5 MG tablet, Take 1 tablet by mouth daily., Disp: 90 tablet, Rfl: 1   levothyroxine (SYNTHROID) 25 MCG tablet, Take 1 tablet by mouth daily except, take 1 and 1/2 tablets by mouth on Sunday., Disp: 100 tablet, Rfl: 0   metFORMIN (GLUCOPHAGE) 500 MG tablet, Take 2 tablets (1,000 mg total) by mouth daily with breakfast., Disp: 90 tablet, Rfl: 1   metoprolol tartrate (LOPRESSOR) 25 MG tablet, Take 1 tablet (25 mg total) by mouth 2 (two) times daily., Disp: 180 tablet, Rfl: 0   Misc Natural Products (OSTEO BI-FLEX ADV JOINT SHIELD PO), Take by mouth 2 (two) times daily. , Disp: , Rfl:    modafinil (PROVIGIL) 100 MG tablet, Take 1 tablet (100 mg total) by mouth daily., Disp: 90 tablet, Rfl: 0   montelukast (SINGULAIR) 10 MG tablet, Take 1 tablet by mouth at bedtime., Disp: 90 tablet, Rfl: 0   omeprazole (PRILOSEC) 40 MG capsule, Take 1 capsule by mouth daily., Disp: 90 capsule, Rfl: 0   Potassium 99 MG TABS, Take by mouth daily at 6 (six) AM., Disp: , Rfl:    pregabalin (LYRICA) 300 MG capsule, Take 300 mg by mouth 2 (two) times a day., Disp: , Rfl:    Probiotic Product (PROBIOTIC-10 PO), Take by mouth., Disp: , Rfl:    RELION INSULIN SYRINGE 1ML/31G 31G X 5/16" 1 ML MISC, ,  Disp: , Rfl:    rosuvastatin (CRESTOR) 40 MG tablet, Take 1 tablet (40 mg total) by  mouth daily., Disp: 90 tablet, Rfl: 1   sertraline (ZOLOFT) 100 MG tablet, Take 1.5 tablets (150 mg total) by mouth daily., Disp: 135 tablet, Rfl: 1   traZODone (DESYREL) 100 MG tablet, Take 1 tablet (100 mg total) by mouth at bedtime as needed for sleep., Disp: 90 tablet, Rfl: 1   VASCEPA 1 g capsule, Take 2 capsules (2 g total) by mouth 2 (two) times daily., Disp: 120 capsule, Rfl: 5  Allergies  Allergen Reactions   Codeine Other (See Comments)    "TRIPPED OUT"  DIDN'T LIKE THE WAY IT FELT   Atorvastatin     muscle pain   Hydrocodone     itching   Tramadol    Latex Rash   Zolpidem Other (See Comments)    Sleep walk    I personally reviewed active problem list, medication list, allergies, family history, social history, health maintenance with the patient/caregiver today.   ROS  Constitutional: Negative for fever, positive for  weight change.  Respiratory: Negative for cough and shortness of breath.   Cardiovascular: Negative for chest pain or palpitations.  Gastrointestinal: Negative for abdominal pain, no bowel changes.  Musculoskeletal: positive  for gait problem no  joint swelling.  Skin: Negative for rash.  Neurological: Negative for dizziness or headache.  No other specific complaints in a complete review of systems (except as listed in HPI above).   Objective  Vitals:   07/04/22 1421  BP: 132/82  Pulse: 75  Resp: 18  Temp: 97.8 F (36.6 C)  TempSrc: Oral  SpO2: 98%  Weight: 238 lb 4.8 oz (108.1 kg)  Height: $Remove'5\' 2"'qKciONS$  (1.575 m)    Body mass index is 43.59 kg/m.  Physical Exam  Constitutional: Patient appears well-developed and well-nourished. Obese  No distress.  HEENT: head atraumatic, normocephalic, pupils equal and reactive to light, neck supple Cardiovascular: Normal rate, regular rhythm and normal heart sounds.  No murmur heard. No BLE edema. Pulmonary/Chest: Effort  normal and breath sounds normal. No respiratory distress. Abdominal: Soft.  There is no tenderness. Psychiatric: Patient has a normal mood and affect. behavior is normal. Judgment and thought content normal.   Recent Results (from the past 2160 hour(s))  Microalbumin / creatinine urine ratio     Status: None   Collection Time: 06/06/22 12:00 AM  Result Value Ref Range   Microalb Creat Ratio 47.6   Protein / creatinine ratio, urine     Status: None   Collection Time: 06/06/22 12:00 AM  Result Value Ref Range   Creatinine, Urine 16.8    Albumin, U 8   Comprehensive metabolic panel     Status: None   Collection Time: 06/06/22 12:00 AM  Result Value Ref Range   Calcium 9.9 8.7 - 10.7   Albumin 4.4 3.5 - 5.0  Lipid panel     Status: Abnormal   Collection Time: 06/06/22 12:00 AM  Result Value Ref Range   Triglycerides 295 (A) 40 - 160   Cholesterol 141 0 - 200   HDL 39 35 - 70   LDL Cholesterol 43   Hepatic function panel     Status: None   Collection Time: 06/06/22 12:00 AM  Result Value Ref Range   Alkaline Phosphatase 59 25 - 125   ALT 13 7 - 35 U/L   AST 17 13 - 35   Bilirubin, Total 0.4   Vitamin B12     Status: None   Collection Time: 06/06/22 12:00 AM  Result Value Ref  Range   Vitamin B-12 >1.500   Hemoglobin A1c     Status: None   Collection Time: 06/06/22 12:00 AM  Result Value Ref Range   Hemoglobin A1C 7.5   TSH     Status: None   Collection Time: 06/06/22 12:00 AM  Result Value Ref Range   TSH 2.57 0.41 - 5.90  HM DIABETES FOOT EXAM     Status: None   Collection Time: 06/09/22 12:00 AM  Result Value Ref Range   HM Diabetic Foot Exam Podiatrist   Basic metabolic panel     Status: Abnormal   Collection Time: 06/20/22 12:00 AM  Result Value Ref Range   Glucose 140    BUN 52 (A) 4 - 21   CO2 29 (A) 13 - 22   Creatinine 1.9 (A) 0.5 - 1.1   Potassium 4.7 3.5 - 5.1 mEq/L   Sodium 141 137 - 147   Chloride 105 99 - 108  Comprehensive metabolic panel     Status:  None   Collection Time: 06/20/22 12:00 AM  Result Value Ref Range   eGFR 28   Wound culture     Status: Abnormal   Collection Time: 06/30/22  2:08 PM   Specimen: Wound  Result Value Ref Range   MICRO NUMBER: [id number]    SPECIMEN QUALITY: Adequate    SOURCE: FOOT, LEFT    STATUS: FINAL    GRAM STAIN:      No white blood cells seen No epithelial cells seen No organisms seen   ISOLATE 1: Staphylococcus aureus (A)     Comment: Heavy growth of Staphylococcus aureus Negative for inducible clindamycin resistance.      Susceptibility   Staphylococcus aureus - AEROBIC CULT, GRAM STAIN POSITIVE 1    VANCOMYCIN <=0.5 Sensitive     CIPROFLOXACIN <=0.5 Sensitive     CLINDAMYCIN <=0.25 Sensitive     LEVOFLOXACIN <=0.12 Sensitive     ERYTHROMYCIN >=8 Resistant     GENTAMICIN <=0.5 Sensitive     OXACILLIN* <=0.25 Sensitive      * Oxacillin susceptible staphylococci are susceptible to other penicillinase-stable penicillins (e.g., methicillin, nafcillin), beta- lactam/beta-lactamase inhibitor combinations, and cephems with staphylococcal indications, including cefazolin.     TETRACYCLINE <=1 Sensitive     TRIMETH/SULFA* <=10 Sensitive      * Oxacillin susceptible staphylococci are susceptible to other penicillinase-stable penicillins (e.g., methicillin, nafcillin), beta- lactam/beta-lactamase inhibitor combinations, and cephems with staphylococcal indications, including cefazolin. Legend: S = Susceptible  I = Intermediate R = Resistant  NS = Not susceptible * = Not tested  NR = Not reported **NN = See antimicrobic comments     PHQ2/9:    07/04/2022    2:24 PM 06/30/2022    1:13 PM 04/04/2022    1:33 PM 02/20/2022   10:15 AM 10/03/2021    1:45 PM  Depression screen PHQ 2/9  Decreased Interest 0 0 2 0 2  Down, Depressed, Hopeless 0 0 1 0 2  PHQ - 2 Score 0 0 3 0 4  Altered sleeping 0 0 3 0 0  Tired, decreased energy 0 0 3 0 3  Change in appetite 0 0 2 0 0  Feeling bad or  failure about yourself  0 0 2 0 2  Trouble concentrating 0 0 2 0 0  Moving slowly or fidgety/restless 0 0 0 0 0  Suicidal thoughts 0 0 0 0 0  PHQ-9 Score 0 0 15 0 9  Difficult doing work/chores  Not difficult at all Very difficult Not difficult at all     phq 9 is negative   Fall Risk:    07/04/2022    2:24 PM 06/30/2022    1:11 PM 04/04/2022    1:22 PM 02/20/2022   10:15 AM 10/03/2021    1:45 PM  Fall Risk   Falls in the past year? 1 1 0 0 1  Number falls in past yr: 1 1  0 1  Injury with Fall? 1 1  0 0  Risk for fall due to : Impaired balance/gait;Impaired mobility;History of fall(s) Impaired balance/gait;Impaired mobility Impaired balance/gait No Fall Risks Impaired balance/gait;Impaired mobility  Follow up Falls prevention discussed;Education provided;Falls evaluation completed  Falls prevention discussed;Education provided;Falls evaluation completed Falls prevention discussed;Education provided Falls prevention discussed      Functional Status Survey: Is the patient deaf or have difficulty hearing?: No Does the patient have difficulty seeing, even when wearing glasses/contacts?: No Does the patient have difficulty concentrating, remembering, or making decisions?: No Does the patient have difficulty walking or climbing stairs?: Yes Does the patient have difficulty dressing or bathing?: Yes Does the patient have difficulty doing errands alone such as visiting a doctor's office or shopping?: Yes    Assessment & Plan  1. Dyslipidemia associated with type 2 diabetes mellitus ([place])  Continue regular visits with Dr. Honor Junes , doing well lately, A1C at goal   2. Morbid obesity ([place])  Losing weight   3. Major depression in remission ([name])  - buPROPion (WELLBUTRIN XL) 150 MG 24 hr tablet; Take 1 tablet (150 mg total) by mouth daily.  Dispense: 90 tablet; Refill: 1 - sertraline (ZOLOFT) 100 MG tablet; Take 1.5 tablets (150 mg total) by mouth daily.  Dispense: 135 tablet;  Refill: 1  4. Atherosclerosis of aorta ([place])  On statin therapy   5. Chronic heart failure with preserved ejection fraction [facility])  Seen by Cardiologist in the past   6. Adult hypothyroidism  - levothyroxine (SYNTHROID) 25 MCG tablet; Take 1 tablet (25 mcg total) by mouth daily before breakfast. Two on Sunday  Dispense: 100 tablet; Refill: 1  7. Stage 3b chronic kidney disease ([name])  - furosemide (LASIX) 20 MG tablet; Take 1 tablet (20 mg total) by mouth daily.  Dispense: 90 tablet; Refill: 1  8. Amputated toe of left foot ([place])  Due to charcot's foot and infection   9. Charcot foot due to diabetes mellitus ([place])   10. Chronic obstructive pulmonary disease, unspecified COPD type ([place])  Try to resume Brezti  11. Obstructive sleep apnea  - modafinil (PROVIGIL) 100 MG tablet; Take 1 tablet (100 mg total) by mouth daily.  Dispense: 90 tablet; Refill: 0  12. Insomnia due to other mental disorder  - traZODone (DESYREL) 100 MG tablet; Take 1 tablet (100 mg total) by mouth at bedtime as needed for sleep.  Dispense: 90 tablet; Refill: 1  13. Benign hypertension  - amLODipine (NORVASC) 5 MG tablet; Take 1 tablet (5 mg total) by mouth daily.  Dispense: 90 tablet; Refill: 1 - metoprolol tartrate (LOPRESSOR) 25 MG tablet; Take 1 tablet (25 mg total) by mouth 2 (two) times daily.  Dispense: 180 tablet; Refill: 0  14. Gastroesophageal reflux disease without esophagitis  - omeprazole (PRILOSEC) 40 MG capsule; Take 1 capsule (40 mg total) by mouth daily.  Dispense: 90 capsule; Refill: 1   15. Candidal intertrigo  - ketoconazole (NIZORAL) 2 % cream; Apply 1 Application topically daily.  Dispense: 60 g; Refill: 1 -  fluconazole (DIFLUCAN) 150 MG tablet; Take 1 tablet (150 mg total) by mouth every other day.  Dispense: 3 tablet; Refill: 0

## 2022-07-04 ENCOUNTER — Encounter: Payer: Self-pay | Admitting: Family Medicine

## 2022-07-04 ENCOUNTER — Ambulatory Visit (INDEPENDENT_AMBULATORY_CARE_PROVIDER_SITE_OTHER): Payer: No Typology Code available for payment source | Admitting: Family Medicine

## 2022-07-04 VITALS — BP 132/82 | HR 75 | Temp 97.8°F | Resp 18 | Ht 62.0 in | Wt 238.3 lb

## 2022-07-04 DIAGNOSIS — F325 Major depressive disorder, single episode, in full remission: Secondary | ICD-10-CM | POA: Insufficient documentation

## 2022-07-04 DIAGNOSIS — I5032 Chronic diastolic (congestive) heart failure: Secondary | ICD-10-CM | POA: Diagnosis not present

## 2022-07-04 DIAGNOSIS — F5105 Insomnia due to other mental disorder: Secondary | ICD-10-CM | POA: Insufficient documentation

## 2022-07-04 DIAGNOSIS — N1832 Chronic kidney disease, stage 3b: Secondary | ICD-10-CM

## 2022-07-04 DIAGNOSIS — E1169 Type 2 diabetes mellitus with other specified complication: Secondary | ICD-10-CM | POA: Diagnosis not present

## 2022-07-04 DIAGNOSIS — I10 Essential (primary) hypertension: Secondary | ICD-10-CM

## 2022-07-04 DIAGNOSIS — G4733 Obstructive sleep apnea (adult) (pediatric): Secondary | ICD-10-CM | POA: Diagnosis not present

## 2022-07-04 DIAGNOSIS — J449 Chronic obstructive pulmonary disease, unspecified: Secondary | ICD-10-CM

## 2022-07-04 DIAGNOSIS — I700 Atherosclerosis of aorta: Secondary | ICD-10-CM | POA: Diagnosis not present

## 2022-07-04 DIAGNOSIS — E11610 Type 2 diabetes mellitus with diabetic neuropathic arthropathy: Secondary | ICD-10-CM

## 2022-07-04 DIAGNOSIS — S98132A Complete traumatic amputation of one left lesser toe, initial encounter: Secondary | ICD-10-CM | POA: Diagnosis not present

## 2022-07-04 DIAGNOSIS — E785 Hyperlipidemia, unspecified: Secondary | ICD-10-CM

## 2022-07-04 DIAGNOSIS — B372 Candidiasis of skin and nail: Secondary | ICD-10-CM

## 2022-07-04 DIAGNOSIS — F99 Mental disorder, not otherwise specified: Secondary | ICD-10-CM

## 2022-07-04 DIAGNOSIS — K219 Gastro-esophageal reflux disease without esophagitis: Secondary | ICD-10-CM

## 2022-07-04 DIAGNOSIS — E039 Hypothyroidism, unspecified: Secondary | ICD-10-CM | POA: Diagnosis not present

## 2022-07-04 MED ORDER — METOPROLOL TARTRATE 25 MG PO TABS: 25.0000 mg | ORAL_TABLET | Freq: Two times a day (BID) | ORAL | 0 refills | Status: DC

## 2022-07-04 MED ORDER — OMEPRAZOLE 40 MG PO CPDR: 40.0000 mg | DELAYED_RELEASE_CAPSULE | Freq: Every day | ORAL | 1 refills | Status: DC

## 2022-07-04 MED ORDER — MODAFINIL 100 MG PO TABS: 100.0000 mg | ORAL_TABLET | Freq: Every day | ORAL | 0 refills | Status: DC

## 2022-07-04 MED ORDER — TRAZODONE HCL 100 MG PO TABS: 100.0000 mg | ORAL_TABLET | Freq: Every evening | ORAL | 1 refills | Status: DC | PRN

## 2022-07-04 MED ORDER — FLUCONAZOLE 150 MG PO TABS: 150.0000 mg | ORAL_TABLET | ORAL | 0 refills | Status: DC

## 2022-07-04 MED ORDER — KETOCONAZOLE 2 % EX CREA: 1.0000 | TOPICAL_CREAM | Freq: Every day | CUTANEOUS | 1 refills | Status: DC

## 2022-07-04 MED ORDER — SERTRALINE HCL 100 MG PO TABS: 150.0000 mg | ORAL_TABLET | Freq: Every day | ORAL | 1 refills | Status: DC

## 2022-07-04 MED ORDER — BUPROPION HCL ER (XL) 150 MG PO TB24: 150.0000 mg | ORAL_TABLET | Freq: Every day | ORAL | 1 refills | Status: DC

## 2022-07-04 MED ORDER — FUROSEMIDE 20 MG PO TABS: 20.0000 mg | ORAL_TABLET | Freq: Every day | ORAL | 1 refills | Status: DC

## 2022-07-04 MED ORDER — LEVOTHYROXINE SODIUM 25 MCG PO TABS: 25.0000 ug | ORAL_TABLET | Freq: Every day | ORAL | 1 refills | Status: DC

## 2022-07-04 MED ORDER — AMLODIPINE BESYLATE 5 MG PO TABS: 5.0000 mg | ORAL_TABLET | Freq: Every day | ORAL | 1 refills | Status: DC

## 2022-07-04 NOTE — Addendum Note (Signed)
Addended by: Steele Sizer F on: 07/04/2022 03:10 PM   Modules accepted: Orders

## 2022-07-10 ENCOUNTER — Encounter (INDEPENDENT_AMBULATORY_CARE_PROVIDER_SITE_OTHER): Payer: Self-pay

## 2022-07-14 ENCOUNTER — Telehealth: Payer: Self-pay

## 2022-07-14 ENCOUNTER — Telehealth: Payer: Self-pay | Admitting: Family Medicine

## 2022-07-14 DIAGNOSIS — G4733 Obstructive sleep apnea (adult) (pediatric): Secondary | ICD-10-CM

## 2022-07-14 NOTE — Telephone Encounter (Signed)
Requested Prescriptions  Pending Prescriptions Disp Refills   modafinil (PROVIGIL) 100 MG tablet [Pharmacy Med Name: Modafinil 100 MG Oral Tablet] 90 tablet 0    Sig: Take 1 tablet by mouth once daily     Off-Protocol Failed - 07/14/2022  1:21 PM      Failed - Medication not assigned to a protocol, review manually.      Passed - Valid encounter within last 12 months    Recent Outpatient Visits           1 week ago Dyslipidemia associated with type 2 diabetes mellitus [facility])   [facility] Steele Sizer, MD   2 weeks ago [place], DO   3 months ago Morbid obesity [facility])   [facility] Steele Sizer, MD   4 months ago COPD with acute exacerbation [facility])   [facility] Delsa Grana, PA-C   9 months ago Type 2 diabetes mellitus with diabetic polyneuropathy, with long-term current use of insulin [facility])   [facility] Steele Sizer, MD       Future Appointments             In 1 month  [facility], [place]   In 2 months Steele Sizer, MD [facility], [facility]

## 2022-07-14 NOTE — Progress Notes (Signed)
Chronic Care Management Pharmacy Assistant   Name: Summer [name]  MRN: [id number] DOB: 1951/08/09  Reason for Encounter: Diabetes Disease State Call.   Recent office visits:  07/04/2022 Dr. Ancil Boozer MD (PCP) Resume Verl Blalock  06/30/2022 Teodora Medici DO (PCP Office) Start Doxycycline 100 mg 2 times daily 04/04/2022 Dr. Ancil Boozer MD (PCP) No Medication Changes noted, Ambulatory referral to Orthopedic Surgery , Return in about 3 months    Recent consult visits:  06/09/2022 Daylene Katayama DPM (Podiatry) No medication Changes noted 06/06/2022 Daylene Katayama DPM (Podiatry) No medication Changes noted 06/01/2022 Dr. Honor Junes MD (Endocrinology) No medication Changes noted 04/11/2022 Dr. Holley Raring MD (Nephrology) No medication Changes noted, Return in about 4 months   Hospital visits:  None in previous 6 months  Medications: Outpatient Encounter Medications as of 07/14/2022  Medication Sig Note   ACCU-CHEK SOFTCLIX LANCETS lancets     acetaminophen (TYLENOL 8 HOUR) 650 MG CR tablet Take 1 tablet (650 mg total) by mouth every 8 (eight) hours as needed for pain.    albuterol (VENTOLIN HFA) 108 (90 Base) MCG/ACT inhaler Inhale 2 puffs into the lungs every 6 (six) hours as needed for wheezing or shortness of breath.    Alcohol Swabs (B-D SINGLE USE SWABS REGULAR) PADS     amLODipine (NORVASC) 5 MG tablet Take 1 tablet (5 mg total) by mouth daily.    ASPIRIN LOW DOSE 81 MG EC tablet TAKE 1 TABLET (81 MG TOTAL) BY MOUTH DAILY.    Blood Glucose Monitoring Suppl (ACCU-CHEK AVIVA PLUS) w/Device KIT     Budeson-Glycopyrrol-Formoterol (BREZTRI AEROSPHERE) 160-9-4.8 MCG/ACT AERO Inhale 2 puffs into the lungs in the morning and at bedtime.    buPROPion (WELLBUTRIN XL) 150 MG 24 hr tablet Take 1 tablet (150 mg total) by mouth daily.    Cholecalciferol (VITAMIN D3) 25 MCG (1000 UT) CAPS Take 1,000 Units by mouth.    Continuous Blood Gluc Sensor (FREESTYLE LIBRE 2 SENSOR) MISC FOR GLUCOSE MONITORING     Cyanocobalamin (VITAMIN B12 PO) Take 1 tablet by mouth daily.     ezetimibe (ZETIA) 10 MG tablet Take 1 tablet (10 mg total) by mouth daily.    fluconazole (DIFLUCAN) 150 MG tablet Take 1 tablet (150 mg total) by mouth every other day.    furosemide (LASIX) 20 MG tablet Take 1 tablet (20 mg total) by mouth daily.    insulin NPH-regular Human (70-30) 100 UNIT/ML injection Inject 100 Units into the skin daily with supper. 08/26/2020: Pt only taking once daily at night    irbesartan-hydrochlorothiazide (AVALIDE) 300-12.5 MG tablet Take 1 tablet by mouth daily.    ketoconazole (NIZORAL) 2 % cream Apply 1 Application topically daily.    levothyroxine (SYNTHROID) 25 MCG tablet Take 1 tablet (25 mcg total) by mouth daily before breakfast. Two on Sunday    metFORMIN (GLUCOPHAGE) 500 MG tablet Take 1 tablet by mouth 2 (two) times daily with a meal.    metoprolol tartrate (LOPRESSOR) 25 MG tablet Take 1 tablet (25 mg total) by mouth 2 (two) times daily.    Misc Natural Products (OSTEO BI-FLEX ADV JOINT SHIELD PO) Take by mouth 2 (two) times daily.     modafinil (PROVIGIL) 100 MG tablet Take 1 tablet (100 mg total) by mouth daily.    omeprazole (PRILOSEC) 40 MG capsule Take 1 capsule (40 mg total) by mouth daily.    Potassium 99 MG TABS Take by mouth daily at 6 (six) AM.    pregabalin (LYRICA) 300 MG  capsule Take 300 mg by mouth 2 (two) times a day.    Probiotic Product (PROBIOTIC-10 PO) Take by mouth.    RELION INSULIN SYRINGE 1ML/31G 31G X 5/16" 1 ML MISC     rosuvastatin (CRESTOR) 40 MG tablet Take 1 tablet (40 mg total) by mouth daily.    sertraline (ZOLOFT) 100 MG tablet Take 1.5 tablets (150 mg total) by mouth daily.    traZODone (DESYREL) 100 MG tablet Take 1 tablet (100 mg total) by mouth at bedtime as needed for sleep.    VASCEPA 1 g capsule Take 2 capsules (2 g total) by mouth 2 (two) times daily.    No facility-administered encounter medications on file as of 07/14/2022.    Care  Gaps: Ophthalmology Exam   Star Rating Drugs: Irbesartan-HTCZ 300-12.5 mg last filled in 07/11/2022 for 30 day supply at [facility]. Metformin 500 mg last filled in 07/11/2022 for 30 day supply at [facility]. Rosuvastatin 20 mg last filled in 07/11/2022 for 30 day supply at [facility].   Recent Relevant Labs: Lab Results  Component Value Date/Time   HGBA1C 7.5 06/06/2022 12:00 AM   HGBA1C 7.6 12/29/2021 12:00 AM   MICROALBUR 0.6 01/28/2018 03:52 PM   MICROALBUR 20 02/19/2017 10:40 AM   MICROALBUR 20 07/22/2015 02:05 PM    Kidney Function Lab Results  Component Value Date/Time   CREATININE 1.9 (A) 06/20/2022 12:00 AM   CREATININE 1.33 (H) 01/28/2021 03:00 PM   CREATININE 1.21 (H) 11/23/2020 09:37 AM   CREATININE 1.50 (H) 09/24/2020 01:03 PM   CREATININE 1.42 (H) 10/14/2019 11:42 AM   GFRNONAA 40 (L) 01/28/2021 03:00 PM   GFRAA 46 (L) 01/28/2021 03:00 PM    Current antihyperglycemic regimen:  Farxiga 10 mg daily  NPH 70/30 100 units with supper  Metformin 500 mg daily   What recent interventions/DTPs have been made to improve glycemic control:  None ID  Have there been any recent hospitalizations or ED visits since last visit with CPP? No  I have attempted without success to contact this patient by phone three times to do her Diabetes Disease State call.  Adherence Review: Is the patient currently on a STATIN medication? Yes Is the patient currently on ACE/ARB medication? Yes Does the patient have >5 day gap between last estimated fill dates? No   Bessie Kellihan,CPA Clinical Pharmacist Assistant [phone]    

## 2022-07-17 ENCOUNTER — Other Ambulatory Visit: Payer: Self-pay

## 2022-07-17 DIAGNOSIS — G4733 Obstructive sleep apnea (adult) (pediatric): Secondary | ICD-10-CM

## 2022-07-19 DIAGNOSIS — H35342 Macular cyst, hole, or pseudohole, left eye: Secondary | ICD-10-CM | POA: Diagnosis not present

## 2022-07-19 LAB — HM DIABETES EYE EXAM

## 2022-08-10 ENCOUNTER — Emergency Department: Payer: No Typology Code available for payment source

## 2022-08-10 ENCOUNTER — Inpatient Hospital Stay
Admission: EM | Admit: 2022-08-10 | Discharge: 2022-08-28 | DRG: 871 | Disposition: A | Discharge status: Skilled Nursing Facility | Payer: No Typology Code available for payment source | Attending: Student | Admitting: Student

## 2022-08-10 ENCOUNTER — Other Ambulatory Visit: Payer: Self-pay

## 2022-08-10 ENCOUNTER — Encounter: Payer: Self-pay | Admitting: Emergency Medicine

## 2022-08-10 DIAGNOSIS — A4901 Methicillin susceptible Staphylococcus aureus infection, unspecified site: Secondary | ICD-10-CM | POA: Diagnosis not present

## 2022-08-10 DIAGNOSIS — R319 Hematuria, unspecified: Secondary | ICD-10-CM | POA: Diagnosis present

## 2022-08-10 DIAGNOSIS — M2032 Hallux varus (acquired), left foot: Secondary | ICD-10-CM | POA: Diagnosis present

## 2022-08-10 DIAGNOSIS — M1712 Unilateral primary osteoarthritis, left knee: Secondary | ICD-10-CM | POA: Diagnosis not present

## 2022-08-10 DIAGNOSIS — N179 Acute kidney failure, unspecified: Secondary | ICD-10-CM | POA: Diagnosis present

## 2022-08-10 DIAGNOSIS — L97421 Non-pressure chronic ulcer of left heel and midfoot limited to breakdown of skin: Secondary | ICD-10-CM

## 2022-08-10 DIAGNOSIS — E039 Hypothyroidism, unspecified: Secondary | ICD-10-CM | POA: Diagnosis present

## 2022-08-10 DIAGNOSIS — E1165 Type 2 diabetes mellitus with hyperglycemia: Secondary | ICD-10-CM | POA: Diagnosis not present

## 2022-08-10 DIAGNOSIS — L97521 Non-pressure chronic ulcer of other part of left foot limited to breakdown of skin: Secondary | ICD-10-CM | POA: Diagnosis present

## 2022-08-10 DIAGNOSIS — Z515 Encounter for palliative care: Secondary | ICD-10-CM

## 2022-08-10 DIAGNOSIS — A419 Sepsis, unspecified organism: Secondary | ICD-10-CM | POA: Diagnosis present

## 2022-08-10 DIAGNOSIS — G928 Other toxic encephalopathy: Secondary | ICD-10-CM | POA: Diagnosis not present

## 2022-08-10 DIAGNOSIS — J9811 Atelectasis: Secondary | ICD-10-CM | POA: Diagnosis present

## 2022-08-10 DIAGNOSIS — E876 Hypokalemia: Secondary | ICD-10-CM | POA: Diagnosis not present

## 2022-08-10 DIAGNOSIS — M009 Pyogenic arthritis, unspecified: Secondary | ICD-10-CM

## 2022-08-10 DIAGNOSIS — L84 Corns and callosities: Secondary | ICD-10-CM | POA: Diagnosis present

## 2022-08-10 DIAGNOSIS — R7881 Bacteremia: Secondary | ICD-10-CM | POA: Diagnosis present

## 2022-08-10 DIAGNOSIS — A403 Sepsis due to Streptococcus pneumoniae: Principal | ICD-10-CM | POA: Diagnosis present

## 2022-08-10 DIAGNOSIS — E079 Disorder of thyroid, unspecified: Secondary | ICD-10-CM | POA: Diagnosis present

## 2022-08-10 DIAGNOSIS — E538 Deficiency of other specified B group vitamins: Secondary | ICD-10-CM | POA: Diagnosis present

## 2022-08-10 DIAGNOSIS — Z794 Long term (current) use of insulin: Secondary | ICD-10-CM | POA: Diagnosis not present

## 2022-08-10 DIAGNOSIS — T796XXA Traumatic ischemia of muscle, initial encounter: Secondary | ICD-10-CM | POA: Diagnosis not present

## 2022-08-10 DIAGNOSIS — G309 Alzheimer's disease, unspecified: Secondary | ICD-10-CM | POA: Diagnosis not present

## 2022-08-10 DIAGNOSIS — I130 Hypertensive heart and chronic kidney disease with heart failure and stage 1 through stage 4 chronic kidney disease, or unspecified chronic kidney disease: Secondary | ICD-10-CM | POA: Diagnosis not present

## 2022-08-10 DIAGNOSIS — J690 Pneumonitis due to inhalation of food and vomit: Secondary | ICD-10-CM | POA: Diagnosis not present

## 2022-08-10 DIAGNOSIS — R569 Unspecified convulsions: Secondary | ICD-10-CM | POA: Diagnosis not present

## 2022-08-10 DIAGNOSIS — W19XXXA Unspecified fall, initial encounter: Secondary | ICD-10-CM | POA: Diagnosis not present

## 2022-08-10 DIAGNOSIS — N390 Urinary tract infection, site not specified: Secondary | ICD-10-CM | POA: Diagnosis present

## 2022-08-10 DIAGNOSIS — R0689 Other abnormalities of breathing: Secondary | ICD-10-CM | POA: Diagnosis not present

## 2022-08-10 DIAGNOSIS — E1140 Type 2 diabetes mellitus with diabetic neuropathy, unspecified: Secondary | ICD-10-CM | POA: Diagnosis not present

## 2022-08-10 DIAGNOSIS — Z1152 Encounter for screening for COVID-19: Secondary | ICD-10-CM | POA: Diagnosis not present

## 2022-08-10 DIAGNOSIS — Z7984 Long term (current) use of oral hypoglycemic drugs: Secondary | ICD-10-CM | POA: Diagnosis not present

## 2022-08-10 DIAGNOSIS — T380X5A Adverse effect of glucocorticoids and synthetic analogues, initial encounter: Secondary | ICD-10-CM | POA: Diagnosis present

## 2022-08-10 DIAGNOSIS — R1311 Dysphagia, oral phase: Secondary | ICD-10-CM | POA: Diagnosis not present

## 2022-08-10 DIAGNOSIS — D519 Vitamin B12 deficiency anemia, unspecified: Secondary | ICD-10-CM | POA: Diagnosis not present

## 2022-08-10 DIAGNOSIS — G9341 Metabolic encephalopathy: Secondary | ICD-10-CM | POA: Insufficient documentation

## 2022-08-10 DIAGNOSIS — M25562 Pain in left knee: Secondary | ICD-10-CM | POA: Diagnosis not present

## 2022-08-10 DIAGNOSIS — D75839 Thrombocytosis, unspecified: Secondary | ICD-10-CM | POA: Diagnosis not present

## 2022-08-10 DIAGNOSIS — N170 Acute kidney failure with tubular necrosis: Secondary | ICD-10-CM | POA: Diagnosis not present

## 2022-08-10 DIAGNOSIS — I110 Hypertensive heart disease with heart failure: Secondary | ICD-10-CM | POA: Diagnosis not present

## 2022-08-10 DIAGNOSIS — B001 Herpesviral vesicular dermatitis: Secondary | ICD-10-CM | POA: Diagnosis present

## 2022-08-10 DIAGNOSIS — L97529 Non-pressure chronic ulcer of other part of left foot with unspecified severity: Secondary | ICD-10-CM | POA: Diagnosis not present

## 2022-08-10 DIAGNOSIS — E11610 Type 2 diabetes mellitus with diabetic neuropathic arthropathy: Secondary | ICD-10-CM | POA: Diagnosis present

## 2022-08-10 DIAGNOSIS — I6501 Occlusion and stenosis of right vertebral artery: Secondary | ICD-10-CM | POA: Diagnosis not present

## 2022-08-10 DIAGNOSIS — E1142 Type 2 diabetes mellitus with diabetic polyneuropathy: Secondary | ICD-10-CM | POA: Diagnosis not present

## 2022-08-10 DIAGNOSIS — R930 Abnormal findings on diagnostic imaging of skull and head, not elsewhere classified: Secondary | ICD-10-CM | POA: Diagnosis not present

## 2022-08-10 DIAGNOSIS — Z043 Encounter for examination and observation following other accident: Secondary | ICD-10-CM | POA: Diagnosis not present

## 2022-08-10 DIAGNOSIS — E872 Acidosis, unspecified: Secondary | ICD-10-CM | POA: Insufficient documentation

## 2022-08-10 DIAGNOSIS — M00162 Pneumococcal arthritis, left knee: Secondary | ICD-10-CM | POA: Diagnosis not present

## 2022-08-10 DIAGNOSIS — M00262 Other streptococcal arthritis, left knee: Secondary | ICD-10-CM | POA: Diagnosis not present

## 2022-08-10 DIAGNOSIS — N3000 Acute cystitis without hematuria: Secondary | ICD-10-CM | POA: Diagnosis not present

## 2022-08-10 DIAGNOSIS — I6783 Posterior reversible encephalopathy syndrome: Secondary | ICD-10-CM | POA: Diagnosis not present

## 2022-08-10 DIAGNOSIS — B953 Streptococcus pneumoniae as the cause of diseases classified elsewhere: Secondary | ICD-10-CM | POA: Diagnosis not present

## 2022-08-10 DIAGNOSIS — R4182 Altered mental status, unspecified: Secondary | ICD-10-CM | POA: Diagnosis not present

## 2022-08-10 DIAGNOSIS — Z8249 Family history of ischemic heart disease and other diseases of the circulatory system: Secondary | ICD-10-CM

## 2022-08-10 DIAGNOSIS — W182XXA Fall in (into) shower or empty bathtub, initial encounter: Secondary | ICD-10-CM | POA: Diagnosis present

## 2022-08-10 DIAGNOSIS — F331 Major depressive disorder, recurrent, moderate: Secondary | ICD-10-CM | POA: Diagnosis present

## 2022-08-10 DIAGNOSIS — Z6841 Body Mass Index (BMI) 40.0 and over, adult: Secondary | ICD-10-CM | POA: Diagnosis not present

## 2022-08-10 DIAGNOSIS — M109 Gout, unspecified: Secondary | ICD-10-CM | POA: Diagnosis not present

## 2022-08-10 DIAGNOSIS — I959 Hypotension, unspecified: Secondary | ICD-10-CM | POA: Diagnosis not present

## 2022-08-10 DIAGNOSIS — E559 Vitamin D deficiency, unspecified: Secondary | ICD-10-CM | POA: Diagnosis not present

## 2022-08-10 DIAGNOSIS — Z7189 Other specified counseling: Secondary | ICD-10-CM | POA: Diagnosis not present

## 2022-08-10 DIAGNOSIS — G4733 Obstructive sleep apnea (adult) (pediatric): Secondary | ICD-10-CM | POA: Diagnosis present

## 2022-08-10 DIAGNOSIS — M6281 Muscle weakness (generalized): Secondary | ICD-10-CM | POA: Diagnosis not present

## 2022-08-10 DIAGNOSIS — N1832 Chronic kidney disease, stage 3b: Secondary | ICD-10-CM | POA: Diagnosis present

## 2022-08-10 DIAGNOSIS — J90 Pleural effusion, not elsewhere classified: Secondary | ICD-10-CM | POA: Diagnosis not present

## 2022-08-10 DIAGNOSIS — J4489 Other specified chronic obstructive pulmonary disease: Secondary | ICD-10-CM | POA: Diagnosis present

## 2022-08-10 DIAGNOSIS — M1612 Unilateral primary osteoarthritis, left hip: Secondary | ICD-10-CM | POA: Diagnosis not present

## 2022-08-10 DIAGNOSIS — H9190 Unspecified hearing loss, unspecified ear: Secondary | ICD-10-CM | POA: Diagnosis present

## 2022-08-10 DIAGNOSIS — S80919A Unspecified superficial injury of unspecified knee, initial encounter: Secondary | ICD-10-CM | POA: Diagnosis not present

## 2022-08-10 DIAGNOSIS — G9389 Other specified disorders of brain: Secondary | ICD-10-CM | POA: Diagnosis present

## 2022-08-10 DIAGNOSIS — U071 COVID-19: Secondary | ICD-10-CM | POA: Diagnosis not present

## 2022-08-10 DIAGNOSIS — B9689 Other specified bacterial agents as the cause of diseases classified elsewhere: Secondary | ICD-10-CM | POA: Diagnosis present

## 2022-08-10 DIAGNOSIS — M7989 Other specified soft tissue disorders: Secondary | ICD-10-CM | POA: Diagnosis not present

## 2022-08-10 DIAGNOSIS — Z833 Family history of diabetes mellitus: Secondary | ICD-10-CM

## 2022-08-10 DIAGNOSIS — E11621 Type 2 diabetes mellitus with foot ulcer: Secondary | ICD-10-CM | POA: Diagnosis present

## 2022-08-10 DIAGNOSIS — R296 Repeated falls: Secondary | ICD-10-CM

## 2022-08-10 DIAGNOSIS — T68XXXA Hypothermia, initial encounter: Secondary | ICD-10-CM | POA: Diagnosis not present

## 2022-08-10 DIAGNOSIS — J45909 Unspecified asthma, uncomplicated: Secondary | ICD-10-CM | POA: Diagnosis not present

## 2022-08-10 DIAGNOSIS — I5032 Chronic diastolic (congestive) heart failure: Secondary | ICD-10-CM | POA: Diagnosis present

## 2022-08-10 DIAGNOSIS — G9340 Encephalopathy, unspecified: Secondary | ICD-10-CM | POA: Diagnosis not present

## 2022-08-10 DIAGNOSIS — G4453 Primary thunderclap headache: Secondary | ICD-10-CM | POA: Diagnosis present

## 2022-08-10 DIAGNOSIS — M79672 Pain in left foot: Secondary | ICD-10-CM | POA: Diagnosis not present

## 2022-08-10 DIAGNOSIS — R278 Other lack of coordination: Secondary | ICD-10-CM | POA: Diagnosis not present

## 2022-08-10 DIAGNOSIS — Y93E1 Activity, personal bathing and showering: Secondary | ICD-10-CM

## 2022-08-10 DIAGNOSIS — K219 Gastro-esophageal reflux disease without esophagitis: Secondary | ICD-10-CM | POA: Diagnosis present

## 2022-08-10 DIAGNOSIS — E785 Hyperlipidemia, unspecified: Secondary | ICD-10-CM | POA: Diagnosis present

## 2022-08-10 DIAGNOSIS — I5030 Unspecified diastolic (congestive) heart failure: Secondary | ICD-10-CM | POA: Diagnosis not present

## 2022-08-10 DIAGNOSIS — M14672 Charcot's joint, left ankle and foot: Secondary | ICD-10-CM | POA: Diagnosis not present

## 2022-08-10 DIAGNOSIS — E08621 Diabetes mellitus due to underlying condition with foot ulcer: Secondary | ICD-10-CM | POA: Diagnosis not present

## 2022-08-10 DIAGNOSIS — M00862 Arthritis due to other bacteria, left knee: Secondary | ICD-10-CM | POA: Diagnosis not present

## 2022-08-10 DIAGNOSIS — Z66 Do not resuscitate: Secondary | ICD-10-CM | POA: Diagnosis present

## 2022-08-10 DIAGNOSIS — Z7401 Bed confinement status: Secondary | ICD-10-CM | POA: Diagnosis not present

## 2022-08-10 DIAGNOSIS — Z91199 Patient's noncompliance with other medical treatment and regimen due to unspecified reason: Secondary | ICD-10-CM

## 2022-08-10 DIAGNOSIS — M6282 Rhabdomyolysis: Secondary | ICD-10-CM | POA: Diagnosis not present

## 2022-08-10 DIAGNOSIS — G4700 Insomnia, unspecified: Secondary | ICD-10-CM | POA: Diagnosis present

## 2022-08-10 DIAGNOSIS — E1122 Type 2 diabetes mellitus with diabetic chronic kidney disease: Secondary | ICD-10-CM | POA: Diagnosis present

## 2022-08-10 DIAGNOSIS — M25461 Effusion, right knee: Secondary | ICD-10-CM | POA: Diagnosis not present

## 2022-08-10 DIAGNOSIS — I6523 Occlusion and stenosis of bilateral carotid arteries: Secondary | ICD-10-CM | POA: Diagnosis not present

## 2022-08-10 DIAGNOSIS — I6621 Occlusion and stenosis of right posterior cerebral artery: Secondary | ICD-10-CM | POA: Diagnosis not present

## 2022-08-10 DIAGNOSIS — Z83438 Family history of other disorder of lipoprotein metabolism and other lipidemia: Secondary | ICD-10-CM

## 2022-08-10 DIAGNOSIS — M25462 Effusion, left knee: Secondary | ICD-10-CM | POA: Diagnosis not present

## 2022-08-10 DIAGNOSIS — M10062 Idiopathic gout, left knee: Secondary | ICD-10-CM | POA: Diagnosis not present

## 2022-08-10 DIAGNOSIS — M6250 Muscle wasting and atrophy, not elsewhere classified, unspecified site: Secondary | ICD-10-CM | POA: Diagnosis not present

## 2022-08-10 DIAGNOSIS — R2681 Unsteadiness on feet: Secondary | ICD-10-CM | POA: Diagnosis not present

## 2022-08-10 DIAGNOSIS — Z79899 Other long term (current) drug therapy: Secondary | ICD-10-CM

## 2022-08-10 DIAGNOSIS — R6520 Severe sepsis without septic shock: Secondary | ICD-10-CM | POA: Diagnosis not present

## 2022-08-10 LAB — URINALYSIS, ROUTINE W REFLEX MICROSCOPIC
Bilirubin Urine: NEGATIVE
Glucose, UA: >=500 mg/dL — AB
Ketones, ur: 20 mg/dL — AB
Leukocytes,Ua: NEGATIVE
Nitrite: NEGATIVE
Protein, ur: 100 mg/dL — AB
Specific Gravity, Urine: 1.016 (ref 1.005–1.030)
pH: 5 (ref 5.0–8.0)

## 2022-08-10 LAB — CBC WITH DIFFERENTIAL/PLATELET
Abs Immature Granulocytes: 0.26 10*3/uL — ABNORMAL HIGH (ref 0.00–0.07)
Basophils Absolute: 0.1 10*3/uL (ref 0.0–0.1)
Basophils Relative: 0 %
Eosinophils Absolute: 0 10*3/uL (ref 0.0–0.5)
Eosinophils Relative: 0 %
HCT: 35.4 % — ABNORMAL LOW (ref 36.0–46.0)
Hemoglobin: 11.7 g/dL — ABNORMAL LOW (ref 12.0–15.0)
Immature Granulocytes: 1 %
Lymphocytes Relative: 5 %
Lymphs Abs: 0.9 10*3/uL (ref 0.7–4.0)
MCH: 28.2 pg (ref 26.0–34.0)
MCHC: 33.1 g/dL (ref 30.0–36.0)
MCV: 85.3 fL (ref 80.0–100.0)
Monocytes Absolute: 2.2 10*3/uL — ABNORMAL HIGH (ref 0.1–1.0)
Monocytes Relative: 12 %
Neutro Abs: 14.7 10*3/uL — ABNORMAL HIGH (ref 1.7–7.7)
Neutrophils Relative %: 82 %
Platelets: 182 10*3/uL (ref 150–400)
RBC: 4.15 MIL/uL (ref 3.87–5.11)
RDW: 15.4 % (ref 11.5–15.5)
WBC: 18.2 10*3/uL — ABNORMAL HIGH (ref 4.0–10.5)
nRBC: 0 % (ref 0.0–0.2)

## 2022-08-10 LAB — BASIC METABOLIC PANEL
Anion gap: 14 (ref 5–15)
BUN: 35 mg/dL — ABNORMAL HIGH (ref 8–23)
CO2: 21 mmol/L — ABNORMAL LOW (ref 22–32)
Calcium: 10 mg/dL (ref 8.9–10.3)
Chloride: 105 mmol/L (ref 98–111)
Creatinine, Ser: 1.89 mg/dL — ABNORMAL HIGH (ref 0.44–1.00)
GFR, Estimated: 28 mL/min — ABNORMAL LOW (ref >60)
Glucose, Bld: 214 mg/dL — ABNORMAL HIGH (ref 70–99)
Potassium: 4.3 mmol/L (ref 3.5–5.1)
Sodium: 140 mmol/L (ref 135–145)

## 2022-08-10 LAB — CBG MONITORING, ED: Glucose-Capillary: 178 mg/dL — ABNORMAL HIGH (ref 70–99)

## 2022-08-10 LAB — RESP PANEL BY RT-PCR (FLU A&B, COVID) ARPGX2
Influenza A by PCR: NEGATIVE
Influenza B by PCR: NEGATIVE
SARS Coronavirus 2 by RT PCR: NEGATIVE

## 2022-08-10 LAB — CK: Total CK: 6435 U/L — ABNORMAL HIGH (ref 38–234)

## 2022-08-10 LAB — GROUP A STREP BY PCR: Group A Strep by PCR: NOT DETECTED

## 2022-08-10 MED ORDER — SODIUM CHLORIDE 0.9 % IV SOLN
1.0000 g | Freq: Once | INTRAVENOUS | Status: AC
  Administered 2022-08-10: 1 g via INTRAVENOUS
  Filled 2022-08-10: qty 10

## 2022-08-10 MED ORDER — MORPHINE SULFATE (PF) 4 MG/ML IV SOLN
4.0000 mg | Freq: Once | INTRAVENOUS | Status: AC
  Administered 2022-08-10: 4 mg via INTRAVENOUS
  Filled 2022-08-10: qty 1

## 2022-08-10 MED ORDER — ONDANSETRON HCL 4 MG/2ML IJ SOLN
4.0000 mg | Freq: Once | INTRAMUSCULAR | Status: AC
  Administered 2022-08-10: 4 mg via INTRAVENOUS
  Filled 2022-08-10: qty 2

## 2022-08-10 MED ORDER — VANCOMYCIN HCL 2000 MG/400ML IV SOLN
2000.0000 mg | Freq: Once | INTRAVENOUS | Status: AC
  Administered 2022-08-11: 2000 mg via INTRAVENOUS
  Filled 2022-08-10: qty 400

## 2022-08-10 MED ORDER — SODIUM CHLORIDE 0.9 % IV BOLUS
2000.0000 mL | Freq: Once | INTRAVENOUS | Status: AC
  Administered 2022-08-10: 2000 mL via INTRAVENOUS

## 2022-08-10 NOTE — Assessment & Plan Note (Addendum)
Secondary to sepsis and possibly rhabdo Continue IV hydration Hold metformin.  Avoid nephrotoxins  Lab Results  Component Value Date   CREATININE 1.75 (H) 08/13/2022   CREATININE 1.64 (H) 08/12/2022   CREATININE 1.90 (H) 08/11/2022   CREATININE 1.86 (H) 08/11/2022

## 2022-08-10 NOTE — Assessment & Plan Note (Addendum)
Continue insulin Semglee, sliding scale.  Last A1c about 2 months ago was 7.5

## 2022-08-10 NOTE — Assessment & Plan Note (Addendum)
Urinalysis not strongly consistent with UTI we will follow cultures In the interim - continue Rocephin.  ID consult

## 2022-08-10 NOTE — Assessment & Plan Note (Addendum)
Possible infection though low suspicion for the same Continue rocephin and vancomycin started in the ED Follows with podiatry, last seen 9/26.we will consult

## 2022-08-10 NOTE — Assessment & Plan Note (Addendum)
Continue levothyroxine 

## 2022-08-10 NOTE — Assessment & Plan Note (Addendum)
Possible SIRS  related to rhabdo as no definite source of infection Etiology possibly related to UTI Sepsis/SIRS criteria includes fever, tachycardia, leukocytosis and AKI.  Lactic acid is pending Sepsis fluids Will get lactic acid and procalcitonin Empiric antibiotics for possible UTI, possible knee joint infection

## 2022-08-10 NOTE — ED Triage Notes (Signed)
Patient c/o falling two day ago and having pain to left leg. Patients son reports her being "off" before the fall but today pt is at her baseline.

## 2022-08-10 NOTE — ED Provider Triage Note (Signed)
Emergency Medicine Provider Triage Evaluation Note  Summer [name] , a 71 y.o. female  was evaluated in triage.  Pt complains of fall two days ago. No headstrike or LOC. Reports that she was able to walk yesterday but not today. No blood thinners. Reprots she had a sore throat the other day. Son reports that she seemed "off" a couple of days ago, just prior to the fall. He reports that she is now back to her mental baseline. Uses walker  Review of Systems  Positive: Leg pain, sore throat Negative:   Physical Exam  There were no vitals taken for this visit. Gen:   Awake, no distress   Resp:  Normal effort  MSK:   TTP to bilateral knees with ecchymosis. Chronic deformity to left ankle Other:    Medical Decision Making  Medically screening exam initiated at 5:57 PM.  Appropriate orders placed.  RACQUELLE HYSER was informed that the remainder of the evaluation will be completed by another provider, this initial triage assessment does not replace that evaluation, and the importance of remaining in the ED until their evaluation is complete.     Marquette Old, PA-C 08/10/22 1803

## 2022-08-10 NOTE — ED Notes (Signed)
Unsuccessful at lab draw in triage by this RN and NT. Main lab called for labs.

## 2022-08-10 NOTE — Progress Notes (Signed)
PHARMACY -  BRIEF ANTIBIOTIC NOTE   Pharmacy has received consult(s) for Vancomycin from an ED provider.  The patient's profile has been reviewed for ht/wt/allergies/indication/available labs.    One time order(s) placed for Vancomycin 2 gm per pt wt > 100 kg  Further antibiotics/pharmacy consults should be ordered by admitting physician if indicated.                       Thank you, Renda Rolls, PharmD, [facility] 08/10/2022 10:52 PM

## 2022-08-10 NOTE — ED Provider Notes (Signed)
[facility] Provider Note  Patient Contact: 10:42 PM (approximate)   History   Fall   HPI  Summer [name] is a 71 y.o. female who presents the emergency department with multiple complaints.  Patient has had increasing weakness over the past week, multiple falls.  Patient has landed on both knees, but the left knee is worse pain.  Patient fell in the shower yesterday, has had 2 falls the day before.  Patient's son has been able to get the patient on the floor and it does not appear that she has spent any prolonged periods in the floor.  She denies hitting her head on any of her falls.  She states that she is feeling increasingly weak over the past couple of days.  She has multiple bruises and abrasions from her falls.  She has 2 wounds to the left foot, 1 to the great toe and 1 to the lateral foot.  Patient with chronic medical problems to include sleep apnea, chronic kidney disease, diabetes, GERD, hypothyroidism, COPD     Physical Exam   Triage Vital Signs: ED Triage Vitals  Enc Vitals Group     BP 08/10/22 1800 (!) 156/62     Pulse Rate 08/10/22 1800 (!) 116     Resp 08/10/22 1809 20     Temp 08/10/22 1800 100.3 F (37.9 C)     Temp Source 08/10/22 1800 Oral     SpO2 08/10/22 1800 100 %     Weight 08/10/22 1801 241 lb (109.3 kg)     Height 08/10/22 1801 '5\' 2"'$  (1.575 m)     Head Circumference --      Peak Flow --      Pain Score 08/10/22 1809 8     Pain Loc --      Pain Edu? --      Excl. in [place]? --     Most recent vital signs: Vitals:   08/10/22 2245 08/10/22 2248  BP: (!) 174/65 (!) 174/65  Pulse: (!) 120 (!) 117  Resp:  (!) 23  Temp:  (!) 101.1 F (38.4 C)  SpO2: 99% 100%     General: Alert and in no acute distress. Head: No acute traumatic findings ENT:      Ears:       Nose: No congestion/rhinnorhea.      Mouth/Throat: Mucous membranes are moist. Neck: No stridor. No cervical spine tenderness to palpation.  Cardiovascular:  Good  peripheral perfusion Respiratory: Normal respiratory effort without tachypnea or retractions. Lungs CTAB. Good air entry to the bases with no decreased or absent breath sounds. Gastrointestinal: Bowel sounds 4 quadrants. Soft and nontender to palpation. No guarding or rigidity. No palpable masses. No distention. No CVA tenderness Musculoskeletal: Full range of motion to all extremities.  Contusion with superficial abrasion noted to the right elbow.  Full range of motion with no tenderness underlying musculoskeletal structures.  Patient has pulses sensation intact distally.  Patient has bilateral tenderness to her knees without visible abnormality.  Patient is able to flex and extend the knees at this time.  Examination of the left foot reveals what appears to be a diabetic foot wound along the base of the fifth metatarsal region.  There is some edema in this region with minimal erythema.  No purulent drainage.  Area is very tender to palpation.  She has what appears to be a chronic ulceration of her great toe.  There is no purulent drainage and no tenderness  over the toe. Neurologic:  No gross focal neurologic deficits are appreciated.  Cranial nerves II through XII grossly intact. Skin:   No rash noted Other:   ED Results / Procedures / Treatments   Labs (all labs ordered are listed, but only abnormal results are displayed) Labs Reviewed  BASIC METABOLIC PANEL - Abnormal; Notable for the following components:      Result Value   CO2 21 (*)    Glucose, Bld 214 (*)    BUN 35 (*)    Creatinine, Ser 1.89 (*)    GFR, Estimated 28 (*)    All other components within normal limits  CBC WITH DIFFERENTIAL/PLATELET - Abnormal; Notable for the following components:   WBC 18.2 (*)    Hemoglobin 11.7 (*)    HCT 35.4 (*)    Neutro Abs 14.7 (*)    Monocytes Absolute 2.2 (*)    Abs Immature Granulocytes 0.26 (*)    All other components within normal limits  URINALYSIS, ROUTINE W REFLEX MICROSCOPIC -  Abnormal; Notable for the following components:   Color, Urine YELLOW (*)    APPearance HAZY (*)    Glucose, UA >=500 (*)    Hgb urine dipstick LARGE (*)    Ketones, ur 20 (*)    Protein, ur 100 (*)    Bacteria, UA RARE (*)    All other components within normal limits  CK - Abnormal; Notable for the following components:   Total CK 6,435 (*)    All other components within normal limits  CBG MONITORING, ED - Abnormal; Notable for the following components:   Glucose-Capillary 178 (*)    All other components within normal limits  RESP PANEL BY RT-PCR (FLU A&B, COVID) [id number]  GROUP A STREP BY PCR  CULTURE, BLOOD (ROUTINE X 2)  CULTURE, BLOOD (ROUTINE X 2)     EKG     RADIOLOGY  I personally viewed, evaluated, and interpreted these images as part of my medical decision making, as well as reviewing the written report by the radiologist.  ED Provider Interpretation: X-rays of the hip, bilateral knees reveals no fracture.  Patient has x-ray of the left foot which reveals skin ulcerations, soft tissue edema but no evidence of osteomyelitis.  DG Foot 2 Views Left  Result Date: 08/10/2022 CLINICAL DATA:  Left foot pain status post fall EXAM: LEFT FOOT - 2 VIEW COMPARISON:  Radiographs 10/08/2020 FINDINGS: Extensive postoperative changes about the talus and calcaneus. Fusion of the subtalar joint. No acute fracture or dislocation. Transmetatarsal amputation of the fifth metatarsal. Pes planus. Soft tissue swelling diffusely about the foot. Ulcer about the plantar surface of the great toe. IMPRESSION: No acute osseous abnormality. Extensive postoperative changes about the talus and calcaneus. Diffuse soft tissue swelling. Great toe ulcer. Electronically Signed   By: Placido Sou M.D.   On: 08/10/2022 23:08   DG Hip Unilat With Pelvis 2-3 Views Left  Result Date: 08/10/2022 CLINICAL DATA:  Status post fall. EXAM: DG HIP (WITH OR WITHOUT PELVIS) 2-3V LEFT COMPARISON:  None Available.  FINDINGS: There is no evidence of hip fracture or dislocation. Mild to moderate severity degenerative changes are seen in the form of joint space narrowing and acetabular sclerosis. IMPRESSION: Mild to moderate severity degenerative changes without evidence of acute osseous abnormality. Electronically Signed   By: Virgina Norfolk M.D.   On: 08/10/2022 19:57   DG Knee Complete 4 Views Right  Result Date: 08/10/2022 CLINICAL DATA:  Status post fall. EXAM: RIGHT  KNEE - COMPLETE 4+ VIEW COMPARISON:  None Available. FINDINGS: No evidence of an acute fracture or dislocation. Medial and lateral marginal osteophyte formation is noted. There is marked severity patellofemoral joint space narrowing. Moderate to marked severity medial and lateral tibiofemoral compartment space narrowing is also seen. There is a small joint effusion. IMPRESSION: 1. 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 2. Small joint effusion. Electronically Signed   By: Virgina Norfolk M.D.   On: 08/10/2022 19:56   DG Knee Complete 4 Views Left  Result Date: 08/10/2022 CLINICAL DATA:  Status post fall. EXAM: LEFT KNEE - COMPLETE 4+ VIEW COMPARISON:  None Available. FINDINGS: No evidence of an acute fracture or dislocation. Medial and lateral marginal osteophyte formation is seen. There is moderate severity patellofemoral joint space narrowing. Moderate severity medial and lateral tibiofemoral compartment space narrowing is also seen. A small to moderate sized joint effusion is noted. IMPRESSION: 1. Moderate severity tricompartmental osteoarthritis. 2. Small to moderate sized joint effusion. Electronically Signed   By: Virgina Norfolk M.D.   On: 08/10/2022 19:55    PROCEDURES:  Critical Care performed: No  Procedures   MEDICATIONS ORDERED IN ED: Medications  cefTRIAXone (ROCEPHIN) 1 g in sodium chloride 0.9 % 100 mL IVPB (1 g Intravenous New Bag/Given 08/10/22 2343)  vancomycin (VANCOREADY)  IVPB 2000 mg/400 mL (has no administration in time range)  sodium chloride 0.9 % bolus 2,000 mL (2,000 mLs Intravenous New Bag/Given 08/10/22 2340)  morphine (PF) 4 MG/ML injection 4 mg (4 mg Intravenous Given 08/10/22 2335)  ondansetron (ZOFRAN) injection 4 mg (4 mg Intravenous Given 08/10/22 2334)     IMPRESSION / MDM / ASSESSMENT AND PLAN / ED COURSE  I reviewed the triage vital signs and the nursing notes.                              Differential diagnosis includes, but is not limited to, DKA, hyperglycemia, sepsis, UTI, diabetic foot infection, hip fracture, knee injury  Patient's presentation is most consistent with acute presentation with potential threat to life or bodily function.   Patient's diagnosis is consistent with multiple falls, sepsis, UTI, diabetic foot infection.  Patient presents to the emergency department with multiple falls over the last several days.  She has had increasing weakness.  She does not hit her head or lost consciousness during any of these falls.  She was complaining of hip and bilateral knee pain.  Patient denied largely other symptoms.  She was borderline febrile on arrival, slightly tachycardic but this has worsened with indication of evolving sepsis.  Patient has cultures currently drawn, urinalysis concerning for UTI, findings on physical exam for infected diabetic foot ulcer.  Patient has vancomycin and Rocephin ordered.  Fluid administration has been ordered as well.  At this time patient will need to be admitted for evolving sepsis, UTI, diabetic foot infection, weakness as well as findings consistent right with rhabdo.  I discussed the care with hospitalist service for admission.  They agree to accept to their service at this time.     FINAL CLINICAL IMPRESSION(S) / ED DIAGNOSES   Final diagnoses:  Sepsis without acute organ dysfunction, due to unspecified organism [facility])  Multiple falls  Urinary tract infection with hematuria, site unspecified   Diabetic ulcer of left midfoot associated with diabetes mellitus due to underlying condition, limited to breakdown of skin [facility])  Traumatic rhabdomyolysis, initial encounter ([place])     Rx /  DC Orders   ED Discharge Orders     None        Note:  This document was prepared using Dragon voice recognition software and may include unintentional dictation errors.   Darletta Moll, PA-C 08/10/22 2350    Vanessa Remsen, MD 08/11/22 1058

## 2022-08-10 NOTE — Assessment & Plan Note (Addendum)
Clinically euvolemic to dry continue metoprolol.  Will hold furosemide due to AKI

## 2022-08-10 NOTE — Assessment & Plan Note (Addendum)
Continue bupropion, sertraline and trazodone.

## 2022-08-10 NOTE — H&P (Addendum)
History and Physical    Patient: Summer [name] BWL:[id number] DOB: Jan 14, 1951 DOA: 08/10/2022 DOS: the patient was seen and examined on 08/10/2022 PCP: Steele Sizer, MD  Patient coming from: Home  Chief Complaint:  Chief Complaint  Patient presents with   Fall    HPI: Summer [name] is a 71 y.o. female with medical history significant for Diabetes with polyneuropathy with a chronic left foot wound previously seen at the wound care clinic, OSA, CKD stage IIIb, depression, HTN, HFpEF, class III obesity, OSA and hypothyroidism,Who presents to the ED via EMS with frequent falls over the past 3 days, now with severe left knee pain.  Following the fall yesterday, patient laid on the floor for up to an hour as it was difficult to get her up.  She denied preceding lightheadedness, shortness of breath, chest pain, one-sided weakness numbness or tingling.  She denies cough or shortness of breath.  Has had no recent nausea, vomiting, diarrhea or abdominal pain or dysuria. ED course and data review: Tmax of 101.1 with pulse 117, respirations 23 and O2 sat of 100% on room air.  Labs significant for WBC 18,200, lactic acid pending.  Hemoglobin at baseline of 11.7.  Creatinine 1.89 which is above baseline of 1.33.  Blood glucose 214.  CK elevated at 6435.  Urinalysis shows rare bacteria but negative nitrite and leukocyte esterase.  COVID and flu negative.  Group A strep not detected.  EKG not done in the ED. Patient had x-rays of the left foot, left hip and pelvis, left knee and right knees which altogether showed no acute traumatic injury.  It did show her left great toe ulcer. Patient was started on vancomycin and Rocephin and hospitalist consulted for admission.   Review of Systems: As mentioned in the history of present illness. All other systems reviewed and are negative.  Past Medical History:  Diagnosis Date   Anemia    Arthritis    Asthma    B12 deficiency    Back pain    Carpal tunnel syndrome     Chronic kidney disease    Constipation    Depression    Depressive disorder    Diabetes mellitus    Dyspnea    Fluid retention    Foot pain    GERD (gastroesophageal reflux disease)    Headache    History of hiatal hernia    Hyperlipidemia    Hypertension    IBS (irritable bowel syndrome)    Insomnia    Joint pain    Lumbago    Memory loss    Obesity    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)    Pneumonia    Rhinitis, allergic    Rosacea    Sleep apnea    SOB (shortness of breath)    Thyroid disease    Unspecified hearing loss    Unspecified hereditary and idiopathic peripheral neuropathy    Unspecified sleep apnea    Vitamin D deficiency    Past Surgical History:  Procedure Laterality Date   ABDOMINAL HYSTERECTOMY  1975   ANKLE SURGERY Left approx Jan 2018   APPENDECTOMY  1970   CATARACT EXTRACTION  01/2011   right   COLONOSCOPY WITH PROPOFOL N/A 08/19/2019   Procedure: COLONOSCOPY WITH PROPOFOL;  Surgeon: Jonathon Bellows, MD;  Location: [facility] ENDOSCOPY;  Service: Gastroenterology;  Laterality: N/A;   eye lid surgery  2013   bilateral   FOOT SURGERY     LOWER EXTREMITY ANGIOGRAPHY Left 11/23/2020  Procedure: LOWER EXTREMITY ANGIOGRAPHY;  Surgeon: Katha Cabal, MD;  Location: [place] [facility];  Service: Cardiovascular;  Laterality: Left;   NECK SURGERY     SPINE SURGERY     TUBAL LIGATION     VAGINAL HYSTERECTOMY  1989   Social History:  reports that she has never smoked. She has never used smokeless tobacco. She reports that she does not drink alcohol and does not use drugs.  Allergies  Allergen Reactions   Codeine Other (See Comments)    "TRIPPED OUT"  DIDN'T LIKE THE WAY IT FELT   Atorvastatin     muscle pain   Hydrocodone     itching   Tramadol    Latex Rash   Zolpidem Other (See Comments)    Sleep walk    Family History  Problem Relation Age of Onset   Aneurysm Mother    Aortic aneurysm Mother    Hypertension Mother    Hyperlipidemia Mother     Heart disease Mother    Obesity Mother    Heart attack Maternal Grandfather    Diabetes Maternal Grandfather     Prior to Admission medications   Medication Sig Start Date End Date Taking? Authorizing Provider  ACCU-CHEK SOFTCLIX LANCETS lancets  08/26/17  Yes [provider]  Alcohol Swabs (B-D SINGLE USE SWABS REGULAR) PADS  01/04/18  Yes [provider]  amLODipine (NORVASC) 5 MG tablet Take 1 tablet (5 mg total) by mouth daily. 07/04/22  Yes Sowles, Drue Stager, MD  ASPIRIN LOW DOSE 81 MG EC tablet TAKE 1 TABLET (81 MG TOTAL) BY MOUTH DAILY. 05/31/16  Yes Sowles, Drue Stager, MD  Blood Glucose Monitoring Suppl (ACCU-CHEK AVIVA PLUS) w/Device KIT  11/02/15  Yes [provider]  Budeson-Glycopyrrol-Formoterol (BREZTRI AEROSPHERE) 160-9-4.8 MCG/ACT AERO Inhale 2 puffs into the lungs in the morning and at bedtime. 10/19/21  Yes Kasa, Maretta Bees, MD  buPROPion (WELLBUTRIN XL) 150 MG 24 hr tablet Take 1 tablet (150 mg total) by mouth daily. 07/04/22  Yes Sowles, Drue Stager, MD  Cholecalciferol (VITAMIN D3) 25 MCG (1000 UT) CAPS Take 1,000 Units by mouth.   Yes [provider]  Continuous Blood Gluc Sensor (FREESTYLE LIBRE 2 SENSOR) MISC FOR GLUCOSE MONITORING 06/27/22  Yes [provider]  Cyanocobalamin (VITAMIN B12 PO) Take 1 tablet by mouth daily.    Yes [provider]  ezetimibe (ZETIA) 10 MG tablet Take 1 tablet (10 mg total) by mouth daily. 05/22/22  Yes Sowles, Drue Stager, MD  furosemide (LASIX) 20 MG tablet Take 1 tablet (20 mg total) by mouth daily. 07/04/22  Yes Sowles, Drue Stager, MD  insulin NPH-regular Human (70-30) 100 UNIT/ML injection Inject 100 Units into the skin daily with supper.   Yes Lonia Farber, MD  irbesartan-hydrochlorothiazide (AVALIDE) 300-12.5 MG tablet Take 1 tablet by mouth daily. 04/14/22  Yes Sowles, Drue Stager, MD  ketoconazole (NIZORAL) 2 % cream Apply 1 Application topically daily. 07/04/22  Yes Sowles, Drue Stager, MD  levothyroxine  (SYNTHROID) 25 MCG tablet Take 1 tablet (25 mcg total) by mouth daily before breakfast. Two on Sunday 07/04/22  Yes Sowles, Drue Stager, MD  metFORMIN (GLUCOPHAGE) 500 MG tablet Take 1 tablet by mouth 2 (two) times daily with a meal.   Yes Lonia Farber, MD  metoprolol tartrate (LOPRESSOR) 25 MG tablet Take 1 tablet (25 mg total) by mouth 2 (two) times daily. 07/04/22  Yes Sowles, Drue Stager, MD  Misc Natural Products (OSTEO BI-FLEX ADV JOINT SHIELD PO) Take by mouth 2 (two) times daily.  Yes [provider]  modafinil (PROVIGIL) 100 MG tablet Take 1 tablet (100 mg total) by mouth daily. 07/04/22  Yes Sowles, Drue Stager, MD  montelukast (SINGULAIR) 10 MG tablet Take 10 mg by mouth daily. 08/10/22  Yes [provider]  omeprazole (PRILOSEC) 40 MG capsule Take 1 capsule (40 mg total) by mouth daily. 07/04/22  Yes Sowles, Drue Stager, MD  Potassium 99 MG TABS Take by mouth daily at 6 (six) AM.   Yes [provider]  pregabalin (LYRICA) 300 MG capsule Take 300 mg by mouth 2 (two) times a day. 03/06/19  Yes Lonia Farber, MD  Probiotic Product (PROBIOTIC-10 PO) Take by mouth.   Yes [provider]  [place] 1ML/31G 31G X 5/16" 1 ML [place]  12/19/15  Yes [provider]  rosuvastatin (CRESTOR) 40 MG tablet Take 1 tablet (40 mg total) by mouth daily. 04/14/22  Yes Sowles, Drue Stager, MD  sertraline (ZOLOFT) 100 MG tablet Take 1.5 tablets (150 mg total) by mouth daily. 07/04/22  Yes Sowles, Drue Stager, MD  VASCEPA 1 g capsule Take 2 capsules (2 g total) by mouth 2 (two) times daily. 04/04/22  Yes Sowles, Drue Stager, MD  acetaminophen (TYLENOL 8 HOUR) 650 MG CR tablet Take 1 tablet (650 mg total) by mouth every 8 (eight) hours as needed for pain. 10/16/18   Steele Sizer, MD  albuterol (VENTOLIN HFA) 108 (90 Base) MCG/ACT inhaler Inhale 2 puffs into the lungs every 6 (six) hours as needed for wheezing or shortness of breath. 01/05/22   Flora Lipps, MD  fluconazole  (DIFLUCAN) 150 MG tablet Take 1 tablet (150 mg total) by mouth every other day. Patient not taking: Reported on 08/10/2022 07/04/22   Steele Sizer, MD  traZODone (DESYREL) 100 MG tablet Take 1 tablet (100 mg total) by mouth at bedtime as needed for sleep. 07/04/22   Steele Sizer, MD    Physical Exam: Vitals:   08/10/22 1801 08/10/22 1809 08/10/22 2245 08/10/22 2248  BP:   (!) 174/65 (!) 174/65  Pulse:   (!) 120 (!) 117  Resp:  20  (!) 23  Temp:    (!) 101.1 F (38.4 C)  TempSrc:    Oral  SpO2:   99% 100%  Weight: 109.3 kg     Height: _0  (1.575 m)      Physical Exam Vitals and nursing note reviewed.  Constitutional:      General: She is not in acute distress.    Comments: Patient is groaning in pain from her left knee  HENT:     Head: Normocephalic and atraumatic.  Cardiovascular:     Rate and Rhythm: Regular rhythm. Tachycardia present.     Heart sounds: Normal heart sounds.  Pulmonary:     Effort: Pulmonary effort is normal.     Breath sounds: Normal breath sounds.  Abdominal:     Palpations: Abdomen is soft.     Tenderness: There is no abdominal tenderness.  Musculoskeletal:     Left knee: Swelling and effusion present. Decreased range of motion. Tenderness present.  Neurological:     Mental Status: Mental status is at baseline.        Labs on Admission: I have personally reviewed following labs and imaging studies  CBC: Recent Labs  Lab 08/10/22 1830  WBC 18.2*  NEUTROABS 14.7*  HGB 11.7*  HCT 35.4*  MCV 85.3  PLT 858   Basic Metabolic Panel: Recent Labs  Lab 08/10/22 1830  NA 140  K  4.3  CL 105  CO2 21*  GLUCOSE 214*  BUN 35*  CREATININE 1.89*  CALCIUM 10.0   GFR: Estimated Creatinine Clearance: 30.9 mL/min (A) (by C-G formula based on SCr of 1.89 mg/dL (H)). Liver Function Tests: No results for input(s): "AST", "ALT", "ALKPHOS", "BILITOT", "PROT", "ALBUMIN" in the last 168 hours. No results for input(s): "LIPASE", "AMYLASE" in  the last 168 hours. No results for input(s): "AMMONIA" in the last 168 hours. Coagulation Profile: No results for input(s): "INR", "PROTIME" in the last 168 hours. Cardiac Enzymes: Recent Labs  Lab 08/10/22 1830  CKTOTAL 6,435*   BNP (last 3 results) No results for input(s): "PROBNP" in the last 8760 hours. HbA1C: No results for input(s): "HGBA1C" in the last 72 hours. CBG: Recent Labs  Lab 08/10/22 2247  GLUCAP 178*   Lipid Profile: No results for input(s): "CHOL", "HDL", "LDLCALC", "TRIG", "CHOLHDL", "LDLDIRECT" in the last 72 hours. Thyroid Function Tests: No results for input(s): "TSH", "T4TOTAL", "FREET4", "T3FREE", "THYROIDAB" in the last 72 hours. Anemia Panel: No results for input(s): "VITAMINB12", "FOLATE", "FERRITIN", "TIBC", "IRON", "RETICCTPCT" in the last 72 hours. Urine analysis:    Component Value Date/Time   COLORURINE YELLOW (A) 08/10/2022 1810   APPEARANCEUR HAZY (A) 08/10/2022 1810   LABSPEC 1.016 08/10/2022 1810   PHURINE 5.0 08/10/2022 1810   GLUCOSEU >=500 (A) 08/10/2022 1810   HGBUR LARGE (A) 08/10/2022 1810   BILIRUBINUR NEGATIVE 08/10/2022 1810   BILIRUBINUR negative 01/30/2018 1233   KETONESUR 20 (A) 08/10/2022 1810   PROTEINUR 100 (A) 08/10/2022 1810   UROBILINOGEN negative (A) 01/30/2018 1233   NITRITE NEGATIVE 08/10/2022 1810   LEUKOCYTESUR NEGATIVE 08/10/2022 1810    Radiological Exams on Admission: DG Foot 2 Views Left  Result Date: 08/10/2022 CLINICAL DATA:  Left foot pain status post fall EXAM: LEFT FOOT - 2 VIEW COMPARISON:  Radiographs 10/08/2020 FINDINGS: Extensive postoperative changes about the talus and calcaneus. Fusion of the subtalar joint. No acute fracture or dislocation. Transmetatarsal amputation of the fifth metatarsal. Pes planus. Soft tissue swelling diffusely about the foot. Ulcer about the plantar surface of the great toe. IMPRESSION: No acute osseous abnormality. Extensive postoperative changes about the talus and  calcaneus. Diffuse soft tissue swelling. Great toe ulcer. Electronically Signed   By: Placido Sou M.D.   On: 08/10/2022 23:08   DG Hip Unilat With Pelvis 2-3 Views Left  Result Date: 08/10/2022 CLINICAL DATA:  Status post fall. EXAM: DG HIP (WITH OR WITHOUT PELVIS) 2-3V LEFT COMPARISON:  None Available. FINDINGS: There is no evidence of hip fracture or dislocation. Mild to moderate severity degenerative changes are seen in the form of joint space narrowing and acetabular sclerosis. IMPRESSION: Mild to moderate severity degenerative changes without evidence of acute osseous abnormality. Electronically Signed   By: Virgina Norfolk M.D.   On: 08/10/2022 19:57   DG Knee Complete 4 Views Right  Result Date: 08/10/2022 CLINICAL DATA:  Status post fall. EXAM: RIGHT KNEE - COMPLETE 4+ VIEW COMPARISON:  None Available. FINDINGS: No evidence of an acute fracture or dislocation. Medial and lateral marginal osteophyte formation is noted. There is marked severity patellofemoral joint space narrowing. Moderate to marked severity medial and lateral tibiofemoral compartment space narrowing is also seen. There is a small joint effusion. IMPRESSION: 1. 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 2. Small joint effusion. Electronically Signed   By: Virgina Norfolk M.D.   On: 08/10/2022 19:56   DG Knee Complete 4 Views Left  Result Date: 08/10/2022  CLINICAL DATA:  Status post fall. EXAM: LEFT KNEE - COMPLETE 4+ VIEW COMPARISON:  None Available. FINDINGS: No evidence of an acute fracture or dislocation. Medial and lateral marginal osteophyte formation is seen. There is moderate severity patellofemoral joint space narrowing. Moderate severity medial and lateral tibiofemoral compartment space narrowing is also seen. A small to moderate sized joint effusion is noted. IMPRESSION: 1. Moderate severity tricompartmental osteoarthritis. 2. Small to moderate sized joint effusion.  Electronically Signed   By: Virgina Norfolk M.D.   On: 08/10/2022 19:55     Data Reviewed: Relevant notes from primary care and specialist visits, past discharge summaries as available in EHR, including Care Everywhere. Prior diagnostic testing as pertinent to current admission diagnoses Updated medications and problem lists for reconciliation ED course, including vitals, labs, imaging, treatment and response to treatment Triage notes, nursing and pharmacy notes and ED provider's notes Notable results as noted in HPI   Assessment and Plan: * Sepsis ([place]) Possible SIRS  related to rhabdo as no definite source of infection Etiology possibly related to UTI Sepsis/SIRS criteria includes fever, tachycardia, leukocytosis and AKI.  Lactic acid is pending Sepsis fluids Will get lactic acid and procalcitonin Empiric antibiotics for possible UTI, possible knee joint infection   Urinary tract infection Urinalysis not strongly consistent with UTI we will follow cultures In the interim we will continue Rocephin  Traumatic rhabdomyolysis ([name]) Frequent falls CK over 6000 IV fluid hydration PT eval and fall precautions  Acute pain of left knee Patient with severe 10/10 pain left knee Left knee xray IMPRESSION: 1. Moderate severity tricompartmental osteoarthritis. 2. Small to moderate sized joint effusion  Pain control Ortho consulted for aspiration PT eval  Ulcer of left foot due to type 2 diabetes mellitus ([place]) Possible infection though low suspicion for the same Continue rocephin and vancomycin started in the ED Follows with podiatry, last seen 9/26.we will consult  Uncontrolled type 2 diabetes mellitus with hyperglycemia, with long-term current use of insulin ([name]) Blood sugar 214 We will hold home 7030 and give [place] with sliding scale coverage  Acute renal failure superimposed on stage 3b chronic kidney disease ([place]) Secondary to sepsis and possibly rhabdo IV  hydration Hold metformin.  Avoid nephrotoxins Continue to monitor  Major depressive disorder, recurrent episode, moderate ([name]) Continue bupropion, sertraline and trazodone  (HFpEF) heart failure with preserved ejection fraction ([name]) Clinically euvolemic to dry We will continue metoprolol.  Will hold furosemide due to AKI  Morbid obesity with BMI of 40.0-44.9, adult ([name]) Complicating factor to overall prognosis and care  Adult hypothyroidism Continue levothyroxine  Obstructive sleep apnea Continue CPAP     DVT prophylaxis: Lovenox  Consults: [facility] podiatry, Dr. Posey Pronto  Advance Care Planning: DNR  Family Communication: none  Disposition Plan: Back to previous home environment  Severity of Illness: The appropriate patient status for this patient is INPATIENT. Inpatient status is judged to be reasonable and necessary in order to provide the required intensity of service to ensure the patient's safety. The patient's presenting symptoms, physical exam findings, and initial radiographic and laboratory data in the context of their chronic comorbidities is felt to place them at high risk for further clinical deterioration. Furthermore, it is not anticipated that the patient will be medically stable for discharge from the hospital within 2 midnights of admission.   * I certify that at the point of admission it is my clinical judgment that the patient will require inpatient hospital care spanning beyond 2 midnights from the point  of admission due to high intensity of service, high risk for further deterioration and high frequency of surveillance required.*  Author: Athena Masse, MD 08/10/2022 11:41 PM  For on call review [email or web address].

## 2022-08-10 NOTE — Assessment & Plan Note (Addendum)
Complicating factor to overall prognosis and care 

## 2022-08-10 NOTE — ED Triage Notes (Signed)
Arrives from home via [facility].  C/O left knee pain from fall in shower yesterday.  Hx HTN, Afib, DM .  Has not taken any medications today.  Left leg is shorter than right at baseline per patient.  Just c/o left knee pain.  156/109 87 16 100 RA

## 2022-08-10 NOTE — Assessment & Plan Note (Addendum)
Frequent falls CK over 6000-> 3308 Continue IV hydration  PT, OT recommend SNF.  TOC aware looking for placement

## 2022-08-10 NOTE — Assessment & Plan Note (Addendum)
Continue CPAP.  

## 2022-08-11 ENCOUNTER — Inpatient Hospital Stay: Payer: No Typology Code available for payment source

## 2022-08-11 DIAGNOSIS — E872 Acidosis, unspecified: Secondary | ICD-10-CM | POA: Insufficient documentation

## 2022-08-11 DIAGNOSIS — T796XXA Traumatic ischemia of muscle, initial encounter: Secondary | ICD-10-CM | POA: Diagnosis not present

## 2022-08-11 DIAGNOSIS — L97529 Non-pressure chronic ulcer of other part of left foot with unspecified severity: Secondary | ICD-10-CM | POA: Diagnosis not present

## 2022-08-11 DIAGNOSIS — M25562 Pain in left knee: Secondary | ICD-10-CM | POA: Diagnosis present

## 2022-08-11 DIAGNOSIS — J90 Pleural effusion, not elsewhere classified: Secondary | ICD-10-CM | POA: Diagnosis not present

## 2022-08-11 DIAGNOSIS — A419 Sepsis, unspecified organism: Secondary | ICD-10-CM | POA: Diagnosis not present

## 2022-08-11 DIAGNOSIS — N3000 Acute cystitis without hematuria: Secondary | ICD-10-CM

## 2022-08-11 DIAGNOSIS — E11621 Type 2 diabetes mellitus with foot ulcer: Secondary | ICD-10-CM | POA: Diagnosis not present

## 2022-08-11 DIAGNOSIS — J690 Pneumonitis due to inhalation of food and vomit: Secondary | ICD-10-CM | POA: Diagnosis not present

## 2022-08-11 DIAGNOSIS — M10062 Idiopathic gout, left knee: Secondary | ICD-10-CM | POA: Diagnosis not present

## 2022-08-11 LAB — BASIC METABOLIC PANEL
Anion gap: 15 (ref 5–15)
BUN: 33 mg/dL — ABNORMAL HIGH (ref 8–23)
CO2: 16 mmol/L — ABNORMAL LOW (ref 22–32)
Calcium: 9.1 mg/dL (ref 8.9–10.3)
Chloride: 108 mmol/L (ref 98–111)
Creatinine, Ser: 1.86 mg/dL — ABNORMAL HIGH (ref 0.44–1.00)
GFR, Estimated: 28 mL/min — ABNORMAL LOW (ref >60)
Glucose, Bld: 220 mg/dL — ABNORMAL HIGH (ref 70–99)
Potassium: 4.1 mmol/L (ref 3.5–5.1)
Sodium: 139 mmol/L (ref 135–145)

## 2022-08-11 LAB — BLOOD CULTURE ID PANEL (REFLEXED) - BCID2

## 2022-08-11 LAB — CBC
HCT: 30.5 % — ABNORMAL LOW (ref 36.0–46.0)
Hemoglobin: 10.2 g/dL — ABNORMAL LOW (ref 12.0–15.0)
MCH: 28.3 pg (ref 26.0–34.0)
MCHC: 33.4 g/dL (ref 30.0–36.0)
MCV: 84.5 fL (ref 80.0–100.0)
Platelets: 179 10*3/uL (ref 150–400)
RBC: 3.61 MIL/uL — ABNORMAL LOW (ref 3.87–5.11)
RDW: 15.6 % — ABNORMAL HIGH (ref 11.5–15.5)
WBC: 17.5 10*3/uL — ABNORMAL HIGH (ref 4.0–10.5)
nRBC: 0 % (ref 0.0–0.2)

## 2022-08-11 LAB — SYNOVIAL CELL COUNT + DIFF, W/ CRYSTALS
Eosinophils-Synovial: 0 %
Lymphocytes-Synovial Fld: 2 %
Monocyte-Macrophage-Synovial Fluid: 5 %
Neutrophil, Synovial: 93 %
WBC, Synovial: 38123 /mm3 — ABNORMAL HIGH (ref 0–200)

## 2022-08-11 LAB — CBG MONITORING, ED
Glucose-Capillary: 201 mg/dL — ABNORMAL HIGH (ref 70–99)
Glucose-Capillary: 191 mg/dL — ABNORMAL HIGH (ref 70–99)
Glucose-Capillary: 216 mg/dL — ABNORMAL HIGH (ref 70–99)
Glucose-Capillary: 231 mg/dL — ABNORMAL HIGH (ref 70–99)
Glucose-Capillary: 235 mg/dL — ABNORMAL HIGH (ref 70–99)

## 2022-08-11 LAB — LACTIC ACID, PLASMA
Lactic Acid, Venous: 0.8 mmol/L (ref 0.5–1.9)
Lactic Acid, Venous: 1 mmol/L (ref 0.5–1.9)

## 2022-08-11 LAB — PROCALCITONIN: Procalcitonin: 1.64 ng/mL

## 2022-08-11 LAB — PROTIME-INR
INR: 1.4 — ABNORMAL HIGH (ref 0.8–1.2)
Prothrombin Time: 17 seconds — ABNORMAL HIGH (ref 11.4–15.2)

## 2022-08-11 LAB — CREATININE, SERUM
Creatinine, Ser: 1.9 mg/dL — ABNORMAL HIGH (ref 0.44–1.00)
GFR, Estimated: 28 mL/min — ABNORMAL LOW (ref >60)

## 2022-08-11 LAB — CORTISOL-AM, BLOOD: Cortisol - AM: 33.5 ug/dL — ABNORMAL HIGH (ref 6.7–22.6)

## 2022-08-11 MED ORDER — ALBUTEROL SULFATE HFA 108 (90 BASE) MCG/ACT IN AERS: 2.0000 | INHALATION_SPRAY | Freq: Four times a day (QID) | RESPIRATORY_TRACT | Status: DC | PRN

## 2022-08-11 MED ORDER — BUPROPION HCL ER (XL) 150 MG PO TB24
150.0000 mg | ORAL_TABLET | Freq: Every day | ORAL | Status: DC
  Administered 2022-08-11 – 2022-08-19 (×9): 150 mg via ORAL
  Filled 2022-08-11 (×10): qty 1

## 2022-08-11 MED ORDER — METOPROLOL TARTRATE 25 MG PO TABS
25.0000 mg | ORAL_TABLET | Freq: Two times a day (BID) | ORAL | Status: DC
  Administered 2022-08-11 – 2022-08-22 (×22): 25 mg via ORAL
  Filled 2022-08-11 (×25): qty 1

## 2022-08-11 MED ORDER — MORPHINE SULFATE (PF) 2 MG/ML IV SOLN: 2.0000 mg | INTRAVENOUS | Status: DC | PRN

## 2022-08-11 MED ORDER — INSULIN ASPART 100 UNIT/ML IJ SOLN
0.0000 [IU] | Freq: Every day | INTRAMUSCULAR | Status: DC
  Administered 2022-08-11 – 2022-08-22 (×6): 2 [IU] via SUBCUTANEOUS
  Administered 2022-08-23: 3 [IU] via SUBCUTANEOUS
  Administered 2022-08-24: 4 [IU] via SUBCUTANEOUS
  Administered 2022-08-25: 3 [IU] via SUBCUTANEOUS
  Administered 2022-08-26: 2 [IU] via SUBCUTANEOUS
  Administered 2022-08-27: 3 [IU] via SUBCUTANEOUS
  Filled 2022-08-11 (×11): qty 1

## 2022-08-11 MED ORDER — HYDROCODONE-ACETAMINOPHEN 5-325 MG PO TABS
1.0000 | ORAL_TABLET | ORAL | Status: DC | PRN
  Administered 2022-08-11 – 2022-08-17 (×17): 2 via ORAL
  Administered 2022-08-18: 1 via ORAL
  Administered 2022-08-19 – 2022-08-22 (×4): 2 via ORAL
  Administered 2022-08-23: 1 via ORAL
  Administered 2022-08-23: 2 via ORAL
  Administered 2022-08-24: 1 via ORAL
  Administered 2022-08-24: 2 via ORAL
  Administered 2022-08-25: 1 via ORAL
  Administered 2022-08-27 – 2022-08-28 (×2): 2 via ORAL
  Filled 2022-08-11 (×2): qty 2
  Filled 2022-08-11: qty 1
  Filled 2022-08-11 (×5): qty 2
  Filled 2022-08-11 (×2): qty 1
  Filled 2022-08-11 (×2): qty 2
  Filled 2022-08-11: qty 1
  Filled 2022-08-11 (×17): qty 2

## 2022-08-11 MED ORDER — LEVOTHYROXINE SODIUM 50 MCG PO TABS
50.0000 ug | ORAL_TABLET | ORAL | Status: DC
  Administered 2022-08-20 – 2022-08-27 (×2): 50 ug via ORAL
  Filled 2022-08-11 (×3): qty 1

## 2022-08-11 MED ORDER — INSULIN GLARGINE-YFGN 100 UNIT/ML ~~LOC~~ SOLN
10.0000 [IU] | Freq: Every day | SUBCUTANEOUS | Status: DC
  Administered 2022-08-11 – 2022-08-18 (×8): 10 [IU] via SUBCUTANEOUS
  Filled 2022-08-11 (×10): qty 0.1

## 2022-08-11 MED ORDER — ACETAMINOPHEN 325 MG PO TABS
650.0000 mg | ORAL_TABLET | Freq: Four times a day (QID) | ORAL | Status: DC | PRN
  Administered 2022-08-12 – 2022-08-28 (×15): 650 mg via ORAL
  Filled 2022-08-11 (×15): qty 2

## 2022-08-11 MED ORDER — ALBUTEROL SULFATE (2.5 MG/3ML) 0.083% IN NEBU: 2.5000 mg | INHALATION_SOLUTION | Freq: Four times a day (QID) | RESPIRATORY_TRACT | Status: DC | PRN

## 2022-08-11 MED ORDER — PREDNISONE 20 MG PO TABS
40.0000 mg | ORAL_TABLET | Freq: Every day | ORAL | Status: DC
  Administered 2022-08-11 – 2022-08-14 (×4): 40 mg via ORAL
  Filled 2022-08-11 (×4): qty 2

## 2022-08-11 MED ORDER — INSULIN ASPART 100 UNIT/ML IJ SOLN
0.0000 [IU] | Freq: Three times a day (TID) | INTRAMUSCULAR | Status: DC
  Administered 2022-08-11: 4 [IU] via SUBCUTANEOUS
  Administered 2022-08-11 (×2): 7 [IU] via SUBCUTANEOUS
  Administered 2022-08-12: 4 [IU] via SUBCUTANEOUS
  Administered 2022-08-12: 11 [IU] via SUBCUTANEOUS
  Administered 2022-08-12: 7 [IU] via SUBCUTANEOUS
  Administered 2022-08-13 (×2): 4 [IU] via SUBCUTANEOUS
  Administered 2022-08-13 – 2022-08-14 (×4): 7 [IU] via SUBCUTANEOUS
  Administered 2022-08-15 (×3): 4 [IU] via SUBCUTANEOUS
  Administered 2022-08-16: 7 [IU] via SUBCUTANEOUS
  Administered 2022-08-17 – 2022-08-18 (×4): 4 [IU] via SUBCUTANEOUS
  Administered 2022-08-18 (×2): 3 [IU] via SUBCUTANEOUS
  Administered 2022-08-19 – 2022-08-20 (×4): 7 [IU] via SUBCUTANEOUS
  Administered 2022-08-20: 4 [IU] via SUBCUTANEOUS
  Administered 2022-08-20: 11 [IU] via SUBCUTANEOUS
  Administered 2022-08-21: 15 [IU] via SUBCUTANEOUS
  Administered 2022-08-21: 11 [IU] via SUBCUTANEOUS
  Administered 2022-08-21: 7 [IU] via SUBCUTANEOUS
  Administered 2022-08-22: 15 [IU] via SUBCUTANEOUS
  Administered 2022-08-22: 11 [IU] via SUBCUTANEOUS
  Administered 2022-08-22 – 2022-08-23 (×3): 4 [IU] via SUBCUTANEOUS
  Administered 2022-08-23: 7 [IU] via SUBCUTANEOUS
  Administered 2022-08-24: 4 [IU] via SUBCUTANEOUS
  Administered 2022-08-24: 15 [IU] via SUBCUTANEOUS
  Administered 2022-08-24: 7 [IU] via SUBCUTANEOUS
  Administered 2022-08-25: 20 [IU] via SUBCUTANEOUS
  Administered 2022-08-25 (×2): 4 [IU] via SUBCUTANEOUS
  Administered 2022-08-26: 7 [IU] via SUBCUTANEOUS
  Administered 2022-08-26: 20 [IU] via SUBCUTANEOUS
  Administered 2022-08-26: 4 [IU] via SUBCUTANEOUS
  Administered 2022-08-27: 11 [IU] via SUBCUTANEOUS
  Administered 2022-08-27: 4 [IU] via SUBCUTANEOUS
  Administered 2022-08-27: 7 [IU] via SUBCUTANEOUS
  Administered 2022-08-28: 20 [IU] via SUBCUTANEOUS
  Administered 2022-08-28: 7 [IU] via SUBCUTANEOUS
  Filled 2022-08-11 (×49): qty 1

## 2022-08-11 MED ORDER — MODAFINIL 100 MG PO TABS
100.0000 mg | ORAL_TABLET | Freq: Every day | ORAL | Status: DC
  Administered 2022-08-11 – 2022-08-28 (×17): 100 mg via ORAL
  Filled 2022-08-11 (×18): qty 1

## 2022-08-11 MED ORDER — LACTATED RINGERS IV SOLN: INTRAVENOUS | Status: DC

## 2022-08-11 MED ORDER — ONDANSETRON HCL 4 MG PO TABS
4.0000 mg | ORAL_TABLET | Freq: Four times a day (QID) | ORAL | Status: DC | PRN
  Administered 2022-08-12 – 2022-08-23 (×6): 4 mg via ORAL
  Filled 2022-08-11 (×6): qty 1

## 2022-08-11 MED ORDER — VANCOMYCIN HCL 500 MG/100ML IV SOLN
500.0000 mg | INTRAVENOUS | Status: DC
  Administered 2022-08-11: 500 mg via INTRAVENOUS
  Filled 2022-08-11 (×2): qty 100

## 2022-08-11 MED ORDER — ACETAMINOPHEN 650 MG RE SUPP: 650.0000 mg | Freq: Four times a day (QID) | RECTAL | Status: DC | PRN

## 2022-08-11 MED ORDER — ASPIRIN 81 MG PO TBEC
81.0000 mg | DELAYED_RELEASE_TABLET | Freq: Every day | ORAL | Status: DC
  Administered 2022-08-11 – 2022-08-19 (×9): 81 mg via ORAL
  Filled 2022-08-11 (×10): qty 1

## 2022-08-11 MED ORDER — SODIUM BICARBONATE 8.4 % IV SOLN
INTRAVENOUS | Status: DC
  Filled 2022-08-11: qty 150
  Filled 2022-08-11 (×3): qty 1000

## 2022-08-11 MED ORDER — ENOXAPARIN SODIUM 60 MG/0.6ML IJ SOSY
0.5000 mg/kg | PREFILLED_SYRINGE | INTRAMUSCULAR | Status: DC
  Administered 2022-08-11 – 2022-08-20 (×10): 55 mg via SUBCUTANEOUS
  Filled 2022-08-11 (×10): qty 0.6

## 2022-08-11 MED ORDER — LEVOTHYROXINE SODIUM 25 MCG PO TABS
25.0000 ug | ORAL_TABLET | ORAL | Status: DC
  Administered 2022-08-11 – 2022-08-28 (×13): 25 ug via ORAL
  Filled 2022-08-11 (×13): qty 1

## 2022-08-11 MED ORDER — ONDANSETRON HCL 4 MG/2ML IJ SOLN
4.0000 mg | Freq: Four times a day (QID) | INTRAMUSCULAR | Status: DC | PRN
  Administered 2022-08-11 – 2022-08-24 (×3): 4 mg via INTRAVENOUS
  Filled 2022-08-11 (×4): qty 2

## 2022-08-11 MED ORDER — SODIUM CHLORIDE 0.9 % IV SOLN
2.0000 g | INTRAVENOUS | Status: DC
  Administered 2022-08-11 – 2022-08-16 (×6): 2 g via INTRAVENOUS
  Filled 2022-08-11 (×5): qty 20
  Filled 2022-08-11: qty 2
  Filled 2022-08-11: qty 20

## 2022-08-11 MED ORDER — EZETIMIBE 10 MG PO TABS
10.0000 mg | ORAL_TABLET | Freq: Every day | ORAL | Status: DC
  Administered 2022-08-11 – 2022-08-28 (×17): 10 mg via ORAL
  Filled 2022-08-11 (×18): qty 1

## 2022-08-11 MED ORDER — SERTRALINE HCL 50 MG PO TABS
150.0000 mg | ORAL_TABLET | Freq: Every day | ORAL | Status: DC
  Administered 2022-08-11 – 2022-08-28 (×17): 150 mg via ORAL
  Filled 2022-08-11 (×18): qty 3

## 2022-08-11 MED ORDER — LEVOTHYROXINE SODIUM 25 MCG PO TABS: 25.0000 ug | ORAL_TABLET | Freq: Every day | ORAL | Status: DC

## 2022-08-11 MED ORDER — ROSUVASTATIN CALCIUM 20 MG PO TABS
40.0000 mg | ORAL_TABLET | Freq: Every day | ORAL | Status: DC
  Administered 2022-08-11 – 2022-08-12 (×2): 40 mg via ORAL
  Filled 2022-08-11 (×2): qty 2

## 2022-08-11 MED ORDER — PANTOPRAZOLE SODIUM 40 MG PO TBEC
40.0000 mg | DELAYED_RELEASE_TABLET | Freq: Every day | ORAL | Status: DC
  Administered 2022-08-11 – 2022-08-28 (×17): 40 mg via ORAL
  Filled 2022-08-11 (×18): qty 1

## 2022-08-11 MED ORDER — ICOSAPENT ETHYL 1 G PO CAPS
2.0000 g | ORAL_CAPSULE | Freq: Two times a day (BID) | ORAL | Status: DC
  Administered 2022-08-11 – 2022-08-28 (×32): 2 g via ORAL
  Filled 2022-08-11 (×36): qty 2

## 2022-08-11 MED ORDER — HYDROMORPHONE HCL 1 MG/ML IJ SOLN
1.0000 mg | INTRAMUSCULAR | Status: DC | PRN
  Administered 2022-08-11 – 2022-08-12 (×4): 1 mg via INTRAVENOUS
  Filled 2022-08-11 (×4): qty 1

## 2022-08-11 MED ORDER — TRAZODONE HCL 50 MG PO TABS
100.0000 mg | ORAL_TABLET | Freq: Every evening | ORAL | Status: DC | PRN
  Administered 2022-08-12 – 2022-08-27 (×15): 100 mg via ORAL
  Filled 2022-08-11 (×15): qty 2

## 2022-08-11 MED ORDER — AMLODIPINE BESYLATE 5 MG PO TABS
5.0000 mg | ORAL_TABLET | Freq: Every day | ORAL | Status: DC
  Administered 2022-08-11 – 2022-08-18 (×8): 5 mg via ORAL
  Filled 2022-08-11 (×9): qty 1

## 2022-08-11 NOTE — Consult Note (Addendum)
ORTHOPAEDIC CONSULTATION  REQUESTING PHYSICIAN: Sharen Hones, MD  Chief Complaint:   Left knee pain  History of Present Illness: Summer [name] is a 71 y.o. female who had a fall approximately 3 days ago.  She states that she was having intolerable amount of pain in her knee until 2 days ago when she began to develop much more severe pain in her left knee.  She presents to the ED with an elevated white blood count (18.2) and a fever of 101.1 with tachycardia to 117.  She was started on empiric IV antibiotics and admitted.  Of note, the patient states she does have a history of gout in her big toe.  Radiographs in the emergency department showed a left knee joint effusion and given patient's severe pain, there was concern for possible infection.  Medical history significant for Diabetes with polyneuropathy with a chronic left foot wound previously seen at the wound care clinic, OSA, CKD stage IIIb, depression, HTN, HFpEF, class III obesity, OSA and hypothyroidism.  Past Medical History:  Diagnosis Date   Anemia    Arthritis    Asthma    B12 deficiency    Back pain    Carpal tunnel syndrome    Chronic kidney disease    Constipation    Depression    Depressive disorder    Diabetes mellitus    Dyspnea    Fluid retention    Foot pain    GERD (gastroesophageal reflux disease)    Headache    History of hiatal hernia    Hyperlipidemia    Hypertension    IBS (irritable bowel syndrome)    Insomnia    Joint pain    Lumbago    Memory loss    Obesity    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)    Pneumonia    Rhinitis, allergic    Rosacea    Sleep apnea    SOB (shortness of breath)    Thyroid disease    Unspecified hearing loss    Unspecified hereditary and idiopathic peripheral neuropathy    Unspecified sleep apnea    Vitamin D deficiency    Past Surgical History:  Procedure Laterality Date   ABDOMINAL HYSTERECTOMY  1975    ANKLE SURGERY Left approx Jan 2018   APPENDECTOMY  1970   CATARACT EXTRACTION  01/2011   right   COLONOSCOPY WITH PROPOFOL N/A 08/19/2019   Procedure: COLONOSCOPY WITH PROPOFOL;  Surgeon: Jonathon Bellows, MD;  Location: [facility] ENDOSCOPY;  Service: Gastroenterology;  Laterality: N/A;   eye lid surgery  2013   bilateral   FOOT SURGERY     LOWER EXTREMITY ANGIOGRAPHY Left 11/23/2020   Procedure: LOWER EXTREMITY ANGIOGRAPHY;  Surgeon: Katha Cabal, MD;  Location: [facility];  Service: Cardiovascular;  Laterality: Left;   NECK SURGERY     SPINE SURGERY     TUBAL LIGATION     VAGINAL HYSTERECTOMY  1989   Social History   Socioeconomic History   Marital status: Significant Other    Spouse name: [name] Rump   Number of children: 1   Years of education: Not on file   Highest education level: High school graduate  Occupational History   Occupation: retired  Tobacco Use   Smoking status: Never   Smokeless tobacco: Never  Vaping Use   Vaping Use: Never used  Substance and Sexual Activity   Alcohol use: No    Alcohol/week: 0.0 standard drinks of alcohol   Drug use: No  Sexual activity: Yes    Partners: Male  Other Topics Concern   Not on file  Social History Narrative   Son lives in [place] (retired from [employer]) and her granddaughter [place]1yr old[place]   Social Determinants of Health   Financial Resource Strain: [place](08/30/2021)   Overall Financial Resource Strain ([place]    Difficulty of Paying Living Expenses: Somewhat hard  Food Insecurity: Food Insecurity Present (09/22/2021)   Hunger Vital Sign    Worried About Running Out of Food in the Last Year: Sometimes true    Ran Out of Food in the Last Year: Never true  Transportation Needs: No Transportation Needs (08/30/2021)   PRAPARE - THydrologist(Medical): No    Lack of Transportation (Non-Medical): No  Physical Activity: Inactive (08/30/2021)    Exercise Vital Sign    Days of Exercise per Week: 0 days    Minutes of Exercise per Session: 0 min  Stress: No Stress Concern Present (08/30/2021)   [place]   Feeling of Stress : Not at all  Social Connections: Moderately Isolated (08/30/2021)   Social Connection and Isolation Panel [NHANES]    Frequency of Communication with Friends and Family: More than three times a week    Frequency of Social Gatherings with Friends and Family: Once a week    Attends Religious Services: Never    AMarine scientistor Organizations: No    Attends CMusic therapist Never    Marital Status: Living with partner   Family History  Problem Relation Age of Onset   Aneurysm Mother    Aortic aneurysm Mother    Hypertension Mother    Hyperlipidemia Mother    Heart disease Mother    Obesity Mother    Heart attack Maternal Grandfather    Diabetes Maternal Grandfather    Allergies  Allergen Reactions   Codeine Other (See Comments)    "TRIPPED OUT"  DIDN'T LIKE THE WAY IT FELT   Atorvastatin     muscle pain   Hydrocodone     itching   Tramadol    Latex Rash   Zolpidem Other (See Comments)    Sleep walk   Prior to Admission medications   Medication Sig Start Date End Date Taking? Authorizing Provider  ACCU-CHEK SOFTCLIX LANCETS lancets  08/26/17  Yes [provider]  Alcohol Swabs (B-D SINGLE USE SWABS REGULAR) PADS  01/04/18  Yes [provider]  amLODipine (NORVASC) 5 MG tablet Take 1 tablet (5 mg total) by mouth daily. 07/04/22  Yes Sowles, KDrue Stager MD  ASPIRIN LOW DOSE 81 MG EC tablet TAKE 1 TABLET (81 MG TOTAL) BY MOUTH DAILY. 05/31/16  Yes Sowles, KDrue Stager MD  Blood Glucose Monitoring Suppl (ACCU-CHEK AVIVA PLUS) w/Device KIT  11/02/15  Yes [provider]  Budeson-Glycopyrrol-Formoterol (BREZTRI AEROSPHERE) 160-9-4.8 MCG/ACT AERO Inhale 2 puffs into the lungs in the morning and at  bedtime. 10/19/21  Yes Kasa, KMaretta Bees MD  buPROPion (WELLBUTRIN XL) 150 MG 24 hr tablet Take 1 tablet (150 mg total) by mouth daily. 07/04/22  Yes Sowles, KDrue Stager MD  Cholecalciferol (VITAMIN D3) 25 MCG (1000 UT) CAPS Take 1,000 Units by mouth.   Yes [provider]  Continuous Blood Gluc Sensor (FREESTYLE LIBRE 2 SENSOR) MISC FOR GLUCOSE MONITORING 06/27/22  Yes [provider]  Cyanocobalamin (VITAMIN B12 PO) Take 1 tablet by mouth daily.  Yes [provider]  ezetimibe (ZETIA) 10 MG tablet Take 1 tablet (10 mg total) by mouth daily. 05/22/22  Yes Sowles, Drue Stager, MD  furosemide (LASIX) 20 MG tablet Take 1 tablet (20 mg total) by mouth daily. 07/04/22  Yes Sowles, Drue Stager, MD  insulin NPH-regular Human (70-30) 100 UNIT/ML injection Inject 100 Units into the skin daily with supper.   Yes Lonia Farber, MD  irbesartan-hydrochlorothiazide (AVALIDE) 300-12.5 MG tablet Take 1 tablet by mouth daily. 04/14/22  Yes Sowles, Drue Stager, MD  ketoconazole (NIZORAL) 2 % cream Apply 1 Application topically daily. 07/04/22  Yes Sowles, Drue Stager, MD  levothyroxine (SYNTHROID) 25 MCG tablet Take 1 tablet (25 mcg total) by mouth daily before breakfast. Two on Sunday 07/04/22  Yes Sowles, Drue Stager, MD  metFORMIN (GLUCOPHAGE) 500 MG tablet Take 1 tablet by mouth 2 (two) times daily with a meal.   Yes Lonia Farber, MD  metoprolol tartrate (LOPRESSOR) 25 MG tablet Take 1 tablet (25 mg total) by mouth 2 (two) times daily. 07/04/22  Yes Sowles, Drue Stager, MD  Misc Natural Products (OSTEO BI-FLEX ADV JOINT SHIELD PO) Take by mouth 2 (two) times daily.    Yes [provider]  modafinil (PROVIGIL) 100 MG tablet Take 1 tablet (100 mg total) by mouth daily. 07/04/22  Yes Sowles, Drue Stager, MD  montelukast (SINGULAIR) 10 MG tablet Take 10 mg by mouth daily. 08/10/22  Yes [provider]  omeprazole (PRILOSEC) 40 MG capsule Take 1 capsule (40 mg total) by mouth daily. 07/04/22  Yes  Sowles, Drue Stager, MD  Potassium 99 MG TABS Take by mouth daily at 6 (six) AM.   Yes [provider]  pregabalin (LYRICA) 300 MG capsule Take 300 mg by mouth 2 (two) times a day. 03/06/19  Yes Lonia Farber, MD  Probiotic Product (PROBIOTIC-10 PO) Take by mouth.   Yes [provider]  [place] 1ML/31G 31G X 5/16" 1 ML [place]  12/19/15  Yes [provider]  rosuvastatin (CRESTOR) 40 MG tablet Take 1 tablet (40 mg total) by mouth daily. 04/14/22  Yes Sowles, Drue Stager, MD  sertraline (ZOLOFT) 100 MG tablet Take 1.5 tablets (150 mg total) by mouth daily. 07/04/22  Yes Sowles, Drue Stager, MD  VASCEPA 1 g capsule Take 2 capsules (2 g total) by mouth 2 (two) times daily. 04/04/22  Yes Sowles, Drue Stager, MD  acetaminophen (TYLENOL 8 HOUR) 650 MG CR tablet Take 1 tablet (650 mg total) by mouth every 8 (eight) hours as needed for pain. 10/16/18   Steele Sizer, MD  albuterol (VENTOLIN HFA) 108 (90 Base) MCG/ACT inhaler Inhale 2 puffs into the lungs every 6 (six) hours as needed for wheezing or shortness of breath. 01/05/22   Flora Lipps, MD  fluconazole (DIFLUCAN) 150 MG tablet Take 1 tablet (150 mg total) by mouth every other day. Patient not taking: Reported on 08/10/2022 07/04/22   Steele Sizer, MD  traZODone (DESYREL) 100 MG tablet Take 1 tablet (100 mg total) by mouth at bedtime as needed for sleep. 07/04/22   Steele Sizer, MD   Recent Labs    08/10/22 1830 08/11/22 0114  WBC 18.2* 17.5*  HGB 11.7* 10.2*  HCT 35.4* 30.5*  PLT 182 179  K 4.3 4.1  CL 105 108  CO2 21* 16*  BUN 35* 33*  CREATININE 1.89* 1.90*  1.86*  GLUCOSE 214* 220*  CALCIUM 10.0 9.1  INR  --  1.4*   DG Chest 2 View  Result Date: 08/11/2022 CLINICAL DATA:  Aspiration pneumonia EXAM: CHEST - 2 VIEW COMPARISON:  10/19/21 CXR FINDINGS: Pleural effusion. No pneumothorax. There may be patchy airspace opacities at the lung bases there are only seen on the lateral view. Normal cardiac and  mediastinal contours. No displaced rib fractures. Visualized upper abdomen is unremarkable. Degenerative changes of the left glenohumeral and AC joint. IMPRESSION: Possible patchy airspace opacities at the lung bases that are only seen on the lateral view, which could represent atelectasis, aspiration or infection. Electronically Signed   By: Marin Roberts M.D.   On: 08/11/2022 08:44   DG Foot 2 Views Left  Result Date: 08/10/2022 CLINICAL DATA:  Left foot pain status post fall EXAM: LEFT FOOT - 2 VIEW COMPARISON:  Radiographs 10/08/2020 FINDINGS: Extensive postoperative changes about the talus and calcaneus. Fusion of the subtalar joint. No acute fracture or dislocation. Transmetatarsal amputation of the fifth metatarsal. Pes planus. Soft tissue swelling diffusely about the foot. Ulcer about the plantar surface of the great toe. IMPRESSION: No acute osseous abnormality. Extensive postoperative changes about the talus and calcaneus. Diffuse soft tissue swelling. Great toe ulcer. Electronically Signed   By: Placido Sou M.D.   On: 08/10/2022 23:08   DG Hip Unilat With Pelvis 2-3 Views Left  Result Date: 08/10/2022 CLINICAL DATA:  Status post fall. EXAM: DG HIP (WITH OR WITHOUT PELVIS) 2-3V LEFT COMPARISON:  None Available. FINDINGS: There is no evidence of hip fracture or dislocation. Mild to moderate severity degenerative changes are seen in the form of joint space narrowing and acetabular sclerosis. IMPRESSION: Mild to moderate severity degenerative changes without evidence of acute osseous abnormality. Electronically Signed   By: Virgina Norfolk M.D.   On: 08/10/2022 19:57   DG Knee Complete 4 Views Right  Result Date: 08/10/2022 CLINICAL DATA:  Status post fall. EXAM: RIGHT KNEE - COMPLETE 4+ VIEW COMPARISON:  None Available. FINDINGS: No evidence of an acute fracture or dislocation. Medial and lateral marginal osteophyte formation is noted. There is marked severity patellofemoral joint space  narrowing. Moderate to marked severity medial and lateral tibiofemoral compartment space narrowing is also seen. There is a small joint effusion. IMPRESSION: 1. 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 2. Small joint effusion. Electronically Signed   By: Virgina Norfolk M.D.   On: 08/10/2022 19:56   DG Knee Complete 4 Views Left  Result Date: 08/10/2022 CLINICAL DATA:  Status post fall. EXAM: LEFT KNEE - COMPLETE 4+ VIEW COMPARISON:  None Available. FINDINGS: No evidence of an acute fracture or dislocation. Medial and lateral marginal osteophyte formation is seen. There is moderate severity patellofemoral joint space narrowing. Moderate severity medial and lateral tibiofemoral compartment space narrowing is also seen. A small to moderate sized joint effusion is noted. IMPRESSION: 1. Moderate severity tricompartmental osteoarthritis. 2. Small to moderate sized joint effusion. Electronically Signed   By: Virgina Norfolk M.D.   On: 08/10/2022 19:55     Positive ROS: All other systems have been reviewed and were otherwise negative with the exception of those mentioned in the HPI and as above.  Physical Exam: BP (!) 89/60 (BP Location: Left Arm)   Pulse 75   Temp 97.7 F (36.5 C) (Oral)   Resp 18   Ht _0  (1.575 m)   Wt 109.3 kg   SpO2 98%   BMI 44.08 kg/m  General:  Alert, no acute distress Psychiatric:  Patient is competent for consent with normal mood and affect   Cardiovascular:  No pedal edema, regular rate and  rhythm Respiratory:  No wheezing, non-labored breathing  Orthopedic Exam:  LLE: 5/5 DF/PF/EHL SILT s/s/t/sp/dp distr Foot wwp RoM knee: Severe pain with any attempted range of motion.  Significant tenderness diffusely about the knee.  Mild warmth.  1+ joint effusion.  No significant erythema.  Knee resting in approximately 30 degrees flexion and with hip and external rotation   X-rays:  As above: Left knee radiographs show  tricompartmental degenerative changes to the left knee.  Joint effusion is present.  Assessment/Plan: 71 year old female with acute onset severe left knee pain in the setting of admission for sepsis with unknown etiology. 1.  We discussed the patient's symptoms and agreed to proceed with a left knee joint aspiration.  Please see procedure note below for further details.  In brief, approximately 15 cc of cloudy, straw-colored fluid was aspirated from the knee joint.  This was sent to the lab for synovial fluid analysis, Gram stain, and culture.  Results of synovial fluid analysis are consistent with gout (38.1 WBC, 93% PMN's, and positive monosodium urate crystals).  2.  We discussed the various treatment options.  Patient cannot take NSAIDs due to kidney disease.  We discussed possibility of joint injection with corticosteroid, the patient would prefer to avoid any further invasive procedures to the knee if possible.  Therefore we discussed that an option may be oral steroids.  These findings were communicated to the patient's primary team.  3.  Patient may have a diet.  No plan for operative intervention.   Procedure Note - Left Knee Aspiration I reviewed with the patient the procedure of L knee joint aspiration and discussed the risks, benefits, and alternative treatments. We discussed the potential risks of infection, increased pain, and incomplete relief or temporary relief of symptoms. Verbal consent was obtained.  A time-out was conducted to verify correct patient identity, procedure to be performed, and correct side and site. The skin was marked and then prepped in usual sterile fashion. Superomedial approach was used (due to patient comfort positioning), and I obtained ~15cc of cloudy yellow synovial fluid. The patient tolerated the procedure adequately. Aspiration sent for cell count, culture/gram stain, and crystals.     Leim Fabry   08/11/2022 12:58 PM

## 2022-08-11 NOTE — Assessment & Plan Note (Addendum)
Patient with severe 10/10 pain left knee Left knee xray IMPRESSION: 1. Moderate severity tricompartmental osteoarthritis. 2. Small to moderate sized joint effusion  Pain control Ortho consulted for aspiration PT eval

## 2022-08-11 NOTE — Progress Notes (Signed)
Pharmacy Antibiotic Note  Summer [name] is a 71 y.o. female admitted on 08/10/2022 with cellulitis.  Pharmacy has been consulted for Vancomycin dosing for 7 days.  Plan: Pt given Vancomycin 2000 mg once. Vancomycin 500 mg IV Q 24 hrs. Goal AUC 400-550. Expected AUC: 414.3 SCr used: 1.86, Vd used: 0.5, BMI: 43.98  Pharmacy will continue to follow and will adjust abx dosing whenever warranted.  Temp (24hrs), Avg:99.8 F (37.7 C), Min:98.1 F (36.7 C), Max:101.1 F (38.4 C)   Recent Labs  Lab 08/10/22 1830 08/11/22 0114  WBC 18.2* 17.5*  CREATININE 1.89* 1.86*  LATICACIDVEN  --  0.8    Estimated Creatinine Clearance: 31.4 mL/min (A) (by C-G formula based on SCr of 1.86 mg/dL (H)).    Allergies  Allergen Reactions   Codeine Other (See Comments)    "TRIPPED OUT"  DIDN'T LIKE THE WAY IT FELT   Atorvastatin     muscle pain   Hydrocodone     itching   Tramadol    Latex Rash   Zolpidem Other (See Comments)    Sleep walk    Antimicrobials this admission: 12/01 Ceftriaxone >> x 7 days 12/01 Vancomycin >> x 7 days  Microbiology results: 11/30 BCx: Pending  Thank you for allowing pharmacy to be a part of this patient's care.  Renda Rolls, PharmD, [facility] 08/11/2022 1:58 AM

## 2022-08-11 NOTE — Progress Notes (Signed)
PHARMACY - PHYSICIAN COMMUNICATION CRITICAL VALUE ALERT - BLOOD CULTURE IDENTIFICATION (BCID)  Summer [name] is an 71 y.o. female who presented to [facility] on 08/10/2022 with a chief complaint of knee pain and fall.    Assessment:  Blood culture from 11/30 with GPC in 3 of 4 bottles, BCID detected S pneumoniae.  On antibiotics for possible knee infection (rule out with synovial fluid eval - likely gout) and possible toe ulcer (unlike to be due to pneumococcus)  Name of physician (or Provider) Contacted: Dr Roosevelt Locks  Current antibiotics: vancomycin and ceftriaxone  Changes to prescribed antibiotics recommended:  Patient is on recommended antibiotics - No changes needed Asked Dr Roosevelt Locks if can stop vancomycin - he has not seen patient yet and will evaluate ability to narrow/target antibiotics  Results for orders placed or performed during the hospital encounter of 08/10/22  Blood Culture ID Panel (Reflexed) (Collected: 08/10/2022 11:50 PM)  Result Value Ref Range   Enterococcus faecalis NOT DETECTED NOT DETECTED   Enterococcus Faecium NOT DETECTED NOT DETECTED   Listeria monocytogenes NOT DETECTED NOT DETECTED   Staphylococcus species NOT DETECTED NOT DETECTED   Staphylococcus aureus (BCID) NOT DETECTED NOT DETECTED   Staphylococcus epidermidis NOT DETECTED NOT DETECTED   Staphylococcus lugdunensis NOT DETECTED NOT DETECTED   Streptococcus species DETECTED (A) NOT DETECTED   Streptococcus agalactiae NOT DETECTED NOT DETECTED   Streptococcus pneumoniae DETECTED (A) NOT DETECTED   Streptococcus pyogenes NOT DETECTED NOT DETECTED   A.calcoaceticus-baumannii NOT DETECTED NOT DETECTED   Bacteroides fragilis NOT DETECTED NOT DETECTED   Enterobacterales NOT DETECTED NOT DETECTED   Enterobacter cloacae complex NOT DETECTED NOT DETECTED   Escherichia coli NOT DETECTED NOT DETECTED   Klebsiella aerogenes NOT DETECTED NOT DETECTED   Klebsiella oxytoca NOT DETECTED NOT DETECTED   Klebsiella  pneumoniae NOT DETECTED NOT DETECTED   Proteus species NOT DETECTED NOT DETECTED   Salmonella species NOT DETECTED NOT DETECTED   Serratia marcescens NOT DETECTED NOT DETECTED   Haemophilus influenzae NOT DETECTED NOT DETECTED   Neisseria meningitidis NOT DETECTED NOT DETECTED   Pseudomonas aeruginosa NOT DETECTED NOT DETECTED   Stenotrophomonas maltophilia NOT DETECTED NOT DETECTED   Candida albicans NOT DETECTED NOT DETECTED   Candida auris NOT DETECTED NOT DETECTED   Candida glabrata NOT DETECTED NOT DETECTED   Candida krusei NOT DETECTED NOT DETECTED   Candida parapsilosis NOT DETECTED NOT DETECTED   Candida tropicalis NOT DETECTED NOT DETECTED   Cryptococcus neoformans/gattii NOT DETECTED NOT DETECTED    Doreene Eland, PharmD, BCPS, BCIDP Work Cell: [phone] 08/11/2022 2:33 PM

## 2022-08-11 NOTE — Hospital Course (Addendum)
Summer [name] is a 71 y.o. female with medical history significant for Diabetes with polyneuropathy with a chronic left foot wound previously seen at the wound care clinic, OSA, CKD stage IIIb, depression, HTN, HFpEF, class III obesity, OSA and hypothyroidism,Who presents to the ED via EMS with frequent falls over the past 3 days, now with severe left knee pain.  Upon arriving the hospital, temperature was 101.1, HR 117, creatinine 1.89.  CK level 6435.  UA abnormal, x-ray showed mild to moderate left knee joint effusion. Patient was started on broad spectrum antibiotics for possible sepsis.  Seen by orthopedics, knee tap results support diagnosis of gout.    12/1: Podiatry recommends conservative management with dressing changes and weightbearing as tolerated with surgical shoe.  Left knee aspiration by Ortho the fluid suggestive of gout.  Started on steroids 12/2: PT, OT eval recommends SNF 12/3: Waiting for SNF placement, TOC aware, CK improving.  ID consult for strep pneumo bacteremia 12/4: Not motivated to work with therapy.  Palliative care consult, 2D echo and 2 sets of blood culture per ID, starting allopurinol for gout 12/5: Ortho reeval, TEE, stopped steroids 12/6 TEE negative for endocarditis 12/10 Neurology consulted for severe HA, failed bedside LP, broadened to meningitic coverage w/ ceftriaxone, decadron, acyclovir; MRI w/ c/f PRES, Wellbutrin stopped, keppra 3000 mg IV loaded 12/11 Failed IR LP due to agitation, EEG w/ intermittent diffuse slowing and focal right slowing, Keppra 500 mg BID considered but not ordered, mental status rapidly improving in the afternoon  12/12 Failed second IR LP attempt, scant CSF flash in needle, felt to be secondary to diffuse epidural lipomatosis narrowing her lumbar thecal sac severely; acyclovir stopped per ID & Neuro 12/13: Mental status improving 12/14: Seizure likely secondary to PRES (confirmed on MRI) per neurology 12/15: Mental status at  baseline, per ID switch to amoxicillin on 08/27/2022 for 2 more weeks for treatment of possible left septic arthritis

## 2022-08-11 NOTE — ED Notes (Signed)
Pillows placed under R side of patient to alleviate pressure from back and buttocks. New purewick placed, pt cleaned and dry. Pt denies any needs at this time, call light is in reach. WCTM.

## 2022-08-11 NOTE — ED Notes (Addendum)
Pt repositioned in bed at this time, pillows removed from underneath R side and placed underneath L side to alleviate pressure from back and buttocks. Pt denies any needs at this time, WCTM. Call light in reach.

## 2022-08-11 NOTE — ED Notes (Signed)
Pt oxygen saturation 85% s/p pain medication admin, see MAR. Pt placed on 2L [place] with marked improvement, tolerating well. WCTM.

## 2022-08-11 NOTE — Progress Notes (Signed)
PHARMACIST - PHYSICIAN COMMUNICATION  CONCERNING:  Enoxaparin (Lovenox) for DVT Prophylaxis    RECOMMENDATION: Patient was prescribed enoxaprin '40mg'$  q24 hours for VTE prophylaxis.   Filed Weights   08/10/22 1801  Weight: 109.3 kg (241 lb)    Body mass index is 44.08 kg/m.  Estimated Creatinine Clearance: 30.9 mL/min (A) (by C-G formula based on SCr of 1.89 mg/dL (H)).   Based on [place] patient is candidate for enoxaparin 0.'5mg'$ /kg TBW SQ every 24 hours based on BMI being >30.  DESCRIPTION: Pharmacy has adjusted enoxaparin dose per [facility] policy.  Patient is now receiving enoxaparin 0.5 mg/kg every 24 hours   Renda Rolls, PharmD, [facility] 08/11/2022 12:54 AM

## 2022-08-11 NOTE — ED Notes (Signed)
Informed RN bed assigned 

## 2022-08-11 NOTE — Consult Note (Signed)
  Subjective:  Patient ID: Summer [name], female    DOB: 1951/01/29,  MRN: [id number]  A 71 y.o. female  medical history significant for Diabetes with polyneuropathy with a chronic left foot wound previously seen at the wound care clinic, OSA, CKD stage IIIb, depression, HTN, HFpEF, class III obesity, OSA and hypothyroidism,Who presents to the ED left great toe ulceration that has been present for quite some time.  Patient states that she has been getting it treated with local wound care for past 5 years.  She denies any other acute complaints.  She has been known to Dr. Amalia Hailey. Objective:   Vitals:   08/11/22 1430 08/11/22 1630  BP: (!) 124/51 (!) 143/52  Pulse: 73 73  Resp: 19 18  Temp:  97.9 F (36.6 C)  SpO2:  96%   General AA&O x3. Normal mood and affect.  Vascular Dorsalis pedis and posterior tibial pulses 2/4 bilat. Brisk capillary refill to all digits. Pedal hair present.  Neurologic Epicritic sensation grossly intact.  Dermatologic Left great toe ulceration does not probe down to bone with fat layer exposed fibrogranular wound base no erythema purulent drainage noted.  No abnormalities identified.  Orthopedic: MMT 5/5 in dorsiflexion, plantarflexion, inversion, and eversion. Normal joint ROM without pain or crepitus.   IMPRESSION: No acute osseous abnormality.   Extensive postoperative changes about the talus and calcaneus.   Diffuse soft tissue swelling.   Great toe ulcer. Assessment & Plan:  Patient was evaluated and treated and all questions answered.  Left foot great toe ulceration without underlying osteomyelitis -All questions and concerns were discussed with the patient in extensive detail -I discussed with the patient at this time we will consider managed with a local wound care.  This is likely due to the hallux malleus contracture leading to excessive stress to the distal tip of the hallux and therefore the ulceration. -At this time local wound care with Betadine  wet-to-dry dressing -Weightbearing as tolerated with surgical shoe -No acute surgical plans are indicated at this time. -Podiatry to sign off.  She can see Dr. Amalia Hailey 1 week from discharge.  Felipa Furnace, DPM  Accessible via secure chat for questions or concerns.

## 2022-08-11 NOTE — Progress Notes (Signed)
Progress Note   Patient: Summer [name] OVF:[id number] DOB: 04/20/1951 DOA: 08/10/2022     1 DOS: the patient was seen and examined on 08/11/2022   Brief hospital course:  Summer [name] is a 71 y.o. female with medical history significant for Diabetes with polyneuropathy with a chronic left foot wound previously seen at the wound care clinic, OSA, CKD stage IIIb, depression, HTN, HFpEF, class III obesity, OSA and hypothyroidism,Who presents to the ED via EMS with frequent falls over the past 3 days, now with severe left knee pain.  Upon arriving the hospital, temperature was 101.1, HR 117, creatinine 1.89.  CK level 6435.  UA abnormal, x-ray showed mild to moderate left knee joint effusion. Patient was started on broad spectrum antibiotics for possible sepsis.  Seen by orthopedics, knee tap results support diagnosis of gout.  Started steroids. Podiatry consult obtained for left foot ulcer.  Assessment and Plan:  * Sepsis ([place]) due to urinary tract infection Urinary tract infection. Patient had a high fever, tachycardia and tachypnea, leukocytosis.  Abnormal urine.  Most likely this is secondary to UTI. Rocephin started, pending culture results. Patient also put on vancomycin in case patient has diabetic foot infection. Patient also had left lower lobe infiltrate/atelectasis, will obtain speech therapy evaluation to rule out aspiration.  Traumatic rhabdomyolysis ([name]) Chronic kidney disease stage IIIa. Acute renal failure ruled out. Metabolic acidosis. I will continue IV fluids, changed to bicarb drip.  Patient does not meet criteria for acute renal failure.  Frequent falls PT/OT eval.  Patient did not have syncope episode.  Acute pain of left knee Seen by orthopedic surgery, joint aspiration is performed, consistent with gout.  Steroids started.  Ulcer of left foot due to type 2 diabetes mellitus ([place]) Uncontrolled type 2 diabetes mellitus with hyperglycemia, with long-term current use  of insulin ([place]) Consult from podiatry is obtained, I will continue coverage with vancomycin for now in case there is a significant infection. Continue insulin glargine and sliding scale insulin.  Major depressive disorder, recurrent episode, moderate ([name]) Continue bupropion, sertraline and trazodone  (HFpEF) heart failure with preserved ejection fraction ([place]) Does not have any volume overload.  Morbid obesity with BMI of 40.0-44.9, adult ([place]) Complicating factor to overall prognosis and care  Adult hypothyroidism Continue levothyroxine  Obstructive sleep apnea Continue CPAP      Subjective:  Still complaint significant left knee pain.  Has some confusion.  Physical Exam: Vitals:   08/11/22 1022 08/11/22 1250 08/11/22 1300 08/11/22 1430  BP: (!) 143/60 (!) 89/60 128/61 (!) 124/51  Pulse: 79 75 72 73  Resp: 20 18 (!) 23 19  Temp:  97.7 F (36.5 C)    TempSrc:  Oral    SpO2: 94% 98%    Weight:      Height:       General exam: Appears calm and comfortable, morbid obese Respiratory system: Clear to auscultation. Respiratory effort normal. Cardiovascular system: S1 & S2 heard, RRR. No JVD, murmurs, rubs, gallops or clicks. No pedal edema. Gastrointestinal system: Abdomen is nondistended, soft and nontender. No organomegaly or masses felt. Normal bowel sounds heard. Central nervous system: Alert and oriented x2. No focal neurological deficits. Extremities: Symmetric 5 x 5 power. Skin: No rashes, lesions or ulcers Psychiatry:  Mood & affect appropriate.   Data Reviewed:  Reviewed x-rays of the hip, knees.  Reviewed all lab results.  Family Communication: None  Disposition: Status is: Inpatient Remains inpatient appropriate because: Severity of disease, IV treatment.  Planned Discharge Destination: Skilled nursing facility    Time spent: 50 minutes  Author: Sharen Hones, MD 08/11/2022 4:17 PM  For on call review [email or web address].

## 2022-08-11 NOTE — ED Notes (Signed)
Pt monitor alarming, this RN to bedside. Pt has removed gown, nasal cannula, purewick, pillows, IV, and all monitoring. When this RN asked pt why she did this pt stated "I wanted to keep you on your toes". This RN informing pt that this is not safe and is unacceptable behavior. Pt endorsing she will not do this again. Pt positioned in bed for comfort, monitoring replaced. Bilateral IV's placed by this RN and secured with tape. IV fluids restarted. Purewick replaced. New gown on pt and fresh warm blankets placed. Door open, call light in reach, WCTM. VSS, NAD at this time. Bed is locked and in lowest position.

## 2022-08-12 DIAGNOSIS — R7881 Bacteremia: Secondary | ICD-10-CM | POA: Diagnosis present

## 2022-08-12 DIAGNOSIS — B953 Streptococcus pneumoniae as the cause of diseases classified elsewhere: Secondary | ICD-10-CM | POA: Diagnosis not present

## 2022-08-12 DIAGNOSIS — A419 Sepsis, unspecified organism: Secondary | ICD-10-CM | POA: Diagnosis present

## 2022-08-12 LAB — CK: Total CK: 3846 U/L — ABNORMAL HIGH (ref 38–234)

## 2022-08-12 LAB — CBG MONITORING, ED
Glucose-Capillary: 270 mg/dL — ABNORMAL HIGH (ref 70–99)
Glucose-Capillary: 197 mg/dL — ABNORMAL HIGH (ref 70–99)

## 2022-08-12 LAB — MAGNESIUM: Magnesium: 1.7 mg/dL (ref 1.7–2.4)

## 2022-08-12 LAB — BASIC METABOLIC PANEL
Anion gap: 11 (ref 5–15)
BUN: 35 mg/dL — ABNORMAL HIGH (ref 8–23)
CO2: 25 mmol/L (ref 22–32)
Calcium: 9 mg/dL (ref 8.9–10.3)
Chloride: 102 mmol/L (ref 98–111)
Creatinine, Ser: 1.64 mg/dL — ABNORMAL HIGH (ref 0.44–1.00)
GFR, Estimated: 33 mL/min — ABNORMAL LOW (ref >60)
Glucose, Bld: 274 mg/dL — ABNORMAL HIGH (ref 70–99)
Potassium: 3.9 mmol/L (ref 3.5–5.1)
Sodium: 138 mmol/L (ref 135–145)

## 2022-08-12 LAB — CBC
HCT: 30.1 % — ABNORMAL LOW (ref 36.0–46.0)
Hemoglobin: 9.6 g/dL — ABNORMAL LOW (ref 12.0–15.0)
MCH: 27.7 pg (ref 26.0–34.0)
MCHC: 31.9 g/dL (ref 30.0–36.0)
MCV: 87 fL (ref 80.0–100.0)
Platelets: 189 10*3/uL (ref 150–400)
RBC: 3.46 MIL/uL — ABNORMAL LOW (ref 3.87–5.11)
RDW: 15.5 % (ref 11.5–15.5)
WBC: 17.2 10*3/uL — ABNORMAL HIGH (ref 4.0–10.5)
nRBC: 0 % (ref 0.0–0.2)

## 2022-08-12 LAB — GLUCOSE, CAPILLARY
Glucose-Capillary: 187 mg/dL — ABNORMAL HIGH (ref 70–99)
Glucose-Capillary: 193 mg/dL — ABNORMAL HIGH (ref 70–99)

## 2022-08-12 MED ORDER — SODIUM CHLORIDE 0.9 % IV SOLN: INTRAVENOUS | Status: AC

## 2022-08-12 NOTE — Progress Notes (Addendum)
PROGRESS NOTE    Summer [name]  PTW:[id number] DOB: 1951-06-19 DOA: 08/10/2022 PCP: Steele Sizer, MD  115A/115A-AA  LOS: 2 days   Brief hospital course: Summer [name] is a 71 y.o. female with medical history significant for Diabetes with polyneuropathy with a chronic left foot wound previously seen at the wound care clinic, OSA, CKD stage IIIb, depression, HTN, HFpEF, class III obesity, OSA and hypothyroidism, who presented to the ED via EMS with frequent falls over the past 3 days, with severe left knee pain.  Upon arriving the hospital, temperature was 101.1, HR 117, creatinine 1.89.  CK level 6435.  UA abnormal, x-ray showed mild to moderate left knee joint effusion. Patient was started on broad spectrum antibiotics for possible sepsis.  Seen by orthopedics, knee tap results support diagnosis of gout.  Started steroids. Podiatry consult obtained for left foot ulcer.  Assessment & Plan: Sepsis Summer [name])  Patient had a high fever, tachycardia and tachypnea, leukocytosis.  Has Strep pneumo bacteremia.  Strep pneumo bacteremia  --blood cx 2 of 2 pos.    --cont ceftriaxone 2g daily --d/c IV vanc --consult ID on Monday  Acute pain of left knee 2/2 gout Seen by orthopedic surgery, joint aspiration is performed, pos for MONOSODIUM URATE CRYSTALS, consistent with gout.  Steroids started. --cont prednisone 40 mg daily   Left foot diabetic great toe ulceration, chronic without underlying osteomyelitis  --podiatry consulted, no acute surgical plans are indicated at this time. --local wound care with Betadine wet-to-dry dressing -Weightbearing as tolerated with surgical shoe -f/u with Dr. Amalia Hailey 1 week from discharge.  Uncontrolled type 2 diabetes mellitus with hyperglycemia, with long-term current use of insulin ([name]) --cont glargine 10u nightly --ACHS and SSI   Traumatic rhabdomyolysis ([place]) CK elevated at 6435 on presentation.  Following the fall, patient laid on the floor for up to an  hour as it was difficult to get her up.   Already trended down to 3000's with IVF. --cont NS'@50'$  for 20 hours  Chronic kidney disease stage IIIa. Acute renal failure ruled out.  Metabolic acidosis. --bicarb normalized after bicarb infusion.   Frequent falls Patient did not have syncope episode. PT/OT eval.    Major depressive disorder, recurrent episode, moderate ([name]) Continue bupropion, sertraline and trazodone   (HFpEF) heart failure with preserved ejection fraction ([place]) Does not have any volume overload.   Morbid obesity with BMI of 40.0-44.9, adult ([name]) Complicating factor to overall prognosis and care   Adult hypothyroidism Continue levothyroxine   Obstructive sleep apnea Continue CPAP    DVT prophylaxis: Lovenox SQ Code Status: DNR  Family Communication:  Level of care: Med-Surg Dispo:   The patient is from: home Anticipated d/c is to: undetermined Anticipated d/c date is: undetermined Patient currently is not medically ready to d/c due to: strep bacteremia on IV ceftriaxone   Subjective and Interval History:  Pt had a lot of left knee pain and couldn't stand with PT.  Reported dyspnea that's chronic.   Objective: Vitals:   08/12/22 0113 08/12/22 0445 08/12/22 0920 08/12/22 1249  BP: 113/66 (!) 125/90 (!) 156/67 (!) 152/71  Pulse: 71 76 86 76  Resp: '18 20 18 17  '$ Temp: 97.8 F (36.6 C) 98.1 F (36.7 C) 100.2 F (37.9 C)   TempSrc: Oral Oral Oral   SpO2: 95% 97% 96%   Weight:      Height:        Intake/Output Summary (Last 24 hours) at 08/12/2022 1617 Last data filed at  08/12/2022 1400 Gross per 24 hour  Intake 100 ml  Output 600 ml  Net -500 ml   Filed Weights   08/10/22 1801  Weight: 109.3 kg    Examination:   Constitutional: NAD, AAOx3 HEENT: conjunctivae and lids normal, EOMI CV: No cyanosis.   RESP: normal respiratory effort, on RA Extremities: dried ulcer under left toe and lateral left foot SKIN: warm, dry Neuro: II - XII  grossly intact.   Psych: Normal mood and affect.  Appropriate judgement and reason   Data Reviewed: I have personally reviewed labs and imaging studies  Time spent: 50 minutes  Enzo Bi, MD [facility] If 7PM-7AM, please contact night-coverage 08/12/2022, 4:17 PM

## 2022-08-12 NOTE — Evaluation (Signed)
Occupational Therapy Evaluation Patient Details Name: Summer [name] MRN: [id number] DOB: [birth year] Today's Date: 08/12/2022   History of Present Illness 71 y.o. female with medical history significant for Diabetes with polyneuropathy with a chronic left foot wound previously seen at the wound care clinic, OSA, CKD stage IIIb, depression, HTN, HFpEF, class III obesity, OSA and hypothyroidism,Who presents to the ED via EMS with frequent falls over the past 3 days, now with severe left knee pain.  L knee tap reveals gout.   Clinical Impression   Chart reviewed, pt greeted in bed requiring encouragement for participation in OT evaluation. Pt is oriented to self and place, not oriented to situation or to date. States she has been in the ED for days but endorses she has been at hospital since Friday. Poor awareness of deficits. RN present for mobility, IV pain meds provided prior. PTA pt reports she is generally mod I with ADL, boyfriend assists with IADL. Will need to confirm level of functioning. Pt presents with deficits in strength, endurance,activity tolerance, balance, cognition all affecting safe and optimal ADL completion. MAX-TOTAL A +2 required for supine<>Sit, MAX A for UB/LB dressing, MIN A For grooming tasks. Anticipate participation may improve with continued pain management improvement of cognitive status. Pt is left as received, all needs met. Recommend STR to address functional deficits at this time. OT will continue to follow acutely.    Recommendations for follow up therapy are one component of a multi-disciplinary discharge planning process, led by the attending physician.  Recommendations may be updated based on patient status, additional functional criteria and insurance authorization.   Follow Up Recommendations  Skilled nursing-short term rehab (<3 hours/day)     Assistance Recommended at Discharge Frequent or constant Supervision/Assistance  Patient can return home with the  following A lot of help with walking and/or transfers;A lot of help with bathing/dressing/bathroom    Functional Status Assessment  Patient has had a recent decline in their functional status and demonstrates the ability to make significant improvements in function in a reasonable and predictable amount of time.  Equipment Recommendations  Other (comment) (per next venue of care)    Recommendations for Other Services       Precautions / Restrictions Precautions Precautions: Fall Restrictions Weight Bearing Restrictions: Yes LLE Weight Bearing: Weight bearing as tolerated Other Position/Activity Restrictions: L post op shoe      Mobility Bed Mobility Overal bed mobility: Needs Assistance Bed Mobility: Supine to Sit, Sit to Supine     Supine to sit: Max assist, Total assist, +2 for physical assistance Sit to supine: Max assist, Total assist, +2 for safety/equipment, +2 for physical assistance   General bed mobility comments: able to move RLE towards edge of bed while in transition, limited by reported pain    Transfers                   General transfer comment: deferred due to pt pain      Balance Overall balance assessment: Needs assistance Sitting-balance support: Feet supported Sitting balance-Leahy Scale: Fair Sitting balance - Comments: left lateral lean while seated on edge of bed Postural control: Left lateral lean                                 ADL either performed or assessed with clinical judgement   ADL Overall ADL's : Needs assistance/impaired  General ADL Comments: MAX A for LB dressing, MIN A for grooming, MAX A for UB dressing; pt limited tolerance for any activity on this date     Vision Patient Visual Report: No change from baseline       Perception     Praxis      Pertinent Vitals/Pain Pain Assessment Pain Assessment: 0-10 Pain Score: 10-Worst pain ever Pain  Location: L knee and ankle Pain Descriptors / Indicators: Discomfort, Grimacing, Guarding, Aching Pain Intervention(s): Limited activity within patient's tolerance, Monitored during session, Premedicated before session, Repositioned (RN gave IV dilauded prior to mobility)     Hand Dominance     Extremity/Trunk Assessment Upper Extremity Assessment Upper Extremity Assessment: Generalized weakness   Lower Extremity Assessment Lower Extremity Assessment: Defer to PT evaluation;Generalized weakness       Communication Communication Communication: No difficulties   Cognition Arousal/Alertness: Awake/alert Behavior During Therapy: Anxious, Impulsive Overall Cognitive Status: No family/caregiver present to determine baseline cognitive functioning Area of Impairment: Attention, Following commands, Safety/judgement, Awareness, Problem solving                   Current Attention Level: Sustained   Following Commands: Follows one step commands with increased time Safety/Judgement: Decreased awareness of safety, Decreased awareness of deficits Awareness: Emergent Problem Solving: Difficulty sequencing, Requires verbal cues, Requires tactile cues       General Comments       Exercises     Shoulder Instructions      Home Living Family/patient expects to be discharged to:: Private residence Living Arrangements: Spouse/significant other Available Help at Discharge: Available PRN/intermittently Type of Home: House Home Access: Ramped entrance     Home Layout: One level     Bathroom Shower/Tub: Tub/shower unit         Home Equipment: Conservation officer, nature (2 wheels);Rollator (4 wheels);Toilet riser;Wheelchair - manual;Shower seat - built in;Electric scooter;Hand held shower head   Additional Comments: information taken from chart- pt with poor attention to task due to  pain      Prior Functioning/Environment Prior Level of Function : Needs assist                         OT Problem List: Decreased strength;Decreased activity tolerance;Impaired balance (sitting and/or standing);Decreased safety awareness;Decreased cognition;Decreased knowledge of use of DME or AE      OT Treatment/Interventions: Self-care/ADL training;Patient/family education;Therapeutic exercise;Balance training;DME and/or AE instruction;Therapeutic activities;Cognitive remediation/compensation    OT Goals(Current goals can be found in the care plan section) Acute Rehab OT Goals Patient Stated Goal: go home OT Goal Formulation: With patient Time For Goal Achievement: 08/26/22 Potential to Achieve Goals: Fair ADL Goals Pt Will Perform Grooming: with modified independence;sitting Pt Will Perform Lower Body Dressing: with modified independence;sit to/from stand Pt Will Transfer to Toilet: with modified independence Pt Will Perform Toileting - Clothing Manipulation and hygiene: with modified independence  OT Frequency: Min 2X/week    Co-evaluation              AM-PAC OT "6 Clicks" Daily Activity     Outcome Measure Help from another person eating meals?: A Little Help from another person taking care of personal grooming?: A Little Help from another person toileting, which includes using toliet, bedpan, or urinal?: A Lot Help from another person bathing (including washing, rinsing, drying)?: A Lot Help from another person to put on and taking off regular upper body clothing?: A Lot Help from another person to  put on and taking off regular lower body clothing?: A Lot 6 Click Score: 14   End of Session Nurse Communication: Mobility status  Activity Tolerance: Patient limited by pain Patient left: in bed;with call bell/phone within reach;with bed alarm set;with nursing/sitter in room  OT Visit Diagnosis: Unsteadiness on feet (R26.81);History of falling (Z91.81);Muscle weakness (generalized) (M62.81)                Time: 9417-4081 OT Time Calculation (min): 25 min Charges:   OT General Charges $OT Visit: 1 Visit OT Evaluation $OT Eval Moderate Complexity: 1 Mod  Shanon Payor, OTD OTR/L  08/12/22, 3:54 PM

## 2022-08-12 NOTE — Evaluation (Signed)
Physical Therapy Evaluation Patient Details Name: Summer [name] MRN: [id number] DOB: 1951-06-09 Today's Date: 08/12/2022  History of Present Illness  71 y.o. female with medical history significant for Diabetes with polyneuropathy with a chronic left foot wound previously seen at the wound care clinic, OSA, CKD stage IIIb, depression, HTN, HFpEF, class III obesity, OSA and hypothyroidism,Who presents to the ED via EMS with frequent falls over the past 3 days, now with severe left knee pain.  L knee tap reveals gout.  Clinical Impression  Truncated session due to pt's pain levels and, essentially, unwillingness to try to push through some pain to do mobility, etc.  She was hypersensitive to any LE activity and even with very guarded/cautious effort with moving LEs she showed little tolerance with any real LE exercises or activity.  Will need OOB assessment when pain is better controlled and she has surgical shoe for L, but given today's severity of functional limitations she will likely require short term rehab barring significant change in LE pain levels.     Recommendations for follow up therapy are one component of a multi-disciplinary discharge planning process, led by the attending physician.  Recommendations may be updated based on patient status, additional functional criteria and insurance authorization.  Follow Up Recommendations Skilled nursing-short term rehab (<3 hours/day) Can patient physically be transported by private vehicle: No    Assistance Recommended at Discharge Frequent or constant Supervision/Assistance  Patient can return home with the following  Two people to help with walking and/or transfers;Two people to help with bathing/dressing/bathroom;Assistance with cooking/housework;Help with stairs or ramp for entrance    Equipment Recommendations None recommended by PT  Recommendations for Other Services       Functional Status Assessment Patient has had a recent decline in  their functional status and demonstrates the ability to make significant improvements in function in a reasonable and predictable amount of time.     Precautions / Restrictions Precautions Precautions: Fall Required Braces or Orthoses:  (L surgical shoe) Restrictions Weight Bearing Restrictions: Yes LLE Weight Bearing: Weight bearing as tolerated (with surgical shoe)      Mobility  Bed Mobility Overal bed mobility: Needs Assistance Bed Mobility: Supine to Sit, Sit to Supine           General bed mobility comments: Did not fully transition to EOB as pt screaming out in pain with essentially any LE movement (either LE) assisted or self selected - of note apparently she has gotten herself to sitting EOB at least once in the ED    Transfers   Equipment used:  (will need RW and surgical shoe)               General transfer comment: deferred due to severe pain levels and stating that she feels there is no way she could try standing at this time    Ambulation/Gait                  Stairs            Wheelchair Mobility    Modified Rankin (Stroke Patients Only)       Balance                                             Pertinent Vitals/Pain Pain Assessment Pain Assessment: 0-10 Pain Score: 10-Worst pain ever Pain Location: L knee and  foot, also reports severe (but less) pain in the R LE.  Pain averse to essentially all activity    Home Living Family/patient expects to be discharged to:: Private residence Living Arrangements: Spouse/significant other ("Live in boyfriend") Available Help at Discharge: Available PRN/intermittently   Home Access: Ramped entrance       Independence: One level [place]: Conservation officer, nature (2 wheels);Rollator (4 wheels);Toilet riser;Wheelchair - Brewing technologist - built in;Electric scooter;Hand held shower head      Prior Function Prior Level of Function : Needs assist              Mobility Comments: Pt could not answer most questions consistently/appropriately, appears she is able to do limited OOH activity       Hand Dominance        Extremity/Trunk Assessment   Upper Extremity Assessment Upper Extremity Assessment: Generalized weakness    Lower Extremity Assessment Lower Extremity Assessment:  (Pt tolerates little to no movement with either LE.  She screamed out in pain with even the most subtle and guarded movement with either LEs)       Communication   Communication: No difficulties  Cognition Arousal/Alertness: Awake/alert Behavior During Therapy: Anxious, Restless Overall Cognitive Status: Difficult to assess                                 General Comments: Pt showed poor awareness in general.  States she has been here 3 days (actually ~36 hrs) did not know date, could not describe where pain specifically was, impulsively resistant to any LE/mobility acts, etc        General Comments General comments (skin integrity, edema, etc.): limited PT exam due to severe pain as well as some apparent cognitive issues effecting her ability to consistent/appropriately interact with this Chief Strategy Officer    Exercises     Assessment/Plan    PT Assessment Patient needs continued PT services  PT Problem List Decreased strength;Decreased range of motion;Decreased activity tolerance;Decreased balance;Decreased knowledge of use of DME;Decreased safety awareness;Pain;Decreased cognition;Decreased mobility       PT Treatment Interventions DME instruction;Gait training;Stair training;Functional mobility training;Therapeutic activities;Therapeutic exercise;Balance training;Patient/family education;Cognitive remediation    PT Goals (Current goals can be found in the Care Plan section)  Acute Rehab PT Goals Patient Stated Goal: Pt hyperfocused on figuring out pain in LEs PT Goal Formulation: With patient Time For Goal Achievement: 08/25/22 Potential to  Achieve Goals: Fair    Frequency Min 2X/week     Co-evaluation               AM-PAC PT "6 Clicks" Mobility  Outcome Measure Help needed turning from your back to your side while in a flat bed without using bedrails?: Total Help needed moving from lying on your back to sitting on the side of a flat bed without using bedrails?: Total Help needed moving to and from a bed to a chair (including a wheelchair)?: Total Help needed standing up from a chair using your arms (e.g., wheelchair or bedside chair)?: Total Help needed to walk in hospital room?: Total Help needed climbing 3-5 steps with a railing? : Total 6 Click Score: 6    End of Session Equipment Utilized During Treatment: Oxygen Activity Tolerance: Patient limited by pain Patient left: with bed alarm set;with call bell/phone within reach Nurse Communication: Mobility status PT Visit Diagnosis: Muscle weakness (generalized) (M62.81);Difficulty in walking, not elsewhere classified (R26.2);Pain Pain - Right/Left:  Left Pain - part of body: Leg    Time: 3437-3578 PT Time Calculation (min) (ACUTE ONLY): 20 min   Charges:   PT Evaluation $PT Eval Low Complexity: 1 Low          Kreg Shropshire, DPT 08/12/2022, 12:26 PM

## 2022-08-12 NOTE — Progress Notes (Signed)
SLP Cancellation Note  Patient Details Name: TARRY BLAYNEY MRN: [id number] DOB: 01/03/1951   Cancelled treatment:       Reason Eval/Treat Not Completed: SLP screened, no needs identified, will sign off   Carilyn Woolston 08/12/2022, 12:52 PM

## 2022-08-12 NOTE — Progress Notes (Signed)
Pt refused CPAP. Pt aware if she changes her mind a CPAP will be made available to her.

## 2022-08-12 NOTE — ED Notes (Signed)
Advised nurse that patient has ready bed 

## 2022-08-12 NOTE — Clinical Note (Incomplete)
About half of the women receiving abortions have undergone 1 or more abortions previously, which also indicates the cultural reliance on it is not contraception.

## 2022-08-13 DIAGNOSIS — T796XXA Traumatic ischemia of muscle, initial encounter: Secondary | ICD-10-CM | POA: Diagnosis not present

## 2022-08-13 DIAGNOSIS — E11621 Type 2 diabetes mellitus with foot ulcer: Secondary | ICD-10-CM

## 2022-08-13 DIAGNOSIS — B953 Streptococcus pneumoniae as the cause of diseases classified elsewhere: Secondary | ICD-10-CM

## 2022-08-13 DIAGNOSIS — N3000 Acute cystitis without hematuria: Secondary | ICD-10-CM | POA: Diagnosis not present

## 2022-08-13 DIAGNOSIS — N170 Acute kidney failure with tubular necrosis: Secondary | ICD-10-CM | POA: Diagnosis not present

## 2022-08-13 DIAGNOSIS — R6520 Severe sepsis without septic shock: Secondary | ICD-10-CM

## 2022-08-13 DIAGNOSIS — R7881 Bacteremia: Secondary | ICD-10-CM | POA: Diagnosis not present

## 2022-08-13 DIAGNOSIS — L97529 Non-pressure chronic ulcer of other part of left foot with unspecified severity: Secondary | ICD-10-CM | POA: Diagnosis not present

## 2022-08-13 DIAGNOSIS — A419 Sepsis, unspecified organism: Secondary | ICD-10-CM | POA: Diagnosis not present

## 2022-08-13 LAB — CBC
HCT: 30.4 % — ABNORMAL LOW (ref 36.0–46.0)
Hemoglobin: 10 g/dL — ABNORMAL LOW (ref 12.0–15.0)
MCH: 27.9 pg (ref 26.0–34.0)
MCHC: 32.9 g/dL (ref 30.0–36.0)
MCV: 84.9 fL (ref 80.0–100.0)
Platelets: 216 10*3/uL (ref 150–400)
RBC: 3.58 MIL/uL — ABNORMAL LOW (ref 3.87–5.11)
RDW: 14.9 % (ref 11.5–15.5)
WBC: 14.5 10*3/uL — ABNORMAL HIGH (ref 4.0–10.5)
nRBC: 0 % (ref 0.0–0.2)

## 2022-08-13 LAB — BASIC METABOLIC PANEL
Anion gap: 14 (ref 5–15)
BUN: 36 mg/dL — ABNORMAL HIGH (ref 8–23)
CO2: 23 mmol/L (ref 22–32)
Calcium: 9.6 mg/dL (ref 8.9–10.3)
Chloride: 103 mmol/L (ref 98–111)
Creatinine, Ser: 1.75 mg/dL — ABNORMAL HIGH (ref 0.44–1.00)
GFR, Estimated: 30 mL/min — ABNORMAL LOW (ref >60)
Glucose, Bld: 176 mg/dL — ABNORMAL HIGH (ref 70–99)
Potassium: 3.6 mmol/L (ref 3.5–5.1)
Sodium: 140 mmol/L (ref 135–145)

## 2022-08-13 LAB — CULTURE, BLOOD (ROUTINE X 2): Special Requests: ADEQUATE

## 2022-08-13 LAB — GLUCOSE, CAPILLARY
Glucose-Capillary: 183 mg/dL — ABNORMAL HIGH (ref 70–99)
Glucose-Capillary: 239 mg/dL — ABNORMAL HIGH (ref 70–99)
Glucose-Capillary: 198 mg/dL — ABNORMAL HIGH (ref 70–99)
Glucose-Capillary: 198 mg/dL — ABNORMAL HIGH (ref 70–99)

## 2022-08-13 LAB — MAGNESIUM: Magnesium: 2 mg/dL (ref 1.7–2.4)

## 2022-08-13 LAB — CK: Total CK: 3308 U/L — ABNORMAL HIGH (ref 38–234)

## 2022-08-13 MED ORDER — BISACODYL 10 MG RE SUPP
10.0000 mg | Freq: Every day | RECTAL | Status: DC | PRN
  Administered 2022-08-13: 10 mg via RECTAL
  Filled 2022-08-13: qty 1

## 2022-08-13 NOTE — TOC Initial Note (Signed)
Transition of Care [facility]) - Initial/Assessment Note    Patient Details  Name: Summer [name] MRN: [id number] Date of Birth: 11/04/1950  Transition of Care [facility]) CM/SW Contact:    Eileen Stanford, LCSW Phone Number: 08/13/2022, 12:28 PM  Clinical Narrative:    CSW spoke with pt in regards to dc plan. Pt state she is agreeable to SNF. Pt did not have a SNF preference. Pt agreeable to f/o. CSW will provide offers once available- pt understanding.               Expected Discharge Plan: Skilled Nursing Facility Barriers to Discharge: Continued Medical Work up   Patient Goals and CMS Choice Patient states their goals for this hospitalization and ongoing recovery are:: to get better   Choice offered to / list presented to : Patient  Expected Discharge Plan and Services Expected Discharge Plan: [place] In-house Referral: Clinical Social Work   Post Acute Care Choice: [place] Living arrangements for the past 2 months: [place]                                      Prior Living Arrangements/Services Living arrangements for the past 2 months: Single Family Home Lives with:: Significant Other Patient language and need for interpreter reviewed:: Yes Do you feel safe going back to the place where you live?: Yes      Need for Family Participation in Patient Care: Yes (Comment) Care giver support system in place?: Yes (comment)   Criminal Activity/Legal Involvement Pertinent to Current Situation/Hospitalization: No - Comment as needed  Activities of Daily Living Home Assistive Devices/Equipment: Eyeglasses, Gilford Rile (specify type) (4 wheels walker, shower chair with a back. BSC.) ADL Screening (condition at time of admission) Patient's cognitive ability adequate to safely complete daily activities?: Yes Is the patient deaf or have difficulty hearing?: No Does the patient have difficulty seeing, even when wearing glasses/contacts?: Yes Does  the patient have difficulty concentrating, remembering, or making decisions?: No Patient able to express need for assistance with ADLs?: Yes Does the patient have difficulty dressing or bathing?: Yes Independently performs ADLs?: No Communication: Independent Dressing (OT): Needs assistance Grooming: Needs assistance Feeding: Independent Bathing: Needs assistance Toileting: Needs assistance In/Out Bed: Needs assistance Walks in Home: Independent with device (comment) (4 wheels walker) Does the patient have difficulty walking or climbing stairs?: Yes Weakness of Legs: Both Weakness of Arms/Hands: None  Permission Sought/Granted Permission sought to share information with : Family Supports Permission granted to share information with : Yes, Verbal Permission Granted  Share Information with NAME: darrel     Permission granted to share info w Relationship: significant other     Emotional Assessment Appearance:: Appears stated age Attitude/Demeanor/Rapport: Engaged Affect (typically observed): Accepting Orientation: : Oriented to Self, Oriented to Place, Oriented to  Time, Oriented to Situation Alcohol / Substance Use: Not Applicable Psych Involvement: No (comment)  Admission diagnosis:  Multiple falls [R29.6] Severe sepsis ([place]) [A41.9, R65.20] Traumatic rhabdomyolysis, initial encounter ([place]) [T79.6XXA] Urinary tract infection with hematuria, site unspecified [N39.0, R31.9] Diabetic ulcer of left midfoot associated with diabetes mellitus due to underlying condition, limited to breakdown of skin ([place]) [V76.160, L97.421] Sepsis without acute organ dysfunction, due to unspecified organism [facility]) [A41.9] Patient Active Problem List   Diagnosis Date Noted   Severe sepsis ([place]) 08/12/2022   Bacteremia due to Streptococcus pneumoniae 08/12/2022   Acute pain  of left knee 75/88/3254   Metabolic acidosis 98/26/4158   Sepsis ([place]) 08/10/2022   Traumatic rhabdomyolysis ([place]) 08/10/2022    Urinary tract infection 08/10/2022   Ulcer of left foot due to type 2 diabetes mellitus ([place]) 08/10/2022   Acute renal failure superimposed on stage 3b chronic kidney disease ([place]) 08/10/2022   Major depression in remission ([place]) 07/04/2022   Atherosclerosis of aorta ([place]) 07/04/2022   Insomnia due to other mental disorder 07/04/2022   Amputated toe of left foot ([place]) 07/04/2022   Chronic obstructive pulmonary disease ([place]) 01/28/2021   Diabetes mellitus ([place]) 05/24/2020   B12 deficiency 05/24/2020   Vitamin D deficiency 05/24/2020   Metatarsalgia of left foot 01/15/2020   Major depressive disorder, recurrent episode, moderate ([name]) 04/04/2019   Left arm weakness 04/03/2019   (HFpEF) heart failure with preserved ejection fraction ([place]) 08/31/2017   Moderate persistent asthma 08/31/2017   Hyperlipidemia associated with type 2 diabetes mellitus ([place]) 06/14/2017   Uncontrolled type 2 diabetes mellitus with hyperglycemia, with long-term current use of insulin ([place]) 08/18/2016   Charcot foot due to diabetes mellitus ([place]) 02/22/2016   Acquired abduction deformity of foot 07/12/2015   Osteoarthritis of subtalar joint 07/12/2015   Arthritis of foot, degenerative 07/12/2015   Carpal tunnel syndrome 04/17/2015   Chronic constipation 04/17/2015   Insomnia, persistent 04/17/2015   Stage 3b chronic kidney disease ([place]) 04/17/2015   Decreased exercise tolerance 04/17/2015   Diabetes mellitus with polyneuropathy ([place]) 04/17/2015   Gastroesophageal reflux disease without esophagitis 04/17/2015   Bursitis, trochanteric 04/17/2015   Cephalalgia 04/17/2015   Benign hypertension 04/17/2015   Adult hypothyroidism 04/17/2015   Hearing loss 04/17/2015   Chronic recurrent major depressive disorder ([place]) 04/17/2015   Neurogenic claudication 04/17/2015   Morbid obesity with BMI of 40.0-44.9, adult ([place]) 04/17/2015   Hypo-ovarianism 04/17/2015   Perennial allergic rhinitis with seasonal variation  04/17/2015   Acne erythematosa 04/17/2015   Dyskinesia, tardive 04/17/2015   Memory loss 04/17/2015   Impingement syndrome of shoulder 04/17/2015   Dermatitis, stasis 04/17/2015   Obstructive sleep apnea 05/14/2014   Shortness of breath on exertion 05/06/2014   Mixed hyperlipidemia 02/06/2012   LBP (low back pain) 09/16/2008   PCP:  Steele Sizer, MD Pharmacy:   [facility] [address], [address] [place] [address] Phone: [phone] Fax: [phone]  [place], [place] - [address] [address] Phone: [phone] Fax: [phone]     Social Determinants of Health (SDOH) Interventions    Readmission Risk Interventions     No data to display

## 2022-08-13 NOTE — Progress Notes (Signed)
ID Brief Note ( chart check, not seen in person, covering remotely over the weekend). Asked for abtx recs for strep pneumo bacteremia   71 Y O Female with multiple comorbidties including DM w polyneuropathy with chronic left wound, obesity/OSA, hypothyroidism, CKD, depression, hypertension, HFpEF who presented to the ED on 12/2 frequent falls over the past 3 days with severe left leg pain  At ED febrile, tachycardic, tachypnea Labs remarkable for leukocytosis creatinine 1.89 CK elevated to 6135, lactic acid 0.8 X-ray showed mild to moderate left knee joint effusion  UA 11/30 unremarkable for UTI, no urine cx sent  Blood cultures 11/30 strep pneumoniae in both sets  S/p Left knee synovial fluid cx no organisms, cx pending, WBC is 83,382, neutrophilic, cloudy fluid, extracellular monosodium crystals noted. Started on steroids by Ortho. WBC rather too high and possibility of septic arthritis in the setting of strep pneumo bactermia  On ceftriaxone   Left foot DFU appears to be chronic on pic - seen by podiatry and recommeded wound care      Imaging of current admission reviewed.   Assessment  Strep pneumo bacteremia: possible sources could be pna as well as possible left knee. Unlikely to be UTI per chart review, no concerns for meningitis noted   Recommendations  Continue ceftriaxone (sensitive ) Repeat 2 sets of peripheral blood cx today  TTE  Strep urinary ag  Monitor CBC and CMP D/w Dr Atha Starks shah   Dr Delaine Lame back tomorrow for in person and full consult   Rosiland Oz, MD Infectious Disease Physician [facility] [address] [place], [place] [address] Phone: [phone]  Fax: [phone]

## 2022-08-13 NOTE — Assessment & Plan Note (Signed)
Continue IV Rocephin for now.  ID consult.  Potential source could be urine versus left great toe ulcer

## 2022-08-13 NOTE — NC FL2 (Signed)
[place] LEVEL OF CARE FORM     IDENTIFICATION  Patient Name: Summer [name] Birthdate: Nov 02, 1950 Sex: female Admission Date (Current Location): 08/10/2022  [facility] and [place] Number:  Engineering geologist and Address:  [facility], [address] [place], [place] [address]      Provider Number: [id number]  Attending Physician Name and Address:  Max Sane, MD  Relative Name and Phone Number:       Current Level of Care: Hospital Recommended Level of Care: [place] Prior Approval Number:    Date Approved/Denied:   PASRR Number: [id number]  Discharge Plan: SNF    Current Diagnoses: Patient Active Problem List   Diagnosis Date Noted   Severe sepsis ([place]) 08/12/2022   Bacteremia due to Streptococcus pneumoniae 08/12/2022   Acute pain of left knee 50/11/7046   Metabolic acidosis 88/91/6945   Sepsis ([place]) 08/10/2022   Traumatic rhabdomyolysis ([place]) 08/10/2022   Urinary tract infection 08/10/2022   Ulcer of left foot due to type 2 diabetes mellitus ([place]) 08/10/2022   Acute renal failure superimposed on stage 3b chronic kidney disease ([place]) 08/10/2022   Major depression in remission ([place]) 07/04/2022   Atherosclerosis of aorta ([place]) 07/04/2022   Insomnia due to other mental disorder 07/04/2022   Amputated toe of left foot ([place]) 07/04/2022   Chronic obstructive pulmonary disease ([place]) 01/28/2021   Diabetes mellitus ([place]) 05/24/2020   B12 deficiency 05/24/2020   Vitamin D deficiency 05/24/2020   Metatarsalgia of left foot 01/15/2020   Major depressive disorder, recurrent episode, moderate ([place]) 04/04/2019   Left arm weakness 04/03/2019   (HFpEF) heart failure with preserved ejection fraction ([place]) 08/31/2017   Moderate persistent asthma 08/31/2017   Hyperlipidemia associated with type 2 diabetes mellitus ([place]) 06/14/2017   Uncontrolled type 2 diabetes mellitus with hyperglycemia, with long-term current use of  insulin ([place]) 08/18/2016   Charcot foot due to diabetes mellitus ([place]) 02/22/2016   Acquired abduction deformity of foot 07/12/2015   Osteoarthritis of subtalar joint 07/12/2015   Arthritis of foot, degenerative 07/12/2015   Carpal tunnel syndrome 04/17/2015   Chronic constipation 04/17/2015   Insomnia, persistent 04/17/2015   Stage 3b chronic kidney disease ([place]) 04/17/2015   Decreased exercise tolerance 04/17/2015   Diabetes mellitus with polyneuropathy ([place]) 04/17/2015   Gastroesophageal reflux disease without esophagitis 04/17/2015   Bursitis, trochanteric 04/17/2015   Cephalalgia 04/17/2015   Benign hypertension 04/17/2015   Adult hypothyroidism 04/17/2015   Hearing loss 04/17/2015   Chronic recurrent major depressive disorder ([place]) 04/17/2015   Neurogenic claudication 04/17/2015   Morbid obesity with BMI of 40.0-44.9, adult ([place]) 04/17/2015   Hypo-ovarianism 04/17/2015   Perennial allergic rhinitis with seasonal variation 04/17/2015   Acne erythematosa 04/17/2015   Dyskinesia, tardive 04/17/2015   Memory loss 04/17/2015   Impingement syndrome of shoulder 04/17/2015   Dermatitis, stasis 04/17/2015   Obstructive sleep apnea 05/14/2014   Shortness of breath on exertion 05/06/2014   Mixed hyperlipidemia 02/06/2012   LBP (low back pain) 09/16/2008    Orientation RESPIRATION BLADDER Height & Weight     Self, Time, Situation, Place  Normal Continent Weight: 241 lb (109.3 kg) Height:  '5\' 2"'$  (157.5 cm)  BEHAVIORAL SYMPTOMS/MOOD NEUROLOGICAL BOWEL NUTRITION STATUS      Continent Diet (heart healthy/carb modified)  AMBULATORY STATUS COMMUNICATION OF NEEDS Skin   Extensive Assist Verbally Normal  Personal Care Assistance Level of Assistance  Feeding, Bathing, Dressing Bathing Assistance: Maximum assistance Feeding assistance: Independent Dressing Assistance: Maximum assistance     Functional Limitations Info  Hearing, Sight, Speech Sight Info:  Adequate Hearing Info: Adequate Speech Info: Adequate    SPECIAL CARE FACTORS FREQUENCY  PT (By licensed PT), OT (By licensed OT)     PT Frequency: 5x OT Frequency: 5x            Contractures Contractures Info: Not present    Additional Factors Info  Code Status, Allergies Code Status Info: DNR Allergies Info: Codeine, Atorvastatin, Hydrocodone, Tramadol, Latex, Zolpidem           Current Medications (08/13/2022):  This is the current hospital active medication list Current Facility-Administered Medications  Medication Dose Route Frequency Provider Last Rate Last Admin   0.9 %  sodium chloride infusion   Intravenous Continuous Max Sane, MD 50 mL/hr at 08/13/22 0107 Infusion Verify at 08/13/22 0107   acetaminophen (TYLENOL) tablet 650 mg  650 mg Oral Q6H PRN Athena Masse, MD   650 mg at 08/13/22 0000   Or   acetaminophen (TYLENOL) suppository 650 mg  650 mg Rectal Q6H PRN Athena Masse, MD       albuterol (PROVENTIL) (2.5 MG/3ML) 0.083% nebulizer solution 2.5 mg  2.5 mg Nebulization Q6H PRN Renda Rolls, RPH       amLODipine (NORVASC) tablet 5 mg  5 mg Oral Daily Athena Masse, MD   5 mg at 08/12/22 2637   aspirin EC tablet 81 mg  81 mg Oral Daily Athena Masse, MD   81 mg at 08/13/22 0930   bisacodyl (DULCOLAX) suppository 10 mg  10 mg Rectal Daily PRN Sharion Settler, NP   10 mg at 08/13/22 0630   buPROPion (WELLBUTRIN XL) 24 hr tablet 150 mg  150 mg Oral Daily Judd Gaudier V, MD   150 mg at 08/13/22 0931   cefTRIAXone (ROCEPHIN) 2 g in sodium chloride 0.9 % 100 mL IVPB  2 g Intravenous Q24H Athena Masse, MD   Stopped at 08/12/22 1409   enoxaparin (LOVENOX) injection 55 mg  0.5 mg/kg Subcutaneous Q24H Athena Masse, MD   55 mg at 08/13/22 0931   ezetimibe (ZETIA) tablet 10 mg  10 mg Oral Daily Athena Masse, MD   10 mg at 08/13/22 0931   HYDROcodone-acetaminophen (NORCO/VICODIN) 5-325 MG per tablet 1-2 tablet  1-2 tablet Oral Q4H PRN Athena Masse,  MD   2 tablet at 08/11/22 2313   icosapent Ethyl (VASCEPA) 1 g capsule 2 g  2 g Oral BID Judd Gaudier V, MD   2 g at 08/13/22 8588   insulin aspart (novoLOG) injection 0-20 Units  0-20 Units Subcutaneous TID WC Athena Masse, MD   4 Units at 08/13/22 0920   insulin aspart (novoLOG) injection 0-5 Units  0-5 Units Subcutaneous QHS Athena Masse, MD   2 Units at 08/11/22 2259   insulin glargine-yfgn (SEMGLEE) injection 10 Units  10 Units Subcutaneous QHS Athena Masse, MD   10 Units at 08/12/22 2156   levothyroxine (SYNTHROID) tablet 25 mcg  25 mcg Oral Once per day on Mon Tue Wed Thu Fri Sat Renda Rolls, RPH   25 mcg at 08/12/22 5027   And   levothyroxine (SYNTHROID) tablet 50 mcg  50 mcg Oral Once per day on Sun Renda Rolls, RPH       metoprolol tartrate (LOPRESSOR)  tablet 25 mg  25 mg Oral BID Judd Gaudier V, MD   25 mg at 08/12/22 2154   modafinil (PROVIGIL) tablet 100 mg  100 mg Oral Daily Judd Gaudier V, MD   100 mg at 08/13/22 0930   ondansetron (ZOFRAN) tablet 4 mg  4 mg Oral Q6H PRN Athena Masse, MD   4 mg at 08/12/22 2242   Or   ondansetron (ZOFRAN) injection 4 mg  4 mg Intravenous Q6H PRN Athena Masse, MD   4 mg at 08/13/22 0526   pantoprazole (PROTONIX) EC tablet 40 mg  40 mg Oral Daily Athena Masse, MD   40 mg at 08/13/22 0929   predniSONE (DELTASONE) tablet 40 mg  40 mg Oral Q breakfast Sharen Hones, MD   40 mg at 08/13/22 0830   sertraline (ZOLOFT) tablet 150 mg  150 mg Oral Daily Judd Gaudier V, MD   150 mg at 08/13/22 4008   traZODone (DESYREL) tablet 100 mg  100 mg Oral QHS PRN Athena Masse, MD   100 mg at 08/12/22 2242     Discharge Medications: Please see discharge summary for a list of discharge medications.  Relevant Imaging Results:  Relevant Lab Results:   Additional Information SSN: [id number]  Eileen Stanford, LCSW

## 2022-08-13 NOTE — Progress Notes (Signed)
Progress Note   Patient: Summer [name] ZWC:[id number] DOB: 1951/03/09 DOA: 08/10/2022     3 DOS: the patient was seen and examined on 08/13/2022   Brief hospital course:  Summer [name] is a 71 y.o. female with medical history significant for Diabetes with polyneuropathy with a chronic left foot wound previously seen at the wound care clinic, OSA, CKD stage IIIb, depression, HTN, HFpEF, class III obesity, OSA and hypothyroidism,Who presents to the ED via EMS with frequent falls over the past 3 days, now with severe left knee pain.  Upon arriving the hospital, temperature was 101.1, HR 117, creatinine 1.89.  CK level 6435.  UA abnormal, x-ray showed mild to moderate left knee joint effusion. Patient was started on broad spectrum antibiotics for possible sepsis.  Seen by orthopedics, knee tap results support diagnosis of gout.    12/1: Podiatry recommends conservative management with dressing changes and weightbearing as tolerated with surgical shoe.  Left knee aspiration by Ortho the fluid suggestive of gout.  Started on steroids 12/2: PT, OT eval recommends SNF 12/3: Waiting for SNF placement, TOC aware, CK improving.  ID consult for strep pneumo bacteremia  Assessment and Plan: * Bacteremia due to Streptococcus pneumoniae Continue IV Rocephin for now.  ID consult.  Potential source could be urine versus left great toe ulcer  Urinary tract infection Urinalysis not strongly consistent with UTI we will follow cultures In the interim - continue Rocephin.  ID consult  Traumatic rhabdomyolysis ([place]) Frequent falls CK over 6000-> 3308 Continue IV hydration  PT, OT recommend SNF.  TOC aware looking for placement  Sepsis ([place]) Etiology possibly related to UTI and or left foot ulcer Sepsis criteria includes fever, tachycardia, leukocytosis and AKI.  Lactic acid was normal Continue empiric antibiotics.  ID consult  Acute pain of left knee Patient with severe 10/10 pain left knee on admission.   Seen by Ortho who did aspiration of left knee with fluid consistent with gout  Left knee xray IMPRESSION: 1. Moderate severity tricompartmental osteoarthritis. 2. Small to moderate sized joint effusion  Started on steroids.  Pain much better controlled now  Ulcer of left foot due to type 2 diabetes mellitus ([name]) Left foot/great toe ulceration without underlying osteomyelitis Seen by podiatry.  Thought to be due to hallux malleus contracture leading to excessive stress to the distal tip of the hallux and therefore ulceration.  Continue Betadine wet-to-dry dressing, weightbearing as tolerated with surgical shoe.  No surgery plans per podiatry.  Outpatient podiatry follow-up with Dr. Amalia Hailey in 1 week post discharge Continue IV Rocephin for now  Uncontrolled type 2 diabetes mellitus with hyperglycemia, with long-term current use of insulin ([name]) Continue insulin Semglee, sliding scale.  Last A1c about 2 months ago was 7.5  Acute renal failure superimposed on stage 3b chronic kidney disease ([place]) Secondary to sepsis and possibly rhabdo Continue IV hydration Hold metformin.  Avoid nephrotoxins  Lab Results  Component Value Date   CREATININE 1.75 (H) 08/13/2022   CREATININE 1.64 (H) 08/12/2022   CREATININE 1.90 (H) 08/11/2022   CREATININE 1.86 (H) 08/11/2022     Major depressive disorder, recurrent episode, moderate ([name]) Continue bupropion, sertraline and trazodone.  (HFpEF) heart failure with preserved ejection fraction ([name]) Clinically euvolemic to dry continue metoprolol.  Will hold furosemide due to AKI  Morbid obesity with BMI of 40.0-44.9, adult ([place]) Complicating factor to overall prognosis and care.  Adult hypothyroidism Continue levothyroxine.  Obstructive sleep apnea Continue CPAP.  Subjective: Blood pressure was low earlier in the morning but now rebounding.  Her pain is better controlled.  Agreeable for rehab placement  Physical Exam: Vitals:    08/12/22 2114 08/13/22 0514 08/13/22 0647 08/13/22 0945  BP: (!) 158/66 (!) 151/68 (!) 88/79 (!) 169/64  Pulse: 96 84 84 80  Resp: '18 18 20 16  '$ Temp: 98.9 F (37.2 C) 98.7 F (37.1 C) 98.9 F (37.2 C) 98.1 F (36.7 C)  TempSrc: Oral  Oral Oral  SpO2: 96% 97% 99% 100%  Weight:      Height:       71 year old female lying in the bed comfortably without any acute distress Lungs clear to auscultation bilaterally Cardiovascular regular rate and rhythm Abdomen soft, benign Neuro alert and awake, nonfocal Extremities/skin: See picture below  Data Reviewed:  Creatinine 1.75, CK 3308  Family Communication: None  Disposition: Status is: Inpatient Remains inpatient appropriate because: Management of strep pneumo bacteremia, rhabdomyolysis  Planned Discharge Destination: Skilled nursing facility   DVT prophylaxis-Lovenox Time spent: 35 minutes  Author: Max Sane, MD 08/13/2022 12:35 PM  For on call review [email or web address].

## 2022-08-14 ENCOUNTER — Inpatient Hospital Stay (HOSPITAL_COMMUNITY)
Admit: 2022-08-14 | Discharge: 2022-08-14 | Disposition: A | Discharge status: Home / Self Care | Payer: No Typology Code available for payment source | Attending: Internal Medicine | Admitting: Internal Medicine

## 2022-08-14 DIAGNOSIS — M109 Gout, unspecified: Secondary | ICD-10-CM

## 2022-08-14 DIAGNOSIS — A419 Sepsis, unspecified organism: Secondary | ICD-10-CM | POA: Diagnosis not present

## 2022-08-14 DIAGNOSIS — R7881 Bacteremia: Secondary | ICD-10-CM

## 2022-08-14 DIAGNOSIS — N179 Acute kidney failure, unspecified: Secondary | ICD-10-CM

## 2022-08-14 DIAGNOSIS — N1832 Chronic kidney disease, stage 3b: Secondary | ICD-10-CM | POA: Diagnosis not present

## 2022-08-14 DIAGNOSIS — M25562 Pain in left knee: Secondary | ICD-10-CM | POA: Diagnosis not present

## 2022-08-14 DIAGNOSIS — T796XXA Traumatic ischemia of muscle, initial encounter: Secondary | ICD-10-CM

## 2022-08-14 DIAGNOSIS — N3000 Acute cystitis without hematuria: Secondary | ICD-10-CM | POA: Diagnosis not present

## 2022-08-14 DIAGNOSIS — B953 Streptococcus pneumoniae as the cause of diseases classified elsewhere: Secondary | ICD-10-CM | POA: Diagnosis not present

## 2022-08-14 LAB — ECHOCARDIOGRAM COMPLETE
AR max vel: 3.6 cm2
AV Area VTI: 4.18 cm2
AV Area mean vel: 3.74 cm2
AV Mean grad: 4 mmHg
AV Peak grad: 7.9 mmHg
Ao pk vel: 1.41 m/s
Area-P 1/2: 4.29 cm2
Height: 62 in
S' Lateral: 2.3 cm
Weight: 3856 oz

## 2022-08-14 LAB — BASIC METABOLIC PANEL
Anion gap: 14 (ref 5–15)
BUN: 38 mg/dL — ABNORMAL HIGH (ref 8–23)
CO2: 23 mmol/L (ref 22–32)
Calcium: 9.6 mg/dL (ref 8.9–10.3)
Chloride: 104 mmol/L (ref 98–111)
Creatinine, Ser: 1.93 mg/dL — ABNORMAL HIGH (ref 0.44–1.00)
GFR, Estimated: 27 mL/min — ABNORMAL LOW (ref >60)
Glucose, Bld: 184 mg/dL — ABNORMAL HIGH (ref 70–99)
Potassium: 3.4 mmol/L — ABNORMAL LOW (ref 3.5–5.1)
Sodium: 141 mmol/L (ref 135–145)

## 2022-08-14 LAB — CBC
HCT: 29.8 % — ABNORMAL LOW (ref 36.0–46.0)
Hemoglobin: 9.8 g/dL — ABNORMAL LOW (ref 12.0–15.0)
MCH: 27.8 pg (ref 26.0–34.0)
MCHC: 32.9 g/dL (ref 30.0–36.0)
MCV: 84.4 fL (ref 80.0–100.0)
Platelets: 244 10*3/uL (ref 150–400)
RBC: 3.53 MIL/uL — ABNORMAL LOW (ref 3.87–5.11)
RDW: 15.3 % (ref 11.5–15.5)
WBC: 15.2 10*3/uL — ABNORMAL HIGH (ref 4.0–10.5)
nRBC: 0 % (ref 0.0–0.2)

## 2022-08-14 LAB — GLUCOSE, CAPILLARY
Glucose-Capillary: 204 mg/dL — ABNORMAL HIGH (ref 70–99)
Glucose-Capillary: 241 mg/dL — ABNORMAL HIGH (ref 70–99)
Glucose-Capillary: 203 mg/dL — ABNORMAL HIGH (ref 70–99)
Glucose-Capillary: 192 mg/dL — ABNORMAL HIGH (ref 70–99)

## 2022-08-14 LAB — MAGNESIUM: Magnesium: 2 mg/dL (ref 1.7–2.4)

## 2022-08-14 LAB — CK: Total CK: 1958 U/L — ABNORMAL HIGH (ref 38–234)

## 2022-08-14 LAB — URIC ACID: Uric Acid, Serum: 10 mg/dL — ABNORMAL HIGH (ref 2.5–7.1)

## 2022-08-14 MED ORDER — POTASSIUM CHLORIDE CRYS ER 20 MEQ PO TBCR
40.0000 meq | EXTENDED_RELEASE_TABLET | Freq: Once | ORAL | Status: AC
  Administered 2022-08-14: 40 meq via ORAL
  Filled 2022-08-14: qty 2

## 2022-08-14 MED ORDER — ALLOPURINOL 100 MG PO TABS
50.0000 mg | ORAL_TABLET | Freq: Every day | ORAL | Status: DC
  Administered 2022-08-14 – 2022-08-28 (×14): 50 mg via ORAL
  Filled 2022-08-14 (×15): qty 1

## 2022-08-14 MED ORDER — SODIUM CHLORIDE 0.9 % IV SOLN: INTRAVENOUS | Status: DC

## 2022-08-14 NOTE — Progress Notes (Signed)
*  PRELIMINARY RESULTS* Echocardiogram 2D Echocardiogram has been performed.  [name] 08/14/2022, 8:40 AM

## 2022-08-14 NOTE — Assessment & Plan Note (Signed)
Continue levothyroxine 

## 2022-08-14 NOTE — Assessment & Plan Note (Signed)
Patient with severe 10/10 pain left knee on admission.  Seen by Ortho who did aspiration of left knee with fluid consistent with gout  Left knee xray IMPRESSION: 1. Moderate severity tricompartmental osteoarthritis. 2. Small to moderate sized joint effusion  Started on steroids.  Pain much better controlled now.  Starting allopurinol 50 mg p.o. daily for maintenance regimen for gout

## 2022-08-14 NOTE — Assessment & Plan Note (Addendum)
Complicating factor to overall prognosis and care.  Palliative care consult

## 2022-08-14 NOTE — Assessment & Plan Note (Signed)
Clinically euvolemic to dry continue metoprolol.  Will hold furosemide due to AKI.

## 2022-08-14 NOTE — Progress Notes (Signed)
Mobility Specialist - Progress Note   08/14/22 1149  Mobility  Activity Dangled on edge of bed  Level of Assistance Modified independent, requires aide device or extra time  Assistive Device None  Activity Response Tolerated fair  Mobility Referral Yes  $Mobility charge 1 Mobility   Author responding to call for totalA +2. Pt supine upon entry, utilizing RA. Pt attempted to transfer to EOB ModI (extra time). Transfer to EOB unsuccessful, Pt refused to transferred after one attempt despite being educated of the importance of getting to the EOB. Pt dangled EOB, quickly returned supine, left with needs within reach.  Candie Mile Mobility Specialist 08/14/22 11:55 AM

## 2022-08-14 NOTE — Assessment & Plan Note (Signed)
Frequent falls CK over 6000-> 3308> 1958 Continue IV hydration  PT, OT recommend SNF.  TOC aware looking for placement

## 2022-08-14 NOTE — Assessment & Plan Note (Signed)
Urinalysis not strongly consistent with UTI.  No urine culture sent In the interim - continue Rocephin.  ID consult with Dr. Delaine Lame today

## 2022-08-14 NOTE — Inpatient Diabetes Management (Signed)
Inpatient Diabetes Program Recommendations  AACE/ADA: New Consensus Statement on Inpatient Glycemic Control (2015)  Target Ranges:  Prepandial:   less than 140 mg/dL      Peak postprandial:   less than 180 mg/dL (1-2 hours)      Critically ill patients:  140 - 180 mg/dL    Latest Reference Range & Units 08/13/22 08:39 08/13/22 12:25 08/13/22 17:00 08/13/22 22:55  Glucose-Capillary 70 - 99 mg/dL 183 (H)  4 units Novolog  239 (H)  7 units Novolog  198 (H)  4 units Novolog  198 (H)    10 units Semglee  (H): Data is abnormally high  Latest Reference Range & Units 08/14/22 08:57 08/14/22 12:13  Glucose-Capillary 70 - 99 mg/dL 204 (H)  7 units Novolog  241 (H)  (H): Data is abnormally high     Home DM Meds: 70/30 Insulin 100 units QPM + Metformin 500 BID + Libre 2 (see ENDO note below)    Current Meds: Semglee 10 units QHS   Novolog 0-20 units TID ac/hs  Prednisone 40 mg Daily    ENDO: Dr. Cline Crock seen 06/01/22 Was told to take the following:  70/30 Insulin 50 units AM/ 100 units PM Metformin 500 mg BID  Farxiga 10 mg daily   MD- Please consider:  1. Increase the Semglee slightly to 12 units QHS  2. Start Novolog Meal Coverage: Novolog 4 units TID with meals HOLD if pt NPO HOLD if pt eats <50% meals    --Will follow patient during hospitalization--  Wyn Quaker RN, MSN, [place] Diabetes Coordinator Inpatient Glycemic Control Team Team Pager: [phone] (8a-5p)

## 2022-08-14 NOTE — Assessment & Plan Note (Signed)
Secondary to sepsis and possibly rhabdo Continue IV hydration Hold metformin.  Avoid nephrotoxins  Lab Results  Component Value Date   CREATININE 1.93 (H) 08/14/2022   CREATININE 1.75 (H) 08/13/2022   CREATININE 1.64 (H) 08/12/2022

## 2022-08-14 NOTE — Progress Notes (Signed)
Physical Therapy Treatment Patient Details Name: Summer [name] MRN: [id number] DOB: 1951/05/02 Today's Date: 08/14/2022   History of Present Illness 71 y.o. female with medical history significant for Diabetes with polyneuropathy with a chronic left foot wound previously seen at the wound care clinic, OSA, CKD stage IIIb, depression, HTN, HFpEF, class III obesity, OSA and hypothyroidism,Who presents to the ED via EMS with frequent falls over the past 3 days, now with severe left knee pain.  L knee tap reveals gout.    PT Comments    Pt found supine in bed with c/o 10/10 pain in LLE. Pt educated on WB precautions and use of ortho shoe. Pt attempted supine to sit transfer, reported fatigue, and refused another attempt. Pt heavily educated on importance of mobility. Pt acknowledged education and reported she "already tried." Pt would benefit from skilled physical therapy to address the listed deficits (see below) to increase independence with ADLs and function. Current recommendation remains appropriate.     Recommendations for follow up therapy are one component of a multi-disciplinary discharge planning process, led by the attending physician.  Recommendations may be updated based on patient status, additional functional criteria and insurance authorization.  Follow Up Recommendations  Skilled nursing-short term rehab (<3 hours/day) Can patient physically be transported by private vehicle: No   Assistance Recommended at Discharge Frequent or constant Supervision/Assistance  Patient can return home with the following Two people to help with walking and/or transfers;Two people to help with bathing/dressing/bathroom;Assistance with cooking/housework;Help with stairs or ramp for entrance   Equipment Recommendations  None recommended by PT    Recommendations for Other Services       Precautions / Restrictions Precautions Precautions: Fall Restrictions Weight Bearing Restrictions: Yes LLE  Weight Bearing: Weight bearing as tolerated Other Position/Activity Restrictions: L post op shoe     Mobility  Bed Mobility Overal bed mobility: Needs Assistance Bed Mobility: Supine to Sit (not completed s/s fatigue)     Supine to sit: Supervision          Transfers                   General transfer comment: pt refused to try the transfer again after one attempt    Ambulation/Gait                   Stairs             Wheelchair Mobility    Modified Rankin (Stroke Patients Only)       Balance                                            Cognition Arousal/Alertness: Awake/alert Behavior During Therapy: Anxious Overall Cognitive Status: No family/caregiver present to determine baseline cognitive functioning                                 General Comments: Pt unaware of the time of day and anxiety limited mobility        Exercises      General Comments        Pertinent Vitals/Pain Pain Assessment Pain Score: 10-Worst pain ever Pain Location: L foot Pain Descriptors / Indicators: Discomfort, Grimacing, Guarding, Aching Pain Intervention(s): Limited activity within patient's tolerance, Monitored during session, Premedicated before session    Home  Living                          Prior Function            PT Goals (current goals can now be found in the care plan section) Acute Rehab PT Goals Patient Stated Goal: to rest PT Goal Formulation: With patient Time For Goal Achievement: 08/25/22 Potential to Achieve Goals: Fair Progress towards PT goals: Progressing toward goals    Frequency    Min 2X/week      PT Plan Current plan remains appropriate    Co-evaluation              AM-PAC PT "6 Clicks" Mobility   Outcome Measure  Help needed turning from your back to your side while in a flat bed without using bedrails?: Total Help needed moving from lying on your back to  sitting on the side of a flat bed without using bedrails?: Total Help needed moving to and from a bed to a chair (including a wheelchair)?: Total Help needed standing up from a chair using your arms (e.g., wheelchair or bedside chair)?: Total Help needed to walk in hospital room?: Total Help needed climbing 3-5 steps with a railing? : Total 6 Click Score: 6    End of Session Equipment Utilized During Treatment: Other (comment) (none) Activity Tolerance: Patient limited by fatigue Patient left: with bed alarm set;with call bell/phone within reach;in bed Nurse Communication: Mobility status PT Visit Diagnosis: Muscle weakness (generalized) (M62.81);Difficulty in walking, not elsewhere classified (R26.2);Pain Pain - Right/Left: Left Pain - part of body: Ankle and joints of foot     Time: 7622-6333 PT Time Calculation (min) (ACUTE ONLY): 16 min  Charges:                        Aletha Halim, SPT  08/14/2022, 12:23 PM

## 2022-08-14 NOTE — Assessment & Plan Note (Signed)
Left foot/great toe ulceration without underlying osteomyelitis Seen by podiatry.  Thought to be due to hallux malleus contracture leading to excessive stress to the distal tip of the hallux and therefore ulceration.  Continue Betadine wet-to-dry dressing, weightbearing as tolerated with surgical shoe.  No surgery plans per podiatry.  Outpatient podiatry follow-up with Dr. Amalia Hailey in 1 week post discharge Continue IV Rocephin for now

## 2022-08-14 NOTE — Consult Note (Signed)
NAME: Summer [name]  DOB: Aug 11, 1951  MRN: [id number]  Date/Time: 08/14/2022 11:18 AM  REQUESTING PROVIDER: Dr.Shah Subjective:  REASON FOR CONSULT: strep pneumo bacteremia ? Summer [name] is a 71 y.o. with a history of AFIB, DM with neuropathy and toe ulcer, HTN , OSA, CKD stage 2b, depression, HTN , HFpEF, obesity, hypothyroidism presented to the ED onb 08/10/22 with left knee pain after a fall in the shower on 08/09/22. Pt has had frequent falls for 3 days After the last fall she lay on the floor for 3 days She denied any fever, cough, sob In the ED vitals  08/10/22  BP 174/65 !  Temp 101.1 F (38.4 C) !  Pulse Rate 117 !  Resp 23 !  SpO2 100 %    Latest Reference Range & Units 08/10/22  WBC 4.0 - 10.5 K/uL 18.2 (H)  Hemoglobin 12.0 - 15.0 g/dL 11.7 (L)  HCT 36.0 - 46.0 % 35.4 (L)  Platelets 150 - 400 K/uL 182  Creatinine 0.44 - 1.00 mg/dL 1.89 (H)   Blood culture sent, and was started on IV vanco and Iv ceftriaxone 08/10/22 CXR posisble patchy opacities bases Xray foot fusion of subtalar joints left  Left knee xray showed fluid Seen by orhto the next day and the left knee was aspirated on 08/11/22  38K wbc ( 93% N) and MSU crystals -started on prednisone for gout  Blood culture came back as strep pneumo And she is on ceftriaxone. Synovial fluid culture neg so far She has had chronic left great toe wound and also a left lateral foot wound Charcot foot Also seen by podiatrist- -recommended local wound care  Past Medical History:  Diagnosis Date   Anemia    Arthritis    Asthma    B12 deficiency    Back pain    Carpal tunnel syndrome    Chronic kidney disease    Constipation    Depression    Depressive disorder    Diabetes mellitus    Dyspnea    Fluid retention    Foot pain    GERD (gastroesophageal reflux disease)    Headache    History of hiatal hernia    Hyperlipidemia    Hypertension    IBS (irritable bowel syndrome)    Insomnia    Joint pain    Lumbago     Memory loss    Obesity    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)    Pneumonia    Rhinitis, allergic    Rosacea    Sleep apnea    SOB (shortness of breath)    Thyroid disease    Unspecified hearing loss    Unspecified hereditary and idiopathic peripheral neuropathy    Unspecified sleep apnea    Vitamin D deficiency     Past Surgical History:  Procedure Laterality Date   ABDOMINAL HYSTERECTOMY  1975   ANKLE SURGERY Left approx Jan 2018   [place]   CATARACT EXTRACTION  01/2011   right   COLONOSCOPY WITH PROPOFOL N/A 08/19/2019   Procedure: COLONOSCOPY WITH PROPOFOL;  Surgeon: Jonathon Bellows, MD;  Location: [facility];  Service: Gastroenterology;  Laterality: N/A;   eye lid surgery  2013   bilateral   FOOT SURGERY     LOWER EXTREMITY ANGIOGRAPHY Left 11/23/2020   Procedure: LOWER EXTREMITY ANGIOGRAPHY;  Surgeon: Katha Cabal, MD;  Location: [facility];  Service: Cardiovascular;  Laterality: Left;   NECK SURGERY     SPINE SURGERY  TUBAL LIGATION     VAGINAL HYSTERECTOMY  1989    Social History   Socioeconomic History   Marital status: Significant Other    Spouse name: Dealer   Number of children: 1   Years of education: Not on file   Highest education level: High school graduate  Occupational History   Occupation: retired  Tobacco Use   Smoking status: Never   Smokeless tobacco: Never  Vaping Use   Vaping Use: Never used  Substance and Sexual Activity   Alcohol use: No    Alcohol/week: 0.0 standard drinks of alcohol   Drug use: No   Sexual activity: Yes    Partners: Male  Other Topics Concern   Not on file  Social History Narrative   Son lives in [place] (retired from [employer]) and her granddaughter (27yr old[place]   Social Determinants of Health   Financial Resource Strain: [place](08/30/2021)   Overall Financial Resource Strain (CARDIA)    Difficulty of Paying Living Expenses: Somewhat hard  Food  Insecurity: Food Insecurity Present (08/12/2022)   Hunger Vital Sign    Worried About RGaryin the Last Year: Sometimes true    Ran Out of Food in the Last Year: Sometimes true  Transportation Needs: No Transportation Needs (08/12/2022)   PRAPARE - THydrologist(Medical): No    Lack of Transportation (Non-Medical): No  Physical Activity: Inactive (08/30/2021)   Exercise Vital Sign    Days of Exercise per Week: 0 days    Minutes of Exercise per Session: 0 min  Stress: No Stress Concern Present (08/30/2021)   [place]   Feeling of Stress : Not at all  Social Connections: Moderately Isolated (08/30/2021)   Social Connection and Isolation Panel [NHANES]    Frequency of Communication with Friends and Family: More than three times a week    Frequency of Social Gatherings with Friends and Family: Once a week    Attends Religious Services: Never    AMarine scientistor Organizations: No    Attends CArchivistMeetings: Never    Marital Status: Living with partner  Intimate Partner Violence: Not At Risk (08/12/2022)   Humiliation, Afraid, Rape, and Kick questionnaire    Fear of Current or Ex-Partner: No    Emotionally Abused: No    Physically Abused: No    Sexually Abused: No    Family History  Problem Relation Age of Onset   Aneurysm Mother    Aortic aneurysm Mother    Hypertension Mother    Hyperlipidemia Mother    Heart disease Mother    Obesity Mother    Heart attack Maternal Grandfather    Diabetes Maternal Grandfather    Allergies  Allergen Reactions   Codeine Other (See Comments)    "TRIPPED OUT"  DIDN'T LIKE THE WAY IT FELT   Atorvastatin     muscle pain   Hydrocodone     itching   Tramadol    Latex Rash   Zolpidem Other (See Comments)    Sleep walk   I? Current Facility-Administered Medications  Medication Dose Route Frequency Provider Last  Rate Last Admin   acetaminophen (TYLENOL) tablet 650 mg  650 mg Oral Q6H PRN DAthena Masse MD   650 mg at 08/14/22 0741   Or   acetaminophen (TYLENOL) suppository 650 mg  650 mg Rectal  Q6H PRN Athena Masse, MD       albuterol (PROVENTIL) (2.5 MG/3ML) 0.083% nebulizer solution 2.5 mg  2.5 mg Nebulization Q6H PRN Renda Rolls, RPH       amLODipine (NORVASC) tablet 5 mg  5 mg Oral Daily Athena Masse, MD   5 mg at 08/14/22 9373   aspirin EC tablet 81 mg  81 mg Oral Daily Athena Masse, MD   81 mg at 08/14/22 4287   bisacodyl (DULCOLAX) suppository 10 mg  10 mg Rectal Daily PRN Sharion Settler, NP   10 mg at 08/13/22 0630   buPROPion (WELLBUTRIN XL) 24 hr tablet 150 mg  150 mg Oral Daily Judd Gaudier V, MD   150 mg at 08/14/22 0917   cefTRIAXone (ROCEPHIN) 2 g in sodium chloride 0.9 % 100 mL IVPB  2 g Intravenous Q24H Athena Masse, MD   Stopped at 08/13/22 1456   enoxaparin (LOVENOX) injection 55 mg  0.5 mg/kg Subcutaneous Q24H Athena Masse, MD   55 mg at 08/14/22 0915   ezetimibe (ZETIA) tablet 10 mg  10 mg Oral Daily Athena Masse, MD   10 mg at 08/14/22 0917   HYDROcodone-acetaminophen (NORCO/VICODIN) 5-325 MG per tablet 1-2 tablet  1-2 tablet Oral Q4H PRN Athena Masse, MD   2 tablet at 08/14/22 0745   icosapent Ethyl (VASCEPA) 1 g capsule 2 g  2 g Oral BID Judd Gaudier V, MD   2 g at 08/14/22 0917   insulin aspart (novoLOG) injection 0-20 Units  0-20 Units Subcutaneous TID WC Athena Masse, MD   7 Units at 08/14/22 0915   insulin aspart (novoLOG) injection 0-5 Units  0-5 Units Subcutaneous QHS Athena Masse, MD   2 Units at 08/11/22 2259   insulin glargine-yfgn (SEMGLEE) injection 10 Units  10 Units Subcutaneous QHS Athena Masse, MD   10 Units at 08/13/22 2328   levothyroxine (SYNTHROID) tablet 25 mcg  25 mcg Oral Once per day on Mon Tue Wed Thu Fri Sat Renda Rolls, RPH   25 mcg at 08/14/22 6811   And   levothyroxine (SYNTHROID) tablet 50 mcg  50 mcg Oral Once  per day on Sun Renda Rolls, RPH       metoprolol tartrate (LOPRESSOR) tablet 25 mg  25 mg Oral BID Athena Masse, MD   25 mg at 08/14/22 0917   modafinil (PROVIGIL) tablet 100 mg  100 mg Oral Daily Judd Gaudier V, MD   100 mg at 08/14/22 0918   ondansetron (ZOFRAN) tablet 4 mg  4 mg Oral Q6H PRN Athena Masse, MD   4 mg at 08/12/22 2242   Or   ondansetron (ZOFRAN) injection 4 mg  4 mg Intravenous Q6H PRN Athena Masse, MD   4 mg at 08/13/22 0526   pantoprazole (PROTONIX) EC tablet 40 mg  40 mg Oral Daily Athena Masse, MD   40 mg at 08/14/22 0917   predniSONE (DELTASONE) tablet 40 mg  40 mg Oral Q breakfast Sharen Hones, MD   40 mg at 08/14/22 0916   sertraline (ZOLOFT) tablet 150 mg  150 mg Oral Daily Athena Masse, MD   150 mg at 08/14/22 0917   traZODone (DESYREL) tablet 100 mg  100 mg Oral QHS PRN Athena Masse, MD   100 mg at 08/13/22 2328     Abtx:  Anti-infectives (From admission, onward)    Start  Dose/Rate Route Frequency Ordered Stop   08/11/22 1200  cefTRIAXone (ROCEPHIN) 2 g in sodium chloride 0.9 % 100 mL IVPB        2 g 200 mL/hr over 30 Minutes Intravenous Every 24 hours 08/11/22 0002 08/18/22 1159   08/11/22 0800  vancomycin (VANCOREADY) IVPB 500 mg/100 mL  Status:  Discontinued        500 mg 100 mL/hr over 60 Minutes Intravenous Every 24 hours 08/11/22 0202 08/12/22 0854   08/10/22 2300  cefTRIAXone (ROCEPHIN) 1 g in sodium chloride 0.9 % 100 mL IVPB        1 g 200 mL/hr over 30 Minutes Intravenous  Once 08/10/22 2245 08/11/22 0013   08/10/22 2300  vancomycin (VANCOREADY) IVPB 2000 mg/400 mL        2,000 mg 200 mL/hr over 120 Minutes Intravenous  Once 08/10/22 2252 08/11/22 0356       REVIEW OF SYSTEMS:  Const: negative fever, negative chills, negative weight loss Eyes: negative diplopia or visual changes, negative eye pain ENT: negative coryza, negative sore throat Resp: negative cough, hemoptysis, dyspnea Cards: negative for chest pain,  palpitations, lower extremity edema GU: negative for frequency, dysuria and hematuria GI: Negative for abdominal pain, diarrhea, bleeding, constipation Skin: negative for rash and pruritus Heme: negative for easy bruising and gum/nose bleeding MS: weakness, falls, left knee pain Neurolo:negative for headaches, dizziness, vertigo, memory problems  Psych:  anxiety Endocrine:  diabetes Allergy/Immunology- as above Objective:  VITALS:  BP (!) 142/64 (BP Location: Right Arm) Comment: RN notified  Pulse 68   Temp 98.2 F (36.8 C) (Oral)   Resp 18   Ht '5\' 2"'$  (1.575 m)   Wt 109.3 kg   SpO2 98%   BMI 44.08 kg/m   PHYSICAL EXAM:  General: Alert,restless, obese ,pale Head: Normocephalic, without obvious abnormality, atraumatic. Eyes: Conjunctivae clear, anicteric sclerae. Pupils are equal ENT Nares normal. No drainage or sinus tenderness. Upper lip scab Neck: Supple, symmetrical, no adenopathy, thyroid: non tender no carotid bruit and no JVD. Back: No CVA tenderness. Lungs: b/l air entry-  Heart: Regular rate and rhythm, no murmur, rub or gallop. Abdomen: Soft, non-tender,not distended. Bowel sounds normal. No masses Extremities:left  knee swollen Able to move the knee Restless legs Skin: No rashes or lesions. Or bruising Lymph: Cervical, supraclavicular normal. Neurologic: Grossly non-focal Pertinent Labs Lab Results CBC    Component Value Date/Time   WBC 15.2 (H) 08/14/2022 0400   RBC 3.53 (L) 08/14/2022 0400   HGB 9.8 (L) 08/14/2022 0400   HGB 11.1 05/13/2020 1146   HCT 29.8 (L) 08/14/2022 0400   HCT 33.7 (L) 05/13/2020 1146   PLT 244 08/14/2022 0400   PLT 153 05/13/2020 1146   MCV 84.4 08/14/2022 0400   MCV 87 05/13/2020 1146   MCH 27.8 08/14/2022 0400   MCHC 32.9 08/14/2022 0400   RDW 15.3 08/14/2022 0400   RDW 15.0 05/13/2020 1146   LYMPHSABS 0.9 08/10/2022 1830   LYMPHSABS 1.3 05/13/2020 1146   MONOABS 2.2 (H) 08/10/2022 1830   EOSABS 0.0 08/10/2022 1830    EOSABS 0.4 05/13/2020 1146   BASOSABS 0.1 08/10/2022 1830   BASOSABS 0.1 05/13/2020 1146       Latest Ref Rng & Units 08/14/2022    4:00 AM 08/13/2022    4:58 AM 08/12/2022    4:05 AM  CMP  Glucose 70 - 99 mg/dL 184  176  274   BUN 8 - 23 mg/dL 38  36  35  Creatinine 0.44 - 1.00 mg/dL 1.93  1.75  1.64   Sodium 135 - 145 mmol/L 141  140  138   Potassium 3.5 - 5.1 mmol/L 3.4  3.6  3.9   Chloride 98 - 111 mmol/L 104  103  102   CO2 22 - 32 mmol/L '23  23  25   '$ Calcium 8.9 - 10.3 mg/dL 9.6  9.6  9.0       Microbiology: Recent Results (from the past 240 hour(s))  Resp Panel by RT-PCR (Flu A&B, Covid) Anterior Nasal Swab     Status: None   Collection Time: 08/10/22  6:10 PM   Specimen: Anterior Nasal Swab  Result Value Ref Range Status   SARS Coronavirus 2 by RT PCR NEGATIVE NEGATIVE Final    Comment: (NOTE) SARS-CoV-2 target nucleic acids are NOT DETECTED.  The SARS-CoV-2 RNA is generally detectable in upper respiratory specimens during the acute phase of infection. The lowest concentration of SARS-CoV-2 viral copies this assay can detect is 138 copies/mL. A negative result does not preclude SARS-Cov-2 infection and should not be used as the sole basis for treatment or other patient management decisions. A negative result may occur with  improper specimen collection/handling, submission of specimen other than nasopharyngeal swab, presence of viral mutation(s) within the areas targeted by this assay, and inadequate number of viral copies(<138 copies/mL). A negative result must be combined with clinical observations, patient history, and epidemiological information. The expected result is Negative.  Fact Sheet for Patients:  EntrepreneurPulse.com.au  Fact Sheet for Healthcare Providers:  IncredibleEmployment.be  This test is no t yet approved or cleared by the [place] FDA and  has been authorized for detection and/or diagnosis of  SARS-CoV-2 by FDA under an Emergency Use Authorization (EUA). This EUA will remain  in effect (meaning this test can be used) for the duration of the COVID-19 declaration under Section 564(b)(1) of the Act, 21 U.S.C.section 360bbb-3(b)(1), unless the authorization is terminated  or revoked sooner.       Influenza A by PCR NEGATIVE NEGATIVE Final   Influenza B by PCR NEGATIVE NEGATIVE Final    Comment: (NOTE) The Xpert Xpress SARS-CoV-2/FLU/RSV plus assay is intended as an aid in the diagnosis of influenza from Nasopharyngeal swab specimens and should not be used as a sole basis for treatment. Nasal washings and aspirates are unacceptable for Xpert Xpress SARS-CoV-2/FLU/RSV testing.  Fact Sheet for Patients: EntrepreneurPulse.com.au  Fact Sheet for Healthcare Providers: IncredibleEmployment.be  This test is not yet approved or cleared by the [place] FDA and has been authorized for detection and/or diagnosis of SARS-CoV-2 by FDA under an Emergency Use Authorization (EUA). This EUA will remain in effect (meaning this test can be used) for the duration of the COVID-19 declaration under Section 564(b)(1) of the Act, 21 U.S.C. section 360bbb-3(b)(1), unless the authorization is terminated or revoked.  Performed at [facility], [address] [place], [place] [address]   Group A Strep by PCR     Status: None   Collection Time: 08/10/22  6:10 PM   Specimen: Anterior Nasal Swab; Sterile Swab  Result Value Ref Range Status   Group A Strep by PCR NOT DETECTED NOT DETECTED Final    Comment: Performed at [facility], [address] [place], [place] [address]  Culture, blood (routine x 2)     Status: Abnormal   Collection Time: 08/10/22 11:50 PM   Specimen: BLOOD RIGHT ARM  Result Value Ref Range Status  Specimen Description   Final    BLOOD RIGHT ARM Performed at [facility], [place] [address] [place],  [place] [address]    Special Requests   Final    BOTTLES DRAWN AEROBIC AND ANAEROBIC Blood Culture adequate volume Performed at [facility], [address] [place], [place] [address]    Culture  Setup Time   Final    GRAM POSITIVE COCCI ANAEROBIC BOTTLE ONLY CRITICAL RESULT CALLED TO, READ BACK BY AND VERIFIED WITH: TREY GREENWOOD AT 3007 08/11/22.PMF Performed at [facility], [address] [place], [place] [address]    Culture (A)  Final    STREPTOCOCCUS PNEUMONIAE SUSCEPTIBILITIES PERFORMED ON PREVIOUS CULTURE WITHIN THE LAST 5 DAYS. Performed at [facility], [place] [address] [place], [place] [address]    Report Status 08/13/2022 FINAL  Final  Culture, blood (routine x 2)     Status: Abnormal   Collection Time: 08/10/22 11:50 PM   Specimen: BLOOD LEFT HAND  Result Value Ref Range Status   Specimen Description   Final    BLOOD LEFT HAND Performed at [facility], [place] [address] [place], [place] [address]    Special Requests   Final    BOTTLES DRAWN AEROBIC AND ANAEROBIC Blood Culture results may not be optimal due to an inadequate volume of blood received in culture bottles Performed at [facility], [address] [place], [place] [address]    Culture  Setup Time   Final    GRAM POSITIVE COCCI IN BOTH AEROBIC AND ANAEROBIC BOTTLES CRITICAL RESULT CALLED TO, READ BACK BY AND VERIFIED WITH: TREY GREENWOOD AT 4287 08/11/22.PMF Performed at [facility], [place] [address], [place], [place] [address]    Culture STREPTOCOCCUS PNEUMONIAE (A)  Final   Report Status 08/13/2022 FINAL  Final   Organism ID, Bacteria STREPTOCOCCUS PNEUMONIAE  Final      Susceptibility   Streptococcus pneumoniae - MIC*    ERYTHROMYCIN <=0.12 SENSITIVE Sensitive     LEVOFLOXACIN 0.5 SENSITIVE Sensitive     VANCOMYCIN 0.25 SENSITIVE Sensitive     PENICILLIN (meningitis) <=0.06 SENSITIVE Sensitive     PENO - penicillin <=0.06      PENICILLIN (non-meningitis)  <=0.06 SENSITIVE Sensitive     PENICILLIN (oral) <=0.06 SENSITIVE Sensitive     CEFTRIAXONE (non-meningitis) <=0.12 SENSITIVE Sensitive     CEFTRIAXONE (meningitis) <=0.12 SENSITIVE Sensitive     * STREPTOCOCCUS PNEUMONIAE  Blood Culture ID Panel (Reflexed)     Status: Abnormal   Collection Time: 08/10/22 11:50 PM  Result Value Ref Range Status   Enterococcus faecalis NOT DETECTED NOT DETECTED Final   Enterococcus Faecium NOT DETECTED NOT DETECTED Final   Listeria monocytogenes NOT DETECTED NOT DETECTED Final   Staphylococcus species NOT DETECTED NOT DETECTED Final   Staphylococcus aureus (BCID) NOT DETECTED NOT DETECTED Final   Staphylococcus epidermidis NOT DETECTED NOT DETECTED Final   Staphylococcus lugdunensis NOT DETECTED NOT DETECTED Final   Streptococcus species DETECTED (A) NOT DETECTED Final    Comment: CRITICAL RESULT CALLED TO, READ BACK BY AND VERIFIED WITH: TREY GREENWOOD AT 1317 08/11/22.PMF    Streptococcus agalactiae NOT DETECTED NOT DETECTED Final   Streptococcus pneumoniae DETECTED (A) NOT DETECTED Final    Comment: CRITICAL RESULT CALLED TO, READ BACK BY AND VERIFIED WITH: TREY GREENWOOD AT 7262 08/11/22.PMF    Streptococcus pyogenes NOT DETECTED NOT DETECTED Final   A.calcoaceticus-baumannii NOT DETECTED NOT DETECTED Final   Bacteroides fragilis  NOT DETECTED NOT DETECTED Final   Enterobacterales NOT DETECTED NOT DETECTED Final   Enterobacter cloacae complex NOT DETECTED NOT DETECTED Final   Escherichia coli NOT DETECTED NOT DETECTED Final   Klebsiella aerogenes NOT DETECTED NOT DETECTED Final   Klebsiella oxytoca NOT DETECTED NOT DETECTED Final   Klebsiella pneumoniae NOT DETECTED NOT DETECTED Final   Proteus species NOT DETECTED NOT DETECTED Final   Salmonella species NOT DETECTED NOT DETECTED Final   Serratia marcescens NOT DETECTED NOT DETECTED Final   Haemophilus influenzae NOT DETECTED NOT DETECTED Final   Neisseria meningitidis NOT DETECTED NOT DETECTED  Final   Pseudomonas aeruginosa NOT DETECTED NOT DETECTED Final   Stenotrophomonas maltophilia NOT DETECTED NOT DETECTED Final   Candida albicans NOT DETECTED NOT DETECTED Final   Candida auris NOT DETECTED NOT DETECTED Final   Candida glabrata NOT DETECTED NOT DETECTED Final   Candida krusei NOT DETECTED NOT DETECTED Final   Candida parapsilosis NOT DETECTED NOT DETECTED Final   Candida tropicalis NOT DETECTED NOT DETECTED Final   Cryptococcus neoformans/gattii NOT DETECTED NOT DETECTED Final    Comment: Performed at [facility], [address] [place], [place] [address]  Anaerobic culture w Gram Stain     Status: None (Preliminary result)   Collection Time: 08/11/22 10:18 AM   Specimen: Synovium  Result Value Ref Range Status   Specimen Description SYNOVIAL  Final   Special Requests LEFT KNEE  Final   Gram Stain   Final    NO ORGANISMS SEEN RARE WBC PRESENT, PREDOMINANTLY PMN Performed at [facility], [address] [place], [place] [address]    Culture   Final    NO ANAEROBES ISOLATED; CULTURE IN PROGRESS FOR 5 DAYS   Report Status PENDING  Incomplete  Culture, blood (Routine X 2) w Reflex to ID Panel     Status: None (Preliminary result)   Collection Time: 08/13/22  2:05 PM   Specimen: BLOOD  Result Value Ref Range Status   Specimen Description BLOOD LEFT ANTECUBITAL  Final   Special Requests   Final    BOTTLES DRAWN AEROBIC AND ANAEROBIC Blood Culture adequate volume   Culture   Final    NO GROWTH < 24 HOURS Performed at [facility], [address] [place], [place] [address]    Report Status PENDING  Incomplete  Culture, blood (Routine X 2) w Reflex to ID Panel     Status: None (Preliminary result)   Collection Time: 08/13/22  2:05 PM   Specimen: BLOOD  Result Value Ref Range Status   Specimen Description BLOOD BLOOD LEFT HAND  Final   Special Requests   Final    BOTTLES DRAWN AEROBIC AND ANAEROBIC Blood Culture adequate volume   Culture    Final    NO GROWTH < 24 HOURS Performed at [facility], [address] [place], [place] [address]    Report Status PENDING  Incomplete    IMAGING RESULTS:  I have personally reviewed the films Hardware left ankle  ?possible patchy airspace opacities of lung base  Impression/Recommendation ? ?71 yr female presenting with multiple falls And Left knee pain or the past 3 daysl  Strep pneumo bacteremia Source currently not clear- could be from the left foot wound Concern for left knee secondary infection Underwent aspiration of the knee after a dose of ceftriaxone and vanco- Monosodium urate crystals in the fluid with cell count of > 25K- culture neg so far She is on prednisone  as per ortho for acute gout She could have infection and gout together- Would not dismiss infection because of neg synovial fluid culture as the culture was sent after she received antibiotic May not warranty I/D currently but would consider Dc steroids as soon as possible She needs TEE Will also send wound culture Continue ceftriaxone- duraiton will depend on above tests  Diabetes mellitus with neuropathy, charcot foot and wound left foot X 2 The left lateral foot wound appears to be infected, where as the great toie wound looks chronic  Rhabdomyolysis- due to fall  AKI  Charcot foot- H/o left Ankle surgey- has hardware    DM   Discussed the management with orthopedic, hospitalist and patient? ___________________________________________________

## 2022-08-14 NOTE — Assessment & Plan Note (Addendum)
Unclear source Sepsis criteria includes fever, tachycardia, leukocytosis and AKI.  Lactic acid was normal Continue empiric antibiotics.  ID consult Repeating blood culture, 2D echo

## 2022-08-14 NOTE — Assessment & Plan Note (Signed)
Continue CPAP.  

## 2022-08-14 NOTE — Assessment & Plan Note (Signed)
Continue IV Rocephin for now.  ID consult.  Unclear source

## 2022-08-14 NOTE — Assessment & Plan Note (Signed)
Continue bupropion, sertraline and trazodone.

## 2022-08-14 NOTE — Progress Notes (Signed)
Progress Note   Patient: Summer [name] XBM:[id number] DOB: 09/19/1950 DOA: 08/10/2022     4 DOS: the patient was seen and examined on 08/14/2022   Brief hospital course:  Summer [name] is a 71 y.o. female with medical history significant for Diabetes with polyneuropathy with a chronic left foot wound previously seen at the wound care clinic, OSA, CKD stage IIIb, depression, HTN, HFpEF, class III obesity, OSA and hypothyroidism,Who presents to the ED via EMS with frequent falls over the past 3 days, now with severe left knee pain.  Upon arriving the hospital, temperature was 101.1, HR 117, creatinine 1.89.  CK level 6435.  UA abnormal, x-ray showed mild to moderate left knee joint effusion. Patient was started on broad spectrum antibiotics for possible sepsis.  Seen by orthopedics, knee tap results support diagnosis of gout.    12/1: Podiatry recommends conservative management with dressing changes and weightbearing as tolerated with surgical shoe.  Left knee aspiration by Ortho the fluid suggestive of gout.  Started on steroids 12/2: PT, OT eval recommends SNF 12/3: Waiting for SNF placement, TOC aware, CK improving.  ID consult for strep pneumo bacteremia 12/4: Not motivated to work with therapy.  Palliative care consult, 2D echo and 2 sets of blood culture per ID, starting allopurinol for gout   Assessment and Plan: * Bacteremia due to Summer [name] Continue IV Rocephin for now.  ID consult.  Unclear source  Urinary tract infection Urinalysis not strongly consistent with UTI.  No urine culture sent In the interim - continue Rocephin.  ID consult with Dr. Delaine [name] today  Traumatic rhabdomyolysis (Summer [name]) Frequent falls CK over 6000-> 3308> 1958 Continue IV hydration  PT, OT recommend SNF.  TOC aware looking for placement  Sepsis ([place]) Unclear source Sepsis criteria includes fever, tachycardia, leukocytosis and AKI.  Lactic acid was normal Continue empiric antibiotics.  ID  consult Repeating blood culture, 2D echo  Acute pain of left knee Patient with severe 10/10 pain left knee on admission.  Seen by Ortho who did aspiration of left knee with fluid consistent with gout  Left knee xray IMPRESSION: 1. Moderate severity tricompartmental osteoarthritis. 2. Small to moderate sized joint effusion  Started on steroids.  Pain much better controlled now.  Starting allopurinol 50 mg p.o. daily for maintenance regimen for gout  Ulcer of left foot due to type 2 diabetes mellitus ([place]) Left foot/great toe ulceration without underlying osteomyelitis Seen by podiatry.  Thought to be due to hallux malleus contracture leading to excessive stress to the distal tip of the hallux and therefore ulceration.  Continue Betadine wet-to-dry dressing, weightbearing as tolerated with surgical shoe.  No surgery plans per podiatry.  Outpatient podiatry follow-up with Dr. Amalia [name] in 1 week post discharge Continue IV Rocephin for now  Uncontrolled type 2 diabetes mellitus with hyperglycemia, with long-term current use of insulin ([name]) Continue insulin Semglee, sliding scale.  Last A1c about 2 months ago was 7.5  Acute renal failure superimposed on stage 3b chronic kidney disease ([place]) Secondary to sepsis and possibly rhabdo Continue IV hydration Hold metformin.  Avoid nephrotoxins  Lab Results  Component Value Date   CREATININE 1.93 (H) 08/14/2022   CREATININE 1.75 (H) 08/13/2022   CREATININE 1.64 (H) 08/12/2022     Major depressive disorder, recurrent episode, moderate ([name]) Continue bupropion, sertraline and trazodone.  (HFpEF) heart failure with preserved ejection fraction ([name]) Clinically euvolemic to dry continue metoprolol.  Will hold furosemide due to AKI.  Morbid obesity with BMI  of 40.0-44.9, adult [facility]) Complicating factor to overall prognosis and care.  Palliative care consult  Adult hypothyroidism Continue levothyroxine.  Obstructive sleep apnea Continue  CPAP.        Subjective: Not motivated to work with therapy today.  Encouraged her to work with them  Physical Exam: Vitals:   08/13/22 2241 08/14/22 0529 08/14/22 0600 08/14/22 0800  BP: (!) 140/83 (!) 177/75 (!) 154/82 (!) 142/64  Pulse: 81 73  68  Resp: '18 16  18  '$ Temp: 98.4 F (36.9 C) 97.9 F (36.6 C)  98.2 F (36.8 C)  TempSrc: Oral Oral  Oral  SpO2: 100% 98%  98%  Weight:      Height:       71 year old female lying in the bed comfortably without any acute distress Lungs clear to auscultation bilaterally Cardiovascular regular rate and rhythm Abdomen soft, benign Neuro alert and awake, nonfocal Data Reviewed:  Potassium 3.4, creatinine 1.93, CK 1958  Family Communication: None  Disposition: Status is: Inpatient Remains inpatient appropriate because: Waiting for placement.,  ID consult for evaluation of strep pneumo bacteremia  Planned Discharge Destination: Skilled nursing facility   DVT prophylaxis-Lovenox Time spent: 35 minutes  Author: Max Sane, MD 08/14/2022 1:52 PM  For on call review [email or web address].

## 2022-08-14 NOTE — Assessment & Plan Note (Signed)
Continue insulin Semglee, sliding scale.  Last A1c about 2 months ago was 7.5

## 2022-08-15 DIAGNOSIS — E1122 Type 2 diabetes mellitus with diabetic chronic kidney disease: Secondary | ICD-10-CM | POA: Diagnosis not present

## 2022-08-15 DIAGNOSIS — Z7189 Other specified counseling: Secondary | ICD-10-CM | POA: Diagnosis not present

## 2022-08-15 DIAGNOSIS — L97529 Non-pressure chronic ulcer of other part of left foot with unspecified severity: Secondary | ICD-10-CM

## 2022-08-15 DIAGNOSIS — M009 Pyogenic arthritis, unspecified: Secondary | ICD-10-CM

## 2022-08-15 DIAGNOSIS — Z794 Long term (current) use of insulin: Secondary | ICD-10-CM

## 2022-08-15 DIAGNOSIS — N179 Acute kidney failure, unspecified: Secondary | ICD-10-CM | POA: Diagnosis not present

## 2022-08-15 DIAGNOSIS — M109 Gout, unspecified: Secondary | ICD-10-CM

## 2022-08-15 DIAGNOSIS — L97421 Non-pressure chronic ulcer of left heel and midfoot limited to breakdown of skin: Secondary | ICD-10-CM

## 2022-08-15 DIAGNOSIS — R296 Repeated falls: Secondary | ICD-10-CM | POA: Diagnosis not present

## 2022-08-15 DIAGNOSIS — E08621 Diabetes mellitus due to underlying condition with foot ulcer: Secondary | ICD-10-CM

## 2022-08-15 DIAGNOSIS — M25562 Pain in left knee: Secondary | ICD-10-CM | POA: Diagnosis not present

## 2022-08-15 DIAGNOSIS — N170 Acute kidney failure with tubular necrosis: Secondary | ICD-10-CM | POA: Diagnosis not present

## 2022-08-15 DIAGNOSIS — A419 Sepsis, unspecified organism: Secondary | ICD-10-CM | POA: Diagnosis not present

## 2022-08-15 DIAGNOSIS — B953 Streptococcus pneumoniae as the cause of diseases classified elsewhere: Secondary | ICD-10-CM | POA: Diagnosis not present

## 2022-08-15 DIAGNOSIS — E11621 Type 2 diabetes mellitus with foot ulcer: Secondary | ICD-10-CM

## 2022-08-15 DIAGNOSIS — R7881 Bacteremia: Secondary | ICD-10-CM | POA: Diagnosis not present

## 2022-08-15 DIAGNOSIS — R6520 Severe sepsis without septic shock: Secondary | ICD-10-CM

## 2022-08-15 DIAGNOSIS — T796XXA Traumatic ischemia of muscle, initial encounter: Secondary | ICD-10-CM | POA: Diagnosis not present

## 2022-08-15 DIAGNOSIS — N1832 Chronic kidney disease, stage 3b: Secondary | ICD-10-CM | POA: Diagnosis not present

## 2022-08-15 DIAGNOSIS — Z7984 Long term (current) use of oral hypoglycemic drugs: Secondary | ICD-10-CM

## 2022-08-15 LAB — BASIC METABOLIC PANEL
Anion gap: 13 (ref 5–15)
BUN: 46 mg/dL — ABNORMAL HIGH (ref 8–23)
CO2: 24 mmol/L (ref 22–32)
Calcium: 9.4 mg/dL (ref 8.9–10.3)
Chloride: 105 mmol/L (ref 98–111)
Creatinine, Ser: 1.8 mg/dL — ABNORMAL HIGH (ref 0.44–1.00)
GFR, Estimated: 29 mL/min — ABNORMAL LOW (ref >60)
Glucose, Bld: 181 mg/dL — ABNORMAL HIGH (ref 70–99)
Potassium: 3.7 mmol/L (ref 3.5–5.1)
Sodium: 142 mmol/L (ref 135–145)

## 2022-08-15 LAB — CBC
HCT: 29.4 % — ABNORMAL LOW (ref 36.0–46.0)
Hemoglobin: 9.8 g/dL — ABNORMAL LOW (ref 12.0–15.0)
MCH: 28 pg (ref 26.0–34.0)
MCHC: 33.3 g/dL (ref 30.0–36.0)
MCV: 84 fL (ref 80.0–100.0)
Platelets: 247 10*3/uL (ref 150–400)
RBC: 3.5 MIL/uL — ABNORMAL LOW (ref 3.87–5.11)
RDW: 15.5 % (ref 11.5–15.5)
WBC: 14.9 10*3/uL — ABNORMAL HIGH (ref 4.0–10.5)
nRBC: 0 % (ref 0.0–0.2)

## 2022-08-15 LAB — GLUCOSE, CAPILLARY
Glucose-Capillary: 176 mg/dL — ABNORMAL HIGH (ref 70–99)
Glucose-Capillary: 183 mg/dL — ABNORMAL HIGH (ref 70–99)
Glucose-Capillary: 163 mg/dL — ABNORMAL HIGH (ref 70–99)
Glucose-Capillary: 146 mg/dL — ABNORMAL HIGH (ref 70–99)

## 2022-08-15 LAB — CK: Total CK: 973 U/L — ABNORMAL HIGH (ref 38–234)

## 2022-08-15 LAB — MAGNESIUM: Magnesium: 1.9 mg/dL (ref 1.7–2.4)

## 2022-08-15 MED ORDER — SODIUM CHLORIDE 0.9 % IV SOLN: INTRAVENOUS | Status: DC

## 2022-08-15 NOTE — Assessment & Plan Note (Signed)
Urinalysis not strongly consistent with UTI.  No urine culture sent In the interim - continue Rocephin.  ID following

## 2022-08-15 NOTE — Assessment & Plan Note (Signed)
Continue IV Rocephin for now.  ID following, unclear source.  [facility] cardiology to perform TEE tomorrow

## 2022-08-15 NOTE — Assessment & Plan Note (Signed)
Unclear source Sepsis criteria includes fever, tachycardia, leukocytosis and AKI.  Lactic acid was normal Continue empiric Rocephin, wound culture from the foot showed no organism, no WBCs.  Repeat blood cultures from 12/3 are negative thus far

## 2022-08-15 NOTE — Assessment & Plan Note (Signed)
Continue bupropion, sertraline and trazodone.

## 2022-08-15 NOTE — Assessment & Plan Note (Addendum)
Patient with severe 10/10 pain left knee on admission.  Seen by Ortho who did aspiration of left knee with fluid consistent with gout Treated with 3 days of oral steroids.  Pain much better controlled now.  Starting allopurinol 50 mg p.o. daily for maintenance regimen for gout -Ortho reevaluation today, pain is much better controlled.  She does have restriction on the range of motion of her knee

## 2022-08-15 NOTE — Assessment & Plan Note (Signed)
Continue insulin Semglee, sliding scale.  Last A1c about 2 months ago was 7.5

## 2022-08-15 NOTE — Consult Note (Signed)
Consultation Note Date: 08/15/2022   Patient Name: Summer [name]  DOB: 11/14/1950  MRN: [id number]  Age / Sex: 71 y.o., female  PCP: Steele Sizer, MD Referring Physician: Max Sane, MD  Reason for Consultation: Establishing goals of care  HPI/Patient Profile: Summer [name] is a 71 y.o. female with medical history significant for Diabetes with polyneuropathy with a chronic left foot wound previously seen at the wound care clinic, OSA, CKD stage IIIb, depression, HTN, HFpEF, class III obesity, OSA and hypothyroidism, who presented to the ED via EMS with frequent falls over the past 3 days, with severe left knee pain.   Clinical Assessment and Goals of Care: Notes and labs reviewed. In to see patient. No family at bedside. She states she was married previously and has a son who is in the [employer] and lives in [place]. She states she has a significant other of 30+ years, Darrell.   She discusses her need for 2 surgeries on her ankle around 6 years ago. She states since then things have declined. 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 She states she uses a walker, and has occasional falls. She states Darrell helps her as much as he can. She states she has had knee pain off and on for a while but did not understand gout.   We discussed her diagnoses, prognosis, GOC, EOL wishes disposition and options.  Created space and opportunity for patient  to explore thoughts and feelings regarding current medical information.   A detailed discussion was had today regarding advanced directives.  Concepts specific to code status, artifical feeding and hydration, IV antibiotics and rehospitalization were discussed.  The difference between an aggressive medical intervention path and a comfort care path was discussed.  Values and goals of care important to patient and family were attempted to be  elicited.  Discussed limitations of medical interventions to prolong quality of life in some situations and discussed the concept of human mortality.  She confirms DNR status. She states she would want all other care. She currently has a living will scanned into ACP tab. She states she is unsure if she would want her son or her significant other to be her surrogate decision maker, but will give this some thought. She is aware that without the documents completed, legal NOK would make decisions if she is unable. She understands at this time, it would be her son.        SUMMARY OF RECOMMENDATIONS   DNR. Continue all other care as needed.   Currently NOK decision maker would be her son. She is considering HPOA papers for her significant other of 30+ years Darrell to be her HPOA. She is aware she can request to speak with chaplain to complete these.        Primary Diagnoses: Present on Admission:  Sepsis ([place])  Obstructive sleep apnea  Major depressive disorder, recurrent episode, moderate ([name])  Adult hypothyroidism  (HFpEF) heart failure with preserved ejection fraction ([place])  Traumatic rhabdomyolysis ([name])  Ulcer of left foot due to type 2 diabetes mellitus ([place])  Acute renal failure superimposed on stage 3b chronic kidney disease ([place])  Acute pain of left knee  Bacteremia due to Streptococcus pneumoniae   I have reviewed the medical record, interviewed the patient and family, and examined the patient. The following aspects are pertinent.  Past Medical History:  Diagnosis Date   Anemia    Arthritis    Asthma    B12 deficiency    Back pain    Carpal tunnel syndrome    Chronic kidney disease    Constipation    Depression    Depressive disorder    Diabetes mellitus    Dyspnea    Fluid retention    Foot pain    GERD (gastroesophageal reflux disease)    Headache    History of hiatal hernia    Hyperlipidemia    Hypertension    IBS (irritable bowel syndrome)    Insomnia     Joint pain    Lumbago    Memory loss    Obesity    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)    Pneumonia    Rhinitis, allergic    Rosacea    Sleep apnea    SOB (shortness of breath)    Thyroid disease    Unspecified hearing loss    Unspecified hereditary and idiopathic peripheral neuropathy    Unspecified sleep apnea    Vitamin D deficiency    Social History   Socioeconomic History   Marital status: Significant Other    Spouse name: Dealer   Number of children: 1   Years of education: Not on file   Highest education level: High school graduate  Occupational History   Occupation: retired  Tobacco Use   Smoking status: Never   Smokeless tobacco: Never  Vaping Use   Vaping Use: Never used  Substance and Sexual Activity   Alcohol use: No    Alcohol/week: 0.0 standard drinks of alcohol   Drug use: No   Sexual activity: Yes    Partners: Male  Other Topics Concern   Not on file  Social History Narrative   Son lives in [place] (retired from [employer]) and her granddaughter (48yr old[place]   Social Determinants of Health   Financial Resource Strain: Medium Risk (08/30/2021)   Overall Financial Resource Strain (CARDIA)    Difficulty of Paying Living Expenses: Somewhat hard  Food Insecurity: Food Insecurity Present (08/12/2022)   Hunger Vital Sign    Worried About Running Out of Food in the Last Year: Sometimes true    Ran Out of Food in the Last Year: Sometimes true  Transportation Needs: No Transportation Needs (08/12/2022)   PRAPARE - THydrologist(Medical): No    Lack of Transportation (Non-Medical): No  Physical Activity: Inactive (08/30/2021)   Exercise Vital Sign    Days of Exercise per Week: 0 days    Minutes of Exercise per Session: 0 min  Stress: No Stress Concern Present (08/30/2021)   [place]   Feeling of Stress : Not at all  Social  Connections: Moderately Isolated (08/30/2021)   Social Connection and Isolation Panel [NHANES]    Frequency of Communication with Friends and Family: More than three times a week    Frequency of Social Gatherings with Friends and Family: Once a week    Attends Religious Services:  Never    Active Member of Clubs or Organizations: No    Attends Archivist Meetings: Never    Marital Status: Living with partner   Family History  Problem Relation Age of Onset   Aneurysm Mother    Aortic aneurysm Mother    Hypertension Mother    Hyperlipidemia Mother    Heart disease Mother    Obesity Mother    Heart attack Maternal Grandfather    Diabetes Maternal Grandfather    Scheduled Meds:  allopurinol  50 mg Oral Daily   amLODipine  5 mg Oral Daily   aspirin EC  81 mg Oral Daily   buPROPion  150 mg Oral Daily   enoxaparin (LOVENOX) injection  0.5 mg/kg Subcutaneous Q24H   ezetimibe  10 mg Oral Daily   icosapent Ethyl  2 g Oral BID   insulin aspart  0-20 Units Subcutaneous TID WC   insulin aspart  0-5 Units Subcutaneous QHS   insulin glargine-yfgn  10 Units Subcutaneous QHS   levothyroxine  25 mcg Oral Once per day on Mon Tue Wed Thu Fri Sat   And   levothyroxine  50 mcg Oral Once per day on Sun   metoprolol tartrate  25 mg Oral BID   modafinil  100 mg Oral Daily   pantoprazole  40 mg Oral Daily   sertraline  150 mg Oral Daily   Continuous Infusions:  sodium chloride 75 mL/hr at 08/15/22 0820   cefTRIAXone (ROCEPHIN)  IV 2 g (08/15/22 1148)   PRN Meds:.acetaminophen **OR** acetaminophen, albuterol, bisacodyl, HYDROcodone-acetaminophen, ondansetron **OR** ondansetron (ZOFRAN) IV, traZODone Medications Prior to Admission:  Prior to Admission medications   Medication Sig Start Date End Date Taking? Authorizing Provider  ACCU-CHEK SOFTCLIX LANCETS lancets  08/26/17  Yes [provider]  Alcohol Swabs (B-D SINGLE USE SWABS REGULAR) PADS  01/04/18  Yes [provider]  amLODipine (NORVASC) 5 MG tablet Take 1 tablet (5 mg total) by mouth daily. 07/04/22  Yes Sowles, Drue Stager, MD  ASPIRIN LOW DOSE 81 MG EC tablet TAKE 1 TABLET (81 MG TOTAL) BY MOUTH DAILY. 05/31/16  Yes Sowles, Drue Stager, MD  Blood Glucose Monitoring Suppl (ACCU-CHEK AVIVA PLUS) w/Device KIT  11/02/15  Yes [provider]  Budeson-Glycopyrrol-Formoterol (BREZTRI AEROSPHERE) 160-9-4.8 MCG/ACT AERO Inhale 2 puffs into the lungs in the morning and at bedtime. 10/19/21  Yes Kasa, Maretta Bees, MD  buPROPion (WELLBUTRIN XL) 150 MG 24 hr tablet Take 1 tablet (150 mg total) by mouth daily. 07/04/22  Yes Sowles, Drue Stager, MD  Cholecalciferol (VITAMIN D3) 25 MCG (1000 UT) CAPS Take 1,000 Units by mouth.   Yes [provider]  Continuous Blood Gluc Sensor (FREESTYLE LIBRE 2 SENSOR) MISC FOR GLUCOSE MONITORING 06/27/22  Yes [provider]  Cyanocobalamin (VITAMIN B12 PO) Take 1 tablet by mouth daily.    Yes [provider]  ezetimibe (ZETIA) 10 MG tablet Take 1 tablet (10 mg total) by mouth daily. 05/22/22  Yes Sowles, Drue Stager, MD  furosemide (LASIX) 20 MG tablet Take 1 tablet (20 mg total) by mouth daily. 07/04/22  Yes Sowles, Drue Stager, MD  insulin NPH-regular Human (70-30) 100 UNIT/ML injection Inject 100 Units into the skin daily with supper.   Yes Lonia Farber, MD  irbesartan-hydrochlorothiazide (AVALIDE) 300-12.5 MG tablet Take 1 tablet by mouth daily. 04/14/22  Yes Sowles, Drue Stager, MD  ketoconazole (NIZORAL) 2 % cream Apply 1 Application topically daily. 07/04/22  Yes Sowles, Drue Stager, MD  levothyroxine (SYNTHROID) 25 MCG tablet Take  1 tablet (25 mcg total) by mouth daily before breakfast. Two on Sunday 07/04/22  Yes Sowles, Drue Stager, MD  metFORMIN (GLUCOPHAGE) 500 MG tablet Take 1 tablet by mouth 2 (two) times daily with a meal.   Yes Lonia Farber, MD  metoprolol tartrate (LOPRESSOR) 25 MG tablet Take 1 tablet (25 mg total) by mouth 2 (two) times daily. 07/04/22  Yes  Sowles, Drue Stager, MD  Misc Natural Products (OSTEO BI-FLEX ADV JOINT SHIELD PO) Take by mouth 2 (two) times daily.    Yes [provider]  modafinil (PROVIGIL) 100 MG tablet Take 1 tablet (100 mg total) by mouth daily. 07/04/22  Yes Sowles, Drue Stager, MD  montelukast (SINGULAIR) 10 MG tablet Take 10 mg by mouth daily. 08/10/22  Yes [provider]  omeprazole (PRILOSEC) 40 MG capsule Take 1 capsule (40 mg total) by mouth daily. 07/04/22  Yes Sowles, Drue Stager, MD  Potassium 99 MG TABS Take by mouth daily at 6 (six) AM.   Yes [provider]  pregabalin (LYRICA) 300 MG capsule Take 300 mg by mouth 2 (two) times a day. 03/06/19  Yes Lonia Farber, MD  Probiotic Product (PROBIOTIC-10 PO) Take by mouth.   Yes [provider]  [place] 1ML/31G 31G X 5/16" 1 ML [place]  12/19/15  Yes [provider]  rosuvastatin (CRESTOR) 40 MG tablet Take 1 tablet (40 mg total) by mouth daily. 04/14/22  Yes Sowles, Drue Stager, MD  sertraline (ZOLOFT) 100 MG tablet Take 1.5 tablets (150 mg total) by mouth daily. 07/04/22  Yes Sowles, Drue Stager, MD  VASCEPA 1 g capsule Take 2 capsules (2 g total) by mouth 2 (two) times daily. 04/04/22  Yes Sowles, Drue Stager, MD  acetaminophen (TYLENOL 8 HOUR) 650 MG CR tablet Take 1 tablet (650 mg total) by mouth every 8 (eight) hours as needed for pain. 10/16/18   Steele Sizer, MD  albuterol (VENTOLIN HFA) 108 (90 Base) MCG/ACT inhaler Inhale 2 puffs into the lungs every 6 (six) hours as needed for wheezing or shortness of breath. 01/05/22   Flora Lipps, MD  fluconazole (DIFLUCAN) 150 MG tablet Take 1 tablet (150 mg total) by mouth every other day. Patient not taking: Reported on 08/10/2022 07/04/22   Steele Sizer, MD  traZODone (DESYREL) 100 MG tablet Take 1 tablet (100 mg total) by mouth at bedtime as needed for sleep. 07/04/22   Steele Sizer, MD   Allergies  Allergen Reactions   Codeine Other (See Comments)    "TRIPPED OUT"   DIDN'T LIKE THE WAY IT FELT   Atorvastatin     muscle pain   Hydrocodone     itching   Tramadol    Latex Rash   Zolpidem Other (See Comments)    Sleep walk   Review of Systems  Musculoskeletal:        Left knee pain    Physical Exam Pulmonary:     Effort: Pulmonary effort is normal.  Neurological:     Mental Status: She is alert.     Vital Signs: BP (!) 156/62 (BP Location: Right Arm)   Pulse 66   Temp (!) 97.5 F (36.4 C)   Resp 20   Ht 5' 2" (1.575 m)   Wt 109.3 kg   SpO2 100%   BMI 44.08 kg/m  Pain Scale: 0-10   Pain Score: 0-No pain   SpO2: SpO2: 100 % O2 Device:SpO2: 100 % O2 Flow Rate: .O2 Flow Rate (L/min): 2 L/min  IO: Intake/output summary:  Intake/Output Summary (Last 24 hours) at 08/15/2022 1327 Last data filed at 08/15/2022 2094 Gross per 24 hour  Intake 1000 ml  Output 900 ml  Net 100 ml    LBM: Last BM Date : 08/15/22 (per patient) Baseline Weight: Weight: 109.3 kg Most recent weight: Weight: 109.3 kg      Signed by: Asencion Gowda, NP   Please contact Palliative Medicine Team phone at [phone] for questions and concerns.  For individual provider: See Shea Evans

## 2022-08-15 NOTE — Assessment & Plan Note (Signed)
Frequent falls CK over 6000-> 3308> 1958> 973 Continue IV hydration  PT, OT recommend SNF.  TOC working on placement

## 2022-08-15 NOTE — Plan of Care (Signed)
  Problem: Education: Goal: Ability to describe self-care measures that may prevent or decrease complications (Diabetes Survival Skills Education) will improve Outcome: Progressing Goal: Individualized Educational Video(s) Outcome: Progressing   Problem: Coping: Goal: Ability to adjust to condition or change in health will improve Outcome: Progressing   Problem: Fluid Volume: Goal: Ability to maintain a balanced intake and output will improve Outcome: Progressing   Problem: Health Behavior/Discharge Planning: Goal: Ability to identify and utilize available resources and services will improve Outcome: Progressing Goal: Ability to manage health-related needs will improve Outcome: Progressing   Problem: Metabolic: Goal: Ability to maintain appropriate glucose levels will improve Outcome: Progressing   Problem: Nutritional: Goal: Maintenance of adequate nutrition will improve Outcome: Progressing Goal: Progress toward achieving an optimal weight will improve Outcome: Progressing   Problem: Skin Integrity: Goal: Risk for impaired skin integrity will decrease Outcome: Progressing   Problem: Tissue Perfusion: Goal: Adequacy of tissue perfusion will improve Outcome: Progressing   Problem: Fluid Volume: Goal: Hemodynamic stability will improve Outcome: Progressing   Problem: Clinical Measurements: Goal: Diagnostic test results will improve Outcome: Progressing Goal: Signs and symptoms of infection will decrease Outcome: Progressing   Problem: Education: Goal: Knowledge of General Education information will improve Description: Including pain rating scale, medication(s)/side effects and non-pharmacologic comfort measures Outcome: Progressing   Problem: Health Behavior/Discharge Planning: Goal: Ability to manage health-related needs will improve Outcome: Progressing   Problem: Clinical Measurements: Goal: Ability to maintain clinical measurements within normal limits  will improve Outcome: Progressing Goal: Will remain free from infection Outcome: Progressing Goal: Diagnostic test results will improve Outcome: Progressing Goal: Respiratory complications will improve Outcome: Progressing Goal: Cardiovascular complication will be avoided Outcome: Progressing   Problem: Activity: Goal: Risk for activity intolerance will decrease Outcome: Progressing   Problem: Nutrition: Goal: Adequate nutrition will be maintained Outcome: Progressing   Problem: Coping: Goal: Level of anxiety will decrease Outcome: Progressing   Problem: Elimination: Goal: Will not experience complications related to bowel motility Outcome: Progressing Goal: Will not experience complications related to urinary retention Outcome: Progressing   Problem: Pain Managment: Goal: General experience of comfort will improve Outcome: Progressing   Problem: Safety: Goal: Ability to remain free from injury will improve Outcome: Progressing   Problem: Skin Integrity: Goal: Risk for impaired skin integrity will decrease Outcome: Progressing   Problem: Clinical Measurements: Goal: Signs and symptoms of infection will decrease Outcome: Progressing

## 2022-08-15 NOTE — Assessment & Plan Note (Signed)
Secondary to sepsis and possibly rhabdo Continue IV hydration Hold metformin.  Avoid nephrotoxins  Lab Results  Component Value Date   CREATININE 1.80 (H) 08/15/2022   CREATININE 1.93 (H) 08/14/2022   CREATININE 1.75 (H) 08/13/2022

## 2022-08-15 NOTE — Progress Notes (Signed)
Shared Decision Making/Informed Consent The risks [esophageal damage, perforation (1:10,000 risk), bleeding, pharyngeal hematoma as well as other potential complications associated with conscious sedation including aspiration, arrhythmia, respiratory failure and death], benefits (treatment guidance and diagnostic support) and alternatives of a transesophageal echocardiogram were discussed in detail with Summer [name] and she is willing to proceed.

## 2022-08-15 NOTE — Progress Notes (Signed)
Patient reevaluated today.  She was started on p.o. prednisone on 08/11/2022 for left knee gout.  She had 3 days of treatment, and prednisone was discontinued due to bacteremia.  She was transition to allopurinol.  She cannot take NSAIDs due to CKD.  Additionally, on the day of aspiration, she did not wish for any further knee injection.  Left knee joint aspiration culture results have remained negative.  Patient feels that her left knee is improving, and it is nowhere near as painful as it was at the time of her aspiration.  She still does note that she does not have normal function in her left knee.  She has been able to bear weight with physical therapy, but this is painful and she is not motivated to mobilize currently.  On my exam today, she does have range of motion from 0-45 degrees that is relatively painless, but does have increased pain with attempted flexion beyond 45 degrees.  She has no significant tenderness to touch about the knee joint.  There does not appear to be a large joint effusion.  There is no dramatic increase in warmth compared to the other side.  There is no notable erythema.  Overall, this is significantly improved compared to prior exam on 08/11/2022 where she had minimal range of motion before being in severe pain as well as having a joint effusion and severe tenderness to palpation about the knee.  We discussed the various treatment options including continued medical management, corticosteroid injection, and arthroscopic irrigation and debridement of the left knee. Given her current clinical improvement and setting of bacteremia, we agreed against surgical intervention as well as corticosteroid injection.  We will plan to continue medical management and monitor her symptoms.  Above findings were discussed with the patient and current treatment team.  I will plan to follow peripherally.  Please contact me or on-call [facility] clinic Orthopedic surgeon with any further  questions.

## 2022-08-15 NOTE — Progress Notes (Signed)
Progress Note   Patient: Summer [name] BPZ:[id number] DOB: May 08, 1951 DOA: 08/10/2022     5 DOS: the patient was seen and examined on 08/15/2022   Brief hospital course:  Summer [name] is a 71 y.o. female with medical history significant for Diabetes with polyneuropathy with a chronic left foot wound previously seen at the wound care clinic, OSA, CKD stage IIIb, depression, HTN, HFpEF, class III obesity, OSA and hypothyroidism,Who presents to the ED via EMS with frequent falls over the past 3 days, now with severe left knee pain.  Upon arriving the hospital, temperature was 101.1, HR 117, creatinine 1.89.  CK level 6435.  UA abnormal, x-ray showed mild to moderate left knee joint effusion. Patient was started on broad spectrum antibiotics for possible sepsis.  Seen by orthopedics, knee tap results support diagnosis of gout.    12/1: Podiatry recommends conservative management with dressing changes and weightbearing as tolerated with surgical shoe.  Left knee aspiration by Ortho the fluid suggestive of gout.  Started on steroids 12/2: PT, OT eval recommends SNF 12/3: Waiting for SNF placement, TOC aware, CK improving.  ID consult for strep pneumo bacteremia 12/4: Not motivated to work with therapy.  Palliative care consult, 2D echo and 2 sets of blood culture per ID, starting allopurinol for gout 12/5: Ortho reeval, TEE, stopped steroids  Assessment and Plan: * Bacteremia due to Streptococcus pneumoniae Continue IV Rocephin for now.  ID following, unclear source.  [facility] cardiology to perform TEE tomorrow  Urinary tract infection Urinalysis not strongly consistent with UTI.  No urine culture sent In the interim - continue Rocephin.  ID following  Traumatic rhabdomyolysis ([place]) Frequent falls CK over 6000-> 3308> 1958> 973 Continue IV hydration  PT, OT recommend SNF.  TOC working on placement  Sepsis [facility]) Unclear source Sepsis criteria includes fever, tachycardia, leukocytosis and AKI.   Lactic acid was normal Continue empiric Rocephin, wound culture from the foot showed no organism, no WBCs.  Repeat blood cultures from 12/3 are negative thus far  Acute pain of left knee Patient with severe 10/10 pain left knee on admission.  Seen by Ortho who did aspiration of left knee with fluid consistent with gout Treated with 3 days of oral steroids.  Pain much better controlled now.  Starting allopurinol 50 mg p.o. daily for maintenance regimen for gout -Ortho reevaluation today, pain is much better controlled.  She does have restriction on the range of motion of her knee  Ulcer of left foot due to type 2 diabetes mellitus ([name]) Left foot/great toe ulceration without underlying osteomyelitis Seen by podiatry.  Thought to be due to hallux malleus contracture leading to excessive stress to the distal tip of the hallux and therefore ulceration.  Continue Betadine wet-to-dry dressing, weightbearing as tolerated with surgical shoe.  No surgery plans per podiatry.  Outpatient podiatry follow-up with Dr. Amalia Hailey in 1 week post discharge Continue IV Rocephin for now.  Uncontrolled type 2 diabetes mellitus with hyperglycemia, with long-term current use of insulin ([name]) Continue insulin Semglee, sliding scale.  Last A1c about 2 months ago was 7.5  Acute renal failure superimposed on stage 3b chronic kidney disease ([place]) Secondary to sepsis and possibly rhabdo Continue IV hydration Hold metformin.  Avoid nephrotoxins  Lab Results  Component Value Date   CREATININE 1.80 (H) 08/15/2022   CREATININE 1.93 (H) 08/14/2022   CREATININE 1.75 (H) 08/13/2022     Major depressive disorder, recurrent episode, moderate ([name]) Continue bupropion, sertraline and trazodone.  (  HFpEF) heart failure with preserved ejection fraction ([name]) Clinically euvolemic to dry continue metoprolol.  Will hold furosemide due to AKI.  Morbid obesity with BMI of 40.0-44.9, adult ([place]) Complicating factor to overall  prognosis and care.  Palliative care consult  Adult hypothyroidism Continue levothyroxine.  Obstructive sleep apnea Continue CPAP.        Subjective: Patient still not quite motivated to do any therapy.    Physical Exam: Vitals:   08/14/22 2024 08/15/22 0428 08/15/22 0550 08/15/22 0755  BP: (!) 171/67 (!) 164/69 (!) 151/71 (!) 156/62  Pulse: (!) 59 71 64 66  Resp: '18 18 18 20  '$ Temp: 98.2 F (36.8 C) 98 F (36.7 C)  (!) 97.5 F (36.4 C)  TempSrc: Oral Oral    SpO2: 94% 100%  100%  Weight:      Height:       71 year old female lying in the bed comfortably without any acute distress Lungs clear to auscultation bilaterally Cardiovascular regular rate and rhythm Abdomen soft, benign Extremities: No tenderness of the left knee.  She does have range of motion limitation Neuro alert and awake, nonfocal Data Reviewed:  CK 973  Family Communication: None  Disposition: Status is: Inpatient Remains inpatient appropriate because: Management of strep pneumo bacteremia  Planned Discharge Destination: Skilled nursing facility   DVT prophylaxis-Lovenox Time spent: 35 minutes  Author: Max Sane, MD 08/15/2022 2:14 PM  For on call review [email or web address].

## 2022-08-15 NOTE — Assessment & Plan Note (Signed)
Complicating factor to overall prognosis and care.  Palliative care consult

## 2022-08-15 NOTE — Progress Notes (Signed)
OT Cancellation Note  Patient Details Name: Summer [name] MRN: [id number] DOB: 01/06/1951   Cancelled Treatment:    Reason Eval/Treat Not Completed: Patient declined, no reason specified. Attempting to see pt for tx session. Pt endorsing feeling anxious stating she was just informed of a procedure she is having in the morning and "it is too much for me to handle right now." Unwilling to try even sitting EOB attempts. Pt declining to work with therapy at this time despite max encouragement and education on importance of mobility for recovery. Requesting OT come back tomorrow after her procedure. Will re-attempt as able.   Doneta Public 08/15/2022, 4:09 PM

## 2022-08-15 NOTE — Assessment & Plan Note (Signed)
Left foot/great toe ulceration without underlying osteomyelitis Seen by podiatry.  Thought to be due to hallux malleus contracture leading to excessive stress to the distal tip of the hallux and therefore ulceration.  Continue Betadine wet-to-dry dressing, weightbearing as tolerated with surgical shoe.  No surgery plans per podiatry.  Outpatient podiatry follow-up with Dr. Amalia Hailey in 1 week post discharge Continue IV Rocephin for now.

## 2022-08-15 NOTE — TOC Progression Note (Signed)
Transition of Care [facility]) - Progression Note    Patient Details  Name: Summer [name] MRN: [id number] Date of Birth: [birth year]  Transition of Care [facility]) CM/SW [place], [place] Phone Number: 08/15/2022, 5:18 PM  Clinical Narrative:     Referrals sent to SNF. Fl2 completed.  Expected Discharge Plan: [place] Barriers to Discharge: Continued Medical Work up  Expected Discharge Plan and Services Expected Discharge Plan: [place] In-house Referral: Clinical Social Work   Post Acute Care Choice: [place] Living arrangements for the past 2 months: Single Family Home                                       Social Determinants of Health (SDOH) Interventions    Readmission Risk Interventions     No data to display

## 2022-08-15 NOTE — Progress Notes (Signed)
ID Pt has left knee pain, not participating in PT No fever O/e awake, > BMI Patient Vitals for the past 24 hrs:  BP Temp Temp src Pulse Resp SpO2  08/15/22 1637 (!) 172/59 (!) 97.5 F (36.4 C) -- 64 20 100 %  08/15/22 0755 (!) 156/62 (!) 97.5 F (36.4 C) -- 66 20 100 %  08/15/22 0550 (!) 151/71 -- -- 64 18 --  08/15/22 0428 (!) 164/69 98 F (36.7 C) Oral 71 18 100 %    Chest b/l air entry Hs s1s2 Abd soft Left knee swollen Left foot- lateral ulcer and great toe callus/ulcer   Labs    Latest Ref Rng & Units 08/15/2022    5:34 AM 08/14/2022    4:00 AM 08/13/2022    4:58 AM  CBC  WBC 4.0 - 10.5 K/uL 14.9  15.2  14.5   Hemoglobin 12.0 - 15.0 g/dL 9.8  9.8  10.0   Hematocrit 36.0 - 46.0 % 29.4  29.8  30.4   Platelets 150 - 400 K/uL 247  244  216        Latest Ref Rng & Units 08/15/2022    5:34 AM 08/14/2022    4:00 AM 08/13/2022    4:58 AM  CMP  Glucose 70 - 99 mg/dL 181  184  176   BUN 8 - 23 mg/dL 46  38  36   Creatinine 0.44 - 1.00 mg/dL 1.80  1.93  1.75   Sodium 135 - 145 mmol/L 142  141  140   Potassium 3.5 - 5.1 mmol/L 3.7  3.4  3.6   Chloride 98 - 111 mmol/L 105  104  103   CO2 22 - 32 mmol/L '24  23  23   '$ Calcium 8.9 - 10.3 mg/dL 9.4  9.6  9.6     Micro 11/30 BC Strep pneumo 12/1 BC- NG Left foot wound- Ng so far  Impression/recommendation   ?71 yr female presenting with multiple falls And Left knee pain or the past 3 daysl   Strep pneumo bacteremia Source currently not clear- could be from the left foot wound Concern for left knee secondary infection Underwent aspiration of the knee after a dose of ceftriaxone and vanco- Monosodium urate crystals in the fluid with cell count of > 25K- culture neg so far She is on prednisone as per ortho for acute gout - received for 3 days- there was preliminary improvement in the pain, but now not so much She could have infection and gout together- Would not dismiss infection because of neg synovial fluid culture as the  culture was sent after she received antibiotic May not need I/D currently -observe She needs TEE to r/o endocarditis  wound culture so far neg Continue ceftriaxone- duration will depend on above tests   Diabetes mellitus with neuropathy, charcot foot and wound left foot X 2 The left lateral foot wound appears to be infected, where as the great toie wound looks chronic   Rhabdomyolysis- due to fall   AKI   Charcot foot- H/o left Ankle surgey- has hardware       DM  Discussed the management with care team

## 2022-08-15 NOTE — Assessment & Plan Note (Signed)
Continue levothyroxine 

## 2022-08-15 NOTE — Assessment & Plan Note (Signed)
Continue CPAP.  

## 2022-08-16 ENCOUNTER — Inpatient Hospital Stay (HOSPITAL_COMMUNITY)
Admit: 2022-08-16 | Discharge: 2022-08-16 | Disposition: A | Discharge status: Home / Self Care | Payer: No Typology Code available for payment source | Attending: Cardiology | Admitting: Cardiology

## 2022-08-16 ENCOUNTER — Encounter
Admission: EM | Disposition: A | Discharge status: Skilled Nursing Facility | Payer: Self-pay | Source: Home / Self Care | Attending: Obstetrics and Gynecology

## 2022-08-16 ENCOUNTER — Other Ambulatory Visit: Payer: Self-pay | Admitting: Family Medicine

## 2022-08-16 DIAGNOSIS — M109 Gout, unspecified: Secondary | ICD-10-CM | POA: Diagnosis not present

## 2022-08-16 DIAGNOSIS — M00262 Other streptococcal arthritis, left knee: Secondary | ICD-10-CM

## 2022-08-16 DIAGNOSIS — I6783 Posterior reversible encephalopathy syndrome: Secondary | ICD-10-CM | POA: Diagnosis not present

## 2022-08-16 DIAGNOSIS — R7881 Bacteremia: Secondary | ICD-10-CM | POA: Diagnosis not present

## 2022-08-16 DIAGNOSIS — E08621 Diabetes mellitus due to underlying condition with foot ulcer: Secondary | ICD-10-CM | POA: Diagnosis not present

## 2022-08-16 DIAGNOSIS — B953 Streptococcus pneumoniae as the cause of diseases classified elsewhere: Secondary | ICD-10-CM | POA: Diagnosis not present

## 2022-08-16 DIAGNOSIS — Z7984 Long term (current) use of oral hypoglycemic drugs: Secondary | ICD-10-CM | POA: Diagnosis not present

## 2022-08-16 DIAGNOSIS — Z794 Long term (current) use of insulin: Secondary | ICD-10-CM | POA: Diagnosis not present

## 2022-08-16 DIAGNOSIS — M25562 Pain in left knee: Secondary | ICD-10-CM | POA: Diagnosis not present

## 2022-08-16 DIAGNOSIS — L97421 Non-pressure chronic ulcer of left heel and midfoot limited to breakdown of skin: Secondary | ICD-10-CM | POA: Diagnosis not present

## 2022-08-16 DIAGNOSIS — T796XXA Traumatic ischemia of muscle, initial encounter: Secondary | ICD-10-CM | POA: Diagnosis not present

## 2022-08-16 HISTORY — PX: TEE WITHOUT CARDIOVERSION: SHX5443

## 2022-08-16 LAB — CBC
HCT: 32.4 % — ABNORMAL LOW (ref 36.0–46.0)
Hemoglobin: 10.6 g/dL — ABNORMAL LOW (ref 12.0–15.0)
MCH: 27.4 pg (ref 26.0–34.0)
MCHC: 32.7 g/dL (ref 30.0–36.0)
MCV: 83.7 fL (ref 80.0–100.0)
Platelets: 260 10*3/uL (ref 150–400)
RBC: 3.87 MIL/uL (ref 3.87–5.11)
RDW: 15.7 % — ABNORMAL HIGH (ref 11.5–15.5)
WBC: 15 10*3/uL — ABNORMAL HIGH (ref 4.0–10.5)
nRBC: 0 % (ref 0.0–0.2)

## 2022-08-16 LAB — GLUCOSE, CAPILLARY
Glucose-Capillary: 175 mg/dL — ABNORMAL HIGH (ref 70–99)
Glucose-Capillary: 163 mg/dL — ABNORMAL HIGH (ref 70–99)
Glucose-Capillary: 210 mg/dL — ABNORMAL HIGH (ref 70–99)
Glucose-Capillary: 179 mg/dL — ABNORMAL HIGH (ref 70–99)

## 2022-08-16 LAB — BASIC METABOLIC PANEL
Anion gap: 8 (ref 5–15)
BUN: 44 mg/dL — ABNORMAL HIGH (ref 8–23)
CO2: 22 mmol/L (ref 22–32)
Calcium: 8.6 mg/dL — ABNORMAL LOW (ref 8.9–10.3)
Chloride: 107 mmol/L (ref 98–111)
Creatinine, Ser: 1.71 mg/dL — ABNORMAL HIGH (ref 0.44–1.00)
GFR, Estimated: 31 mL/min — ABNORMAL LOW (ref >60)
Glucose, Bld: 160 mg/dL — ABNORMAL HIGH (ref 70–99)
Potassium: 3.4 mmol/L — ABNORMAL LOW (ref 3.5–5.1)
Sodium: 137 mmol/L (ref 135–145)

## 2022-08-16 LAB — MAGNESIUM: Magnesium: 1.7 mg/dL (ref 1.7–2.4)

## 2022-08-16 SURGERY — ECHOCARDIOGRAM, TRANSESOPHAGEAL
Anesthesia: Moderate Sedation

## 2022-08-16 MED ORDER — FENTANYL CITRATE (PF) 100 MCG/2ML IJ SOLN
INTRAMUSCULAR | Status: AC | PRN
  Administered 2022-08-16: 25 ug via INTRAVENOUS

## 2022-08-16 MED ORDER — LORAZEPAM 2 MG/ML IJ SOLN
INTRAMUSCULAR | Status: AC | PRN
  Administered 2022-08-16: 1 mg via INTRAVENOUS

## 2022-08-16 MED ORDER — FENTANYL CITRATE (PF) 100 MCG/2ML IJ SOLN
INTRAMUSCULAR | Status: AC
  Filled 2022-08-16: qty 2

## 2022-08-16 MED ORDER — MIDAZOLAM HCL 2 MG/2ML IJ SOLN
INTRAMUSCULAR | Status: AC
  Filled 2022-08-16: qty 4

## 2022-08-16 MED ORDER — LIDOCAINE VISCOUS HCL 2 % MT SOLN
OROMUCOSAL | Status: AC
  Administered 2022-08-16: 15 mL
  Filled 2022-08-16: qty 15

## 2022-08-16 NOTE — Progress Notes (Signed)
ID Pt has left knee pain, not participating in PT No fever   O/e awake, > BMI Patient Vitals for the past 24 hrs:  BP Temp Temp src Pulse Resp SpO2  08/16/22 1145 (!) 142/56 -- -- 61 20 97 %  08/16/22 1130 (!) 140/54 -- -- 64 15 97 %  08/16/22 1120 -- -- -- 68 (!) 23 100 %  08/16/22 1115 (!) 156/64 -- -- 61 17 94 %  08/16/22 1035 (!) 151/72 97.9 F (36.6 C) Oral 65 19 98 %  08/16/22 0900 (!) 158/74 98.6 F (37 C) Oral 72 17 100 %  08/16/22 0533 (!) 164/63 98.7 F (37.1 C) Oral 65 18 100 %  08/15/22 2054 (!) 148/80 99.1 F (37.3 C) Oral 81 19 99 %  08/15/22 1637 (!) 172/59 (!) 97.5 F (36.4 C) -- 64 20 100 %    Chest b/l air entry Hs s1s2 Abd soft Left knee swollen Restricted movt because of pain Left foot- lateral ulcer and great toe callus/ulcer   Labs    Latest Ref Rng & Units 08/16/2022    5:10 AM 08/15/2022    5:34 AM 08/14/2022    4:00 AM  CBC  WBC 4.0 - 10.5 K/uL 15.0  14.9  15.2   Hemoglobin 12.0 - 15.0 g/dL 10.6  9.8  9.8   Hematocrit 36.0 - 46.0 % 32.4  29.4  29.8   Platelets 150 - 400 K/uL 260  247  244        Latest Ref Rng & Units 08/16/2022    5:10 AM 08/15/2022    5:34 AM 08/14/2022    4:00 AM  CMP  Glucose 70 - 99 mg/dL 160  181  184   BUN 8 - 23 mg/dL 44  46  38   Creatinine 0.44 - 1.00 mg/dL 1.71  1.80  1.93   Sodium 135 - 145 mmol/L 137  142  141   Potassium 3.5 - 5.1 mmol/L 3.4  3.7  3.4   Chloride 98 - 111 mmol/L 107  105  104   CO2 22 - 32 mmol/L '22  24  23   '$ Calcium 8.9 - 10.3 mg/dL 8.6  9.4  9.6     Micro 11/30 BC Strep pneumo 12/1 BC- NG Left foot wound- Ng so far  Impression/recommendation   ?71 yr female presenting with multiple falls And Left knee pain or the past 3 daysl   Strep pneumo bacteremia Source currently not clear- could be from the left foot wound Concern for left knee secondary infection Underwent aspiration of the knee after a dose of ceftriaxone and vanco- Monosodium urate crystals in the fluid with cell count  of > 25K- culture neg so far She is on prednisone as per ortho for acute gout - received for 3 days- there was preliminary improvement in the pain, but now not so much She could have infection and gout together- Would not dismiss infection because of neg synovial fluid culture as the culture was sent after she received antibiotic May not need I/D currently -observe  TEE negative for  endocarditis  wound culture so far neg Continue ceftriaxone- duration will depend on above tests   Diabetes mellitus with neuropathy, charcot foot and wound left foot X 2 The left lateral foot wound appears to be infected, where as the great toie wound looks chronic, but culture has been negative   Rhabdomyolysis- due to fall- improved   AKI   Charcot foot- H/o left  Ankle surgey- has hardware       DM  Discussed the management with patient and  care team

## 2022-08-16 NOTE — Progress Notes (Signed)
Transesophageal Echocardiogram :  Indication: bacteremia Requesting/ordering  physician:   Procedure: 2m of viscous lidocaine were given orally to provide local anesthesia to the oropharynx. The patient was positioned supine on the left side, bite block provided. The patient was moderately sedated with the doses of versed and fentanyl as detailed below.  Using digital technique an omniplane probe was advanced into the esophagus without incident.   Moderate sedation: 1. Sedation used:  Versed: '2mg'$ , Fentanyl: 532m 2. Time administered:  11:11am   Time when patient started recovery: 11:21 3. I was face to face during this time 10 mins  See report in EPIC  for complete details: In brief, imaging revealed normal LV function with no RWMAs and no mural apical thrombus.  .  Estimated ejection fraction was 60%.  Right sided cardiac chambers were normal with no evidence of pulmonary hypertension.  There is no evidence for endocarditis  Imaging of the septum showed no ASD or VSD 2D and color flow confirmed no PFO   The descending thoracic aorta had no  mural aortic debris with no evidence of aneurysmal dilation or disection  Study quality limited by patient's pain and respiratory distress.   BrAaron Edelmangbor-Etang 08/16/2022 11:25 AM

## 2022-08-16 NOTE — Progress Notes (Signed)
Occupational Therapy Treatment Patient Details Name: Summer [name] MRN: [id number] DOB: 1951/08/18 Today's Date: 08/16/2022   History of present illness 71 y.o. female with medical history significant for Diabetes with polyneuropathy with a chronic left foot wound previously seen at the wound care clinic, OSA, CKD stage IIIb, depression, HTN, HFpEF, class III obesity, OSA and hypothyroidism,Who presents to the ED via EMS with frequent falls over the past 3 days, now with severe left knee pain.  L knee tap reveals gout.   OT comments  OT/PT co-treatment completed to maximize safety and participation. Upon entering session, pt resting in bed and verbalized unwillingness to participate in therapy. Pt required Total A +2 for supine to sit. Once sitting EOB, pt required Max A +1 to maintain static sitting balance which improved to supervision. Pt engaged in grooming tasks with set up A and Max VC for initiation. She required Max A +2 to return to supine as well as +2 to scoot up toward [facility]. PT/OT attempting to assist with rolling in order to straighten out linens, however, pt refusing. MD present and attempting to encourage participation with therapy. Pt left as received with all needs in reach. D/C recommendation remains appropriate. OT will continue to follow acutely.     Recommendations for follow up therapy are one component of a multi-disciplinary discharge planning process, led by the attending physician.  Recommendations may be updated based on patient status, additional functional criteria and insurance authorization.    Follow Up Recommendations  Skilled nursing-short term rehab (<3 hours/day)     Assistance Recommended at Discharge Frequent or constant Supervision/Assistance  Patient can return home with the following  A lot of help with walking and/or transfers;A lot of help with bathing/dressing/bathroom   Equipment Recommendations  Other (comment) (defer to next venue of care)     Recommendations for Other Services      Precautions / Restrictions Precautions Precautions: Fall Restrictions Weight Bearing Restrictions: Yes LLE Weight Bearing: Weight bearing as tolerated Other Position/Activity Restrictions: L post op shoe       Mobility Bed Mobility Overal bed mobility: Needs Assistance Bed Mobility: Supine to Sit, Sit to Supine     Supine to sit: Total assist, +2 for physical assistance Sit to supine: Max assist, +2 for physical assistance   General bed mobility comments: pt able to move bilateral LE but unwilling to initiate movement    Transfers                         Balance Overall balance assessment: Needs assistance Sitting-balance support: Feet supported, Bilateral upper extremity supported Sitting balance-Leahy Scale: Fair Sitting balance - Comments: Max A +1 initially for static sitting balance, improving to supervision                                   ADL either performed or assessed with clinical judgement   ADL Overall ADL's : Needs assistance/impaired     Grooming: Set up;Sitting Grooming Details (indicate cue type and reason): max VC to initiate                                    Extremity/Trunk Assessment Upper Extremity Assessment Upper Extremity Assessment: Generalized weakness   Lower Extremity Assessment Lower Extremity Assessment: Generalized weakness  Vision Patient Visual Report: No change from baseline     Perception     Praxis      Cognition Arousal/Alertness: Awake/alert Behavior During Therapy: Agitated Overall Cognitive Status: No family/caregiver present to determine baseline cognitive functioning                                 General Comments: Pt verbally agreessive and stating "you are torturing me", therapeutic listening provided, attempted to reassure pt that PT/OTs goal is to ensure she gets better. Pt verbalized confusion about  rehab and D/C goals, attempting to explain purpose/importance of rehab before D/C home due to current level of assistance required.        Exercises      Shoulder Instructions       General Comments apperent cognitive issues effecting ability to appropriately interact with PT/OT    Pertinent Vitals/ Pain       Pain Assessment Pain Assessment: Faces Faces Pain Scale: Hurts little more Pain Location: pt unable to identify source of pain Pain Descriptors / Indicators: Discomfort, Grimacing, Guarding, Aching Pain Intervention(s): Limited activity within patient's tolerance, Monitored during session, Repositioned  Home Living                                          Prior Functioning/Environment              Frequency  Min 2X/week        Progress Toward Goals  OT Goals(current goals can now be found in the care plan section)  Progress towards OT goals: Progressing toward goals  Acute Rehab OT Goals Patient Stated Goal: go home OT Goal Formulation: With patient Time For Goal Achievement: 08/26/22 Potential to Achieve Goals: [place] Discharge plan remains appropriate;Frequency remains appropriate    Co-evaluation    PT/OT/SLP Co-Evaluation/Treatment: Yes Reason for Co-Treatment: Necessary to address cognition/behavior during functional activity;For patient/therapist safety;To address functional/ADL transfers PT goals addressed during session: Mobility/safety with mobility;Balance OT goals addressed during session: ADL's and self-care      AM-PAC OT "6 Clicks" Daily Activity     Outcome Measure   Help from another person eating meals?: A Little Help from another person taking care of personal grooming?: A Little Help from another person toileting, which includes using toliet, bedpan, or urinal?: A Lot Help from another person bathing (including washing, rinsing, drying)?: A Lot Help from another person to put on and taking off regular  upper body clothing?: A Lot Help from another person to put on and taking off regular lower body clothing?: A Lot 6 Click Score: 14    End of Session    OT Visit Diagnosis: Unsteadiness on feet (R26.81);History of falling (Z91.81);Muscle weakness (generalized) (M62.81)   Activity Tolerance Patient limited by pain;Treatment limited secondary to agitation   Patient Left in bed;with call bell/phone within reach;with bed alarm set   Nurse Communication Mobility status        Time: 3335-4562 OT Time Calculation (min): 26 min  Charges: OT General Charges $OT Visit: 1 Visit OT Treatments $Self Care/Home Management : 8-22 mins  [facility] MS, OTR/L ascom [phone]  08/16/22, 4:32 PM

## 2022-08-16 NOTE — Progress Notes (Signed)
*  PRELIMINARY RESULTS* Echocardiogram Echocardiogram Transesophageal has been performed.  [name] 08/16/2022, 11:37 AM

## 2022-08-16 NOTE — Plan of Care (Signed)
In to check on patient, but patient is out of the room and off unit at this time.  PMT will shadow for needs.

## 2022-08-16 NOTE — Progress Notes (Signed)
Physical Therapy Treatment Patient Details Name: Summer [name] MRN: [id number] DOB: [birth year] Today's Date: 08/16/2022   History of Present Illness 71 y.o. female with medical history significant for Diabetes with polyneuropathy with a chronic left foot wound previously seen at the wound care clinic, OSA, CKD stage IIIb, depression, HTN, HFpEF, class III obesity, OSA and hypothyroidism,Who presents to the ED via EMS with frequent falls over the past 3 days, now with severe left knee pain.  L knee tap reveals gout.    PT Comments    Pt found supine in bed upon PT/OT co-treat. Pt vocalized unwillingness to participate in mobility. Supine<>Sit total assist x2 that progressed to max assist x2 when returning to bed. Pt required max assist x1 to maintain sitting balance that progressed to supervision. Pt verbally aggressive throughout session and expressed she would not participate in mobility session. Pt heavily educated on mobility importance and pt verbalized understanding. Pt would benefit from skilled physical therapy to address the listed deficits (see below) to increase independence with ADLs and function. Current recommendation is SNF to return pt to PLOF.      Recommendations for follow up therapy are one component of a multi-disciplinary discharge planning process, led by the attending physician.  Recommendations may be updated based on patient status, additional functional criteria and insurance authorization.  Follow Up Recommendations  Skilled nursing-short term rehab (<3 hours/day) Can patient physically be transported by private vehicle: No   Assistance Recommended at Discharge Frequent or constant Supervision/Assistance  Patient can return home with the following Two people to help with walking and/or transfers;Two people to help with bathing/dressing/bathroom;Assistance with cooking/housework;Help with stairs or ramp for entrance;Assist for transportation   Equipment Recommendations   None recommended by PT    Recommendations for Other Services       Precautions / Restrictions Restrictions Weight Bearing Restrictions: Yes LLE Weight Bearing: Weight bearing as tolerated Other Position/Activity Restrictions: L post op shoe     Mobility  Bed Mobility Overal bed mobility: Needs Assistance Bed Mobility: Supine to Sit     Supine to sit: Total assist, +2 for physical assistance Sit to supine: Max assist, +2 for physical assistance   General bed mobility comments: pt able to move bilateral LE but unwilling to initiate movement    Transfers                        Ambulation/Gait                   Stairs             Wheelchair Mobility    Modified Rankin (Stroke Patients Only)       Balance Overall balance assessment: Needs assistance Sitting-balance support: Feet supported, Bilateral upper extremity supported Sitting balance-Leahy Scale: Fair                                      Cognition Arousal/Alertness: Awake/alert Behavior During Therapy: Agitated Overall Cognitive Status: No family/caregiver present to determine baseline cognitive functioning                                 General Comments: pt unable to verbalize where her pain is and verbalized confusion about her D/C goals        Exercises  General Comments General comments (skin integrity, edema, etc.): apperent cognitive issues effecting ability to appropriately interact with PT/OT      Pertinent Vitals/Pain Pain Assessment Pain Assessment: Faces Faces Pain Scale: Hurts little more Pain Location: pt unable to identify source of pain Pain Descriptors / Indicators: Discomfort, Grimacing, Guarding, Aching Pain Intervention(s): Limited activity within patient's tolerance, Monitored during session, Repositioned    Home Living                          Prior Function            PT Goals (current goals can  now be found in the care plan section) Acute Rehab PT Goals Patient Stated Goal: to rest PT Goal Formulation: With patient Time For Goal Achievement: 08/25/22 Potential to Achieve Goals: Fair Progress towards PT goals: Progressing toward goals    Frequency    Min 2X/week      PT Plan Current plan remains appropriate    Co-evaluation PT/OT/SLP Co-Evaluation/Treatment: Yes Reason for Co-Treatment: Necessary to address cognition/behavior during functional activity;For patient/therapist safety;To address functional/ADL transfers PT goals addressed during session: Mobility/safety with mobility;Balance OT goals addressed during session: ADL's and self-care;Proper use of Adaptive equipment and DME      AM-PAC PT "6 Clicks" Mobility   Outcome Measure  Help needed turning from your back to your side while in a flat bed without using bedrails?: Total Help needed moving from lying on your back to sitting on the side of a flat bed without using bedrails?: Total Help needed moving to and from a bed to a chair (including a wheelchair)?: Total Help needed standing up from a chair using your arms (e.g., wheelchair or bedside chair)?: Total Help needed to walk in hospital room?: Total Help needed climbing 3-5 steps with a railing? : Total 6 Click Score: 6    End of Session   Activity Tolerance: Other (comment) (pt refused further mobility) Patient left: with bed alarm set;with call bell/phone within reach;in bed   PT Visit Diagnosis: Muscle weakness (generalized) (M62.81);Difficulty in walking, not elsewhere classified (R26.2);Pain Pain - Right/Left: Left Pain - part of body:  (pt unable to localize)     Time: 0867-6195 PT Time Calculation (min) (ACUTE ONLY): 24 min  Charges:                        Claiborne Billings O'Daniel, SPT  08/16/2022, 4:00 PM

## 2022-08-16 NOTE — Plan of Care (Signed)
  Problem: Education: Goal: Ability to describe self-care measures that may prevent or decrease complications (Diabetes Survival Skills Education) will improve Outcome: Progressing Goal: Individualized Educational Video(s) Outcome: Progressing   Problem: Coping: Goal: Ability to adjust to condition or change in health will improve Outcome: Progressing   Problem: Fluid Volume: Goal: Ability to maintain a balanced intake and output will improve Outcome: Progressing   Problem: Health Behavior/Discharge Planning: Goal: Ability to identify and utilize available resources and services will improve Outcome: Progressing Goal: Ability to manage health-related needs will improve Outcome: Progressing   Problem: Metabolic: Goal: Ability to maintain appropriate glucose levels will improve Outcome: Progressing   Problem: Nutritional: Goal: Maintenance of adequate nutrition will improve Outcome: Progressing Goal: Progress toward achieving an optimal weight will improve Outcome: Progressing   Problem: Skin Integrity: Goal: Risk for impaired skin integrity will decrease Outcome: Progressing   Problem: Tissue Perfusion: Goal: Adequacy of tissue perfusion will improve Outcome: Progressing   Problem: Fluid Volume: Goal: Hemodynamic stability will improve Outcome: Progressing   Problem: Clinical Measurements: Goal: Diagnostic test results will improve Outcome: Progressing Goal: Signs and symptoms of infection will decrease Outcome: Progressing   Problem: Education: Goal: Knowledge of General Education information will improve Description: Including pain rating scale, medication(s)/side effects and non-pharmacologic comfort measures Outcome: Progressing   Problem: Health Behavior/Discharge Planning: Goal: Ability to manage health-related needs will improve Outcome: Progressing   Problem: Clinical Measurements: Goal: Ability to maintain clinical measurements within normal limits  will improve Outcome: Progressing Goal: Will remain free from infection Outcome: Progressing Goal: Diagnostic test results will improve Outcome: Progressing Goal: Respiratory complications will improve Outcome: Progressing Goal: Cardiovascular complication will be avoided Outcome: Progressing   Problem: Activity: Goal: Risk for activity intolerance will decrease Outcome: Progressing   Problem: Nutrition: Goal: Adequate nutrition will be maintained Outcome: Progressing   Problem: Coping: Goal: Level of anxiety will decrease Outcome: Progressing   Problem: Elimination: Goal: Will not experience complications related to bowel motility Outcome: Progressing Goal: Will not experience complications related to urinary retention Outcome: Progressing   Problem: Pain Managment: Goal: General experience of comfort will improve Outcome: Progressing   Problem: Safety: Goal: Ability to remain free from injury will improve Outcome: Progressing   Problem: Skin Integrity: Goal: Risk for impaired skin integrity will decrease Outcome: Progressing   Problem: Clinical Measurements: Goal: Signs and symptoms of infection will decrease Outcome: Progressing

## 2022-08-16 NOTE — Progress Notes (Signed)
*  PRELIMINARY RESULTS* Echocardiogram 2D Echocardiogram has been performed.  [name] 08/16/2022, 11:33 AM

## 2022-08-16 NOTE — Care Management Important Message (Signed)
Important Message  Patient Details  Name: ZAKIAH GAUTHREAUX MRN: [id number] Date of Birth: 10-Sep-1951   Medicare Important Message Given:  Yes  Patient out of room upon time of visit, no family in room.  Copy of Medicare IM left on beside tray for reference.   Dannette Barbara 08/16/2022, 12:26 PM

## 2022-08-16 NOTE — Progress Notes (Signed)
Progress Note   Patient: Summer [name] ITG:[id number] DOB: [birth year] DOA: 08/10/2022     6 DOS: the patient was seen and examined on 08/16/2022   Brief hospital course: Summer [name] is a 71 y.o. female with medical history significant for Diabetes with polyneuropathy with a chronic left foot wound previously seen at the wound care clinic, OSA, CKD stage IIIb, depression, HTN, HFpEF, class III obesity, OSA and hypothyroidism,Who presents to the ED via EMS with frequent falls over the past 3 days, now with severe left knee pain.  Upon arriving the hospital, temperature was 101.1, HR 117, creatinine 1.89.  CK level 6435.  UA abnormal, x-ray showed mild to moderate left knee joint effusion. Patient was started on broad spectrum antibiotics for possible sepsis.  Seen by orthopedics, knee tap results support diagnosis of gout.     12/1: Podiatry recommends conservative management with dressing changes and weightbearing as tolerated with surgical shoe.  Left knee aspiration by Ortho the fluid suggestive of gout.  Started on steroids 12/2: PT, OT eval recommends SNF 12/3: Waiting for SNF placement, TOC aware, CK improving.  ID consult for strep pneumo bacteremia 12/4: Not motivated to work with therapy.  Palliative care consult, 2D echo and 2 sets of blood culture per ID, starting allopurinol for gout 12/5: Ortho reeval, TEE, stopped steroids 12/6: TEE completed, negative  Assessment and Plan: * Bacteremia due to Streptococcus pneumoniae Continue IV Rocephin for now.  ID following, unclear source. TEE today negative  Traumatic rhabdomyolysis ([name]) Frequent falls CK over 6000-> 3308> 1958> 973 Continue IV hydration  PT, OT recommend SNF.  TOC working on placement  Sepsis ([place]) 2/2 bacteremia Sepsis criteria includes fever, tachycardia, leukocytosis and AKI.  Lactic acid was normal Continue empiric Rocephin, wound culture from the foot showed no organism, no WBCs.  Repeat blood cultures from 12/3  are negative thus far  Acute pain of left knee Patient with severe 10/10 pain left knee on admission.  Seen by Ortho who did aspiration of left knee with fluid consistent with gout Treated with 3 days of oral steroids.  Pain much better controlled now.  started allopurinol 50 mg p.o. daily for maintenance regimen for gout   Ulcer of left foot due to type 2 diabetes mellitus ([place]) Left foot/great toe ulceration without underlying osteomyelitis Seen by podiatry.  Thought to be due to hallux malleus contracture leading to excessive stress to the distal tip of the hallux and therefore ulceration.  Continue Betadine wet-to-dry dressing, weightbearing as tolerated with surgical shoe.  No surgery plans per podiatry.  Outpatient podiatry follow-up with Dr. Amalia Hailey in 1 week post discharge Continue IV Rocephin for now.  Uncontrolled type 2 diabetes mellitus with hyperglycemia, with long-term current use of insulin ([name]) Continue insulin Semglee, sliding scale.  Last A1c about 2 months ago was 7.5  Acute renal failure superimposed on stage 3b chronic kidney disease ([place]) Secondary to sepsis and possibly rhabdo Continue IV hydration Hold metformin.  Avoid nephrotoxins  Lab Results  Component Value Date   CREATININE 1.80 (H) 08/15/2022   CREATININE 1.93 (H) 08/14/2022   CREATININE 1.75 (H) 08/13/2022     Major depressive disorder, recurrent episode, moderate ([name]) Continue bupropion, sertraline and trazodone.  (HFpEF) heart failure with preserved ejection fraction ([name]) Clinically euvolemic to dry continue metoprolol.  Will hold furosemide due to AKI.  Morbid obesity with BMI of 40.0-44.9, adult ([place]) Complicating factor to overall prognosis and care.  Palliative care consult  Adult hypothyroidism  Continue levothyroxine.  Obstructive sleep apnea Continue CPAP.  Mucosal bleeding Lips are scabbed. Eyes and inside of mouth normal, denies genital involvement. Severe chapped lips? - monitor  closely        Subjective: feeling fatigued    Physical Exam: Vitals:   08/16/22 1145 08/16/22 1200 08/16/22 1215 08/16/22 1242  BP: (!) 142/56 (!) 148/59 (!) 144/61 (!) 131/56  Pulse: 61 70 69 67  Resp: '20 18 17 17  '$ Temp:    97.8 F (36.6 C)  TempSrc:      SpO2: 97% 96% 97% 97%  Weight:      Height:       71 year old female lying in the bed, chyronically ill appearing Lungs clear to auscultation bilaterally Cardiovascular regular rate and rhythm Scabs on lips, interior of mouth appears normal Abdomen soft, benign Extremities: No tenderness of the left knee.  No effusion of right knee Neuro alert and awake, nonfocal Data Reviewed:    Family Communication: None  Disposition: Status is: Inpatient Remains inpatient appropriate because: Management of strep pneumo bacteremia  Planned Discharge Destination: Skilled nursing facility   DVT prophylaxis-Lovenox   Author: Desma Maxim, MD 08/16/2022 3:50 PM  For on call review [email or web address].

## 2022-08-17 ENCOUNTER — Encounter: Payer: Self-pay | Admitting: Cardiology

## 2022-08-17 DIAGNOSIS — B953 Streptococcus pneumoniae as the cause of diseases classified elsewhere: Secondary | ICD-10-CM | POA: Diagnosis not present

## 2022-08-17 DIAGNOSIS — R7881 Bacteremia: Secondary | ICD-10-CM | POA: Diagnosis not present

## 2022-08-17 DIAGNOSIS — Z7189 Other specified counseling: Secondary | ICD-10-CM | POA: Diagnosis not present

## 2022-08-17 LAB — GLUCOSE, CAPILLARY
Glucose-Capillary: 173 mg/dL — ABNORMAL HIGH (ref 70–99)
Glucose-Capillary: 169 mg/dL — ABNORMAL HIGH (ref 70–99)
Glucose-Capillary: 152 mg/dL — ABNORMAL HIGH (ref 70–99)
Glucose-Capillary: 124 mg/dL — ABNORMAL HIGH (ref 70–99)

## 2022-08-17 LAB — CBC
HCT: 32.4 % — ABNORMAL LOW (ref 36.0–46.0)
Hemoglobin: 10.6 g/dL — ABNORMAL LOW (ref 12.0–15.0)
MCH: 27.6 pg (ref 26.0–34.0)
MCHC: 32.7 g/dL (ref 30.0–36.0)
MCV: 84.4 fL (ref 80.0–100.0)
Platelets: 273 10*3/uL (ref 150–400)
RBC: 3.84 MIL/uL — ABNORMAL LOW (ref 3.87–5.11)
RDW: 16 % — ABNORMAL HIGH (ref 11.5–15.5)
WBC: 16 10*3/uL — ABNORMAL HIGH (ref 4.0–10.5)
nRBC: 0 % (ref 0.0–0.2)

## 2022-08-17 LAB — BASIC METABOLIC PANEL
Anion gap: 10 (ref 5–15)
BUN: 41 mg/dL — ABNORMAL HIGH (ref 8–23)
CO2: 22 mmol/L (ref 22–32)
Calcium: 8.7 mg/dL — ABNORMAL LOW (ref 8.9–10.3)
Chloride: 107 mmol/L (ref 98–111)
Creatinine, Ser: 1.69 mg/dL — ABNORMAL HIGH (ref 0.44–1.00)
GFR, Estimated: 32 mL/min — ABNORMAL LOW (ref >60)
Glucose, Bld: 182 mg/dL — ABNORMAL HIGH (ref 70–99)
Potassium: 3.4 mmol/L — ABNORMAL LOW (ref 3.5–5.1)
Sodium: 139 mmol/L (ref 135–145)

## 2022-08-17 LAB — MAGNESIUM: Magnesium: 1.7 mg/dL (ref 1.7–2.4)

## 2022-08-17 MED ORDER — MAGNESIUM SULFATE 2 GM/50ML IV SOLN
2.0000 g | Freq: Once | INTRAVENOUS | Status: AC
  Administered 2022-08-17: 2 g via INTRAVENOUS
  Filled 2022-08-17: qty 50

## 2022-08-17 MED ORDER — SODIUM CHLORIDE 0.9 % IV SOLN
2.0000 g | INTRAVENOUS | Status: DC
  Administered 2022-08-17 – 2022-08-20 (×4): 2 g via INTRAVENOUS
  Filled 2022-08-17 (×2): qty 20
  Filled 2022-08-17: qty 2
  Filled 2022-08-17: qty 20

## 2022-08-17 MED ORDER — HYDROMORPHONE HCL 1 MG/ML IJ SOLN
1.0000 mg | INTRAMUSCULAR | Status: DC | PRN
  Administered 2022-08-17: 1 mg via INTRAVENOUS
  Administered 2022-08-17 – 2022-08-22 (×13): 2 mg via INTRAVENOUS
  Filled 2022-08-17 (×14): qty 2

## 2022-08-17 NOTE — Progress Notes (Signed)
Daily Progress Note   Patient Name: Summer [name]       Date: 08/17/2022 DOB: 1951-05-12  Age: 71 y.o. MRN#: [id number] Attending Physician: Gwynne Edinger, MD Primary Care Physician: Steele Sizer, MD Admit Date: 08/10/2022  Reason for Consultation/Follow-up: Establishing goals of care  Subjective: Notes and labs reviewed. In to see patient to follow up on decision for surrogate decision maker, son or significant other. Patient is complaining of 10/10 left knee pain.   Patient complaining of 10/10 knee pain. Per orders, patient has PRN pain medication available, both oral and IV. Per MAR, last dose of Norco was 04:51 this morning. 1 mg of IV Dilaudid provided at 11:11 this morning. Would recommend regular use of Norco as this will help minimize peaks and troughs. Would continue IV Dilaudid for rescue.   Recommend consulting spiritual care if patient would like to complete HPOA forms.     Length of Stay: 7  Current Medications: Scheduled Meds:  . allopurinol  50 mg Oral Daily  . amLODipine  5 mg Oral Daily  . aspirin EC  81 mg Oral Daily  . buPROPion  150 mg Oral Daily  . enoxaparin (LOVENOX) injection  0.5 mg/kg Subcutaneous Q24H  . ezetimibe  10 mg Oral Daily  . icosapent Ethyl  2 g Oral BID  . insulin aspart  0-20 Units Subcutaneous TID WC  . insulin aspart  0-5 Units Subcutaneous QHS  . insulin glargine-yfgn  10 Units Subcutaneous QHS  . levothyroxine  25 mcg Oral Once per day on Mon Tue Wed Thu Fri Sat   And  . levothyroxine  50 mcg Oral Once per day on Sun  . metoprolol tartrate  25 mg Oral BID  . modafinil  100 mg Oral Daily  . pantoprazole  40 mg Oral Daily  . sertraline  150 mg Oral Daily    Continuous Infusions: . sodium chloride 20 mL/hr at 08/16/22 1557  .  cefTRIAXone (ROCEPHIN)  IV 2 g (08/17/22 1115)    PRN Meds: acetaminophen **OR** acetaminophen, albuterol, bisacodyl, HYDROcodone-acetaminophen, HYDROmorphone (DILAUDID) injection, ondansetron **OR** ondansetron (ZOFRAN) IV, traZODone  Physical Exam Pulmonary:     Effort: Pulmonary effort is normal.  Neurological:     Mental Status: She is alert.  Vital Signs: BP (!) 161/67 (BP Location: Right Arm)   Pulse 75   Temp 97.9 F (36.6 C)   Resp 16   Ht '5\' 2"'$  (1.575 m)   Wt 109.3 kg   SpO2 100%   BMI 44.08 kg/m  SpO2: SpO2: 100 % O2 Device: O2 Device: Room Air O2 Flow Rate: O2 Flow Rate (L/min): 2 L/min  Intake/output summary:  Intake/Output Summary (Last 24 hours) at 08/17/2022 1412 Last data filed at 08/16/2022 2124 Gross per 24 hour  Intake 956.58 ml  Output 700 ml  Net 256.58 ml   LBM: Last BM Date : 08/15/22 Baseline Weight: Weight: 109.3 kg Most recent weight: Weight: 109.3 kg    Patient Active Problem List   Diagnosis Date Noted  . Multiple falls 08/15/2022  . Gouty arthritis 08/15/2022  . Pyogenic arthritis of left knee joint ([place]) 08/15/2022  . Diabetic ulcer of left midfoot associated with diabetes mellitus due to underlying condition, limited to breakdown of skin ([place]) 08/15/2022  . Bacteremia due to Streptococcus pneumoniae 08/12/2022  . Acute pain of left knee 08/11/2022  . Metabolic acidosis 62/11/5595  . Sepsis ([place]) 08/10/2022  . Traumatic rhabdomyolysis ([place]) 08/10/2022  . Urinary tract infection 08/10/2022  . Ulcer of left foot due to type 2 diabetes mellitus ([place]) 08/10/2022  . Acute renal failure superimposed on stage 3b chronic kidney disease ([place]) 08/10/2022  . Major depression in remission ([place]) 07/04/2022  . Atherosclerosis of aorta ([place]) 07/04/2022  . Insomnia due to other mental disorder 07/04/2022  . Amputated toe of left foot ([place]) 07/04/2022  . Chronic obstructive pulmonary disease ([place]) 01/28/2021  . Diabetes mellitus ([place])  05/24/2020  . B12 deficiency 05/24/2020  . Vitamin D deficiency 05/24/2020  . Metatarsalgia of left foot 01/15/2020  . Major depressive disorder, recurrent episode, moderate ([place]) 04/04/2019  . Left arm weakness 04/03/2019  . (HFpEF) heart failure with preserved ejection fraction ([place]) 08/31/2017  . Moderate persistent asthma 08/31/2017  . Hyperlipidemia associated with type 2 diabetes mellitus ([place]) 06/14/2017  . Uncontrolled type 2 diabetes mellitus with hyperglycemia, with long-term current use of insulin ([place]) 08/18/2016  . Charcot foot due to diabetes mellitus ([place]) 02/22/2016  . Acquired abduction deformity of foot 07/12/2015  . Osteoarthritis of subtalar joint 07/12/2015  . Arthritis of foot, degenerative 07/12/2015  . Carpal tunnel syndrome 04/17/2015  . Chronic constipation 04/17/2015  . Insomnia, persistent 04/17/2015  . Stage 3b chronic kidney disease ([place]) 04/17/2015  . Decreased exercise tolerance 04/17/2015  . Diabetes mellitus with polyneuropathy ([place]) 04/17/2015  . Gastroesophageal reflux disease without esophagitis 04/17/2015  . Bursitis, trochanteric 04/17/2015  . Cephalalgia 04/17/2015  . Benign hypertension 04/17/2015  . Adult hypothyroidism 04/17/2015  . Hearing loss 04/17/2015  . Chronic recurrent major depressive disorder ([place]) 04/17/2015  . Neurogenic claudication 04/17/2015  . Morbid obesity with BMI of 40.0-44.9, adult ([place]) 04/17/2015  . Hypo-ovarianism 04/17/2015  . Perennial allergic rhinitis with seasonal variation 04/17/2015  . Acne erythematosa 04/17/2015  . Dyskinesia, tardive 04/17/2015  . Memory loss 04/17/2015  . Impingement syndrome of shoulder 04/17/2015  . Dermatitis, stasis 04/17/2015  . Obstructive sleep apnea 05/14/2014  . Shortness of breath on exertion 05/06/2014  . Mixed hyperlipidemia 02/06/2012  . LBP (low back pain) 09/16/2008    Palliative Care Assessment & Plan    Recommendations/Plan: Patient complaining of 10/10 knee  pain. Per orders, patient has PRN pain medication available, both oral and IV. Per MAR, last dose of Norco was 04:51 this morning.  1 mg of IV Dilaudid provided at 11:11 this morning. Would recommend regular use of Norco as this will help minimize peaks and troughs. Would continue IV Dilaudid for rescue.   Discussed pain with primary RN and charge RN  Recommend consulting spiritual care if patient would like to complete HPOA forms.     Code Status:    Code Status Orders  (From admission, onward)           Start     Ordered   08/11/22 0024  Do not attempt resuscitation (DNR)  Continuous       Question Answer Comment  In the event of cardiac or respiratory ARREST Do not call a "code blue"   In the event of cardiac or respiratory ARREST Do not perform Intubation, CPR, defibrillation or ACLS   In the event of cardiac or respiratory ARREST Use medication by any route, position, wound care, and other measures to relive pain and suffering. May use oxygen, suction and manual treatment of airway obstruction as needed for comfort.      08/11/22 0023           Code Status History     Date Active Date Inactive Code Status Order ID Comments User Context   08/11/2022 0002 08/11/2022 0023 Full Code [id number]  Athena Masse, MD ED   11/23/2020 1127 11/23/2020 1902 Full Code [id number]  Delana Meyer, Dolores Lory, MD Inpatient   04/04/2019 0914 04/04/2019 1806 DNR [id number]  Bettey Costa, MD Inpatient   04/02/2019 1858 04/04/2019 0914 Full Code [id number]  Lang Snow, NP ED      Advance Directive Documentation    Flowsheet Row Most Recent Value  Type of Advance Directive Healthcare Power of Attorney, Living will  Pre-existing out of facility DNR order (yellow form or pink MOST form) --  "MOST" Form in Place? --        Thank you for allowing the Palliative Medicine Team to assist in the care of this patient.   Asencion Gowda, NP  Please contact Palliative Medicine Team phone at  [phone] for questions and concerns.

## 2022-08-17 NOTE — Plan of Care (Signed)
  Problem: Education: Goal: Ability to describe self-care measures that may prevent or decrease complications (Diabetes Survival Skills Education) will improve Outcome: Progressing Goal: Individualized Educational Video(s) Outcome: Progressing   Problem: Coping: Goal: Ability to adjust to condition or change in health will improve Outcome: Progressing   Problem: Fluid Volume: Goal: Ability to maintain a balanced intake and output will improve Outcome: Progressing   Problem: Health Behavior/Discharge Planning: Goal: Ability to identify and utilize available resources and services will improve Outcome: Progressing Goal: Ability to manage health-related needs will improve Outcome: Progressing   Problem: Metabolic: Goal: Ability to maintain appropriate glucose levels will improve Outcome: Progressing   Problem: Nutritional: Goal: Maintenance of adequate nutrition will improve Outcome: Progressing Goal: Progress toward achieving an optimal weight will improve Outcome: Progressing   Problem: Skin Integrity: Goal: Risk for impaired skin integrity will decrease Outcome: Progressing   Problem: Tissue Perfusion: Goal: Adequacy of tissue perfusion will improve Outcome: Progressing   Problem: Fluid Volume: Goal: Hemodynamic stability will improve Outcome: Progressing   Problem: Clinical Measurements: Goal: Diagnostic test results will improve Outcome: Progressing Goal: Signs and symptoms of infection will decrease Outcome: Progressing   Problem: Education: Goal: Knowledge of General Education information will improve Description: Including pain rating scale, medication(s)/side effects and non-pharmacologic comfort measures Outcome: Progressing   Problem: Health Behavior/Discharge Planning: Goal: Ability to manage health-related needs will improve Outcome: Progressing   Problem: Clinical Measurements: Goal: Ability to maintain clinical measurements within normal limits  will improve Outcome: Progressing Goal: Will remain free from infection Outcome: Progressing Goal: Diagnostic test results will improve Outcome: Progressing Goal: Respiratory complications will improve Outcome: Progressing Goal: Cardiovascular complication will be avoided Outcome: Progressing   Problem: Activity: Goal: Risk for activity intolerance will decrease Outcome: Progressing   Problem: Nutrition: Goal: Adequate nutrition will be maintained Outcome: Progressing   Problem: Coping: Goal: Level of anxiety will decrease Outcome: Progressing   Problem: Elimination: Goal: Will not experience complications related to bowel motility Outcome: Progressing Goal: Will not experience complications related to urinary retention Outcome: Progressing   Problem: Pain Managment: Goal: General experience of comfort will improve Outcome: Progressing   Problem: Safety: Goal: Ability to remain free from injury will improve Outcome: Progressing   Problem: Skin Integrity: Goal: Risk for impaired skin integrity will decrease Outcome: Progressing   Problem: Clinical Measurements: Goal: Signs and symptoms of infection will decrease Outcome: Progressing

## 2022-08-17 NOTE — Progress Notes (Signed)
Progress Note   Patient: Summer [name] WUX:[id number] DOB: 30-Aug-1951 DOA: 08/10/2022     7 DOS: the patient was seen and examined on 08/17/2022   Brief hospital course: BRENT NOTO is a 71 y.o. female with medical history significant for Diabetes with polyneuropathy with a chronic left foot wound previously seen at the wound care clinic, OSA, CKD stage IIIb, depression, HTN, HFpEF, class III obesity, OSA and hypothyroidism,Who presents to the ED via EMS with frequent falls over the past 3 days, now with severe left knee pain.  Upon arriving the hospital, temperature was 101.1, HR 117, creatinine 1.89.  CK level 6435.  UA abnormal, x-ray showed mild to moderate left knee joint effusion. Patient was started on broad spectrum antibiotics for possible sepsis.  Seen by orthopedics, knee tap results support diagnosis of gout.     12/1: Podiatry recommends conservative management with dressing changes and weightbearing as tolerated with surgical shoe.  Left knee aspiration by Ortho the fluid suggestive of gout.  Started on steroids 12/2: PT, OT eval recommends SNF 12/3: Waiting for SNF placement, TOC aware, CK improving.  ID consult for strep pneumo bacteremia 12/4: Not motivated to work with therapy.  Palliative care consult, 2D echo and 2 sets of blood culture per ID, starting allopurinol for gout 12/5: Ortho reeval, TEE, stopped steroids 12/6: TEE completed, negative  Assessment and Plan: * Bacteremia due to Streptococcus pneumoniae Continue IV Rocephin for now.  ID following, unclear source. TEE negative  Traumatic rhabdomyolysis ([name]) Frequent falls CK over 6000-> 3308> 1958> 973 Now off IVF PT, OT recommend SNF.  TOC working on placement  Sepsis ([place]) 2/2 bacteremia Sepsis criteria includes fever, tachycardia, leukocytosis and AKI.  Lactic acid was normal Continue empiric Rocephin, wound culture from the foot showed no organism, no WBCs.  Repeat blood cultures from 12/3 are negative thus  far  Acute pain of left knee Patient with severe 10/10 pain left knee on admission.  Seen by Ortho who did aspiration of left knee with fluid consistent with gout Treated with 3 days of oral steroids.  Pain much better controlled now.  started allopurinol 50 mg p.o. daily for maintenance regimen for gout  Ulcer of left foot due to type 2 diabetes mellitus ([place]) Left foot/great toe ulceration without underlying osteomyelitis Seen by podiatry.  Thought to be due to hallux malleus contracture leading to excessive stress to the distal tip of the hallux and therefore ulceration.  Continue Betadine wet-to-dry dressing, weightbearing as tolerated with surgical shoe.  No surgery plans per podiatry.  Outpatient podiatry follow-up with Dr. Amalia Hailey in 1 week post discharge Continue IV Rocephin for now.  Uncontrolled type 2 diabetes mellitus with hyperglycemia, with long-term current use of insulin ([name]) Continue insulin Semglee, sliding scale.  Last A1c about 2 months ago was 7.5  Acute renal failure superimposed on stage 3b chronic kidney disease ([place]) Secondary to sepsis and possibly rhabdo Now off fluids and hgb stable Hold metformin.  Avoid nephrotoxins  Major depressive disorder, recurrent episode, moderate ([name]) Continue bupropion, sertraline and trazodone.  (HFpEF) heart failure with preserved ejection fraction ([name]) Clinically euvolemic to dry continue metoprolol.  Will hold furosemide due to AKI.  Morbid obesity with BMI of 40.0-44.9, adult ([place]) Complicating factor to overall prognosis and care.  Palliative care consult  Adult hypothyroidism Continue levothyroxine.  Obstructive sleep apnea Continue CPAP.  Mucosal bleeding Lips are scabbed. Eyes and inside of mouth normal, denies genital involvement. Severe chapped lips? - monitor  closely        Subjective: feeling fatigued, knee pain improved w/ prn  Physical Exam: Vitals:   08/16/22 2120 08/17/22 0404 08/17/22 0735  08/17/22 1601  BP: (!) 173/73 (!) 170/71 (!) 161/67 (!) 146/75  Pulse: 72 71 75 73  Resp: '18 15 16 16  '$ Temp: (!) 97.5 F (36.4 C) 99 F (37.2 C) 97.9 F (36.6 C) 98.4 F (36.9 C)  TempSrc: Oral Oral    SpO2: 95% 99% 100% 95%  Weight:      Height:       71 year old female lying in the bed, chyronically ill appearing Lungs clear to auscultation bilaterally Cardiovascular regular rate and rhythm Scabs on lips, interior of mouth appears normal though dentition poor Abdomen soft, benign Extremities: No tenderness of the left knee.  No effusion of right knee. Left foot bandaged Neuro alert and awake, nonfocal Data Reviewed:    Family Communication: sig o darrel updated telephonically 12/7  Disposition: Status is: Inpatient Remains inpatient appropriate because: Management of strep pneumo bacteremia  Planned Discharge Destination: Skilled nursing facility   DVT prophylaxis-Lovenox   Author: Desma Maxim, MD 08/17/2022 4:06 PM  For on call review [email or web address].

## 2022-08-17 NOTE — TOC Progression Note (Signed)
Transition of Care [facility]) - Progression Note    Patient Details  Name: Summer [name] MRN: [id number] Date of Birth: 12/27/1950  Transition of Care [facility]) CM/SW [place], [place] Phone Number: 08/17/2022, 4:34 PM  Clinical Narrative:     Patient has been accepted to [facility] in [place], [place]. Patient has [employer].  Expected Discharge Plan: [place] Barriers to Discharge: Continued Medical Work up  Expected Discharge Plan and Services Expected Discharge Plan: [place] In-house Referral: Clinical Social Work   Post Acute Care Choice: [place] Living arrangements for the past 2 months: Single Family Home                                       Social Determinants of Health (SDOH) Interventions    Readmission Risk Interventions     No data to display

## 2022-08-17 NOTE — Progress Notes (Incomplete)
ID Pt has left knee pain, not participating in PT No fever   O/e awake, > BMI Patient Vitals for the past 24 hrs:  BP Temp Temp src Pulse Resp SpO2  08/17/22 0735 (!) 161/67 97.9 F (36.6 C) -- 75 16 100 %  08/17/22 0404 (!) 170/71 99 F (37.2 C) Oral 71 15 99 %  08/16/22 2120 (!) 173/73 (!) 97.5 F (36.4 C) Oral 72 18 95 %  08/16/22 1604 (!) 154/55 98.7 F (37.1 C) -- 67 19 100 %    Chest b/l air entry Hs s1s2 Abd soft Left knee swollen Restricted movt because of pain Left foot- lateral ulcer and great toe callus/ulcer   Labs    Latest Ref Rng & Units 08/17/2022    5:30 AM 08/16/2022    5:10 AM 08/15/2022    5:34 AM  CBC  WBC 4.0 - 10.5 K/uL 16.0  15.0  14.9   Hemoglobin 12.0 - 15.0 g/dL 10.6  10.6  9.8   Hematocrit 36.0 - 46.0 % 32.4  32.4  29.4   Platelets 150 - 400 K/uL 273  260  247        Latest Ref Rng & Units 08/17/2022    5:30 AM 08/16/2022    5:10 AM 08/15/2022    5:34 AM  CMP  Glucose 70 - 99 mg/dL 182  160  181   BUN 8 - 23 mg/dL 41  44  46   Creatinine 0.44 - 1.00 mg/dL 1.69  1.71  1.80   Sodium 135 - 145 mmol/L 139  137  142   Potassium 3.5 - 5.1 mmol/L 3.4  3.4  3.7   Chloride 98 - 111 mmol/L 107  107  105   CO2 22 - 32 mmol/L '22  22  24   '$ Calcium 8.9 - 10.3 mg/dL 8.7  8.6  9.4     Micro 11/30 BC Strep pneumo 12/1 BC- NG Left foot wound- Ng so far  Impression/recommendation   ?71 yr female presenting with multiple falls And Left knee pain or the past 3 daysl   Strep pneumo bacteremia Source currently not clear- could be from the left foot wound Concern for left knee secondary infection Underwent aspiration of the knee after a dose of ceftriaxone and vanco- Monosodium urate crystals in the fluid with cell count of > 25K- culture neg so far She is on prednisone as per ortho for acute gout - received for 3 days- there was preliminary improvement in the pain, but now not so much She could have infection and gout together- Would not dismiss  infection because of neg synovial fluid culture as the culture was sent after she received antibiotic May not need I/D currently -observe  TEE negative for  endocarditis  wound culture so far neg Continue ceftriaxone- duration will depend on above tests   Diabetes mellitus with neuropathy, charcot foot and wound left foot X 2 The left lateral foot wound appears to be infected, where as the great toie wound looks chronic, but culture has been negative   Rhabdomyolysis- due to fall- improved   AKI   Charcot foot- H/o left Ankle surgey- has hardware       DM  Discussed the management with patient and  care team

## 2022-08-18 DIAGNOSIS — M109 Gout, unspecified: Secondary | ICD-10-CM | POA: Diagnosis not present

## 2022-08-18 DIAGNOSIS — R7881 Bacteremia: Secondary | ICD-10-CM | POA: Diagnosis not present

## 2022-08-18 DIAGNOSIS — B953 Streptococcus pneumoniae as the cause of diseases classified elsewhere: Secondary | ICD-10-CM | POA: Diagnosis not present

## 2022-08-18 DIAGNOSIS — M25462 Effusion, left knee: Secondary | ICD-10-CM | POA: Diagnosis not present

## 2022-08-18 DIAGNOSIS — T796XXA Traumatic ischemia of muscle, initial encounter: Secondary | ICD-10-CM | POA: Diagnosis not present

## 2022-08-18 DIAGNOSIS — M25562 Pain in left knee: Secondary | ICD-10-CM | POA: Diagnosis not present

## 2022-08-18 DIAGNOSIS — M1712 Unilateral primary osteoarthritis, left knee: Secondary | ICD-10-CM | POA: Diagnosis not present

## 2022-08-18 DIAGNOSIS — Z7189 Other specified counseling: Secondary | ICD-10-CM | POA: Diagnosis not present

## 2022-08-18 LAB — AEROBIC CULTURE W GRAM STAIN (SUPERFICIAL SPECIMEN): Gram Stain: NONE SEEN

## 2022-08-18 LAB — GLUCOSE, CAPILLARY
Glucose-Capillary: 136 mg/dL — ABNORMAL HIGH (ref 70–99)
Glucose-Capillary: 149 mg/dL — ABNORMAL HIGH (ref 70–99)
Glucose-Capillary: 165 mg/dL — ABNORMAL HIGH (ref 70–99)
Glucose-Capillary: 233 mg/dL — ABNORMAL HIGH (ref 70–99)

## 2022-08-18 LAB — CULTURE, BLOOD (ROUTINE X 2)
Culture: NO GROWTH
Culture: NO GROWTH
Special Requests: ADEQUATE
Special Requests: ADEQUATE

## 2022-08-18 LAB — BODY FLUID CULTURE W GRAM STAIN: Culture: NO GROWTH

## 2022-08-18 MED ORDER — BUPIVACAINE HCL (PF) 0.5 % IJ SOLN
10.0000 mL | Freq: Once | INTRAMUSCULAR | Status: DC
  Filled 2022-08-18: qty 10

## 2022-08-18 MED ORDER — AMLODIPINE BESYLATE 5 MG PO TABS
5.0000 mg | ORAL_TABLET | Freq: Once | ORAL | Status: AC
  Administered 2022-08-18: 5 mg via ORAL
  Filled 2022-08-18: qty 1

## 2022-08-18 MED ORDER — AMLODIPINE BESYLATE 10 MG PO TABS
10.0000 mg | ORAL_TABLET | Freq: Every day | ORAL | Status: DC
  Administered 2022-08-19 – 2022-08-28 (×9): 10 mg via ORAL
  Filled 2022-08-18 (×10): qty 1

## 2022-08-18 MED ORDER — TRIAMCINOLONE ACETONIDE 40 MG/ML IJ SUSP
80.0000 mg | Freq: Once | INTRAMUSCULAR | Status: DC
  Filled 2022-08-18: qty 2

## 2022-08-18 NOTE — Progress Notes (Signed)
Occupational Therapy Treatment Patient Details Name: Summer [name] MRN: [id number] DOB: [birth year] Today's Date: 08/18/2022   History of present illness 71 y.o. female with medical history significant for Diabetes with polyneuropathy with a chronic left foot wound previously seen at the wound care clinic, OSA, CKD stage IIIb, depression, HTN, HFpEF, class III obesity, OSA and hypothyroidism,Who presents to the ED via EMS with frequent falls over the past 3 days, now with severe left knee pain.  L knee tap reveals gout.   OT comments  Pt reluctant to participate in therapy this day, saying her leg is too painful to move. With encouragement, she agrees to rolling in bed, to engaging in UE therex in supine, and to changing hospital gown, all of which she is able to perform with Min-Mod A. Pt reports 10/10 L knee pain with movement and w/ light touch. Although pt appears angry and upset throughout session, as therapist is leaving pt thanks her profusely, saying how much she appreciated the session. Therapist informed pt that next session PT and OT will come together if possible and that 2 therapists + pt will attempt to move pt from bed to chair. Pt states she will do "the best I can."   Recommendations for follow up therapy are one component of a multi-disciplinary discharge planning process, led by the attending physician.  Recommendations may be updated based on patient status, additional functional criteria and insurance authorization.    Follow Up Recommendations  Skilled nursing-short term rehab (<3 hours/day)     Assistance Recommended at Discharge Frequent or constant Supervision/Assistance  Patient can return home with the following  A lot of help with walking and/or transfers;A lot of help with bathing/dressing/bathroom;Assistance with cooking/housework;Help with stairs or ramp for entrance   Equipment Recommendations       Recommendations for Other Services      Precautions /  Restrictions Precautions Precautions: Fall Restrictions Weight Bearing Restrictions: Yes LLE Weight Bearing: Weight bearing as tolerated Other Position/Activity Restrictions: L post op shoe       Mobility Bed Mobility Overal bed mobility: Needs Assistance Bed Mobility: Rolling Rolling: Mod assist         General bed mobility comments: pt unwilling to move b/l LE    Transfers                   General transfer comment: Pt refuses     Balance Overall balance assessment: Needs assistance   Sitting balance-Leahy Scale: Zero Sitting balance - Comments: Pt unable to come into EOB sitting position     Standing balance-Leahy Scale: Zero                             ADL either performed or assessed with clinical judgement   ADL Overall ADL's : Needs assistance/impaired                 Upper Body Dressing : Set up;Minimal assistance                     General ADL Comments: Max A for LB dressing, anticipate Max A for OOB fxl mobility    Extremity/Trunk Assessment Upper Extremity Assessment Upper Extremity Assessment: Generalized weakness   Lower Extremity Assessment Lower Extremity Assessment: Generalized weakness LLE: Unable to fully assess due to pain        Vision       Perception     Praxis  Cognition Arousal/Alertness: Awake/alert Behavior During Therapy: WFL for tasks assessed/performed Overall Cognitive Status: No family/caregiver present to determine baseline cognitive functioning                         Following Commands: Follows one step commands with increased time                Exercises Other Exercises Other Exercises: Educ re: importance of OOB movement, participating in therapy sessions. Therapeutic listening. Educ re: non-pharmacological pain mgmt techniques    Shoulder Instructions       General Comments      Pertinent Vitals/ Pain       Pain Assessment Pain Assessment:  0-10 Pain Score: 10-Worst pain ever Pain Intervention(s): Limited activity within patient's tolerance, Repositioned, Relaxation  Home Living                                          Prior Functioning/Environment              Frequency  Min 2X/week        Progress Toward Goals  OT Goals(current goals can now be found in the care plan section)  Progress towards OT goals: Progressing toward goals  Acute Rehab OT Goals OT Goal Formulation: With patient Time For Goal Achievement: 08/26/22 Potential to Achieve Goals: [place] Discharge plan remains appropriate;Frequency remains appropriate    Co-evaluation                 AM-PAC OT "6 Clicks" Daily Activity     Outcome Measure   Help from another person eating meals?: A Little Help from another person taking care of personal grooming?: A Little Help from another person toileting, which includes using toliet, bedpan, or urinal?: A Lot Help from another person bathing (including washing, rinsing, drying)?: A Lot Help from another person to put on and taking off regular upper body clothing?: A Little Help from another person to put on and taking off regular lower body clothing?: A Lot 6 Click Score: 15    End of Session    OT Visit Diagnosis: Unsteadiness on feet (R26.81);History of falling (Z91.81);Muscle weakness (generalized) (M62.81)   Activity Tolerance Patient limited by pain   Patient Left in bed;with call bell/phone within reach;with bed alarm set   Nurse Communication          Time: 1510-1520 OT Time Calculation (min): 10 min  Charges: OT General Charges $OT Visit: 1 Visit OT Treatments $Self Care/Home Management : 8-22 mins Josiah Lobo, PhD, MS, OTR/L 08/18/22, 4:29 PM

## 2022-08-18 NOTE — TOC Progression Note (Signed)
Transition of Care [facility]) - Progression Note    Patient Details  Name: JENNICA TAGLIAFERRI MRN: [id number] Date of Birth: 03/05/1951  Transition of Care [facility]) CM/SW [place], [place] Phone Number: 08/18/2022, 11:24 AM  Clinical Narrative:     TOC spoke with the patient at bedside. The patient would like to transfer to [place] for SNF. Patient needs food resources.  Expected Discharge Plan: [place] Barriers to Discharge: Continued Medical Work up  Expected Discharge Plan and Services Expected Discharge Plan: [place] In-house Referral: Clinical Social Work   Post Acute Care Choice: [place] Living arrangements for the past 2 months: Single Family Home                      [facility]                 Social Determinants of Health (SDOH) Interventions    Readmission Risk Interventions     No data to display

## 2022-08-18 NOTE — Progress Notes (Signed)
Pt refused to change the dressing in the morning.  HYDROmorphone '2mg'$  IV push given per order at 1330. 1400 pt is still being refused to change dressing.

## 2022-08-18 NOTE — Progress Notes (Signed)
ID Pt restless in discomfort    O/e restless Patient Vitals for the past 24 hrs:  BP Temp Temp src Pulse Resp SpO2  08/18/22 0734 (!) 169/59 98.4 F (36.9 C) Oral 74 16 97 %  08/18/22 0426 (!) 153/59 98.9 F (37.2 C) Oral 70 18 98 %  08/17/22 2043 (!) 147/56 98.3 F (36.8 C) -- 70 20 98 %  08/17/22 1601 (!) 146/75 98.4 F (36.9 C) -- 73 16 95 %    Chest b/l air entry Hs s1s2 Abd soft Left knee minimal swelling Restricted movt because of pain Left foot- lateral ulcer and great toe callus/ulcer   Labs    Latest Ref Rng & Units 08/17/2022    5:30 AM 08/16/2022    5:10 AM 08/15/2022    5:34 AM  CBC  WBC 4.0 - 10.5 K/uL 16.0  15.0  14.9   Hemoglobin 12.0 - 15.0 g/dL 10.6  10.6  9.8   Hematocrit 36.0 - 46.0 % 32.4  32.4  29.4   Platelets 150 - 400 K/uL 273  260  247        Latest Ref Rng & Units 08/17/2022    5:30 AM 08/16/2022    5:10 AM 08/15/2022    5:34 AM  CMP  Glucose 70 - 99 mg/dL 182  160  181   BUN 8 - 23 mg/dL 41  44  46   Creatinine 0.44 - 1.00 mg/dL 1.69  1.71  1.80   Sodium 135 - 145 mmol/L 139  137  142   Potassium 3.5 - 5.1 mmol/L 3.4  3.4  3.7   Chloride 98 - 111 mmol/L 107  107  105   CO2 22 - 32 mmol/L '22  22  24   '$ Calcium 8.9 - 10.3 mg/dL 8.7  8.6  9.4     Micro 11/30 BC Strep pneumo 12/1 BC- NG Left foot wound- Ng so far  Impression/recommendation   ?71 yr female presenting with multiple falls And Left knee pain for the past 3 daysl   Strep pneumo bacteremia Source currently not clear-  left foot wound is MSSA Concern for left knee secondary infection Underwent aspiration of the knee after a dose of ceftriaxone and vanco- Monosodium urate crystals in the fluid with cell count of > 25K- culture neg so far Being treated like acute gout -She - received  3 days of prednisone - there was preliminary improvement in the pain, but now not so much Cannot say for sure that she no infection in the knee- but culture neg, and because she continues to  have severe knee pain and Opioids are making her restless and confused we could try intra articular steroids as recommended earlier on by Dr.Patel- She is on antibiotics and hence fear of acute flare up of knee with infection is less likely will observe closely  TEE negative for  endocarditis Continue ceftriaxone- duration total 4 weeks   Diabetes mellitus with neuropathy, charcot foot and wound left foot X 2 The left lateral foot wound appears to be infected, where as the great toie wound looks chronic, but culture has been negative   Rhabdomyolysis- due to fall- resolved   AKI   Charcot foot- H/o left Ankle surgey- has hardware       DM  Discussed the management with patient and  care team  Discussed with care team ID will follow her peripherally this weekend- call if needed

## 2022-08-18 NOTE — Care Management Important Message (Signed)
Important Message  Patient Details  Name: Summer [name] MRN: [id number] Date of Birth: Dec 23, 1950   Medicare Important Message Given:  Yes     Dannette Barbara 08/18/2022, 11:05 AM

## 2022-08-18 NOTE — TOC Progression Note (Signed)
Transition of Care [facility]) - Progression Note    Patient Details  Name: Summer [name] MRN: [id number] Date of Birth: [birth year]  Transition of Care [facility]) CM/SW [place], [place] Phone Number: 08/18/2022, 1:47 PM  Clinical Narrative:     TOC spoke to [facility] and they can assist with insurance authorization.  Expected Discharge Plan: [place] Barriers to Discharge: Continued Medical Work up  Expected Discharge Plan and Services Expected Discharge Plan: [place] In-house Referral: Clinical Social Work   Post Acute Care Choice: [place] Living arrangements for the past 2 months: Single Family Home                                       Social Determinants of Health (SDOH) Interventions    Readmission Risk Interventions     No data to display

## 2022-08-18 NOTE — Consult Note (Signed)
ORTHOPAEDIC CONSULTATION  REQUESTING PHYSICIAN: Wouk, Noah Bedford, MD  Chief Complaint:   Left knee pain and swelling.  History of Present Illness: Summer [name] is a 71 y.o. female with multiple medical problems who presented last week with sepsis and blood cultures positive for strep.  She also complained of increased pain and swelling in the knee, so concern was raised that she had a infection in her knee.  However, an aspiration by Dr. Patel demonstrated 38,000 white cells and gram-negative birefringent crystals, but no bacteria.  Therefore, the diagnosis of gout was made.  The patient was advised to be managed medically.  However, because of her poor kidney function, she is not able to tolerate anti-inflammatories well.  Furthermore, because of her diabetes, she is not able to tolerate high-dose steroids.  Therefore, she has not been on any significant medication management for the gout symptoms.  She continues to have significant pain in her knee with difficulty bending her knee or weightbearing.  Therefore, Dr. Patel was asked to reevaluate the patient and consider a steroid injection.  However, he is off today so I have been asked to evaluate this patient on his behalf.  Past Medical History:  Diagnosis Date   Anemia    Arthritis    Asthma    B12 deficiency    Back pain    Carpal tunnel syndrome    Chronic kidney disease    Constipation    Depression    Depressive disorder    Diabetes mellitus    Dyspnea    Fluid retention    Foot pain    GERD (gastroesophageal reflux disease)    Headache    History of hiatal hernia    Hyperlipidemia    Hypertension    IBS (irritable bowel syndrome)    Insomnia    Joint pain    Lumbago    Memory loss    Obesity    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)    Pneumonia    Rhinitis, allergic    Rosacea    Sleep apnea    SOB (shortness of breath)    Thyroid disease    Unspecified  hearing loss    Unspecified hereditary and idiopathic peripheral neuropathy    Unspecified sleep apnea    Vitamin D deficiency    Past Surgical History:  Procedure Laterality Date   ABDOMINAL HYSTERECTOMY  1975   ANKLE SURGERY Left approx Jan 2018   APPENDECTOMY  1970   CATARACT EXTRACTION  01/2011   right   COLONOSCOPY WITH PROPOFOL N/A 08/19/2019   Procedure: COLONOSCOPY WITH PROPOFOL;  Surgeon: Anna, Kiran, MD;  Location: [facility] ENDOSCOPY;  Service: Gastroenterology;  Laterality: N/A;   eye lid surgery  2013   bilateral   FOOT SURGERY     LOWER EXTREMITY ANGIOGRAPHY Left 11/23/2020   Procedure: LOWER EXTREMITY ANGIOGRAPHY;  Surgeon: Schnier, Gregory G, MD;  Location: [facility]IVE [facility];  Service: Cardiovascular;  Laterality: Left;   NECK SURGERY     SPINE SURGERY     TEE WITHOUT CARDIOVERSION N/A 08/16/2022   Procedure: TRANSESOPHAGEAL ECHOCARDIOGRAM (TEE);  Surgeon: Agbor-Etang, Brian, MD;  Location: [facility] ORS;  Service: Cardiovascular;  Laterality: N/A;   TUBAL LIGATION     VAGINAL HYSTERECTOMY  1989   Social History   Socioeconomic History   Marital status: Significant Other    Spouse name: Darrell Lambe   Number of children: 1   Years of education: Not on file   Highest education level: High school   graduate  Occupational History   Occupation: retired  Tobacco Use   Smoking status: Never   Smokeless tobacco: Never  Vaping Use   Vaping Use: Never used  Substance and Sexual Activity   Alcohol use: No    Alcohol/week: 0.0 standard drinks of alcohol   Drug use: No   Sexual activity: Yes    Partners: Male  Other Topics Concern   Not on file  Social History Narrative   Son lives in [place] (retired from [employer]) and her granddaughter (54yr old[place]   Social Determinants of Health   Financial Resource Strain: [place](08/30/2021)   Overall Financial Resource Strain ([place]    Difficulty of Paying Living Expenses: Somewhat hard  Food  Insecurity: Food Insecurity Present (08/12/2022)   Hunger Vital Sign    Worried About RChampionin the Last Year: Sometimes true    Ran Out of Food in the Last Year: Sometimes true  Transportation Needs: No Transportation Needs (08/12/2022)   PRAPARE - THydrologist(Medical): No    Lack of Transportation (Non-Medical): No  Physical Activity: Inactive (08/30/2021)   Exercise Vital Sign    Days of Exercise per Week: 0 days    Minutes of Exercise per Session: 0 min  Stress: No Stress Concern Present (08/30/2021)   [place]   Feeling of Stress : Not at all  Social Connections: Moderately Isolated (08/30/2021)   Social Connection and Isolation Panel [NHANES]    Frequency of Communication with Friends and Family: More than three times a week    Frequency of Social Gatherings with Friends and Family: Once a week    Attends Religious Services: Never    AMarine scientistor Organizations: No    Attends CMusic therapist Never    Marital Status: Living with partner   Family History  Problem Relation Age of Onset   Aneurysm Mother    Aortic aneurysm Mother    Hypertension Mother    Hyperlipidemia Mother    Heart disease Mother    Obesity Mother    Heart attack Maternal Grandfather    Diabetes Maternal Grandfather    Allergies  Allergen Reactions   Codeine Other (See Comments)    "TRIPPED OUT"  DIDN'T LIKE THE WAY IT FELT   Atorvastatin     muscle pain   Hydrocodone     itching   Tramadol    Latex Rash   Zolpidem Other (See Comments)    Sleep walk   Prior to Admission medications   Medication Sig Start Date End Date Taking? Authorizing Provider  ACCU-CHEK SOFTCLIX LANCETS lancets  08/26/17  Yes [provider]  Alcohol Swabs (B-D SINGLE USE SWABS REGULAR) PADS  01/04/18  Yes [provider]  amLODipine (NORVASC) 5 MG tablet Take 1 tablet  (5 mg total) by mouth daily. 07/04/22  Yes Sowles, KDrue Stager MD  ASPIRIN LOW DOSE 81 MG EC tablet TAKE 1 TABLET (81 MG TOTAL) BY MOUTH DAILY. 05/31/16  Yes Sowles, KDrue Stager MD  Blood Glucose Monitoring Suppl (ACCU-CHEK AVIVA PLUS) w/Device KIT  11/02/15  Yes [provider]  Budeson-Glycopyrrol-Formoterol (BREZTRI AEROSPHERE) 160-9-4.8 MCG/ACT AERO Inhale 2 puffs into the lungs in the morning and at bedtime. 10/19/21  Yes Kasa, KMaretta Bees MD  buPROPion (WELLBUTRIN XL) 150 MG 24 hr tablet Take 1 tablet (150 mg total) by mouth daily. 07/04/22  Yes Sowles, Krichna, MD  Cholecalciferol (VITAMIN D3) 25 MCG (1000 UT) CAPS Take 1,000 Units by mouth.   Yes [provider]  Continuous Blood Gluc Sensor (FREESTYLE LIBRE 2 SENSOR) MISC FOR GLUCOSE MONITORING 06/27/22  Yes [provider]  Cyanocobalamin (VITAMIN B12 PO) Take 1 tablet by mouth daily.    Yes [provider]  ezetimibe (ZETIA) 10 MG tablet Take 1 tablet (10 mg total) by mouth daily. 05/22/22  Yes Sowles, Krichna, MD  furosemide (LASIX) 20 MG tablet Take 1 tablet (20 mg total) by mouth daily. 07/04/22  Yes Sowles, Krichna, MD  insulin NPH-regular Human (70-30) 100 UNIT/ML injection Inject 100 Units into the skin daily with supper.   Yes O'Connell, Thomas L, MD  irbesartan-hydrochlorothiazide (AVALIDE) 300-12.5 MG tablet Take 1 tablet by mouth daily. 04/14/22  Yes Sowles, Krichna, MD  ketoconazole (NIZORAL) 2 % cream Apply 1 Application topically daily. 07/04/22  Yes Sowles, Krichna, MD  levothyroxine (SYNTHROID) 25 MCG tablet Take 1 tablet (25 mcg total) by mouth daily before breakfast. Two on Sunday 07/04/22  Yes Sowles, Krichna, MD  metoprolol tartrate (LOPRESSOR) 25 MG tablet Take 1 tablet (25 mg total) by mouth 2 (two) times daily. 07/04/22  Yes Sowles, Krichna, MD  Misc Natural Products (OSTEO BI-FLEX ADV JOINT SHIELD PO) Take by mouth 2 (two) times daily.    Yes [provider]  modafinil (PROVIGIL) 100 MG  tablet Take 1 tablet (100 mg total) by mouth daily. 07/04/22  Yes Sowles, Krichna, MD  montelukast (SINGULAIR) 10 MG tablet Take 10 mg by mouth daily. 08/10/22  Yes [provider]  omeprazole (PRILOSEC) 40 MG capsule Take 1 capsule (40 mg total) by mouth daily. 07/04/22  Yes Sowles, Krichna, MD  Potassium 99 MG TABS Take by mouth daily at 6 (six) AM.   Yes [provider]  pregabalin (LYRICA) 300 MG capsule Take 300 mg by mouth 2 (two) times a day. 03/06/19  Yes O'Connell, Thomas L, MD  Probiotic Product (PROBIOTIC-10 PO) Take by mouth.   Yes [provider]  RELION INSULIN SYRINGE 1ML/31G 31G X 5/16" 1 ML MISC  12/19/15  Yes [provider]  rosuvastatin (CRESTOR) 40 MG tablet Take 1 tablet (40 mg total) by mouth daily. 04/14/22  Yes Sowles, Krichna, MD  sertraline (ZOLOFT) 100 MG tablet Take 1.5 tablets (150 mg total) by mouth daily. 07/04/22  Yes Sowles, Krichna, MD  VASCEPA 1 g capsule Take 2 capsules (2 g total) by mouth 2 (two) times daily. 04/04/22  Yes Sowles, Krichna, MD  acetaminophen (TYLENOL 8 HOUR) 650 MG CR tablet Take 1 tablet (650 mg total) by mouth every 8 (eight) hours as needed for pain. 10/16/18   Sowles, Krichna, MD  albuterol (VENTOLIN HFA) 108 (90 Base) MCG/ACT inhaler Inhale 2 puffs into the lungs every 6 (six) hours as needed for wheezing or shortness of breath. 01/05/22   Kasa, Kurian, MD  fluconazole (DIFLUCAN) 150 MG tablet Take 1 tablet (150 mg total) by mouth every other day. Patient not taking: Reported on 08/10/2022 07/04/22   Sowles, Krichna, MD  metFORMIN (GLUCOPHAGE) 500 MG tablet Take 2 tablets by mouth daily with breakfast. 08/16/22   Sowles, Krichna, MD  traZODone (DESYREL) 100 MG tablet Take 1 tablet (100 mg total) by mouth at bedtime as needed for sleep. 07/04/22   Sowles, Krichna, MD   No results found.  Positive ROS: All other systems have been reviewed and were otherwise negative with the exception of those mentioned   in the HPI  and as above.  Physical Exam: General:  Alert, no acute distress Psychiatric:  Patient is competent for consent with normal mood and affect   Cardiovascular:  No pedal edema Respiratory:  No wheezing, non-labored breathing GI:  Abdomen is soft and non-tender Skin:  No lesions in the area of chief complaint Neurologic:  Sensation intact distally Lymphatic:  No axillary or cervical lymphadenopathy  Orthopedic Exam:  Orthopedic examination is limited to the left knee and lower extremity.  Skin inspection around the left knee is notable for mild swelling as well as a small area of abrasion/contusion in the pretibial region, but no erythema, ecchymosis, or other skin abnormalities are identified.  She does exhibit a small effusion.  She has moderate tenderness to palpation over the medial and lateral aspects of the knee, as well as in the peripatellar region.  She has more severe pain with any attempted active or passive motion of the knee.  She is only able to tolerate knee range of motion from 20 to 30 degrees of flexion passively.  Sensation is intact to the lower leg region.  Her left foot is in a dressing which was not taken down.  X-rays:  Recent x-rays of the left knee are available for review and have been reviewed by myself.  These nonweightbearing films demonstrate evidence of advanced tricompartmental degenerative joint disease as manifested by medial joint space narrowing and significant osteophyte formation involving all 3 compartments.  No lytic lesions or fractures are identified.  Assessment: Acute gout left knee.  Plan: The treatment options have been discussed with the patient.  Given that she is not able to tolerate normal gout medication due to her poor kidney function, and has diabetes, limiting the ability to care for oral or IV steroid medication, the patient is offered a steroid injection into the left knee.  The patient would like to proceed with this option.  Therefore,  after obtaining verbal consent, the left knee is injected sterilely using a solution of 2 cc of Kenalog 40 (80 mg) and 8 cc of 0.5% Sensorcaine.  The patient tolerated the procedure well.  The patient may continue to be mobilized with physical therapy, weightbearing as tolerated on the left leg.  She may receive her medication for pain as deemed appropriate based on her other medical issues.  Thank you for asking me to participate in the care of this most unfortunate woman.  Either Dr. Posey Pronto or myself will continue to follow her from a distance.   Pascal Lux, MD  Beeper #:  [id number]  08/18/2022 6:34 PM

## 2022-08-18 NOTE — Progress Notes (Addendum)
Progress Note   Patient: Summer [name] OIT:[id number] DOB: 09/07/1951 DOA: 08/10/2022     8 DOS: the patient was seen and examined on 08/18/2022   Brief hospital course: Summer [name] is a 71 y.o. female with medical history significant for Diabetes with polyneuropathy with a chronic left foot wound previously seen at the wound care clinic, OSA, CKD stage IIIb, depression, HTN, HFpEF, class III obesity, OSA and hypothyroidism,Who presents to the ED via EMS with frequent falls over the past 3 days, now with severe left knee pain.  Upon arriving the hospital, temperature was 101.1, HR 117, creatinine 1.89.  CK level 6435.  UA abnormal, x-ray showed mild to moderate left knee joint effusion. Patient was started on broad spectrum antibiotics for possible sepsis.  Seen by orthopedics, knee tap results support diagnosis of gout.     12/1: Podiatry recommends conservative management with dressing changes and weightbearing as tolerated with surgical shoe.  Left knee aspiration by Ortho the fluid suggestive of gout.  Started on steroids 12/2: PT, OT eval recommends SNF 12/3: Waiting for SNF placement, TOC aware, CK improving.  ID consult for strep pneumo bacteremia 12/4: Not motivated to work with therapy.  Palliative care consult, 2D echo and 2 sets of blood culture per ID, starting allopurinol for gout 12/5: Ortho reeval, TEE, stopped steroids 12/6: TEE completed, negative  Assessment and Plan: * Bacteremia due to Streptococcus pneumoniae Continue IV Rocephin for now.  ID following, unclear source. TEE negative  Traumatic rhabdomyolysis ([name]) Frequent falls CK over 6000-> 3308> 1958> 973 Now off IVF PT, OT recommend SNF.  TOC working on placement. Has bed, now pending insurance auth  Sepsis (Summer [name]) 2/2 bacteremia Sepsis criteria includes fever, tachycardia, leukocytosis and AKI.  Lactic acid was normal Continue empiric Rocephin, wound culture from the foot showing. S aureur. Repeat blood cultures  from 12/3 are negative thus far - will discuss wound culture results w/ ID  Acute pain of left knee Patient with severe 10/10 pain left knee on admission.  Seen by Ortho who did aspiration of left knee with fluid consistent with gout Treated with 3 days of oral steroids.  Pain much better controlled now.  started allopurinol 50 mg p.o. daily for maintenance regimen for gout  Ulcer of left foot due to type 2 diabetes mellitus (Summer [name]) Left foot/great toe ulceration without underlying osteomyelitis Seen by podiatry.  Thought to be due to hallux malleus contracture leading to excessive stress to the distal tip of the hallux and therefore ulceration.  Continue Betadine wet-to-dry dressing, weightbearing as tolerated with surgical shoe.  No surgery plans per podiatry.  Outpatient podiatry follow-up with Dr. Amalia Hailey in 1 week post discharge Continue IV Rocephin for now; abx as above  Uncontrolled type 2 diabetes mellitus with hyperglycemia, with long-term current use of insulin ([name]) Continue insulin Semglee, sliding scale.  Last A1c about 2 months ago was 7.5  Acute renal failure superimposed on stage 3b chronic kidney disease (Summer [name]) Secondary to sepsis and possibly rhabdo Now off fluids and hgb stable Hold metformin.  Avoid nephrotoxins  Major depressive disorder, recurrent episode, moderate ([name]) Continue bupropion, sertraline and trazodone.  (HFpEF) heart failure with preserved ejection fraction ([name]) Clinically euvolemic to dry continue metoprolol.  Will hold furosemide due to AKI.  Morbid obesity with BMI of 40.0-44.9, adult (Summer [name]) Complicating factor to overall prognosis and care.  Palliative care consult  Adult hypothyroidism Continue levothyroxine.  Obstructive sleep apnea Continue CPAP.  Mucosal bleeding Lips are scabbed.  Eyes and inside of mouth normal, denies genital involvement. Severe chapped lips? Improving with emolient.  HTN Bp elevated - will increase amlod to 10        Subjective: feeling fatigued, knee pain improved w/ prn meds. Says fatigue and overall feeling of being unwell is improving  Physical Exam: Vitals:   08/17/22 1601 08/17/22 2043 08/18/22 0426 08/18/22 0734  BP: (!) 146/75 (!) 147/56 (!) 153/59 (!) 169/59  Pulse: 73 70 70 74  Resp: '16 20 18 16  '$ Temp: 98.4 F (36.9 C) 98.3 F (36.8 C) 98.9 F (37.2 C) 98.4 F (36.9 C)  TempSrc:   Oral Oral  SpO2: 95% 98% 98% 97%  Weight:      Height:       71 year old female lying in the bed, chyronically ill appearing Lungs clear to auscultation bilaterally Cardiovascular regular rate and rhythm Scabs on lips, interior of mouth appears normal though dentition poor Abdomen soft, benign Extremities: No tenderness of the left knee.  No effusion of right knee. Left foot bandaged Neuro alert and awake, nonfocal Data Reviewed:    Family Communication: sig o Summer [name] updated @ bedside 12/8  Disposition: Status is: Inpatient Remains inpatient appropriate because: unsafe d/c plan  Planned Discharge Destination: Skilled nursing facility   DVT prophylaxis-Lovenox   Author: Desma Maxim, MD 08/18/2022 1:02 PM  For on call review [email or web address].

## 2022-08-18 NOTE — Progress Notes (Addendum)
Daily Progress Note   Patient Name: Summer [name]       Date: 08/18/2022 DOB: January 02, 1951  Age: 71 y.o. MRN#: [id number] Attending Physician: Gwynne Edinger, MD Primary Care Physician: Steele Sizer, MD Admit Date: 08/10/2022  Reason for Consultation/Follow-up: Establishing goals of care  Subjective: Notes and labs reviewed.  Into see patient.  Significant other Darrell is at bedside.  She gives me permission to speak regarding care and decisions.  Patient states her pain is improved from what it was yesterday.  Discussed using oral medications for pain and weaning off IV medications as those will not be available outside of the hospital.  She is considering her thoughts on going to skilled nursing versus going home.  Discussed needing to be safe upon discharge.  They discussed that her son lives in [place].  They discussed that son currently would be surrogate decision maker, and patient has a sister as well.  Discussed the purpose and duties of an [place].  Discussed that H POA forms could be completed if patient would like.    Length of Stay: 8  Current Medications: Scheduled Meds:   allopurinol  50 mg Oral Daily   [START ON 08/19/2022] amLODipine  10 mg Oral Daily   aspirin EC  81 mg Oral Daily   buPROPion  150 mg Oral Daily   enoxaparin (LOVENOX) injection  0.5 mg/kg Subcutaneous Q24H   ezetimibe  10 mg Oral Daily   icosapent Ethyl  2 g Oral BID   insulin aspart  0-20 Units Subcutaneous TID WC   insulin aspart  0-5 Units Subcutaneous QHS   insulin glargine-yfgn  10 Units Subcutaneous QHS   levothyroxine  25 mcg Oral Once per day on Mon Tue Wed Thu Fri Sat   And   levothyroxine  50 mcg Oral Once per day on Sun   metoprolol tartrate  25 mg Oral BID   modafinil  100 mg Oral Daily    pantoprazole  40 mg Oral Daily   sertraline  150 mg Oral Daily    Continuous Infusions:  cefTRIAXone (ROCEPHIN)  IV 2 g (08/18/22 1000)    PRN Meds: acetaminophen **OR** acetaminophen, albuterol, bisacodyl, HYDROcodone-acetaminophen, HYDROmorphone (DILAUDID) injection, ondansetron **OR** ondansetron (ZOFRAN) IV, traZODone  Physical Exam Pulmonary:     Effort: Pulmonary effort is normal.  Neurological:     Mental Status: She is alert.             Vital Signs: BP (!) 169/59 (BP Location: Right Arm)   Pulse 74   Temp 98.4 F (36.9 C) (Oral)   Resp 16   Ht '5\' 2"'$  (1.575 m)   Wt 109.3 kg   SpO2 97%   BMI 44.08 kg/m  SpO2: SpO2: 97 % O2 Device: O2 Device: Room Air O2 Flow Rate: O2 Flow Rate (L/min): 2 L/min  Intake/output summary:  Intake/Output Summary (Last 24 hours) at 08/18/2022 1425 Last data filed at 08/18/2022 1540 Gross per 24 hour  Intake 617.04 ml  Output 1350 ml  Net -732.96 ml   LBM: Last BM Date : 08/17/22 (per pt) Baseline Weight: Weight: 109.3 kg Most recent weight: Weight: 109.3 kg     Patient Active Problem List   Diagnosis Date Noted   Multiple falls 08/15/2022   Gouty arthritis 08/15/2022   Pyogenic arthritis of left knee joint ([place]) 08/15/2022   Diabetic ulcer of left midfoot associated with diabetes mellitus due to underlying condition, limited to breakdown of skin ([place]) 08/15/2022   Bacteremia due to Streptococcus pneumoniae 08/12/2022   Acute pain of left knee 08/67/6195   Metabolic acidosis 09/32/6712   Sepsis ([place]) 08/10/2022   Traumatic rhabdomyolysis ([place]) 08/10/2022   Urinary tract infection 08/10/2022   Ulcer of left foot due to type 2 diabetes mellitus ([place]) 08/10/2022   Acute renal failure superimposed on stage 3b chronic kidney disease ([place]) 08/10/2022   Major depression in remission ([place]) 07/04/2022   Atherosclerosis of aorta ([place]) 07/04/2022   Insomnia due to other mental disorder 07/04/2022   Amputated toe of left foot  ([place]) 07/04/2022   Chronic obstructive pulmonary disease ([place]) 01/28/2021   Diabetes mellitus ([place]) 05/24/2020   B12 deficiency 05/24/2020   Vitamin D deficiency 05/24/2020   Metatarsalgia of left foot 01/15/2020   Major depressive disorder, recurrent episode, moderate ([place]) 04/04/2019   Left arm weakness 04/03/2019   (HFpEF) heart failure with preserved ejection fraction ([place]) 08/31/2017   Moderate persistent asthma 08/31/2017   Hyperlipidemia associated with type 2 diabetes mellitus ([place]) 06/14/2017   Uncontrolled type 2 diabetes mellitus with hyperglycemia, with long-term current use of insulin ([place]) 08/18/2016   Charcot foot due to diabetes mellitus ([place]) 02/22/2016   Acquired abduction deformity of foot 07/12/2015   Osteoarthritis of subtalar joint 07/12/2015   Arthritis of foot, degenerative 07/12/2015   Carpal tunnel syndrome 04/17/2015   Chronic constipation 04/17/2015   Insomnia, persistent 04/17/2015   Stage 3b chronic kidney disease ([place]) 04/17/2015   Decreased exercise tolerance 04/17/2015   Diabetes mellitus with polyneuropathy ([place]) 04/17/2015   Gastroesophageal reflux disease without esophagitis 04/17/2015   Bursitis, trochanteric 04/17/2015   Cephalalgia 04/17/2015   Benign hypertension 04/17/2015   Adult hypothyroidism 04/17/2015   Hearing loss 04/17/2015   Chronic recurrent major depressive disorder ([place]) 04/17/2015   Neurogenic claudication 04/17/2015   Morbid obesity with BMI of 40.0-44.9, adult ([place]) 04/17/2015   Hypo-ovarianism 04/17/2015   Perennial allergic rhinitis with seasonal variation 04/17/2015   Acne erythematosa 04/17/2015   Dyskinesia, tardive 04/17/2015   Memory loss 04/17/2015   Impingement syndrome of shoulder 04/17/2015   Dermatitis, stasis 04/17/2015   Obstructive sleep apnea 05/14/2014   Shortness of breath on exertion 05/06/2014   Mixed hyperlipidemia 02/06/2012   LBP (low back pain) 09/16/2008    Palliative Care Assessment & Plan     Recommendations/Plan: Recommend continuing  as needed Norco for pain, 1 pill for moderate pain 2 pills for severe pain.  Recommend working to discontinue IV Dilaudid, and if available, only to be used for breakthrough pain.  Would recommend consulting spiritual care for completion of H POA papers if needed.  Code Status:    Code Status Orders  (From admission, onward)           Start     Ordered   08/11/22 0024  Do not attempt resuscitation (DNR)  Continuous       Question Answer Comment  In the event of cardiac or respiratory ARREST Do not call a "code blue"   In the event of cardiac or respiratory ARREST Do not perform Intubation, CPR, defibrillation or ACLS   In the event of cardiac or respiratory ARREST Use medication by any route, position, wound care, and other measures to relive pain and suffering. May use oxygen, suction and manual treatment of airway obstruction as needed for comfort.      08/11/22 0023           Code Status History     Date Active Date Inactive Code Status Order ID Comments User Context   08/11/2022 0002 08/11/2022 0023 Full Code [id number]  Athena Masse, MD ED   11/23/2020 1127 11/23/2020 1902 Full Code [id number]  Delana Meyer, Dolores Lory, MD Inpatient   04/04/2019 0914 04/04/2019 1806 DNR [id number]  Bettey Costa, MD Inpatient   04/02/2019 1858 04/04/2019 0914 Full Code [id number]  Lang Snow, NP ED      Advance Directive Documentation    Flowsheet Row Most Recent Value  Type of Advance Directive Healthcare Power of Attorney, Living will  Pre-existing out of facility DNR order (yellow form or pink MOST form) --  "MOST" Form in Place? --      Thank you for allowing the Palliative Medicine Team to assist in the care of this patient.    Asencion Gowda, NP  Please contact Palliative Medicine Team phone at [phone] for questions and concerns.

## 2022-08-19 DIAGNOSIS — B953 Streptococcus pneumoniae as the cause of diseases classified elsewhere: Secondary | ICD-10-CM | POA: Diagnosis not present

## 2022-08-19 DIAGNOSIS — R7881 Bacteremia: Secondary | ICD-10-CM | POA: Diagnosis not present

## 2022-08-19 LAB — BASIC METABOLIC PANEL
Anion gap: 10 (ref 5–15)
BUN: 30 mg/dL — ABNORMAL HIGH (ref 8–23)
CO2: 21 mmol/L — ABNORMAL LOW (ref 22–32)
Calcium: 8.7 mg/dL — ABNORMAL LOW (ref 8.9–10.3)
Chloride: 105 mmol/L (ref 98–111)
Creatinine, Ser: 1.41 mg/dL — ABNORMAL HIGH (ref 0.44–1.00)
GFR, Estimated: 39 mL/min — ABNORMAL LOW (ref >60)
Glucose, Bld: 225 mg/dL — ABNORMAL HIGH (ref 70–99)
Potassium: 3.5 mmol/L (ref 3.5–5.1)
Sodium: 136 mmol/L (ref 135–145)

## 2022-08-19 LAB — CBC WITH DIFFERENTIAL/PLATELET
Abs Immature Granulocytes: 2.65 10*3/uL — ABNORMAL HIGH (ref 0.00–0.07)
Basophils Absolute: 0.2 10*3/uL — ABNORMAL HIGH (ref 0.0–0.1)
Basophils Relative: 1 %
Eosinophils Absolute: 0.1 10*3/uL (ref 0.0–0.5)
Eosinophils Relative: 0 %
HCT: 28.7 % — ABNORMAL LOW (ref 36.0–46.0)
Hemoglobin: 9.6 g/dL — ABNORMAL LOW (ref 12.0–15.0)
Immature Granulocytes: 14 %
Lymphocytes Relative: 6 %
Lymphs Abs: 1.2 10*3/uL (ref 0.7–4.0)
MCH: 28.2 pg (ref 26.0–34.0)
MCHC: 33.4 g/dL (ref 30.0–36.0)
MCV: 84.2 fL (ref 80.0–100.0)
Monocytes Absolute: 1 10*3/uL (ref 0.1–1.0)
Monocytes Relative: 6 %
Neutro Abs: 13.4 10*3/uL — ABNORMAL HIGH (ref 1.7–7.7)
Neutrophils Relative %: 73 %
Platelets: 312 10*3/uL (ref 150–400)
RBC: 3.41 MIL/uL — ABNORMAL LOW (ref 3.87–5.11)
RDW: 16.1 % — ABNORMAL HIGH (ref 11.5–15.5)
WBC: 18.6 10*3/uL — ABNORMAL HIGH (ref 4.0–10.5)
nRBC: 0.2 % (ref 0.0–0.2)

## 2022-08-19 LAB — SEDIMENTATION RATE: Sed Rate: 107 mm/hr — ABNORMAL HIGH (ref 0–30)

## 2022-08-19 LAB — GLUCOSE, CAPILLARY
Glucose-Capillary: 247 mg/dL — ABNORMAL HIGH (ref 70–99)
Glucose-Capillary: 223 mg/dL — ABNORMAL HIGH (ref 70–99)
Glucose-Capillary: 237 mg/dL — ABNORMAL HIGH (ref 70–99)
Glucose-Capillary: 227 mg/dL — ABNORMAL HIGH (ref 70–99)

## 2022-08-19 LAB — C-REACTIVE PROTEIN: CRP: 15.9 mg/dL — ABNORMAL HIGH (ref <1.0)

## 2022-08-19 MED ORDER — INSULIN GLARGINE-YFGN 100 UNIT/ML ~~LOC~~ SOLN
15.0000 [IU] | Freq: Every day | SUBCUTANEOUS | Status: DC
  Administered 2022-08-19 – 2022-08-20 (×2): 15 [IU] via SUBCUTANEOUS
  Filled 2022-08-19 (×2): qty 0.15

## 2022-08-19 NOTE — TOC Progression Note (Signed)
Transition of Care [facility]) - Progression Note    Patient Details  Name: Summer [name] MRN: [id number] Date of Birth: November 01, 1950  Transition of [facility] CM/SW Contact  Zigmund Daniel Dorian Pod, RN Phone [phone] 08/19/2022, 6:13 PM  Clinical Narrative:    [facility] RN attempted outreach call to [facility] [phone] ext [phone] [facility]) for admission however unsuccessful and unable to leave voice message to check authorization status for pt's admission.  TOC will continue to reach out for SNF placement and auth confirmation.   Expected Discharge Plan: [place] Barriers to Discharge: Continued Medical Work up  Expected Discharge Plan and Services Expected Discharge Plan: [place] In-house Referral: Clinical Social Work   Post Acute Care Choice: [place] Living arrangements for the past 2 months: Single Family Home                                       Social Determinants of Health (SDOH) Interventions    Readmission Risk Interventions     No data to display

## 2022-08-19 NOTE — Progress Notes (Signed)
Patient ID: Summer [name], female   DOB: 1950-11-08, 71 y.o.   MRN: [id number]  Subjective: The patient feels that her knee symptoms are somewhat improved today as compared to yesterday following the injection.  She feels that she is able to move her leg a little more easily in bed today since receiving the injection.  She has no new complaints pertaining to her left knee.   Objective: Vital signs in last 24 hours: Temp:  [97 F (36.1 C)-99.5 F (37.5 C)] 97 F (36.1 C) (12/09 0740) Pulse Rate:  [65-78] 78 (12/09 0740) Resp:  [16-19] 16 (12/09 0740) BP: (144-155)/(49-73) 148/51 (12/09 0740) SpO2:  [97 %-100 %] 97 % (12/09 0740)  Intake/Output from previous day: 12/08 0701 - 12/09 0700 In: -  Out: 700 [Urine:700] Intake/Output this shift: Total I/O In: 600 [P.O.:600] Out: -   Recent Labs    08/17/22 0530 08/19/22 0356  HGB 10.6* 9.6*   Recent Labs    08/17/22 0530 08/19/22 0356  WBC 16.0* 18.6*  RBC 3.84* 3.41*  HCT 32.4* 28.7*  PLT 273 312   Recent Labs    08/17/22 0530 08/19/22 0356  NA 139 136  K 3.4* 3.5  CL 107 105  CO2 22 21*  BUN 41* 30*  CREATININE 1.69* 1.41*  GLUCOSE 182* 225*  CALCIUM 8.7* 8.7*   No results for input(s): "LABPT", "INR" in the last 72 hours.  Physical Exam: Orthopedic examination is limited to the left knee and lower extremity.  Skin inspection is notable for mild swelling as well as a small effusion, but no erythema, ecchymosis, or other skin abnormalities are identified.  She has mild tenderness palpation of the medial and lateral aspects of the knee.  She is able to tolerate logrolling of her knee better today than yesterday.  However, she still has significant pain with attempted flexion or extension of the knee.  Her range of motion remains limited to approximately 20 to 35 degrees.  She remains grossly neurovascularly intact to the left lower extremity and foot, although again, the left foot is in a soft bulky dressing which was not  taken down.  Assessment: Left knee pain and effusion secondary to acute gouty flare.  Plan: The treatment options have been reviewed with the patient.  At this point, I feel it is worth watching her knee for another day or so to see if the injection provides sufficient relief of her symptoms.  She may be mobilized with physical therapy as symptoms permit, and may receive pain medication as deemed appropriate for her medically.  If her symptoms do not improve following this injection, she may benefit from an arthroscopic irrigation and debridement of the knee.   Marshall Cork Lurae Hornbrook 08/19/2022, 12:26 PM

## 2022-08-19 NOTE — Progress Notes (Signed)
Progress Note   Patient: Summer [name]:[id number] DOB: 10-05-1950 DOA: 08/10/2022     9 DOS: the patient was seen and examined on 08/19/2022   Brief hospital course: Summer [name] is a 71 y.o. female with medical history significant for Diabetes with polyneuropathy with a chronic left foot wound previously seen at the wound care clinic, OSA, CKD stage IIIb, depression, HTN, HFpEF, class III obesity, OSA and hypothyroidism,Who presents to the ED via EMS with frequent falls over the past 3 days, now with severe left knee pain.  Upon arriving the hospital, temperature was 101.1, HR 117, creatinine 1.89.  CK level 6435.  UA abnormal, x-ray showed mild to moderate left knee joint effusion. Patient was started on broad spectrum antibiotics for possible sepsis.  Seen by orthopedics, knee tap results support diagnosis of gout.     12/1: Podiatry recommends conservative management with dressing changes and weightbearing as tolerated with surgical shoe.  Left knee aspiration by Ortho the fluid suggestive of gout.  Started on steroids 12/2: PT, OT eval recommends SNF 12/3: Waiting for SNF placement, TOC aware, CK improving.  ID consult for strep pneumo bacteremia 12/4: Not motivated to work with therapy.  Palliative care consult, 2D echo and 2 sets of blood culture per ID, starting allopurinol for gout 12/5: Ortho reeval, TEE, stopped steroids 12/6: TEE completed, negative  Assessment and Plan: * Bacteremia due to Streptococcus pneumoniae Continue IV Rocephin for now.  ID following, unclear source. TEE negative. Plan for 4 wks abx per ID  Traumatic rhabdomyolysis ([place]) Frequent falls CK over 6000-> 3308> 1958> 973 Now off IVF PT, OT recommend SNF.  TOC working on placement. Has bed, now pending insurance auth  Sepsis ([place]) 2/2 bacteremia Sepsis criteria includes fever, tachycardia, leukocytosis and AKI.  Lactic acid was normal Continue empiric Rocephin. Repeat blood cultures from 12/3 are  negative thus far. Sepsis physiology resolved  Acute pain of left knee Patient with severe 10/10 pain left knee on admission.  Seen by Ortho who did aspiration of left knee with fluid consistent with gout Treated with 3 days of oral steroids.  Still with left knee pain, now s/p steroid injection by ortho on 12/8. started allopurinol 50 mg p.o. daily for maintenance regimen for gout  Ulcer of left foot due to type 2 diabetes mellitus ([place]) Left foot/great toe ulceration without underlying osteomyelitis Seen by podiatry.  Thought to be due to hallux malleus contracture leading to excessive stress to the distal tip of the hallux and therefore ulceration.  Continue Betadine wet-to-dry dressing, weightbearing as tolerated with surgical shoe.  No surgery plans per podiatry.  Outpatient podiatry follow-up with Dr. Amalia Hailey in 1 week post discharge Continue IV Rocephin for now; abx as above  Uncontrolled type 2 diabetes mellitus with hyperglycemia, with long-term current use of insulin ([name]) Continue insulin Semglee, sliding scale.  Last A1c about 2 months ago was 7.5  Acute renal failure superimposed on stage 3b chronic kidney disease ([place]) Secondary to sepsis and possibly rhabdo Now off fluids and hgb stable Hold metformin.  Avoid nephrotoxins  Major depressive disorder, recurrent episode, moderate ([name]) Continue bupropion, sertraline and trazodone.  (HFpEF) heart failure with preserved ejection fraction ([name]) Clinically euvolemic to dry continue metoprolol.  Will hold furosemide due to AKI.  Morbid obesity with BMI of 40.0-44.9, adult ([place]) Complicating factor to overall prognosis and care.  Palliative care consult  Adult hypothyroidism Continue levothyroxine.  Obstructive sleep apnea Continue CPAP.  Mucosal bleeding Lips are  scabbed. Eyes and inside of mouth normal, denies genital involvement. Severe chapped lips? Improving with emolient.  HTN Bp elevated - will increase amlod to  10       Subjective: feeling fatigued left knee pain not much better today  Physical Exam: Vitals:   08/18/22 1604 08/18/22 2140 08/19/22 0448 08/19/22 0740  BP: (!) 144/49 (!) 155/69 (!) 154/73 (!) 148/51  Pulse: 65 76 78 78  Resp: '18 19 18 16  '$ Temp: 97.6 F (36.4 C) 99.5 F (37.5 C) 97.7 F (36.5 C) (!) 97 F (36.1 C)  TempSrc:  Oral Oral   SpO2: 100% 98% 98% 97%  Weight:      Height:       71 year old female lying in the bed, chyronically ill appearing Lungs clear to auscultation bilaterally Cardiovascular regular rate and rhythm Scabs on lips, interior of mouth appears normal though dentition poor Abdomen soft, benign Extremities: mild effusion left knee Neuro alert and awake, nonfocal Data Reviewed:    Family Communication: sig o darrel updated telephonically 12/9  Disposition: Status is: Inpatient Remains inpatient appropriate because: unsafe d/c plan  Planned Discharge Destination: Skilled nursing facility, insurance auth pending   DVT prophylaxis-Lovenox   Author: Desma Maxim, MD 08/19/2022 1:21 PM  For on call review [email or web address].

## 2022-08-20 ENCOUNTER — Inpatient Hospital Stay: Payer: No Typology Code available for payment source

## 2022-08-20 DIAGNOSIS — R569 Unspecified convulsions: Secondary | ICD-10-CM | POA: Diagnosis not present

## 2022-08-20 DIAGNOSIS — R4182 Altered mental status, unspecified: Secondary | ICD-10-CM | POA: Diagnosis not present

## 2022-08-20 DIAGNOSIS — I6501 Occlusion and stenosis of right vertebral artery: Secondary | ICD-10-CM | POA: Diagnosis not present

## 2022-08-20 DIAGNOSIS — R930 Abnormal findings on diagnostic imaging of skull and head, not elsewhere classified: Secondary | ICD-10-CM | POA: Diagnosis not present

## 2022-08-20 DIAGNOSIS — B953 Streptococcus pneumoniae as the cause of diseases classified elsewhere: Secondary | ICD-10-CM | POA: Diagnosis not present

## 2022-08-20 DIAGNOSIS — I6621 Occlusion and stenosis of right posterior cerebral artery: Secondary | ICD-10-CM | POA: Diagnosis not present

## 2022-08-20 DIAGNOSIS — R7881 Bacteremia: Secondary | ICD-10-CM | POA: Diagnosis not present

## 2022-08-20 DIAGNOSIS — I6523 Occlusion and stenosis of bilateral carotid arteries: Secondary | ICD-10-CM | POA: Diagnosis not present

## 2022-08-20 LAB — BASIC METABOLIC PANEL
Anion gap: 7 (ref 5–15)
BUN: 31 mg/dL — ABNORMAL HIGH (ref 8–23)
CO2: 23 mmol/L (ref 22–32)
Calcium: 8.7 mg/dL — ABNORMAL LOW (ref 8.9–10.3)
Chloride: 106 mmol/L (ref 98–111)
Creatinine, Ser: 1.3 mg/dL — ABNORMAL HIGH (ref 0.44–1.00)
GFR, Estimated: 43 mL/min — ABNORMAL LOW (ref >60)
Glucose, Bld: 222 mg/dL — ABNORMAL HIGH (ref 70–99)
Potassium: 3.3 mmol/L — ABNORMAL LOW (ref 3.5–5.1)
Sodium: 136 mmol/L (ref 135–145)

## 2022-08-20 LAB — GLUCOSE, CAPILLARY
Glucose-Capillary: 291 mg/dL — ABNORMAL HIGH (ref 70–99)
Glucose-Capillary: 316 mg/dL — ABNORMAL HIGH (ref 70–99)
Glucose-Capillary: 206 mg/dL — ABNORMAL HIGH (ref 70–99)
Glucose-Capillary: 159 mg/dL — ABNORMAL HIGH (ref 70–99)
Glucose-Capillary: 211 mg/dL — ABNORMAL HIGH (ref 70–99)

## 2022-08-20 MED ORDER — DEXTROSE 5 % IV SOLN
10.0000 mg/kg | Freq: Two times a day (BID) | INTRAVENOUS | Status: DC
  Administered 2022-08-20 – 2022-08-22 (×4): 740 mg via INTRAVENOUS
  Filled 2022-08-20 (×6): qty 14.8

## 2022-08-20 MED ORDER — LEVETIRACETAM IN NACL 1500 MG/100ML IV SOLN
1500.0000 mg | Freq: Once | INTRAVENOUS | Status: AC
  Administered 2022-08-20: 1500 mg via INTRAVENOUS
  Filled 2022-08-20: qty 100

## 2022-08-20 MED ORDER — SODIUM CHLORIDE 0.9 % IV SOLN: INTRAVENOUS | Status: DC

## 2022-08-20 MED ORDER — DEXAMETHASONE SODIUM PHOSPHATE 10 MG/ML IJ SOLN
10.0000 mg | Freq: Four times a day (QID) | INTRAMUSCULAR | Status: DC
  Administered 2022-08-20 – 2022-08-21 (×4): 10 mg via INTRAVENOUS
  Filled 2022-08-20 (×4): qty 1

## 2022-08-20 MED ORDER — LORAZEPAM 2 MG/ML IJ SOLN: 2.0000 mg | INTRAMUSCULAR | Status: DC

## 2022-08-20 MED ORDER — SODIUM CHLORIDE 0.9 % IV SOLN
3000.0000 mg | Freq: Once | INTRAVENOUS | Status: DC
  Filled 2022-08-20: qty 30

## 2022-08-20 MED ORDER — IOHEXOL 350 MG/ML SOLN
100.0000 mL | Freq: Once | INTRAVENOUS | Status: AC | PRN
  Administered 2022-08-20: 100 mL via INTRAVENOUS

## 2022-08-20 MED ORDER — SODIUM CHLORIDE 0.9 % IV SOLN
2.0000 g | Freq: Two times a day (BID) | INTRAVENOUS | Status: AC
  Administered 2022-08-20 – 2022-08-26 (×12): 2 g via INTRAVENOUS
  Filled 2022-08-20 (×2): qty 20
  Filled 2022-08-20: qty 2
  Filled 2022-08-20: qty 20
  Filled 2022-08-20: qty 2
  Filled 2022-08-20: qty 20
  Filled 2022-08-20: qty 2
  Filled 2022-08-20 (×2): qty 20
  Filled 2022-08-20: qty 2
  Filled 2022-08-20 (×2): qty 20

## 2022-08-20 MED ORDER — LORAZEPAM 2 MG/ML IJ SOLN
INTRAMUSCULAR | Status: AC
  Filled 2022-08-20: qty 1

## 2022-08-20 NOTE — Progress Notes (Signed)
Telephone call at the 1C nurse's station from [place] with [employer] verbally informing this Probation officer that she did receive insurance authorization for this pt

## 2022-08-20 NOTE — Progress Notes (Signed)
   08/20/22 0830  Clinical Encounter Type  Visited With Health care provider  Visit Type Initial;Code   Chaplain responded to RRT. No family present.

## 2022-08-20 NOTE — Progress Notes (Signed)
PT Cancellation Note  Patient Details Name: Summer [name] MRN: [id number] DOB: Aug 14, 1951   Cancelled Treatment:    Reason Eval/Treat Not Completed: Medical issues which prohibited therapy  Rapid called this AM.  Nursing and MD in room for interventions.  Will defer treatment today and continue at a later time/date as appropriate.   Chesley Noon 08/20/2022, 8:59 AM

## 2022-08-20 NOTE — Consult Note (Signed)
Neurology Consultation Reason for Consult: Unresponsiveness. Referring Physician: Si Raider, N  CC: Unresponsiveness  History is obtained from: Patient    HPI: LASHAUNTA SICARD is a 71 y.o. female with a history of diabetes, hypertension, hyperlipidemia who was admitted for bacteremia. This morning she was complaining of headache. She was crying for help, stating "help me", help me" and '2mg'$  dilaudid was ordered and given. She then began having seizure with fencer posturing with left arm straight. She began having agonal breathing and a code blue was activated, but she was never pulseless. She received ativan '2mg'$  x 1. There was consideration of intuabtion, but she is DNR so code blue was aborted.      LKW: 12/09 tpa given?: no, out of window.  Premorbid modified rankin scale: 4   Past Medical History:  Diagnosis Date   Anemia    Arthritis    Asthma    B12 deficiency    Back pain    Carpal tunnel syndrome    Chronic kidney disease    Constipation    Depression    Depressive disorder    Diabetes mellitus    Dyspnea    Fluid retention    Foot pain    GERD (gastroesophageal reflux disease)    Headache    History of hiatal hernia    Hyperlipidemia    Hypertension    IBS (irritable bowel syndrome)    Insomnia    Joint pain    Lumbago    Memory loss    Obesity    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)    Pneumonia    Rhinitis, allergic    Rosacea    Sleep apnea    SOB (shortness of breath)    Thyroid disease    Unspecified hearing loss    Unspecified hereditary and idiopathic peripheral neuropathy    Unspecified sleep apnea    Vitamin D deficiency      Family History  Problem Relation Age of Onset   Aneurysm Mother    Aortic aneurysm Mother    Hypertension Mother    Hyperlipidemia Mother    Heart disease Mother    Obesity Mother    Heart attack Maternal Grandfather    Diabetes Maternal Grandfather      Social History:  reports that she has never smoked. She has never  used smokeless tobacco. She reports that she does not drink alcohol and does not use drugs.   Exam: Current vital signs: BP (!) 139/103   Pulse (!) 107   Temp 98.5 F (36.9 C)   Resp 16   Ht '5\' 2"'$  (1.575 m)   Wt 109.3 kg   SpO2 99%   BMI 44.08 kg/m  Vital signs in last 24 hours: Temp:  [97.8 F (36.6 C)-98.6 F (37 C)] 98.5 F (36.9 C) (12/10 0755) Pulse Rate:  [66-107] 107 (12/10 0829) Resp:  [16-20] 16 (12/10 0755) BP: (139-169)/(63-103) 139/103 (12/10 0829) SpO2:  [99 %-100 %] 99 % (12/10 0829)   Physical Exam   Neuro: Mental Status: Does not open eyes or follow commands.  Cranial Nerves: II: Does not blink to threat. Pupils are equal, round, and reactive to light.   III,IV, VI: right gaze deviation,  V: Facial sensation is symmetric to temperature VII: Facial movement is symmetric.  VIII: hearing is intact to voice X: Uvula elevates symmetrically XI: Shoulder shrug is symmetric. XII: tongue is midline without atrophy or fasciculations.  Motor: No movement to noxious stimulation.   Sensory: Does not  respond to nox stimulation.  Cerebellar: Does not perform.    I have reviewed labs in epic and the results pertinent to this consultation are: Cr 1.3  I have reviewed the images obtained:CT head - no acute hemorrhage.   Impression: 71 yo F with new onset severe headache and focal seizure in the setting of strep pneumo bacteremia. She is on treatment for her strep pneumo which makes meningitis less likely, but I still think that it needs to be considered.   Recommendations: 1) CTA/CTP 2) LP for cells, glucose, protein, culture.  3) increase rocephin to meningitic dosing.  4) MRI brain once stalbe 5) Keppra 3g x 1, then '500mg'$  BID 6) EEG    Roland Rack, MD [facility] [phone]  If 7pm- 7am, please page neurology on call as listed in [place].

## 2022-08-20 NOTE — Significant Event (Signed)
Rapid Response Event Note   Reason for Call : called RRT for patient altered mental status upon awakening this morning.   Initial Focused Assessment: laying in bed, restless, slightly agitated. VSS, c/o pain... generalized and headache.      Interventions: Dr Si Raider to bedside to assess pt,  ABG ordered   Plan of Care: as above, RN Debi to call for further assist    Event Summary: as above  MD Notified: [facility] Call [place] A, RN

## 2022-08-20 NOTE — Progress Notes (Signed)
Patient is improved, she is able to follow commands and answer some simple questions, she is complaining of severe headache, and trying to flex her legs to do the LP results in severe agitation and pain.  My concern for meningitis is quite high, and unfortunate was unable to get the LP at the bedside.  Given that she did have strep pneumo meningitis, I will add Decadron 10 mg every 6 hours.  She does have a lip lesion as well, and so I do think herpes is of concern and therefore I will start acyclovir as well.  I have also discontinued Wellbutrin.  Roland Rack, MD [facility] [phone]  If 7pm- 7am, please page neurology on call as listed in [place].

## 2022-08-20 NOTE — Progress Notes (Signed)
This morning when going to give medications to patient, patient had taken off her oxygen and was not responding, placed O2 back on patient. Called CN in room. Sternal rub done and patient started opening her eyes. Patient kept closing her eyes and hanging her head. Kept stimulating patient to keep her awake. Called Rapid Response d/t change in mental status. Patient was crying out, "Help me", "Help me", "My head hurts", "My head". Per MD to give PRN Dilaudid , gave '2mg'$ . While giving the Dilaudid the patient started to have a possible seizure, eyes rolled in back of head and she was foaming at the mouth with vigorous shaking. MD was called back to room. MD ordered '2mg'$  Ativan. Patient started agonal breathing and code blue called. Patient DNR. Attending MD and Neuro MD placed new orders.

## 2022-08-20 NOTE — Progress Notes (Signed)
Patient ID: Summer [name], female   DOB: [birth year], 71 y.o.   MRN: [id number]  Subjective: The patient is sleeping at this time so I did not wake her up.  Therefore, she cannot provide verbal feedback as to how her knee is feeling today.   Objective: Vital signs in last 24 hours: Temp:  [97.8 F (36.6 C)-98.6 F (37 C)] 97.9 F (36.6 C) (12/10 1215) Pulse Rate:  [59-107] 59 (12/10 1215) Resp:  [12-20] 19 (12/10 1215) BP: (139-169)/(52-103) 153/74 (12/10 1215) SpO2:  [99 %-100 %] 100 % (12/10 1215)  Intake/Output from previous day: 12/09 0701 - 12/10 0700 In: 600 [P.O.:600] Out: 550 [Urine:550] Intake/Output this shift: Total I/O In: -  Out: 1200 [Urine:1200]  Recent Labs    08/19/22 0356  HGB 9.6*   Recent Labs    08/19/22 0356  WBC 18.6*  RBC 3.41*  HCT 28.7*  PLT 312   Recent Labs    08/19/22 0356 08/20/22 0558  NA 136 136  K 3.5 3.3*  CL 105 106  CO2 21* 23  BUN 30* 31*  CREATININE 1.41* 1.30*  GLUCOSE 225* 222*  CALCIUM 8.7* 8.7*   No results for input(s): "LABPT", "INR" in the last 72 hours.  Physical Exam: Orthopedic examination is limited to the left knee and lower extremity.  Skin inspection around the left knee is notable for perhaps minimal residual swelling, although there does appear to be increased wrinkling around the knee as compared to yesterday, suggesting some improvement in the swelling.  She also appears to have a trace residual effusion.  Her knee is resting in near full extension at this time.  Assessment: Left knee effusion secondary to gout with underlying degenerative joint disease, somewhat improved symptomatically.  Plan: The patient may continue to be mobilized with physical therapy, weightbearing as tolerated to the left lower extremity.  She may continue to receive pain medication as deemed appropriate medically as needed.  Dr. Posey Pronto will be back in the office tomorrow and can resume her care.   Summer [name] 08/20/2022, 2:37  PM

## 2022-08-20 NOTE — Procedures (Signed)
Indication: Altered mental status and fever  Risks of the procedure were dicussed with the patient including post-LP headache, bleeding, infection, weakness/numbness of legs(radiculopathy), death.  The patient/patient's proxy agreed and written consent was obtained.   The patient was prepped and draped, and using sterile technique a 20 gauge quinke spinal needle was inserted in the L4-5 and L5-S1 spaces.  Unfortunately, despite repeated attempts, bony resistance was met each time.  This point, I think this needs to be performed under fluoroscopy, I have placed this order.  Roland Rack, MD [facility] [phone]  If 7pm- 7am, please page neurology on call as listed in [place].

## 2022-08-20 NOTE — TOC Progression Note (Signed)
Transition of Care [facility]) - Progression Note    Patient Details  Name: Summer [name] MRN: [id number] Date of Birth: 1951/07/26  Transition of Care [facility]) CM/SW [place], [place] Phone Number: 08/20/2022, 11:38 AM  Clinical Narrative:     CSW spoke with RN at [facility], stated she would have to call back after trying to reach hospital liaison to check insurance auth, while speaking this CSW noticed pt had a significant event this morning. Will wait to hear back from [facility].    Expected Discharge Plan: [place] Barriers to Discharge: Continued Medical Work up  Expected Discharge Plan and Services Expected Discharge Plan: [place] In-house Referral: Clinical Social Work   Post Acute Care Choice: [place] Living arrangements for the past 2 months: Single Family Home                                       Social Determinants of Health (SDOH) Interventions    Readmission Risk Interventions     No data to display

## 2022-08-20 NOTE — Significant Event (Signed)
Came back to check on patient and RN about 0850... walked into patient seizing and foaming at mouth... Dr Si Raider who was at desk called back to room. Code blue called for possible intubation, but pt found to be DNR/DNI, so ultimately aborted. Stat head CT ordered, and keppra load ordered by Neuro. Pt to transfer to PCU.

## 2022-08-20 NOTE — Progress Notes (Addendum)
Progress Note   Patient: Summer [name] TWS:[id number] DOB: 1951/06/23 DOA: 08/10/2022     10 DOS: the patient was seen and examined on 08/20/2022   Brief hospital course: Summer [name] is a 71 y.o. female with medical history significant for Diabetes with polyneuropathy with a chronic left foot wound previously seen at the wound care clinic, OSA, CKD stage IIIb, depression, HTN, HFpEF, class III obesity, OSA and hypothyroidism,Who presents to the ED via EMS with frequent falls over the past 3 days, now with severe left knee pain.  Upon arriving the hospital, temperature was 101.1, HR 117, creatinine 1.89.  CK level 6435.  UA abnormal, x-ray showed mild to moderate left knee joint effusion. Patient was started on broad spectrum antibiotics for possible sepsis.  Seen by orthopedics, knee tap results support diagnosis of gout.     12/1: Podiatry recommends conservative management with dressing changes and weightbearing as tolerated with surgical shoe.  Left knee aspiration by Ortho the fluid suggestive of gout.  Started on steroids 12/2: PT, OT eval recommends SNF 12/3: Waiting for SNF placement, TOC aware, CK improving.  ID consult for strep pneumo bacteremia 12/4: Not motivated to work with therapy.  Palliative care consult, 2D echo and 2 sets of blood culture per ID, starting allopurinol for gout 12/5: Ortho reeval, TEE, stopped steroids 12/6: TEE completed, negative  Assessment and Plan:  Thunderclap headache Seizure This morning rapid response for severe headache. Apparently this was called overhead and I was paged, but I was seeing patients in the ED and did not hear the overhead nor did I receive a page. When I saw secure chat regarding this I promptly presented to bedside where I found patient in distress complaining of severe headache. Vitals wnl. Neuro exam non-focal. My initial concern was for brain hemorrhage and I immediately ordered a STAT head CT. I also contacted neurology who  promptly came to bedside to evaluate.  While on the phone with CT to explain the need for immediate CT I was called back to the room for possible seizure. When I arrived the patient was not responsive and her gaze was fixed to the left. I verbally ordered ativan 2 mg IV. The patient then decompensated, with agonal breathing. Code blue was called and code team promptly arrived. Patient did not lose a pulse. Bag mask ventilation begun. I called her partner and confirmed her DNR status. We thus deferred intubation. Respiratory status slowly improved and patient was placed on non-rebreather. Glucose found to be in the 300s. Patient was brought emergently to head CT where no bleed or obvious ischemic stroke was encountered. Neuro loaded patient with keppra. Plan will be LP as there is concern for subarachnoid bleed. Meningitis also on ddx and so I have increased ceftriaxone to 2 g bid per neuro recs. Patient's sig o Darrel was updated. Lovenox discontinued (for critical care billing, 45 minutes were spent evaluating the patient, reviewing chart, treating her critical illness, and conferring with specialty providers)  * Bacteremia due to Streptococcus pneumoniae Continue IV Rocephin for now.  ID following, unclear source. TEE negative. Plan for 4 wks abx per ID  Traumatic rhabdomyolysis ([place]) Frequent falls CK over 6000-> 3308> 1958> 973 Now off IVF PT, OT recommend SNF.  TOC working on placement. Has bed, now pending insurance auth  Sepsis ([place]) 2/2 bacteremia Sepsis criteria includes fever, tachycardia, leukocytosis and AKI.  Lactic acid was normal Continue empiric Rocephin. Repeat blood cultures from 12/3 are negative thus far.  Sepsis physiology resolved  Acute pain of left knee Patient with severe 10/10 pain left knee on admission.  Seen by Ortho who did aspiration of left knee with fluid consistent with gout Treated with 3 days of oral steroids.  Still with left knee pain, now s/p steroid injection  by ortho on 12/8. started allopurinol 50 mg p.o. daily for maintenance regimen for gout  Ulcer of left foot due to type 2 diabetes mellitus ([place]) Left foot/great toe ulceration without underlying osteomyelitis Seen by podiatry.  Thought to be due to hallux malleus contracture leading to excessive stress to the distal tip of the hallux and therefore ulceration.  Continue Betadine wet-to-dry dressing, weightbearing as tolerated with surgical shoe.  No surgery plans per podiatry.  Outpatient podiatry follow-up with Dr. Amalia Hailey in 1 week post discharge Continue IV Rocephin for now; abx as above  Uncontrolled type 2 diabetes mellitus with hyperglycemia, with long-term current use of insulin ([name]) Continue insulin Semglee, sliding scale.  Last A1c about 2 months ago was 7.5  Acute renal failure superimposed on stage 3b chronic kidney disease ([place]) Secondary to sepsis and possibly rhabdo Now off fluids and hgb stable Hold metformin.  Avoid nephrotoxins  Major depressive disorder, recurrent episode, moderate ([name]) Continue bupropion, sertraline and trazodone.  (HFpEF) heart failure with preserved ejection fraction ([name]) Clinically euvolemic to dry continue metoprolol.  Holding furosemide due to AKI.  Morbid obesity with BMI of 40.0-44.9, adult ([place]) Complicating factor to overall prognosis and care.  Palliative care consult  Adult hypothyroidism Continue levothyroxine.  Obstructive sleep apnea Continue CPAP.  Mucosal bleeding Lips are scabbed. Eyes and inside of mouth normal, denies genital involvement. Severe chapped lips? Improving with emolient.  HTN Bp elevated - cont amlodipine       Subjective: complaining of severe headache  Physical Exam: Vitals:   08/20/22 0755 08/20/22 0829 08/20/22 1000 08/20/22 1215  BP: (!) 169/63 (!) 139/103 (!) 140/52 (!) 153/74  Pulse: 94 (!) 107 73 (!) 59  Resp: '16  12 19  '$ Temp: 98.5 F (36.9 C)  97.9 F (36.6 C) 97.9 F (36.6 C)  TempSrc:       SpO2: 100% 99% 100% 100%  Weight:      Height:       71 year old female sitting in bed in distress Lungs clear to auscultation bilaterally Cardiovascular regular rate and rhythm Scabs on lips, interior of mouth appears normal though dentition poor Abdomen soft, benign Extremities: mild effusion left knee Neuro alert, moving all 4 Data Reviewed:    Family Communication: sig o darrel updated telephonically and later at bedside on 12/10  Disposition: Status is: Inpatient Remains inpatient appropriate because: unsafe d/c plan  Planned Discharge Destination: Skilled nursing facility, insurance auth pending   DVT prophylaxis-SCDs   Author: Desma Maxim, MD 08/20/2022 1:45 PM  For on call review [email or web address].

## 2022-08-20 NOTE — Progress Notes (Signed)
Pharmacy Antibiotic Note  AMAI CAPPIELLO is a 71 y.o. female admitted on 08/10/2022 with Streptococcus pneumoniae bacteremia and severe left knee pain with joint effusion. New headache 12/10. There is concern for possible  herpes encephalitis . Pharmacy has been consulted for acyclovir dosing.  Plan: Acyclovir 740 mg (10 mg/kg) every 12 hours Used adjusted body weight (IBW 219%X IBW)  Also on ceftriaxone 2 grams every 12 hours (dosing frequency changed for indication)  Follow up renal function and LP results.  Height: '5\' 2"'$  (157.5 cm) Weight: 109.3 kg (241 lb) IBW/kg (Calculated) : 50.1  Temp (24hrs), Avg:98.2 F (36.8 C), Min:97.8 F (36.6 C), Max:98.6 F (37 C)  Recent Labs  Lab 08/14/22 0400 08/15/22 0534 08/16/22 0510 08/17/22 0530 08/19/22 0356 08/20/22 0558  WBC 15.2* 14.9* 15.0* 16.0* 18.6*  --   CREATININE 1.93* 1.80* 1.71* 1.69* 1.41* 1.30*    Estimated Creatinine Clearance: 44.9 mL/min (A) (by C-G formula based on SCr of 1.3 mg/dL (H)).    Allergies  Allergen Reactions   Codeine Other (See Comments)    "TRIPPED OUT"  DIDN'T LIKE THE WAY IT FELT   Atorvastatin     muscle pain   Hydrocodone     itching   Tramadol    Latex Rash   Zolpidem Other (See Comments)    Sleep walk    Antimicrobials this admission: Ceftriaxone 2 g Q24 11/30 >> 12/10 Ceftriaxone 2 g Q12 12/10 >>  Acyclovir 12/10 >>  Dose adjustments this admission: N/a  Microbiology results: 11/30 BCx: S. pneumoniae    Thank you for allowing pharmacy to be a part of this patient's care.   Glean Salvo, PharmD, BCPS Clinical Pharmacist  08/20/2022 4:21 PM

## 2022-08-20 NOTE — Progress Notes (Signed)
This afternoon patient was becoming more alert and crying out, "Help me", again. She was c/o head pain as well. Arms flailing. Made MD aware and gave PRN Dilaudid.

## 2022-08-20 NOTE — Progress Notes (Signed)
   08/20/22 0900  Clinical Encounter Type  Visited With Health care provider  Visit Type Code   Chaplain responded to Code Blue. NO family present. Family services offered if and when family arrives.

## 2022-08-21 ENCOUNTER — Inpatient Hospital Stay: Payer: No Typology Code available for payment source

## 2022-08-21 DIAGNOSIS — I6783 Posterior reversible encephalopathy syndrome: Secondary | ICD-10-CM

## 2022-08-21 DIAGNOSIS — R569 Unspecified convulsions: Secondary | ICD-10-CM

## 2022-08-21 DIAGNOSIS — R7881 Bacteremia: Secondary | ICD-10-CM | POA: Diagnosis not present

## 2022-08-21 DIAGNOSIS — B953 Streptococcus pneumoniae as the cause of diseases classified elsewhere: Secondary | ICD-10-CM | POA: Diagnosis not present

## 2022-08-21 DIAGNOSIS — M109 Gout, unspecified: Secondary | ICD-10-CM | POA: Diagnosis not present

## 2022-08-21 LAB — BASIC METABOLIC PANEL
Anion gap: 10 (ref 5–15)
BUN: 30 mg/dL — ABNORMAL HIGH (ref 8–23)
CO2: 22 mmol/L (ref 22–32)
Calcium: 8.7 mg/dL — ABNORMAL LOW (ref 8.9–10.3)
Chloride: 107 mmol/L (ref 98–111)
Creatinine, Ser: 1.17 mg/dL — ABNORMAL HIGH (ref 0.44–1.00)
GFR, Estimated: 49 mL/min — ABNORMAL LOW (ref >60)
Glucose, Bld: 248 mg/dL — ABNORMAL HIGH (ref 70–99)
Potassium: 4 mmol/L (ref 3.5–5.1)
Sodium: 139 mmol/L (ref 135–145)

## 2022-08-21 LAB — GLUCOSE, CAPILLARY
Glucose-Capillary: 252 mg/dL — ABNORMAL HIGH (ref 70–99)
Glucose-Capillary: 311 mg/dL — ABNORMAL HIGH (ref 70–99)
Glucose-Capillary: 220 mg/dL — ABNORMAL HIGH (ref 70–99)
Glucose-Capillary: 234 mg/dL — ABNORMAL HIGH (ref 70–99)

## 2022-08-21 LAB — BLOOD GAS, ARTERIAL
Acid-base deficit: 3.6 mmol/L — ABNORMAL HIGH (ref 0.0–2.0)
Bicarbonate: 20.1 mmol/L (ref 20.0–28.0)
O2 Content: 2 L/min
O2 Saturation: 99.1 %
Patient temperature: 37
pCO2 arterial: 31 mmHg — ABNORMAL LOW (ref 32–48)
pH, Arterial: 7.42 (ref 7.35–7.45)
pO2, Arterial: 92 mmHg (ref 83–108)

## 2022-08-21 MED ORDER — ORAL CARE MOUTH RINSE
15.0000 mL | OROMUCOSAL | Status: DC
  Administered 2022-08-21 – 2022-08-22 (×2): 15 mL via OROMUCOSAL

## 2022-08-21 MED ORDER — INSULIN GLARGINE-YFGN 100 UNIT/ML ~~LOC~~ SOLN
18.0000 [IU] | Freq: Every day | SUBCUTANEOUS | Status: DC
  Administered 2022-08-21: 18 [IU] via SUBCUTANEOUS
  Filled 2022-08-21: qty 0.18

## 2022-08-21 MED ORDER — HYDRALAZINE HCL 20 MG/ML IJ SOLN: 10.0000 mg | INTRAMUSCULAR | Status: DC | PRN

## 2022-08-21 MED ORDER — HYDRALAZINE HCL 20 MG/ML IJ SOLN
10.0000 mg | INTRAMUSCULAR | Status: DC | PRN
  Administered 2022-08-21 – 2022-08-22 (×4): 10 mg via INTRAVENOUS
  Filled 2022-08-21 (×4): qty 1

## 2022-08-21 MED ORDER — ORAL CARE MOUTH RINSE: 15.0000 mL | OROMUCOSAL | Status: DC | PRN

## 2022-08-21 MED ORDER — LIDOCAINE HCL (PF) 1 % IJ SOLN
10.0000 mL | Freq: Once | INTRAMUSCULAR | Status: AC
  Administered 2022-08-21: 10 mL

## 2022-08-21 MED ORDER — LORAZEPAM 2 MG/ML IJ SOLN
2.0000 mg | Freq: Once | INTRAMUSCULAR | Status: AC | PRN
  Administered 2022-08-22: 2 mg via INTRAVENOUS
  Filled 2022-08-21: qty 1

## 2022-08-21 MED ORDER — HYDROCHLOROTHIAZIDE 25 MG PO TABS
25.0000 mg | ORAL_TABLET | Freq: Every day | ORAL | Status: DC
  Administered 2022-08-21 – 2022-08-25 (×5): 25 mg via ORAL
  Filled 2022-08-21 (×5): qty 1

## 2022-08-21 NOTE — Progress Notes (Signed)
OT Cancellation Note  Patient Details Name: Summer [name] MRN: [id number] DOB: 01-May-1951   Cancelled Treatment:    Reason Eval/Treat Not Completed: Patient at procedure or test/ unavailable. Pt leaving floor for LP this AM; will attempt OT at a later time/date, as pt is available and medically appropriate.   Josiah Lobo 08/21/2022, 11:09 AM

## 2022-08-21 NOTE — Progress Notes (Signed)
Eeg done 

## 2022-08-21 NOTE — Progress Notes (Signed)
ID Yesterday pt had severe headache and was crying for help and given dilaudid . She then had seizure , agonal bretahing, code blue but not pulseless, given ativan 2 mg. She was not intubated because of DNR. Neuro saw her and there was a concern for strep pneumo meningitis VS herpes encephalitis as she had cold sore lips She was started on acyclovir encephalitis dose , Ceftriaxone was increase dot Q12 and decadron was added- LP was attempted but could not be done- <MRI brain afterwards showed changes of PRES. This morning pt had improved but not baseline Bit when I saw her in the afternoon she was awake and alert Said she was feeling better than before Followed commands    O/e awake and alert and oriented to place , person, and knows why she is the hospital and is comfortable- has no headache Patient Vitals for the past 24 hrs:  BP Temp Temp src Pulse Resp SpO2  08/21/22 1221 (!) 158/76 98.1 F (36.7 C) -- 93 18 99 %  08/21/22 1000 (!) 151/68 98.5 F (36.9 C) Oral -- -- --  08/21/22 0721 (!) 128/91 (!) 97.5 F (36.4 C) Axillary 88 -- 100 %  08/21/22 0506 (!) 169/74 98.2 F (36.8 C) Oral 84 18 100 %  08/21/22 0029 (!) 159/71 97.6 F (36.4 C) -- 73 16 100 %  08/20/22 2123 (!) 171/72 98.4 F (36.9 C) Oral 75 18 100 %   No neck rigidity Upper lip- crust ( present since admission and it is a cold sore) Chest b/l air entry Hs s1s2 Abd soft Left knee minimal swelling Some movt Left foot- lateral ulcer and great toe callus/ulcer CNS non focal  Labs    Latest Ref Rng & Units 08/19/2022    3:56 AM 08/17/2022    5:30 AM 08/16/2022    5:10 AM  CBC  WBC 4.0 - 10.5 K/uL 18.6  16.0  15.0   Hemoglobin 12.0 - 15.0 g/dL 9.6  10.6  10.6   Hematocrit 36.0 - 46.0 % 28.7  32.4  32.4   Platelets 150 - 400 K/uL 312  273  260        Latest Ref Rng & Units 08/21/2022    5:02 AM 08/20/2022    5:58 AM 08/19/2022    3:56 AM  CMP  Glucose 70 - 99 mg/dL 248  222  225   BUN 8 - 23 mg/dL '30  31  30    '$ Creatinine 0.44 - 1.00 mg/dL 1.17  1.30  1.41   Sodium 135 - 145 mmol/L 139  136  136   Potassium 3.5 - 5.1 mmol/L 4.0  3.3  3.5   Chloride 98 - 111 mmol/L 107  106  105   CO2 22 - 32 mmol/L '22  23  21   '$ Calcium 8.9 - 10.3 mg/dL 8.7  8.7  8.7     Micro 11/30 BC Strep pneumo 12/1 BC- NG Left foot wound- Ng so far  Impression/recommendation   ?71 yr female presenting with multiple falls And Left knee pain for the past 3 daysl  Acute onset of headache and seizure yesterday and was encephalopathic- recovered today This is very Likely due to PRES as seen on MRI Doubt this is herpes encephalitis  Strep pneumo meninigits is also less likely but okay to get LP , as we could not find a clear source   Strep pneumo bacteremia Source currently not clear-  left foot wound is MSSA Concern for  left knee secondary infection Underwent aspiration of the knee after a dose of ceftriaxone and vanco- Monosodium urate crystals in the fluid with cell count of > 25K- culture neg so far Being treated like acute gout -She - received  3 days of prednisone - there was preliminary improvement in the pain, but now not so much Cannot say for sure that she no infection in the knee- but culture neg, and because she continues to have severe knee pain and Opioids are making her restless and confused we could try intra articular steroids as recommended earlier on by Dr.Patel- She is on antibiotics and hence fear of acute flare up of knee with infection is less likely will observe closely  TEE negative for  endocarditis Continue ceftriaxone- duration total 4 weeks   Diabetes mellitus with neuropathy, charcot foot and wound left foot X 2 The left lateral foot wound appears to be infected, where as the great toie wound looks chronic, but culture has been negative   Rhabdomyolysis- due to fall- resolved   AKI   Charcot foot- H/o left Ankle surgey- has hardware       DM  Discussed the management with patient  and  care team

## 2022-08-21 NOTE — Procedures (Signed)
Routine EEG Report  Summer [name] is a 71 y.o. female with a history of seizure who is undergoing an EEG to evaluate for seizures.  Report: This EEG was acquired with electrodes placed according to the International 10-20 electrode system (including Fp1, Fp2, F3, F4, C3, C4, P3, P4, O1, O2, T3, T4, T5, T6, A1, A2, Fz, Cz, Pz). The following electrodes were missing or displaced: none.  The occipital dominant rhythm was 6 Hz with intermittent diffuse slowing and continuous right focal slowing. This activity is reactive to stimulation. Drowsiness was manifested by background fragmentation; deeper stages of sleep were identified by K complexes and sleep spindles. There were no interictal epileptiform discharges. There were no electrographic seizures identified. Photic stimulation and hyperventilation were not performed.   Impression and clinical correlation: This EEG was obtained while awake and asleep and is abnormal due to mild-to-moderate diffuse slowing indicative of global cerebral dysfunction as well as focal slowing over the right hemisphere indicating focal cerebral dysfunction in that region. Epileptiform abnormalities were not seen during this recording.  Su Monks, MD [facility] [phone]  If 7pm- 7am, please page neurology on call as listed in [place].

## 2022-08-21 NOTE — Progress Notes (Signed)
Pt's mentation has improved over the night. Speech is clear. However she is still unable to take anything by mouth. Continues to C/O head pain which is relieved by Dilaudid. O2 decreased to 2L via [place] oxygen saturation is 100%. Pt remains on continuous pulse ox along with telemetry. Pt is currently resting with eyes closed.

## 2022-08-21 NOTE — Evaluation (Signed)
Clinical/Bedside Swallow Evaluation Patient Details  Name: Summer [name] MRN: [id number] Date of Birth: Apr 25, 1951  Today's Date: 08/21/2022 Time: SLP Start Time (ACUTE ONLY): 1215 SLP Stop Time (ACUTE ONLY): 1255 SLP Time Calculation (min) (ACUTE ONLY): 40 min  Past Medical History:  Past Medical History:  Diagnosis Date   Anemia    Arthritis    Asthma    B12 deficiency    Back pain    Carpal tunnel syndrome    Chronic kidney disease    Constipation    Depression    Depressive disorder    Diabetes mellitus    Dyspnea    Fluid retention    Foot pain    GERD (gastroesophageal reflux disease)    Headache    History of hiatal hernia    Hyperlipidemia    Hypertension    IBS (irritable bowel syndrome)    Insomnia    Joint pain    Lumbago    Memory loss    Obesity    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)    Pneumonia    Rhinitis, allergic    Rosacea    Sleep apnea    SOB (shortness of breath)    Thyroid disease    Unspecified hearing loss    Unspecified hereditary and idiopathic peripheral neuropathy    Unspecified sleep apnea    Vitamin D deficiency    Past Surgical History:  Past Surgical History:  Procedure Laterality Date   ABDOMINAL HYSTERECTOMY  1975   ANKLE SURGERY Left approx Jan 2018   [place]   CATARACT EXTRACTION  01/2011   right   COLONOSCOPY WITH PROPOFOL N/A 08/19/2019   Procedure: COLONOSCOPY WITH PROPOFOL;  Surgeon: Jonathon Bellows, MD;  Location: [facility] ENDOSCOPY;  Service: Gastroenterology;  Laterality: N/A;   eye lid surgery  2013   bilateral   FOOT SURGERY     LOWER EXTREMITY ANGIOGRAPHY Left 11/23/2020   Procedure: LOWER EXTREMITY ANGIOGRAPHY;  Surgeon: Katha Cabal, MD;  Location: [facility];  Service: Cardiovascular;  Laterality: Left;   NECK SURGERY     SPINE SURGERY     TEE WITHOUT CARDIOVERSION N/A 08/16/2022   Procedure: TRANSESOPHAGEAL ECHOCARDIOGRAM (TEE);  Surgeon: Kate Sable, MD;  Location: [facility] ORS;  Service:  Cardiovascular;  Laterality: N/A;   TUBAL LIGATION     VAGINAL HYSTERECTOMY  1989   HPI:  Pt  is a 71 y.o. female with medical history significant for Seizure, Major Depressive Dis., Diabetes with polyneuropathy with a chronic left foot wound previously seen at the wound care clinic, OSA, CKD stage IIIb, depression, HTN, HFpEF, class III Obesity, OSA and hypothyroidism,Who presents to the ED via EMS with frequent falls over the past 3 days, now with severe left knee pain.  Following the fall yesterday, patient laid on the floor for up to an hour as it was difficult to get her up.  She denied preceding lightheadedness, shortness of breath, chest pain, one-sided weakness numbness or tingling.  She denies cough or shortness of breath.  Has had no recent nausea, vomiting, diarrhea or abdominal pain or dysuria.  Pt has had a complicated admit; concern for Neurological issues during this admit.  Neurology following; EEG was obtained "while awake and asleep and is abnormal due to mild-to-moderate diffuse slowing indicative of global cerebral dysfunction as well as focal slowing over the right hemisphere indicating focal cerebral dysfunction in that region.".   MRI: Scattered patchy T2/FLAIR signal abnormality involving the  cortical to subcortical  aspects of both frontal, parietal, and  occipital lobes, most characteristic of PRES. No associated  hemorrhage or other complicating features.  CXR: Possible patchy airspace opacities at the lung bases that are only  seen on the lateral view.    Assessment / Plan / Recommendation  Clinical Impression   Pt seen for BSE today. Pt awake, tangential(min); mild confusion/mental status decline. C/o Pain -- NSG informed who stated pt had been given Meds already. Pt was verbal and could follow basic instructions w/ cues. She seemed to perseverate on being "thirsty". Unsure of pt's Baseline Cognitive status in setting of Seizure activity and "Memory Loss" per chart notes and  H&P.    Pt appears to present w/ grossly adequate oropharyngeal phase swallow function w/ No oropharyngeal phase dysphagia noted w/ trials of liquids, No neuromuscular deficits noted. Pt consumed po trials of liquids w/ No immediate, overt, clinical s/s of aspiration during po trials. Pt appears at reduced risk for aspiration following general aspiration precautions and support w/ setup for oral intake. However, pt does have challenging factors that could impact her oropharyngeal swallowing to include Pain/discomfort(NSG aware), fatigue/weakness, Missing Dentition, and recent decline in mental status w/ lengthy admit currently. Pt also appears to have some mental status decline/confusion; noted per MD notes also -- ongoing assessments to determine issues/causes. Pt also has Seizures. These factors can increase risk for aspiration, dysphagia as well as decreased oral intake overall.   During po trials, Distractions in room reduced. Pt consumed thin liquid consistencies w/ no overt coughing, decline in vocal quality, or change in respiratory presentation during/post trials. No decline in O2 sats. Oral phase appeared grossly [facility] w/ timely bolus management and control of bolus propulsion for A-P transfer for swallowing. Oral clearing achieved w/ all trial consistencies.  OM Exam appeared [facility] w/ no unilateral weakness noted, however, noted min lingual fullness and bluish/bruised areas of the tongue. Speech Clear. Pt fed self w/ setup support. Pt did Not report any soreness; just "no appetite".   Recommend continued the MD ordered (fairly) regular consistency diet but one of MECH SOFT CONSISTENCY w/ well-Cut meats d/t Missing Dentition, moistened foods; Thin liquids. Recommend general aspiration and REFLUX precautions, Pills CRUSHED in Puree for safer, easier swallowing.  Education given on Pills in Puree; food consistencies and easy to eat options; general aspiration precautions to pt. NSG to reconsult if any  new needs arise. NSG updated, agreed. MD updated. Recommend Dietician f/u for support. SLP Visit Diagnosis: Dysphagia, unspecified (R13.10) (potential mild confusion currently)    Aspiration Risk   (reduced following general aspiration precautions)    Diet Recommendation   MECH SOFT CONSISTENCY w/ well-Cut meats d/t Missing Dentition, moistened foods; Thin liquids. Recommend general aspiration and REFLUX precautions. Tray setup at meals. Reduce Distractions at meals.   Medication Administration: Crushed with puree    Other  Recommendations Recommended Consults:  (Dietician f/u) Oral Care Recommendations: Oral care BID;Oral care before and after PO;Staff/trained caregiver to provide oral care (support) Other Recommendations:  (n/a)    Recommendations for follow up therapy are one component of a multi-disciplinary discharge planning process, led by the attending physician.  Recommendations may be updated based on patient status, additional functional criteria and insurance authorization.  Follow up Recommendations No SLP follow up      Assistance Recommended at Discharge  Supervision at meals at D/C  Functional Status Assessment Patient has had a recent decline in their functional status and demonstrates the ability to make significant improvements  in function in a reasonable and predictable amount of time.  Frequency and Duration  (n/a)   (n/a)       Prognosis Prognosis for Safe Diet Advancement: Good Barriers to Reach Goals: Time post onset;Severity of deficits (lengthy admit currently)      Swallow Study   General Date of Onset: 08/10/22 HPI: Pt  is a 71 y.o. female with medical history significant for Seizure, Major Depressive Dis., Diabetes with polyneuropathy with a chronic left foot wound previously seen at the wound care clinic, OSA, CKD stage IIIb, depression, HTN, HFpEF, class III Obesity, OSA and hypothyroidism,Who presents to the ED via EMS with frequent falls over the  past 3 days, now with severe left knee pain.  Following the fall yesterday, patient laid on the floor for up to an hour as it was difficult to get her up.  She denied preceding lightheadedness, shortness of breath, chest pain, one-sided weakness numbness or tingling.  She denies cough or shortness of breath.  Has had no recent nausea, vomiting, diarrhea or abdominal pain or dysuria.  Pt has had a complicated admit; concern for Neurological issues during this admit.  Neurology following; EEG was obtained "while awake and asleep and is abnormal due to mild-to-moderate diffuse slowing indicative of global cerebral dysfunction as well as focal slowing over the right hemisphere indicating focal cerebral dysfunction in that region.".   MRI: Scattered patchy T2/FLAIR signal abnormality involving the  cortical to subcortical aspects of both frontal, parietal, and  occipital lobes, most characteristic of PRES. No associated  hemorrhage or other complicating features.  CXR: Possible patchy airspace opacities at the lung bases that are only  seen on the lateral view. Type of Study: Bedside Swallow Evaluation Previous Swallow Assessment: none Diet Prior to this Study: Regular Temperature Spikes Noted: No (WBC elevated) Respiratory Status: Nasal cannula (2L) History of Recent Intubation: No Behavior/Cognition: Alert;Cooperative;Pleasant mood;Distractible;Requires cueing (tangential (mild); distracted) Oral Cavity Assessment: Edema;Erythema (tongue blue/black in areas - NSG informed) Oral Care Completed by SLP: Yes Oral Cavity - Dentition: Poor condition;Missing dentition Vision: Functional for self-feeding Self-Feeding Abilities: Able to feed self;Needs assist;Needs set up Patient Positioning: Upright in bed (needed positioning) Baseline Vocal Quality: Normal (gravely) Volitional Cough: Strong Volitional Swallow: Able to elicit    Oral/Motor/Sensory Function Overall Oral Motor/Sensory Function: Within  functional limits (no unilateral weakness noted)   Ice Chips Ice chips: Within functional limits Presentation: Spoon (fed; 3 trials)   Thin Liquid Thin Liquid: Within functional limits Presentation: Self Fed;Straw (~10 ozs)    Nectar Thick Nectar Thick Liquid: Not tested   Honey Thick Honey Thick Liquid: Not tested   Puree Puree: Not tested   Solid     Solid: Not tested        Orinda Kenner, MS, CCC-SLP Speech Language Pathologist Rehab Services; [place] [phone] (ascom) Ruthy Forry 08/21/2022,4:58 PM

## 2022-08-21 NOTE — Plan of Care (Signed)
  Problem: Education: Goal: Ability to describe self-care measures that may prevent or decrease complications (Diabetes Survival Skills Education) will improve Outcome: Not Progressing Goal: Individualized Educational Video(s) Outcome: Not Progressing   Problem: Coping: Goal: Ability to adjust to condition or change in health will improve Outcome: Not Progressing   Problem: Fluid Volume: Goal: Ability to maintain a balanced intake and output will improve Outcome: Not Progressing   Problem: Health Behavior/Discharge Planning: Goal: Ability to identify and utilize available resources and services will improve Outcome: Not Progressing Goal: Ability to manage health-related needs will improve Outcome: Not Progressing   Problem: Metabolic: Goal: Ability to maintain appropriate glucose levels will improve Outcome: Not Progressing   Problem: Nutritional: Goal: Maintenance of adequate nutrition will improve Outcome: Not Progressing Goal: Progress toward achieving an optimal weight will improve Outcome: Not Progressing   Problem: Skin Integrity: Goal: Risk for impaired skin integrity will decrease Outcome: Not Progressing   Problem: Tissue Perfusion: Goal: Adequacy of tissue perfusion will improve Outcome: Not Progressing   Problem: Fluid Volume: Goal: Hemodynamic stability will improve Outcome: Not Progressing   Problem: Clinical Measurements: Goal: Diagnostic test results will improve Outcome: Not Progressing Goal: Signs and symptoms of infection will decrease Outcome: Not Progressing   Problem: Education: Goal: Knowledge of General Education information will improve Description: Including pain rating scale, medication(s)/side effects and non-pharmacologic comfort measures Outcome: Not Progressing   Problem: Health Behavior/Discharge Planning: Goal: Ability to manage health-related needs will improve Outcome: Not Progressing   Problem: Clinical Measurements: Goal:  Ability to maintain clinical measurements within normal limits will improve Outcome: Not Progressing Goal: Will remain free from infection Outcome: Not Progressing Goal: Diagnostic test results will improve Outcome: Not Progressing Goal: Respiratory complications will improve Outcome: Not Progressing Goal: Cardiovascular complication will be avoided Outcome: Not Progressing   Problem: Activity: Goal: Risk for activity intolerance will decrease Outcome: Not Progressing   Problem: Nutrition: Goal: Adequate nutrition will be maintained Outcome: Not Progressing   Problem: Coping: Goal: Level of anxiety will decrease Outcome: Not Progressing   Problem: Elimination: Goal: Will not experience complications related to bowel motility Outcome: Not Progressing Goal: Will not experience complications related to urinary retention Outcome: Not Progressing   Problem: Pain Managment: Goal: General experience of comfort will improve Outcome: Not Progressing   Problem: Safety: Goal: Ability to remain free from injury will improve Outcome: Not Progressing   Problem: Skin Integrity: Goal: Risk for impaired skin integrity will decrease Outcome: Not Progressing   Problem: Clinical Measurements: Goal: Signs and symptoms of infection will decrease Outcome: Not Progressing

## 2022-08-21 NOTE — Procedures (Signed)
Attempted lumbar puncture today. Patient was quite agitated from lying prone. When preparing patient for the procedure, the patient began to move and would not hold still during subcutaneous injection of 1% lidocaine. Due to concerns for patient safety, it was determined that a lumbar puncture could not be attempted today while the patient was so agitated. Dr Si Raider was informed of this, and a future attempt will be made.  Lura Em, PA-C 08/21/2022 4:04 PM

## 2022-08-21 NOTE — Progress Notes (Addendum)
Neurology Progress Note  Subjective: Opens eyes, states head hurts.  Data:  MRI brain, personal review  1. Scattered patchy T2/FLAIR signal abnormality involving the cortical to subcortical aspects of both frontal, parietal, and occipital lobes, most characteristic of PRES. No associated hemorrhage or other complicating features. 2. Otherwise negative brain MRI for age.  CTA/CTP no emergent finding  Exam: Vitals:   08/21/22 1000 08/21/22 1221  BP: (!) 151/68 (!) 158/76  Pulse:  93  Resp:  18  Temp: 98.5 F (36.9 C) 98.1 F (36.7 C)  SpO2:  99%   Gen: In bed, NAD Resp: non-labored breathing, no acute distress Abd: soft, nt  Neuro: MS: awake, oriented to first name only, follows simple commands intermittently CN: PERR, blinks to threat bilat, EOMI, face symmetric at rest, hearing intact to voice Motor: squeezes hands and wiggles toes bilat Sensory: SILT   Impression: 71 yo F with new onset severe headache and focal seizure in the setting of strep pneumo bacteremia. Her MRI shows PRES which may explain her sx, though given strep pneumo bacteremia and HSV oral ulcers LP is still warranted to r/o meningitis.  Recommendations: 1)) LP for cells, glucose, protein, culture, HSV, encephalitis panel 2) acyclovir and rocephin CNS dosing, will d/c decadron given findings of PRES on MRI 3) keppra '500mg'$  bid 4) EEG  Neurology will continue to follow  Su Monks, MD [facility] [phone]  If [place], please page neurology on call as listed in [place].    Su Monks, MD [facility] [phone]  If 7pm- 7am, please page neurology on call as listed in [place].  **Any copied and pasted documentation in this note was written by me in another application not billed for and pasted by me into this document.

## 2022-08-21 NOTE — Progress Notes (Addendum)
Progress Note   Patient: Summer [name] EAV:[id number] DOB: February 11, 1951 DOA: 08/10/2022     11 DOS: the patient was seen and examined on 08/21/2022   Brief hospital course: Summer [name] is a 71 y.o. female with medical history significant for Diabetes with polyneuropathy with a chronic left foot wound previously seen at the wound care clinic, OSA, CKD stage IIIb, depression, HTN, HFpEF, class III obesity, OSA and hypothyroidism,Who presents to the ED via EMS with frequent falls over the past 3 days, now with severe left knee pain.  Upon arriving the hospital, temperature was 101.1, HR 117, creatinine 1.89.  CK level 6435.  UA abnormal, x-ray showed mild to moderate left knee joint effusion. Patient was started on broad spectrum antibiotics for possible sepsis.  Seen by orthopedics, knee tap results support diagnosis of gout.     12/1: Podiatry recommends conservative management with dressing changes and weightbearing as tolerated with surgical shoe.  Left knee aspiration by Ortho the fluid suggestive of gout.  Started on steroids 12/2: PT, OT eval recommends SNF 12/3: Waiting for SNF placement, TOC aware, CK improving.  ID consult for strep pneumo bacteremia 12/4: Not motivated to work with therapy.  Palliative care consult, 2D echo and 2 sets of blood culture per ID, starting allopurinol for gout 12/5: Ortho reeval, TEE, stopped steroids 12/6: TEE completed, negative  Assessment and Plan:  Summer [name] Seizure Meningitis? PRES? Rapid response 12/10 for new severe [name], AMS.  Had seizure as well. Unsuccessful LP attempt 12/10. MRI shows possible PRES. BP mild elevation today. Remains altered with [name], hemodynamically stable. Bacteremia could be source of meningitis - continue meningitic coverage w/ ceftriaxone, decadron, acyclovir - IR LP today - neuro and ID following - SLP swallow eval  * Bacteremia due to Streptococcus pneumoniae Continue IV Rocephin for now.  ID  following, unclear source. TEE negative. Plan for 4 wks abx per ID  Traumatic rhabdomyolysis (Summer [name]) Frequent falls CK over 6000-> 3308> 1958> 973 Now off IVF PT, OT recommend SNF.  TOC working on placement. Has bed, now pending insurance auth  Sepsis (Summer [name]) 2/2 bacteremia Sepsis criteria includes fever, tachycardia, leukocytosis and AKI.  Lactic acid was normal Continue empiric Rocephin. Repeat blood cultures from 12/3 are negative thus far. Sepsis physiology resolved  Acute pain of left knee Patient with severe 10/10 pain left knee on admission.  Seen by Ortho who did aspiration of left knee with fluid consistent with gout Treated with 3 days of oral steroids.  Still with left knee pain, now s/p steroid injection by ortho on 12/8. started allopurinol 50 mg p.o. daily for maintenance regimen for gout  Ulcer of left foot due to type 2 diabetes mellitus (Summer [name]) Left foot/great toe ulceration without underlying osteomyelitis Seen by podiatry.  Thought to be due to hallux malleus contracture leading to excessive stress to the distal tip of the hallux and therefore ulceration.  Continue Betadine wet-to-dry dressing, weightbearing as tolerated with surgical shoe.  No surgery plans per podiatry.  Outpatient podiatry follow-up with Dr. Amalia [name] in 1 week post discharge Continue IV Rocephin for now; abx as above  Uncontrolled type 2 diabetes mellitus with hyperglycemia, with long-term current use of insulin (Summer [name]) Continue insulin Semglee, sliding scale.  Last A1c about 2 months ago was 7.5  Acute renal failure superimposed on stage 3b chronic kidney disease (Summer [name]) Secondary to sepsis and possibly rhabdo Now off fluids and hgb stable Hold metformin.  Avoid nephrotoxins  Major depressive disorder, recurrent episode,  moderate (Summer [name]) Continue bupropion, sertraline and trazodone.  (HFpEF) heart failure with preserved ejection fraction (Summer [name]) Clinically euvolemic to dry continue metoprolol.  Holding  furosemide due to AKI.  Morbid obesity with BMI of 40.0-44.9, adult (Summer [name]) Complicating factor to overall prognosis and care.  Palliative care consult  Adult hypothyroidism Continue levothyroxine.  Obstructive sleep apnea Continue CPAP.  Mucosal bleeding Lips are scabbed. Eyes and inside of mouth normal, denies genital involvement. Severe chapped lips? With meningitis concern, hsv? - acyclovir started 12/10  HTN Bp mild elevation this am. Goal bp systolic [id number] - cont amlodipine - add hctz - hydral prn       Subjective: complaining of moderate [name]  Physical Exam: Vitals:   08/21/22 0029 08/21/22 0506 08/21/22 0721 08/21/22 1000  BP: (!) 159/71 (!) 169/74 (!) 128/91 (!) 151/68  Pulse: 73 84 88   Resp: 16 18    Temp: 97.6 F (36.4 C) 98.2 F (36.8 C) (!) 97.5 F (36.4 C) 98.5 F (36.9 C)  TempSrc:  Oral Axillary Oral  SpO2: 100% 100% 100%   Weight:      Height:       71 year old female sitting in bed not in distress Lungs clear to auscultation bilaterally Cardiovascular regular rate and rhythm Scabs on lips, interior of mouth appears normal though dentition poor Abdomen soft, benign Extremities: mild effusion left knee Neuro awake, confused, oriented to self but not place or year, moving all 4 Data Reviewed:    Family Communication: sig o Summer [name] updated telephonically 12/11  Disposition: Status is: Inpatient Remains inpatient appropriate because: unsafe d/c plan  Planned Discharge Destination: Skilled nursing facility, insurance auth pending   DVT prophylaxis-SCDs   Author: Desma Maxim, MD 08/21/2022 10:12 AM  For on call review [email or web address].

## 2022-08-21 NOTE — Progress Notes (Signed)
PT Cancellation Note  Patient Details Name: Summer [name] MRN: [id number] DOB: [birth year]   Cancelled Treatment:    Reason Eval/Treat Not Completed: Other (comment). Pt leaving floor for LP this AM; will attempt PT at a later time/date, as pt is available and medically appropriate.     Lieutenant Diego PT, DPT 12:14 PM,08/21/22

## 2022-08-22 ENCOUNTER — Inpatient Hospital Stay: Payer: No Typology Code available for payment source

## 2022-08-22 DIAGNOSIS — M25562 Pain in left knee: Secondary | ICD-10-CM | POA: Diagnosis not present

## 2022-08-22 DIAGNOSIS — B953 Streptococcus pneumoniae as the cause of diseases classified elsewhere: Secondary | ICD-10-CM | POA: Diagnosis not present

## 2022-08-22 DIAGNOSIS — R7881 Bacteremia: Secondary | ICD-10-CM | POA: Diagnosis not present

## 2022-08-22 DIAGNOSIS — M10062 Idiopathic gout, left knee: Secondary | ICD-10-CM | POA: Diagnosis not present

## 2022-08-22 DIAGNOSIS — G9340 Encephalopathy, unspecified: Secondary | ICD-10-CM | POA: Diagnosis not present

## 2022-08-22 LAB — BASIC METABOLIC PANEL
Anion gap: 8 (ref 5–15)
BUN: 29 mg/dL — ABNORMAL HIGH (ref 8–23)
CO2: 23 mmol/L (ref 22–32)
Calcium: 8.7 mg/dL — ABNORMAL LOW (ref 8.9–10.3)
Chloride: 103 mmol/L (ref 98–111)
Creatinine, Ser: 1.24 mg/dL — ABNORMAL HIGH (ref 0.44–1.00)
GFR, Estimated: 46 mL/min — ABNORMAL LOW (ref >60)
Glucose, Bld: 304 mg/dL — ABNORMAL HIGH (ref 70–99)
Potassium: 3 mmol/L — ABNORMAL LOW (ref 3.5–5.1)
Sodium: 134 mmol/L — ABNORMAL LOW (ref 135–145)

## 2022-08-22 LAB — CBC
HCT: 27.2 % — ABNORMAL LOW (ref 36.0–46.0)
Hemoglobin: 8.9 g/dL — ABNORMAL LOW (ref 12.0–15.0)
MCH: 27.5 pg (ref 26.0–34.0)
MCHC: 32.7 g/dL (ref 30.0–36.0)
MCV: 84 fL (ref 80.0–100.0)
Platelets: 491 10*3/uL — ABNORMAL HIGH (ref 150–400)
RBC: 3.24 MIL/uL — ABNORMAL LOW (ref 3.87–5.11)
RDW: 16.5 % — ABNORMAL HIGH (ref 11.5–15.5)
WBC: 18.9 10*3/uL — ABNORMAL HIGH (ref 4.0–10.5)
nRBC: 0 % (ref 0.0–0.2)

## 2022-08-22 LAB — GLUCOSE, CAPILLARY
Glucose-Capillary: 319 mg/dL — ABNORMAL HIGH (ref 70–99)
Glucose-Capillary: 295 mg/dL — ABNORMAL HIGH (ref 70–99)
Glucose-Capillary: 192 mg/dL — ABNORMAL HIGH (ref 70–99)
Glucose-Capillary: 202 mg/dL — ABNORMAL HIGH (ref 70–99)

## 2022-08-22 LAB — MAGNESIUM: Magnesium: 1.7 mg/dL (ref 1.7–2.4)

## 2022-08-22 MED ORDER — ZINC SULFATE 220 (50 ZN) MG PO CAPS
220.0000 mg | ORAL_CAPSULE | Freq: Every day | ORAL | Status: DC
  Administered 2022-08-22 – 2022-08-28 (×7): 220 mg via ORAL
  Filled 2022-08-22 (×7): qty 1

## 2022-08-22 MED ORDER — ADULT MULTIVITAMIN W/MINERALS CH
1.0000 | ORAL_TABLET | Freq: Every day | ORAL | Status: DC
  Administered 2022-08-23 – 2022-08-28 (×6): 1 via ORAL
  Filled 2022-08-22 (×6): qty 1

## 2022-08-22 MED ORDER — LIDOCAINE HCL 1 % IJ SOLN
10.0000 mL | Freq: Once | INTRAMUSCULAR | Status: AC
  Administered 2022-08-22: 5 mL
  Filled 2022-08-22: qty 10

## 2022-08-22 MED ORDER — PROSOURCE PLUS PO LIQD
30.0000 mL | Freq: Three times a day (TID) | ORAL | Status: DC
  Administered 2022-08-22: 30 mL via ORAL

## 2022-08-22 MED ORDER — LABETALOL HCL 5 MG/ML IV SOLN
20.0000 mg | INTRAVENOUS | Status: DC | PRN
  Administered 2022-08-22 – 2022-08-23 (×3): 20 mg via INTRAVENOUS
  Filled 2022-08-22 (×3): qty 4

## 2022-08-22 MED ORDER — INSULIN GLARGINE-YFGN 100 UNIT/ML ~~LOC~~ SOLN
25.0000 [IU] | Freq: Every day | SUBCUTANEOUS | Status: DC
  Administered 2022-08-22 – 2022-08-24 (×3): 25 [IU] via SUBCUTANEOUS
  Filled 2022-08-22 (×3): qty 0.25

## 2022-08-22 MED ORDER — VITAMIN C 500 MG PO TABS
500.0000 mg | ORAL_TABLET | Freq: Two times a day (BID) | ORAL | Status: DC
  Administered 2022-08-23 – 2022-08-28 (×11): 500 mg via ORAL
  Filled 2022-08-22 (×10): qty 1

## 2022-08-22 MED ORDER — INSULIN ASPART 100 UNIT/ML IJ SOLN
3.0000 [IU] | Freq: Three times a day (TID) | INTRAMUSCULAR | Status: DC
  Administered 2022-08-22 – 2022-08-25 (×11): 3 [IU] via SUBCUTANEOUS
  Filled 2022-08-22 (×11): qty 1

## 2022-08-22 MED ORDER — METOPROLOL TARTRATE 50 MG PO TABS
50.0000 mg | ORAL_TABLET | Freq: Two times a day (BID) | ORAL | Status: DC
  Administered 2022-08-22 – 2022-08-28 (×12): 50 mg via ORAL
  Filled 2022-08-22 (×12): qty 1

## 2022-08-22 MED ORDER — INSULIN GLARGINE-YFGN 100 UNIT/ML ~~LOC~~ SOLN
10.0000 [IU] | Freq: Once | SUBCUTANEOUS | Status: AC
  Administered 2022-08-22: 10 [IU] via SUBCUTANEOUS
  Filled 2022-08-22: qty 0.1

## 2022-08-22 MED ORDER — DIPHENHYDRAMINE HCL 25 MG PO CAPS
25.0000 mg | ORAL_CAPSULE | Freq: Four times a day (QID) | ORAL | Status: DC | PRN
  Administered 2022-08-22: 25 mg via ORAL
  Filled 2022-08-22: qty 1

## 2022-08-22 MED ORDER — ZINC SULFATE 220 (50 ZN) MG PO CAPS: 220.0000 mg | ORAL_CAPSULE | Freq: Every day | ORAL | Status: DC

## 2022-08-22 MED ORDER — POTASSIUM CHLORIDE CRYS ER 20 MEQ PO TBCR
60.0000 meq | EXTENDED_RELEASE_TABLET | Freq: Once | ORAL | Status: AC
  Administered 2022-08-22: 60 meq via ORAL
  Filled 2022-08-22: qty 3

## 2022-08-22 MED ORDER — MAGNESIUM SULFATE 2 GM/50ML IV SOLN
2.0000 g | Freq: Once | INTRAVENOUS | Status: AC
  Administered 2022-08-22: 2 g via INTRAVENOUS
  Filled 2022-08-22: qty 50

## 2022-08-22 NOTE — Progress Notes (Addendum)
Speech Language Pathology Treatment: Dysphagia  Patient Details Name: Summer [name] MRN: [id number] DOB: Aug 03, 1951 Today's Date: 08/22/2022 Time: 0488-8916 SLP Time Calculation (min) (ACUTE ONLY): 35 min  Assessment / Plan / Recommendation Clinical Impression  Pt seen for ongoing assessment of swallowing. She is awake/alert this morning, verbally responsive and engaged in basic conversation w/ SLP though easily distracted by her environment.  Pt is on RA; afebrile. Pt has Poor Dentition.  Pt has a Baseline of "Memory Loss" per chart list -- Cognitive decline can impact overall awareness/timing of swallow and safety during po tasks which increases risk for aspiration, choking. Pt's risk for aspiration can be reduced when following general aspiration precautions, as well as using a modified diet consistency of broken down foods d/t Poor Dentition status at baseline(broken, missing teeth). She also requires min-mod verbal/visual cues for follow through during tasks d/t quick distraction w/ her Left foot discomfort as well as her environment.  Pt required MOD assistance w/ sitting upright in bed -- 1-step instructions given on supporting herself to sit upright, lean forward, push self upright/over in bed. She also exhibited difficulty gripping small items and the utensil firmly. OT informed.    Pt explained general aspiration precautions and agreed verbally to the need for following them especially sitting upright for all oral intake -- supported behind the back more for full upright sitting. Pt assisted w/ positioning d/t min weakness. She fed herself breakfast meal item of muffin w/ puree to moisten(soft solid) and thin liquids via straw. No overt clinical s/s of aspiration were noted w/ consistencies; respiratory status remained calm and unlabored, vocal quality clear b/t trials. NSG present to give Pills Crushed in Puree; the puree providing cohesion for swallowing medications. Oral phase appeared  grossly [facility] for bolus management and timely A-P transfer for swallowing; oral clearing achieved w/ trials.  OF NOTE: Much Belching noted w/ po trials.    Pt appears at reduced risk for aspriation when following general aspiration precautions and using a slightly modified diet for ease of mastication secondary to Poor Dentition status. Recommend continue a mech soft/regular diet for ease of cut, soft foods w/ gravies added to moisten foods; Thin liquids. Recommend general aspiration and REFLUX precautions; Pills Crushed in Puree; tray setup and positioning assistance for meals.  ST services will sign off at this time w/ MD to reconsult if needed while admitted. NSG updated. Precautions posted at bedside. Pt again c/o "no appetite" -- discussed w/ Dietician; recommended f/u by Dietician as needed.      HPI HPI: Pt  is a 71 y.o. female with medical history significant for Seizure, Memory Loss per chart list, Major Depressive Dis., Diabetes with polyneuropathy with a chronic left foot wound previously seen at the wound care clinic, OSA, CKD stage IIIb, depression, HTN, HFpEF, class III Obesity, OSA and hypothyroidism,Who presents to the ED via EMS with frequent falls over the past 3 days, now with severe left knee pain.  Following the fall yesterday, patient laid on the floor for up to an hour as it was difficult to get her up.  She denied preceding lightheadedness, shortness of breath, chest pain, one-sided weakness numbness or tingling.  She denies cough or shortness of breath.  Has had no recent nausea, vomiting, diarrhea or abdominal pain or dysuria.  Pt has had a complicated admit; concern for Neurological issues during this admit.  Neurology following; EEG was obtained "while awake and asleep and is abnormal due to mild-to-moderate diffuse slowing indicative  of global cerebral dysfunction as well as focal slowing over the right hemisphere indicating focal cerebral dysfunction in that region.".   MRI:  Scattered patchy T2/FLAIR signal abnormality involving the  cortical to subcortical aspects of both frontal, parietal, and  occipital lobes, most characteristic of PRES. No associated  hemorrhage or other complicating features.  CXR: Possible patchy airspace opacities at the lung bases that are only  seen on the lateral view.      SLP Plan  All goals met      Recommendations for follow up therapy are one component of a multi-disciplinary discharge planning process, led by the attending physician.  Recommendations may be updated based on patient status, additional functional criteria and insurance authorization.    Recommendations  Diet recommendations: Dysphagia 3 (mechanical soft);Thin liquid Liquids provided via: Cup;Straw Medication Administration: Crushed with puree (for ease) Supervision: Patient able to self feed;Staff to assist with self feeding;Intermittent supervision to cue for compensatory strategies Compensations: Minimize environmental distractions;Slow rate;Small sips/bites;Lingual sweep for clearance of pocketing;Follow solids with liquid Postural Changes and/or Swallow Maneuvers: Out of bed for meals;Seated upright 90 degrees;Upright 30-60 min after meal                General recommendations:  (Dietician f/u) Oral Care Recommendations: Oral care BID;Oral care before and after PO;Staff/trained caregiver to provide oral care (support) Follow Up Recommendations: No SLP follow up Assistance recommended at discharge: Set up Supervision/Assistance SLP Visit Diagnosis: Dysphagia, unspecified (R13.10) (memory loss baseline; mild confusion) Plan: All goals met             Orinda Kenner, [place], [place]; [place] [phone] (ascom) Eyva Califano  08/22/2022, 11:51 AM

## 2022-08-22 NOTE — Progress Notes (Signed)
Initial Nutrition Assessment  DOCUMENTATION CODES:   Morbid obesity  INTERVENTION:   -Liberalize diet to dysphagia 3 diet for wider variety of meal selections -MVI with minerals daily -500 mg vitamin C BID -220 mg zinc sulfate daily -30 ml Prosource Plus TID, each supplement provides 100 kcals and 15 grams protein -Magic cup TID with meals, each supplement provides 290 kcal and 9 grams of protein  -Feeding assistance with meals  NUTRITION DIAGNOSIS:   Inadequate oral intake related to poor appetite as evidenced by meal completion < 25%.  GOAL:   Patient will meet greater than or equal to 90% of their needs  MONITOR:   PO intake, Supplement acceptance, Diet advancement  REASON FOR ASSESSMENT:   Rounds    ASSESSMENT:   Pt with medical history significant for Diabetes with polyneuropathy with a chronic left foot wound previously seen at the wound care clinic, OSA, CKD stage IIIb, depression, HTN, HFpEF, class III obesity, OSA and hypothyroidism,Who presents  with frequent falls now with severe left knee pain.  Pt admitted with thuderclap headache, seizures, ?meningitis, ?PRES, and encephalopathy.   11/11- s/p BSE- mechanical soft diet  Reviewed I/O's: +144 ml x 24 hours and -1.6 L since admission  UOP: 500 ml x 24 hours  Lumbar puncture unsuccessful today. Pt is on antibiotics for possible meninginitis.   Case discussed with SLP, who reports pt with very poor oral intake, stating lack of desire to eat food. Pt consumed a few bites of muffin and applesauce with SLP today. Pt with very poor dentition.   Observed lunch tray, which was untouched. Pt opened eyes when greeted, but speech was very garbled. Unable to provide history/ Noted meal completions 0%.   Reviewed wt hx; wt has been stable over the past 6 months.   Lab Results  Component Value Date   HGBA1C 7.5 06/06/2022   PTA DM medications are none.   Labs reviewed: Na: 134, K: 3, CBGS: 295-319 (inpatient  orders for glycemic control are 0-20 units insulin aspart TID with meals, 0-5 units insulin aspart daily at bedtime, 3 units insulin aspart TID with meals, and 25 units insulin glargine-yfgn daily at bedtime).    NUTRITION - FOCUSED PHYSICAL EXAM:  Flowsheet Row Most Recent Value  Orbital Region No depletion  Upper Arm Region No depletion  Thoracic and Lumbar Region No depletion  Buccal Region No depletion  Temple Region No depletion  Clavicle Bone Region No depletion  Clavicle and Acromion Bone Region No depletion  Scapular Bone Region No depletion  Dorsal Hand No depletion  Patellar Region No depletion  Anterior Thigh Region No depletion  Posterior Calf Region No depletion  Edema (RD Assessment) Mild  Hair Reviewed  Eyes Reviewed  Mouth Reviewed  Skin Reviewed  Nails Reviewed       Diet Order:   Diet Order             DIET DYS 3 Room service appropriate? Yes; Fluid consistency: Thin  Diet effective now                   EDUCATION NEEDS:   Not appropriate for education at this time  Skin:  Skin Assessment: Skin Integrity Issues: Skin Integrity Issues:: Incisions, Diabetic Ulcer Diabetic Ulcer: lt toe Incisions: lt foot  Last BM:  08/22/22 (type 5)  Height:   Ht Readings from Last 1 Encounters:  08/10/22 '5\' 2"'$  (1.575 m)    Weight:   Wt Readings from Last 1 Encounters:  08/10/22 109.3 kg    Ideal Body Weight:  50 kg  BMI:  Body mass index is 44.08 kg/m.  Estimated Nutritional Needs:   Kcal:  1550-1750  Protein:  85-100 grams  Fluid:  > 1.5 L    Loistine Chance, RD, LDN, [place] Registered Dietitian II Certified Diabetes Care and Education Specialist Please refer to [facility] for RD and/or RD on-call/weekend/after hours pager

## 2022-08-22 NOTE — Progress Notes (Signed)
Patient returned from lumbar puncture in stable condition via bed with transport

## 2022-08-22 NOTE — Progress Notes (Signed)
Occupational Therapy Treatment Patient Details Name: Summer [name] MRN: [id number] DOB: [birth year] Today's Date: 08/22/2022   History of present illness 71 y.o. female with medical history significant for Diabetes with polyneuropathy with a chronic left foot wound previously seen at the wound care clinic, OSA, CKD stage IIIb, depression, HTN, HFpEF, class III obesity, OSA and hypothyroidism,Who presents to the ED via EMS with frequent falls over the past 3 days, now with severe left knee pain.  L knee tap reveals gout.   Hospital course additionally significant for seizure activity with subsequent code blue (did not lost pulse) on 12/10 due to respiratory concerns (aborted due to DNR/DNI status per notes).   OT comments  Summer [name] has made progress compared to last week's rehab sessions. Her L knee pain is now mild-mod rather than severe and she is able to demonstrate limited ROM at L knee. She is able to perform grooming sitting in bed w/ cueing for task initiation and continuation and with increased processing time. Pt displays reduced grip strength in L hand but is able to pick up and properly open/use a range of small objects, including utensils, toothbrush, lip balm, and packaging. Pt is not oriented to time, demonstrates little awareness of situation, but is more alert and interactive than during previous sessions.     Recommendations for follow up therapy are one component of a multi-disciplinary discharge planning process, led by the attending physician.  Recommendations may be updated based on patient status, additional functional criteria and insurance authorization.    Follow Up Recommendations  Skilled nursing-short term rehab (<3 hours/day)     Assistance Recommended at Discharge Frequent or constant Supervision/Assistance  Patient can return home with the following  A lot of help with walking and/or transfers;A lot of help with bathing/dressing/bathroom;Assistance with  cooking/housework;Help with stairs or ramp for entrance   Equipment Recommendations  None recommended by OT    Recommendations for Other Services      Precautions / Restrictions Precautions Precautions: Fall Restrictions Weight Bearing Restrictions: Yes LLE Weight Bearing: Weight bearing as tolerated Other Position/Activity Restrictions: L post op shoe       Mobility Bed Mobility Overal bed mobility: Needs Assistance Bed Mobility: Rolling Rolling: Max assist, Mod assist         General bed mobility comments: Mod-Max A + ongoing cueing for repositioning/rolling in bed, repeated encouragement for task continuation    Transfers                   General transfer comment: unsafe/unable     Balance Overall balance assessment: Needs assistance   Sitting balance-Leahy Scale: Poor       Standing balance-Leahy Scale: Zero Standing balance comment: unsafe/unable                           ADL either performed or assessed with clinical judgement   ADL Overall ADL's : Needs assistance/impaired     Grooming: Wash/dry hands;Oral care;Supervision/safety;Sitting Grooming Details (indicate cue type and reason): Min VC to initiate                               General ADL Comments: Max A for LB dressing, anticipate Max A for OOB fxl mobility    Extremity/Trunk Assessment Upper Extremity Assessment Upper Extremity Assessment: Generalized weakness   Lower Extremity Assessment Lower Extremity Assessment: Generalized weakness LLE: Unable to fully  assess due to pain        Vision       Perception     Praxis      Cognition Arousal/Alertness: Awake/alert   Overall Cognitive Status: No family/caregiver present to determine baseline cognitive functioning                         Following Commands: Follows one step commands with increased time, Follows one step commands inconsistently     Problem Solving: Slow processing,  Decreased initiation, Difficulty sequencing, Requires tactile cues, Requires verbal cues General Comments: Able to say she is at hospital; unable to provide month or year        Exercises Other Exercises Other Exercises: UE and LE therex in sitting; educ re: importance of movement, OOB activity    Shoulder Instructions       General Comments      Pertinent Vitals/ Pain       Pain Assessment Faces Pain Scale: Hurts little more Pain Location: L knee Pain Descriptors / Indicators: Guarding, Aching Pain Intervention(s): Limited activity within patient's tolerance, Repositioned  Home Living                                          Prior Functioning/Environment              Frequency  Min 2X/week        Progress Toward Goals  OT Goals(current goals can now be found in the care plan section)  Progress towards OT goals: Progressing toward goals  Acute Rehab OT Goals OT Goal Formulation: With patient Time For Goal Achievement: 08/26/22 Potential to Achieve Goals: [place] Discharge plan remains appropriate;Frequency remains appropriate    Co-evaluation                 AM-PAC OT "6 Clicks" Daily Activity     Outcome Measure   Help from another person eating meals?: A Little Help from another person taking care of personal grooming?: A Little Help from another person toileting, which includes using toliet, bedpan, or urinal?: A Lot Help from another person bathing (including washing, rinsing, drying)?: A Lot Help from another person to put on and taking off regular upper body clothing?: A Little Help from another person to put on and taking off regular lower body clothing?: A Lot 6 Click Score: 15    End of Session    OT Visit Diagnosis: Unsteadiness on feet (R26.81);History of falling (Z91.81);Muscle weakness (generalized) (M62.81);Pain   Activity Tolerance Patient limited by pain;Patient tolerated treatment well   Patient Left in  bed;with call bell/phone within reach;with bed alarm set   Nurse Communication          Time: 7017-7939 OT Time Calculation (min): 12 min  Charges: OT General Charges $OT Visit: 1 Visit OT Treatments $Self Care/Home Management : 8-22 mins Josiah Lobo, PhD, MS, OTR/L 08/22/22, 12:26 PM

## 2022-08-22 NOTE — Progress Notes (Signed)
Physical Therapy Treatment Patient Details Name: Summer [name] MRN: [id number] DOB: 1950/12/30 Today's Date: 08/22/2022   History of Present Illness 71 y.o. female with medical history significant for Diabetes with polyneuropathy with a chronic left foot wound previously seen at the wound care clinic, OSA, CKD stage IIIb, depression, HTN, HFpEF, class III obesity, OSA and hypothyroidism,Who presents to the ED via EMS with frequent falls over the past 3 days, now with severe left knee pain.  L knee tap reveals gout.   Hospital course additionally significant for seizure activity with subsequent code blue (did not lost pulse) on 12/10 due to respiratory concerns (aborted due to DNR/DNI status per notes).    PT Comments    Patient appears more alert and interactive this session; follows approx 50% simple commands, but very highly distracted by both internal/external environment.  Generally requiring hand-over-hand assist and step-by-step verbal cuing for set up, sequencing, initiation and completion of all functional activities.  Bilat UE/LE strength grossly 3- to 4-/5 throughout; no focal weakness appreciated, except for L LE (limited by L knee pain).  Currently requiring mod/max assist for bed mobility and unsupported sitting balance; very limited ability and willingness to attempt any WBing through L LE.  Tolerates unsupported sitting x2-3 min and insists on return to supine (spontaneously attempting to return self).  Transitioned to chair position end of session for progressive tolerance to upright; positioned to midline, needs in reach with lights on, blinds open to promote alertness and awareness of normal day/night schedule.     Recommendations for follow up therapy are one component of a multi-disciplinary discharge planning process, led by the attending physician.  Recommendations may be updated based on patient status, additional functional criteria and insurance authorization.  Follow Up  Recommendations  Skilled nursing-short term rehab (<3 hours/day) Can patient physically be transported by private vehicle: No   Assistance Recommended at Discharge Frequent or constant Supervision/Assistance  Patient can return home with the following Two people to help with walking and/or transfers;Two people to help with bathing/dressing/bathroom;Assistance with cooking/housework;Help with stairs or ramp for entrance;Assist for transportation   Equipment Recommendations       Recommendations for Other Services       Precautions / Restrictions Precautions Precautions: Fall Restrictions Weight Bearing Restrictions: Yes LLE Weight Bearing: Weight bearing as tolerated Other Position/Activity Restrictions: L post op shoe     Mobility  Bed Mobility Overal bed mobility: Needs Assistance Bed Mobility: Supine to Sit, Sit to Supine, Rolling Rolling: Mod assist, Max assist   Supine to sit: Mod assist, Max assist Sit to supine: Max assist, +2 for physical assistance   General bed mobility comments: step by step cuing and max encouragement from therapist for optimal active participation by therapist; unsupported sitting, min/mod assist for midline and static balance (spontaneously attempting return to supine despite cuing)    Transfers                   General transfer comment: unsafe/unable    Ambulation/Gait               General Gait Details: unsafe/unable   Stairs             Wheelchair Mobility    Modified Rankin (Stroke Patients Only)       Balance Overall balance assessment: Needs assistance Sitting-balance support: No upper extremity supported, Feet supported Sitting balance-Leahy Scale: Poor Sitting balance - Comments: min/mod assist to maintain static sitting balance; very flexed, kyphotic posture  Standing balance comment: unsafe/unable                            Cognition Arousal/Alertness: Awake/alert Behavior  During Therapy: Restless Overall Cognitive Status: No family/caregiver present to determine baseline cognitive functioning                                 General Comments: Verbally alert and responsive, answers simple orientation questions; follows approx 50% simple verbal commands, but very highly distractible by both internal/external environment.        Exercises Other Exercises Other Exercises: Bilat UE strength grossly 4-/5 throughout, actively mobilizes throughout functional range, all planes; no focal weakness/asymmetry noted.  Bilat LE grossly 4-/5 to R LE, 3-/5 L LE (limited primarily by pain) Other Exercises: Rolling bilat, mod assist +2 for management of linen, gown Other Exercises: Transition to chair position in bed, dep assist and positioned to midline, to comfort. In supported sitting position, participated with light grooming/oral care tasks requring max/dep assist (hand-over-hand and step-by-step verbal cuing) from therapist to set up, follow through and completion of task.    General Comments        Pertinent Vitals/Pain Pain Assessment Pain Assessment: Faces Faces Pain Scale: Hurts even more Pain Location: L knee Pain Descriptors / Indicators: Discomfort, Grimacing, Guarding, Aching Pain Intervention(s): Limited activity within patient's tolerance, Monitored during session, Repositioned, Patient requesting pain meds-RN notified, RN gave pain meds during session    Home Living                          Prior Function            PT Goals (current goals can now be found in the care plan section) Acute Rehab PT Goals Patient Stated Goal: to rest PT Goal Formulation: With patient Time For Goal Achievement: 08/25/22 Potential to Achieve Goals: Fair Progress towards PT goals: Progressing toward goals    Frequency    Min 2X/week      PT Plan Current plan remains appropriate    Co-evaluation              AM-PAC PT "6  Clicks" Mobility   Outcome Measure  Help needed turning from your back to your side while in a flat bed without using bedrails?: A Lot Help needed moving from lying on your back to sitting on the side of a flat bed without using bedrails?: A Lot Help needed moving to and from a bed to a chair (including a wheelchair)?: Total Help needed standing up from a chair using your arms (e.g., wheelchair or bedside chair)?: Total Help needed to walk in hospital room?: Total Help needed climbing 3-5 steps with a railing? : Total 6 Click Score: 8    End of Session   Activity Tolerance: Patient limited by pain;Patient tolerated treatment well Patient left: in bed;with call bell/phone within reach;with bed alarm set (chair position in bed) Nurse Communication: Mobility status PT Visit Diagnosis: Muscle weakness (generalized) (M62.81);Difficulty in walking, not elsewhere classified (R26.2);Pain Pain - Right/Left: Left Pain - part of body: Knee     Time: 9163-8466 PT Time Calculation (min) (ACUTE ONLY): 26 min  Charges:  $Therapeutic Activity: 23-37 mins                     Rashi Giuliani H. Owens Shark, PT, DPT,  NCS 08/22/22, 11:20 AM [phone]

## 2022-08-22 NOTE — Progress Notes (Addendum)
Progress Note   Patient: Summer [name] JXB:[id number] DOB: Feb 05, 1951 DOA: 08/10/2022     12 DOS: the patient was seen and examined on 08/22/2022   Brief hospital course: Summer [name] is a 71 y.o. female with medical history significant for Diabetes with polyneuropathy with a chronic left foot wound previously seen at the wound care clinic, OSA, CKD stage IIIb, depression, HTN, HFpEF, class III obesity, OSA and hypothyroidism,Who presents to the ED via EMS with frequent falls over the past 3 days, now with severe left knee pain.  Upon arriving the hospital, temperature was 101.1, HR 117, creatinine 1.89.  CK level 6435.  UA abnormal, x-ray showed mild to moderate left knee joint effusion. Patient was started on broad spectrum antibiotics for possible sepsis.  Seen by orthopedics, knee tap results support diagnosis of gout.     12/1: Podiatry recommends conservative management with dressing changes and weightbearing as tolerated with surgical shoe.  Left knee aspiration by Ortho the fluid suggestive of gout.  Started on steroids 12/2: PT, OT eval recommends SNF 12/3: Waiting for SNF placement, TOC aware, CK improving.  ID consult for strep pneumo bacteremia 12/4: Not motivated to work with therapy.  Palliative care consult, 2D echo and 2 sets of blood culture per ID, starting allopurinol for gout 12/5: Ortho reeval, TEE, stopped steroids 12/6: TEE completed, negative  Assessment and Plan:  Thunderclap headache Seizure Meningitis? PRES? Encephalopathy  Rapid response 12/10 for new severe headache, AMS.  Had seizure as well. Unsuccessful LP attempt 12/10. MRI shows possible PRES. BP remains elevated today. Headache persists but encephalopathy resolved. Bacteremia could be source of meningitis - continue meningitic coverage w/ ceftriaxone, decadron, acyclovir - IR LP today (unsuccessful yesterday 2/2 patient combativeness) - neuro and ID following - bp control as below - holding lovenox for    * Bacteremia due to Streptococcus pneumoniae Continue IV Rocephin for now.  ID following, unclear source. TEE negative. Plan for 4 wks abx per ID  Traumatic rhabdomyolysis (Summer [name]) Frequent falls CK over 6000-> 3308> 1958> 973 Now off IVF PT, OT recommend SNF.  TOC working on placement. Has bed, now pending insurance auth  Sepsis (Summer [name]) 2/2 bacteremia Sepsis criteria includes fever, tachycardia, leukocytosis and AKI.  Lactic acid was normal Continue empiric Rocephin. Repeat blood cultures from 12/3 are negative thus far. Sepsis physiology resolved  Hypokalemia Hypomagnesemia K 3, mg 1.7 - replete  Acute pain of left knee Gout with flare Patient with severe 10/10 pain left knee on admission.  Seen by Ortho who did aspiration of left knee with fluid consistent with gout Treated with 3 days of oral steroids.  Still with left knee pain, now s/p steroid injection by ortho on 12/8. started allopurinol 50 mg p.o. daily for maintenance regimen for gout  Ulcer of left foot due to type 2 diabetes mellitus (Summer [name]) Left foot/great toe ulceration without underlying osteomyelitis Seen by podiatry.  Thought to be due to hallux malleus contracture leading to excessive stress to the distal tip of the hallux and therefore ulceration.  Continue Betadine wet-to-dry dressing, weightbearing as tolerated with surgical shoe.  No surgery plans per podiatry.  Outpatient podiatry follow-up with Summer [name] in 1 week post discharge.  Continue IV Rocephin for now; abx as above  Uncontrolled type 2 diabetes mellitus with hyperglycemia, with long-term current use of insulin (Summer [name]) Worsening hyperglycemia with steroids, will increase semglee, add mealtime  Acute renal failure superimposed on stage 3b chronic kidney disease (Summer [name]) Secondary to sepsis  and possibly rhabdo Now off fluids and hgb stable Hold metformin.  Avoid nephrotoxins  Major depressive disorder, recurrent episode, moderate (Summer [name]) Continue bupropion,  sertraline and trazodone.  (HFpEF) heart failure with preserved ejection fraction (Summer [name]) Clinically euvolemic to dry continue metoprolol.  Holding furosemide due to AKI.  Morbid obesity with BMI of 40.0-44.9, adult (Summer [name]) Complicating factor to overall prognosis and care.  Palliative care consult  Adult hypothyroidism Continue levothyroxine.  Obstructive sleep apnea Continue CPAP.  Mucosal bleeding Lips are scabbed. Eyes and inside of mouth normal, denies genital involvement. Severe chapped lips? With meningitis concern, hsv? - acyclovir started 12/10  HTN BP elevated. Goal bp systolic 784-696 - cont amlodipine, metop - added hctz - will switch from hydral to labetalol prn       Subjective: complaining of moderate headache, no confusion  Physical Exam: Vitals:   08/22/22 0034 08/22/22 0642 08/22/22 0824 08/22/22 1151  BP: (!) 149/65 (!) 151/63 (!) 160/70 (!) 171/79  Pulse: 94 73 93 83  Resp: '18 18 17 17  '$ Temp: 98.7 F (37.1 C) 98.3 F (36.8 C) 98.2 F (36.8 C) 98 F (36.7 C)  TempSrc:      SpO2: 98% 100% 96% 98%  Weight:      Height:       71 year old female sitting in bed not in distress Lungs clear to auscultation bilaterally Cardiovascular regular rate and rhythm Scabs on lips, interior of mouth appears normal though dentition poor Abdomen soft, benign Extremities: mild effusion left knee Neuro awake, alert, moving all 4 Data Reviewed:    Family Communication: sig o Summer [name] updated telephonically 12/12  Disposition: Status is: Inpatient Remains inpatient appropriate because: unsafe d/c plan  Planned Discharge Destination: Skilled nursing facility,    DVT prophylaxis-SCDs   Author: Desma Maxim, MD 08/22/2022 12:09 PM  For on call review [email or web address].

## 2022-08-22 NOTE — Progress Notes (Signed)
Patient reevaluated today. Dr. Roland Rack was able to give an L knee intraarticular corticosteroid injection on Friday, 08/18/22. Per notes from this weekend and patient symptoms today, her knee pain is still present but it feels somewhat improved.  She continues to be on antibiotics for suspected meningitis.   On my exam today, she does have range of motion from 0-50 degrees that is relatively painless, but does have increased pain with attempted flexion beyond 50 degrees. This is improvement from prior notes from this weekend. She has no significant tenderness to touch about the knee joint. There does not appear to be a large joint effusion. There is no dramatic increase in warmth compared to the other side. There is no notable erythema.  Patient felt that her knee was somewhat improved after injection. We can continue to monitor for resolution of symptoms. Low likelihood of septic arthritis.

## 2022-08-22 NOTE — Progress Notes (Signed)
Neurology Progress Note  Patient ID / pertinent hospital course: Summer [name] is a 71 y.o. female with medical history significant for Diabetes with polyneuropathy with a chronic left foot wound previously seen at the wound care clinic (MSSA on culture here), OSA, CKD stage IIIb, depression, HTN, HFpEF, class III obesity, OSA and hypothyroidism, presented to the ED on 08/10/2022 via EMS with frequent falls over the 3 day prior to admission, found to have a L knee effusion and fever with CK of 6435, started on broad spectrum Abx. 12/1 knee aspirate c/w gout for which steroids were started 12/3 ID consulted for strep pneumo bacteremia (unclear source)  12/4 allopurinol started for gout 12/5 steroids stopped 12/6 TEE negative for endocarditis 12/10 Neurology consulted for severe HA, failed bedside LP, broadened to meningitic coverage w/ ceftriaxone, decadron, acyclovir; MRI w/ c/f PRES, Wellbutrin stopped, keppra 3000 mg IV loaded 12/11 Failed IR LP due to agitation, EEG w/ intermittent diffuse slowing and focal right slowing, Keppra 500 mg BID considered but not ordered, mental status rapidly improving in the afternoon per ID team   Subjective: -Examined after lumbar puncture for which she had received Ativan -Too sleepy to participate much in examination  Exam: Current vital signs: BP (!) 157/74   Pulse 90   Temp 98.2 F (36.8 C)   Resp 16   Ht '5\' 2"'$  (1.575 m)   Wt 109.3 kg   SpO2 100%   BMI 44.08 kg/m  Vital signs in last 24 hours: Temp:  [98 F (36.7 C)-98.7 F (37.1 C)] 98.2 F (36.8 C) (12/12 1336) Pulse Rate:  [73-94] 90 (12/12 1336) Resp:  [16-18] 16 (12/12 1336) BP: (135-171)/(63-81) 157/74 (12/12 1336) SpO2:  [96 %-100 %] 100 % (12/12 1336)   Gen: In bed, comfortable  Resp: non-labored breathing, no grossly audible wheezing Head: Scabbed over ulcers on the left upper lip Cardiac: Perfusing extremities well  Abd: soft, nt Musculoskeletal: Healing scabs on bilateral  knees, no significant erythema or large effusion or calor left versus right  Neuro: MS: Sleepy, arouses briefly to repeated stimulation and follows some simple commands CN: EOMI, tongue midline, face symmetric, equal blink to light eyelash brush Motor: Slight drift of the bilateral upper extremities Sensory: Briskly withdraws right lower extremity to light tickle.  Left lower extremity bandaged and in cast with recent left knee steroid injection Coordination: Able to perform finger-to-nose bilaterally   Pertinent Labs:  Basic Metabolic Panel: Recent Labs  Lab 08/16/22 0510 08/17/22 0530 08/19/22 0356 08/20/22 0558 08/21/22 0502 08/22/22 0351  NA 137 139 136 136 139 134*  K 3.4* 3.4* 3.5 3.3* 4.0 3.0*  CL 107 107 105 106 107 103  CO2 22 22 21* '23 22 23  '$ GLUCOSE 160* 182* 225* 222* 248* 304*  BUN 44* 41* 30* 31* 30* 29*  CREATININE 1.71* 1.69* 1.41* 1.30* 1.17* 1.24*  CALCIUM 8.6* 8.7* 8.7* 8.7* 8.7* 8.7*  MG 1.7 1.7  --   --   --  1.7    CBC: Recent Labs  Lab 08/16/22 0510 08/17/22 0530 08/19/22 0356 08/22/22 0351  WBC 15.0* 16.0* 18.6* 18.9*  NEUTROABS  --   --  13.4*  --   HGB 10.6* 10.6* 9.6* 8.9*  HCT 32.4* 32.4* 28.7* 27.2*  MCV 83.7 84.4 84.2 84.0  PLT 260 273 312 491*    Coagulation Studies: No results for input(s): "LABPROT", "INR" in the last 72 hours.    Impression: Overall I agree with infectious disease that meningitis  seems less likely given the patient's rapid improvement however CSF studies pending currently and would hold off on changing antibiotic regimen until these results  For now, I think it is reasonable to hold off on antiseizure medications since she has been improving off of them without further concern for seizure activity, noting that her exam at the time of my evaluation is significantly limited by somnolence secondary to sedation for lumbar puncture.  Etiology of seizure would be provoked in the setting of PRES  Etiology of press  secondary to uncontrolled hypertension versus potentially serotonin syndrome, in this case patient was exposed to sertraline (currently taking) as well as welbutrin (stopped) and trazadone as needed (100 mg on 12/09 and 12/11 nightly).  All of these are listed are home medications with trazodone increased from 50 to 100 mg nightly in Oct 2022, sertraline started in 2020 after MRI negative tPA treated left sided weakness / nausea / dysarthria iso BP 174/73,   Recommendations: -Continue to hold Wellbutrin -Okay to continue trazodone and sertraline at this time -Will continue to hold Keppra at this time -Strict blood pressure control systolic blood pressure less than 160 -Neurology will continue to follow  Lesleigh Noe MD-PhD [facility] [phone]   Greater than 35 minutes were spent in care of this patient today, the majority at bedside

## 2022-08-22 NOTE — Progress Notes (Signed)
Patient to specials for lumbar puncture in stable condition via bed with transport.

## 2022-08-22 NOTE — Progress Notes (Incomplete)
ID Yesterday pt had severe headache and was crying for help and given dilaudid . She then had seizure , agonal bretahing, code blue but not pulseless, given ativan 2 mg. She was not intubated because of DNR. Neuro saw her and there was a concern for strep pneumo meningitis VS herpes encephalitis as she had cold sore lips She was started on acyclovir encephalitis dose , Ceftriaxone was increase dot Q12 and decadron was added- LP was attempted but could not be done- <MRI brain afterwards showed changes of PRES. This morning pt had improved but not baseline Bit when I saw her in the afternoon she was awake and alert Said she was feeling better than before Followed commands    O/e awake and alert and oriented to place , person, and knows why she is the hospital and is comfortable- has no headache Patient Vitals for the past 24 hrs:  BP Temp Temp src Pulse Resp SpO2  08/22/22 1455 -- -- -- -- -- 100 %  08/22/22 1336 (!) 157/74 98.2 F (36.8 C) -- 90 16 100 %  08/22/22 1151 (!) 171/79 98 F (36.7 C) -- 83 17 98 %  08/22/22 0824 (!) 160/70 98.2 F (36.8 C) -- 93 17 96 %  08/22/22 0642 (!) 151/63 98.3 F (36.8 C) -- 73 18 100 %  08/22/22 0034 (!) 149/65 98.7 F (37.1 C) -- 94 18 98 %  08/21/22 2141 (!) 154/64 98.1 F (36.7 C) -- 81 18 99 %  08/21/22 1700 135/81 98.4 F (36.9 C) Oral 90 16 100 %   No neck rigidity Upper lip- crust ( present since admission and it is a cold sore) Chest b/l air entry Hs s1s2 Abd soft Left knee minimal swelling Some movt Left foot- lateral ulcer and great toe callus/ulcer CNS non focal  Labs    Latest Ref Rng & Units 08/22/2022    3:51 AM 08/19/2022    3:56 AM 08/17/2022    5:30 AM  CBC  WBC 4.0 - 10.5 K/uL 18.9  18.6  16.0   Hemoglobin 12.0 - 15.0 g/dL 8.9  9.6  10.6   Hematocrit 36.0 - 46.0 % 27.2  28.7  32.4   Platelets 150 - 400 K/uL 491  312  273        Latest Ref Rng & Units 08/22/2022    3:51 AM 08/21/2022    5:02 AM 08/20/2022    5:58  AM  CMP  Glucose 70 - 99 mg/dL 304  248  222   BUN 8 - 23 mg/dL '29  30  31   '$ Creatinine 0.44 - 1.00 mg/dL 1.24  1.17  1.30   Sodium 135 - 145 mmol/L 134  139  136   Potassium 3.5 - 5.1 mmol/L 3.0  4.0  3.3   Chloride 98 - 111 mmol/L 103  107  106   CO2 22 - 32 mmol/L '23  22  23   '$ Calcium 8.9 - 10.3 mg/dL 8.7  8.7  8.7     Micro 11/30 BC Strep pneumo 12/1 BC- NG Left foot wound- Ng so far  Impression/recommendation   ?71 yr female presenting with multiple falls And Left knee pain for the past 3 daysl  Acute onset of headache and seizure yesterday and was encephalopathic- recovered today This is very Likely due to PRES as seen on MRI Doubt this is herpes encephalitis  Strep pneumo meninigits is also less likely but okay to get LP , as we  could not find a clear source   Strep pneumo bacteremia Source currently not clear-  left foot wound is MSSA Concern for left knee secondary infection Underwent aspiration of the knee after a dose of ceftriaxone and vanco- Monosodium urate crystals in the fluid with cell count of > 25K- culture neg so far Being treated like acute gout -She - received  3 days of prednisone - there was preliminary improvement in the pain, but now not so much Cannot say for sure that she no infection in the knee- but culture neg, and because she continues to have severe knee pain and Opioids are making her restless and confused we could try intra articular steroids as recommended earlier on by Dr.Patel- She is on antibiotics and hence fear of acute flare up of knee with infection is less likely will observe closely  TEE negative for  endocarditis Continue ceftriaxone- duration total 4 weeks   Diabetes mellitus with neuropathy, charcot foot and wound left foot X 2 The left lateral foot wound appears to be infected, where as the great toie wound looks chronic, but culture has been negative   Rhabdomyolysis- due to fall- resolved   AKI   Charcot foot- H/o left  Ankle surgey- has hardware       DM  Discussed the management with patient and  care team

## 2022-08-23 ENCOUNTER — Telehealth: Payer: Self-pay | Admitting: Family Medicine

## 2022-08-23 DIAGNOSIS — M00162 Pneumococcal arthritis, left knee: Secondary | ICD-10-CM | POA: Diagnosis not present

## 2022-08-23 DIAGNOSIS — B953 Streptococcus pneumoniae as the cause of diseases classified elsewhere: Secondary | ICD-10-CM | POA: Diagnosis not present

## 2022-08-23 DIAGNOSIS — R7881 Bacteremia: Secondary | ICD-10-CM | POA: Diagnosis not present

## 2022-08-23 DIAGNOSIS — N179 Acute kidney failure, unspecified: Secondary | ICD-10-CM | POA: Diagnosis not present

## 2022-08-23 DIAGNOSIS — M109 Gout, unspecified: Secondary | ICD-10-CM | POA: Diagnosis not present

## 2022-08-23 DIAGNOSIS — I6783 Posterior reversible encephalopathy syndrome: Secondary | ICD-10-CM | POA: Diagnosis not present

## 2022-08-23 DIAGNOSIS — L97529 Non-pressure chronic ulcer of other part of left foot with unspecified severity: Secondary | ICD-10-CM | POA: Diagnosis not present

## 2022-08-23 DIAGNOSIS — R569 Unspecified convulsions: Secondary | ICD-10-CM | POA: Diagnosis not present

## 2022-08-23 LAB — CBC
HCT: 30 % — ABNORMAL LOW (ref 36.0–46.0)
Hemoglobin: 10 g/dL — ABNORMAL LOW (ref 12.0–15.0)
MCH: 28 pg (ref 26.0–34.0)
MCHC: 33.3 g/dL (ref 30.0–36.0)
MCV: 84 fL (ref 80.0–100.0)
Platelets: 477 10*3/uL — ABNORMAL HIGH (ref 150–400)
RBC: 3.57 MIL/uL — ABNORMAL LOW (ref 3.87–5.11)
RDW: 16.4 % — ABNORMAL HIGH (ref 11.5–15.5)
WBC: 16.3 10*3/uL — ABNORMAL HIGH (ref 4.0–10.5)
nRBC: 0 % (ref 0.0–0.2)

## 2022-08-23 LAB — BASIC METABOLIC PANEL
Anion gap: 7 (ref 5–15)
BUN: 25 mg/dL — ABNORMAL HIGH (ref 8–23)
CO2: 25 mmol/L (ref 22–32)
Calcium: 8.9 mg/dL (ref 8.9–10.3)
Chloride: 107 mmol/L (ref 98–111)
Creatinine, Ser: 1.14 mg/dL — ABNORMAL HIGH (ref 0.44–1.00)
GFR, Estimated: 51 mL/min — ABNORMAL LOW (ref >60)
Glucose, Bld: 180 mg/dL — ABNORMAL HIGH (ref 70–99)
Potassium: 3.8 mmol/L (ref 3.5–5.1)
Sodium: 139 mmol/L (ref 135–145)

## 2022-08-23 LAB — GLUCOSE, CAPILLARY
Glucose-Capillary: 188 mg/dL — ABNORMAL HIGH (ref 70–99)
Glucose-Capillary: 184 mg/dL — ABNORMAL HIGH (ref 70–99)
Glucose-Capillary: 223 mg/dL — ABNORMAL HIGH (ref 70–99)
Glucose-Capillary: 253 mg/dL — ABNORMAL HIGH (ref 70–99)

## 2022-08-23 MED ORDER — PREGABALIN 75 MG PO CAPS
150.0000 mg | ORAL_CAPSULE | Freq: Two times a day (BID) | ORAL | Status: DC
  Administered 2022-08-23 – 2022-08-28 (×11): 150 mg via ORAL
  Filled 2022-08-23 (×11): qty 2

## 2022-08-23 MED ORDER — LABETALOL HCL 5 MG/ML IV SOLN: 20.0000 mg | INTRAVENOUS | Status: DC | PRN

## 2022-08-23 NOTE — Progress Notes (Signed)
Physical Therapy Treatment Patient Details Name: Summer [name] MRN: [id number] DOB: Jan 22, 1951 Today's Date: 08/23/2022   History of Present Illness 71 y.o. female with medical history significant for Diabetes with polyneuropathy with a chronic left foot wound previously seen at the wound care clinic, OSA, CKD stage IIIb, depression, HTN, HFpEF, class III obesity, OSA and hypothyroidism,Who presents to the ED via EMS with frequent falls over the past 3 days, now with severe left knee pain.  L knee tap reveals gout.   Hospital course additionally significant for seizure activity with subsequent code blue (did not lose pulse) on 12/10 due to respiratory concerns (aborted due to DNR/DNI status per notes).    PT Comments    Pt vacillating on willingness to try working with PT, but ultimately did agree to try to get sitting and then to recliner (later to [employer] once feeling need to have another BM).  She continues to have a lot of hesitancy due to L knee pain but with gentle assist and cuing was able to get up to sitting EOB.  In sitting pt voiced need to have another BM (had one earlier today), donned post-op shoe and positioned BSC in preparation.  However she then c/o issues with nausea, when PT briefly left to get emesis bags pt has returned herself to bed and she reported that she was actively having BM in bed.  Nursing notified, further PT deferred to allow clean up.    Recommendations for follow up therapy are one component of a multi-disciplinary discharge planning process, led by the attending physician.  Recommendations may be updated based on patient status, additional functional criteria and insurance authorization.  Follow Up Recommendations  Skilled nursing-short term rehab (<3 hours/day) Can patient physically be transported by private vehicle: No   Assistance Recommended at Discharge Frequent or constant Supervision/Assistance  Patient can return home with the following Two people to help  with walking and/or transfers;Two people to help with bathing/dressing/bathroom;Assistance with cooking/housework;Help with stairs or ramp for entrance;Assist for transportation   Equipment Recommendations  None recommended by PT    Recommendations for Other Services       Precautions / Restrictions Precautions Precautions: Fall Restrictions LLE Weight Bearing: Weight bearing as tolerated Other Position/Activity Restrictions: L post op shoe     Mobility  Bed Mobility Overal bed mobility: Needs Assistance Bed Mobility: Rolling     Supine to sit: Min assist, Mod assist Sit to supine: Min assist, Mod assist   General bed mobility comments: Pt with 2 partial attempts to get to sitting with min assist that ultimately, were aborted as she c/o L knee or general pain.  Ultimately she did get to upright EOB, heavily reliant on UEs and needed a lot of reinforcement - in sitting she c/o nausea.  PT went to grab emesis bag and on return to room she was laying back down and reports she was actively having a BM. Notified nursing and further mobility deferred at this time.    Transfers                        Ambulation/Gait                   Stairs             Wheelchair Mobility    Modified Rankin (Stroke Patients Only)       Balance Overall balance assessment: Needs assistance Sitting-balance support: Bilateral upper extremity supported Sitting  balance-Leahy Scale: Fair Sitting balance - Comments: Pt was able to maintain sitting EOB w/o direct physical assist, but highly reliant on UE support and showing general discomfort.                                    Cognition Arousal/Alertness: Awake/alert Behavior During Therapy: Restless Overall Cognitive Status: No family/caregiver present to determine baseline cognitive functioning                                 General Comments: Continues to struggle to consistently follow  conversation        Exercises      General Comments General comments (skin integrity, edema, etc.): Pt c/o L knee pain with any movement, but with assist did tolerate getting to sitting EOB.  However between nausea and need to have a BM - she was limited with how much she actually was able to do/participate with      Pertinent Vitals/Pain Pain Assessment Pain Assessment: 0-10 Pain Score: 6  Pain Location: continues to have L knee pain with any movement    Home Living                          Prior Function            PT Goals (current goals can now be found in the care plan section) Acute Rehab PT Goals PT Goal Formulation: With patient Time For Goal Achievement: 09/05/22 Potential to Achieve Goals: Fair Progress towards PT goals: Progressing toward goals    Frequency    Min 2X/week      PT Plan Current plan remains appropriate    Co-evaluation              AM-PAC PT "6 Clicks" Mobility   Outcome Measure  Help needed turning from your back to your side while in a flat bed without using bedrails?: A Lot Help needed moving from lying on your back to sitting on the side of a flat bed without using bedrails?: A Lot Help needed moving to and from a bed to a chair (including a wheelchair)?: Total Help needed standing up from a chair using your arms (e.g., wheelchair or bedside chair)?: Total Help needed to walk in hospital room?: Total Help needed climbing 3-5 steps with a railing? : Total 6 Click Score: 8    End of Session   Activity Tolerance: Patient limited by pain;Other (comment) (nausea, BM) Patient left: in bed;with call bell/phone within reach;with bed alarm set Nurse Communication: Mobility status (need for clean up) PT Visit Diagnosis: Muscle weakness (generalized) (M62.81);Difficulty in walking, not elsewhere classified (R26.2);Pain Pain - Right/Left: Left Pain - part of body: Knee     Time: 1040-1053 PT Time Calculation (min)  (ACUTE ONLY): 13 min  Charges:  $Therapeutic Activity: 8-22 mins                     Kreg Shropshire, DPT 08/23/2022, 1:21 PM

## 2022-08-23 NOTE — Progress Notes (Signed)
ID Patient states she is doing well Headache is resolved Pain left knee is much better She did not want to participate with PT this morning she says.    O/e awake and alert and oriented to place , person, and knows why she is the hospital and is comfortable- has no headache Patient Vitals for the past 24 hrs:  BP Temp Temp src Pulse Resp SpO2  08/23/22 1145 (!) 148/59 98.8 F (37.1 C) -- 71 18 99 %  08/23/22 0916 (!) 166/64 99 F (37.2 C) -- 72 20 100 %  08/23/22 0429 (!) 159/57 97.8 F (36.6 C) Oral 68 18 100 %  08/22/22 2351 (!) 158/81 98.6 F (37 C) Oral 80 20 100 %  08/22/22 2236 -- -- -- -- 18 --  08/22/22 1958 (!) 162/73 98.3 F (36.8 C) Oral 96 19 100 %  08/22/22 1745 (!) 166/64 -- -- 78 14 100 %  08/22/22 1614 (!) 170/62 97.8 F (36.6 C) Oral 72 13 100 %   No neck rigidity Upper lip- crust ( present since admission and it is a cold sore).  Much better. Chest b/l air entry Hs s1s2 Abd soft Left knee mobility improved. Left foot- lateral ulcer and great toe callus/ulcer CNS non focal  Labs    Latest Ref Rng & Units 08/23/2022    4:47 AM 08/22/2022    3:51 AM 08/19/2022    3:56 AM  CBC  WBC 4.0 - 10.5 K/uL 16.3  18.9  18.6   Hemoglobin 12.0 - 15.0 g/dL 10.0  8.9  9.6   Hematocrit 36.0 - 46.0 % 30.0  27.2  28.7   Platelets 150 - 400 K/uL 477  491  312        Latest Ref Rng & Units 08/23/2022    4:47 AM 08/22/2022    3:51 AM 08/21/2022    5:02 AM  CMP  Glucose 70 - 99 mg/dL 180  304  248   BUN 8 - 23 mg/dL '25  29  30   '$ Creatinine 0.44 - 1.00 mg/dL 1.14  1.24  1.17   Sodium 135 - 145 mmol/L 139  134  139   Potassium 3.5 - 5.1 mmol/L 3.8  3.0  4.0   Chloride 98 - 111 mmol/L 107  103  107   CO2 22 - 32 mmol/L '25  23  22   '$ Calcium 8.9 - 10.3 mg/dL 8.9  8.7  8.7     Micro 11/30 BC Strep pneumo 12/1 BC- NG Left foot wound- Ng so far  Impression/recommendation   ?71 yr female presenting with multiple falls And Left knee pain for the past 3  daysl  Acute onset of headache and seizure and was encephalopathic over the weekend.- recovered  This is very Likely due to PRES as seen on MRI Doubt this is herpes encephalitis  Strep pneumo meninigits is also less likely but as LP could not be done will complete 7 days of meningitis dose of ceftriaxone.  Strep pneumo bacteremia Source currently not clear-  left foot wound is MSSA TEE negative for  endocarditis After 7 days of meningitic dose will raise use it to once a day dosing continue for a total of 4 weeks as we are treating it like septic arthritis until 09/06/22 Left knee acute gouty arthritis with possible septic arthritis.    diabetes mellitus with neuropathy, charcot foot and wound left foot X 2 The left lateral foot wound appears to be infected, where  as the great toie wound looks chronic, but culture has been negative   Rhabdomyolysis- due to fall- resolved   AKI   Charcot foot- H/o left Ankle surgey- has hardware       DM  Discussed the management with patient and  care team

## 2022-08-23 NOTE — Progress Notes (Signed)
PROGRESS NOTE    Summer [name]  UGQ:[id number] DOB: Oct 29, 1950 DOA: 08/10/2022 PCP: Steele Sizer, MD   Assessment & Plan:   Principal Problem:   Bacteremia due to Streptococcus pneumoniae Active Problems:   Sepsis ([place])   Traumatic rhabdomyolysis ([place])   Urinary tract infection   Acute pain of left knee   Ulcer of left foot due to type 2 diabetes mellitus ([place])   Uncontrolled type 2 diabetes mellitus with hyperglycemia, with long-term current use of insulin ([name])   Acute renal failure superimposed on stage 3b chronic kidney disease ([place])   Obstructive sleep apnea   Adult hypothyroidism   Morbid obesity with BMI of 40.0-44.9, adult ([name])   (HFpEF) heart failure with preserved ejection fraction ([name])   Major depressive disorder, recurrent episode, moderate ([name])   Metabolic acidosis   Multiple falls   Gouty arthritis   Pyogenic arthritis of left knee joint ([name])   Diabetic ulcer of left midfoot associated with diabetes mellitus due to underlying condition, limited to breakdown of skin ([place])   Seizure ([place])   Posterior reversible encephalopathy syndrome  Assessment and Plan: Encephalopathy: etiology unclear, possible PRES vs meningitis. Rapid response 12/10 for new severe headache, AMS.  Had seizure as well. Unsuccessful LP attempt 12/10. Encephalopathy resolved.    Bacteremia: blood cxs are growing streptococcus pneumoniae. Continue on IV rocephin x 4 weeks as per ID. TEE neg.   Traumatic rhabdomyolysis: CK continues to trend down   Frequent falls: PT/OT recs SNF. Waiting on insurance auth    Sepsis: secondary to bacteremia. Met criteria w/  fever, tachycardia, leukocytosis and AKI.   Continue on IV Rocephin. Repeat blood cxs NGTD. Sepsis resolved  Hypokalemia: WNL today   Hypomagnesemia: WNL    Acute pain of left knee: secondary to gout flare. S/p left knee aspiration that was consistent w/ gout. Continue on allopurinol. S/p po steroids & steroid injection on  12/8.  Ulcer of left foot: secondary to DM2. Secondary to hallux malleus contracture leading to excessive stress to the distal tip of the hallux and therefore ulceration as per podiatry. Weightbearing as tolerated with surgical shoe.  No surgery plans per podiatry. Continue on IV abxs. Outpatient podiatry follow-up with Dr. Amalia Hailey in 1 week post discharge.    DM2: poorly controlled. Continue on glargine, SSI w/ accuchecks   AKI on CKDIIIb: likely secondary to sepsis & possible rhabdomyolysis. Cr is labile    Major depressive disorder: moderate. Continue on sertraline, trazodone. Holding bupropion   Chronic diastolic CHF: appears euvolemic. Continue on metoprolol. Holding lasix    Morbid obesity: with BMI of 40.0-44.9. Complicates overall care & prognosis   Hypothyroidism: continue on levothyroxine   OSA: CPAP qhs    Mucosal bleeding: etiology unclear. Continue w/ supportive care   HTN: continue on amlodipine, metoprolol   Thrombocytosis: etiology unclear. Will continue to monitor   ACD: likely secondary to CKD. No need for a transfusion currently     DVT prophylaxis: SCDs Code Status: DNR Family Communication:  Disposition Plan: waiting on insurance auth   Level of care: Telemetry Medical  Status is: Inpatient Remains inpatient appropriate because: waiting on insurance auth    Consultants:  ID Neuro   Procedures:   Antimicrobials: rocephin    Subjective: Pt c/o malaise   Objective: Vitals:   08/22/22 1958 08/22/22 2236 08/22/22 2351 08/23/22 0429  BP: (!) 162/73  (!) 158/81 (!) 159/57  Pulse: 96  80 68  Resp: _0 Temp:  98.3 F (36.8 C)  98.6 F (37 C) 97.8 F (36.6 C)  TempSrc: Oral  Oral Oral  SpO2: 100%  100% 100%  Weight:      Height:        Intake/Output Summary (Last 24 hours) at 08/23/2022 0821 Last data filed at 08/23/2022 0646 Gross per 24 hour  Intake 312 ml  Output 1250 ml  Net -938 ml   Filed Weights   08/10/22 1801   Weight: 109.3 kg    Examination:  General exam: Appears calm and comfortable  Respiratory system: Clear to auscultation. Respiratory effort normal. Cardiovascular system: S1 & S2+. No rubs, gallops or clicks.  Gastrointestinal system: Abdomen is nondistended, soft and nontender.  Normal bowel sounds heard. Central nervous system: Alert and oriented. Moves all extremities  Psychiatry: Judgement and insight appear normal. Flat mood and affect .     Data Reviewed: I have personally reviewed following labs and imaging studies  CBC: Recent Labs  Lab 08/17/22 0530 08/19/22 0356 08/22/22 0351 08/23/22 0447  WBC 16.0* 18.6* 18.9* 16.3*  NEUTROABS  --  13.4*  --   --   HGB 10.6* 9.6* 8.9* 10.0*  HCT 32.4* 28.7* 27.2* 30.0*  MCV 84.4 84.2 84.0 84.0  PLT 273 312 491* 811*   Basic Metabolic Panel: Recent Labs  Lab 08/17/22 0530 08/19/22 0356 08/20/22 0558 08/21/22 0502 08/22/22 0351 08/23/22 0447  NA 139 136 136 139 134* 139  K 3.4* 3.5 3.3* 4.0 3.0* 3.8  CL 107 105 106 107 103 107  CO2 22 21* _0 GLUCOSE 182* 225* 222* 248* 304* 180*  BUN 41* 30* 31* 30* 29* 25*  CREATININE 1.69* 1.41* 1.30* 1.17* 1.24* 1.14*  CALCIUM 8.7* 8.7* 8.7* 8.7* 8.7* 8.9  MG 1.7  --   --   --  1.7  --    GFR: Estimated Creatinine Clearance: 51.2 mL/min (A) (by C-G formula based on SCr of 1.14 mg/dL (H)). Liver Function Tests: No results for input(s): "AST", "ALT", "ALKPHOS", "BILITOT", "PROT", "ALBUMIN" in the last 168 hours. No results for input(s): "LIPASE", "AMYLASE" in the last 168 hours. No results for input(s): "AMMONIA" in the last 168 hours. Coagulation Profile: No results for input(s): "INR", "PROTIME" in the last 168 hours. Cardiac Enzymes: No results for input(s): "CKTOTAL", "CKMB", "CKMBINDEX", "TROPONINI" in the last 168 hours. BNP (last 3 results) No results for input(s): "PROBNP" in the last 8760 hours. HbA1C: No results for input(s): "HGBA1C" in the last 72  hours. CBG: Recent Labs  Lab 08/21/22 2142 08/22/22 0826 08/22/22 1152 08/22/22 1616 08/22/22 2014  GLUCAP 234* 319* 295* 192* 202*   Lipid Profile: No results for input(s): "CHOL", "HDL", "LDLCALC", "TRIG", "CHOLHDL", "LDLDIRECT" in the last 72 hours. Thyroid Function Tests: No results for input(s): "TSH", "T4TOTAL", "FREET4", "T3FREE", "THYROIDAB" in the last 72 hours. Anemia Panel: No results for input(s): "VITAMINB12", "FOLATE", "FERRITIN", "TIBC", "IRON", "RETICCTPCT" in the last 72 hours. Sepsis Labs: No results for input(s): "PROCALCITON", "LATICACIDVEN" in the last 168 hours.  Recent Results (from the past 240 hour(s))  Culture, blood (Routine X 2) w Reflex to ID Panel     Status: None   Collection Time: 08/13/22  2:05 PM   Specimen: BLOOD  Result Value Ref Range Status   Specimen Description BLOOD LEFT ANTECUBITAL  Final   Special Requests   Final    BOTTLES DRAWN AEROBIC AND ANAEROBIC Blood Culture adequate volume   Culture   Final  NO GROWTH 5 DAYS Performed at [facility], [address] [place], [place] [address]    Report Status 08/18/2022 FINAL  Final  Culture, blood (Routine X 2) w Reflex to ID Panel     Status: None   Collection Time: 08/13/22  2:05 PM   Specimen: BLOOD  Result Value Ref Range Status   Specimen Description BLOOD BLOOD LEFT HAND  Final   Special Requests   Final    BOTTLES DRAWN AEROBIC AND ANAEROBIC Blood Culture adequate volume   Culture   Final    NO GROWTH 5 DAYS Performed at [facility], [address] [place], [place] [address]    Report Status 08/18/2022 FINAL  Final  Aerobic Culture w Gram Stain (superficial specimen)     Status: None   Collection Time: 08/14/22 11:00 PM   Specimen: Foot; Wound  Result Value Ref Range Status   Specimen Description   Final    FOOT Performed at [facility], [address] [place], [place] [address]    Special Requests   Final    NONE Performed at  [facility], [address] [place], [place] [address]    Gram Stain   Final    NO WBC SEEN NO ORGANISMS SEEN Performed at [facility], [place] [address] [place], [place] [address]    Culture RARE STAPHYLOCOCCUS AUREUS  Final   Report Status 08/18/2022 FINAL  Final   Organism ID, Bacteria STAPHYLOCOCCUS AUREUS  Final      Susceptibility   Staphylococcus aureus - MIC*    CIPROFLOXACIN <=0.5 SENSITIVE Sensitive     ERYTHROMYCIN 4 INTERMEDIATE Intermediate     GENTAMICIN <=0.5 SENSITIVE Sensitive     OXACILLIN <=0.25 SENSITIVE Sensitive     TETRACYCLINE >=16 RESISTANT Resistant     VANCOMYCIN <=0.5 SENSITIVE Sensitive     TRIMETH/SULFA <=10 SENSITIVE Sensitive     CLINDAMYCIN <=0.25 SENSITIVE Sensitive     RIFAMPIN <=0.5 SENSITIVE Sensitive     Inducible Clindamycin NEGATIVE Sensitive     * RARE STAPHYLOCOCCUS AUREUS         Radiology Studies: DG FL GUIDED LUMBAR PUNCTURE  Result Date: 08/22/2022 CLINICAL DATA:  Provided history: Acute encephalopathy. EXAM: DIAGNOSTIC LUMBAR PUNCTURE UNDER FLUOROSCOPIC GUIDANCE COMPARISON:  Radiographs of the lumbar spine from the lumbar puncture attempted yesterday 08/21/2022. Lumbar spine MRI 11/09/2019. FLUOROSCOPY: Fluoroscopy time: 1 minute, 18 seconds (55.50 mGy). PROCEDURE: Due to the patient's altered mental status, Rushie Nyhan, NP obtained informed consent from the patient's POA (Darrel Lambe) prior to the procedure. This process included a discussion of procedural risks. The patient was position prone on the fluoroscopy table. An appropriate skin entry site was determined under fluoroscopy and marked. The operator donned sterile gloves and a mask. A time-out was performed. The skin entry site was prepped with Betadine and draped in the usual sterile fashion. 1% lidocaine was used for local anesthesia. Under fluoroscopic guidance, a 20 gauge 3.5 inch spinal needle was advanced into the thecal sac at the L4-L5 level.  There was transient return of CSF into the needle hub. However, no CSF could be collected despite tilting the table, and despite needle repositioning. Subsequently, lumbar puncture was attempted at the L5-S1 level. However, the patient became agitated during this lumbar puncture attempt. At this point, it was felt unsafe to continue and the procedure was terminated. The procedure was performed by Rushie Nyhan, NP, and was supervised by Dr. Kellie Simmering. IMPRESSION: Fluoroscopically-guided lumbar  puncture at the L4-L5 level, as described. Although there was transient CSF return into the needle hub, no CSF could be collected. In reviewing the patient's prior lumbar spine MRI of 11/09/2019, this poor CSF return is likely related to prominent epidural lipomatosis resulting in diffuse narrowing of the lumbar thecal sac. Fluoroscopically-guided lumbar puncture was also attempted at the L5-S1 level. However, the patient became agitated during this lumbar puncture attempt. At this point, it was felt unsafe to continue and the procedure was terminated. Electronically Signed   By: Kellie Simmering D.O.   On: 08/22/2022 14:25   DG FL GUIDED LUMBAR PUNCTURE  Result Date: 08/21/2022 PROCEDURE: This procedure was unable to be performed due to patient condition. Initial scouting images of lumbar spine were performed, but Reatha Armour, PA-C reports the patient was unable to hold still for procedure, and it was aborted. Case was discussed with the ordering team on the floor. Electronically Signed   By: Yvonne Kendall M.D.   On: 08/21/2022 17:49   EEG adult  Result Date: 08/21/2022 Derek Jack, MD     08/21/2022  3:39 PM Routine EEG Report JEWEL MCAFEE is a 71 y.o. female with a history of seizure who is undergoing an EEG to evaluate for seizures. Report: This EEG was acquired with electrodes placed according to the International 10-20 electrode system (including Fp1, Fp2, F3, F4, C3, C4, P3, P4, O1, O2, T3, T4, T5,  T6, A1, A2, Fz, Cz, Pz). The following electrodes were missing or displaced: none. The occipital dominant rhythm was 6 Hz with intermittent diffuse slowing and continuous right focal slowing. This activity is reactive to stimulation. Drowsiness was manifested by background fragmentation; deeper stages of sleep were identified by K complexes and sleep spindles. There were no interictal epileptiform discharges. There were no electrographic seizures identified. Photic stimulation and hyperventilation were not performed. Impression and clinical correlation: This EEG was obtained while awake and asleep and is abnormal due to mild-to-moderate diffuse slowing indicative of global cerebral dysfunction as well as focal slowing over the right hemisphere indicating focal cerebral dysfunction in that region. Epileptiform abnormalities were not seen during this recording. Su Monks, MD [facility] [phone] If 7pm- 7am, please page neurology on call as listed in [place].        Scheduled Meds:  (feeding supplement) PROSource Plus  30 mL Oral TID BM   allopurinol  50 mg Oral Daily   amLODipine  10 mg Oral Daily   vitamin C  500 mg Oral BID   bupivacaine(PF)  10 mL Intra-articular Once   ezetimibe  10 mg Oral Daily   hydrochlorothiazide  25 mg Oral Daily   icosapent Ethyl  2 g Oral BID   insulin aspart  0-20 Units Subcutaneous TID WC   insulin aspart  0-5 Units Subcutaneous QHS   insulin aspart  3 Units Subcutaneous TID WC   insulin glargine-yfgn  25 Units Subcutaneous QHS   levothyroxine  25 mcg Oral Once per day on Mon Tue Wed Thu Fri Sat   And   levothyroxine  50 mcg Oral Once per day on Sun   metoprolol tartrate  50 mg Oral BID   modafinil  100 mg Oral Daily   multivitamin with minerals  1 tablet Oral Daily   pantoprazole  40 mg Oral Daily   sertraline  150 mg Oral Daily   triamcinolone acetonide  80 mg Intra-articular Once   zinc sulfate  220 mg Oral Daily   Continuous  Infusions:  cefTRIAXone (ROCEPHIN)  IV 2 g (08/23/22 0412)     LOS: 13 days    Time spent: 25 mins     Wyvonnia Dusky, MD [facility] Pager [phone]  If 7PM-7AM, please contact night-coverage [email or web address] 08/23/2022, 8:21 AM

## 2022-08-23 NOTE — Telephone Encounter (Signed)
Copied from [place] [id number]. Topic: General - Other >> Aug 23, 2022 11:36 AM Chapman Fitch wrote: Reason for CRM: pt called to let Dr. Ancil Boozer know she is in the hospital at [facility] room 115 and has been there since Friday / please advise

## 2022-08-23 NOTE — Progress Notes (Signed)
Neurology Progress Note  Patient ID / pertinent hospital course: Summer [name] is a 70 y.o. female with medical history significant for Diabetes with polyneuropathy with a chronic left foot wound previously seen at the wound care clinic (MSSA on culture here), OSA, CKD stage IIIb, depression, HTN, HFpEF, class III obesity, OSA and hypothyroidism, presented to the ED on 08/10/2022 via EMS with frequent falls over the 3 day prior to admission, found to have a L knee effusion and fever with CK of 6435, started on broad spectrum Abx. 12/1 knee aspirate c/w gout for which steroids were started 12/3 ID consulted for strep pneumo bacteremia (unclear source)  12/4 allopurinol started for gout 12/5 steroids stopped 12/6 TEE negative for endocarditis 12/10 Neurology consulted for severe HA, failed bedside LP, broadened to meningitic coverage w/ ceftriaxone, decadron, acyclovir; MRI w/ c/f PRES, Wellbutrin stopped, keppra 3000 mg IV loaded 12/11 Failed IR LP due to agitation, EEG w/ intermittent diffuse slowing and focal right slowing, Keppra 500 mg BID considered but not ordered, mental status rapidly improving in the afternoon per ID team 12/12 Failed second IR LP attempt, scant CSF flash in needle, felt to be secondary to diffuse epidural lipomatosis narrowing her lumbar thecal sac severely; acyclovir stopped in discussion with ID   Subjective: -Feeling nauseated -Thirsty but does not find food at bedside appetizing (breakfast) -Reports   Exam: Current vital signs: BP (!) 159/57 (BP Location: Right Arm)   Pulse 68   Temp 97.8 F (36.6 C) (Oral)   Resp 18   Ht '5\' 2"'$  (1.575 m)   Wt 109.3 kg   SpO2 100%   BMI 44.08 kg/m  Vital signs in last 24 hours: Temp:  [97.8 F (36.6 C)-98.6 F (37 C)] 97.8 F (36.6 C) (12/13 0429) Pulse Rate:  [68-96] 68 (12/13 0429) Resp:  [13-20] 18 (12/13 0429) BP: (157-171)/(57-81) 159/57 (12/13 0429) SpO2:  [98 %-100 %] 100 % (12/13 0429)   Gen: In bed,  comfortable  Resp: non-labored breathing, no grossly audible wheezing Head: Scabbed over ulcers on the left upper lip Cardiac: Perfusing extremities well  Abd: soft, nt Musculoskeletal: Healing scabs on bilateral knees, no significant erythema or large effusion or calor left versus right  Neuro: MS: Oriented to [facility], year, month, but states its Thursday, knows some but not all details of recent events CN: EOMI, PERRL, tongue midline with notable bruising of right tongue, face symmetric, sensation in the face symmetric, 5/5 shoulder shrug bilaterally Motor: pain limited diffusely but especially right deltoid and left hip, poor effort throughout but able to participate at least 4/5 throughout  Sensory: Length dependent sensory loss   Pertinent Labs:  Basic Metabolic Panel: Recent Labs  Lab 08/17/22 0530 08/19/22 0356 08/20/22 0558 08/21/22 0502 08/22/22 0351 08/23/22 0447  NA 139 136 136 139 134* 139  K 3.4* 3.5 3.3* 4.0 3.0* 3.8  CL 107 105 106 107 103 107  CO2 22 21* '23 22 23 25  '$ GLUCOSE 182* 225* 222* 248* 304* 180*  BUN 41* 30* 31* 30* 29* 25*  CREATININE 1.69* 1.41* 1.30* 1.17* 1.24* 1.14*  CALCIUM 8.7* 8.7* 8.7* 8.7* 8.7* 8.9  MG 1.7  --   --   --  1.7  --     Estimated Creatinine Clearance: 51.2 mL/min (A) (by C-G formula based on SCr of 1.14 mg/dL (H)).   CBC: Recent Labs  Lab 08/17/22 0530 08/19/22 0356 08/22/22 0351 08/23/22 0447  WBC 16.0* 18.6* 18.9* 16.3*  NEUTROABS  --  13.4*  --   --   HGB 10.6* 9.6* 8.9* 10.0*  HCT 32.4* 28.7* 27.2* 30.0*  MCV 84.4 84.2 84.0 84.0  PLT 273 312 491* 477*     Coagulation Studies: No results for input(s): "LABPROT", "INR" in the last 72 hours.    Impression: Exam improving even with holding acyclovir. Etiology of seizure would be provoked in the setting of PRES, which also explains focality. I would like to restart home pregabalin for multiple effects (pain control, anxiety/mood, sleep aid at night and seizure  prophylaxis), but at half home dose due to her renal function  Etiology of press secondary to uncontrolled hypertension versus potentially serotonin syndrome, in this case patient was exposed to sertraline (currently taking) as well as welbutrin (stopped) and trazadone as needed (100 mg on 12/09 and 12/11 nightly).  All of these are listed are home medications with trazodone increased from 50 to 100 mg nightly in Oct 2022, sertraline started in 2020 after MRI negative tPA treated left sided weakness / nausea / dysarthria iso BP 174/73,   Recommendations: -Continue to hold Wellbutrin -Okay to continue trazodone and sertraline at this time -Will continue to hold Keppra at this time, but resume home pregabalin 150 mg BID,  -Strict blood pressure control systolic blood pressure less than 160 -Neurology will continue to follow  Lesleigh Noe MD-PhD [facility] [phone]   Greater than 35 minutes were spent in care of this patient today, the majority at bedside

## 2022-08-24 ENCOUNTER — Telehealth: Payer: Self-pay | Admitting: Podiatry

## 2022-08-24 DIAGNOSIS — L97529 Non-pressure chronic ulcer of other part of left foot with unspecified severity: Secondary | ICD-10-CM | POA: Diagnosis not present

## 2022-08-24 DIAGNOSIS — N179 Acute kidney failure, unspecified: Secondary | ICD-10-CM | POA: Diagnosis not present

## 2022-08-24 DIAGNOSIS — R7881 Bacteremia: Secondary | ICD-10-CM | POA: Diagnosis not present

## 2022-08-24 DIAGNOSIS — B953 Streptococcus pneumoniae as the cause of diseases classified elsewhere: Secondary | ICD-10-CM | POA: Diagnosis not present

## 2022-08-24 LAB — BASIC METABOLIC PANEL
Anion gap: 5 (ref 5–15)
BUN: 29 mg/dL — ABNORMAL HIGH (ref 8–23)
CO2: 25 mmol/L (ref 22–32)
Calcium: 8.7 mg/dL — ABNORMAL LOW (ref 8.9–10.3)
Chloride: 108 mmol/L (ref 98–111)
Creatinine, Ser: 1.21 mg/dL — ABNORMAL HIGH (ref 0.44–1.00)
GFR, Estimated: 47 mL/min — ABNORMAL LOW (ref >60)
Glucose, Bld: 232 mg/dL — ABNORMAL HIGH (ref 70–99)
Potassium: 4.1 mmol/L (ref 3.5–5.1)
Sodium: 138 mmol/L (ref 135–145)

## 2022-08-24 LAB — CBC
HCT: 27.5 % — ABNORMAL LOW (ref 36.0–46.0)
Hemoglobin: 9.1 g/dL — ABNORMAL LOW (ref 12.0–15.0)
MCH: 28.2 pg (ref 26.0–34.0)
MCHC: 33.1 g/dL (ref 30.0–36.0)
MCV: 85.1 fL (ref 80.0–100.0)
Platelets: 395 10*3/uL (ref 150–400)
RBC: 3.23 MIL/uL — ABNORMAL LOW (ref 3.87–5.11)
RDW: 16.5 % — ABNORMAL HIGH (ref 11.5–15.5)
WBC: 16.2 10*3/uL — ABNORMAL HIGH (ref 4.0–10.5)
nRBC: 0 % (ref 0.0–0.2)

## 2022-08-24 LAB — GLUCOSE, CAPILLARY
Glucose-Capillary: 227 mg/dL — ABNORMAL HIGH (ref 70–99)
Glucose-Capillary: 335 mg/dL — ABNORMAL HIGH (ref 70–99)
Glucose-Capillary: 335 mg/dL — ABNORMAL HIGH (ref 70–99)
Glucose-Capillary: 190 mg/dL — ABNORMAL HIGH (ref 70–99)
Glucose-Capillary: 319 mg/dL — ABNORMAL HIGH (ref 70–99)

## 2022-08-24 LAB — MAGNESIUM: Magnesium: 1.9 mg/dL (ref 1.7–2.4)

## 2022-08-24 MED ORDER — ENOXAPARIN SODIUM 60 MG/0.6ML IJ SOSY
0.5000 mg/kg | PREFILLED_SYRINGE | INTRAMUSCULAR | Status: DC
  Administered 2022-08-24 – 2022-08-27 (×4): 55 mg via SUBCUTANEOUS
  Filled 2022-08-24 (×4): qty 0.6

## 2022-08-24 MED ORDER — ENSURE ENLIVE PO LIQD
237.0000 mL | Freq: Three times a day (TID) | ORAL | Status: DC
  Administered 2022-08-24 – 2022-08-28 (×6): 237 mL via ORAL

## 2022-08-24 NOTE — Inpatient Diabetes Management (Signed)
Inpatient Diabetes Program Recommendations  AACE/ADA: New Consensus Statement on Inpatient Glycemic Control (2015)  Target Ranges:  Prepandial:   less than 140 mg/dL      Peak postprandial:   less than 180 mg/dL (1-2 hours)      Critically ill patients:  140 - 180 mg/dL   Lab Results  Component Value Date   GLUCAP 227 (H) 08/24/2022   HGBA1C 7.5 06/06/2022    Review of Glycemic Control  Latest Reference Range & Units 08/23/22 09:15 08/23/22 11:42 08/23/22 16:01 08/23/22 20:10 08/24/22 08:51  Glucose-Capillary 70 - 99 mg/dL 188 (H)  Novolog 7 units 184 (H)  Novolog 7 units 223 (H)  Novolog 10 units 253 (H)  Novolog 3 units 227 (H)  Novolog 10 units   Diabetes history: DM 2 Outpatient Diabetes medications: 70/30 100 units qevening, metformin 1000 mg Daily Current orders for Inpatient glycemic control:  Semglee 25 units qhs Novolog 3 units tid meal coverage Novolog 0-20 units tid + hs  Inpatient Diabetes Program Recommendations:    -  Increase Semglee to 30 units -  Increase Novolog meal coverage to 6 units tid if eating >50% of meals  Thanks,  Tama Headings RN, MSN, BC-ADM Inpatient Diabetes Coordinator Team Pager [phone] (8a-5p)

## 2022-08-24 NOTE — Progress Notes (Signed)
PHARMACIST - PHYSICIAN COMMUNICATION  CONCERNING:  Enoxaparin (Lovenox) for DVT Prophylaxis   DESCRIPTION: Patient was prescribed enoxaprin '40mg'$  q24 hours for VTE prophylaxis.   Filed Weights   08/10/22 1801  Weight: 109.3 kg (241 lb)    Body mass index is 44.08 kg/m.  Estimated Creatinine Clearance: 48.2 mL/min (A) (by C-G formula based on SCr of 1.21 mg/dL (H)).   Based on [place] patient is candidate for enoxaparin 0.'5mg'$ /kg TBW SQ every 24 hours based on BMI being >30.  RECOMMENDATION: Pharmacy has adjusted enoxaparin dose per [facility] policy.  Patient is now receiving enoxaparin 55 mg every 24 hours    Vick Frees, PharmD Clinical Pharmacist  08/24/2022 10:41 AM

## 2022-08-24 NOTE — Progress Notes (Signed)
Patient was c/o left chest pain (under her left breast) Gave PRN pain medications. Made MD aware of same.

## 2022-08-24 NOTE — Progress Notes (Signed)
Occupational Therapy Treatment Patient Details Name: Summer [name] MRN: [id number] DOB: Apr 16, 1951 Today's Date: 08/24/2022   History of present illness 71 y.o. female with medical history significant for Diabetes with polyneuropathy with a chronic left foot wound previously seen at the wound care clinic, OSA, CKD stage IIIb, depression, HTN, HFpEF, class III obesity, OSA and hypothyroidism,Who presents to the ED via EMS with frequent falls over the past 3 days, now with severe left knee pain.  L knee tap reveals gout.   Hospital course additionally significant for seizure activity with subsequent code blue (did not lose pulse) on 12/10 due to respiratory concerns (aborted due to DNR/DNI status per notes).   OT comments  Ms. Preslar shows considerable more alertness, better orientation, reduced pain, and improved mobility compared to rehab sessions earlier this week and in the previous week. She is able to roll in bed bilaterally with CGA, demonstrates greater ROM in L knee. Following much encouragement and cueing, pt is able to come to EOB sitting with Mod A, strong L lateral lean but able to remain in sitting for 10+ minutes. Performs UB bathing and clothing change in sitting with Min A. After returning to supine, pt able to reach overhead for headboard and pull herself towards HOB. Pt is able to engage in appropriate conversation, oriented to place and situation. Goals of care re-evaluated and updated. Pt is making slow but steady progress towards achieving goals. Continue to recommend DC to SNF to continue the rehab process.    Recommendations for follow up therapy are one component of a multi-disciplinary discharge planning process, led by the attending physician.  Recommendations may be updated based on patient status, additional functional criteria and insurance authorization.    Follow Up Recommendations  Skilled nursing-short term rehab (<3 hours/day)     Assistance Recommended at Discharge  Frequent or constant Supervision/Assistance  Patient can return home with the following  A lot of help with walking and/or transfers;A lot of help with bathing/dressing/bathroom;Assistance with cooking/housework;Help with stairs or ramp for entrance;Assist for transportation   Equipment Recommendations  None recommended by OT    Recommendations for Other Services      Precautions / Restrictions Precautions Precautions: Fall Restrictions Weight Bearing Restrictions: Yes LLE Weight Bearing: Weight bearing as tolerated Other Position/Activity Restrictions: L post op shoe       Mobility Bed Mobility Overal bed mobility: Needs Assistance Bed Mobility: Supine to Sit, Sit to Supine     Supine to sit: Mod assist Sit to supine: Min assist   General bed mobility comments: Required Mod A to move legs off bed for supine<sit. Min A for elevating LE back to bed level for sit<supine.    Transfers                   General transfer comment: not attempted     Balance Overall balance assessment: Needs assistance Sitting-balance support: Bilateral upper extremity supported Sitting balance-Leahy Scale: Fair Sitting balance - Comments: Able to maintain sitting balance EOB for >10 minutes, with significant L lateral lean and occasional posterior fall/lean Postural control: Left lateral lean, Posterior lean   Standing balance-Leahy Scale: Zero                             ADL either performed or assessed with clinical judgement   ADL Overall ADL's : Needs assistance/impaired Eating/Feeding: Modified independent Eating/Feeding Details (indicate cue type and reason): pt endorses poor  appetite, nausea     Upper Body Bathing: Minimal assistance Upper Body Bathing Details (indicate cue type and reason): cueing for task initiation and continuation     Upper Body Dressing : Minimal assistance;Sitting                          Extremity/Trunk Assessment Upper  Extremity Assessment Upper Extremity Assessment: Generalized weakness   Lower Extremity Assessment Lower Extremity Assessment: Generalized weakness        Vision       Perception     Praxis      Cognition Arousal/Alertness: Awake/alert Behavior During Therapy: WFL for tasks assessed/performed                           Following Commands: Follows one step commands consistently, Follows one step commands with increased time       General Comments: Much more oriented today than earlier this week or last week        Exercises Other Exercises Other Exercises: Rolling in supine to L and R with CGA, repositioning towards HOB in supine with Min A    Shoulder Instructions       General Comments Pt initially resistant to do much, but with some cuing she was willing to participate    Pertinent Vitals/ Pain       Pain Assessment Pain Score: 5  Breathing: occasional labored breathing, short period of hyperventilation Negative Vocalization: occasional moan/groan, low speech, negative/disapproving quality Facial Expression: sad, frightened, frown Body Language: tense, distressed pacing, fidgeting Consolability: no need to console PAINAD Score: 4 Pain Location: continues to have L knee pain with any movement, though much less sensitive with this today, still very sensitive with WBing attempt Pain Descriptors / Indicators: Guarding, Aching, Grimacing Pain Intervention(s): Repositioned, Limited activity within patient's tolerance, Relaxation  Home Living                                          Prior Functioning/Environment              Frequency  Min 2X/week        Progress Toward Goals  OT Goals(current goals can now be found in the care plan section)  Progress towards OT goals: Progressing toward goals  Acute Rehab OT Goals OT Goal Formulation: With patient Time For Goal Achievement: 09/09/22 Potential to Achieve Goals: Good   Plan Discharge plan remains appropriate;Frequency remains appropriate    Co-evaluation                 AM-PAC OT "6 Clicks" Daily Activity     Outcome Measure   Help from another person eating meals?: A Little Help from another person taking care of personal grooming?: A Little Help from another person toileting, which includes using toliet, bedpan, or urinal?: A Lot Help from another person bathing (including washing, rinsing, drying)?: A Lot Help from another person to put on and taking off regular upper body clothing?: A Little Help from another person to put on and taking off regular lower body clothing?: A Lot 6 Click Score: 15    End of Session Equipment Utilized During Treatment: Oxygen  OT Visit Diagnosis: Unsteadiness on feet (R26.81);History of falling (Z91.81);Muscle weakness (generalized) (M62.81);Pain   Activity Tolerance Patient limited by lethargy;Patient limited by pain;Patient tolerated  treatment well   Patient Left in bed;with call bell/phone within reach;with bed alarm set   Nurse Communication Mobility status;Other (comment) (IV dislodged)        Time: 5038-8828 OT Time Calculation (min): 22 min  Charges: OT General Charges $OT Visit: 1 Visit OT Evaluation $OT Re-eval: 1 Re-eval OT Treatments $Self Care/Home Management : 8-22 mins Josiah Lobo, PhD, MS, OTR/L 08/24/22, 3:19 PM

## 2022-08-24 NOTE — Progress Notes (Signed)
Physical Therapy Treatment Patient Details Name: Summer [name] MRN: [id number] DOB: 1950/10/20 Today's Date: 08/24/2022   History of Present Illness 71 y.o. female with medical history significant for Diabetes with polyneuropathy with a chronic left foot wound previously seen at the wound care clinic, OSA, CKD stage IIIb, depression, HTN, HFpEF, class III obesity, OSA and hypothyroidism,Who presents to the ED via EMS with frequent falls over the past 3 days, now with severe left knee pain.  L knee tap reveals gout.   Hospital course additionally significant for seizure activity with subsequent code blue (did not lose pulse) on 12/10 due to respiratory concerns (aborted due to DNR/DNI status per notes).    PT Comments    Pt more clear of mind and interactive today, did show good effort though still needing a lot of cuing, encouragement and reinforcement to fully participate.  Continued L knee pain with most activity, but much more tolerant with mobility tasks, struggled with WBing attempt.  Pt c/o some nausea with sitting, asked for emesis bag but did not need.  Overall still very functionally limited but showing increased effort, participation and engagement.     Recommendations for follow up therapy are one component of a multi-disciplinary discharge planning process, led by the attending physician.  Recommendations may be updated based on patient status, additional functional criteria and insurance authorization.  Follow Up Recommendations  Skilled nursing-short term rehab (<3 hours/day) Can patient physically be transported by private vehicle: No   Assistance Recommended at Discharge Frequent or constant Supervision/Assistance  Patient can return home with the following Two people to help with walking and/or transfers;Two people to help with bathing/dressing/bathroom;Assistance with cooking/housework;Help with stairs or ramp for entrance;Assist for transportation   Equipment Recommendations   None recommended by PT    Recommendations for Other Services       Precautions / Restrictions Precautions Precautions: Fall Restrictions LLE Weight Bearing: Weight bearing as tolerated (with post-op shoe)     Mobility  Bed Mobility Overal bed mobility: Needs Assistance Bed Mobility: Supine to Sit, Sit to Supine     Supine to sit: Min assist, Mod assist Sit to supine: Min guard   General bed mobility comments: Pt showed good effort with getting LEs back up into bed, guarding only during transition but needed assist to scoot up and over in bed.    Transfers Overall transfer level: Needs assistance Equipment used: Rolling walker (2 wheels) Transfers: Sit to/from Stand Sit to Stand: Mod assist, Max assist           General transfer comment: Pt took a lot of convincing to try, but ultimately did make a good effort to get to standing with plenty of cuing and direct assist.  She did not maintain standing well or long as she c/o L knee pain that was severe, post-op shoes donned, unable to attain TKE on L    Ambulation/Gait               General Gait Details: unsafe/unable   Stairs             Wheelchair Mobility    Modified Rankin (Stroke Patients Only)       Balance Overall balance assessment: Modified Independent Sitting-balance support: Bilateral upper extremity supported Sitting balance-Leahy Scale: Fair Sitting balance - Comments: Pt was able to maintain sitting EOB w/o direct physical assist, does report some nausea, but much less UE reliant and able to maintain sitting >10 minutes today w/o any direct assist  Cognition Arousal/Alertness: Awake/alert Behavior During Therapy: WFL for tasks assessed/performed Overall Cognitive Status:  (pt much more alert, interactive and showing general awareness today than prior sessions)                                          Exercises       General Comments General comments (skin integrity, edema, etc.): Pt initially resistant to do much, but with some cuing she was willing to participate      Pertinent Vitals/Pain Pain Assessment Pain Assessment: 0-10 Pain Score: 8  Pain Location: continues to have L knee pain with any movement, though much less sensitive with this today, still very sensitive with WBing attempt    Home Living                          Prior Function            PT Goals (current goals can now be found in the care plan section) Progress towards PT goals: Progressing toward goals    Frequency    Min 2X/week      PT Plan Current plan remains appropriate    Co-evaluation              AM-PAC PT "6 Clicks" Mobility   Outcome Measure  Help needed turning from your back to your side while in a flat bed without using bedrails?: A Little Help needed moving from lying on your back to sitting on the side of a flat bed without using bedrails?: A Lot Help needed moving to and from a bed to a chair (including a wheelchair)?: Total Help needed standing up from a chair using your arms (e.g., wheelchair or bedside chair)?: A Lot Help needed to walk in hospital room?: Total Help needed climbing 3-5 steps with a railing? : Total 6 Click Score: 10    End of Session Equipment Utilized During Treatment: Gait belt;Oxygen Activity Tolerance: Patient tolerated treatment well;Patient limited by pain Patient left: in bed;with call bell/phone within reach;with bed alarm set   PT Visit Diagnosis: Muscle weakness (generalized) (M62.81);Difficulty in walking, not elsewhere classified (R26.2);Pain Pain - Right/Left: Left Pain - part of body: Knee     Time: 1030-1314 PT Time Calculation (min) (ACUTE ONLY): 33 min  Charges:  $Therapeutic Exercise: 8-22 mins $Therapeutic Activity: 8-22 mins                     Kreg Shropshire, DPT 08/24/2022, 1:37 PM

## 2022-08-24 NOTE — Telephone Encounter (Signed)
Returned call to patient , no voicemail , patient shoes are still in processing .  We will call her as soon as they arrive in office.

## 2022-08-24 NOTE — Progress Notes (Signed)
PROGRESS NOTE    Summer [name]  ZDG:[id number] DOB: 03-21-1951 DOA: 08/10/2022 PCP: Steele Sizer, MD   Assessment & Plan:   Principal Problem:   Bacteremia due to Streptococcus pneumoniae Active Problems:   Sepsis ([place])   Traumatic rhabdomyolysis ([place])   Urinary tract infection   Acute pain of left knee   Ulcer of left foot due to type 2 diabetes mellitus ([place])   Uncontrolled type 2 diabetes mellitus with hyperglycemia, with long-term current use of insulin ([name])   Acute renal failure superimposed on stage 3b chronic kidney disease ([place])   Obstructive sleep apnea   Adult hypothyroidism   Morbid obesity with BMI of 40.0-44.9, adult ([name])   (HFpEF) heart failure with preserved ejection fraction ([name])   Major depressive disorder, recurrent episode, moderate ([name])   Metabolic acidosis   Multiple falls   Gouty arthritis   Pyogenic arthritis of left knee joint ([name])   Diabetic ulcer of left midfoot associated with diabetes mellitus due to underlying condition, limited to breakdown of skin ([place])   Seizure ([name])   Posterior reversible encephalopathy syndrome  Assessment and Plan: Encephalopathy: etiology unclear, possible PRES vs meningitis. Rapid response 12/10 for new severe headache, AMS.  Had seizure as well. Unsuccessful LP attempt 12/10. Encephalopathy resolved.    Bacteremia: blood cxs are growing streptococcus pneumoniae. Continue on IV rocephin x 4 weeks as per ID. TEE neg   Traumatic rhabdomyolysis: CK continues to trend down   Frequent falls: PT/OT recs SNF. Waiting on insurance auth    Sepsis: secondary to bacteremia. Met criteria w/  fever, tachycardia, leukocytosis and AKI.   Continue on IV Rocephin. Repeat blood cxs NGTD. Sepsis resolved  Hypokalemia: WNL today   Hypomagnesemia: WNL today     Acute pain of left knee: secondary to gout flare. S/p left knee aspiration that was consistent w/ gout. Continue on allopurinol. S/p po steroids & steroid injection on  12/8.  Ulcer of left foot: secondary to DM2. Secondary to hallux malleus contracture leading to excessive stress to the distal tip of the hallux and therefore ulceration as per podiatry. Weightbearing as tolerated with surgical shoe.  No surgery plans per podiatry. Continue on IV abxs. Outpatient podiatry follow-up with Dr. Amalia Hailey in 1 week post discharge.    DM2: poorly controlled. Continue on glargine, SSI w/ accuchecks    AKI on CKDIIIb: likely secondary to sepsis & possible rhabdomyolysis. Cr is labile    Major depressive disorder: moderate. Continue on trazodone, sertraline. Holding bupropion   Chronic diastolic CHF: appears euvolemic. Continue on metoprolol. Holding lasix.    Morbid obesity: BMI 40.0-44.9. Complicates overall care & prognosis    Hypothyroidism: continue on synthroid    OSA: CPAP qhs     Mucosal bleeding: etiology unclear. Continue w/ supportive care   HTN: continue on BB, CCB   Thrombocytosis: WNL today   ACD: likely secondary to CKD. No need for a transfusion currently      DVT prophylaxis: SCDs Code Status: DNR Family Communication:  Disposition Plan: ID wants pt to stay inpatient until IV rocephin dosing for meningitis is complete (until 08/27/22)   Level of care: Telemetry Medical  Status is: Inpatient Remains inpatient appropriate because: ID wants pt to stay inpatient until IV rocephin dosing for meningitis is complete (until 08/27/22)     Consultants:  ID Neuro   Procedures:   Antimicrobials: rocephin    Subjective: Pt c/o fatigue   Objective: Vitals:   08/24/22 0010 08/24/22 0147 08/24/22  0419 08/24/22 0446  BP: (!) 151/62  (!) 139/55   Pulse: 69  (!) 57   Resp: _0 Temp: 98.3 F (36.8 C)  98.1 F (36.7 C)   TempSrc: Oral     SpO2: 100%  99%   Weight:      Height:        Intake/Output Summary (Last 24 hours) at 08/24/2022 0754 Last data filed at 08/24/2022 1791 Gross per 24 hour  Intake 199.5 ml  Output 2500  ml  Net -2300.5 ml   Filed Weights   08/10/22 1801  Weight: 109.3 kg    Examination:  General exam: Appears comfortable   Respiratory system: clear breath sounds b/l  Cardiovascular system: S1/S2+. No rubs or clicks  Gastrointestinal system: abd is soft, NT, ND & normal bowel sounds  Central nervous system: alert and oriented. Moves all extremities  Psychiatry: judgement and insight appears at baseline. Flat mood and affect     Data Reviewed: I have personally reviewed following labs and imaging studies  CBC: Recent Labs  Lab 08/19/22 0356 08/22/22 0351 08/23/22 0447 08/24/22 0315  WBC 18.6* 18.9* 16.3* 16.2*  NEUTROABS 13.4*  --   --   --   HGB 9.6* 8.9* 10.0* 9.1*  HCT 28.7* 27.2* 30.0* 27.5*  MCV 84.2 84.0 84.0 85.1  PLT 312 491* 477* 505   Basic Metabolic Panel: Recent Labs  Lab 08/20/22 0558 08/21/22 0502 08/22/22 0351 08/23/22 0447 08/24/22 0315  NA 136 139 134* 139 138  K 3.3* 4.0 3.0* 3.8 4.1  CL 106 107 103 107 108  CO2 _1 GLUCOSE 222* 248* 304* 180* 232*  BUN 31* 30* 29* 25* 29*  CREATININE 1.30* 1.17* 1.24* 1.14* 1.21*  CALCIUM 8.7* 8.7* 8.7* 8.9 8.7*  MG  --   --  1.7  --  1.9   GFR: Estimated Creatinine Clearance: 48.2 mL/min (A) (by C-G formula based on SCr of 1.21 mg/dL (H)). Liver Function Tests: No results for input(s): "AST", "ALT", "ALKPHOS", "BILITOT", "PROT", "ALBUMIN" in the last 168 hours. No results for input(s): "LIPASE", "AMYLASE" in the last 168 hours. No results for input(s): "AMMONIA" in the last 168 hours. Coagulation Profile: No results for input(s): "INR", "PROTIME" in the last 168 hours. Cardiac Enzymes: No results for input(s): "CKTOTAL", "CKMB", "CKMBINDEX", "TROPONINI" in the last 168 hours. BNP (last 3 results) No results for input(s): "PROBNP" in the last 8760 hours. HbA1C: No results for input(s): "HGBA1C" in the last 72 hours. CBG: Recent Labs  Lab 08/22/22 2014 08/23/22 0915 08/23/22 1142  08/23/22 1601 08/23/22 2010  GLUCAP 202* 188* 184* 223* 253*   Lipid Profile: No results for input(s): "CHOL", "HDL", "LDLCALC", "TRIG", "CHOLHDL", "LDLDIRECT" in the last 72 hours. Thyroid Function Tests: No results for input(s): "TSH", "T4TOTAL", "FREET4", "T3FREE", "THYROIDAB" in the last 72 hours. Anemia Panel: No results for input(s): "VITAMINB12", "FOLATE", "FERRITIN", "TIBC", "IRON", "RETICCTPCT" in the last 72 hours. Sepsis Labs: No results for input(s): "PROCALCITON", "LATICACIDVEN" in the last 168 hours.  Recent Results (from the past 240 hour(s))  Aerobic Culture w Gram Stain (superficial specimen)     Status: None   Collection Time: 08/14/22 11:00 PM   Specimen: Foot; Wound  Result Value Ref Range Status   Specimen Description   Final    FOOT Performed at [facility], [address] [place], [place] [address]    Special Requests   Final    NONE Performed  at [facility], [address] [place], [place] [address]    Gram Stain   Final    NO WBC SEEN NO ORGANISMS SEEN Performed at [facility], [place] [address] [place], [place] [address]    Culture RARE STAPHYLOCOCCUS AUREUS  Final   Report Status 08/18/2022 FINAL  Final   Organism ID, Bacteria STAPHYLOCOCCUS AUREUS  Final      Susceptibility   Staphylococcus aureus - MIC*    CIPROFLOXACIN <=0.5 SENSITIVE Sensitive     ERYTHROMYCIN 4 INTERMEDIATE Intermediate     GENTAMICIN <=0.5 SENSITIVE Sensitive     OXACILLIN <=0.25 SENSITIVE Sensitive     TETRACYCLINE >=16 RESISTANT Resistant     VANCOMYCIN <=0.5 SENSITIVE Sensitive     TRIMETH/SULFA <=10 SENSITIVE Sensitive     CLINDAMYCIN <=0.25 SENSITIVE Sensitive     RIFAMPIN <=0.5 SENSITIVE Sensitive     Inducible Clindamycin NEGATIVE Sensitive     * RARE STAPHYLOCOCCUS AUREUS         Radiology Studies: DG FL GUIDED LUMBAR PUNCTURE  Result Date: 08/22/2022 CLINICAL DATA:  Provided history: Acute encephalopathy. EXAM: DIAGNOSTIC  LUMBAR PUNCTURE UNDER FLUOROSCOPIC GUIDANCE COMPARISON:  Radiographs of the lumbar spine from the lumbar puncture attempted yesterday 08/21/2022. Lumbar spine MRI 11/09/2019. FLUOROSCOPY: Fluoroscopy time: 1 minute, 18 seconds (55.50 mGy). PROCEDURE: Due to the patient's altered mental status, Rushie Nyhan, NP obtained informed consent from the patient's POA (Darrel Lambe) prior to the procedure. This process included a discussion of procedural risks. The patient was position prone on the fluoroscopy table. An appropriate skin entry site was determined under fluoroscopy and marked. The operator donned sterile gloves and a mask. A time-out was performed. The skin entry site was prepped with Betadine and draped in the usual sterile fashion. 1% lidocaine was used for local anesthesia. Under fluoroscopic guidance, a 20 gauge 3.5 inch spinal needle was advanced into the thecal sac at the L4-L5 level. There was transient return of CSF into the needle hub. However, no CSF could be collected despite tilting the table, and despite needle repositioning. Subsequently, lumbar puncture was attempted at the L5-S1 level. However, the patient became agitated during this lumbar puncture attempt. At this point, it was felt unsafe to continue and the procedure was terminated. The procedure was performed by Rushie Nyhan, NP, and was supervised by Dr. Kellie Simmering. IMPRESSION: Fluoroscopically-guided lumbar puncture at the L4-L5 level, as described. Although there was transient CSF return into the needle hub, no CSF could be collected. In reviewing the patient's prior lumbar spine MRI of 11/09/2019, this poor CSF return is likely related to prominent epidural lipomatosis resulting in diffuse narrowing of the lumbar thecal sac. Fluoroscopically-guided lumbar puncture was also attempted at the L5-S1 level. However, the patient became agitated during this lumbar puncture attempt. At this point, it was felt unsafe to continue and  the procedure was terminated. Electronically Signed   By: Kellie Simmering D.O.   On: 08/22/2022 14:25        Scheduled Meds:  (feeding supplement) PROSource Plus  30 mL Oral TID BM   allopurinol  50 mg Oral Daily   amLODipine  10 mg Oral Daily   vitamin C  500 mg Oral BID   bupivacaine(PF)  10 mL Intra-articular Once   ezetimibe  10 mg Oral Daily   hydrochlorothiazide  25 mg Oral Daily   icosapent Ethyl  2 g Oral BID   insulin aspart  0-20 Units Subcutaneous TID WC   insulin  aspart  0-5 Units Subcutaneous QHS   insulin aspart  3 Units Subcutaneous TID WC   insulin glargine-yfgn  25 Units Subcutaneous QHS   levothyroxine  25 mcg Oral Once per day on Mon Tue Wed Thu Fri Sat   And   levothyroxine  50 mcg Oral Once per day on Sun   metoprolol tartrate  50 mg Oral BID   modafinil  100 mg Oral Daily   multivitamin with minerals  1 tablet Oral Daily   pantoprazole  40 mg Oral Daily   pregabalin  150 mg Oral BID   sertraline  150 mg Oral Daily   triamcinolone acetonide  80 mg Intra-articular Once   zinc sulfate  220 mg Oral Daily   Continuous Infusions:  cefTRIAXone (ROCEPHIN)  IV 2 g (08/24/22 0324)     LOS: 14 days    Time spent: 25 mins     Wyvonnia Dusky, MD [facility] Pager [phone]  If 7PM-7AM, please contact night-coverage [email or web address] 08/24/2022, 7:54 AM

## 2022-08-24 NOTE — Progress Notes (Signed)
Neurology Progress Note  Patient ID / pertinent hospital course: Summer [name] is a 71 y.o. female with medical history significant for Diabetes with polyneuropathy with a chronic left foot wound previously seen at the wound care clinic (MSSA on culture here), OSA, CKD stage IIIb, depression, HTN, HFpEF, class III obesity, OSA and hypothyroidism, presented to the ED on 08/10/2022 via EMS with frequent falls over the 3 day prior to admission, found to have a L knee effusion and fever with CK of 6435, started on broad spectrum Abx. 12/1 knee aspirate c/w gout for which steroids were started 12/3 ID consulted for strep pneumo bacteremia (unclear source)  12/4 allopurinol started for gout 12/5 steroids stopped 12/6 TEE negative for endocarditis 12/10 Neurology consulted for severe HA, failed bedside LP, broadened to meningitic coverage w/ ceftriaxone, decadron, acyclovir; MRI w/ c/f PRES, Wellbutrin stopped, keppra 3000 mg IV loaded 12/11 Failed IR LP due to agitation, EEG w/ intermittent diffuse slowing and focal right slowing, Keppra 500 mg BID considered but not ordered, mental status rapidly improving in the afternoon per ID team 12/12 Failed second IR LP attempt, scant CSF flash in needle, felt to be secondary to diffuse epidural lipomatosis narrowing her lumbar thecal sac severely; acyclovir stopped in discussion with ID 12/13 Mental status improving   Subjective: - Headache this morning 8/10 but resolved, sitting up comfortably in bed - left knee pain improving - Generally feeling weak no focal complaints  Exam: Current vital signs: BP (!) 136/59 (BP Location: Right Arm)   Pulse 63   Temp 98.5 F (36.9 C)   Resp 15   Ht '5\' 2"'$  (1.575 m)   Wt 109.3 kg   SpO2 99%   BMI 44.08 kg/m  Vital signs in last 24 hours: Temp:  [98.1 F (36.7 C)-98.8 F (37.1 C)] 98.5 F (36.9 C) (12/14 0852) Pulse Rate:  [57-71] 63 (12/14 0852) Resp:  [15-20] 15 (12/14 0852) BP: (136-151)/(55-62) 136/59  (12/14 0852) SpO2:  [99 %-100 %] 99 % (12/14 0852)   Gen: In bed, comfortable  Resp: non-labored breathing, no grossly audible wheezing Head: Scabbed over ulcers on the left upper lip Cardiac: Perfusing extremities well  Abd: soft, nt Musculoskeletal: Healing scabs on bilateral knees, no significant erythema or large effusion or calor left versus right. Left foot bandage in place  Neuro: MS: Oriented to person, place, situation, following commands, speech fluent CN: tracking examiner, pupils equal, face symmetric Motor: 4/5 throughout, pain/effort limited, non focal  Pertinent Labs:  Basic Metabolic Panel: Recent Labs  Lab 08/20/22 0558 08/21/22 0502 08/22/22 0351 08/23/22 0447 08/24/22 0315  NA 136 139 134* 139 138  K 3.3* 4.0 3.0* 3.8 4.1  CL 106 107 103 107 108  CO2 '23 22 23 25 25  '$ GLUCOSE 222* 248* 304* 180* 232*  BUN 31* 30* 29* 25* 29*  CREATININE 1.30* 1.17* 1.24* 1.14* 1.21*  CALCIUM 8.7* 8.7* 8.7* 8.9 8.7*  MG  --   --  1.7  --  1.9    Estimated Creatinine Clearance: 48.2 mL/min (A) (by C-G formula based on SCr of 1.21 mg/dL (H)).   CBC: Recent Labs  Lab 08/19/22 0356 08/22/22 0351 08/23/22 0447 08/24/22 0315  WBC 18.6* 18.9* 16.3* 16.2*  NEUTROABS 13.4*  --   --   --   HGB 9.6* 8.9* 10.0* 9.1*  HCT 28.7* 27.2* 30.0* 27.5*  MCV 84.2 84.0 84.0 85.1  PLT 312 491* 477* 395     Coagulation Studies: No results  for input(s): "LABPROT", "INR" in the last 72 hours.    Impression: Exam improving even with holding acyclovir. Etiology of seizure would be provoked in the setting of PRES, which also explains focality.  Etiology of PRES secondary to uncontrolled hypertension versus potentially serotonin syndrome versus steroids received for c/f S. Pneumo meningitis First dose of dexamethasone was 10 mg at 5 am on 12/10, with event occurring at 0850 AM In this case patient was exposed to sertraline (currently taking) as well as welbutrin (stopped) and  trazadone as needed (100 mg on 12/09 and 12/11 nightly).  All of these are listed are home medications with trazodone increased from 50 to 100 mg nightly in Oct 2022, sertraline started in 2020 after MRI negative tPA treated left sided weakness / nausea / dysarthria iso BP 174/73,   Long term AEDs not necessarily indicated at this time, but will continue patient's pregabalin she has been using for pain as it also has anti-seizure properties  Recommendations: -Continue to hold Wellbutrin -Okay to continue trazodone and sertraline at this time -Continue pregabalin at renally appropriate max dose of 150 mg BID,  -Goal BP normotension  -Agree with ID to complete S. Pneumo meningitis coverage empirically given inability to sample CSF, x7 days and note longer tx planned at lower dose for c/f septic arthritis -Patient prefers outpatient follow-up with [facility] neurology, should be given number to call for appointment [phone] -Outpatient MRI brain in 2-3 months to confirm no seizure focus and resolution of PRES findings -Seizure precautions discussed, please include in discharge instructions as detailed below -Neurology will be available as needed going forward, please reach out if additional questions or concerns arise  Standard seizure precautions: Per [facility] statutes, patients with seizures are not allowed to drive until  they have been seizure-free for six months. Use caution when using heavy equipment or power tools. Avoid working on ladders or at heights. Take showers instead of baths. Ensure the water temperature is not too high on the home water heater. Do not go swimming alone. When caring for infants or small children, sit down when holding, feeding, or changing them to minimize risk of injury to the child in the event you have a seizure.  To reduce risk of seizures, maintain good sleep hygiene avoid alcohol and illicit drug use, take all anti-seizure medications as  prescribed.    Lesleigh Noe MD-PhD [facility] [phone]   Greater than 35 minutes were spent in care of this patient today, the majority at bedside / in direct care

## 2022-08-24 NOTE — TOC Progression Note (Addendum)
Transition of Care [facility]) - Progression Note    Patient Details  Name: Summer [name] MRN: [id number] Date of Birth: Sep 02, 1951  Transition [facility] CM/SW [place], [place] Phone Number: 08/24/2022, 10:46 AM  Clinical Narrative:     As per ID, the patient will not be ready to transfer to [facility] until 12/17 due to her current IV medication. She will still require IV medication at the SNF.  TOC updated the patient and SNF. TOC updating SNF on the IV medication. The SNF can assist with continued IV medication post discharge.  Expected Discharge Plan: [place] Barriers to Discharge: Continued Medical Work up  Expected Discharge Plan and Services Expected Discharge Plan: [place] In-house Referral: Clinical Social Work   Post Acute Care Choice: [place] Living arrangements for the past 2 months: Single Family Home                                       Social Determinants of Health (SDOH) Interventions    Readmission Risk Interventions     No data to display

## 2022-08-25 DIAGNOSIS — E1165 Type 2 diabetes mellitus with hyperglycemia: Secondary | ICD-10-CM | POA: Diagnosis not present

## 2022-08-25 DIAGNOSIS — N170 Acute kidney failure with tubular necrosis: Secondary | ICD-10-CM | POA: Diagnosis not present

## 2022-08-25 DIAGNOSIS — A419 Sepsis, unspecified organism: Secondary | ICD-10-CM | POA: Diagnosis not present

## 2022-08-25 DIAGNOSIS — M109 Gout, unspecified: Secondary | ICD-10-CM | POA: Diagnosis not present

## 2022-08-25 DIAGNOSIS — E11621 Type 2 diabetes mellitus with foot ulcer: Secondary | ICD-10-CM | POA: Diagnosis not present

## 2022-08-25 DIAGNOSIS — Z794 Long term (current) use of insulin: Secondary | ICD-10-CM | POA: Diagnosis not present

## 2022-08-25 DIAGNOSIS — L97529 Non-pressure chronic ulcer of other part of left foot with unspecified severity: Secondary | ICD-10-CM | POA: Diagnosis not present

## 2022-08-25 DIAGNOSIS — R6520 Severe sepsis without septic shock: Secondary | ICD-10-CM | POA: Diagnosis not present

## 2022-08-25 DIAGNOSIS — G9341 Metabolic encephalopathy: Secondary | ICD-10-CM | POA: Insufficient documentation

## 2022-08-25 DIAGNOSIS — B953 Streptococcus pneumoniae as the cause of diseases classified elsewhere: Secondary | ICD-10-CM | POA: Diagnosis not present

## 2022-08-25 DIAGNOSIS — R569 Unspecified convulsions: Secondary | ICD-10-CM | POA: Diagnosis not present

## 2022-08-25 DIAGNOSIS — R7881 Bacteremia: Secondary | ICD-10-CM | POA: Diagnosis not present

## 2022-08-25 DIAGNOSIS — M00162 Pneumococcal arthritis, left knee: Secondary | ICD-10-CM

## 2022-08-25 LAB — IMMUNOGLOBULINS A/E/G/M, SERUM
IgA: 122 mg/dL (ref 64–422)
IgE (Immunoglobulin E), Serum: 5 IU/mL — ABNORMAL LOW (ref 6–495)
IgG (Immunoglobin G), Serum: 795 mg/dL (ref 586–1602)
IgM (Immunoglobulin M), Srm: 130 mg/dL (ref 26–217)

## 2022-08-25 LAB — CBC
HCT: 27.7 % — ABNORMAL LOW (ref 36.0–46.0)
Hemoglobin: 9 g/dL — ABNORMAL LOW (ref 12.0–15.0)
MCH: 28 pg (ref 26.0–34.0)
MCHC: 32.5 g/dL (ref 30.0–36.0)
MCV: 86.3 fL (ref 80.0–100.0)
Platelets: 359 10*3/uL (ref 150–400)
RBC: 3.21 MIL/uL — ABNORMAL LOW (ref 3.87–5.11)
RDW: 16.5 % — ABNORMAL HIGH (ref 11.5–15.5)
WBC: 13.9 10*3/uL — ABNORMAL HIGH (ref 4.0–10.5)
nRBC: 0 % (ref 0.0–0.2)

## 2022-08-25 LAB — GLUCOSE, CAPILLARY
Glucose-Capillary: 200 mg/dL — ABNORMAL HIGH (ref 70–99)
Glucose-Capillary: 354 mg/dL — ABNORMAL HIGH (ref 70–99)
Glucose-Capillary: 192 mg/dL — ABNORMAL HIGH (ref 70–99)
Glucose-Capillary: 285 mg/dL — ABNORMAL HIGH (ref 70–99)

## 2022-08-25 LAB — BASIC METABOLIC PANEL
Anion gap: 5 (ref 5–15)
BUN: 31 mg/dL — ABNORMAL HIGH (ref 8–23)
CO2: 27 mmol/L (ref 22–32)
Calcium: 8.8 mg/dL — ABNORMAL LOW (ref 8.9–10.3)
Chloride: 106 mmol/L (ref 98–111)
Creatinine, Ser: 1.34 mg/dL — ABNORMAL HIGH (ref 0.44–1.00)
GFR, Estimated: 42 mL/min — ABNORMAL LOW (ref >60)
Glucose, Bld: 218 mg/dL — ABNORMAL HIGH (ref 70–99)
Potassium: 4.1 mmol/L (ref 3.5–5.1)
Sodium: 138 mmol/L (ref 135–145)

## 2022-08-25 LAB — MAGNESIUM: Magnesium: 1.7 mg/dL (ref 1.7–2.4)

## 2022-08-25 LAB — CK: Total CK: 33 U/L — ABNORMAL LOW (ref 38–234)

## 2022-08-25 MED ORDER — SODIUM CHLORIDE 0.9 % IV SOLN: INTRAVENOUS | Status: AC

## 2022-08-25 MED ORDER — INSULIN GLARGINE-YFGN 100 UNIT/ML ~~LOC~~ SOLN
30.0000 [IU] | Freq: Every day | SUBCUTANEOUS | Status: DC
  Administered 2022-08-25 – 2022-08-26 (×2): 30 [IU] via SUBCUTANEOUS
  Filled 2022-08-25 (×2): qty 0.3

## 2022-08-25 MED ORDER — MAGNESIUM SULFATE 2 GM/50ML IV SOLN
2.0000 g | Freq: Once | INTRAVENOUS | Status: AC
  Administered 2022-08-25: 2 g via INTRAVENOUS
  Filled 2022-08-25: qty 50

## 2022-08-25 MED ORDER — AMOXICILLIN 500 MG PO CAPS
1000.0000 mg | ORAL_CAPSULE | Freq: Three times a day (TID) | ORAL | Status: DC
  Administered 2022-08-27 – 2022-08-28 (×4): 1000 mg via ORAL
  Filled 2022-08-25 (×5): qty 2

## 2022-08-25 MED ORDER — INSULIN ASPART 100 UNIT/ML IJ SOLN
6.0000 [IU] | Freq: Three times a day (TID) | INTRAMUSCULAR | Status: DC
  Administered 2022-08-26 (×3): 6 [IU] via SUBCUTANEOUS
  Filled 2022-08-25 (×2): qty 1

## 2022-08-25 NOTE — Assessment & Plan Note (Signed)
Left foot/great toe ulceration without underlying osteomyelitis Seen by podiatry but was signed off on 12/1.  Thought to be due to hallux malleus contracture leading to excessive stress to the distal tip of the hallux and therefore ulceration.  Continue Betadine wet-to-dry dressing, weightbearing as tolerated with surgical shoe.  No surgery plans per podiatry.  Outpatient podiatry follow-up with Dr. Amalia Hailey in 1 week post discharge Continue antibiotics per ID

## 2022-08-25 NOTE — Assessment & Plan Note (Signed)
Last A1c about 2 months ago was 7.5.  Increase insulin Semglee to 30 units subcu daily and NovoLog 6 unit SQ 3 times daily with each meals.  Continue SSI.  Diabetic nurse following

## 2022-08-25 NOTE — Progress Notes (Signed)
ID Pt is doing well Partner at bed side    O/e awake and alert  No distress Patient Vitals for the past 24 hrs:  BP Temp Temp src Pulse Resp SpO2  08/25/22 0753 (!) 146/67 98.5 F (36.9 C) -- (!) 59 20 100 %  08/25/22 0515 -- -- -- -- 20 --  08/25/22 0506 (!) 148/57 99 F (37.2 C) Oral (!) 58 20 98 %  08/25/22 0425 -- -- -- -- 20 --  08/25/22 0424 (!) 148/63 98 F (36.7 C) Oral (!) 57 20 100 %  08/25/22 0031 (!) 132/51 98.8 F (37.1 C) Oral (!) 58 18 100 %  08/24/22 2337 -- -- -- -- 18 --  08/24/22 2201 -- -- -- -- 20 --  08/24/22 2001 (!) 141/47 98.5 F (36.9 C) Oral 65 20 99 %  08/24/22 1555 (!) 125/45 98.6 F (37 C) Oral 61 16 99 %  08/24/22 1201 (!) 133/49 98.5 F (36.9 C) Oral 62 16 100 %   No neck rigidity Upper lip- crust  Much better. Chest b/l air entry Hs s1s2 Abd soft Left knee mobility much better Left foot- healing well Charcot deformity left foot CNS non focal  Labs    Latest Ref Rng & Units 08/25/2022    5:53 AM 08/24/2022    3:15 AM 08/23/2022    4:47 AM  CBC  WBC 4.0 - 10.5 K/uL 13.9  16.2  16.3   Hemoglobin 12.0 - 15.0 g/dL 9.0  9.1  10.0   Hematocrit 36.0 - 46.0 % 27.7  27.5  30.0   Platelets 150 - 400 K/uL 359  395  477        Latest Ref Rng & Units 08/25/2022    5:53 AM 08/24/2022    3:15 AM 08/23/2022    4:47 AM  CMP  Glucose 70 - 99 mg/dL 218  232  180   BUN 8 - 23 mg/dL '31  29  25   '$ Creatinine 0.44 - 1.00 mg/dL 1.34  1.21  1.14   Sodium 135 - 145 mmol/L 138  138  139   Potassium 3.5 - 5.1 mmol/L 4.1  4.1  3.8   Chloride 98 - 111 mmol/L 106  108  107   CO2 22 - 32 mmol/L '27  25  25   '$ Calcium 8.9 - 10.3 mg/dL 8.8  8.7  8.9     Micro 11/30 BC Strep pneumo 12/1 BC- NG Left foot wound- Ng so far  Impression/recommendation   ?71 yr female presenting with multiple falls And Left knee pain for the past 3 daysl  Acute onset of headache and seizure and was encephalopathic over the weekend.- recovered  was due to PRES as seen  on MRI Not herpes encephalitis  Treated like Strep pneumo meninigits eventhoug less likley - as LP could not be done will complete 7 days of meningitis dose of ceftriaxone which will be tomorrow.  Strep pneumo bacteremia Source currently not clear- TEE negative for  endocarditis From 08/27/22 she will go on amoxicillin 1 gram Po q 8 for 2 more weeks to treat possible left septic arthritis  Left knee acute gouty arthritis with possible septic arthritis. Much better after intraarticular steroid. Will give 4 weeks of antibiotic   diabetes mellitus with neuropathy, charcot foot and wound left foot X 2 The left lateral foot wound is much better MSSA in culture   Rhabdomyolysis- due to fall- resolved   AKI   Charcot foot-  H/o left Ankle surgey- has hardware     DM  Discussed the management with patient and  partner and pharmacist ID will follow her peripherally this weekend Call if needed

## 2022-08-25 NOTE — Assessment & Plan Note (Signed)
Continue sertraline and trazodone.  Holding bupropion

## 2022-08-25 NOTE — Assessment & Plan Note (Signed)
Patient with severe 10/10 pain left knee on admission.  Seen by Ortho who did aspiration of left knee with fluid consistent with gout Treated with 3 days of oral steroids.  Pain much better controlled now.   -Status post steroid injection on 12/8 by Ortho.  Continue allopurinol

## 2022-08-25 NOTE — Assessment & Plan Note (Signed)
Frequent falls CK over 6000 on admission Resolved with hydration  PT, OT recommend SNF.  TOC working on placement

## 2022-08-25 NOTE — Inpatient Diabetes Management (Signed)
Inpatient Diabetes Program Recommendations  AACE/ADA: New Consensus Statement on Inpatient Glycemic Control (2015)  Target Ranges:  Prepandial:   less than 140 mg/dL      Peak postprandial:   less than 180 mg/dL (1-2 hours)      Critically ill patients:  140 - 180 mg/dL   Lab Results  Component Value Date   GLUCAP 200 (H) 08/25/2022   HGBA1C 7.5 06/06/2022    Review of Glycemic Control  Latest Reference Range & Units 08/24/22 08:51 08/24/22 11:55 08/24/22 16:20 08/24/22 20:47 08/25/22 07:51  Glucose-Capillary 70 - 99 mg/dL 227 (H) 335 (H) 335 (H) 190 (H) 319 (H) 200 (H)  (H): Data is abnormally high  Diabetes history: DM 2 Outpatient Diabetes medications: 70/30 100 units q evening, metformin 1000 mg Daily Current orders for Inpatient glycemic control:  Semglee 25 units qhs Novolog 3 units tid meal coverage Novolog 0-20 units tid + hs   Inpatient Diabetes Program Recommendations:     Please consider: Semglee to 30 units Novolog meal coverage to 6 units tid if eating >50% of meals  Will continue to follow while inpatient.  Thank you, Reche Dixon, MSN, [place] Diabetes Coordinator Inpatient Diabetes Program [phone] (team pager from 8a-5p)

## 2022-08-25 NOTE — Telephone Encounter (Signed)
Patient is calling she is currently admitted to [facility] and she said she wants to talk to Dr Amalia Hailey it is messed up again. The  L great toe has an open wound on the end of it.  She would like to speak to Dr Amalia Hailey asap . She is in Rm 115 A at [facility]

## 2022-08-25 NOTE — Assessment & Plan Note (Addendum)
Likely due to PRES per neurology.  No AED need

## 2022-08-25 NOTE — Assessment & Plan Note (Signed)
Complicating factor to overall prognosis and care 

## 2022-08-25 NOTE — Assessment & Plan Note (Signed)
Secondary to sepsis and possibly rhabdo Creatinine slowly worsening.  Monitor, will start gentle IV fluids  Lab Results  Component Value Date   CREATININE 1.34 (H) 08/25/2022   CREATININE 1.21 (H) 08/24/2022   CREATININE 1.14 (H) 08/23/2022

## 2022-08-25 NOTE — Assessment & Plan Note (Signed)
Unclear source Sepsis criteria includes fever, tachycardia, leukocytosis and AKI.  Lactic acid was normal Blood cultures growing strep pneumo, TEE negative Unclear source. Starting 08/27/2022 she can start amoxicillin 1 g p.o. 3 times daily for 2 more weeks for possible left septic arthritis treatment per ID Continue IV Rocephin for now

## 2022-08-25 NOTE — Assessment & Plan Note (Signed)
Continue levothyroxine 

## 2022-08-25 NOTE — Assessment & Plan Note (Signed)
Continue CPAP.  

## 2022-08-25 NOTE — Telephone Encounter (Signed)
I am unable to see this patient today because I have to fly out this evening from [place]. I've added Dr. Loel Lofty.  Dr. Loel Lofty, Dr. Posey Pronto consulted on this patient 08/11/2022 and signed off.  Might need to be seen again based on the infectious disease notes.  Thanks, Dr. Amalia Hailey

## 2022-08-25 NOTE — Assessment & Plan Note (Signed)
Continue IV Rocephin for now.  ID following, unclear source.  TEE negative for endocarditis.

## 2022-08-25 NOTE — Progress Notes (Signed)
Progress Note   Patient: Summer [name] [id number]:[id number] DOB: 1951-07-09 DOA: 08/10/2022     15 DOS: the patient was seen and examined on 08/25/2022   Brief hospital course:  Summer [name] is a 71 y.o. female with medical history significant for Diabetes with polyneuropathy with a chronic left foot wound previously seen at the wound care clinic, OSA, CKD stage IIIb, depression, HTN, HFpEF, class III obesity, OSA and hypothyroidism,Who presents to the ED via EMS with frequent falls over the past 3 days, now with severe left knee pain.  Upon arriving the hospital, temperature was 101.1, HR 117, creatinine 1.89.  CK level 6435.  UA abnormal, x-ray showed mild to moderate left knee joint effusion. Patient was started on broad spectrum antibiotics for possible sepsis.  Seen by orthopedics, knee tap results support diagnosis of gout.    12/1: Podiatry recommends conservative management with dressing changes and weightbearing as tolerated with surgical shoe.  Left knee aspiration by Ortho the fluid suggestive of gout.  Started on steroids 12/2: PT, OT eval recommends SNF 12/3: Waiting for SNF placement, TOC aware, CK improving.  ID consult for strep pneumo bacteremia 12/4: Not motivated to work with therapy.  Palliative care consult, 2D echo and 2 sets of blood culture per ID, starting allopurinol for gout 12/5: Ortho reeval, TEE, stopped steroids 12/6 TEE negative for endocarditis 12/10 Neurology consulted for severe HA, failed bedside LP, broadened to meningitic coverage w/ ceftriaxone, decadron, acyclovir; MRI w/ c/f PRES, Wellbutrin stopped, keppra 3000 mg IV loaded 12/11 Failed IR LP due to agitation, EEG w/ intermittent diffuse slowing and focal right slowing, Keppra 500 mg BID considered but not ordered, mental status rapidly improving in the afternoon  12/12 Failed second IR LP attempt, scant CSF flash in needle, felt to be secondary to diffuse epidural lipomatosis narrowing her lumbar thecal sac  severely; acyclovir stopped per ID & Neuro 12/13: Mental status improving 12/14: Seizure likely secondary to PRES (confirmed on MRI) per neurology 12/15: Mental status at baseline, per ID switch to amoxicillin on 08/27/2022 for 2 more weeks for treatment of possible left septic arthritis   Assessment and Plan: * Bacteremia due to Streptococcus pneumoniae Continue IV Rocephin for now.  ID following, unclear source.  TEE negative for endocarditis.  Traumatic rhabdomyolysis ([name]) Frequent falls CK over 6000 on admission Resolved with hydration  PT, OT recommend SNF.  TOC working on placement  Sepsis [facility]) Unclear source Sepsis criteria includes fever, tachycardia, leukocytosis and AKI.  Lactic acid was normal Blood cultures growing strep pneumo, TEE negative Unclear source. Starting 08/27/2022 she can start amoxicillin 1 g p.o. 3 times daily for 2 more weeks for possible left septic arthritis treatment per ID Continue IV Rocephin for now  Acute pain of left knee Patient with severe 10/10 pain left knee on admission.  Seen by Ortho who did aspiration of left knee with fluid consistent with gout Treated with 3 days of oral steroids.  Pain much better controlled now.   -Status post steroid injection on 12/8 by Ortho.  Continue allopurinol  Ulcer of left foot due to type 2 diabetes mellitus ([place]) Left foot/great toe ulceration without underlying osteomyelitis Seen by podiatry but was signed off on 12/1.  Thought to be due to hallux malleus contracture leading to excessive stress to the distal tip of the hallux and therefore ulceration.  Continue Betadine wet-to-dry dressing, weightbearing as tolerated with surgical shoe.  No surgery plans per podiatry.  Outpatient podiatry follow-up with  Dr. Amalia Hailey in 1 week post discharge Continue antibiotics per ID  Uncontrolled type 2 diabetes mellitus with hyperglycemia, with long-term current use of insulin ([name]) Last A1c about 2 months ago was 7.5.   Increase insulin Semglee to 30 units subcu daily and NovoLog 6 unit SQ 3 times daily with each meals.  Continue SSI.  Diabetic nurse following  Acute renal failure superimposed on stage 3b chronic kidney disease ([place]) Secondary to sepsis and possibly rhabdo Creatinine slowly worsening.  Monitor, will start gentle IV fluids  Lab Results  Component Value Date   CREATININE 1.34 (H) 08/25/2022   CREATININE 1.21 (H) 08/24/2022   CREATININE 1.14 (H) 08/23/2022     Acute metabolic encephalopathy Likely secondary to seizure from PRES confirmed on MRI.  Mental status back to baseline.  Long-term AED not necessarily indicated per neurology continue Lyrica (long-term home medication) which may have antiseizure properties per neurology   Seizure [facility]) Likely due to PRES per neurology.  No AED need  Major depressive disorder, recurrent episode, moderate ([name]) Continue sertraline and trazodone.  Holding bupropion  (HFpEF) heart failure with preserved ejection fraction ([name]) Clinically euvolemic to dry continue metoprolol.  Hold HCTZ and start gentle hydration due to worsening renal function now  Morbid obesity with BMI of 40.0-44.9, adult ([place]) Complicating factor to overall prognosis and care.    Adult hypothyroidism Continue levothyroxine.  Obstructive sleep apnea Continue CPAP.        Subjective: Mental status at baseline today. No new issues. Hoping to talk with TOC for SNF  Physical Exam: Vitals:   08/25/22 0506 08/25/22 0515 08/25/22 0753 08/25/22 1618  BP: (!) 148/57  (!) 146/67 (!) 133/50  Pulse: (!) 58  (!) 59 63  Resp: '20 20 20 16  '$ Temp: 99 F (37.2 C)  98.5 F (36.9 C) 98.1 F (36.7 C)  TempSrc: Oral     SpO2: 98%  100% 96%  Weight:      Height:       General exam: Appears comfortable   Respiratory system: clear breath sounds b/l  Cardiovascular system: S1/S2+. No rubs or clicks  Gastrointestinal system: abd is soft, benign Central nervous system: alert and  oriented. Non-focal Psychiatry: judgement and insight appears at baseline.  Extremities: Charcot deformity of left foot Data Reviewed:  Sugar in 300s  Family Communication: None  Disposition: Status is: Inpatient Remains inpatient appropriate because: mgmt of Bacteremia and waiting for placement  Planned Discharge Destination: Skilled nursing facility   DVT prophylaxis-Lovenox Time spent: 35 minutes  Author: Max Sane, MD 08/25/2022 7:26 PM  For on call review [email or web address].

## 2022-08-25 NOTE — Assessment & Plan Note (Signed)
Likely secondary to seizure from PRES confirmed on MRI.  Mental status back to baseline.  Long-term AED not necessarily indicated per neurology continue Lyrica (long-term home medication) which may have antiseizure properties per neurology

## 2022-08-25 NOTE — Assessment & Plan Note (Signed)
Clinically euvolemic to dry continue metoprolol.  Hold HCTZ and start gentle hydration due to worsening renal function now

## 2022-08-26 DIAGNOSIS — R7881 Bacteremia: Secondary | ICD-10-CM | POA: Diagnosis not present

## 2022-08-26 DIAGNOSIS — B953 Streptococcus pneumoniae as the cause of diseases classified elsewhere: Secondary | ICD-10-CM | POA: Diagnosis not present

## 2022-08-26 LAB — CBC
HCT: 27.2 % — ABNORMAL LOW (ref 36.0–46.0)
Hemoglobin: 8.9 g/dL — ABNORMAL LOW (ref 12.0–15.0)
MCH: 28.1 pg (ref 26.0–34.0)
MCHC: 32.7 g/dL (ref 30.0–36.0)
MCV: 85.8 fL (ref 80.0–100.0)
Platelets: 340 10*3/uL (ref 150–400)
RBC: 3.17 MIL/uL — ABNORMAL LOW (ref 3.87–5.11)
RDW: 16.4 % — ABNORMAL HIGH (ref 11.5–15.5)
WBC: 11.7 10*3/uL — ABNORMAL HIGH (ref 4.0–10.5)
nRBC: 0 % (ref 0.0–0.2)

## 2022-08-26 LAB — GLUCOSE, CAPILLARY
Glucose-Capillary: 248 mg/dL — ABNORMAL HIGH (ref 70–99)
Glucose-Capillary: 365 mg/dL — ABNORMAL HIGH (ref 70–99)
Glucose-Capillary: 188 mg/dL — ABNORMAL HIGH (ref 70–99)
Glucose-Capillary: 219 mg/dL — ABNORMAL HIGH (ref 70–99)

## 2022-08-26 LAB — BASIC METABOLIC PANEL
Anion gap: 4 — ABNORMAL LOW (ref 5–15)
BUN: 34 mg/dL — ABNORMAL HIGH (ref 8–23)
CO2: 26 mmol/L (ref 22–32)
Calcium: 8.6 mg/dL — ABNORMAL LOW (ref 8.9–10.3)
Chloride: 105 mmol/L (ref 98–111)
Creatinine, Ser: 1.21 mg/dL — ABNORMAL HIGH (ref 0.44–1.00)
GFR, Estimated: 47 mL/min — ABNORMAL LOW (ref >60)
Glucose, Bld: 301 mg/dL — ABNORMAL HIGH (ref 70–99)
Potassium: 4.7 mmol/L (ref 3.5–5.1)
Sodium: 135 mmol/L (ref 135–145)

## 2022-08-26 LAB — CK: Total CK: 32 U/L — ABNORMAL LOW (ref 38–234)

## 2022-08-26 LAB — MAGNESIUM: Magnesium: 2.1 mg/dL (ref 1.7–2.4)

## 2022-08-26 NOTE — Progress Notes (Addendum)
Progress Note   Patient: Summer [name] MVH:[id number] DOB: 19-Jul-1951 DOA: 08/10/2022     16 DOS: the patient was seen and examined on 08/26/2022   Brief hospital course:  Summer [name] is a 72 y.o. female with a PMH of HTN, DM2 with neuropathy and charcot foot with chronic left foot wounds, Diastolic HF, and WUX3K who presented to the ED on 11/30 s/p fall with fevers, tachycardia, and left knee pain and joint effusion found to have Strep Pneumoniae bacteremia with an unclear source (left foot wound grew MSSA and left knee aspiration showed monosodium urate crystals, 93% PMNs, 38.1 WBCs with negative cultures).  TEE was negative for endocarditis.  Infectious Disease presumed bacteremia to be secondary to septic joint and recommended 4 weeks of antibiotics.  12/1: Ortho performed left joint aspiration. 12/6: TEE was negative for endocarditis. 12/8: Intra-articular steroid injection of the left knee 12/10: Patient had seizure and was loaded with Keppra.  MRI showed evidence of PRES. 12/11: IR unable to complete LP - first attempt. 12/12: IR unable to complete LP - second attempt.   12/13: Mental status improved.  Seizure was deemed to be secondary to PRES in the setting of gram positive bacteremia and not CNS infection.  Assessment and Plan: Bacteremia due to Streptococcus pneumoniae: The source was presumed to be secondary to septic knee joint despite negative cultures. - ID recommends completing 2 weeks of IV Ceftriaxone with stop date 12/17, followed by 2 weeks of PO Amoxicillin with stop date 12/31.  Appreciate assistance and recommendations. - Plan is to discharge to [facility] on 12/17 after completion of IV antibiotics.  Acute Gout of Left Knee: - S/P 12/8 intraarticular steroid injfection and 3 days of steroids. - Continue Allopurinol.  Diabetic Foot Wound of the left foot: - 12/1 Podiatry evaluated patient and did not recommend any surgical intervention. - Continue antibiotics as  above.  Seizure like activity on 12/10 secondary to PRES, first episode: MRI showed opacities in the parietal, frontal, and occipital lobes consistent with PRES.  Gram positive organisms are more commonly seen in infection associated PRES. - Continue to treat underlying infection. - Neurology does not recommend anti-seizure meds. - Continue home med Lyrica as this has been shown to have anti-seizure properties. - Hold Bupropion as this lowers the seizure threshold. - Repeat MRI of brain in 4 weeks to evaluate for resolution.  AKI on CKD2, resolved: - Creatinine is at baseline.  08/23/22  08/24/22 08/25/22 08/26/22  Creatinine 1.14 (H) 1.21 (H) 1.34 (H) 1.21 (H)   Generalized Weakness/ S/P Fall / Elevation in CK: - Symptoms improved after receiving fluids.  Physical Deconditioning: - PT/OT recommended SNF.  Chronic Diastolic Heart Failure: - Euvolemic.  Hypothyroidism: - Levothyroxine.  OSA/Morbid Obesity with BMI 44 kg/m2: - CPAP nightly.      Subjective: Summer [name] is doing well today.  Mental status is at baseline. She denies any fevers, chills, headaches, dizziness, chest pain, shortness of breath, joint pain, or back pain.  Physical Exam: Vitals:   08/25/22 2346 08/26/22 0531 08/26/22 0732 08/26/22 1137  BP: (!) 135/49 (!) 145/52 (!) 148/55 (!) 130/48  Pulse: 63 60 63 (!) 59  Resp: '16 16 20 16  '$ Temp: 98.5 F (36.9 C) 98.8 F (37.1 C) 98.7 F (37.1 C) 98.6 F (37 C)  TempSrc:      SpO2: 97% 98% 99% 97%  Weight:      Height:       Examination: General exam: alert  and oriented, mental status intact, appears a little fatigued and deconditioned HEENT: NCAT, PERRL Respiratory system: CTAB no WRR Cardiovascular system: Did not appreciate a murmur, regular, No JVD. Gastrointestinal system: No flank pain, Abdomen soft, NT,ND, BS+. Nervous System: No focal deficits. Extremities: Charcot deformity of left foot, distal peripheral pulses palpable.  Skin: No rashes, No  bruises, No icterus. MSK: Physical Deconditioning  Data Reviewed:  Last metabolic panel Lab Results  Component Value Date   GLUCOSE 301 (H) 08/26/2022   NA 135 08/26/2022   K 4.7 08/26/2022   CL 105 08/26/2022   CO2 26 08/26/2022   BUN 34 (H) 08/26/2022   CREATININE 1.21 (H) 08/26/2022   GFRNONAA 47 (L) 08/26/2022   CALCIUM 8.6 (L) 08/26/2022   PROT 6.7 10/03/2021   ALBUMIN 4.4 06/06/2022   LABGLOB 2.5 09/24/2020   AGRATIO 1.8 09/24/2020   BILITOT 0.4 10/03/2021   ALKPHOS 59 06/06/2022   AST 17 06/06/2022   ALT 13 06/06/2022   ANIONGAP 4 (L) 08/26/2022     Family Communication: None  Disposition: Status is: Inpatient Remains inpatient appropriate because: Need for IV antibiotics until 12/17.  Planned Discharge Destination: [facility]   DVT prophylaxis-Lovenox Time spent: 35 minutes  Author: George Hugh, MD 08/26/2022 3:17 PM  For on call review [email or web address].

## 2022-08-26 NOTE — Progress Notes (Signed)
Patient re-evaluated this AM. Left Knee pain much improved. Eager to attempt to mobilize with PT later today.  On my exam today, she does have range of motion from 0-100 degrees that is relatively painless, which is a dramatic improvement from prior exams. No significant tenderness to touch about the knee joint. No large joint effusion.  No warmth or erythema.   There is significant improvement in her symptoms and clinical exam after knee joint corticosteroid injection. We can continue to monitor for resolution of symptoms. Low likelihood of septic arthritis. Will follow peripherally. Please page with any questions.

## 2022-08-26 NOTE — Progress Notes (Signed)
PODIATRY PROGRESS NOTE Patient Name: Summer [name]  DOB 02-11-1951 DOA 08/10/2022  Hospital Day: 44  Assessment:  71 y.o. female  medical history significant for Diabetes with polyneuropathy with a chronic left foot wound previously seen at the wound care clinic, OSA, CKD stage IIIb, depression, HTN, HFpEF, class III obesity, OSA and hypothyroidism. Left lateral midfoot ulceration well healed at this time. Distal hallux ulcer to left foot dry and stable, no evidence of infection in left foot.     Plan:  - Left midfoot ulcer and left hallux ulcer improved with local wound care and offloading.  No evidence of infection left foot.  -Betadine paint to distal left hallux wound. Left midfoot wound is not healed and can be protected with mepilex border but does not need further betadine paint - WBAT in surgical shoe - Pt will follow up with Dr. Amalia Hailey 1 week from discharge for ongoing wound care Will continue to follow        Everitt Amber, [place]    Subjective:  Pt seen bedside, states she called to see if we could re evaluate her wounds. Denies new changes that she has seen with foot wounds, has been having wound care in the hospital. Having some left knee pain.   Objective:   Vitals:   08/25/22 2346 08/26/22 0531  BP: (!) 135/49 (!) 145/52  Pulse: 63 60  Resp: 16 16  Temp: 98.5 F (36.9 C) 98.8 F (37.1 C)  SpO2: 97% 98%       Latest Ref Rng & Units 08/26/2022    5:32 AM 08/25/2022    5:53 AM 08/24/2022    3:15 AM  CBC  WBC 4.0 - 10.5 K/uL 11.7  13.9  16.2   Hemoglobin 12.0 - 15.0 g/dL 8.9  9.0  9.1   Hematocrit 36.0 - 46.0 % 27.2  27.7  27.5   Platelets 150 - 400 K/uL 340  359  395        Latest Ref Rng & Units 08/26/2022    5:32 AM 08/25/2022    5:53 AM 08/24/2022    3:15 AM  BMP  Glucose 70 - 99 mg/dL 301  218  232   BUN 8 - 23 mg/dL 34  31  29   Creatinine 0.44 - 1.00 mg/dL 1.21  1.34  1.21   Sodium 135 - 145 mmol/L 135  138  138    Potassium 3.5 - 5.1 mmol/L 4.7  4.1  4.1   Chloride 98 - 111 mmol/L 105  106  108   CO2 22 - 32 mmol/L '26  27  25   '$ Calcium 8.9 - 10.3 mg/dL 8.6  8.8  8.7     General: AAOx3, NAD  Lower Extremity Exam Vasc:   L - PT palpable, DP palpable. Cap refill <3 sec to digits  Derm:  L - Wound at the lateral midfoot is dry with eschar overlying. Upon removal of the eschar, the prior wound is well healed with healthy pink epithelium underlying no evidence of infection. At the tip of the left hallux there is a dry eschar with no active drainage, no erythema or edema of the toe no sign of infeciton.         MSK:    L -  No gross deformities. Compartments soft, non-tender, compressible  Neuro:    L - Gross sensation diminished. Gross motor function intact   Radiology:  Results reviewed. See  assessment for pertinent imaging results

## 2022-08-27 DIAGNOSIS — M10062 Idiopathic gout, left knee: Secondary | ICD-10-CM | POA: Diagnosis not present

## 2022-08-27 DIAGNOSIS — B953 Streptococcus pneumoniae as the cause of diseases classified elsewhere: Secondary | ICD-10-CM | POA: Diagnosis not present

## 2022-08-27 DIAGNOSIS — R7881 Bacteremia: Secondary | ICD-10-CM | POA: Diagnosis not present

## 2022-08-27 LAB — CBC
HCT: 28.5 % — ABNORMAL LOW (ref 36.0–46.0)
Hemoglobin: 9.1 g/dL — ABNORMAL LOW (ref 12.0–15.0)
MCH: 27.7 pg (ref 26.0–34.0)
MCHC: 31.9 g/dL (ref 30.0–36.0)
MCV: 86.9 fL (ref 80.0–100.0)
Platelets: 332 10*3/uL (ref 150–400)
RBC: 3.28 MIL/uL — ABNORMAL LOW (ref 3.87–5.11)
RDW: 16.6 % — ABNORMAL HIGH (ref 11.5–15.5)
WBC: 9.5 10*3/uL (ref 4.0–10.5)
nRBC: 0 % (ref 0.0–0.2)

## 2022-08-27 LAB — GLUCOSE, CAPILLARY
Glucose-Capillary: 240 mg/dL — ABNORMAL HIGH (ref 70–99)
Glucose-Capillary: 254 mg/dL — ABNORMAL HIGH (ref 70–99)
Glucose-Capillary: 165 mg/dL — ABNORMAL HIGH (ref 70–99)
Glucose-Capillary: 280 mg/dL — ABNORMAL HIGH (ref 70–99)

## 2022-08-27 LAB — BASIC METABOLIC PANEL
Anion gap: 2 — ABNORMAL LOW (ref 5–15)
BUN: 28 mg/dL — ABNORMAL HIGH (ref 8–23)
CO2: 26 mmol/L (ref 22–32)
Calcium: 8.9 mg/dL (ref 8.9–10.3)
Chloride: 111 mmol/L (ref 98–111)
Creatinine, Ser: 1.13 mg/dL — ABNORMAL HIGH (ref 0.44–1.00)
GFR, Estimated: 51 mL/min — ABNORMAL LOW (ref >60)
Glucose, Bld: 204 mg/dL — ABNORMAL HIGH (ref 70–99)
Potassium: 5.2 mmol/L — ABNORMAL HIGH (ref 3.5–5.1)
Sodium: 139 mmol/L (ref 135–145)

## 2022-08-27 LAB — CK: Total CK: 32 U/L — ABNORMAL LOW (ref 38–234)

## 2022-08-27 LAB — MAGNESIUM: Magnesium: 1.8 mg/dL (ref 1.7–2.4)

## 2022-08-27 MED ORDER — INSULIN ASPART 100 UNIT/ML IJ SOLN
6.0000 [IU] | Freq: Three times a day (TID) | INTRAMUSCULAR | Status: DC
  Administered 2022-08-27 – 2022-08-28 (×4): 6 [IU] via SUBCUTANEOUS
  Filled 2022-08-27 (×4): qty 1

## 2022-08-27 MED ORDER — INSULIN ASPART 100 UNIT/ML IJ SOLN
5.0000 [IU] | Freq: Once | INTRAMUSCULAR | Status: AC
  Administered 2022-08-27: 5 [IU] via INTRAVENOUS
  Filled 2022-08-27: qty 1

## 2022-08-27 MED ORDER — INSULIN GLARGINE-YFGN 100 UNIT/ML ~~LOC~~ SOLN
35.0000 [IU] | Freq: Every day | SUBCUTANEOUS | Status: DC
  Administered 2022-08-27: 35 [IU] via SUBCUTANEOUS
  Filled 2022-08-27 (×2): qty 0.35

## 2022-08-27 NOTE — Progress Notes (Addendum)
Progress Note   Patient: Summer [name] TIR:[id number] DOB: 1951/04/13 DOA: 08/10/2022     17 DOS: the patient was seen and examined on 08/27/2022   Brief hospital course:  Summer [name] is a 71 y.o. female with a PMH of HTN, DM2 with neuropathy and charcot foot with chronic left foot wounds, Diastolic HF, and QPY1P who presented to the ED on 11/30 s/p fall with fevers, tachycardia, and left knee pain and joint effusion found to have Strep Pneumoniae bacteremia with an unclear source (left foot wound grew MSSA and left knee aspiration showed monosodium urate crystals, 93% PMNs, 38.1 WBCs with negative cultures).  TEE was negative for endocarditis.  Infectious Disease presumed bacteremia to be secondary to septic joint and recommended 4 weeks of antibiotics.  On 12/10, patient had a seizure and workup deemed that seizure was secondary to PRES in the setting of a gram positive bacteremia.  12/1: Ortho performed left joint aspiration. 12/6: TEE was negative for endocarditis. 12/8: Intra-articular steroid injection of the left knee 12/10: Patient had seizure and was loaded with Keppra.  MRI showed evidence of PRES. 12/11: IR unable to complete LP - first attempt. 12/12: IR unable to complete LP - second attempt.   12/13: Mental status improved.  ID and Neurology deemed CNS infection to be low based on clinical improvement - and determined that seizure were secondary to bacteremia.  Assessment and Plan: Streptococcus pneumoniae Bacteremia: The source was presumed to be secondary to septic knee joint despite negative cultures. - ID recommends completing 2 weeks of IV Ceftriaxone with stop date 12/17, followed by 2 weeks of PO Amoxicillin with stop date 12/31.  Appreciate assistance and recommendations. - Plan is to discharge to [facility] on 12/18 after completion of IV antibiotics.  We will discuss with CM on Monday.  Acute Gout of Left Knee: - S/P 12/8 intraarticular steroid injfection and 3 days of  steroids. - Continue Allopurinol.  Diabetic Foot Wound of the left foot: - 12/1 Podiatry evaluated patient and did not recommend any surgical intervention. - Continue antibiotics as above.  Seizure like activity on 12/10 secondary to PRES, first episode: MRI showed opacities in the parietal, frontal, and occipital lobes consistent with PRES.  Gram positive organisms are more commonly seen in infection associated PRES. - Continue to treat underlying infection. - Neurology does not recommend anti-seizure meds. - Continue home med Lyrica as this has been shown to have anti-seizure properties. - Hold Bupropion as this lowers the seizure threshold. - Repeat MRI of brain in 4 weeks to evaluate for resolution.  AKI on CKD2, resolved: - Creatinine is at baseline.  08/24/22  08/25/22  08/26/22  08/27/22   Creatinine 1.21 (H) 1.34 (H) 1.21 (H) 1.13 (H)   Generalized Weakness/ S/P Fall / Elevation in CK: - Symptoms improved after receiving fluids.  Physical Deconditioning: - PT/OT recommended SNF.  Chronic Diastolic Heart Failure: - Euvolemic.  Hypothyroidism: - Levothyroxine.  OSA/Morbid Obesity with BMI 44 kg/m2: - CPAP nightly.      Subjective: Summer [name] is doing well today.  Mental status is at baseline. She denies any fevers, chills, headaches, dizziness, chest pain, shortness of breath, joint pain, or back pain.  Physical Exam: Vitals:   08/27/22 0535 08/27/22 0738 08/27/22 1141 08/27/22 1141  BP: (!) 146/54 (!) 149/63 (!) 149/54 (!) 149/54  Pulse: (!) 56 (!) 58 62 (!) 59  Resp: '16 16 16 16  '$ Temp: 98 F (36.7 C) 98.7 F (37.1 C)  97.7 F (36.5 C) 97.7 F (36.5 C)  TempSrc:      SpO2: 97% 98% 98% 98%  Weight:      Height:       Examination: General exam: alert and oriented, mental status intact, no acute distress HEENT: NCAT, PERRL Respiratory system: CTAB no WRR Cardiovascular system: Did not appreciate a murmur, regular, No JVD. Gastrointestinal system: No flank  pain, Abdomen soft, NT,ND, BS+. Nervous System: No focal deficits. Extremities: Charcot deformity of left foot, distal peripheral pulses palpable.  Skin: No rashes, No bruises, No icterus. MSK: deconditioning  Data Reviewed:  Last metabolic panel Lab Results  Component Value Date   GLUCOSE 204 (H) 08/27/2022   NA 139 08/27/2022   K 5.2 (H) 08/27/2022   CL 111 08/27/2022   CO2 26 08/27/2022   BUN 28 (H) 08/27/2022   CREATININE 1.13 (H) 08/27/2022   GFRNONAA 51 (L) 08/27/2022   CALCIUM 8.9 08/27/2022   PROT 6.7 10/03/2021   ALBUMIN 4.4 06/06/2022   LABGLOB 2.5 09/24/2020   AGRATIO 1.8 09/24/2020   BILITOT 0.4 10/03/2021   ALKPHOS 59 06/06/2022   AST 17 06/06/2022   ALT 13 06/06/2022   ANIONGAP 2 (L) 08/27/2022     Family Communication: None  Disposition: Status is: Inpatient Remains inpatient appropriate because: Ready for discharge on 2 weeks augmentin on 12/18.  Planned Discharge Destination: [facility]   DVT prophylaxis-Lovenox Time spent: 35 minutes  Author: George Hugh, MD 08/27/2022 1:34 PM  For on call review [email or web address].

## 2022-08-27 NOTE — Progress Notes (Signed)
Physical Therapy Treatment Patient Details Name: Summer [name] MRN: [id number] DOB: 1951/06/18 Today's Date: 08/27/2022   History of Present Illness 71 y.o. female with medical history significant for Diabetes with polyneuropathy with a chronic left foot wound previously seen at the wound care clinic, OSA, CKD stage IIIb, depression, HTN, HFpEF, class III obesity, OSA and hypothyroidism,Who presents to the ED via EMS with frequent falls over the past 3 days, now with severe left knee pain.  L knee tap reveals gout.   Hospital course additionally significant for seizure activity with subsequent code blue (did not lose pulse) on 12/10 due to respiratory concerns (aborted due to DNR/DNI status per notes).    PT Comments    Pt ready for session.  Motivated to participate stating she does not want to go to rehab.  Gets to EOB with rail and mod a x 1.  Pt noted to be saturated in urine.  She stated she is unaware.  Tech called to assist with care.  Stood EOB x 2 with mod a x 2.  She is able to stand x 2 and on second attempt she is able to take 2 very small sidesteps along bed but poor quality steps and high risk for falls.  Pt stated she does not want to go to rehab and wants to go home with her boyfriend [name] who she does not want to be a burden to and admits can not handle the care.  She does seem somewhat unrealistic in her expectations stating she could walk when she came in so she felt she would be able to do so again at home.  Discussed current level of care and realistic picture of home if she decides to decline rehab.  If she does not agree she will need all available equipment including hospital bed, lift, commode, wheelchair and walker along with HHPT/OT and available aide services.   Recommendations for follow up therapy are one component of a multi-disciplinary discharge planning process, led by the attending physician.  Recommendations may be updated based on patient status, additional  functional criteria and insurance authorization.  Follow Up Recommendations  Skilled nursing-short term rehab (<3 hours/day)     Assistance Recommended at Discharge Frequent or constant Supervision/Assistance  Patient can return home with the following Two people to help with walking and/or transfers;Two people to help with bathing/dressing/bathroom;Assistance with cooking/housework;Help with stairs or ramp for entrance;Assist for transportation   Equipment Recommendations  None recommended by PT    Recommendations for Other Services       Precautions / Restrictions Precautions Precautions: Fall Restrictions Weight Bearing Restrictions: Yes LLE Weight Bearing: Weight bearing as tolerated Other Position/Activity Restrictions: L post op shoe     Mobility  Bed Mobility Overal bed mobility: Needs Assistance Bed Mobility: Supine to Sit, Sit to Supine Rolling: Min assist, Mod assist     Sit to supine: Min assist   General bed mobility comments: heavy use of rails and HOB elevated    Transfers Overall transfer level: Needs assistance Equipment used: Rolling walker (2 wheels) Transfers: Sit to/from Stand Sit to Stand: Min assist, Mod assist, +2 physical assistance           General transfer comment: stood x 2 at EOB    Ambulation/Gait Ambulation/Gait assistance: Mod assist, +2 physical assistance Gait Distance (Feet): 2 Feet Assistive device: Rolling walker (2 wheels) Gait Pattern/deviations: Step-to pattern Gait velocity: decreased     General Gait Details: several small sidesteps along bed with +  2 assist,  unsafe to attempt to chair   Stairs             Wheelchair Mobility    Modified Rankin (Stroke Patients Only)       Balance Overall balance assessment: Needs assistance Sitting-balance support: Bilateral upper extremity supported Sitting balance-Leahy Scale: Fair     Standing balance support: Bilateral upper extremity supported Standing  balance-Leahy Scale: Poor Standing balance comment: relaint on walker and +2 staff                            Cognition Arousal/Alertness: Awake/alert Behavior During Therapy: WFL for tasks assessed/performed Overall Cognitive Status: Within Functional Limits for tasks assessed                                          Exercises      General Comments        Pertinent Vitals/Pain Pain Assessment Pain Assessment: Faces Faces Pain Scale: Hurts little more Pain Location: head Pain Descriptors / Indicators: Grimacing, Aching Pain Intervention(s): Limited activity within patient's tolerance, Monitored during session, Repositioned    Home Living                          Prior Function            PT Goals (current goals can now be found in the care plan section) Progress towards PT goals: Progressing toward goals    Frequency    Min 2X/week      PT Plan Current plan remains appropriate    Co-evaluation              AM-PAC PT "6 Clicks" Mobility   Outcome Measure  Help needed turning from your back to your side while in a flat bed without using bedrails?: A Little Help needed moving from lying on your back to sitting on the side of a flat bed without using bedrails?: A Lot Help needed moving to and from a bed to a chair (including a wheelchair)?: A Lot Help needed standing up from a chair using your arms (e.g., wheelchair or bedside chair)?: A Lot Help needed to walk in hospital room?: A Lot Help needed climbing 3-5 steps with a railing? : Total 6 Click Score: 12    End of Session Equipment Utilized During Treatment: Gait belt Activity Tolerance: Patient tolerated treatment well;Patient limited by pain Patient left: in bed;with call bell/phone within reach;with bed alarm set Nurse Communication: Mobility status PT Visit Diagnosis: Muscle weakness (generalized) (M62.81);Difficulty in walking, not elsewhere classified  (R26.2);Pain Pain - Right/Left: Left Pain - part of body: Knee     Time: 2330-0762 PT Time Calculation (min) (ACUTE ONLY): 24 min  Charges:  $Therapeutic Activity: 23-37 mins                   Chesley Noon, PTA 08/27/22, 11:01 AM

## 2022-08-28 ENCOUNTER — Telehealth: Payer: Self-pay | Admitting: Podiatry

## 2022-08-28 DIAGNOSIS — E559 Vitamin D deficiency, unspecified: Secondary | ICD-10-CM | POA: Diagnosis not present

## 2022-08-28 DIAGNOSIS — I6783 Posterior reversible encephalopathy syndrome: Secondary | ICD-10-CM | POA: Diagnosis not present

## 2022-08-28 DIAGNOSIS — N1832 Chronic kidney disease, stage 3b: Secondary | ICD-10-CM | POA: Diagnosis not present

## 2022-08-28 DIAGNOSIS — E1140 Type 2 diabetes mellitus with diabetic neuropathy, unspecified: Secondary | ICD-10-CM | POA: Diagnosis not present

## 2022-08-28 DIAGNOSIS — B953 Streptococcus pneumoniae as the cause of diseases classified elsewhere: Secondary | ICD-10-CM | POA: Diagnosis not present

## 2022-08-28 DIAGNOSIS — M6250 Muscle wasting and atrophy, not elsewhere classified, unspecified site: Secondary | ICD-10-CM | POA: Diagnosis not present

## 2022-08-28 DIAGNOSIS — D519 Vitamin B12 deficiency anemia, unspecified: Secondary | ICD-10-CM | POA: Diagnosis not present

## 2022-08-28 DIAGNOSIS — I5032 Chronic diastolic (congestive) heart failure: Secondary | ICD-10-CM

## 2022-08-28 DIAGNOSIS — E11621 Type 2 diabetes mellitus with foot ulcer: Secondary | ICD-10-CM | POA: Diagnosis not present

## 2022-08-28 DIAGNOSIS — Z7401 Bed confinement status: Secondary | ICD-10-CM | POA: Diagnosis not present

## 2022-08-28 DIAGNOSIS — E039 Hypothyroidism, unspecified: Secondary | ICD-10-CM

## 2022-08-28 DIAGNOSIS — R1311 Dysphagia, oral phase: Secondary | ICD-10-CM | POA: Diagnosis not present

## 2022-08-28 DIAGNOSIS — R7881 Bacteremia: Secondary | ICD-10-CM | POA: Diagnosis not present

## 2022-08-28 DIAGNOSIS — M6281 Muscle weakness (generalized): Secondary | ICD-10-CM | POA: Diagnosis not present

## 2022-08-28 DIAGNOSIS — M25562 Pain in left knee: Secondary | ICD-10-CM | POA: Diagnosis not present

## 2022-08-28 DIAGNOSIS — R2681 Unsteadiness on feet: Secondary | ICD-10-CM | POA: Diagnosis not present

## 2022-08-28 DIAGNOSIS — N179 Acute kidney failure, unspecified: Secondary | ICD-10-CM | POA: Diagnosis not present

## 2022-08-28 DIAGNOSIS — F331 Major depressive disorder, recurrent, moderate: Secondary | ICD-10-CM

## 2022-08-28 DIAGNOSIS — M00162 Pneumococcal arthritis, left knee: Secondary | ICD-10-CM | POA: Diagnosis not present

## 2022-08-28 DIAGNOSIS — M109 Gout, unspecified: Secondary | ICD-10-CM | POA: Diagnosis not present

## 2022-08-28 DIAGNOSIS — M00862 Arthritis due to other bacteria, left knee: Secondary | ICD-10-CM | POA: Diagnosis not present

## 2022-08-28 DIAGNOSIS — R569 Unspecified convulsions: Secondary | ICD-10-CM | POA: Diagnosis not present

## 2022-08-28 DIAGNOSIS — G9341 Metabolic encephalopathy: Secondary | ICD-10-CM

## 2022-08-28 DIAGNOSIS — I959 Hypotension, unspecified: Secondary | ICD-10-CM | POA: Diagnosis not present

## 2022-08-28 DIAGNOSIS — I110 Hypertensive heart disease with heart failure: Secondary | ICD-10-CM | POA: Diagnosis not present

## 2022-08-28 DIAGNOSIS — R278 Other lack of coordination: Secondary | ICD-10-CM | POA: Diagnosis not present

## 2022-08-28 DIAGNOSIS — J45909 Unspecified asthma, uncomplicated: Secondary | ICD-10-CM | POA: Diagnosis not present

## 2022-08-28 DIAGNOSIS — W19XXXA Unspecified fall, initial encounter: Secondary | ICD-10-CM | POA: Diagnosis not present

## 2022-08-28 DIAGNOSIS — E1165 Type 2 diabetes mellitus with hyperglycemia: Secondary | ICD-10-CM

## 2022-08-28 DIAGNOSIS — A4901 Methicillin susceptible Staphylococcus aureus infection, unspecified site: Secondary | ICD-10-CM | POA: Diagnosis not present

## 2022-08-28 DIAGNOSIS — E785 Hyperlipidemia, unspecified: Secondary | ICD-10-CM | POA: Diagnosis not present

## 2022-08-28 DIAGNOSIS — Z6841 Body Mass Index (BMI) 40.0 and over, adult: Secondary | ICD-10-CM

## 2022-08-28 DIAGNOSIS — E872 Acidosis, unspecified: Secondary | ICD-10-CM

## 2022-08-28 DIAGNOSIS — I130 Hypertensive heart and chronic kidney disease with heart failure and stage 1 through stage 4 chronic kidney disease, or unspecified chronic kidney disease: Secondary | ICD-10-CM | POA: Diagnosis not present

## 2022-08-28 DIAGNOSIS — M14672 Charcot's joint, left ankle and foot: Secondary | ICD-10-CM | POA: Diagnosis not present

## 2022-08-28 DIAGNOSIS — I5030 Unspecified diastolic (congestive) heart failure: Secondary | ICD-10-CM | POA: Diagnosis not present

## 2022-08-28 DIAGNOSIS — A403 Sepsis due to Streptococcus pneumoniae: Secondary | ICD-10-CM | POA: Diagnosis not present

## 2022-08-28 DIAGNOSIS — Z794 Long term (current) use of insulin: Secondary | ICD-10-CM | POA: Diagnosis not present

## 2022-08-28 LAB — BASIC METABOLIC PANEL
Anion gap: 6 (ref 5–15)
BUN: 32 mg/dL — ABNORMAL HIGH (ref 8–23)
CO2: 26 mmol/L (ref 22–32)
Calcium: 9.2 mg/dL (ref 8.9–10.3)
Chloride: 106 mmol/L (ref 98–111)
Creatinine, Ser: 1.23 mg/dL — ABNORMAL HIGH (ref 0.44–1.00)
GFR, Estimated: 46 mL/min — ABNORMAL LOW (ref >60)
Glucose, Bld: 216 mg/dL — ABNORMAL HIGH (ref 70–99)
Potassium: 5.1 mmol/L (ref 3.5–5.1)
Sodium: 138 mmol/L (ref 135–145)

## 2022-08-28 LAB — CBC
HCT: 27.3 % — ABNORMAL LOW (ref 36.0–46.0)
Hemoglobin: 8.9 g/dL — ABNORMAL LOW (ref 12.0–15.0)
MCH: 28.1 pg (ref 26.0–34.0)
MCHC: 32.6 g/dL (ref 30.0–36.0)
MCV: 86.1 fL (ref 80.0–100.0)
Platelets: 306 10*3/uL (ref 150–400)
RBC: 3.17 MIL/uL — ABNORMAL LOW (ref 3.87–5.11)
RDW: 16.5 % — ABNORMAL HIGH (ref 11.5–15.5)
WBC: 8.8 10*3/uL (ref 4.0–10.5)
nRBC: 0 % (ref 0.0–0.2)

## 2022-08-28 LAB — MAGNESIUM: Magnesium: 1.8 mg/dL (ref 1.7–2.4)

## 2022-08-28 LAB — GLUCOSE, CAPILLARY
Glucose-Capillary: 217 mg/dL — ABNORMAL HIGH (ref 70–99)
Glucose-Capillary: 394 mg/dL — ABNORMAL HIGH (ref 70–99)
Glucose-Capillary: 415 mg/dL — ABNORMAL HIGH (ref 70–99)

## 2022-08-28 LAB — CK: Total CK: 31 U/L — ABNORMAL LOW (ref 38–234)

## 2022-08-28 MED ORDER — AMOXICILLIN 500 MG PO CAPS: 1000.0000 mg | ORAL_CAPSULE | Freq: Three times a day (TID) | ORAL | 0 refills | Status: AC

## 2022-08-28 MED ORDER — AMLODIPINE BESYLATE 10 MG PO TABS: 10.0000 mg | ORAL_TABLET | Freq: Every day | ORAL | Status: DC

## 2022-08-28 MED ORDER — ALLOPURINOL 100 MG PO TABS: 50.0000 mg | ORAL_TABLET | Freq: Every day | ORAL | Status: DC

## 2022-08-28 MED ORDER — MODAFINIL 100 MG PO TABS: 100.0000 mg | ORAL_TABLET | Freq: Every day | ORAL | 0 refills | Status: DC

## 2022-08-28 MED ORDER — ADULT MULTIVITAMIN W/MINERALS CH: 1.0000 | ORAL_TABLET | Freq: Every day | ORAL | Status: DC

## 2022-08-28 MED ORDER — ENSURE ENLIVE PO LIQD: 237.0000 mL | Freq: Three times a day (TID) | ORAL | 12 refills | Status: DC

## 2022-08-28 MED ORDER — PREGABALIN 150 MG PO CAPS: 150.0000 mg | ORAL_CAPSULE | Freq: Two times a day (BID) | ORAL | 0 refills | Status: DC

## 2022-08-28 MED ORDER — METOPROLOL TARTRATE 50 MG PO TABS: 50.0000 mg | ORAL_TABLET | Freq: Two times a day (BID) | ORAL | Status: DC

## 2022-08-28 MED ORDER — INSULIN NPH ISOPHANE & REGULAR (70-30) 100 UNIT/ML ~~LOC~~ SUSP: SUBCUTANEOUS | 11 refills | Status: DC

## 2022-08-28 NOTE — Progress Notes (Signed)
Called and gave report to Loletha Grayer at the [facility], patient going to room 136-B

## 2022-08-28 NOTE — Discharge Summary (Addendum)
Physician Discharge Summary  AVERIANNA BRUGGER MGQ:[id number] DOB: 1950-10-12 DOA: 08/10/2022  PCP: Steele Sizer, MD  Admit date: 08/10/2022 Discharge date: 08/28/2022 Admitted From: Home Disposition: SNF Recommendations for Outpatient Follow-up:  Check CMP and CBC in 1 to 2 weeks Monitor blood glucose and adjust insulin as appropriate. Repeat MRI in 4 weeks to ensure resolution of opacities in the parietal, frontal, and occipital lobes Please follow up on the following pending results: None   Discharge Condition: Stable CODE STATUS: DNR/DNI  Contact information for after-discharge care     Destination     [facility] .   Service: Skilled Nursing Contact information: [facility] 71 y.o. female with a PMH of HTN, DM2 with neuropathy and charcot foot with chronic left foot wounds, OSA not compliant with CPAP, Diastolic HF, and OIZ1I who presented to the ED on 11/30 s/p fall with fevers, tachycardia, and left knee pain and joint effusion found to have Strep Pneumoniae bacteremia with an unclear source (left foot wound grew MSSA and left knee aspiration showed monosodium urate crystals, 93% PMNs, 38.1 WBCs with negative cultures).  TEE was negative for endocarditis.  Infectious Disease presumed bacteremia to be secondary to septic joint and recommended 4 weeks of antibiotics.  On 12/10, patient had a seizure and workup deemed that seizure was secondary to PRES in the setting of a gram positive bacteremia.   12/1: Ortho performed left joint aspiration. 12/6: TEE was negative for endocarditis. 12/8: Intra-articular steroid injection of the left knee 12/10: Patient had seizure and was loaded with Keppra.  MRI showed evidence of PRES. 12/11: IR unable to complete LP - first attempt. 12/12: IR unable to complete LP - second attempt.   12/13: Mental status improved.  ID and  Neurology deemed CNS infection to be low based on clinical improvement - and determined that seizure were secondary to bacteremia.  On the day of discharge, patient remained stable.  She is awake, alert oriented x 4.  She is discharged on p.o. amoxicillin through 09/10/2022 for bacteremia per recommendation by ID.  See individual problem list below for more.   Problems addressed during this hospitalization Principal Problem:   Bacteremia due to Streptococcus pneumoniae Active Problems:   Sepsis ([place])   Traumatic rhabdomyolysis ([place])   Acute pain of left knee   Ulcer of left foot due to type 2 diabetes mellitus ([place])   Uncontrolled type 2 diabetes mellitus with hyperglycemia, with long-term current use of insulin ([name])   Acute renal failure superimposed on stage 3b chronic kidney disease ([place])   Obstructive sleep apnea   Adult hypothyroidism   Morbid obesity with BMI of 40.0-44.9, adult ([name])   (HFpEF) heart failure with preserved ejection fraction ([name])   Major depressive disorder, recurrent episode, moderate ([name])   Metabolic acidosis   Multiple falls   Gouty arthritis   Pyogenic arthritis of left knee joint ([name])   Diabetic ulcer of left midfoot associated with diabetes mellitus due to underlying condition, limited to breakdown of skin ([place])   Seizure ([place])   Posterior reversible encephalopathy syndrome   Acute metabolic encephalopathy   Streptococcus pneumoniae Bacteremia:  presumed to be secondary to septic knee joint despite negative cultures. - ID recs: 2 weeks of IV Ceftriaxone with stop date 12/17, followed by 2 weeks of PO Amoxicillin  with stop date 12/31.    Acute Gout of Left Knee: S/P 12/8 intraarticular steroid injfection and 3 days of steroids. - Continue Allopurinol   Diabetic Foot Wound of the left foot: - 12/1 Podiatry evaluated patient and did not recommend any surgical intervention. - Continue antibiotics as above.   Seizure like activity on 12/10 secondary  to PRES, first episode: MRI showed opacities in the parietal, frontal, and occipital lobes consistent with PRES.  Gram positive organisms are more commonly seen in infection associated PRES. - Continue to treat underlying infection. -Neurology does not recommend anti-seizure meds. -Continue Lyrica. -Discontinued Wellbutrin due to risk of seizure. -Repeat MRI of brain in 4 weeks to evaluate for resolution.   AKI on CKDA, resolved: Recent Labs    08/19/22 0356 08/20/22 0558 08/21/22 0502 08/22/22 0351 08/23/22 0447 08/24/22 0315 08/25/22 0553 08/26/22 0532 08/27/22 0520 08/28/22 0455  BUN 30* 31* 30* 29* 25* 29* 31* 34* 28* 32*  CREATININE 1.41* 1.30* 1.17* 1.24* 1.14* 1.21* 1.34* 1.21* 1.13* 1.23*  -Discontinued Avalide -Recheck renal function in 1 to 2 weeks.    Uncontrolled IDDM-2 with neuropathy, hyperglycemia and CKD-3: A1c 7.5% in 05/2022. -Continue home insulin 70/30 at 45 units twice daily -Continue low-dose metformin. -Continue statin.  Essential hypertension: BP within acceptable range. -On amlodipine 10 mg daily and metoprolol 50 mg twice daily -Avalide discontinued this hospitalization. -Reassess blood pressure and renal function and adjust antihypertensive meds as appropriate  Generalized Weakness/ S/P Fall / Elevation in CK: - Symptoms improved after receiving fluids.   Physical Deconditioning: - PT/OT recommended SNF.   Chronic Diastolic Heart Failure: Appears euvolemic. -Continue p.o. Lasix 20 mg daily   Hypothyroidism: -Continue levothyroxine.   OSA/Morbid Obesity with BMI 44 kg/m2: not compliant with CPAP -Encourage weight loss    Nutrition Problem: Inadequate oral intake Etiology: poor appetite Signs/Symptoms: meal completion < 25% Interventions: MVI, Liberalize Diet, Prostat, Magic cup     Vital signs Vitals:   08/28/22 0018 08/28/22 0520 08/28/22 0754 08/28/22 0900  BP: (!) 154/64 (!) 146/62 (!) 137/56 (!) 139/57  Pulse: 66 61 64 68   Temp: 97.6 F (36.4 C) 97.9 F (36.6 C) 97.7 F (36.5 C)   Resp: _0 Height:      Weight:      SpO2: 98% 100% 99%   TempSrc: Oral Oral    BMI (Calculated):         Discharge exam  GENERAL: No apparent distress.  Nontoxic. HEENT: MMM.  Vision and hearing grossly intact.  NECK: Supple.  No apparent JVD.  RESP:  No IWOB.  Fair aeration bilaterally. CVS:  RRR. Heart sounds normal.  ABD/GI/GU: BS+. Abd soft, NTND.  MSK/EXT:  Moves extremities. No edema.  SKIN: no apparent skin lesion or wound NEURO: Awake and alert. Oriented appropriately.  No apparent focal neuro deficit. PSYCH: Calm. Normal affect.   Discharge Instructions Discharge Instructions     Diet - low sodium heart healthy   Complete by: As directed    Diet Carb Modified   Complete by: As directed    Discharge wound care:   Complete by: As directed    Monitor   Increase activity slowly   Complete by: As directed       Allergies as of 08/28/2022       Reactions   Codeine Other (See Comments)   "TRIPPED OUT"  DIDN'T LIKE THE WAY IT FELT   Atorvastatin    muscle pain  Hydrocodone    itching   Tramadol    Latex Rash   Zolpidem Other (See Comments)   Sleep walk        Medication List     STOP taking these medications    buPROPion 150 MG 24 hr tablet Commonly known as: WELLBUTRIN XL   fluconazole 150 MG tablet Commonly known as: DIFLUCAN   irbesartan-hydrochlorothiazide 300-12.5 MG tablet Commonly known as: AVALIDE   ketoconazole 2 % cream Commonly known as: NIZORAL       TAKE these medications    Accu-Chek Aviva Plus w/Device Kit   Accu-Chek Softclix Lancets lancets   acetaminophen 650 MG CR tablet Commonly known as: Tylenol 8 Hour Take 1 tablet (650 mg total) by mouth every 8 (eight) hours as needed for pain.   albuterol 108 (90 Base) MCG/ACT inhaler Commonly known as: VENTOLIN HFA Inhale 2 puffs into the lungs every 6 (six) hours as needed for wheezing or shortness  of breath.   allopurinol 100 MG tablet Commonly known as: ZYLOPRIM Take 0.5 tablets (50 mg total) by mouth daily.   amLODipine 10 MG tablet Commonly known as: NORVASC Take 1 tablet (10 mg total) by mouth daily. What changed:  medication strength how much to take   amoxicillin 500 MG capsule Commonly known as: AMOXIL Take 2 capsules (1,000 mg total) by mouth every 8 (eight) hours for 13 days.   ASPIRIN LOW DOSE 81 MG tablet Generic drug: aspirin EC TAKE 1 TABLET (81 MG TOTAL) BY MOUTH DAILY.   B-D SINGLE USE SWABS REGULAR Pads   Breztri Aerosphere 160-9-4.8 MCG/ACT Aero Generic drug: Budeson-Glycopyrrol-Formoterol Inhale 2 puffs into the lungs in the morning and at bedtime.   ezetimibe 10 MG tablet Commonly known as: ZETIA Take 1 tablet (10 mg total) by mouth daily.   feeding supplement Liqd Take 237 mLs by mouth 3 (three) times daily.   FreeStyle Libre 2 Sensor Misc FOR GLUCOSE MONITORING   furosemide 20 MG tablet Commonly known as: LASIX Take 1 tablet (20 mg total) by mouth daily.   insulin NPH-regular Human (70-30) 100 UNIT/ML injection Inject 45 units after breakfast and dinner if eating > 50% of meals What changed:  how much to take how to take this when to take this additional instructions   levothyroxine 25 MCG tablet Commonly known as: SYNTHROID Take 1 tablet (25 mcg total) by mouth daily before breakfast. Two on Sunday   metFORMIN 500 MG tablet Commonly known as: GLUCOPHAGE Take 2 tablets by mouth daily with breakfast. What changed:  how much to take when to take this   metoprolol tartrate 50 MG tablet Commonly known as: LOPRESSOR Take 1 tablet (50 mg total) by mouth 2 (two) times daily. What changed:  medication strength how much to take   modafinil 100 MG tablet Commonly known as: PROVIGIL Take 1 tablet (100 mg total) by mouth daily for 10 days.   montelukast 10 MG tablet Commonly known as: SINGULAIR Take 10 mg by mouth daily.    multivitamin with minerals Tabs tablet Take 1 tablet by mouth daily.   omeprazole 40 MG capsule Commonly known as: PRILOSEC Take 1 capsule (40 mg total) by mouth daily.   OSTEO BI-FLEX ADV JOINT SHIELD PO Take by mouth 2 (two) times daily.   Potassium 99 MG Tabs Take by mouth daily at 6 (six) AM.   pregabalin 150 MG capsule Commonly known as: LYRICA Take 1 capsule (150 mg total) by mouth 2 (two) times daily for  10 days. What changed:  medication strength how much to take when to take this   PROBIOTIC-10 PO Take by mouth.   RELION INSULIN SYRINGE 1ML/31G 31G X 5/16" 1 ML Misc Generic drug: Insulin Syringe-Needle U-100   rosuvastatin 40 MG tablet Commonly known as: CRESTOR Take 1 tablet (40 mg total) by mouth daily.   sertraline 100 MG tablet Commonly known as: ZOLOFT Take 1.5 tablets (150 mg total) by mouth daily.   traZODone 100 MG tablet Commonly known as: DESYREL Take 1 tablet (100 mg total) by mouth at bedtime as needed for sleep.   Vascepa 1 g capsule Generic drug: icosapent Ethyl Take 2 capsules (2 g total) by mouth 2 (two) times daily.   VITAMIN B12 PO Take 1 tablet by mouth daily.   Vitamin D3 25 MCG (1000 UT) Caps Take 1,000 Units by mouth.               Discharge Care Instructions  (From admission, onward)           Start     Ordered   08/28/22 0000  Discharge wound care:       Comments: Monitor   08/28/22 1238            Consultations: Infectious disease Orthopedic surgery Podiatry Neurology  Procedures/Studies:   DG FL GUIDED LUMBAR PUNCTURE  Result Date: 08/22/2022 CLINICAL DATA:  Provided history: Acute encephalopathy. EXAM: DIAGNOSTIC LUMBAR PUNCTURE UNDER FLUOROSCOPIC GUIDANCE COMPARISON:  Radiographs of the lumbar spine from the lumbar puncture attempted yesterday 08/21/2022. Lumbar spine MRI 11/09/2019. FLUOROSCOPY: Fluoroscopy time: 1 minute, 18 seconds (55.50 mGy). PROCEDURE: Due to the patient's altered  mental status, Rushie Nyhan, NP obtained informed consent from the patient's POA (Darrel Lambe) prior to the procedure. This process included a discussion of procedural risks. The patient was position prone on the fluoroscopy table. An appropriate skin entry site was determined under fluoroscopy and marked. The operator donned sterile gloves and a mask. A time-out was performed. The skin entry site was prepped with Betadine and draped in the usual sterile fashion. 1% lidocaine was used for local anesthesia. Under fluoroscopic guidance, a 20 gauge 3.5 inch spinal needle was advanced into the thecal sac at the L4-L5 level. There was transient return of CSF into the needle hub. However, no CSF could be collected despite tilting the table, and despite needle repositioning. Subsequently, lumbar puncture was attempted at the L5-S1 level. However, the patient became agitated during this lumbar puncture attempt. At this point, it was felt unsafe to continue and the procedure was terminated. The procedure was performed by Rushie Nyhan, NP, and was supervised by Dr. Kellie Simmering. IMPRESSION: Fluoroscopically-guided lumbar puncture at the L4-L5 level, as described. Although there was transient CSF return into the needle hub, no CSF could be collected. In reviewing the patient's prior lumbar spine MRI of 11/09/2019, this poor CSF return is likely related to prominent epidural lipomatosis resulting in diffuse narrowing of the lumbar thecal sac. Fluoroscopically-guided lumbar puncture was also attempted at the L5-S1 level. However, the patient became agitated during this lumbar puncture attempt. At this point, it was felt unsafe to continue and the procedure was terminated. Electronically Signed   By: Kellie Simmering D.O.   On: 08/22/2022 14:25   DG FL GUIDED LUMBAR PUNCTURE  Result Date: 08/21/2022 PROCEDURE: This procedure was unable to be performed due to patient condition. Initial scouting images of lumbar spine  were performed, but Reatha Armour, PA-C reports the patient was unable  to hold still for procedure, and it was aborted. Case was discussed with the ordering team on the floor. Electronically Signed   By: Yvonne Kendall M.D.   On: 08/21/2022 17:49   EEG adult  Result Date: 08/21/2022 Derek Jack, MD     08/21/2022  3:39 PM Routine EEG Report ILANA PREZIOSO is a 71 y.o. female with a history of seizure who is undergoing an EEG to evaluate for seizures. Report: This EEG was acquired with electrodes placed according to the International 10-20 electrode system (including Fp1, Fp2, F3, F4, C3, C4, P3, P4, O1, O2, T3, T4, T5, T6, A1, A2, Fz, Cz, Pz). The following electrodes were missing or displaced: none. The occipital dominant rhythm was 6 Hz with intermittent diffuse slowing and continuous right focal slowing. This activity is reactive to stimulation. Drowsiness was manifested by background fragmentation; deeper stages of sleep were identified by K complexes and sleep spindles. There were no interictal epileptiform discharges. There were no electrographic seizures identified. Photic stimulation and hyperventilation were not performed. Impression and clinical correlation: This EEG was obtained while awake and asleep and is abnormal due to mild-to-moderate diffuse slowing indicative of global cerebral dysfunction as well as focal slowing over the right hemisphere indicating focal cerebral dysfunction in that region. Epileptiform abnormalities were not seen during this recording. Su Monks, MD [facility] [phone] If 7pm- 7am, please page neurology on call as listed in [place].   MR BRAIN WO CONTRAST  Result Date: 08/20/2022 CLINICAL DATA:  Initial evaluation for new onset seizure, altered mental status. EXAM: MRI HEAD WITHOUT CONTRAST TECHNIQUE: Multiplanar, multiecho pulse sequences of the brain and surrounding structures were obtained without intravenous contrast. COMPARISON:  Comparison  made with prior CTs from earlier the same day. FINDINGS: Brain: Examination degraded by motion artifact. Cerebral volume within normal limits for age. Scattered patchy T2/FLAIR signal abnormality involving the cortical to subcortical aspects of both frontal, parietal, and occipital lobes, most pronounced posteriorly, and most characteristic of PRES. No significant mass effect. No associated hemorrhage or other complicating features. No evidence for acute or subacute vascular infarct. Gray-white matter differentiation otherwise maintained. No areas of chronic cortical infarction. No acute or chronic intracranial blood products. No mass lesion, midline shift or mass effect. No hydrocephalus or extra-axial fluid collection. Pituitary gland and suprasellar region within normal limits. No other intrinsic temporal lobe abnormality. Vascular: Major intracranial vascular flow voids are grossly maintained at the skull base. Skull and upper cervical spine: Craniocervical junction within normal limits. Bone marrow signal intensity normal. No scalp soft tissue abnormality. Sinuses/Orbits: Patient status post bilateral ocular lens replacement. Paranasal sinuses are clear. No mastoid effusion. Other: None. IMPRESSION: 1. Scattered patchy T2/FLAIR signal abnormality involving the cortical to subcortical aspects of both frontal, parietal, and occipital lobes, most characteristic of PRES. No associated hemorrhage or other complicating features. 2. Otherwise negative brain MRI for age. Electronically Signed   By: Jeannine Boga M.D.   On: 08/20/2022 21:45   CT ANGIO HEAD NECK W WO CM W PERF (CODE STROKE)  Result Date: 08/20/2022 CLINICAL DATA:  Neuro deficit with acute stroke suspected EXAM: CT ANGIOGRAPHY HEAD AND NECK CT PERFUSION BRAIN TECHNIQUE: Multidetector CT imaging of the head and neck was performed using the standard protocol during bolus administration of intravenous contrast. Multiplanar CT image  reconstructions and MIPs were obtained to evaluate the vascular anatomy. Carotid stenosis measurements (when applicable) are obtained utilizing NASCET criteria, using the distal internal carotid diameter as the denominator. Multiphase  CT imaging of the brain was performed following IV bolus contrast injection. Subsequent parametric perfusion maps were calculated using RAPID software. RADIATION DOSE REDUCTION: This exam was performed according to the departmental dose-optimization program which includes automated exposure control, adjustment of the mA and/or kV according to patient size and/or use of iterative reconstruction technique. CONTRAST:  148m OMNIPAQUE IOHEXOL 350 MG/ML SOLN COMPARISON:  Head CT from earlier today. FINDINGS: CTA NECK FINDINGS Aortic arch: Atheromatous calcification with 3 vessel branching Right carotid system: Calcified plaque at the bifurcation with mild stenosis. No ulceration, dissection, or beading. Left carotid system: Calcified plaque at the bifurcation. No stenosis, dissection, or beading. Mid ICA kink. Vertebral arteries: Proximal subclavian atherosclerosis. Codominant vertebral arteries that are smoothly contoured and widely patent to the dura. Skeleton: Severe cervical spine degeneration. Sclerosis of lower cervical vertebrae appears degenerative. No acute finding Other neck: No acute finding in the soft tissues. Upper chest: Clear apical lungs Review of the MIP images confirms the above findings CTA HEAD FINDINGS Anterior circulation: No branch occlusion, beading, or aneurysm. Atheromatous calcification of the cavernous carotids with calcification limiting luminal visualization especially at the left anterior genu. Posterior circulation: Atheromatous calcification at the V4 segments with moderate stenosis on the right. Atheromatous irregularity of the posterior cerebral arteries with moderate right P2 segment narrowing. No branch occlusion, beading, or aneurysm. Venous sinuses:  Unremarkable Anatomic variants: Unremarkable Review of the MIP images confirms the above findings CT Brain Perfusion Findings: ASPECTS: 0 CBF (<30%) Volume: 048mIMPRESSION: No emergent finding by CTA or CT perfusion. Atherosclerosis with up to moderate narrowing at the right P2 segment and right V4 segment. Electronically Signed   By: JoJorje Guild.D.   On: 08/20/2022 10:01   CT HEAD CODE STROKE WO CONTRAST  Result Date: 08/20/2022 CLINICAL DATA:  Code stroke.  Seizure with gaze deviation EXAM: CT HEAD WITHOUT CONTRAST TECHNIQUE: Contiguous axial images were obtained from the base of the skull through the vertex without intravenous contrast. RADIATION DOSE REDUCTION: This exam was performed according to the departmental dose-optimization program which includes automated exposure control, adjustment of the mA and/or kV according to patient size and/or use of iterative reconstruction technique. COMPARISON:  04/03/2019 FINDINGS: Brain: Small area of gray-white differentiation loss in the left parietal cortex, not seen on prior and presumably a acute or subacute infarct. No hemorrhage, hydrocephalus, or masslike finding. Vascular: No hyperdense vessel or unexpected calcification. Skull: Normal. Negative for fracture or focal lesion. Sinuses/Orbits: No acute finding. Other: Findings messaged in epic. ASPECTS ([facility] Early [facility] Score) - Ganglionic level infarction (caudate, lentiform nuclei, internal capsule, insula, M1-M3 cortex): 6 - Supraganglionic infarction (M4-M6 cortex): 3 Total score (0-10 with 10 being normal): 9 IMPRESSION: 1. Small low-density in the left parietal cortex since 2020, presumably a recent infarct.ASPECTS is 9 on the left. 2. No acute hemorrhage. Electronically Signed   By: JoJorje Guild.D.   On: 08/20/2022 09:54   ECHO TEE  Result Date: 08/16/2022    TRANSESOPHOGEAL ECHO REPORT   Patient Name:   JODORIA FERNate of Exam: 08/16/2022 Medical Rec #:  [id number]   Height:       62.0 in Accession #:    [id number] Weight:       241.0 lb Date of Birth:  5/18-Oct-1950   BSA:          2.069 m Patient Age:    [age]     BP:  Not listed in chart/Not listed in                                            chart mmHg Patient Gender: F            HR:           Not listed in chart bpm. Exam Location:  [facility] Procedure: Transesophageal Echo, Color Doppler and Cardiac Doppler Indications:     Bacteremia R78.81  History:         Patient has prior history of Echocardiogram examinations, most                  recent 08/14/2022. Signs/Symptoms:Dyspnea.  Sonographer:     [name] Referring Phys:  [id number] SHERI HAMMOCK Diagnosing Phys: Kate Sable MD PROCEDURE: The transesophogeal probe was passed without difficulty through the esophogus of the patient. Sedation performed by performing physician. The patient developed no complications during the procedure. Study quality degraded by patient discomfort-resp distress and pain (knee and foot pain from recent surgery).  IMPRESSIONS  1. Left ventricular ejection fraction, by estimation, is 60 to 65%. The left ventricle has normal function.  2. Right ventricular systolic function is normal. The right ventricular size is normal.  3. No left atrial/left atrial appendage thrombus was detected.  4. The mitral valve is normal in structure. Trivial mitral valve regurgitation.  5. The aortic valve is normal in structure. Aortic valve regurgitation is not visualized.  6. Study quality degraded by patient discomfort-resp distress and pain (knee and foot pain from recent surgery). Conclusion(s)/Recommendation(s): No evidence of vegetation/infective endocarditis on this transesophageael echocardiogram. FINDINGS  Left Ventricle: Left ventricular ejection fraction, by estimation, is 60 to 65%. The left ventricle has normal function. The left ventricular internal cavity size was normal in size. Right Ventricle: The right ventricular size is normal. No  increase in right ventricular wall thickness. Right ventricular systolic function is normal. Left Atrium: Left atrial size was normal in size. No left atrial/left atrial appendage thrombus was detected. Right Atrium: Right atrial size was normal in size. Pericardium: There is no evidence of pericardial effusion. Mitral Valve: The mitral valve is normal in structure. Trivial mitral valve regurgitation. Tricuspid Valve: The tricuspid valve is normal in structure. Tricuspid valve regurgitation is not demonstrated. Aortic Valve: The aortic valve is normal in structure. Aortic valve regurgitation is not visualized. Pulmonic Valve: The pulmonic valve was normal in structure. Pulmonic valve regurgitation is not visualized. Aorta: The aortic root is normal in size and structure. IAS/Shunts: No atrial level shunt detected by color flow Doppler. Kate Sable MD Electronically signed by Kate Sable MD Signature Date/Time: 08/16/2022/6:58:47 PM    Final    ECHOCARDIOGRAM COMPLETE  Result Date: 08/14/2022    ECHOCARDIOGRAM REPORT   Patient Name:   THETIS SCHWIMMER Date of Exam: 08/14/2022 Medical Rec #:  [id number]    Height:       62.0 in Accession #:    [id number]   Weight:       241.0 lb Date of Birth:  1951-05-13     BSA:          2.069 m Patient Age:    80 years     BP:           154/82 mmHg Patient Gender: F  HR:           Not listed in chart bpm. Exam Location:  [facility] Procedure: 2D Echo, Cardiac Doppler and Color Doppler Indications:     Bacteremia R78.81  History:         Patient has prior history of Echocardiogram examinations, most                  recent 04/04/2019. Signs/Symptoms:Dyspnea; Risk                  Factors:Diabetes. Pneumonia.  Sonographer:     [name] Referring Phys:  [place] Diagnosing Phys: Kathlyn Sacramento MD  Sonographer Comments: Suboptimal apical window. IMPRESSIONS  1. Left ventricular ejection fraction, by estimation, is 60 to 65%. The left ventricle has normal function.  The left ventricle has no regional wall motion abnormalities. There is moderate asymmetric left ventricular hypertrophy of the septal segment. Left ventricular diastolic parameters are indeterminate.  2. Right ventricular systolic function is normal. The right ventricular size is normal. Tricuspid regurgitation signal is inadequate for assessing PA pressure.  3. The mitral valve is normal in structure. No evidence of mitral valve regurgitation. No evidence of mitral stenosis. Moderate mitral annular calcification.  4. The aortic valve is normal in structure. Aortic valve regurgitation is not visualized. Aortic valve sclerosis is present, with no evidence of aortic valve stenosis. Conclusion(s)/Recommendation(s): No evidence of valvular vegetations on this transthoracic echocardiogram. Consider a transesophageal echocardiogram to exclude infective endocarditis if clinically indicated. FINDINGS  Left Ventricle: Left ventricular ejection fraction, by estimation, is 60 to 65%. The left ventricle has normal function. The left ventricle has no regional wall motion abnormalities. The left ventricular internal cavity size was normal in size. There is  moderate asymmetric left ventricular hypertrophy of the septal segment. Left ventricular diastolic parameters are indeterminate. Right Ventricle: The right ventricular size is normal. No increase in right ventricular wall thickness. Right ventricular systolic function is normal. Tricuspid regurgitation signal is inadequate for assessing PA pressure. Left Atrium: Left atrial size was normal in size. Right Atrium: Right atrial size was normal in size. Pericardium: There is no evidence of pericardial effusion. Mitral Valve: The mitral valve is normal in structure. There is mild thickening of the mitral valve leaflet(s). There is moderate calcification of the mitral valve leaflet(s). Moderate mitral annular calcification. No evidence of mitral valve regurgitation. No evidence of  mitral valve stenosis. Tricuspid Valve: The tricuspid valve is normal in structure. Tricuspid valve regurgitation is not demonstrated. No evidence of tricuspid stenosis. Aortic Valve: The aortic valve is normal in structure. Aortic valve regurgitation is not visualized. Aortic valve sclerosis is present, with no evidence of aortic valve stenosis. Aortic valve mean gradient measures 4.0 mmHg. Aortic valve peak gradient measures 7.9 mmHg. Aortic valve area, by VTI measures 4.18 cm. Pulmonic Valve: The pulmonic valve was normal in structure. Pulmonic valve regurgitation is not visualized. No evidence of pulmonic stenosis. Aorta: The aortic root is normal in size and structure. Venous: The inferior vena cava was not well visualized. IAS/Shunts: No atrial level shunt detected by color flow Doppler.  LEFT VENTRICLE PLAX 2D LVIDd:         4.10 cm   Diastology LVIDs:         2.30 cm   LV e' medial:    6.64 cm/s LV PW:         1.00 cm   LV E/e' medial:  17.6 LV IVS:  1.50 cm   LV e' lateral:   7.83 cm/s LVOT diam:     2.20 cm   LV E/e' lateral: 14.9 LV SV:         123 LV SV Index:   60 LVOT Area:     3.80 cm  RIGHT VENTRICLE RV Basal diam:  2.90 cm RV Mid diam:    2.70 cm RV S prime:     26.30 cm/s TAPSE (M-mode): 3.6 cm LEFT ATRIUM             Index        RIGHT ATRIUM           Index LA diam:        3.50 cm 1.69 cm/m   RA Area:     18.20 cm LA Vol (A2C):   70.5 ml 34.07 ml/m  RA Volume:   53.70 ml  25.95 ml/m LA Vol (A4C):   33.3 ml 16.09 ml/m LA Biplane Vol: 50.6 ml 24.45 ml/m  AORTIC VALVE AV Area (Vmax):    3.60 cm AV Area (Vmean):   3.74 cm AV Area (VTI):     4.18 cm AV Vmax:           140.50 cm/s AV Vmean:          92.450 cm/s AV VTI:            0.294 m AV Peak Grad:      7.9 mmHg AV Mean Grad:      4.0 mmHg LVOT Vmax:         133.00 cm/s LVOT Vmean:        90.900 cm/s LVOT VTI:          0.324 m LVOT/AV VTI ratio: 1.10  AORTA Ao Root diam: 3.14 cm MITRAL VALVE                TRICUSPID VALVE MV Area  (PHT): 4.29 cm     TR Peak grad:   13.4 mmHg MV Decel Time: 177 msec     TR Vmax:        183.00 cm/s MV E velocity: 117.00 cm/s MV A velocity: 137.00 cm/s  SHUNTS MV E/A ratio:  0.85         Systemic VTI:  0.32 m                             Systemic Diam: 2.20 cm Kathlyn Sacramento MD Electronically signed by Kathlyn Sacramento MD Signature Date/Time: 08/14/2022/5:54:29 PM    Final    DG Chest 2 View  Result Date: 08/11/2022 CLINICAL DATA:  Aspiration pneumonia EXAM: CHEST - 2 VIEW COMPARISON:  10/19/21 CXR FINDINGS: Pleural effusion. No pneumothorax. There may be patchy airspace opacities at the lung bases there are only seen on the lateral view. Normal cardiac and mediastinal contours. No displaced rib fractures. Visualized upper abdomen is unremarkable. Degenerative changes of the left glenohumeral and AC joint. IMPRESSION: Possible patchy airspace opacities at the lung bases that are only seen on the lateral view, which could represent atelectasis, aspiration or infection. Electronically Signed   By: Marin Roberts M.D.   On: 08/11/2022 08:44   DG Foot 2 Views Left  Result Date: 08/10/2022 CLINICAL DATA:  Left foot pain status post fall EXAM: LEFT FOOT - 2 VIEW COMPARISON:  Radiographs 10/08/2020 FINDINGS: Extensive postoperative changes about the talus and calcaneus. Fusion of the subtalar joint. No acute fracture  or dislocation. Transmetatarsal amputation of the fifth metatarsal. Pes planus. Soft tissue swelling diffusely about the foot. Ulcer about the plantar surface of the great toe. IMPRESSION: No acute osseous abnormality. Extensive postoperative changes about the talus and calcaneus. Diffuse soft tissue swelling. Great toe ulcer. Electronically Signed   By: Placido Sou M.D.   On: 08/10/2022 23:08   DG Hip Unilat With Pelvis 2-3 Views Left  Result Date: 08/10/2022 CLINICAL DATA:  Status post fall. EXAM: DG HIP (WITH OR WITHOUT PELVIS) 2-3V LEFT COMPARISON:  None Available. FINDINGS: There is no  evidence of hip fracture or dislocation. Mild to moderate severity degenerative changes are seen in the form of joint space narrowing and acetabular sclerosis. IMPRESSION: Mild to moderate severity degenerative changes without evidence of acute osseous abnormality. Electronically Signed   By: Virgina Norfolk M.D.   On: 08/10/2022 19:57   DG Knee Complete 4 Views Right  Result Date: 08/10/2022 CLINICAL DATA:  Status post fall. EXAM: RIGHT KNEE - COMPLETE 4+ VIEW COMPARISON:  None Available. FINDINGS: No evidence of an acute fracture or dislocation. Medial and lateral marginal osteophyte formation is noted. There is marked severity patellofemoral joint space narrowing. Moderate to marked severity medial and lateral tibiofemoral compartment space narrowing is also seen. There is a small joint effusion. IMPRESSION: 1. 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 2. Small joint effusion. Electronically Signed   By: Virgina Norfolk M.D.   On: 08/10/2022 19:56   DG Knee Complete 4 Views Left  Result Date: 08/10/2022 CLINICAL DATA:  Status post fall. EXAM: LEFT KNEE - COMPLETE 4+ VIEW COMPARISON:  None Available. FINDINGS: No evidence of an acute fracture or dislocation. Medial and lateral marginal osteophyte formation is seen. There is moderate severity patellofemoral joint space narrowing. Moderate severity medial and lateral tibiofemoral compartment space narrowing is also seen. A small to moderate sized joint effusion is noted. IMPRESSION: 1. Moderate severity tricompartmental osteoarthritis. 2. Small to moderate sized joint effusion. Electronically Signed   By: Virgina Norfolk M.D.   On: 08/10/2022 19:55       The results of significant diagnostics from this hospitalization (including imaging, microbiology, ancillary and laboratory) are listed below for reference.     Microbiology: No results found for this or any previous visit (from the past 240 hour(s)).    Labs:  CBC: Recent Labs  Lab 08/24/22 0315 08/25/22 0553 08/26/22 0532 08/27/22 0520 08/28/22 0455  WBC 16.2* 13.9* 11.7* 9.5 8.8  HGB 9.1* 9.0* 8.9* 9.1* 8.9*  HCT 27.5* 27.7* 27.2* 28.5* 27.3*  MCV 85.1 86.3 85.8 86.9 86.1  PLT 395 359 340 332 306   BMP &GFR Recent Labs  Lab 08/24/22 0315 08/25/22 0553 08/26/22 0532 08/27/22 0520 08/28/22 0455  NA 138 138 135 139 138  K 4.1 4.1 4.7 5.2* 5.1  CL 108 106 105 111 106  CO2 _0 GLUCOSE 232* 218* 301* 204* 216*  BUN 29* 31* 34* 28* 32*  CREATININE 1.21* 1.34* 1.21* 1.13* 1.23*  CALCIUM 8.7* 8.8* 8.6* 8.9 9.2  MG 1.9 1.7 2.1 1.8 1.8   Estimated Creatinine Clearance: 47.5 mL/min (A) (by C-G formula based on SCr of 1.23 mg/dL (H)). Liver & Pancreas: No results for input(s): "AST", "ALT", "ALKPHOS", "BILITOT", "PROT", "ALBUMIN" in the last 168 hours. No results for input(s): "LIPASE", "AMYLASE" in the last 168 hours. No results for input(s): "AMMONIA" in the last 168 hours. Diabetic: No results for input(s): "HGBA1C" in the last 72  hours. Recent Labs  Lab 08/27/22 1654 08/27/22 2100 08/28/22 0756 08/28/22 1126 08/28/22 1129  GLUCAP 165* 280* 217* 415* 394*   Cardiac Enzymes: Recent Labs  Lab 08/25/22 0553 08/26/22 0532 08/27/22 0520 08/28/22 0455  CKTOTAL 33* 32* 32* 31*   No results for input(s): "PROBNP" in the last 8760 hours. Coagulation Profile: No results for input(s): "INR", "PROTIME" in the last 168 hours. Thyroid Function Tests: No results for input(s): "TSH", "T4TOTAL", "FREET4", "T3FREE", "THYROIDAB" in the last 72 hours. Lipid Profile: No results for input(s): "CHOL", "HDL", "LDLCALC", "TRIG", "CHOLHDL", "LDLDIRECT" in the last 72 hours. Anemia Panel: No results for input(s): "VITAMINB12", "FOLATE", "FERRITIN", "TIBC", "IRON", "RETICCTPCT" in the last 72 hours. Urine analysis:    Component Value Date/Time   COLORURINE YELLOW (A) 08/10/2022 1810   APPEARANCEUR HAZY (A)  08/10/2022 1810   LABSPEC 1.016 08/10/2022 1810   PHURINE 5.0 08/10/2022 1810   GLUCOSEU >=500 (A) 08/10/2022 1810   HGBUR LARGE (A) 08/10/2022 1810   BILIRUBINUR NEGATIVE 08/10/2022 1810   BILIRUBINUR negative 01/30/2018 1233   KETONESUR 20 (A) 08/10/2022 1810   PROTEINUR 100 (A) 08/10/2022 1810   UROBILINOGEN negative (A) 01/30/2018 1233   NITRITE NEGATIVE 08/10/2022 1810   LEUKOCYTESUR NEGATIVE 08/10/2022 1810   Sepsis Labs: Invalid input(s): "PROCALCITONIN", "LACTICIDVEN"   SIGNED:  Mercy Riding, MD  [facility] 08/28/2022, 12:38 PM

## 2022-08-28 NOTE — Progress Notes (Signed)
ID Pt is feeling much better No headache No fever    O/e looks well No distress Patient Vitals for the past 24 hrs:  BP Temp Temp src Pulse Resp SpO2  08/28/22 0900 (!) 139/57 -- -- 68 -- --  08/28/22 0754 (!) 137/56 97.7 F (36.5 C) -- 64 16 99 %  08/28/22 0520 (!) 146/62 97.9 F (36.6 C) Oral 61 16 100 %  08/28/22 0018 (!) 154/64 97.6 F (36.4 C) Oral 66 16 98 %  08/27/22 2107 (!) 137/49 98.7 F (37.1 C) -- 61 18 98 %  08/27/22 1653 134/60 98.7 F (37.1 C) -- 66 18 98 %  08/27/22 1141 (!) 149/54 97.7 F (36.5 C) -- (!) 59 16 98 %  08/27/22 1141 (!) 149/54 97.7 F (36.5 C) -- 62 16 98 %   No neck rigidity Chest b/l air entry Hs s1s2 Abd soft Left knee mobility much better Left foot- healing well Charcot deformity left foot CNS non focal  Labs    Latest Ref Rng & Units 08/28/2022    4:55 AM 08/27/2022    5:20 AM 08/26/2022    5:32 AM  CBC  WBC 4.0 - 10.5 K/uL 8.8  9.5  11.7   Hemoglobin 12.0 - 15.0 g/dL 8.9  9.1  8.9   Hematocrit 36.0 - 46.0 % 27.3  28.5  27.2   Platelets 150 - 400 K/uL 306  332  340        Latest Ref Rng & Units 08/28/2022    4:55 AM 08/27/2022    5:20 AM 08/26/2022    5:32 AM  CMP  Glucose 70 - 99 mg/dL 216  204  301   BUN 8 - 23 mg/dL 32  28  34   Creatinine 0.44 - 1.00 mg/dL 1.23  1.13  1.21   Sodium 135 - 145 mmol/L 138  139  135   Potassium 3.5 - 5.1 mmol/L 5.1  5.2  4.7   Chloride 98 - 111 mmol/L 106  111  105   CO2 22 - 32 mmol/L '26  26  26   '$ Calcium 8.9 - 10.3 mg/dL 9.2  8.9  8.6     Micro 11/30 BC Strep pneumo 12/1 BC- NG Left foot wound- Ng so far  Impression/recommendation   ?71 yr female presenting with multiple falls And Left knee pain for 3 days  Strep pneumo bacteremia  TEE negative for  endocarditis Received 17 days of IV ceftriaxone ( including 7 days of meningitis dose)  From 08/27/22 she will go on amoxicillin 1 gram Po q 8 until 09/09/22 To treat possible left septic arthritis  Acute onset of  headache and seizure and was encephalopathic Resolved was due to PRES  Not herpes encephalitis  Treated like Strep pneumo meninigits eventhoug less likley - as LP could not be done  completed 7 days of meningitis dose of ceftriaxone which will be tomorrow.    Left knee acute gouty arthritis with possible septic arthritis. Much better after intraarticular steroid. Will give antibiotics until 12/30.23   diabetes mellitus with neuropathy, charcot foot and wound left foot X 2 The left lateral foot wound is much better MSSA in culture   Rhabdomyolysis- due to fall- resolved   AKI   Charcot foot- H/o left Ankle surgey- has hardware     DM  Discussed the management with patient and care team Pt is going to rehab today

## 2022-08-28 NOTE — TOC Progression Note (Addendum)
Transition of Care [facility]) - Progression Note    Patient Details  Name: Summer [name] MRN: [id number] Date of Birth: 03-Jul-1951  [facility] CM/SW [place], [place] Phone Number: 08/28/2022, 9:45 AM  Clinical Narrative:     TOC sent auth ID [id number] to [facility] for dates 12/18-12/25.  TOC left a message for Mallory Shirk at [facility] regarding insurance authorization.  Patient has told TOC and PTA that she does not want to attend rehab. TOC explained the purpose and benefits of SNF rehab to the patient.  TOC explained that there are no other local SNF accepting. Her friend cannot provide 24/7 care. TOC to return in a few hours to revisit this conversation.  1228: Patient is agreeable to SNF Rehab after speaking to her friend Darrel. Patient will not need a cpap at the SNF.  Expected Discharge Plan: [place] Barriers to Discharge: Continued Medical Work up  Expected Discharge Plan and Services Expected Discharge Plan: [place] In-house Referral: Clinical Social Work   Post Acute Care Choice: [place] Living arrangements for the past 2 months: Single Family Home Expected Discharge Date: 08/28/22                                     Social Determinants of Health (SDOH) Interventions    Readmission Risk Interventions     No data to display

## 2022-08-28 NOTE — Care Management Important Message (Signed)
Important Message  Patient Details  Name: Summer [name] MRN: [id number] Date of Birth: Oct 10, 1950   Medicare Important Message Given:  Yes     Juliann Pulse A Saanvi Hakala 08/28/2022, 11:18 AM

## 2022-08-28 NOTE — Progress Notes (Signed)
Physical Therapy Treatment Patient Details Name: Summer [name] MRN: [id number] DOB: 1951-01-19 Today's Date: 08/28/2022   History of Present Illness 71 y.o. female with medical history significant for Diabetes with polyneuropathy with a chronic left foot wound previously seen at the wound care clinic, OSA, CKD stage IIIb, depression, HTN, HFpEF, class III obesity, OSA and hypothyroidism,Who presents to the ED via EMS with frequent falls over the past 3 days, now with severe left knee pain.  L knee tap reveals gout.   Hospital course additionally significant for seizure activity with subsequent code blue (did not lose pulse) on 12/10 due to respiratory concerns (aborted due to DNR/DNI status per notes).    PT Comments    Pt ready for session.  Overlap with OT for mobility skills as pt is +2 assist.  Pt is saturated in urine in bed.  She is able to get to EOB with rail and increased time.  Steady in sitting.  OT in to assist with transfer to [facility] for bathing and BM.  Once finished.  Assisted OT with transfer back to bed.  Pt with very hesitant slow steps with L knee buckling and guidance needed by staff for safety.  Discussed at length discharge.  She is stating she does not and will not go go [facility] where bed is offered for rehab.  Wants to stay local.  Reviewed bed offers and need to transition out of hospital when deemed medically ready by MD.  Stated she will go home if no local bed is found.  Stated Coralyn Pear will help her.  She is unrealistic in her mobility when she returns home.  Reviewed +2 assist for transfers and that I would not feel comfortable moving her on my own and that she should be concerned for her and his safety if they try to transfer on their own.  Question pt's ability to safely get out of car and into wheelchair at home.  Stated she will walk up the ramp to get into home.  "I walked when I can here I can walk when I get back home"  Pt has only taken 2 very precarious steps in the  past few sessions and is at very high risk for falls.  Reached out to [place] regarding concerns for discharge home.  While pt has been educated at length regarding my concerns and high risk for falls/readmission if she chooses to go home, pt remains to lean towards discharge home which is her choice if she makes it.   Recommendations for follow up therapy are one component of a multi-disciplinary discharge planning process, led by the attending physician.  Recommendations may be updated based on patient status, additional functional criteria and insurance authorization.  Follow Up Recommendations  Skilled nursing-short term rehab (<3 hours/day)     Assistance Recommended at Discharge Frequent or constant Supervision/Assistance  Patient can return home with the following Two people to help with walking and/or transfers;Two people to help with bathing/dressing/bathroom;Assistance with cooking/housework;Help with stairs or ramp for entrance;Assist for transportation   Equipment Recommendations  Other (comment) (pt reports having all equipement.  if she insists on going home a lift may be beneficial since she is +2 transfers with only +1 at home.)    Recommendations for Other Services       Precautions / Restrictions Precautions Precautions: Fall Restrictions Weight Bearing Restrictions: Yes LLE Weight Bearing: Weight bearing as tolerated Other Position/Activity Restrictions: L post op shoe     Mobility  Bed  Mobility Overal bed mobility: Needs Assistance Bed Mobility: Supine to Sit, Sit to Supine     Supine to sit: Min guard, Min assist, HOB elevated Sit to supine: Min guard, Min assist   General bed mobility comments: heavy use of rails and HOB elevated    Transfers Overall transfer level: Needs assistance Equipment used: Rolling walker (2 wheels) Transfers: Sit to/from Stand Sit to Stand: Min assist, Mod assist, +2 physical assistance           General transfer comment:  transfers unsafely to/from [facility] with L knee buckling and guidance from staff    Ambulation/Gait Ambulation/Gait assistance: Mod assist, +2 physical assistance Gait Distance (Feet): 2 Feet Assistive device: Rolling walker (2 wheels)   Gait velocity: decreased     General Gait Details: several small sidesteps along bed with +2 assist,  able to transfer to chair today but high fall risk   Stairs             Wheelchair Mobility    Modified Rankin (Stroke Patients Only)       Balance Overall balance assessment: Needs assistance Sitting-balance support: Bilateral upper extremity supported Sitting balance-Leahy Scale: Good     Standing balance support: Bilateral upper extremity supported Standing balance-Leahy Scale: Poor Standing balance comment: relaint on walker and +2 staff, poor steps to turn                            Cognition Arousal/Alertness: Awake/alert Behavior During Therapy: WFL for tasks assessed/performed Overall Cognitive Status: Within Functional Limits for tasks assessed                                 General Comments: unreaslistic ideas of mobility if she returns home        Exercises Other Exercises Other Exercises: bathing and BM on commode.    General Comments        Pertinent Vitals/Pain Pain Assessment Pain Assessment: Faces Faces Pain Scale: Hurts a little bit Pain Location: knees Pain Descriptors / Indicators: Grimacing, Aching Pain Intervention(s): Limited activity within patient's tolerance, Monitored during session, Repositioned    Home Living                          Prior Function            PT Goals (current goals can now be found in the care plan section) Progress towards PT goals: Progressing toward goals    Frequency    Min 2X/week      PT Plan Current plan remains appropriate    Co-evaluation PT/OT/SLP Co-Evaluation/Treatment: Yes Reason for Co-Treatment: For  patient/therapist safety PT goals addressed during session: Mobility/safety with mobility OT goals addressed during session: ADL's and self-care      AM-PAC PT "6 Clicks" Mobility   Outcome Measure  Help needed turning from your back to your side while in a flat bed without using bedrails?: A Little Help needed moving from lying on your back to sitting on the side of a flat bed without using bedrails?: A Little Help needed moving to and from a bed to a chair (including a wheelchair)?: A Lot Help needed standing up from a chair using your arms (e.g., wheelchair or bedside chair)?: A Lot Help needed to walk in hospital room?: A Lot Help needed climbing 3-5 steps with a railing? :  Total 6 Click Score: 13    End of Session Equipment Utilized During Treatment: Gait belt Activity Tolerance: Patient tolerated treatment well Patient left: in bed;with call bell/phone within reach;with bed alarm set Nurse Communication: Mobility status PT Visit Diagnosis: Muscle weakness (generalized) (M62.81);Difficulty in walking, not elsewhere classified (R26.2);Pain Pain - part of body: Knee     Time: 0921-0930 PT Time Calculation (min) (ACUTE ONLY): 9 min  Charges:  $Therapeutic Activity: 8-22 mins                   Chesley Noon, PTA 08/28/22, 10:27 AM

## 2022-08-28 NOTE — TOC Transition Note (Signed)
Transition of Care [facility]) - CM/SW Discharge Note   Patient Details  Name: Summer [name] MRN: [id number] Date of Birth: [birth year]  Transition of Care [facility]) CM/SW Contact:  Colen Darling, [place] Phone Number: 08/28/2022, 12:47 PM   Clinical Narrative:     Friend notified of transfer to [facility] today. Patient agreeable to SNF rehab. RN calling report. TOC calling [facility].  Final next level of care: Skilled Nursing Facility Barriers to Discharge: Barriers Resolved   Patient Goals and CMS Choice Patient states their goals for this hospitalization and ongoing recovery are:: transfer to rehab CMS Medicare.gov Compare Post Acute Care list provided to:: Patient Choice offered to / list presented to : Patient  Discharge Placement PASRR number recieved: 08/13/22 Existing PASRR number confirmed : 08/28/22          Patient chooses bed at: Other - please specify in the comment section below: ([facility]) Patient to be transferred to facility by: [facility] Name of family member notified: Darrel [phone] Patient and family notified of of transfer: 08/28/22  Discharge Plan and Services In-house Referral: Clinical Social Work   Post Acute Care Choice: [place]                               Social Determinants of Health (SDOH) Interventions     Readmission Risk Interventions     No data to display

## 2022-08-28 NOTE — Progress Notes (Addendum)
Occupational Therapy Treatment Patient Details Name: Summer [name] MRN: [id number] DOB: 05-02-1951 Today's Date: 08/28/2022   History of present illness 71 y.o. female with medical history significant for Diabetes with polyneuropathy with a chronic left foot wound previously seen at the wound care clinic, OSA, CKD stage IIIb, depression, HTN, HFpEF, class III obesity, OSA and hypothyroidism,Who presents to the ED via EMS with frequent falls over the past 3 days, now with severe left knee pain.  L knee tap reveals gout.   Hospital course additionally significant for seizure activity with subsequent code blue (did not lose pulse) on 12/10 due to respiratory concerns (aborted due to DNR/DNI status per notes).   OT comments  Patient received semi-reclined in bed and agreeable to OT. Partial overlap with PT for pt/therapist safety 2/2 pt requiring +2 during OOB mobility. Pt required physical assistance this date to complete bed mobility, stand from EOB, and take steps to/from [facility] using RW. Pt/bed found to be saturated in urine, linen change completed. Pt engaged in full body bathing while sitting on BSC and had continent BM. OT noting L knee buckling in standing requiring physical assistance to correct. Pt left as received with all needs in reach.   Pt endorsing desire to D/C home with boyfriend. Therapists discussing safety concerns with being high fall risk and amount of assistance pt is currently requiring for mobility. Pt verbalized understanding, however, still adamant on going home. Current D/C recommendation for SNF remains appropriate. OT will continue to follow acutely.    Recommendations for follow up therapy are one component of a multi-disciplinary discharge planning process, led by the attending physician.  Recommendations may be updated based on patient status, additional functional criteria and insurance authorization.    Follow Up Recommendations  Skilled nursing-short term rehab (<3  hours/day)     Assistance Recommended at Discharge Frequent or constant Supervision/Assistance  Patient can return home with the following  A lot of help with walking and/or transfers;A lot of help with bathing/dressing/bathroom;Assistance with cooking/housework;Help with stairs or ramp for entrance;Assist for transportation   Equipment Recommendations  None recommended by OT    Recommendations for Other Services      Precautions / Restrictions Precautions Precautions: Fall Restrictions Weight Bearing Restrictions: Yes LLE Weight Bearing: Weight bearing as tolerated Other Position/Activity Restrictions: L post op shoe       Mobility Bed Mobility Overal bed mobility: Independent Bed Mobility: Supine to Sit, Sit to Supine     Supine to sit: Min guard, Min assist, HOB elevated Sit to supine: Min guard, Min assist   General bed mobility comments: heavy use of rails and HOB elevated    Transfers Overall transfer level: Needs assistance Equipment used: Rolling walker (2 wheels) Transfers: Sit to/from Stand Sit to Stand: Min assist, Mod assist, +2 physical assistance                 Balance Overall balance assessment: Needs assistance Sitting-balance support: Bilateral upper extremity supported Sitting balance-Leahy Scale: Good     Standing balance support: Bilateral upper extremity supported, Reliant on assistive device for balance Standing balance-Leahy Scale: Poor Standing balance comment: L knee buckling                           ADL either performed or assessed with clinical judgement   ADL Overall ADL's : Needs assistance/impaired     Grooming: Wash/dry face;Set up;Sitting   Upper Body Bathing: Minimal assistance;Sitting Upper  Body Bathing Details (indicate cue type and reason): assist for reaching back Lower Body Bathing: Minimal assistance;Sitting/lateral leans Lower Body Bathing Details (indicate cue type and reason): assist for reaching  feet Upper Body Dressing : Minimal assistance;Sitting Upper Body Dressing Details (indicate cue type and reason): gown     Toilet Transfer: Ambulation;BSC/3in1;Rolling walker (2 wheels);Moderate assistance;+2 for physical assistance Toilet Transfer Details (indicate cue type and reason): short ambulatory transfer Toileting- Clothing Manipulation and Hygiene: Maximal assistance;Sit to/from stand Toileting - Clothing Manipulation Details (indicate cue type and reason): for posterior hygiene after continent BM     Functional mobility during ADLs: Moderate assistance;Rolling walker (2 wheels);+2 for physical assistance (~2 ft from EOB<>BSC)      Extremity/Trunk Assessment Upper Extremity Assessment Upper Extremity Assessment: Generalized weakness   Lower Extremity Assessment Lower Extremity Assessment: Generalized weakness        Vision Patient Visual Report: No change from baseline     Perception     Praxis      Cognition Arousal/Alertness: Awake/alert Behavior During Therapy: WFL for tasks assessed/performed Overall Cognitive Status: Within Functional Limits for tasks assessed                                          Exercises      Shoulder Instructions       General Comments      Pertinent Vitals/ Pain       Pain Assessment Pain Assessment: Faces Faces Pain Scale: Hurts a little bit Pain Location: knees Pain Descriptors / Indicators: Grimacing, Aching Pain Intervention(s): Limited activity within patient's tolerance, Monitored during session, Repositioned  Home Living                                          Prior Functioning/Environment              Frequency  Min 2X/week        Progress Toward Goals  OT Goals(current goals can now be found in the care plan section)  Progress towards OT goals: Progressing toward goals  Acute Rehab OT Goals Patient Stated Goal: go home OT Goal Formulation: With  patient Time For Goal Achievement: 09/09/22 Potential to Achieve Goals: Good  Plan Discharge plan remains appropriate;Frequency remains appropriate    Co-evaluation    PT/OT/SLP Co-Evaluation/Treatment: Yes Reason for Co-Treatment: For patient/therapist safety;To address functional/ADL transfers PT goals addressed during session: Mobility/safety with mobility OT goals addressed during session: ADL's and self-care      AM-PAC OT "6 Clicks" Daily Activity     Outcome Measure   Help from another person eating meals?: A Little Help from another person taking care of personal grooming?: A Little Help from another person toileting, which includes using toliet, bedpan, or urinal?: A Lot Help from another person bathing (including washing, rinsing, drying)?: A Lot Help from another person to put on and taking off regular upper body clothing?: A Little Help from another person to put on and taking off regular lower body clothing?: A Lot 6 Click Score: 15    End of Session Equipment Utilized During Treatment: Gait belt;Rolling walker (2 wheels)  OT Visit Diagnosis: Unsteadiness on feet (R26.81);History of falling (Z91.81);Muscle weakness (generalized) (M62.81);Pain   Activity Tolerance Patient tolerated treatment well   Patient Left in bed;with  call bell/phone within reach;with bed alarm set   Nurse Communication Mobility status;Other (comment) (continent BM)        Time: 4259-5638 OT Time Calculation (min): 8 min  Charges: OT General Charges $OT Visit: 1 Visit OT Treatments $Self Care/Home Management : 8-22 mins  [facility] MS, OTR/L ascom [phone]  08/28/22, 12:53 PM

## 2022-08-28 NOTE — Progress Notes (Signed)
   08/28/22 1248  Medical Necessity for Transport Certificate --- IF THIS TRANSPORT IS ROUND TRIP OR SCHEDULED AND REPEATED, A PHYSICIAN MUST COMPLETE THIS FORM  Transport from: Teacher, English as a foreign language) Doctor, hospital to (Location) Other (Comment) ([facility])  Did the patient arrive from a [place], [place] or Group Home? No  Is this the closest appropriate facility? Yes  Date of Transport Service 08/28/22  Name of [facility]  Round Trip Transport? No  Reason for Transport Discharge  Is this a hospice patient? No  Describe the Medical Condition bacteremia due to streptococous pneumoniae, sepsis, traumatic rhabdomyolysis, acute pain left knee, ulcer of left foot due to type 2 DM, acute renal failure, sleep apnea, hypothyroidisim, obesity, heart failure, depression, acidosis, falls, acidosis, falls, arthritis, ulcer of left foot, seizure, encephalopathy, metabolic encephalopathy  Q1 Are ALL the following "true"? 1. Patient unable to get up from bed without assistance  AND  2. Unable to ambulate  AND  3. Unable to sit in a chair, including wheelchair. Yes  Q2 Could the patient be transported safely by other means of transportation (I.E., wheelchair van)? No  Q3 Please check any of the following conditions that apply at the time of transport: Obesity;Seizure prone and requires monitoring;Risk of injury to self and/or others  Electronic Signature Colen Darling  Credentials DP  Date Signed 08/28/22

## 2022-08-28 NOTE — Telephone Encounter (Signed)
Patient was discharged from [facility]. Can you schedule a follow-up with Dr. Amalia Hailey

## 2022-08-29 DIAGNOSIS — S91302A Unspecified open wound, left foot, initial encounter: Secondary | ICD-10-CM | POA: Diagnosis not present

## 2022-08-29 DIAGNOSIS — E119 Type 2 diabetes mellitus without complications: Secondary | ICD-10-CM | POA: Diagnosis not present

## 2022-08-29 DIAGNOSIS — R7881 Bacteremia: Secondary | ICD-10-CM | POA: Diagnosis not present

## 2022-08-29 DIAGNOSIS — I10 Essential (primary) hypertension: Secondary | ICD-10-CM | POA: Diagnosis not present

## 2022-08-29 DIAGNOSIS — I509 Heart failure, unspecified: Secondary | ICD-10-CM | POA: Diagnosis not present

## 2022-08-29 DIAGNOSIS — R569 Unspecified convulsions: Secondary | ICD-10-CM | POA: Diagnosis not present

## 2022-08-29 DIAGNOSIS — N179 Acute kidney failure, unspecified: Secondary | ICD-10-CM | POA: Diagnosis not present

## 2022-08-29 DIAGNOSIS — M109 Gout, unspecified: Secondary | ICD-10-CM | POA: Diagnosis not present

## 2022-08-30 DIAGNOSIS — M6282 Rhabdomyolysis: Secondary | ICD-10-CM | POA: Diagnosis not present

## 2022-08-30 DIAGNOSIS — N179 Acute kidney failure, unspecified: Secondary | ICD-10-CM | POA: Diagnosis not present

## 2022-08-30 DIAGNOSIS — M25562 Pain in left knee: Secondary | ICD-10-CM | POA: Diagnosis not present

## 2022-08-30 DIAGNOSIS — W19XXXA Unspecified fall, initial encounter: Secondary | ICD-10-CM | POA: Diagnosis not present

## 2022-08-30 DIAGNOSIS — E039 Hypothyroidism, unspecified: Secondary | ICD-10-CM | POA: Diagnosis not present

## 2022-08-30 DIAGNOSIS — S91302A Unspecified open wound, left foot, initial encounter: Secondary | ICD-10-CM | POA: Diagnosis not present

## 2022-08-30 DIAGNOSIS — M1712 Unilateral primary osteoarthritis, left knee: Secondary | ICD-10-CM | POA: Diagnosis not present

## 2022-08-30 DIAGNOSIS — E119 Type 2 diabetes mellitus without complications: Secondary | ICD-10-CM | POA: Diagnosis not present

## 2022-08-30 DIAGNOSIS — E875 Hyperkalemia: Secondary | ICD-10-CM | POA: Diagnosis not present

## 2022-08-30 DIAGNOSIS — R001 Bradycardia, unspecified: Secondary | ICD-10-CM | POA: Diagnosis not present

## 2022-08-30 DIAGNOSIS — G8929 Other chronic pain: Secondary | ICD-10-CM | POA: Diagnosis not present

## 2022-08-30 DIAGNOSIS — Z7401 Bed confinement status: Secondary | ICD-10-CM | POA: Diagnosis not present

## 2022-08-30 DIAGNOSIS — M79605 Pain in left leg: Secondary | ICD-10-CM | POA: Diagnosis not present

## 2022-08-30 DIAGNOSIS — A419 Sepsis, unspecified organism: Secondary | ICD-10-CM | POA: Diagnosis not present

## 2022-08-30 DIAGNOSIS — R531 Weakness: Secondary | ICD-10-CM | POA: Diagnosis not present

## 2022-08-30 DIAGNOSIS — I10 Essential (primary) hypertension: Secondary | ICD-10-CM | POA: Diagnosis not present

## 2022-08-30 DIAGNOSIS — R739 Hyperglycemia, unspecified: Secondary | ICD-10-CM | POA: Diagnosis not present

## 2022-08-30 DIAGNOSIS — D649 Anemia, unspecified: Secondary | ICD-10-CM | POA: Diagnosis not present

## 2022-08-31 ENCOUNTER — Telehealth: Payer: Self-pay | Admitting: Family Medicine

## 2022-08-31 ENCOUNTER — Ambulatory Visit: Payer: Medicare PPO

## 2022-08-31 ENCOUNTER — Telehealth: Payer: Self-pay

## 2022-08-31 DIAGNOSIS — E119 Type 2 diabetes mellitus without complications: Secondary | ICD-10-CM | POA: Diagnosis not present

## 2022-08-31 DIAGNOSIS — R2681 Unsteadiness on feet: Secondary | ICD-10-CM | POA: Diagnosis not present

## 2022-08-31 DIAGNOSIS — R001 Bradycardia, unspecified: Secondary | ICD-10-CM | POA: Diagnosis not present

## 2022-08-31 DIAGNOSIS — W19XXXA Unspecified fall, initial encounter: Secondary | ICD-10-CM | POA: Diagnosis not present

## 2022-08-31 DIAGNOSIS — R54 Age-related physical debility: Secondary | ICD-10-CM | POA: Diagnosis not present

## 2022-08-31 DIAGNOSIS — R52 Pain, unspecified: Secondary | ICD-10-CM | POA: Diagnosis not present

## 2022-08-31 DIAGNOSIS — R5381 Other malaise: Secondary | ICD-10-CM | POA: Diagnosis not present

## 2022-08-31 DIAGNOSIS — M109 Gout, unspecified: Secondary | ICD-10-CM | POA: Diagnosis not present

## 2022-08-31 DIAGNOSIS — S91102A Unspecified open wound of left great toe without damage to nail, initial encounter: Secondary | ICD-10-CM | POA: Diagnosis not present

## 2022-08-31 DIAGNOSIS — E878 Other disorders of electrolyte and fluid balance, not elsewhere classified: Secondary | ICD-10-CM | POA: Diagnosis not present

## 2022-08-31 NOTE — Progress Notes (Signed)
Chronic Care Management Pharmacy Assistant   Name: Summer [name]  MRN: [id number] DOB: November 04, 1950  Reason for Encounter: Medication Review/General Adherence Call.   Recent office visits:  None ID  Recent consult visits:  None ID  Hospital visits:  Medication Reconciliation was completed by comparing discharge summary, patient's EMR and Pharmacy list, and upon discussion with patient.  Admitted to the hospital on 08/10/2022 due to Sepsis without acute organ dysfunction. Discharge date was 08/28/2022. Discharged from [place]?Medications Started at [facility] Discharge:?? -started amoxicillin 500 MG capsule 2 capsules by mouth every 8 hours for 13 days.   Medication Changes at Hospital Discharge: -Changed amlodipine to 10 mg daily ,  Medications Discontinued at Hospital Discharge: -Stopped bupropion due to risk of seizure.  -Stopped fluconazole -Stopped irbesartan-hydrochlorothiazide -Stopped ketoconazole  Medications that remain the same after Hospital Discharge:??  -All other medications will remain the same.    Medications: Outpatient Encounter Medications as of 08/31/2022  Medication Sig   ACCU-CHEK SOFTCLIX LANCETS lancets    acetaminophen (TYLENOL 8 HOUR) 650 MG CR tablet Take 1 tablet (650 mg total) by mouth every 8 (eight) hours as needed for pain.   albuterol (VENTOLIN HFA) 108 (90 Base) MCG/ACT inhaler Inhale 2 puffs into the lungs every 6 (six) hours as needed for wheezing or shortness of breath.   Alcohol Swabs (B-D SINGLE USE SWABS REGULAR) PADS    allopurinol (ZYLOPRIM) 100 MG tablet Take 0.5 tablets (50 mg total) by mouth daily.   amLODipine (NORVASC) 10 MG tablet Take 1 tablet (10 mg total) by mouth daily.   amoxicillin (AMOXIL) 500 MG capsule Take 2 capsules (1,000 mg total) by mouth every 8 (eight) hours for 13 days.   ASPIRIN LOW DOSE 81 MG EC tablet TAKE 1 TABLET (81 MG TOTAL) BY MOUTH DAILY.   Blood Glucose Monitoring Suppl  (ACCU-CHEK AVIVA PLUS) w/Device KIT    Budeson-Glycopyrrol-Formoterol (BREZTRI AEROSPHERE) 160-9-4.8 MCG/ACT AERO Inhale 2 puffs into the lungs in the morning and at bedtime.   Cholecalciferol (VITAMIN D3) 25 MCG (1000 UT) CAPS Take 1,000 Units by mouth.   Continuous Blood Gluc Sensor (FREESTYLE LIBRE 2 SENSOR) MISC FOR GLUCOSE MONITORING   Cyanocobalamin (VITAMIN B12 PO) Take 1 tablet by mouth daily.    ezetimibe (ZETIA) 10 MG tablet Take 1 tablet (10 mg total) by mouth daily.   feeding supplement (ENSURE ENLIVE / ENSURE PLUS) LIQD Take 237 mLs by mouth 3 (three) times daily.   furosemide (LASIX) 20 MG tablet Take 1 tablet (20 mg total) by mouth daily.   insulin NPH-regular Human (70-30) 100 UNIT/ML injection Inject 45 units after breakfast and dinner if eating > 50% of meals   levothyroxine (SYNTHROID) 25 MCG tablet Take 1 tablet (25 mcg total) by mouth daily before breakfast. Two on Sunday   metFORMIN (GLUCOPHAGE) 500 MG tablet Take 2 tablets by mouth daily with breakfast.   metoprolol tartrate (LOPRESSOR) 50 MG tablet Take 1 tablet (50 mg total) by mouth 2 (two) times daily.   Misc Natural Products (OSTEO BI-FLEX ADV JOINT SHIELD PO) Take by mouth 2 (two) times daily.    modafinil (PROVIGIL) 100 MG tablet Take 1 tablet (100 mg total) by mouth daily for 10 days.   montelukast (SINGULAIR) 10 MG tablet Take 10 mg by mouth daily.   Multiple Vitamin (MULTIVITAMIN WITH MINERALS) TABS tablet Take 1 tablet by mouth daily.   omeprazole (PRILOSEC) 40 MG capsule Take 1 capsule (40 mg total)  by mouth daily.   Potassium 99 MG TABS Take by mouth daily at 6 (six) AM.   pregabalin (LYRICA) 150 MG capsule Take 1 capsule (150 mg total) by mouth 2 (two) times daily for 10 days.   Probiotic Product (PROBIOTIC-10 PO) Take by mouth.   RELION INSULIN SYRINGE 1ML/31G 31G X 5/16" 1 ML MISC    rosuvastatin (CRESTOR) 40 MG tablet Take 1 tablet (40 mg total) by mouth daily.   sertraline (ZOLOFT) 100 MG tablet Take 1.5  tablets (150 mg total) by mouth daily.   traZODone (DESYREL) 100 MG tablet Take 1 tablet (100 mg total) by mouth at bedtime as needed for sleep.   VASCEPA 1 g capsule Take 2 capsules (2 g total) by mouth 2 (two) times daily.   No facility-administered encounter medications on file as of 08/31/2022.    Care Gaps: PRXYV-85 Exam AWV   Star Rating Drugs: Metformin 500 mg last filled in 08/10/2022 for 30 day supply at [facility]. Rosuvastatin 20 mg last filled in 08/10/2022 for 30 day supply at [facility].  Called patient and discussed medication adherence  with patient, no issues at this time with current medication.   Patient Reports ED visit since her last CPP follow up.  Admitted to the hospital on 08/10/2022 due to Sepsis without acute organ dysfunction. Discharge date was 08/28/2022. Discharged from [place]?Medications Started at [facility] Discharge:?? -started amoxicillin 500 MG capsule 2 capsules by mouth every 8 hours for 13 days.   Medication Changes at Hospital Discharge: -Changed amlodipine to 10 mg daily   Medications Discontinued at Hospital Discharge: -Stopped bupropion due to risk of seizure.  -Stopped fluconazole -Stopped irbesartan-hydrochlorothiazide -Stopped ketoconazole  Medications that remain the same after Hospital Discharge:??  -All other medications will remain the same.    Patient Denies  any side effects with her medication.  Patient Denies  any problems with hercurrent pharmacy  Patient states she is unsure of how she is doing at the moment as she is currently in a rehab facility.Patient is requesting to move  to another one since it is 100 miles from her home. Patient states she will contact her PCP office to see if they can help her relocate to another rehab facility.Patient reports she is doing okay other then that.Patient denies any questions or concerns for the Clinical pharmacist at this time.  [place] Pharmacist Assistant [phone]

## 2022-08-31 NOTE — Telephone Encounter (Signed)
Copied from [place] [id number]. Topic: General - Inquiry >> Aug 31, 2022 10:27 AM Penni Bombard wrote: Reason for CRM: Pt called saying she is in a rehab from the hospital and it is [facility] and she wants to be transferred to a place closer to [place].  CB@  [phone] or [phone]

## 2022-08-31 NOTE — Telephone Encounter (Signed)
Called first number. No answer and mailbox full. Called second number which went to her friend- Darrell. He stated she's been in the hospital for 3 weeks. The hospital transferred her to a facility in [place] (didn't know name) and then went back to the hospital, but then went back to the rehab place again? I advised him she needs to speak to her physician who's putting in the rehab orders if they can locate a [place] facility. He gave verbal understanding.

## 2022-09-06 DIAGNOSIS — W19XXXA Unspecified fall, initial encounter: Secondary | ICD-10-CM | POA: Diagnosis not present

## 2022-09-06 DIAGNOSIS — S91102A Unspecified open wound of left great toe without damage to nail, initial encounter: Secondary | ICD-10-CM | POA: Diagnosis not present

## 2022-09-06 DIAGNOSIS — R52 Pain, unspecified: Secondary | ICD-10-CM | POA: Diagnosis not present

## 2022-09-06 DIAGNOSIS — E1140 Type 2 diabetes mellitus with diabetic neuropathy, unspecified: Secondary | ICD-10-CM | POA: Diagnosis not present

## 2022-09-06 DIAGNOSIS — E119 Type 2 diabetes mellitus without complications: Secondary | ICD-10-CM | POA: Diagnosis not present

## 2022-09-06 DIAGNOSIS — R54 Age-related physical debility: Secondary | ICD-10-CM | POA: Diagnosis not present

## 2022-09-06 DIAGNOSIS — R2681 Unsteadiness on feet: Secondary | ICD-10-CM | POA: Diagnosis not present

## 2022-09-06 LAB — ANAEROBIC CULTURE W GRAM STAIN: Gram Stain: NONE SEEN

## 2022-09-11 DIAGNOSIS — M6281 Muscle weakness (generalized): Secondary | ICD-10-CM | POA: Diagnosis not present

## 2022-09-11 DIAGNOSIS — B953 Streptococcus pneumoniae as the cause of diseases classified elsewhere: Secondary | ICD-10-CM | POA: Diagnosis not present

## 2022-09-11 DIAGNOSIS — A403 Sepsis due to Streptococcus pneumoniae: Secondary | ICD-10-CM | POA: Diagnosis not present

## 2022-09-11 DIAGNOSIS — I130 Hypertensive heart and chronic kidney disease with heart failure and stage 1 through stage 4 chronic kidney disease, or unspecified chronic kidney disease: Secondary | ICD-10-CM | POA: Diagnosis not present

## 2022-09-11 DIAGNOSIS — Z794 Long term (current) use of insulin: Secondary | ICD-10-CM | POA: Diagnosis not present

## 2022-09-11 DIAGNOSIS — D519 Vitamin B12 deficiency anemia, unspecified: Secondary | ICD-10-CM | POA: Diagnosis not present

## 2022-09-11 DIAGNOSIS — A4901 Methicillin susceptible Staphylococcus aureus infection, unspecified site: Secondary | ICD-10-CM | POA: Diagnosis not present

## 2022-09-11 DIAGNOSIS — I5030 Unspecified diastolic (congestive) heart failure: Secondary | ICD-10-CM | POA: Diagnosis not present

## 2022-09-11 DIAGNOSIS — R278 Other lack of coordination: Secondary | ICD-10-CM | POA: Diagnosis not present

## 2022-09-11 DIAGNOSIS — R2681 Unsteadiness on feet: Secondary | ICD-10-CM | POA: Diagnosis not present

## 2022-09-11 DIAGNOSIS — M14672 Charcot's joint, left ankle and foot: Secondary | ICD-10-CM | POA: Diagnosis not present

## 2022-09-11 DIAGNOSIS — L97529 Non-pressure chronic ulcer of other part of left foot with unspecified severity: Secondary | ICD-10-CM | POA: Diagnosis not present

## 2022-09-11 DIAGNOSIS — M6250 Muscle wasting and atrophy, not elsewhere classified, unspecified site: Secondary | ICD-10-CM | POA: Diagnosis not present

## 2022-09-11 DIAGNOSIS — N1832 Chronic kidney disease, stage 3b: Secondary | ICD-10-CM | POA: Diagnosis not present

## 2022-09-11 DIAGNOSIS — E1165 Type 2 diabetes mellitus with hyperglycemia: Secondary | ICD-10-CM | POA: Diagnosis not present

## 2022-09-11 DIAGNOSIS — E559 Vitamin D deficiency, unspecified: Secondary | ICD-10-CM | POA: Diagnosis not present

## 2022-09-11 DIAGNOSIS — R7881 Bacteremia: Secondary | ICD-10-CM | POA: Diagnosis not present

## 2022-09-11 DIAGNOSIS — E11621 Type 2 diabetes mellitus with foot ulcer: Secondary | ICD-10-CM | POA: Diagnosis not present

## 2022-09-11 DIAGNOSIS — E1140 Type 2 diabetes mellitus with diabetic neuropathy, unspecified: Secondary | ICD-10-CM | POA: Diagnosis not present

## 2022-09-11 DIAGNOSIS — M00862 Arthritis due to other bacteria, left knee: Secondary | ICD-10-CM | POA: Diagnosis not present

## 2022-09-11 DIAGNOSIS — Z6841 Body Mass Index (BMI) 40.0 and over, adult: Secondary | ICD-10-CM | POA: Diagnosis not present

## 2022-09-11 DIAGNOSIS — U071 COVID-19: Secondary | ICD-10-CM | POA: Diagnosis not present

## 2022-09-11 DIAGNOSIS — R1311 Dysphagia, oral phase: Secondary | ICD-10-CM | POA: Diagnosis not present

## 2022-09-11 DIAGNOSIS — E785 Hyperlipidemia, unspecified: Secondary | ICD-10-CM | POA: Diagnosis not present

## 2022-09-11 DIAGNOSIS — J45909 Unspecified asthma, uncomplicated: Secondary | ICD-10-CM | POA: Diagnosis not present

## 2022-09-12 DIAGNOSIS — E119 Type 2 diabetes mellitus without complications: Secondary | ICD-10-CM | POA: Diagnosis not present

## 2022-09-12 DIAGNOSIS — R051 Acute cough: Secondary | ICD-10-CM | POA: Diagnosis not present

## 2022-09-12 DIAGNOSIS — I10 Essential (primary) hypertension: Secondary | ICD-10-CM | POA: Diagnosis not present

## 2022-09-13 DIAGNOSIS — S91102A Unspecified open wound of left great toe without damage to nail, initial encounter: Secondary | ICD-10-CM | POA: Diagnosis not present

## 2022-09-13 DIAGNOSIS — R2681 Unsteadiness on feet: Secondary | ICD-10-CM | POA: Diagnosis not present

## 2022-09-13 DIAGNOSIS — R52 Pain, unspecified: Secondary | ICD-10-CM | POA: Diagnosis not present

## 2022-09-13 DIAGNOSIS — W19XXXA Unspecified fall, initial encounter: Secondary | ICD-10-CM | POA: Diagnosis not present

## 2022-09-13 DIAGNOSIS — R54 Age-related physical debility: Secondary | ICD-10-CM | POA: Diagnosis not present

## 2022-09-14 DIAGNOSIS — I509 Heart failure, unspecified: Secondary | ICD-10-CM | POA: Diagnosis not present

## 2022-09-14 DIAGNOSIS — R059 Cough, unspecified: Secondary | ICD-10-CM | POA: Diagnosis not present

## 2022-09-14 DIAGNOSIS — E119 Type 2 diabetes mellitus without complications: Secondary | ICD-10-CM | POA: Diagnosis not present

## 2022-09-18 ENCOUNTER — Other Ambulatory Visit: Payer: Self-pay

## 2022-09-18 DIAGNOSIS — R52 Pain, unspecified: Secondary | ICD-10-CM | POA: Diagnosis not present

## 2022-09-18 DIAGNOSIS — R2681 Unsteadiness on feet: Secondary | ICD-10-CM | POA: Diagnosis not present

## 2022-09-18 DIAGNOSIS — S91102A Unspecified open wound of left great toe without damage to nail, initial encounter: Secondary | ICD-10-CM | POA: Diagnosis not present

## 2022-09-18 DIAGNOSIS — W19XXXA Unspecified fall, initial encounter: Secondary | ICD-10-CM | POA: Diagnosis not present

## 2022-09-18 DIAGNOSIS — M109 Gout, unspecified: Secondary | ICD-10-CM | POA: Diagnosis not present

## 2022-09-18 DIAGNOSIS — R54 Age-related physical debility: Secondary | ICD-10-CM | POA: Diagnosis not present

## 2022-09-18 MED ORDER — MONTELUKAST SODIUM 10 MG PO TABS: 10.0000 mg | ORAL_TABLET | Freq: Every day | ORAL | 0 refills | Status: DC

## 2022-09-20 ENCOUNTER — Other Ambulatory Visit: Payer: Self-pay | Admitting: Family Medicine

## 2022-09-21 ENCOUNTER — Other Ambulatory Visit: Payer: Self-pay

## 2022-09-21 DIAGNOSIS — Z7401 Bed confinement status: Secondary | ICD-10-CM | POA: Diagnosis not present

## 2022-09-21 DIAGNOSIS — R531 Weakness: Secondary | ICD-10-CM | POA: Diagnosis not present

## 2022-09-21 DIAGNOSIS — R197 Diarrhea, unspecified: Secondary | ICD-10-CM | POA: Diagnosis not present

## 2022-09-21 DIAGNOSIS — I10 Essential (primary) hypertension: Secondary | ICD-10-CM | POA: Diagnosis not present

## 2022-09-21 DIAGNOSIS — R1084 Generalized abdominal pain: Secondary | ICD-10-CM | POA: Diagnosis not present

## 2022-09-21 DIAGNOSIS — I700 Atherosclerosis of aorta: Secondary | ICD-10-CM | POA: Diagnosis not present

## 2022-09-21 DIAGNOSIS — N189 Chronic kidney disease, unspecified: Secondary | ICD-10-CM | POA: Diagnosis not present

## 2022-09-21 DIAGNOSIS — R739 Hyperglycemia, unspecified: Secondary | ICD-10-CM | POA: Diagnosis not present

## 2022-09-21 DIAGNOSIS — R109 Unspecified abdominal pain: Secondary | ICD-10-CM | POA: Diagnosis not present

## 2022-09-21 DIAGNOSIS — D509 Iron deficiency anemia, unspecified: Secondary | ICD-10-CM | POA: Diagnosis not present

## 2022-09-21 DIAGNOSIS — K449 Diaphragmatic hernia without obstruction or gangrene: Secondary | ICD-10-CM | POA: Diagnosis not present

## 2022-09-26 DIAGNOSIS — R109 Unspecified abdominal pain: Secondary | ICD-10-CM | POA: Diagnosis not present

## 2022-09-26 DIAGNOSIS — U071 COVID-19: Secondary | ICD-10-CM | POA: Diagnosis not present

## 2022-09-27 DIAGNOSIS — R569 Unspecified convulsions: Secondary | ICD-10-CM | POA: Diagnosis not present

## 2022-09-27 DIAGNOSIS — I10 Essential (primary) hypertension: Secondary | ICD-10-CM | POA: Diagnosis not present

## 2022-09-27 DIAGNOSIS — I509 Heart failure, unspecified: Secondary | ICD-10-CM | POA: Diagnosis not present

## 2022-09-27 DIAGNOSIS — E039 Hypothyroidism, unspecified: Secondary | ICD-10-CM | POA: Diagnosis not present

## 2022-09-27 DIAGNOSIS — U071 COVID-19: Secondary | ICD-10-CM | POA: Diagnosis not present

## 2022-09-27 DIAGNOSIS — N179 Acute kidney failure, unspecified: Secondary | ICD-10-CM | POA: Diagnosis not present

## 2022-09-28 DIAGNOSIS — U071 COVID-19: Secondary | ICD-10-CM | POA: Diagnosis not present

## 2022-09-30 DIAGNOSIS — I10 Essential (primary) hypertension: Secondary | ICD-10-CM | POA: Diagnosis not present

## 2022-10-02 DIAGNOSIS — U071 COVID-19: Secondary | ICD-10-CM | POA: Diagnosis not present

## 2022-10-04 ENCOUNTER — Ambulatory Visit: Payer: No Typology Code available for payment source | Admitting: Family Medicine

## 2022-10-06 DIAGNOSIS — U071 COVID-19: Secondary | ICD-10-CM | POA: Diagnosis not present

## 2022-10-09 DIAGNOSIS — R400 Somnolence: Secondary | ICD-10-CM | POA: Diagnosis not present

## 2022-10-09 DIAGNOSIS — U071 COVID-19: Secondary | ICD-10-CM | POA: Diagnosis not present

## 2022-10-09 DIAGNOSIS — G629 Polyneuropathy, unspecified: Secondary | ICD-10-CM | POA: Diagnosis not present

## 2022-10-16 ENCOUNTER — Telehealth: Payer: Self-pay | Admitting: Podiatry

## 2022-10-16 NOTE — Telephone Encounter (Signed)
Patient called wanted to send boyfriend to ;pick up her diabetic shoes, I told her we could not do that she has to try them on and sign off on paperwork. She is going to be in rehab facility for several more months she will call back when she is out to start the process over

## 2022-10-17 ENCOUNTER — Telehealth: Payer: Self-pay

## 2022-10-17 DIAGNOSIS — R400 Somnolence: Secondary | ICD-10-CM | POA: Diagnosis not present

## 2022-10-17 DIAGNOSIS — G629 Polyneuropathy, unspecified: Secondary | ICD-10-CM | POA: Diagnosis not present

## 2022-10-17 NOTE — Progress Notes (Signed)
Care Management & Coordination Services Pharmacy Team  Reason for Encounter: Diabetes  Contacted patient to discuss diabetes disease state. {US HC Outreach:28874}  Current antihyperglycemic regimen:  Farxiga 10 mg daily  NPH 70/30 100 units with supper  Metformin 500 mg daily    Patient verbally confirms she is taking the above medications as directed. {yes/no:20286}  What diet changes have been made to improve diabetes control?  What recent interventions/DTPs have been made to improve glycemic control:  ***  Have there been any recent hospitalizations or ED visits since last visit with PharmD? Yes  Patient {reports/denies:24182} hypoglycemic symptoms, including {Hypoglycemic Symptoms:3049003}  Patient {reports/denies:24182} hyperglycemic symptoms, including {symptoms; hyperglycemia:17903}  How often are you checking your blood sugar? {BG Testing frequency:23922}  What are your blood sugars ranging?  Fasting: *** Before meals: *** After meals: *** Bedtime: ***  During the week, how often does your blood glucose drop below 70? {LowBGfrequency:24142}  Are you checking your feet daily/regularly? {yes/no:20286}  Adherence Review: Is the patient currently on a STATIN medication? Yes Is the patient currently on ACE/ARB medication? No Does the patient have >5 day gap between last estimated fill dates? No   Chart Updates:  Recent office visits:  None ID  Recent consult visits:  None ID  Hospital visits:  Medication Reconciliation was completed by comparing discharge summary, patient's EMR and Pharmacy list, and upon discussion with patient.  Admitted to the hospital on 09/21/2022 due to Right Flank pain. Discharge date was 09/21/2022. Discharged from [place]?Medications Started at [facility] Discharge:?? -started naproxen (NAPROSYN) 500 MG tablet  -started HYDROcodone-acetaminophen (NORCO) 5-325 mg per tablet    Medication Changes at Hospital  Discharge: -Changed None ID  Medications Discontinued at Hospital Discharge: -Stopped None ID  Medications that remain the same after Hospital Discharge:??  -All other medications will remain the same.    Medications: Outpatient Encounter Medications as of 10/17/2022  Medication Sig   ACCU-CHEK SOFTCLIX LANCETS lancets    acetaminophen (TYLENOL 8 HOUR) 650 MG CR tablet Take 1 tablet (650 mg total) by mouth every 8 (eight) hours as needed for pain.   albuterol (VENTOLIN HFA) 108 (90 Base) MCG/ACT inhaler Inhale 2 puffs into the lungs every 6 (six) hours as needed for wheezing or shortness of breath.   Alcohol Swabs (B-D SINGLE USE SWABS REGULAR) PADS    allopurinol (ZYLOPRIM) 100 MG tablet Take 0.5 tablets (50 mg total) by mouth daily.   amLODipine (NORVASC) 10 MG tablet Take 1 tablet (10 mg total) by mouth daily.   ASPIRIN LOW DOSE 81 MG EC tablet TAKE 1 TABLET (81 MG TOTAL) BY MOUTH DAILY.   Blood Glucose Monitoring Suppl (ACCU-CHEK AVIVA PLUS) w/Device KIT    Budeson-Glycopyrrol-Formoterol (BREZTRI AEROSPHERE) 160-9-4.8 MCG/ACT AERO Inhale 2 puffs into the lungs in the morning and at bedtime.   Cholecalciferol (VITAMIN D3) 25 MCG (1000 UT) CAPS Take 1,000 Units by mouth.   Continuous Blood Gluc Sensor (FREESTYLE LIBRE 2 SENSOR) MISC FOR GLUCOSE MONITORING   Cyanocobalamin (VITAMIN B12 PO) Take 1 tablet by mouth daily.    ezetimibe (ZETIA) 10 MG tablet Take 1 tablet (10 mg total) by mouth daily.   feeding supplement (ENSURE ENLIVE / ENSURE PLUS) LIQD Take 237 mLs by mouth 3 (three) times daily.   furosemide (LASIX) 20 MG tablet Take 1 tablet (20 mg total) by mouth daily.   insulin NPH-regular Human (70-30) 100 UNIT/ML injection Inject 45 units after breakfast and dinner if eating >  50% of meals   levothyroxine (SYNTHROID) 25 MCG tablet Take 1 tablet (25 mcg total) by mouth daily before breakfast. Two on Sunday   metFORMIN (GLUCOPHAGE) 500 MG tablet Take 2 tablets by mouth daily with  breakfast.   metoprolol tartrate (LOPRESSOR) 50 MG tablet Take 1 tablet (50 mg total) by mouth 2 (two) times daily.   Misc Natural Products (OSTEO BI-FLEX ADV JOINT SHIELD PO) Take by mouth 2 (two) times daily.    modafinil (PROVIGIL) 100 MG tablet Take 1 tablet (100 mg total) by mouth daily for 10 days.   montelukast (SINGULAIR) 10 MG tablet Take 1 tablet (10 mg total) by mouth daily.   Multiple Vitamin (MULTIVITAMIN WITH MINERALS) TABS tablet Take 1 tablet by mouth daily.   omeprazole (PRILOSEC) 40 MG capsule Take 1 capsule (40 mg total) by mouth daily.   Potassium 99 MG TABS Take by mouth daily at 6 (six) AM.   pregabalin (LYRICA) 150 MG capsule Take 1 capsule (150 mg total) by mouth 2 (two) times daily for 10 days.   Probiotic Product (PROBIOTIC-10 PO) Take by mouth.   RELION INSULIN SYRINGE 1ML/31G 31G X 5/16" 1 ML MISC    rosuvastatin (CRESTOR) 40 MG tablet Take 1 tablet (40 mg total) by mouth daily.   sertraline (ZOLOFT) 100 MG tablet Take 1.5 tablets (150 mg total) by mouth daily.   traZODone (DESYREL) 100 MG tablet Take 1 tablet (100 mg total) by mouth at bedtime as needed for sleep.   VASCEPA 1 g capsule Take 2 capsules (2 g total) by mouth 2 (two) times daily.   No facility-administered encounter medications on file as of 10/17/2022.    Recent Relevant Labs: Lab Results  Component Value Date/Time   HGBA1C 7.5 06/06/2022 12:00 AM   HGBA1C 7.6 12/29/2021 12:00 AM   MICROALBUR 0.6 01/28/2018 03:52 PM   MICROALBUR 20 02/19/2017 10:40 AM   MICROALBUR 20 07/22/2015 02:05 PM    Kidney Function Lab Results  Component Value Date/Time   CREATININE 1.23 (H) 08/28/2022 04:55 AM   CREATININE 1.13 (H) 08/27/2022 05:20 AM   CREATININE 1.33 (H) 01/28/2021 03:00 PM   CREATININE 1.42 (H) 10/14/2019 11:42 AM   GFRNONAA 46 (L) 08/28/2022 04:55 AM   GFRNONAA 40 (L) 01/28/2021 03:00 PM   GFRAA 46 (L) 01/28/2021 03:00 PM    Star Rating Drugs:  Medication:Metformin 500 mg Last Fill:  10/02/2022 11 Day Supply at [facility] Medication:Rosuvastatin 20 mg  Last Fill: 09/09/2022 30 Day Supply at [facility]: Annual wellness visit in last year? No, last completed 08/30/2021 Last eye exam / retinopathy screening:last completed 07/19/2022 Last diabetic foot exam:last completed 08/11/2022 Shingrix Vaccine COVID-19 Vaccine  [place] Clinical Pharmacist Assistant [phone]

## 2022-10-23 DIAGNOSIS — I509 Heart failure, unspecified: Secondary | ICD-10-CM | POA: Diagnosis not present

## 2022-10-23 DIAGNOSIS — J449 Chronic obstructive pulmonary disease, unspecified: Secondary | ICD-10-CM | POA: Diagnosis not present

## 2022-10-23 DIAGNOSIS — I10 Essential (primary) hypertension: Secondary | ICD-10-CM | POA: Diagnosis not present

## 2022-10-25 DIAGNOSIS — M549 Dorsalgia, unspecified: Secondary | ICD-10-CM | POA: Diagnosis not present

## 2022-10-25 DIAGNOSIS — E119 Type 2 diabetes mellitus without complications: Secondary | ICD-10-CM | POA: Diagnosis not present

## 2022-10-25 DIAGNOSIS — J449 Chronic obstructive pulmonary disease, unspecified: Secondary | ICD-10-CM | POA: Diagnosis not present

## 2022-10-25 DIAGNOSIS — R569 Unspecified convulsions: Secondary | ICD-10-CM | POA: Diagnosis not present

## 2022-10-25 DIAGNOSIS — I10 Essential (primary) hypertension: Secondary | ICD-10-CM | POA: Diagnosis not present

## 2022-10-25 DIAGNOSIS — I509 Heart failure, unspecified: Secondary | ICD-10-CM | POA: Diagnosis not present

## 2022-11-07 DIAGNOSIS — E119 Type 2 diabetes mellitus without complications: Secondary | ICD-10-CM | POA: Diagnosis not present

## 2022-11-07 DIAGNOSIS — J449 Chronic obstructive pulmonary disease, unspecified: Secondary | ICD-10-CM | POA: Diagnosis not present

## 2022-11-07 DIAGNOSIS — R569 Unspecified convulsions: Secondary | ICD-10-CM | POA: Diagnosis not present

## 2022-11-09 DIAGNOSIS — G629 Polyneuropathy, unspecified: Secondary | ICD-10-CM | POA: Diagnosis not present

## 2022-11-16 DIAGNOSIS — J449 Chronic obstructive pulmonary disease, unspecified: Secondary | ICD-10-CM | POA: Diagnosis not present

## 2022-11-16 DIAGNOSIS — I509 Heart failure, unspecified: Secondary | ICD-10-CM | POA: Diagnosis not present

## 2022-11-24 DIAGNOSIS — E119 Type 2 diabetes mellitus without complications: Secondary | ICD-10-CM | POA: Diagnosis not present

## 2022-11-24 DIAGNOSIS — I10 Essential (primary) hypertension: Secondary | ICD-10-CM | POA: Diagnosis not present

## 2022-11-27 ENCOUNTER — Other Ambulatory Visit (INDEPENDENT_AMBULATORY_CARE_PROVIDER_SITE_OTHER): Payer: Self-pay | Admitting: Vascular Surgery

## 2022-11-27 DIAGNOSIS — I7025 Atherosclerosis of native arteries of other extremities with ulceration: Secondary | ICD-10-CM

## 2022-11-28 DIAGNOSIS — I509 Heart failure, unspecified: Secondary | ICD-10-CM | POA: Diagnosis not present

## 2022-11-28 DIAGNOSIS — F32A Depression, unspecified: Secondary | ICD-10-CM | POA: Diagnosis not present

## 2022-11-28 DIAGNOSIS — E119 Type 2 diabetes mellitus without complications: Secondary | ICD-10-CM | POA: Diagnosis not present

## 2022-11-28 DIAGNOSIS — J449 Chronic obstructive pulmonary disease, unspecified: Secondary | ICD-10-CM | POA: Diagnosis not present

## 2022-11-29 DIAGNOSIS — R52 Pain, unspecified: Secondary | ICD-10-CM | POA: Diagnosis not present

## 2022-11-29 DIAGNOSIS — S91102A Unspecified open wound of left great toe without damage to nail, initial encounter: Secondary | ICD-10-CM | POA: Diagnosis not present

## 2022-11-29 DIAGNOSIS — R531 Weakness: Secondary | ICD-10-CM | POA: Diagnosis not present

## 2022-11-29 DIAGNOSIS — R2681 Unsteadiness on feet: Secondary | ICD-10-CM | POA: Diagnosis not present

## 2022-11-29 DIAGNOSIS — U071 COVID-19: Secondary | ICD-10-CM | POA: Diagnosis not present

## 2022-11-29 DIAGNOSIS — W19XXXA Unspecified fall, initial encounter: Secondary | ICD-10-CM | POA: Diagnosis not present

## 2022-11-29 DIAGNOSIS — R54 Age-related physical debility: Secondary | ICD-10-CM | POA: Diagnosis not present

## 2022-11-30 DIAGNOSIS — E119 Type 2 diabetes mellitus without complications: Secondary | ICD-10-CM | POA: Diagnosis not present

## 2022-11-30 DIAGNOSIS — E559 Vitamin D deficiency, unspecified: Secondary | ICD-10-CM | POA: Diagnosis not present

## 2022-12-01 DIAGNOSIS — E538 Deficiency of other specified B group vitamins: Secondary | ICD-10-CM | POA: Diagnosis not present

## 2022-12-01 DIAGNOSIS — N189 Chronic kidney disease, unspecified: Secondary | ICD-10-CM | POA: Diagnosis not present

## 2022-12-01 DIAGNOSIS — E785 Hyperlipidemia, unspecified: Secondary | ICD-10-CM | POA: Diagnosis not present

## 2022-12-01 DIAGNOSIS — E119 Type 2 diabetes mellitus without complications: Secondary | ICD-10-CM | POA: Diagnosis not present

## 2022-12-01 DIAGNOSIS — D649 Anemia, unspecified: Secondary | ICD-10-CM | POA: Diagnosis not present

## 2022-12-04 ENCOUNTER — Encounter (INDEPENDENT_AMBULATORY_CARE_PROVIDER_SITE_OTHER): Payer: No Typology Code available for payment source

## 2022-12-04 ENCOUNTER — Ambulatory Visit (INDEPENDENT_AMBULATORY_CARE_PROVIDER_SITE_OTHER): Payer: No Typology Code available for payment source | Admitting: Vascular Surgery

## 2022-12-04 DIAGNOSIS — R2681 Unsteadiness on feet: Secondary | ICD-10-CM | POA: Diagnosis not present

## 2022-12-04 DIAGNOSIS — S91102A Unspecified open wound of left great toe without damage to nail, initial encounter: Secondary | ICD-10-CM | POA: Diagnosis not present

## 2022-12-04 DIAGNOSIS — U071 COVID-19: Secondary | ICD-10-CM | POA: Diagnosis not present

## 2022-12-04 DIAGNOSIS — R531 Weakness: Secondary | ICD-10-CM | POA: Diagnosis not present

## 2022-12-04 DIAGNOSIS — R54 Age-related physical debility: Secondary | ICD-10-CM | POA: Diagnosis not present

## 2022-12-04 DIAGNOSIS — W19XXXA Unspecified fall, initial encounter: Secondary | ICD-10-CM | POA: Diagnosis not present

## 2022-12-04 DIAGNOSIS — R52 Pain, unspecified: Secondary | ICD-10-CM | POA: Diagnosis not present

## 2022-12-08 DIAGNOSIS — R5381 Other malaise: Secondary | ICD-10-CM | POA: Diagnosis not present

## 2022-12-08 DIAGNOSIS — G629 Polyneuropathy, unspecified: Secondary | ICD-10-CM | POA: Diagnosis not present

## 2022-12-08 DIAGNOSIS — J449 Chronic obstructive pulmonary disease, unspecified: Secondary | ICD-10-CM | POA: Diagnosis not present

## 2022-12-11 DIAGNOSIS — S91102A Unspecified open wound of left great toe without damage to nail, initial encounter: Secondary | ICD-10-CM | POA: Diagnosis not present

## 2022-12-11 DIAGNOSIS — R52 Pain, unspecified: Secondary | ICD-10-CM | POA: Diagnosis not present

## 2022-12-11 DIAGNOSIS — U071 COVID-19: Secondary | ICD-10-CM | POA: Diagnosis not present

## 2022-12-11 DIAGNOSIS — W19XXXA Unspecified fall, initial encounter: Secondary | ICD-10-CM | POA: Diagnosis not present

## 2022-12-11 DIAGNOSIS — R54 Age-related physical debility: Secondary | ICD-10-CM | POA: Diagnosis not present

## 2022-12-11 DIAGNOSIS — R531 Weakness: Secondary | ICD-10-CM | POA: Diagnosis not present

## 2022-12-11 DIAGNOSIS — R2681 Unsteadiness on feet: Secondary | ICD-10-CM | POA: Diagnosis not present

## 2022-12-13 ENCOUNTER — Telehealth: Payer: Self-pay

## 2022-12-13 NOTE — Progress Notes (Signed)
Care Management & Coordination Services Pharmacy Team  Reason for Encounter: Diabetes  Contacted patient to discuss diabetes disease state. Unsuccessful outreach. Unable to leave voicemail.  Current antihyperglycemic regimen:  Farxiga 10 mg daily  NPH 70/30 100 units with supper  Metformin 500 mg daily      What recent interventions/DTPs have been made to improve glycemic control:  None ID  Have there been any recent hospitalizations or ED visits since last visit with PharmD? No   Adherence Review: Is the patient currently on a STATIN medication? Yes Is the patient currently on ACE/ARB medication? No Does the patient have >5 day gap between last estimated fill dates? Yes  Star Rating Drugs:  Medication:Metformin 500 mg Last Fill: 10/02/2022 11 Day Supply at [facility] Medication:Rosuvastatin 20 mg  Last Fill: 09/09/2022 30 Day Supply at [facility] Gaps: Annual wellness visit in last year? No, last completed 08/30/2021 Last eye exam / retinopathy screening:last completed 07/19/2022 Last diabetic foot exam:last completed 08/11/2022 Shingrix Vaccine COVID-19 Vaccine Mammogram Hemoglobin A1C  Chart Updates:  Recent office visits:  None ID  Recent consult visits:  None ID  Hospital visits:  None in previous 6 months  Medications: Outpatient Encounter Medications as of 12/13/2022  Medication Sig   ACCU-CHEK SOFTCLIX LANCETS lancets    acetaminophen (TYLENOL 8 HOUR) 650 MG CR tablet Take 1 tablet (650 mg total) by mouth every 8 (eight) hours as needed for pain.   albuterol (VENTOLIN HFA) 108 (90 Base) MCG/ACT inhaler Inhale 2 puffs into the lungs every 6 (six) hours as needed for wheezing or shortness of breath.   Alcohol Swabs (B-D SINGLE USE SWABS REGULAR) PADS    allopurinol (ZYLOPRIM) 100 MG tablet Take 0.5 tablets (50 mg total) by mouth daily.   amLODipine (NORVASC) 10 MG tablet Take 1 tablet (10 mg total) by mouth daily.   ASPIRIN LOW  DOSE 81 MG EC tablet TAKE 1 TABLET (81 MG TOTAL) BY MOUTH DAILY.   Blood Glucose Monitoring Suppl (ACCU-CHEK AVIVA PLUS) w/Device KIT    Budeson-Glycopyrrol-Formoterol (BREZTRI AEROSPHERE) 160-9-4.8 MCG/ACT AERO Inhale 2 puffs into the lungs in the morning and at bedtime.   Cholecalciferol (VITAMIN D3) 25 MCG (1000 UT) CAPS Take 1,000 Units by mouth.   Continuous Blood Gluc Sensor (FREESTYLE LIBRE 2 SENSOR) MISC FOR GLUCOSE MONITORING   Cyanocobalamin (VITAMIN B12 PO) Take 1 tablet by mouth daily.    ezetimibe (ZETIA) 10 MG tablet Take 1 tablet (10 mg total) by mouth daily.   feeding supplement (ENSURE ENLIVE / ENSURE PLUS) LIQD Take 237 mLs by mouth 3 (three) times daily.   furosemide (LASIX) 20 MG tablet Take 1 tablet (20 mg total) by mouth daily.   insulin NPH-regular Human (70-30) 100 UNIT/ML injection Inject 45 units after breakfast and dinner if eating > 50% of meals   levothyroxine (SYNTHROID) 25 MCG tablet Take 1 tablet (25 mcg total) by mouth daily before breakfast. Two on Sunday   metFORMIN (GLUCOPHAGE) 500 MG tablet Take 2 tablets by mouth daily with breakfast.   metoprolol tartrate (LOPRESSOR) 50 MG tablet Take 1 tablet (50 mg total) by mouth 2 (two) times daily.   Misc Natural Products (OSTEO BI-FLEX ADV JOINT SHIELD PO) Take by mouth 2 (two) times daily.    modafinil (PROVIGIL) 100 MG tablet Take 1 tablet (100 mg total) by mouth daily for 10 days.   montelukast (SINGULAIR) 10 MG tablet Take 1 tablet (10 mg total) by mouth daily.  Multiple Vitamin (MULTIVITAMIN WITH MINERALS) TABS tablet Take 1 tablet by mouth daily.   omeprazole (PRILOSEC) 40 MG capsule Take 1 capsule (40 mg total) by mouth daily.   Potassium 99 MG TABS Take by mouth daily at 6 (six) AM.   pregabalin (LYRICA) 150 MG capsule Take 1 capsule (150 mg total) by mouth 2 (two) times daily for 10 days.   Probiotic Product (PROBIOTIC-10 PO) Take by mouth.   RELION INSULIN SYRINGE 1ML/31G 31G X 5/16" 1 ML MISC     rosuvastatin (CRESTOR) 40 MG tablet Take 1 tablet (40 mg total) by mouth daily.   sertraline (ZOLOFT) 100 MG tablet Take 1.5 tablets (150 mg total) by mouth daily.   traZODone (DESYREL) 100 MG tablet Take 1 tablet (100 mg total) by mouth at bedtime as needed for sleep.   VASCEPA 1 g capsule Take 2 capsules (2 g total) by mouth 2 (two) times daily.   No facility-administered encounter medications on file as of 12/13/2022.    Recent Relevant Labs: Lab Results  Component Value Date/Time   HGBA1C 7.5 06/06/2022 12:00 AM   HGBA1C 7.6 12/29/2021 12:00 AM   MICROALBUR 0.6 01/28/2018 03:52 PM   MICROALBUR 20 02/19/2017 10:40 AM   MICROALBUR 20 07/22/2015 02:05 PM    Kidney Function Lab Results  Component Value Date/Time   CREATININE 1.23 (H) 08/28/2022 04:55 AM   CREATININE 1.13 (H) 08/27/2022 05:20 AM   CREATININE 1.33 (H) 01/28/2021 03:00 PM   CREATININE 1.42 (H) 10/14/2019 11:42 AM   GFRNONAA 46 (L) 08/28/2022 04:55 AM   GFRNONAA 40 (L) 01/28/2021 03:00 PM   GFRAA 46 (L) 01/28/2021 03:00 PM    Bess[facility] Clinical Pharmacist Assistant [phone]

## 2022-12-18 DIAGNOSIS — R531 Weakness: Secondary | ICD-10-CM | POA: Diagnosis not present

## 2022-12-18 DIAGNOSIS — U071 COVID-19: Secondary | ICD-10-CM | POA: Diagnosis not present

## 2022-12-18 DIAGNOSIS — R52 Pain, unspecified: Secondary | ICD-10-CM | POA: Diagnosis not present

## 2022-12-18 DIAGNOSIS — R54 Age-related physical debility: Secondary | ICD-10-CM | POA: Diagnosis not present

## 2022-12-18 DIAGNOSIS — S91102A Unspecified open wound of left great toe without damage to nail, initial encounter: Secondary | ICD-10-CM | POA: Diagnosis not present

## 2022-12-18 DIAGNOSIS — W19XXXA Unspecified fall, initial encounter: Secondary | ICD-10-CM | POA: Diagnosis not present

## 2022-12-18 DIAGNOSIS — R2681 Unsteadiness on feet: Secondary | ICD-10-CM | POA: Diagnosis not present

## 2022-12-22 DIAGNOSIS — M109 Gout, unspecified: Secondary | ICD-10-CM | POA: Diagnosis not present

## 2022-12-22 DIAGNOSIS — I509 Heart failure, unspecified: Secondary | ICD-10-CM | POA: Diagnosis not present

## 2022-12-26 DIAGNOSIS — I10 Essential (primary) hypertension: Secondary | ICD-10-CM | POA: Diagnosis not present

## 2022-12-28 DIAGNOSIS — I509 Heart failure, unspecified: Secondary | ICD-10-CM | POA: Diagnosis not present

## 2022-12-28 DIAGNOSIS — I130 Hypertensive heart and chronic kidney disease with heart failure and stage 1 through stage 4 chronic kidney disease, or unspecified chronic kidney disease: Secondary | ICD-10-CM | POA: Diagnosis not present

## 2022-12-28 DIAGNOSIS — N189 Chronic kidney disease, unspecified: Secondary | ICD-10-CM | POA: Diagnosis not present

## 2023-01-03 DIAGNOSIS — J449 Chronic obstructive pulmonary disease, unspecified: Secondary | ICD-10-CM | POA: Diagnosis not present

## 2023-01-03 DIAGNOSIS — N189 Chronic kidney disease, unspecified: Secondary | ICD-10-CM | POA: Diagnosis not present

## 2023-01-03 DIAGNOSIS — I509 Heart failure, unspecified: Secondary | ICD-10-CM | POA: Diagnosis not present

## 2023-01-03 DIAGNOSIS — I10 Essential (primary) hypertension: Secondary | ICD-10-CM | POA: Diagnosis not present

## 2023-01-05 DIAGNOSIS — E785 Hyperlipidemia, unspecified: Secondary | ICD-10-CM | POA: Diagnosis not present

## 2023-01-05 DIAGNOSIS — J449 Chronic obstructive pulmonary disease, unspecified: Secondary | ICD-10-CM | POA: Diagnosis not present

## 2023-01-05 DIAGNOSIS — I10 Essential (primary) hypertension: Secondary | ICD-10-CM | POA: Diagnosis not present

## 2023-01-05 DIAGNOSIS — M109 Gout, unspecified: Secondary | ICD-10-CM | POA: Diagnosis not present

## 2023-01-05 DIAGNOSIS — G629 Polyneuropathy, unspecified: Secondary | ICD-10-CM | POA: Diagnosis not present

## 2023-01-05 DIAGNOSIS — E119 Type 2 diabetes mellitus without complications: Secondary | ICD-10-CM | POA: Diagnosis not present

## 2023-01-05 DIAGNOSIS — I509 Heart failure, unspecified: Secondary | ICD-10-CM | POA: Diagnosis not present

## 2023-01-05 DIAGNOSIS — F32A Depression, unspecified: Secondary | ICD-10-CM | POA: Diagnosis not present

## 2023-01-08 ENCOUNTER — Telehealth: Payer: Self-pay | Admitting: Family Medicine

## 2023-01-08 NOTE — Telephone Encounter (Signed)
Charlene calling from [facility] is calling to report that the patient will be discharge returning home today 01/08/23 looking to schedule a follow up. No available appt until May 14Please advise with the patient & Charlene CB- [phone]

## 2023-01-09 DIAGNOSIS — M48061 Spinal stenosis, lumbar region without neurogenic claudication: Secondary | ICD-10-CM | POA: Diagnosis not present

## 2023-01-09 DIAGNOSIS — G5601 Carpal tunnel syndrome, right upper limb: Secondary | ICD-10-CM | POA: Diagnosis not present

## 2023-01-09 DIAGNOSIS — F325 Major depressive disorder, single episode, in full remission: Secondary | ICD-10-CM | POA: Diagnosis not present

## 2023-01-09 DIAGNOSIS — D509 Iron deficiency anemia, unspecified: Secondary | ICD-10-CM | POA: Diagnosis not present

## 2023-01-09 DIAGNOSIS — M4326 Fusion of spine, lumbar region: Secondary | ICD-10-CM | POA: Diagnosis not present

## 2023-01-09 DIAGNOSIS — F341 Dysthymic disorder: Secondary | ICD-10-CM | POA: Diagnosis not present

## 2023-01-09 DIAGNOSIS — M961 Postlaminectomy syndrome, not elsewhere classified: Secondary | ICD-10-CM | POA: Diagnosis not present

## 2023-01-09 DIAGNOSIS — I5020 Unspecified systolic (congestive) heart failure: Secondary | ICD-10-CM | POA: Diagnosis not present

## 2023-01-12 NOTE — Progress Notes (Unsigned)
Name: Summer [name]   MRN: [id number]    DOB: [birth year]   Date:01/15/2023       Progress Note  Subjective  Chief Complaint  Follow Up  HPI  Hypothyroidism: she has been in a rehab facility for months, still taking levothyroxine but did not bring her medications during her visit today. She states appetite has improved   COPD: from second hand smoking, last visit with Dr. Belia Heman was 10/2021 and gave him Brezti. . She has SOB with activity that has been chronic  She continues to have a daily cough that seems a little worse tan when she was in the rehab facility. She also has a history of asthma and takes singulair, explained it may be secondary to asthma exacerbation due to environmental allergies . She will follow up with Dr. Belia Heman  Morbid Obesity/Malnutrition : BMI is dropping, weight is 238 lbs but was hospitalized in Dec with sepsis and lost a lot of weight, down to 216 lbs, she will take some protein shakes and we will repeat today, she was very anemic upon discharge and appetite is good now.   OA knee: under the care of ortho, using walker for a long time   Right lumbar radiculitis: . She is already taking gabapentin for neuropathy, She has a history of DDD lumbar spine, seen by Dr. Adriana Simas in the past and had procedures done. She also saw Dr. Leeanne Rio but no recent procedures.  She needs to follow up  MDD: chronic and recurrent, she does not want to see psychiatrist, she felt very depressed during hospital stay and during rehab, but family and friends were very supportive, she is feeling better now. Taking medications and refuses to see psychiatrist    Atherosclerosis of aorta  found on CT abdomen, she also had peripheral vascular craterization 04/25 and she has infrarenal obstruction. She is on [employer] and Zetia . She cannot afford Repatha . She had intervention done back in early 22 with angiogram and claudication improved. Unchanged    Diabetes type II insulin requiring  : she is under  the care of Endocrinologist, Dr. Gershon Crane ,lats A1C was 7.16 May 2022  She has dyslipidemia, obesity, CKI, and HTN associated with DM. She is now on Farxiga from assistance program , NPH insulin, and Metformin. She has charcot's foot and history of amputation 5th toe left due to osteomyelitis . She sees podiatrist . Since rehab stay she has not been back to Endo but will schedule follow up soon   CKI:  She is on ARB and Farxiga , bp is controlled, under the care of nephrologist - Dr. Cherylann Ratel. She has good urine output and no pruritis   Heart failure with preserved ejection fracture : EF 65 % but has mild increase in left ventricular wall thickness . Taking medications but needs to bring all medications with her next visit , uses pillpack and was recently at a rehab facility , unable to review her medications. She has atorvastatin and rosuvastatin as active medications on her list. She thinks taking Rosuvastatin only. Advised her to return in 2 weeks for medication reconciliation and contact me about the statins    OSA: she is unable to wear her CPAP, no longer on modafinil  HTN: bp at home is within normal limits, she denies chest pain or palpitation, she has SOB with activity but stable. She saw Dr. Okey Dupre for SOB, likely multifactorial  BP is at goal today   History of Sepsis/Deconditioning and malnutrition:  she was admitted 08/10/2022 due to fall with fever, tachycardia and left knee pain with effusion. She was diagnosed with strep pneumoniae bacteremia with unclear source.  She had a foot wound that grew MSSA and left knee showed urate crystals . TEE negative for endocarditis. She was given 4 weeks of antibiotics for possible septic joint . During her stay she had a seizure deemed secondary to Posterior reversible encephalopathy syndrome. Other diagnosis included traumatic rhabdomyolysis , acute on chronic kidney disease.    Patient Active Problem List   Diagnosis Date Noted   Acute metabolic  encephalopathy 08/25/2022   Posterior reversible encephalopathy syndrome 08/21/2022   Seizure ([name]) 08/20/2022   Multiple falls 08/15/2022   Gouty arthritis 08/15/2022   Pyogenic arthritis of left knee joint ([name]) 08/15/2022   Diabetic ulcer of left midfoot associated with diabetes mellitus due to underlying condition, limited to breakdown of skin ([name]) 08/15/2022   Bacteremia due to Streptococcus pneumoniae 08/12/2022   Acute pain of left knee 08/11/2022   Metabolic acidosis 08/11/2022   Sepsis ([name]) 08/10/2022   Traumatic rhabdomyolysis ([name]) 08/10/2022   Ulcer of left foot due to type 2 diabetes mellitus ([name]) 08/10/2022   Acute renal failure superimposed on stage 3b chronic kidney disease ([name]) 08/10/2022   Major depression in remission ([name]) 07/04/2022   Atherosclerosis of aorta ([name]) 07/04/2022   Insomnia due to other mental disorder 07/04/2022   Amputated toe of left foot ([name]) 07/04/2022   Chronic obstructive pulmonary disease ([name]) 01/28/2021   Diabetes mellitus ([name]) 05/24/2020   B12 deficiency 05/24/2020   Vitamin D deficiency 05/24/2020   Metatarsalgia of left foot 01/15/2020   Major depressive disorder, recurrent episode, moderate ([name]) 04/04/2019   Left arm weakness 04/03/2019   (HFpEF) heart failure with preserved ejection fraction ([name]) 08/31/2017   Moderate persistent asthma 08/31/2017   Hyperlipidemia associated with type 2 diabetes mellitus ([name]) 06/14/2017   Uncontrolled type 2 diabetes mellitus with hyperglycemia, with long-term current use of insulin ([name]) 08/18/2016   Charcot foot due to diabetes mellitus ([name]) 02/22/2016   Acquired abduction deformity of foot 07/12/2015   Osteoarthritis of subtalar joint 07/12/2015   Arthritis of foot, degenerative 07/12/2015   Carpal tunnel syndrome 04/17/2015   Chronic constipation 04/17/2015   Insomnia, persistent 04/17/2015   Stage 3b chronic kidney disease ([name]) 04/17/2015   Decreased exercise tolerance 04/17/2015    Diabetes mellitus with polyneuropathy ([name]) 04/17/2015   Gastroesophageal reflux disease without esophagitis 04/17/2015   Bursitis, trochanteric 04/17/2015   Cephalalgia 04/17/2015   Benign hypertension 04/17/2015   Adult hypothyroidism 04/17/2015   Hearing loss 04/17/2015   Chronic recurrent major depressive disorder ([name]) 04/17/2015   Neurogenic claudication 04/17/2015   Morbid obesity with BMI of 40.0-44.9, adult ([name]) 04/17/2015   Hypo-ovarianism 04/17/2015   Perennial allergic rhinitis with seasonal variation 04/17/2015   Acne erythematosa 04/17/2015   Dyskinesia, tardive 04/17/2015   Memory loss 04/17/2015   Impingement syndrome of shoulder 04/17/2015   Dermatitis, stasis 04/17/2015   Obstructive sleep apnea 05/14/2014   Shortness of breath on exertion 05/06/2014   Mixed hyperlipidemia 02/06/2012   LBP (low back pain) 09/16/2008    Past Surgical History:  Procedure Laterality Date   ABDOMINAL HYSTERECTOMY  1975   ANKLE SURGERY Left approx Jan 2018   APPENDECTOMY  1970   CATARACT EXTRACTION  01/2011   right   COLONOSCOPY WITH PROPOFOL N/A 08/19/2019   Procedure: COLONOSCOPY WITH PROPOFOL;  Surgeon: Wyline Mood, MD;  Location: [facility] END[facility];  Service: Gastroenterology;  Laterality: N/A;   eye lid surgery  2013   bilateral   FOOT SURGERY     LOWER EXTREMITY ANGIOGRAPHY Left 11/23/2020   Procedure: LOWER EXTREMITY ANGIOGRAPHY;  Surgeon: Renford Dills, MD;  Location: [facility] INVASIVE CV LAB;  Service: Cardiovascular;  Laterality: Left;   NECK SURGERY     SPINE SURGERY     TEE WITHOUT CARDIOVERSION N/A 08/16/2022   Procedure: TRANSESOPHAGEAL ECHOCARDIOGRAM (TEE);  Surgeon: Debbe Odea, MD;  Location: [facility] ORS;  Service: Cardiovascular;  Laterality: N/A;   TUBAL LIGATION     VAGINAL HYSTERECTOMY  1989    Family History  Problem Relation Age of Onset   Aneurysm Mother    Aortic aneurysm Mother    Hypertension Mother    Hyperlipidemia Mother    Heart disease  Mother    Obesity Mother    Heart attack Maternal Grandfather    Diabetes Maternal Grandfather     Social History   Tobacco Use   Smoking status: Never   Smokeless tobacco: Never  Substance Use Topics   Alcohol use: No    Alcohol/week: 0.0 standard drinks of alcohol     Current Outpatient Medications:    acetaminophen (TYLENOL 8 HOUR) 650 MG CR tablet, Take 1 tablet (650 mg total) by mouth every 8 (eight) hours as needed for pain., Disp: 90 tablet, Rfl: 2   albuterol (VENTOLIN HFA) 108 (90 Base) MCG/ACT inhaler, Inhale 2 puffs into the lungs every 6 (six) hours as needed for wheezing or shortness of breath., Disp: 8 g, Rfl: 2   allopurinol (ZYLOPRIM) 100 MG tablet, Take 0.5 tablets (50 mg total) by mouth daily., Disp: , Rfl:    amLODipine (NORVASC) 10 MG tablet, Take 1 tablet (10 mg total) by mouth daily., Disp: , Rfl:    ASPIRIN LOW DOSE 81 MG EC tablet, TAKE 1 TABLET (81 MG TOTAL) BY MOUTH DAILY., Disp: 30 tablet, Rfl: 0   Budeson-Glycopyrrol-Formoterol (BREZTRI AEROSPHERE) 160-9-4.8 MCG/ACT AERO, Inhale 2 puffs into the lungs in the morning and at bedtime., Disp: 5.9 g, Rfl: 0   Cholecalciferol (VITAMIN D3) 25 MCG (1000 UT) CAPS, Take 1,000 Units by mouth., Disp: , Rfl:    Continuous Blood Gluc Sensor (FREESTYLE LIBRE 2 SENSOR) MISC, FOR GLUCOSE MONITORING, Disp: , Rfl:    diclofenac Sodium (VOLTAREN) 1 % GEL, Apply 2 g topically 2 (two) times daily., Disp: , Rfl:    ezetimibe (ZETIA) 10 MG tablet, Take 1 tablet (10 mg total) by mouth daily., Disp: 90 tablet, Rfl: 3   furosemide (LASIX) 20 MG tablet, Take 1 tablet (20 mg total) by mouth daily., Disp: 90 tablet, Rfl: 1   insulin NPH-regular Human (70-30) 100 UNIT/ML injection, Inject 45 units after breakfast and dinner if eating > 50% of meals, Disp: 10 mL, Rfl: 11   levothyroxine (SYNTHROID) 25 MCG tablet, Take 1 tablet (25 mcg total) by mouth daily before breakfast. Two on Sunday, Disp: 100 tablet, Rfl: 1   metFORMIN (GLUCOPHAGE)  500 MG tablet, Take 2 tablets by mouth daily with breakfast., Disp: 90 tablet, Rfl: 0   metoprolol tartrate (LOPRESSOR) 50 MG tablet, Take 1 tablet (50 mg total) by mouth 2 (two) times daily., Disp: , Rfl:    Misc Natural Products (OSTEO BI-FLEX ADV JOINT SHIELD PO), Take by mouth 2 (two) times daily. , Disp: , Rfl:    montelukast (SINGULAIR) 10 MG tablet, Take 1 tablet (10 mg total) by mouth daily., Disp: 90 tablet, Rfl: 0   Multiple Vitamin (  MULTIVITAMIN WITH MINERALS) TABS tablet, Take 1 tablet by mouth daily., Disp: , Rfl:    omeprazole (PRILOSEC) 40 MG capsule, Take 1 capsule (40 mg total) by mouth daily., Disp: 90 capsule, Rfl: 1   Potassium 99 MG TABS, Take by mouth daily at 6 (six) AM., Disp: , Rfl:    Probiotic Product (PROBIOTIC-10 PO), Take by mouth., Disp: , Rfl:    RELION INSULIN SYRINGE 1ML/31G 31G X 5/16" 1 ML MISC, , Disp: , Rfl:    sertraline (ZOLOFT) 100 MG tablet, Take 1.5 tablets (150 mg total) by mouth daily., Disp: 135 tablet, Rfl: 1   traZODone (DESYREL) 100 MG tablet, Take 1 tablet (100 mg total) by mouth at bedtime as needed for sleep. (Patient taking differently: Take 125 mg by mouth at bedtime as needed for sleep.), Disp: 90 tablet, Rfl: 1   VASCEPA 1 g capsule, Take 2 capsules (2 g total) by mouth 2 (two) times daily., Disp: 120 capsule, Rfl: 5   Cyanocobalamin (VITAMIN B12 PO), Take 1 tablet by mouth daily.  (Patient not taking: Reported on 01/15/2023), Disp: , Rfl:    feeding supplement (ENSURE ENLIVE / ENSURE PLUS) LIQD, Take 237 mLs by mouth 3 (three) times daily. (Patient not taking: Reported on 01/15/2023), Disp: 237 mL, Rfl: 12   pregabalin (LYRICA) 150 MG capsule, Take 1 capsule (150 mg total) by mouth 2 (two) times daily for 10 days., Disp: 20 capsule, Rfl: 0   rosuvastatin (CRESTOR) 40 MG tablet, Take 1 tablet (40 mg total) by mouth daily. (Patient not taking: Reported on 01/15/2023), Disp: 90 tablet, Rfl: 1  Allergies  Allergen Reactions   Codeine Other (See  Comments)    "TRIPPED OUT"  DIDN'T LIKE THE WAY IT FELT   Atorvastatin     muscle pain   Hydrocodone     itching   Tramadol    Latex Rash   Zolpidem Other (See Comments)    Sleep walk    I personally reviewed active problem list, medication list, allergies, family history, social history, health maintenance with the patient/caregiver today.   ROS  Ten systems reviewed and is negative except as mentioned in HPI   Objective  Vitals:   01/15/23 1122  BP: 122/70  Pulse: 90  Resp: 16  SpO2: 98%  Weight: 216 lb (98 kg)  Height: 5\' 2"  (1.575 m)    Body mass index is 39.51 kg/m.  Physical Exam  Constitutional: Patient appears well-developed sitting on chair - used walker to come in the office,  No distress.  HEENT: head atraumatic, normocephalic, pupils equal and reactive to light, neck supple Cardiovascular: Normal rate, regular rhythm and normal heart sounds.  No murmur heard. No BLE edema. Pulmonary/Chest: Effort normal and breath sounds normal. No respiratory distress. Abdominal: Soft.  There is no tenderness. Psychiatric: Patient has a normal mood and affect. behavior is normal. Judgment and thought content normal.   PHQ2/9:    01/15/2023   11:21 AM 07/04/2022    2:24 PM 06/30/2022    1:13 PM 04/04/2022    1:33 PM 02/20/2022   10:15 AM  Depression screen PHQ 2/9  Decreased Interest 3 0 0 2 0  Down, Depressed, Hopeless 0 0 0 1 0  PHQ - 2 Score 3 0 0 3 0  Altered sleeping 3 0 0 3 0  Tired, decreased energy 0 0 0 3 0  Change in appetite 0 0 0 2 0  Feeling bad or failure about yourself  0 0  0 2 0  Trouble concentrating 0 0 0 2 0  Moving slowly or fidgety/restless 0 0 0 0 0  Suicidal thoughts 0 0 0 0 0  PHQ-9 Score 6 0 0 15 0  Difficult doing work/chores   Not difficult at all Very difficult Not difficult at all    phq 9 is negative   Fall Risk:    01/15/2023   11:21 AM 07/04/2022    2:24 PM 06/30/2022    1:11 PM 04/04/2022    1:22 PM 02/20/2022   10:15 AM   Fall Risk   Falls in the past year? 0 1 1 0 0  Number falls in past yr: 0 1 1  0  Injury with Fall? 0 1 1  0  Risk for fall due to : Impaired balance/gait;Impaired mobility Impaired balance/gait;Impaired mobility;History of fall(s) Impaired balance/gait;Impaired mobility Impaired balance/gait No Fall Risks  Follow up Falls prevention discussed Falls prevention discussed;Education provided;Falls evaluation completed  Falls prevention discussed;Education provided;Falls evaluation completed Falls prevention discussed;Education provided      Functional Status Survey: Is the patient deaf or have difficulty hearing?: No Does the patient have difficulty seeing, even when wearing glasses/contacts?: Yes Does the patient have difficulty concentrating, remembering, or making decisions?: No Does the patient have difficulty walking or climbing stairs?: Yes Does the patient have difficulty dressing or bathing?: Yes Does the patient have difficulty doing errands alone such as visiting a doctor's office or shopping?: Yes    Assessment & Plan  1. Atherosclerosis of aorta ([name])  On statin therapy   2. Morbid obesity ([name])  She lost weight, involuntarily due to admission and being septic  3. Dyslipidemia associated with type 2 diabetes mellitus ([name])  - Hemoglobin A1c  4. PAD (peripheral artery disease) ([name])  stable  5. Chronic heart failure with preserved ejection fraction ([name])  She will return for medication reconciliation  6. Stage 3b chronic kidney disease ([name]) = - CBC with Differential/Platelet - COMPLETE METABOLIC PANEL WITH GFR - VITAMIN D 25 Hydroxy (Vit-D Deficiency, Fractures) - PTH, intact and calcium  7. Amputated toe of left foot ([name])  stable  8. Charcot foot due to diabetes mellitus ([name])  Needs to keep follow up with podiatrist   9. Mild protein-calorie malnutrition ([name])  She is eating well now, secondary to admission in Nov   10. Chronic obstructive  pulmonary disease, unspecified COPD type ([name])  Under the care of Dr. Belia Heman  11. MDD (major depressive disorder), recurrent episode, moderate ([name])  Continue current regiment, does not want to see psychiatrist or therapist   92. Gastroesophageal reflux disease without esophagitis  Controlled   13. Adult hypothyroidism  - TSH  14. Insomnia due to other mental disorder  stable  15. Asthma, mild intermittent, well-controlled  Advised to follow up with Dr. Belia Heman, cough has increased  16. History of sepsis   17. Breast cancer screening by mammogram  - MM 3D SCREENING MAMMOGRAM BILATERAL BREAST; Future  18. Other specified nutritional anemias  - VITAMIN D 25 Hydroxy (Vit-D Deficiency, Fractures) - Vitamin B12 - Iron, TIBC and Ferritin Panel  19. Abnormal finding on MRI of brain  - MR Brain Wo Contrast; Future

## 2023-01-15 ENCOUNTER — Ambulatory Visit (INDEPENDENT_AMBULATORY_CARE_PROVIDER_SITE_OTHER): Payer: No Typology Code available for payment source | Admitting: Family Medicine

## 2023-01-15 ENCOUNTER — Encounter: Payer: Self-pay | Admitting: Family Medicine

## 2023-01-15 VITALS — BP 122/70 | HR 90 | Resp 16 | Ht 62.0 in | Wt 216.0 lb

## 2023-01-15 DIAGNOSIS — E785 Hyperlipidemia, unspecified: Secondary | ICD-10-CM | POA: Diagnosis not present

## 2023-01-15 DIAGNOSIS — J449 Chronic obstructive pulmonary disease, unspecified: Secondary | ICD-10-CM

## 2023-01-15 DIAGNOSIS — I700 Atherosclerosis of aorta: Secondary | ICD-10-CM | POA: Diagnosis not present

## 2023-01-15 DIAGNOSIS — J4520 Mild intermittent asthma, uncomplicated: Secondary | ICD-10-CM

## 2023-01-15 DIAGNOSIS — E1169 Type 2 diabetes mellitus with other specified complication: Secondary | ICD-10-CM

## 2023-01-15 DIAGNOSIS — I5032 Chronic diastolic (congestive) heart failure: Secondary | ICD-10-CM | POA: Diagnosis not present

## 2023-01-15 DIAGNOSIS — E11610 Type 2 diabetes mellitus with diabetic neuropathic arthropathy: Secondary | ICD-10-CM | POA: Diagnosis not present

## 2023-01-15 DIAGNOSIS — F331 Major depressive disorder, recurrent, moderate: Secondary | ICD-10-CM | POA: Diagnosis not present

## 2023-01-15 DIAGNOSIS — S98132A Complete traumatic amputation of one left lesser toe, initial encounter: Secondary | ICD-10-CM

## 2023-01-15 DIAGNOSIS — I739 Peripheral vascular disease, unspecified: Secondary | ICD-10-CM

## 2023-01-15 DIAGNOSIS — Z8619 Personal history of other infectious and parasitic diseases: Secondary | ICD-10-CM

## 2023-01-15 DIAGNOSIS — K219 Gastro-esophageal reflux disease without esophagitis: Secondary | ICD-10-CM

## 2023-01-15 DIAGNOSIS — D538 Other specified nutritional anemias: Secondary | ICD-10-CM

## 2023-01-15 DIAGNOSIS — R9089 Other abnormal findings on diagnostic imaging of central nervous system: Secondary | ICD-10-CM

## 2023-01-15 DIAGNOSIS — Z794 Long term (current) use of insulin: Secondary | ICD-10-CM

## 2023-01-15 DIAGNOSIS — E441 Mild protein-calorie malnutrition: Secondary | ICD-10-CM | POA: Diagnosis not present

## 2023-01-15 DIAGNOSIS — Z1231 Encounter for screening mammogram for malignant neoplasm of breast: Secondary | ICD-10-CM

## 2023-01-15 DIAGNOSIS — E039 Hypothyroidism, unspecified: Secondary | ICD-10-CM

## 2023-01-15 DIAGNOSIS — N1832 Chronic kidney disease, stage 3b: Secondary | ICD-10-CM | POA: Diagnosis not present

## 2023-01-15 DIAGNOSIS — F5105 Insomnia due to other mental disorder: Secondary | ICD-10-CM

## 2023-01-16 ENCOUNTER — Other Ambulatory Visit: Payer: Self-pay | Admitting: Family Medicine

## 2023-01-16 DIAGNOSIS — D72829 Elevated white blood cell count, unspecified: Secondary | ICD-10-CM

## 2023-01-16 DIAGNOSIS — R944 Abnormal results of kidney function studies: Secondary | ICD-10-CM

## 2023-01-16 LAB — COMPLETE METABOLIC PANEL WITH GFR
AG Ratio: 1.7 (calc) (ref 1.0–2.5)
ALT: 10 U/L (ref 6–29)
AST: 15 U/L (ref 10–35)
Albumin: 4.3 g/dL (ref 3.6–5.1)
Alkaline phosphatase (APISO): 81 U/L (ref 37–153)
BUN/Creatinine Ratio: 19 (calc) (ref 6–22)
BUN: 40 mg/dL — ABNORMAL HIGH (ref 7–25)
CO2: 24 mmol/L (ref 20–32)
Calcium: 9.8 mg/dL (ref 8.6–10.4)
Chloride: 102 mmol/L (ref 98–110)
Creat: 2.07 mg/dL — ABNORMAL HIGH (ref 0.60–1.00)
Globulin: 2.5 g/dL (calc) (ref 1.9–3.7)
Glucose, Bld: 218 mg/dL — ABNORMAL HIGH (ref 65–99)
Potassium: 5 mmol/L (ref 3.5–5.3)
Sodium: 139 mmol/L (ref 135–146)
Total Bilirubin: 0.3 mg/dL (ref 0.2–1.2)
Total Protein: 6.8 g/dL (ref 6.1–8.1)
eGFR: 25 mL/min/{1.73_m2} — ABNORMAL LOW (ref >=60)

## 2023-01-16 LAB — CBC WITH DIFFERENTIAL/PLATELET
Absolute Monocytes: 932 cells/uL (ref 200–950)
Basophils Absolute: 100 cells/uL (ref 0–200)
Basophils Relative: 0.9 %
Eosinophils Absolute: 444 cells/uL (ref 15–500)
Eosinophils Relative: 4 %
HCT: 34.8 % — ABNORMAL LOW (ref 35.0–45.0)
Hemoglobin: 11 g/dL — ABNORMAL LOW (ref 11.7–15.5)
Lymphs Abs: 2664 cells/uL (ref 850–3900)
MCH: 27.4 pg (ref 27.0–33.0)
MCHC: 31.6 g/dL — ABNORMAL LOW (ref 32.0–36.0)
MCV: 86.8 fL (ref 80.0–100.0)
MPV: 11.3 fL (ref 7.5–12.5)
Monocytes Relative: 8.4 %
Neutro Abs: 6960 cells/uL (ref 1500–7800)
Neutrophils Relative %: 62.7 %
Platelets: 279 10*3/uL (ref 140–400)
RBC: 4.01 10*6/uL (ref 3.80–5.10)
RDW: 15.2 % — ABNORMAL HIGH (ref 11.0–15.0)
Total Lymphocyte: 24 %
WBC: 11.1 10*3/uL — ABNORMAL HIGH (ref 3.8–10.8)

## 2023-01-16 LAB — HEMOGLOBIN A1C
Hgb A1c MFr Bld: 7 % of total Hgb — ABNORMAL HIGH (ref <5.7)
Mean Plasma Glucose: 154 mg/dL
eAG (mmol/L): 8.5 mmol/L

## 2023-01-16 LAB — PTH, INTACT AND CALCIUM
Calcium: 9.8 mg/dL (ref 8.6–10.4)
PTH: 74 pg/mL (ref 16–77)

## 2023-01-16 LAB — IRON,TIBC AND FERRITIN PANEL
%SAT: 20 % (calc) (ref 16–45)
Ferritin: 69 ng/mL (ref 16–288)
Iron: 61 ug/dL (ref 45–160)
TIBC: 311 mcg/dL (calc) (ref 250–450)

## 2023-01-16 LAB — VITAMIN B12: Vitamin B-12: 1835 pg/mL — ABNORMAL HIGH (ref 200–1100)

## 2023-01-16 LAB — TSH: TSH: 2.45 mIU/L (ref 0.40–4.50)

## 2023-01-16 LAB — VITAMIN D 25 HYDROXY (VIT D DEFICIENCY, FRACTURES): Vit D, 25-Hydroxy: 59 ng/mL (ref 30–100)

## 2023-01-23 ENCOUNTER — Ambulatory Visit
Admission: RE | Admit: 2023-01-23 | Discharge: 2023-01-23 | Disposition: A | Discharge status: Home / Self Care | Payer: No Typology Code available for payment source | Source: Ambulatory Visit | Attending: Family Medicine | Admitting: Family Medicine

## 2023-01-23 DIAGNOSIS — R9089 Other abnormal findings on diagnostic imaging of central nervous system: Secondary | ICD-10-CM | POA: Insufficient documentation

## 2023-01-23 DIAGNOSIS — G9340 Encephalopathy, unspecified: Secondary | ICD-10-CM | POA: Diagnosis not present

## 2023-01-25 DIAGNOSIS — I152 Hypertension secondary to endocrine disorders: Secondary | ICD-10-CM | POA: Diagnosis not present

## 2023-01-25 DIAGNOSIS — E1169 Type 2 diabetes mellitus with other specified complication: Secondary | ICD-10-CM | POA: Diagnosis not present

## 2023-01-25 DIAGNOSIS — E039 Hypothyroidism, unspecified: Secondary | ICD-10-CM | POA: Diagnosis not present

## 2023-01-25 DIAGNOSIS — E1159 Type 2 diabetes mellitus with other circulatory complications: Secondary | ICD-10-CM | POA: Diagnosis not present

## 2023-01-25 DIAGNOSIS — E785 Hyperlipidemia, unspecified: Secondary | ICD-10-CM | POA: Diagnosis not present

## 2023-01-25 DIAGNOSIS — Z6841 Body Mass Index (BMI) 40.0 and over, adult: Secondary | ICD-10-CM | POA: Diagnosis not present

## 2023-01-25 DIAGNOSIS — E1142 Type 2 diabetes mellitus with diabetic polyneuropathy: Secondary | ICD-10-CM | POA: Diagnosis not present

## 2023-01-25 DIAGNOSIS — Z794 Long term (current) use of insulin: Secondary | ICD-10-CM | POA: Diagnosis not present

## 2023-01-26 NOTE — Progress Notes (Unsigned)
Name: Summer [name]   MRN: [id number]    DOB: 1950-10-20   Date:01/29/2023       Progress Note  Subjective  Chief Complaint  Follow Up  HPI  Hypothyroidism: she has been in a rehab facility for months, still taking levothyroxine , she only brought a list again ( from Dec ) not sure how accurate TSH level done in May was at goal , continue current dose   COPD: from second hand smoking, last visit with Dr. Belia Heman was 10/2021 and gave him Brezti. . She has SOB with activity that has been chronic  She continues to have a daily cough that seems a little worse tan when she was in the rehab facility. She also has a history of asthma and takes singulair, explained it may be secondary to asthma exacerbation due to environmental allergies . She  has not scheduled a follow up with Dr. Belia Heman yet   Morbid Obesity/Malnutrition : BMI is dropping, weight is 238 lbs but was hospitalized in Dec with sepsis and lost a lot of weight, down to 215.9  lbs, she will take some protein shakes Anemia has improved   Diabetes type II insulin requiring  : she is under the care of Endocrinologist, Dr. Gershon Crane ,lats A1C was 7.16 May 2022 and down to 7 % in May.  She has dyslipidemia, obesity, CKI, and HTN associated with DM. She is now on Farxiga from assistance program< Metformin and recently switched from NPH to [place] . She has charcot's foot and history of amputation 5th toe left due to osteomyelitis . She saw Podiatrist recently   CKI stage 3b:  She is on ARB and Farxiga , bp is controlled, under the care of nephrologist - Dr. Cherylann Ratel. She has good urine output and no pruritis   Heart failure with preserved ejection fracture : EF 65 % but has mild increase in left ventricular wall thickness . Taking medications but needs to bring all medications with her next visit , uses pillpack and was recently at a rehab facility ,asked her to come in with all her medications today but she only brought the directions from pill pack. Not  sure how accurate the list is.   OSA: she is unable to wear her CPAP, no longer on modafinil, she is feeling sluggish but likely due to low bp.   HTN: bp at home is within normal limits, it is towards low end of normal today , she denies chest pain or palpitation, she has SOB with activity but stable. She saw Dr. Okey Dupre for SOB, likely multifactorial  I am worried that her bp is too low today and may be causing her to feel tired. Not sure if she is taking norvasc 10 mg or 5 mg . She will verify when she gets home and monitor bp , if remains low she will contact us back   Abnormal MRI brain:  discussed results with patient during her visit , continue aggressive control of DM, statin therapy and monitor   Patient Active Problem List   Diagnosis Date Noted   Posterior reversible encephalopathy syndrome 08/21/2022   Seizure ([name]) 08/20/2022   Multiple falls 08/15/2022   Gouty arthritis 08/15/2022   Diabetic ulcer of left midfoot associated with diabetes mellitus due to underlying condition, limited to breakdown of skin ([name]) 08/15/2022   Bacteremia due to Streptococcus pneumoniae 08/12/2022   Metabolic acidosis 08/11/2022   Ulcer of left foot due to type 2 diabetes mellitus ([name]) 08/10/2022  Major depression in remission ([name]) 07/04/2022   Atherosclerosis of aorta ([name]) 07/04/2022   Insomnia due to other mental disorder 07/04/2022   Amputated toe of left foot ([name]) 07/04/2022   Chronic obstructive pulmonary disease ([name]) 01/28/2021   Diabetes mellitus ([name]) 05/24/2020   B12 deficiency 05/24/2020   Vitamin D deficiency 05/24/2020   Metatarsalgia of left foot 01/15/2020   Major depressive disorder, recurrent episode, moderate ([name]) 04/04/2019   Left arm weakness 04/03/2019   (HFpEF) heart failure with preserved ejection fraction ([name]) 08/31/2017   Moderate persistent asthma 08/31/2017   Hyperlipidemia associated with type 2 diabetes mellitus ([name]) 06/14/2017   Uncontrolled type 2 diabetes  mellitus with hyperglycemia, with long-term current use of insulin ([name]) 08/18/2016   Charcot foot due to diabetes mellitus ([name]) 02/22/2016   Acquired abduction deformity of foot 07/12/2015   Osteoarthritis of subtalar joint 07/12/2015   Arthritis of foot, degenerative 07/12/2015   Carpal tunnel syndrome 04/17/2015   Chronic constipation 04/17/2015   Insomnia, persistent 04/17/2015   Stage 3b chronic kidney disease ([name]) 04/17/2015   Decreased exercise tolerance 04/17/2015   Diabetes mellitus with polyneuropathy ([name]) 04/17/2015   Gastroesophageal reflux disease without esophagitis 04/17/2015   Bursitis, trochanteric 04/17/2015   Cephalalgia 04/17/2015   Benign hypertension 04/17/2015   Adult hypothyroidism 04/17/2015   Hearing loss 04/17/2015   Chronic recurrent major depressive disorder ([name]) 04/17/2015   Neurogenic claudication 04/17/2015   Morbid obesity with BMI of 40.0-44.9, adult ([name]) 04/17/2015   Hypo-ovarianism 04/17/2015   Perennial allergic rhinitis with seasonal variation 04/17/2015   Acne erythematosa 04/17/2015   Dyskinesia, tardive 04/17/2015   Memory loss 04/17/2015   Impingement syndrome of shoulder 04/17/2015   Dermatitis, stasis 04/17/2015   Obstructive sleep apnea 05/14/2014   Shortness of breath on exertion 05/06/2014   Mixed hyperlipidemia 02/06/2012   LBP (low back pain) 09/16/2008    Past Surgical History:  Procedure Laterality Date   ABDOMINAL HYSTERECTOMY  1975   ANKLE SURGERY Left approx Jan 2018   APPENDECTOMY  1970   CATARACT EXTRACTION  01/2011   right   COLONOSCOPY WITH PROPOFOL N/A 08/19/2019   Procedure: COLONOSCOPY WITH PROPOFOL;  Surgeon: Wyline Mood, MD;  Location[facility] ENDOSCOPY;  Service: Gastroenterology;  Laterality: N/A;   eye lid surgery  2013   bilateral   FOOT SURGERY     LOWER EXTREMITY ANGIOGRAPHY Left 11/23/2020   Procedure: LOWER EXTREMITY ANGIOGRAPHY;  Surgeon: Renford Dills, MD;  Location: [facility] INVASIVE CV LAB;   Service: Cardiovascular;  Laterality: Left;   NECK SURGERY     SPINE SURGERY     TEE WITHOUT CARDIOVERSION N/A 08/16/2022   Procedure: TRANSESOPHAGEAL ECHOCARDIOGRAM (TEE);  Surgeon: Debbe Odea, MD;  Location: [facility] ORS;  Service: Cardiovascular;  Laterality: N/A;   TUBAL LIGATION     VAGINAL HYSTERECTOMY  1989    Family History  Problem Relation Age of Onset   Aneurysm Mother    Aortic aneurysm Mother    Hypertension Mother    Hyperlipidemia Mother    Heart disease Mother    Obesity Mother    Heart attack Maternal Grandfather    Diabetes Maternal Grandfather     Social History   Tobacco Use   Smoking status: Never   Smokeless tobacco: Never  Substance Use Topics   Alcohol use: No    Alcohol/week: 0.0 standard drinks of alcohol     Current Outpatient Medications:    acetaminophen (TYLENOL 8 HOUR) 650 MG CR tablet, Take 1 tablet (650  mg total) by mouth every 8 (eight) hours as needed for pain., Disp: 90 tablet, Rfl: 2   albuterol (VENTOLIN HFA) 108 (90 Base) MCG/ACT inhaler, Inhale 2 puffs into the lungs every 6 (six) hours as needed for wheezing or shortness of breath., Disp: 8 g, Rfl: 2   allopurinol (ZYLOPRIM) 100 MG tablet, Take 0.5 tablets (50 mg total) by mouth daily., Disp: , Rfl:    amLODipine (NORVASC) 10 MG tablet, Take 1 tablet (10 mg total) by mouth daily. (Patient taking differently: Take 5 mg by mouth daily.), Disp: , Rfl:    ASPIRIN LOW DOSE 81 MG EC tablet, TAKE 1 TABLET (81 MG TOTAL) BY MOUTH DAILY., Disp: 30 tablet, Rfl: 0   B-D ULTRAFINE III SHORT PEN 31G X 8 MM MISC, SMARTSIG:1 Injection Daily, Disp: , Rfl:    Budeson-Glycopyrrol-Formoterol (BREZTRI AEROSPHERE) 160-9-4.8 MCG/ACT AERO, Inhale 2 puffs into the lungs in the morning and at bedtime., Disp: 5.9 g, Rfl: 0   Cholecalciferol (VITAMIN D3) 25 MCG (1000 UT) CAPS, Take 1,000 Units by mouth., Disp: , Rfl:    Continuous Blood Gluc Sensor (FREESTYLE LIBRE 2 SENSOR) MISC, FOR GLUCOSE MONITORING, Disp:  , Rfl:    Cyanocobalamin (VITAMIN B12 PO), Take 1 tablet by mouth daily., Disp: , Rfl:    dapagliflozin propanediol (FARXIGA) 10 MG TABS tablet, Take 10 mg by mouth daily., Disp: , Rfl:    diclofenac Sodium (VOLTAREN) 1 % GEL, Apply 2 g topically 2 (two) times daily., Disp: , Rfl:    ezetimibe (ZETIA) 10 MG tablet, Take 1 tablet (10 mg total) by mouth daily., Disp: 90 tablet, Rfl: 3   feeding supplement (ENSURE ENLIVE / ENSURE PLUS) LIQD, Take 237 mLs by mouth 3 (three) times daily., Disp: 237 mL, Rfl: 12   furosemide (LASIX) 20 MG tablet, Take 1 tablet (20 mg total) by mouth daily., Disp: 90 tablet, Rfl: 1   insulin NPH-regular Human (70-30) 100 UNIT/ML injection, Inject 45 units after breakfast and dinner if eating > 50% of meals, Disp: 10 mL, Rfl: 11   levothyroxine (SYNTHROID) 25 MCG tablet, Take 1 tablet (25 mcg total) by mouth daily before breakfast. Two on Sunday, Disp: 100 tablet, Rfl: 1   metFORMIN (GLUCOPHAGE) 500 MG tablet, Take 2 tablets by mouth daily with breakfast., Disp: 90 tablet, Rfl: 0   metoprolol tartrate (LOPRESSOR) 50 MG tablet, Take 1 tablet (50 mg total) by mouth 2 (two) times daily., Disp: , Rfl:    Misc Natural Products (OSTEO BI-FLEX ADV JOINT SHIELD PO), Take by mouth 2 (two) times daily., Disp: , Rfl:    montelukast (SINGULAIR) 10 MG tablet, Take 1 tablet (10 mg total) by mouth daily., Disp: 90 tablet, Rfl: 0   Multiple Vitamin (MULTIVITAMIN WITH MINERALS) TABS tablet, Take 1 tablet by mouth daily., Disp: , Rfl:    omeprazole (PRILOSEC) 40 MG capsule, Take 1 capsule (40 mg total) by mouth daily., Disp: 90 capsule, Rfl: 1   Potassium 99 MG TABS, Take by mouth daily at 6 (six) AM., Disp: , Rfl:    pregabalin (LYRICA) 150 MG capsule, Take 1 capsule (150 mg total) by mouth 2 (two) times daily for 10 days., Disp: 20 capsule, Rfl: 0   Probiotic Product (PROBIOTIC-10 PO), Take by mouth., Disp: , Rfl:    RELION INSULIN SYRINGE 1ML/31G 31G X 5/16" 1 ML MISC, , Disp: , Rfl:     rosuvastatin (CRESTOR) 40 MG tablet, Take 1 tablet (40 mg total) by mouth daily., Disp: 90 tablet,  Rfl: 1   sertraline (ZOLOFT) 100 MG tablet, Take 1.5 tablets (150 mg total) by mouth daily., Disp: 135 tablet, Rfl: 1   traZODone (DESYREL) 100 MG tablet, Take 1 tablet (100 mg total) by mouth at bedtime as needed for sleep. (Patient taking differently: Take 150 mg by mouth at bedtime as needed for sleep.), Disp: 90 tablet, Rfl: 1   TRESIBA FLEXTOUCH 200 UNIT/ML FlexTouch Pen, SMARTSIG:60 Unit(s) SUB-Q Daily, Disp: , Rfl:    VASCEPA 1 g capsule, Take 2 capsules (2 g total) by mouth 2 (two) times daily., Disp: 120 capsule, Rfl: 5  Allergies  Allergen Reactions   Codeine Other (See Comments)    "TRIPPED OUT"  DIDN'T LIKE THE WAY IT FELT   Atorvastatin     muscle pain   Hydrocodone     itching   Tramadol    Latex Rash   Zolpidem Other (See Comments)    Sleep walk    I personally reviewed active problem list, medication list, allergies, family history, social history, health maintenance with the patient/caregiver today.   ROS  Ten systems reviewed and is negative except as mentioned in HPI   Objective  Vitals:   01/29/23 1442  BP: 108/70  Pulse: 85  Resp: 20  Temp: (!) 97.5 F (36.4 C)  TempSrc: Oral  SpO2: 96%  Weight: 215 lb 14.4 oz (97.9 kg)  Height: 5\' 2"  (1.575 m)    Body mass index is 39.49 kg/m.  Physical Exam  Constitutional: Patient appears well-developed and well-nourished. Obese  No distress.  HEENT: head atraumatic, normocephalic, pupils equal and reactive to light, neck supple Cardiovascular: Normal rate, regular rhythm and normal heart sounds.  No murmur heard. Trace BLE edema. Pulmonary/Chest: Effort normal and breath sounds normal. No respiratory distress. Abdominal: Soft.  There is no tenderness. Psychiatric: Patient has a normal mood and affect. behavior is normal. Judgment and thought content normal.    PHQ2/9:    01/29/2023    2:23 PM 01/15/2023    11:21 AM 07/04/2022    2:24 PM 06/30/2022    1:13 PM 04/04/2022    1:33 PM  Depression screen PHQ 2/9  Decreased Interest 3 3 0 0 2  Down, Depressed, Hopeless 0 0 0 0 1  PHQ - 2 Score 3 3 0 0 3  Altered sleeping 3 3 0 0 3  Tired, decreased energy 0 0 0 0 3  Change in appetite 0 0 0 0 2  Feeling bad or failure about yourself  0 0 0 0 2  Trouble concentrating 0 0 0 0 2  Moving slowly or fidgety/restless 0 0 0 0 0  Suicidal thoughts 0 0 0 0 0  PHQ-9 Score 6 6 0 0 15  Difficult doing work/chores Not difficult at all   Not difficult at all Very difficult    phq 9 is positive   Fall Risk:    01/29/2023    2:23 PM 01/15/2023   11:21 AM 07/04/2022    2:24 PM 06/30/2022    1:11 PM 04/04/2022    1:22 PM  Fall Risk   Falls in the past year? 0 0 1 1 0  Number falls in past yr:  0 1 1   Injury with Fall?  0 1 1   Risk for fall due to : Impaired balance/gait Impaired balance/gait;Impaired mobility Impaired balance/gait;Impaired mobility;History of fall(s) Impaired balance/gait;Impaired mobility Impaired balance/gait  Follow up Falls prevention discussed Falls prevention discussed Falls prevention discussed;Education provided;Falls evaluation  completed  Falls prevention discussed;Education provided;Falls evaluation completed     Assessment & Plan  1. Chronic heart failure with preserved ejection fraction ([name])  Schedule follow up with cardiologist   She still did not bring all of her medications and will need to return for review. BP today is low, on our chart we have one dose and on her papers it has different doses and medications . She brought papers from Dec  2. Dyslipidemia associated with type 2 diabetes mellitus ([name])  Reviewed notes from Dr. Gershon Crane A1C improved, now on Tresiba and off NPH  3. Stage 3b chronic kidney disease ([name])  On SGL2 agonist and ARB, kidney function dropped, explained we can recheck next visit, she needs to drink more water since BUN was also elevated    4. Abnormal finding on MRI of brain  We contacted radiology and results reviewed with patient before she left. Explained importance of continues tight DM control, statin therapy  5. Mild protein-calorie malnutrition ([name])  Weigh stable since last visit

## 2023-01-29 ENCOUNTER — Encounter: Payer: Self-pay | Admitting: Family Medicine

## 2023-01-29 ENCOUNTER — Ambulatory Visit (INDEPENDENT_AMBULATORY_CARE_PROVIDER_SITE_OTHER): Payer: No Typology Code available for payment source | Admitting: Family Medicine

## 2023-01-29 VITALS — BP 108/70 | HR 85 | Temp 97.5°F | Resp 20 | Ht 62.0 in | Wt 215.9 lb

## 2023-01-29 DIAGNOSIS — Z794 Long term (current) use of insulin: Secondary | ICD-10-CM

## 2023-01-29 DIAGNOSIS — I5032 Chronic diastolic (congestive) heart failure: Secondary | ICD-10-CM | POA: Diagnosis not present

## 2023-01-29 DIAGNOSIS — E1169 Type 2 diabetes mellitus with other specified complication: Secondary | ICD-10-CM | POA: Diagnosis not present

## 2023-01-29 DIAGNOSIS — E785 Hyperlipidemia, unspecified: Secondary | ICD-10-CM

## 2023-01-29 DIAGNOSIS — N1832 Chronic kidney disease, stage 3b: Secondary | ICD-10-CM

## 2023-01-29 DIAGNOSIS — E441 Mild protein-calorie malnutrition: Secondary | ICD-10-CM | POA: Diagnosis not present

## 2023-01-29 DIAGNOSIS — R9089 Other abnormal findings on diagnostic imaging of central nervous system: Secondary | ICD-10-CM

## 2023-01-30 ENCOUNTER — Encounter: Payer: Self-pay | Admitting: Podiatry

## 2023-01-30 ENCOUNTER — Ambulatory Visit (INDEPENDENT_AMBULATORY_CARE_PROVIDER_SITE_OTHER): Payer: No Typology Code available for payment source | Admitting: Podiatry

## 2023-01-30 VITALS — BP 122/51 | HR 57

## 2023-01-30 DIAGNOSIS — M79674 Pain in right toe(s): Secondary | ICD-10-CM | POA: Diagnosis not present

## 2023-01-30 DIAGNOSIS — L97522 Non-pressure chronic ulcer of other part of left foot with fat layer exposed: Secondary | ICD-10-CM

## 2023-01-30 DIAGNOSIS — E0843 Diabetes mellitus due to underlying condition with diabetic autonomic (poly)neuropathy: Secondary | ICD-10-CM | POA: Diagnosis not present

## 2023-01-30 DIAGNOSIS — B351 Tinea unguium: Secondary | ICD-10-CM | POA: Diagnosis not present

## 2023-01-30 DIAGNOSIS — M79675 Pain in left toe(s): Secondary | ICD-10-CM | POA: Diagnosis not present

## 2023-01-30 NOTE — Progress Notes (Signed)
Chief Complaint  Patient presents with   Nail Problem    "Everything is okay but my big toenail fell off on my left foot."    Subjective:  72 y.o. female with PMHx of diabetes mellitus presenting to the office today for routine diabetic foot exam.  Patient also requesting new diabetic shoes and insoles.   Past Medical History:  Diagnosis Date   Anemia    Arthritis    Asthma    B12 deficiency    Back pain    Carpal tunnel syndrome    Chronic kidney disease    Constipation    Depression    Depressive disorder    Diabetes mellitus    Dyspnea    Fluid retention    Foot pain    GERD (gastroesophageal reflux disease)    Headache    History of hiatal hernia    Hyperlipidemia    Hypertension    IBS (irritable bowel syndrome)    Insomnia    Joint pain    Lumbago    Memory loss    Obesity    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)    Pneumonia    Rhinitis, allergic    Rosacea    Sleep apnea    SOB (shortness of breath)    Thyroid disease    Unspecified hearing loss    Unspecified hereditary and idiopathic peripheral neuropathy    Unspecified sleep apnea    Vitamin D deficiency     Past Surgical History:  Procedure Laterality Date   ABDOMINAL HYSTERECTOMY  1975   ANKLE SURGERY Left approx Jan 2018   APPENDECTOMY  1970   CATARACT EXTRACTION  01/2011   right   COLONOSCOPY WITH PROPOFOL N/A 08/19/2019   Procedure: COLONOSCOPY WITH PROPOFOL;  Surgeon: Wyline Mood, MD;  Location[facility] ENDOSCOPY;  Service: Gastroenterology;  Laterality: N/A;   eye lid surgery  2013   bilateral   FOOT SURGERY     LOWER EXTREMITY ANGIOGRAPHY Left 11/23/2020   Procedure: LOWER EXTREMITY ANGIOGRAPHY;  Surgeon: Renford Dills, MD;  Location: [facility] INVASIVE CV LAB;  Service: Cardiovascular;  Laterality: Left;   NECK SURGERY     SPINE SURGERY     TEE WITHOUT CARDIOVERSION N/A 08/16/2022   Procedure: TRANSESOPHAGEAL ECHOCARDIOGRAM (TEE);  Surgeon: Debbe Odea, MD;  Location: [facility] ORS;   Service: Cardiovascular;  Laterality: N/A;   TUBAL LIGATION     VAGINAL HYSTERECTOMY  1989    Allergies  Allergen Reactions   Codeine Other (See Comments)    "TRIPPED OUT"  DIDN'T LIKE THE WAY IT FELT   Atorvastatin     muscle pain   Hydrocodone     itching   Tramadol    Latex Rash   Zolpidem Other (See Comments)    Sleep walk     LT great toe 01/30/2023  Objective/Physical Exam General: The patient is alert and oriented x3 in no acute distress.  Dermatology:  Wound #1 noted to the left great toe measuring approximately 0.6 from 0.6 from 0.2 cm (LxWxD).   To the noted ulceration(s), there is no eschar. There is a moderate amount of slough, fibrin, and necrotic tissue noted. Granulation tissue and wound base is red. There is a minimal amount of serosanguineous drainage noted. There is no exposed bone muscle-tendon ligament or joint. There is no malodor. Periwound integrity is intact. Skin is warm, dry and supple bilateral lower extremities.  Vascular: Capillary refill WNL.  No edema or erythema.  Skin warm to touch  12/05/2021 ABI Findings:  +---------+------------------+-----+---------+--------+  Right   Rt Pressure (mmHg)IndexWaveform Comment   +---------+------------------+-----+---------+--------+  Brachial 147                                       +---------+------------------+-----+---------+--------+  ATA     119               0.81 biphasic           +---------+------------------+-----+---------+--------+  PTA     148               1.01 triphasic          +---------+------------------+-----+---------+--------+  Great Toe131               0.89 Normal             +---------+------------------+-----+---------+--------+   +---------+------------------+-----+---------+-------+  Left    Lt Pressure (mmHg)IndexWaveform Comment  +---------+------------------+-----+---------+-------+  Brachial 146                                       +---------+------------------+-----+---------+-------+  ATA     122               0.83 biphasic          +---------+------------------+-----+---------+-------+  PTA     144               0.98 triphasic         +---------+------------------+-----+---------+-------+  Great Toe162               1.10 Normal            +---------+------------------+-----+---------+-------+   +-------+-----------+-----------+------------+------------+  ABI/TBIToday's ABIToday's TBIPrevious ABIPrevious TBI  +-------+-----------+-----------+------------+------------+  Right 1.01       .89        .91         .68           +-------+-----------+-----------+------------+------------+  Left  .98        1.10       .88         1.06          +-------+-----------+-----------+------------+------------+  Bilateral ABIs appear increased compared to prior study on 11/04/2020.  Left TBIs appear essentially unchanged compared to prior study on  11/04/2020. Right TBIs appear increased compared to prior study on  11/04/2020.    Summary:  Right: Resting right ankle-brachial index is within normal range. No  evidence of significant right lower extremity arterial disease. The right  toe-brachial index is normal.  Left: Resting left ankle-brachial index is within normal range. No  evidence of significant left lower extremity arterial disease. The left  toe-brachial index is normal.   Neurological: Epicritic and protective threshold diminished bilaterally.   Musculoskeletal Exam: Prior amputation left fifth toe  Assessment: 1.  Ulcer left great toe secondary to diabetes mellitus 2. diabetes mellitus w/ peripheral neuropathy 3.  Pain due to onychomycosis of toenails both   Plan of Care:  1. Patient was evaluated.  Mechanical debridement of nails 1-5 bilateral was performed using a nail nipper without incident or bleeding 2. medically necessary excisional debridement including  subcutaneous tissue was performed using a tissue nipper and a chisel blade. Excisional debridement of all the necrotic nonviable tissue down to healthy bleeding viable tissue was performed with post-debridement measurements same  as pre-. 3. the wound was cleansed and dry sterile dressing applied. 4.  Appointment for diabetic shoes and custom molded diabetic insoles.  Order placed 5.  Advised against going barefoot.  Patient admits to going barefoot around the house majority of the day 6.  Return to clinic 4 weeks   Felecia Shelling, DPM [facility]  Dr. Felecia Shelling, DPM    [address] [place], [place] [address]                Office [phone]  Fax [phone]    Her

## 2023-02-10 DIAGNOSIS — I130 Hypertensive heart and chronic kidney disease with heart failure and stage 1 through stage 4 chronic kidney disease, or unspecified chronic kidney disease: Secondary | ICD-10-CM | POA: Diagnosis not present

## 2023-02-10 DIAGNOSIS — I5030 Unspecified diastolic (congestive) heart failure: Secondary | ICD-10-CM | POA: Diagnosis not present

## 2023-02-10 DIAGNOSIS — N1832 Chronic kidney disease, stage 3b: Secondary | ICD-10-CM | POA: Diagnosis not present

## 2023-02-10 DIAGNOSIS — E1122 Type 2 diabetes mellitus with diabetic chronic kidney disease: Secondary | ICD-10-CM | POA: Diagnosis not present

## 2023-02-14 NOTE — Progress Notes (Deleted)
Name: Summer [name]   MRN: [id number]    DOB: [birth year]   Date:02/14/2023       Progress Note  Subjective  Chief Complaint  Medication Reconciliation   HPI  *** Patient Active Problem List   Diagnosis Date Noted   Posterior reversible encephalopathy syndrome 08/21/2022   Seizure ([name]) 08/20/2022   Multiple falls 08/15/2022   Gouty arthritis 08/15/2022   Diabetic ulcer of left midfoot associated with diabetes mellitus due to underlying condition, limited to breakdown of skin ([name]) 08/15/2022   Bacteremia due to Streptococcus pneumoniae 08/12/2022   Metabolic acidosis 08/11/2022   Ulcer of left foot due to type 2 diabetes mellitus ([name]) 08/10/2022   Major depression in remission ([name]) 07/04/2022   Atherosclerosis of aorta ([name]) 07/04/2022   Insomnia due to other mental disorder 07/04/2022   Amputated toe of left foot ([name]) 07/04/2022   Chronic obstructive pulmonary disease ([name]) 01/28/2021   Diabetes mellitus ([name]) 05/24/2020   B12 deficiency 05/24/2020   Vitamin D deficiency 05/24/2020   Metatarsalgia of left foot 01/15/2020   Major depressive disorder, recurrent episode, moderate ([name]) 04/04/2019   Left arm weakness 04/03/2019   (HFpEF) heart failure with preserved ejection fraction ([name]) 08/31/2017   Moderate persistent asthma 08/31/2017   Hyperlipidemia associated with type 2 diabetes mellitus ([name]) 06/14/2017   Uncontrolled type 2 diabetes mellitus with hyperglycemia, with long-term current use of insulin ([name]) 08/18/2016   Charcot foot due to diabetes mellitus ([name]) 02/22/2016   Acquired abduction deformity of foot 07/12/2015   Osteoarthritis of subtalar joint 07/12/2015   Arthritis of foot, degenerative 07/12/2015   Carpal tunnel syndrome 04/17/2015   Chronic constipation 04/17/2015   Insomnia, persistent 04/17/2015   Stage 3b chronic kidney disease ([name]) 04/17/2015   Decreased exercise tolerance 04/17/2015   Diabetes mellitus with polyneuropathy ([name]) 04/17/2015    Gastroesophageal reflux disease without esophagitis 04/17/2015   Bursitis, trochanteric 04/17/2015   Cephalalgia 04/17/2015   Benign hypertension 04/17/2015   Adult hypothyroidism 04/17/2015   Hearing loss 04/17/2015   Chronic recurrent major depressive disorder ([name]) 04/17/2015   Neurogenic claudication 04/17/2015   Morbid obesity with BMI of 40.0-44.9, adult ([name]) 04/17/2015   Hypo-ovarianism 04/17/2015   Perennial allergic rhinitis with seasonal variation 04/17/2015   Acne erythematosa 04/17/2015   Dyskinesia, tardive 04/17/2015   Memory loss 04/17/2015   Impingement syndrome of shoulder 04/17/2015   Dermatitis, stasis 04/17/2015   Obstructive sleep apnea 05/14/2014   Shortness of breath on exertion 05/06/2014   Mixed hyperlipidemia 02/06/2012   LBP (low back pain) 09/16/2008    Past Surgical History:  Procedure Laterality Date   ABDOMINAL HYSTERECTOMY  1975   ANKLE SURGERY Left approx Jan 2018   APPENDECTOMY  1970   CATARACT EXTRACTION  01/2011   right   COLONOSCOPY WITH PROPOFOL N/A 08/19/2019   Procedure: COLONOSCOPY WITH PROPOFOL;  Surgeon: Wyline Mood, MD;  Location[facility] END[facility]COPY;  Service: Gastroenterology;  Laterality: N/A;   eye lid surgery  2013   bilateral   FOOT SURGERY     LOWER EXTREMITY ANGIOGRAPHY Left 11/23/2020   Procedure: LOWER EXTREMITY ANGIOGRAPHY;  Surgeon: Renford Dills, MD;  Location: [facility] INVASIVE CV LAB;  Service: Cardiovascular;  Laterality: Left;   NECK SURGERY     SPINE SURGERY     TEE WITHOUT CARDIOVERSION N/A 08/16/2022   Procedure: TRANSESOPHAGEAL ECHOCARDIOGRAM (TEE);  Surgeon: Debbe Odea, MD;  Location: [facility] ORS;  Service: Cardiovascular;  Laterality: N/A;   TUBAL LIGATION     VAGINAL  HYSTERECTOMY  1989    Family History  Problem Relation Age of Onset   Aneurysm Mother    Aortic aneurysm Mother    Hypertension Mother    Hyperlipidemia Mother    Heart disease Mother    Obesity Mother    Heart attack Maternal  Grandfather    Diabetes Maternal Grandfather     Social History   Tobacco Use   Smoking status: Never   Smokeless tobacco: Never  Substance Use Topics   Alcohol use: No    Alcohol/week: 0.0 standard drinks of alcohol     Current Outpatient Medications:    acetaminophen (TYLENOL 8 HOUR) 650 MG CR tablet, Take 1 tablet (650 mg total) by mouth every 8 (eight) hours as needed for pain., Disp: 90 tablet, Rfl: 2   albuterol (VENTOLIN HFA) 108 (90 Base) MCG/ACT inhaler, Inhale 2 puffs into the lungs every 6 (six) hours as needed for wheezing or shortness of breath., Disp: 8 g, Rfl: 2   allopurinol (ZYLOPRIM) 100 MG tablet, Take 0.5 tablets (50 mg total) by mouth daily., Disp: , Rfl:    amLODipine (NORVASC) 10 MG tablet, Take 1 tablet (10 mg total) by mouth daily. (Patient taking differently: Take 5 mg by mouth daily.), Disp: , Rfl:    ASPIRIN LOW DOSE 81 MG EC tablet, TAKE 1 TABLET (81 MG TOTAL) BY MOUTH DAILY., Disp: 30 tablet, Rfl: 0   B-D ULTRAFINE III SHORT PEN 31G X 8 MM MISC, SMARTSIG:1 Injection Daily, Disp: , Rfl:    Budeson-Glycopyrrol-Formoterol (BREZTRI AEROSPHERE) 160-9-4.8 MCG/ACT AERO, Inhale 2 puffs into the lungs in the morning and at bedtime., Disp: 5.9 g, Rfl: 0   buPROPion (WELLBUTRIN XL) 150 MG 24 hr tablet, Take 150 mg by mouth daily., Disp: , Rfl:    Cholecalciferol (VITAMIN D3) 25 MCG (1000 UT) CAPS, Take 1,000 Units by mouth., Disp: , Rfl:    Continuous Blood Gluc Sensor (FREESTYLE LIBRE 2 SENSOR) MISC, FOR GLUCOSE MONITORING, Disp: , Rfl:    Cyanocobalamin (VITAMIN B12 PO), Take 1 tablet by mouth daily., Disp: , Rfl:    dapagliflozin propanediol (FARXIGA) 10 MG TABS tablet, Take 10 mg by mouth daily., Disp: , Rfl:    diclofenac Sodium (VOLTAREN) 1 % GEL, Apply 2 g topically 2 (two) times daily., Disp: , Rfl:    ezetimibe (ZETIA) 10 MG tablet, Take 1 tablet (10 mg total) by mouth daily., Disp: 90 tablet, Rfl: 3   feeding supplement (ENSURE ENLIVE / ENSURE PLUS) LIQD,  Take 237 mLs by mouth 3 (three) times daily., Disp: 237 mL, Rfl: 12   furosemide (LASIX) 20 MG tablet, Take 1 tablet (20 mg total) by mouth daily., Disp: 90 tablet, Rfl: 1   levothyroxine (SYNTHROID) 25 MCG tablet, Take 1 tablet (25 mcg total) by mouth daily before breakfast. Two on Sunday, Disp: 100 tablet, Rfl: 1   metFORMIN (GLUCOPHAGE) 500 MG tablet, Take 2 tablets by mouth daily with breakfast., Disp: 90 tablet, Rfl: 0   metoprolol tartrate (LOPRESSOR) 50 MG tablet, Take 1 tablet (50 mg total) by mouth 2 (two) times daily., Disp: , Rfl:    Misc Natural Products (OSTEO BI-FLEX ADV JOINT SHIELD PO), Take by mouth 2 (two) times daily., Disp: , Rfl:    montelukast (SINGULAIR) 10 MG tablet, Take 1 tablet (10 mg total) by mouth daily., Disp: 90 tablet, Rfl: 0   Multiple Vitamin (MULTIVITAMIN WITH MINERALS) TABS tablet, Take 1 tablet by mouth daily., Disp: , Rfl:    omeprazole (PRILOSEC)  40 MG capsule, Take 1 capsule (40 mg total) by mouth daily., Disp: 90 capsule, Rfl: 1   Potassium 99 MG TABS, Take by mouth daily at 6 (six) AM., Disp: , Rfl:    pregabalin (LYRICA) 150 MG capsule, Take 1 capsule (150 mg total) by mouth 2 (two) times daily for 10 days., Disp: 20 capsule, Rfl: 0   Probiotic Product (PROBIOTIC-10 PO), Take by mouth., Disp: , Rfl:    RELION INSULIN SYRINGE 1ML/31G 31G X 5/16" 1 ML MISC, , Disp: , Rfl:    rosuvastatin (CRESTOR) 40 MG tablet, Take 1 tablet (40 mg total) by mouth daily., Disp: 90 tablet, Rfl: 1   sertraline (ZOLOFT) 100 MG tablet, Take 1.5 tablets (150 mg total) by mouth daily., Disp: 135 tablet, Rfl: 1   traZODone (DESYREL) 100 MG tablet, Take 1 tablet (100 mg total) by mouth at bedtime as needed for sleep. (Patient taking differently: Take 150 mg by mouth at bedtime as needed for sleep.), Disp: 90 tablet, Rfl: 1   TRESIBA FLEXTOUCH 200 UNIT/ML FlexTouch Pen, SMARTSIG:60 Unit(s) SUB-Q Daily, Disp: , Rfl:    VASCEPA 1 g capsule, Take 2 capsules (2 g total) by mouth 2 (two)  times daily., Disp: 120 capsule, Rfl: 5  Allergies  Allergen Reactions   Codeine Other (See Comments)    "TRIPPED OUT"  DIDN'T LIKE THE WAY IT FELT   Atorvastatin     muscle pain   Hydrocodone     itching   Tramadol    Latex Rash   Zolpidem Other (See Comments)    Sleep walk    I personally reviewed active problem list, medication list, allergies, family history, social history, health maintenance with the patient/caregiver today.   ROS  ***  Objective  There were no vitals filed for this visit.  There is no height or weight on file to calculate BMI.  Physical Exam ***   PHQ2/9:    01/29/2023    2:23 PM 01/15/2023   11:21 AM 07/04/2022    2:24 PM 06/30/2022    1:13 PM 04/04/2022    1:33 PM  Depression screen PHQ 2/9  Decreased Interest 3 3 0 0 2  Down, Depressed, Hopeless 0 0 0 0 1  PHQ - 2 Score 3 3 0 0 3  Altered sleeping 3 3 0 0 3  Tired, decreased energy 0 0 0 0 3  Change in appetite 0 0 0 0 2  Feeling bad or failure about yourself  0 0 0 0 2  Trouble concentrating 0 0 0 0 2  Moving slowly or fidgety/restless 0 0 0 0 0  Suicidal thoughts 0 0 0 0 0  PHQ-9 Score 6 6 0 0 15  Difficult doing work/chores Not difficult at all   Not difficult at all Very difficult    phq 9 is {gen pos [id number]}   Fall Risk:    01/29/2023    2:23 PM 01/15/2023   11:21 AM 07/04/2022    2:24 PM 06/30/2022    1:11 PM 04/04/2022    1:22 PM  Fall Risk   Falls in the past year? 0 0 1 1 0  Number falls in past yr:  0 1 1   Injury with Fall?  0 1 1   Risk for fall due to : Impaired balance/gait Impaired balance/gait;Impaired mobility Impaired balance/gait;Impaired mobility;History of fall(s) Impaired balance/gait;Impaired mobility Impaired balance/gait  Follow up Falls prevention discussed Falls prevention discussed Falls prevention discussed;Education provided;Falls evaluation completed  Falls prevention discussed;Education provided;Falls evaluation completed      Functional  Status Survey:      Assessment & Plan  *** There are no diagnoses linked to this encounter.

## 2023-02-15 ENCOUNTER — Ambulatory Visit: Payer: No Typology Code available for payment source | Admitting: Family Medicine

## 2023-02-18 DIAGNOSIS — I5030 Unspecified diastolic (congestive) heart failure: Secondary | ICD-10-CM | POA: Diagnosis not present

## 2023-02-18 DIAGNOSIS — I132 Hypertensive heart and chronic kidney disease with heart failure and with stage 5 chronic kidney disease, or end stage renal disease: Secondary | ICD-10-CM | POA: Diagnosis not present

## 2023-02-18 DIAGNOSIS — N186 End stage renal disease: Secondary | ICD-10-CM | POA: Diagnosis not present

## 2023-02-18 DIAGNOSIS — E1122 Type 2 diabetes mellitus with diabetic chronic kidney disease: Secondary | ICD-10-CM | POA: Diagnosis not present

## 2023-02-19 ENCOUNTER — Other Ambulatory Visit: Payer: Self-pay | Admitting: Family Medicine

## 2023-02-19 DIAGNOSIS — F331 Major depressive disorder, recurrent, moderate: Secondary | ICD-10-CM

## 2023-02-19 DIAGNOSIS — K219 Gastro-esophageal reflux disease without esophagitis: Secondary | ICD-10-CM

## 2023-02-19 DIAGNOSIS — I10 Essential (primary) hypertension: Secondary | ICD-10-CM

## 2023-02-19 NOTE — Telephone Encounter (Signed)
Medication Refill - Medication:  Omeprazole 40 mg Montelukast 10 mg Amlodipine 5 mg  Bupropion 150 mg  Has the patient contacted their pharmacy? Yes.   (Agent: If no, request that the patient contact the pharmacy for the refill. If patient does not wish to contact the pharmacy document the reason why and proceed with request.) (Agent: If yes, when and what did the pharmacy advise?)  Preferred Pharmacy (with phone number or street name): [facility] Has the patient been seen for an appointment in the last year OR does the patient have an upcoming appointment? Yes.    Agent: Please be advised that RX refills may take up to 3 business days. We ask that you follow-up with your pharmacy.

## 2023-02-20 ENCOUNTER — Other Ambulatory Visit: Payer: Self-pay

## 2023-02-20 DIAGNOSIS — E1122 Type 2 diabetes mellitus with diabetic chronic kidney disease: Secondary | ICD-10-CM | POA: Diagnosis not present

## 2023-02-20 DIAGNOSIS — N1832 Chronic kidney disease, stage 3b: Secondary | ICD-10-CM | POA: Diagnosis not present

## 2023-02-20 DIAGNOSIS — I10 Essential (primary) hypertension: Secondary | ICD-10-CM | POA: Diagnosis not present

## 2023-02-20 DIAGNOSIS — D631 Anemia in chronic kidney disease: Secondary | ICD-10-CM | POA: Diagnosis not present

## 2023-02-20 DIAGNOSIS — R809 Proteinuria, unspecified: Secondary | ICD-10-CM | POA: Diagnosis not present

## 2023-02-20 NOTE — Telephone Encounter (Signed)
Requested medication (s) are due for refill today: yes  Requested medication (s) are on the active medication list: yes  Last refill:  multiple dates  Future visit scheduled: yes  Notes to clinic:  Unable to refill per protocol, last refill by another provider/ historical provider. Routing for approval.     Requested Prescriptions  Pending Prescriptions Disp Refills   omeprazole (PRILOSEC) 40 MG capsule 90 capsule 1    Sig: Take 1 capsule (40 mg total) by mouth daily.     Gastroenterology: Proton Pump Inhibitors Passed - 02/19/2023  4:05 PM      Passed - Valid encounter within last 12 months    Recent Outpatient Visits           3 weeks ago Chronic heart failure with preserved ejection fraction [facility])   [place] [facility] Al[facility] Cory, MD   1 month ago Atherosclerosis of aorta [facility])   [place] [facility] Alba Cory, MD   7 months ago Dyslipidemia associated with type 2 diabetes mellitus [facility])   [place] [facility] Alba Cory, MD   7 months ago Abrasion   [facility] Margarita Mail, DO   10 months ago Morbid obesity [facility])   [place] [facility] Cory, MD       Future Appointments             In 3 weeks End, Cristal Deer, MD [facility] at [place]   In 1 month Alba Cory, MD [facility]   In 2 months Erin Fulling, MD [facility] [place] [facility] at [place]             montelukast (SINGULAIR) 10 MG tablet 90 tablet 0    Sig: Take 1 tablet (10 mg total) by mouth daily.     Pulmonology:  Leukotriene Inhibitors Passed - 02/19/2023  4:05 PM      Passed - Valid encounter within last 12 months    Recent Outpatient Visits           3 weeks ago Chronic heart failure with preserved ejection fraction [facility])   [place] [facility] Al[facility] Cory, MD   1 month ago  Atherosclerosis of aorta [facility])   [place] [name] Alba Cory, MD   7 months ago Dyslipidemia associated with type 2 diabetes mellitus [facility])   [place] [facility] Al[facility] Cory, MD   7 months ago Abrasion   [facility] Margarita Mail, DO   10 months ago Morbid obesity [facility])   [place] [facility] Alba Cory, MD       Future Appointments             In 3 weeks End, Cristal Deer, MD [facility] at [place]   In 1 month Alba Cory, MD [facility], [facility]   In 2 months Erin Fulling, MD [facility] [place] [facility] at [facility]             amLODipine (NORVASC) 10 MG tablet      Sig: Take 1 tablet (10 mg total) by mouth daily.     Cardiovascular: Calcium Channel Blockers 2 Passed - 02/19/2023  4:05 PM      Passed - Last BP in normal range    BP Readings from Last 1 Encounters:  01/30/23 (!) 122/51         Passed -  Last Heart Rate in normal range    Pulse Readings from Last 1 Encounters:  01/30/23 (!) 57         Passed - Valid encounter within last 6 months    Recent Outpatient Visits           3 weeks ago Chronic heart failure with preserved ejection fraction [facility])   [place] [facility] Al[facility] Cory, MD   1 month ago Atherosclerosis of aorta [facility])   [place] [facility] Al[facility] Cory, MD   7 months ago Dyslipidemia associated with type 2 diabetes mellitus [facility])   [place] [facility] Alba Cory, MD   7 months ago Abrasion   [facility] Margarita Mail, DO   10 months ago Morbid obesity [facility])   [place] [facility] Alba Cory, MD       Future Appointments             In 3 weeks End, Cristal Deer, MD [facility] at [place]   In 1 month Alba Cory, MD [facility],  [facility]   In 2 months Erin Fulling, MD [facility] [place] [facility] at [facility]             buPROPion (WELLBUTRIN XL) 150 MG 24 hr tablet      Sig: Take 1 tablet (150 mg total) by mouth daily.     Psychiatry: Antidepressants - bupropion Failed - 02/19/2023  4:05 PM      Failed - Cr in normal range and within 360 days    Creat  Date Value Ref Range Status  01/15/2023 2.07 (H) 0.60 - 1.00 mg/dL Final   Creatinine, Urine  Date Value Ref Range Status  06/06/2022 16.8  Final         Passed - AST in normal range and within 360 days    AST  Date Value Ref Range Status  01/15/2023 15 10 - 35 U/L Final         Passed - ALT in normal range and within 360 days    ALT  Date Value Ref Range Status  01/15/2023 10 6 - 29 U/L Final         Passed - Completed PHQ-2 or PHQ-9 in the last 360 days      Passed - Last BP in normal range    BP Readings from Last 1 Encounters:  01/30/23 (!) 122/51         Passed - Valid encounter within last 6 months    Recent Outpatient Visits           3 weeks ago Chronic heart failure with preserved ejection fraction [facility])   [place] [facility] Alba Cory, MD   1 month ago Atherosclerosis of aorta [facility])   [facility] Al[facility] Cory, MD   7 months ago Dyslipidemia associated with type 2 diabetes mellitus [facility])   [place] [facility] Al[facility] Cory, MD   7 months ago Abrasion   [facility] Marg[facility]ita Mail, DO   10 months ago Morbid obesity [facility])   [place] [facility] Alba Cory, MD       Future Appointments             In 3 weeks End, Cristal Deer, MD [facility] at [place]   In 1 month Alba Cory, MD [facility], [facility]   In  2 months Erin Fulling, MD [facility] at [facility]

## 2023-02-21 ENCOUNTER — Telehealth: Payer: Self-pay

## 2023-02-21 NOTE — Telephone Encounter (Signed)
Called patient's pharmacy to get active medication list with prescribing providers faxed to [place]. When reviewing their list to ours a lot of medications did not match. Dosages are different, directions, and we have some they don't/they have some we don't. I called patient to verify if still taking certain medications. She could not tell me what she's taking. "If it's in the pack that's what I take" I informed her they do not match and there are medications she needs to be taking that aren't on the pillpack list. She couldn't remember anything about her medications or what's she taking. I stressed the importance of bringing all medications she is currently taking. I informed her she never brings them and the med lists aren't correct. Patient verbalized understanding to bring all of current medications she is taking to her appointment.

## 2023-02-21 NOTE — Telephone Encounter (Signed)
Contacted pharmacy. They stated they will fax a list of current medications they are filling with ordering provider name.

## 2023-02-22 ENCOUNTER — Ambulatory Visit: Payer: No Typology Code available for payment source | Admitting: Internal Medicine

## 2023-02-23 MED ORDER — BUPROPION HCL ER (XL) 150 MG PO TB24: 150.0000 mg | ORAL_TABLET | Freq: Every day | ORAL | 0 refills | Status: DC

## 2023-02-23 MED ORDER — OMEPRAZOLE 40 MG PO CPDR: 40.0000 mg | DELAYED_RELEASE_CAPSULE | Freq: Every day | ORAL | 0 refills | Status: DC

## 2023-02-23 MED ORDER — AMLODIPINE BESYLATE 5 MG PO TABS: 5.0000 mg | ORAL_TABLET | Freq: Every day | ORAL | 0 refills | Status: DC

## 2023-03-01 DIAGNOSIS — Z794 Long term (current) use of insulin: Secondary | ICD-10-CM | POA: Diagnosis not present

## 2023-03-01 DIAGNOSIS — E1142 Type 2 diabetes mellitus with diabetic polyneuropathy: Secondary | ICD-10-CM | POA: Diagnosis not present

## 2023-03-02 ENCOUNTER — Ambulatory Visit: Payer: No Typology Code available for payment source | Admitting: Podiatry

## 2023-03-14 ENCOUNTER — Telehealth: Payer: Self-pay | Admitting: Family Medicine

## 2023-03-14 NOTE — Telephone Encounter (Signed)
Patient does not take this medication and it is on her allergy list. Notified pharmacy.

## 2023-03-14 NOTE — Telephone Encounter (Signed)
atorvastatin 20mg  and atorvastatin 40mg . Are not on current list. Josie from [facility] called to inquire which dose is patient taking. Routing for review. Please advise [phone].

## 2023-03-14 NOTE — Telephone Encounter (Signed)
Summer [name] with [facility] is calling to see if pt is still taking both dosages of medication.  On pt chart it is showing atorvastatin 20mg  and atorvastatin 40mg .   Please advise: [phone]  please ask to be transferred to inbound pharmacy team and anyone will be able to assist.

## 2023-03-16 ENCOUNTER — Encounter: Payer: Self-pay | Admitting: Internal Medicine

## 2023-03-16 ENCOUNTER — Ambulatory Visit: Payer: No Typology Code available for payment source | Attending: Internal Medicine | Admitting: Internal Medicine

## 2023-03-16 VITALS — BP 100/56 | HR 55 | Ht 62.0 in | Wt 211.0 lb

## 2023-03-16 DIAGNOSIS — E785 Hyperlipidemia, unspecified: Secondary | ICD-10-CM | POA: Diagnosis not present

## 2023-03-16 DIAGNOSIS — Z7984 Long term (current) use of oral hypoglycemic drugs: Secondary | ICD-10-CM

## 2023-03-16 DIAGNOSIS — I739 Peripheral vascular disease, unspecified: Secondary | ICD-10-CM | POA: Diagnosis not present

## 2023-03-16 DIAGNOSIS — I440 Atrioventricular block, first degree: Secondary | ICD-10-CM | POA: Insufficient documentation

## 2023-03-16 DIAGNOSIS — I2510 Atherosclerotic heart disease of native coronary artery without angina pectoris: Secondary | ICD-10-CM | POA: Diagnosis not present

## 2023-03-16 DIAGNOSIS — E1169 Type 2 diabetes mellitus with other specified complication: Secondary | ICD-10-CM

## 2023-03-16 DIAGNOSIS — N184 Chronic kidney disease, stage 4 (severe): Secondary | ICD-10-CM | POA: Diagnosis not present

## 2023-03-16 DIAGNOSIS — I5032 Chronic diastolic (congestive) heart failure: Secondary | ICD-10-CM

## 2023-03-16 DIAGNOSIS — I10 Essential (primary) hypertension: Secondary | ICD-10-CM

## 2023-03-16 MED ORDER — METOPROLOL TARTRATE 50 MG PO TABS: 25.0000 mg | ORAL_TABLET | Freq: Two times a day (BID) | ORAL | Status: DC

## 2023-03-16 NOTE — Patient Instructions (Signed)
Medication Instructions:  Decrease Metoprolol to 25 mg twice a day.  *If you need a refill on your cardiac medications before your next appointment, please call your pharmacy*   Follow-Up: At [facility], you and your health needs are our priority.  As part of our continuing mission to provide you with exceptional heart care, we have created designated Provider Care Teams.  These Care Teams include your primary Cardiologist (physician) and Advanced Practice Providers (APPs -  Physician Assistants and Nurse Practitioners) who all work together to provide you with the care you need, when you need it.  We recommend signing up for the patient portal called "MyChart".  Sign up information is provided on this After Visit Summary.  MyChart is used to connect with patients for Virtual Visits (Telemedicine).  Patients are able to view lab/test results, encounter notes, upcoming appointments, etc.  Non-urgent messages can be sent to your provider as well.   To learn more about what you can do with MyChart, go to [email or web address].    Your next appointment:   3 month(s)  Provider:   You may see Yvonne Kendall, MD or one of the following Advanced Practice Providers on your designated Care Team:   Nicolasa Ducking, NP Eula Listen, PA-C Cadence Fransico Michael, PA-C Charlsie Quest, NP

## 2023-03-16 NOTE — Progress Notes (Signed)
Cardiology Office Note:  .   Date:  03/16/2023  ID:  Summer [name], DOB [birth year], MRN [id number] PCP: Alba Cory, MD  [facility] Providers Cardiologist:  Yvonne Kendall, MD     History of Present Illness: .   Summer [name] is a 72 y.o. female history of chronic HFpEF, hyperlipidemia, type 2 diabetes mellitus complicated by peripheral neuropathy and Charcot foot, asthma, obstructive sleep apnea, and morbid obesity, who presents for follow-up of shortness of breath.  She was last seen in 02/2022 by Cadence Furth, PA, at which time she was most concerned about a cough affecting her voice.  She also noted chest wall pain from coughing.  Chronic atypical chest pain unrelated to cough as well as chronic dyspnea were unchanged from prior visits.  No medication changes or additional testing were pursued.  She declined trial of CPAP despite OSA diagnosis.  She was hospitalized in 08/2022 with Streptococcus pneumonia bacteremia.  TEE was negative for endocarditis.  Hospital course was complicated by seizure and MRI findings consistent with PRES.  Today, Summer [name] reports that she is feeling fairly well.  She has been back home since early May.  She is ambulating with a walker.  She does not have any wounds on her legs.  She denies falls.  She has mild shortness of breath with activity for which she intermittently uses her inhaler.  She denies orthopnea and edema as well as chest pain, palpitations, and lightheadedness.  ROS: See HPI  Studies Reviewed: Marland Kitchen   EKG Interpretation Date/Time:  Friday March 16 2023 08:16:19 EDT Ventricular Rate:  55 PR Interval:  218 QRS Duration:  84 QT Interval:  392 QTC Calculation: 375 R Axis:   -1  Text Interpretation: Sinus bradycardia with 1st degree A-V block Minimal voltage criteria for LVH, may be normal variant ( R in aVL ) Inferior infarct , age undetermined Possible Anterior infarct , age undetermined When compared with ECG of 24-Feb-2022 PR interval has  increased Criteria for Inferior infarct are now present Confirmed by Joee Iovine [id number]) on 03/16/2023 9:06:13 AM    TEE (08/16/2022): Normal LVEF (60-65%).  Normal RV size and function.  No left atrial appendage/left atrial thrombus.  Normal RV size without thrombus.  No pericardial effusion.  Normal mitral valve with trivial regurgitation.  Normal tricuspid valve.  Normal aortic valve.  Normal pulmonic valve.  No atrial level shunt by color Doppler.  No vegetation identified.  TTE (08/14/2022): Normal LV size with moderate asymmetric LVH of the septum.  LVEF 60-65%.  Indeterminate diastolic parameters.  Normal RV size and function.  Normal biatrial size.  Mild thickening and moderate calcification of the mitral valve leaflets.  Moderate mitral annular calcification.  No mitral regurgitation or stenosis.  Normal tricuspid valve.  Aortic sclerosis without stenosis or regurgitation.  Myocardial PET/CT (07/23/2019, [facility]): Low risk study without ischemia or scar.  LVEF greater than 65%.  Mild coronary artery calcification noted.  Mitral annular calcification present.  Risk Assessment/Calculations:           Physical Exam:   VS:  BP (!) 100/56 (BP Location: Right Arm, Patient Position: Sitting, Cuff Size: Normal)   Pulse (!) 55   Ht 5\' 2"  (1.575 m)   Wt 211 lb (95.7 kg)   SpO2 97%   BMI 38.59 kg/m    Wt Readings from Last 3 Encounters:  03/16/23 211 lb (95.7 kg)  01/29/23 215 lb 14.4 oz (97.9 kg)  01/15/23 216 lb (98  kg)    General:  NAD. Neck: No JVD or HJR. Lungs: Clear to auscultation bilaterally without wheezes or crackles. Heart: Regular rate and rhythm without murmurs, rubs, or gallops. Abdomen: Soft, nontender, nondistended. Extremities: No lower extremity edema.  ASSESSMENT AND PLAN: .    Chronic HFpEF: Summer [name] has stable exertional dyspnea but appears grossly euvolemic on exam.  Her weight is down about 5 pounds since May.  We will continue her current diuretic dose of  furosemide 20 mg daily, though I am not sure this is having much effect in the setting of her underlying renal disease.  Continue dapagliflozin for chronic HFrEF therapy and glycemic control.  Coronary artery calcification: Summer [name] does not have any chest pain.  EKG today shows new Q waves in lead III.  However, TTE/TEE in 08/2022 did not show any obvious wall motion abnormalities.  Additionally, myocardial PET/CT at [facility] in 2020 was without ischemia or scar.  While she is certainly at risk for coronary artery disease, given the lack of symptoms and comorbidities, we will defer additional workup at this time.  Continue medical therapy with aggressive lipid and glycemic control to prevent progression of disease.  Peripheral arterial disease: Summer [name] reports that her foot wounds have healed.  Prior angiograms by vascular surgery have shown predominantly runoff disease, which we will continue to manage medically.  First-degree AV block: PR interval has lengthened over the last year.  Given mild sinus bradycardia and borderline low blood pressure along with first-degree AV block, we have agreed to decrease metoprolol to tartrate to 25 mg twice daily.  We will have Ms. Baudo follow-up in about 3 months with repeat EKG to reassess her heart rate and PR interval.  Hyperlipidemia associated with type 2 diabetes mellitus: Lipids well-controlled on last check.  Continue current regimen of rosuvastatin, ezetimibe, and icosapent ethyl.  Repeat lipid panel due this fall; will defer this as well as ongoing management of DM 2 doctors [place] and [place].  Hypertension: Blood pressure borderline low today, albeit asymptomatic.  We will decrease metoprolol today.  Continue current dose of amlodipine for the time being.  Chronic kidney disease stage IV: Creatinine now trending around 2.  Continue follow-up with nephrology and avoidance of nephrotoxic agents.    Dispo: Return to clinic in 3 months with EKG at that  time to reassess first-degree AV block.  Signed, Yvonne Kendall, MD

## 2023-03-19 ENCOUNTER — Ambulatory Visit
Admission: RE | Admit: 2023-03-19 | Discharge: 2023-03-19 | Disposition: A | Discharge status: Home / Self Care | Payer: No Typology Code available for payment source | Source: Ambulatory Visit | Attending: Family Medicine | Admitting: Family Medicine

## 2023-03-19 DIAGNOSIS — Z1231 Encounter for screening mammogram for malignant neoplasm of breast: Secondary | ICD-10-CM | POA: Diagnosis not present

## 2023-03-21 ENCOUNTER — Telehealth: Payer: Self-pay | Admitting: Family Medicine

## 2023-03-21 ENCOUNTER — Telehealth: Payer: Self-pay | Admitting: Internal Medicine

## 2023-03-21 MED ORDER — METOPROLOL TARTRATE 50 MG PO TABS: 25.0000 mg | ORAL_TABLET | Freq: Two times a day (BID) | ORAL | Status: DC

## 2023-03-21 NOTE — Telephone Encounter (Signed)
*  STAT* If patient is at the pharmacy, call can be transferred to refill team.   1. Which medications need to be refilled? (please list name of each medication and dose if known)   metoprolol tartrate (LOPRESSOR) 50 MG tablet   2. Which pharmacy/location (including street and city if local pharmacy) is medication to be sent to?  [facility] by [facility], [place] - [address]   3. Do they need a 30 day or 90 day supply?   90 day  Patient stated she still has some medication left but her dosage was increased and she will need a new prescription sent to [facility].

## 2023-03-21 NOTE — Telephone Encounter (Signed)
Medication Refill - Medication: montelukast (SINGULAIR) 10 MG tablet  levothyroxine (SYNTHROID) 25 MCG tablet  Has the patient contacted their pharmacy? Yes.   (Agent: If no, request that the patient contact the pharmacy for the refill. If patient does not wish to contact the pharmacy document the reason why and proceed with request.) (Agent: If yes, when and what did the pharmacy advise?)  Preferred Pharmacy (with phone number or street name):   [facility] - [place], [place] - [address]  [address] [place] [address]  Phone: [phone] Fax: [phone]   Has the patient been seen for an appointment in the last year OR does the patient have an upcoming appointment? Yes.   Agent: Please be advised that RX refills may take up to 3 business days. We ask that you follow-up with your pharmacy.

## 2023-03-23 ENCOUNTER — Encounter: Payer: Self-pay | Admitting: Podiatry

## 2023-03-23 ENCOUNTER — Ambulatory Visit: Payer: Self-pay

## 2023-03-23 ENCOUNTER — Ambulatory Visit: Payer: No Typology Code available for payment source | Admitting: Podiatry

## 2023-03-23 ENCOUNTER — Telehealth: Payer: Self-pay | Admitting: Family Medicine

## 2023-03-23 VITALS — BP 126/50 | HR 66

## 2023-03-23 DIAGNOSIS — B351 Tinea unguium: Secondary | ICD-10-CM | POA: Diagnosis not present

## 2023-03-23 DIAGNOSIS — E0843 Diabetes mellitus due to underlying condition with diabetic autonomic (poly)neuropathy: Secondary | ICD-10-CM

## 2023-03-23 DIAGNOSIS — M79675 Pain in left toe(s): Secondary | ICD-10-CM | POA: Diagnosis not present

## 2023-03-23 DIAGNOSIS — M79674 Pain in right toe(s): Secondary | ICD-10-CM

## 2023-03-23 DIAGNOSIS — L97522 Non-pressure chronic ulcer of other part of left foot with fat layer exposed: Secondary | ICD-10-CM | POA: Diagnosis not present

## 2023-03-23 NOTE — Telephone Encounter (Signed)
Spoke to the patient and patient requested that I call [facility] to verify the directions for the Metoprolol order. Spoke to [employer]. Clarified the order for Metoprolol as written. Metoprolol 50 mg Take 0.5 tablet twice daily.  Summary: medication dosage clarification   Francena Hanly from [facility] called stated patient stated the provider said to take only 1 tablet by mouth daily however the instructions says to take 1 tablet 2x daily. Francena Hanly needs clarification on the script for metoprolol tartrate (LOPRESSOR) 50 MG tablet.  Please contact her at [phone]  ----- Message from Avery T sent at 03/23/2023  1:41 PM EDT ----- Francena Hanly from [facility] called stated patient stated the provider said to take only 1 tablet by mouth daily however the instructions says to take 1 tablet 2x daily. Francena Hanly needs clarification on the script for metoprolol tartrate (LOPRESSOR) 50 MG tablet

## 2023-03-23 NOTE — Progress Notes (Signed)
Chief Complaint  Patient presents with   Wound Check    "My feet are good."    Subjective:  73 y.o. female with PMHx of diabetes mellitus presenting to the office today for follow-up evaluation of an ulcer to the plantar aspect of the left great toe and routine footcare.  Patient states that she no longer goes barefoot around the house.  She wears house slippers.  She has been applying a Band-Aid to the left great toe.  Presenting for further treatment and evaluation   Past Medical History:  Diagnosis Date   Anemia    Arthritis    Asthma    B12 deficiency    Back pain    Carpal tunnel syndrome    Chronic kidney disease    Constipation    Depression    Depressive disorder    Diabetes mellitus    Dyspnea    Fluid retention    Foot pain    GERD (gastroesophageal reflux disease)    Headache    History of hiatal hernia    Hyperlipidemia    Hypertension    IBS (irritable bowel syndrome)    Insomnia    Joint pain    Lumbago    Memory loss    Obesity    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)    Pneumonia    Rhinitis, allergic    Rosacea    Sleep apnea    SOB (shortness of breath)    Thyroid disease    Unspecified hearing loss    Unspecified hereditary and idiopathic peripheral neuropathy    Unspecified sleep apnea    Vitamin D deficiency     Past Surgical History:  Procedure Laterality Date   ABDOMINAL HYSTERECTOMY  1975   ANKLE SURGERY Left approx Jan 2018   APPENDECTOMY  1970   CATARACT EXTRACTION  01/2011   right   COLONOSCOPY WITH PROPOFOL N/A 08/19/2019   Procedure: COLONOSCOPY WITH PROPOFOL;  Surgeon: Wyline Mood, MD;  Location: [facility] ENDOSCOPY;  Service: Gastroenterology;  Laterality: N/A;   eye lid surgery  2013   bilateral   FOOT SURGERY     LOWER EXTREMITY ANGIOGRAPHY Left 11/23/2020   Procedure: LOWER EXTREMITY ANGIOGRAPHY;  Surgeon: Renford Dills, MD;  Location: [facility] INVASIVE CV LAB;  Service: Cardiovascular;  Laterality: Left;   NECK SURGERY     SPINE  SURGERY     TEE WITHOUT CARDIOVERSION N/A 08/16/2022   Procedure: TRANSESOPHAGEAL ECHOCARDIOGRAM (TEE);  Surgeon: Debbe Odea, MD;  Location: [facility] ORS;  Service: Cardiovascular;  Laterality: N/A;   TUBAL LIGATION     VAGINAL HYSTERECTOMY  1989    Allergies  Allergen Reactions   Codeine Other (See Comments)    "TRIPPED OUT"  DIDN'T LIKE THE WAY IT FELT   Atorvastatin     muscle pain   Hydrocodone     itching   Tramadol    Latex Rash   Zolpidem Other (See Comments)    Sleep walk     LT great toe 01/30/2023  Objective/Physical Exam General: The patient is alert and oriented x3 in no acute distress.  Dermatology:  Overall improvement.  Wound #1 noted to the left great toe measuring approximately 0.4 x 0.4 x 0.2 cm (LxWxD).   To the noted ulceration(s), there is no eschar. There is a moderate amount of slough, fibrin, and necrotic tissue noted. Granulation tissue and wound base is red. There is a minimal amount of serosanguineous drainage noted. There is no exposed bone muscle-tendon ligament  or joint. There is no malodor. Periwound integrity is intact. Skin is warm, dry and supple bilateral lower extremities.  Vascular: Capillary refill WNL.  No edema or erythema.  Skin warm to touch 12/05/2021 ABI Findings:  +---------+------------------+-----+---------+--------+  Right   Rt Pressure (mmHg)IndexWaveform Comment   +---------+------------------+-----+---------+--------+  Brachial 147                                       +---------+------------------+-----+---------+--------+  ATA     119               0.81 biphasic           +---------+------------------+-----+---------+--------+  PTA     148               1.01 triphasic          +---------+------------------+-----+---------+--------+  Great Toe131               0.89 Normal             +---------+------------------+-----+---------+--------+    +---------+------------------+-----+---------+-------+  Left    Lt Pressure (mmHg)IndexWaveform Comment  +---------+------------------+-----+---------+-------+  Brachial 146                                      +---------+------------------+-----+---------+-------+  ATA     122               0.83 biphasic          +---------+------------------+-----+---------+-------+  PTA     144               0.98 triphasic         +---------+------------------+-----+---------+-------+  Great Toe162               1.10 Normal            +---------+------------------+-----+---------+-------+   +-------+-----------+-----------+------------+------------+  ABI/TBIToday's ABIToday's TBIPrevious ABIPrevious TBI  +-------+-----------+-----------+------------+------------+  Right 1.01       .89        .91         .68           +-------+-----------+-----------+------------+------------+  Left  .98        1.10       .88         1.06          +-------+-----------+-----------+------------+------------+  Bilateral ABIs appear increased compared to prior study on 11/04/2020.  Left TBIs appear essentially unchanged compared to prior study on  11/04/2020. Right TBIs appear increased compared to prior study on  11/04/2020.    Summary:  Right: Resting right ankle-brachial index is within normal range. No  evidence of significant right lower extremity arterial disease. The right  toe-brachial index is normal.  Left: Resting left ankle-brachial index is within normal range. No  evidence of significant left lower extremity arterial disease. The left  toe-brachial index is normal.   Neurological: Light touch and protective threshold absent bilateral  Musculoskeletal Exam: Prior amputation left fifth toe  Assessment: 1.  Ulcer left great toe secondary to diabetes mellitus 2. diabetes mellitus w/ peripheral neuropathy 3.  Pain due to onychomycosis of toenails  both   Plan of Care:  -Patient was evaluated.  Mechanical debridement of nails 1-5 bilateral was performed using a nail nipper without incident or bleeding -Medically necessary excisional debridement including  subcutaneous tissue was performed using a tissue nipper and a chisel blade. Excisional debridement of all the necrotic nonviable tissue down to healthy bleeding viable tissue was performed with post-debridement measurements same as pre-. -Antibiotic ointment and a Band-Aid applied -Continue to advise against going barefoot -Return to clinic 3 months routine footcare and to schedule the patient for possible diabetic shoes and custom molded Plastizote insoles   Felecia Shelling, DPM [facility]  Dr. Felecia Shelling, DPM    [address] [place], [place] [address]                Office [phone]  Fax [phone]    Her

## 2023-03-26 NOTE — Progress Notes (Unsigned)
Name: Summer [name]   MRN: [id number]    DOB: November 27, 1950   Date:03/27/2023       Progress Note  Subjective  Chief Complaint  Follow Up  HPI  Hypothyroidism: still taking levothyroxine 25 mcg daily, last TSH done in April was 2.45 , no change in bowel movements, feeling less tired lately   COPD: from second hand smoking, last visit with Dr. Belia Heman was 10/2021 and gave her Brezti. . She has SOB with activity that has been chronic  She continues to have a daily cough. She also has a history of asthma and takes singulair. She will see him next month   Morbid Obesity/Malnutrition : BMI is dropping, weight is 238 lbs but was hospitalized in Dec with sepsis and lost a lot of weight, down to 215.9  lbs, she is now trying to keep the weight lower and is down to 211 lbs   MDD- recurrent and moderate: she is currently only taking sertraline and bupropion , she used to see a psychiatrist but lost to follow up. Discussed adding vraylar and she is willing to try it but worried about the cost    Diabetes type II insulin requiring  : she is under the care of Endocrinologist, Dr. Gershon Crane ,lats A1C was 7.16 May 2022 and down to 7 % in May 2024   She has dyslipidemia, obesity, CKI, and HTN associated with DM. She is now on [place] from assistance program, Metformin and [place]. Occasionally has hypoglycemia but states glucose at home has been around 130's range  . She has charcot's foot and history of amputation 5th toe left due to osteomyelitis . She states foot ulcer resolved, under the care of Dr. Logan Bores   CKI stage 3b:  She is on ARB and Farxiga , bp is controlled, under the care of nephrologist - Dr. Cherylann Ratel. She has good urine output and no pruritis Unchanged   Heart failure with preserved ejection fracture : EF 65 % but has mild increase in left ventricular wall thickness . She was recently by Dr. Okey Dupre and lopressor dose was changed to half pill bid, but she is skipping am dose and taking one at night, explained  it is not a 24hour medication and must take as directed. She states feeling better since she stopped taking beta blocker in am. She is also on furosemide, SGL-2 agonist and is doing well. She denies orthopnea, she has sob with activity that is likely multifactorial   OSA: she is unable to wear her CPAP, no longer on modafinil, unchanged   HTN: BP is still towards the low end of normal, but feeling better with lower dose of metoprolol   Abnormal MRI brain/ Small vessel disease :  discussed results with patient during her visit , continue aggressive control of DM, statin therapy and monitor.   Chronic bilateral knee pain: referral placed for [facility] last year but they were unable to reach her. We are giving her the number so she can call them directly   Patient Active Problem List   Diagnosis Date Noted   Coronary artery disease involving native coronary artery of native heart without angina pectoris 03/16/2023   First degree AV block 03/16/2023   CKD (chronic kidney disease) stage 4, GFR 15-29 ml/min ([name]) 03/16/2023   Posterior reversible encephalopathy syndrome 08/21/2022   Seizure ([name]) 08/20/2022   Multiple falls 08/15/2022   Gouty arthritis 08/15/2022   Diabetic ulcer of left midfoot associated with diabetes mellitus due to underlying  condition, limited to breakdown of skin ([name]) 08/15/2022   Bacteremia due to Streptococcus pneumoniae 08/12/2022   Metabolic acidosis 08/11/2022   Ulcer of left foot due to type 2 diabetes mellitus ([name]) 08/10/2022   Major depression in remission ([name]) 07/04/2022   Atherosclerosis of aorta ([name]) 07/04/2022   Insomnia due to other mental disorder 07/04/2022   Amputated toe of left foot ([name]) 07/04/2022   Chronic obstructive pulmonary disease ([name]) 01/28/2021   PAD (peripheral artery disease) ([name]) 12/03/2020   Diabetes mellitus ([name]) 05/24/2020   B12 deficiency 05/24/2020   Vitamin D deficiency 05/24/2020   Metatarsalgia of left foot 01/15/2020    Major depressive disorder, recurrent episode, moderate ([name]) 04/04/2019   Left arm weakness 04/03/2019   (HFpEF) heart failure with preserved ejection fraction ([name]) 08/31/2017   Moderate persistent asthma 08/31/2017   Hyperlipidemia associated with type 2 diabetes mellitus ([name]) 06/14/2017   Uncontrolled type 2 diabetes mellitus with hyperglycemia, with long-term current use of insulin ([name]) 08/18/2016   Charcot foot due to diabetes mellitus ([name]) 02/22/2016   Acquired abduction deformity of foot 07/12/2015   Osteoarthritis of subtalar joint 07/12/2015   Arthritis of foot, degenerative 07/12/2015   Carpal tunnel syndrome 04/17/2015   Chronic constipation 04/17/2015   Insomnia, persistent 04/17/2015   Stage 3b chronic kidney disease ([name]) 04/17/2015   Decreased exercise tolerance 04/17/2015   Diabetes mellitus with polyneuropathy ([name]) 04/17/2015   Gastroesophageal reflux disease without esophagitis 04/17/2015   Bursitis, trochanteric 04/17/2015   Cephalalgia 04/17/2015   Essential hypertension 04/17/2015   Adult hypothyroidism 04/17/2015   Hearing loss 04/17/2015   Chronic recurrent major depressive disorder ([name]) 04/17/2015   Neurogenic claudication 04/17/2015   Morbid obesity with BMI of 40.0-44.9, adult ([name]) 04/17/2015   Hypo-ovarianism 04/17/2015   Perennial allergic rhinitis with seasonal variation 04/17/2015   Acne erythematosa 04/17/2015   Dyskinesia, tardive 04/17/2015   Memory loss 04/17/2015   Impingement syndrome of shoulder 04/17/2015   Dermatitis, stasis 04/17/2015   Obstructive sleep apnea 05/14/2014   Shortness of breath on exertion 05/06/2014   Mixed hyperlipidemia 02/06/2012   LBP (low back pain) 09/16/2008    Past Surgical History:  Procedure Laterality Date   ABDOMINAL HYSTERECTOMY  1975   ANKLE SURGERY Left approx Jan 2018   APPENDECTOMY  1970   CATARACT EXTRACTION  01/2011   right   COLONOSCOPY WITH PROPOFOL N/A 08/19/2019   Procedure: COLONOSCOPY  WITH PROPOFOL;  Surgeon: Wyline Mood, MD;  Location: [facility] ENDOSCOPY;  Service: Gastroenterology;  Laterality: N/A;   eye lid surgery  2013   bilateral   FOOT SURGERY     LOWER EXTREMITY ANGIOGRAPHY Left 11/23/2020   Procedure: LOWER EXTREMITY ANGIOGRAPHY;  Surgeon: Renford Dills, MD;  Location: [facility] INVASIVE CV LAB;  Service: Cardiovascular;  Laterality: Left;   NECK SURGERY     SPINE SURGERY     TEE WITHOUT CARDIOVERSION N/A 08/16/2022   Procedure: TRANSESOPHAGEAL ECHOCARDIOGRAM (TEE);  Surgeon: Debbe Odea, MD;  Location: [facility] ORS;  Service: Cardiovascular;  Laterality: N/A;   TUBAL LIGATION     VAGINAL HYSTERECTOMY  1989    Family History  Problem Relation Age of Onset   Aneurysm Mother    Aortic aneurysm Mother    Hypertension Mother    Hyperlipidemia Mother    Heart disease Mother    Obesity Mother    Heart attack Maternal Grandfather    Diabetes Maternal Grandfather     Social History   Tobacco Use   Smoking status: Never  Smokeless tobacco: Never  Substance Use Topics   Alcohol use: No    Alcohol/week: 0.0 standard drinks of alcohol     Current Outpatient Medications:    amLODipine (NORVASC) 5 MG tablet, Take 1 tablet (5 mg total) by mouth daily., Disp: 90 tablet, Rfl: 0   ASPIRIN LOW DOSE 81 MG EC tablet, TAKE 1 TABLET (81 MG TOTAL) BY MOUTH DAILY., Disp: 30 tablet, Rfl: 0   Budeson-Glycopyrrol-Formoterol (BREZTRI AEROSPHERE) 160-9-4.8 MCG/ACT AERO, Inhale 2 puffs into the lungs in the morning and at bedtime., Disp: 5.9 g, Rfl: 0   buPROPion (WELLBUTRIN XL) 150 MG 24 hr tablet, Take 150 mg by mouth daily., Disp: , Rfl:    Cholecalciferol (VITAMIN D3) 25 MCG (1000 UT) CAPS, Take 1,000 Units by mouth., Disp: , Rfl:    Continuous Blood Gluc Sensor (FREESTYLE LIBRE 2 SENSOR) MISC, FOR GLUCOSE MONITORING, Disp: , Rfl:    cyanocobalamin (VITAMIN B12) 500 MCG tablet, Take 500 mcg by mouth daily., Disp: , Rfl:    dapagliflozin propanediol (FARXIGA) 10 MG TABS  tablet, Take 10 mg by mouth daily., Disp: , Rfl:    ezetimibe (ZETIA) 10 MG tablet, Take 1 tablet (10 mg total) by mouth daily., Disp: 90 tablet, Rfl: 3   feeding supplement (ENSURE ENLIVE / ENSURE PLUS) LIQD, Take 237 mLs by mouth 3 (three) times daily., Disp: 237 mL, Rfl: 12   furosemide (LASIX) 20 MG tablet, Take 1 tablet (20 mg total) by mouth daily., Disp: 90 tablet, Rfl: 1   irbesartan-hydrochlorothiazide (AVALIDE) 300-12.5 MG tablet, Take 1 tablet by mouth daily., Disp: , Rfl:    levothyroxine (SYNTHROID) 25 MCG tablet, Take 1 tablet (25 mcg total) by mouth daily before breakfast. Two on Sunday, Disp: 100 tablet, Rfl: 1   metFORMIN (GLUCOPHAGE) 500 MG tablet, Take 2 tablets by mouth daily with breakfast., Disp: 90 tablet, Rfl: 0   metoprolol tartrate (LOPRESSOR) 50 MG tablet, Take 0.5 tablets (25 mg total) by mouth 2 (two) times daily., Disp: , Rfl:    Misc Natural Products (OSTEO BI-FLEX ADV JOINT SHIELD PO), Take by mouth 2 (two) times daily., Disp: , Rfl:    montelukast (SINGULAIR) 10 MG tablet, Take 1 tablet (10 mg total) by mouth daily., Disp: 90 tablet, Rfl: 0   omeprazole (PRILOSEC) 40 MG capsule, Take 1 capsule (40 mg total) by mouth daily., Disp: 90 capsule, Rfl: 0   pregabalin (LYRICA) 150 MG capsule, Take 1 capsule (150 mg total) by mouth 2 (two) times daily for 10 days., Disp: 20 capsule, Rfl: 0   rosuvastatin (CRESTOR) 40 MG tablet, Take 1 tablet (40 mg total) by mouth daily., Disp: 90 tablet, Rfl: 1   sertraline (ZOLOFT) 100 MG tablet, Take 1.5 tablets (150 mg total) by mouth daily., Disp: 135 tablet, Rfl: 1   traZODone (DESYREL) 100 MG tablet, Take 1 tablet (100 mg total) by mouth at bedtime as needed for sleep., Disp: 90 tablet, Rfl: 1   TRESIBA FLEXTOUCH 200 UNIT/ML FlexTouch Pen, SMARTSIG:60 Unit(s) SUB-Q Daily, Disp: , Rfl:    VASCEPA 1 g capsule, Take 2 capsules (2 g total) by mouth 2 (two) times daily., Disp: 120 capsule, Rfl: 5   acetaminophen (TYLENOL 8 HOUR) 650 MG CR  tablet, Take 1 tablet (650 mg total) by mouth every 8 (eight) hours as needed for pain. (Patient not taking: Reported on 03/27/2023), Disp: 90 tablet, Rfl: 2   albuterol (VENTOLIN HFA) 108 (90 Base) MCG/ACT inhaler, Inhale 2 puffs into the lungs every 6 (  six) hours as needed for wheezing or shortness of breath. (Patient not taking: Reported on 03/27/2023), Disp: 8 g, Rfl: 2   B-D ULTRAFINE III SHORT PEN 31G X 8 MM MISC, SMARTSIG:1 Injection Daily (Patient not taking: Reported on 03/27/2023), Disp: , Rfl:    diclofenac Sodium (VOLTAREN) 1 % GEL, Apply 2 g topically 2 (two) times daily. (Patient not taking: Reported on 03/27/2023), Disp: , Rfl:    Multiple Vitamin (MULTIVITAMIN WITH MINERALS) TABS tablet, Take 1 tablet by mouth daily. (Patient not taking: Reported on 03/27/2023), Disp: , Rfl:    Potassium 99 MG TABS, Take by mouth daily at 6 (six) AM. (Patient not taking: Reported on 03/27/2023), Disp: , Rfl:    Probiotic Product (PROBIOTIC-10 PO), Take by mouth. (Patient not taking: Reported on 03/27/2023), Disp: , Rfl:    RELION INSULIN SYRINGE 1ML/31G 31G X 5/16" 1 ML MISC, , Disp: , Rfl:   Allergies  Allergen Reactions   Codeine Other (See Comments)    "TRIPPED OUT"  DIDN'T LIKE THE WAY IT FELT   Atorvastatin     muscle pain   Hydrocodone     itching   Tramadol    Latex Rash   Zolpidem Other (See Comments)    Sleep walk    I personally reviewed active problem list, medication list, allergies, family history, social history, health maintenance with the patient/caregiver today.   ROS  Ten systems reviewed and is negative except as mentioned in HPI    Objective  Vitals:   03/27/23 1140  BP: 118/70  Pulse: 70  Resp: 16  SpO2: 96%  Weight: 211 lb (95.7 kg)  Height: 5\' 2"  (1.575 m)    Body mass index is 38.59 kg/m.  Physical Exam  Constitutional: Patient appears well-developed and well-nourished. Obese  No distress.  HEENT: head atraumatic, normocephalic, pupils equal and  reactive to light, neck supple Cardiovascular: Normal rate, regular rhythm and normal heart sounds.  No murmur heard. No BLE edema. Pulmonary/Chest: Effort normal and breath sounds normal. No respiratory distress. Abdominal: Soft.  There is no tenderness. Muscular skeletal: difficulty getting up from chair, uses walker, antalgic gait and slow  Psychiatric: Patient has a normal mood and affect. behavior is normal. Judgment and thought content normal.    PHQ2/9:    01/29/2023    2:23 PM 01/15/2023   11:21 AM 07/04/2022    2:24 PM 06/30/2022    1:13 PM 04/04/2022    1:33 PM  Depression screen PHQ 2/9  Decreased Interest 3 3 0 0 2  Down, Depressed, Hopeless 0 0 0 0 1  PHQ - 2 Score 3 3 0 0 3  Altered sleeping 3 3 0 0 3  Tired, decreased energy 0 0 0 0 3  Change in appetite 0 0 0 0 2  Feeling bad or failure about yourself  0 0 0 0 2  Trouble concentrating 0 0 0 0 2  Moving slowly or fidgety/restless 0 0 0 0 0  Suicidal thoughts 0 0 0 0 0  PHQ-9 Score 6 6 0 0 15  Difficult doing work/chores Not difficult at all   Not difficult at all Very difficult    phq 9 is positive   Fall Risk:    03/27/2023   11:39 AM 01/29/2023    2:23 PM 01/15/2023   11:21 AM 07/04/2022    2:24 PM 06/30/2022    1:11 PM  Fall Risk   Falls in the past year? 0 0 0 1  1  Number falls in past yr: 0  0 1 1  Injury with Fall? 0  0 1 1  Risk for fall due to : Impaired balance/gait Impaired balance/gait Impaired balance/gait;Impaired mobility Impaired balance/gait;Impaired mobility;History of fall(s) Impaired balance/gait;Impaired mobility  Follow up Falls prevention discussed Falls prevention discussed Falls prevention discussed Falls prevention discussed;Education provided;Falls evaluation completed       Functional Status Survey: Is the patient deaf or have difficulty hearing?: No Does the patient have difficulty seeing, even when wearing glasses/contacts?: Yes Does the patient have difficulty concentrating,  remembering, or making decisions?: No Does the patient have difficulty walking or climbing stairs?: Yes Does the patient have difficulty dressing or bathing?: Yes Does the patient have difficulty doing errands alone such as visiting a doctor's office or shopping?: Yes    Assessment & Plan  1. Chronic heart failure with preserved ejection fraction ([name])  - metoprolol tartrate (LOPRESSOR) 50 MG tablet; Take 0.5 tablets (25 mg total) by mouth 2 (two) times daily.  Dispense: 90 tablet; Refill: 0  2. Small vessel disease ([name])  On Brain MRI, on statin therapy, BP is under control , DM also   3. PAD (peripheral artery disease) ([name])  On beta blocker, statin therapy and aspirin  4. Morbid obesity ([name])  Trying to lose weight, down a few pounds since last visit   5. Dyslipidemia associated with type 2 diabetes mellitus ([name])  On statin therapy and glucose is improving   6. Atherosclerosis of aorta ([name])  On statin therapy   7. Amputated toe of left foot ([name])  Stable, states ulcer resolved, sees Dr. Logan Bores   8. Chronic obstructive pulmonary disease, unspecified COPD type ([name])  Going to see Dr. Belia Heman soon  9. MDD (major depressive disorder), recurrent episode, moderate ([name])  Still not in remission, she is willing to try Vraylar  - cariprazine (VRAYLAR) 1.5 MG capsule; Take 1 capsule (1.5 mg total) by mouth daily.  Dispense: 30 capsule; Refill: 0  10. Bilateral chronic knee pain

## 2023-03-27 ENCOUNTER — Ambulatory Visit (INDEPENDENT_AMBULATORY_CARE_PROVIDER_SITE_OTHER): Payer: No Typology Code available for payment source | Admitting: Family Medicine

## 2023-03-27 ENCOUNTER — Encounter: Payer: Self-pay | Admitting: Family Medicine

## 2023-03-27 VITALS — BP 118/70 | HR 70 | Resp 16 | Ht 62.0 in | Wt 211.0 lb

## 2023-03-27 DIAGNOSIS — M25561 Pain in right knee: Secondary | ICD-10-CM | POA: Diagnosis not present

## 2023-03-27 DIAGNOSIS — E785 Hyperlipidemia, unspecified: Secondary | ICD-10-CM | POA: Diagnosis not present

## 2023-03-27 DIAGNOSIS — I700 Atherosclerosis of aorta: Secondary | ICD-10-CM | POA: Diagnosis not present

## 2023-03-27 DIAGNOSIS — F331 Major depressive disorder, recurrent, moderate: Secondary | ICD-10-CM | POA: Diagnosis not present

## 2023-03-27 DIAGNOSIS — M25562 Pain in left knee: Secondary | ICD-10-CM | POA: Diagnosis not present

## 2023-03-27 DIAGNOSIS — S98132A Complete traumatic amputation of one left lesser toe, initial encounter: Secondary | ICD-10-CM | POA: Diagnosis not present

## 2023-03-27 DIAGNOSIS — I739 Peripheral vascular disease, unspecified: Secondary | ICD-10-CM | POA: Diagnosis not present

## 2023-03-27 DIAGNOSIS — I5032 Chronic diastolic (congestive) heart failure: Secondary | ICD-10-CM | POA: Diagnosis not present

## 2023-03-27 DIAGNOSIS — G8929 Other chronic pain: Secondary | ICD-10-CM

## 2023-03-27 DIAGNOSIS — E1169 Type 2 diabetes mellitus with other specified complication: Secondary | ICD-10-CM | POA: Diagnosis not present

## 2023-03-27 DIAGNOSIS — J449 Chronic obstructive pulmonary disease, unspecified: Secondary | ICD-10-CM | POA: Diagnosis not present

## 2023-03-27 MED ORDER — CARIPRAZINE HCL 1.5 MG PO CAPS
1.5000 mg | ORAL_CAPSULE | Freq: Every day | ORAL | 0 refills | Status: DC
Start: 2023-03-27 — End: 2023-03-30

## 2023-03-27 MED ORDER — METOPROLOL TARTRATE 50 MG PO TABS
25.0000 mg | ORAL_TABLET | Freq: Two times a day (BID) | ORAL | 0 refills | Status: DC
Start: 2023-03-27 — End: 2023-06-18

## 2023-03-27 NOTE — Patient Instructions (Signed)
Referral has been sent to [facility].  P: [id number] F: [phone]

## 2023-03-28 NOTE — Telephone Encounter (Signed)
Copied from CRM [id number]. Topic: General - Inquiry >> Mar 28, 2023  1:53 PM Marlow Baars wrote: Reason for CRM: Barbara Cower with [facility] called in wanting the provider to be alert he is sending over a 3 page fax that he needs signed and dated and faxed back. Please assist further

## 2023-03-30 ENCOUNTER — Telehealth: Payer: Self-pay | Admitting: Family Medicine

## 2023-03-30 ENCOUNTER — Other Ambulatory Visit: Payer: Self-pay | Admitting: Family Medicine

## 2023-03-30 MED ORDER — ARIPIPRAZOLE 2 MG PO TABS: 2.0000 mg | ORAL_TABLET | Freq: Every evening | ORAL | 0 refills | Status: DC

## 2023-03-30 NOTE — Telephone Encounter (Signed)
Pt states that the medication her PCP called in for her is too expensive. Per pt it is $300.00   cariprazine (VRAYLAR) 1.5 MG capsule   Pt is wanting to know if something else could be called in for her.     [facility] [address], [place] - [address] [place], [place] [address] Phone: [phone]  Fax: [phone]

## 2023-04-16 ENCOUNTER — Other Ambulatory Visit: Payer: Self-pay

## 2023-04-16 DIAGNOSIS — N1832 Chronic kidney disease, stage 3b: Secondary | ICD-10-CM

## 2023-04-16 MED ORDER — FUROSEMIDE 20 MG PO TABS
20.0000 mg | ORAL_TABLET | Freq: Every day | ORAL | 1 refills | Status: DC
Start: 2023-04-16 — End: 2023-06-18

## 2023-04-16 MED ORDER — EZETIMIBE 10 MG PO TABS: 10.0000 mg | ORAL_TABLET | Freq: Every day | ORAL | 3 refills | Status: DC

## 2023-04-19 ENCOUNTER — Other Ambulatory Visit: Payer: Self-pay | Admitting: Family Medicine

## 2023-04-25 ENCOUNTER — Encounter: Payer: Self-pay | Admitting: Internal Medicine

## 2023-04-25 ENCOUNTER — Ambulatory Visit: Payer: No Typology Code available for payment source | Admitting: Internal Medicine

## 2023-04-25 VITALS — BP 124/78 | HR 64 | Temp 97.6°F | Ht 62.0 in | Wt 210.8 lb

## 2023-04-25 DIAGNOSIS — J449 Chronic obstructive pulmonary disease, unspecified: Secondary | ICD-10-CM

## 2023-04-25 DIAGNOSIS — G4733 Obstructive sleep apnea (adult) (pediatric): Secondary | ICD-10-CM

## 2023-04-25 NOTE — Progress Notes (Signed)
[facility] [facility] Consultation        Date: 04/25/2023  MRN# [id number] Summer [name] 01-Jan-1951   Summer [name] is a 72 y.o. old female seen in consultation for chief complaint of:      **Spirometry 10/31/2018>> tracings personally reviewed.  FVC is 64% predicted, FEV1 is 60% predicted.  Ratio is 70%.  Expiratory time is 6.1 seconds.  Overall this test is consistent with moderate obstructive lung disease. **Echo 07/04/17; EF=70%>> RV systolic function reported as "normal" **08/18/16 CBC>> Eos=438 **05/12/14; Echo>> EF= 55%; Pulm systolic pressure was normal.   CC Follow-up COPD Follow-up OSA   HPI 72 year old morbidly obese white female with persistent dyspnea on exertion Patient does have a history of COPD Moderate COPD based on previous pulm function testing Patient states her breathing is stable We will plan to wean off Breztri Avoid secondhand smoke and irritants   Patient of does have a history of sleep apnea but is intolerant of CPAP Patient has gained significant amount of weight over the last several years  Patient is currently not on any inhaler therapy   No exacerbation at this time No evidence of heart failure at this time No evidence or signs of infection at this time No respiratory distress No fevers, chills, nausea, vomiting, diarrhea No evidence of lower extremity edema No evidence hemoptysis  Patient with a previous history of spinal meningitis and COVID Thus plan to check oxygen levels at nighttime with overnight pulse oximetry  CXR 07/16/17>>Bibasilar atelectasis.   Medication:    Current Outpatient Medications:    acetaminophen (TYLENOL 8 HOUR) 650 MG CR tablet, Take 1 tablet (650 mg total) by mouth every 8 (eight) hours as needed for pain. (Patient not taking: Reported on 03/27/2023), Disp: 90 tablet, Rfl: 2   amLODipine (NORVASC) 5 MG tablet, Take 1 tablet (5 mg total) by mouth daily., Disp: 90 tablet, Rfl: 0   ARIPiprazole (ABILIFY)  2 MG tablet, Take 1 tablet by mouth every evening., Disp: 30 tablet, Rfl: 0   ASPIRIN LOW DOSE 81 MG EC tablet, TAKE 1 TABLET (81 MG TOTAL) BY MOUTH DAILY., Disp: 30 tablet, Rfl: 0   B-D ULTRAFINE III SHORT PEN 31G X 8 MM MISC, SMARTSIG:1 Injection Daily (Patient not taking: Reported on 03/27/2023), Disp: , Rfl:    Budeson-Glycopyrrol-Formoterol (BREZTRI AEROSPHERE) 160-9-4.8 MCG/ACT AERO, Inhale 2 puffs into the lungs in the morning and at bedtime., Disp: 5.9 g, Rfl: 0   buPROPion (WELLBUTRIN XL) 150 MG 24 hr tablet, Take 150 mg by mouth daily., Disp: , Rfl:    Cholecalciferol (VITAMIN D3) 25 MCG (1000 UT) CAPS, Take 1,000 Units by mouth., Disp: , Rfl:    Continuous Blood Gluc Sensor (FREESTYLE LIBRE 2 SENSOR) MISC, FOR GLUCOSE MONITORING, Disp: , Rfl:    cyanocobalamin (VITAMIN B12) 500 MCG tablet, Take 500 mcg by mouth daily., Disp: , Rfl:    dapagliflozin propanediol (FARXIGA) 10 MG TABS tablet, Take 10 mg by mouth daily., Disp: , Rfl:    diclofenac Sodium (VOLTAREN) 1 % GEL, Apply 2 g topically 2 (two) times daily. (Patient not taking: Reported on 03/27/2023), Disp: , Rfl:    ezetimibe (ZETIA) 10 MG tablet, Take 1 tablet (10 mg total) by mouth daily., Disp: 90 tablet, Rfl: 3   feeding supplement (ENSURE ENLIVE / ENSURE PLUS) LIQD, Take 237 mLs by mouth 3 (three) times daily., Disp: 237 mL, Rfl: 12   furosemide (LASIX) 20 MG tablet, Take 1 tablet (20 mg total) by mouth  daily., Disp: 90 tablet, Rfl: 1   irbesartan-hydrochlorothiazide (AVALIDE) 300-12.5 MG tablet, Take 1 tablet by mouth daily., Disp: , Rfl:    levothyroxine (SYNTHROID) 25 MCG tablet, Take 1 tablet (25 mcg total) by mouth daily before breakfast. Two on Sunday, Disp: 100 tablet, Rfl: 1   metFORMIN (GLUCOPHAGE) 500 MG tablet, Take 2 tablets by mouth daily with breakfast., Disp: 90 tablet, Rfl: 0   metoprolol tartrate (LOPRESSOR) 50 MG tablet, Take 0.5 tablets (25 mg total) by mouth 2 (two) times daily., Disp: 90 tablet, Rfl: 0   Misc  Natural Products (OSTEO BI-FLEX ADV JOINT SHIELD PO), Take by mouth 2 (two) times daily., Disp: , Rfl:    montelukast (SINGULAIR) 10 MG tablet, Take 1 tablet (10 mg total) by mouth daily., Disp: 90 tablet, Rfl: 0   omeprazole (PRILOSEC) 40 MG capsule, Take 1 capsule (40 mg total) by mouth daily., Disp: 90 capsule, Rfl: 0   pregabalin (LYRICA) 150 MG capsule, Take 1 capsule (150 mg total) by mouth 2 (two) times daily for 10 days., Disp: 20 capsule, Rfl: 0   rosuvastatin (CRESTOR) 40 MG tablet, Take 1 tablet (40 mg total) by mouth daily., Disp: 90 tablet, Rfl: 1   sertraline (ZOLOFT) 100 MG tablet, Take 1.5 tablets (150 mg total) by mouth daily., Disp: 135 tablet, Rfl: 1   traZODone (DESYREL) 100 MG tablet, Take 1 tablet (100 mg total) by mouth at bedtime as needed for sleep., Disp: 90 tablet, Rfl: 1   TRESIBA FLEXTOUCH 200 UNIT/ML FlexTouch Pen, SMARTSIG:60 Unit(s) SUB-Q Daily, Disp: , Rfl:    VASCEPA 1 g capsule, Take 2 capsules (2 g total) by mouth 2 (two) times daily., Disp: 120 capsule, Rfl: 5   Allergies:  Codeine, Atorvastatin, Hydrocodone, Tramadol, Latex, and Zolpidem   BP 124/78 (BP Location: Left Arm, Cuff Size: Large)   Pulse 64   Temp 97.6 F (36.4 C) (Temporal)   Ht 5\' 2"  (1.575 m)   Wt 210 lb 12.8 oz (95.6 kg)   SpO2 94%   BMI 38.56 kg/m    Review of Systems: Gen:  Denies  fever, sweats, chills weight loss  HEENT: Denies blurred vision, double vision, ear pain, eye pain, hearing loss, nose bleeds, sore throat Cardiac:  No dizziness, chest pain or heaviness, chest tightness,edema, No JVD Resp:   No cough, +sputum production, +shortness of breath,-wheezing, -hemoptysis,  Other:  All other systems negative   Physical Examination:   General Appearance: No distress  EYES PERRLA, EOM intact.   NECK Supple, No JVD Pulmonary: normal breath sounds, No wheezing.  CardiovascularNormal S1,S2.  No m/r/g.   Abdomen: Benign, Soft, non-tender. Neurology UE/LE 5/5 strength, no  focal deficits Ext pulses intact, cap refill intact ALL OTHER ROS ARE NEGATIVE    Assessment and Plan:  72 year old pleasant white female seen today for follow-up moderate COPD and noncompliant OSA in the setting of previous diagnosis of COVID infection with spinal meningitis in the setting of morbid obesity and deconditioned state  COPD moderate Stable at this time Wean off inhaler therapy at this time Plan to use flutter valve Will obtain overnight pulse oximetry to assess for nocturnal hypoxia  Obesity -recommend significant weight loss -recommend changing diet  Deconditioned state -Recommend increased daily activity and exercise   GERD Continue PPI as prescribed  OSA Patient is intolerant of CPAP High risk for complications    MEDICATION ADJUSTMENTS/LABS AND TESTS ORDERED: Lets plan to stop Breztri at this time and assess breathing status Lets plan  to use flutter valve 10 times per day to assist with coughing  Recommend significant weight loss  Avoid secondhand smoke Avoid SICK contacts Recommend  Masking  when appropriate Recommend Keep up-to-date with vaccinations   Check oxygen levels at night   CURRENT MEDICATIONS REVIEWED AT LENGTH WITH PATIENT TODAY   Patient satisfied with Plan of action and management. All questions answered  Follow-up in 6 months  Total Time Spent 32 mins    Santiago Glad, M.D.  [facility]  Medical Director [facility] Medical Director [facility] Cardio-Pulmonary Department

## 2023-04-25 NOTE — Addendum Note (Signed)
Addended by: Bonney Leitz on: 04/25/2023 03:36 PM   Modules accepted: Orders

## 2023-04-25 NOTE — Addendum Note (Signed)
Addended by: Lajoyce Lauber A on: 04/25/2023 03:08 PM   Modules accepted: Orders

## 2023-04-25 NOTE — Patient Instructions (Addendum)
Lets plan to stop Breztri at this time and assess breathing status Lets plan to use flutter valve 10 times per day to assist with coughing  Recommend significant weight loss  Avoid secondhand smoke Avoid SICK contacts Recommend  Masking  when appropriate Recommend Keep up-to-date with vaccinations   Check oxygen levels at night

## 2023-04-30 NOTE — Progress Notes (Unsigned)
Name: Summer [name]   MRN: [id number]    DOB: 11/28/1950   Date:05/01/2023       Progress Note  Subjective  Chief Complaint  Follow Up  HPI  She had an appointment for  medication reconciliation but is not feeling well and did not bring her medications  Foot ulcer on two toes of left foot going on for the past week, she has a visit with [facility] this afternoon, her 2nd toe of left foot is swollen /tender and red. Explained since she is having fatigue, nausea,  lack of appetite, and ulceration of toes , she may be septic and advised to be sent straight to [facility] but she refused , discussed with Dr. Logan Bores and he will see her this afternoon and arrange for direct admission  Uncontrolled HTN: improved a little with rest, she states taking medications but not certain of the names. She may be direct admitted today and it will be managed at hospital   Patient Active Problem List   Diagnosis Date Noted   Small vessel disease ([name]) 03/27/2023   Coronary artery disease involving native coronary artery of native heart without angina pectoris 03/16/2023   First degree AV block 03/16/2023   CKD (chronic kidney disease) stage 4, GFR 15-29 ml/min ([name]) 03/16/2023   Posterior reversible encephalopathy syndrome 08/21/2022   Seizure ([name]) 08/20/2022   Multiple falls 08/15/2022   Gouty arthritis 08/15/2022   Diabetic ulcer of left midfoot associated with diabetes mellitus due to underlying condition, limited to breakdown of skin ([name]) 08/15/2022   Bacteremia due to Streptococcus pneumoniae 08/12/2022   Metabolic acidosis 08/11/2022   Ulcer of left foot due to type 2 diabetes mellitus ([name]) 08/10/2022   Major depression in remission ([name]) 07/04/2022   Atherosclerosis of aorta ([name]) 07/04/2022   Insomnia due to other mental disorder 07/04/2022   Amputated toe of left foot ([name]) 07/04/2022   Chronic obstructive pulmonary disease ([name]) 01/28/2021   PAD (peripheral artery disease) ([name]) 12/03/2020    Diabetes mellitus ([name]) 05/24/2020   B12 deficiency 05/24/2020   Vitamin D deficiency 05/24/2020   Metatarsalgia of left foot 01/15/2020   MDD (major depressive disorder), recurrent episode, moderate ([name]) 04/04/2019   Left arm weakness 04/03/2019   (HFpEF) heart failure with preserved ejection fraction ([name]) 08/31/2017   Moderate persistent asthma 08/31/2017   Hyperlipidemia associated with type 2 diabetes mellitus ([name]) 06/14/2017   Uncontrolled type 2 diabetes mellitus with hyperglycemia, with long-term current use of insulin ([name]) 08/18/2016   Charcot foot due to diabetes mellitus ([name]) 02/22/2016   Acquired abduction deformity of foot 07/12/2015   Osteoarthritis of subtalar joint 07/12/2015   Arthritis of foot, degenerative 07/12/2015   Carpal tunnel syndrome 04/17/2015   Chronic constipation 04/17/2015   Insomnia, persistent 04/17/2015   Stage 3b chronic kidney disease ([name]) 04/17/2015   Decreased exercise tolerance 04/17/2015   Diabetes mellitus with polyneuropathy ([name]) 04/17/2015   Gastroesophageal reflux disease without esophagitis 04/17/2015   Bursitis, trochanteric 04/17/2015   Cephalalgia 04/17/2015   Essential hypertension 04/17/2015   Adult hypothyroidism 04/17/2015   Hearing loss 04/17/2015   Chronic recurrent major depressive disorder ([name]) 04/17/2015   Neurogenic claudication 04/17/2015   Morbid obesity with BMI of 40.0-44.9, adult ([name]) 04/17/2015   Hypo-ovarianism 04/17/2015   Perennial allergic rhinitis with seasonal variation 04/17/2015   Acne erythematosa 04/17/2015   Dyskinesia, tardive 04/17/2015   Memory loss 04/17/2015   Impingement syndrome of shoulder 04/17/2015   Dermatitis, stasis 04/17/2015  Obstructive sleep apnea 05/14/2014   Shortness of breath on exertion 05/06/2014   Mixed hyperlipidemia 02/06/2012   LBP (low back pain) 09/16/2008    Past Surgical History:  Procedure Laterality Date   ABDOMINAL HYSTERECTOMY  1975   ANKLE SURGERY Left  approx Jan 2018   APPENDECTOMY  1970   CATARACT EXTRACTION  01/2011   right   COLONOSCOPY WITH PROPOFOL N/A 08/19/2019   Procedure: COLONOSCOPY WITH PROPOFOL;  Surgeon: Wyline Mood, MD;  Location: [facility] ENDOSCOPY;  Service: Gastroenterology;  Laterality: N/A;   eye lid surgery  2013   bilateral   FOOT SURGERY     LOWER EXTREMITY ANGIOGRAPHY Left 11/23/2020   Procedure: LOWER EXTREMITY ANGIOGRAPHY;  Surgeon: Renford Dills, MD;  Location: [facility] INVASIVE CV LAB;  Service: Cardiovascular;  Laterality: Left;   NECK SURGERY     SPINE SURGERY     TEE WITHOUT CARDIOVERSION N/A 08/16/2022   Procedure: TRANSESOPHAGEAL ECHOCARDIOGRAM (TEE);  Surgeon: Debbe Odea, MD;  Location: [facility] ORS;  Service: Cardiovascular;  Laterality: N/A;   TUBAL LIGATION     VAGINAL HYSTERECTOMY  1989    Family History  Problem Relation Age of Onset   Aneurysm Mother    Aortic aneurysm Mother    Hypertension Mother    Hyperlipidemia Mother    Heart disease Mother    Obesity Mother    Heart attack Maternal Grandfather    Diabetes Maternal Grandfather     Social History   Tobacco Use   Smoking status: Never   Smokeless tobacco: Never  Substance Use Topics   Alcohol use: No    Alcohol/week: 0.0 standard drinks of alcohol     Current Outpatient Medications:    acetaminophen (TYLENOL 8 HOUR) 650 MG CR tablet, Take 1 tablet (650 mg total) by mouth every 8 (eight) hours as needed for pain., Disp: 90 tablet, Rfl: 2   amLODipine (NORVASC) 5 MG tablet, Take 1 tablet (5 mg total) by mouth daily., Disp: 90 tablet, Rfl: 0   ARIPiprazole (ABILIFY) 2 MG tablet, Take 1 tablet by mouth every evening., Disp: 30 tablet, Rfl: 0   ASPIRIN LOW DOSE 81 MG EC tablet, TAKE 1 TABLET (81 MG TOTAL) BY MOUTH DAILY., Disp: 30 tablet, Rfl: 0   B-D ULTRAFINE III SHORT PEN 31G X 8 MM MISC, , Disp: , Rfl:    Budeson-Glycopyrrol-Formoterol (BREZTRI AEROSPHERE) 160-9-4.8 MCG/ACT AERO, Inhale 2 puffs into the lungs in the morning and  at bedtime., Disp: 5.9 g, Rfl: 0   buPROPion (WELLBUTRIN XL) 150 MG 24 hr tablet, Take 150 mg by mouth daily., Disp: , Rfl:    Cholecalciferol (VITAMIN D3) 25 MCG (1000 UT) CAPS, Take 1,000 Units by mouth., Disp: , Rfl:    Continuous Blood Gluc Sensor (FREESTYLE LIBRE 2 SENSOR) MISC, FOR GLUCOSE MONITORING, Disp: , Rfl:    cyanocobalamin (VITAMIN B12) 500 MCG tablet, Take 500 mcg by mouth daily., Disp: , Rfl:    dapagliflozin propanediol (FARXIGA) 10 MG TABS tablet, Take 10 mg by mouth daily., Disp: , Rfl:    diclofenac Sodium (VOLTAREN) 1 % GEL, Apply 2 g topically 2 (two) times daily., Disp: , Rfl:    ezetimibe (ZETIA) 10 MG tablet, Take 1 tablet (10 mg total) by mouth daily., Disp: 90 tablet, Rfl: 3   feeding supplement (ENSURE ENLIVE / ENSURE PLUS) LIQD, Take 237 mLs by mouth 3 (three) times daily., Disp: 237 mL, Rfl: 12   furosemide (LASIX) 20 MG tablet, Take 1 tablet (20 mg total)  by mouth daily., Disp: 90 tablet, Rfl: 1   irbesartan-hydrochlorothiazide (AVALIDE) 300-12.5 MG tablet, Take 1 tablet by mouth daily., Disp: , Rfl:    levothyroxine (SYNTHROID) 25 MCG tablet, Take 1 tablet (25 mcg total) by mouth daily before breakfast. Two on Sunday, Disp: 100 tablet, Rfl: 1   metFORMIN (GLUCOPHAGE) 500 MG tablet, Take 2 tablets by mouth daily with breakfast., Disp: 90 tablet, Rfl: 0   metoprolol tartrate (LOPRESSOR) 50 MG tablet, Take 0.5 tablets (25 mg total) by mouth 2 (two) times daily., Disp: 90 tablet, Rfl: 0   Misc Natural Products (OSTEO BI-FLEX ADV JOINT SHIELD PO), Take by mouth 2 (two) times daily., Disp: , Rfl:    montelukast (SINGULAIR) 10 MG tablet, Take 1 tablet (10 mg total) by mouth daily., Disp: 90 tablet, Rfl: 0   omeprazole (PRILOSEC) 40 MG capsule, Take 1 capsule (40 mg total) by mouth daily., Disp: 90 capsule, Rfl: 0   pregabalin (LYRICA) 150 MG capsule, Take 1 capsule (150 mg total) by mouth 2 (two) times daily for 10 days., Disp: 20 capsule, Rfl: 0   rosuvastatin (CRESTOR)  40 MG tablet, Take 1 tablet (40 mg total) by mouth daily., Disp: 90 tablet, Rfl: 1   sertraline (ZOLOFT) 100 MG tablet, Take 1.5 tablets (150 mg total) by mouth daily., Disp: 135 tablet, Rfl: 1   traZODone (DESYREL) 100 MG tablet, Take 1 tablet (100 mg total) by mouth at bedtime as needed for sleep., Disp: 90 tablet, Rfl: 1   TRESIBA FLEXTOUCH 200 UNIT/ML FlexTouch Pen, SMARTSIG:60 Unit(s) SUB-Q Daily, Disp: , Rfl:    VASCEPA 1 g capsule, Take 2 capsules (2 g total) by mouth 2 (two) times daily., Disp: 120 capsule, Rfl: 5  Allergies  Allergen Reactions   Codeine Other (See Comments)    "TRIPPED OUT"  DIDN'T LIKE THE WAY IT FELT   Atorvastatin     muscle pain   Hydrocodone     itching   Tramadol    Latex Rash   Zolpidem Other (See Comments)    Sleep walk    I personally reviewed active problem list, medication list, allergies, family history, social history, health maintenance with the patient/caregiver today.   ROS  Ten systems reviewed and is negative except as mentioned in HPI    Objective  Vitals:   05/01/23 1128 05/01/23 1209  BP: (!) 170/84 (!) 164/76  Pulse: 90   Resp: 20   Temp: 98.1 F (36.7 C)   TempSrc: Oral   SpO2: 95%   Weight: 206 lb 3.2 oz (93.5 kg)   Height: 5\' 2"  (1.575 m)     Body mass index is 37.71 kg/m.  Physical Exam  Constitutional: Patient appears pale , no acute distress  HEENT: head atraumatic, normocephalic, pupils equal and reactive to light, neck supple Cardiovascular: Normal rate, regular rhythm and normal heart sounds.  No murmur heard. No BLE edema. Pulmonary/Chest: Effort normal and breath sounds normal. No respiratory distress. Psychiatric: Patient has a normal mood and affect. behavior is normal. Judgment and thought content normal.  Foot exam: see attached photos, ulceration present, erythema 2nd toe left   PHQ2/9:    05/01/2023   11:31 AM 01/29/2023    2:23 PM 01/15/2023   11:21 AM 07/04/2022    2:24 PM 06/30/2022    1:13 PM   Depression screen PHQ 2/9  Decreased Interest 0 3 3 0 0  Down, Depressed, Hopeless 0 0 0 0 0  PHQ - 2 Score 0 3  3 0 0  Altered sleeping 0 3 3 0 0  Tired, decreased energy 3 0 0 0 0  Change in appetite 3 0 0 0 0  Feeling bad or failure about yourself  0 0 0 0 0  Trouble concentrating 0 0 0 0 0  Moving slowly or fidgety/restless 0 0 0 0 0  Suicidal thoughts 0 0 0 0 0  PHQ-9 Score 6 6 6  0 0  Difficult doing work/chores Somewhat difficult Not difficult at all   Not difficult at all    phq 9 is negative   Fall Risk:    05/01/2023   11:30 AM 03/27/2023   11:39 AM 01/29/2023    2:23 PM 01/15/2023   11:21 AM 07/04/2022    2:24 PM  Fall Risk   Falls in the past year? 0 0 0 0 1  Number falls in past yr:  0  0 1  Injury with Fall?  0  0 1  Risk for fall due to : Impaired balance/gait Impaired balance/gait Impaired balance/gait Impaired balance/gait;Impaired mobility Impaired balance/gait;Impaired mobility;History of fall(s)  Follow up Falls prevention discussed;Education provided;Falls evaluation completed Falls prevention discussed Falls prevention discussed Falls prevention discussed Falls prevention discussed;Education provided;Falls evaluation completed      Functional Status Survey: Is the patient deaf or have difficulty hearing?: No Does the patient have difficulty seeing, even when wearing glasses/contacts?: Yes Does the patient have difficulty concentrating, remembering, or making decisions?: No Does the patient have difficulty walking or climbing stairs?: Yes Does the patient have difficulty dressing or bathing?: Yes Does the patient have difficulty doing errands alone such as visiting a doctor's office or shopping?: Yes    Assessment & Plan  1. Diabetic ulcer of toe of left foot associated with type 2 diabetes mellitus, with other ulcer severity ([name])  Keep visit with Dr. Logan Bores  2. Uncontrolled hypertension  Hopefully her partner can take medications to [facility] when  admitted today

## 2023-05-01 ENCOUNTER — Inpatient Hospital Stay: Payer: No Typology Code available for payment source

## 2023-05-01 ENCOUNTER — Encounter: Payer: Self-pay | Admitting: Family Medicine

## 2023-05-01 ENCOUNTER — Encounter: Payer: Self-pay | Admitting: Podiatry

## 2023-05-01 ENCOUNTER — Other Ambulatory Visit: Payer: Self-pay

## 2023-05-01 ENCOUNTER — Inpatient Hospital Stay
Admission: EM | Admit: 2023-05-01 | Discharge: 2023-05-04 | DRG: 240 | Disposition: A | Discharge status: Home Health | Payer: No Typology Code available for payment source | Attending: Internal Medicine | Admitting: Internal Medicine

## 2023-05-01 ENCOUNTER — Ambulatory Visit (INDEPENDENT_AMBULATORY_CARE_PROVIDER_SITE_OTHER): Payer: No Typology Code available for payment source | Admitting: Podiatry

## 2023-05-01 ENCOUNTER — Ambulatory Visit (INDEPENDENT_AMBULATORY_CARE_PROVIDER_SITE_OTHER): Payer: No Typology Code available for payment source | Admitting: Family Medicine

## 2023-05-01 VITALS — BP 164/76 | HR 90 | Temp 98.1°F | Resp 20 | Ht 62.0 in | Wt 206.2 lb

## 2023-05-01 DIAGNOSIS — I10 Essential (primary) hypertension: Secondary | ICD-10-CM

## 2023-05-01 DIAGNOSIS — Z7982 Long term (current) use of aspirin: Secondary | ICD-10-CM

## 2023-05-01 DIAGNOSIS — Z66 Do not resuscitate: Secondary | ICD-10-CM | POA: Diagnosis present

## 2023-05-01 DIAGNOSIS — L97529 Non-pressure chronic ulcer of other part of left foot with unspecified severity: Secondary | ICD-10-CM | POA: Diagnosis not present

## 2023-05-01 DIAGNOSIS — Z7984 Long term (current) use of oral hypoglycemic drugs: Secondary | ICD-10-CM

## 2023-05-01 DIAGNOSIS — E1152 Type 2 diabetes mellitus with diabetic peripheral angiopathy with gangrene: Principal | ICD-10-CM | POA: Diagnosis present

## 2023-05-01 DIAGNOSIS — Z8349 Family history of other endocrine, nutritional and metabolic diseases: Secondary | ICD-10-CM

## 2023-05-01 DIAGNOSIS — B964 Proteus (mirabilis) (morganii) as the cause of diseases classified elsewhere: Secondary | ICD-10-CM | POA: Diagnosis not present

## 2023-05-01 DIAGNOSIS — I129 Hypertensive chronic kidney disease with stage 1 through stage 4 chronic kidney disease, or unspecified chronic kidney disease: Secondary | ICD-10-CM | POA: Diagnosis not present

## 2023-05-01 DIAGNOSIS — E11621 Type 2 diabetes mellitus with foot ulcer: Secondary | ICD-10-CM

## 2023-05-01 DIAGNOSIS — M7989 Other specified soft tissue disorders: Secondary | ICD-10-CM | POA: Diagnosis not present

## 2023-05-01 DIAGNOSIS — I440 Atrioventricular block, first degree: Secondary | ICD-10-CM | POA: Diagnosis not present

## 2023-05-01 DIAGNOSIS — M19072 Primary osteoarthritis, left ankle and foot: Secondary | ICD-10-CM | POA: Diagnosis not present

## 2023-05-01 DIAGNOSIS — Z90710 Acquired absence of both cervix and uterus: Secondary | ICD-10-CM

## 2023-05-01 DIAGNOSIS — Z9049 Acquired absence of other specified parts of digestive tract: Secondary | ICD-10-CM

## 2023-05-01 DIAGNOSIS — Z1629 Resistance to other single specified antibiotic: Secondary | ICD-10-CM | POA: Diagnosis not present

## 2023-05-01 DIAGNOSIS — M1990 Unspecified osteoarthritis, unspecified site: Secondary | ICD-10-CM | POA: Diagnosis present

## 2023-05-01 DIAGNOSIS — J45909 Unspecified asthma, uncomplicated: Secondary | ICD-10-CM | POA: Diagnosis not present

## 2023-05-01 DIAGNOSIS — L03115 Cellulitis of right lower limb: Secondary | ICD-10-CM | POA: Diagnosis not present

## 2023-05-01 DIAGNOSIS — L03032 Cellulitis of left toe: Secondary | ICD-10-CM

## 2023-05-01 DIAGNOSIS — M869 Osteomyelitis, unspecified: Secondary | ICD-10-CM | POA: Diagnosis present

## 2023-05-01 DIAGNOSIS — Z888 Allergy status to other drugs, medicaments and biological substances status: Secondary | ICD-10-CM

## 2023-05-01 DIAGNOSIS — E1140 Type 2 diabetes mellitus with diabetic neuropathy, unspecified: Secondary | ICD-10-CM | POA: Diagnosis present

## 2023-05-01 DIAGNOSIS — Z1611 Resistance to penicillins: Secondary | ICD-10-CM | POA: Diagnosis present

## 2023-05-01 DIAGNOSIS — E039 Hypothyroidism, unspecified: Secondary | ICD-10-CM | POA: Diagnosis present

## 2023-05-01 DIAGNOSIS — L97522 Non-pressure chronic ulcer of other part of left foot with fat layer exposed: Secondary | ICD-10-CM

## 2023-05-01 DIAGNOSIS — L97519 Non-pressure chronic ulcer of other part of right foot with unspecified severity: Secondary | ICD-10-CM | POA: Diagnosis not present

## 2023-05-01 DIAGNOSIS — E1169 Type 2 diabetes mellitus with other specified complication: Secondary | ICD-10-CM | POA: Diagnosis present

## 2023-05-01 DIAGNOSIS — H9190 Unspecified hearing loss, unspecified ear: Secondary | ICD-10-CM | POA: Diagnosis not present

## 2023-05-01 DIAGNOSIS — E11628 Type 2 diabetes mellitus with other skin complications: Secondary | ICD-10-CM | POA: Diagnosis not present

## 2023-05-01 DIAGNOSIS — Z7951 Long term (current) use of inhaled steroids: Secondary | ICD-10-CM

## 2023-05-01 DIAGNOSIS — M109 Gout, unspecified: Secondary | ICD-10-CM | POA: Diagnosis present

## 2023-05-01 DIAGNOSIS — M1712 Unilateral primary osteoarthritis, left knee: Secondary | ICD-10-CM | POA: Diagnosis not present

## 2023-05-01 DIAGNOSIS — E11610 Type 2 diabetes mellitus with diabetic neuropathic arthropathy: Secondary | ICD-10-CM | POA: Diagnosis present

## 2023-05-01 DIAGNOSIS — L089 Local infection of the skin and subcutaneous tissue, unspecified: Secondary | ICD-10-CM

## 2023-05-01 DIAGNOSIS — N1831 Chronic kidney disease, stage 3a: Secondary | ICD-10-CM | POA: Diagnosis not present

## 2023-05-01 DIAGNOSIS — L97528 Non-pressure chronic ulcer of other part of left foot with other specified severity: Secondary | ICD-10-CM

## 2023-05-01 DIAGNOSIS — M86672 Other chronic osteomyelitis, left ankle and foot: Secondary | ICD-10-CM | POA: Diagnosis not present

## 2023-05-01 DIAGNOSIS — E1165 Type 2 diabetes mellitus with hyperglycemia: Secondary | ICD-10-CM

## 2023-05-01 DIAGNOSIS — I739 Peripheral vascular disease, unspecified: Secondary | ICD-10-CM | POA: Diagnosis present

## 2023-05-01 DIAGNOSIS — I70262 Atherosclerosis of native arteries of extremities with gangrene, left leg: Secondary | ICD-10-CM | POA: Diagnosis not present

## 2023-05-01 DIAGNOSIS — E1122 Type 2 diabetes mellitus with diabetic chronic kidney disease: Secondary | ICD-10-CM | POA: Diagnosis not present

## 2023-05-01 DIAGNOSIS — E0843 Diabetes mellitus due to underlying condition with diabetic autonomic (poly)neuropathy: Secondary | ICD-10-CM | POA: Diagnosis not present

## 2023-05-01 DIAGNOSIS — E119 Type 2 diabetes mellitus without complications: Secondary | ICD-10-CM

## 2023-05-01 DIAGNOSIS — E785 Hyperlipidemia, unspecified: Secondary | ICD-10-CM | POA: Diagnosis not present

## 2023-05-01 DIAGNOSIS — G4730 Sleep apnea, unspecified: Secondary | ICD-10-CM | POA: Diagnosis present

## 2023-05-01 DIAGNOSIS — M25462 Effusion, left knee: Secondary | ICD-10-CM | POA: Diagnosis not present

## 2023-05-01 DIAGNOSIS — Z91040 Latex allergy status: Secondary | ICD-10-CM

## 2023-05-01 DIAGNOSIS — K589 Irritable bowel syndrome without diarrhea: Secondary | ICD-10-CM | POA: Diagnosis present

## 2023-05-01 DIAGNOSIS — R54 Age-related physical debility: Secondary | ICD-10-CM | POA: Diagnosis present

## 2023-05-01 DIAGNOSIS — Z794 Long term (current) use of insulin: Secondary | ICD-10-CM

## 2023-05-01 DIAGNOSIS — F331 Major depressive disorder, recurrent, moderate: Secondary | ICD-10-CM | POA: Diagnosis not present

## 2023-05-01 DIAGNOSIS — Z885 Allergy status to narcotic agent status: Secondary | ICD-10-CM

## 2023-05-01 DIAGNOSIS — Z833 Family history of diabetes mellitus: Secondary | ICD-10-CM

## 2023-05-01 DIAGNOSIS — R29898 Other symptoms and signs involving the musculoskeletal system: Secondary | ICD-10-CM | POA: Insufficient documentation

## 2023-05-01 DIAGNOSIS — Z79890 Hormone replacement therapy (postmenopausal): Secondary | ICD-10-CM

## 2023-05-01 DIAGNOSIS — K219 Gastro-esophageal reflux disease without esophagitis: Secondary | ICD-10-CM | POA: Diagnosis present

## 2023-05-01 DIAGNOSIS — Z8249 Family history of ischemic heart disease and other diseases of the circulatory system: Secondary | ICD-10-CM

## 2023-05-01 DIAGNOSIS — Z79899 Other long term (current) drug therapy: Secondary | ICD-10-CM

## 2023-05-01 LAB — CBC
HCT: 32.5 % — ABNORMAL LOW (ref 36.0–46.0)
Hemoglobin: 10.9 g/dL — ABNORMAL LOW (ref 12.0–15.0)
MCH: 28.9 pg (ref 26.0–34.0)
MCHC: 33.5 g/dL (ref 30.0–36.0)
MCV: 86.2 fL (ref 80.0–100.0)
Platelets: 260 10*3/uL (ref 150–400)
RBC: 3.77 MIL/uL — ABNORMAL LOW (ref 3.87–5.11)
RDW: 16.3 % — ABNORMAL HIGH (ref 11.5–15.5)
WBC: 10 10*3/uL (ref 4.0–10.5)
nRBC: 0 % (ref 0.0–0.2)

## 2023-05-01 LAB — BASIC METABOLIC PANEL
Anion gap: 11 (ref 5–15)
BUN: 25 mg/dL — ABNORMAL HIGH (ref 8–23)
CO2: 21 mmol/L — ABNORMAL LOW (ref 22–32)
Calcium: 10.4 mg/dL — ABNORMAL HIGH (ref 8.9–10.3)
Chloride: 106 mmol/L (ref 98–111)
Creatinine, Ser: 1.13 mg/dL — ABNORMAL HIGH (ref 0.44–1.00)
GFR, Estimated: 51 mL/min — ABNORMAL LOW (ref >60)
Glucose, Bld: 205 mg/dL — ABNORMAL HIGH (ref 70–99)
Potassium: 4.3 mmol/L (ref 3.5–5.1)
Sodium: 138 mmol/L (ref 135–145)

## 2023-05-01 MED ORDER — SODIUM CHLORIDE 0.9 % IV SOLN
2.0000 g | Freq: Once | INTRAVENOUS | Status: AC
  Administered 2023-05-01: 2 g via INTRAVENOUS
  Filled 2023-05-01: qty 12.5

## 2023-05-01 MED ORDER — SODIUM CHLORIDE 0.9 % IV SOLN
2.0000 g | Freq: Two times a day (BID) | INTRAVENOUS | Status: DC
  Administered 2023-05-02 – 2023-05-03 (×3): 2 g via INTRAVENOUS
  Filled 2023-05-01 (×3): qty 12.5

## 2023-05-01 MED ORDER — ARIPIPRAZOLE 2 MG PO TABS
2.0000 mg | ORAL_TABLET | Freq: Every evening | ORAL | Status: DC
  Administered 2023-05-03: 2 mg via ORAL
  Filled 2023-05-01 (×2): qty 1

## 2023-05-01 MED ORDER — OXYCODONE-ACETAMINOPHEN 5-325 MG PO TABS
1.0000 | ORAL_TABLET | Freq: Once | ORAL | Status: AC
  Administered 2023-05-01: 1 via ORAL
  Filled 2023-05-01: qty 1

## 2023-05-01 MED ORDER — HEPARIN SODIUM (PORCINE) 5000 UNIT/ML IJ SOLN
5000.0000 [IU] | Freq: Three times a day (TID) | INTRAMUSCULAR | Status: AC
  Administered 2023-05-02: 5000 [IU] via SUBCUTANEOUS
  Filled 2023-05-01: qty 1

## 2023-05-01 MED ORDER — VANCOMYCIN HCL 1750 MG/350ML IV SOLN
1750.0000 mg | Freq: Once | INTRAVENOUS | Status: AC
  Administered 2023-05-02: 1750 mg via INTRAVENOUS
  Filled 2023-05-01 (×3): qty 350

## 2023-05-01 MED ORDER — PANTOPRAZOLE SODIUM 40 MG PO TBEC
80.0000 mg | DELAYED_RELEASE_TABLET | Freq: Every day | ORAL | Status: DC
  Administered 2023-05-02 – 2023-05-04 (×3): 80 mg via ORAL
  Filled 2023-05-01 (×3): qty 2

## 2023-05-01 MED ORDER — BUDESON-GLYCOPYRROL-FORMOTEROL 160-9-4.8 MCG/ACT IN AERO: 2.0000 | INHALATION_SPRAY | Freq: Two times a day (BID) | RESPIRATORY_TRACT | Status: DC

## 2023-05-01 MED ORDER — VITAMIN B-12 1000 MCG PO TABS
500.0000 ug | ORAL_TABLET | Freq: Every day | ORAL | Status: DC
  Administered 2023-05-02 – 2023-05-04 (×3): 500 ug via ORAL
  Filled 2023-05-01 (×3): qty 1

## 2023-05-01 MED ORDER — VANCOMYCIN HCL 750 MG/150ML IV SOLN
750.0000 mg | INTRAVENOUS | Status: DC
  Administered 2023-05-02: 750 mg via INTRAVENOUS
  Filled 2023-05-01: qty 150

## 2023-05-01 MED ORDER — VITAMIN D 25 MCG (1000 UNIT) PO TABS
1000.0000 [IU] | ORAL_TABLET | Freq: Every day | ORAL | Status: DC
  Administered 2023-05-02 – 2023-05-03 (×2): 1000 [IU] via ORAL
  Filled 2023-05-01 (×2): qty 1

## 2023-05-01 MED ORDER — INSULIN ASPART 100 UNIT/ML IJ SOLN
0.0000 [IU] | Freq: Three times a day (TID) | INTRAMUSCULAR | Status: DC
  Administered 2023-05-02 – 2023-05-03 (×3): 2 [IU] via SUBCUTANEOUS
  Administered 2023-05-03 (×2): 3 [IU] via SUBCUTANEOUS
  Administered 2023-05-04: 2 [IU] via SUBCUTANEOUS
  Filled 2023-05-01 (×6): qty 1

## 2023-05-01 MED ORDER — ACETAMINOPHEN 650 MG RE SUPP: 650.0000 mg | Freq: Four times a day (QID) | RECTAL | Status: DC | PRN

## 2023-05-01 MED ORDER — SERTRALINE HCL 50 MG PO TABS
150.0000 mg | ORAL_TABLET | Freq: Every day | ORAL | Status: DC
  Administered 2023-05-02 – 2023-05-04 (×3): 150 mg via ORAL
  Filled 2023-05-01 (×3): qty 3

## 2023-05-01 MED ORDER — SODIUM CHLORIDE 0.9 % IV SOLN: INTRAVENOUS | Status: AC

## 2023-05-01 MED ORDER — LEVOTHYROXINE SODIUM 50 MCG PO TABS: 25.0000 ug | ORAL_TABLET | Freq: Every day | ORAL | Status: DC

## 2023-05-01 MED ORDER — MONTELUKAST SODIUM 10 MG PO TABS
10.0000 mg | ORAL_TABLET | Freq: Every day | ORAL | Status: DC
  Administered 2023-05-02 – 2023-05-04 (×3): 10 mg via ORAL
  Filled 2023-05-01 (×3): qty 1

## 2023-05-01 MED ORDER — MORPHINE SULFATE (PF) 4 MG/ML IV SOLN
4.0000 mg | INTRAVENOUS | Status: AC | PRN
  Administered 2023-05-01 (×2): 4 mg via INTRAVENOUS
  Filled 2023-05-01 (×2): qty 1

## 2023-05-01 MED ORDER — ACETAMINOPHEN 325 MG PO TABS
650.0000 mg | ORAL_TABLET | Freq: Four times a day (QID) | ORAL | Status: DC | PRN
  Administered 2023-05-02: 650 mg via ORAL
  Filled 2023-05-01: qty 2

## 2023-05-01 MED ORDER — INSULIN ASPART 100 UNIT/ML IJ SOLN: 0.0000 [IU] | Freq: Every day | INTRAMUSCULAR | Status: DC

## 2023-05-01 MED ORDER — ROSUVASTATIN CALCIUM 10 MG PO TABS
40.0000 mg | ORAL_TABLET | Freq: Every day | ORAL | Status: DC
  Administered 2023-05-02 – 2023-05-04 (×3): 40 mg via ORAL
  Filled 2023-05-01: qty 2
  Filled 2023-05-01 (×3): qty 4

## 2023-05-01 MED ORDER — ONDANSETRON HCL 4 MG/2ML IJ SOLN
4.0000 mg | Freq: Four times a day (QID) | INTRAMUSCULAR | Status: DC | PRN
  Administered 2023-05-01: 4 mg via INTRAVENOUS
  Filled 2023-05-01: qty 2

## 2023-05-01 MED ORDER — TRAZODONE HCL 100 MG PO TABS
100.0000 mg | ORAL_TABLET | Freq: Every evening | ORAL | Status: DC | PRN
  Administered 2023-05-02 – 2023-05-03 (×2): 100 mg via ORAL
  Filled 2023-05-01 (×2): qty 1

## 2023-05-01 MED ORDER — METOPROLOL TARTRATE 25 MG PO TABS
25.0000 mg | ORAL_TABLET | Freq: Two times a day (BID) | ORAL | Status: DC
  Administered 2023-05-02 – 2023-05-04 (×5): 25 mg via ORAL
  Filled 2023-05-01 (×5): qty 1

## 2023-05-01 MED ORDER — AMLODIPINE BESYLATE 5 MG PO TABS
5.0000 mg | ORAL_TABLET | Freq: Every day | ORAL | Status: DC
  Administered 2023-05-02 – 2023-05-04 (×3): 5 mg via ORAL
  Filled 2023-05-01 (×3): qty 1

## 2023-05-01 MED ORDER — ONDANSETRON HCL 4 MG PO TABS
4.0000 mg | ORAL_TABLET | Freq: Four times a day (QID) | ORAL | Status: DC | PRN
  Administered 2023-05-02: 4 mg via ORAL
  Filled 2023-05-01: qty 1

## 2023-05-01 MED ORDER — OXYCODONE HCL 5 MG PO TABS: 5.0000 mg | ORAL_TABLET | Freq: Four times a day (QID) | ORAL | Status: AC | PRN

## 2023-05-01 NOTE — ED Triage Notes (Signed)
Pt to ED For wound to left big toe and second toe for the past week. Sent by podiatrist. +DM.

## 2023-05-01 NOTE — Progress Notes (Signed)
Pharmacy Antibiotic Note  Summer [name] is a 72 y.o. female admitted on 05/01/2023 with  possible osteomyelitis . PMH significant for T2DM, HTN, HLD, history of diabetic foot infection. Patient presented from podiatry clinic with concern for osteomyelitis in first and second toe of left foot. In ED, patient is afebrile with no leukocytosis. Pharmacy has been consulted for vancomycin and cefepime dosing.  Plan: Vancomycin 1750 mg IV load given in ED Start vancomycin 750 mg IV every 24 hours (eAUC 485.6, Scr 1.13, Vd 0.5 L/kg) Start cefepime 2 g IV every 12 hours based on current renal function Monitor renal function, clinical status, culture data, and LOT F/u possible podiatry intervention  Height: 5\' 2"  (157.5 cm) Weight: 93.4 kg (206 lb) IBW/kg (Calculated) : 50.1  Temp (24hrs), Avg:98.5 F (36.9 C), Min:98.1 F (36.7 C), Max:98.9 F (37.2 C)  Recent Labs  Lab 05/01/23 1501  WBC 10.0  CREATININE 1.13*    Estimated Creatinine Clearance: 46.5 mL/min (A) (by C-G formula based on SCr of 1.13 mg/dL (H)).    Allergies  Allergen Reactions   Codeine Other (See Comments)    "TRIPPED OUT"  DIDN'T LIKE THE WAY IT FELT   Atorvastatin     muscle pain   Hydrocodone     itching   Tramadol    Latex Rash   Zolpidem Other (See Comments)    Sleep walk    Antimicrobials this admission: vancomycin 8/20 >>  cefepime 8/20 >>   Dose adjustments this admission: N/A  Microbiology results: 8/20 BCx: pending  Thank you for involving pharmacy in this patient's care.   Rockwell Alexandria, PharmD Clinical Pharmacist 05/01/2023 10:19 PM

## 2023-05-01 NOTE — ED Notes (Signed)
Pt to MRI

## 2023-05-01 NOTE — Progress Notes (Signed)
PHARMACY -  BRIEF ANTIBIOTIC NOTE   Pharmacy has received consult(s) for diabetic foot infection from an ED provider.  The patient's profile has been reviewed for ht/wt/allergies/indication/available labs.    One time order(s) placed for vancomycin and cefepime  Further antibiotics/pharmacy consults should be ordered by admitting physician if indicated.                       Thank you, Rockwell Alexandria 05/01/2023  8:59 PM

## 2023-05-01 NOTE — ED Notes (Signed)
Pt remains in MRI 

## 2023-05-01 NOTE — ED Notes (Signed)
 Provided urine incontinence care. Replaced bed linen, bed pads, pt gown, and blankets. Pt repositioned, side rails up x2,  and call light placed back within reach. No further needs identified at this time.

## 2023-05-01 NOTE — ED Provider Notes (Signed)
[facility] Provider Note    Event Date/Time   First MD Initiated Contact with Patient 05/01/23 2051     (approximate)   History   Wound Check   HPI  Summer [name] is a 72 y.o. female history of diabetes Zentz to the ER for evaluation of diabetic foot wound concern for osteomyelitis after being evaluated by internal medicine office as well as podiatrist today she was sent to the ER for admission for IV antibiotics and further workup and probable operative management.  She is complaining of moderate discomfort.  Has not had any measured fevers.  Not currently on any antibiotics.     Physical Exam   Triage Vital Signs: ED Triage Vitals  Encounter Vitals Group     BP 05/01/23 1458 (!) 156/92     Systolic BP Percentile --      Diastolic BP Percentile --      Pulse Rate 05/01/23 1459 83     Resp 05/01/23 1458 18     Temp 05/01/23 1458 98.9 F (37.2 C)     Temp src --      SpO2 05/01/23 1459 100 %     Weight 05/01/23 1459 206 lb (93.4 kg)     Height 05/01/23 1459 5\' 2"  (1.575 m)     Head Circumference --      Peak Flow --      Pain Score 05/01/23 1458 10     Pain Loc --      Pain Education --      Exclude from Growth Chart --     Most recent vital signs: Vitals:   05/01/23 1459 05/01/23 2051  BP:    Pulse: 83 97  Resp:  20  Temp:    SpO2: 100% 100%     Constitutional: Alert  Eyes: Conjunctivae are normal.  Head: Atraumatic. Nose: No congestion/rhinnorhea. Mouth/Throat: Mucous membranes are moist.   Neck: Painless ROM.  Cardiovascular:   Good peripheral circulation. Respiratory: Normal respiratory effort.  No retractions.  Gastrointestinal: Soft and nontender.  Musculoskeletal: Lower extremity wrapped from podiatry office. Neurologic:  MAE spontaneously. No gross focal neurologic deficits are appreciated.  Skin:  Skin is warm, dry and intact. No rash noted. Psychiatric: Mood and affect are normal. Speech and behavior are  normal.    ED Results / Procedures / Treatments   Labs (all labs ordered are listed, but only abnormal results are displayed) Labs Reviewed  CBC - Abnormal; Notable for the following components:      Result Value   RBC 3.77 (*)    Hemoglobin 10.9 (*)    HCT 32.5 (*)    RDW 16.3 (*)    All other components within normal limits  BASIC METABOLIC PANEL - Abnormal; Notable for the following components:   CO2 21 (*)    Glucose, Bld 205 (*)    BUN 25 (*)    Creatinine, Ser 1.13 (*)    Calcium 10.4 (*)    GFR, Estimated 51 (*)    All other components within normal limits  CULTURE, BLOOD (ROUTINE X 2)  CULTURE, BLOOD (ROUTINE X 2)     EKG     RADIOLOGY    PROCEDURES:  Critical Care performed:   Procedures   MEDICATIONS ORDERED IN ED: Medications - No data to display   IMPRESSION / MDM / ASSESSMENT AND PLAN / ED COURSE  I reviewed the triage vital signs and the nursing notes.  Differential diagnosis includes, but is not limited to, cellulitis, diabetic foot wound, osteomyelitis,  Patient presenting to the ER for evaluation of symptoms as described above.  Based on symptoms, risk factors and considered above differential, this presenting complaint could reflect a potentially life-threatening illness therefore the patient will be placed on continuous pulse oximetry and telemetry for monitoring.  Laboratory evaluation will be sent to evaluate for the above complaints.  Patient does not meet septic criteria but high risk for diabetic foot wound and osteomyelitis.  Will order broad-spectrum antibiotics per podiatry recommendations will consult hospitalist for admission for further workup and management.       FINAL CLINICAL IMPRESSION(S) / ED DIAGNOSES   Final diagnoses:  Cellulitis of right lower extremity  Diabetic foot infection ([name])     Rx / DC Orders   ED Discharge Orders     None        Note:  This document was  prepared using Dragon voice recognition software and may include unintentional dictation errors.    Willy Eddy, MD 05/01/23 [phone]

## 2023-05-01 NOTE — Progress Notes (Signed)
Chief Complaint  Patient presents with   Wound Check    "It's not good.  Dr. Carlynn Purl wants me to go to the hospital.  I have something on both feet.  I have Gout in my left knee.  Need to just cut the whole left leg off."    HPI: 72 y.o. female PMHx T2DM, prior fifth toe amputation of the left foot known to our practice presenting today for onset of new ulcers that have developed to the great toe and second digit left foot.  Patient noticed them about 1 week ago.  Patient states that she has been feeling chills over the last 24-48 hours.  She says that she was unable to sleep last night due to the increased pain that she has been experiencing.    Seen earlier today by her PCP, Dr. Carlynn Purl who recommended she go to the emergency department.  She had an appointment this afternoon so decided to follow-up with me here in the office.  Past Medical History:  Diagnosis Date   Anemia    Arthritis    Asthma    B12 deficiency    Back pain    Carpal tunnel syndrome    Chronic kidney disease    Constipation    Depression    Depressive disorder    Diabetes mellitus    Dyspnea    Fluid retention    Foot pain    GERD (gastroesophageal reflux disease)    Headache    History of hiatal hernia    Hyperlipidemia    Hypertension    IBS (irritable bowel syndrome)    Insomnia    Joint pain    Lumbago    Memory loss    Obesity    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)    Pneumonia    Rhinitis, allergic    Rosacea    Sleep apnea    SOB (shortness of breath)    Thyroid disease    Unspecified hearing loss    Unspecified hereditary and idiopathic peripheral neuropathy    Unspecified sleep apnea    Vitamin D deficiency     Past Surgical History:  Procedure Laterality Date   ABDOMINAL HYSTERECTOMY  1975   ANKLE SURGERY Left approx Jan 2018   APPENDECTOMY  1970   CATARACT EXTRACTION  01/2011   right   COLONOSCOPY WITH PROPOFOL N/A 08/19/2019   Procedure: COLONOSCOPY WITH PROPOFOL;  Surgeon: Wyline Mood, MD;  Location: [facility] ENDOSCOPY;  Service: Gastroenterology;  Laterality: N/A;   eye lid surgery  2013   bilateral   FOOT SURGERY     LOWER EXTREMITY ANGIOGRAPHY Left 11/23/2020   Procedure: LOWER EXTREMITY ANGIOGRAPHY;  Surgeon: Renford Dills, MD;  Location: [facility] INVASIVE CV LAB;  Service: Cardiovascular;  Laterality: Left;   NECK SURGERY     SPINE SURGERY     TEE WITHOUT CARDIOVERSION N/A 08/16/2022   Procedure: TRANSESOPHAGEAL ECHOCARDIOGRAM (TEE);  Surgeon: Debbe Odea, MD;  Location: [facility] ORS;  Service: Cardiovascular;  Laterality: N/A;   TUBAL LIGATION     VAGINAL HYSTERECTOMY  1989    Allergies  Allergen Reactions   Codeine Other (See Comments)    "TRIPPED OUT"  DIDN'T LIKE THE WAY IT FELT   Atorvastatin     muscle pain   Hydrocodone     itching   Tramadol    Latex Rash   Zolpidem Other (See Comments)    Sleep walk    RT foot 05/01/2023  RT foot 05/01/2023  LT foot 05/01/2023  LT foot 05/01/2023   Physical Exam: General: The patient is alert and oriented x3 in no acute distress.  Dermatology: Ulcer noted to the distal tip of the great toe and the second digit.  Moderate malodor.  Surrounding erythema and edema mostly focused to the second digit. Preulcerative callus lesion also noted to the distal tip of the right third toe which appears stable.  No drainage.  Vascular: VAS [place] ABI WITH/WO TBI 12/05/2021  Neurological: Absent via light touch  Musculoskeletal Exam: Prior amputation of the left fifth digit  Assessment/Plan of Care: 1.  Ulcer with acute onset of cellulitis and likely osteomyelitis left second digit 2.  Diabetes mellitus with peripheral polyneuropathy 3.  Systemic signs of infection/possible sepsis  -Patient evaluated. -Agree with PCP, Dr. Carlynn Purl.  Recommend admission to the [facility] hospital for full workup and evaluation: Blood work, x-rays B/L, MRI toes LT foot wo contrast, IV ABX -Clinically there does not appear to be any concern  for vascular compromise. -Once admitted please contact physician on-call from our practice for consult and likely surgical amputation of the second toe at minimum       Felecia Shelling, DPM [facility]  Dr. Felecia Shelling, DPM    [address] [place], [place] [address]                Office [phone]  Fax [phone]

## 2023-05-01 NOTE — Assessment & Plan Note (Addendum)
Staff message sent to Dr. Lilian Kapur with [facility] MRI of the left foot without contrast material has been ordered Vancomycin and cefepime per pharmacy Per Dr. Logan Bores podiatry outpatient clinic, no concerns for vascular compromise therefore vascular service has not been consulted Symptomatic support: Oxycodone 5 mg every 6 hours as needed for moderate pain, 2 days ordered; morphine 4 mg IV every 4 hours as needed for severe pain, 20 hours of coverage ordered Sodium chloride infusion at 125 mL/h, 1 day ordered

## 2023-05-01 NOTE — Assessment & Plan Note (Signed)
Left knee warmth with tenderness Left knee x-ray 3 views ordered concerns for septic joint

## 2023-05-01 NOTE — ED Notes (Signed)
Ordered vancomycin has not been received from pharmacy at this time.

## 2023-05-01 NOTE — Hospital Course (Signed)
[name] is a 72 year old female with history of depression, anxiety, hypertension, insulin-dependent diabetes mellitus type 2, hypothyroid, hyperlipidemia, neuropathy, history of diabetic foot wound, who presents to the emergency department from podiatry clinic for chief concerns of osteomyelitis of the left first and second toes.  Vitals in the ED showed temperature98.9, respiration rate of 18, heart rate of 97, blood pressure 156/92, SpO2 100% on room air.  Serum sodium is 138, potassium 4.3, chloride 106, bicarb 21, BUN of 25, serum creatinine 1.13, EGFR 51, not nonfasting blood glucose 205, WBC 10.0, hemoglobin 10.9, platelets of 260.  ED treatment: Oxycodone-acetaminophen 5-325 p.o. one-time dose ordered.

## 2023-05-01 NOTE — H&P (Signed)
History and Physical   Summer [name] ZOX:[id number] DOB: September 01, 1951 DOA: 05/01/2023  PCP: Alba Cory, MD  Outpatient Specialists: Dr. Logan Bores, [facility], Patient coming from: Podiatry clinic  I have personally briefly reviewed patient's old medical records in [facility] EMR.  Chief Concern: Left toe infection  HPI: [name] is a 72 year old female with history of depression, anxiety, hypertension, insulin-dependent diabetes mellitus type 2, hypothyroid, hyperlipidemia, neuropathy, history of diabetic foot wound, who presents to the emergency department from podiatry clinic for chief concerns of osteomyelitis of the left first and second toes.  Vitals in the ED showed temperature98.9, respiration rate of 18, heart rate of 97, blood pressure 156/92, SpO2 100% on room air.  Serum sodium is 138, potassium 4.3, chloride 106, bicarb 21, BUN of 25, serum creatinine 1.13, EGFR 51, not nonfasting blood glucose 205, WBC 10.0, hemoglobin 10.9, platelets of 260.  ED treatment: Oxycodone-acetaminophen 5-325 p.o. one-time dose ordered. ------------------- At bedside patient was able to tell me her name, age, location, current, year.  She reports she noticed her left toe causing her pain about 1 week ago.  She reports she tries to wear diabetic shoes whenever she is able to get them however has not been able to do so due to not having any in the last few months.  She endorses gout in her left knee and this has been hurting her able to help her ability to ambulate.  She endorses nausea that started yesterday and denies vomiting.  She denies fever, diarrhea.  She endorses shortness of breath with exertion.  She denies dysuria, hematuria, diarrhea.  She denies blood in her stool.  Social history: She lives at home with her boyfriend of 30 years, Darrell.  She denies tobacco, EtOH, recreational drug use.  ROS: Constitutional: no weight change, no fever ENT/Mouth: no sore throat, no  rhinorrhea Eyes: no eye pain, no vision changes Cardiovascular: no chest pain, + dyspnea,  no edema, no palpitations Respiratory: no cough, no sputum, no wheezing Gastrointestinal: + nausea, no vomiting, no diarrhea, no constipation Genitourinary: no urinary incontinence, no dysuria, no hematuria Musculoskeletal: no arthralgias, no myalgias Skin: no skin lesions, no pruritus, + left 1st and 2nd toe infection Neuro: + weakness, no loss of consciousness, no syncope Psych: no anxiety, no depression, + decrease appetite Heme/Lymph: no bruising, no bleeding  ED Course: Discussed with emergency medicine provider, patient requiring hospitalization for chief concerns of left toe osteomyelitis.  Assessment/Plan  Principal Problem:   Toe osteomyelitis, left ([name]) Active Problems:   Uncontrolled type 2 diabetes mellitus with hyperglycemia, with long-term current use of insulin ([name])   Adult hypothyroidism   Charcot foot due to diabetes mellitus ([name])   MDD (major depressive disorder), recurrent episode, moderate ([name])   Diabetes mellitus ([name])   PAD (peripheral artery disease) ([name])   CKD stage 3a, GFR 45-59 ml/min ([name])   Warmth of joint   Assessment and Plan:  * Toe osteomyelitis, left [facility]) Staff message sent to Dr. Lilian Kapur with [facility] MRI of the left foot without contrast material has been ordered Vancomycin and cefepime per pharmacy Per Dr. Logan Bores podiatry outpatient clinic, no concerns for vascular compromise therefore vascular service has not been consulted Symptomatic support: Oxycodone 5 mg every 6 hours as needed for moderate pain, 2 days ordered; morphine 4 mg IV every 4 hours as needed for severe pain, 20 hours of coverage ordered Sodium chloride infusion at 125 mL/h, 1 day ordered  Warmth of joint Left knee  warmth with tenderness Left knee x-ray 3 views ordered concerns for septic joint  CKD stage 3a, GFR 45-59 ml/min ([name]) At baseline  Diabetes mellitus  ([name]) Home p.o. antiglycemic agents not resumed on admission Insulin SSI with at bedtime coverage ordered  MDD (major depressive disorder), recurrent episode, moderate ([name]) Abilify 2 mg every evening, sertraline 150 mg p.o. daily, trazodone 100 mg nightly as needed for sleep ordered  Adult hypothyroidism Levothyroxine 25 mcg daily before breakfast resumed   Chart reviewed.   DVT prophylaxis: Heparin 5000 units subcutaneous one-time dose ordered on admission; AM team to reinitiate pharmacologic DVT prophylaxis when the benefits outweigh the risk. Code Status: DNR, reviewed with ACP documents and confirmed with patient at bedside Diet: Heart healthy/carb modified; n.p.o. after midnight Family Communication: No, a phone call was offered, patient declined stating that her boyfriend knows she is in the hospital and being admitted Disposition Plan: Pending clinical course Consults called: Podiatry via staff message Admission status: MedSurg, inpatient  Past Medical History:  Diagnosis Date   Anemia    Arthritis    Asthma    B12 deficiency    Back pain    Carpal tunnel syndrome    Chronic kidney disease    Constipation    Depression    Depressive disorder    Diabetes mellitus    Dyspnea    Fluid retention    Foot pain    GERD (gastroesophageal reflux disease)    Headache    History of hiatal hernia    Hyperlipidemia    Hypertension    IBS (irritable bowel syndrome)    Insomnia    Joint pain    Lumbago    Memory loss    Obesity    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)    Pneumonia    Rhinitis, allergic    Rosacea    Sleep apnea    SOB (shortness of breath)    Thyroid disease    Unspecified hearing loss    Unspecified hereditary and idiopathic peripheral neuropathy    Unspecified sleep apnea    Vitamin D deficiency    Past Surgical History:  Procedure Laterality Date   ABDOMINAL HYSTERECTOMY  1975   ANKLE SURGERY Left approx Jan 2018   APPENDECTOMY  1970   CATARACT  EXTRACTION  01/2011   right   COLONOSCOPY WITH PROPOFOL N/A 08/19/2019   Procedure: COLONOSCOPY WITH PROPOFOL;  Surgeon: Wyline Mood, MD;  Location: [facility] ENDOSCOPY;  Service: Gastroenterology;  Laterality: N/A;   eye lid surgery  2013   bilateral   FOOT SURGERY     LOWER EXTREMITY ANGIOGRAPHY Left 11/23/2020   Procedure: LOWER EXTREMITY ANGIOGRAPHY;  Surgeon: Renford Dills, MD;  Location: [facility] INVASIVE CV LAB;  Service: Cardiovascular;  Laterality: Left;   NECK SURGERY     SPINE SURGERY     TEE WITHOUT CARDIOVERSION N/A 08/16/2022   Procedure: TRANSESOPHAGEAL ECHOCARDIOGRAM (TEE);  Surgeon: Debbe Odea, MD;  Location: [facility] ORS;  Service: Cardiovascular;  Laterality: N/A;   TUBAL LIGATION     VAGINAL HYSTERECTOMY  1989   Social History:  reports that she has never smoked. She has never used smokeless tobacco. She reports that she does not drink alcohol and does not use drugs.  Allergies  Allergen Reactions   Codeine Other (See Comments)    "TRIPPED OUT"  DIDN'T LIKE THE WAY IT FELT   Atorvastatin     muscle pain   Hydrocodone     itching   Tramadol  Latex Rash   Zolpidem Other (See Comments)    Sleep walk   Family History  Problem Relation Age of Onset   Aneurysm Mother    Aortic aneurysm Mother    Hypertension Mother    Hyperlipidemia Mother    Heart disease Mother    Obesity Mother    Heart attack Maternal Grandfather    Diabetes Maternal Grandfather    Family history: Family history reviewed and not pertinent.  Prior to Admission medications   Medication Sig Start Date End Date Taking? Authorizing Provider  acetaminophen (TYLENOL 8 HOUR) 650 MG CR tablet Take 1 tablet (650 mg total) by mouth every 8 (eight) hours as needed for pain. 10/16/18   Alba Cory, MD  amLODipine (NORVASC) 5 MG tablet Take 1 tablet (5 mg total) by mouth daily. 02/23/23   Alba Cory, MD  ARIPiprazole (ABILIFY) 2 MG tablet Take 1 tablet by mouth every evening. 04/20/23   Alba Cory, MD  ASPIRIN LOW DOSE 81 MG EC tablet TAKE 1 TABLET (81 MG TOTAL) BY MOUTH DAILY. 05/31/16   Alba Cory, MD  B-D ULTRAFINE III SHORT PEN 31G X 8 MM MISC  01/25/23   [provider]  Budeson-Glycopyrrol-Formoterol (BREZTRI AEROSPHERE) 160-9-4.8 MCG/ACT AERO Inhale 2 puffs into the lungs in the morning and at bedtime. 10/19/21   Erin Fulling, MD  buPROPion (WELLBUTRIN XL) 150 MG 24 hr tablet Take 150 mg by mouth daily.    [provider]  Cholecalciferol (VITAMIN D3) 25 MCG (1000 UT) CAPS Take 1,000 Units by mouth.    [provider]  Continuous Blood Gluc Sensor (FREESTYLE LIBRE 2 SENSOR) MISC FOR GLUCOSE MONITORING 06/27/22   [provider]  cyanocobalamin (VITAMIN B12) 500 MCG tablet Take 500 mcg by mouth daily.    [provider]  dapagliflozin propanediol (FARXIGA) 10 MG TABS tablet Take 10 mg by mouth daily.    [provider]  diclofenac Sodium (VOLTAREN) 1 % GEL Apply 2 g topically 2 (two) times daily.    [provider]  ezetimibe (ZETIA) 10 MG tablet Take 1 tablet (10 mg total) by mouth daily. 04/16/23   Alba Cory, MD  feeding supplement (ENSURE ENLIVE / ENSURE PLUS) LIQD Take 237 mLs by mouth 3 (three) times daily. 08/28/22   Almon Hercules, MD  furosemide (LASIX) 20 MG tablet Take 1 tablet (20 mg total) by mouth daily. 04/16/23   Alba Cory, MD  irbesartan-hydrochlorothiazide (AVALIDE) 300-12.5 MG tablet Take 1 tablet by mouth daily.    [provider]  levothyroxine (SYNTHROID) 25 MCG tablet Take 1 tablet (25 mcg total) by mouth daily before breakfast. Two on Sunday 07/04/22   Alba Cory, MD  metFORMIN (GLUCOPHAGE) 500 MG tablet Take 2 tablets by mouth daily with breakfast. 08/16/22   Alba Cory, MD  metoprolol tartrate (LOPRESSOR) 50 MG tablet Take 0.5 tablets (25 mg total) by mouth 2 (two) times daily. 03/27/23   Alba Cory, MD  Misc Natural Products (OSTEO BI-FLEX ADV JOINT SHIELD PO)  Take by mouth 2 (two) times daily.    [provider]  montelukast (SINGULAIR) 10 MG tablet Take 1 tablet (10 mg total) by mouth daily. 09/18/22   Alba Cory, MD  omeprazole (PRILOSEC) 40 MG capsule Take 1 capsule (40 mg total) by mouth daily. 02/23/23   Alba Cory, MD  pregabalin (LYRICA) 150 MG capsule Take 1 capsule (150 mg total) by mouth 2 (two) times daily for 10 days. 08/28/22   Candelaria Stagers  T, MD  rosuvastatin (CRESTOR) 40 MG tablet Take 1 tablet (40 mg total) by mouth daily. 04/14/22   Alba Cory, MD  sertraline (ZOLOFT) 100 MG tablet Take 1.5 tablets (150 mg total) by mouth daily. 07/04/22   Alba Cory, MD  traZODone (DESYREL) 100 MG tablet Take 1 tablet (100 mg total) by mouth at bedtime as needed for sleep. 07/04/22   Sowles, Danna Hefty, MD  TRESIBA FLEXTOUCH 200 UNIT/ML FlexTouch Pen SMARTSIG:60 Unit(s) SUB-Q Daily 01/25/23   [provider]  VASCEPA 1 g capsule Take 2 capsules (2 g total) by mouth 2 (two) times daily. 04/04/22   Alba Cory, MD    Physical Exam: Vitals:   05/01/23 1458 05/01/23 1459 05/01/23 2051 05/01/23 2205  BP: (!) 156/92   139/60  Pulse:  83 97 70  Resp: 18  20 16   Temp: 98.9 F (37.2 C)   98.5 F (36.9 C)  TempSrc:    Oral  SpO2:  100% 100% 100%  Weight:  93.4 kg    Height:  5\' 2"  (1.575 m)     Constitutional: appears age-appropriate, frail, NAD, calm Eyes: PERRL, lids and conjunctivae normal ENMT: Mucous membranes are moist. Posterior pharynx clear of any exudate or lesions. Age-appropriate dentition. Hearing appropriate Neck: normal, supple, no masses, no thyromegaly Respiratory: clear to auscultation bilaterally, no wheezing, no crackles. Normal respiratory effort. No accessory muscle use.  Cardiovascular: Regular rate and rhythm, no murmurs / rubs / gallops. No extremity edema. 2+ pedal pulses. No carotid bruits.  Abdomen: Obese abdomen, no tenderness, no masses palpated, no hepatosplenomegaly. Bowel sounds  positive.  Musculoskeletal: no clubbing / cyanosis. No joint deformity upper and lower extremities. no contractures, no atrophy. Normal muscle tone.  Decreased range of motion of the left lower extremity due to increased warmth of the left knee with tenderness Skin: no rashes, lesions, ulcers. No induration Neurologic: Sensation intact. Strength 5/5 in all 4.  Psychiatric: Normal judgment and insight. Alert and oriented x 3. Normal mood.   EKG: Not indicated at this time  Left knee x-rays on Admission: Ordered, pending completion and read  Labs on Admission: I have personally reviewed following labs  CBC: Recent Labs  Lab 05/01/23 1501  WBC 10.0  HGB 10.9*  HCT 32.5*  MCV 86.2  PLT 260   Basic Metabolic Panel: Recent Labs  Lab 05/01/23 1501  NA 138  K 4.3  CL 106  CO2 21*  GLUCOSE 205*  BUN 25*  CREATININE 1.13*  CALCIUM 10.4*   GFR: Estimated Creatinine Clearance: 46.5 mL/min (A) (by C-G formula based on SCr of 1.13 mg/dL (H)).  Urine analysis:    Component Value Date/Time   COLORURINE YELLOW (A) 08/10/2022 1810   APPEARANCEUR HAZY (A) 08/10/2022 1810   LABSPEC 1.016 08/10/2022 1810   PHURINE 5.0 08/10/2022 1810   GLUCOSEU >=500 (A) 08/10/2022 1810   HGBUR LARGE (A) 08/10/2022 1810   BILIRUBINUR NEGATIVE 08/10/2022 1810   BILIRUBINUR negative 01/30/2018 1233   KETONESUR 20 (A) 08/10/2022 1810   PROTEINUR 100 (A) 08/10/2022 1810   UROBILINOGEN negative (A) 01/30/2018 1233   NITRITE NEGATIVE 08/10/2022 1810   LEUKOCYTESUR NEGATIVE 08/10/2022 1810   This document was prepared using Dragon Voice Recognition software and may include unintentional dictation errors.  Dr. Sedalia Muta [facility]  If 7PM-7AM, please contact overnight-coverage provider If 7AM-7PM, please contact day attending provider [email or web address]  05/01/2023, 10:35 PM

## 2023-05-01 NOTE — Assessment & Plan Note (Addendum)
Home p.o. antiglycemic agents not resumed on admission Insulin SSI with at bedtime coverage ordered

## 2023-05-01 NOTE — ED Notes (Signed)
MRI tech called twice, stating pt unable to lay still for MRI due to leg pain.

## 2023-05-01 NOTE — Assessment & Plan Note (Signed)
At baseline 

## 2023-05-01 NOTE — Assessment & Plan Note (Signed)
-   Levothyroxine 25 mcg daily before breakfast resumed 

## 2023-05-01 NOTE — ED Notes (Signed)
Returned from MRI 

## 2023-05-01 NOTE — Assessment & Plan Note (Signed)
Abilify 2 mg every evening, sertraline 150 mg p.o. daily, trazodone 100 mg nightly as needed for sleep ordered

## 2023-05-02 ENCOUNTER — Inpatient Hospital Stay: Payer: No Typology Code available for payment source | Admitting: Certified Registered"

## 2023-05-02 ENCOUNTER — Encounter
Admission: EM | Disposition: A | Discharge status: Home Health | Payer: Self-pay | Source: Home / Self Care | Attending: Internal Medicine

## 2023-05-02 ENCOUNTER — Encounter: Payer: Self-pay | Admitting: Internal Medicine

## 2023-05-02 ENCOUNTER — Other Ambulatory Visit: Payer: Self-pay

## 2023-05-02 DIAGNOSIS — E1169 Type 2 diabetes mellitus with other specified complication: Secondary | ICD-10-CM | POA: Diagnosis not present

## 2023-05-02 DIAGNOSIS — M869 Osteomyelitis, unspecified: Secondary | ICD-10-CM | POA: Diagnosis not present

## 2023-05-02 DIAGNOSIS — I440 Atrioventricular block, first degree: Secondary | ICD-10-CM | POA: Diagnosis not present

## 2023-05-02 HISTORY — PX: AMPUTATION TOE: SHX6595

## 2023-05-02 LAB — SURGICAL PCR SCREEN
MRSA, PCR: NEGATIVE
Staphylococcus aureus: POSITIVE — AB

## 2023-05-02 LAB — GLUCOSE, CAPILLARY
Glucose-Capillary: 168 mg/dL — ABNORMAL HIGH (ref 70–99)
Glucose-Capillary: 194 mg/dL — ABNORMAL HIGH (ref 70–99)
Glucose-Capillary: 173 mg/dL — ABNORMAL HIGH (ref 70–99)
Glucose-Capillary: 180 mg/dL — ABNORMAL HIGH (ref 70–99)
Glucose-Capillary: 159 mg/dL — ABNORMAL HIGH (ref 70–99)

## 2023-05-02 LAB — BASIC METABOLIC PANEL
Anion gap: 9 (ref 5–15)
BUN: 27 mg/dL — ABNORMAL HIGH (ref 8–23)
CO2: 21 mmol/L — ABNORMAL LOW (ref 22–32)
Calcium: 10.3 mg/dL (ref 8.9–10.3)
Chloride: 108 mmol/L (ref 98–111)
Creatinine, Ser: 1.19 mg/dL — ABNORMAL HIGH (ref 0.44–1.00)
GFR, Estimated: 48 mL/min — ABNORMAL LOW (ref >60)
Glucose, Bld: 178 mg/dL — ABNORMAL HIGH (ref 70–99)
Potassium: 4 mmol/L (ref 3.5–5.1)
Sodium: 138 mmol/L (ref 135–145)

## 2023-05-02 LAB — CBC
HCT: 31.6 % — ABNORMAL LOW (ref 36.0–46.0)
Hemoglobin: 10.5 g/dL — ABNORMAL LOW (ref 12.0–15.0)
MCH: 28.8 pg (ref 26.0–34.0)
MCHC: 33.2 g/dL (ref 30.0–36.0)
MCV: 86.6 fL (ref 80.0–100.0)
Platelets: 238 10*3/uL (ref 150–400)
RBC: 3.65 MIL/uL — ABNORMAL LOW (ref 3.87–5.11)
RDW: 16.5 % — ABNORMAL HIGH (ref 11.5–15.5)
WBC: 10 10*3/uL (ref 4.0–10.5)
nRBC: 0 % (ref 0.0–0.2)

## 2023-05-02 SURGERY — AMPUTATION, TOE
Anesthesia: General | Site: Toe | Laterality: Left

## 2023-05-02 MED ORDER — 0.9 % SODIUM CHLORIDE (POUR BTL) OPTIME
TOPICAL | Status: DC | PRN
  Administered 2023-05-02: 500 mL

## 2023-05-02 MED ORDER — BUPIVACAINE HCL 0.5 % IJ SOLN
INTRAMUSCULAR | Status: DC | PRN
  Administered 2023-05-02: 10 mL

## 2023-05-02 MED ORDER — EZETIMIBE 10 MG PO TABS
10.0000 mg | ORAL_TABLET | Freq: Every day | ORAL | Status: DC
  Administered 2023-05-02 – 2023-05-04 (×3): 10 mg via ORAL
  Filled 2023-05-02 (×3): qty 1

## 2023-05-02 MED ORDER — DROPERIDOL 2.5 MG/ML IJ SOLN: 0.6250 mg | Freq: Once | INTRAMUSCULAR | Status: DC | PRN

## 2023-05-02 MED ORDER — IRBESARTAN-HYDROCHLOROTHIAZIDE 300-12.5 MG PO TABS: 1.0000 | ORAL_TABLET | Freq: Every day | ORAL | Status: DC

## 2023-05-02 MED ORDER — PROPOFOL 10 MG/ML IV BOLUS
INTRAVENOUS | Status: DC | PRN
  Administered 2023-05-02: 40 mg via INTRAVENOUS
  Administered 2023-05-02: 50 mg via INTRAVENOUS
  Administered 2023-05-02 (×7): 30 mg via INTRAVENOUS

## 2023-05-02 MED ORDER — OXYCODONE HCL 5 MG PO TABS: 5.0000 mg | ORAL_TABLET | Freq: Once | ORAL | Status: DC | PRN

## 2023-05-02 MED ORDER — ASPIRIN 81 MG PO TBEC
81.0000 mg | DELAYED_RELEASE_TABLET | Freq: Every day | ORAL | Status: DC
  Administered 2023-05-02 – 2023-05-04 (×3): 81 mg via ORAL
  Filled 2023-05-02 (×3): qty 1

## 2023-05-02 MED ORDER — PROPOFOL 1000 MG/100ML IV EMUL
INTRAVENOUS | Status: AC
  Filled 2023-05-02: qty 100

## 2023-05-02 MED ORDER — MUPIROCIN 2 % EX OINT
1.0000 | TOPICAL_OINTMENT | Freq: Two times a day (BID) | CUTANEOUS | Status: DC
  Administered 2023-05-02: 1 via NASAL
  Filled 2023-05-02: qty 22

## 2023-05-02 MED ORDER — FENTANYL CITRATE (PF) 100 MCG/2ML IJ SOLN: 25.0000 ug | INTRAMUSCULAR | Status: DC | PRN

## 2023-05-02 MED ORDER — BUPROPION HCL ER (XL) 150 MG PO TB24
150.0000 mg | ORAL_TABLET | Freq: Every day | ORAL | Status: DC
  Administered 2023-05-02 – 2023-05-04 (×3): 150 mg via ORAL
  Filled 2023-05-02 (×3): qty 1

## 2023-05-02 MED ORDER — PROMETHAZINE HCL 25 MG/ML IJ SOLN: 6.2500 mg | INTRAMUSCULAR | Status: DC | PRN

## 2023-05-02 MED ORDER — CHLORHEXIDINE GLUCONATE CLOTH 2 % EX PADS
6.0000 | MEDICATED_PAD | Freq: Every day | CUTANEOUS | Status: DC
  Administered 2023-05-02 – 2023-05-04 (×3): 6 via TOPICAL

## 2023-05-02 MED ORDER — FLUTICASONE FUROATE-VILANTEROL 200-25 MCG/ACT IN AEPB
1.0000 | INHALATION_SPRAY | Freq: Every day | RESPIRATORY_TRACT | Status: DC
  Administered 2023-05-02 – 2023-05-04 (×3): 1 via RESPIRATORY_TRACT
  Filled 2023-05-02: qty 28

## 2023-05-02 MED ORDER — FENTANYL CITRATE (PF) 100 MCG/2ML IJ SOLN
INTRAMUSCULAR | Status: AC
  Filled 2023-05-02: qty 2

## 2023-05-02 MED ORDER — BUPIVACAINE HCL (PF) 0.5 % IJ SOLN
INTRAMUSCULAR | Status: AC
  Filled 2023-05-02: qty 30

## 2023-05-02 MED ORDER — UMECLIDINIUM BROMIDE 62.5 MCG/ACT IN AEPB
1.0000 | INHALATION_SPRAY | Freq: Every day | RESPIRATORY_TRACT | Status: DC
  Administered 2023-05-02 – 2023-05-04 (×3): 1 via RESPIRATORY_TRACT
  Filled 2023-05-02: qty 7

## 2023-05-02 MED ORDER — MUPIROCIN 2 % EX OINT
1.0000 | TOPICAL_OINTMENT | Freq: Two times a day (BID) | CUTANEOUS | Status: DC
  Administered 2023-05-02 – 2023-05-04 (×4): 1 via NASAL
  Filled 2023-05-02: qty 22

## 2023-05-02 MED ORDER — LEVOTHYROXINE SODIUM 50 MCG PO TABS: 50.0000 ug | ORAL_TABLET | ORAL | Status: DC

## 2023-05-02 MED ORDER — HYDROCHLOROTHIAZIDE 12.5 MG PO TABS
12.5000 mg | ORAL_TABLET | Freq: Every day | ORAL | Status: DC
  Administered 2023-05-02 – 2023-05-04 (×3): 12.5 mg via ORAL
  Filled 2023-05-02 (×3): qty 1

## 2023-05-02 MED ORDER — LEVOTHYROXINE SODIUM 25 MCG PO TABS
25.0000 ug | ORAL_TABLET | ORAL | Status: DC
  Administered 2023-05-02 – 2023-05-04 (×3): 25 ug via ORAL
  Filled 2023-05-02 (×3): qty 1

## 2023-05-02 MED ORDER — FENTANYL CITRATE (PF) 100 MCG/2ML IJ SOLN
INTRAMUSCULAR | Status: DC | PRN
  Administered 2023-05-02 (×2): 25 ug via INTRAVENOUS

## 2023-05-02 MED ORDER — OXYCODONE HCL 5 MG/5ML PO SOLN: 5.0000 mg | Freq: Once | ORAL | Status: DC | PRN

## 2023-05-02 MED ORDER — LEVOTHYROXINE SODIUM 25 MCG PO TABS: 25.0000 ug | ORAL_TABLET | Freq: Every day | ORAL | Status: DC

## 2023-05-02 MED ORDER — ACETAMINOPHEN 10 MG/ML IV SOLN: 1000.0000 mg | Freq: Once | INTRAVENOUS | Status: DC | PRN

## 2023-05-02 MED ORDER — ICOSAPENT ETHYL 1 G PO CAPS
2.0000 g | ORAL_CAPSULE | Freq: Two times a day (BID) | ORAL | Status: DC
  Administered 2023-05-02 – 2023-05-04 (×5): 2 g via ORAL
  Filled 2023-05-02 (×5): qty 2

## 2023-05-02 MED ORDER — IRBESARTAN 150 MG PO TABS
300.0000 mg | ORAL_TABLET | Freq: Every day | ORAL | Status: DC
  Administered 2023-05-02 – 2023-05-04 (×3): 300 mg via ORAL
  Filled 2023-05-02 (×3): qty 2

## 2023-05-02 SURGICAL SUPPLY — 38 items
BLADE MED AGGRESSIVE (BLADE) IMPLANT
BLADE OSC/SAGITTAL MD 5.5X18 (BLADE) IMPLANT
BLADE SURG 15 STRL LF DISP TIS (BLADE) IMPLANT
BLADE SURG 15 STRL SS (BLADE)
BLADE SURG MINI STRL (BLADE) IMPLANT
BNDG ESMARCH 4 X 12 STRL LF (GAUZE/BANDAGES/DRESSINGS) ×1
BNDG ESMARCH 4X12 STRL LF (GAUZE/BANDAGES/DRESSINGS) ×1 IMPLANT
BNDG GAUZE DERMACEA FLUFF 4 (GAUZE/BANDAGES/DRESSINGS) ×1 IMPLANT
BNDG GZE DERMACEA 4 6PLY (GAUZE/BANDAGES/DRESSINGS) ×1
CNTNR URN SCR LID CUP LEK RST (MISCELLANEOUS) IMPLANT
CONT SPEC 4OZ STRL OR WHT (MISCELLANEOUS) ×1
CUFF TOURN SGL QUICK 12 (TOURNIQUET CUFF) IMPLANT
CUFF TOURN SGL QUICK 18X4 (TOURNIQUET CUFF) IMPLANT
ELECT REM PT RETURN 9FT ADLT (ELECTROSURGICAL) ×1
ELECTRODE REM PT RTRN 9FT ADLT (ELECTROSURGICAL) ×1 IMPLANT
GAUZE SPONGE 4X4 12PLY STRL (GAUZE/BANDAGES/DRESSINGS) ×2 IMPLANT
GLOVE BIOGEL M 7.0 STRL (GLOVE) ×1 IMPLANT
GLOVE BIOGEL PI IND STRL 7.5 (GLOVE) ×1 IMPLANT
GOWN STRL REUS W/ TWL LRG LVL3 (GOWN DISPOSABLE) ×2 IMPLANT
GOWN STRL REUS W/TWL LRG LVL3 (GOWN DISPOSABLE) ×2
KIT TURNOVER KIT A (KITS) ×1 IMPLANT
LABEL OR SOLS (LABEL) ×1 IMPLANT
MANIFOLD NEPTUNE II (INSTRUMENTS) ×1 IMPLANT
NDL BIOPSY JAMSHIDI 11X6 (NEEDLE) IMPLANT
NDL HYPO 25X1 1.5 SAFETY (NEEDLE) ×2 IMPLANT
NEEDLE BIOPSY JAMSHIDI 11X6 (NEEDLE) IMPLANT
NEEDLE HYPO 25X1 1.5 SAFETY (NEEDLE) ×1 IMPLANT
NS IRRIG 500ML POUR BTL (IV SOLUTION) ×1 IMPLANT
PACK EXTREMITY ARMC (MISCELLANEOUS) ×1 IMPLANT
PAD ABD DERMACEA PRESS 5X9 (GAUZE/BANDAGES/DRESSINGS) IMPLANT
SOL PREP PVP 2OZ (MISCELLANEOUS) ×1
SOLUTION PREP PVP 2OZ (MISCELLANEOUS) ×1 IMPLANT
SUT ETHILON 3-0 FS-10 30 BLK (SUTURE) ×2
SUTURE EHLN 3-0 FS-10 30 BLK (SUTURE) ×1 IMPLANT
SWAB CULTURE AMIES ANAERIB BLU (MISCELLANEOUS) IMPLANT
SYR 10ML LL (SYRINGE) ×1 IMPLANT
TRAP FLUID SMOKE EVACUATOR (MISCELLANEOUS) ×1 IMPLANT
WATER STERILE IRR 500ML POUR (IV SOLUTION) ×1 IMPLANT

## 2023-05-02 NOTE — ED Notes (Signed)
Pt requested ginger ale, provided.

## 2023-05-02 NOTE — Anesthesia Postprocedure Evaluation (Signed)
Anesthesia Post Note  Patient: Summer [name]  Procedure(s) Performed: LEFT FOOT PARTIAL AMPUTATION 1st & 2nd TOE (Left: Toe)  Patient location during evaluation: PACU Anesthesia Type: MAC Level of consciousness: awake and alert Pain management: pain level controlled Vital Signs Assessment: post-procedure vital signs reviewed and stable Respiratory status: spontaneous breathing, nonlabored ventilation, respiratory function stable and patient connected to nasal cannula oxygen Cardiovascular status: stable and blood pressure returned to baseline Postop Assessment: no apparent nausea or vomiting Anesthetic complications: no   No notable events documented.   Last Vitals:  Vitals:   05/02/23 1807 05/02/23 1815  BP: (!) 130/56 (!) 141/55  Pulse: 76 72  Resp: 19 20  Temp:  (!) 36.2 C  SpO2: 100% 100%    Last Pain:  Vitals:   05/02/23 1807  TempSrc:   PainSc: 0-No pain                 Yevette Edwards

## 2023-05-02 NOTE — Anesthesia Procedure Notes (Signed)
Procedure Name: MAC Date/Time: 05/02/2023 5:25 PM  Performed by: Katherine Basset, CRNAPre-anesthesia Checklist: Patient identified, Suction available, Emergency Drugs available and Patient being monitored Patient Re-evaluated:Patient Re-evaluated prior to induction Oxygen Delivery Method: Simple face mask Preoxygenation: Pre-oxygenation with 100% oxygen

## 2023-05-02 NOTE — Anesthesia Preprocedure Evaluation (Signed)
Anesthesia Evaluation  Patient identified by MRN, date of birth, ID band Patient awake    Reviewed: Allergy & Precautions, H&P , NPO status , Patient's Chart, lab work & pertinent test results, reviewed documented beta blocker date and time   Airway Mallampati: II  TM Distance: >3 FB Neck ROM: full    Dental no notable dental hx. (+) Poor Dentition, Implants   Pulmonary shortness of breath and with exertion, asthma , sleep apnea , pneumonia, resolved, COPD   Pulmonary exam normal breath sounds clear to auscultation       Cardiovascular Exercise Tolerance: Good hypertension, + CAD  + dysrhythmias  Rhythm:regular Rate:Normal     Neuro/Psych  Headaches, Seizures -,  PSYCHIATRIC DISORDERS       Neuromuscular disease    GI/Hepatic Neg liver ROS, hiatal hernia,GERD  Medicated,,  Endo/Other  diabetesHypothyroidism  Morbid obesity  Renal/GU Renal disease     Musculoskeletal   Abdominal   Peds  Hematology  (+) Blood dyscrasia, anemia   Anesthesia Other Findings   Reproductive/Obstetrics negative OB ROS                             Anesthesia Physical Anesthesia Plan  ASA: 4 and emergent  Anesthesia Plan: MAC   Post-op Pain Management:    Induction:   PONV Risk Score and Plan:   Airway Management Planned:   Additional Equipment:   Intra-op Plan:   Post-operative Plan:   Informed Consent: I have reviewed the patients History and Physical, chart, labs and discussed the procedure including the risks, benefits and alternatives for the proposed anesthesia with the patient or authorized representative who has indicated his/her understanding and acceptance.       Plan Discussed with: CRNA  Anesthesia Plan Comments:        Anesthesia Quick Evaluation

## 2023-05-02 NOTE — Progress Notes (Signed)
Patient returned from surgery around 6:45pm. No c/o pain upon arrival back to room. Currently 52ml/hr NS to right forearm (20 gauge). VS 141/75 76 100(RA)  98.0 Resident alert and oriented. Weight bearing as tolerated. Orthopedic boot to left foot to be worn with transfers and ambulation. Will report to next shift nurse.

## 2023-05-02 NOTE — Plan of Care (Signed)
  Problem: Skin Integrity: Goal: Risk for impaired skin integrity will decrease Outcome: Progressing   Problem: Education: Goal: Knowledge of General Education information will improve Description: Including pain rating scale, medication(s)/side effects and non-pharmacologic comfort measures Outcome: Progressing   Problem: Activity: Goal: Risk for activity intolerance will decrease Outcome: Progressing   Problem: Elimination: Goal: Will not experience complications related to urinary retention Outcome: Progressing   Problem: Pain Managment: Goal: General experience of comfort will improve Outcome: Progressing   Problem: Safety: Goal: Ability to remain free from injury will improve Outcome: Progressing   Problem: Skin Integrity: Goal: Risk for impaired skin integrity will decrease Outcome: Progressing

## 2023-05-02 NOTE — TOC Progression Note (Signed)
Transition of Care [facility]) - Progression Note    Patient Details  Name: PRICE PHILLABAUM MRN: [id number] Date of Birth: 1950/11/05  Transition of Care [facility]) CM/SW Contact  Marlowe Sax, RN Phone Number: 05/02/2023, 2:40 PM  Clinical Narrative:    Transition of Care [facility]) - Inpatient Brief Assessment   Patient Details  Name: DANIJAH AVE MRN: [id number] Date of Birth: [birth year]  Transition of Care [facility]) CM/SW Contact:    Marlowe Sax, RN Phone Number: 05/02/2023, 2:40 PM   Clinical Narrative:  TOC will follow for needs and assist with DC planning  Transition of Care Asessment: Insurance and Status: Insurance coverage has been reviewed Patient has primary care physician: Yes (Available  Alba Cory, MD) Home environment has been reviewed: Home with Boyfriend of 30 years Prior level of function:: Independent Prior/Current Home Services: No current home services Social Determinants of Health Reivew: SDOH reviewed no interventions necessary Readmission risk has been reviewed: Yes Transition of care needs: no transition of care needs at this time         Expected Discharge Plan and Services                                               Social Determinants of Health (SDOH) Interventions SDOH Screenings   Food Insecurity: No Food Insecurity (05/02/2023)  Housing: Low Risk  (05/02/2023)  Transportation Needs: No Transportation Needs (05/02/2023)  Utilities: Not At Risk (05/02/2023)  Alcohol Screen: Low Risk  (08/30/2021)  Depression (PHQ2-9): Medium Risk (05/01/2023)  Financial Resource Strain: Medium Risk (08/30/2021)  Physical Activity: Inactive (08/30/2021)  Social Connections: Moderately Isolated (08/30/2021)  Stress: No Stress Concern Present (08/30/2021)  Tobacco Use: Low Risk  (05/01/2023)    Readmission Risk Interventions    05/02/2023    2:39 PM  Readmission Risk Prevention Plan  Transportation Screening Complete  PCP or Specialist Appt  within 3-5 Days Complete  HRI or Home Care Consult Complete  Social Work Consult for Recovery Care Planning/Counseling Complete  Palliative Care Screening Not Applicable  Medication Review Oceanographer) Referral to Pharmacy

## 2023-05-02 NOTE — ED Notes (Signed)
Changed pt. depends. 

## 2023-05-02 NOTE — Progress Notes (Signed)
[facility]  - [place] [facility]   PATIENT NAME: Summer [name]    MR#:  [id number]  DATE OF BIRTH:  1951/07/09  SUBJECTIVE:   No family at bedside. Patient overall feels a bit better today. Nausea earlier. NPO for now   VITALS:  Blood pressure (!) 142/57, pulse 73, temperature 98.9 F (37.2 C), resp. rate 16, height 5\' 2"  (1.575 m), weight 93.4 kg, SpO2 98%.  PHYSICAL EXAMINATION:   GENERAL:  72 y.o.-year-old patient with no acute distress.  LUNGS: Normal breath sounds bilaterally, no wheezing CARDIOVASCULAR: S1, S2 normal. No murmur   ABDOMEN: Soft, nontender, nondistended. Bowel sounds present.  EXTREMITIES:    Left foot on 05/01/23     NEUROLOGIC: nonfocal  patient is alert and awake SKIN: as above  LABORATORY PANEL:  CBC Recent Labs  Lab 05/02/23 0124  WBC 10.0  HGB 10.5*  HCT 31.6*  PLT 238    Chemistries  Recent Labs  Lab 05/02/23 0124  NA 138  K 4.0  CL 108  CO2 21*  GLUCOSE 178*  BUN 27*  CREATININE 1.19*  CALCIUM 10.3    RADIOLOGY:  MR FOOT LEFT WO CONTRAST  Result Date: 05/02/2023 CLINICAL DATA:  Left first and second toe ulcers. EXAM: MRI OF THE LEFT FOOT WITHOUT CONTRAST TECHNIQUE: Multiplanar, multisequence MR imaging of the left forefoot was performed. No intravenous contrast was administered. COMPARISON:  Left foot x-rays dated August 10, 2022. FINDINGS: Bones/Joint/Cartilage Abnormal marrow edema with corresponding decreased T1 marrow signal involving the first and second distal phalanges. Prior fifth ray amputation. Postsurgical changes from prior Chopart joint fusion. No fracture or dislocation. Degenerative changes of the first MTP joint. No joint effusion. Ligaments Collateral ligaments are intact. Muscles and Tendons Flexor and extensor tendons are intact. No tenosynovitis. Complete fatty atrophy of the intrinsic foot muscles. Soft tissue First and second toe ulcerations with soft tissue swelling extending into the  dorsal foot. No discrete fluid collection. Scattered foci of susceptibility artifact plantar aspect foot related to chronic needle-like foreign bodies as seen on prior x-rays. No soft tissue mass. IMPRESSION: 1. First and second toe ulcerations with underlying osteomyelitis of the first and second distal phalanges. No abscess. Electronically Signed   By: Obie Dredge M.D.   On: 05/02/2023 08:42   DG Knee 3 Views Left  Result Date: 05/01/2023 CLINICAL DATA:  242330 Left knee pain 242330 [id number] Warmth of joint [id number] EXAM: LEFT KNEE - 3 VIEW COMPARISON:  X-ray left knee 08/30/2022 FINDINGS: No evidence of fracture, dislocation. Joint effusion. Severe tricompartmental degenerative changes of the knee. Soft tissues are unremarkable. IMPRESSION: 1. No acute displaced fracture or dislocation. 2. Severe tricompartmental degenerative changes of the knee with associated small joint effusion. Electronically Signed   By: Tish Frederickson M.D.   On: 05/01/2023 23:42    Assessment and Plan Shajuana Mckibbin is a 72 year old female with history of depression, anxiety, hypertension, insulin-dependent diabetes mellitus type 2, hypothyroid, hyperlipidemia, neuropathy, history of diabetic foot wound, who presents to the emergency department from podiatry clinic for chief concerns of osteomyelitis of the left first and second toes.   Diabetic foot infection/osteomyelitis -- TFA podiatry Dr. Lilian Kapur to see patient -- MRI left foot positive for First and second toe ulcerations with underlying osteomyelitis of the first and second distal phalanges. No abscess -- continue broad-spectrum antibiotic. Patient is NPO. Podiatry to discuss final plans with patient.  CKD stage 3a, GFR 45-59 ml/min ([name]) --At baseline   Diabetes mellitus (  [name]) --Home p.o. antiglycemic agents not resumed on admission --Insulin SSI with at bedtime coverage ordered   MDD (major depressive disorder), recurrent episode, moderate ([name]) --cont  Abilify, sertraline, trazodone     hypothyroidism --Levothyroxine    Procedures: Family communication : patient tells me family is informed Consults : podiatry CODE STATUS: full DVT Prophylaxis : heparin Level of care: Med-Surg Status is: Inpatient Remains inpatient appropriate because: diabetic foot infection    TOTAL TIME TAKING CARE OF THIS PATIENT: 35 minutes.  >50% time spent on counselling and coordination of care  Note: This dictation was prepared with Dragon dictation along with smaller phrase technology. Any transcriptional errors that result from this process are unintentional.  Enedina Finner M.D    [facility]   CC: Primary care physician; Alba Cory, MD

## 2023-05-02 NOTE — Consult Note (Signed)
Reason for Consult: Osteomyelitis of left foot Referring Physician: Dr. Wynona Meals is an 72 y.o. female.  HPI: Patient is known to our practice and has been under long-term care under my partner Dr. Logan Bores.  Has had recurrent ulcerations and previously underwent left fifth toe amputation.  She has been dealing with chronic ulceration on the left great toe and then recently developed 1 on the second toe as well.  This acutely worsened over the last 2 days and became red swollen and appear to be infected.  She saw her PCP and Dr. Logan Bores yesterday and they recommended proceeding to the emergency room due to the infection.  MRI was completed overnight.  Past Medical History:  Diagnosis Date   Anemia    Arthritis    Asthma    B12 deficiency    Back pain    Carpal tunnel syndrome    Chronic kidney disease    Constipation    Depression    Depressive disorder    Diabetes mellitus    Dyspnea    Fluid retention    Foot pain    GERD (gastroesophageal reflux disease)    Headache    History of hiatal hernia    Hyperlipidemia    Hypertension    IBS (irritable bowel syndrome)    Insomnia    Joint pain    Lumbago    Memory loss    Obesity    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)    Pneumonia    Rhinitis, allergic    Rosacea    Sleep apnea    SOB (shortness of breath)    Thyroid disease    Unspecified hearing loss    Unspecified hereditary and idiopathic peripheral neuropathy    Unspecified sleep apnea    Vitamin D deficiency     Past Surgical History:  Procedure Laterality Date   ABDOMINAL HYSTERECTOMY  1975   ANKLE SURGERY Left approx Jan 2018   APPENDECTOMY  1970   CATARACT EXTRACTION  01/2011   right   COLONOSCOPY WITH PROPOFOL N/A 08/19/2019   Procedure: COLONOSCOPY WITH PROPOFOL;  Surgeon: Wyline Mood, MD;  Location: [facility] ENDOSCOPY;  Service: Gastroenterology;  Laterality: N/A;   eye lid surgery  2013   bilateral   FOOT SURGERY     LOWER EXTREMITY ANGIOGRAPHY Left  11/23/2020   Procedure: LOWER EXTREMITY ANGIOGRAPHY;  Surgeon: Renford Dills, MD;  Location: [facility] INVASIVE CV LAB;  Service: Cardiovascular;  Laterality: Left;   NECK SURGERY     SPINE SURGERY     TEE WITHOUT CARDIOVERSION N/A 08/16/2022   Procedure: TRANSESOPHAGEAL ECHOCARDIOGRAM (TEE);  Surgeon: Debbe Odea, MD;  Location: [facility] ORS;  Service: Cardiovascular;  Laterality: N/A;   TUBAL LIGATION     VAGINAL HYSTERECTOMY  1989    Family History  Problem Relation Age of Onset   Aneurysm Mother    Aortic aneurysm Mother    Hypertension Mother    Hyperlipidemia Mother    Heart disease Mother    Obesity Mother    Heart attack Maternal Grandfather    Diabetes Maternal Grandfather     Social History:  reports that she has never smoked. She has never used smokeless tobacco. She reports that she does not drink alcohol and does not use drugs.  Allergies:  Allergies  Allergen Reactions   Codeine Other (See Comments)    "TRIPPED OUT"  DIDN'T LIKE THE WAY IT FELT   Atorvastatin     muscle pain  Hydrocodone     itching   Tramadol    Latex Rash   Zolpidem Other (See Comments)    Sleep walk    Medications: I have reviewed the patient's current medications.  Results for orders placed or performed during the hospital encounter of 05/01/23 (from the past 48 hour(s))  CBC     Status: Abnormal   Collection Time: 05/01/23  3:01 PM  Result Value Ref Range   WBC 10.0 4.0 - 10.5 K/uL   RBC 3.77 (L) 3.87 - 5.11 MIL/uL   Hemoglobin 10.9 (L) 12.0 - 15.0 g/dL   HCT 40.9 (L) 81.1 - 91.4 %   MCV 86.2 80.0 - 100.0 fL   MCH 28.9 26.0 - 34.0 pg   MCHC 33.5 30.0 - 36.0 g/dL   RDW 78.2 (H) 95.6 - 21.3 %   Platelets 260 150 - 400 K/uL   nRBC 0.0 0.0 - 0.2 %    Comment: Performed at [facility], [address], [place], [place] [address]  Basic metabolic panel     Status: Abnormal   Collection Time: 05/01/23  3:01 PM  Result Value Ref Range   Sodium 138 135 - 145 mmol/L    Potassium 4.3 3.5 - 5.1 mmol/L   Chloride 106 98 - 111 mmol/L   CO2 21 (L) 22 - 32 mmol/L   Glucose, Bld 205 (H) 70 - 99 mg/dL    Comment: Glucose reference range applies only to samples taken after fasting for at least 8 hours.   BUN 25 (H) 8 - 23 mg/dL   Creatinine, Ser 8.46 (H) 0.44 - 1.00 mg/dL   Calcium 96.2 (H) 8.9 - 10.3 mg/dL   GFR, Estimated 51 (L) >60 mL/min    Comment: (NOTE) Calculated using the CKD-EPI Creatinine Equation (2021)    Anion gap 11 5 - 15    Comment: Performed at [facility], [address] [place], [place] [address]  Blood culture (routine x 2)     Status: None (Preliminary result)   Collection Time: 05/01/23  9:10 PM   Specimen: BLOOD  Result Value Ref Range   Specimen Description BLOOD BLOOD RIGHT ARM    Special Requests      BOTTLES DRAWN AEROBIC AND ANAEROBIC Blood Culture adequate volume   Culture      NO GROWTH < 12 HOURS Performed at [facility], [address], [place], [place] [address]    Report Status PENDING   Blood culture (routine x 2)     Status: None (Preliminary result)   Collection Time: 05/02/23  1:24 AM   Specimen: BLOOD  Result Value Ref Range   Specimen Description BLOOD BLOOD LEFT ARM    Special Requests      BOTTLES DRAWN AEROBIC AND ANAEROBIC Blood Culture adequate volume   Culture      NO GROWTH < 12 HOURS Performed at [facility], [address] [place], [place] [address]    Report Status PENDING   Basic metabolic panel     Status: Abnormal   Collection Time: 05/02/23  1:24 AM  Result Value Ref Range   Sodium 138 135 - 145 mmol/L   Potassium 4.0 3.5 - 5.1 mmol/L   Chloride 108 98 - 111 mmol/L   CO2 21 (L) 22 - 32 mmol/L   Glucose, Bld 178 (H) 70 - 99 mg/dL    Comment: Glucose reference range applies only to samples taken after fasting for at least 8 hours.  BUN 27 (H) 8 - 23 mg/dL   Creatinine, Ser 0.27 (H) 0.44 - 1.00 mg/dL   Calcium 25.3 8.9 - 66.4 mg/dL   GFR, Estimated 48  (L) >60 mL/min    Comment: (NOTE) Calculated using the CKD-EPI Creatinine Equation (2021)    Anion gap 9 5 - 15    Comment: Performed at [facility], [address] [place], [place] [address]  CBC     Status: Abnormal   Collection Time: 05/02/23  1:24 AM  Result Value Ref Range   WBC 10.0 4.0 - 10.5 K/uL   RBC 3.65 (L) 3.87 - 5.11 MIL/uL   Hemoglobin 10.5 (L) 12.0 - 15.0 g/dL   HCT 42.5 (L) 95.6 - 38.7 %   MCV 86.6 80.0 - 100.0 fL   MCH 28.8 26.0 - 34.0 pg   MCHC 33.2 30.0 - 36.0 g/dL   RDW 56.4 (H) 33.2 - 95.1 %   Platelets 238 150 - 400 K/uL   nRBC 0.0 0.0 - 0.2 %    Comment: Performed at [facility], [address] [place], [place] [address]  Glucose, capillary     Status: Abnormal   Collection Time: 05/02/23  1:37 AM  Result Value Ref Range   Glucose-Capillary 168 (H) 70 - 99 mg/dL    Comment: Glucose reference range applies only to samples taken after fasting for at least 8 hours.  Glucose, capillary     Status: Abnormal   Collection Time: 05/02/23  8:18 AM  Result Value Ref Range   Glucose-Capillary 194 (H) 70 - 99 mg/dL    Comment: Glucose reference range applies only to samples taken after fasting for at least 8 hours.  Glucose, capillary     Status: Abnormal   Collection Time: 05/02/23 11:44 AM  Result Value Ref Range   Glucose-Capillary 173 (H) 70 - 99 mg/dL    Comment: Glucose reference range applies only to samples taken after fasting for at least 8 hours.  Surgical PCR screen     Status: Abnormal   Collection Time: 05/02/23 12:13 PM   Specimen: Nasal Mucosa; Nasal Swab  Result Value Ref Range   MRSA, PCR NEGATIVE NEGATIVE   Staphylococcus aureus POSITIVE (A) NEGATIVE    Comment: (NOTE) The Xpert SA Assay (FDA approved for NASAL specimens in patients 75 years of age and older), is one component of a comprehensive surveillance program. It is not intended to diagnose infection nor to guide or monitor treatment. Performed at [facility], [address] [place], [place] [address]     MR FOOT LEFT WO CONTRAST  Result Date: 05/02/2023 CLINICAL DATA:  Left first and second toe ulcers. EXAM: MRI OF THE LEFT FOOT WITHOUT CONTRAST TECHNIQUE: Multiplanar, multisequence MR imaging of the left forefoot was performed. No intravenous contrast was administered. COMPARISON:  Left foot x-rays dated August 10, 2022. FINDINGS: Bones/Joint/Cartilage Abnormal marrow edema with corresponding decreased T1 marrow signal involving the first and second distal phalanges. Prior fifth ray amputation. Postsurgical changes from prior Chopart joint fusion. No fracture or dislocation. Degenerative changes of the first MTP joint. No joint effusion. Ligaments Collateral ligaments are intact. Muscles and Tendons Flexor and extensor tendons are intact. No tenosynovitis. Complete fatty atrophy of the intrinsic foot muscles. Soft tissue First and second toe ulcerations with soft tissue swelling extending into the dorsal foot. No discrete fluid collection. Scattered foci of susceptibility artifact plantar aspect foot related to chronic needle-like foreign bodies as seen on prior x-rays.  No soft tissue mass. IMPRESSION: 1. First and second toe ulcerations with underlying osteomyelitis of the first and second distal phalanges. No abscess. Electronically Signed   By: Obie Dredge M.D.   On: 05/02/2023 08:42   DG Knee 3 Views Left  Result Date: 05/01/2023 CLINICAL DATA:  242330 Left knee pain 242330 [id number] Warmth of joint [id number] EXAM: LEFT KNEE - 3 VIEW COMPARISON:  X-ray left knee 08/30/2022 FINDINGS: No evidence of fracture, dislocation. Joint effusion. Severe tricompartmental degenerative changes of the knee. Soft tissues are unremarkable. IMPRESSION: 1. No acute displaced fracture or dislocation. 2. Severe tricompartmental degenerative changes of the knee with associated small joint effusion. Electronically Signed   By: Tish Frederickson M.D.   On:  05/01/2023 23:42    Review of Systems  Constitutional:  Positive for chills and fever.  Respiratory:  Negative for shortness of breath.   Cardiovascular:  Negative for chest pain.  Gastrointestinal:  Negative for nausea and vomiting.  Skin:        Redness and swelling in second toe left  All other systems reviewed and are negative.  Blood pressure (!) 140/60, pulse 73, temperature 98.9 F (37.2 C), temperature source Oral, resp. rate 16, height 5\' 2"  (1.575 m), weight 93.4 kg, SpO2 98%.  Vitals:   05/02/23 1547 05/02/23 1600  BP: (!) 142/57 (!) 140/60  Pulse: 73   Resp: 16 16  Temp: 98.9 F (37.2 C) 98.9 F (37.2 C)  SpO2: 98% 98%    General AA&O x3. Normal mood and affect.  Vascular Dorsalis pedis and posterior tibial pulses  present 2+ left  Capillary refill normal to all digits. Pedal hair growth diminished.  Neurologic Epicritic sensation grossly absent.  Dermatologic (Wound) Full-thickness ulceration distal tip of second and first toes left foot, cellulitis to the MTPJ of the second toe  Orthopedic: Motor intact BLE.     Assessment/Plan:  Osteomyelitis left first and second toes -Imaging: Studies independently reviewed -Antibiotics: On cefepime and vancomycin -WB Status: WBAT -Surgical Plan: Discussed results of the MRI with the patient in detail we discussed the presence of the osteomyelitis and treatment options including antibiotic therapy versus surgical resection, discussed that antibiotic therapy alone typically is not able to cure osteomyelitis and I would recommend partial amputation of the distal phalanx of both toes.  We discussed the risks and benefits of this.  All questions addressed.  Informed consent signed and reviewed.  Surgery will be scheduled for this evening.  Edwin Cap 05/02/2023, 5:10 PM   Best available via secure chat for questions or concerns.

## 2023-05-02 NOTE — Brief Op Note (Signed)
05/02/2023  6:11 PM  PATIENT:  Summer [name]  72 y.o. female  PRE-OPERATIVE DIAGNOSIS:  Osteomyelitis left foot  POST-OPERATIVE DIAGNOSIS:  Osteomyelitis left foot  PROCEDURE:  Procedure(s): LEFT FOOT PARTIAL AMPUTATION 1st & 2nd TOE (Left)  SURGEON:  Surgeons and Role:    * Edwin Cap, DPM - Primary  PHYSICIAN ASSISTANT:   ASSISTANTS: none   ANESTHESIA:   local and MAC  EBL:  10 mL   BLOOD ADMINISTERED:none  DRAINS: none   LOCAL MEDICATIONS USED:  MARCAINE    and Amount: 8 ml  SPECIMEN:  left toes 1/2  DISPOSITION OF SPECIMEN:  path and micro  COUNTS:  YES  TOURNIQUET:   Total Tourniquet Time Documented: Ankle (Left) - 19 minutes Total: Ankle (Left) - 19 minutes   DICTATION: .Note written in EPIC  PLAN OF CARE: Admit to inpatient   PATIENT DISPOSITION:  PACU - hemodynamically stable.   Delay start of Pharmacological VTE agent (>24hrs) due to surgical blood loss or risk of bleeding: no   Surgical cure of infection Will need 14 days abx per culture data taken today WBAT in post op shoe No dressing changes until follow up in office

## 2023-05-02 NOTE — Transfer of Care (Signed)
Immediate Anesthesia Transfer of Care Note  Patient: Summer [name]  Procedure(s) Performed: LEFT FOOT PARTIAL AMPUTATION 1st & 2nd TOE (Left: Toe)  Patient Location: PACU  Anesthesia Type:General  Level of Consciousness: awake, alert , and oriented  Airway & Oxygen Therapy: Patient Spontanous Breathing  Post-op Assessment: Report given to RN, Post -op Vital signs reviewed and stable, and Patient moving all extremities  Post vital signs: Reviewed and stable  Last Vitals:  Vitals Value Taken Time  BP 130/56 05/02/23 1807  Temp    Pulse 75 05/02/23 1811  Resp 23 05/02/23 1811  SpO2 100 % 05/02/23 1811  Vitals shown include unfiled device data.  Last Pain:  Vitals:   05/02/23 1636  TempSrc:   PainSc: 0-No pain         Complications: No notable events documented.

## 2023-05-03 ENCOUNTER — Encounter: Payer: Self-pay | Admitting: Podiatry

## 2023-05-03 DIAGNOSIS — M869 Osteomyelitis, unspecified: Secondary | ICD-10-CM | POA: Diagnosis not present

## 2023-05-03 LAB — GLUCOSE, CAPILLARY
Glucose-Capillary: 204 mg/dL — ABNORMAL HIGH (ref 70–99)
Glucose-Capillary: 250 mg/dL — ABNORMAL HIGH (ref 70–99)
Glucose-Capillary: 170 mg/dL — ABNORMAL HIGH (ref 70–99)
Glucose-Capillary: 196 mg/dL — ABNORMAL HIGH (ref 70–99)

## 2023-05-03 LAB — CREATININE, SERUM
Creatinine, Ser: 1.04 mg/dL — ABNORMAL HIGH (ref 0.44–1.00)
GFR, Estimated: 56 mL/min — ABNORMAL LOW (ref >60)

## 2023-05-03 MED ORDER — AMOXICILLIN-POT CLAVULANATE 875-125 MG PO TABS
1.0000 | ORAL_TABLET | Freq: Two times a day (BID) | ORAL | Status: DC
  Administered 2023-05-03 – 2023-05-04 (×2): 1 via ORAL
  Filled 2023-05-03 (×2): qty 1

## 2023-05-03 MED ORDER — INSULIN GLARGINE-YFGN 100 UNIT/ML ~~LOC~~ SOLN
20.0000 [IU] | Freq: Every day | SUBCUTANEOUS | Status: DC
  Administered 2023-05-03 – 2023-05-04 (×2): 20 [IU] via SUBCUTANEOUS
  Filled 2023-05-03 (×2): qty 0.2

## 2023-05-03 MED ORDER — METFORMIN HCL 500 MG PO TABS
1000.0000 mg | ORAL_TABLET | Freq: Every day | ORAL | Status: DC
  Administered 2023-05-04: 1000 mg via ORAL
  Filled 2023-05-03: qty 2

## 2023-05-03 MED ORDER — PREGABALIN 75 MG PO CAPS
150.0000 mg | ORAL_CAPSULE | Freq: Two times a day (BID) | ORAL | Status: DC
  Administered 2023-05-03 – 2023-05-04 (×3): 150 mg via ORAL
  Filled 2023-05-03 (×3): qty 2

## 2023-05-03 NOTE — Plan of Care (Signed)
  Problem: Metabolic: Goal: Ability to maintain appropriate glucose levels will improve Outcome: Not Progressing   Problem: Tissue Perfusion: Goal: Adequacy of tissue perfusion will improve Outcome: Not Progressing   Problem: Pain Managment: Goal: General experience of comfort will improve Outcome: Not Progressing

## 2023-05-03 NOTE — Op Note (Addendum)
Patient Name: Summer [name] DOB: 1951/09/01  MRN: [id number]   Date of Service: 05/01/2023 - 05/02/2023  Surgeon: Dr. Sharl Ma, DPM Assistants: None Pre-operative Diagnosis:  Osteomyelitis left foot Post-operative Diagnosis:  Osteomyelitis left foot Procedures: Procedures:   * LEFT FOOT PARTIAL AMPUTATION 1st & 2nd TOE Pathology/Specimens: ID Type Source Tests Collected by Time Destination  1 : Left foot 1st toe Tissue PATH Digit amputation SURGICAL PATHOLOGY Edwin Cap, DPM 05/02/2023 1740   2 : Left foot 2nd toe Tissue PATH Digit amputation SURGICAL PATHOLOGY Edwin Cap, DPM 05/02/2023 1744   A : Left foot 2bone culture Tissue PATH Digit amputation AEROBIC/ANAEROBIC CULTURE W GRAM STAIN (SURGICAL/DEEP WOUND) Edwin Cap, DPM 05/02/2023 1746    Anesthesia: MAC with local Hemostasis:  Total Tourniquet Time Documented: Ankle (Left) - 19 minutes Total: Ankle (Left) - 19 minutes  Estimated Blood Loss: 10 mL Materials: * No implants in log * Medications: 8 cc 0.5% Marcaine plain Complications: No complication is noted  Indications for Procedure:  This is a 72 y.o. female with a history of type 2 diabetes with recurrent ulcerations on the tips of the toes.  She most recently had developed ulceration on the first and second toe.  MRI revealed osteomyelitis.  Partial amputation was recommended.  All questions addressed prior to surgery.   Procedure in Detail: Patient was identified in pre-operative holding area. Formal consent was signed and the left lower extremity was marked. Patient was brought back to the operating room. Anesthesia was induced. The extremity was prepped and draped in the usual sterile fashion. Timeout was taken to confirm patient name, laterality, and procedure prior to incision.   Attention was then directed to the left first and second toe where an incision was made in a fishmouth style incision around the toe about the distal phalanx. Dissection was  carried down to level of bone.  Dissection was continued to the interphalangeal joint and all collateral ligaments were freed at the joint.  The bone soft tissue attachments of the distal phalanx were removed and passed for pathology.  A bone cutting forceps was used to cut the proximal on the first and middle on the second phalanx in its midsection and rasped smooth for soft tissue closure.  The area was copiously irrigated.  The skin was reapproximated with Monocryl, nylon and skin staples.   The foot was then dressed with Xeroform and dry sterile dressings. Patient tolerated the procedure well.   Disposition: Following a period of post-operative monitoring, patient will be transferred to the floor.  She may be WBAT in a postop shoe.  She is stable for discharge from our standpoint on 14 days p.o. antibiotics.  Tissue culture was taken and may be used for antibiotic therapy guidance.

## 2023-05-03 NOTE — Progress Notes (Signed)
[facility]  - [place] at [facility]   PATIENT NAME: Summer [name]    MR#:  [id number]  DATE OF BIRTH:  07-05-1951  SUBJECTIVE:   No family at bedside. Patient overall feels a bit better today. POD #1 No fever  VITALS:  Blood pressure (!) 156/62, pulse 77, temperature 97.9 F (36.6 C), resp. rate 16, height 5\' 2"  (1.575 m), weight 93.4 kg, SpO2 98%.  PHYSICAL EXAMINATION:   GENERAL:  72 y.o.-year-old patient with no acute distress.  LUNGS: Normal breath sounds bilaterally, no wheezing CARDIOVASCULAR: S1, S2 normal. No murmur   ABDOMEN: Soft, nontender, nondistended. Bowel sounds present.  EXTREMITIES:    Left foot on 05/01/23     NEUROLOGIC: nonfocal  patient is alert and awake SKIN: as above  LABORATORY PANEL:  CBC Recent Labs  Lab 05/02/23 0124  WBC 10.0  HGB 10.5*  HCT 31.6*  PLT 238    Chemistries  Recent Labs  Lab 05/02/23 0124 05/03/23 0226  NA 138  --   K 4.0  --   CL 108  --   CO2 21*  --   GLUCOSE 178*  --   BUN 27*  --   CREATININE 1.19* 1.04*  CALCIUM 10.3  --     RADIOLOGY:  MR FOOT LEFT WO CONTRAST  Result Date: 05/02/2023 CLINICAL DATA:  Left first and second toe ulcers. EXAM: MRI OF THE LEFT FOOT WITHOUT CONTRAST TECHNIQUE: Multiplanar, multisequence MR imaging of the left forefoot was performed. No intravenous contrast was administered. COMPARISON:  Left foot x-rays dated August 10, 2022. FINDINGS: Bones/Joint/Cartilage Abnormal marrow edema with corresponding decreased T1 marrow signal involving the first and second distal phalanges. Prior fifth ray amputation. Postsurgical changes from prior Chopart joint fusion. No fracture or dislocation. Degenerative changes of the first MTP joint. No joint effusion. Ligaments Collateral ligaments are intact. Muscles and Tendons Flexor and extensor tendons are intact. No tenosynovitis. Complete fatty atrophy of the intrinsic foot muscles. Soft tissue First and second toe ulcerations with  soft tissue swelling extending into the dorsal foot. No discrete fluid collection. Scattered foci of susceptibility artifact plantar aspect foot related to chronic needle-like foreign bodies as seen on prior x-rays. No soft tissue mass. IMPRESSION: 1. First and second toe ulcerations with underlying osteomyelitis of the first and second distal phalanges. No abscess. Electronically Signed   By: Obie Dredge M.D.   On: 05/02/2023 08:42   DG Knee 3 Views Left  Result Date: 05/01/2023 CLINICAL DATA:  242330 Left knee pain 242330 [id number] Warmth of joint [id number] EXAM: LEFT KNEE - 3 VIEW COMPARISON:  X-ray left knee 08/30/2022 FINDINGS: No evidence of fracture, dislocation. Joint effusion. Severe tricompartmental degenerative changes of the knee. Soft tissues are unremarkable. IMPRESSION: 1. No acute displaced fracture or dislocation. 2. Severe tricompartmental degenerative changes of the knee with associated small joint effusion. Electronically Signed   By: Tish Frederickson M.D.   On: 05/01/2023 23:42    Assessment and Plan Bernetha Dreyfuss is a 72 year old female with history of depression, anxiety, hypertension, insulin-dependent diabetes mellitus type 2, hypothyroid, hyperlipidemia, neuropathy, history of diabetic foot wound, who presents to the emergency department from podiatry clinic for chief concerns of osteomyelitis of the left first and second toes.   Diabetic foot infection/osteomyelitis -- TFA podiatry Dr. Lilian Kapur to see patient -- MRI left foot positive for First and second toe ulcerations with underlying osteomyelitis of the first and second distal phalanges. No abscess -- continue broad-spectrum antibiotic. Patient is  NPO. Podiatry to discuss final plans with patient. --8/22--pt is s/p LEFT FOOT PARTIAL AMPUTATION 1st & 2nd TOE . --ok to change to po augmentin and PT with WBAT per Dr Lilian Kapur  CKD stage 3a, GFR 45-59 ml/min ([name]) --At baseline   Diabetes mellitus ([name]), type II,  uncontrolled, peripheral neuropathy --Insulin SSI with at bedtime coverage ordered --Semglee and metformin for now -- continue Lyrica   MDD (major depressive disorder), recurrent episode, moderate ([name]) --cont Abilify, sertraline, trazodone     hypothyroidism --Levothyroxine   Start physical therapy. Discharge in 1 to 2 days.   Procedures: Family communication : patient tells me family is informed Consults : podiatry CODE STATUS: full DVT Prophylaxis : heparin Level of care: Med-Surg Status is: Inpatient Remains inpatient appropriate because: diabetic foot infection    TOTAL TIME TAKING CARE OF THIS PATIENT: 35 minutes.  >50% time spent on counselling and coordination of care  Note: This dictation was prepared with Dragon dictation along with smaller phrase technology. Any transcriptional errors that result from this process are unintentional.  Enedina Finner M.D    [facility]   CC: Primary care physician; Alba Cory, MD

## 2023-05-03 NOTE — Evaluation (Signed)
Physical Therapy Evaluation Patient Details Name: Summer [name] MRN: [id number] DOB: 1951/08/07 Today's Date: 05/03/2023  History of Present Illness  Letticia Tuzzolino is a 72yoF who comes to [facility] on 05/01/23 from outpatient podiatry after concerns for osteomyelitis. PMH: depression GAD, TN, IDDM2, hypoTSH, HLD, diabetic foot ulcer, neuropathy. MRI left foot positive for First and second toe ulcerations with underlying osteomyelitis of the first and second distal phalanges. Pt underwent amputation of Left digits 1 and 2 with Dr.McDonald DPM. Pt has orders for WBAT in postop shoe.  Clinical Impression  Pt in room on entry, reports a headache, but foot pain at goal. Pt reports having been up to toilet 2x already, no issues. Pt oriented to postop shoes and precautions, she is unable to don these on her own despite efforts. After 2-3 minutes EOB, pt has sharp onset nausea, first time today. Pt assisted back top supine, however nausea did not improve. RN made aware of request for antiemetics. Pt reports based on her earlier toilet transfers, she likely could begin AMB distance to BR toilet with nursing. Will continue to follow, commence gait training once appropriate.       If plan is discharge home, recommend the following: A little help with walking and/or transfers;Assistance with cooking/housework;Assist for transportation;Help with stairs or ramp for entrance   Can travel by private vehicle        Equipment Recommendations None recommended by PT  Recommendations for Other Services       Functional Status Assessment Patient has had a recent decline in their functional status and demonstrates the ability to make significant improvements in function in a reasonable and predictable amount of time.     Precautions / Restrictions Precautions Precautions: Fall Restrictions Weight Bearing Restrictions: Yes LLE Weight Bearing: Weight bearing as tolerated Other Position/Activity Restrictions: postop shoes  bilat      Mobility  Bed Mobility Overal bed mobility: Needs Assistance Bed Mobility: Supine to Sit, Sit to Supine     Supine to sit: Modified independent (Device/Increase time) Sit to supine: Modified independent (Device/Increase time)        Transfers                   General transfer comment: deferred, pt becomes fairly nauseated while at EOB, transitioned to supine without any improvement. Reports to have been up to commode several times today already, reports no significant difficulty or difference from recent baseline, no prior nasuea episodes.    Ambulation/Gait                  Stairs            Wheelchair Mobility     Tilt Bed    Modified Rankin (Stroke Patients Only)       Balance                                             Pertinent Vitals/Pain Pain Assessment Pain Assessment:  (reported as 10, but then talks at length about how minimal her pain is and how good things have felt since surgery.)    Home Living Family/patient expects to be discharged to:: Private residence Living Arrangements: Spouse/significant other (Darrell (boyfriend)) Available Help at Discharge: Available PRN/intermittently Type of Home: House Home Access: Ramped entrance       Home Layout: One level Home Equipment: Agricultural consultant (2  wheels);Rollator (4 wheels);Toilet riser;Wheelchair - manual;Shower seat - built in;Electric scooter;Hand held shower head Additional Comments: information taken from chart- pt with poor attention to task due to pain; hospital bed.    Prior Function Prior Level of Function : Needs assist             Mobility Comments: Some difficulty with though process at timesl has been home from rehab in May after lengthy rehab s/p spinal meningitis. ADLs Comments: modI AMB with RW, modI bathing, dressing.     Extremity/Trunk Assessment                Communication      Cognition                                                 General Comments      Exercises     Assessment/Plan    PT Assessment Patient needs continued PT services  PT Problem List Decreased strength;Decreased range of motion;Decreased activity tolerance;Decreased balance;Decreased mobility;Decreased coordination;Decreased safety awareness       PT Treatment Interventions DME instruction;Stair training;Functional mobility training;Therapeutic activities;Therapeutic exercise;Balance training;Gait training;Patient/family education    PT Goals (Current goals can be found in the Care Plan section)  Acute Rehab PT Goals Patient Stated Goal: return to home regain strength PT Goal Formulation: With patient Time For Goal Achievement: 05/17/23 Potential to Achieve Goals: Good    Frequency Min 1X/week     Co-evaluation               AM-PAC PT "6 Clicks" Mobility  Outcome Measure Help needed turning from your back to your side while in a flat bed without using bedrails?: A Little Help needed moving from lying on your back to sitting on the side of a flat bed without using bedrails?: A Little Help needed moving to and from a bed to a chair (including a wheelchair)?: A Little Help needed standing up from a chair using your arms (e.g., wheelchair or bedside chair)?: A Little Help needed to walk in hospital room?: A Lot Help needed climbing 3-5 steps with a railing? : A Lot 6 Click Score: 16    End of Session Equipment Utilized During Treatment: Gait belt Activity Tolerance: Patient tolerated treatment well;No increased pain Patient left: in bed;with call bell/phone within reach;with bed alarm set Nurse Communication: Mobility status PT Visit Diagnosis: Unsteadiness on feet (R26.81);Other abnormalities of gait and mobility (R26.89);Difficulty in walking, not elsewhere classified (R26.2)    Time: 1610-9604 PT Time Calculation (min) (ACUTE ONLY): 25 min   Charges:   PT Evaluation $PT  Eval Moderate Complexity: 1 Mod   PT General Charges $$ ACUTE PT VISIT: 1 Visit        4:08 PM, 05/03/23 Rosamaria Lints, PT, DPT Physical Therapist - [facility]  [phone] (ASCOM)    Pearle Wandler C 05/03/2023, 4:03 PM

## 2023-05-04 DIAGNOSIS — M869 Osteomyelitis, unspecified: Secondary | ICD-10-CM | POA: Diagnosis not present

## 2023-05-04 LAB — GLUCOSE, CAPILLARY: Glucose-Capillary: 191 mg/dL — ABNORMAL HIGH (ref 70–99)

## 2023-05-04 MED ORDER — AMOXICILLIN-POT CLAVULANATE 875-125 MG PO TABS: 1.0000 | ORAL_TABLET | Freq: Two times a day (BID) | ORAL | 0 refills | Status: AC

## 2023-05-04 MED ORDER — UMECLIDINIUM BROMIDE 62.5 MCG/ACT IN AEPB: 1.0000 | INHALATION_SPRAY | Freq: Every day | RESPIRATORY_TRACT | 0 refills | Status: DC

## 2023-05-04 MED ORDER — DOXYCYCLINE HYCLATE 100 MG PO TABS
100.0000 mg | ORAL_TABLET | Freq: Two times a day (BID) | ORAL | Status: DC
  Administered 2023-05-04: 100 mg via ORAL
  Filled 2023-05-04: qty 1

## 2023-05-04 MED ORDER — DOXYCYCLINE HYCLATE 100 MG PO TABS: 100.0000 mg | ORAL_TABLET | Freq: Two times a day (BID) | ORAL | 0 refills | Status: AC

## 2023-05-04 MED ORDER — FLUTICASONE FUROATE-VILANTEROL 200-25 MCG/ACT IN AEPB: 1.0000 | INHALATION_SPRAY | Freq: Every day | RESPIRATORY_TRACT | 0 refills | Status: DC

## 2023-05-04 NOTE — Care Management Important Message (Signed)
Important Message  Patient Details  Name: Summer [name] MRN: [id number] Date of Birth: 1951-07-24   Medicare Important Message Given:  Yes     [name] A Kadeem Hyle 05/04/2023, 10:51 AM

## 2023-05-04 NOTE — Progress Notes (Signed)
Subjective:  Patient ID: Summer [name], female    DOB: 1951/08/06,  MRN: [id number]  POD #2  L partial 1/2 toe amps left foot  Feeling OK overall  Negative for chest pain and shortness of breath Fever: no Constitutional signs: no Review of all other systems is negative Objective:   Vitals:   05/03/23 2351 05/04/23 0919  BP: 135/71 (!) 119/42  Pulse: 70 67  Resp: 20 19  Temp: 98.4 F (36.9 C) 98.1 F (36.7 C)  SpO2: 96% 100%   General AA&O x3. Normal mood and affect.  Vascular Dorsalis pedis and posterior tibial pulses 2/4 bilat. Brisk capillary refill to all digits. Pedal hair present.  Neurologic Epicritic sensation grossly reduced.  Dermatologic Incisions well coapted, some erythema still 2nd toe  Orthopedic: MMT 5/5 in dorsiflexion, plantarflexion, inversion, and eversion. Normal joint ROM without pain or crepitus.   Results for orders placed or performed during the hospital encounter of 05/01/23  Blood culture (routine x 2)     Status: None (Preliminary result)   Collection Time: 05/01/23  9:10 PM   Specimen: BLOOD  Result Value Ref Range Status   Specimen Description BLOOD BLOOD RIGHT ARM  Final   Special Requests   Final    BOTTLES DRAWN AEROBIC AND ANAEROBIC Blood Culture adequate volume   Culture   Final    NO GROWTH 3 DAYS Performed at [facility], [address], [place], [place] [address]    Report Status PENDING  Incomplete  Blood culture (routine x 2)     Status: None (Preliminary result)   Collection Time: 05/02/23  1:24 AM   Specimen: BLOOD  Result Value Ref Range Status   Specimen Description BLOOD BLOOD LEFT ARM  Final   Special Requests   Final    BOTTLES DRAWN AEROBIC AND ANAEROBIC Blood Culture adequate volume   Culture   Final    NO GROWTH 2 DAYS Performed at [facility], [address], [place], [place] [address]    Report Status PENDING  Incomplete  Surgical PCR screen     Status: Abnormal   Collection Time: 05/02/23  12:13 PM   Specimen: Nasal Mucosa; Nasal Swab  Result Value Ref Range Status   MRSA, PCR NEGATIVE NEGATIVE Final   Staphylococcus aureus POSITIVE (A) NEGATIVE Final    Comment: (NOTE) The Xpert SA Assay (FDA approved for NASAL specimens in patients 89 years of age and older), is one component of a comprehensive surveillance program. It is not intended to diagnose infection nor to guide or monitor treatment. Performed at [facility], [address] [place], [place] [address]   Aerobic/Anaerobic Culture w Gram Stain (surgical/deep wound)     Status: None (Preliminary result)   Collection Time: 05/02/23  5:46 PM   Specimen: PATH Digit amputation; Tissue  Result Value Ref Range Status   Specimen Description   Final    TISSUE Performed at [facility], [address], [place], [place] [address]    Special Requests   Final    NONE Performed at [facility], [address] [place], [place] [address]    Gram Stain   Final    NO WBC SEEN RARE GRAM POSITIVE COCCI RARE GRAM POSITIVE RODS    Culture   Final    MODERATE PROTEUS MIRABILIS SUSCEPTIBILITIES TO FOLLOW CULTURE REINCUBATED FOR BETTER GROWTH HOLDING FOR POSSIBLE ANAEROBE Performed at [facility], [address] [place], [place] [address]  Report Status PENDING  Incomplete     Assessment & Plan:  Patient was evaluated and treated and all questions answered.  POD #2 partial toe amputation 1/2 left foot -WBAT in post op shoes -See me next week for post op -Dressing changed today, no changes at home -Augmentin at discharge x14 days    Edwin Cap, DPM  Accessible via secure chat for questions or concerns.

## 2023-05-04 NOTE — Progress Notes (Signed)
Physical Therapy Treatment Patient Details Name: Summer [name] MRN: [id number] DOB: 1951-03-04 Today's Date: 05/04/2023   History of Present Illness Summer [name] is a 72yoF who comes to [facility] on 05/01/23 from outpatient podiatry after concerns for osteomyelitis. PMH: depression GAD, TN, IDDM2, hypoTSH, HLD, diabetic foot ulcer, neuropathy. MRI left foot positive for First and second toe ulcerations with underlying osteomyelitis of the first and second distal phalanges. Pt underwent amputation of Left digits 1 and 2 with Dr.McDonald DPM. Pt has orders for WBAT in postop shoe.    PT Comments  Pt reports AMB to BR twice today, succssful use of postop shoes and RW. This session pt demonstrating modI transfers and AMB with RW up to 164ft, but is limited by chronic left knee pain. Pt reports confidence in safe mobility at home today at DC. Discussed options for obtaining diabetic shoes in the future.     If plan is discharge home, recommend the following: A little help with walking and/or transfers;Assistance with cooking/housework;Assist for transportation;Help with stairs or ramp for entrance   Can travel by private vehicle        Equipment Recommendations  None recommended by PT    Recommendations for Other Services       Precautions / Restrictions Precautions Precautions: Fall Restrictions Weight Bearing Restrictions: Yes LLE Weight Bearing: Weight bearing as tolerated Other Position/Activity Restrictions: postop shoes bilat     Mobility  Bed Mobility                    Transfers Overall transfer level: Modified independent Equipment used: None                    Ambulation/Gait Ambulation/Gait assistance: Supervision Gait Distance (Feet): 160 Feet Assistive device: Rolling walker (2 wheels) Gait Pattern/deviations: WFL(Within Functional Limits) Gait velocity: 0.49m/s     General Gait Details: antalgic 2/2 chronic left knee pain   Stairs              Wheelchair Mobility     Tilt Bed    Modified Rankin (Stroke Patients Only)       Balance                                            Cognition                                                Exercises      General Comments        Pertinent Vitals/Pain Pain Assessment Pain Assessment: 0-10 Pain Score: 9  Pain Location: knee pain left chronic    Home Living                          Prior Function            PT Goals (current goals can now be found in the care plan section) Acute Rehab PT Goals Patient Stated Goal: return to home regain strength PT Goal Formulation: With patient Time For Goal Achievement: 05/17/23 Potential to Achieve Goals: Good Progress towards PT goals: Progressing toward goals    Frequency    Min 1X/week      PT Plan  Co-evaluation              AM-PAC PT "6 Clicks" Mobility   Outcome Measure  Help needed turning from your back to your side while in a flat bed without using bedrails?: A Little Help needed moving from lying on your back to sitting on the side of a flat bed without using bedrails?: A Little Help needed moving to and from a bed to a chair (including a wheelchair)?: A Little Help needed standing up from a chair using your arms (e.g., wheelchair or bedside chair)?: A Little Help needed to walk in hospital room?: A Lot Help needed climbing 3-5 steps with a railing? : A Lot 6 Click Score: 16    End of Session   Activity Tolerance: Patient tolerated treatment well;Patient limited by pain Patient left: with call bell/phone within reach;in chair;with chair alarm set Nurse Communication: Mobility status PT Visit Diagnosis: Unsteadiness on feet (R26.81);Other abnormalities of gait and mobility (R26.89);Difficulty in walking, not elsewhere classified (R26.2)     Time: 6045-4098 PT Time Calculation (min) (ACUTE ONLY): 15 min  Charges:    $Therapeutic  Activity: 8-22 mins PT General Charges $$ ACUTE PT VISIT: 1 Visit                    11:41 AM, 05/04/23 Rosamaria Lints, PT, DPT Physical Therapist - [facility]  [phone] (ASCOM)    [name] 05/04/2023, 11:40 AM

## 2023-05-04 NOTE — Discharge Summary (Signed)
Physician Discharge Summary   Patient: Summer [name] MRN: [id number] DOB: 01/23/1951  Admit date:     05/01/2023  Discharge date: 05/04/23  Discharge Physician: Enedina Finner   PCP: Alba Cory, MD   Recommendations at discharge:   F/u Dr Lilian Kapur Podiatry in 1 week F/u PCP in 1-2 weeks Keep dressing on till seen by Podiatry Keep log of sugars at hime  Discharge Diagnoses: Principal Problem:   Toe osteomyelitis, left ([name]) Active Problems:   Uncontrolled type 2 diabetes mellitus with hyperglycemia, with long-term current use of insulin ([name])   Adult hypothyroidism   Charcot foot due to diabetes mellitus ([name])   MDD (major depressive disorder), recurrent episode, moderate ([name])   Diabetes mellitus ([name])   PAD (peripheral artery disease) ([name])   CKD stage 3a, GFR 45-59 ml/min ([name])   Warmth of joint   Summer [name] is a 72 year old female with history of depression, anxiety, hypertension, insulin-dependent diabetes mellitus type 2, hypothyroid, hyperlipidemia, neuropathy, history of diabetic foot wound, who presents to the emergency department from podiatry clinic for chief concerns of osteomyelitis of the left first and second toes.    Diabetic foot infection/osteomyelitis -- TFA podiatry Dr. Lilian Kapur to see patient -- MRI left foot positive for First and second toe ulcerations with underlying osteomyelitis of the first and second distal phalanges. No abscess -- continue broad-spectrum antibiotic. Patient is NPO. Podiatry to discuss final plans with patient. --8/22--pt is s/p LEFT FOOT PARTIAL AMPUTATION 1st & 2nd TOE . --ok to change to po augmentin + doxycycline and PT with WBAT per Dr Lilian Kapur --HHPT  --ok to d/c per Podiatry    CKD stage 3a, GFR 45-59 ml/min ([name]) --At baseline   Diabetes mellitus ([name]), type II, uncontrolled, peripheral neuropathy --Insulin SSI with at bedtime coverage ordered --resumed home meds -- continue Lyrica   MDD (major depressive disorder),  recurrent episode, moderate ([name]) --cont Abilify, sertraline, trazodone     hypothyroidism --Levothyroxine    D/c today. Pt agreeable     Procedures:s/p LEFT FOOT PARTIAL AMPUTATION 1st & 2nd TOE . Family communication : patient tells me family is informed Consults : podiatry CODE STATUS: full DVT Prophylaxis : heparin     Pain control - [place] Controlled Substance Reporting System database was reviewed. and patient was instructed, not to drive, operate heavy machinery, perform activities at heights, swimming or participation in water activities or provide baby-sitting services while on Pain, Sleep and Anxiety Medications; until their outpatient Physician has advised to do so again. Also recommended to not to take more than prescribed Pain, Sleep and Anxiety Medications.  Diet recommendation:  Discharge Diet Orders (From admission, onward)     Start     Ordered   05/04/23 0000  Diet - low sodium heart healthy        05/04/23 1013           Cardiac and Carb modified diet DISCHARGE MEDICATION: Allergies as of 05/04/2023       Reactions   Codeine Other (See Comments)   "TRIPPED OUT"  DIDN'T LIKE THE WAY IT FELT   Atorvastatin    muscle pain   Hydrocodone    itching   Tramadol    Latex Rash   Zolpidem Other (See Comments)   Sleep walk        Medication List     STOP taking these medications    Breztri Aerosphere 160-9-4.8 MCG/ACT Aero Generic drug: Budeson-Glycopyrrol-Formoterol       TAKE these  medications    acetaminophen 650 MG CR tablet Commonly known as: Tylenol 8 Hour Take 1 tablet (650 mg total) by mouth every 8 (eight) hours as needed for pain.   amLODipine 5 MG tablet Commonly known as: NORVASC Take 1 tablet (5 mg total) by mouth daily.   amoxicillin-clavulanate 875-125 MG tablet Commonly known as: AUGMENTIN Take 1 tablet by mouth every 12 (twelve) hours for 13 days.   ARIPiprazole 2 MG tablet Commonly known as: ABILIFY Take 1  tablet by mouth every evening.   ASPIRIN LOW DOSE 81 MG tablet Generic drug: aspirin EC TAKE 1 TABLET (81 MG TOTAL) BY MOUTH DAILY.   B-D ULTRAFINE III SHORT PEN 31G X 8 MM Misc Generic drug: Insulin Pen Needle   buPROPion 150 MG 24 hr tablet Commonly known as: WELLBUTRIN XL Take 150 mg by mouth daily.   cyanocobalamin 500 MCG tablet Commonly known as: VITAMIN B12 Take 500 mcg by mouth daily.   doxycycline 100 MG tablet Commonly known as: VIBRA-TABS Take 1 tablet (100 mg total) by mouth every 12 (twelve) hours for 13 days.   ezetimibe 10 MG tablet Commonly known as: ZETIA Take 1 tablet (10 mg total) by mouth daily.   feeding supplement Liqd Take 237 mLs by mouth 3 (three) times daily.   fluticasone furoate-vilanterol 200-25 MCG/ACT Aepb Commonly known as: BREO ELLIPTA Inhale 1 puff into the lungs daily. Start taking on: May 05, 2023   FreeStyle Libre 2 Sensor Misc FOR GLUCOSE MONITORING   furosemide 20 MG tablet Commonly known as: LASIX Take 1 tablet (20 mg total) by mouth daily.   irbesartan-hydrochlorothiazide 300-12.5 MG tablet Commonly known as: AVALIDE Take 1 tablet by mouth daily.   levothyroxine 25 MCG tablet Commonly known as: SYNTHROID Take 1 tablet (25 mcg total) by mouth daily before breakfast. Two on Sunday   metFORMIN 500 MG tablet Commonly known as: GLUCOPHAGE Take 2 tablets by mouth daily with breakfast.   metoprolol tartrate 50 MG tablet Commonly known as: LOPRESSOR Take 0.5 tablets (25 mg total) by mouth 2 (two) times daily.   montelukast 10 MG tablet Commonly known as: SINGULAIR Take 1 tablet (10 mg total) by mouth daily.   omeprazole 40 MG capsule Commonly known as: PRILOSEC Take 1 capsule (40 mg total) by mouth daily.   OSTEO BI-FLEX ADV JOINT SHIELD PO Take by mouth 2 (two) times daily.   pregabalin 150 MG capsule Commonly known as: LYRICA Take 1 capsule (150 mg total) by mouth 2 (two) times daily for 10 days.    rosuvastatin 40 MG tablet Commonly known as: CRESTOR Take 1 tablet (40 mg total) by mouth daily.   sertraline 100 MG tablet Commonly known as: ZOLOFT Take 1.5 tablets (150 mg total) by mouth daily.   traZODone 100 MG tablet Commonly known as: DESYREL Take 1 tablet (100 mg total) by mouth at bedtime as needed for sleep.   Evaristo Bury FlexTouch 200 UNIT/ML FlexTouch Pen Generic drug: insulin degludec SMARTSIG:60 Unit(s) SUB-Q Daily   umeclidinium bromide 62.5 MCG/ACT Aepb Commonly known as: INCRUSE ELLIPTA Inhale 1 puff into the lungs daily. Start taking on: May 05, 2023   Vascepa 1 g capsule Generic drug: icosapent Ethyl Take 2 capsules (2 g total) by mouth 2 (two) times daily.   Vitamin D3 25 MCG (1000 UT) Caps Take 1,000 Units by mouth.   Voltaren 1 % Gel Generic drug: diclofenac Sodium Apply 2 g topically 2 (two) times daily.  Follow-up Information     Alba Cory, MD. Schedule an appointment as soon as possible for a visit in 1 week(s).   Specialty: Family Medicine Contact information: [address] [place] [address] [phone]         Edwin Cap, DPM. Schedule an appointment as soon as possible for a visit in 1 week(s).   Specialty: Podiatry Why: s/p surgery Contact information: [address] [place] [address] [phone]                Discharge Exam: Ceasar Mons Weights   05/01/23 1459 05/02/23 1636  Weight: 93.4 kg 93.4 kg   GENERAL:  72 y.o.-year-old patient with no acute distress. Obese LUNGS: Normal breath sounds bilaterally, no wheezing CARDIOVASCULAR: S1, S2 normal. No murmur   ABDOMEN: Soft, nontender, nondistended. Bowel sounds present.  EXTREMITIES:   left foot dressing+  Condition at discharge: fair  The results of significant diagnostics from this hospitalization (including imaging, microbiology, ancillary and laboratory) are listed below for reference.   Imaging Studies: MR FOOT  LEFT WO CONTRAST  Result Date: 05/02/2023 CLINICAL DATA:  Left first and second toe ulcers. EXAM: MRI OF THE LEFT FOOT WITHOUT CONTRAST TECHNIQUE: Multiplanar, multisequence MR imaging of the left forefoot was performed. No intravenous contrast was administered. COMPARISON:  Left foot x-rays dated August 10, 2022. FINDINGS: Bones/Joint/Cartilage Abnormal marrow edema with corresponding decreased T1 marrow signal involving the first and second distal phalanges. Prior fifth ray amputation. Postsurgical changes from prior Chopart joint fusion. No fracture or dislocation. Degenerative changes of the first MTP joint. No joint effusion. Ligaments Collateral ligaments are intact. Muscles and Tendons Flexor and extensor tendons are intact. No tenosynovitis. Complete fatty atrophy of the intrinsic foot muscles. Soft tissue First and second toe ulcerations with soft tissue swelling extending into the dorsal foot. No discrete fluid collection. Scattered foci of susceptibility artifact plantar aspect foot related to chronic needle-like foreign bodies as seen on prior x-rays. No soft tissue mass. IMPRESSION: 1. First and second toe ulcerations with underlying osteomyelitis of the first and second distal phalanges. No abscess. Electronically Signed   By: Obie Dredge M.D.   On: 05/02/2023 08:42   DG Knee 3 Views Left  Result Date: 05/01/2023 CLINICAL DATA:  242330 Left knee pain 242330 [id number] Warmth of joint [id number] EXAM: LEFT KNEE - 3 VIEW COMPARISON:  X-ray left knee 08/30/2022 FINDINGS: No evidence of fracture, dislocation. Joint effusion. Severe tricompartmental degenerative changes of the knee. Soft tissues are unremarkable. IMPRESSION: 1. No acute displaced fracture or dislocation. 2. Severe tricompartmental degenerative changes of the knee with associated small joint effusion. Electronically Signed   By: Tish Frederickson M.D.   On: 05/01/2023 23:42    Microbiology: Results for orders placed or performed  during the hospital encounter of 05/01/23  Blood culture (routine x 2)     Status: None (Preliminary result)   Collection Time: 05/01/23  9:10 PM   Specimen: BLOOD  Result Value Ref Range Status   Specimen Description BLOOD BLOOD RIGHT ARM  Final   Special Requests   Final    BOTTLES DRAWN AEROBIC AND ANAEROBIC Blood Culture adequate volume   Culture   Final    NO GROWTH 3 DAYS Performed at [facility], [address] [place], [place] [address]    Report Status PENDING  Incomplete  Blood culture (routine x 2)     Status: None (Preliminary result)   Collection Time: 05/02/23  1:24 AM  Specimen: BLOOD  Result Value Ref Range Status   Specimen Description BLOOD BLOOD LEFT ARM  Final   Special Requests   Final    BOTTLES DRAWN AEROBIC AND ANAEROBIC Blood Culture adequate volume   Culture   Final    NO GROWTH 2 DAYS Performed at [facility], [address] [place], [place] [address]    Report Status PENDING  Incomplete  Surgical PCR screen     Status: Abnormal   Collection Time: 05/02/23 12:13 PM   Specimen: Nasal Mucosa; Nasal Swab  Result Value Ref Range Status   MRSA, PCR NEGATIVE NEGATIVE Final   Staphylococcus aureus POSITIVE (A) NEGATIVE Final    Comment: (NOTE) The Xpert SA Assay (FDA approved for NASAL specimens in patients 54 years of age and older), is one component of a comprehensive surveillance program. It is not intended to diagnose infection nor to guide or monitor treatment. Performed at [facility], [address] [place], [place] [address]   Aerobic/Anaerobic Culture w Gram Stain (surgical/deep wound)     Status: None (Preliminary result)   Collection Time: 05/02/23  5:46 PM   Specimen: PATH Digit amputation; Tissue  Result Value Ref Range Status   Specimen Description   Final    TISSUE Performed at [facility], [address], [place], [place] [address]    Special Requests   Final    NONE Performed at [facility], [address] [place], [place] [address]    Gram Stain   Final    NO WBC SEEN RARE GRAM POSITIVE COCCI RARE GRAM POSITIVE RODS    Culture   Final    MODERATE PROTEUS MIRABILIS SUSCEPTIBILITIES TO FOLLOW CULTURE REINCUBATED FOR BETTER GROWTH Performed at [facility], [address], [place], [place] [address]    Report Status PENDING  Incomplete   *Note: Due to a large number of results and/or encounters for the requested time period, some results have not been displayed. A complete set of results can be found in Results Review.    Labs: CBC: Recent Labs  Lab 05/01/23 1501 05/02/23 0124  WBC 10.0 10.0  HGB 10.9* 10.5*  HCT 32.5* 31.6*  MCV 86.2 86.6  PLT 260 238   Basic Metabolic Panel: Recent Labs  Lab 05/01/23 1501 05/02/23 0124 05/03/23 0226  NA 138 138  --   K 4.3 4.0  --   CL 106 108  --   CO2 21* 21*  --   GLUCOSE 205* 178*  --   BUN 25* 27*  --   CREATININE 1.13* 1.19* 1.04*  CALCIUM 10.4* 10.3  --    Liver Function Tests: No results for input(s): "AST", "ALT", "ALKPHOS", "BILITOT", "PROT", "ALBUMIN" in the last 168 hours. CBG: Recent Labs  Lab 05/03/23 0942 05/03/23 1132 05/03/23 1637 05/03/23 2045 05/04/23 0819  GLUCAP 204* 250* 170* 196* 191*    Discharge time spent: greater than 30 minutes.  Signed: Enedina Finner, MD [facility] 05/04/2023

## 2023-05-04 NOTE — Evaluation (Signed)
Occupational Therapy Evaluation Patient Details Name: Summer [name] MRN: [id number] DOB: 11/30/1950 Today's Date: 05/04/2023   History of Present Illness Summer [name] is a 72yoF who [facility] on [facility]23 from outpatient podiatry after concerns for osteomyelitis. PMH: depression GAD, TN, IDDM2, hypoTSH, HLD, diabetic foot ulcer, neuropathy. MRI left foot positive for First and second toe ulcerations with underlying osteomyelitis of the first and second distal phalanges. Pt underwent amputation of Left digits 1 and 2 with Dr.McDonald DPM. Pt has orders for WBAT in postop shoe.   Clinical Impression   Summer [name] was seen for OT evaluation this date. Prior to hospital admission, pt was MOD I. Pt lives with boyfriend.Pt currently requires SUPERVISION + RW toilet t/f and standing hygiene. MIN A don B shoes. Educated on falls prevention and DME recs, will sign off. Upon hospital discharge, recommend no OT follow up.    If plan is discharge home, recommend the following: A little help with walking and/or transfers    Functional Status Assessment  Patient has had a recent decline in their functional status and demonstrates the ability to make significant improvements in function in a reasonable and predictable amount of time.  Equipment Recommendations  None recommended by OT    Recommendations for Other Services       Precautions / Restrictions Precautions Precautions: Fall Restrictions Weight Bearing Restrictions: Yes LLE Weight Bearing: Weight bearing as tolerated Other Position/Activity Restrictions: postop shoes bilat      Mobility Bed Mobility               General bed mobility comments: not tested    Transfers Overall transfer level: Modified independent                        Balance Overall balance assessment: No apparent balance deficits (not formally assessed)                                         ADL either performed or assessed with  clinical judgement   ADL Overall ADL's : Needs assistance/impaired                                       General ADL Comments: MIN A don B shoes in sitting      Pertinent Vitals/Pain Pain Assessment Pain Assessment: 0-10 Pain Score: 2  Pain Location: L knee Pain Descriptors / Indicators: Discomfort Pain Intervention(s): Limited activity within patient's tolerance     Extremity/Trunk Assessment Upper Extremity Assessment Upper Extremity Assessment: Overall WFL for tasks assessed   Lower Extremity Assessment Lower Extremity Assessment: Overall WFL for tasks assessed       Communication Communication Communication: No apparent difficulties   Cognition Arousal: Alert Behavior During Therapy: WFL for tasks assessed/performed Overall Cognitive Status: Within Functional Limits for tasks assessed                                        Home Living Family/patient expects to be discharged to:: Private residence Living Arrangements: Spouse/significant other Available Help at Discharge: Available PRN/intermittently Type of Home: House Home Access: Ramped entrance     Home Layout: One level     Bathroom  Shower/Tub: Producer, television/film/video: Standard     Home Equipment: Agricultural consultant (2 wheels);Rollator (4 wheels);Toilet riser;Wheelchair - Manufacturing systems engineer - built in;Electric scooter;Hand held shower head;Hospital bed          Prior Functioning/Environment Prior Level of Function : Needs assist                        OT Problem List: Decreased strength;Decreased range of motion;Decreased activity tolerance         OT Goals(Current goals can be found in the care plan section) Acute Rehab OT Goals Patient Stated Goal: to go home OT Goal Formulation: With patient Time For Goal Achievement: 05/18/23 Potential to Achieve Goals: Good   AM-PAC OT "6 Clicks" Daily Activity     Outcome Measure Help from another  person eating meals?: None Help from another person taking care of personal grooming?: None Help from another person toileting, which includes using toliet, bedpan, or urinal?: None Help from another person bathing (including washing, rinsing, drying)?: A Little Help from another person to put on and taking off regular upper body clothing?: None Help from another person to put on and taking off regular lower body clothing?: A Little 6 Click Score: 22   End of Session    Activity Tolerance: Patient tolerated treatment well Patient left: in chair;with call bell/phone within reach;with chair alarm set  OT Visit Diagnosis: Unsteadiness on feet (R26.81);Muscle weakness (generalized) (M62.81)                Time: 9147-8295 OT Time Calculation (min): 12 min Charges:  OT General Charges $OT Visit: 1 Visit OT Evaluation $OT Eval Low Complexity: 1 Low  Kathie Dike, M.S. OTR/L  05/04/23, 12:25 PM  ascom [phone]

## 2023-05-04 NOTE — Discharge Instructions (Signed)
Keep your dressing on till you are seen in the office as out pt Keep log of your sugars at home

## 2023-05-04 NOTE — Plan of Care (Signed)

## 2023-05-04 NOTE — TOC Progression Note (Signed)
Transition of Care [facility]) - Progression Note    Patient Details  Name: Summer [name] MRN: [id number] Date of Birth: 07-Nov-1950  [facility] CM/SW Contact  Marlowe Sax, RN Phone Number: 05/04/2023, 10:58 AM  Clinical Narrative:     Met with the patient in the room she lives at home with her S/O and has all the DME she needs, he provides transport I requested HH thru [facility], they accepted the patient however it takes 5 days to get Ins approval, Patient aware   Expected Discharge Plan: Home w Home Health Services Barriers to Discharge: No Barriers Identified  Expected Discharge Plan and Services   Discharge Planning Services: CM Consult   Living arrangements for the past 2 months: Single Family Home Expected Discharge Date: 05/04/23               DME Arranged: N/A DME Agency: NA       HH Arranged: PT HH Agency: [facility] Date HH Agency Contacted: 05/04/23 Time HH Agency Contacted: 1056 Representative spoke with at [facility] Agency: Barbara Cower   Social Determinants of Health (SDOH) Interventions SDOH Screenings   Food Insecurity: No Food Insecurity (05/02/2023)  Housing: Low Risk  (05/02/2023)  Transportation Needs: No Transportation Needs (05/02/2023)  Utilities: Not At Risk (05/02/2023)  Alcohol Screen: Low Risk  (08/30/2021)  Depression (PHQ2-9): Medium Risk (05/01/2023)  Financial Resource Strain: Medium Risk (08/30/2021)  Physical Activity: Inactive (08/30/2021)  Social Connections: Moderately Isolated (08/30/2021)  Stress: No Stress Concern Present (08/30/2021)  Tobacco Use: Low Risk  (05/02/2023)    Readmission Risk Interventions    05/02/2023    2:39 PM  Readmission Risk Prevention Plan  Transportation Screening Complete  PCP or Specialist Appt within 3-5 Days Complete  HRI or Home Care Consult Complete  Social Work Consult for Recovery Care Planning/Counseling Complete  Palliative Care Screening Not Applicable  Medication Review  Oceanographer) Referral to Pharmacy

## 2023-05-06 LAB — AEROBIC/ANAEROBIC CULTURE W GRAM STAIN (SURGICAL/DEEP WOUND): Gram Stain: NONE SEEN

## 2023-05-06 LAB — CULTURE, BLOOD (ROUTINE X 2)
Culture: NO GROWTH
Special Requests: ADEQUATE

## 2023-05-07 ENCOUNTER — Other Ambulatory Visit: Payer: Self-pay | Admitting: Family Medicine

## 2023-05-07 ENCOUNTER — Telehealth: Payer: Self-pay

## 2023-05-07 DIAGNOSIS — F5105 Insomnia due to other mental disorder: Secondary | ICD-10-CM

## 2023-05-07 LAB — CULTURE, BLOOD (ROUTINE X 2)
Culture: NO GROWTH
Special Requests: ADEQUATE

## 2023-05-07 NOTE — Transitions of Care (Post Inpatient/ED Visit) (Signed)
05/07/2023  Name: Summer [name] MRN: [id number] DOB: Jan 26, 1951  Today's TOC FU Call Status: Today's TOC FU Call Status:: Successful TOC FU Call Completed TOC FU Call Complete Date: 05/07/23 Patient's Name and Date of Birth confirmed.  Transition Care Management Follow-up Telephone Call Date of Discharge: 05/04/23 Discharge Facility: [facility] Type of Discharge: Inpatient Admission Primary Inpatient Discharge Diagnosis:: osteomylitis of toes with amputation How have you been since you were released from the hospital?: Worse Any questions or concerns?: Yes Patient Questions/Concerns:: has diarrhea  which she thinks is from antibiotic Patient Questions/Concerns Addressed: Provided Patient Educational Materials, Other: (reviewed live culture yougurt.)  Items Reviewed: Did you receive and understand the discharge instructions provided?: Yes Any new allergies since your discharge?: No Dietary orders reviewed?: Yes Type of Diet Ordered:: low carb, Do you have support at home?: Yes People in Home: significant other Name of Support/Comfort Primary Source: [name] Reviewed CBG level of 106-240.  Reviewed concern for diarrhea.  Medications Reviewed Today: Medications Reviewed Today     Reviewed by Earlie Server, RN (Registered Nurse) on 05/07/23 at 1418  Med List Status: <None>   Medication Order Taking? Sig Documenting Provider Last Dose Status Informant  acetaminophen (TYLENOL 8 HOUR) 650 MG CR tablet [id number] Yes Take 1 tablet (650 mg total) by mouth every 8 (eight) hours as needed for pain. Alba Cory, MD Taking Active Self  amLODipine (NORVASC) 5 MG tablet [id number] Yes Take 1 tablet (5 mg total) by mouth daily. Alba Cory, MD Taking Active Self  amoxicillin-clavulanate (AUGMENTIN) 875-125 MG tablet [id number] Yes Take 1 tablet by mouth every 12 (twelve) hours for 13 days. Enedina Finner, MD Taking Active   ARIPiprazole (ABILIFY) 2 MG tablet  [id number] Yes Take 1 tablet by mouth every evening. Alba Cory, MD Taking Active Self  ASPIRIN LOW DOSE 81 MG EC tablet [id number] Yes TAKE 1 TABLET (81 MG TOTAL) BY MOUTH DAILY. Alba Cory, MD Taking Active Self  B-D ULTRAFINE III SHORT PEN 31G X 8 MM MISC [id number] Yes  [provider] Taking Active Self  buPROPion (WELLBUTRIN XL) 150 MG 24 hr tablet [id number] Yes Take 150 mg by mouth daily. [provider] Taking Active Self  Cholecalciferol (VITAMIN D3) 25 MCG (1000 UT) CAPS [id number] Yes Take 1,000 Units by mouth daily. [provider] Taking Active Self  Continuous Blood Gluc Sensor (FREESTYLE LIBRE 2 SENSOR) [place] [id number] Yes FOR GLUCOSE MONITORING [provider] Taking Active Self  cyanocobalamin (VITAMIN B12) 500 MCG tablet [id number] Yes Take 500 mcg by mouth daily. [provider] Taking Active Self  diclofenac Sodium (VOLTAREN) 1 % GEL [id number] Yes Apply 2 g topically 2 (two) times daily. [provider] Taking Active Self  doxycycline (VIBRA-TABS) 100 MG tablet [id number] Yes Take 1 tablet (100 mg total) by mouth every 12 (twelve) hours for 13 days. Enedina Finner, MD Taking Active   ezetimibe (ZETIA) 10 MG tablet [id number] Yes Take 1 tablet (10 mg total) by mouth daily. Alba Cory, MD Taking Active Self  feeding supplement (ENSURE ENLIVE / ENSURE PLUS) LIQD [id number] Yes Take 237 mLs by mouth 3 (three) times daily. Almon Hercules, MD Taking Active Self  fluticasone furoate-vilanterol (BREO ELLIPTA) 200-25 MCG/ACT AEPB [id number] Yes Inhale 1 puff into the lungs daily. Enedina Finner, MD Taking Active   furosemide (LASIX) 20 MG tablet [id number] Yes Take 1 tablet (20 mg total) by mouth daily. Alba Cory, MD Taking Active Self  irbesartan-hydrochlorothiazide (AVALIDE) 300-12.5 MG  tablet [id number] No Take 1 tablet by mouth daily.  Patient not taking: Reported on 05/07/2023   [provider] Not Taking Active Self   levothyroxine (SYNTHROID) 25 MCG tablet [id number] Yes Take 1 tablet (25 mcg total) by mouth daily before breakfast. Two on Sunday Alba Cory, MD Taking Active Self  metFORMIN (GLUCOPHAGE) 500 MG tablet [id number] Yes Take 2 tablets by mouth daily with breakfast. Alba Cory, MD Taking Active Self  metoprolol tartrate (LOPRESSOR) 50 MG tablet [id number] Yes Take 0.5 tablets (25 mg total) by mouth 2 (two) times daily. Alba Cory, MD Taking Active Self  Misc Natural Products (OSTEO BI-FLEX ADV JOINT SHIELD PO) [id number] Yes Take by mouth 2 (two) times daily. [provider] Taking Active Self  montelukast (SINGULAIR) 10 MG tablet [id number] Yes Take 1 tablet (10 mg total) by mouth daily. Alba Cory, MD Taking Active Self  omeprazole (PRILOSEC) 40 MG capsule [id number] Yes Take 1 capsule (40 mg total) by mouth daily. Alba Cory, MD Taking Active Self  pregabalin (LYRICA) 150 MG capsule [id number] Yes Take 1 capsule (150 mg total) by mouth 2 (two) times daily for 10 days. Almon Hercules, MD Taking Active Self  Probiotic Product (PROBIOTIC ADVANCED PO) [id number] Yes Take 1 tablet by mouth daily. [provider]  Active   rosuvastatin (CRESTOR) 40 MG tablet [id number] Yes Take 1 tablet (40 mg total) by mouth daily. Alba Cory, MD Taking Active Self  sertraline (ZOLOFT) 100 MG tablet [id number] Yes Take 1.5 tablets (150 mg total) by mouth daily. Alba Cory, MD Taking Active Self  traZODone (DESYREL) 100 MG tablet [id number] Yes Take 1 tablet (100 mg total) by mouth at bedtime as needed for sleep. Alba Cory, MD Taking Active Self  TRESIBA FLEXTOUCH 200 UNIT/ML FlexTouch Pen [id number] Yes SMARTSIG:60 Unit(s) SUB-Q Daily [provider] Taking Active Self  umeclidinium bromide (INCRUSE ELLIPTA) 62.5 MCG/ACT AEPB [id number] No Inhale 1 puff into the lungs daily.  Patient not taking: Reported on 05/07/2023   Enedina Finner, MD Not Taking Active   VASCEPA 1 g  capsule [id number] Yes Take 2 capsules (2 g total) by mouth 2 (two) times daily. Alba Cory, MD Taking Active Self            Home Care and Equipment/Supplies: Were Home Health Services Ordered?: Yes Name of Home Health Agency:: [phone] Has Agency set up a time to come to your home?: No EMR reviewed for Home Health Orders:  (pending insurance approval for [facility]) Any new equipment or medical supplies ordered?: No  Functional Questionnaire: Do you need assistance with bathing/showering or dressing?: No Do you need assistance with meal preparation?: No Do you need assistance with eating?: No Do you have difficulty maintaining continence: No Do you need assistance with getting out of bed/getting out of a chair/moving?: No (reviewed how to use hospital bed to get upright.) Do you have difficulty managing or taking your medications?: No  Follow up appointments reviewed: PCP Follow-up appointment confirmed?: No MD Provider Line [phone] Given: No Specialist Hospital Follow-up appointment confirmed?: Yes Date of Specialist follow-up appointment?: 05/09/23 Follow-Up Specialty Provider:: [facility].  Dr.Adam Mcdonald Do you need transportation to your follow-up appointment?: No Do you understand care options if your condition(s) worsen?: Yes-patient verbalized understanding  SDOH Interventions Today    Flowsheet Row Most Recent Value  SDOH Interventions   Food Insecurity Interventions Intervention Not Indicated  Transportation Interventions Intervention Not Indicated      Interventions Today    Flowsheet  Row Most Recent Value  Chronic Disease   Chronic disease during today's visit Diabetes, Other  [post op toe ampuation]  Exercise Interventions   Exercise Discussed/Reviewed Physical Activity, Assistive device use and maintanence  Physical Activity Discussed/Reviewed Types of exercise  [waiting for PT To start]  Education Interventions   Education  Provided Provided Education  [reviewed when to call 911.  encourage PCP follow up]  Provided Verbal Education On Nutrition, Foot Care, Blood Sugar Monitoring, Exercise, Medication, When to see the doctor  Nutrition Interventions   Nutrition Discussed/Reviewed Nutrition Discussed, Increasing proteins  [Reviewed importance of yogurt to help with diarrhea]  Pharmacy Interventions   Pharmacy Dicussed/Reviewed Medications and their functions  Safety Interventions   Safety Discussed/Reviewed Fall Risk, Home Safety       TOC Interventions Today    Flowsheet Row Most Recent Value  TOC Interventions   TOC Interventions Discussed/Reviewed TOC Interventions Discussed, TOC Interventions Reviewed, Arranged PCP follow up within 7 days/Care Guide scheduled, Post discharge activity limitations per provider, Post op wound/incision care, S/S of infection     Post hospital PCP appointment scheduled.  Provided my contact information for patient to call me if needed.   Rowe Pavy, RN, BSN, CEN [facility] [phone]

## 2023-05-09 ENCOUNTER — Ambulatory Visit (INDEPENDENT_AMBULATORY_CARE_PROVIDER_SITE_OTHER): Payer: No Typology Code available for payment source | Admitting: Podiatry

## 2023-05-09 DIAGNOSIS — Z91199 Patient's noncompliance with other medical treatment and regimen due to unspecified reason: Secondary | ICD-10-CM

## 2023-05-10 LAB — SURGICAL PATHOLOGY

## 2023-05-10 NOTE — Progress Notes (Signed)
Patient was no-show for appointment today 

## 2023-05-11 ENCOUNTER — Telehealth: Payer: Self-pay | Admitting: Family Medicine

## 2023-05-11 NOTE — Telephone Encounter (Signed)
Coralee North calling from [facility] is calling to get order for PT & OT tomorrow with be first visit.  CB- [phone]

## 2023-05-11 NOTE — Telephone Encounter (Signed)
Verbal order given, per Dr. Carlynn Purl

## 2023-05-12 ENCOUNTER — Other Ambulatory Visit: Payer: Self-pay | Admitting: Family Medicine

## 2023-05-12 DIAGNOSIS — Z89422 Acquired absence of other left toe(s): Secondary | ICD-10-CM | POA: Diagnosis not present

## 2023-05-12 DIAGNOSIS — E11610 Type 2 diabetes mellitus with diabetic neuropathic arthropathy: Secondary | ICD-10-CM | POA: Diagnosis not present

## 2023-05-12 DIAGNOSIS — E1151 Type 2 diabetes mellitus with diabetic peripheral angiopathy without gangrene: Secondary | ICD-10-CM | POA: Diagnosis not present

## 2023-05-12 DIAGNOSIS — Z7984 Long term (current) use of oral hypoglycemic drugs: Secondary | ICD-10-CM | POA: Diagnosis not present

## 2023-05-12 DIAGNOSIS — Z794 Long term (current) use of insulin: Secondary | ICD-10-CM | POA: Diagnosis not present

## 2023-05-12 DIAGNOSIS — Z4781 Encounter for orthopedic aftercare following surgical amputation: Secondary | ICD-10-CM | POA: Diagnosis not present

## 2023-05-12 DIAGNOSIS — E1122 Type 2 diabetes mellitus with diabetic chronic kidney disease: Secondary | ICD-10-CM | POA: Diagnosis not present

## 2023-05-12 DIAGNOSIS — I70203 Unspecified atherosclerosis of native arteries of extremities, bilateral legs: Secondary | ICD-10-CM | POA: Diagnosis not present

## 2023-05-12 DIAGNOSIS — N1831 Chronic kidney disease, stage 3a: Secondary | ICD-10-CM | POA: Diagnosis not present

## 2023-05-12 DIAGNOSIS — E1140 Type 2 diabetes mellitus with diabetic neuropathy, unspecified: Secondary | ICD-10-CM | POA: Diagnosis not present

## 2023-05-15 ENCOUNTER — Telehealth: Payer: Self-pay | Admitting: Family Medicine

## 2023-05-15 NOTE — Telephone Encounter (Unsigned)
Copied from CRM [id number]. Topic: Quick Communication - Home Health Verbal Orders >> May 15, 2023  9:50 AM Summer [name] wrote: Caller/Agency: Summer [name] with Summer [name] Number: [phone] Requesting OT/PT/Skilled Nursing/Social Work/Speech Therapy: PT Frequency: 1 x 4  Per Summer [name] pt declined OT. Pt reports 8 of 10 left knee pain.  MIssing medications that were not in the home but listed on discharge: irbesartan-hydrochlorothiazide (AVALIDE) 300-12.5 MG tablet, montelukast (SINGULAIR) 10 MG tablet, rosuvastatin (CRESTOR) 40 MG tablet, VASCEPA 1 g capsule.Summer [name] also reports that the directions for TRESIBA FLEXTOUCH 200 UNIT/ML FlexTouch Pen is different and pt reports only taking 28 units

## 2023-05-15 NOTE — Telephone Encounter (Signed)
Verbal order given per Dr. Sowles.  ?

## 2023-05-16 DIAGNOSIS — Z7984 Long term (current) use of oral hypoglycemic drugs: Secondary | ICD-10-CM | POA: Diagnosis not present

## 2023-05-16 DIAGNOSIS — E1151 Type 2 diabetes mellitus with diabetic peripheral angiopathy without gangrene: Secondary | ICD-10-CM | POA: Diagnosis not present

## 2023-05-16 DIAGNOSIS — I70203 Unspecified atherosclerosis of native arteries of extremities, bilateral legs: Secondary | ICD-10-CM | POA: Diagnosis not present

## 2023-05-16 DIAGNOSIS — E11610 Type 2 diabetes mellitus with diabetic neuropathic arthropathy: Secondary | ICD-10-CM | POA: Diagnosis not present

## 2023-05-16 DIAGNOSIS — E1122 Type 2 diabetes mellitus with diabetic chronic kidney disease: Secondary | ICD-10-CM | POA: Diagnosis not present

## 2023-05-16 DIAGNOSIS — Z4781 Encounter for orthopedic aftercare following surgical amputation: Secondary | ICD-10-CM | POA: Diagnosis not present

## 2023-05-16 DIAGNOSIS — N1831 Chronic kidney disease, stage 3a: Secondary | ICD-10-CM | POA: Diagnosis not present

## 2023-05-16 DIAGNOSIS — Z794 Long term (current) use of insulin: Secondary | ICD-10-CM | POA: Diagnosis not present

## 2023-05-16 DIAGNOSIS — E1140 Type 2 diabetes mellitus with diabetic neuropathy, unspecified: Secondary | ICD-10-CM | POA: Diagnosis not present

## 2023-05-16 DIAGNOSIS — Z89422 Acquired absence of other left toe(s): Secondary | ICD-10-CM | POA: Diagnosis not present

## 2023-05-17 ENCOUNTER — Encounter: Payer: Self-pay | Admitting: Family Medicine

## 2023-05-17 ENCOUNTER — Ambulatory Visit (INDEPENDENT_AMBULATORY_CARE_PROVIDER_SITE_OTHER): Payer: No Typology Code available for payment source | Admitting: Family Medicine

## 2023-05-17 VITALS — BP 136/70 | HR 87 | Temp 97.3°F | Resp 16 | Ht 62.0 in | Wt 204.2 lb

## 2023-05-17 DIAGNOSIS — Z794 Long term (current) use of insulin: Secondary | ICD-10-CM

## 2023-05-17 DIAGNOSIS — Z09 Encounter for follow-up examination after completed treatment for conditions other than malignant neoplasm: Secondary | ICD-10-CM | POA: Diagnosis not present

## 2023-05-17 DIAGNOSIS — I10 Essential (primary) hypertension: Secondary | ICD-10-CM

## 2023-05-17 DIAGNOSIS — E1165 Type 2 diabetes mellitus with hyperglycemia: Secondary | ICD-10-CM

## 2023-05-17 DIAGNOSIS — D649 Anemia, unspecified: Secondary | ICD-10-CM

## 2023-05-17 DIAGNOSIS — M869 Osteomyelitis, unspecified: Secondary | ICD-10-CM | POA: Diagnosis not present

## 2023-05-17 DIAGNOSIS — N1832 Chronic kidney disease, stage 3b: Secondary | ICD-10-CM | POA: Diagnosis not present

## 2023-05-17 DIAGNOSIS — S98132A Complete traumatic amputation of one left lesser toe, initial encounter: Secondary | ICD-10-CM

## 2023-05-17 DIAGNOSIS — Z5181 Encounter for therapeutic drug level monitoring: Secondary | ICD-10-CM

## 2023-05-17 DIAGNOSIS — Z760 Encounter for issue of repeat prescription: Secondary | ICD-10-CM | POA: Diagnosis not present

## 2023-05-17 DIAGNOSIS — K219 Gastro-esophageal reflux disease without esophagitis: Secondary | ICD-10-CM | POA: Diagnosis not present

## 2023-05-17 MED ORDER — AMLODIPINE BESYLATE 5 MG PO TABS
5.0000 mg | ORAL_TABLET | Freq: Every day | ORAL | 0 refills | Status: DC
Start: 2023-05-17 — End: 2023-06-18

## 2023-05-17 MED ORDER — ARIPIPRAZOLE 2 MG PO TABS
2.0000 mg | ORAL_TABLET | Freq: Every evening | ORAL | 0 refills | Status: DC
Start: 2023-05-17 — End: 2023-06-07

## 2023-05-17 MED ORDER — OMEPRAZOLE 40 MG PO CPDR
40.0000 mg | DELAYED_RELEASE_CAPSULE | Freq: Every day | ORAL | 0 refills | Status: DC
Start: 2023-05-17 — End: 2023-06-18

## 2023-05-17 NOTE — Progress Notes (Signed)
Patient ID: Summer [name], female    DOB: 10-Aug-1951, 72 y.o.   MRN: [id number]  PCP: Alba Cory, MD  Chief Complaint  Patient presents with   Hospitalization Follow-up    Toe osteomyelitis, left, has not seen podiatry until next week    Subjective:   Summer [name] is a 72 y.o. female, presents to clinic with CC of the following:  Here for hospital follow up/transition of care.  Admit date: 05/01/2023 Discharge date: 05/04/2023 Transition of care was initiated previously by rose amanda on 05/07/2023 and med changes, diagnosis, specialist follow ups and pts symptoms and condition were all reviewed.  Pt was admitted for osteomyelitis left foot/toes with left foot partial amputation New medications started per hospitalization - abx Pt feels good, no pain or issues Pt had 1 week podiatry f/up and did not show up to appt - she was not aware it had been scheduled for her. Now scheduled on 9/11 for f/up  Instructions on discharge paperwork said to f/up with surgeon in 1 week and keep dressing on until then. She hasn't removed dressing, hx of neuropathy, but reports no pain and drainage through bandage, she has staples and sutures.   Admit date:     05/01/2023  Discharge date: 05/04/23  Discharge Physician: Enedina Finner    PCP: Alba Cory, MD    Recommendations at discharge:    F/u Dr Lilian Kapur Podiatry in 1 week F/u PCP in 1-2 weeks Keep dressing on till seen by Podiatry Keep log of sugars at hime   Discharge Diagnoses: Principal Problem:   Toe osteomyelitis, left ([name]) Active Problems:   Uncontrolled type 2 diabetes mellitus with hyperglycemia, with long-term current use of insulin ([name])   Adult hypothyroidism   Charcot foot due to diabetes mellitus ([name])   MDD (major depressive disorder), recurrent episode, moderate ([name])   Diabetes mellitus ([name])   PAD (peripheral artery disease) ([name])   CKD stage 3a, GFR 45-59 ml/min ([name])   Warmth of joint     Summer [name] is a  71 year old female with history of depression, anxiety, hypertension, insulin-dependent diabetes mellitus type 2, hypothyroid, hyperlipidemia, neuropathy, history of diabetic foot wound, who presents to the emergency department from podiatry clinic for chief concerns of osteomyelitis of the left first and second toes.    Diabetic foot infection/osteomyelitis -- TFA podiatry Dr. Lilian Kapur to see patient -- MRI left foot positive for First and second toe ulcerations with underlying osteomyelitis of the first and second distal phalanges. No abscess -- continue broad-spectrum antibiotic. Patient is NPO. Podiatry to discuss final plans with patient. --8/22--pt is s/p LEFT FOOT PARTIAL AMPUTATION 1st & 2nd TOE . --ok to change to po augmentin + doxycycline and PT with WBAT per Dr Lilian Kapur --HHPT  --ok to d/c per Podiatry    CKD stage 3a, GFR 45-59 ml/min ([name]) --At baseline   Diabetes mellitus ([name]), type II, uncontrolled, peripheral neuropathy --Insulin SSI with at bedtime coverage ordered --resumed home meds -- continue Lyrica   MDD (major depressive disorder), recurrent episode, moderate ([name]) --cont Abilify, sertraline, trazodone     hypothyroidism --Levothyroxine    D/c today. Pt agreeable   Pt was to be on augmentin and doxy x 13 d, should be completing today - pt has taken meds Last hospital labs showed H/H 10.5/31.6 - hx of anemia - stable per baseline eGFR 56, hx of CKD stage 3 Lab Results  Component Value Date   HGBA1C 7.0 (H) 01/15/2023  IDDM  managed by endocrinology on tresiba last rx says 60 units, she reports taking 28-30 units some days, morning CBGs 160 and goes up from there with eating She estimates giving insulin 5 out of the last 7 days  Last routine f/up with PCP was in July with recommended 1 month f/up on several med changes and chronic conditions, she came back in Aug with acute CC diabetic ulcer, saw podiatry same day and then sent to the hospital and  admitted She is overdue for the routine pcp f/up on several meds/dx   Patient Active Problem List   Diagnosis Date Noted   Toe osteomyelitis, left ([name]) 05/01/2023   CKD stage 3a, GFR 45-59 ml/min ([name]) 05/01/2023   Warmth of joint 05/01/2023   Small vessel disease ([name]) 03/27/2023   Coronary artery disease involving native coronary artery of native heart without angina pectoris 03/16/2023   First degree AV block 03/16/2023   CKD (chronic kidney disease) stage 4, GFR 15-29 ml/min ([name]) 03/16/2023   Posterior reversible encephalopathy syndrome 08/21/2022   Seizure ([name]) 08/20/2022   Multiple falls 08/15/2022   Gouty arthritis 08/15/2022   Diabetic ulcer of left midfoot associated with diabetes mellitus due to underlying condition, limited to breakdown of skin ([name]) 08/15/2022   Bacteremia due to Streptococcus pneumoniae 08/12/2022   Metabolic acidosis 08/11/2022   Ulcer of left foot due to type 2 diabetes mellitus ([name]) 08/10/2022   Major depression in remission ([name]) 07/04/2022   Atherosclerosis of aorta ([name]) 07/04/2022   Insomnia due to other mental disorder 07/04/2022   Amputated toe of left foot ([name]) 07/04/2022   Chronic obstructive pulmonary disease ([name]) 01/28/2021   PAD (peripheral artery disease) ([name]) 12/03/2020   Diabetes mellitus ([name]) 05/24/2020   B12 deficiency 05/24/2020   Vitamin D deficiency 05/24/2020   Metatarsalgia of left foot 01/15/2020   MDD (major depressive disorder), recurrent episode, moderate ([name]) 04/04/2019   Left arm weakness 04/03/2019   (HFpEF) heart failure with preserved ejection fraction ([name]) 08/31/2017   Moderate persistent asthma 08/31/2017   Hyperlipidemia associated with type 2 diabetes mellitus ([name]) 06/14/2017   Uncontrolled type 2 diabetes mellitus with hyperglycemia, with long-term current use of insulin ([name]) 08/18/2016   Charcot foot due to diabetes mellitus ([name]) 02/22/2016   Acquired abduction deformity of foot 07/12/2015    Osteoarthritis of subtalar joint 07/12/2015   Arthritis of foot, degenerative 07/12/2015   Carpal tunnel syndrome 04/17/2015   Chronic constipation 04/17/2015   Insomnia, persistent 04/17/2015   Stage 3b chronic kidney disease ([name]) 04/17/2015   Decreased exercise tolerance 04/17/2015   Diabetes mellitus with polyneuropathy ([name]) 04/17/2015   Gastroesophageal reflux disease without esophagitis 04/17/2015   Bursitis, trochanteric 04/17/2015   Cephalalgia 04/17/2015   Essential hypertension 04/17/2015   Adult hypothyroidism 04/17/2015   Hearing loss 04/17/2015   Chronic recurrent major depressive disorder ([name]) 04/17/2015   Neurogenic claudication 04/17/2015   Morbid obesity with BMI of 40.0-44.9, adult ([name]) 04/17/2015   Hypo-ovarianism 04/17/2015   Perennial allergic rhinitis with seasonal variation 04/17/2015   Acne erythematosa 04/17/2015   Dyskinesia, tardive 04/17/2015   Memory loss 04/17/2015   Impingement syndrome of shoulder 04/17/2015   Dermatitis, stasis 04/17/2015   Obstructive sleep apnea 05/14/2014   Shortness of breath on exertion 05/06/2014   Mixed hyperlipidemia 02/06/2012   LBP (low back pain) 09/16/2008      Current Outpatient Medications:    acetaminophen (TYLENOL 8 HOUR) 650 MG CR tablet, Take 1 tablet (650 mg total) by mouth every 8 (  eight) hours as needed for pain., Disp: 90 tablet, Rfl: 2   amLODipine (NORVASC) 5 MG tablet, Take 1 tablet (5 mg total) by mouth daily., Disp: 90 tablet, Rfl: 0   amoxicillin-clavulanate (AUGMENTIN) 875-125 MG tablet, Take 1 tablet by mouth every 12 (twelve) hours for 13 days., Disp: 26 tablet, Rfl: 0   ARIPiprazole (ABILIFY) 2 MG tablet, Take 1 tablet by mouth every evening., Disp: 30 tablet, Rfl: 0   ASPIRIN LOW DOSE 81 MG EC tablet, TAKE 1 TABLET (81 MG TOTAL) BY MOUTH DAILY., Disp: 30 tablet, Rfl: 0   B-D ULTRAFINE III SHORT PEN 31G X 8 MM MISC, , Disp: , Rfl:    buPROPion (WELLBUTRIN XL) 150 MG 24 hr tablet, Take 150 mg by  mouth daily., Disp: , Rfl:    Cholecalciferol (VITAMIN D3) 25 MCG (1000 UT) CAPS, Take 1,000 Units by mouth daily., Disp: , Rfl:    Continuous Blood Gluc Sensor (FREESTYLE LIBRE 2 SENSOR) MISC, FOR GLUCOSE MONITORING, Disp: , Rfl:    cyanocobalamin (VITAMIN B12) 500 MCG tablet, Take 500 mcg by mouth daily., Disp: , Rfl:    diclofenac Sodium (VOLTAREN) 1 % GEL, Apply 2 g topically 2 (two) times daily., Disp: , Rfl:    doxycycline (VIBRA-TABS) 100 MG tablet, Take 1 tablet (100 mg total) by mouth every 12 (twelve) hours for 13 days., Disp: 26 tablet, Rfl: 0   ezetimibe (ZETIA) 10 MG tablet, Take 1 tablet (10 mg total) by mouth daily., Disp: 90 tablet, Rfl: 3   feeding supplement (ENSURE ENLIVE / ENSURE PLUS) LIQD, Take 237 mLs by mouth 3 (three) times daily., Disp: 237 mL, Rfl: 12   fluticasone furoate-vilanterol (BREO ELLIPTA) 200-25 MCG/ACT AEPB, Inhale 1 puff into the lungs daily., Disp: 1 each, Rfl: 0   furosemide (LASIX) 20 MG tablet, Take 1 tablet (20 mg total) by mouth daily., Disp: 90 tablet, Rfl: 1   irbesartan-hydrochlorothiazide (AVALIDE) 300-12.5 MG tablet, Take 1 tablet by mouth daily., Disp: , Rfl:    levothyroxine (SYNTHROID) 25 MCG tablet, Take 1 tablet (25 mcg total) by mouth daily before breakfast. Two on Sunday, Disp: 100 tablet, Rfl: 1   metFORMIN (GLUCOPHAGE) 500 MG tablet, Take 2 tablets by mouth daily with breakfast., Disp: 90 tablet, Rfl: 0   metoprolol tartrate (LOPRESSOR) 50 MG tablet, Take 0.5 tablets (25 mg total) by mouth 2 (two) times daily., Disp: 90 tablet, Rfl: 0   Misc Natural Products (OSTEO BI-FLEX ADV JOINT SHIELD PO), Take by mouth 2 (two) times daily., Disp: , Rfl:    montelukast (SINGULAIR) 10 MG tablet, Take 1 tablet (10 mg total) by mouth daily., Disp: 90 tablet, Rfl: 0   omeprazole (PRILOSEC) 40 MG capsule, Take 1 capsule (40 mg total) by mouth daily., Disp: 90 capsule, Rfl: 0   pregabalin (LYRICA) 150 MG capsule, Take 1 capsule (150 mg total) by mouth 2 (two)  times daily for 10 days., Disp: 20 capsule, Rfl: 0   Probiotic Product (PROBIOTIC ADVANCED PO), Take 1 tablet by mouth daily., Disp: , Rfl:    rosuvastatin (CRESTOR) 40 MG tablet, Take 1 tablet (40 mg total) by mouth daily., Disp: 90 tablet, Rfl: 1   sertraline (ZOLOFT) 100 MG tablet, Take 1.5 tablets (150 mg total) by mouth daily., Disp: 135 tablet, Rfl: 1   traZODone (DESYREL) 100 MG tablet, Take 1 tablet (100 mg total) by mouth at bedtime as needed for sleep., Disp: 90 tablet, Rfl: 1   TRESIBA FLEXTOUCH 200 UNIT/ML FlexTouch Pen, SMARTSIG:60  Unit(s) SUB-Q Daily, Disp: , Rfl:    umeclidinium bromide (INCRUSE ELLIPTA) 62.5 MCG/ACT AEPB, Inhale 1 puff into the lungs daily., Disp: 1 each, Rfl: 0   VASCEPA 1 g capsule, Take 2 capsules (2 g total) by mouth 2 (two) times daily., Disp: 120 capsule, Rfl: 5   Allergies  Allergen Reactions   Codeine Other (See Comments)    "TRIPPED OUT"  DIDN'T LIKE THE WAY IT FELT   Atorvastatin     muscle pain   Hydrocodone     itching   Tramadol    Latex Rash   Zolpidem Other (See Comments)    Sleep walk     Social History   Tobacco Use   Smoking status: Never   Smokeless tobacco: Never  Vaping Use   Vaping status: Never Used  Substance Use Topics   Alcohol use: No    Alcohol/week: 0.0 standard drinks of alcohol   Drug use: No      Chart Review Today: I personally reviewed active problem list, medication list, allergies, family history, social history, health maintenance, notes from last encounter, lab results, imaging with the patient/caregiver today.   Review of Systems  Constitutional: Negative.   HENT: Negative.    Eyes: Negative.   Respiratory: Negative.    Cardiovascular: Negative.   Gastrointestinal: Negative.   Endocrine: Negative.   Genitourinary: Negative.   Musculoskeletal: Negative.   Skin: Negative.   Allergic/Immunologic: Negative.   Neurological: Negative.   Hematological: Negative.   Psychiatric/Behavioral:  Negative.    All other systems reviewed and are negative.      Objective:   Vitals:   05/17/23 1058  BP: 136/70  Pulse: 87  Resp: 16  Temp: (!) 97.3 F (36.3 C)  TempSrc: Temporal  SpO2: 99%  Weight: 204 lb 3.2 oz (92.6 kg)  Height: 5\' 2"  (1.575 m)    Body mass index is 37.68 kg/m.  Physical Exam Vitals and nursing note reviewed.  Constitutional:      General: She is not in acute distress.    Appearance: She is well-developed. She is obese. She is not ill-appearing, toxic-appearing or diaphoretic.  HENT:     Head: Normocephalic and atraumatic.     Nose: Nose normal.  Eyes:     General:        Right eye: No discharge.        Left eye: No discharge.     Conjunctiva/sclera: Conjunctivae normal.  Neck:     Trachea: No tracheal deviation.  Cardiovascular:     Rate and Rhythm: Normal rate and regular rhythm.  Pulmonary:     Effort: Pulmonary effort is normal. No respiratory distress.     Breath sounds: Normal breath sounds. No stridor.  Skin:    General: Skin is warm and dry.     Coloration: Skin is pale.     Findings: No rash.     Comments: Left foot 1st and 2nd toe amputation incision intact, dry with sutures and staples, crusting/scabbing around both w/o drainage, erythema   Neurological:     Mental Status: She is alert.     Motor: No abnormal muscle tone.     Coordination: Coordination normal.  Psychiatric:        Behavior: Behavior normal.         Results for orders placed or performed during the hospital encounter of 05/01/23  Blood culture (routine x 2)   Specimen: BLOOD  Result Value Ref Range   Specimen Description BLOOD  BLOOD RIGHT ARM    Special Requests      BOTTLES DRAWN AEROBIC AND ANAEROBIC Blood Culture adequate volume   Culture      NO GROWTH 5 DAYS Performed at [facility], [address] [place], [place] [address]    Report Status 05/06/2023 FINAL   Blood culture (routine x 2)   Specimen: BLOOD  Result Value Ref Range    Specimen Description BLOOD BLOOD LEFT ARM    Special Requests      BOTTLES DRAWN AEROBIC AND ANAEROBIC Blood Culture adequate volume   Culture      NO GROWTH 5 DAYS Performed at [facility], [address], [place], [place] [address]    Report Status 05/07/2023 FINAL   Surgical PCR screen   Specimen: Nasal Mucosa; Nasal Swab  Result Value Ref Range   MRSA, PCR NEGATIVE NEGATIVE   Staphylococcus aureus POSITIVE (A) NEGATIVE  Aerobic/Anaerobic Culture w Gram Stain (surgical/deep wound)   Specimen: PATH Digit amputation; Tissue  Result Value Ref Range   Specimen Description      TISSUE Performed at [facility], [address], [place], [place] [address]    Special Requests      NONE Performed at [facility], [address] [place], [place] [address]    Gram Stain      NO WBC SEEN RARE GRAM POSITIVE COCCI RARE GRAM POSITIVE RODS    Culture      MODERATE PROTEUS MIRABILIS FEW STAPHYLOCOCCUS AUREUS FEW ENTEROCOCCUS FAECALIS FEW PREVOTELLA SPECIES BETA LACTAMASE POSITIVE Performed at [facility], [address], [place], [place] [address]    Report Status 05/06/2023 FINAL    Organism ID, Bacteria PROTEUS MIRABILIS    Organism ID, Bacteria ENTEROCOCCUS FAECALIS    Organism ID, Bacteria STAPHYLOCOCCUS AUREUS       Susceptibility   Enterococcus faecalis - MIC*    AMPICILLIN <=2 SENSITIVE Sensitive     VANCOMYCIN 1 SENSITIVE Sensitive     GENTAMICIN SYNERGY SENSITIVE Sensitive     * FEW ENTEROCOCCUS FAECALIS   Proteus mirabilis - MIC*    AMPICILLIN >=32 RESISTANT Resistant     CEFEPIME 0.25 SENSITIVE Sensitive     CEFTAZIDIME <=1 SENSITIVE Sensitive     CEFTRIAXONE <=0.25 SENSITIVE Sensitive     CIPROFLOXACIN <=0.25 SENSITIVE Sensitive     GENTAMICIN <=1 SENSITIVE Sensitive     IMIPENEM 2 SENSITIVE Sensitive     TRIMETH/SULFA >=320 RESISTANT Resistant     AMPICILLIN/SULBACTAM 16 INTERMEDIATE Intermediate     PIP/TAZO <=4 SENSITIVE  Sensitive     * MODERATE PROTEUS MIRABILIS   Staphylococcus aureus - MIC*    CIPROFLOXACIN <=0.5 SENSITIVE Sensitive     ERYTHROMYCIN 4 INTERMEDIATE Intermediate     GENTAMICIN <=0.5 SENSITIVE Sensitive     OXACILLIN 0.5 SENSITIVE Sensitive     TETRACYCLINE <=1 SENSITIVE Sensitive     VANCOMYCIN <=0.5 SENSITIVE Sensitive     TRIMETH/SULFA <=10 SENSITIVE Sensitive     CLINDAMYCIN <=0.25 SENSITIVE Sensitive     RIFAMPIN <=0.5 SENSITIVE Sensitive     Inducible Clindamycin NEGATIVE Sensitive     LINEZOLID 2 SENSITIVE Sensitive     * FEW STAPHYLOCOCCUS AUREUS  CBC  Result Value Ref Range   WBC 10.0 4.0 - 10.5 K/uL   RBC 3.77 (L) 3.87 - 5.11 MIL/uL   Hemoglobin 10.9 (L) 12.0 - 15.0 g/dL   HCT 44.0 (L) 34.7 - 42.5 %   MCV 86.2 80.0 -  100.0 fL   MCH 28.9 26.0 - 34.0 pg   MCHC 33.5 30.0 - 36.0 g/dL   RDW 95.6 (H) 21.3 - 08.6 %   Platelets 260 150 - 400 K/uL   nRBC 0.0 0.0 - 0.2 %  Basic metabolic panel  Result Value Ref Range   Sodium 138 135 - 145 mmol/L   Potassium 4.3 3.5 - 5.1 mmol/L   Chloride 106 98 - 111 mmol/L   CO2 21 (L) 22 - 32 mmol/L   Glucose, Bld 205 (H) 70 - 99 mg/dL   BUN 25 (H) 8 - 23 mg/dL   Creatinine, Ser 5.78 (H) 0.44 - 1.00 mg/dL   Calcium 46.9 (H) 8.9 - 10.3 mg/dL   GFR, Estimated 51 (L) >60 mL/min   Anion gap 11 5 - 15  Basic metabolic panel  Result Value Ref Range   Sodium 138 135 - 145 mmol/L   Potassium 4.0 3.5 - 5.1 mmol/L   Chloride 108 98 - 111 mmol/L   CO2 21 (L) 22 - 32 mmol/L   Glucose, Bld 178 (H) 70 - 99 mg/dL   BUN 27 (H) 8 - 23 mg/dL   Creatinine, Ser 6.29 (H) 0.44 - 1.00 mg/dL   Calcium 52.8 8.9 - 41.3 mg/dL   GFR, Estimated 48 (L) >60 mL/min   Anion gap 9 5 - 15  CBC  Result Value Ref Range   WBC 10.0 4.0 - 10.5 K/uL   RBC 3.65 (L) 3.87 - 5.11 MIL/uL   Hemoglobin 10.5 (L) 12.0 - 15.0 g/dL   HCT 24.4 (L) 01.0 - 27.2 %   MCV 86.6 80.0 - 100.0 fL   MCH 28.8 26.0 - 34.0 pg   MCHC 33.2 30.0 - 36.0 g/dL   RDW 53.6 (H) 64.4 - 03.4 %    Platelets 238 150 - 400 K/uL   nRBC 0.0 0.0 - 0.2 %  Glucose, capillary  Result Value Ref Range   Glucose-Capillary 168 (H) 70 - 99 mg/dL  Glucose, capillary  Result Value Ref Range   Glucose-Capillary 194 (H) 70 - 99 mg/dL  Glucose, capillary  Result Value Ref Range   Glucose-Capillary 173 (H) 70 - 99 mg/dL  Creatinine, serum  Result Value Ref Range   Creatinine, Ser 1.04 (H) 0.44 - 1.00 mg/dL   GFR, Estimated 56 (L) >60 mL/min  Glucose, capillary  Result Value Ref Range   Glucose-Capillary 180 (H) 70 - 99 mg/dL  Glucose, capillary  Result Value Ref Range   Glucose-Capillary 159 (H) 70 - 99 mg/dL  Glucose, capillary  Result Value Ref Range   Glucose-Capillary 204 (H) 70 - 99 mg/dL  Glucose, capillary  Result Value Ref Range   Glucose-Capillary 250 (H) 70 - 99 mg/dL  Glucose, capillary  Result Value Ref Range   Glucose-Capillary 170 (H) 70 - 99 mg/dL  Glucose, capillary  Result Value Ref Range   Glucose-Capillary 196 (H) 70 - 99 mg/dL   Comment 1 Notify RN   Glucose, capillary  Result Value Ref Range   Glucose-Capillary 191 (H) 70 - 99 mg/dL       Assessment & Plan:     ICD-10-CM   1. Encounter for examination following treatment at hospital  Z09 CBC with Differential/Platelet    BASIC METABOLIC PANEL WITH GFR    Hemoglobin A1c   extensive record review done today    2. Amputated toe of left foot ([name])  V42.595G    pt has taken abx, unfortunately did not  know about surgeon/podiatry f/up appt and is scheduled instead next week    3. Uncontrolled type 2 diabetes mellitus with hyperglycemia, with long-term current use of insulin ([name])  E11.65 BASIC METABOLIC PANEL WITH GFR   Z79.4 Hemoglobin A1c   recheck A1C, pt is doign basal insulin some days - doing 28-30 units estimated 5 d a week    4. Anemia, unspecified type  D64.9 CBC with Differential/Platelet   appears chronic, in hospital labs are near baseline, will recheck    5. Stage 3b chronic kidney disease  ([name])  N18.32     6. Encounter for medication monitoring  Z51.81 CBC with Differential/Platelet    BASIC METABOLIC PANEL WITH GFR    Hemoglobin A1c    7. Benign hypertension  I10 amLODipine (NORVASC) 5 MG tablet   Bp near goal today - refilled meds as is, pt should f/up pcp for planned on HTN recheck and plan to adjust meds (from july visit)    8. Gastroesophageal reflux disease without esophagitis  K21.9 omeprazole (PRILOSEC) 40 MG capsule   meds refilled    9. Medication refill  Z76.0 ARIPiprazole (ABILIFY) 2 MG tablet    amLODipine (NORVASC) 5 MG tablet    omeprazole (PRILOSEC) 40 MG capsule   sent in unchanged med refills x 1 month on what she appears she'll be out of, she needs pcp f/up in the next month     Bandage taken off, incisions inspected and pt rebandaged with non-adherent gauze, kerlex, and applied her ace wrap again and placed back in boot.   F/up podiatry  Return for f/up PCP routine/med refill in 1 month .     Danelle Berry, PA-C 05/17/23 11:06 AM

## 2023-05-18 DIAGNOSIS — Z794 Long term (current) use of insulin: Secondary | ICD-10-CM | POA: Diagnosis not present

## 2023-05-18 DIAGNOSIS — F419 Anxiety disorder, unspecified: Secondary | ICD-10-CM | POA: Diagnosis not present

## 2023-05-18 DIAGNOSIS — E1151 Type 2 diabetes mellitus with diabetic peripheral angiopathy without gangrene: Secondary | ICD-10-CM | POA: Diagnosis not present

## 2023-05-18 DIAGNOSIS — I70203 Unspecified atherosclerosis of native arteries of extremities, bilateral legs: Secondary | ICD-10-CM | POA: Diagnosis not present

## 2023-05-18 DIAGNOSIS — Z7984 Long term (current) use of oral hypoglycemic drugs: Secondary | ICD-10-CM | POA: Diagnosis not present

## 2023-05-18 DIAGNOSIS — N1831 Chronic kidney disease, stage 3a: Secondary | ICD-10-CM | POA: Diagnosis not present

## 2023-05-18 DIAGNOSIS — Z89422 Acquired absence of other left toe(s): Secondary | ICD-10-CM | POA: Diagnosis not present

## 2023-05-18 DIAGNOSIS — E1122 Type 2 diabetes mellitus with diabetic chronic kidney disease: Secondary | ICD-10-CM | POA: Diagnosis not present

## 2023-05-18 DIAGNOSIS — F329 Major depressive disorder, single episode, unspecified: Secondary | ICD-10-CM | POA: Diagnosis not present

## 2023-05-18 DIAGNOSIS — Z4781 Encounter for orthopedic aftercare following surgical amputation: Secondary | ICD-10-CM | POA: Diagnosis not present

## 2023-05-18 DIAGNOSIS — E11610 Type 2 diabetes mellitus with diabetic neuropathic arthropathy: Secondary | ICD-10-CM | POA: Diagnosis not present

## 2023-05-18 DIAGNOSIS — E1140 Type 2 diabetes mellitus with diabetic neuropathy, unspecified: Secondary | ICD-10-CM | POA: Diagnosis not present

## 2023-05-18 LAB — CBC WITH DIFFERENTIAL/PLATELET
Absolute Monocytes: 949 {cells}/uL (ref 200–950)
Basophils Absolute: 124 {cells}/uL (ref 0–200)
Basophils Relative: 1.1 %
Eosinophils Absolute: 396 {cells}/uL (ref 15–500)
Eosinophils Relative: 3.5 %
HCT: 37.8 % (ref 35.0–45.0)
Hemoglobin: 12.3 g/dL (ref 11.7–15.5)
Lymphs Abs: 2610 {cells}/uL (ref 850–3900)
MCH: 28.6 pg (ref 27.0–33.0)
MCHC: 32.5 g/dL (ref 32.0–36.0)
MCV: 87.9 fL (ref 80.0–100.0)
MPV: 11.2 fL (ref 7.5–12.5)
Monocytes Relative: 8.4 %
Neutro Abs: 7221 {cells}/uL (ref 1500–7800)
Neutrophils Relative %: 63.9 %
Platelets: 288 10*3/uL (ref 140–400)
RBC: 4.3 Million/uL (ref 3.80–5.10)
RDW: 15.7 % — ABNORMAL HIGH (ref 11.0–15.0)
Total Lymphocyte: 23.1 %
WBC: 11.3 10*3/uL — ABNORMAL HIGH (ref 3.8–10.8)

## 2023-05-18 LAB — BASIC METABOLIC PANEL WITHOUT GFR
BUN/Creatinine Ratio: 29 (calc) — ABNORMAL HIGH (ref 6–22)
BUN: 38 mg/dL — ABNORMAL HIGH (ref 7–25)
CO2: 22 mmol/L (ref 20–32)
Calcium: 10.6 mg/dL — ABNORMAL HIGH (ref 8.6–10.4)
Chloride: 106 mmol/L (ref 98–110)
Creat: 1.31 mg/dL — ABNORMAL HIGH (ref 0.60–1.00)
Glucose, Bld: 126 mg/dL — ABNORMAL HIGH (ref 65–99)
Potassium: 5.1 mmol/L (ref 3.5–5.3)
Sodium: 140 mmol/L (ref 135–146)
eGFR: 43 mL/min/{1.73_m2} — ABNORMAL LOW

## 2023-05-18 LAB — HEMOGLOBIN A1C
Hgb A1c MFr Bld: 7.7 %{Hb} — ABNORMAL HIGH
Mean Plasma Glucose: 174 mg/dL
eAG (mmol/L): 9.7 mmol/L

## 2023-05-22 DIAGNOSIS — J449 Chronic obstructive pulmonary disease, unspecified: Secondary | ICD-10-CM | POA: Diagnosis not present

## 2023-05-23 ENCOUNTER — Ambulatory Visit (INDEPENDENT_AMBULATORY_CARE_PROVIDER_SITE_OTHER): Payer: No Typology Code available for payment source | Admitting: Podiatry

## 2023-05-23 ENCOUNTER — Encounter: Payer: Self-pay | Admitting: Podiatry

## 2023-05-23 ENCOUNTER — Telehealth: Payer: Self-pay

## 2023-05-23 DIAGNOSIS — Z89422 Acquired absence of other left toe(s): Secondary | ICD-10-CM

## 2023-05-23 DIAGNOSIS — M79675 Pain in left toe(s): Secondary | ICD-10-CM | POA: Diagnosis not present

## 2023-05-23 DIAGNOSIS — B351 Tinea unguium: Secondary | ICD-10-CM | POA: Diagnosis not present

## 2023-05-23 DIAGNOSIS — J449 Chronic obstructive pulmonary disease, unspecified: Secondary | ICD-10-CM

## 2023-05-23 DIAGNOSIS — M79674 Pain in right toe(s): Secondary | ICD-10-CM | POA: Diagnosis not present

## 2023-05-23 NOTE — Telephone Encounter (Signed)
ONO reviewed by Dr. Belia Heman-- recommend 1L at bedtime Lowest spo2 76%.  Patient is aware of results and voiced her understanding.  Order placed to adapt for O2, as patient agreed with plan. Nothing further needed.

## 2023-05-24 DIAGNOSIS — R69 Illness, unspecified: Secondary | ICD-10-CM | POA: Diagnosis not present

## 2023-05-27 NOTE — Progress Notes (Signed)
Subjective:  Patient ID: Summer [name], female    DOB: 14-Feb-1951,  MRN: [id number]  Chief Complaint  Patient presents with   Routine Post Op    POV # 1 DOS 05/02/2023 LEFT FOOT PARTIAL AMPUTATION 1st & 2nd TOE "Pretty good, I have had any pain or bleeding."   Diabetes    "I need my nails trimmed on my right foot."    72 y.o. female returns for post-op check.   Review of Systems: Negative except as noted in the HPI. Denies N/V/F/Ch.   Objective:  There were no vitals filed for this visit. There is no height or weight on file to calculate BMI. Constitutional Well developed. Well nourished.  Vascular Foot warm and well perfused. Capillary refill normal to all digits.  Calf is soft and supple, no posterior calf or knee pain, negative Homans' sign  Neurologic Normal speech. Oriented to person, place, and time. Epicritic sensation to light touch grossly reduced bilaterally.  Dermatologic Skin healing well without signs of infection. Skin edges well coapted without signs of infection.  Thickened elongated nails x 8  Orthopedic: Tenderness to palpation noted about the surgical site.    Assessment:   1. Pain due to onychomycosis of toenails of both feet   2. History of amputation of lesser toe of left foot ([name])    Plan:  Patient was evaluated and treated and all questions answered.  S/p foot surgery left -Progressing as expected post-operatively.  Sutures removed.  May resume regular shoe gear and bathing.  Nails are debrided today in length and thickness to alleviate pain and reduce fungal load.  Return in 6 weeks for final follow-up and resume at risk diabetic footcare.   Return in about 6 weeks (around 07/04/2023) for f/u from surgery, [facility].

## 2023-05-30 ENCOUNTER — Telehealth: Payer: Self-pay | Admitting: *Deleted

## 2023-05-30 DIAGNOSIS — I152 Hypertension secondary to endocrine disorders: Secondary | ICD-10-CM | POA: Diagnosis not present

## 2023-05-30 DIAGNOSIS — E785 Hyperlipidemia, unspecified: Secondary | ICD-10-CM | POA: Diagnosis not present

## 2023-05-30 DIAGNOSIS — E039 Hypothyroidism, unspecified: Secondary | ICD-10-CM | POA: Diagnosis not present

## 2023-05-30 DIAGNOSIS — E1142 Type 2 diabetes mellitus with diabetic polyneuropathy: Secondary | ICD-10-CM | POA: Diagnosis not present

## 2023-05-30 DIAGNOSIS — Z794 Long term (current) use of insulin: Secondary | ICD-10-CM | POA: Diagnosis not present

## 2023-05-30 DIAGNOSIS — E1159 Type 2 diabetes mellitus with other circulatory complications: Secondary | ICD-10-CM | POA: Diagnosis not present

## 2023-05-30 DIAGNOSIS — E1169 Type 2 diabetes mellitus with other specified complication: Secondary | ICD-10-CM | POA: Diagnosis not present

## 2023-05-30 NOTE — Telephone Encounter (Signed)
Irving Burton is calling regarding medication discrepancies on hospital discharge: Patient is missing: VASCEPA 1 g capsule                              rosuvastatin (CRESTOR) 40 MG tablet                               levothyroxine (SYNTHROID) 25 MCG tablet                              irbesartan-hydrochlorothiazide (AVALIDE) 300-12.5 MG tablet                              montelukast (SINGULAIR) 10 MG tablet  Dose discrepancy :  sertraline (ZOLOFT) 100 MG tablet - discharge states 1 daily and patient Rx is 1.5 daily                                  traZODone (DESYREL) 100 MG tablet - discharge states 50 mg- 1.5 (75 mg)                                                                                            not  100 mg                                TRESIBA FLEXTOUCH 200 UNIT/ML FlexTouch Pen -patient has increased to 60 units  Patient can not afford the inhalers prescribed by hospital- and does not have Rx for Breztrii she was taking before- she is not sure why it was stopped.   Patient states her medications were not reviewed at her dressing change appointment- could office please review and make sure she has what she needs.

## 2023-06-06 ENCOUNTER — Other Ambulatory Visit: Payer: Self-pay | Admitting: Family Medicine

## 2023-06-06 ENCOUNTER — Other Ambulatory Visit: Payer: Self-pay

## 2023-06-07 ENCOUNTER — Other Ambulatory Visit: Payer: Self-pay | Admitting: Family Medicine

## 2023-06-07 DIAGNOSIS — Z760 Encounter for issue of repeat prescription: Secondary | ICD-10-CM

## 2023-06-07 DIAGNOSIS — R69 Illness, unspecified: Secondary | ICD-10-CM | POA: Diagnosis not present

## 2023-06-15 ENCOUNTER — Other Ambulatory Visit: Payer: No Typology Code available for payment source

## 2023-06-15 NOTE — Progress Notes (Unsigned)
Name: Summer [name]   MRN: [id number]    DOB: 1950-10-23   Date:06/18/2023       Progress Note  Subjective  Chief Complaint  Medication Refill  HPI  Hypothyroidism: she has been off levothyroxine for over one month and TSH done by Dr. Turner Daniels was normal.  He will stay off medication for now  COPD: from second hand smoking, last visit with Dr. Belia Heman was 10/2021 and gave her Feliberto Gottron - she is out of medication due to cost  . She has SOB with activity that has been chronic  She continues to have a daily cough. She also has a history of asthma but has been out of singulair and is doing well. Discussed getting her on assistance program for [facility] but she states it was not helping anyways  Morbid Obesity/Malnutrition : BMI is dropping, weight is 238 lbs but was hospitalized in Dec with sepsis and lost a lot of weight, down to 215.9  lbs, weight is stable, last visit 211 lbs today is 213 lbs   MDD- recurrent and moderate: she is currently only taking sertraline and bupropion , she used to see a psychiatrist but lost to follow up. She is also taking low dose of Abilify and denies side effects, we will not increase dose since in the past she developed smacking of lips  She continues to have lack of energy but mood has improved    Diabetes type II insulin requiring  : she is under the care of Endocrinologist, Dr. Gershon Crane ,lats A1C was 7.16 May 2022 and down to 7 % in May 2024 and it was 7.6 % in September 2024 She has dyslipidemia, obesity, CKI , [employer] food ,  diabetic neuropathy, and HTN associated with DM. She is now on Farxiga from assistance program, Metformin and Tresiba  Charcot foot and history of left 5th toe amputation due to osteomyelitis: under the care of Dr. Logan Bores  CKI stage 3b:  She is on ARB and Farxiga , bp is controlled, under the care of nephrologist - Dr. Cherylann Ratel. She has good urine output and no pruritus. Reviewed medications , she has been compliant with pill pack from [facility]   Heart  failure with preserved ejection fracture : EF 65 % but has mild increase in left ventricular wall thickness . She was recently by Dr. Okey Dupre and lopressor dose was changed to half pill bid, but she is skipping am dose and taking one at night, explained it is not a 24hour medication and must take as directed. She states feeling better since she stopped taking beta blocker in am. She is also on furosemide, SGL-2 agonist and symptoms are stable  She denies orthopnea, she has sob with activity that is likely multifactorial   OSA: she is unable to wear her CPAP, no longer on modafinil and not sure why. Likely due to cost  HTN: BP is still towards the low end of normal, but feeling better with lower dose of metoprolol   Abnormal MRI brain/ Small vessel disease :  discussed results with patient during her visit , continue aggressive control of DM, statin therapy and monitor. She is currently not taking Vascepa but willing to get a refill  Chronic bilateral knee pain: but left knee worse than right and has instability, she uses a walker but states having difficulty walking longer distances and her scooter is very heavy, we will try to order a lighter scooter    Patient Active Problem List   Diagnosis  Date Noted   Toe osteomyelitis, left ([name]) 05/01/2023   CKD stage 3a, GFR 45-59 ml/min ([name]) 05/01/2023   Warmth of joint 05/01/2023   Small vessel disease ([name]) 03/27/2023   Coronary artery disease involving native coronary artery of native heart without angina pectoris 03/16/2023   First degree AV block 03/16/2023   CKD (chronic kidney disease) stage 4, GFR 15-29 ml/min ([name]) 03/16/2023   Posterior reversible encephalopathy syndrome 08/21/2022   Seizure ([name]) 08/20/2022   Multiple falls 08/15/2022   Gouty arthritis 08/15/2022   Diabetic ulcer of left midfoot associated with diabetes mellitus due to underlying condition, limited to breakdown of skin ([name]) 08/15/2022   Bacteremia due to Streptococcus  pneumoniae 08/12/2022   Metabolic acidosis 08/11/2022   Ulcer of left foot due to type 2 diabetes mellitus ([name]) 08/10/2022   Major depression in remission ([name]) 07/04/2022   Atherosclerosis of aorta ([name]) 07/04/2022   Insomnia due to other mental disorder 07/04/2022   Amputated toe of left foot ([name]) 07/04/2022   Chronic obstructive pulmonary disease ([name]) 01/28/2021   PAD (peripheral artery disease) ([name]) 12/03/2020   Diabetes mellitus ([name]) 05/24/2020   B12 deficiency 05/24/2020   Vitamin D deficiency 05/24/2020   Metatarsalgia of left foot 01/15/2020   MDD (major depressive disorder), recurrent episode, moderate ([name]) 04/04/2019   Left arm weakness 04/03/2019   (HFpEF) heart failure with preserved ejection fraction ([name]) 08/31/2017   Moderate persistent asthma 08/31/2017   Hyperlipidemia associated with type 2 diabetes mellitus ([name]) 06/14/2017   Uncontrolled type 2 diabetes mellitus with hyperglycemia, with long-term current use of insulin ([name]) 08/18/2016   Charcot foot due to diabetes mellitus ([name]) 02/22/2016   Acquired abduction deformity of foot 07/12/2015   Osteoarthritis of subtalar joint 07/12/2015   Arthritis of foot, degenerative 07/12/2015   Carpal tunnel syndrome 04/17/2015   Chronic constipation 04/17/2015   Insomnia, persistent 04/17/2015   Stage 3b chronic kidney disease ([name]) 04/17/2015   Decreased exercise tolerance 04/17/2015   Diabetes mellitus with polyneuropathy ([name]) 04/17/2015   Gastroesophageal reflux disease without esophagitis 04/17/2015   Bursitis, trochanteric 04/17/2015   Cephalalgia 04/17/2015   Essential hypertension 04/17/2015   Adult hypothyroidism 04/17/2015   Hearing loss 04/17/2015   Chronic recurrent major depressive disorder ([name]) 04/17/2015   Neurogenic claudication 04/17/2015   Morbid obesity with BMI of 40.0-44.9, adult ([name]) 04/17/2015   Hypo-ovarianism 04/17/2015   Perennial allergic rhinitis with seasonal variation 04/17/2015    Acne erythematosa 04/17/2015   Dyskinesia, tardive 04/17/2015   Memory loss 04/17/2015   Impingement syndrome of shoulder 04/17/2015   Dermatitis, stasis 04/17/2015   Obstructive sleep apnea 05/14/2014   Shortness of breath on exertion 05/06/2014   Mixed hyperlipidemia 02/06/2012   Low back pain 09/16/2008    Past Surgical History:  Procedure Laterality Date   ABDOMINAL HYSTERECTOMY  1975   AMPUTATION TOE Left 05/02/2023   Procedure: LEFT FOOT PARTIAL AMPUTATION 1st & 2nd TOE;  Surgeon: Edwin Cap, DPM;  Location: [facility] ORS;  Service: Orthopedics/Podiatry;  Laterality: Left;   ANKLE SURGERY Left approx Jan 2018   APPENDECTOMY  1970   CATARACT EXTRACTION  01/2011   right   COLONOSCOPY WITH PROPOFOL N/A 08/19/2019   Procedure: COLONOSCOPY WITH PROPOFOL;  Surgeon: Wyline Mood, MD;  Location: [facility] ENDOSCOPY;  Service: Gastroenterology;  Laterality: N/A;   eye lid surgery  2013   bilateral   FOOT SURGERY     LOWER EXTREMITY ANGIOGRAPHY Left 11/23/2020   Procedure: LOWER EXTREMITY ANGIOGRAPHY;  Surgeon: Gilda Crease,  Latina Craver, MD;  Location: [facility] INVASIVE CV LAB;  Service: Cardiovascular;  Laterality: Left;   NECK SURGERY     SPINE SURGERY     TEE WITHOUT CARDIOVERSION N/A 08/16/2022   Procedure: TRANSESOPHAGEAL ECHOCARDIOGRAM (TEE);  Surgeon: Debbe Odea, MD;  Location: [facility] ORS;  Service: Cardiovascular;  Laterality: N/A;   TUBAL LIGATION     VAGINAL HYSTERECTOMY  1989    Family History  Problem Relation Age of Onset   Aneurysm Mother    Aortic aneurysm Mother    Hypertension Mother    Hyperlipidemia Mother    Heart disease Mother    Obesity Mother    Heart attack Maternal Grandfather    Diabetes Maternal Grandfather     Social History   Tobacco Use   Smoking status: Never   Smokeless tobacco: Never  Substance Use Topics   Alcohol use: No    Alcohol/week: 0.0 standard drinks of alcohol     Current Outpatient Medications:    acetaminophen (TYLENOL 8 HOUR) 650  MG CR tablet, Take 1 tablet (650 mg total) by mouth every 8 (eight) hours as needed for pain., Disp: 90 tablet, Rfl: 2   ASPIRIN LOW DOSE 81 MG EC tablet, TAKE 1 TABLET (81 MG TOTAL) BY MOUTH DAILY., Disp: 30 tablet, Rfl: 0   B-D ULTRAFINE III SHORT PEN 31G X 8 MM MISC, , Disp: , Rfl:    Cholecalciferol (VITAMIN D3) 25 MCG (1000 UT) CAPS, Take 1,000 Units by mouth daily., Disp: , Rfl:    Continuous Blood Gluc Sensor (FREESTYLE LIBRE 2 SENSOR) MISC, FOR GLUCOSE MONITORING, Disp: , Rfl:    cyanocobalamin (VITAMIN B12) 500 MCG tablet, Take 500 mcg by mouth daily., Disp: , Rfl:    diclofenac Sodium (VOLTAREN) 1 % GEL, Apply 2 g topically 2 (two) times daily., Disp: , Rfl:    feeding supplement (ENSURE ENLIVE / ENSURE PLUS) LIQD, Take 237 mLs by mouth 3 (three) times daily., Disp: 237 mL, Rfl: 12   metFORMIN (GLUCOPHAGE) 500 MG tablet, Take 2 tablets by mouth daily with breakfast., Disp: 90 tablet, Rfl: 0   Misc Natural Products (OSTEO BI-FLEX ADV JOINT SHIELD PO), Take by mouth 2 (two) times daily., Disp: , Rfl:    Probiotic Product (PROBIOTIC ADVANCED PO), Take 1 tablet by mouth daily., Disp: , Rfl:    TRESIBA FLEXTOUCH 200 UNIT/ML FlexTouch Pen, SMARTSIG:60 Unit(s) SUB-Q Daily, Disp: , Rfl:    amLODipine (NORVASC) 5 MG tablet, Take 1 tablet (5 mg total) by mouth daily., Disp: 30 tablet, Rfl: 5   ARIPiprazole (ABILIFY) 2 MG tablet, Take 1 tablet (2 mg total) by mouth every evening., Disp: 30 tablet, Rfl: 5   buPROPion (WELLBUTRIN XL) 150 MG 24 hr tablet, Take 1 tablet (150 mg total) by mouth daily., Disp: 30 tablet, Rfl: 5   ezetimibe (ZETIA) 10 MG tablet, Take 1 tablet (10 mg total) by mouth daily., Disp: 30 tablet, Rfl: 5   furosemide (LASIX) 20 MG tablet, Take 1 tablet (20 mg total) by mouth daily., Disp: 30 tablet, Rfl: 5   metoprolol tartrate (LOPRESSOR) 50 MG tablet, Take 0.5 tablets (25 mg total) by mouth 2 (two) times daily., Disp: 30 tablet, Rfl: 5   omeprazole (PRILOSEC) 40 MG capsule, Take  1 capsule (40 mg total) by mouth daily., Disp: 30 capsule, Rfl: 5   pregabalin (LYRICA) 150 MG capsule, Take 1 capsule (150 mg total) by mouth 2 (two) times daily., Disp: 60 capsule, Rfl: 0   rosuvastatin (CRESTOR) 40  MG tablet, Take 1 tablet (40 mg total) by mouth daily., Disp: 30 tablet, Rfl: 5   sertraline (ZOLOFT) 100 MG tablet, Take 1.5 tablets (150 mg total) by mouth daily., Disp: 45 tablet, Rfl: 5   traZODone (DESYREL) 100 MG tablet, Take 1 tablet (100 mg total) by mouth at bedtime as needed for sleep., Disp: 30 tablet, Rfl: 5   VASCEPA 1 g capsule, Take 2 capsules (2 g total) by mouth 2 (two) times daily., Disp: 120 capsule, Rfl: 5  Allergies  Allergen Reactions   Codeine Other (See Comments)    "TRIPPED OUT"  DIDN'T LIKE THE WAY IT FELT   Atorvastatin     muscle pain   Hydrocodone     itching   Tramadol    Latex Rash   Zolpidem Other (See Comments)    Sleep walk    I personally reviewed active problem list, medication list, allergies, family history, social history, health maintenance with the patient/caregiver today.   ROS  Ten systems reviewed and is negative except as mentioned in HPI    Objective  Vitals:   06/18/23 1539  BP: 126/82  Pulse: 84  Resp: 16  Weight: 213 lb (96.6 kg)  Height: 5\' 2"  (1.575 m)    Body mass index is 38.96 kg/m.  Physical Exam  Constitutional: Patient appears well-developed and well-nourished. Obese  No distress.  HEENT: head atraumatic, normocephalic, pupils equal and reactive to light, neck supple Cardiovascular: Normal rate, regular rhythm and normal heart sounds.  No murmur heard. No BLE edema. Pulmonary/Chest: Effort normal and breath sounds normal. No respiratory distress. Muscular Skeletal: crepitus and decrease rom of left knee  Abdominal: Soft.  There is no tenderness. Psychiatric: Patient has a normal mood and affect, behavior is normal. Judgment and thought content normal.    PHQ2/9:    06/18/2023    3:39 PM  05/17/2023   10:57 AM 05/01/2023   11:31 AM 01/29/2023    2:23 PM 01/15/2023   11:21 AM  Depression screen PHQ 2/9  Decreased Interest 0 0 0 3 3  Down, Depressed, Hopeless 0 0 0 0 0  PHQ - 2 Score 0 0 0 3 3  Altered sleeping 0 0 0 3 3  Tired, decreased energy 0 0 3 0 0  Change in appetite 0 0 3 0 0  Feeling bad or failure about yourself  0 0 0 0 0  Trouble concentrating 0 0 0 0 0  Moving slowly or fidgety/restless 0 0 0 0 0  Suicidal thoughts 0 0 0 0 0  PHQ-9 Score 0 0 6 6 6   Difficult doing work/chores  Not difficult at all Somewhat difficult Not difficult at all     phq 9 is negative   Fall Risk:    06/18/2023    3:39 PM 05/17/2023   10:56 AM 05/01/2023   11:30 AM 03/27/2023   11:39 AM 01/29/2023    2:23 PM  Fall Risk   Falls in the past year? 1 1 0 0 0  Number falls in past yr: 1 1  0   Injury with Fall? 1 1  0   Risk for fall due to : Impaired balance/gait Impaired balance/gait Impaired balance/gait Impaired balance/gait Impaired balance/gait  Follow up Falls prevention discussed Falls prevention discussed;Education provided;Falls evaluation completed Falls prevention discussed;Education provided;Falls evaluation completed Falls prevention discussed Falls prevention discussed      Functional Status Survey: Is the patient deaf or have difficulty hearing?: No Does the  patient have difficulty seeing, even when wearing glasses/contacts?: No Does the patient have difficulty concentrating, remembering, or making decisions?: No Does the patient have difficulty walking or climbing stairs?: Yes Does the patient have difficulty dressing or bathing?: No Does the patient have difficulty doing errands alone such as visiting a doctor's office or shopping?: Yes    Assessment & Plan  1. Charcot foot due to diabetes mellitus ([name])  - pregabalin (LYRICA) 150 MG capsule; Take 1 capsule (150 mg total) by mouth 2 (two) times daily.  Dispense: 60 capsule; Refill: 0 - For home use only DME  Other see comment  2. Stage 3b chronic kidney disease ([name])  - furosemide (LASIX) 20 MG tablet; Take 1 tablet (20 mg total) by mouth daily.  Dispense: 30 tablet; Refill: 5  3. Chronic heart failure with preserved ejection fraction ([name])  - metoprolol tartrate (LOPRESSOR) 50 MG tablet; Take 0.5 tablets (25 mg total) by mouth 2 (two) times daily.  Dispense: 30 tablet; Refill: 5  4. Dyslipidemia associated with type 2 diabetes mellitus ([name])  - ezetimibe (ZETIA) 10 MG tablet; Take 1 tablet (10 mg total) by mouth daily.  Dispense: 30 tablet; Refill: 5 - rosuvastatin (CRESTOR) 40 MG tablet; Take 1 tablet (40 mg total) by mouth daily.  Dispense: 30 tablet; Refill: 5 - VASCEPA 1 g capsule; Take 2 capsules (2 g total) by mouth 2 (two) times daily.  Dispense: 120 capsule; Refill: 5  5. Insomnia due to other mental disorder  - traZODone (DESYREL) 100 MG tablet; Take 1 tablet (100 mg total) by mouth at bedtime as needed for sleep.  Dispense: 30 tablet; Refill: 5  6. Benign hypertension  - amLODipine (NORVASC) 5 MG tablet; Take 1 tablet (5 mg total) by mouth daily.  Dispense: 30 tablet; Refill: 5  7. PAD (peripheral artery disease) ([name])  - VASCEPA 1 g capsule; Take 2 capsules (2 g total) by mouth 2 (two) times daily.  Dispense: 120 capsule; Refill: 5 - For home use only DME Other see comment  8. MDD (major depressive disorder), recurrent episode, moderate ([name])  - ARIPiprazole (ABILIFY) 2 MG tablet; Take 1 tablet (2 mg total) by mouth every evening.  Dispense: 30 tablet; Refill: 5 - buPROPion (WELLBUTRIN XL) 150 MG 24 hr tablet; Take 1 tablet (150 mg total) by mouth daily.  Dispense: 30 tablet; Refill: 5 - sertraline (ZOLOFT) 100 MG tablet; Take 1.5 tablets (150 mg total) by mouth daily.  Dispense: 45 tablet; Refill: 5  9. Chronic pain of left knee  - Ambulatory referral to Orthopedic Surgery - For home use only DME Other see comment  10. Gastroesophageal reflux disease without  esophagitis  - omeprazole (PRILOSEC) 40 MG capsule; Take 1 capsule (40 mg total) by mouth daily.  Dispense: 30 capsule; Refill: 5   11. Small vessel disease ([name])  She is back on statin therapy

## 2023-06-18 ENCOUNTER — Encounter: Payer: Self-pay | Admitting: Family Medicine

## 2023-06-18 ENCOUNTER — Ambulatory Visit (INDEPENDENT_AMBULATORY_CARE_PROVIDER_SITE_OTHER): Payer: No Typology Code available for payment source | Admitting: Family Medicine

## 2023-06-18 VITALS — BP 126/82 | HR 84 | Resp 16 | Ht 62.0 in | Wt 213.0 lb

## 2023-06-18 DIAGNOSIS — E11610 Type 2 diabetes mellitus with diabetic neuropathic arthropathy: Secondary | ICD-10-CM

## 2023-06-18 DIAGNOSIS — I10 Essential (primary) hypertension: Secondary | ICD-10-CM | POA: Diagnosis not present

## 2023-06-18 DIAGNOSIS — E785 Hyperlipidemia, unspecified: Secondary | ICD-10-CM

## 2023-06-18 DIAGNOSIS — F99 Mental disorder, not otherwise specified: Secondary | ICD-10-CM | POA: Diagnosis not present

## 2023-06-18 DIAGNOSIS — K219 Gastro-esophageal reflux disease without esophagitis: Secondary | ICD-10-CM | POA: Diagnosis not present

## 2023-06-18 DIAGNOSIS — M25562 Pain in left knee: Secondary | ICD-10-CM

## 2023-06-18 DIAGNOSIS — E1169 Type 2 diabetes mellitus with other specified complication: Secondary | ICD-10-CM | POA: Diagnosis not present

## 2023-06-18 DIAGNOSIS — I739 Peripheral vascular disease, unspecified: Secondary | ICD-10-CM | POA: Diagnosis not present

## 2023-06-18 DIAGNOSIS — F5105 Insomnia due to other mental disorder: Secondary | ICD-10-CM

## 2023-06-18 DIAGNOSIS — I5032 Chronic diastolic (congestive) heart failure: Secondary | ICD-10-CM

## 2023-06-18 DIAGNOSIS — F331 Major depressive disorder, recurrent, moderate: Secondary | ICD-10-CM | POA: Diagnosis not present

## 2023-06-18 DIAGNOSIS — N1832 Chronic kidney disease, stage 3b: Secondary | ICD-10-CM | POA: Diagnosis not present

## 2023-06-18 DIAGNOSIS — G8929 Other chronic pain: Secondary | ICD-10-CM

## 2023-06-18 MED ORDER — ARIPIPRAZOLE 2 MG PO TABS
2.0000 mg | ORAL_TABLET | Freq: Every evening | ORAL | 5 refills | Status: DC
Start: 2023-06-18 — End: 2024-01-07

## 2023-06-18 MED ORDER — TRAZODONE HCL 100 MG PO TABS
100.0000 mg | ORAL_TABLET | Freq: Every evening | ORAL | 5 refills | Status: DC | PRN
Start: 2023-06-18 — End: 2023-12-03

## 2023-06-18 MED ORDER — BUPROPION HCL ER (XL) 150 MG PO TB24
150.0000 mg | ORAL_TABLET | Freq: Every day | ORAL | 5 refills | Status: DC
Start: 2023-06-18 — End: 2024-04-04

## 2023-06-18 MED ORDER — AMLODIPINE BESYLATE 5 MG PO TABS
5.0000 mg | ORAL_TABLET | Freq: Every day | ORAL | 5 refills | Status: DC
Start: 2023-06-18 — End: 2024-01-07

## 2023-06-18 MED ORDER — PREGABALIN 150 MG PO CAPS
150.0000 mg | ORAL_CAPSULE | Freq: Two times a day (BID) | ORAL | 0 refills | Status: DC
Start: 2023-06-18 — End: 2024-04-04

## 2023-06-18 MED ORDER — OMEPRAZOLE 40 MG PO CPDR
40.0000 mg | DELAYED_RELEASE_CAPSULE | Freq: Every day | ORAL | 5 refills | Status: DC
Start: 2023-06-18 — End: 2024-01-07

## 2023-06-18 MED ORDER — FUROSEMIDE 20 MG PO TABS
20.0000 mg | ORAL_TABLET | Freq: Every day | ORAL | 5 refills | Status: DC
Start: 2023-06-18 — End: 2024-04-04

## 2023-06-18 MED ORDER — METOPROLOL TARTRATE 50 MG PO TABS
25.0000 mg | ORAL_TABLET | Freq: Two times a day (BID) | ORAL | 5 refills | Status: DC
Start: 2023-06-18 — End: 2024-04-04

## 2023-06-18 MED ORDER — ROSUVASTATIN CALCIUM 40 MG PO TABS
40.0000 mg | ORAL_TABLET | Freq: Every day | ORAL | 5 refills | Status: DC
Start: 2023-06-18 — End: 2024-04-04

## 2023-06-18 MED ORDER — EZETIMIBE 10 MG PO TABS
10.0000 mg | ORAL_TABLET | Freq: Every day | ORAL | 5 refills | Status: DC
Start: 2023-06-18 — End: 2024-04-04

## 2023-06-18 MED ORDER — SERTRALINE HCL 100 MG PO TABS
150.0000 mg | ORAL_TABLET | Freq: Every day | ORAL | 5 refills | Status: DC
Start: 2023-06-18 — End: 2023-12-03

## 2023-06-18 MED ORDER — VASCEPA 1 G PO CAPS
2.0000 g | ORAL_CAPSULE | Freq: Two times a day (BID) | ORAL | 5 refills | Status: DC
Start: 2023-06-18 — End: 2023-12-03

## 2023-06-19 ENCOUNTER — Other Ambulatory Visit: Payer: Self-pay

## 2023-06-19 DIAGNOSIS — E11610 Type 2 diabetes mellitus with diabetic neuropathic arthropathy: Secondary | ICD-10-CM

## 2023-06-21 ENCOUNTER — Encounter: Payer: Self-pay | Admitting: Internal Medicine

## 2023-06-21 ENCOUNTER — Ambulatory Visit: Payer: No Typology Code available for payment source | Attending: Internal Medicine | Admitting: Internal Medicine

## 2023-06-21 NOTE — Progress Notes (Deleted)
Cardiology Office Note:  .   Date:  06/21/2023  ID:  Summer [name], DOB 1951/05/05, MRN [id number] PCP: Alba Cory, MD  [facility] Providers Cardiologist:  Yvonne Kendall, MD { Click to update primary MD,subspecialty MD or APP then REFRESH:1}    History of Present Illness: .   Summer [name] is a 72 y.o. female with history of chronic HFpEF, hyperlipidemia, type 2 diabetes mellitus complicated by peripheral neuropathy and Charcot foot, asthma, obstructive sleep apnea, and morbid obesity, who presents for follow-up of HFpEF.  I last saw her in July, at which time she was feeling well, having returned home after being hospitalized in 08/2022 with Streptococcus pneumonia bacteremia complicated by seizure and PRES. mild sinus bradycardia, borderline low blood pressure, and first-degree AV block prompted [place] to decrease metoprolol to tartrate to 25 mg twice daily.  ROS: See HPI  Studies Reviewed: Marland Kitchen        TEE (08/16/2022): Normal LVEF (60-65%).  Normal RV size and function.  No left atrial appendage/left atrial thrombus.  Normal RV size without thrombus.  No pericardial effusion.  Normal mitral valve with trivial regurgitation.  Normal tricuspid valve.  Normal aortic valve.  Normal pulmonic valve.  No atrial level shunt by color Doppler.  No vegetation identified.   TTE (08/14/2022): Normal LV size with moderate asymmetric LVH of the septum.  LVEF 60-65%.  Indeterminate diastolic parameters.  Normal RV size and function.  Normal biatrial size.  Mild thickening and moderate calcification of the mitral valve leaflets.  Moderate mitral annular calcification.  No mitral regurgitation or stenosis.  Normal tricuspid valve.  Aortic sclerosis without stenosis or regurgitation.   Myocardial PET/CT (07/23/2019, [facility]): Low risk study without ischemia or scar.  LVEF greater than 65%.  Mild coronary artery calcification noted.  Mitral annular calcification present.  Risk Assessment/Calculations:    {Does this patient have ATRIAL FIBRILLATION?:[id number]} No BP recorded.  {Refresh Note OR Click here to enter BP  :1}***       Physical Exam:   VS:  There were no vitals taken for this visit.   Wt Readings from Last 3 Encounters:  06/18/23 213 lb (96.6 kg)  05/17/23 204 lb 3.2 oz (92.6 kg)  05/02/23 206 lb (93.4 kg)    General:  NAD. Neck: No JVD or HJR. Lungs: Clear to auscultation bilaterally without wheezes or crackles. Heart: Regular rate and rhythm without murmurs, rubs, or gallops. Abdomen: Soft, nontender, nondistended. Extremities: No lower extremity edema.  ASSESSMENT AND PLAN: .    ***    {Are you ordering a CV Procedure (e.g. stress test, cath, DCCV, TEE, etc)?   Press F2        :[id number]}  Dispo: ***  Signed, Yvonne Kendall, MD

## 2023-06-22 ENCOUNTER — Ambulatory Visit (INDEPENDENT_AMBULATORY_CARE_PROVIDER_SITE_OTHER): Payer: No Typology Code available for payment source

## 2023-06-22 ENCOUNTER — Encounter: Payer: Self-pay | Admitting: Podiatry

## 2023-06-22 ENCOUNTER — Ambulatory Visit: Payer: No Typology Code available for payment source | Admitting: Podiatry

## 2023-06-22 VITALS — BP 145/48 | HR 65

## 2023-06-22 DIAGNOSIS — M2042 Other hammer toe(s) (acquired), left foot: Secondary | ICD-10-CM | POA: Diagnosis not present

## 2023-06-22 DIAGNOSIS — M86172 Other acute osteomyelitis, left ankle and foot: Secondary | ICD-10-CM

## 2023-06-22 DIAGNOSIS — E0843 Diabetes mellitus due to underlying condition with diabetic autonomic (poly)neuropathy: Secondary | ICD-10-CM | POA: Diagnosis not present

## 2023-06-22 DIAGNOSIS — Z9889 Other specified postprocedural states: Secondary | ICD-10-CM

## 2023-06-22 DIAGNOSIS — L97522 Non-pressure chronic ulcer of other part of left foot with fat layer exposed: Secondary | ICD-10-CM | POA: Diagnosis not present

## 2023-06-22 NOTE — Progress Notes (Signed)
Chief Complaint  Patient presents with   Routine Post Op    POV #2   DOS 05/02/2023 LEFT FOOT PARTIAL AMPUTATION 1st & 2nd TOE "It's alright, just a little swollen.  It seems to be healing pretty good.  Sometime it stays red."    HPI: 72 y.o. female PMHx diabetes mellitus presenting for routine footcare.  Patient is completely neuropathic.  She has a recent history of partial toe amputations to the left foot.  She has noticed recently some drainage coming from the distal tip of the left third toe.  Presenting for further treatment and evaluation  Past Medical History:  Diagnosis Date   Anemia    Arthritis    Asthma    B12 deficiency    Back pain    Carpal tunnel syndrome    Chronic kidney disease    Constipation    Depression    Depressive disorder    Diabetes mellitus    Dyspnea    Fluid retention    Foot pain    GERD (gastroesophageal reflux disease)    Headache    History of hiatal hernia    Hyperlipidemia    Hypertension    IBS (irritable bowel syndrome)    Insomnia    Joint pain    Lumbago    Memory loss    Obesity    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)    Pneumonia    Rhinitis, allergic    Rosacea    Sleep apnea    SOB (shortness of breath)    Thyroid disease    Unspecified hearing loss    Unspecified hereditary and idiopathic peripheral neuropathy    Unspecified sleep apnea    Vitamin D deficiency      Objective: Physical Exam General: The patient is alert and oriented x3 in no acute distress.  Dermatology: Incisions are healed.  There is a staple remaining in the left second toe partial amputation site.  This was removed today without incident or complication  Ulcer noted to the distal tip of the left third toe secondary to reducible hammertoe deformity.  With debridement there is a granular wound base.  No exposed bone muscle tendon ligament or joint.  No indication of acute infection  Vascular: Palpable pedal pulses bilaterally. No edema or erythema  noted. Capillary refill within normal limits.  Neurological: Light touch and protective threshold grossly absent bilateral  Musculoskeletal Exam: History of prior amputation left fifth toe and partial amputations to the great toe and second digit.  Reducible hammertoe deformity noted to the left third digit creating pressure to the distal tip of the toe and ulceration  Assessment: 1.  Reducible hammertoe left third digit 2.  History of partial amputation left great toe and second digit   Plan of Care:  1. Patient evaluated.  Medically necessary excisional debridement including subcutaneous tissue was performed using a tissue nipper.  Excisional debridement of the necrotic nonviable tissue down to healthier bleeding viable tissue was performed with postdebridement measurement same as pre- 2.  Different treatment options were discussed with the patient. 3.  After evaluating the patient I do believe that a flexor tenotomy to the respective digit would help alleviate the patient's symptoms.  This would lift the toe to alleviate pressure from the digit.  The procedure was explained in detail and all patient questions were answered.  No guarantees were expressed or implied.  The patient consented for correction here in the office 4.  Prior to procedure the toe  was blocked in a digital block fashion using 3 mL of lidocaine 2%. 5.  Flexor tenotomy was performed of the respective digit using a surgical #11 scalpel and a small percutaneous stab incision on the plantar sulcus of the toe.  The toe was immediately in a more rectus position.  Betadine soaked dry sterile dressing was applied. 6.  Post care instructions were provided 7.  Surgical shoe dispensed 8.  Return to clinic in 1 week    Felecia Shelling, DPM [facility]  Dr. Felecia Shelling, DPM    [address] [place], [place] [address]                Office [phone]  Fax [phone]

## 2023-06-26 DIAGNOSIS — N184 Chronic kidney disease, stage 4 (severe): Secondary | ICD-10-CM | POA: Diagnosis not present

## 2023-06-26 DIAGNOSIS — I10 Essential (primary) hypertension: Secondary | ICD-10-CM | POA: Diagnosis not present

## 2023-06-26 DIAGNOSIS — D631 Anemia in chronic kidney disease: Secondary | ICD-10-CM | POA: Diagnosis not present

## 2023-06-26 DIAGNOSIS — R809 Proteinuria, unspecified: Secondary | ICD-10-CM | POA: Diagnosis not present

## 2023-06-26 DIAGNOSIS — E1122 Type 2 diabetes mellitus with diabetic chronic kidney disease: Secondary | ICD-10-CM | POA: Diagnosis not present

## 2023-06-26 LAB — PROTEIN / CREATININE RATIO, URINE: Creatinine, Urine: 57

## 2023-06-26 LAB — MICROALBUMIN, URINE: Microalb, Ur: 0.7

## 2023-06-29 ENCOUNTER — Ambulatory Visit (INDEPENDENT_AMBULATORY_CARE_PROVIDER_SITE_OTHER): Payer: No Typology Code available for payment source | Admitting: Podiatry

## 2023-06-29 DIAGNOSIS — L97522 Non-pressure chronic ulcer of other part of left foot with fat layer exposed: Secondary | ICD-10-CM

## 2023-06-29 NOTE — Progress Notes (Signed)
   No chief complaint on file.   HPI: 72 y.o. female PMHx diabetes mellitus presenting for follow-up evaluation after flexor tenotomy performed on 06/22/2023 to the left third toe.  Patient doing well.  She left the dressings clean dry and intact since last visit  Past Medical History:  Diagnosis Date   Anemia    Arthritis    Asthma    B12 deficiency    Back pain    Carpal tunnel syndrome    Chronic kidney disease    Constipation    Depression    Depressive disorder    Diabetes mellitus    Dyspnea    Fluid retention    Foot pain    GERD (gastroesophageal reflux disease)    Headache    History of hiatal hernia    Hyperlipidemia    Hypertension    IBS (irritable bowel syndrome)    Insomnia    Joint pain    Lumbago    Memory loss    Obesity    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)    Pneumonia    Rhinitis, allergic    Rosacea    Sleep apnea    SOB (shortness of breath)    Thyroid disease    Unspecified hearing loss    Unspecified hereditary and idiopathic peripheral neuropathy    Unspecified sleep apnea    Vitamin D deficiency      Objective: Physical Exam General: The patient is alert and oriented x3 in no acute distress.  Dermatology: There continues to be a wound to the distal portion of the great toe but all other incision sites and wounds are healed.  Vascular: Palpable pedal pulses bilaterally. No edema or erythema noted. Capillary refill within normal limits.  Neurological: Light touch and protective threshold grossly absent bilateral  Musculoskeletal Exam: History of prior amputation left fifth toe and partial amputations to the great toe and second digit.  The third toe is in good rectus alignment today with alleviation of pressure from the distal tip  Assessment: 1.  Reducible hammertoe left third digit 2.  S/P flexor tenotomy left third digit 06/22/2023  3.  History of partial amputation left great toe and second digit   Plan of Care:  -Patient  evaluated -The third toe appears to be resolved of the hammertoe contracture and distal wound.  The percutaneous incision along the plantar aspect of the toe is healed. -Continue Betadine with a light dressing to the great toe -Continue to recommend open toed shoes and sandals -Return to clinic 1 month  Felecia Shelling, DPM [facility]  Dr. Felecia Shelling, DPM    [address] [place], [place] [address]                Office [phone]  Fax [phone]

## 2023-07-13 ENCOUNTER — Ambulatory Visit: Payer: No Typology Code available for payment source

## 2023-07-13 NOTE — Progress Notes (Signed)
Patient presents to the office today for diabetic shoe and insole measuring.  Patient was measured with brannock device to determine size and width for 1 pair of extra depth shoes and foam casted for 3 pair of insoles.   Documentation of medical necessity will be sent to patient's treating diabetic doctor to verify and sign.   Patient's diabetic provider: O'connell  Shoes and insoles will be ordered at that time and patient will be notified for an appointment for fitting when they arrive.   Shoe size (per patient): 8.5 Brannock measurement: 7.5 Patient shoe selection- Shoe choice:   [id number] / [id number] Brooks white velcro Shoe size ordered: [employer]  Chief Executive Officer

## 2023-07-18 NOTE — Progress Notes (Unsigned)
Name: Summer [name]   MRN: [id number]    DOB: 1950/11/13   Date:07/19/2023       Progress Note  Subjective  Chief Complaint  Follow Up  HPI  History of Hypothyroidism: she has been off levothyroxine for over one month and TSH done by Dr. Turner Daniels in Sept was normal.  Unchanged   COPD: from second hand smoking, last visit with Dr. Belia Heman was 10/2021 and gave her Feliberto Gottron - she is out of medication due to cost  . She has SOB with activity that has been chronic  She continues to have a daily cough. She also has a history of asthma but has been out of singulair and is doing well. Discussed getting her on assistance program for [id number] but she states it was not helping anyways so we did not apply for it   Atherosclerosis aorta and dyslipidemia: taking vascepa , rosuvastatin and  zetia,  Morbid Obesity/Malnutrition : weight was  238 lbs done  but was hospitalized in Dec with sepsis and lost a lot of weight after that had another admission this Summer for amputation of left 1st and 2nd toes and weight upon discharge was down to 205 lbs today is up to 222 lbs. She asked for medication for weight loss. Advised her to discuss GLP-1 agonist during her next visit with Endo   MDD- recurrent and moderate: she is currently only taking sertraline and bupropion , she used to see a psychiatrist but lost to follow up. She is also taking low dose of Abilify and denies side effects, we will not increase dose since in the past she developed smacking of lips  She continues to have lack of energy but mood is better . She states her boyfriend asks her to be active and that makes her upset  Diabetes type II insulin requiring  : she is under the care of Endocrinologist, Dr. Gershon Crane , last A1C was done 05/2023 and it was at 7.6 % She has dyslipidemia, obesity, CKI , charcots foot , diabetic neuropathy, and HTN associated with DM. She is now on Farxiga from assistance program off Metformin but still taking [place] . Glucose has  been better at home, this morning 160 fasting   Charcot foot and history of left 5th toe amputation due to osteomyelitis: under the care of Dr. Logan Bores, she will go back soon for a follow up  CKI stage 3b:  She is on ARB and Farxiga , bp is controlled, under the care of nephrologist - Dr. Cherylann Ratel. She has good urine output and no pruritus. She has been compliant with pill pack from [employer] . Last GFR  done October 2024 was 33   Heart failure with preserved ejection fracture : EF 65 % but has mild increase in left ventricular wall thickness . She was recently by Dr. Okey Dupre and lopressor dose was changed to half pill bid, furosemide, SGL-2 agonist.  She denies orthopnea, she has sob with activity that is likely multifactorial   OSA: she is unable to wear her CPAP. Denies morning headaches , she snores   HTN: BP is at goal, continue medication   Abnormal MRI brain/ Small vessel disease :  discussed results with patient during her visit , continue aggressive control of DM, statin therapy and monitor. She has been compliant with Vascepa again   Chronic bilateral knee pain: but left knee worse than right and has instability, she uses a walker but states having difficulty walking longer distances , we sent  rx of scooter but could not afford it    Patient Active Problem List   Diagnosis Date Noted   CKD stage 3a, GFR 45-59 ml/min ([name]) 05/01/2023   Small vessel disease ([name]) 03/27/2023   Coronary artery disease involving native coronary artery of native heart without angina pectoris 03/16/2023   First degree AV block 03/16/2023   Posterior reversible encephalopathy syndrome 08/21/2022   Seizure ([name]) 08/20/2022   Multiple falls 08/15/2022   Gouty arthritis 08/15/2022   Diabetic ulcer of left midfoot associated with diabetes mellitus due to underlying condition, limited to breakdown of skin ([name]) 08/15/2022   Metabolic acidosis 08/11/2022   Ulcer of left foot due to type 2 diabetes mellitus ([name])  08/10/2022   Atherosclerosis of aorta ([name]) 07/04/2022   Insomnia due to other mental disorder 07/04/2022   Amputated toe of left foot ([name]) 07/04/2022   Chronic obstructive pulmonary disease ([name]) 01/28/2021   PAD (peripheral artery disease) ([name]) 12/03/2020   B12 deficiency 05/24/2020   Vitamin D deficiency 05/24/2020   Metatarsalgia of left foot 01/15/2020   MDD (major depressive disorder), recurrent episode, moderate ([name]) 04/04/2019   (HFpEF) heart failure with preserved ejection fraction ([name]) 08/31/2017   Moderate persistent asthma 08/31/2017   Hyperlipidemia associated with type 2 diabetes mellitus ([name]) 06/14/2017   Charcot foot due to diabetes mellitus ([name]) 02/22/2016   Acquired abduction deformity of foot 07/12/2015   Osteoarthritis of subtalar joint 07/12/2015   Carpal tunnel syndrome 04/17/2015   Chronic constipation 04/17/2015   Stage 3b chronic kidney disease ([name]) 04/17/2015   Decreased exercise tolerance 04/17/2015   Diabetes mellitus with polyneuropathy ([name]) 04/17/2015   Gastroesophageal reflux disease without esophagitis 04/17/2015   Bursitis, trochanteric 04/17/2015   Cephalalgia 04/17/2015   Essential hypertension 04/17/2015   Adult hypothyroidism 04/17/2015   Hearing loss 04/17/2015   Chronic recurrent major depressive disorder ([name]) 04/17/2015   Neurogenic claudication 04/17/2015   Morbid obesity with BMI of 40.0-44.9, adult ([name]) 04/17/2015   Hypo-ovarianism 04/17/2015   Perennial allergic rhinitis with seasonal variation 04/17/2015   Acne erythematosa 04/17/2015   Dyskinesia, tardive 04/17/2015   Memory loss 04/17/2015   Impingement syndrome of shoulder 04/17/2015   Dermatitis, stasis 04/17/2015   Obstructive sleep apnea 05/14/2014   Shortness of breath on exertion 05/06/2014   Mixed hyperlipidemia 02/06/2012   Low back pain 09/16/2008    Past Surgical History:  Procedure Laterality Date   ABDOMINAL HYSTERECTOMY  1975   AMPUTATION TOE Left  05/02/2023   Procedure: LEFT FOOT PARTIAL AMPUTATION 1st & 2nd TOE;  Surgeon: Edwin Cap, DPM;  Location: [facility] ORS;  Service: Orthopedics/Podiatry;  Laterality: Left;   ANKLE SURGERY Left approx Jan 2018   APPENDECTOMY  1970   CATARACT EXTRACTION  01/2011   right   COLONOSCOPY WITH PROPOFOL N/A 08/19/2019   Procedure: COLONOSCOPY WITH PROPOFOL;  Surgeon: Wyline Mood, MD;  Location: [facility] ENDOSCOPY;  Service: Gastroenterology;  Laterality: N/A;   eye lid surgery  2013   bilateral   FOOT SURGERY     LOWER EXTREMITY ANGIOGRAPHY Left 11/23/2020   Procedure: LOWER EXTREMITY ANGIOGRAPHY;  Surgeon: Renford Dills, MD;  Location: [facility] INVASIVE CV LAB;  Service: Cardiovascular;  Laterality: Left;   NECK SURGERY     SPINE SURGERY     TEE WITHOUT CARDIOVERSION N/A 08/16/2022   Procedure: TRANSESOPHAGEAL ECHOCARDIOGRAM (TEE);  Surgeon: Debbe Odea, MD;  Location: [facility] ORS;  Service: Cardiovascular;  Laterality: N/A;   TUBAL LIGATION     VAGINAL HYSTERECTOMY  1989    Family History  Problem Relation Age of Onset   Aneurysm Mother    Aortic aneurysm Mother    Hypertension Mother    Hyperlipidemia Mother    Heart disease Mother    Obesity Mother    Heart attack Maternal Grandfather    Diabetes Maternal Grandfather     Social History   Tobacco Use   Smoking status: Never   Smokeless tobacco: Never  Substance Use Topics   Alcohol use: No    Alcohol/week: 0.0 standard drinks of alcohol     Current Outpatient Medications:    acetaminophen (TYLENOL 8 HOUR) 650 MG CR tablet, Take 1 tablet (650 mg total) by mouth every 8 (eight) hours as needed for pain., Disp: 90 tablet, Rfl: 2   amLODipine (NORVASC) 5 MG tablet, Take 1 tablet (5 mg total) by mouth daily., Disp: 30 tablet, Rfl: 5   ARIPiprazole (ABILIFY) 2 MG tablet, Take 1 tablet (2 mg total) by mouth every evening., Disp: 30 tablet, Rfl: 5   ASPIRIN LOW DOSE 81 MG EC tablet, TAKE 1 TABLET (81 MG TOTAL) BY MOUTH DAILY., Disp:  30 tablet, Rfl: 0   B-D ULTRAFINE III SHORT PEN 31G X 8 MM MISC, , Disp: , Rfl:    buPROPion (WELLBUTRIN XL) 150 MG 24 hr tablet, Take 1 tablet (150 mg total) by mouth daily., Disp: 30 tablet, Rfl: 5   Cholecalciferol (VITAMIN D3) 25 MCG (1000 UT) CAPS, Take 1,000 Units by mouth daily., Disp: , Rfl:    Continuous Blood Gluc Sensor (FREESTYLE LIBRE 2 SENSOR) MISC, FOR GLUCOSE MONITORING, Disp: , Rfl:    cyanocobalamin (VITAMIN B12) 500 MCG tablet, Take 500 mcg by mouth daily., Disp: , Rfl:    diclofenac Sodium (VOLTAREN) 1 % GEL, Apply 2 g topically 2 (two) times daily., Disp: , Rfl:    ezetimibe (ZETIA) 10 MG tablet, Take 1 tablet (10 mg total) by mouth daily., Disp: 30 tablet, Rfl: 5   FARXIGA 10 MG TABS tablet, Take 10 mg by mouth every morning., Disp: , Rfl:    feeding supplement (ENSURE ENLIVE / ENSURE PLUS) LIQD, Take 237 mLs by mouth 3 (three) times daily., Disp: 237 mL, Rfl: 12   furosemide (LASIX) 20 MG tablet, Take 1 tablet (20 mg total) by mouth daily., Disp: 30 tablet, Rfl: 5   metFORMIN (GLUCOPHAGE) 500 MG tablet, Take 2 tablets by mouth daily with breakfast., Disp: 90 tablet, Rfl: 0   metoprolol tartrate (LOPRESSOR) 50 MG tablet, Take 0.5 tablets (25 mg total) by mouth 2 (two) times daily., Disp: 30 tablet, Rfl: 5   Misc Natural Products (OSTEO BI-FLEX ADV JOINT SHIELD PO), Take by mouth 2 (two) times daily., Disp: , Rfl:    omeprazole (PRILOSEC) 40 MG capsule, Take 1 capsule (40 mg total) by mouth daily., Disp: 30 capsule, Rfl: 5   Probiotic Product (PROBIOTIC ADVANCED PO), Take 1 tablet by mouth daily., Disp: , Rfl:    rosuvastatin (CRESTOR) 40 MG tablet, Take 1 tablet (40 mg total) by mouth daily., Disp: 30 tablet, Rfl: 5   sertraline (ZOLOFT) 100 MG tablet, Take 1.5 tablets (150 mg total) by mouth daily., Disp: 45 tablet, Rfl: 5   traZODone (DESYREL) 100 MG tablet, Take 1 tablet (100 mg total) by mouth at bedtime as needed for sleep., Disp: 30 tablet, Rfl: 5   TRESIBA FLEXTOUCH  200 UNIT/ML FlexTouch Pen, SMARTSIG:60 Unit(s) SUB-Q Daily, Disp: , Rfl:    VASCEPA 1 g capsule, Take 2 capsules (  2 g total) by mouth 2 (two) times daily., Disp: 120 capsule, Rfl: 5   pregabalin (LYRICA) 150 MG capsule, Take 1 capsule (150 mg total) by mouth 2 (two) times daily., Disp: 60 capsule, Rfl: 0  Allergies  Allergen Reactions   Codeine Other (See Comments)    "TRIPPED OUT"  DIDN'T LIKE THE WAY IT FELT   Atorvastatin     muscle pain   Hydrocodone     itching   Tramadol    Latex Rash   Zolpidem Other (See Comments)    Sleep walk    I personally reviewed active problem list, medication list, allergies, family history, social history, health maintenance with the patient/caregiver today.   ROS  Ten systems reviewed and is negative except as mentioned in HPI    Objective  Vitals:   07/19/23 1123  BP: 138/60  Pulse: 68  Resp: 20  Temp: 98 F (36.7 C)  TempSrc: Oral  SpO2: 98%  Weight: 222 lb 6.4 oz (100.9 kg)  Height: 5\' 2"  (1.575 m)    Body mass index is 40.68 kg/m.  Physical Exam  Constitutional: Patient appears well-developed and well-nourished. Obese  No distress.  HEENT: head atraumatic, normocephalic, pupils equal and reactive to light, neck supple Cardiovascular: Normal rate, regular rhythm and normal heart sounds.  No murmur heard. No BLE edema. Pulmonary/Chest: Effort normal and breath sounds normal. No respiratory distress. Abdominal: Soft.  There is no tenderness. Muscular skeletal: small healing spot on amputated toe bed  Psychiatric: Patient has a normal mood and affect. behavior is normal. Judgment and thought content normal.    PHQ2/9:    07/19/2023   11:25 AM 06/18/2023    3:39 PM 05/17/2023   10:57 AM 05/01/2023   11:31 AM 01/29/2023    2:23 PM  Depression screen PHQ 2/9  Decreased Interest 0 0 0 0 3  Down, Depressed, Hopeless 0 0 0 0 0  PHQ - 2 Score 0 0 0 0 3  Altered sleeping 0 0 0 0 3  Tired, decreased energy 0 0 0 3 0  Change in  appetite 0 0 0 3 0  Feeling bad or failure about yourself  0 0 0 0 0  Trouble concentrating 0 0 0 0 0  Moving slowly or fidgety/restless 0 0 0 0 0  Suicidal thoughts 0 0 0 0 0  PHQ-9 Score 0 0 0 6 6  Difficult doing work/chores   Not difficult at all Somewhat difficult Not difficult at all    phq 9 is negative   Fall Risk:    07/19/2023   11:25 AM 06/18/2023    3:39 PM 05/17/2023   10:56 AM 05/01/2023   11:30 AM 03/27/2023   11:39 AM  Fall Risk   Falls in the past year? 1 1 1  0 0  Number falls in past yr: 1 1 1   0  Injury with Fall? 1 1 1   0  Risk for fall due to : History of fall(s);Impaired balance/gait Impaired balance/gait Impaired balance/gait Impaired balance/gait Impaired balance/gait  Follow up Falls prevention discussed;Education provided;Falls evaluation completed Falls prevention discussed Falls prevention discussed;Education provided;Falls evaluation completed Falls prevention discussed;Education provided;Falls evaluation completed Falls prevention discussed      Functional Status Survey: Is the patient deaf or have difficulty hearing?: No Does the patient have difficulty seeing, even when wearing glasses/contacts?: No Does the patient have difficulty concentrating, remembering, or making decisions?: No Does the patient have difficulty walking or climbing stairs?: Yes  Does the patient have difficulty dressing or bathing?: No Does the patient have difficulty doing errands alone such as visiting a doctor's office or shopping?: Yes    Assessment & Plan  1. Dyslipidemia associated with type 2 diabetes mellitus ([name])  Under the care of Endo   2. Stage 3b chronic kidney disease ([name])  stable  3. Chronic heart failure with preserved ejection fraction ([name])  stable  4. MDD (major depressive disorder), recurrent episode, moderate ([name])  Discussed referral to psychiatrist again   5. Charcot foot due to diabetes mellitus ([name])  Keep diabetes under control   6.  Chronic obstructive pulmonary disease, unspecified COPD type ([name])  Off medications , states did not work   7. Atherosclerosis of aorta ([name])  Continue medications   8. Amputated toe of left foot ([name])  Keep follow up with Dr. Logan Bores  9. Morbid obesity ([name])  Gaining weight since last hospital stay , she asked about weight loss medication, explained she may be able to take GLP-1 agonist   10. Need for immunization against influenza  - Flu Vaccine Trivalent High Dose (Fluad)

## 2023-07-19 ENCOUNTER — Ambulatory Visit: Payer: No Typology Code available for payment source | Admitting: Family Medicine

## 2023-07-19 ENCOUNTER — Encounter: Payer: Self-pay | Admitting: Family Medicine

## 2023-07-19 VITALS — BP 138/60 | HR 68 | Temp 98.0°F | Resp 20 | Ht 62.0 in | Wt 222.4 lb

## 2023-07-19 DIAGNOSIS — S98132D Complete traumatic amputation of one left lesser toe, subsequent encounter: Secondary | ICD-10-CM | POA: Diagnosis not present

## 2023-07-19 DIAGNOSIS — Z23 Encounter for immunization: Secondary | ICD-10-CM | POA: Diagnosis not present

## 2023-07-19 DIAGNOSIS — E11610 Type 2 diabetes mellitus with diabetic neuropathic arthropathy: Secondary | ICD-10-CM

## 2023-07-19 DIAGNOSIS — N1832 Chronic kidney disease, stage 3b: Secondary | ICD-10-CM

## 2023-07-19 DIAGNOSIS — I5032 Chronic diastolic (congestive) heart failure: Secondary | ICD-10-CM | POA: Diagnosis not present

## 2023-07-19 DIAGNOSIS — S98132A Complete traumatic amputation of one left lesser toe, initial encounter: Secondary | ICD-10-CM

## 2023-07-19 DIAGNOSIS — J449 Chronic obstructive pulmonary disease, unspecified: Secondary | ICD-10-CM

## 2023-07-19 DIAGNOSIS — E1169 Type 2 diabetes mellitus with other specified complication: Secondary | ICD-10-CM

## 2023-07-19 DIAGNOSIS — E785 Hyperlipidemia, unspecified: Secondary | ICD-10-CM | POA: Diagnosis not present

## 2023-07-19 DIAGNOSIS — F331 Major depressive disorder, recurrent, moderate: Secondary | ICD-10-CM

## 2023-07-19 DIAGNOSIS — I700 Atherosclerosis of aorta: Secondary | ICD-10-CM | POA: Diagnosis not present

## 2023-07-23 DIAGNOSIS — H43813 Vitreous degeneration, bilateral: Secondary | ICD-10-CM | POA: Diagnosis not present

## 2023-07-23 DIAGNOSIS — H35342 Macular cyst, hole, or pseudohole, left eye: Secondary | ICD-10-CM | POA: Diagnosis not present

## 2023-07-23 DIAGNOSIS — E119 Type 2 diabetes mellitus without complications: Secondary | ICD-10-CM | POA: Diagnosis not present

## 2023-07-23 LAB — HM DIABETES EYE EXAM

## 2023-07-27 ENCOUNTER — Ambulatory Visit: Payer: No Typology Code available for payment source | Admitting: Podiatry

## 2023-07-30 DIAGNOSIS — G8929 Other chronic pain: Secondary | ICD-10-CM | POA: Diagnosis not present

## 2023-07-30 DIAGNOSIS — G63 Polyneuropathy in diseases classified elsewhere: Secondary | ICD-10-CM | POA: Diagnosis not present

## 2023-07-30 DIAGNOSIS — M1712 Unilateral primary osteoarthritis, left knee: Secondary | ICD-10-CM | POA: Diagnosis not present

## 2023-08-01 NOTE — Progress Notes (Signed)
Inserts are here wtg on shoes  Summer [name]

## 2023-08-03 ENCOUNTER — Ambulatory Visit: Payer: Self-pay | Admitting: *Deleted

## 2023-08-03 NOTE — Telephone Encounter (Signed)
  Chief Complaint: urinary frequency, incontinence Symptoms: urinary frequency esp at night. Up all night. Incontinent , saturates pull ups standing.  Strong odor.  Frequency: 2-3 weeks ago  Pertinent Negatives: Patient denies fever no pain no burning with urination.  Disposition: [] ED /[] Urgent Care (no appt availability in office) / [x] Appointment(In office/virtual)/ []  [facility]/ [] Home Care/ [] Refused Recommended Disposition /[] [facility]/ []  Follow-up with PCP Additional Notes:   Offered appt today patient reports she can not make today . Scheduled appt 08/06/23. Recommended if sx worsen go to UC. Patient needs to be seen    Summary: Frequent/uncontrolled urination   Patient states that she has been having frequent and uncontrolled urination for 3 weeks. Patient states she saturates several adult pull ups per day. Patient says there is a strong smell , but denies any pain or burning.          Reason for Disposition  Urinating more frequently than usual (i.e., frequency)  Answer Assessment - Initial Assessment Questions 1. SYMPTOM: "What's the main symptom you're concerned about?" (e.g., frequency, incontinence)     Frequency , incontinence  2. ONSET: "When did the  sx   start?"     2- 3 weeks ago  3. PAIN: "Is there any pain?" If Yes, ask: "How bad is it?" (Scale: 1-10; mild, moderate, severe)     na 4. CAUSE: "What do you think is causing the symptoms?"     Not sure UTI 5. OTHER SYMPTOMS: "Do you have any other symptoms?" (e.g., blood in urine, fever, flank pain, pain with urination)     Frequency, esp at night, not able to rest at night . Saturating pull ups when standing. Incontinent. Strong odor , no burning no pain  6. PREGNANCY: "Is there any chance you are pregnant?" "When was your last menstrual period?"     na  Protocols used: Urinary Symptoms-A-AH

## 2023-08-06 ENCOUNTER — Ambulatory Visit: Payer: No Typology Code available for payment source | Admitting: Physician Assistant

## 2023-08-29 DIAGNOSIS — M1712 Unilateral primary osteoarthritis, left knee: Secondary | ICD-10-CM | POA: Diagnosis not present

## 2023-08-30 ENCOUNTER — Telehealth: Payer: Self-pay | Admitting: Internal Medicine

## 2023-08-30 ENCOUNTER — Telehealth: Payer: Self-pay

## 2023-08-30 NOTE — Telephone Encounter (Signed)
   Name: Summer [name]  DOB: 1951-04-23  MRN: [id number]  Primary Cardiologist: Yvonne Kendall, MD   Preoperative team, please contact this patient and set up a phone call appointment for further preoperative risk assessment. Please obtain consent and complete medication review. Thank you for your help.  I confirm that guidance regarding antiplatelet and oral anticoagulation therapy has been completed and, if necessary, noted below.  None Requested  I also confirmed the patient resides in the state of [place]. As per [facility] Medical Board telemedicine laws, the patient must reside in the state in which the provider is licensed.   Napoleon Form, Leodis Rains, NP 08/30/2023, 1:12 PM [facility]

## 2023-08-30 NOTE — Telephone Encounter (Signed)
  Patient Consent for Virtual Visit        DORETHEA [name] has provided verbal consent on 08/30/2023 for a virtual visit (video or telephone).   CONSENT FOR VIRTUAL VISIT FOR:  Summer [name]  By participating in this virtual visit I agree to the following:  I hereby voluntarily request, consent and authorize [facility] and its employed or contracted physicians, physician assistants, nurse practitioners or other licensed health care professionals (the Practitioner), to provide me with telemedicine health care services (the "Services") as deemed necessary by the treating Practitioner. I acknowledge and consent to receive the Services by the Practitioner via telemedicine. I understand that the telemedicine visit will involve communicating with the Practitioner through live audiovisual communication technology and the disclosure of certain medical information by electronic transmission. I acknowledge that I have been given the opportunity to request an in-person assessment or other available alternative prior to the telemedicine visit and am voluntarily participating in the telemedicine visit.  I understand that I have the right to withhold or withdraw my consent to the use of telemedicine in the course of my care at any time, without affecting my right to future care or treatment, and that the Practitioner or I may terminate the telemedicine visit at any time. I understand that I have the right to inspect all information obtained and/or recorded in the course of the telemedicine visit and may receive copies of available information for a reasonable fee.  I understand that some of the potential risks of receiving the Services via telemedicine include:  Delay or interruption in medical evaluation due to technological equipment failure or disruption; Information transmitted may not be sufficient (e.g. poor resolution of images) to allow for appropriate medical decision making by the Practitioner;  and/or  In rare instances, security protocols could fail, causing a breach of personal health information.  Furthermore, I acknowledge that it is my responsibility to provide information about my medical history, conditions and care that is complete and accurate to the best of my ability. I acknowledge that Practitioner's advice, recommendations, and/or decision may be based on factors not within their control, such as incomplete or inaccurate data provided by me or distortions of diagnostic images or specimens that may result from electronic transmissions. I understand that the practice of medicine is not an exact science and that Practitioner makes no warranties or guarantees regarding treatment outcomes. I acknowledge that a copy of this consent can be made available to me via my patient portal [facility] MyChart), or I can request a printed copy by calling the office of [facility].    I understand that my insurance will be billed for this visit.   I have read or had this consent read to me. I understand the contents of this consent, which adequately explains the benefits and risks of the Services being provided via telemedicine.  I have been provided ample opportunity to ask questions regarding this consent and the Services and have had my questions answered to my satisfaction. I give my informed consent for the services to be provided through the use of telemedicine in my medical care

## 2023-08-30 NOTE — Telephone Encounter (Signed)
Spoke with patient who is agreeable to do a tele visit on 1/9 at 2 pm. Consent given, med rec needs to be completed.

## 2023-08-30 NOTE — Telephone Encounter (Signed)
   Pre-operative Risk Assessment    Patient Name: Summer [name]  DOB: 10/31/1950 MRN: [id number]      Request for Surgical Clearance    Procedure:   Left TKA  Date of Surgery:  Clearance TBD                                 Surgeon:  Dr Janace Litten Group or Practice Name:  [facility] Phone number:  [phone] Fax number:  [phone]   Type of Clearance Requested:   - Medical    Type of Anesthesia:  Not Indicated   Additional requests/questions:    Courtney Heys   08/30/2023, 1:07 PM

## 2023-09-07 ENCOUNTER — Ambulatory Visit (INDEPENDENT_AMBULATORY_CARE_PROVIDER_SITE_OTHER): Payer: No Typology Code available for payment source

## 2023-09-07 ENCOUNTER — Telehealth: Payer: Self-pay

## 2023-09-07 DIAGNOSIS — M2042 Other hammer toe(s) (acquired), left foot: Secondary | ICD-10-CM | POA: Diagnosis not present

## 2023-09-07 DIAGNOSIS — M2141 Flat foot [pes planus] (acquired), right foot: Secondary | ICD-10-CM

## 2023-09-07 DIAGNOSIS — L97522 Non-pressure chronic ulcer of other part of left foot with fat layer exposed: Secondary | ICD-10-CM | POA: Diagnosis not present

## 2023-09-07 DIAGNOSIS — M2041 Other hammer toe(s) (acquired), right foot: Secondary | ICD-10-CM | POA: Diagnosis not present

## 2023-09-07 DIAGNOSIS — M2142 Flat foot [pes planus] (acquired), left foot: Secondary | ICD-10-CM | POA: Diagnosis not present

## 2023-09-07 DIAGNOSIS — E0843 Diabetes mellitus due to underlying condition with diabetic autonomic (poly)neuropathy: Secondary | ICD-10-CM

## 2023-09-07 NOTE — Telephone Encounter (Signed)
Shoes and inserts are in BTON / Appt 12/27

## 2023-09-10 DIAGNOSIS — F331 Major depressive disorder, recurrent, moderate: Secondary | ICD-10-CM | POA: Diagnosis not present

## 2023-09-10 DIAGNOSIS — Z6841 Body Mass Index (BMI) 40.0 and over, adult: Secondary | ICD-10-CM | POA: Diagnosis not present

## 2023-09-10 DIAGNOSIS — N2581 Secondary hyperparathyroidism of renal origin: Secondary | ICD-10-CM | POA: Diagnosis not present

## 2023-09-10 DIAGNOSIS — R2681 Unsteadiness on feet: Secondary | ICD-10-CM | POA: Diagnosis not present

## 2023-09-10 DIAGNOSIS — Z89412 Acquired absence of left great toe: Secondary | ICD-10-CM | POA: Diagnosis not present

## 2023-09-10 DIAGNOSIS — Z008 Encounter for other general examination: Secondary | ICD-10-CM | POA: Diagnosis not present

## 2023-09-10 DIAGNOSIS — Z89422 Acquired absence of other left toe(s): Secondary | ICD-10-CM | POA: Diagnosis not present

## 2023-09-20 ENCOUNTER — Ambulatory Visit: Payer: No Typology Code available for payment source | Attending: Cardiology | Admitting: Student

## 2023-09-20 DIAGNOSIS — Z01810 Encounter for preprocedural cardiovascular examination: Secondary | ICD-10-CM | POA: Diagnosis not present

## 2023-09-20 NOTE — Progress Notes (Signed)
 Virtual Visit via Telephone Note   Because of Summer [name]'s co-morbid illnesses, she is at least at moderate risk for complications without adequate follow up.  This format is felt to be most appropriate for this patient at this time.  The patient did not have access to video technology/had technical difficulties with video requiring transitioning to audio format only (telephone).  All issues noted in this document were discussed and addressed.  No physical exam could be performed with this format.  Please refer to the patient's chart for her consent to telehealth for [facility].  Evaluation Performed:  Preoperative cardiovascular risk assessment _____________   Date:  09/20/2023   Patient ID:  Summer [name], DOB 1951-05-07, MRN [id number] Patient Location:  Home Provider location:   Office  Primary Care Provider:  Sowles, Krichna, MD Primary Cardiologist:  [name], MD  Chief Complaint / Patient Profile   73 y.o. y/o female with a h/o CAD, chronic HFpEF, PAD, first-degree AV block, hypertension, hyperlipidemia, OSA, COPD, GERD, T2DM, CKD stage IIIa who is pending left total knee arthroplasty by Dr. Lorelle and presents today for telephonic preoperative cardiovascular risk assessment.  History of Present Illness    Summer [name] is a 73 y.o. female who presents via audio/video conferencing for a telehealth visit today.  Pt was last seen in cardiology clinic on 03/16/2023 by Dr. Hanson.  At that time Summer [name] was stable from a cardiac standpoint.  The patient is now pending procedure as outlined above. Since her last visit, she is doing well. Patient denies shortness of breath, dyspnea on exertion, lower extremity edema, orthopnea or PND. She does get some chronic edema in left foot that is stable.  No chest pain, pressure, or tightness. No palpitations. Her activity has been limited, as she had some toes amputated. She is independent with ADLs and able to perform light to  moderate household activities.   Past Medical History    Past Medical History:  Diagnosis Date   Anemia    Arthritis    Asthma    B12 deficiency    Back pain    Carpal tunnel syndrome    Chronic kidney disease    Constipation    Depression    Depressive disorder    Diabetes mellitus    Dyspnea    Fluid retention    Foot pain    GERD (gastroesophageal reflux disease)    Headache    History of hiatal hernia    Hyperlipidemia    Hypertension    IBS (irritable bowel syndrome)    Insomnia    Joint pain    Lumbago    Memory loss    Obesity    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)    Pneumonia    Rhinitis, allergic    Rosacea    Sleep apnea    SOB (shortness of breath)    Thyroid  disease    Unspecified hearing loss    Unspecified hereditary and idiopathic peripheral neuropathy    Unspecified sleep apnea    Vitamin D  deficiency    Past Surgical History:  Procedure Laterality Date   ABDOMINAL HYSTERECTOMY  1975   AMPUTATION TOE Left 05/02/2023   Procedure: LEFT FOOT PARTIAL AMPUTATION 1st & 2nd TOE;  Surgeon: Silva Juliene SAUNDERS, DPM;  Location: [facility] ORS;  Service: Orthopedics/Podiatry;  Laterality: Left;   ANKLE SURGERY Left approx Jan 2018   APPENDECTOMY  1970   CATARACT EXTRACTION  01/2011   right  COLONOSCOPY WITH PROPOFOL  N/A 08/19/2019   Procedure: COLONOSCOPY WITH PROPOFOL ;  Surgeon: Therisa Bi, MD;  Location: [facility] ENDOSCOPY;  Service: Gastroenterology;  Laterality: N/A;   eye lid surgery  2013   bilateral   FOOT SURGERY     LOWER EXTREMITY ANGIOGRAPHY Left 11/23/2020   Procedure: LOWER EXTREMITY ANGIOGRAPHY;  Surgeon: Jama Cordella MATSU, MD;  Location: [facility] INVASIVE CV LAB;  Service: Cardiovascular;  Laterality: Left;   NECK SURGERY     SPINE SURGERY     TEE WITHOUT CARDIOVERSION N/A 08/16/2022   Procedure: TRANSESOPHAGEAL ECHOCARDIOGRAM (TEE);  Surgeon: Darliss Rogue, MD;  Location: [facility] ORS;  Service: Cardiovascular;  Laterality: N/A;   TUBAL LIGATION      VAGINAL HYSTERECTOMY  1989    Allergies  Allergies  Allergen Reactions   Codeine Other (See Comments)    TRIPPED OUT  DIDN'T LIKE THE WAY IT FELT   Atorvastatin     muscle pain   Hydrocodone      itching   Tramadol     Latex Rash   Zolpidem  Other (See Comments)    Sleep walk    Home Medications    Prior to Admission medications   Medication Sig Start Date End Date Taking? Authorizing Provider  acetaminophen  (TYLENOL  8 HOUR) 650 MG CR tablet Take 1 tablet (650 mg total) by mouth every 8 (eight) hours as needed for pain. 10/16/18   Sowles, Krichna, MD  amLODipine  (NORVASC ) 5 MG tablet Take 1 tablet (5 mg total) by mouth daily. 06/18/23   Sowles, Krichna, MD  ARIPiprazole  (ABILIFY ) 2 MG tablet Take 1 tablet (2 mg total) by mouth every evening. 06/18/23   Sowles, Krichna, MD  ASPIRIN  LOW DOSE 81 MG EC tablet TAKE 1 TABLET (81 MG TOTAL) BY MOUTH DAILY. 05/31/16   Sowles, Krichna, MD  B-D ULTRAFINE III SHORT PEN 31G X 8 MM MISC  01/25/23   [provider]  buPROPion  (WELLBUTRIN  XL) 150 MG 24 hr tablet Take 1 tablet (150 mg total) by mouth daily. 06/18/23   Sowles, Krichna, MD  Cholecalciferol  (VITAMIN D3) 25 MCG (1000 UT) CAPS Take 1,000 Units by mouth daily.    [provider]  Continuous Blood Gluc Sensor (FREESTYLE LIBRE 2 SENSOR) MISC FOR GLUCOSE MONITORING 06/27/22   [provider]  cyanocobalamin  (VITAMIN B12) 500 MCG tablet Take 500 mcg by mouth daily.    [provider]  diclofenac  Sodium (VOLTAREN ) 1 % GEL Apply 2 g topically 2 (two) times daily.    [provider]  ezetimibe  (ZETIA ) 10 MG tablet Take 1 tablet (10 mg total) by mouth daily. 06/18/23   Sowles, Krichna, MD  FARXIGA  10 MG TABS tablet Take 10 mg by mouth every morning. 07/12/23   [provider]  feeding supplement (ENSURE ENLIVE / ENSURE PLUS) LIQD Take 237 mLs by mouth 3 (three) times daily. 08/28/22   Gonfa, Taye T, MD  furosemide  (LASIX ) 20 MG tablet Take 1 tablet  (20 mg total) by mouth daily. 06/18/23   Sowles, Krichna, MD  metoprolol  tartrate (LOPRESSOR ) 50 MG tablet Take 0.5 tablets (25 mg total) by mouth 2 (two) times daily. 06/18/23   Sowles, Krichna, MD  Misc Natural Products (OSTEO BI-FLEX ADV JOINT SHIELD PO) Take by mouth 2 (two) times daily.    [provider]  omeprazole  (PRILOSEC) 40 MG capsule Take 1 capsule (40 mg total) by mouth daily. 06/18/23   Sowles, Krichna, MD  pregabalin  (LYRICA ) 150 MG capsule Take 1 capsule (150 mg total) by  mouth 2 (two) times daily. 06/18/23 07/18/23  Sowles, Krichna, MD  Probiotic Product (PROBIOTIC ADVANCED PO) Take 1 tablet by mouth daily.    [provider]  rosuvastatin  (CRESTOR ) 40 MG tablet Take 1 tablet (40 mg total) by mouth daily. 06/18/23   Sowles, Krichna, MD  sertraline  (ZOLOFT ) 100 MG tablet Take 1.5 tablets (150 mg total) by mouth daily. 06/18/23   Sowles, Krichna, MD  traZODone  (DESYREL ) 100 MG tablet Take 1 tablet (100 mg total) by mouth at bedtime as needed for sleep. 06/18/23   Sowles, Krichna, MD  TRESIBA FLEXTOUCH 200 UNIT/ML FlexTouch Pen SMARTSIG:60 Unit(s) SUB-Q Daily 01/25/23   [provider]  VASCEPA  1 g capsule Take 2 capsules (2 g total) by mouth 2 (two) times daily. 06/18/23   Sowles, Krichna, MD    Physical Exam    Vital Signs:  Summer [name] does not have vital signs available for review today.  Given telephonic nature of communication, physical exam is limited. AAOx3. NAD. Normal affect.  Speech and respirations are unlabored.   Assessment & Plan    Primary Cardiologist: [name], MD  Preoperative cardiovascular risk assessment.  Left total knee arthroplasty by Dr. Lorelle  Chart reviewed as part of pre-operative protocol coverage. According to the RCRI, patient has a 0.9% risk of MACE. Patient reports activity equivalent to 4.0 METS (independent with ADLs, able to perform light to moderate household activities).   Given past medical history and time  since last visit, based on ACC/AHA guidelines, Summer [name] would be at acceptable risk for the planned procedure without further cardiovascular testing.   Patient was advised that if she develops new symptoms prior to surgery to contact our office to arrange a follow-up appointment.  she verbalized understanding.  I will route this recommendation to the requesting party via Epic fax function.  Please call with questions.  Time:   Today, I have spent 5 minutes with the patient with telehealth technology discussing medical history, symptoms, and management plan.     Barnie Hila, NP  09/20/2023, 8:42 AM

## 2023-10-10 ENCOUNTER — Other Ambulatory Visit: Payer: Self-pay | Admitting: Orthopedic Surgery

## 2023-10-15 ENCOUNTER — Telehealth: Payer: Self-pay | Admitting: Internal Medicine

## 2023-10-15 NOTE — Telephone Encounter (Signed)
Faxed over phone note clearing pt to [phone]

## 2023-10-15 NOTE — Telephone Encounter (Signed)
   Pre-operative Risk Assessment    Patient Name: Summer [name]  DOB: [birth year] MRN: [id number]   Date of last office visit: 03/16/23 Date of next office visit: n/a   Request for Surgical Clearance    Procedure: Left TKA   Date of Surgery:  Clearance 11/01/23                                Surgeon:  Dr. Janace Litten Group or Practice Name:  [facility] Phone number:  [phone] Fax number:  [phone]   Type of Clearance Requested:  Medical    Type of Anesthesia:  Not Indicated   Additional requests/questions:    Signed, Narda Amber   10/15/2023, 10:38 AM

## 2023-10-15 NOTE — Telephone Encounter (Signed)
Preoperative team, this is a duplicate request.  Please resend Carlos Levering NP-C's note from phone appointment to requesting office.  Thank you for your help.  Thomasene Ripple. Lynise Porr NP-C     10/15/2023, 12:59 PM [facility] [address] Office [phone] Fax [phone]

## 2023-10-17 DIAGNOSIS — M1712 Unilateral primary osteoarthritis, left knee: Secondary | ICD-10-CM | POA: Diagnosis not present

## 2023-10-22 ENCOUNTER — Ambulatory Visit: Payer: No Typology Code available for payment source | Admitting: Family Medicine

## 2023-10-23 ENCOUNTER — Encounter: Payer: Self-pay | Admitting: Family Medicine

## 2023-10-23 ENCOUNTER — Ambulatory Visit (INDEPENDENT_AMBULATORY_CARE_PROVIDER_SITE_OTHER): Payer: No Typology Code available for payment source | Admitting: Family Medicine

## 2023-10-23 VITALS — BP 134/80 | HR 74 | Resp 16 | Ht 62.0 in | Wt 229.0 lb

## 2023-10-23 DIAGNOSIS — E1169 Type 2 diabetes mellitus with other specified complication: Secondary | ICD-10-CM | POA: Diagnosis not present

## 2023-10-23 DIAGNOSIS — Z01818 Encounter for other preprocedural examination: Secondary | ICD-10-CM | POA: Diagnosis not present

## 2023-10-23 DIAGNOSIS — E785 Hyperlipidemia, unspecified: Secondary | ICD-10-CM

## 2023-10-23 DIAGNOSIS — N1832 Chronic kidney disease, stage 3b: Secondary | ICD-10-CM | POA: Diagnosis not present

## 2023-10-23 DIAGNOSIS — E039 Hypothyroidism, unspecified: Secondary | ICD-10-CM

## 2023-10-23 DIAGNOSIS — I5032 Chronic diastolic (congestive) heart failure: Secondary | ICD-10-CM

## 2023-10-23 NOTE — Progress Notes (Unsigned)
Name: Summer [name]   MRN: [id number]    DOB: 07-24-1951   Date:10/23/2023       Progress Note  Subjective  Chief Complaint  Chief Complaint  Patient presents with   Pre-op Exam   HPI   Patient is here today per request of Dr. Audelia Acton for pre op clearance. She has a date of Feb 20 th for left total knee arthroplasty.   She has multiple medication problems including DM, CKI, CAD, CHF, CAD, she has a history of OSA but not using it in over 10 years - cannot tolerate it . Last visit with cardiologist was July 2024 and was supposed to go back for repeat EKG in 3 months. She is up to date with visits with pulmonologist. She has chronic SOB with mild activity  that is likely multifactorial. She will see Endo March 5 th but surgery is scheduled for FEb 20 th and we will check A1C today. She states glucose has been high lately - sometimes too high to read. Explained A1C has to be below 8 % to be able to have surgery. She states not very compliant with her diet lately. We will also recheck renal function and anemia  She had surgeries in the past - last one in 2024 for toe amputation and tolerated it well.   Hold aspirin for 7 days prior to surgery    Patient Active Problem List   Diagnosis Date Noted   CKD stage 3a, GFR 45-59 ml/min ([name]) 05/01/2023   Small vessel disease ([name]) 03/27/2023   Coronary artery disease involving native coronary artery of native heart without angina pectoris 03/16/2023   First degree AV block 03/16/2023   Posterior reversible encephalopathy syndrome 08/21/2022   Seizure ([name]) 08/20/2022   Multiple falls 08/15/2022   Gouty arthritis 08/15/2022   Diabetic ulcer of left midfoot associated with diabetes mellitus due to underlying condition, limited to breakdown of skin ([name]) 08/15/2022   Metabolic acidosis 08/11/2022   Ulcer of left foot due to type 2 diabetes mellitus ([name]) 08/10/2022   Atherosclerosis of aorta ([name]) 07/04/2022   Insomnia due to other mental disorder  07/04/2022   Amputated toe of left foot ([name]) 07/04/2022   Chronic obstructive pulmonary disease ([name]) 01/28/2021   PAD (peripheral artery disease) ([name]) 12/03/2020   B12 deficiency 05/24/2020   Vitamin D deficiency 05/24/2020   Metatarsalgia of left foot 01/15/2020   MDD (major depressive disorder), recurrent episode, moderate ([name]) 04/04/2019   (HFpEF) heart failure with preserved ejection fraction ([name]) 08/31/2017   Moderate persistent asthma 08/31/2017   Hyperlipidemia associated with type 2 diabetes mellitus ([name]) 06/14/2017   Charcot foot due to diabetes mellitus ([name]) 02/22/2016   Acquired abduction deformity of foot 07/12/2015   Osteoarthritis of subtalar joint 07/12/2015   Carpal tunnel syndrome 04/17/2015   Chronic constipation 04/17/2015   Stage 3b chronic kidney disease ([name]) 04/17/2015   Decreased exercise tolerance 04/17/2015   Diabetes mellitus with polyneuropathy ([name]) 04/17/2015   Gastroesophageal reflux disease without esophagitis 04/17/2015   Bursitis, trochanteric 04/17/2015   Cephalalgia 04/17/2015   Essential hypertension 04/17/2015   Adult hypothyroidism 04/17/2015   Hearing loss 04/17/2015   Chronic recurrent major depressive disorder ([name]) 04/17/2015   Neurogenic claudication 04/17/2015   Morbid obesity with BMI of 40.0-44.9, adult ([name]) 04/17/2015   Hypo-ovarianism 04/17/2015   Perennial allergic rhinitis with seasonal variation 04/17/2015   Acne erythematosa 04/17/2015   Dyskinesia, tardive 04/17/2015   Memory loss 04/17/2015   Impingement syndrome of  shoulder 04/17/2015   Dermatitis, stasis 04/17/2015   Obstructive sleep apnea 05/14/2014   Shortness of breath on exertion 05/06/2014   Mixed hyperlipidemia 02/06/2012   Low back pain 09/16/2008    Past Surgical History:  Procedure Laterality Date   ABDOMINAL HYSTERECTOMY  1975   AMPUTATION TOE Left 05/02/2023   Procedure: LEFT FOOT PARTIAL AMPUTATION 1st & 2nd TOE;  Surgeon: Edwin Cap, DPM;   Location: [facility] ORS;  Service: Orthopedics/Podiatry;  Laterality: Left;   ANKLE SURGERY Left approx Jan 2018   APPENDECTOMY  1970   CATARACT EXTRACTION  01/2011   right   COLONOSCOPY WITH PROPOFOL N/A 08/19/2019   Procedure: COLONOSCOPY WITH PROPOFOL;  Surgeon: Wyline Mood, MD;  Location: [facility] ENDOSCOPY;  Service: Gastroenterology;  Laterality: N/A;   eye lid surgery  2013   bilateral   FOOT SURGERY     LOWER EXTREMITY ANGIOGRAPHY Left 11/23/2020   Procedure: LOWER EXTREMITY ANGIOGRAPHY;  Surgeon: Renford Dills, MD;  Location: [facility] INVASIVE CV LAB;  Service: Cardiovascular;  Laterality: Left;   NECK SURGERY     SPINE SURGERY     TEE WITHOUT CARDIOVERSION N/A 08/16/2022   Procedure: TRANSESOPHAGEAL ECHOCARDIOGRAM (TEE);  Surgeon: Debbe Odea, MD;  Location: [facility] ORS;  Service: Cardiovascular;  Laterality: N/A;   TUBAL LIGATION     VAGINAL HYSTERECTOMY  1989    Family History  Problem Relation Age of Onset   Aneurysm Mother    Aortic aneurysm Mother    Hypertension Mother    Hyperlipidemia Mother    Heart disease Mother    Obesity Mother    Heart attack Maternal Grandfather    Diabetes Maternal Grandfather     Social History   Tobacco Use   Smoking status: Never   Smokeless tobacco: Never  Substance Use Topics   Alcohol use: No    Alcohol/week: 0.0 standard drinks of alcohol     Current Outpatient Medications:    acetaminophen (TYLENOL 8 HOUR) 650 MG CR tablet, Take 1 tablet (650 mg total) by mouth every 8 (eight) hours as needed for pain., Disp: 90 tablet, Rfl: 2   amLODipine (NORVASC) 5 MG tablet, Take 1 tablet (5 mg total) by mouth daily., Disp: 30 tablet, Rfl: 5   ARIPiprazole (ABILIFY) 2 MG tablet, Take 1 tablet (2 mg total) by mouth every evening., Disp: 30 tablet, Rfl: 5   ASPIRIN LOW DOSE 81 MG EC tablet, TAKE 1 TABLET (81 MG TOTAL) BY MOUTH DAILY., Disp: 30 tablet, Rfl: 0   B-D ULTRAFINE III SHORT PEN 31G X 8 MM MISC, , Disp: , Rfl:    buPROPion  (WELLBUTRIN XL) 150 MG 24 hr tablet, Take 1 tablet (150 mg total) by mouth daily., Disp: 30 tablet, Rfl: 5   Cholecalciferol (VITAMIN D3) 25 MCG (1000 UT) CAPS, Take 1,000 Units by mouth daily., Disp: , Rfl:    Continuous Blood Gluc Sensor (FREESTYLE LIBRE 2 SENSOR) MISC, FOR GLUCOSE MONITORING, Disp: , Rfl:    cyanocobalamin (VITAMIN B12) 500 MCG tablet, Take 500 mcg by mouth daily., Disp: , Rfl:    diclofenac Sodium (VOLTAREN) 1 % GEL, Apply 2 g topically 2 (two) times daily., Disp: , Rfl:    diphenhydrAMINE (BENADRYL) 25 mg capsule, Take 25 mg by mouth every 6 (six) hours as needed., Disp: , Rfl:    ezetimibe (ZETIA) 10 MG tablet, Take 1 tablet (10 mg total) by mouth daily., Disp: 30 tablet, Rfl: 5   FARXIGA 10 MG TABS tablet, Take 10 mg  by mouth every morning., Disp: , Rfl:    feeding supplement (ENSURE ENLIVE / ENSURE PLUS) LIQD, Take 237 mLs by mouth 3 (three) times daily., Disp: 237 mL, Rfl: 12   furosemide (LASIX) 20 MG tablet, Take 1 tablet (20 mg total) by mouth daily., Disp: 30 tablet, Rfl: 5   metoprolol tartrate (LOPRESSOR) 50 MG tablet, Take 0.5 tablets (25 mg total) by mouth 2 (two) times daily., Disp: 30 tablet, Rfl: 5   Misc Natural Products (OSTEO BI-FLEX ADV JOINT SHIELD PO), Take by mouth 2 (two) times daily., Disp: , Rfl:    omeprazole (PRILOSEC) 40 MG capsule, Take 1 capsule (40 mg total) by mouth daily., Disp: 30 capsule, Rfl: 5   Probiotic Product (PROBIOTIC ADVANCED PO), Take 1 tablet by mouth daily., Disp: , Rfl:    rosuvastatin (CRESTOR) 40 MG tablet, Take 1 tablet (40 mg total) by mouth daily., Disp: 30 tablet, Rfl: 5   sertraline (ZOLOFT) 100 MG tablet, Take 1.5 tablets (150 mg total) by mouth daily., Disp: 45 tablet, Rfl: 5   traZODone (DESYREL) 100 MG tablet, Take 1 tablet (100 mg total) by mouth at bedtime as needed for sleep., Disp: 30 tablet, Rfl: 5   TRESIBA FLEXTOUCH 200 UNIT/ML FlexTouch Pen, SMARTSIG:60 Unit(s) SUB-Q Daily, Disp: , Rfl:    VASCEPA 1 g capsule,  Take 2 capsules (2 g total) by mouth 2 (two) times daily., Disp: 120 capsule, Rfl: 5   pregabalin (LYRICA) 150 MG capsule, Take 1 capsule (150 mg total) by mouth 2 (two) times daily., Disp: 60 capsule, Rfl: 0  Allergies  Allergen Reactions   Codeine Other (See Comments)    "TRIPPED OUT"  DIDN'T LIKE THE WAY IT FELT   Atorvastatin     muscle pain   Hydrocodone     itching   Tramadol    Latex Rash   Zolpidem Other (See Comments)    Sleep walk    I personally reviewed active problem list, medication list, allergies with the patient/caregiver today.   ROS  Ten systems reviewed and is negative except as mentioned in HPI    Objective  Vitals:   10/23/23 1342  BP: (!) 148/86  Pulse: 74  Resp: 16  SpO2: 98%  Weight: 229 lb (103.9 kg)  Height: 5\' 2"  (1.575 m)    Body mass index is 41.88 kg/m.  Physical Exam  Constitutional: Patient appears well-developed and well-nourished. Obese  No distress.  HEENT: head atraumatic, normocephalic, pupils equal and reactive to light, neck supple Cardiovascular: Normal rate, regular rhythm and normal heart sounds.  No murmur heard. No BLE edema. Pulmonary/Chest: Effort normal and breath sounds normal. No respiratory distress. Abdominal: Soft.  There is no tenderness. Psychiatric: Patient has a normal mood and affect. behavior is normal. Judgment and thought content normal.    Diabetic Foot Exam: {Perform Simple Foot Exam  Perform Detailed exam:1} {Insert foot Exam (Optional):30965}   PHQ2/9:    10/23/2023    1:34 PM 07/19/2023   11:25 AM 06/18/2023    3:39 PM 05/17/2023   10:57 AM 05/01/2023   11:31 AM  Depression screen PHQ 2/9  Decreased Interest 3 0 0 0 0  Down, Depressed, Hopeless 3 0 0 0 0  PHQ - 2 Score 6 0 0 0 0  Altered sleeping 3 0 0 0 0  Tired, decreased energy 3 0 0 0 3  Change in appetite 3 0 0 0 3  Feeling bad or failure about yourself  0 0 0  0 0  Trouble concentrating 3 0 0 0 0  Moving slowly or fidgety/restless 0  0 0 0 0  Suicidal thoughts 0 0 0 0 0  PHQ-9 Score 18 0 0 0 6  Difficult doing work/chores Very difficult   Not difficult at all Somewhat difficult    phq 9 is positive  Fall Risk:    10/23/2023    1:33 PM 07/19/2023   11:25 AM 06/18/2023    3:39 PM 05/17/2023   10:56 AM 05/01/2023   11:30 AM  Fall Risk   Falls in the past year? 0 1 1 1  0  Number falls in past yr: 0 1 1 1    Injury with Fall? 0 1 1 1    Risk for fall due to : No Fall Risks History of fall(s);Impaired balance/gait Impaired balance/gait Impaired balance/gait Impaired balance/gait  Follow up Falls prevention discussed;Education provided;Falls evaluation completed Falls prevention discussed;Education provided;Falls evaluation completed Falls prevention discussed Falls prevention discussed;Education provided;Falls evaluation completed Falls prevention discussed;Education provided;Falls evaluation completed     Assessment & Plan  1. Pre-op evaluation (Primary)  Hold until lab results are back Needs to see cardiologist for cardiac pre op   2. Dyslipidemia associated with type 2 diabetes mellitus ([name])  - Hemoglobin A1c  3. Stage 3b chronic kidney disease ([name])  - CBC with Differential/Platelet - COMPLETE METABOLIC PANEL WITH GFR  4. Chronic heart failure with preserved ejection fraction ([name])  - CBC with Differential/Platelet - COMPLETE METABOLIC PANEL WITH GFR  5. Adult hypothyroidism  - TSH

## 2023-10-24 ENCOUNTER — Inpatient Hospital Stay
Admission: RE | Admit: 2023-10-24 | Discharge: 2023-10-24 | Disposition: A | Discharge status: Home / Self Care | Payer: No Typology Code available for payment source | Source: Ambulatory Visit

## 2023-10-24 ENCOUNTER — Encounter: Payer: Self-pay | Admitting: Family Medicine

## 2023-10-24 ENCOUNTER — Encounter: Payer: Self-pay | Admitting: Urgent Care

## 2023-10-24 LAB — CBC WITH DIFFERENTIAL/PLATELET
Absolute Lymphocytes: 2020 {cells}/uL (ref 850–3900)
Absolute Monocytes: 743 {cells}/uL (ref 200–950)
Basophils Absolute: 89 {cells}/uL (ref 0–200)
Basophils Relative: 0.9 %
Eosinophils Absolute: 406 {cells}/uL (ref 15–500)
Eosinophils Relative: 4.1 %
HCT: 40.9 % (ref 35.0–45.0)
Hemoglobin: 13.1 g/dL (ref 11.7–15.5)
MCH: 28.2 pg (ref 27.0–33.0)
MCHC: 32 g/dL (ref 32.0–36.0)
MCV: 88 fL (ref 80.0–100.0)
MPV: 11.8 fL (ref 7.5–12.5)
Monocytes Relative: 7.5 %
Neutro Abs: 6643 {cells}/uL (ref 1500–7800)
Neutrophils Relative %: 67.1 %
Platelets: 235 10*3/uL (ref 140–400)
RBC: 4.65 10*6/uL (ref 3.80–5.10)
RDW: 14.8 % (ref 11.0–15.0)
Total Lymphocyte: 20.4 %
WBC: 9.9 10*3/uL (ref 3.8–10.8)

## 2023-10-24 LAB — COMPLETE METABOLIC PANEL WITH GFR
AG Ratio: 2.2 (calc) (ref 1.0–2.5)
ALT: 13 U/L (ref 6–29)
AST: 13 U/L (ref 10–35)
Albumin: 4.6 g/dL (ref 3.6–5.1)
Alkaline phosphatase (APISO): 92 U/L (ref 37–153)
BUN/Creatinine Ratio: 22 (calc) (ref 6–22)
BUN: 36 mg/dL — ABNORMAL HIGH (ref 7–25)
CO2: 26 mmol/L (ref 20–32)
Calcium: 10.1 mg/dL (ref 8.6–10.4)
Chloride: 97 mmol/L — ABNORMAL LOW (ref 98–110)
Creat: 1.64 mg/dL — ABNORMAL HIGH (ref 0.60–1.00)
Globulin: 2.1 g/dL (ref 1.9–3.7)
Glucose, Bld: 421 mg/dL — ABNORMAL HIGH (ref 65–99)
Potassium: 4.9 mmol/L (ref 3.5–5.3)
Sodium: 135 mmol/L (ref 135–146)
Total Bilirubin: 0.3 mg/dL (ref 0.2–1.2)
Total Protein: 6.7 g/dL (ref 6.1–8.1)
eGFR: 33 mL/min/{1.73_m2} — ABNORMAL LOW (ref >=60)

## 2023-10-24 LAB — HEMOGLOBIN A1C
Hgb A1c MFr Bld: 12.4 %{Hb} — ABNORMAL HIGH (ref <5.7)
Mean Plasma Glucose: 309 mg/dL
eAG (mmol/L): 17.1 mmol/L

## 2023-10-24 LAB — TSH: TSH: 1.83 m[IU]/L (ref 0.40–4.50)

## 2023-10-24 NOTE — Progress Notes (Signed)
Getting chart ready for pts in person anesthesia interview today. Saw that pt had her medical clearance appt with Dr Carlynn Purl yesterday (10-24-23) for upcoming TKA. A1C was 12.4. Informed Quentin Mulling NP of this and he said Audelia Acton will not do surgery with AIC that high. Called Allyson (Surgery scheduler) to inform her of this. Allyson to reach out to pt to inform her that her surgery will be cancelled and that her appt with PAT is cancelled today as well

## 2023-10-24 NOTE — Progress Notes (Signed)
  Perioperative Services: Pre-Admission/Anesthesia Testing  Abnormal Lab Notification    Date: 10/24/23  Name: QUANTA ROBERTSHAW MRN:   [id number]  Re: Abnormal labs noted during PAT appointment   Provider(s) Notified: Reinaldo Berber, MD (Orthopedics)          Verdis Frederickson, MD (Endocrinology)          Alba Cory, MD (Internal medicine) Notification mode: Routed and/or faxed via [facility]   ABNORMAL LAB VALUE(S): Lab Results  Component Value Date   GLUCOSE 421 (H) 10/23/2023    Notes: Patient with a T2DM diagnosis. She is currently on oral SGLT2i (dapagliflozin) and parenteral basal insulin therapy using insulin degludec. Patient was seen by her PCP yesterday (10/23/2023), and A1c was updated. Result found to be significantly elevated at 12.4%.   In efforts to reduce the risk of developing SSI/PJI, or other potential perioperative complications, this communication is being sent in order to ensure that attending surgeon is aware of high glucose readings recent A1c level. Patient reported to PCP that glucose levels are often too high to register on her glucometer. Of note, she is followed by endocrinology Gershon Crane, MD) and was last seen in 05/2023. Her A1c at that time was 7.6%. Patient is scheduled to follow up with endocrinology on 11/14/2023.  Medicine will not clear patient until better optimized. She was advised that her A1c will need to be </= 8% in order to be cleared. Medicine also requesting that patient be cleared through cardiology service prior to her undergoing surgery. Appreciate input from and overall management by internal medicine. With that said, review of the available documentation indicates that patient has already been cleared for surgery by cardiology on 09/20/2023; updated that clearance stands as of 10/15/2023.  In light of the aforementioned A1c, her diabetes could pose increased risks for the patient during her perioperative course and subsequent recovery period.  With that being said, the benefit of improving glycemic control must be weighed against the overall risks associated with delaying a necessary elective surgical procedure for this patient.   Sending to surgeon for review as attending surgeon continues to develop a surgical plan of care for this patient.   Will also send over to Dr. Gershon Crane (endocrinology) as an Lorain Childes since her readings have been significantly elevated and he will be seeing her relatively soon. May need to expedite appointment with him get a better understanding of what is going on with her diabetes management, especially in the setting of an almost 5 point increase in her A1c since last seeing him in 05/2023.  Quentin Mulling, MSN, APRN, FNP-C, CEN [facility]  Perioperative Services Nurse Practitioner Phone: [phone] Fax: [phone] 10/24/23 9:39 AM

## 2023-10-25 DIAGNOSIS — I152 Hypertension secondary to endocrine disorders: Secondary | ICD-10-CM | POA: Diagnosis not present

## 2023-10-25 DIAGNOSIS — E785 Hyperlipidemia, unspecified: Secondary | ICD-10-CM | POA: Diagnosis not present

## 2023-10-25 DIAGNOSIS — E1169 Type 2 diabetes mellitus with other specified complication: Secondary | ICD-10-CM | POA: Diagnosis not present

## 2023-10-25 DIAGNOSIS — E1122 Type 2 diabetes mellitus with diabetic chronic kidney disease: Secondary | ICD-10-CM | POA: Diagnosis not present

## 2023-10-25 DIAGNOSIS — E1142 Type 2 diabetes mellitus with diabetic polyneuropathy: Secondary | ICD-10-CM | POA: Diagnosis not present

## 2023-10-25 DIAGNOSIS — N1832 Chronic kidney disease, stage 3b: Secondary | ICD-10-CM | POA: Diagnosis not present

## 2023-10-25 DIAGNOSIS — E1159 Type 2 diabetes mellitus with other circulatory complications: Secondary | ICD-10-CM | POA: Diagnosis not present

## 2023-10-25 DIAGNOSIS — D631 Anemia in chronic kidney disease: Secondary | ICD-10-CM | POA: Diagnosis not present

## 2023-10-25 DIAGNOSIS — Z794 Long term (current) use of insulin: Secondary | ICD-10-CM | POA: Diagnosis not present

## 2023-10-25 DIAGNOSIS — I10 Essential (primary) hypertension: Secondary | ICD-10-CM | POA: Diagnosis not present

## 2023-10-25 DIAGNOSIS — N2581 Secondary hyperparathyroidism of renal origin: Secondary | ICD-10-CM | POA: Diagnosis not present

## 2023-10-25 DIAGNOSIS — E039 Hypothyroidism, unspecified: Secondary | ICD-10-CM | POA: Diagnosis not present

## 2023-11-01 ENCOUNTER — Encounter: Admission: RE | Payer: Self-pay | Source: Home / Self Care

## 2023-11-01 ENCOUNTER — Ambulatory Visit
Admission: RE | Admit: 2023-11-01 | Payer: No Typology Code available for payment source | Source: Home / Self Care | Admitting: Orthopedic Surgery

## 2023-11-01 SURGERY — TOTAL KNEE ARTHROPLASTY
Anesthesia: Choice | Site: Knee | Laterality: Left

## 2023-11-15 DIAGNOSIS — E1165 Type 2 diabetes mellitus with hyperglycemia: Secondary | ICD-10-CM | POA: Diagnosis not present

## 2023-11-15 DIAGNOSIS — D8481 Immunodeficiency due to conditions classified elsewhere: Secondary | ICD-10-CM | POA: Diagnosis not present

## 2023-11-15 DIAGNOSIS — Z6841 Body Mass Index (BMI) 40.0 and over, adult: Secondary | ICD-10-CM | POA: Diagnosis not present

## 2023-11-15 DIAGNOSIS — N1832 Chronic kidney disease, stage 3b: Secondary | ICD-10-CM | POA: Diagnosis not present

## 2023-11-15 DIAGNOSIS — E1122 Type 2 diabetes mellitus with diabetic chronic kidney disease: Secondary | ICD-10-CM | POA: Diagnosis not present

## 2023-11-15 DIAGNOSIS — Z794 Long term (current) use of insulin: Secondary | ICD-10-CM | POA: Diagnosis not present

## 2023-11-15 DIAGNOSIS — Z008 Encounter for other general examination: Secondary | ICD-10-CM | POA: Diagnosis not present

## 2023-11-28 DIAGNOSIS — E1159 Type 2 diabetes mellitus with other circulatory complications: Secondary | ICD-10-CM | POA: Diagnosis not present

## 2023-11-28 DIAGNOSIS — E1169 Type 2 diabetes mellitus with other specified complication: Secondary | ICD-10-CM | POA: Diagnosis not present

## 2023-11-28 DIAGNOSIS — E039 Hypothyroidism, unspecified: Secondary | ICD-10-CM | POA: Diagnosis not present

## 2023-11-28 DIAGNOSIS — I152 Hypertension secondary to endocrine disorders: Secondary | ICD-10-CM | POA: Diagnosis not present

## 2023-11-28 DIAGNOSIS — E785 Hyperlipidemia, unspecified: Secondary | ICD-10-CM | POA: Diagnosis not present

## 2023-11-28 DIAGNOSIS — Z794 Long term (current) use of insulin: Secondary | ICD-10-CM | POA: Diagnosis not present

## 2023-11-28 DIAGNOSIS — E1142 Type 2 diabetes mellitus with diabetic polyneuropathy: Secondary | ICD-10-CM | POA: Diagnosis not present

## 2023-12-03 ENCOUNTER — Other Ambulatory Visit: Payer: Self-pay | Admitting: Family Medicine

## 2023-12-03 DIAGNOSIS — E1169 Type 2 diabetes mellitus with other specified complication: Secondary | ICD-10-CM

## 2023-12-03 DIAGNOSIS — F5105 Insomnia due to other mental disorder: Secondary | ICD-10-CM

## 2023-12-03 DIAGNOSIS — I739 Peripheral vascular disease, unspecified: Secondary | ICD-10-CM

## 2023-12-03 DIAGNOSIS — F331 Major depressive disorder, recurrent, moderate: Secondary | ICD-10-CM

## 2023-12-10 ENCOUNTER — Ambulatory Visit (INDEPENDENT_AMBULATORY_CARE_PROVIDER_SITE_OTHER): Payer: No Typology Code available for payment source | Admitting: Internal Medicine

## 2023-12-10 ENCOUNTER — Encounter: Payer: Self-pay | Admitting: Internal Medicine

## 2023-12-10 VITALS — BP 128/76 | HR 70 | Temp 97.7°F | Ht 62.0 in | Wt 230.0 lb

## 2023-12-10 DIAGNOSIS — G4733 Obstructive sleep apnea (adult) (pediatric): Secondary | ICD-10-CM | POA: Diagnosis not present

## 2023-12-10 DIAGNOSIS — J209 Acute bronchitis, unspecified: Secondary | ICD-10-CM

## 2023-12-10 MED ORDER — AZITHROMYCIN 250 MG PO TABS: ORAL_TABLET | ORAL | 0 refills | Status: DC

## 2023-12-10 NOTE — Patient Instructions (Signed)
 Start Z-Pak therapy for acute bronchitis Will reorder flutter valve device use 10-15 times per day  Avoid Allergens and Irritants Avoid secondhand smoke Avoid SICK contacts Recommend  Masking  when appropriate Recommend Keep up-to-date with vaccinations  Recommend weight loss

## 2023-12-10 NOTE — Progress Notes (Signed)
 [facility] Pulmonary Medicine Consultation     **Spirometry 10/31/2018>> tracings personally reviewed.  FVC is 64% predicted, FEV1 is 60% predicted.  Ratio is 70%.  Expiratory time is 6.1 seconds.  Overall this test is consistent with moderate obstructive lung disease. **Echo 07/04/17; EF=70%>> RV systolic function reported as "normal" **08/18/16 CBC>> Eos=438 **05/12/14; Echo>> EF= 55%; Pulm systolic pressure was normal.   CC Follow-up assessment for COPD Follow-up assessment for OSA   HPI 73 year old morbidly obese white female with persistent dyspnea on exertion  Assessment COPD FEV1 60% predicted Previous history of Breztri therapy Patient is currently not on any inhaler therapy  + exacerbation at this time Mild productive cough increased wheezing Patient states that he feels febrile in the number baseline temperatures are in the low range  No evidence of heart failure at this time No respiratory distress No fevers, chills, nausea, vomiting, diarrhea No evidence of lower extremity edema No evidence hemoptysis   Assessment of sleep apnea Patient of does have a history of sleep apnea but is intolerant of CPAP Patient has gained significant amount of weight over the last several years   Patient with a previous history of spinal meningitis and COVID  CXR 07/16/17>>Bibasilar atelectasis.   Medication:    Current Outpatient Medications:    acetaminophen (TYLENOL 8 HOUR) 650 MG CR tablet, Take 1 tablet (650 mg total) by mouth every 8 (eight) hours as needed for pain., Disp: 90 tablet, Rfl: 2   amLODipine (NORVASC) 5 MG tablet, Take 1 tablet (5 mg total) by mouth daily., Disp: 30 tablet, Rfl: 5   ARIPiprazole (ABILIFY) 2 MG tablet, Take 1 tablet (2 mg total) by mouth every evening., Disp: 30 tablet, Rfl: 5   ASPIRIN LOW DOSE 81 MG EC tablet, TAKE 1 TABLET (81 MG TOTAL) BY MOUTH DAILY., Disp: 30 tablet, Rfl: 0   B-D ULTRAFINE III SHORT PEN 31G X 8 MM MISC, , Disp: , Rfl:     buPROPion (WELLBUTRIN XL) 150 MG 24 hr tablet, Take 1 tablet (150 mg total) by mouth daily., Disp: 30 tablet, Rfl: 5   Cholecalciferol (VITAMIN D3) 25 MCG (1000 UT) CAPS, Take 1,000 Units by mouth daily., Disp: , Rfl:    Continuous Blood Gluc Sensor (FREESTYLE LIBRE 2 SENSOR) MISC, FOR GLUCOSE MONITORING, Disp: , Rfl:    cyanocobalamin (VITAMIN B12) 500 MCG tablet, Take 500 mcg by mouth daily., Disp: , Rfl:    diclofenac Sodium (VOLTAREN) 1 % GEL, Apply 2 g topically 2 (two) times daily., Disp: , Rfl:    diphenhydrAMINE (BENADRYL) 25 mg capsule, Take 25 mg by mouth every 6 (six) hours as needed., Disp: , Rfl:    ezetimibe (ZETIA) 10 MG tablet, Take 1 tablet (10 mg total) by mouth daily., Disp: 30 tablet, Rfl: 5   FARXIGA 10 MG TABS tablet, Take 10 mg by mouth every morning., Disp: , Rfl:    feeding supplement (ENSURE ENLIVE / ENSURE PLUS) LIQD, Take 237 mLs by mouth 3 (three) times daily., Disp: 237 mL, Rfl: 12   furosemide (LASIX) 20 MG tablet, Take 1 tablet (20 mg total) by mouth daily., Disp: 30 tablet, Rfl: 5   metoprolol tartrate (LOPRESSOR) 50 MG tablet, Take 0.5 tablets (25 mg total) by mouth 2 (two) times daily., Disp: 30 tablet, Rfl: 5   Misc Natural Products (OSTEO BI-FLEX ADV JOINT SHIELD PO), Take by mouth 2 (two) times daily., Disp: , Rfl:    omeprazole (PRILOSEC) 40 MG capsule, Take 1 capsule (40 mg  total) by mouth daily., Disp: 30 capsule, Rfl: 5   pregabalin (LYRICA) 150 MG capsule, Take 1 capsule (150 mg total) by mouth 2 (two) times daily., Disp: 60 capsule, Rfl: 0   Probiotic Product (PROBIOTIC ADVANCED PO), Take 1 tablet by mouth daily., Disp: , Rfl:    rosuvastatin (CRESTOR) 40 MG tablet, Take 1 tablet (40 mg total) by mouth daily., Disp: 30 tablet, Rfl: 5   sertraline (ZOLOFT) 100 MG tablet, Take 1 and 1/2 tablets by mouth daily., Disp: 45 tablet, Rfl: 0   traZODone (DESYREL) 100 MG tablet, Take 1 tablet by mouth at bedtime as needed for sleep., Disp: 30 tablet, Rfl: 0    TRESIBA FLEXTOUCH 200 UNIT/ML FlexTouch Pen, SMARTSIG:60 Unit(s) SUB-Q Daily, Disp: , Rfl:    VASCEPA 1 g capsule, Take 2 capsules by mouth twice daily., Disp: 120 capsule, Rfl: 0   Allergies:  Codeine, Atorvastatin, Hydrocodone, Tramadol, Latex, and Zolpidem  BP 128/76 (BP Location: Left Arm, Patient Position: Sitting, Cuff Size: Normal)   Pulse 70   Temp 97.7 F (36.5 C) (Temporal)   Ht 5\' 2"  (1.575 m)   Wt 230 lb (104.3 kg)   SpO2 95%   BMI 42.07 kg/m     Review of Systems: Gen:  Denies  fever, sweats, chills weight loss  HEENT: Denies blurred vision, double vision, ear pain, eye pain, hearing loss, nose bleeds, sore throat Cardiac:  No dizziness, chest pain or heaviness, chest tightness,edema, No JVD Resp:   + cough, +sputum production, +shortness of breath,\+wheezing, -hemoptysis,  Other:  All other systems negative   Physical Examination:   General Appearance: No distress  EYES PERRLA, EOM intact.   NECK Supple, No JVD Pulmonary: normal breath sounds, No wheezing.  CardiovascularNormal S1,S2.  No m/r/g.   Abdomen: Benign, Soft, non-tender. Neurology UE/LE 5/5 strength, no focal deficits Ext pulses intact, cap refill intact ALL OTHER ROS ARE NEGATIVE    Assessment and Plan:  73 year old pleasant white female seen today for follow-up moderate COPD and noncompliant OSA in the setting of previous diagnosis of COVID infection with spinal meningitis in the setting of morbid obesity and deconditioned state  Moderate COPD +exacerbation at this time We will prescribe Z-Pak Doing well off of inhalers No maintenance therapy at this time Continue flutter valve Avoid Allergens and Irritants Avoid secondhand smoke Avoid SICK contacts Recommend  Masking  when appropriate Recommend Keep up-to-date with vaccinations  Obesity -recommend significant weight loss -recommend changing diet  Deconditioned state -Recommend increased daily activity and  exercise  GERD Continue PPI as prescribed  OSA Patient is intolerant of CPAP High risk for complications    MEDICATION ADJUSTMENTS/LABS AND TESTS ORDERED: use flutter valve 10 times per day to assist with coughing Z-Pak Recommend significant weight loss Avoid Allergens and Irritants Avoid secondhand smoke Avoid SICK contacts Recommend  Masking  when appropriate Recommend Keep up-to-date with vaccinations   CURRENT MEDICATIONS REVIEWED AT LENGTH WITH PATIENT TODAY   Patient satisfied with Plan of action and management. All questions answered  Follow-up in 6 months  Total time spent 41 minutes   Lucie Leather, M.D.  [facility]  Medical Director [facility] Medical Director [facility] Cardio-Pulmonary Department

## 2023-12-13 DIAGNOSIS — B351 Tinea unguium: Secondary | ICD-10-CM | POA: Diagnosis not present

## 2023-12-13 DIAGNOSIS — E11610 Type 2 diabetes mellitus with diabetic neuropathic arthropathy: Secondary | ICD-10-CM | POA: Diagnosis not present

## 2023-12-13 DIAGNOSIS — Z794 Long term (current) use of insulin: Secondary | ICD-10-CM | POA: Diagnosis not present

## 2023-12-13 DIAGNOSIS — E1142 Type 2 diabetes mellitus with diabetic polyneuropathy: Secondary | ICD-10-CM | POA: Diagnosis not present

## 2023-12-13 DIAGNOSIS — L97522 Non-pressure chronic ulcer of other part of left foot with fat layer exposed: Secondary | ICD-10-CM | POA: Diagnosis not present

## 2023-12-13 DIAGNOSIS — Z89422 Acquired absence of other left toe(s): Secondary | ICD-10-CM | POA: Diagnosis not present

## 2023-12-14 ENCOUNTER — Other Ambulatory Visit: Payer: Self-pay | Admitting: Internal Medicine

## 2023-12-14 DIAGNOSIS — J209 Acute bronchitis, unspecified: Secondary | ICD-10-CM

## 2023-12-28 DIAGNOSIS — Z6841 Body Mass Index (BMI) 40.0 and over, adult: Secondary | ICD-10-CM | POA: Diagnosis not present

## 2023-12-28 DIAGNOSIS — Z794 Long term (current) use of insulin: Secondary | ICD-10-CM | POA: Diagnosis not present

## 2023-12-28 DIAGNOSIS — Z008 Encounter for other general examination: Secondary | ICD-10-CM | POA: Diagnosis not present

## 2023-12-28 DIAGNOSIS — E1122 Type 2 diabetes mellitus with diabetic chronic kidney disease: Secondary | ICD-10-CM | POA: Diagnosis not present

## 2023-12-28 DIAGNOSIS — I2510 Atherosclerotic heart disease of native coronary artery without angina pectoris: Secondary | ICD-10-CM | POA: Diagnosis not present

## 2023-12-28 DIAGNOSIS — J449 Chronic obstructive pulmonary disease, unspecified: Secondary | ICD-10-CM | POA: Diagnosis not present

## 2023-12-28 DIAGNOSIS — N1832 Chronic kidney disease, stage 3b: Secondary | ICD-10-CM | POA: Diagnosis not present

## 2023-12-28 DIAGNOSIS — F3341 Major depressive disorder, recurrent, in partial remission: Secondary | ICD-10-CM | POA: Diagnosis not present

## 2024-01-03 DIAGNOSIS — E1159 Type 2 diabetes mellitus with other circulatory complications: Secondary | ICD-10-CM | POA: Diagnosis not present

## 2024-01-03 DIAGNOSIS — E785 Hyperlipidemia, unspecified: Secondary | ICD-10-CM | POA: Diagnosis not present

## 2024-01-03 DIAGNOSIS — E1169 Type 2 diabetes mellitus with other specified complication: Secondary | ICD-10-CM | POA: Diagnosis not present

## 2024-01-03 DIAGNOSIS — Z794 Long term (current) use of insulin: Secondary | ICD-10-CM | POA: Diagnosis not present

## 2024-01-03 DIAGNOSIS — I152 Hypertension secondary to endocrine disorders: Secondary | ICD-10-CM | POA: Diagnosis not present

## 2024-01-03 DIAGNOSIS — E039 Hypothyroidism, unspecified: Secondary | ICD-10-CM | POA: Diagnosis not present

## 2024-01-03 DIAGNOSIS — M2042 Other hammer toe(s) (acquired), left foot: Secondary | ICD-10-CM | POA: Diagnosis not present

## 2024-01-03 DIAGNOSIS — L97522 Non-pressure chronic ulcer of other part of left foot with fat layer exposed: Secondary | ICD-10-CM | POA: Diagnosis not present

## 2024-01-03 DIAGNOSIS — E1142 Type 2 diabetes mellitus with diabetic polyneuropathy: Secondary | ICD-10-CM | POA: Diagnosis not present

## 2024-01-07 ENCOUNTER — Telehealth: Payer: Self-pay

## 2024-01-07 ENCOUNTER — Telehealth: Payer: Self-pay | Admitting: Family Medicine

## 2024-01-07 ENCOUNTER — Other Ambulatory Visit: Payer: Self-pay | Admitting: Family Medicine

## 2024-01-07 DIAGNOSIS — F331 Major depressive disorder, recurrent, moderate: Secondary | ICD-10-CM

## 2024-01-07 DIAGNOSIS — K219 Gastro-esophageal reflux disease without esophagitis: Secondary | ICD-10-CM

## 2024-01-07 DIAGNOSIS — I10 Essential (primary) hypertension: Secondary | ICD-10-CM

## 2024-01-07 MED ORDER — SERTRALINE HCL 100 MG PO TABS: 150.0000 mg | ORAL_TABLET | Freq: Every day | ORAL | 0 refills | Status: DC

## 2024-01-07 NOTE — Telephone Encounter (Signed)
sertraline (ZOLOFT) 100 MG tablet

## 2024-01-07 NOTE — Telephone Encounter (Signed)
Courtesy refill given.  

## 2024-01-07 NOTE — Telephone Encounter (Signed)
 VASCEPA  1 g capsule

## 2024-01-07 NOTE — Telephone Encounter (Signed)
 Refill too soon

## 2024-01-07 NOTE — Telephone Encounter (Signed)
amLODipine (NORVASC) 5 MG tablet ° °

## 2024-01-07 NOTE — Telephone Encounter (Signed)
 Not received anything, if PCP did not order it we will not sign it.

## 2024-01-07 NOTE — Telephone Encounter (Signed)
 Courtesy refill

## 2024-01-07 NOTE — Telephone Encounter (Signed)
omeprazole (PRILOSEC) 40 MG capsule ?

## 2024-01-07 NOTE — Telephone Encounter (Signed)
 Copied from CRM [id number]. Topic: General - Other >> Jan 07, 2024  2:12 PM Fredrica W wrote: Reason for CRM: Sims Duck from [facility] called about faxed lab requisition form sent 4/24. Not showing uploaded in Media. Will try to refax. Thank you.

## 2024-01-07 NOTE — Telephone Encounter (Signed)
 Med Refill on  ARIPiprazole  (ABILIFY ) 2 MG tablet

## 2024-01-17 DIAGNOSIS — Z89422 Acquired absence of other left toe(s): Secondary | ICD-10-CM | POA: Diagnosis not present

## 2024-01-17 DIAGNOSIS — E1142 Type 2 diabetes mellitus with diabetic polyneuropathy: Secondary | ICD-10-CM | POA: Diagnosis not present

## 2024-01-17 DIAGNOSIS — Z794 Long term (current) use of insulin: Secondary | ICD-10-CM | POA: Diagnosis not present

## 2024-01-17 DIAGNOSIS — L97522 Non-pressure chronic ulcer of other part of left foot with fat layer exposed: Secondary | ICD-10-CM | POA: Diagnosis not present

## 2024-01-20 ENCOUNTER — Other Ambulatory Visit: Payer: Self-pay | Admitting: Family Medicine

## 2024-01-20 DIAGNOSIS — E1169 Type 2 diabetes mellitus with other specified complication: Secondary | ICD-10-CM

## 2024-01-21 ENCOUNTER — Telehealth: Payer: Self-pay

## 2024-01-21 NOTE — Telephone Encounter (Signed)
 Refill request on   rosuvastatin  (CRESTOR ) 40 MG tablet

## 2024-01-21 NOTE — Telephone Encounter (Signed)
Patient has an appt tomorrow

## 2024-01-22 ENCOUNTER — Ambulatory Visit: Payer: No Typology Code available for payment source | Admitting: Family Medicine

## 2024-02-06 ENCOUNTER — Telehealth: Payer: Self-pay | Admitting: Family Medicine

## 2024-02-06 ENCOUNTER — Other Ambulatory Visit: Payer: Self-pay | Admitting: Family Medicine

## 2024-02-06 ENCOUNTER — Other Ambulatory Visit: Payer: Self-pay

## 2024-02-06 DIAGNOSIS — K219 Gastro-esophageal reflux disease without esophagitis: Secondary | ICD-10-CM

## 2024-02-06 DIAGNOSIS — F5105 Insomnia due to other mental disorder: Secondary | ICD-10-CM

## 2024-02-06 DIAGNOSIS — I10 Essential (primary) hypertension: Secondary | ICD-10-CM

## 2024-02-06 DIAGNOSIS — F331 Major depressive disorder, recurrent, moderate: Secondary | ICD-10-CM

## 2024-02-06 MED ORDER — ARIPIPRAZOLE 2 MG PO TABS: 2.0000 mg | ORAL_TABLET | Freq: Every evening | ORAL | 0 refills | Status: DC

## 2024-02-06 MED ORDER — AMLODIPINE BESYLATE 5 MG PO TABS: 5.0000 mg | ORAL_TABLET | Freq: Every day | ORAL | 0 refills | Status: DC

## 2024-02-06 MED ORDER — SERTRALINE HCL 100 MG PO TABS: 150.0000 mg | ORAL_TABLET | Freq: Every day | ORAL | 0 refills | Status: DC

## 2024-02-06 MED ORDER — TRAZODONE HCL 100 MG PO TABS: 100.0000 mg | ORAL_TABLET | Freq: Every evening | ORAL | 0 refills | Status: DC | PRN

## 2024-02-06 MED ORDER — OMEPRAZOLE 40 MG PO CPDR: 40.0000 mg | DELAYED_RELEASE_CAPSULE | Freq: Every day | ORAL | 0 refills | Status: DC

## 2024-02-06 NOTE — Telephone Encounter (Signed)
traZODone (DESYREL) 100 MG tablet  ° °

## 2024-02-06 NOTE — Telephone Encounter (Signed)
omeprazole (PRILOSEC) 40 MG capsule ?

## 2024-02-06 NOTE — Telephone Encounter (Signed)
amLODipine (NORVASC) 5 MG tablet ° °

## 2024-02-06 NOTE — Telephone Encounter (Signed)
sertraline (ZOLOFT) 100 MG tablet

## 2024-02-06 NOTE — Telephone Encounter (Signed)
 ARIPiprazole  (ABILIFY ) 2 MG tablet

## 2024-02-13 DIAGNOSIS — Z794 Long term (current) use of insulin: Secondary | ICD-10-CM | POA: Diagnosis not present

## 2024-02-13 DIAGNOSIS — E785 Hyperlipidemia, unspecified: Secondary | ICD-10-CM | POA: Diagnosis not present

## 2024-02-13 DIAGNOSIS — E039 Hypothyroidism, unspecified: Secondary | ICD-10-CM | POA: Diagnosis not present

## 2024-02-13 DIAGNOSIS — E1142 Type 2 diabetes mellitus with diabetic polyneuropathy: Secondary | ICD-10-CM | POA: Diagnosis not present

## 2024-02-13 DIAGNOSIS — E1169 Type 2 diabetes mellitus with other specified complication: Secondary | ICD-10-CM | POA: Diagnosis not present

## 2024-02-13 DIAGNOSIS — I152 Hypertension secondary to endocrine disorders: Secondary | ICD-10-CM | POA: Diagnosis not present

## 2024-02-13 DIAGNOSIS — E1159 Type 2 diabetes mellitus with other circulatory complications: Secondary | ICD-10-CM | POA: Diagnosis not present

## 2024-02-27 DIAGNOSIS — N1832 Chronic kidney disease, stage 3b: Secondary | ICD-10-CM | POA: Diagnosis not present

## 2024-02-27 DIAGNOSIS — I10 Essential (primary) hypertension: Secondary | ICD-10-CM | POA: Diagnosis not present

## 2024-02-27 DIAGNOSIS — D631 Anemia in chronic kidney disease: Secondary | ICD-10-CM | POA: Diagnosis not present

## 2024-02-27 DIAGNOSIS — E1122 Type 2 diabetes mellitus with diabetic chronic kidney disease: Secondary | ICD-10-CM | POA: Diagnosis not present

## 2024-02-27 DIAGNOSIS — N2581 Secondary hyperparathyroidism of renal origin: Secondary | ICD-10-CM | POA: Diagnosis not present

## 2024-03-07 ENCOUNTER — Other Ambulatory Visit: Payer: Self-pay | Admitting: Family Medicine

## 2024-03-07 ENCOUNTER — Telehealth: Payer: Self-pay | Admitting: Family Medicine

## 2024-03-07 DIAGNOSIS — F331 Major depressive disorder, recurrent, moderate: Secondary | ICD-10-CM

## 2024-03-07 DIAGNOSIS — F5105 Insomnia due to other mental disorder: Secondary | ICD-10-CM

## 2024-03-07 DIAGNOSIS — I5032 Chronic diastolic (congestive) heart failure: Secondary | ICD-10-CM

## 2024-03-07 NOTE — Telephone Encounter (Signed)
 metoprolol  tartrate (LOPRESSOR ) 50 MG tablet   omeprazole  (PRILOSEC) 40 MG capsule   ARIPiprazole  (ABILIFY ) 2 MG tablet   traZODone  (DESYREL ) 100 MG tablet   sertraline  (ZOLOFT ) 100 MG tablet   amLODipine  (NORVASC ) 5 MG tablet

## 2024-03-07 NOTE — Telephone Encounter (Signed)
 Mailbox full need to schedule appt

## 2024-03-07 NOTE — Telephone Encounter (Signed)
 Pt been due for a follow up, unable to refill pt needs an appointment

## 2024-03-07 NOTE — Telephone Encounter (Signed)
buPROPion (WELLBUTRIN XL) 150 MG 24 hr tablet ° °

## 2024-03-27 DIAGNOSIS — E1169 Type 2 diabetes mellitus with other specified complication: Secondary | ICD-10-CM | POA: Diagnosis not present

## 2024-03-27 DIAGNOSIS — E039 Hypothyroidism, unspecified: Secondary | ICD-10-CM | POA: Diagnosis not present

## 2024-03-27 DIAGNOSIS — E1159 Type 2 diabetes mellitus with other circulatory complications: Secondary | ICD-10-CM | POA: Diagnosis not present

## 2024-03-27 DIAGNOSIS — E1142 Type 2 diabetes mellitus with diabetic polyneuropathy: Secondary | ICD-10-CM | POA: Diagnosis not present

## 2024-03-27 DIAGNOSIS — Z794 Long term (current) use of insulin: Secondary | ICD-10-CM | POA: Diagnosis not present

## 2024-03-27 DIAGNOSIS — I152 Hypertension secondary to endocrine disorders: Secondary | ICD-10-CM | POA: Diagnosis not present

## 2024-03-27 DIAGNOSIS — E785 Hyperlipidemia, unspecified: Secondary | ICD-10-CM | POA: Diagnosis not present

## 2024-03-27 LAB — BASIC METABOLIC PANEL WITH GFR
BUN: 34 — AB (ref 4–21)
CO2: 28 — AB (ref 13–22)
Chloride: 105 (ref 99–108)
Creatinine: 1.4 — AB (ref 0.5–1.1)
Glucose: 128
Potassium: 4.9 meq/L (ref 3.5–5.1)
Sodium: 140 (ref 137–147)

## 2024-03-27 LAB — CBC AND DIFFERENTIAL
HCT: 39 (ref 36–46)
Hemoglobin: 12.8 (ref 12.0–16.0)
Neutrophils Absolute: 5392
Platelets: 191 K/uL (ref 150–400)
WBC: 8.6

## 2024-03-27 LAB — MICROALBUMIN, URINE: Microalb, Ur: 3.4

## 2024-03-27 LAB — COMPREHENSIVE METABOLIC PANEL WITH GFR
Calcium: 10.3 (ref 8.7–10.7)
eGFR: 39

## 2024-03-27 LAB — HM DIABETES FOOT EXAM: HM Diabetic Foot Exam: NORMAL

## 2024-03-27 LAB — CBC: RBC: 4.34 (ref 3.87–5.11)

## 2024-03-27 LAB — HEMOGLOBIN A1C: Hemoglobin A1C: 7.7

## 2024-03-27 LAB — PROTEIN / CREATININE RATIO, URINE: Creatinine, Urine: 60

## 2024-03-27 LAB — MICROALBUMIN / CREATININE URINE RATIO: Microalb Creat Ratio: 57

## 2024-03-31 ENCOUNTER — Other Ambulatory Visit: Payer: Self-pay | Admitting: Family Medicine

## 2024-03-31 DIAGNOSIS — I10 Essential (primary) hypertension: Secondary | ICD-10-CM

## 2024-03-31 DIAGNOSIS — F331 Major depressive disorder, recurrent, moderate: Secondary | ICD-10-CM

## 2024-03-31 DIAGNOSIS — K219 Gastro-esophageal reflux disease without esophagitis: Secondary | ICD-10-CM

## 2024-03-31 NOTE — Telephone Encounter (Unsigned)
 Copied from CRM [id number]. Topic: Clinical - Medication Refill >> Mar 31, 2024  8:50 AM Larissa S wrote: Medication: amLODipine  (NORVASC ) 5 MG tablet sertraline  (ZOLOFT ) 100 MG tablet ARIPiprazole  (ABILIFY ) 2 MG tablet omeprazole  (PRILOSEC) 40 MG capsule  Has the patient contacted their pharmacy? Yes (Agent: If no, request that the patient contact the pharmacy for the refill. If patient does not wish to contact the pharmacy document the reason why and proceed with request.) (Agent: If yes, when and what did the pharmacy advise?)  This is the patient's preferred pharmacy:  [facility] - [place], [place] - [address] [place] [address] Phone: [phone] Fax: [phone]    Is this the correct pharmacy for this prescription? Yes If no, delete pharmacy and type the correct one.   Has the prescription been filled recently? No  Is the patient out of the medication? No  Has the patient been seen for an appointment in the last year OR does the patient have an upcoming appointment? Yes  Can we respond through MyChart? No  Agent: Please be advised that Rx refills may take up to 3 business days. We ask that you follow-up with your pharmacy.

## 2024-04-02 MED ORDER — OMEPRAZOLE 40 MG PO CPDR: 40.0000 mg | DELAYED_RELEASE_CAPSULE | Freq: Every day | ORAL | 0 refills | Status: DC

## 2024-04-02 MED ORDER — AMLODIPINE BESYLATE 5 MG PO TABS: 5.0000 mg | ORAL_TABLET | Freq: Every day | ORAL | 0 refills | Status: DC

## 2024-04-02 MED ORDER — SERTRALINE HCL 100 MG PO TABS: 150.0000 mg | ORAL_TABLET | Freq: Every day | ORAL | 0 refills | Status: DC

## 2024-04-02 NOTE — Telephone Encounter (Signed)
 Requested Prescriptions  Pending Prescriptions Disp Refills   amLODipine  (NORVASC ) 5 MG tablet 90 tablet 0    Sig: Take 1 tablet (5 mg total) by mouth daily.     Cardiovascular: Calcium  Channel Blockers 2 Passed - 04/02/2024 11:07 AM      Passed - Last BP in normal range    BP Readings from Last 1 Encounters:  12/10/23 128/76         Passed - Last Heart Rate in normal range    Pulse Readings from Last 1 Encounters:  12/10/23 70         Passed - Valid encounter within last 6 months    Recent Outpatient Visits           5 months ago Pre-op evaluation   [facility] [place], Dorette, MD               sertraline  (ZOLOFT ) 100 MG tablet 135 tablet 0    Sig: Take 1.5 tablets (150 mg total) by mouth daily.     Psychiatry:  Antidepressants - SSRI - sertraline  Passed - 04/02/2024 11:07 AM      Passed - AST in normal range and within 360 days    AST  Date Value Ref Range Status  10/23/2023 13 10 - 35 U/L Final         Passed - ALT in normal range and within 360 days    ALT  Date Value Ref Range Status  10/23/2023 13 6 - 29 U/L Final         Passed - Completed PHQ-2 or PHQ-9 in the last 360 days      Passed - Valid encounter within last 6 months    Recent Outpatient Visits           5 months ago Pre-op evaluation   [facility], Dorette, MD               omeprazole  (PRILOSEC) 40 MG capsule 90 capsule 0    Sig: Take 1 capsule (40 mg total) by mouth daily.     Gastroenterology: Proton Pump Inhibitors Passed - 04/02/2024 11:07 AM      Passed - Valid encounter within last 12 months    Recent Outpatient Visits           5 months ago Pre-op evaluation   [place], Krichna, MD               ARIPiprazole  (ABILIFY ) 2 MG tablet 30 tablet 0    Sig: Take 1 tablet (2 mg total) by mouth every evening.     Not Delegated - Psychiatry:  Antipsychotics - Second Generation  (Atypical) - aripiprazole  Failed - 04/02/2024 11:07 AM      Failed - This refill cannot be delegated      Failed - Lipid Panel in normal range within the last 12 months    Cholesterol, Total  Date Value Ref Range Status  05/13/2020 209 (H) 100 - 199 mg/dL Final   Cholesterol  Date Value Ref Range Status  06/06/2022 141 0 - 200 Final   LDL Cholesterol (Calc)  Date Value Ref Range Status  10/03/2021 64 mg/dL (calc) Final    Comment:    Reference range: <100 . Desirable range <100 mg/dL for primary prevention;   <70 mg/dL for patients with CHD or diabetic patients  with > or = 2 CHD risk factors. [facility] LDL-C is now  calculated using the Martin-Hopkins  calculation, which is a validated novel method providing  better accuracy than the Friedewald equation in the  estimation of LDL-C.  Gladis APPLETHWAITE et al. SANDREA. 7986;689(80): 2061-2068  (http://education.[employer].com/faq/FAQ164)    LDL Cholesterol  Date Value Ref Range Status  06/06/2022 43  Final   HDL  Date Value Ref Range Status  06/06/2022 39 35 - 70 Final  05/13/2020 38 (L) >39 mg/dL Final   Triglycerides  Date Value Ref Range Status  06/06/2022 295 (A) 40 - 160 Final         Failed - CMP within normal limits and completed in the last 12 months    Albumin  Date Value Ref Range Status  06/06/2022 4.4 3.5 - 5.0 Final  09/24/2020 4.4 3.7 - 4.7 g/dL Final   Alkaline Phosphatase  Date Value Ref Range Status  06/06/2022 59 25 - 125 Final   Alkaline phosphatase (APISO)  Date Value Ref Range Status  10/23/2023 92 37 - 153 U/L Final   ALT  Date Value Ref Range Status  10/23/2023 13 6 - 29 U/L Final   AST  Date Value Ref Range Status  10/23/2023 13 10 - 35 U/L Final   BUN  Date Value Ref Range Status  10/23/2023 36 (H) 7 - 25 mg/dL Final  89/89/7976 52 (A) 4 - 21 Final   Calcium   Date Value Ref Range Status  10/23/2023 10.1 8.6 - 10.4 mg/dL Final   CO2  Date Value Ref Range Status  10/23/2023 26 20 -  32 mmol/L Final   Bicarbonate  Date Value Ref Range Status  08/20/2022 20.1 20.0 - 28.0 mmol/L Final   Creat  Date Value Ref Range Status  10/23/2023 1.64 (H) 0.60 - 1.00 mg/dL Final   Creatinine, Urine  Date Value Ref Range Status  06/26/2023 57  Final   Glucose  Date Value Ref Range Status  06/20/2022 140  Final   Glucose, Bld  Date Value Ref Range Status  10/23/2023 421 (H) 65 - 99 mg/dL Final    Comment:    Verified by repeat analysis. SABRA .            Fasting reference interval . For someone without known diabetes, a glucose value >125 mg/dL indicates that they may have diabetes and this should be confirmed with a follow-up test. .    POC Glucose  Date Value Ref Range Status  05/18/2017 254 (A) 70 - 99 mg/dl Final   Glucose-Capillary  Date Value Ref Range Status  05/04/2023 191 (H) 70 - 99 mg/dL Final    Comment:    Glucose reference range applies only to samples taken after fasting for at least 8 hours.   Potassium  Date Value Ref Range Status  10/23/2023 4.9 3.5 - 5.3 mmol/L Final   Sodium  Date Value Ref Range Status  10/23/2023 135 135 - 146 mmol/L Final  06/20/2022 141 137 - 147 Final   Total Bilirubin  Date Value Ref Range Status  10/23/2023 0.3 0.2 - 1.2 mg/dL Final   Bilirubin Total  Date Value Ref Range Status  09/24/2020 0.3 0.0 - 1.2 mg/dL Final   Bilirubin, Total  Date Value Ref Range Status  06/06/2022 0.4  Final   Bilirubin, Direct  Date Value Ref Range Status  10/03/2021 0.1 0.0 - 0.2 mg/dL Final   Indirect Bilirubin  Date Value Ref Range Status  10/03/2021 0.3 0.2 - 1.2 mg/dL (calc) Final   Protein, ur  Date Value Ref Range Status  08/10/2022 100 (A) NEGATIVE mg/dL Final   Total Protein  Date Value Ref Range Status  10/23/2023 6.7 6.1 - 8.1 g/dL Final  98/85/7977 6.9 6.0 - 8.5 g/dL Final   GFR, Est African American  Date Value Ref Range Status  01/28/2021 46 (L) > OR = 60 mL/min/1.79m2 Final   eGFR  Date Value  Ref Range Status  10/23/2023 33 (L) > OR = 60 mL/min/1.51m2 Final   GFR, Est Non African American  Date Value Ref Range Status  01/28/2021 40 (L) > OR = 60 mL/min/1.48m2 Final   GFR, Estimated  Date Value Ref Range Status  05/03/2023 56 (L) >60 mL/min Final    Comment:    (NOTE) Calculated using the CKD-EPI Creatinine Equation (2021) Performed at [facility], [address] [place], [place] [address]          Passed - TSH in normal range and within 360 days    TSH  Date Value Ref Range Status  10/23/2023 1.83 0.40 - 4.50 mIU/L Final         Passed - Completed PHQ-2 or PHQ-9 in the last 360 days      Passed - Last BP in normal range    BP Readings from Last 1 Encounters:  12/10/23 128/76         Passed - Last Heart Rate in normal range    Pulse Readings from Last 1 Encounters:  12/10/23 70         Passed - Valid encounter within last 6 months    Recent Outpatient Visits           5 months ago Pre-op evaluation   [place] [place], MD              Passed - CBC within normal limits and completed in the last 12 months    WBC  Date Value Ref Range Status  10/23/2023 9.9 3.8 - 10.8 Thousand/uL Final   RBC  Date Value Ref Range Status  10/23/2023 4.65 3.80 - 5.10 Million/uL Final   Hemoglobin  Date Value Ref Range Status  10/23/2023 13.1 11.7 - 15.5 g/dL Final  90/97/7978 88.8 11.1 - 15.9 g/dL Final   HCT  Date Value Ref Range Status  10/23/2023 40.9 35.0 - 45.0 % Final   Hematocrit  Date Value Ref Range Status  05/13/2020 33.7 (L) 34.0 - 46.6 % Final   MCHC  Date Value Ref Range Status  10/23/2023 32.0 32.0 - 36.0 g/dL Final    Comment:    For adults, a slight decrease in the calculated MCHC value (in the range of 30 to 32 g/dL) is most likely not clinically significant; however, it should be interpreted with caution in correlation with other red cell parameters and the patient's  clinical condition.    [facility]  Date Value Ref Range Status  10/23/2023 28.2 27.0 - 33.0 pg Final   MCV  Date Value Ref Range Status  10/23/2023 88.0 80.0 - 100.0 fL Final  05/13/2020 87 79 - 97 fL Final   No results found for: PLTCOUNTKUC, LABPLAT, POCPLA RDW  Date Value Ref Range Status  10/23/2023 14.8 11.0 - 15.0 % Final  05/13/2020 15.0 11.7 - 15.4 % Final

## 2024-04-02 NOTE — Telephone Encounter (Signed)
 Refused needs appt, no answer vm full

## 2024-04-02 NOTE — Telephone Encounter (Signed)
 Requested medication (s) are due for refill today: yes  Requested medication (s) are on the active medication list: yes  Last refill:  02/06/24  Future visit scheduled: yes  Notes to clinic:  Unable to refill per protocol, cannot delegate.      Requested Prescriptions  Pending Prescriptions Disp Refills   ARIPiprazole  (ABILIFY ) 2 MG tablet 30 tablet 0    Sig: Take 1 tablet (2 mg total) by mouth every evening.     Not Delegated - Psychiatry:  Antipsychotics - Second Generation (Atypical) - aripiprazole  Failed - 04/02/2024 11:08 AM      Failed - This refill cannot be delegated      Failed - Lipid Panel in normal range within the last 12 months    Cholesterol, Total  Date Value Ref Range Status  05/13/2020 209 (H) 100 - 199 mg/dL Final   Cholesterol  Date Value Ref Range Status  06/06/2022 141 0 - 200 Final   LDL Cholesterol (Calc)  Date Value Ref Range Status  10/03/2021 64 mg/dL (calc) Final    Comment:    Reference range: <100 . Desirable range <100 mg/dL for primary prevention;   <70 mg/dL for patients with CHD or diabetic patients  with > or = 2 CHD risk factors. [facility] LDL-C is now calculated using the Martin-Hopkins  calculation, which is a validated novel method providing  better accuracy than the Friedewald equation in the  estimation of LDL-C.  Gladis APPLETHWAITE et al. SANDREA. 7986;689(80): 2061-2068  (http://education.[facility].com/faq/FAQ164)    LDL Cholesterol  Date Value Ref Range Status  06/06/2022 43  Final   HDL  Date Value Ref Range Status  06/06/2022 39 35 - 70 Final  05/13/2020 38 (L) >39 mg/dL Final   Triglycerides  Date Value Ref Range Status  06/06/2022 295 (A) 40 - 160 Final         Failed - CMP within normal limits and completed in the last 12 months    Albumin  Date Value Ref Range Status  06/06/2022 4.4 3.5 - 5.0 Final  09/24/2020 4.4 3.7 - 4.7 g/dL Final   Alkaline Phosphatase  Date Value Ref Range Status  06/06/2022 59 25 - 125  Final   Alkaline phosphatase (APISO)  Date Value Ref Range Status  10/23/2023 92 37 - 153 U/L Final   ALT  Date Value Ref Range Status  10/23/2023 13 6 - 29 U/L Final   AST  Date Value Ref Range Status  10/23/2023 13 10 - 35 U/L Final   BUN  Date Value Ref Range Status  10/23/2023 36 (H) 7 - 25 mg/dL Final  89/89/7976 52 (A) 4 - 21 Final   Calcium   Date Value Ref Range Status  10/23/2023 10.1 8.6 - 10.4 mg/dL Final   CO2  Date Value Ref Range Status  10/23/2023 26 20 - 32 mmol/L Final   Bicarbonate  Date Value Ref Range Status  08/20/2022 20.1 20.0 - 28.0 mmol/L Final   Creat  Date Value Ref Range Status  10/23/2023 1.64 (H) 0.60 - 1.00 mg/dL Final   Creatinine, Urine  Date Value Ref Range Status  06/26/2023 57  Final   Glucose  Date Value Ref Range Status  06/20/2022 140  Final   Glucose, Bld  Date Value Ref Range Status  10/23/2023 421 (H) 65 - 99 mg/dL Final    Comment:    Verified by repeat analysis. [facility] .            Fasting  reference interval . For someone without known diabetes, a glucose value >125 mg/dL indicates that they may have diabetes and this should be confirmed with a follow-up test. .    POC Glucose  Date Value Ref Range Status  05/18/2017 254 (A) 70 - 99 mg/dl Final   Glucose-Capillary  Date Value Ref Range Status  05/04/2023 191 (H) 70 - 99 mg/dL Final    Comment:    Glucose reference range applies only to samples taken after fasting for at least 8 hours.   Potassium  Date Value Ref Range Status  10/23/2023 4.9 3.5 - 5.3 mmol/L Final   Sodium  Date Value Ref Range Status  10/23/2023 135 135 - 146 mmol/L Final  06/20/2022 141 137 - 147 Final   Total Bilirubin  Date Value Ref Range Status  10/23/2023 0.3 0.2 - 1.2 mg/dL Final   Bilirubin Total  Date Value Ref Range Status  09/24/2020 0.3 0.0 - 1.2 mg/dL Final   Bilirubin, Total  Date Value Ref Range Status  06/06/2022 0.4  Final   Bilirubin, Direct  Date Value  Ref Range Status  10/03/2021 0.1 0.0 - 0.2 mg/dL Final   Indirect Bilirubin  Date Value Ref Range Status  10/03/2021 0.3 0.2 - 1.2 mg/dL (calc) Final   Protein, ur  Date Value Ref Range Status  08/10/2022 100 (A) NEGATIVE mg/dL Final   Total Protein  Date Value Ref Range Status  10/23/2023 6.7 6.1 - 8.1 g/dL Final  98/85/7977 6.9 6.0 - 8.5 g/dL Final   GFR, Est African American  Date Value Ref Range Status  01/28/2021 46 (L) > OR = 60 mL/min/1.64m2 Final   eGFR  Date Value Ref Range Status  10/23/2023 33 (L) > OR = 60 mL/min/1.38m2 Final   GFR, Est Non African American  Date Value Ref Range Status  01/28/2021 40 (L) > OR = 60 mL/min/1.25m2 Final   GFR, Estimated  Date Value Ref Range Status  05/03/2023 56 (L) >60 mL/min Final    Comment:    (NOTE) Calculated using the CKD-EPI Creatinine Equation (2021) Performed at [facility], [address] [place], [place] [address]          Passed - TSH in normal range and within 360 days    TSH  Date Value Ref Range Status  10/23/2023 1.83 0.40 - 4.50 mIU/L Final         Passed - Completed PHQ-2 or PHQ-9 in the last 360 days      Passed - Last BP in normal range    BP Readings from Last 1 Encounters:  12/10/23 128/76         Passed - Last Heart Rate in normal range    Pulse Readings from Last 1 Encounters:  12/10/23 70         Passed - Valid encounter within last 6 months    Recent Outpatient Visits           5 months ago Pre-op evaluation   [facility], Doret[place], MD              Passed - CBC within normal limits and completed in the last 12 months    WBC  Date Value Ref Range Status  10/23/2023 9.9 3.8 - 10.8 Thousand/uL Final   RBC  Date Value Ref Range Status  10/23/2023 4.65 3.80 - 5.10 Million/uL Final   Hemoglobin  Date Value Ref Range Status  10/23/2023 13.1 11.7 -  15.5 g/dL Final  90/97/7978 88.8 11.1 - 15.9 g/dL Final   HCT  Date Value Ref  Range Status  10/23/2023 40.9 35.0 - 45.0 % Final   Hematocrit  Date Value Ref Range Status  05/13/2020 33.7 (L) 34.0 - 46.6 % Final   MCHC  Date Value Ref Range Status  10/23/2023 32.0 32.0 - 36.0 g/dL Final    Comment:    For adults, a slight decrease in the calculated MCHC value (in the range of 30 to 32 g/dL) is most likely not clinically significant; however, it should be interpreted with caution in correlation with other red cell parameters and the patient's clinical condition.    [facility]  Date Value Ref Range Status  10/23/2023 28.2 27.0 - 33.0 pg Final   MCV  Date Value Ref Range Status  10/23/2023 88.0 80.0 - 100.0 fL Final  05/13/2020 87 79 - 97 fL Final   No results found for: PLTCOUNTKUC, LABPLAT, POCPLA RDW  Date Value Ref Range Status  10/23/2023 14.8 11.0 - 15.0 % Final  05/13/2020 15.0 11.7 - 15.4 % Final         Signed Prescriptions Disp Refills   amLODipine  (NORVASC ) 5 MG tablet 90 tablet 0    Sig: Take 1 tablet (5 mg total) by mouth daily.     Cardiovascular: Calcium  Channel Blockers 2 Passed - 04/02/2024 11:08 AM      Passed - Last BP in normal range    BP Readings from Last 1 Encounters:  12/10/23 128/76         Passed - Last Heart Rate in normal range    Pulse Readings from Last 1 Encounters:  12/10/23 70         Passed - Valid encounter within last 6 months    Recent Outpatient Visits           5 months ago Pre-op evaluation   [place], Dorette, MD               sertraline  (ZOLOFT ) 100 MG tablet 135 tablet 0    Sig: Take 1.5 tablets (150 mg total) by mouth daily.     Psychiatry:  Antidepressants - SSRI - sertraline  Passed - 04/02/2024 11:08 AM      Passed - AST in normal range and within 360 days    AST  Date Value Ref Range Status  10/23/2023 13 10 - 35 U/L Final         Passed - ALT in normal range and within 360 days    ALT  Date Value Ref Range Status  10/23/2023 13 6 - 29  U/L Final         Passed - Completed PHQ-2 or PHQ-9 in the last 360 days      Passed - Valid encounter within last 6 months    Recent Outpatient Visits           5 months ago Pre-op evaluation   [facility], Dorette, MD               omeprazole  (PRILOSEC) 40 MG capsule 90 capsule 0    Sig: Take 1 capsule (40 mg total) by mouth daily.     Gastroenterology: Proton Pump Inhibitors Passed - 04/02/2024 11:08 AM      Passed - Valid encounter within last 12 months    Recent Outpatient Visits           5 months  ago Pre-op evaluation   [facility], Krichna, MD

## 2024-04-03 ENCOUNTER — Other Ambulatory Visit: Payer: Self-pay | Admitting: Family Medicine

## 2024-04-03 DIAGNOSIS — F5105 Insomnia due to other mental disorder: Secondary | ICD-10-CM

## 2024-04-03 MED ORDER — TRAZODONE HCL 100 MG PO TABS: 100.0000 mg | ORAL_TABLET | Freq: Every evening | ORAL | 0 refills | Status: DC | PRN

## 2024-04-03 NOTE — Telephone Encounter (Signed)
 Copied from CRM [id number]. Topic: Clinical - Medication Refill >> Apr 03, 2024  9:47 AM Carlatta H wrote: Medication: traZODone  (DESYREL ) 100 MG tablet  Has the patient contacted their pharmacy? No (Agent: If no, request that the patient contact the pharmacy for the refill. If patient does not wish to contact the pharmacy document the reason why and proceed with request.) (Agent: If yes, when and what did the pharmacy advise?)  This is the patient's preferred pharmacy:  [facility] - [place], [place] - [address] [place] [address] Phone: [phone] Fax: [phone]    Is this the correct pharmacy for this prescription? Yes If no, delete pharmacy and type the correct one.   Has the prescription been filled recently? No  Is the patient out of the medication? Yes  Has the patient been seen for an appointment in the last year OR does the patient have an upcoming appointment? No  Can we respond through MyChart? No  Agent: Please be advised that Rx refills may take up to 3 business days. We ask that you follow-up with your pharmacy.

## 2024-04-03 NOTE — Telephone Encounter (Signed)
 Last OV 10/23/2023

## 2024-04-04 ENCOUNTER — Ambulatory Visit (INDEPENDENT_AMBULATORY_CARE_PROVIDER_SITE_OTHER): Admitting: Family Medicine

## 2024-04-04 ENCOUNTER — Encounter: Payer: Self-pay | Admitting: Family Medicine

## 2024-04-04 VITALS — BP 120/70 | HR 91 | Resp 16 | Ht 62.0 in | Wt 231.0 lb

## 2024-04-04 DIAGNOSIS — Z6841 Body Mass Index (BMI) 40.0 and over, adult: Secondary | ICD-10-CM

## 2024-04-04 DIAGNOSIS — I700 Atherosclerosis of aorta: Secondary | ICD-10-CM | POA: Diagnosis not present

## 2024-04-04 DIAGNOSIS — E11610 Type 2 diabetes mellitus with diabetic neuropathic arthropathy: Secondary | ICD-10-CM | POA: Diagnosis not present

## 2024-04-04 DIAGNOSIS — N2581 Secondary hyperparathyroidism of renal origin: Secondary | ICD-10-CM | POA: Diagnosis not present

## 2024-04-04 DIAGNOSIS — K219 Gastro-esophageal reflux disease without esophagitis: Secondary | ICD-10-CM

## 2024-04-04 DIAGNOSIS — I739 Peripheral vascular disease, unspecified: Secondary | ICD-10-CM | POA: Diagnosis not present

## 2024-04-04 DIAGNOSIS — N1832 Chronic kidney disease, stage 3b: Secondary | ICD-10-CM | POA: Diagnosis not present

## 2024-04-04 DIAGNOSIS — I10 Essential (primary) hypertension: Secondary | ICD-10-CM

## 2024-04-04 DIAGNOSIS — S98132A Complete traumatic amputation of one left lesser toe, initial encounter: Secondary | ICD-10-CM

## 2024-04-04 DIAGNOSIS — E1169 Type 2 diabetes mellitus with other specified complication: Secondary | ICD-10-CM

## 2024-04-04 DIAGNOSIS — F331 Major depressive disorder, recurrent, moderate: Secondary | ICD-10-CM | POA: Diagnosis not present

## 2024-04-04 DIAGNOSIS — F5105 Insomnia due to other mental disorder: Secondary | ICD-10-CM

## 2024-04-04 DIAGNOSIS — I5032 Chronic diastolic (congestive) heart failure: Secondary | ICD-10-CM | POA: Diagnosis not present

## 2024-04-04 MED ORDER — ROSUVASTATIN CALCIUM 40 MG PO TABS: 40.0000 mg | ORAL_TABLET | Freq: Every day | ORAL | 5 refills | Status: DC

## 2024-04-04 MED ORDER — VASCEPA 1 G PO CAPS: 2.0000 g | ORAL_CAPSULE | Freq: Two times a day (BID) | ORAL | 5 refills | Status: DC

## 2024-04-04 MED ORDER — FUROSEMIDE 20 MG PO TABS: 20.0000 mg | ORAL_TABLET | Freq: Every day | ORAL | 5 refills | Status: DC

## 2024-04-04 MED ORDER — SERTRALINE HCL 100 MG PO TABS: 150.0000 mg | ORAL_TABLET | Freq: Every day | ORAL | 5 refills | Status: DC

## 2024-04-04 MED ORDER — BUPROPION HCL ER (XL) 150 MG PO TB24: 150.0000 mg | ORAL_TABLET | Freq: Every day | ORAL | 5 refills | Status: DC

## 2024-04-04 MED ORDER — ARIPIPRAZOLE 2 MG PO TABS: 2.0000 mg | ORAL_TABLET | Freq: Every evening | ORAL | 5 refills | Status: DC

## 2024-04-04 MED ORDER — PREGABALIN 150 MG PO CAPS: 150.0000 mg | ORAL_CAPSULE | Freq: Three times a day (TID) | ORAL | 5 refills | Status: DC

## 2024-04-04 MED ORDER — FARXIGA 10 MG PO TABS: 10.0000 mg | ORAL_TABLET | Freq: Every morning | ORAL | 5 refills | Status: DC

## 2024-04-04 MED ORDER — METOPROLOL TARTRATE 50 MG PO TABS: 25.0000 mg | ORAL_TABLET | Freq: Two times a day (BID) | ORAL | 5 refills | Status: DC

## 2024-04-04 MED ORDER — OMEPRAZOLE 40 MG PO CPDR: 40.0000 mg | DELAYED_RELEASE_CAPSULE | Freq: Every day | ORAL | 5 refills | Status: DC

## 2024-04-04 MED ORDER — TRAZODONE HCL 100 MG PO TABS
100.0000 mg | ORAL_TABLET | Freq: Every evening | ORAL | 5 refills | Status: AC | PRN
Start: 2024-04-04 — End: ?

## 2024-04-04 MED ORDER — EZETIMIBE 10 MG PO TABS: 10.0000 mg | ORAL_TABLET | Freq: Every day | ORAL | 5 refills | Status: DC

## 2024-04-04 MED ORDER — AMLODIPINE BESYLATE 5 MG PO TABS: 5.0000 mg | ORAL_TABLET | Freq: Every day | ORAL | 5 refills | Status: DC

## 2024-04-04 NOTE — Progress Notes (Signed)
 Name: Summer [name]   MRN: [id number]    DOB: December 09, 1950   Date:04/04/2024       Progress Note  Subjective  Chief Complaint  Chief Complaint  Patient presents with   Medical Management of Chronic Issues   Medication Refill   Discussed the use of AI scribe software for clinical note transcription with the patient, who gave verbal consent to proceed.  History of Present Illness Summer [name] is a 73 year old female who presents for a medication refill and follow-up.  She has a history of type 2 diabetes, hypertension, and hyperlipidemia. Her A1c has improved from 12.4% in February to 7.7% recently. She is currently managed on 85 units of Tresiba daily, Novolog  on a sliding scale starting at 14 units, and Farxiga 10 mg. She no longer takes metformin . She uses a continuous glucose monitor, which she finds helpful. Under the care of Dr. Cherilyn  She experiences diabetic neuropathy and takes pregabalin , which is effective as long as she does not miss a dose. She has daily pain and sees a podiatrist for Charcot's  foot.   Trazodone  is taken for insomnia and seems to help her fall and stay asleep   She has chronic kidney disease stage 3B with proteinuria and secondary hyperparathyroidism, and she sees a nephrologist. Recent blood work showed a GFR of 33, and her proteinuria has resolved. Farxiga is taken for kidney protection but no longer taking ARB  She has congestive heart failure and takes metoprolol  and amlodipine  for hypertension. She experiences tightness around her chest when lying flat at night, which she attributes to reflux. Omeprazole  is taken for reflux.  She has major depressive disorder, managed with sertraline , Abilify , and bupropion  XL. She has good and bad days with her depression, with symptoms of overeating and feeling tired daily. Phq 9 improved but still positive   She has sleep apnea but does not actively manage it. She also has a history of spinal meningitis over a year ago,  which led to a seizure and subsequent rehabilitation.  She has vitamin D  deficiency and takes vitamin D  supplements. Rosuvastatin  is taken for hyperlipidemia, and she has peripheral arterial disease, for which she takes aspirin .  She has a planned knee surgery for kneecap replacement on September 8th, which was previously postponed due to uncontrolled blood sugar levels.   She takes statin therapy, zetia   but has been out of fish oil, she has dyslipidemia and also  atherosclerosis of aorta   She continues to struggle with her weight and BMI is still over 40, she is on GLP-1 agonist for Diabetes but does not seem to suppress her appetite   Patient Active Problem List   Diagnosis Date Noted   Secondary hyperparathyroidism of renal origin ([name]) 04/04/2024   CKD stage 3a, GFR 45-59 ml/min ([name]) 05/01/2023   Small vessel disease ([name]) 03/27/2023   Coronary artery disease involving native coronary artery of native heart without angina pectoris 03/16/2023   First degree AV block 03/16/2023   Seizure ([name]) 08/20/2022   Gouty arthritis 08/15/2022   Atherosclerosis of aorta ([name]) 07/04/2022   Insomnia due to other mental disorder 07/04/2022   Amputated toe of left foot ([name]) 07/04/2022   Chronic obstructive pulmonary disease ([name]) 01/28/2021   PAD (peripheral artery disease) ([name]) 12/03/2020   B12 deficiency 05/24/2020   Vitamin D  deficiency 05/24/2020   Metatarsalgia of left foot 01/15/2020   MDD (major depressive disorder), recurrent episode, moderate ([name]) 04/04/2019   (HFpEF) heart  failure with preserved ejection fraction ([name]) 08/31/2017   Moderate persistent asthma 08/31/2017   Dyslipidemia associated with type 2 diabetes mellitus ([name]) 06/14/2017   Charcot foot due to diabetes mellitus ([name]) 02/22/2016   Acquired abduction deformity of foot 07/12/2015   Osteoarthritis of subtalar joint 07/12/2015   Carpal tunnel syndrome 04/17/2015   Chronic constipation 04/17/2015   Stage 3b chronic  kidney disease ([name]) 04/17/2015   Decreased exercise tolerance 04/17/2015   Diabetes mellitus with polyneuropathy ([name]) 04/17/2015   Gastroesophageal reflux disease without esophagitis 04/17/2015   Bursitis, trochanteric 04/17/2015   Essential hypertension 04/17/2015   Hearing loss 04/17/2015   Chronic recurrent major depressive disorder ([name]) 04/17/2015   Morbid obesity with BMI of 40.0-44.9, adult ([name]) 04/17/2015   Hypo-ovarianism 04/17/2015   Perennial allergic rhinitis with seasonal variation 04/17/2015   Acne erythematosa 04/17/2015   Dyskinesia, tardive 04/17/2015   Impingement syndrome of shoulder 04/17/2015   Obstructive sleep apnea 05/14/2014   Mixed hyperlipidemia 02/06/2012   Low back pain 09/16/2008    Past Surgical History:  Procedure Laterality Date   ABDOMINAL HYSTERECTOMY  1975   AMPUTATION TOE Left 05/02/2023   Procedure: LEFT FOOT PARTIAL AMPUTATION 1st & 2nd TOE;  Surgeon: Silva Juliene SAUNDERS, DPM;  Location: [facility] ORS;  Service: Orthopedics/Podiatry;  Laterality: Left;   ANKLE SURGERY Left approx Jan 2018   APPENDECTOMY  1970   CATARACT EXTRACTION  01/2011   right   COLONOSCOPY WITH PROPOFOL  N/A 08/19/2019   Procedure: COLONOSCOPY WITH PROPOFOL ;  Surgeon: Therisa Bi, MD;  Location: [facility];  Service: Gastroenterology;  Laterality: N/A;   eye lid surgery  2013   bilateral   FOOT SURGERY     LOWER EXTREMITY ANGIOGRAPHY Left 11/23/2020   Procedure: LOWER EXTREMITY ANGIOGRAPHY;  Surgeon: Jama Cordella MATSU, MD;  Location: [facility] IN[facility]IVE CV [facility];  Service: Cardiovascular;  Laterality: Left;   NECK SURGERY     SPINE SURGERY     TEE WITHOUT CARDIOVERSION N/A 08/16/2022   Procedure: TRANSESOPHAGEAL ECHOCARDIOGRAM (TEE);  Surgeon: Darliss Rogue, MD;  Location: [facility] ORS;  Service: Cardiovascular;  Laterality: N/A;   TUBAL LIGATION     VAGINAL HYSTERECTOMY  1989    Family History  Problem Relation Age of Onset   Aneurysm Mother    Aortic aneurysm Mother     Hypertension Mother    Hyperlipidemia Mother    Heart disease Mother    Obesity Mother    Heart attack Maternal Grandfather    Diabetes Maternal Grandfather     Social History   Tobacco Use   Smoking status: Never   Smokeless tobacco: Never  Substance Use Topics   Alcohol use: No    Alcohol/week: 0.0 standard drinks of alcohol     Current Outpatient Medications:    acetaminophen  (TYLENOL  8 HOUR) 650 MG CR tablet, Take 1 tablet (650 mg total) by mouth every 8 (eight) hours as needed for pain., Disp: 90 tablet, Rfl: 2   amLODipine  (NORVASC ) 5 MG tablet, Take 1 tablet (5 mg total) by mouth daily., Disp: 90 tablet, Rfl: 0   ARIPiprazole  (ABILIFY ) 2 MG tablet, Take 1 tablet (2 mg total) by mouth every evening., Disp: 30 tablet, Rfl: 0   ASPIRIN  LOW DOSE 81 MG EC tablet, TAKE 1 TABLET (81 MG TOTAL) BY MOUTH DAILY., Disp: 30 tablet, Rfl: 0   B-D ULTRAFINE III SHORT PEN 31G X 8 MM MISC, , Disp: , Rfl:    buPROPion  (WELLBUTRIN  XL) 150 MG 24 hr tablet, Take 1  tablet (150 mg total) by mouth daily., Disp: 30 tablet, Rfl: 5   Cholecalciferol  (VITAMIN D3) 25 MCG (1000 UT) CAPS, Take 1,000 Units by mouth daily., Disp: , Rfl:    Continuous Blood Gluc Sensor (FREESTYLE LIBRE 2 SENSOR) MISC, FOR GLUCOSE MONITORING, Disp: , Rfl:    cyanocobalamin  (VITAMIN B12) 500 MCG tablet, Take 500 mcg by mouth daily., Disp: , Rfl:    ezetimibe  (ZETIA ) 10 MG tablet, Take 1 tablet (10 mg total) by mouth daily., Disp: 30 tablet, Rfl: 5   FARXIGA 10 MG TABS tablet, Take 10 mg by mouth every morning., Disp: , Rfl:    furosemide  (LASIX ) 20 MG tablet, Take 1 tablet (20 mg total) by mouth daily., Disp: 30 tablet, Rfl: 5   metoprolol  tartrate (LOPRESSOR ) 50 MG tablet, Take 0.5 tablets (25 mg total) by mouth 2 (two) times daily., Disp: 30 tablet, Rfl: 5   Misc Natural Products (OSTEO BI-FLEX ADV JOINT SHIELD PO), Take by mouth 2 (two) times daily., Disp: , Rfl:    omeprazole  (PRILOSEC) 40 MG capsule, Take 1 capsule (40 mg  total) by mouth daily., Disp: 90 capsule, Rfl: 0   pregabalin  (LYRICA ) 150 MG capsule, Take 1 capsule (150 mg total) by mouth 2 (two) times daily., Disp: 60 capsule, Rfl: 0   Probiotic Product (PROBIOTIC ADVANCED PO), Take 1 tablet by mouth daily., Disp: , Rfl:    rosuvastatin  (CRESTOR ) 40 MG tablet, Take 1 tablet (40 mg total) by mouth daily., Disp: 30 tablet, Rfl: 5   sertraline  (ZOLOFT ) 100 MG tablet, Take 1.5 tablets (150 mg total) by mouth daily., Disp: 135 tablet, Rfl: 0   traZODone  (DESYREL ) 100 MG tablet, Take 1 tablet (100 mg total) by mouth at bedtime as needed. for sleep, Disp: 30 tablet, Rfl: 0   TRESIBA FLEXTOUCH 200 UNIT/ML FlexTouch Pen, SMARTSIG:60 Unit(s) SUB-Q Daily, Disp: , Rfl:    VASCEPA  1 g capsule, Take 2 capsules by mouth twice daily., Disp: 120 capsule, Rfl: 0  Allergies  Allergen Reactions   Codeine Other (See Comments)    TRIPPED OUT  DIDN'T LIKE THE WAY IT FELT   Atorvastatin     muscle pain   Hydrocodone      itching   Tramadol     Latex Rash   Zolpidem  Other (See Comments)    Sleep walk    I personally reviewed active problem list, medication list, allergies with the patient/caregiver today.   ROS  Ten systems reviewed and is negative except as mentioned in HPI    Objective Physical Exam CONSTITUTIONAL: Patient appears well-developed and well-nourished. No distress. HEENT: Head atraumatic, normocephalic, neck supple. CARDIOVASCULAR: Normal rate, regular rhythm and normal heart sounds. No murmur heard. No BLE edema. PULMONARY: Effort normal and breath sounds normal. No respiratory distress. ABDOMINAL: There is no tenderness or distention. MUSCULOSKELETAL: Normal gait. Without gross motor or sensory deficit. PSYCHIATRIC: Patient has a normal mood and affect. Behavior is normal. Judgment and thought content normal.  Vitals:   04/04/24 1347  BP: 120/70  Pulse: 91  Resp: 16  SpO2: 97%  Weight: 231 lb (104.8 kg)  Height: 5' 2 (1.575 m)     Body mass index is 42.25 kg/m.  Recent Results (from the past 2160 hours)  Microalbumin, urine     Status: None   Collection Time: 03/27/24 12:00 AM  Result Value Ref Range   Microalb, Ur 3.4   CBC and differential     Status: None   Collection Time: 03/27/24 12:00 AM  Result Value Ref Range   Hemoglobin 12.8 12.0 - 16.0   HCT 39 36 - 46   Neutrophils Absolute 5,392.00    Platelets 191 150 - 400 K/uL   WBC 8.6   CBC     Status: None   Collection Time: 03/27/24 12:00 AM  Result Value Ref Range   RBC 4.34 3.87 - 5.11  HM DIABETES FOOT EXAM     Status: None   Collection Time: 03/27/24 12:00 AM  Result Value Ref Range   HM Diabetic Foot Exam Normal   Microalbumin / creatinine urine ratio     Status: None   Collection Time: 03/27/24 12:00 AM  Result Value Ref Range   Microalb Creat Ratio 57   Protein / creatinine ratio, urine     Status: None   Collection Time: 03/27/24 12:00 AM  Result Value Ref Range   Creatinine, Urine 60   Basic metabolic panel with GFR     Status: Abnormal   Collection Time: 03/27/24 12:00 AM  Result Value Ref Range   Glucose 128    BUN 34 (A) 4 - 21   CO2 28 (A) 13 - 22   Creatinine 1.4 (A) 0.5 - 1.1   Potassium 4.9 3.5 - 5.1 mEq/L   Sodium 140 137 - 147   Chloride 105 99 - 108  Comprehensive metabolic panel with GFR     Status: None   Collection Time: 03/27/24 12:00 AM  Result Value Ref Range   eGFR 39    Calcium  10.3 8.7 - 10.7  Hemoglobin A1c     Status: None   Collection Time: 03/27/24 12:00 AM  Result Value Ref Range   Hemoglobin A1C 7.7     Diabetic Foot Exam:     PHQ2/9:    04/04/2024    2:35 PM 10/23/2023    1:34 PM 07/19/2023   11:25 AM 06/18/2023    3:39 PM 05/17/2023   10:57 AM  Depression screen PHQ 2/9  Decreased Interest 1 3 0 0 0  Down, Depressed, Hopeless 1 3 0 0 0  PHQ - 2 Score 2 6 0 0 0  Altered sleeping 2 3 0 0 0  Tired, decreased energy 3 3 0 0 0  Change in appetite 0 3 0 0 0  Feeling bad or failure about  yourself  1 0 0 0 0  Trouble concentrating 0 3 0 0 0  Moving slowly or fidgety/restless 0 0 0 0 0  Suicidal thoughts 0 0 0 0 0  PHQ-9 Score 8 18 0 0 0  Difficult doing work/chores Somewhat difficult Very difficult   Not difficult at all    phq 9 is positive  Fall Risk:    10/23/2023    1:33 PM 07/19/2023   11:25 AM 06/18/2023    3:39 PM 05/17/2023   10:56 AM 05/01/2023   11:30 AM  Fall Risk   Falls in the past year? 0 1 1 1  0  Number falls in past yr: 0 1 1 1    Injury with Fall? 0 1 1 1    Risk for fall due to : No Fall Risks History of fall(s);Impaired balance/gait Impaired balance/gait Impaired balance/gait Impaired balance/gait  Follow up Falls prevention discussed;Education provided;Falls evaluation completed Falls prevention discussed;Education provided;Falls evaluation completed Falls prevention discussed Falls prevention discussed;Education provided;Falls evaluation completed Falls prevention discussed;Education provided;Falls evaluation completed     Assessment and Plan Assessment & Plan Type 2 diabetes mellitus with diabetic neuropathy and stage  3b chronic kidney disease Improved glycemic control with A1c reduced to 7.7%. Diabetic neuropathy controlled with pregabalin , requires dose adjustment. Stage 3b CKD with proteinuria managed well, Farxiga providing renal protection. - Continue Tresiba 85 units daily, Novolog  14 units plus sliding scale, Farxiga 10 mg daily. - Increase pregabalin  to 150 mg three times daily. - Monitor kidney function regularly. - Ensure all medications are sent to [facility].  Chronic pain due to diabetic neuropathy Partially controlled with pregabalin . - Increase pregabalin  to 150 mg three times daily.  Congestive heart failure with preserved ejection fraction Reports orthopnea, requires elevation at night. - Continue metoprolol  and furosemide . - Monitor symptoms and adjust treatment as necessary.  Hypertension Currently managed with  medication. - Continue amlodipine  and metoprolol .  Dyslipidemia Associated with type 2 diabetes, managed with rosuvastatin  and Zetia . Vascepa  previously discontinued, possibly due to cost but willing to resume it now  - Continue rosuvastatin  and Zetia . - Send Vascepa  prescription and assess affordability.  Peripheral arterial disease of lower extremities Managed with aspirin  and statin therapy. She states walks only short distances and not sure if she has pain / claudication  - Continue aspirin  and rosuvastatin .  Atherosclerosis of aorta Managed with statin therapy. - Continue rosuvastatin .  Morbid obesity (BMI >40) Current treatment with Ozempic  has not significantly curbed appetite but has helped with blood sugar control. - Continue Ozempic . - Encourage physical activity to aid weight loss.  Obstructive sleep apnea Sleep quality improved with trazodone . - Continue trazodone . - Encourage sleep hygiene practices.  Major depressive disorder, recurrent, mild to moderate PHQ-9 score improved from 18 to 8. Experiencing variable mood. - Continue sertraline , Abilify , and bupropion  XL.  Secondary hyperparathyroidism of renal origin Parathyroid  levels improved with current treatment. - Monitor parathyroid  levels regularly.  Vitamin D  deficiency  Gastroesophageal reflux disease Managed with omeprazole , reports occasional symptoms. - Continue omeprazole .

## 2024-04-28 ENCOUNTER — Other Ambulatory Visit: Payer: Self-pay | Admitting: Orthopedic Surgery

## 2024-05-06 ENCOUNTER — Other Ambulatory Visit: Payer: Self-pay

## 2024-05-06 ENCOUNTER — Encounter
Admission: RE | Admit: 2024-05-06 | Discharge: 2024-05-06 | Disposition: A | Discharge status: Home / Self Care | Source: Ambulatory Visit | Attending: Orthopedic Surgery | Admitting: Orthopedic Surgery

## 2024-05-06 VITALS — BP 153/60 | HR 76 | Resp 16 | Wt 234.8 lb

## 2024-05-06 DIAGNOSIS — Z01818 Encounter for other preprocedural examination: Secondary | ICD-10-CM | POA: Diagnosis not present

## 2024-05-06 DIAGNOSIS — Z01812 Encounter for preprocedural laboratory examination: Secondary | ICD-10-CM

## 2024-05-06 DIAGNOSIS — E119 Type 2 diabetes mellitus without complications: Secondary | ICD-10-CM

## 2024-05-06 HISTORY — DX: Chronic obstructive pulmonary disease, unspecified: J44.9

## 2024-05-06 HISTORY — DX: Other complications of anesthesia, initial encounter: T88.59XA

## 2024-05-06 HISTORY — DX: Personal history of urinary calculi: Z87.442

## 2024-05-06 HISTORY — DX: Heart failure, unspecified: I50.9

## 2024-05-06 LAB — URINALYSIS, ROUTINE W REFLEX MICROSCOPIC
Bacteria, UA: NONE SEEN
Bilirubin Urine: NEGATIVE
Glucose, UA: >=500 mg/dL — AB
Hgb urine dipstick: NEGATIVE
Ketones, ur: NEGATIVE mg/dL
Nitrite: NEGATIVE
Protein, ur: NEGATIVE mg/dL
Specific Gravity, Urine: 1.014 (ref 1.005–1.030)
pH: 5 (ref 5.0–8.0)

## 2024-05-06 LAB — COMPREHENSIVE METABOLIC PANEL WITH GFR
ALT: 21 U/L (ref 0–44)
AST: 22 U/L (ref 15–41)
Albumin: 4.5 g/dL (ref 3.5–5.0)
Alkaline Phosphatase: 58 U/L (ref 38–126)
Anion gap: 9 (ref 5–15)
BUN: 40 mg/dL — ABNORMAL HIGH (ref 8–23)
CO2: 26 mmol/L (ref 22–32)
Calcium: 10 mg/dL (ref 8.9–10.3)
Chloride: 106 mmol/L (ref 98–111)
Creatinine, Ser: 1.45 mg/dL — ABNORMAL HIGH (ref 0.44–1.00)
GFR, Estimated: 38 mL/min — ABNORMAL LOW (ref >60)
Glucose, Bld: 84 mg/dL (ref 70–99)
Potassium: 4.4 mmol/L (ref 3.5–5.1)
Sodium: 141 mmol/L (ref 135–145)
Total Bilirubin: 0.7 mg/dL (ref 0.0–1.2)
Total Protein: 7.2 g/dL (ref 6.5–8.1)

## 2024-05-06 LAB — CBC WITH DIFFERENTIAL/PLATELET
Abs Immature Granulocytes: 0.08 K/uL — ABNORMAL HIGH (ref 0.00–0.07)
Basophils Absolute: 0.1 K/uL (ref 0.0–0.1)
Basophils Relative: 1 %
Eosinophils Absolute: 0.3 K/uL (ref 0.0–0.5)
Eosinophils Relative: 3 %
HCT: 39.6 % (ref 36.0–46.0)
Hemoglobin: 13.2 g/dL (ref 12.0–15.0)
Immature Granulocytes: 1 %
Lymphocytes Relative: 22 %
Lymphs Abs: 2.2 K/uL (ref 0.7–4.0)
MCH: 29.9 pg (ref 26.0–34.0)
MCHC: 33.3 g/dL (ref 30.0–36.0)
MCV: 89.8 fL (ref 80.0–100.0)
Monocytes Absolute: 1 K/uL (ref 0.1–1.0)
Monocytes Relative: 10 %
Neutro Abs: 6.2 K/uL (ref 1.7–7.7)
Neutrophils Relative %: 63 %
Platelets: 204 K/uL (ref 150–400)
RBC: 4.41 MIL/uL (ref 3.87–5.11)
RDW: 14.6 % (ref 11.5–15.5)
WBC: 9.9 K/uL (ref 4.0–10.5)
nRBC: 0 % (ref 0.0–0.2)

## 2024-05-06 LAB — SURGICAL PCR SCREEN
MRSA, PCR: NEGATIVE
Staphylococcus aureus: POSITIVE — AB

## 2024-05-06 NOTE — Patient Instructions (Addendum)
 Your procedure is scheduled on: 05/19/24 - Monday Report to the Registration Desk on the 1st floor of the [facility]. To find out your arrival time, please call [phone] between 1PM - 3PM on: 05/16/24 - Friday If your arrival time is 6:00 am, do not arrive before that time as the [facility] entrance doors do not open until 6:00 am.  REMEMBER: Instructions that are not followed completely may result in serious medical risk, up to and including death; or upon the discretion of your surgeon and anesthesiologist your surgery may need to be rescheduled.  Do not eat food after midnight the night before surgery.  No gum chewing or hard candies.  You may however, drink CLEAR liquids up to 2 hours before you are scheduled to arrive for your surgery. Do not drink anything within 2 hours of your scheduled arrival time.  Clear liquids include: - water   In addition, your doctor has ordered for you to drink the provided:   Gatorade G2 Drinking this carbohydrate drink up to two hours before surgery helps to reduce insulin  resistance and improve patient outcomes. Please complete drinking 2 hours before scheduled arrival time.  One week prior to surgery: Stop Anti-inflammatories (NSAIDS) such as Advil, Aleve, Ibuprofen, Motrin, Naproxen, Naprosyn and Aspirin  based products such as Excedrin, Goody's Powder, BC Powder.  Stop beginning 09/01, ANY OVER THE COUNTER supplements until after surgery : VITAMIN D3 ,OSTEO BI-FLEX ,VITAMIN C    You may continue to take Tylenol  if needed for pain up until the day of surgery.  HOLD FARXIGA  beginning 09/06, may resume after surgery.  HOLD Metformin  beginning 09/07, may resume after surgery.  HOLD Ozempic  7 days prior to surgery, last dose 08/27, do not inject on or after 09/01, may resume after surgery.  insulin  aspart (NOVOLOG  FLEXPEN) hold on the morning of surgery, may resume with meals.  TRESIBA FLEXTOUCH hold this on the morning of surgery.  ON  THE DAY OF SURGERY ONLY TAKE THESE MEDICATIONS WITH SIPS OF WATER:  amLODipine  (NORVASC )  buPROPion  (WELLBUTRIN  XL)  ezetimibe  (ZETIA )  metoprolol  tartrate  omeprazole  (PRILOSEC)  pregabalin  (LYRICA )  rosuvastatin  (CRESTOR )  sertraline  (ZOLOFT )    No Alcohol for 24 hours before or after surgery.  No Smoking including e-cigarettes for 24 hours before surgery.  No chewable tobacco products for at least 6 hours before surgery.  No nicotine patches on the day of surgery.  Do not use any recreational drugs for at least a week (preferably 2 weeks) before your surgery.  Please be advised that the combination of cocaine and anesthesia may have negative outcomes, up to and including death. If you test positive for cocaine, your surgery will be cancelled.  On the morning of surgery brush your teeth with toothpaste and water, you may rinse your mouth with mouthwash if you wish. Do not swallow any toothpaste or mouthwash.  Use CHG Soap or wipes as directed on instruction sheet.  Do not wear jewelry, make-up, hairpins, clips or nail polish.  For welded (permanent) jewelry: bracelets, anklets, waist bands, etc.  Please have this removed prior to surgery.  If it is not removed, there is a chance that hospital personnel will need to cut it off on the day of surgery.  Do not wear lotions, powders, or perfumes.   Do not shave body hair from the neck down 48 hours before surgery.  Contact lenses, hearing aids and dentures may not be worn into surgery.  Do not bring valuables to the  hospital. [facility] is not responsible for any missing/lost belongings or valuables.   Notify your doctor if there is any change in your medical condition (cold, fever, infection).  Wear comfortable clothing (specific to your surgery type) to the hospital.  After surgery, you can help prevent lung complications by doing breathing exercises.  Take deep breaths and cough every 1-2 hours. Your doctor may order a  device called an Incentive Spirometer to help you take deep breaths.  When coughing or sneezing, hold a pillow firmly against your incision with both hands. This is called "splinting." Doing this helps protect your incision. It also decreases belly discomfort.  If you are being admitted to the hospital overnight, leave your suitcase in the car. After surgery it may be brought to your room.  In case of increased patient census, it may be necessary for you, the patient, to continue your postoperative care in the Same Day Surgery department.  If you are being discharged the day of surgery, you will not be allowed to drive home. You will need a responsible individual to drive you home and stay with you for 24 hours after surgery.   If you are taking public transportation, you will need to have a responsible individual with you.  Please call the Pre-admissions Testing Dept. at [phone] if you have any questions about these instructions.  Surgery Visitation Policy:  Patients having surgery or a procedure may have two visitors.  Children under the age of 19 must have an adult with them who is not the patient.  Inpatient Visitation:    Visiting hours are 7 a.m. to 8 p.m. Up to four visitors are allowed at one time in a patient room. The visitors may rotate out with other people during the day.  One visitor age 91 or older may stay with the patient overnight and must be in the room by 8 p.m.   Merchandiser, retail to address health-related social needs:  [email or web address]    Pre-operative 5 CHG Bath Instructions   You can play a key role in reducing the risk of infection after surgery. Your skin needs to be as free of germs as possible. You can reduce the number of germs on your skin by washing with CHG (chlorhexidine  gluconate) soap before surgery. CHG is an antiseptic soap that kills germs and continues to kill germs even after washing.   DO NOT use if you  have an allergy to chlorhexidine /CHG or antibacterial soaps. If your skin becomes reddened or irritated, stop using the CHG and notify one of our RNs at [phone].   Please shower with the CHG soap starting 4 days before surgery using the following schedule: 09/04 - 09/08.    Please keep in mind the following:  DO NOT shave, including legs and underarms, starting the day of your first shower.   You may shave your face at any point before/day of surgery.  Place clean sheets on your bed the day you start using CHG soap. Use a clean washcloth (not used since being washed) for each shower. DO NOT sleep with pets once you start using the CHG.   CHG Shower Instructions:  If you choose to wash your hair and private area, wash first with your normal shampoo/soap.  After you use shampoo/soap, rinse your hair and body thoroughly to remove shampoo/soap residue.  Turn the water OFF and apply about 3 tablespoons (45 ml) of CHG soap to a CLEAN washcloth.  Apply CHG soap ONLY FROM  YOUR NECK DOWN TO YOUR TOES (washing for 3-5 minutes)  DO NOT use CHG soap on face, private areas, open wounds, or sores.  Pay special attention to the area where your surgery is being performed.  If you are having back surgery, having someone wash your back for you may be helpful. Wait 2 minutes after CHG soap is applied, then you may rinse off the CHG soap.  Pat dry with a clean towel  Put on clean clothes/pajamas   If you choose to wear lotion, please use ONLY the CHG-compatible lotions on the back of this paper.     Additional instructions for the day of surgery: DO NOT APPLY any lotions, deodorants, cologne, or perfumes.   Put on clean/comfortable clothes.  Brush your teeth.  Ask your nurse before applying any prescription medications to the skin.      CHG Compatible Lotions   Aveeno Moisturizing lotion  Cetaphil Moisturizing Cream  Cetaphil Moisturizing Lotion  Clairol Herbal Essence Moisturizing Lotion,  Dry Skin  Clairol Herbal Essence Moisturizing Lotion, Extra Dry Skin  Clairol Herbal Essence Moisturizing Lotion, Normal Skin  Curel Age Defying Therapeutic Moisturizing Lotion with Alpha Hydroxy  Curel Extreme Care Body Lotion  Curel Soothing Hands Moisturizing Hand Lotion  Curel Therapeutic Moisturizing Cream, Fragrance-Free  Curel Therapeutic Moisturizing Lotion, Fragrance-Free  Curel Therapeutic Moisturizing Lotion, Original Formula  Eucerin Daily Replenishing Lotion  Eucerin Dry Skin Therapy Plus Alpha Hydroxy Crme  Eucerin Dry Skin Therapy Plus Alpha Hydroxy Lotion  Eucerin Original Crme  Eucerin Original Lotion  Eucerin Plus Crme Eucerin Plus Lotion  Eucerin TriLipid Replenishing Lotion  Keri Anti-Bacterial Hand Lotion  Keri Deep Conditioning Original Lotion Dry Skin Formula Softly Scented  Keri Deep Conditioning Original Lotion, Fragrance Free Sensitive Skin Formula  Keri Lotion Fast Absorbing Fragrance Free Sensitive Skin Formula  Keri Lotion Fast Absorbing Softly Scented Dry Skin Formula  Keri Original Lotion  Keri Skin Renewal Lotion Keri Silky Smooth Lotion  Keri Silky Smooth Sensitive Skin Lotion  Nivea Body Creamy Conditioning Oil  Nivea Body Extra Enriched Lotion  Nivea Body Original Lotion  Nivea Body Sheer Moisturizing Lotion Nivea Crme  Nivea Skin Firming Lotion  NutraDerm 30 Skin Lotion  NutraDerm Skin Lotion  NutraDerm Therapeutic Skin Cream  NutraDerm Therapeutic Skin Lotion  ProShield Protective Hand Cream  Provon moisturizing lotion  How to Use an Incentive Spirometer  An incentive spirometer is a tool that measures how well you are filling your lungs with each breath. Learning to take long, deep breaths using this tool can help you keep your lungs clear and active. This may help to reverse or lessen your chance of developing breathing (pulmonary) problems, especially infection. You may be asked to use a spirometer: After a surgery. If you have a  lung problem or a history of smoking. After a long period of time when you have been unable to move or be active. If the spirometer includes an indicator to show the highest number that you have reached, your health care provider or respiratory therapist will help you set a goal. Keep a log of your progress as told by your health care provider. What are the risks? Breathing too quickly may cause dizziness or cause you to pass out. Take your time so you do not get dizzy or light-headed. If you are in pain, you may need to take pain medicine before doing incentive spirometry. It is harder to take a deep breath if you are having pain. How to use  your incentive spirometer  Sit up on the edge of your bed or on a chair. Hold the incentive spirometer so that it is in an upright position. Before you use the spirometer, breathe out normally. Place the mouthpiece in your mouth. Make sure your lips are closed tightly around it. Breathe in slowly and as deeply as you can through your mouth, causing the piston or the ball to rise toward the top of the chamber. Hold your breath for 3-5 seconds, or for as long as possible. If the spirometer includes a coach indicator, use this to guide you in breathing. Slow down your breathing if the indicator goes above the marked areas. Remove the mouthpiece from your mouth and breathe out normally. The piston or ball will return to the bottom of the chamber. Rest for a few seconds, then repeat the steps 10 or more times. Take your time and take a few normal breaths between deep breaths so that you do not get dizzy or light-headed. Do this every 1-2 hours when you are awake. If the spirometer includes a goal marker to show the highest number you have reached (best effort), use this as a goal to work toward during each repetition. After each set of 10 deep breaths, cough a few times. This will help to make sure that your lungs are clear. If you have an incision on your chest  or abdomen from surgery, place a pillow or a rolled-up towel firmly against the incision when you cough. This can help to reduce pain while taking deep breaths and coughing. General tips When you are able to get out of bed: Walk around often. Continue to take deep breaths and cough in order to clear your lungs. Keep using the incentive spirometer until your health care provider says it is okay to stop using it. If you have been in the hospital, you may be told to keep using the spirometer at home. Contact a health care provider if: You are having difficulty using the spirometer. You have trouble using the spirometer as often as instructed. Your pain medicine is not giving enough relief for you to use the spirometer as told. You have a fever. Get help right away if: You develop shortness of breath. You develop a cough with bloody mucus from the lungs. You have fluid or blood coming from an incision site after you cough. Summary An incentive spirometer is a tool that can help you learn to take long, deep breaths to keep your lungs clear and active. You may be asked to use a spirometer after a surgery, if you have a lung problem or a history of smoking, or if you have been inactive for a long period of time. Use your incentive spirometer as instructed every 1-2 hours while you are awake. If you have an incision on your chest or abdomen, place a pillow or a rolled-up towel firmly against your incision when you cough. This will help to reduce pain. Get help right away if you have shortness of breath, you cough up bloody mucus, or blood comes from your incision when you cough. This information is not intended to replace advice given to you by your health care provider. Make sure you discuss any questions you have with your health care provider. Document Revised: 11/17/2019 Document Reviewed: 11/17/2019 Elsevier Patient Education  2023 Elsevier Inc.  Class available on 09/04 :     Preoperative  Educational Videos for Total Hip, Knee and Shoulder Replacements  To better prepare  for surgery, please view our videos that explain the physical activity and discharge planning required to have the best surgical recovery at [facility].  [email or web address]  Questions? Call [phone] or email [email or web address]      POLAR CARE INFORMATION  MassAdvertisement.[email or web address]  How to use Breg Polar Care [facility] Therapy System?  YouTube   [email or web address].co.uk  OPERATING INSTRUCTIONS  Start the product With dry hands, connect the transformer to the electrical connection located on the top of the cooler. Next, plug the transformer into an appropriate electrical outlet. The unit will automatically start running at this point.  To stop the pump, disconnect electrical power.  Unplug to stop the product when not in use. Unplugging the Polar Care unit turns it off. Always unplug immediately after use. Never leave it plugged in while unattended. Remove pad.    FIRST ADD WATER TO FILL LINE, THEN ICE---Replace ice when existing ice is almost melted  1 Discuss Treatment with your Licensed Health Care Practitioner and Use Only as Prescribed 2 Apply Insulation Barrier & Cold Therapy Pad 3 Check for Moisture 4 Inspect Skin Regularly  Tips and Trouble Shooting Usage Tips 1. Use cubed or chunked ice for optimal performance. 2. It is recommended to drain the Pad between uses. To drain the pad, hold the Pad upright with the hose pointed toward the ground. Depress the black plunger and allow water to drain out. 3. You may disconnect the Pad from the unit without removing the pad from the affected area by depressing the silver  tabs on the hose coupling and gently pulling the hoses apart. The Pad and unit will seal itself and will not leak. Note: Some dripping during release is normal. 4. DO NOT RUN  PUMP WITHOUT WATER! The pump in this unit is designed to run with water. Running the unit without water will cause permanent damage to the pump. 5. Unplug unit before removing lid.  TROUBLESHOOTING GUIDE Pump not running, Water not flowing to the pad, Pad is not getting cold 1. Make sure the transformer is plugged into the wall outlet. 2. Confirm that the ice and water are filled to the indicated levels. 3. Make sure there are no kinks in the pad. 4. Gently pull on the blue tube to make sure the tube/pad junction is straight. 5. Remove the pad from the treatment site and ll it while the pad is lying at; then reapply. 6. Confirm that the pad couplings are securely attached to the unit. Listen for the double clicks (Figure 1) to confirm the pad couplings are securely attached.  Leaks    Note: Some condensation on the lines, controller, and pads is unavoidable, especially in warmer climates. 1. If using a Breg Polar Care Cold Therapy unit with a detachable Cold Therapy Pad, and a leak exists (other than condensation on the lines) disconnect the pad couplings. Make sure the silver  tabs on the couplings are depressed before reconnecting the pad to the pump hose; then confirm both sides of the coupling are properly clicked in. 2. If the coupling continues to leak or a leak is detected in the pad itself, stop using it and call Breg Customer Care at [phone].  Cleaning After use, empty and dry the unit with a soft cloth. Warm water and mild detergent may be used occasionally to clean the pump and tubes.  WARNING: The Polar Care Cube can be cold enough to cause serious injury, including full skin necrosis. Follow these Operating Instructions, and carefully  read the Product Insert (see pouch on side of unit) and the Cold Therapy Pad Fitting Instructions (provided with each Cold Therapy Pad) prior to use.

## 2024-05-07 ENCOUNTER — Telehealth: Payer: Self-pay

## 2024-05-07 NOTE — Telephone Encounter (Signed)
-----   Message from Summer CHARLENA [name] sent at 05/06/2024 12:34 PM EDT ----- Regarding: Request for pre-operative cardiac clearance Request for pre-operative cardiac clearance:  1. What type of surgery is being performed?  ARTHROPLASTY, KNEE, TOTAL  2. When is this surgery scheduled?  05/19/2024  3. Type of clearance being requested (medical, pharmacy, both)? Medical   4. Are there any medications that need to be held prior to surgery? N/A - surgeon has cleared patient to continue their daily LOW DOSE ASPIRIN  throughout the perioperative course. Dose will be held on the day of surgery only.   5. Practice name and name of physician performing surgery?  Performing surgeon: Dr. Arthea Sheer, MD  Requesting clearance: Summer Pereyra, FNP-C    6. Anesthesia type (none, local, MAC, general)? GENERAL  7. What is the office phone and fax number?   Phone: [phone] Fax: [phone] (In efforts to conserve resources (paper), return FAX not required. I will follow up in [facility]).  ATTENTION: Unable to create telephone message as per your standard workflow. Directed by [facility] providers to send requests for cardiac clearance to this pool for appropriate distribution to provider covering pre-operative clearances.   Summer Pereyra, MSN, APRN, FNP-C, CEN [facility]  Peri-operative Services Nurse Practitioner Phone: [phone] 05/06/24 12:34 PM

## 2024-05-07 NOTE — Telephone Encounter (Signed)
 Preop OV now scheduled per patient preference in [place]. Offered patient avail appt on 9/2 but states that she doesn't have transportation this far. Patient agreed to appt date and time.

## 2024-05-07 NOTE — Telephone Encounter (Signed)
   Pre-operative Risk Assessment    Patient Name: Summer [name]  DOB: Nov 16, 1950 MRN: [id number]   Date of last office visit: 03/16/23 [facility] END, MD Date of next office visit: NONE   Request for Surgical Clearance    Procedure:  ARTHROPLASTY, KNEE, TOTAL  Date of Surgery:  Clearance 05/19/24                                Surgeon:  Dr. Arthea Sheer, MD  Surgeon's Group or Practice Name:  [facility]  Phone number:  [phone]  Fax number:  [phone] (In efforts to conserve resources (paper), return FAX not required. I will follow up in [facility]).    Type of Clearance Requested:   - Medical  - Pharmacy:  Hold Aspirin  N/A - surgeon has cleared patient to continue their daily LOW DOSE ASPIRIN  throughout the perioperative course. Dose will be held on the day of surgery only.    Type of Anesthesia:  General    Additional requests/questions:    Signed, Lucie DELENA Ku   05/07/2024, 8:29 AM      Elnor Dorise BRAVO, NP  P Cv Div Preop Callback Request for pre-operative cardiac clearance:   1. What type of surgery is being performed? ARTHROPLASTY, KNEE, TOTAL  2. When is this surgery scheduled? 05/19/2024   3. Type of clearance being requested (medical, pharmacy, both)? Medical   4. Are there any medications that need to be held prior to surgery? N/A - surgeon has cleared patient to continue their daily LOW DOSE ASPIRIN  throughout the perioperative course. Dose will be held on the day of surgery only.  5. Practice name and name of physician performing surgery? Performing surgeon: Dr. Arthea Sheer, MD Requesting clearance: Dorise Elnor, FNP-C     6. Anesthesia type (none, local, MAC, general)? GENERAL  7. What is the office phone and fax number?   Phone: [phone] Fax: [phone] (In efforts to conserve resources (paper), return FAX not required. I will follow up in [facility]).  ATTENTION: Unable to create telephone message as per your  standard workflow. Directed by [facility] providers to send requests for cardiac clearance to this pool for appropriate distribution to provider covering pre-operative clearances.  Dorise Elnor, MSN, APRN, FNP-C, CEN [facility] Peri-operative Services Nurse Practitioner Phone: [phone] 05/06/24 12:34 PM

## 2024-05-07 NOTE — Telephone Encounter (Signed)
   Name: Summer [name]  DOB: 1950/11/20  MRN: [id number]  Primary Cardiologist: [name], MD  Chart reviewed as part of pre-operative protocol coverage. Because of [name]'s past medical history and time since last visit, she will require a follow-up in-office visit in order to better assess preoperative cardiovascular risk.  Pre-op covering staff: - Please schedule appointment and call patient to inform them. If patient already had an upcoming appointment within acceptable timeframe, please add pre-op clearance to the appointment notes so provider is aware. - Please contact requesting surgeon's office via preferred method (i.e, phone, fax) to inform them of need for appointment prior to surgery.  Wyn Raddle, Jackee Shove, NP  05/07/2024, 11:53 AM

## 2024-05-08 ENCOUNTER — Ambulatory Visit: Attending: Cardiology | Admitting: Cardiology

## 2024-05-08 ENCOUNTER — Encounter: Payer: Self-pay | Admitting: Cardiology

## 2024-05-08 VITALS — BP 117/67 | HR 73 | Ht 62.0 in | Wt 236.0 lb

## 2024-05-08 DIAGNOSIS — I5032 Chronic diastolic (congestive) heart failure: Secondary | ICD-10-CM | POA: Diagnosis not present

## 2024-05-08 DIAGNOSIS — E785 Hyperlipidemia, unspecified: Secondary | ICD-10-CM

## 2024-05-08 DIAGNOSIS — I10 Essential (primary) hypertension: Secondary | ICD-10-CM

## 2024-05-08 DIAGNOSIS — E1169 Type 2 diabetes mellitus with other specified complication: Secondary | ICD-10-CM

## 2024-05-08 DIAGNOSIS — I739 Peripheral vascular disease, unspecified: Secondary | ICD-10-CM | POA: Diagnosis not present

## 2024-05-08 DIAGNOSIS — Z01810 Encounter for preprocedural cardiovascular examination: Secondary | ICD-10-CM

## 2024-05-08 DIAGNOSIS — N184 Chronic kidney disease, stage 4 (severe): Secondary | ICD-10-CM | POA: Diagnosis not present

## 2024-05-08 DIAGNOSIS — I2510 Atherosclerotic heart disease of native coronary artery without angina pectoris: Secondary | ICD-10-CM | POA: Diagnosis not present

## 2024-05-08 NOTE — Patient Instructions (Signed)
 Medication Instructions:  No changes at this time.  *If you need a refill on your cardiac medications before your next appointment, please call your pharmacy*  Lab Work: None  If you have labs (blood work) drawn today and your tests are completely normal, you will receive your results only by: MyChart Message (if you have MyChart) OR A paper copy in the mail If you have any lab test that is abnormal or we need to change your treatment, we will call you to review the results.  Testing/Procedures: None  Follow-Up: At [facility], you and your health needs are our priority.  As part of our continuing mission to provide you with exceptional heart care, our providers are all part of one team.  This team includes your primary Cardiologist (physician) and Advanced Practice Providers or APPs (Physician Assistants and Nurse Practitioners) who all work together to provide you with the care you need, when you need it.  Your next appointment:   1 year(s)  Provider:   [name], MD or Tylene Lunch, NP

## 2024-05-08 NOTE — Progress Notes (Signed)
 Cardiology Office Note   Date:  05/08/2024  ID:  Summer [name], DOB 1950/11/23, MRN [id number] PCP: Sowles, Krichna, MD  [facility] Providers Cardiologist:  [name], MD     History of Present Illness Summer [name] is a 73 y.o. female with a past medical history of chronic HFpEF, hyperlipidemia, type 2 diabetes complicated by peripheral neuropathy and Charcot foot, asthma, obstructive sleep apnea, and morbid obesity, who presents for follow-up and preoperative cardiovascular examination.   Patient recently establish care with Dr. Hanson 07/2019 for preoperative evaluation in anticipation of bariatric surgery.  She has longstanding exertional dyspnea with minimal activity.  Echocardiogram revealed normal LVEF.  Due to EKG with septal Q waves pharmacological myocardial PET/CT was completed at [facility] which showed only mild coronary artery calcification.  She was seen in clinic 02/11/2020 and reported chronic exertional dyspnea at her baseline with no chest pain or palpitations.  She was still awaiting clearance due to financial constraints for bariatric surgery.  She had a chest x-ray that was completed 08/17/2020 at an outside facility with findings indicating borderline cardiomegaly, bibasilar parabronchial changes.  She was treated with albuterol  and azithromycin .  She was seen in clinic 02/24/2022 and reported she was having a bad cough that has affected her voice.  She was seeing her PCP.  Cough is getting better.  She had dull chest pressure and sharp pain that was chronic, not related to coughing.  Chronic shortness of breath that is unchanged.  Obstructive sleep apnea not on CPAP and she was not interested in revisiting CPAP at that time.  There were no medication changes that were made or further testing that was ordered.   She was hospitalized in 08/2022 with Streptococcus pneumonia bacteremia.  Underwent TEE that was negative for endocarditis.  Hospital course was complicated by a seizure  with MRI findings consistent with PRES   She was last seen in clinic 03/16/2023 by Dr. Hanson.  At that time she continued with mild shortness of breath with activity for which she intermittently used her inhaler.  She denied any other associated symptoms.  There were no medication changes that were made and no further testing that was ordered at that time.  She returns to clinic today stating that she has been doing fairly well from a cardiac perspective.  She denies any chest pain, worsening shortness of breath, lightheadedness/dizziness, or palpitations.  She is in a wheelchair today as she has required surgical clearance for right knee replacement.  Stated that previously this year she was in the hospital with spinal meningitis and then had to go to rehab.  Since that time she has been doing well but with increasing activity and is scheduled to have her knee done.  She has mild shortness of breath with activity for which she intermittently continues to use her inhaler.  States that she has been compliant with her current medication regimen without any adverse side effects.  ROS: 10 point review of systems has been reviewed and considered negative except ones been listed in the HPI  Studies Reviewed      TEE (08/16/2022): Normal LVEF (60-65%).  Normal RV size and function.  No left atrial appendage/left atrial thrombus.  Normal RV size without thrombus.  No pericardial effusion.  Normal mitral valve with trivial regurgitation.  Normal tricuspid valve.  Normal aortic valve.  Normal pulmonic valve.  No atrial level shunt by color Doppler.  No vegetation identified.   TTE (08/14/2022): Normal LV size with  moderate asymmetric LVH of the septum.  LVEF 60-65%.  Indeterminate diastolic parameters.  Normal RV size and function.  Normal biatrial size.  Mild thickening and moderate calcification of the mitral valve leaflets.  Moderate mitral annular calcification.  No mitral regurgitation or stenosis.  Normal  tricuspid valve.  Aortic sclerosis without stenosis or regurgitation.   Myocardial PET/CT (07/23/2019, [facility]): Low risk study without ischemia or scar.  LVEF greater than 65%.  Mild coronary artery calcification noted.  Mitral annular calcification present.  Risk Assessment/Calculations           Physical Exam VS:  BP 117/67   Pulse 73   Ht 5' 2 (1.575 m)   Wt 236 lb (107 kg)   SpO2 95%   BMI 43.16 kg/m        Wt Readings from Last 3 Encounters:  05/08/24 236 lb (107 kg)  05/06/24 234 lb 12.6 oz (106.5 kg)  04/04/24 231 lb (104.8 kg)    GEN: Well nourished, well developed in no acute distress NECK: No JVD; No carotid bruits CARDIAC: RRR, no murmurs, rubs, gallops RESPIRATORY:  Clear to auscultation without rales, wheezing or rhonchi  ABDOMEN: Soft, non-tender, non-distended EXTREMITIES: Trace pretibial edema; No deformity   ASSESSMENT AND PLAN Chronic HFpEF where she has stable exertional dyspnea that has not changed over several years.  Her weight has remained stable.  She appears to be euvolemic on exam.  She is continued on Farxiga  10 mg daily, furosemide  20 mg daily.  Coronary artery calcification she continues to deny chest pain.  EKG that was done previously preoperative testing was unchanged.  She had a TTE/TEE in 08/2022 to did not show obvious wall motion abnormalities.  Myocardial PET/[facility] in 2020 was without ischemia or scar.  She continues to deny any chest discomfort.  She is continued on aspirin  81 mg daily and rosuvastatin  40 mg daily.  No further ischemic workup needed at this time as he continues to remain asymptomatic.  Peripheral arterial disease where she reports to have lost to wound to her foot prior angiograms with vascular surgery have shown predominantly runoff disease which will continue to be medically managed.  Hyperlipidemia associated with type 2 diabetes.  She is continued on her dose of rosuvastatin , ezetimibe , and Vascepa .  She is due for  repeat lipid panel later on this year with her PCP.  Ongoing management of her type 2 diabetes by her primary care provider.  Hypertension with a blood pressure today 117/67.  Blood pressures have remained stable.  She is continued on metoprolol  titrate 25 mg twice daily and amlodipine  5 mg daily.  She has been encouraged to continue to monitor her blood pressures 1 to 2 hours postmedication administration at home as well.  Chronic kidney disease stage IV with last serum creatinine of 1.45 which is stable at baseline.  She continues to follow with nephrology.  Encouraged to continue to maintain adequate hydration and avoid NSAIDs and nephrotoxic agents were able  Preoperative cardiovascular examination    Ms. Finamore perioperative risk of a major cardiac event is 6.6% according to the Revised Cardiac Risk Index (RCRI).  Therefore, she is at high risk for perioperative complications.   Her functional capacity is fair at 4.64 METs according to the Duke Activity Status Index (DASI). Recommendations: According to ACC/AHA guidelines, no further cardiovascular testing needed.  The patient may proceed to surgery at acceptable risk.   Antiplatelet and/or Anticoagulation Recommendations: The patient should remain on Aspirin  without  interruption.          Dispo: Patient to return to clinic to see MD/APP in 11 to 12 months or sooner if needed for further follow-up  Signed, Nason Conradt, NP

## 2024-05-14 DIAGNOSIS — M1712 Unilateral primary osteoarthritis, left knee: Secondary | ICD-10-CM | POA: Diagnosis not present

## 2024-05-14 NOTE — Progress Notes (Signed)
 History of Present Illness: The patient is an 73 y.o. female seen in clinic today for follow-up evaluation of her left knee.  Patient has severe pain in the anterior medial aspect of her left knee which is limiting function.  Reports pain up to an 8 out of 10 which prevent her from walking with severe stiffness and instability sensations in the left knee to prevent her from bearing weight.  She is functionally limited due to her left knee and despite conservative measures including pain control with medications and injections in the past.  At this point she is ready for more permanent solution for her left knee arthritis and is here today to discuss moving forward with knee replacement.  The patient denies fevers, chills, numbness, tingling, shortness of breath, chest pain, recent illness, or any trauma.  Patient is a non-smoker, diabetic with an A1c of 7.7, with a BMI of 42  Past Medical History: Past Medical History:  Diagnosis Date  . Charcot arthropathy   . Hypercholesteremia   . Hypertension   . OSA (obstructive sleep apnea)   . Type 2 diabetes mellitus (CMS/HHS-[name])     Past Surgical History: Past Surgical History:  Procedure Laterality Date  . HYSTERECTOMY SUPRACERVICAL ABDOMINAL W/REMOVAL TUBES &/OR OVARIES  1974  . APPENDECTOMY  1985  . foot surgery Left 10/06/2016   'tried to straighten out my foot'  . abdominal hernia    . SPINE SURGERY  1980's   cervical decompression    Past Family History: Family History  Problem Relation Age of Onset  . Aortic aneurysm Mother   . Myocardial Infarction (Heart attack) Maternal Grandfather   . Diabetes Maternal Grandfather   . No Known Problems Father   . No Known Problems Maternal Grandmother   . No Known Problems Paternal Grandmother   . No Known Problems Paternal Grandfather     Medications: Current Outpatient Medications  Medication Sig Dispense Refill  . acetaminophen  (TYLENOL ) 650 MG ER tablet Take 1,300 mg by mouth every 8  (eight) hours as needed for Pain    . alcohol swabs (ALCOHOL PADS) PadM Use to clean the skin prior to insulin  injections and prior to testing blood glucose 800 each 3  . amLODIPine  (NORVASC ) 5 MG tablet Take 5 mg by mouth once daily       . ARIPiprazole  (ABILIFY ) 2 MG tablet Take 2 mg by mouth once daily    . ascorbic acid , vitamin C , (VITAMIN C ) 1000 MG tablet Take 1,000 mg by mouth once daily    . aspirin  81 MG EC tablet Take 81 mg by mouth once daily.      . blood-glucose sensor (FREESTYLE LIBRE 3 PLUS SENSOR) Devi Use 1 each every 15 (fifteen) days    . buPROPion  (WELLBUTRIN  XL) 150 MG XL tablet Take 1 tablet by mouth once daily    . cholecalciferol  (VITAMIN D3) 1000 unit tablet Take 1 capsule by mouth once daily    . cyanocobalamin  (VITAMIN B12) 500 MCG tablet Take 500 mcg by mouth once daily (Patient not taking: Reported on 03/27/2024)    . diphenhydrAMINE  (BENADRYL ) 25 mg capsule Take 25 mg by mouth every 6 (six) hours as needed    . ezetimibe  (ZETIA ) 10 mg tablet Take 1 tablet by mouth once daily    . FARXIGA  10 mg tablet Take 10 mg by mouth once daily    . flash glucose sensor (FREESTYLE LIBRE 2 SENSOR) Kit FOR GLUCOSE MONITORING (Patient not taking: Reported on 03/27/2024)  6 each 1  . FUROsemide  (LASIX ) 20 MG tablet Take 1 tablet (20 mg total) by mouth daily.    SABRA glucosamine/chondr su A sod (OSTEO BI-FLEX ORAL) Take by mouth    . icosapent  ethyL (VASCEPA ) 1 gram capsule Take 2 g by mouth 2 (two) times daily    . insulin  ASPART (NOVOLOG  FLEXPEN) pen injector (concentration 100 units/mL) 10 units plus sliding scale with each meal.  Max daily dose 50 units. (Patient taking differently: 12 Units 10 units plus sliding scale with each meal.  Max daily dose 50 units.) 15 mL 12  . L. rhamnosus GG/inulin (CULTURELLE PROBIOTICS ORAL) Take by mouth once daily    . lancets Use 1 each 4 (four) times daily Use as instructed. 400 each 6  . metoprolol  TARTrate (LOPRESSOR ) 50 MG tablet Take 50 mg by  mouth 2 (two) times daily    . omeprazole  (PRILOSEC) 40 MG DR capsule Take 40 mg by mouth once daily.      . pen needle, diabetic (PEN NEEDLE) 31 gauge x 5/16 needle Use one each five times daily 200 each 11  . pregabalin  (LYRICA ) 150 MG capsule Take 1 capsule by mouth twice daily 60 capsule 5  . rosuvastatin  (CRESTOR ) 40 MG tablet Take 1 tablet (40 mg total) by mouth once daily 90 tablet 3  . semaglutide  (OZEMPIC ) 1 mg/dose (4 mg/3 mL) pen injector Inject 0.75 mLs (1 mg total) subcutaneously every 7 (seven) days 3 mL 12  . sertraline  (ZOLOFT ) 100 MG tablet Take 1 tablet (100 mg total) by mouth once daily 30 tablet 11  . traZODone  (DESYREL ) 100 MG tablet Take 100 mg by mouth at bedtime as needed    . TRESIBA FLEXTOUCH U-200 pen injector (concentration 200 units/mL) Inject 100 Units subcutaneously once daily (Patient taking differently: Inject 100 Units subcutaneously once daily 78 units) 18 mL 12   No current facility-administered medications for this visit.    Allergies: Allergies  Allergen Reactions  . Codeine Other (See Comments) and Unknown    'feels out of it'   Other reaction(s): Other (See Comments), Unknown  TRIPPED OUT  DIDN'T LIKE THE WAY IT FELT  I trip out  TRIPPED OUT  DIDN'T LIKE THE WAY IT FELT    I trip out    'feels out of it'  Other reaction(s): Other (See Comments), Unknown TRIPPED OUT  DIDN'T LIKE THE WAY IT FELT 'feels out of it'  I trip out TRIPPED OUT  DIDN'T LIKE THE WAY IT FELT    Other reaction(s): Other (See Comments), Unknown TRIPPED OUT  DIDN'T LIKE THE WAY IT FELT 'feels out of it'  I trip out TRIPPED OUT  DIDN'T LIKE THE WAY IT FELT  . Atorvastatin Other (See Comments)    muscle pain  Other reaction(s): Other (See Comments)  muscle pain    muscle pain Other reaction(s): Other (See Comments) muscle pain muscle pain muscle pain    Other reaction(s): Other (See Comments) muscle pain muscle pain muscle pain  . Latex, Natural  Rubber Rash  . Hydrocodone  Other (See Comments)  . Tramadol  Other (See Comments)  . Latex Rash  . Zolpidem  Other (See Comments)    Sleep walk  Other reaction(s): Other (See Comments), Other (See Comments)  I do things in my sleep that I dont remember  Sleep walk    I do things in my sleep that I dont remember    Sleep walk Other reaction(s): Other (See Comments), Other (See Comments)  Sleep walk Sleep walk I do things in my sleep that I dont remember Sleep walk    Other reaction(s): Other (See Comments), Other (See Comments) Sleep walk Sleep walk I do things in my sleep that I dont remember Sleep walk     Visit Vitals: Vitals:   05/14/24 1147  BP: (!) 128/90     Review of Systems:  A comprehensive 14 point ROS was performed, reviewed, and the pertinent orthopaedic findings are documented in the HPI.  Physical Exam: General/Constitutional: No apparent distress: well-nourished and well developed. Eyes: Pupils equal, round with synchronous movement. Pulmonary exam: Lungs clear to auscultation bilaterally no wheezing rales or rhonchi Cardiac exam: Regular rate and rhythm no obvious murmurs rubs or gallops. Integumentary: No impressive skin lesions present, except as noted in detailed exam. Neuro/Psych: Normal mood and affect, oriented to person, place and time.  Comprehensive Knee Exam: Gait Unable to ambulate due to severe pain in the left knee  Alignment Neutral    Inspection   Left  Skin Normal appearance with no obvious deformity.  No ecchymosis or erythema.  Soft Tissue No focal soft tissue swelling  Quad Atrophy None    Palpation    Left  Tenderness Medial joint line tenderness to palpation which reproduces pain  Crepitus + patellofemoral and tibiofemoral crepitus  Effusion None    Range of Motion   Left  Flexion  10-100  Extension  10 degree flexion contracture not correctable    Ligamentous Exam   Left  Lachman Normal  Valgus 0 Normal  Valgus  30 Normal  Varus 0 Normal  Varus 30 Normal  Anterior Drawer Normal  Posterior Drawer Normal    Meniscal Exam   Left  Hyperflexion Test Positive  Hyperextension Test Positive  McMurray's Positive                Neurovascular   Left  Quadriceps Strength 5/5  Hamstring Strength 5/5  Hip Abductor Strength 4/5  Distal Motor Normal  Distal Sensory Normal light touch sensation  Distal Pulses Normal       Imaging Studies: I have reviewed AP, lateral,sunrise, and flexed PA weight bearing knee X-rays (4 views) of the left knee ordered and taken today in the office show severe degenerative changes with bone-on-bone articulation medially and tricompartmental osteophyte formation and sclerosis and anterior subluxation of the tibia relative to the femur.  Overall Lawrence grade 4.  AP, sunrise, and flexed PA of the right knee also show severe degenerative changes with tricompartmental osteophyte formation sclerosis and medial bone-on-bone articulation.  Kellgren-Lawrence grade 4.  No fractures or dislocations noted in either knee.  Assessment:    ICD-10-CM  1. Primary osteoarthritis of left knee  M17.12     Plan: Nansi is 73 year old female presents with left knee bone on bone arthritis. Based upon the patient's continued symptoms and failure to respond to conservative treatment, I have recommended a left total knee replacement for this patient. A long discussion took place with the patient describing what a total joint replacement is and what the procedure would entail. A knee model, similar to the implants that will be used during the operation, was utilized to demonstrate the implants. Choices of implant manufactures were discussed and reviewed. The ability to secure the implant utilizing cement or cementless (press fit) fixation was discussed. The approach and exposure was discussed.    The hospitalization and post-operative care and rehabilitation were also discussed. The use of  perioperative antibiotics and DVT prophylaxis  were discussed. The risk, benefits and alternatives to a surgical intervention were discussed at length with the patient. The patient was also advised of risks related to the medical comorbidities and elevated body mass index (BMI). A lengthy discussion took place to review the most common complications including but not limited to: stiffness, loss of function, complex regional pain syndrome, deep vein thrombosis, pulmonary embolus, heart attack, stroke, infection, wound breakdown, numbness, intraoperative fracture, damage to nerves, tendon,muscles, arteries or other blood vessels, death and other possible complications from anesthesia. The patient was told that we will take steps to minimize these risks by using sterile technique, antibiotics and DVT prophylaxis when appropriate and follow the patient postoperatively in the office setting to monitor progress. The possibility of recurrent pain, no improvement in pain and actual worsening of pain were also discussed with the patient.  We discussed her increased risk of potential complications due to elevated BMI however given the severe nature of her knee condition and under shared decision making model the patient is willing to move forward with the surgery despite some increased risks.   Patient asked about and confirms no history of any reactions to metal or metal allergy in the past.   The discharge plan of care focused on the patient going home following surgery. The patient was encouraged to make the necessary arrangements to have someone stay with them when they are discharged home.    The benefits of surgery were discussed with the patient including the potential for improving the patient's current clinical condition through operative intervention. Alternatives to surgical intervention including continued conservative management were also discussed in detail. All questions were answered to the satisfaction  of the patient. The patient participated and agreed to the plan of care as well as the use of the recommended implants for their total knee replacement surgery.   An information packet was given to the patient to review prior to surgery.    Patient received clearance for surgery.  All questions answered she agrees above plan move forward with left total knee replacement.     Portions of this record have been created using Scientist, clinical (histocompatibility and immunogenetics).  Dictation errors have been sought, but may not have been identified and corrected.   Arthea Sheer MD   Portions of this record have been created using Dragon dictation software.  Dictation errors have been sought, but may not have been identified and corrected.  Arthea Sheer MD

## 2024-05-16 ENCOUNTER — Encounter: Payer: Self-pay | Admitting: Orthopedic Surgery

## 2024-05-16 DIAGNOSIS — Z7982 Long term (current) use of aspirin: Secondary | ICD-10-CM

## 2024-05-16 NOTE — Progress Notes (Signed)
 Perioperative / Anesthesia Services  Pre-Admission Testing Clinical Review / Pre-Operative Anesthesia Consult  Date: 05/16/24  [name] DEMOGRAPHICS: Name: Summer [name] DOB: 12/03/1950 MRN:   [id number]  Note: Available PAT nursing documentation and vital signs have been reviewed. Clinical nursing staff has updated [name]'s PMH/PSHx, current medication list, and drug allergies/intolerances to ensure complete and comprehensive history available to assist care teams in MDM as it pertains to Summer aforementioned surgical procedure and anticipated anesthetic course. Extensive review of available clinical information personally performed. [place] PMH and PSHx updated with any diagnoses/procedures that  may have been inadvertently omitted during Summer [name] intake with Summer pre-admission testing department's nursing staff.  PLANNED SURGICAL PROCEDURE(S):   Case: [id number] Date/Time: 05/19/24 0715   Procedure: ARTHROPLASTY, KNEE, TOTAL (Left: Knee)   Anesthesia type: Choice   Diagnosis: Primary osteoarthritis of left knee [M17.12]   Pre-op diagnosis: Primary osteoarthritis of left knee M17.12   Location: [facility] OR ROOM 02 / [facility]S [facility]   Surgeons: Lorelle Hussar, MD        CLINICAL DISCUSSION: Summer [name] is a 73 y.o. female who is submitted for pre-surgical anesthesia review and clearance prior to Summer [name] undergoing Summer above procedure. [name] has never been a smoker in Summer past. Pertinent PMH includes: CAD, HFpEF, cerebral microvascular disease, posterior reversible encephalopathy syndrome, PAD, aortic atherosclerosis, HTN, HLD, T2DM, CKD-IV, asthma, COPD, OSAH (does not require nocturnal PAP therapy, GERD (on daily PPI), hiatal hernia, anemia, OA, Charcot foot due to diabetes, diabetic peripheral neuropathy, nephrolithiasis, chronic back pain, insomnia, depression, memory loss.  [name] is followed by cardiology (End, MD). Summer [name] was last seen in Summer cardiology clinic on 05/08/2024; notes  reviewed. At Summer time of Summer [name] clinic visit, [name] doing well overall from a cardiovascular perspective.  [name] with reports of mild exertional dyspnea that was reported to be stable and at baseline.  [name] was using a prescribed MDI. [name] denied any chest pain, PND, orthopnea, palpitations, significant peripheral edema, weakness, fatigue, vertiginous symptoms, or presyncope/syncope. [name] with a past medical history significant for cardiovascular diagnoses. Documented physical exam was grossly benign, providing no evidence of acute exacerbation and/or decompensation of Summer [name]'s known cardiovascular conditions.  [name] underwent myocardial pet imaging on 07/23/2019 revealing a normal left ventricular systolic function with an EF of greater than 65%.  There were mild coronary calcifications noted.  Mild mitral annular calcification was observed.  Myocardial perfusion was normal with no evidence of significant ischemia or scar.  TTE performed on 08/14/2022 revealed a normal left ventricular systolic function with an EF of 60-65%.  Moderate asymmetric LVH of Summer septal segment.  There were no regional wall motion abnormalities.  Left ventricular diastolic Doppler parameters were indeterminant.  Right ventricular size and function normal with a TAPSE measuring 3.6 cm  (normal range >/= 1.6 cm).  TR jet inadequate for assessing PA pressure.  There was moderate mitral annular calcification.  Aortic valve mildly sclerotic.  There was no significant valvular regurgitation. All transvalvular gradients were noted to be normal providing no evidence of hemodynamically significant valvular stenosis. Aorta normal in size with no evidence of ectasia or aneurysmal dilatation.  At Summer time of Summer aforementioned study, [name] was hospitalized for bacteremia.  Summer [name] went on to have a transesophageal echocardiogram to assess for valvular vegetation on 08/16/2022.  Study was grossly unchanged from noninvasive version  2 days prior.  No evidence of vegetation or infective endocarditis was observed.  Blood pressure well controlled at 117/67 mmHg on  currently prescribed CCB (amlodipine ), beta-blocker (metoprolol  tartrate), and diuretic (furosemide ) therapies.  [name] is on ezetimibe  + rosuvastatin  for Summer [name] HLD diagnosis and ASCVD prevention.  T2DM loosely controlled on currently prescribed regimen; last HgbA1c was 7.7% when checked on 03/27/2024. In Summer setting of known cardiovascular diagnoses and concurrent T2DM, [name] is taking an SGLT2i (dapagliflozin ) for added cardiovascular and renovascular protection.  [name] does have an OSAH diagnosis, however does not require Summer use of nocturnal PAP therapy.  Functional capacity limited by [name]'s orthopedic pain.  Summer [name] presented to clinic in a wheelchair.  With that said, [name] able to complete all of Summer [name] ADLs/IADLs without significant cardiovascular limitation.  Per Summer DASI, [name] felt to be able to achieve at least 4 METS of physical activity without experiencing any significant degree of angina/anginal equivalent symptoms.  No changes were made to Summer [name] medication regimen during Summer [name] visit with cardiology.  [name] scheduled to follow-up with outpatient cardiology in 12 months or sooner if needed.  Summer [name] is scheduled for an elective ARTHROPLASTY, KNEE, TOTAL (Left: Knee) on 05/19/2024 with Dr. Arthea Sheer, MD. Given [name]'s past medical history significant for cardiovascular diagnoses, presurgical cardiac clearance was sought by Summer PAT team.  Per cardiology, Summer [name]'s perioperative risk of a major cardiac event is 6.6% according to Summer Revised Cardiac Risk Index (RCRI).  Therefore, Summer [name] is at high risk for perioperative complications.   Summer [name] functional capacity is fair at 4.64 METs according to Summer Duke Activity Status Index (DASI). According to ACC/AHA guidelines, no further cardiovascular testing needed.  Summer [name] may proceed to surgery at ACCEPTABLE  risk.  In review of Summer [name]'s chart, it is noted that Summer [name] is on daily oral antithrombotic therapy. Given that [name]'s past medical history is significant for cardiovascular diagnoses, including but not limited to CAD, orthopedics has cleared [name] to continue Summer [name] daily low dose ASA throughout Summer [name] perioperative course.  [name] has been updated on these directives from Summer [name] specialty care providers by Summer PAT team.  [name] reports previous perioperative complications with anesthesia in Summer past.  [name] advising that Summer [name] has a history of (+) premature emergence from general anesthesia in Summer past.  In review Summer [name] EMR, it is noted that [name] underwent a general anesthetic course here at [facility] ([facility]) in 04/2023 without documented complications.   MOST RECENT VITAL SIGNS:    05/08/2024    2:58 PM 05/06/2024   10:19 AM 04/04/2024    1:47 PM  Vitals with BMI  Height 5' 2  5' 2  Weight 236 lbs 234 lbs 13 oz 231 lbs  BMI 43.15  42.24  Systolic 117 153 879  Diastolic 67 60 70  Pulse 73 76 91   PROVIDERS/SPECIALISTS: NOTE: Primary physician provider listed below. [name] may have been seen by APP or partner within same practice.   PROVIDER ROLE / SPECIALTY LAST Summer [name] Arthea, MD Orthopedics (Surgeon) 05/14/2024  Glenard Mire, MD Primary Care Provider 04/04/2024  End, Lonni, MD Cardiology 05/08/2024  Cherilyn Ned, MD Endocrinology 03/27/2024  Marcelino Gales, MD Nephrology 02/27/2024   ALLERGIES: Allergies  Allergen Reactions   Codeine Other (See Comments)    TRIPPED OUT  DIDN'T LIKE Summer WAY IT FELT   Atorvastatin     muscle pain   Hydrocodone      itching   Tramadol     Latex Rash   Zolpidem  Other (See Comments)    Sleep walk    CURRENT HOME MEDICATIONS: No  current facility-administered medications for this encounter.    acetaminophen  (TYLENOL  8 HOUR) 650 MG CR tablet   ALBUTEROL  IN   amLODipine  (NORVASC )  5 MG tablet   ARIPiprazole  (ABILIFY ) 2 MG tablet   ascorbic acid  (VITAMIN C ) 100 MG tablet   ASPIRIN  LOW DOSE 81 MG EC tablet   B-D ULTRAFINE III SHORT PEN 31G X 8 MM MISC   [place]-Glucosamine-Vit D (OSTEO BI-FLEX ONE PER DAY PO)   buPROPion  (WELLBUTRIN  XL) 150 MG 24 hr tablet   Cholecalciferol  (VITAMIN D3) 25 MCG (1000 UT) CAPS   Continuous Blood Gluc Sensor (FREESTYLE LIBRE 2 SENSOR) MISC   cyanocobalamin  (VITAMIN B12) 500 MCG tablet   ezetimibe  (ZETIA ) 10 MG tablet   FARXIGA  10 MG TABS tablet   furosemide  (LASIX ) 20 MG tablet   insulin  aspart (NOVOLOG  FLEXPEN) 100 UNIT/ML FlexPen   Lactobacillus-Inulin (CULTURELLE DIGESTIVE DAILY PO)   metoprolol  tartrate (LOPRESSOR ) 50 MG tablet   Misc Natural Products (OSTEO BI-FLEX ADV JOINT SHIELD PO)   omeprazole  (PRILOSEC) 40 MG capsule   pregabalin  (LYRICA ) 150 MG capsule   Probiotic Product (PROBIOTIC ADVANCED PO)   psyllium (METAMUCIL) 58.6 % powder   rosuvastatin  (CRESTOR ) 40 MG tablet   Semaglutide  (OZEMPIC , 1 MG/DOSE, Amery)   sertraline  (ZOLOFT ) 100 MG tablet   traZODone  (DESYREL ) 100 MG tablet   TRESIBA FLEXTOUCH 200 UNIT/ML FlexTouch Pen   VASCEPA  1 g capsule   HISTORY: Past Medical History:  Diagnosis Date   (HFpEF) heart failure with preserved ejection fraction ([name])    Anemia    Arthritis    Asthma    B12 deficiency    Back pain    CAD (coronary artery disease)    Carpal tunnel syndrome    Charcot foot due to diabetes mellitus ([name])    CKD (chronic kidney disease), stage IV ([name])    Complication of anesthesia    a.) early emergence   Constipation    COPD (chronic obstructive pulmonary disease) ([name])    Depression    Diabetic peripheral neuropathy ([name])    Dyspnea    GERD (gastroesophageal reflux disease)    Headache    History of hiatal hernia    History of kidney stones    Hyperlipidemia    Hypertension    IBS (irritable bowel syndrome)    Insomnia    a.) takes trazodone  PRN   Joint pain    Long-term use  of aspirin  therapy    Memory loss    Obesity    OSA (obstructive sleep apnea)    a.) does not utilize nocturnal PAP therapy   PAD (peripheral artery disease) ([name])    Pneumonia    Rhinitis, allergic    Rosacea    SOB (shortness of breath)    T2DM (type 2 diabetes mellitus) ([name])    Thyroid  disease    Unspecified hearing loss    Vitamin D  deficiency    Past Surgical History:  Procedure Laterality Date   ABDOMINAL HYSTERECTOMY  1975   AMPUTATION TOE Left 05/02/2023   Procedure: LEFT FOOT PARTIAL AMPUTATION 1st & 2nd TOE;  Surgeon: Silva Juliene SAUNDERS, DPM;  Location: [facility] ORS;  Service: Orthopedics/Podiatry;  Laterality: Left;   ANKLE SURGERY Left approx Jan 2018   APPENDECTOMY  1970   CATARACT EXTRACTION Bilateral 01/2011   right   COLONOSCOPY WITH PROPOFOL  N/A 08/19/2019   Procedure: COLONOSCOPY WITH PROPOFOL ;  Surgeon: Therisa Bi, MD;  Location: [facility] END[facility]COPY;  Service: Gastroenterology;  Laterality: N/A;   eye lid  surgery  2013   bilateral   FOOT SURGERY     LOWER EXTREMITY ANGIOGRAPHY Left 11/23/2020   Procedure: LOWER EXTREMITY ANGIOGRAPHY;  Surgeon: Jama Cordella MATSU, MD;  Location: [facility] INVASIVE CV LAB;  Service: Cardiovascular;  Laterality: Left;   NECK SURGERY     SPINE SURGERY     TEE WITHOUT CARDIOVERSION N/A 08/16/2022   Procedure: TRANSESOPHAGEAL ECHOCARDIOGRAM (TEE);  Surgeon: Darliss Rogue, MD;  Location: [facility] ORS;  Service: Cardiovascular;  Laterality: N/A;   TUBAL LIGATION     VAGINAL HYSTERECTOMY  1989   Family History  Problem Relation Age of Onset   Aneurysm Mother    Aortic aneurysm Mother    Hypertension Mother    Hyperlipidemia Mother    Heart disease Mother    Obesity Mother    Heart attack Maternal Grandfather    Diabetes Maternal Grandfather    Social History   Tobacco Use   Smoking status: Never   Smokeless tobacco: Never  Substance Use Topics   Alcohol use: No    Alcohol/week: 0.0 standard drinks of alcohol   LABS:  Hospital  Outpatient Visit on 05/06/2024  Component Date Value Ref Range Status   MRSA, PCR 05/06/2024 NEGATIVE  NEGATIVE Final   Staphylococcus aureus 05/06/2024 POSITIVE (A)  NEGATIVE Final   Comment: (NOTE) Summer Xpert SA Assay (FDA approved for NASAL specimens in patients 49 years of age and older), is one component of a comprehensive surveillance program. It is not intended to diagnose infection nor to guide or monitor treatment. Performed at [facility], [address] [place], [place] [address]    WBC 05/06/2024 9.9  4.0 - 10.5 K/uL Final   RBC 05/06/2024 4.41  3.87 - 5.11 MIL/uL Final   Hemoglobin 05/06/2024 13.2  12.0 - 15.0 g/dL Final   HCT 91/73/7974 39.6  36.0 - 46.0 % Final   MCV 05/06/2024 89.8  80.0 - 100.0 fL Final   MCH 05/06/2024 29.9  26.0 - 34.0 pg Final   MCHC 05/06/2024 33.3  30.0 - 36.0 g/dL Final   RDW 91/73/7974 14.6  11.5 - 15.5 % Final   Platelets 05/06/2024 204  150 - 400 K/uL Final   nRBC 05/06/2024 0.0  0.0 - 0.2 % Final   Neutrophils Relative % 05/06/2024 63  % Final   Neutro Abs 05/06/2024 6.2  1.7 - 7.7 K/uL Final   Lymphocytes Relative 05/06/2024 22  % Final   Lymphs Abs 05/06/2024 2.2  0.7 - 4.0 K/uL Final   Monocytes Relative 05/06/2024 10  % Final   Monocytes Absolute 05/06/2024 1.0  0.1 - 1.0 K/uL Final   Eosinophils Relative 05/06/2024 3  % Final   Eosinophils Absolute 05/06/2024 0.3  0.0 - 0.5 K/uL Final   Basophils Relative 05/06/2024 1  % Final   Basophils Absolute 05/06/2024 0.1  0.0 - 0.1 K/uL Final   Immature Granulocytes 05/06/2024 1  % Final   Abs Immature Granulocytes 05/06/2024 0.08 (H)  0.00 - 0.07 K/uL Final   Performed at [facility], [address] [place], [place] [address]   Sodium 05/06/2024 141  135 - 145 mmol/L Final   Potassium 05/06/2024 4.4  3.5 - 5.1 mmol/L Final   Chloride 05/06/2024 106  98 - 111 mmol/L Final   CO2 05/06/2024 26  22 - 32 mmol/L Final   Glucose, Bld 05/06/2024 84  70 - 99 mg/dL Final    Glucose reference range applies only to samples taken after fasting for  at least 8 hours.   BUN 05/06/2024 40 (H)  8 - 23 mg/dL Final   Creatinine, Ser 05/06/2024 1.45 (H)  0.44 - 1.00 mg/dL Final   Calcium  05/06/2024 10.0  8.9 - 10.3 mg/dL Final   Total Protein 91/73/7974 7.2  6.5 - 8.1 g/dL Final   Albumin 91/73/7974 4.5  3.5 - 5.0 g/dL Final   AST 91/73/7974 22  15 - 41 U/L Final   ALT 05/06/2024 21  0 - 44 U/L Final   Alkaline Phosphatase 05/06/2024 58  38 - 126 U/L Final   Total Bilirubin 05/06/2024 0.7  0.0 - 1.2 mg/dL Final   GFR, Estimated 05/06/2024 38 (L)  >60 mL/min Final   Comment: (NOTE) Calculated using Summer CKD-EPI Creatinine Equation (2021)    Anion gap 05/06/2024 9  5 - 15 Final   Performed at [facility], [address] [place], [place] [address]   Color, Urine 05/06/2024 STRAW (A)  YELLOW Final   APPearance 05/06/2024 CLEAR (A)  CLEAR Final   Specific Gravity, Urine 05/06/2024 1.014  1.005 - 1.030 Final   pH 05/06/2024 5.0  5.0 - 8.0 Final   Glucose, UA 05/06/2024 >=500 (A)  NEGATIVE mg/dL Final   Hgb urine dipstick 05/06/2024 NEGATIVE  NEGATIVE Final   Bilirubin Urine 05/06/2024 NEGATIVE  NEGATIVE Final   Ketones, ur 05/06/2024 NEGATIVE  NEGATIVE mg/dL Final   Protein, ur 91/73/7974 NEGATIVE  NEGATIVE mg/dL Final   Nitrite 91/73/7974 NEGATIVE  NEGATIVE Final   Leukocytes,Ua 05/06/2024 SMALL (A)  NEGATIVE Final   RBC / HPF 05/06/2024 0-5  0 - 5 RBC/hpf Final   WBC, UA 05/06/2024 6-10  0 - 5 WBC/hpf Final   Bacteria, UA 05/06/2024 NONE SEEN  NONE SEEN Final   Squamous Epithelial / HPF 05/06/2024 0-5  0 - 5 /HPF Final   Performed at [facility], [address] [place], [place] [address]    ECG: Date: 05/06/2024 Time ECG obtained: 1153 AM Rate: 70 bpm Rhythm: normal sinus Axis (leads I and aVF): normal Intervals: PR 208 ms. QRS 78 ms. QTc 408 ms. ST segment and T wave changes: No evidence of acute T wave abnormalities or significant ST  segment elevation or depression.  Evidence of a possible, age undetermined, prior infarct:  Yes; inferior Comparison: Previous tracing obtained on 03/16/2023 showed sinus bradycardia with first-degree AV block at rate of 55 bpm.  There was evidence of age undetermined inferior and anterior infarcts.   IMAGING / PROCEDURES: DIAGNOSTIC RADIOGRAPHS OF LEFT KNEE 4+ VIEWS performed on 05/14/2024 Severe degenerative changes with bone-on-bone articulation medially and tricompartmental osteophyte formation and sclerosis and anterior subluxation of Summer tibia relative to Summer femur.  Overall Lawrence grade 4.  AP, sunrise, and flexed PA of Summer right knee also show severe degenerative changes with tricompartmental osteophyte formation sclerosis and medial bone-on-bone articulation.  Kellgren-Lawrence grade 4.   No fractures or dislocations noted in either knee.   MR BRAIN WO CONTRAST performed on 01/23/2023 No evidence of an acute intracranial abnormality. Summer patchy symmetric cortical/subcortical T2 FLAIR hyperintense signal abnormality within Summer bilateral frontal, parietal and occipital lobes (attributed to posterior reversal encephalopathy syndrome) on Summer prior brain MRI of 08/20/2022 has resolved Mild chronic small vessel ischemic changes within Summer cerebral white matter, similar to Summer prior MRI.  CT ABDOMEN PELVIS W CONTRAST performed on 09/21/2022 Small hiatal hernia No acute abnormality seen in Summer abdomen or pelvis Aortic atherosclerosis  CT ANGIO HEAD NECK W WO CM  W PERF performed on 08/20/2022 No emergent finding by CTA or CT perfusion. Atherosclerosis with up to moderate narrowing at Summer right P2 segment and right V4 segment.  TRANSESOPHAGEAL ECHOCARDIOGRAM performed on 08/16/2022 Left ventricular ejection fraction, by estimation, is 60 to 65%. Summer left ventricle has normal function.  Right ventricular systolic function is normal. Summer right ventricular size is normal.  No left  atrial/left atrial appendage thrombus was detected.  Summer mitral valve is normal in structure. Trivial mitral valve regurgitation.  Summer aortic valve is normal in structure. Aortic valve regurgitation is not visualized.  Study quality degraded by [name] discomfort-resp distress and pain (knee and foot pain from recent surgery).   VAS US  ABI WITH/WO TBI performed on 12/05/2021 Resting right ankle-brachial index is within normal range. No evidence of significant right lower extremity arterial disease. Summer right toe-brachial index is normal. Resting left ankle-brachial index is within normal range. No evidence of significant left lower extremity arterial disease. Summer left toe-brachial index is normal.    IMPRESSION AND PLAN: Summer [name] has been referred for pre-anesthesia review and clearance prior to Summer [name] undergoing Summer planned anesthetic and procedural courses. Available labs, pertinent testing, and imaging results were personally reviewed by me in preparation for upcoming operative/procedural course. [facility] medical record has been updated following extensive record review and [name] interview with PAT staff.   This [name] has been appropriately cleared by cardiology with an overall ACCEPTABLE risk of [name] experiencing significant perioperative cardiovascular complications. Based on clinical review performed today (05/16/24), barring any significant acute changes in Summer [name]'s overall condition, it is anticipated that Summer [name] will be able to proceed with Summer planned surgical intervention. Any acute changes in clinical condition may necessitate Summer [name] procedure being postponed and/or cancelled. [name] will meet with anesthesia team (MD and/or CRNA) on Summer day of Summer [name] procedure for preoperative evaluation/assessment. Questions regarding anesthetic course will be fielded at that time.   Pre-surgical instructions were reviewed with Summer [name] during Summer [name], and questions were fielded to  satisfaction by PAT clinical staff. Summer [name] has been instructed on which medications that Summer [name] will need to hold prior to surgery, as well as Summer ones that have been deemed safe/appropriate to take on Summer day of Summer [name] procedure. As part of Summer general education provided by PAT, [name] made aware both verbally and in writing, that Summer [name] would need to abstain from Summer use of any illegal substances during Summer [name] perioperative course. Summer [name] was advised that failure to follow Summer provided instructions could necessitate case cancellation or result in serious perioperative complications up to and including death. [name] encouraged to contact PAT and/or Summer [name] surgeon's office to discuss any questions or concerns that may arise prior to surgery; verbalized understanding.   Dorise Pereyra, MSN, APRN, FNP-C, CEN [facility]  Perioperative Services Nurse Practitioner Phone: [phone] Fax: [phone] 05/16/24 1:08 PM  NOTE: This note has been prepared using Dragon dictation software. Despite my best ability to proofread, there is always Summer potential that unintentional transcriptional errors may still occur from this process.

## 2024-05-19 ENCOUNTER — Encounter: Payer: Self-pay | Admitting: Orthopedic Surgery

## 2024-05-19 ENCOUNTER — Other Ambulatory Visit: Payer: Self-pay

## 2024-05-19 ENCOUNTER — Ambulatory Visit: Payer: Self-pay | Admitting: Urgent Care

## 2024-05-19 ENCOUNTER — Ambulatory Visit: Payer: Self-pay

## 2024-05-19 ENCOUNTER — Encounter
Admission: RE | Disposition: A | Discharge status: Home Health | Payer: Self-pay | Source: Home / Self Care | Attending: Orthopedic Surgery

## 2024-05-19 ENCOUNTER — Ambulatory Visit

## 2024-05-19 ENCOUNTER — Ambulatory Visit
Admission: RE | Admit: 2024-05-19 | Discharge: 2024-05-20 | Disposition: A | Discharge status: Home Health | Attending: Orthopedic Surgery | Admitting: Orthopedic Surgery

## 2024-05-19 DIAGNOSIS — M1712 Unilateral primary osteoarthritis, left knee: Secondary | ICD-10-CM | POA: Diagnosis not present

## 2024-05-19 DIAGNOSIS — Z7984 Long term (current) use of oral hypoglycemic drugs: Secondary | ICD-10-CM | POA: Insufficient documentation

## 2024-05-19 DIAGNOSIS — I5032 Chronic diastolic (congestive) heart failure: Secondary | ICD-10-CM | POA: Diagnosis not present

## 2024-05-19 DIAGNOSIS — E119 Type 2 diabetes mellitus without complications: Secondary | ICD-10-CM | POA: Insufficient documentation

## 2024-05-19 DIAGNOSIS — Z01812 Encounter for preprocedural laboratory examination: Secondary | ICD-10-CM

## 2024-05-19 DIAGNOSIS — Z96652 Presence of left artificial knee joint: Secondary | ICD-10-CM

## 2024-05-19 DIAGNOSIS — G4733 Obstructive sleep apnea (adult) (pediatric): Secondary | ICD-10-CM | POA: Insufficient documentation

## 2024-05-19 DIAGNOSIS — I130 Hypertensive heart and chronic kidney disease with heart failure and stage 1 through stage 4 chronic kidney disease, or unspecified chronic kidney disease: Secondary | ICD-10-CM | POA: Insufficient documentation

## 2024-05-19 DIAGNOSIS — Z794 Long term (current) use of insulin: Secondary | ICD-10-CM | POA: Diagnosis not present

## 2024-05-19 DIAGNOSIS — E782 Mixed hyperlipidemia: Secondary | ICD-10-CM | POA: Diagnosis not present

## 2024-05-19 DIAGNOSIS — Z7982 Long term (current) use of aspirin: Secondary | ICD-10-CM

## 2024-05-19 HISTORY — DX: Long term (current) use of aspirin: Z79.82

## 2024-05-19 HISTORY — DX: Atherosclerosis of aorta: I70.0

## 2024-05-19 HISTORY — DX: Type 2 diabetes mellitus with diabetic polyneuropathy: E11.42

## 2024-05-19 HISTORY — DX: Chronic kidney disease, stage 4 (severe): N18.4

## 2024-05-19 HISTORY — DX: Posterior reversible encephalopathy syndrome: I67.83

## 2024-05-19 HISTORY — DX: Unspecified diastolic (congestive) heart failure: I50.30

## 2024-05-19 HISTORY — DX: Diaphragmatic hernia without obstruction or gangrene: K44.9

## 2024-05-19 HISTORY — DX: Atherosclerotic heart disease of native coronary artery without angina pectoris: I25.10

## 2024-05-19 HISTORY — DX: Peripheral vascular disease, unspecified: I73.9

## 2024-05-19 HISTORY — DX: Other cerebrovascular disease: I67.89

## 2024-05-19 HISTORY — DX: Type 2 diabetes mellitus with diabetic neuropathic arthropathy: E11.610

## 2024-05-19 HISTORY — DX: Hypothyroidism, unspecified: E03.9

## 2024-05-19 HISTORY — DX: Obstructive sleep apnea (adult) (pediatric): G47.33

## 2024-05-19 HISTORY — PX: TOTAL KNEE ARTHROPLASTY: SHX125

## 2024-05-19 HISTORY — DX: Type 2 diabetes mellitus without complications: E11.9

## 2024-05-19 LAB — GLUCOSE, CAPILLARY
Glucose-Capillary: 160 mg/dL — ABNORMAL HIGH (ref 70–99)
Glucose-Capillary: 175 mg/dL — ABNORMAL HIGH (ref 70–99)
Glucose-Capillary: 215 mg/dL — ABNORMAL HIGH (ref 70–99)
Glucose-Capillary: 239 mg/dL — ABNORMAL HIGH (ref 70–99)

## 2024-05-19 SURGERY — ARTHROPLASTY, KNEE, TOTAL
Anesthesia: General | Site: Knee | Laterality: Left

## 2024-05-19 MED ORDER — ASPIRIN 81 MG PO TBEC
81.0000 mg | DELAYED_RELEASE_TABLET | Freq: Every day | ORAL | Status: DC
Start: 2024-05-20 — End: 2024-05-20
  Administered 2024-05-20: 81 mg via ORAL
  Filled 2024-05-19: qty 1

## 2024-05-19 MED ORDER — DEXAMETHASONE SODIUM PHOSPHATE 10 MG/ML IJ SOLN
INTRAMUSCULAR | Status: DC | PRN
  Administered 2024-05-19: 4 mg via INTRAVENOUS

## 2024-05-19 MED ORDER — HYDROMORPHONE HCL 1 MG/ML IJ SOLN
INTRAMUSCULAR | Status: AC
  Filled 2024-05-19: qty 1

## 2024-05-19 MED ORDER — FENTANYL CITRATE (PF) 100 MCG/2ML IJ SOLN
INTRAMUSCULAR | Status: AC
  Filled 2024-05-19: qty 2

## 2024-05-19 MED ORDER — AMLODIPINE BESYLATE 10 MG PO TABS
5.0000 mg | ORAL_TABLET | Freq: Every day | ORAL | Status: DC
  Administered 2024-05-19 – 2024-05-20 (×2): 5 mg via ORAL
  Filled 2024-05-19 (×2): qty 1

## 2024-05-19 MED ORDER — ONDANSETRON HCL 4 MG/2ML IJ SOLN
INTRAMUSCULAR | Status: AC
  Filled 2024-05-19: qty 2

## 2024-05-19 MED ORDER — ONDANSETRON HCL 4 MG/2ML IJ SOLN: 4.0000 mg | Freq: Four times a day (QID) | INTRAMUSCULAR | Status: DC | PRN

## 2024-05-19 MED ORDER — ENOXAPARIN SODIUM 40 MG/0.4ML IJ SOSY: 40.0000 mg | PREFILLED_SYRINGE | INTRAMUSCULAR | 0 refills | Status: DC

## 2024-05-19 MED ORDER — LIDOCAINE HCL (CARDIAC) PF 100 MG/5ML IV SOSY
PREFILLED_SYRINGE | INTRAVENOUS | Status: DC | PRN
  Administered 2024-05-19: 60 mg via INTRAVENOUS

## 2024-05-19 MED ORDER — INSULIN ASPART 100 UNIT/ML IJ SOLN
0.0000 [IU] | Freq: Three times a day (TID) | INTRAMUSCULAR | Status: DC
  Administered 2024-05-19: 5 [IU] via SUBCUTANEOUS
  Administered 2024-05-20 (×2): 3 [IU] via SUBCUTANEOUS
  Filled 2024-05-19 (×3): qty 1

## 2024-05-19 MED ORDER — CHLORHEXIDINE GLUCONATE 0.12 % MT SOLN
OROMUCOSAL | Status: AC
  Filled 2024-05-19: qty 15

## 2024-05-19 MED ORDER — BUPIVACAINE LIPOSOME 1.3 % IJ SUSP
INTRAMUSCULAR | Status: AC
  Filled 2024-05-19: qty 20

## 2024-05-19 MED ORDER — MIDAZOLAM HCL 2 MG/2ML IJ SOLN
INTRAMUSCULAR | Status: AC
  Filled 2024-05-19: qty 2

## 2024-05-19 MED ORDER — DOCUSATE SODIUM 100 MG PO CAPS: 100.0000 mg | ORAL_CAPSULE | Freq: Two times a day (BID) | ORAL | 0 refills | Status: DC

## 2024-05-19 MED ORDER — PHENYLEPHRINE 80 MCG/ML (10ML) SYRINGE FOR IV PUSH (FOR BLOOD PRESSURE SUPPORT)
PREFILLED_SYRINGE | INTRAVENOUS | Status: DC | PRN
  Administered 2024-05-19: 80 ug via INTRAVENOUS
  Administered 2024-05-19: 40 ug via INTRAVENOUS

## 2024-05-19 MED ORDER — TRANEXAMIC ACID-NACL 1000-0.7 MG/100ML-% IV SOLN
1000.0000 mg | INTRAVENOUS | Status: AC
  Administered 2024-05-19 (×2): 1000 mg via INTRAVENOUS

## 2024-05-19 MED ORDER — SURGIPHOR WOUND IRRIGATION SYSTEM - OPTIME
TOPICAL | Status: DC | PRN
Start: 2024-05-19 — End: 2024-05-19
  Administered 2024-05-19: 450 mL via TOPICAL

## 2024-05-19 MED ORDER — CEFAZOLIN SODIUM-DEXTROSE 2-4 GM/100ML-% IV SOLN
INTRAVENOUS | Status: AC
  Filled 2024-05-19: qty 100

## 2024-05-19 MED ORDER — SUGAMMADEX SODIUM 200 MG/2ML IV SOLN
INTRAVENOUS | Status: DC | PRN
  Administered 2024-05-19: 200 mg via INTRAVENOUS

## 2024-05-19 MED ORDER — METOPROLOL TARTRATE 25 MG PO TABS
25.0000 mg | ORAL_TABLET | Freq: Two times a day (BID) | ORAL | Status: DC
  Administered 2024-05-19 – 2024-05-20 (×3): 25 mg via ORAL
  Filled 2024-05-19 (×3): qty 1

## 2024-05-19 MED ORDER — METOCLOPRAMIDE HCL 5 MG/ML IJ SOLN: 5.0000 mg | Freq: Three times a day (TID) | INTRAMUSCULAR | Status: DC | PRN

## 2024-05-19 MED ORDER — BUPIVACAINE-EPINEPHRINE (PF) 0.25% -1:200000 IJ SOLN
INTRAMUSCULAR | Status: AC
Start: 2024-05-19 — End: 2024-05-19
  Filled 2024-05-19: qty 30

## 2024-05-19 MED ORDER — CHLORHEXIDINE GLUCONATE 4 % EX SOLN: 1.0000 | CUTANEOUS | 1 refills | Status: DC

## 2024-05-19 MED ORDER — MORPHINE SULFATE (PF) 4 MG/ML IV SOLN
0.5000 mg | INTRAVENOUS | Status: DC | PRN
  Administered 2024-05-19: 1 mg via INTRAVENOUS
  Filled 2024-05-19: qty 1

## 2024-05-19 MED ORDER — SERTRALINE HCL 50 MG PO TABS
150.0000 mg | ORAL_TABLET | Freq: Every day | ORAL | Status: DC
  Administered 2024-05-19 – 2024-05-20 (×2): 150 mg via ORAL
  Filled 2024-05-19 (×2): qty 3

## 2024-05-19 MED ORDER — TRANEXAMIC ACID-NACL 1000-0.7 MG/100ML-% IV SOLN
INTRAVENOUS | Status: AC
  Filled 2024-05-19: qty 100

## 2024-05-19 MED ORDER — ACETAMINOPHEN 10 MG/ML IV SOLN
INTRAVENOUS | Status: DC | PRN
  Administered 2024-05-19: 1000 mg via INTRAVENOUS

## 2024-05-19 MED ORDER — INSULIN GLARGINE 100 UNIT/ML ~~LOC~~ SOLN
26.0000 [IU] | Freq: Every day | SUBCUTANEOUS | Status: DC
  Administered 2024-05-19: 26 [IU] via SUBCUTANEOUS
  Filled 2024-05-19 (×2): qty 0.26

## 2024-05-19 MED ORDER — METOCLOPRAMIDE HCL 5 MG PO TABS: 5.0000 mg | ORAL_TABLET | Freq: Three times a day (TID) | ORAL | Status: DC | PRN

## 2024-05-19 MED ORDER — ORAL CARE MOUTH RINSE: 15.0000 mL | Freq: Once | OROMUCOSAL | Status: AC

## 2024-05-19 MED ORDER — SODIUM CHLORIDE 0.9 % IV SOLN: INTRAVENOUS | Status: DC

## 2024-05-19 MED ORDER — ROSUVASTATIN CALCIUM 10 MG PO TABS
40.0000 mg | ORAL_TABLET | Freq: Every day | ORAL | Status: DC
  Administered 2024-05-19 – 2024-05-20 (×2): 40 mg via ORAL
  Filled 2024-05-19 (×2): qty 4

## 2024-05-19 MED ORDER — ACETAMINOPHEN 10 MG/ML IV SOLN
INTRAVENOUS | Status: AC
  Filled 2024-05-19: qty 100

## 2024-05-19 MED ORDER — EZETIMIBE 10 MG PO TABS
10.0000 mg | ORAL_TABLET | Freq: Every day | ORAL | Status: DC
  Administered 2024-05-19 – 2024-05-20 (×2): 10 mg via ORAL

## 2024-05-19 MED ORDER — CEFAZOLIN SODIUM-DEXTROSE 2-4 GM/100ML-% IV SOLN
2.0000 g | Freq: Four times a day (QID) | INTRAVENOUS | Status: AC
  Administered 2024-05-19 (×2): 2 g via INTRAVENOUS
  Filled 2024-05-19 (×4): qty 100

## 2024-05-19 MED ORDER — FENTANYL CITRATE (PF) 100 MCG/2ML IJ SOLN
25.0000 ug | INTRAMUSCULAR | Status: DC | PRN
  Administered 2024-05-19 (×2): 25 ug via INTRAVENOUS
  Administered 2024-05-19: 50 ug via INTRAVENOUS

## 2024-05-19 MED ORDER — ONDANSETRON HCL 4 MG PO TABS: 4.0000 mg | ORAL_TABLET | Freq: Four times a day (QID) | ORAL | Status: DC | PRN

## 2024-05-19 MED ORDER — OXYCODONE HCL 5 MG PO TABS: 5.0000 mg | ORAL_TABLET | ORAL | Status: DC | PRN

## 2024-05-19 MED ORDER — ONDANSETRON HCL 4 MG/2ML IJ SOLN
INTRAMUSCULAR | Status: DC | PRN
  Administered 2024-05-19: 4 mg via INTRAVENOUS

## 2024-05-19 MED ORDER — OXYCODONE HCL 5 MG PO TABS
10.0000 mg | ORAL_TABLET | ORAL | Status: DC | PRN
Start: 2024-05-19 — End: 2024-05-20
  Administered 2024-05-19 – 2024-05-20 (×5): 10 mg via ORAL
  Filled 2024-05-19 (×5): qty 2

## 2024-05-19 MED ORDER — DEXAMETHASONE SODIUM PHOSPHATE 10 MG/ML IJ SOLN
8.0000 mg | Freq: Once | INTRAMUSCULAR | Status: AC
  Administered 2024-05-19: 8 mg via INTRAVENOUS

## 2024-05-19 MED ORDER — MUPIROCIN 2 % EX OINT: 1.0000 | TOPICAL_OINTMENT | Freq: Two times a day (BID) | CUTANEOUS | 0 refills | Status: AC

## 2024-05-19 MED ORDER — PREGABALIN 75 MG PO CAPS
150.0000 mg | ORAL_CAPSULE | Freq: Two times a day (BID) | ORAL | Status: DC
  Administered 2024-05-19 – 2024-05-20 (×3): 150 mg via ORAL
  Filled 2024-05-19 (×3): qty 2

## 2024-05-19 MED ORDER — DEXAMETHASONE SODIUM PHOSPHATE 10 MG/ML IJ SOLN
INTRAMUSCULAR | Status: AC
  Filled 2024-05-19: qty 1

## 2024-05-19 MED ORDER — ENOXAPARIN SODIUM 30 MG/0.3ML IJ SOSY
30.0000 mg | PREFILLED_SYRINGE | Freq: Two times a day (BID) | INTRAMUSCULAR | Status: DC
Start: 2024-05-20 — End: 2024-05-20
  Administered 2024-05-20: 30 mg via SUBCUTANEOUS
  Filled 2024-05-19: qty 0.3

## 2024-05-19 MED ORDER — ACETAMINOPHEN 325 MG PO TABS: 325.0000 mg | ORAL_TABLET | Freq: Four times a day (QID) | ORAL | Status: DC | PRN

## 2024-05-19 MED ORDER — DOCUSATE SODIUM 100 MG PO CAPS
100.0000 mg | ORAL_CAPSULE | Freq: Two times a day (BID) | ORAL | Status: DC
  Administered 2024-05-19 – 2024-05-20 (×2): 100 mg via ORAL
  Filled 2024-05-19 (×2): qty 1

## 2024-05-19 MED ORDER — EZETIMIBE 10 MG PO TABS
ORAL_TABLET | ORAL | Status: AC
  Filled 2024-05-19: qty 1

## 2024-05-19 MED ORDER — TRAZODONE HCL 50 MG PO TABS
150.0000 mg | ORAL_TABLET | Freq: Every day | ORAL | Status: DC
  Administered 2024-05-19: 150 mg via ORAL
  Filled 2024-05-19: qty 1

## 2024-05-19 MED ORDER — CEFAZOLIN SODIUM-DEXTROSE 2-4 GM/100ML-% IV SOLN
2.0000 g | INTRAVENOUS | Status: AC
  Administered 2024-05-19: 2 g via INTRAVENOUS

## 2024-05-19 MED ORDER — ONDANSETRON HCL 4 MG PO TABS: 4.0000 mg | ORAL_TABLET | Freq: Four times a day (QID) | ORAL | 0 refills | Status: DC | PRN

## 2024-05-19 MED ORDER — INSULIN ASPART 100 UNIT/ML IJ SOLN
0.0000 [IU] | Freq: Every day | INTRAMUSCULAR | Status: DC
  Administered 2024-05-19: 2 [IU] via SUBCUTANEOUS
  Filled 2024-05-19: qty 1

## 2024-05-19 MED ORDER — PHENOL 1.4 % MT LIQD: 1.0000 | OROMUCOSAL | Status: DC | PRN

## 2024-05-19 MED ORDER — FUROSEMIDE 20 MG PO TABS
20.0000 mg | ORAL_TABLET | Freq: Every day | ORAL | Status: DC
  Administered 2024-05-19 – 2024-05-20 (×2): 20 mg via ORAL
  Filled 2024-05-19 (×2): qty 1

## 2024-05-19 MED ORDER — ACETAMINOPHEN 500 MG PO TABS: 1000.0000 mg | ORAL_TABLET | Freq: Three times a day (TID) | ORAL | 0 refills | Status: AC

## 2024-05-19 MED ORDER — HYDROMORPHONE HCL 1 MG/ML IJ SOLN
INTRAMUSCULAR | Status: DC | PRN
  Administered 2024-05-19 (×2): .5 mg via INTRAVENOUS

## 2024-05-19 MED ORDER — PROPOFOL 1000 MG/100ML IV EMUL
INTRAVENOUS | Status: AC
  Filled 2024-05-19: qty 100

## 2024-05-19 MED ORDER — ARIPIPRAZOLE 2 MG PO TABS
2.0000 mg | ORAL_TABLET | Freq: Every evening | ORAL | Status: DC
  Administered 2024-05-19: 2 mg via ORAL
  Filled 2024-05-19: qty 1

## 2024-05-19 MED ORDER — BUPIVACAINE LIPOSOME 1.3 % IJ SUSP
INTRAMUSCULAR | Status: DC | PRN
  Administered 2024-05-19: 20 mL

## 2024-05-19 MED ORDER — LACTATED RINGERS IV SOLN: INTRAVENOUS | Status: DC | PRN

## 2024-05-19 MED ORDER — OXYCODONE HCL 5 MG/5ML PO SOLN: 5.0000 mg | Freq: Once | ORAL | Status: DC | PRN

## 2024-05-19 MED ORDER — ARTIFICIAL TEARS OPHTHALMIC OINT
TOPICAL_OINTMENT | OPHTHALMIC | Status: AC
Start: 2024-05-19 — End: 2024-05-19
  Filled 2024-05-19: qty 3.5

## 2024-05-19 MED ORDER — LIDOCAINE HCL (PF) 2 % IJ SOLN
INTRAMUSCULAR | Status: AC
  Filled 2024-05-19: qty 5

## 2024-05-19 MED ORDER — MENTHOL 3 MG MT LOZG
1.0000 | LOZENGE | OROMUCOSAL | Status: DC | PRN
  Filled 2024-05-19: qty 9

## 2024-05-19 MED ORDER — BUPROPION HCL ER (XL) 150 MG PO TB24
150.0000 mg | ORAL_TABLET | Freq: Every day | ORAL | Status: DC
  Administered 2024-05-19 – 2024-05-20 (×2): 150 mg via ORAL
  Filled 2024-05-19 (×2): qty 1

## 2024-05-19 MED ORDER — PANTOPRAZOLE SODIUM 40 MG PO TBEC
40.0000 mg | DELAYED_RELEASE_TABLET | Freq: Every day | ORAL | Status: DC
  Administered 2024-05-19 – 2024-05-20 (×2): 40 mg via ORAL
  Filled 2024-05-19 (×2): qty 1

## 2024-05-19 MED ORDER — CHLORHEXIDINE GLUCONATE 0.12 % MT SOLN
15.0000 mL | Freq: Once | OROMUCOSAL | Status: AC
  Administered 2024-05-19: 15 mL via OROMUCOSAL

## 2024-05-19 MED ORDER — ROCURONIUM BROMIDE 100 MG/10ML IV SOLN
INTRAVENOUS | Status: DC | PRN
  Administered 2024-05-19: 50 mg via INTRAVENOUS

## 2024-05-19 MED ORDER — HYDROMORPHONE HCL 1 MG/ML IJ SOLN
0.5000 mg | INTRAMUSCULAR | Status: DC | PRN
  Administered 2024-05-19: 0.5 mg via INTRAVENOUS

## 2024-05-19 MED ORDER — BUPIVACAINE-EPINEPHRINE (PF) 0.25% -1:200000 IJ SOLN
INTRAMUSCULAR | Status: DC | PRN
  Administered 2024-05-19: 30 mL

## 2024-05-19 MED ORDER — PROPOFOL 10 MG/ML IV BOLUS
INTRAVENOUS | Status: DC | PRN
  Administered 2024-05-19: 160 mg via INTRAVENOUS
  Administered 2024-05-19: 40 mg via INTRAVENOUS

## 2024-05-19 MED ORDER — SODIUM CHLORIDE (PF) 0.9 % IJ SOLN
INTRAMUSCULAR | Status: AC
Start: 2024-05-19 — End: 2024-05-19
  Filled 2024-05-19: qty 20

## 2024-05-19 MED ORDER — SODIUM CHLORIDE 0.9 % IR SOLN
Status: DC | PRN
  Administered 2024-05-19: 1000 mL

## 2024-05-19 MED ORDER — SODIUM CHLORIDE (PF) 0.9 % IJ SOLN
INTRAMUSCULAR | Status: DC | PRN
Start: 2024-05-19 — End: 2024-05-19
  Administered 2024-05-19: 20 mL

## 2024-05-19 MED ORDER — OXYCODONE HCL 5 MG PO TABS: 2.5000 mg | ORAL_TABLET | ORAL | 0 refills | Status: DC | PRN

## 2024-05-19 MED ORDER — ACETAMINOPHEN 500 MG PO TABS
1000.0000 mg | ORAL_TABLET | Freq: Three times a day (TID) | ORAL | Status: DC
  Administered 2024-05-19 – 2024-05-20 (×3): 1000 mg via ORAL
  Filled 2024-05-19 (×3): qty 2

## 2024-05-19 MED ORDER — OXYCODONE HCL 5 MG PO TABS: 5.0000 mg | ORAL_TABLET | Freq: Once | ORAL | Status: DC | PRN

## 2024-05-19 MED ORDER — PHENYLEPHRINE 80 MCG/ML (10ML) SYRINGE FOR IV PUSH (FOR BLOOD PRESSURE SUPPORT)
PREFILLED_SYRINGE | INTRAVENOUS | Status: AC
Start: 2024-05-19 — End: 2024-05-19
  Filled 2024-05-19: qty 10

## 2024-05-19 MED ORDER — FENTANYL CITRATE (PF) 100 MCG/2ML IJ SOLN
INTRAMUSCULAR | Status: DC | PRN
  Administered 2024-05-19 (×3): 50 ug via INTRAVENOUS

## 2024-05-19 SURGICAL SUPPLY — 58 items
AUGMENT MED ASF PS 4-5 C-D 10 (Joint) IMPLANT
BLADE REAMER PATELLA SZ29 (BLADE) IMPLANT
BLADE SAW 90X13X1.19 OSCILLAT (BLADE) IMPLANT
BLADE SAW SAG 25X90X1.19 (BLADE) ×1 IMPLANT
BLADE SAW SAG 29X58X.64 (BLADE) ×1 IMPLANT
BNDG ELASTIC 6INX 5YD STR LF (GAUZE/BANDAGES/DRESSINGS) ×1 IMPLANT
BOWL CEMENT MIX W/ADAPTER (MISCELLANEOUS) IMPLANT
BRUSH SCRUB EZ PLAIN DRY (MISCELLANEOUS) IMPLANT
CEMENT BONE R 1X40 (Cement) IMPLANT
CHLORAPREP W/TINT 26 (MISCELLANEOUS) ×2 IMPLANT
COMPONENT FEM CMT PS STD 5 LT (Joint) IMPLANT
COOLER ICEMAN CLASSIC (MISCELLANEOUS) ×1 IMPLANT
CUFF TRNQT CYL 24X4X16.5-23 (TOURNIQUET CUFF) IMPLANT
CUFF TRNQT CYL 30X4X21-28X (TOURNIQUET CUFF) IMPLANT
DERMABOND ADVANCED .7 DNX12 (GAUZE/BANDAGES/DRESSINGS) ×1 IMPLANT
DRAPE SHEET LG 3/4 BI-LAMINATE (DRAPES) ×2 IMPLANT
DRSG MEPILEX SACRM 8.7X9.8 (GAUZE/BANDAGES/DRESSINGS) ×1 IMPLANT
DRSG OPSITE POSTOP 4X10 (GAUZE/BANDAGES/DRESSINGS) IMPLANT
DRSG OPSITE POSTOP 4X8 (GAUZE/BANDAGES/DRESSINGS) IMPLANT
ELECTRODE REM PT RTRN 9FT ADLT (ELECTROSURGICAL) ×1 IMPLANT
GLOVE BIOGEL PI IND STRL 8 (GLOVE) ×1 IMPLANT
GLOVE PI ORTHO PRO STRL 7.5 (GLOVE) ×2 IMPLANT
GLOVE PI ORTHO PRO STRL SZ8 (GLOVE) ×2 IMPLANT
GLOVE SURG SYN 7.5 PF PI (GLOVE) ×1 IMPLANT
GOWN SRG XL LVL 3 NONREINFORCE (GOWNS) ×1 IMPLANT
GOWN STRL REUS W/ TWL LRG LVL3 (GOWN DISPOSABLE) ×1 IMPLANT
GOWN STRL REUS W/ TWL XL LVL3 (GOWN DISPOSABLE) ×1 IMPLANT
HOOD PEEL AWAY T7 (MISCELLANEOUS) ×2 IMPLANT
KIT TURNOVER KIT A (KITS) ×1 IMPLANT
MANIFOLD NEPTUNE II (INSTRUMENTS) ×1 IMPLANT
MARKER SKIN DUAL TIP RULER LAB (MISCELLANEOUS) ×1 IMPLANT
MAT ABSORB FLUID 56X50 GRAY (MISCELLANEOUS) ×1 IMPLANT
NDL HYPO 21X1.5 SAFETY (NEEDLE) ×1 IMPLANT
NEEDLE HYPO 21X1.5 SAFETY (NEEDLE) ×1 IMPLANT
PACK TOTAL KNEE (MISCELLANEOUS) ×1 IMPLANT
PAD ARMBOARD POSITIONER FOAM (MISCELLANEOUS) ×3 IMPLANT
PAD COLD UNI WRAP-ON (PAD) ×1 IMPLANT
PENCIL SMOKE EVACUATOR (MISCELLANEOUS) ×1 IMPLANT
PIN DRILL HDLS TROCAR 75 4PK (PIN) IMPLANT
SCREW FEMALE HEX FIX 25X2.5 (ORTHOPEDIC DISPOSABLE SUPPLIES) IMPLANT
SCREW HEX HEADED 3.5X27 DISP (ORTHOPEDIC DISPOSABLE SUPPLIES) IMPLANT
SLEEVE SCD COMPRESS KNEE MED (STOCKING) ×1 IMPLANT
SOL .9 NS 3000ML IRR UROMATIC (IV SOLUTION) ×1 IMPLANT
SOLUTION IRRIG SURGIPHOR (IV SOLUTION) ×1 IMPLANT
STEM POLY PAT PLY 32M KNEE (Knees) IMPLANT
STEM TIB ST PERS 14+30 (Stem) IMPLANT
STEM TIBIA 5 DEG SZ D L KNEE (Knees) IMPLANT
STOCKINETTE IMPERV 14X48 (MISCELLANEOUS) ×1 IMPLANT
SUT STRATAFIX 14 PDO 36 VLT (SUTURE) ×1 IMPLANT
SUT VIC AB 0 CT1 36 (SUTURE) ×1 IMPLANT
SUT VIC AB 2-0 CT2 27 (SUTURE) ×2 IMPLANT
SUTURE STRATA SPIR 4-0 18 (SUTURE) ×1 IMPLANT
SUTURE VICRYL 1-0 27IN ABS (SUTURE) ×1 IMPLANT
SYR 20ML LL LF (SYRINGE) ×2 IMPLANT
TAPE CLOTH 3X10 WHT NS LF (GAUZE/BANDAGES/DRESSINGS) ×1 IMPLANT
TIP FAN IRRIG PULSAVAC PLUS (DISPOSABLE) ×1 IMPLANT
TRAP FLUID SMOKE EVACUATOR (MISCELLANEOUS) ×1 IMPLANT
WATER STERILE IRR 1000ML POUR (IV SOLUTION) ×1 IMPLANT

## 2024-05-19 NOTE — Interval H&P Note (Signed)
 Patient history and physical updated. Consent reviewed including risks, benefits, and alternatives to surgery. Patient agrees with above plan to proceed with left total knee arthroplasty.

## 2024-05-19 NOTE — Anesthesia Procedure Notes (Signed)
 Procedure Name: Intubation Date/Time: 05/19/2024 7:40 AM  Performed by: Trudy Rankin LABOR, CRNAPre-anesthesia Checklist: Patient identified, Patient being monitored, Timeout performed, Emergency Drugs available and Suction available Patient Re-evaluated:Patient Re-evaluated prior to induction Oxygen  Delivery Method: Circle System Utilized Preoxygenation: Pre-oxygenation with 100% oxygen  Induction Type: IV induction and Rapid sequence Laryngoscope Size: Mac, 3 and McGrath Grade View: Grade I Tube type: Oral Tube size: 7.0 mm Number of attempts: 1 Airway Equipment and Method: Stylet Placement Confirmation: ETT inserted through vocal cords under direct vision, positive ETCO2 and breath sounds checked- equal and bilateral Secured at: 21 cm Tube secured with: Tape Dental Injury: Teeth and Oropharynx as per pre-operative assessment

## 2024-05-19 NOTE — Inpatient Diabetes Management (Signed)
 Inpatient Diabetes Program Recommendations  AACE/ADA: New Consensus Statement on Inpatient Glycemic Control   Target Ranges:  Prepandial:   less than 140 mg/dL      Peak postprandial:   less than 180 mg/dL (1-2 hours)      Critically ill patients:  140 - 180 mg/dL    Latest Reference Range & Units 05/19/24 06:23 05/19/24 10:14  Glucose-Capillary 70 - 99 mg/dL 839 (H) 824 (H)    92/82/74 00:00  Hemoglobin A1C 7.7 (E)  (E): External lab result  Review of Glycemic Control  Diabetes history: DM2 Outpatient Diabetes medications: Farxiga  10 mg QAM, Ozempic  1 mg Qweek, Tresiba 85 units QAM, Novolog  14 units with meals (per Endocrinology office note also prescribed Metformin  500 mg BID) Current orders for Inpatient glycemic control: Novolog  0-15 units TID with meals, Novolog  0-5 units QHS  Inpatient Diabetes Program Recommendations:    Insulin : Please consider ordering insulin  glargine 26 units QHS (based on 104.3 kg x 0.25 units).   NOTE: Patient currently in PO following knee surgery today. Noted patient received Decadron  8 mg at 6:30 am and Decadron  4 mg at 7:40 am today. In reviewing chart noted patient sees Dr. Cherilyn (Endocrinologist) and was seen on 03/27/24 and A1C was 7.7% at that time. Per office note on 03/27/24 patient is prescribed Farxiga  10 mg daily, Ozempic  1 mg Qweek, Metformin  500 mg BID, Tresiba 85 units QAM, and Novolog  14 units with meals plus correction.   Thanks, Earnie Gainer, RN, MSN, CDCES Diabetes Coordinator Inpatient Diabetes Program [phone] (Team Pager from 8am to 5pm)

## 2024-05-19 NOTE — H&P (Signed)
 History of Present Illness: The patient is an 73 y.o. female seen in clinic today for follow-up evaluation of her left knee. Patient has severe pain in the anterior medial aspect of her left knee which is limiting function. Reports pain up to an 8 out of 10 which prevent her from walking with severe stiffness and instability sensations in the left knee to prevent her from bearing weight. She is functionally limited due to her left knee and despite conservative measures including pain control with medications and injections in the past. At this point she is ready for more permanent solution for her left knee arthritis and is here today to discuss moving forward with knee replacement. The patient denies fevers, chills, numbness, tingling, shortness of breath, chest pain, recent illness, or any trauma.  Patient is a non-smoker, diabetic with an A1c of 7.7, with a BMI of 42  Past Medical History: Past Medical History:  Diagnosis Date  Charcot arthropathy  Hypercholesteremia  Hypertension  OSA (obstructive sleep apnea)  Type 2 diabetes mellitus (CMS/HHS-[name])   Past Surgical History: Past Surgical History:  Procedure Laterality Date  HYSTERECTOMY SUPRACERVICAL ABDOMINAL W/REMOVAL TUBES &/OR OVARIES 1974  APPENDECTOMY 1985  foot surgery Left 10/06/2016  'tried to straighten out my foot'  abdominal hernia  SPINE SURGERY 1980's  cervical decompression   Past Family History: Family History  Problem Relation Age of Onset  Aortic aneurysm Mother  Myocardial Infarction (Heart attack) Maternal Grandfather  Diabetes Maternal Grandfather  No Known Problems Father  No Known Problems Maternal Grandmother  No Known Problems Paternal Grandmother  No Known Problems Paternal Grandfather   Medications: Current Outpatient Medications  Medication Sig Dispense Refill  acetaminophen  (TYLENOL ) 650 MG ER tablet Take 1,300 mg by mouth every 8 (eight) hours as needed for Pain  alcohol swabs (ALCOHOL PADS)  PadM Use to clean the skin prior to insulin  injections and prior to testing blood glucose 800 each 3  amLODIPine  (NORVASC ) 5 MG tablet Take 5 mg by mouth once daily  ARIPiprazole  (ABILIFY ) 2 MG tablet Take 2 mg by mouth once daily  ascorbic acid , vitamin C , (VITAMIN C ) 1000 MG tablet Take 1,000 mg by mouth once daily  aspirin  81 MG EC tablet Take 81 mg by mouth once daily.   blood-glucose sensor (FREESTYLE LIBRE 3 PLUS SENSOR) Devi Use 1 each every 15 (fifteen) days  buPROPion  (WELLBUTRIN  XL) 150 MG XL tablet Take 1 tablet by mouth once daily  cholecalciferol  (VITAMIN D3) 1000 unit tablet Take 1 capsule by mouth once daily  cyanocobalamin  (VITAMIN B12) 500 MCG tablet Take 500 mcg by mouth once daily (Patient not taking: Reported on 03/27/2024)  diphenhydrAMINE  (BENADRYL ) 25 mg capsule Take 25 mg by mouth every 6 (six) hours as needed  ezetimibe  (ZETIA ) 10 mg tablet Take 1 tablet by mouth once daily  FARXIGA  10 mg tablet Take 10 mg by mouth once daily  flash glucose sensor (FREESTYLE LIBRE 2 SENSOR) Kit FOR GLUCOSE MONITORING (Patient not taking: Reported on 03/27/2024) 6 each 1  FUROsemide  (LASIX ) 20 MG tablet Take 1 tablet (20 mg total) by mouth daily.  glucosamine/chondr su A sod (OSTEO BI-FLEX ORAL) Take by mouth  icosapent  ethyL (VASCEPA ) 1 gram capsule Take 2 g by mouth 2 (two) times daily  insulin  ASPART (NOVOLOG  FLEXPEN) pen injector (concentration 100 units/mL) 10 units plus sliding scale with each meal. Max daily dose 50 units. (Patient taking differently: 12 Units 10 units plus sliding scale with each meal. Max daily dose 50 units.)  15 mL 12  L. rhamnosus GG/inulin (CULTURELLE PROBIOTICS ORAL) Take by mouth once daily  lancets Use 1 each 4 (four) times daily Use as instructed. 400 each 6  metoprolol  TARTrate (LOPRESSOR ) 50 MG tablet Take 50 mg by mouth 2 (two) times daily  omeprazole  (PRILOSEC) 40 MG DR capsule Take 40 mg by mouth once daily.   pen needle, diabetic (PEN NEEDLE) 31  gauge x 5/16 needle Use one each five times daily 200 each 11  pregabalin  (LYRICA ) 150 MG capsule Take 1 capsule by mouth twice daily 60 capsule 5  rosuvastatin  (CRESTOR ) 40 MG tablet Take 1 tablet (40 mg total) by mouth once daily 90 tablet 3  semaglutide  (OZEMPIC ) 1 mg/dose (4 mg/3 mL) pen injector Inject 0.75 mLs (1 mg total) subcutaneously every 7 (seven) days 3 mL 12  sertraline  (ZOLOFT ) 100 MG tablet Take 1 tablet (100 mg total) by mouth once daily 30 tablet 11  traZODone  (DESYREL ) 100 MG tablet Take 100 mg by mouth at bedtime as needed  TRESIBA FLEXTOUCH U-200 pen injector (concentration 200 units/mL) Inject 100 Units subcutaneously once daily (Patient taking differently: Inject 100 Units subcutaneously once daily 78 units) 18 mL 12   No current facility-administered medications for this visit.   Allergies: Allergies  Allergen Reactions  Codeine Other (See Comments) and Unknown  'feels out of it'   Other reaction(s): Other (See Comments), Unknown  TRIPPED OUT DIDN'T LIKE THE WAY IT FELT  I trip out  TRIPPED OUT DIDN'T LIKE THE WAY IT FELT  I trip out  'feels out of it' Other reaction(s): Other (See Comments), Unknown TRIPPED OUT DIDN'T LIKE THE WAY IT FELT 'feels out of it' I trip out TRIPPED OUT DIDN'T LIKE THE WAY IT FELT  Other reaction(s): Other (See Comments), Unknown TRIPPED OUT DIDN'T LIKE THE WAY IT FELT 'feels out of it' I trip out TRIPPED OUT DIDN'T LIKE THE WAY IT FELT  Atorvastatin Other (See Comments)  muscle pain  Other reaction(s): Other (See Comments)  muscle pain  muscle pain Other reaction(s): Other (See Comments) muscle pain muscle pain muscle pain  Other reaction(s): Other (See Comments) muscle pain muscle pain muscle pain  Latex, Natural Rubber Rash  Hydrocodone  Other (See Comments)  Tramadol  Other (See Comments)  Latex Rash  Zolpidem  Other (See Comments)  Sleep walk  Other reaction(s): Other (See Comments), Other (See  Comments)  I do things in my sleep that I dont remember  Sleep walk  I do things in my sleep that I dont remember  Sleep walk Other reaction(s): Other (See Comments), Other (See Comments) Sleep walk Sleep walk I do things in my sleep that I dont remember Sleep walk  Other reaction(s): Other (See Comments), Other (See Comments) Sleep walk Sleep walk I do things in my sleep that I dont remember Sleep walk    Visit Vitals: Vitals:  05/14/24 1147  BP: (!) 128/90    Review of Systems:  A comprehensive 14 point ROS was performed, reviewed, and the pertinent orthopaedic findings are documented in the HPI.  Physical Exam: General/Constitutional: No apparent distress: well-nourished and well developed. Eyes: Pupils equal, round with synchronous movement. Pulmonary exam: Lungs clear to auscultation bilaterally no wheezing rales or rhonchi Cardiac exam: Regular rate and rhythm no obvious murmurs rubs or gallops. Integumentary: No impressive skin lesions present, except as noted in detailed exam. Neuro/Psych: Normal mood and affect, oriented to person, place and time.  Comprehensive Knee Exam: Gait Unable to ambulate due to severe  pain in the left knee  Alignment Neutral   Inspection Left  Skin Normal appearance with no obvious deformity. No ecchymosis or erythema.  Soft Tissue No focal soft tissue swelling  Quad Atrophy None   Palpation  Left  Tenderness Medial joint line tenderness to palpation which reproduces pain  Crepitus + patellofemoral and tibiofemoral crepitus  Effusion None   Range of Motion Left  Flexion 10-100  Extension 10 degree flexion contracture not correctable   Ligamentous Exam Left  Lachman Normal  Valgus 0 Normal  Valgus 30 Normal  Varus 0 Normal  Varus 30 Normal  Anterior Drawer Normal  Posterior Drawer Normal   Meniscal Exam Left  Hyperflexion Test Positive  Hyperextension Test Positive  McMurray's Positive   Neurovascular Left   Quadriceps Strength 5/5  Hamstring Strength 5/5  Hip Abductor Strength 4/5  Distal Motor Normal  Distal Sensory Normal light touch sensation  Distal Pulses Normal     Imaging Studies: I have reviewed AP, lateral,sunrise, and flexed PA weight bearing knee X-rays (4 views) of the left knee ordered and taken today in the office show severe degenerative changes with bone-on-bone articulation medially and tricompartmental osteophyte formation and sclerosis and anterior subluxation of the tibia relative to the femur. Overall Lawrence grade 4. AP, sunrise, and flexed PA of the right knee also show severe degenerative changes with tricompartmental osteophyte formation sclerosis and medial bone-on-bone articulation. Kellgren-Lawrence grade 4. No fractures or dislocations noted in either knee.  Assessment:  ICD-10-CM  1. Primary osteoarthritis of left knee M17.12    Plan: Danilynn is 73 year old female presents with left knee bone on bone arthritis. Based upon the patient's continued symptoms and failure to respond to conservative treatment, I have recommended a left total knee replacement for this patient. A long discussion took place with the patient describing what a total joint replacement is and what the procedure would entail. A knee model, similar to the implants that will be used during the operation, was utilized to demonstrate the implants. Choices of implant manufactures were discussed and reviewed. The ability to secure the implant utilizing cement or cementless (press fit) fixation was discussed. The approach and exposure was discussed.   The hospitalization and post-operative care and rehabilitation were also discussed. The use of perioperative antibiotics and DVT prophylaxis were discussed. The risk, benefits and alternatives to a surgical intervention were discussed at length with the patient. The patient was also advised of risks related to the medical comorbidities and elevated body mass  index (BMI). A lengthy discussion took place to review the most common complications including but not limited to: stiffness, loss of function, complex regional pain syndrome, deep vein thrombosis, pulmonary embolus, heart attack, stroke, infection, wound breakdown, numbness, intraoperative fracture, damage to nerves, tendon,muscles, arteries or other blood vessels, death and other possible complications from anesthesia. The patient was told that we will take steps to minimize these risks by using sterile technique, antibiotics and DVT prophylaxis when appropriate and follow the patient postoperatively in the office setting to monitor progress. The possibility of recurrent pain, no improvement in pain and actual worsening of pain were also discussed with the patient.  We discussed her increased risk of potential complications due to elevated BMI however given the severe nature of her knee condition and under shared decision making model the patient is willing to move forward with the surgery despite some increased risks.  Patient asked about and confirms no history of any reactions to metal or metal allergy in  the past.  The discharge plan of care focused on the patient going home following surgery. The patient was encouraged to make the necessary arrangements to have someone stay with them when they are discharged home.   The benefits of surgery were discussed with the patient including the potential for improving the patient's current clinical condition through operative intervention. Alternatives to surgical intervention including continued conservative management were also discussed in detail. All questions were answered to the satisfaction of the patient. The patient participated and agreed to the plan of care as well as the use of the recommended implants for their total knee replacement surgery. An information packet was given to the patient to review prior to surgery.   Patient received clearance  for surgery. All questions answered she agrees above plan move forward with left total knee replacement.   Portions of this record have been created using Scientist, clinical (histocompatibility and immunogenetics). Dictation errors have been sought, but may not have been identified and corrected.  Arthea Sheer MD

## 2024-05-19 NOTE — Transfer of Care (Signed)
 Immediate Anesthesia Transfer of Care Note  Patient: Summer [name]  Procedure(s) Performed: ARTHROPLASTY, KNEE, TOTAL (Left: Knee)  Patient Location: PACU  Anesthesia Type:General  Level of Consciousness: sedated, drowsy, and responds to stimulation. Oral airway in place  Airway & Oxygen  Therapy: Patient Spontanous Breathing and Patient connected to nasal cannula oxygen   Post-op Assessment: Report given to RN and Post -op Vital signs reviewed and stable  Post vital signs: Reviewed and stable  Last Vitals:  Vitals Value Taken Time  BP 154/55 05/19/24 10:07  Temp 98.9 f 1007  Pulse 79 05/19/24 10:09  Resp 19 05/19/24 10:09  SpO2 98 % 05/19/24 10:09  Vitals shown include unfiled device data.  Last Pain:  Vitals:   05/19/24 0640  PainSc: 9          Complications: No notable events documented.

## 2024-05-19 NOTE — Anesthesia Preprocedure Evaluation (Signed)
 Anesthesia Evaluation  Patient identified by MRN, date of birth, ID band Patient awake    Reviewed: Allergy & Precautions, H&P , NPO status , Patient's Chart, lab work & pertinent test results, reviewed documented beta blocker date and time   Airway Mallampati: II  TM Distance: >3 FB Neck ROM: full    Dental no notable dental hx. (+) Poor Dentition, Implants   Pulmonary neg pulmonary ROS, shortness of breath and with exertion, asthma , sleep apnea , pneumonia, resolved, COPD   Pulmonary exam normal breath sounds clear to auscultation       Cardiovascular Exercise Tolerance: Good hypertension, + CAD, + Peripheral Vascular Disease and +CHF  negative cardio ROS Normal cardiovascular exam+ dysrhythmias  Rhythm:regular Rate:Normal     Neuro/Psych  Headaches, Seizures -,  PSYCHIATRIC DISORDERS       Neuromuscular disease negative neurological ROS  negative psych ROS   GI/Hepatic negative GI ROS, Neg liver ROS, hiatal hernia,GERD  Medicated,,  Endo/Other  negative endocrine ROSdiabetesHypothyroidism  Class 3 obesity  Renal/GU Renal disease     Musculoskeletal   Abdominal   Peds  Hematology negative hematology ROS (+) Blood dyscrasia, anemia   Anesthesia Other Findings Past Medical History: No date: (HFpEF) heart failure with preserved ejection fraction ([name]) No date: Anemia No date: Aortic atherosclerosis ([name]) No date: Arthritis No date: Asthma No date: B12 deficiency No date: Back pain No date: CAD (coronary artery disease) No date: Carpal tunnel syndrome No date: Cerebral microvascular disease No date: Charcot foot due to diabetes mellitus ([name]) No date: CKD (chronic kidney disease), stage IV ([name]) No date: Complication of anesthesia     Comment:  a.) early emergence No date: Constipation No date: COPD (chronic obstructive pulmonary disease) ([name]) No date: Depression No date: Diabetic peripheral neuropathy  ([name]) No date: Dyspnea No date: GERD (gastroesophageal reflux disease) No date: Headache No date: Hiatal hernia No date: History of kidney stones No date: Hyperlipidemia No date: Hypertension No date: Hypothyroidism No date: IBS (irritable bowel syndrome) No date: Insomnia     Comment:  a.) takes trazodone  PRN No date: Joint pain No date: Long-term use of aspirin  therapy No date: Memory loss No date: Obesity No date: OSA (obstructive sleep apnea)     Comment:  a.) does not utilize nocturnal PAP therapy No date: PAD (peripheral artery disease) ([name]) No date: Pneumonia No date: Posterior reversible encephalopathy syndrome No date: Rhinitis, allergic No date: Rosacea No date: SOB (shortness of breath) No date: T2DM (type 2 diabetes mellitus) ([name]) No date: Unspecified hearing loss No date: Vitamin D  deficiency  Past Surgical History: 1975: ABDOMINAL HYSTERECTOMY 05/02/2023: AMPUTATION TOE; Left     Comment:  Procedure: LEFT FOOT PARTIAL AMPUTATION 1st & 2nd TOE;                Surgeon: Silva Juliene SAUNDERS, DPM;  Location: [facility] ORS;                Service: Orthopedics/Podiatry;  Laterality: Left; approx Jan 2018: ANKLE SURGERY; Left 1970: APPENDECTOMY 01/2011: CATARACT EXTRACTION; Bilateral     Comment:  right 08/19/2019: COLONOSCOPY WITH PROPOFOL ; N/A     Comment:  Procedure: COLONOSCOPY WITH PROPOFOL ;  Surgeon: Therisa Bi, MD;  Location: [facility] END[facility];  Service:               Gastroenterology;  Laterality: N/A; 2013: eye lid surgery     Comment:  bilateral No date: FOOT SURGERY 11/23/2020: LOWER EXTREMITY ANGIOGRAPHY; Left     Comment:  Procedure: LOWER EXTREMITY ANGIOGRAPHY;  Surgeon:               Jama Cordella MATSU, MD;  Location: [facility] INV[facility]IVE [facility];               Service: Cardiovascular;  Laterality: Left; No date: NECK SURGERY No date: SPINE SURGERY 08/16/2022: TEE WITHOUT CARDIOVERSION; N/A     Comment:  Procedure: TRANSESOPHAGEAL ECHOCARDIOGRAM  (TEE);                Surgeon: Darliss Rogue, MD;  Location: [facility] ORS;                Service: Cardiovascular;  Laterality: N/A; No date: TUBAL LIGATION 1989: VAGINAL HYSTERECTOMY  BMI    Body Mass Index: 42.07 kg/m      Reproductive/Obstetrics negative OB ROS                              Anesthesia Physical Anesthesia Plan  ASA: 3  Anesthesia Plan: General ETT   Post-op Pain Management:    Induction: Intravenous  PONV Risk Score and Plan: 3 and Ondansetron  and Dexamethasone   Airway Management Planned: Oral ETT  Additional Equipment:   Intra-op Plan:   Post-operative Plan: Extubation in OR  Informed Consent: I have reviewed the patients History and Physical, chart, labs and discussed the procedure including the risks, benefits and alternatives for the proposed anesthesia with the patient or authorized representative who has indicated his/her understanding and acceptance.     Dental Advisory Given  Plan Discussed with: Anesthesiologist, CRNA and Surgeon  Anesthesia Plan Comments: (Patient consented for risks of anesthesia including but not limited to:  - adverse reactions to medications - damage to eyes, teeth, lips or other oral mucosa - nerve damage due to positioning  - sore throat or hoarseness - Damage to heart, brain, nerves, lungs, other parts of body or loss of life  Patient voiced understanding and assent.)        Anesthesia Quick Evaluation

## 2024-05-19 NOTE — Op Note (Signed)
 Patient Name: Summer [name]  FMW:[id number]  Pre-Operative Diagnosis: Left knee Osteoarthritis  Post-Operative Diagnosis: (same)  Procedure: Left Total Knee Arthroplasty  Components/Implants: Femur: Persona Size 5 CR   Tibia: Persona Size D w/ 14x17mm stem extension  Poly: 10mm MC  Patella: 32x8.14mm symmetric  Femoral Valgus Cut Angle: 5 degrees  Distal Femoral Re-cut: none  Patella Resurfacing: yes   Date of Surgery: 05/19/2024  Surgeon: Arthea Sheer MD  Assistant: Debby Amber PA (present and scrubbed throughout the case, critical for assistance with exposure, retraction, instrumentation, and closure)   Anesthesiologist: Leavy  Anesthesia: General   Tourniquet Time: 82 min  EBL: 75cc  IVF: 800cc  Complications: None   Brief history: The patient is a 73 year old female with a history of osteoarthritis of the left knee with pain limiting their range of motion and activities of daily living, which has failed multiple attempts at conservative therapy.  The risks and benefits of total knee arthroplasty as definitive surgical treatment were discussed with the patient, who opted to proceed with the operation.  After outpatient medical clearance and optimization was completed the patient was admitted to [facility] for the procedure.  All preoperative films were reviewed and an appropriate surgical plan was made prior to surgery. Preoperative range of motion was 10 to 100 with a 10 deg flexion contracture.  Description of procedure: The patient was brought to the operating room where laterality was confirmed by all those present to be the left side.   Spinal anesthesia was administered and the patient received an intravenous dose of antibiotics for surgical prophylaxis and a dose of tranexamic acid .  Patient is positioned supine on the operating room table with all bony prominences well-padded.  A well-padded tourniquet was applied to the left thigh.  The  knee was then prepped and draped in usual sterile fashion with multiple layers of adhesive and nonadhesive drapes.  All of those present in the operating room participated in a surgical timeout laterality and patient were confirmed.   An Esmarch was wrapped around the extremity and the leg was elevated and the knee flexed.  The tourniquet was inflated to a pressure of 275 mmHg. The Esmarch was removed and the leg was brought down to full extension.  The patella and tibial tubercle identified and outlined using a marking pen and a midline skin incision was made with a knife carried through the subcutaneous tissue down to the extensor retinaculum.  After exposure of the extensor mechanism the medial parapatellar arthrotomy was performed with a scalpel and electrocautery extending down medial and distal to the tibial tubercle taking care to avoid incising the patellar tendon.   A standard medial release was performed over the proximal tibia.  The knee was brought into extension in order to excise the fat pad taking care not to damage the patella tendon.  The superior soft tissue was removed from the anterior surface of the distal femur to visualize for the procedure.  The knee was then brought into flexion with the patella subluxed laterally and subluxing the tibia anteriorly. The PCL and ACL were found to be completely denuded and in discontinuity except for some posterior fibers with bony overgrowth. The remaining tissue was carefully removed and the anterior tibia was exposed. There was found to be significant bone loss posteromedially with subluxation of the tibia anteriorly.   An extramedullary tibial cutting guide was then applied to the leg with a spring-loaded ankle clamp placed around the distal tibia just above  the malleoli the angulation of the guide was adjusted to give some posterior slope in the tibial resection with an appropriate varus/valgus alignment.  The resection guide was then pinned to the  proximal tibia and the proximal tibial surface was resected with an oscillating saw.  Careful attention was paid to ensure the blade did not disrupt any of the soft tissues including any lateral or medial ligament.  Attention was then turned to the femur, with the knee slightly flexed a opening drill was used to enter the medullary canal of the femur.  After removing the drill marrow was suctioned out to decompress the distal femur.  An intramedullary femoral guide was then inserted into the drill hole and the alignment guide was seated firmly against the distal end of the medial femoral condyle.  The distal femoral cutting guide was then attached and pinned securely to the anterior surface of the femur and the intramedullary rod and alignment guide was removed.  Distal femur resection was then performed with an oscillating saw with retractors protecting medial and laterally.   The distal cutting block was then removed and the extension gap was checked with a spacer.  Extension gap was found to be appropriately sized to accommodate the spacer block.   The femoral sizing guide was then placed securely into the posterior condyles of the femur and the femoral size was measured and determined to be 5.  The size 5; 4-in-1 cutting guide was placed in position and secured with 2 pins.  The anterior posterior and chamfer resections were then performed with an oscillating saw.  Bony fragments and osteophytes were then removed.  Using a lamina spreader the posterior medial and lateral condyles were checked for additional osteophytes and posterior soft tissue remnants.  Any remaining meniscus was removed at this time.  Periarticular injection was performed in the meniscal rims and posterior capsule with aspiration performed to ensure no intravascular injection.   The tibia was then exposed and the tibial trial was pinned onto the plateau after confirming appropriate orientation and rotation.  Using the drill bushing  the tibia was prepared to the appropriate drill depth.  Tibial broach impactor was then driven through the punch guide using a mallet.  The femoral trial component was then inserted onto the femur. A trial tibial polyethylene bearing was then placed and the knee was reduced.  The knee achieved full extension with no hyperextension and was found to be balanced in flexion and extension with the trials in place.  The knee was then brought into full extension the patella was everted and held with 2 Kocher clamps.  The articular surface of the patella was then resected with an patella reamer and saw after careful measurement with a caliper.  The patella was then prepared with the drill guide and a trial patella was placed.  The knee was then taken through range of motion and it was found that the patella articulated appropriately with the trochlea and good patellofemoral motion without subluxation.    The correct final components for implantation were confirmed and opened by the circulator nurse.  A bone plug was fashioned from the patient's bone cuts to plug the intramedullary femoral canal access hole and was tamped into place.  The prepared surfaces of the patella femur and tibia were cleaned with pulsatile lavage to remove all blood fat and other material and then the surfaces were dried.  2 bags of cement were mixed under vacuum and the components were cemented into place.  Excess cement was removed with curettes and forceps. A trial polyethylene tibial component was placed and the knee was brought into extension to allow the cement to set.  At this time the periarticular injection cocktail was placed in the soft tissues surrounding the knee.  After full curing of the cement the balance of the knee was checked again and the final polyethylene size was confirmed. The tibial component was irrigated and locking mechanism checked to ensure it was clear of debris. The real polyethylene tibial component was implanted  and the knee was brought through a range of motion.   The knee was then irrigated with copious amount of normal saline via pulsatile lavage to remove all loose bodies and other debris.  The knee was then irrigated with surgiphor betadine based wash and reirrigated with saline.  The tourniquet was then dropped and all bleeding vessels were identified and coagulated.  The arthrotomy was approximated with #1 Vicryl and closed with #1 Stratafix suture.  The knee was brought into slight flexion and the subcutaneous tissues were closed with 0 Vicryl, 2-0 Vicryl and a running subcuticular 4-0 stratafix barbed suture.  Skin was then glued with Dermabond.  A sterile adhesive dressing was then placed along with a sequential compression device to the calf, a Ted stocking, and a cryotherapy cuff.   Sponge, needle, and Lap counts were all correct at the end of the case.   The patient was transferred off of the operating room table to a hospital bed, good pulses were found distally on the operative side.  The patient was transferred to the recovery room in stable condition.

## 2024-05-19 NOTE — Anesthesia Postprocedure Evaluation (Signed)
 Anesthesia Post Note  Patient: Summer [name]  Procedure(s) Performed: ARTHROPLASTY, KNEE, TOTAL (Left: Knee)  Patient location during evaluation: PACU Anesthesia Type: General Level of consciousness: awake and alert Pain management: pain level controlled Vital Signs Assessment: post-procedure vital signs reviewed and stable Respiratory status: spontaneous breathing, nonlabored ventilation, respiratory function stable and patient connected to nasal cannula oxygen  Cardiovascular status: blood pressure returned to baseline and stable Postop Assessment: no apparent nausea or vomiting Anesthetic complications: no   No notable events documented.   Last Vitals:  Vitals:   05/19/24 1115 05/19/24 1143  BP: (!) 141/64 (!) 150/62  Pulse: 76   Resp: 13 13  Temp:  36.8 C  SpO2: 94% 90%    Last Pain:  Vitals:   05/19/24 1143  TempSrc: Oral  PainSc: 5                  Debby Mines

## 2024-05-19 NOTE — Discharge Instructions (Addendum)
 Instructions after Total Knee Replacement   Reinaldo Berber M.D.     Dept. of Orthopaedics & Sports Medicine  [facility]  [address]  [place], [place]  [address]  Phone: [phone]   Fax: [phone]    DIET: Drink plenty of non-alcoholic fluids. Resume your normal diet. Include foods high in fiber.  ACTIVITY:  You may use crutches or a walker with weight-bearing as tolerated, unless instructed otherwise. You may be weaned off of the walker or crutches by your Physical Therapist.  Do NOT place pillows under the knee. Anything placed under the knee could limit your ability to straighten the knee.   Continue doing gentle exercises. Exercising will reduce the pain and swelling, increase motion, and prevent muscle weakness.   Please continue to use the TED compression stockings for 2 weeks. You may remove the stockings at night, but should reapply them in the morning. Do not drive or operate any equipment until instructed.  WOUND CARE:  Continue to use the PolarCare or ice packs periodically to reduce pain and swelling. You may begin showering 3 days after surgery with honeycomb dressing. Remove honeycomb dressing 7 days after surgery and continue showering. Allow dermabond to fall off on its own.  MEDICATIONS: You may resume your regular medications. Please take the pain medication as prescribed on the medication. Do not take pain medication on an empty stomach. You have been given a prescription for a blood thinner (Lovenox or Coumadin). Please take the medication as instructed. (NOTE: After completing a 2 week course of Lovenox, take one 81 mg Enteric-coated aspirin twice a day for 3 additional weeks. This along with elevation will help reduce the possibility of phlebitis in your operated leg.) Do not drive or drink alcoholic beverages when taking pain medications.  POSTOPERATIVE CONSTIPATION PROTOCOL Constipation - defined medically as fewer than three stools per  week and severe constipation as less than one stool per week.  One of the most common issues patients have following surgery is constipation.  Even if you have a regular bowel pattern at home, your normal regimen is likely to be disrupted due to multiple reasons following surgery.  Combination of anesthesia, postoperative narcotics, change in appetite and fluid intake all can affect your bowels.  In order to avoid complications following surgery, here are some recommendations in order to help you during your recovery period.  Colace (docusate) - Pick up an over-the-counter form of Colace or another stool softener and take twice a day as long as you are requiring postoperative pain medications.  Take with a full glass of water daily.  If you experience loose stools or diarrhea, hold the colace until you stool forms back up.  If your symptoms do not get better within 1 week or if they get worse, check with your doctor.  Dulcolax (bisacodyl) - Pick up over-the-counter and take as directed by the product packaging as needed to assist with the movement of your bowels.  Take with a full glass of water.  Use this product as needed if not relieved by Colace only.   MiraLax (polyethylene glycol) - Pick up over-the-counter to have on hand.  MiraLax is a solution that will increase the amount of water in your bowels to assist with bowel movements.  Take as directed and can mix with a glass of water, juice, soda, coffee, or tea.  Take if you go more than two days without a movement. Do not use MiraLax more than once per day.  Call your doctor if you are still constipated or irregular after using this medication for 7 days in a row.  If you continue to have problems with postoperative constipation, please contact the office for further assistance and recommendations.  If you experience "the worst abdominal pain ever" or develop nausea or vomiting, please contact the office immediatly for further recommendations for  treatment.   CALL THE OFFICE FOR: Temperature above 101 degrees Excessive bleeding or drainage on the dressing. Excessive swelling, coldness, or paleness of the toes. Persistent nausea and vomiting.  FOLLOW-UP:  You should have an appointment to return to the office in 14 days after surgery. Arrangements have been made for continuation of Physical Therapy (either home therapy or outpatient therapy).

## 2024-05-19 NOTE — Plan of Care (Signed)
  Problem: Activity: Goal: Ability to avoid complications of mobility impairment will improve Outcome: Progressing Goal: Range of joint motion will improve Outcome: Progressing   Problem: Activity: Goal: Range of joint motion will improve Outcome: Progressing   Problem: Clinical Measurements: Goal: Postoperative complications will be avoided or minimized Outcome: Progressing   Problem: Pain Management: Goal: Pain level will decrease with appropriate interventions Outcome: Progressing

## 2024-05-19 NOTE — Evaluation (Signed)
 Physical Therapy Evaluation Patient Details Name: Summer [name] MRN: [id number] DOB: October 05, 1950 Today's Date: 05/19/2024  History of Present Illness  Pt is a 73 y.o. female s/p L TKA on 05/19/24.  Clinical Impression  Pt admitted with above diagnosis. Pt currently with functional limitations due to the deficits listed below (see PT Problem List). Pt received upright in bed agreeable to PT. Pt reports receiving oxycodone  prior to PT and does appear drowsy falling asleep 2-3 times during subjective reporting and room set up. PTA pt reports being mod-I with gait using 4WW and for ADL completion.   To date, reviewed education as listed below. Pt moving at supervision for supine to sitting, STS to RW and ambulation at bedside ~5'. Deferring further gait due to NSG reporting x2 gait bouts to bathroom prior to PT and now new drowsiness from meds. Pt prefers going back to bed due to drowsiness to sleep thus allowing pt return to supine. L knee propped in extension with towel roll at ankle. Reviewed HEP exercises as listed below with hand out provided. Pt with all needs in reach. Pending gait progression anticipate pt will be appropriate for d/c rec as listed to maximize ROM, strength, and return to functional mobility to PLOF.     If plan is discharge home, recommend the following: A little help with bathing/dressing/bathroom;Assistance with cooking/housework;Assist for transportation;Help with stairs or ramp for entrance   Can travel by private vehicle        Equipment Recommendations Rolling walker (2 wheels)  Recommendations for Other Services       Functional Status Assessment Patient has had a recent decline in their functional status and demonstrates the ability to make significant improvements in function in a reasonable and predictable amount of time.     Precautions / Restrictions Precautions Precautions: Knee Precaution Booklet Issued: Yes (comment) Restrictions Weight Bearing  Restrictions Per Provider Order: Yes LLE Weight Bearing Per Provider Order: Weight bearing as tolerated      Mobility  Bed Mobility Overal bed mobility: Modified Independent               Patient Response: Cooperative  Transfers Overall transfer level: Needs assistance Equipment used: Rolling walker (2 wheels) Transfers: Sit to/from Stand Sit to Stand: Supervision           General transfer comment: safe hand placement    Ambulation/Gait Ambulation/Gait assistance: Supervision Gait Distance (Feet): 5 Feet Assistive device: Rolling walker (2 wheels) Gait Pattern/deviations: Step-to pattern       General Gait Details: pt performing gait at bedside. Deferred further distances due to medication lethargy and also x2 gait bouts to bathroom with NSG reported.  Stairs            Wheelchair Mobility     Tilt Bed Tilt Bed Patient Response: Cooperative  Modified Rankin (Stroke Patients Only)       Balance Overall balance assessment: Needs assistance Sitting-balance support: Bilateral upper extremity supported, Feet supported Sitting balance-Leahy Scale: Good     Standing balance support: During functional activity, No upper extremity supported Standing balance-Leahy Scale: Fair Standing balance comment: stood at bedside without UE support                             Pertinent Vitals/Pain Pain Assessment Pain Assessment: Faces Faces Pain Scale: No hurt    Home Living Family/patient expects to be discharged to:: Private residence Living Arrangements: Spouse/significant other Available Help at  Discharge: Family;Available 24 hours/day Type of Home: House Home Access: Ramped entrance       Home Layout: One level Home Equipment: Rollator (4 wheels);Toilet riser;Shower seat      Prior Function Prior Level of Function : Independent/Modified Independent             Mobility Comments: Independent but also clarifies using rollator.  Mod-I with rollator? ADLs Comments: Mod-I     Extremity/Trunk Assessment   Upper Extremity Assessment Upper Extremity Assessment: Overall WFL for tasks assessed    Lower Extremity Assessment Lower Extremity Assessment: Generalized weakness;LLE deficits/detail LLE Deficits / Details: expected ROM and strength deficits post op LLE Sensation: WNL       Communication   Communication Communication: No apparent difficulties    Cognition Arousal: Lethargic Behavior During Therapy: WFL for tasks assessed/performed   PT - Cognitive impairments: No apparent impairments                       PT - Cognition Comments: Pt reports having oxycodone  prior to PT leading to drowsiness. x3 instances pt falling asleep during subjective reporting. Following commands: Intact       Cueing Cueing Techniques: Verbal cues, Gestural cues, Tactile cues     General Comments General comments (skin integrity, edema, etc.): L knee with bandage donned    Exercises Total Joint Exercises Ankle Circles/Pumps: AROM, Both, 10 reps, Supine Quad Sets: AROM, Strengthening, Left, 10 reps, Supine Heel Slides: AROM, Strengthening, Left, 5 reps, Supine Hip ABduction/ADduction: AROM, Strengthening, Left, 5 reps, Supine Goniometric ROM: 6-70 degrees Other Exercises Other Exercises: Role of PT in acute setting, d/c recs, DME needs, WB status, L knee positioning to prevent flexion contracture, HEP (reps/sets/frequency)   Assessment/Plan    PT Assessment Patient needs continued PT services  PT Problem List Decreased strength;Decreased range of motion;Decreased activity tolerance;Decreased balance;Decreased mobility       PT Treatment Interventions DME instruction;Balance training;Gait training;Neuromuscular re-education;Functional mobility training;Patient/family education;Therapeutic activities;Therapeutic exercise    PT Goals (Current goals can be found in the Care Plan section)  Acute Rehab PT  Goals Patient Stated Goal: to go home PT Goal Formulation: With patient Time For Goal Achievement: 06/02/24 Potential to Achieve Goals: Good    Frequency BID     Co-evaluation               AM-PAC PT 6 Clicks Mobility  Outcome Measure Help needed turning from your back to your side while in a flat bed without using bedrails?: A Little Help needed moving from lying on your back to sitting on the side of a flat bed without using bedrails?: A Little Help needed moving to and from a bed to a chair (including a wheelchair)?: A Little Help needed standing up from a chair using your arms (e.g., wheelchair or bedside chair)?: A Little Help needed to walk in hospital room?: A Little Help needed climbing 3-5 steps with a railing? : A Little 6 Click Score: 18    End of Session Equipment Utilized During Treatment: Gait belt Activity Tolerance: Patient tolerated treatment well Patient left: in bed;with call bell/phone within reach;with bed alarm set Nurse Communication: Mobility status PT Visit Diagnosis: Muscle weakness (generalized) (M62.81);Other abnormalities of gait and mobility (R26.89)    Time: 8565-8550 PT Time Calculation (min) (ACUTE ONLY): 15 min   Charges:   PT Evaluation $PT Eval Low Complexity: 1 Low   PT General Charges $$ ACUTE PT VISIT: 1 Visit  Dorina HERO. Fairly IV, PT, DPT Physical Therapist- [place]  [facility] 05/19/2024, 3:32 PM

## 2024-05-19 NOTE — Discharge Summary (Signed)
 Physician Discharge Summary  Patient ID: Summer [name] MRN: [id number] DOB/AGE: Oct 26, 1950 73 y.o.  Admit date: 05/19/2024 Discharge date:05/20/2024  Admission Diagnoses:  Primary osteoarthritis of left knee [M17.12] S/P TKR (total knee replacement), left [Z96.652]   Discharge Diagnoses: Patient Active Problem List   Diagnosis Date Noted   S/P TKR (total knee replacement), left 05/19/2024   Long-term use of aspirin  therapy    Secondary hyperparathyroidism of renal origin ([name]) 04/04/2024   CKD stage 3a, GFR 45-59 ml/min ([name]) 05/01/2023   Small vessel disease ([name]) 03/27/2023   Coronary artery disease involving native coronary artery of native heart without angina pectoris 03/16/2023   First degree AV block 03/16/2023   Seizure ([name]) 08/20/2022   Gouty arthritis 08/15/2022   Atherosclerosis of aorta ([name]) 07/04/2022   Insomnia due to other mental disorder 07/04/2022   Amputated toe of left foot ([name]) 07/04/2022   Chronic obstructive pulmonary disease ([name]) 01/28/2021   PAD (peripheral artery disease) ([name]) 12/03/2020   B12 deficiency 05/24/2020   Vitamin D  deficiency 05/24/2020   Metatarsalgia of left foot 01/15/2020   MDD (major depressive disorder), recurrent episode, moderate ([name]) 04/04/2019   (HFpEF) heart failure with preserved ejection fraction ([name]) 08/31/2017   Moderate persistent asthma 08/31/2017   Dyslipidemia associated with type 2 diabetes mellitus ([name]) 06/14/2017   Charcot foot due to diabetes mellitus ([name]) 02/22/2016   Acquired abduction deformity of foot 07/12/2015   Osteoarthritis of subtalar joint 07/12/2015   Carpal tunnel syndrome 04/17/2015   Chronic constipation 04/17/2015   Stage 3b chronic kidney disease ([name]) 04/17/2015   Decreased exercise tolerance 04/17/2015   Diabetes mellitus with polyneuropathy ([name]) 04/17/2015   Gastroesophageal reflux disease without esophagitis 04/17/2015   Bursitis, trochanteric 04/17/2015   Essential hypertension  04/17/2015   Hearing loss 04/17/2015   Chronic recurrent major depressive disorder ([name]) 04/17/2015   Morbid obesity with BMI of 40.0-44.9, adult ([name]) 04/17/2015   Hypo-ovarianism 04/17/2015   Perennial allergic rhinitis with seasonal variation 04/17/2015   Acne erythematosa 04/17/2015   Dyskinesia, tardive 04/17/2015   Impingement syndrome of shoulder 04/17/2015   Obstructive sleep apnea 05/14/2014   Mixed hyperlipidemia 02/06/2012   Low back pain 09/16/2008    Past Medical History:  Diagnosis Date   (HFpEF) heart failure with preserved ejection fraction ([name])    Anemia    Aortic atherosclerosis ([name])    Arthritis    Asthma    B12 deficiency    Back pain    CAD (coronary artery disease)    Carpal tunnel syndrome    Cerebral microvascular disease    Charcot foot due to diabetes mellitus ([name])    CKD (chronic kidney disease), stage IV ([name])    Complication of anesthesia    a.) early emergence   Constipation    COPD (chronic obstructive pulmonary disease) ([name])    Depression    Diabetic peripheral neuropathy ([name])    Dyspnea    GERD (gastroesophageal reflux disease)    Headache    Hiatal hernia    History of kidney stones    Hyperlipidemia    Hypertension    Hypothyroidism    IBS (irritable bowel syndrome)    Insomnia    a.) takes trazodone  PRN   Joint pain    Long-term use of aspirin  therapy    Memory loss    Obesity    OSA (obstructive sleep apnea)    a.) does not utilize nocturnal PAP therapy   PAD (peripheral artery disease) ([name])  Pneumonia    Posterior reversible encephalopathy syndrome    Rhinitis, allergic    Rosacea    SOB (shortness of breath)    T2DM (type 2 diabetes mellitus) ([name])    Unspecified hearing loss    Vitamin D  deficiency      Transfusion: none   Consultants (if any):   Discharged Condition: Improved  Hospital Course: Summer [name] is an 73 y.o. female who was admitted 05/19/2024 with a diagnosis of S/P TKR (total knee  replacement), left and went to the operating room on 05/19/2024 and underwent the above named procedures.    Surgeries: Procedure(s): ARTHROPLASTY, KNEE, TOTAL on 05/19/2024 Patient tolerated the surgery well. Taken to PACU where she was stabilized and then transferred to the orthopedic floor.  Started on Lovenox  30 mg q 12 hrs. TEDs and SCDs applied bilaterally. Heels elevated on bed. No evidence of DVT. Negative Homan. Physical therapy started on day #1 for gait training and transfer. OT started day #1 for ADL and assisted devices.  Patient's IV was d/c on day #1. Patient was able to safely and independently complete all PT goals. PT recommending discharge to home.    On post op day #1 patient was stable and ready for discharge to home wth.  Implants:  Femur: Persona Size 5 CR   Tibia: Persona Size D w/ 14x34mm stem extension  Poly: 10mm MC  Patella: 32x8.20mm symmetric   She was given perioperative antibiotics:  Anti-infectives (From admission, onward)    Start     Dose/Rate Route Frequency Ordered Stop   05/19/24 1330  ceFAZolin  (ANCEF ) IVPB 2g/100 mL premix        2 g 200 mL/hr over 30 Minutes Intravenous Every 6 hours 05/19/24 1055 05/20/24 0129   05/19/24 0630  ceFAZolin  (ANCEF ) IVPB 2g/100 mL premix        2 g 200 mL/hr over 30 Minutes Intravenous On call to O.R. 05/19/24 [id number] 05/19/24 0745     .  She was given sequential compression devices, early ambulation, and Lovenox  TEDs for DVT prophylaxis.  She benefited maximally from the hospital stay and there were no complications.    Recent vital signs:  Vitals:   05/19/24 1115 05/19/24 1143  BP: (!) 141/64 (!) 150/62  Pulse: 76   Resp: 13 13  Temp:  98.2 F (36.8 C)  SpO2: 94% 90%    Recent laboratory studies:  Lab Results  Component Value Date   HGB 13.2 05/06/2024   HGB 12.8 03/27/2024   HGB 13.1 10/23/2023   Lab Results  Component Value Date   WBC 9.9 05/06/2024   PLT 204 05/06/2024   Lab Results  Component  Value Date   INR 1.4 (H) 08/11/2022   Lab Results  Component Value Date   NA 141 05/06/2024   K 4.4 05/06/2024   CL 106 05/06/2024   CO2 26 05/06/2024   BUN 40 (H) 05/06/2024   CREATININE 1.45 (H) 05/06/2024   GLUCOSE 84 05/06/2024    Discharge Medications:   Allergies as of 05/19/2024       Reactions   Codeine Other (See Comments)   TRIPPED OUT  DIDN'T LIKE THE WAY IT FELT   Atorvastatin    muscle pain   Hydrocodone     itching   Tramadol     Latex Rash   Zolpidem  Other (See Comments)   Sleep walk        Medication List     STOP taking these medications  acetaminophen  650 MG CR tablet Commonly known as: Tylenol  8 Hour Replaced by: acetaminophen  500 MG tablet   ASPIRIN  LOW DOSE 81 MG tablet Generic drug: aspirin  EC       TAKE these medications    acetaminophen  500 MG tablet Commonly known as: TYLENOL  Take 2 tablets (1,000 mg total) by mouth every 8 (eight) hours. Replaces: acetaminophen  650 MG CR tablet   ALBUTEROL  IN Inhale into the lungs as needed.   amLODipine  5 MG tablet Commonly known as: NORVASC  Take 1 tablet (5 mg total) by mouth daily.   ARIPiprazole  2 MG tablet Commonly known as: ABILIFY  Take 1 tablet (2 mg total) by mouth every evening.   ascorbic acid  100 MG tablet Commonly known as: VITAMIN C  Take 500 mg by mouth 2 (two) times daily.   B-D ULTRAFINE III SHORT PEN 31G X 8 MM Misc Generic drug: Insulin  Pen Needle   buPROPion  150 MG 24 hr tablet Commonly known as: WELLBUTRIN  XL Take 1 tablet (150 mg total) by mouth daily.   chlorhexidine  4 % external liquid Commonly known as: HIBICLENS  Apply 15 mLs (1 Application total) topically as directed for 30 doses. Use as directed daily for 5 days every other week for 6 weeks.   CULTURELLE DIGESTIVE DAILY PO Take 15 Billion Cells by mouth daily.   cyanocobalamin  500 MCG tablet Commonly known as: VITAMIN B12 Take 500 mcg by mouth daily.   docusate sodium  100 MG capsule Commonly  known as: COLACE Take 1 capsule (100 mg total) by mouth 2 (two) times daily.   enoxaparin  40 MG/0.4ML injection Commonly known as: LOVENOX  Inject 0.4 mLs (40 mg total) into the skin daily for 14 days.   ezetimibe  10 MG tablet Commonly known as: ZETIA  Take 1 tablet (10 mg total) by mouth daily.   Farxiga  10 MG Tabs tablet Generic drug: dapagliflozin  propanediol Take 1 tablet (10 mg total) by mouth every morning.   FreeStyle Libre 2 Sensor Misc FOR GLUCOSE MONITORING   furosemide  20 MG tablet Commonly known as: LASIX  Take 1 tablet (20 mg total) by mouth daily.   metoprolol  tartrate 50 MG tablet Commonly known as: LOPRESSOR  Take 0.5 tablets (25 mg total) by mouth 2 (two) times daily.   mupirocin  ointment 2 % Commonly known as: BACTROBAN  Place 1 Application into the nose 2 (two) times daily for 60 doses. Use as directed 2 times daily for 5 days every other week for 6 weeks.   NovoLOG  FlexPen 100 UNIT/ML FlexPen Generic drug: insulin  aspart Inject 14 Units into the skin in the morning and at bedtime. What changed: when to take this   omeprazole  40 MG capsule Commonly known as: PRILOSEC Take 1 capsule (40 mg total) by mouth daily. What changed: when to take this   ondansetron  4 MG tablet Commonly known as: ZOFRAN  Take 1 tablet (4 mg total) by mouth every 6 (six) hours as needed for nausea.   OSTEO BI-FLEX ADV JOINT SHIELD PO Take by mouth 2 (two) times daily. What changed: when to take this   OSTEO BI-FLEX ONE PER DAY PO Take by mouth.   oxyCODONE  5 MG immediate release tablet Commonly known as: Oxy IR/ROXICODONE  Take 0.5-1 tablets (2.5-5 mg total) by mouth every 4 (four) hours as needed for moderate pain (pain score 4-6).   OZEMPIC  (1 MG/DOSE) Hayden Inject into the skin once a week. On thursday   pregabalin  150 MG capsule Commonly known as: LYRICA  Take 1 capsule (150 mg total) by mouth 3 (three) times  daily. What changed: when to take this   PROBIOTIC ADVANCED  PO Take 1 tablet by mouth daily.   psyllium 58.6 % powder Commonly known as: METAMUCIL Take 1 packet by mouth daily.   rosuvastatin  40 MG tablet Commonly known as: CRESTOR  Take 1 tablet (40 mg total) by mouth daily.   sertraline  100 MG tablet Commonly known as: ZOLOFT  Take 1.5 tablets (150 mg total) by mouth daily.   traZODone  100 MG tablet Commonly known as: DESYREL  Take 1 tablet (100 mg total) by mouth at bedtime as needed. for sleep What changed:  how much to take when to take this   Tresiba FlexTouch 200 UNIT/ML FlexTouch Pen Generic drug: insulin  degludec 85 Units daily. In morning   Vascepa  1 g capsule Generic drug: icosapent  Ethyl Take 2 capsules (2 g total) by mouth 2 (two) times daily.   Vitamin D3 25 MCG (1000 UT) Caps Take 1,000 Units by mouth daily. What changed:  how much to take when to take this        Diagnostic Studies: DG Knee Left Port Result Date: 05/19/2024 CLINICAL DATA:  Total knee replacement EXAM: PORTABLE LEFT KNEE - 1-2 VIEW COMPARISON:  05/01/2023 FINDINGS: Interval total knee prosthesis placement with resurfacing of the patella expected gas in the joint and soft tissues. No periprosthetic fracture or acute bony findings. IMPRESSION: 1. Interval total knee prosthesis placement without complicating feature. Electronically Signed   By: Ryan Salvage M.D.   On: 05/19/2024 10:51    Disposition:      Follow-up Information     Charlene Debby BROCKS, PA-C Follow up in 2 week(s).   Specialties: Orthopedic Surgery, Emergency Medicine Contact information: [address] [place] [address] [phone]                  Signed: Debby BROCKS Charlene 05/19/2024, 3:45 PM

## 2024-05-20 ENCOUNTER — Encounter: Payer: Self-pay | Admitting: Orthopedic Surgery

## 2024-05-20 DIAGNOSIS — M1712 Unilateral primary osteoarthritis, left knee: Secondary | ICD-10-CM | POA: Diagnosis not present

## 2024-05-20 LAB — CBC
HCT: 33.6 % — ABNORMAL LOW (ref 36.0–46.0)
Hemoglobin: 11.1 g/dL — ABNORMAL LOW (ref 12.0–15.0)
MCH: 30 pg (ref 26.0–34.0)
MCHC: 33 g/dL (ref 30.0–36.0)
MCV: 90.8 fL (ref 80.0–100.0)
Platelets: 173 K/uL (ref 150–400)
RBC: 3.7 MIL/uL — ABNORMAL LOW (ref 3.87–5.11)
RDW: 14 % (ref 11.5–15.5)
WBC: 12.3 K/uL — ABNORMAL HIGH (ref 4.0–10.5)
nRBC: 0 % (ref 0.0–0.2)

## 2024-05-20 LAB — BASIC METABOLIC PANEL WITH GFR
Anion gap: 11 (ref 5–15)
BUN: 37 mg/dL — ABNORMAL HIGH (ref 8–23)
CO2: 24 mmol/L (ref 22–32)
Calcium: 8.9 mg/dL (ref 8.9–10.3)
Chloride: 105 mmol/L (ref 98–111)
Creatinine, Ser: 1.42 mg/dL — ABNORMAL HIGH (ref 0.44–1.00)
GFR, Estimated: 39 mL/min — ABNORMAL LOW (ref >60)
Glucose, Bld: 202 mg/dL — ABNORMAL HIGH (ref 70–99)
Potassium: 4.6 mmol/L (ref 3.5–5.1)
Sodium: 140 mmol/L (ref 135–145)

## 2024-05-20 LAB — GLUCOSE, CAPILLARY
Glucose-Capillary: 161 mg/dL — ABNORMAL HIGH (ref 70–99)
Glucose-Capillary: 168 mg/dL — ABNORMAL HIGH (ref 70–99)

## 2024-05-20 MED ORDER — EZETIMIBE 10 MG PO TABS
ORAL_TABLET | ORAL | Status: AC
  Filled 2024-05-20: qty 1

## 2024-05-20 NOTE — Progress Notes (Signed)
 Physical Therapy Treatment Patient Details Name: Summer [name] MRN: [id number] DOB: 07/20/1951 Today's Date: 05/20/2024   History of Present Illness Pt is a 73 y.o. female s/p L TKA on 05/19/24.    PT Comments  Rates pain 7/10.  RN in to give pain meds but pt wanted to continue with session.  Participated in exercises as described below.  Supine and seated AROM.  She is able to get in/out of bed on her own with assist for polar care unit.  Education on use provided.  She stands and is able to complete x 1 lap on unit with RW and cga.  Some increased guarding and cues towards end of gait where pain and fatigue do affect gait quality.  Education for pain medication usage at home and general safety.  She stated she has a ramp at home and declined stair training.  Friend lives with her and will provide +1 assist upon discharge.  Encouraged +1 guarding especially on ramp and first few days.  Voiced understanding.  Pt opts to return to bed after session with needs met.     If plan is discharge home, recommend the following: A little help with bathing/dressing/bathroom;Assistance with cooking/housework;Assist for transportation;Help with stairs or ramp for entrance   Can travel by private vehicle        Equipment Recommendations  Rolling walker (2 wheels)    Recommendations for Other Services       Precautions / Restrictions Precautions Precautions: Knee Precaution Booklet Issued: Yes (comment) Restrictions Weight Bearing Restrictions Per Provider Order: Yes LLE Weight Bearing Per Provider Order: Weight bearing as tolerated     Mobility  Bed Mobility Overal bed mobility: Modified Independent               Patient Response: Cooperative  Transfers Overall transfer level: Needs assistance Equipment used: Rolling walker (2 wheels) Transfers: Sit to/from Stand Sit to Stand: Supervision           General transfer comment: cues for hand placement     Ambulation/Gait Ambulation/Gait assistance: Contact guard assist Gait Distance (Feet): 160 Feet Assistive device: Rolling walker (2 wheels) Gait Pattern/deviations: Step-through pattern, Decreased step length - right, Decreased step length - left       General Gait Details: walked lap on unit.  has ramp at home.  some decreased gait quality and increased speed as she fatigued.   Stairs             Wheelchair Mobility     Tilt Bed Tilt Bed Patient Response: Cooperative  Modified Rankin (Stroke Patients Only)       Balance Overall balance assessment: Needs assistance Sitting-balance support: Bilateral upper extremity supported, Feet supported Sitting balance-Leahy Scale: Good     Standing balance support: Bilateral upper extremity supported Standing balance-Leahy Scale: Fair                              Hotel manager: No apparent difficulties  Cognition Arousal: Alert Behavior During Therapy: WFL for tasks assessed/performed   PT - Cognitive impairments: No apparent impairments                         Following commands: Intact      Cueing Cueing Techniques: Verbal cues, Gestural cues, Tactile cues  Exercises Total Joint Exercises Goniometric ROM: 2-93 Other Exercises Other Exercises: supine and seated AROM for L knee.  General Comments        Pertinent Vitals/Pain Pain Assessment Pain Score: 7  Pain Location: L knee Pain Descriptors / Indicators: Sore, Aching Pain Intervention(s): Monitored during session, Limited activity within patient's tolerance, Ice applied, RN gave pain meds during session    Home Living                          Prior Function            PT Goals (current goals can now be found in the care plan section) Progress towards PT goals: Progressing toward goals    Frequency    BID      PT Plan      Co-evaluation              AM-PAC PT 6  Clicks Mobility   Outcome Measure  Help needed turning from your back to your side while in a flat bed without using bedrails?: None Help needed moving from lying on your back to sitting on the side of a flat bed without using bedrails?: None Help needed moving to and from a bed to a chair (including a wheelchair)?: A Little Help needed standing up from a chair using your arms (e.g., wheelchair or bedside chair)?: A Little Help needed to walk in hospital room?: A Little Help needed climbing 3-5 steps with a railing? : A Little 6 Click Score: 20    End of Session Equipment Utilized During Treatment: Gait belt Activity Tolerance: Patient tolerated treatment well Patient left: in bed;with call bell/phone within reach;with bed alarm set Nurse Communication: Mobility status PT Visit Diagnosis: Muscle weakness (generalized) (M62.81);Other abnormalities of gait and mobility (R26.89)     Time: 9171-9154 PT Time Calculation (min) (ACUTE ONLY): 17 min  Charges:    $Gait Training: 8-22 mins PT General Charges $$ ACUTE PT VISIT: 1 Visit                   Lauraine Gills, PTA 05/20/24, 8:59 AM

## 2024-05-20 NOTE — Progress Notes (Signed)
DISCHARGE NOTE:  Pt given discharge instructions and verbalized understanding. TED hose on both legs. Walker sent with pt. Pt wheeled to car by staff, family providing transportation.

## 2024-05-20 NOTE — Plan of Care (Signed)
  Problem: Pain Management: Goal: Pain level will decrease with appropriate interventions Outcome: Progressing   Problem: Activity: Goal: Ability to avoid complications of mobility impairment will improve Outcome: Progressing   Problem: Pain Management: Goal: Pain level will decrease with appropriate interventions Outcome: Progressing

## 2024-05-20 NOTE — Progress Notes (Signed)
 Subjective: 1 Day Post-Op Procedure(s) (LRB): ARTHROPLASTY, KNEE, TOTAL (Left) Patient reports pain as mild.   Patient is well, and has had no acute complaints or problems Denies any CP, SOB, ABD pain. We will continue therapy today.  Plan is to go Home after hospital stay.  Objective: Vital signs in last 24 hours: Temp:  [96.9 F (36.1 C)-98.9 F (37.2 C)] 97.7 F (36.5 C) (09/09 0742) Pulse Rate:  [76-91] 79 (09/09 0742) Resp:  [13-31] 17 (09/09 0742) BP: (112-157)/(50-65) 133/65 (09/09 0742) SpO2:  [90 %-97 %] 95 % (09/09 0742)  Intake/Output from previous day: 09/08 0701 - 09/09 0700 In: 2199 [P.O.:480; I.V.:1421.5; IV Piggyback:297.5] Out: 475 [Urine:400; Blood:75] Intake/Output this shift: No intake/output data recorded.  Recent Labs    05/20/24 0619  HGB 11.1*   Recent Labs    05/20/24 0619  WBC 12.3*  RBC 3.70*  HCT 33.6*  PLT 173   Recent Labs    05/20/24 0619  NA 140  K 4.6  CL 105  CO2 24  BUN 37*  CREATININE 1.42*  GLUCOSE 202*  CALCIUM  8.9   No results for input(s): LABPT, INR in the last 72 hours.  EXAM General - Patient is Alert, Appropriate, and Oriented Extremity - Neurovascular intact Sensation intact distally Intact pulses distally Dorsiflexion/Plantar flexion intact No cellulitis present Compartment soft Dressing - dressing C/D/I and no drainage Motor Function - intact, moving foot and toes well on exam.   Past Medical History:  Diagnosis Date   (HFpEF) heart failure with preserved ejection fraction ([name])    Anemia    Aortic atherosclerosis ([name])    Arthritis    Asthma    B12 deficiency    Back pain    CAD (coronary artery disease)    Carpal tunnel syndrome    Cerebral microvascular disease    Charcot foot due to diabetes mellitus ([name])    CKD (chronic kidney disease), stage IV ([name])    Complication of anesthesia    a.) early emergence   Constipation    COPD (chronic obstructive pulmonary disease) ([name])     Depression    Diabetic peripheral neuropathy ([name])    Dyspnea    GERD (gastroesophageal reflux disease)    Headache    Hiatal hernia    History of kidney stones    Hyperlipidemia    Hypertension    Hypothyroidism    IBS (irritable bowel syndrome)    Insomnia    a.) takes trazodone  PRN   Joint pain    Long-term use of aspirin  therapy    Memory loss    Obesity    OSA (obstructive sleep apnea)    a.) does not utilize nocturnal PAP therapy   PAD (peripheral artery disease) ([name])    Pneumonia    Posterior reversible encephalopathy syndrome    Rhinitis, allergic    Rosacea    SOB (shortness of breath)    T2DM (type 2 diabetes mellitus) ([name])    Unspecified hearing loss    Vitamin D  deficiency     Assessment/Plan:   1 Day Post-Op Procedure(s) (LRB): ARTHROPLASTY, KNEE, TOTAL (Left) Principal Problem:   S/P TKR (total knee replacement), left Active Problems:   Long-term use of aspirin  therapy  Estimated body mass index is 42.07 kg/m as calculated from the following:   Height as of this encounter: 5' 2 (1.575 m).   Weight as of this encounter: 104.3 kg. Advance diet Up with therapy Pain well controlled Labs and VSS  CM to assist with discharge to home with HHPT today  DVT Prophylaxis - Lovenox , TED hose, and SCDS Weight-Bearing as tolerated to left leg   T. Medford Amber, PA-C [facility] 05/20/2024, 7:55 AM

## 2024-05-20 NOTE — Plan of Care (Signed)
   Problem: Activity: Goal: Ability to avoid complications of mobility impairment will improve Outcome: Progressing   Problem: Pain Management: Goal: Pain level will decrease with appropriate interventions Outcome: Progressing

## 2024-05-22 ENCOUNTER — Observation Stay
Admission: EM | Admit: 2024-05-22 | Discharge: 2024-05-31 | Disposition: A | Discharge status: Home Health | Attending: Family Medicine | Admitting: Family Medicine

## 2024-05-22 ENCOUNTER — Other Ambulatory Visit: Payer: Self-pay

## 2024-05-22 DIAGNOSIS — I130 Hypertensive heart and chronic kidney disease with heart failure and stage 1 through stage 4 chronic kidney disease, or unspecified chronic kidney disease: Secondary | ICD-10-CM | POA: Diagnosis not present

## 2024-05-22 DIAGNOSIS — N1831 Chronic kidney disease, stage 3a: Secondary | ICD-10-CM | POA: Diagnosis not present

## 2024-05-22 DIAGNOSIS — K5289 Other specified noninfective gastroenteritis and colitis: Secondary | ICD-10-CM | POA: Diagnosis not present

## 2024-05-22 DIAGNOSIS — E785 Hyperlipidemia, unspecified: Secondary | ICD-10-CM

## 2024-05-22 DIAGNOSIS — K6289 Other specified diseases of anus and rectum: Secondary | ICD-10-CM | POA: Diagnosis not present

## 2024-05-22 DIAGNOSIS — A419 Sepsis, unspecified organism: Secondary | ICD-10-CM

## 2024-05-22 DIAGNOSIS — N390 Urinary tract infection, site not specified: Secondary | ICD-10-CM | POA: Diagnosis not present

## 2024-05-22 DIAGNOSIS — K219 Gastro-esophageal reflux disease without esophagitis: Secondary | ICD-10-CM | POA: Diagnosis not present

## 2024-05-22 DIAGNOSIS — I10 Essential (primary) hypertension: Secondary | ICD-10-CM | POA: Diagnosis present

## 2024-05-22 DIAGNOSIS — F329 Major depressive disorder, single episode, unspecified: Secondary | ICD-10-CM | POA: Diagnosis not present

## 2024-05-22 DIAGNOSIS — Z96652 Presence of left artificial knee joint: Secondary | ICD-10-CM | POA: Diagnosis not present

## 2024-05-22 DIAGNOSIS — D72829 Elevated white blood cell count, unspecified: Secondary | ICD-10-CM | POA: Insufficient documentation

## 2024-05-22 DIAGNOSIS — Z79899 Other long term (current) drug therapy: Secondary | ICD-10-CM | POA: Diagnosis not present

## 2024-05-22 DIAGNOSIS — Z794 Long term (current) use of insulin: Secondary | ICD-10-CM | POA: Insufficient documentation

## 2024-05-22 DIAGNOSIS — R410 Disorientation, unspecified: Secondary | ICD-10-CM | POA: Insufficient documentation

## 2024-05-22 DIAGNOSIS — E1165 Type 2 diabetes mellitus with hyperglycemia: Secondary | ICD-10-CM

## 2024-05-22 DIAGNOSIS — I5030 Unspecified diastolic (congestive) heart failure: Secondary | ICD-10-CM | POA: Diagnosis present

## 2024-05-22 DIAGNOSIS — Z7982 Long term (current) use of aspirin: Secondary | ICD-10-CM | POA: Diagnosis not present

## 2024-05-22 DIAGNOSIS — R109 Unspecified abdominal pain: Secondary | ICD-10-CM | POA: Diagnosis present

## 2024-05-22 DIAGNOSIS — I5032 Chronic diastolic (congestive) heart failure: Secondary | ICD-10-CM | POA: Diagnosis not present

## 2024-05-22 DIAGNOSIS — A4150 Gram-negative sepsis, unspecified: Secondary | ICD-10-CM | POA: Diagnosis not present

## 2024-05-22 LAB — COMPREHENSIVE METABOLIC PANEL WITH GFR
ALT: 12 U/L (ref 0–44)
AST: 25 U/L (ref 15–41)
Albumin: 3.8 g/dL (ref 3.5–5.0)
Alkaline Phosphatase: 63 U/L (ref 38–126)
Anion gap: 19 — ABNORMAL HIGH (ref 5–15)
BUN: 24 mg/dL — ABNORMAL HIGH (ref 8–23)
CO2: 18 mmol/L — ABNORMAL LOW (ref 22–32)
Calcium: 9.9 mg/dL (ref 8.9–10.3)
Chloride: 100 mmol/L (ref 98–111)
Creatinine, Ser: 1.5 mg/dL — ABNORMAL HIGH (ref 0.44–1.00)
GFR, Estimated: 36 mL/min — ABNORMAL LOW (ref >60)
Glucose, Bld: 271 mg/dL — ABNORMAL HIGH (ref 70–99)
Potassium: 4.2 mmol/L (ref 3.5–5.1)
Sodium: 137 mmol/L (ref 135–145)
Total Bilirubin: 1.5 mg/dL — ABNORMAL HIGH (ref 0.0–1.2)
Total Protein: 7.6 g/dL (ref 6.5–8.1)

## 2024-05-22 LAB — URINALYSIS, ROUTINE W REFLEX MICROSCOPIC
Bilirubin Urine: NEGATIVE
Glucose, UA: NEGATIVE mg/dL
Hgb urine dipstick: NEGATIVE
Ketones, ur: NEGATIVE mg/dL
Nitrite: NEGATIVE
Protein, ur: >=300 mg/dL — AB
Specific Gravity, Urine: 1.017 (ref 1.005–1.030)
WBC, UA: >50 WBC/hpf (ref 0–5)
pH: 5 (ref 5.0–8.0)

## 2024-05-22 LAB — CBG MONITORING, ED
Glucose-Capillary: 267 mg/dL — ABNORMAL HIGH (ref 70–99)
Glucose-Capillary: 201 mg/dL — ABNORMAL HIGH (ref 70–99)

## 2024-05-22 LAB — LIPASE, BLOOD: Lipase: 22 U/L (ref 11–51)

## 2024-05-22 LAB — RESP PANEL BY RT-PCR (RSV, FLU A&B, COVID)  RVPGX2
Influenza A by PCR: NEGATIVE
Influenza B by PCR: NEGATIVE
Resp Syncytial Virus by PCR: NEGATIVE
SARS Coronavirus 2 by RT PCR: NEGATIVE

## 2024-05-22 LAB — BLOOD GAS, VENOUS
Acid-base deficit: 1.7 mmol/L (ref 0.0–2.0)
Bicarbonate: 23 mmol/L (ref 20.0–28.0)
O2 Saturation: 71.4 %
Patient temperature: 37
pCO2, Ven: 38 mmHg — ABNORMAL LOW (ref 44–60)
pH, Ven: 7.39 (ref 7.25–7.43)
pO2, Ven: 40 mmHg (ref 32–45)

## 2024-05-22 LAB — CBC
HCT: 37.8 % (ref 36.0–46.0)
Hemoglobin: 12.4 g/dL (ref 12.0–15.0)
MCH: 29.4 pg (ref 26.0–34.0)
MCHC: 32.8 g/dL (ref 30.0–36.0)
MCV: 89.6 fL (ref 80.0–100.0)
Platelets: 251 K/uL (ref 150–400)
RBC: 4.22 MIL/uL (ref 3.87–5.11)
RDW: 14 % (ref 11.5–15.5)
WBC: 15.7 K/uL — ABNORMAL HIGH (ref 4.0–10.5)
nRBC: 0 % (ref 0.0–0.2)

## 2024-05-22 LAB — LACTIC ACID, PLASMA: Lactic Acid, Venous: 1.6 mmol/L (ref 0.5–1.9)

## 2024-05-22 MED ORDER — ASPIRIN 81 MG PO TBEC
81.0000 mg | DELAYED_RELEASE_TABLET | Freq: Every day | ORAL | Status: DC
  Administered 2024-05-23 – 2024-05-31 (×9): 81 mg via ORAL
  Filled 2024-05-22 (×9): qty 1

## 2024-05-22 MED ORDER — AMLODIPINE BESYLATE 5 MG PO TABS
5.0000 mg | ORAL_TABLET | Freq: Every day | ORAL | Status: DC
Start: 2024-05-23 — End: 2024-05-26
  Administered 2024-05-23 – 2024-05-26 (×4): 5 mg via ORAL
  Filled 2024-05-22 (×4): qty 1

## 2024-05-22 MED ORDER — EZETIMIBE 10 MG PO TABS
10.0000 mg | ORAL_TABLET | Freq: Every day | ORAL | Status: DC
Start: 2024-05-23 — End: 2024-05-31
  Administered 2024-05-23 – 2024-05-31 (×9): 10 mg via ORAL
  Filled 2024-05-22 (×9): qty 1

## 2024-05-22 MED ORDER — INSULIN GLARGINE 100 UNIT/ML ~~LOC~~ SOLN
85.0000 [IU] | SUBCUTANEOUS | Status: DC
  Administered 2024-05-22: 85 [IU] via SUBCUTANEOUS
  Filled 2024-05-22: qty 0.85

## 2024-05-22 MED ORDER — LACTATED RINGERS IV SOLN
150.0000 mL/h | INTRAVENOUS | Status: AC
  Administered 2024-05-22 – 2024-05-23 (×2): 150 mL/h via INTRAVENOUS

## 2024-05-22 MED ORDER — ROSUVASTATIN CALCIUM 10 MG PO TABS
40.0000 mg | ORAL_TABLET | Freq: Every day | ORAL | Status: DC
Start: 2024-05-23 — End: 2024-05-31
  Administered 2024-05-23 – 2024-05-31 (×9): 40 mg via ORAL
  Filled 2024-05-22 (×5): qty 4
  Filled 2024-05-22: qty 2
  Filled 2024-05-22 (×3): qty 4

## 2024-05-22 MED ORDER — ENOXAPARIN SODIUM 60 MG/0.6ML IJ SOSY
0.5000 mg/kg | PREFILLED_SYRINGE | INTRAMUSCULAR | Status: DC
  Administered 2024-05-22 – 2024-05-30 (×9): 52.5 mg via SUBCUTANEOUS
  Filled 2024-05-22 (×9): qty 0.6

## 2024-05-22 MED ORDER — OXYCODONE HCL 5 MG PO TABS
2.5000 mg | ORAL_TABLET | ORAL | Status: DC | PRN
Start: 2024-05-22 — End: 2024-05-30
  Administered 2024-05-23 – 2024-05-30 (×12): 5 mg via ORAL
  Filled 2024-05-22 (×12): qty 1

## 2024-05-22 MED ORDER — VITAMIN D 25 MCG (1000 UNIT) PO TABS
2000.0000 [IU] | ORAL_TABLET | Freq: Two times a day (BID) | ORAL | Status: DC
  Administered 2024-05-22 – 2024-05-31 (×18): 2000 [IU] via ORAL
  Filled 2024-05-22 (×18): qty 2

## 2024-05-22 MED ORDER — PREGABALIN 75 MG PO CAPS
150.0000 mg | ORAL_CAPSULE | Freq: Two times a day (BID) | ORAL | Status: DC
Start: 2024-05-23 — End: 2024-05-31
  Administered 2024-05-23 – 2024-05-31 (×17): 150 mg via ORAL
  Filled 2024-05-22 (×17): qty 2

## 2024-05-22 MED ORDER — SODIUM CHLORIDE 0.9 % IV BOLUS
1000.0000 mL | Freq: Once | INTRAVENOUS | Status: AC
  Administered 2024-05-22: 1000 mL via INTRAVENOUS

## 2024-05-22 MED ORDER — ACETAMINOPHEN 325 MG PO TABS
650.0000 mg | ORAL_TABLET | Freq: Four times a day (QID) | ORAL | Status: DC | PRN
  Administered 2024-05-23 – 2024-05-31 (×3): 650 mg via ORAL
  Filled 2024-05-22 (×3): qty 2

## 2024-05-22 MED ORDER — ACETAMINOPHEN 650 MG RE SUPP: 650.0000 mg | Freq: Four times a day (QID) | RECTAL | Status: DC | PRN

## 2024-05-22 MED ORDER — METOPROLOL TARTRATE 25 MG PO TABS
25.0000 mg | ORAL_TABLET | Freq: Two times a day (BID) | ORAL | Status: DC
Start: 2024-05-22 — End: 2024-05-26
  Administered 2024-05-22 – 2024-05-26 (×8): 25 mg via ORAL
  Filled 2024-05-22 (×8): qty 1

## 2024-05-22 MED ORDER — SERTRALINE HCL 50 MG PO TABS
150.0000 mg | ORAL_TABLET | Freq: Every day | ORAL | Status: DC
  Administered 2024-05-23 – 2024-05-31 (×9): 150 mg via ORAL
  Filled 2024-05-22 (×9): qty 3

## 2024-05-22 MED ORDER — FUROSEMIDE 40 MG PO TABS
20.0000 mg | ORAL_TABLET | Freq: Every day | ORAL | Status: DC
Start: 2024-05-23 — End: 2024-05-24
  Administered 2024-05-23 – 2024-05-24 (×2): 20 mg via ORAL
  Filled 2024-05-22 (×2): qty 1

## 2024-05-22 MED ORDER — VITAMIN C 500 MG PO TABS
500.0000 mg | ORAL_TABLET | Freq: Two times a day (BID) | ORAL | Status: DC
  Administered 2024-05-22 – 2024-05-31 (×18): 500 mg via ORAL
  Filled 2024-05-22 (×18): qty 1

## 2024-05-22 MED ORDER — SODIUM CHLORIDE 0.9 % IV SOLN
1.0000 g | Freq: Once | INTRAVENOUS | Status: AC
  Administered 2024-05-22: 1 g via INTRAVENOUS
  Filled 2024-05-22: qty 10

## 2024-05-22 MED ORDER — ICOSAPENT ETHYL 1 G PO CAPS
2.0000 g | ORAL_CAPSULE | Freq: Two times a day (BID) | ORAL | Status: DC
  Administered 2024-05-23 – 2024-05-31 (×17): 2 g via ORAL
  Filled 2024-05-22 (×18): qty 2

## 2024-05-22 MED ORDER — MAGNESIUM HYDROXIDE 400 MG/5ML PO SUSP: 30.0000 mL | Freq: Every day | ORAL | Status: DC | PRN

## 2024-05-22 MED ORDER — DAPAGLIFLOZIN PROPANEDIOL 10 MG PO TABS
10.0000 mg | ORAL_TABLET | Freq: Every morning | ORAL | Status: DC
Start: 2024-05-23 — End: 2024-05-28
  Administered 2024-05-23 – 2024-05-27 (×5): 10 mg via ORAL
  Filled 2024-05-22 (×6): qty 1

## 2024-05-22 MED ORDER — TRAZODONE HCL 50 MG PO TABS: 25.0000 mg | ORAL_TABLET | Freq: Every evening | ORAL | Status: DC | PRN

## 2024-05-22 MED ORDER — INSULIN ASPART 100 UNIT/ML IJ SOLN
8.0000 [IU] | Freq: Once | INTRAMUSCULAR | Status: AC
  Administered 2024-05-22: 8 [IU] via INTRAVENOUS
  Filled 2024-05-22: qty 8

## 2024-05-22 MED ORDER — ONDANSETRON HCL 4 MG PO TABS
4.0000 mg | ORAL_TABLET | Freq: Four times a day (QID) | ORAL | Status: DC | PRN
  Administered 2024-05-27: 4 mg via ORAL
  Filled 2024-05-22: qty 1

## 2024-05-22 MED ORDER — SODIUM CHLORIDE 0.9 % IV SOLN
2.0000 g | INTRAVENOUS | Status: DC
  Administered 2024-05-23: 2 g via INTRAVENOUS
  Filled 2024-05-22 (×3): qty 20

## 2024-05-22 MED ORDER — ARIPIPRAZOLE 2 MG PO TABS
2.0000 mg | ORAL_TABLET | Freq: Every evening | ORAL | Status: DC
  Administered 2024-05-23 – 2024-05-30 (×8): 2 mg via ORAL
  Filled 2024-05-22 (×11): qty 1

## 2024-05-22 MED ORDER — BUPROPION HCL ER (XL) 150 MG PO TB24
150.0000 mg | ORAL_TABLET | Freq: Every day | ORAL | Status: DC
Start: 2024-05-23 — End: 2024-05-31
  Administered 2024-05-23 – 2024-05-31 (×9): 150 mg via ORAL
  Filled 2024-05-22 (×9): qty 1

## 2024-05-22 MED ORDER — SODIUM CHLORIDE 0.9 % IV SOLN: 1.0000 g | Freq: Once | INTRAVENOUS | Status: DC

## 2024-05-22 MED ORDER — DOCUSATE SODIUM 100 MG PO CAPS
100.0000 mg | ORAL_CAPSULE | Freq: Two times a day (BID) | ORAL | Status: DC
Start: 2024-05-22 — End: 2024-05-27
  Administered 2024-05-22 – 2024-05-27 (×8): 100 mg via ORAL
  Filled 2024-05-22 (×8): qty 1

## 2024-05-22 MED ORDER — MUPIROCIN 2 % EX OINT: 1.0000 | TOPICAL_OINTMENT | Freq: Two times a day (BID) | CUTANEOUS | Status: DC

## 2024-05-22 MED ORDER — ONDANSETRON HCL 4 MG/2ML IJ SOLN
4.0000 mg | Freq: Four times a day (QID) | INTRAMUSCULAR | Status: DC | PRN
  Administered 2024-05-23: 4 mg via INTRAVENOUS
  Filled 2024-05-22: qty 2

## 2024-05-22 MED ORDER — TRAZODONE HCL 50 MG PO TABS
150.0000 mg | ORAL_TABLET | Freq: Every day | ORAL | Status: DC
  Administered 2024-05-22 – 2024-05-30 (×9): 150 mg via ORAL
  Filled 2024-05-22 (×10): qty 1

## 2024-05-22 NOTE — Progress Notes (Signed)
 Anticoagulation monitoring(Lovenox ):  73 yo female ordered Lovenox  40 mg Q24h    There were no vitals filed for this visit. BMI 42.1    Lab Results  Component Value Date   CREATININE 1.50 (H) 05/22/2024   CREATININE 1.42 (H) 05/20/2024   CREATININE 1.45 (H) 05/06/2024   Estimated Creatinine Clearance: 36.7 mL/min (A) (by C-G formula based on SCr of 1.5 mg/dL (H)). Hemoglobin & Hematocrit     Component Value Date/Time   HGB 12.4 05/22/2024 1549   HGB 11.1 05/13/2020 1146   HCT 37.8 05/22/2024 1549   HCT 33.7 (L) 05/13/2020 1146     Per Protocol for Patient with estCrcl > 30 ml/min and BMI > 30, will transition to Lovenox  52.5 mg Q24h.

## 2024-05-22 NOTE — Assessment & Plan Note (Signed)
-   Will continue Wellbutrin  XL and Zoloft  as well as Abilify .

## 2024-05-22 NOTE — Assessment & Plan Note (Signed)
-   Will continue antihypertensive therapy.

## 2024-05-22 NOTE — ED Notes (Signed)
 Reviewed pt's chart and results; acuity level changed and will take pt to next available bed

## 2024-05-22 NOTE — Assessment & Plan Note (Signed)
 Will continue Lasix  and Farxiga  as well as beta-blocker therapy.

## 2024-05-22 NOTE — Assessment & Plan Note (Signed)
-   The patient will be placed on supplemental coverage with NovoLog . - Will continue basal coverage. - Will continue Lyrica  for diabetic neuropathy.

## 2024-05-22 NOTE — ED Provider Notes (Signed)
 [facility] Provider Note    Event Date/Time   First MD Initiated Contact with Patient 05/22/24 2009     (approximate)  History   Chief Complaint: Abdominal Pain and Knee Pain  HPI  Summer [name] is a 73 y.o. female with a past medical history of CHF, anemia, CAD, COPD, hypertension, hyperlipidemia, diabetes, presents to the emergency department for confusion and weakness.  According to the patient and family member she had a left knee surgery performed 05/19/2024 for a left total knee replacement.  Patient has been taking pain medication.  Family member states the patient has been very confused over the last 2 days or so.  Patient states some nausea.  Has been checking her blood sugars but she cannot tell me what they were and the patient seems confused asked her family member to answer most all of the questions for her.  Dry mucous membranes.  States some lower abdominal discomfort at times as well as some mild urinary discomfort.  Physical Exam   Triage Vital Signs: ED Triage Vitals [05/22/24 1550]  Encounter Vitals Group     BP (!) 149/86     Girls Systolic BP Percentile      Girls Diastolic BP Percentile      Boys Systolic BP Percentile      Boys Diastolic BP Percentile      Pulse Rate (!) 127     Resp 18     Temp 98.1 F (36.7 C)     Temp Source Oral     SpO2 95 %     Weight      Height      Head Circumference      Peak Flow      Pain Score 9     Pain Loc      Pain Education      Exclude from Growth Chart     Most recent vital signs: Vitals:   05/22/24 1550 05/22/24 1816  BP: (!) 149/86 (!) 139/109  Pulse: (!) 127 (!) 120  Resp: 18 18  Temp: 98.1 F (36.7 C) 98 F (36.7 C)  SpO2: 95% 100%    General: Awake, no distress.  CV:  Good peripheral perfusion.  Regular rate and rhythm  Resp:  Normal effort.  Equal breath sounds bilaterally.  Abd:  No distention.  Soft, slight suprapubic tenderness otherwise benign abdomen.  No right lower  quadrant tenderness no rebound or guarding.  ED Results / Procedures / Treatments   MEDICATIONS ORDERED IN ED: Medications  cefTRIAXone  (ROCEPHIN ) 1 g in sodium chloride  0.9 % 100 mL IVPB (1 g Intravenous New Bag/Given 05/22/24 2021)  sodium chloride  0.9 % bolus 1,000 mL (1,000 mLs Intravenous New Bag/Given 05/22/24 2022)     IMPRESSION / MDM / ASSESSMENT AND PLAN / ED COURSE  I reviewed the triage vital signs and the nursing notes.  Patient's presentation is most consistent with acute presentation with potential threat to life or bodily function.  Patient presents emergency department for nausea confusion lower abdominal discomfort and dysuria.  Patient had a left total knee replacement done 3 days ago.  Afebrile.  Somewhat tachycardic with dry mucous membranes.  Will begin IV hydration.  Patient's lab work today shows a leukocytosis of 15,000, chemistry shows mild renal insufficiency with a glucose of 271 and a bicarb of 18 and a gap of 19 concerning for possible diabetic ketoacidosis.  LFTs and lipase are normal.  VBG has been sent.  Patient's urinalysis has resulted showing significant white blood cells and bacteria consistent with a urinary tract infection.  Will start the patient on IV Rocephin  while awaiting VBG results.  VBG shows a pH of 7.39 does not appear consistent with DKA however given the patient's elevated anion gap we will dose IV insulin  will continue with IV hydration.  Given the patient's white blood cell count with tachycardia and significant urinary tract infection meeting sepsis criteria we will start the patient on IV Rocephin  for her urinary tract infection.  Cultures have been sent.  Will admit to the hospital service for further workup and treatment.  CRITICAL CARE Performed by: Franky Moores   Total critical care time: 30 minutes  Critical care time was exclusive of separately billable procedures and treating other patients.  Critical care was necessary to  treat or prevent imminent or life-threatening deterioration.  Critical care was time spent personally by me on the following activities: development of treatment plan with patient and/or surrogate as well as nursing, discussions with consultants, evaluation of patient's response to treatment, examination of patient, obtaining history from patient or surrogate, ordering and performing treatments and interventions, ordering and review of laboratory studies, ordering and review of radiographic studies, pulse oximetry and re-evaluation of patient's condition.   FINAL CLINICAL IMPRESSION(S) / ED DIAGNOSES   Urinary tract infection Hyperglycemia Sepsis Confusion  Note:  This document was prepared using Dragon voice recognition software and may include unintentional dictation errors.   Moores Franky, MD 05/22/24 2114

## 2024-05-22 NOTE — Assessment & Plan Note (Signed)
-   Will continue PPI therapy.

## 2024-05-22 NOTE — Assessment & Plan Note (Signed)
 Will continue statin therapy and Zetia.

## 2024-05-22 NOTE — Assessment & Plan Note (Addendum)
-   The patient be admitted to a medical telemetry bed. - Will continue antibiotic therapy with IV Rocephin . - Will follow blood and urine cultures. - The patient will be hydrated with IV lactated ringer . - Her sepsis is manifested by tachycardia, tachypnea and leukocytosis.

## 2024-05-22 NOTE — Sepsis Progress Note (Signed)
 Following for sepsis monitoring ?

## 2024-05-22 NOTE — ED Triage Notes (Signed)
 Pt to ED via POV from home. Pt reports decreased appetite and fatigue since yesterday. Pt also reports mid/lower abd pain. Pt also reports left knee surgery on 9/8. Pt states unable to have BM since being on oxycodone  from surgery.

## 2024-05-22 NOTE — H&P (Signed)
 [place]   PATIENT NAME: Summer [name]    MR#:  [id number]  DATE OF BIRTH:  1950-12-20  DATE OF ADMISSION:  05/22/2024  PRIMARY CARE PHYSICIAN: Sowles, Krichna, MD   Patient is coming from: Home  REQUESTING/REFERRING PHYSICIAN: Dorothyann Drivers, MD  CHIEF COMPLAINT:   Chief Complaint  Patient presents with  . Abdominal Pain  . Knee Pain    HISTORY OF PRESENT ILLNESS:  Summer [name] is a 73 y.o. Caucasian female with medical history significant for HFpEF, asthma, COPD, coronary artery disease, stage IV CKD, type 2 diabetes mellitus and PAD, depression and GERD, who presented to the emergency room with acute onset of altered mental status with confusion.  She admits to urinary frequency, urgency, dysuria without hematuria or flank pain.  She has been having chills but did not report any measured fever.  She had a left total knee arthroplasty on 05/19/2024 and has been taking pain medications since.  She has been very confused over the last couple of days.  She admitted to nausea without vomiting.  She admitted to mild lower abdominal discomfort.  No chest pain or palpitations.  No cough or wheezing or dyspnea.  ED Course: When the patient came to the ER, BP was 139/109 and heart rate was 120 with otherwise normal vital signs.  Labs revealed hyperglycemia of 271 and a CO2 of 18 with a anion gap of 19 however VBG showed HCO3 of 23 and pH of 7.39.  Total bili was 1.5 and LFTs were otherwise normal.  Lactic acid was 1.6.  CBC showed leukocytosis of 15.7.  UA came back positive for UTI.  Urine culture was sent as well as 2 blood cultures. EKG as reviewed by me : None. Imaging: None.  The patient was given 2 L bolus of IV normal saline and 8 units of IV NovoLog , 2 g of IV Rocephin  and will be admitted to a medical telemetry bed for further evaluation and management. PAST MEDICAL HISTORY:   Past Medical History:  Diagnosis Date  . (HFpEF) heart failure with preserved ejection fraction  ([name])   . Anemia   . Aortic atherosclerosis ([name])   . Arthritis   . Asthma   . B12 deficiency   . Back pain   . CAD (coronary artery disease)   . Carpal tunnel syndrome   . Cerebral microvascular disease   . Charcot foot due to diabetes mellitus ([name])   . CKD (chronic kidney disease), stage IV ([name])   . Complication of anesthesia    a.) early emergence  . Constipation   . COPD (chronic obstructive pulmonary disease) ([name])   . Depression   . Diabetic peripheral neuropathy ([name])   . Dyspnea   . GERD (gastroesophageal reflux disease)   . Headache   . Hiatal hernia   . History of kidney stones   . Hyperlipidemia   . Hypertension   . Hypothyroidism   . IBS (irritable bowel syndrome)   . Insomnia    a.) takes trazodone  PRN  . Joint pain   . Long-term use of aspirin  therapy   . Memory loss   . Obesity   . OSA (obstructive sleep apnea)    a.) does not utilize nocturnal PAP therapy  . PAD (peripheral artery disease) ([name])   . Pneumonia   . Posterior reversible encephalopathy syndrome   . Rhinitis, allergic   . Rosacea   . SOB (shortness of breath)   . T2DM (type  2 diabetes mellitus) ([name])   . Unspecified hearing loss   . Vitamin D  deficiency     PAST SURGICAL HISTORY:   Past Surgical History:  Procedure Laterality Date  . ABDOMINAL HYSTERECTOMY  1975  . AMPUTATION TOE Left 05/02/2023   Procedure: LEFT FOOT PARTIAL AMPUTATION 1st & 2nd TOE;  Surgeon: Silva Juliene SAUNDERS, DPM;  Location: [facility] ORS;  Service: Orthopedics/Podiatry;  Laterality: Left;  . ANKLE SURGERY Left approx Seriyah Collison 2018  . APPENDECTOMY  1970  . CATARACT EXTRACTION Bilateral 01/2011   right  . COLONOSCOPY WITH PROPOFOL  N/A 08/19/2019   Procedure: COLONOSCOPY WITH PROPOFOL ;  Surgeon: Therisa Bi, MD;  Location: [facility] ENDOSCOPY;  Service: Gastroenterology;  Laterality: N/A;  . eye lid surgery  2013   bilateral  . FOOT SURGERY    . LOWER EXTREMITY ANGIOGRAPHY Left 11/23/2020   Procedure: LOWER EXTREMITY  ANGIOGRAPHY;  Surgeon: Jama Cordella MATSU, MD;  Location: [facility] INVASIVE CV LAB;  Service: Cardiovascular;  Laterality: Left;  . NECK SURGERY    . SPINE SURGERY    . TEE WITHOUT CARDIOVERSION N/A 08/16/2022   Procedure: TRANSESOPHAGEAL ECHOCARDIOGRAM (TEE);  Surgeon: Darliss Rogue, MD;  Location: [facility] ORS;  Service: Cardiovascular;  Laterality: N/A;  . TOTAL KNEE ARTHROPLASTY Left 05/19/2024   Procedure: ARTHROPLASTY, KNEE, TOTAL;  Surgeon: Lorelle Hussar, MD;  Location: [facility] ORS;  Service: Orthopedics;  Laterality: Left;  . TUBAL LIGATION    . VAGINAL HYSTERECTOMY  1989    SOCIAL HISTORY:   Social History   Tobacco Use  . Smoking status: Never  . Smokeless tobacco: Never  Substance Use Topics  . Alcohol use: No    Alcohol/week: 0.0 standard drinks of alcohol    FAMILY HISTORY:   Family History  Problem Relation Age of Onset  . Aneurysm Mother   . Aortic aneurysm Mother   . Hypertension Mother   . Hyperlipidemia Mother   . Heart disease Mother   . Obesity Mother   . Heart attack Maternal Grandfather   . Diabetes Maternal Grandfather     DRUG ALLERGIES:   Allergies  Allergen Reactions  . Codeine Other (See Comments)    TRIPPED OUT  DIDN'T LIKE THE WAY IT FELT  . Atorvastatin     muscle pain  . Hydrocodone      itching  . Tramadol    . Latex Rash  . Zolpidem  Other (See Comments)    Sleep walk    REVIEW OF SYSTEMS:   ROS As per history of present illness. All pertinent systems were reviewed above. Constitutional, HEENT, cardiovascular, respiratory, GI, GU, musculoskeletal, neuro, psychiatric, endocrine, integumentary and hematologic systems were reviewed and are otherwise negative/unremarkable except for positive findings mentioned above in the HPI.   MEDICATIONS AT HOME:   Prior to Admission medications   Medication Sig Start Date End Date Taking? Authorizing Provider  acetaminophen  (TYLENOL ) 500 MG tablet Take 2 tablets (1,000 mg total) by mouth every  8 (eight) hours. 05/19/24   Charlene Debby BROCKS, PA-C  ALBUTEROL  IN Inhale into the lungs as needed.    [provider]  amLODipine  (NORVASC ) 5 MG tablet Take 1 tablet (5 mg total) by mouth daily. 04/04/24   Sowles, Krichna, MD  ARIPiprazole  (ABILIFY ) 2 MG tablet Take 1 tablet (2 mg total) by mouth every evening. 04/04/24   Sowles, Krichna, MD  ascorbic acid  (VITAMIN C ) 100 MG tablet Take 500 mg by mouth 2 (two) times daily.    [provider]  ASPIRIN  LOW DOSE 81 MG  EC tablet TAKE 1 TABLET (81 MG TOTAL) BY MOUTH DAILY. 05/31/16   Sowles, Krichna, MD  B-D ULTRAFINE III SHORT PEN 31G X 8 MM MISC  01/25/23   [provider]  Boswellia-Glucosamine-Vit D (OSTEO BI-FLEX ONE PER DAY PO) Take by mouth.    [provider]  buPROPion  (WELLBUTRIN  XL) 150 MG 24 hr tablet Take 1 tablet (150 mg total) by mouth daily. 04/04/24   Sowles, Krichna, MD  chlorhexidine  (HIBICLENS ) 4 % external liquid Apply 15 mLs (1 Application total) topically as directed for 30 doses. Use as directed daily for 5 days every other week for 6 weeks. 05/19/24   Aberman, Zachary, MD  Cholecalciferol  (VITAMIN D3) 25 MCG (1000 UT) CAPS Take 1,000 Units by mouth daily. Patient taking differently: Take 2,000 Units by mouth 2 (two) times daily.    [provider]  Continuous Blood Gluc Sensor (FREESTYLE LIBRE 2 SENSOR) MISC FOR GLUCOSE MONITORING 06/27/22   [provider]  cyanocobalamin  (VITAMIN B12) 500 MCG tablet Take 500 mcg by mouth daily. Patient not taking: No sig reported    [provider]  docusate sodium  (COLACE) 100 MG capsule Take 1 capsule (100 mg total) by mouth 2 (two) times daily. 05/19/24   Charlene Debby BROCKS, PA-C  enoxaparin  (LOVENOX ) 40 MG/0.4ML injection Inject 0.4 mLs (40 mg total) into the skin daily for 14 days. 05/19/24 06/02/24  Charlene Debby BROCKS, PA-C  ezetimibe  (ZETIA ) 10 MG tablet Take 1 tablet (10 mg total) by mouth daily. 04/04/24   Sowles, Krichna, MD  FARXIGA  10 MG  TABS tablet Take 1 tablet (10 mg total) by mouth every morning. 04/04/24   Sowles, Krichna, MD  furosemide  (LASIX ) 20 MG tablet Take 1 tablet (20 mg total) by mouth daily. 04/04/24   Sowles, Krichna, MD  insulin  aspart (NOVOLOG  FLEXPEN) 100 UNIT/ML FlexPen Inject 14 Units into the skin in the morning and at bedtime. Patient taking differently: Inject 14 Units into the skin daily.    [provider]  Lactobacillus-Inulin (CULTURELLE DIGESTIVE DAILY PO) Take 15 Billion Cells by mouth daily.    [provider]  metoprolol  tartrate (LOPRESSOR ) 50 MG tablet Take 0.5 tablets (25 mg total) by mouth 2 (two) times daily. 04/04/24   Sowles, Krichna, MD  Misc Natural Products (OSTEO BI-FLEX ADV JOINT SHIELD PO) Take by mouth 2 (two) times daily. Patient taking differently: Take by mouth daily.    [provider]  mupirocin  ointment (BACTROBAN ) 2 % Place 1 Application into the nose 2 (two) times daily for 60 doses. Use as directed 2 times daily for 5 days every other week for 6 weeks. 05/19/24 06/18/24  Lorelle Hussar, MD  omeprazole  (PRILOSEC) 40 MG capsule Take 1 capsule (40 mg total) by mouth daily. Patient taking differently: Take 40 mg by mouth 2 (two) times daily. 04/04/24   Sowles, Krichna, MD  ondansetron  (ZOFRAN ) 4 MG tablet Take 1 tablet (4 mg total) by mouth every 6 (six) hours as needed for nausea. 05/19/24   Charlene Debby BROCKS, PA-C  oxyCODONE  (OXY IR/ROXICODONE ) 5 MG immediate release tablet Take 0.5-1 tablets (2.5-5 mg total) by mouth every 4 (four) hours as needed for moderate pain (pain score 4-6). 05/19/24   Charlene Debby BROCKS, PA-C  pregabalin  (LYRICA ) 150 MG capsule Take 1 capsule (150 mg total) by mouth 3 (three) times daily. Patient taking differently: Take 150 mg by mouth 2 (two) times daily. 04/04/24   Sowles, Krichna, MD  Probiotic Product (PROBIOTIC ADVANCED PO) Take  1 tablet by mouth daily.    [provider]  psyllium (METAMUCIL) 58.6 % powder Take 1 packet by  mouth daily.    [provider]  rosuvastatin  (CRESTOR ) 40 MG tablet Take 1 tablet (40 mg total) by mouth daily. 04/04/24   Sowles, Krichna, MD  Semaglutide  (OZEMPIC , 1 MG/DOSE, [place]) Inject into the skin once a week. On thursday    [provider]  sertraline  (ZOLOFT ) 100 MG tablet Take 1.5 tablets (150 mg total) by mouth daily. 04/04/24   Sowles, Krichna, MD  traZODone  (DESYREL ) 100 MG tablet Take 1 tablet (100 mg total) by mouth at bedtime as needed. for sleep Patient taking differently: Take 150 mg by mouth at bedtime. for sleep 04/04/24   Sowles, Krichna, MD  TRESIBA FLEXTOUCH 200 UNIT/ML FlexTouch Pen 85 Units daily. In morning 01/25/23   [provider]  VASCEPA  1 g capsule Take 2 capsules (2 g total) by mouth 2 (two) times daily. 04/04/24   Sowles, Krichna, MD      VITAL SIGNS:  Blood pressure 137/66, pulse (!) 109, temperature 98.5 F (36.9 C), temperature source Oral, resp. rate 16, SpO2 99%.  PHYSICAL EXAMINATION:  Physical Exam  GENERAL:  73 y.o.-year-old Caucasian female patient lying in the bed with no acute distress.  EYES: Pupils equal, round, reactive to light and accommodation. No scleral icterus. Extraocular muscles intact.  HEENT: Head atraumatic, normocephalic. Oropharynx and nasopharynx clear.  NECK:  Supple, no jugular venous distention. No thyroid  enlargement, no tenderness.  LUNGS: Normal breath sounds bilaterally, no wheezing, rales,rhonchi or crepitation. No use of accessory muscles of respiration.  CARDIOVASCULAR: Regular rate and rhythm, S1, S2 normal. No murmurs, rubs, or gallops.  ABDOMEN: Soft, nondistended, nontender. Bowel sounds present. No organomegaly or mass.  EXTREMITIES: No pedal edema, cyanosis, or clubbing.  NEUROLOGIC: Cranial nerves II through XII are intact. Muscle strength 5/5 in all extremities. Sensation intact. Gait not checked.  PSYCHIATRIC: The patient is alert and oriented x 3.  Normal affect and good eye  contact. SKIN: No obvious rash, lesion, or ulcer.   LABORATORY PANEL:   CBC Recent Labs  Lab 05/22/24 1549  WBC 15.7*  HGB 12.4  HCT 37.8  PLT 251   ------------------------------------------------------------------------------------------------------------------  Chemistries  Recent Labs  Lab 05/22/24 1549  NA 137  K 4.2  CL 100  CO2 18*  GLUCOSE 271*  BUN 24*  CREATININE 1.50*  CALCIUM  9.9  AST 25  ALT 12  ALKPHOS 63  BILITOT 1.5*   ------------------------------------------------------------------------------------------------------------------  Cardiac Enzymes No results for input(s): TROPONINI in the last 168 hours. ------------------------------------------------------------------------------------------------------------------  RADIOLOGY:  No results found.    IMPRESSION AND PLAN:  Assessment and Plan: * Sepsis due to gram-negative UTI [facility]) - The patient be admitted to a medical telemetry bed. - Will continue antibiotic therapy with IV Rocephin . - Will follow blood and urine cultures. - The patient will be hydrated with IV lactated ringer . - Her sepsis is manifested by tachycardia, tachypnea and leukocytosis.  Uncontrolled type 2 diabetes mellitus with hyperglycemia, with long-term current use of insulin  ([name]) - The patient will be placed on supplemental coverage with NovoLog . - Will continue basal coverage. - Will continue Lyrica  for diabetic neuropathy.  Dyslipidemia - Will continue statin therapy and Zetia .  Essential hypertension - Will continue antihypertensive therapy.  GERD without esophagitis - Will continue PPI therapy.  MDD (major depressive disorder)  - Will continue Wellbutrin  XL and Zoloft  as well as Abilify .  (HFpEF) heart failure with  preserved ejection fraction ([name]) Will continue Lasix  and Farxiga  as well as beta-blocker therapy.   DVT prophylaxis: Lovenox . Advanced Care Planning:  Code Status: full code.  Family  Communication:  The plan of care was discussed in details with the patient (and family). I answered all questions. The patient agreed to proceed with the above mentioned plan. Further management will depend upon hospital course. Disposition Plan: Back to previous home environment Consults called: none.  All the records are reviewed and case discussed with ED provider.  Status is: Inpatient  At the time of the admission, it appears that the appropriate admission status for this patient is inpatient.  This is judged to be reasonable and necessary in order to provide the required intensity of service to ensure the patient's safety given the presenting symptoms, physical exam findings and initial radiographic and laboratory data in the context of comorbid conditions.  The patient requires inpatient status due to high intensity of service, high risk of further deterioration and high frequency of surveillance required.  I certify that at the time of admission, it is my clinical judgment that the patient will require inpatient hospital care extending more than 2 midnights.                            Dispo: The patient is from: Home              Anticipated d/c is to: Home              Patient currently is not medically stable to d/c.              Difficult to place patient: No  Madison DELENA Peaches M.D on 05/22/2024 at 10:32 PM  [facility]   From 7 PM-7 AM, contact night-coverage [email or web address]  CC: Primary care physician; Sowles, Krichna, MD

## 2024-05-22 NOTE — Consult Note (Signed)
 CODE SEPSIS - PHARMACY COMMUNICATION  **[facility] Spectrum Antibiotics should be administered within 1 hour of Sepsis diagnosis**  Time Code Sepsis Called/Page Received: 2114  Antibiotics Ordered: ceftriaxone  at 2121 before code sepsis activated. Consult dc  Khairi Garman Rodriguez-Guzman PharmD, BCPS 05/22/2024 9:22 PM

## 2024-05-23 ENCOUNTER — Inpatient Hospital Stay

## 2024-05-23 DIAGNOSIS — N390 Urinary tract infection, site not specified: Secondary | ICD-10-CM | POA: Diagnosis not present

## 2024-05-23 DIAGNOSIS — A4150 Gram-negative sepsis, unspecified: Secondary | ICD-10-CM | POA: Diagnosis not present

## 2024-05-23 LAB — FOLATE: Folate: 12.5 ng/mL (ref >5.9)

## 2024-05-23 LAB — BASIC METABOLIC PANEL WITH GFR
Anion gap: 7 (ref 5–15)
BUN: 24 mg/dL — ABNORMAL HIGH (ref 8–23)
CO2: 26 mmol/L (ref 22–32)
Calcium: 9.2 mg/dL (ref 8.9–10.3)
Chloride: 109 mmol/L (ref 98–111)
Creatinine, Ser: 1.25 mg/dL — ABNORMAL HIGH (ref 0.44–1.00)
GFR, Estimated: 45 mL/min — ABNORMAL LOW (ref >60)
Glucose, Bld: 100 mg/dL — ABNORMAL HIGH (ref 70–99)
Potassium: 3.7 mmol/L (ref 3.5–5.1)
Sodium: 142 mmol/L (ref 135–145)

## 2024-05-23 LAB — CBC
HCT: 32.1 % — ABNORMAL LOW (ref 36.0–46.0)
Hemoglobin: 10.4 g/dL — ABNORMAL LOW (ref 12.0–15.0)
MCH: 29.3 pg (ref 26.0–34.0)
MCHC: 32.4 g/dL (ref 30.0–36.0)
MCV: 90.4 fL (ref 80.0–100.0)
Platelets: 192 K/uL (ref 150–400)
RBC: 3.55 MIL/uL — ABNORMAL LOW (ref 3.87–5.11)
RDW: 14.2 % (ref 11.5–15.5)
WBC: 9.8 K/uL (ref 4.0–10.5)
nRBC: 0 % (ref 0.0–0.2)

## 2024-05-23 LAB — GLUCOSE, CAPILLARY
Glucose-Capillary: 96 mg/dL (ref 70–99)
Glucose-Capillary: 203 mg/dL — ABNORMAL HIGH (ref 70–99)
Glucose-Capillary: 203 mg/dL — ABNORMAL HIGH (ref 70–99)

## 2024-05-23 LAB — PROTIME-INR
INR: 1.2 (ref 0.8–1.2)
Prothrombin Time: 15.9 s — ABNORMAL HIGH (ref 11.4–15.2)

## 2024-05-23 LAB — PHOSPHORUS: Phosphorus: 2 mg/dL — ABNORMAL LOW (ref 2.5–4.6)

## 2024-05-23 LAB — CORTISOL-AM, BLOOD: Cortisol - AM: 22.8 ug/dL — ABNORMAL HIGH (ref 6.7–22.6)

## 2024-05-23 LAB — MAGNESIUM: Magnesium: 2 mg/dL (ref 1.7–2.4)

## 2024-05-23 MED ORDER — INSULIN ASPART 100 UNIT/ML IJ SOLN
0.0000 [IU] | Freq: Three times a day (TID) | INTRAMUSCULAR | Status: DC
  Administered 2024-05-23 – 2024-05-25 (×4): 7 [IU] via SUBCUTANEOUS
  Administered 2024-05-25 (×2): 4 [IU] via SUBCUTANEOUS
  Administered 2024-05-26: 7 [IU] via SUBCUTANEOUS
  Administered 2024-05-26: 4 [IU] via SUBCUTANEOUS
  Administered 2024-05-26 – 2024-05-27 (×2): 7 [IU] via SUBCUTANEOUS
  Administered 2024-05-27: 11 [IU] via SUBCUTANEOUS
  Administered 2024-05-27: 4 [IU] via SUBCUTANEOUS
  Administered 2024-05-28: 7 [IU] via SUBCUTANEOUS
  Administered 2024-05-28: 4 [IU] via SUBCUTANEOUS
  Administered 2024-05-28 – 2024-05-29 (×2): 7 [IU] via SUBCUTANEOUS
  Administered 2024-05-29: 11 [IU] via SUBCUTANEOUS
  Administered 2024-05-29 – 2024-05-30 (×3): 7 [IU] via SUBCUTANEOUS
  Administered 2024-05-30: 11 [IU] via SUBCUTANEOUS
  Administered 2024-05-31: 7 [IU] via SUBCUTANEOUS
  Filled 2024-05-23 (×22): qty 1

## 2024-05-23 MED ORDER — INSULIN GLARGINE 100 UNIT/ML ~~LOC~~ SOLN
20.0000 [IU] | SUBCUTANEOUS | Status: DC
  Administered 2024-05-23 – 2024-05-27 (×5): 20 [IU] via SUBCUTANEOUS
  Filled 2024-05-23 (×6): qty 0.2

## 2024-05-23 MED ORDER — K PHOS MONO-SOD PHOS DI & MONO 155-852-130 MG PO TABS
500.0000 mg | ORAL_TABLET | Freq: Four times a day (QID) | ORAL | Status: AC
  Administered 2024-05-23 (×2): 500 mg via ORAL
  Filled 2024-05-23 (×2): qty 2

## 2024-05-23 MED ORDER — MUPIROCIN 2 % EX OINT
1.0000 | TOPICAL_OINTMENT | Freq: Two times a day (BID) | CUTANEOUS | Status: AC
  Administered 2024-05-23 – 2024-05-27 (×9): 1 via NASAL
  Filled 2024-05-23 (×2): qty 22

## 2024-05-23 NOTE — ED Notes (Signed)
 This RN collected AM labs.

## 2024-05-23 NOTE — Progress Notes (Signed)
 [facility] Progress Note  Patient: Summer [name]    FMW:[id number]  DOA: 05/22/2024     Date of Service: the patient was seen and examined on 05/23/2024  Chief Complaint  Patient presents with   Abdominal Pain   Knee Pain   Brief hospital course:  Summer [name] is a 73 y.o. Caucasian female with medical history significant for HFpEF, asthma, COPD, coronary artery disease, stage IV CKD, type 2 diabetes mellitus and PAD, depression and GERD, who presented to the emergency room with acute onset of altered mental status with confusion.  She admits to urinary frequency, urgency, dysuria without hematuria or flank pain.  She has been having chills but did not report any measured fever.  She had a left total knee arthroplasty on 05/19/2024 and has been taking pain medications since.  She has been very confused over the last couple of days.  She admitted to nausea without vomiting.  She admitted to mild lower abdominal discomfort.  No chest pain or palpitations.  No cough or wheezing or dyspnea.   ED Course: When the patient came to the ER, BP was 139/109 and heart rate was 120 with otherwise normal vital signs.  Labs revealed hyperglycemia of 271 and a CO2 of 18 with a anion gap of 19 however VBG showed HCO3 of 23 and pH of 7.39.  Total bili was 1.5 and LFTs were otherwise normal.  Lactic acid was 1.6.  CBC showed leukocytosis of 15.7.  UA came back positive for UTI.  Urine culture was sent as well as 2 blood cultures. EKG as reviewed by me : None. Imaging: None.   The patient was given 2 L bolus of IV normal saline and 8 units of IV NovoLog , 2 g of IV Rocephin  and will be admitted to a medical telemetry bed for further evaluation and management.    Assessment and Plan:  # Sepsis due to gram-negative UTI  sepsis is manifested by tachycardia, tachypnea and leukocytosis. - continue IV Rocephin . - follow blood and urine cultures. - s/p IV fluid for hydration  US  renal: No hydronephrosis or  nephrolithiasis. Hepatic steatosis.   # AKI on CKD stage IIIa Baseline creatinine sCr 1.42-21.5--1.25 Continue IV fluid for hydration  # Hypophosphatemia due to nutritional deficiency Phos repleted orally. Check electrolytes daily and replete as needed.   # Uncontrolled type 2 diabetes mellitus with hyperglycemia, with long-term current use of insulin : - The patient will be placed on supplemental coverage with NovoLog . - continue basal coverage. - continue Lyrica  for diabetic neuropathy.   # Dyslipidemia - continue statin therapy and Zetia .   # Essential hypertension - continue antihypertensive therapy.   # GERD without esophagitis - continue PPI therapy.   # MDD (major depressive disorder)  -continue Wellbutrin  XL and Zoloft  as well as Abilify .   # (HFpEF) heart failure with preserved ejection fraction  continue Lasix  and Farxiga  as well as beta-blocker therapy.   # S/p total left knee replacement done on 9/8 Orthopedic consulted, does not feel any signs of infection, recommended to resume formal physical therapy while in the hospital, weightbearing as tolerated on the left leg. She also was advised to ask one of her family members to bring in her Polar Care device so that ice therapy may be continued during this hospitalization.   Continue PT and OT   There is no height or weight on file to calculate BMI.  Interventions:  Diet: Carb modified and heart healthy diet DVT Prophylaxis:  Subcutaneous Lovenox    Advance goals of care discussion: Full code  Family Communication: family was not present at bedside, at the time of interview.  The pt provided permission to discuss medical plan with the family. Opportunity was given to ask question and all questions were answered satisfactorily.   Disposition:  Pt is from home, admitted with sepsis due to UTI, AKI, still on IV antibiotics, pending cultures, which precludes a safe discharge. Discharge to home versus SNF, TBD  after PT and OT eval, when stable, most likely in 1 to 2 days.  Subjective: No significant events overnight, patient feels improvement, denied any worsening of symptoms, she is complaining of pain in the left knee on the backside status post TKR done on 9/8 Denies any chest pain or palpitations, no shortness of breath  Physical Exam: General: NAD, lying comfortably Appear in no distress, affect appropriate Eyes: PERRLA ENT: Oral Mucosa Clear, moist  Neck: no JVD,  Cardiovascular: S1 and S2 Present, no Murmur,  Respiratory: good respiratory effort, Bilateral Air entry equal and Decreased, no Crackles, no wheezes Abdomen: Bowel Sound present, Soft and no tenderness,  Skin: no rashes Extremities: s/p left TKR, swollen and tender, ?  Cellulitis, no significant pedal edema Neurologic: without any new focal findings Gait not checked due to patient safety concerns  Vitals:   05/23/24 0745 05/23/24 1002 05/23/24 1100 05/23/24 1149  BP: (!) 157/70 (!) 155/76 (!) 143/54 (!) 153/61  Pulse: 75 78 72 71  Resp: 14  18 18   Temp:   98.6 F (37 C) 98.9 F (37.2 C)  TempSrc:   Oral Oral  SpO2: 99%  95% 98%    Intake/Output Summary (Last 24 hours) at 05/23/2024 1449 Last data filed at 05/23/2024 1128 Gross per 24 hour  Intake --  Output 800 ml  Net -800 ml   There were no vitals filed for this visit.  Data Reviewed: I have personally reviewed and interpreted daily labs, tele strips, imagings as discussed above. I reviewed all nursing notes, pharmacy notes, vitals, pertinent old records I have discussed plan of care as described above with RN and patient/family.  CBC: Recent Labs  Lab 05/20/24 0619 05/22/24 1549 05/23/24 0751  WBC 12.3* 15.7* 9.8  HGB 11.1* 12.4 10.4*  HCT 33.6* 37.8 32.1*  MCV 90.8 89.6 90.4  PLT 173 251 192   Basic Metabolic Panel: Recent Labs  Lab 05/20/24 0619 05/22/24 1549 05/23/24 0751  NA 140 137 142  K 4.6 4.2 3.7  CL 105 100 109  CO2 24 18* 26   GLUCOSE 202* 271* 100*  BUN 37* 24* 24*  CREATININE 1.42* 1.50* 1.25*  CALCIUM  8.9 9.9 9.2  MG  --   --  2.0  PHOS  --   --  2.0*    Studies: DG Knee Complete 4 Views Left Result Date: 05/23/2024 CLINICAL DATA:  114157 Status post total knee replacement using cement, left 114157 EXAM: LEFT KNEE - COMPLETE 4+ VIEW COMPARISON:  05/19/2024 FINDINGS: Well-aligned knee arthroplasty with patellar resurfacing without acute fracture.No periprosthetic lucency to suggest loosening.No dislocation. Expected diffuse subcutaneous edema. No radiopaque foreign body or retained surgical instrument. IMPRESSION: Well-aligned knee arthroplasty without acute fracture or dislocation.No radiopaque foreign body or retained surgical instrument. Electronically Signed   By: Rogelia Myers M.D.   On: 05/23/2024 14:20    Scheduled Meds:  amLODipine   5 mg Oral Daily   ARIPiprazole   2 mg Oral QPM   ascorbic acid   500 mg Oral BID  aspirin  EC  81 mg Oral Daily   buPROPion   150 mg Oral Daily   cholecalciferol   2,000 Units Oral BID   dapagliflozin  propanediol  10 mg Oral q morning   docusate sodium   100 mg Oral BID   enoxaparin  (LOVENOX ) injection  0.5 mg/kg Subcutaneous Q24H   ezetimibe   10 mg Oral Daily   furosemide   20 mg Oral Daily   icosapent  Ethyl  2 g Oral BID   insulin  aspart  0-20 Units Subcutaneous TID WC   insulin  glargine  20 Units Subcutaneous Q24H   metoprolol  tartrate  25 mg Oral BID   mupirocin  ointment  1 Application Nasal BID   pregabalin   150 mg Oral BID   rosuvastatin   40 mg Oral Daily   sertraline   150 mg Oral Daily   traZODone   150 mg Oral QHS   Continuous Infusions:  cefTRIAXone  (ROCEPHIN )  IV     lactated ringers  150 mL/hr (05/23/24 1432)   PRN Meds: acetaminophen  **OR** acetaminophen , magnesium  hydroxide, ondansetron  **OR** ondansetron  (ZOFRAN ) IV, oxyCODONE   Time spent: 55 minutes  Author: ELVAN SOR. MD [facility] 05/23/2024 2:49 PM  To reach On-call, see care teams  to locate the attending and reach out to them via [email or web address]. If 7PM-7AM, please contact night-coverage If you still have difficulty reaching the attending provider, please page the [facility] (Director on Call) for [facility] on amion for assistance.

## 2024-05-23 NOTE — Plan of Care (Signed)

## 2024-05-23 NOTE — TOC CM/SW Note (Signed)
 Transition of Care [facility]) - Inpatient Brief Assessment   Patient Details  Name: Summer [name] MRN: [id number] Date of Birth: 15-Dec-1950  [facility]) CM/SW Contact:    Alfonso Rummer, LCSW Phone Number: 05/23/2024, 4:55 PM   Clinical Narrative: Summer [name] Rummer completed completed code 12 with patient. Summer [name] was alert and verbalize understand of assessment and code 63. Pcp is Dr. Glenard, pt uses [facility], pt reports no barriers. TOC did not identify any barriers. Please consult should need arise.    Transition of Care Asessment: Insurance and Status: Insurance coverage has been reviewed Patient has primary care physician: Yes Home environment has been reviewed: single family home   Prior/Current Home Services: No current home services Social Drivers of Health Review: SDOH reviewed no interventions necessary   Transition of care needs: no transition of care needs at this time

## 2024-05-23 NOTE — Inpatient Diabetes Management (Signed)
 Inpatient Diabetes Program Recommendations  AACE/ADA: New Consensus Statement on Inpatient Glycemic Control (2015)  Target Ranges:  Prepandial:   less than 140 mg/dL      Peak postprandial:   less than 180 mg/dL (1-2 hours)      Critically ill patients:  140 - 180 mg/dL    92/82/74 99:99  Hemoglobin A1C 7.7 (E)  (E): External lab result  Latest Reference Range & Units 05/22/24 21:27 05/22/24 23:28  Glucose-Capillary 70 - 99 mg/dL 732 (H)  8 units Novolog   + IVF Bolus  201 (H)  85 units Lantus   (H): Data is abnormally high     Admit with: Sepsis due to gram-negative UTI   History: DM, CKD  Home DM Meds: Freestyle Libre 3 CGM       Farxiga  10 mg Daily       Novolog  14 units daily       Tresiba 85 units daily       Ozempic  1 mg Qweek         Current Orders: Lantus  20 units Q24H     Novolog  Resistant Correction Scale/ SSI (0-20 units) TID AC     ENDO: Dr. Cherilyn with [facility] Last Seen 03/27/2024 No Changes made to regimen at that visit Currently takes: U200 Tresiba 85 units once daily Novolog  14 units with meals plus sliding scale 500 mg of MTF twice a day Farxiga  10 mg daily Ozempic  1 mg once weekly      --Will follow patient during hospitalization--  Adina Rudolpho Arrow RN, MSN, CDCES Diabetes Coordinator Inpatient Glycemic Control Team Team Pager: [phone] (8a-5p)

## 2024-05-23 NOTE — Consult Note (Signed)
 ORTHOPAEDIC CONSULTATION  REQUESTING PHYSICIAN: Von Bellis, MD  Chief Complaint:   Status post left total knee replacement.  History of Present Illness: Summer [name] is a 73 y.o. female with multiple medical problems including coronary artery disease, congestive heart failure, hyperlipidemia, hypertension, hypothyroidism, obesity, COPD, diabetes, chronic kidney disease, and sleep apnea who is now 4 days status post a left total knee arthroplasty performed by Dr. Lorelle.  The patient was discharged home the following day.  Over the next 48 hours, she developed urinary frequency, urgency, and dysuria, and began to experience chills but no fevers.  She also admits to being confused.  All of the symptoms prompted her to be brought back to the emergency room where laboratory workup confirmed the presence of a urinary tract infection.  The patient has been admitted for definitive management of this issue as well as for optimization of other laboratory abnormalities.  Past Medical History:  Diagnosis Date   (HFpEF) heart failure with preserved ejection fraction ([name])    Anemia    Aortic atherosclerosis ([name])    Arthritis    Asthma    B12 deficiency    Back pain    CAD (coronary artery disease)    Carpal tunnel syndrome    Cerebral microvascular disease    Charcot foot due to diabetes mellitus ([name])    CKD (chronic kidney disease), stage IV ([name])    Complication of anesthesia    a.) early emergence   Constipation    COPD (chronic obstructive pulmonary disease) ([name])    Depression    Diabetic peripheral neuropathy ([name])    Dyspnea    GERD (gastroesophageal reflux disease)    Headache    Hiatal hernia    History of kidney stones    Hyperlipidemia    Hypertension    Hypothyroidism    IBS (irritable bowel syndrome)    Insomnia    a.) takes trazodone  PRN   Joint pain    Long-term use of aspirin  therapy    Memory loss     Obesity    OSA (obstructive sleep apnea)    a.) does not utilize nocturnal PAP therapy   PAD (peripheral artery disease) ([name])    Pneumonia    Posterior reversible encephalopathy syndrome    Rhinitis, allergic    Rosacea    SOB (shortness of breath)    T2DM (type 2 diabetes mellitus) ([name])    Unspecified hearing loss    Vitamin D  deficiency    Past Surgical History:  Procedure Laterality Date   ABDOMINAL HYSTERECTOMY  1975   AMPUTATION TOE Left 05/02/2023   Procedure: LEFT FOOT PARTIAL AMPUTATION 1st & 2nd TOE;  Surgeon: Silva Juliene SAUNDERS, DPM;  Location: [facility] ORS;  Service: Orthopedics/Podiatry;  Laterality: Left;   ANKLE SURGERY Left approx Jan 2018   APPENDECTOMY  1970   CATARACT EXTRACTION Bilateral 01/2011   right   COLONOSCOPY WITH PROPOFOL  N/A 08/19/2019   Procedure: COLONOSCOPY WITH PROPOFOL ;  Surgeon: Therisa Bi, MD;  Location: [facility] ENDOSCOPY;  Service: Gastroenterology;  Laterality: N/A;   eye lid surgery  2013   bilateral   FOOT SURGERY     LOWER EXTREMITY ANGIOGRAPHY Left 11/23/2020   Procedure: LOWER EXTREMITY ANGIOGRAPHY;  Surgeon: Jama Cordella MATSU, MD;  Location: [facility] INVASIVE CV LAB;  Service: Cardiovascular;  Laterality: Left;   NECK SURGERY     SPINE SURGERY     TEE WITHOUT CARDIOVERSION N/A 08/16/2022   Procedure: TRANSESOPHAGEAL ECHOCARDIOGRAM (TEE);  Surgeon: Darliss Rogue,  MD;  Location: [facility] ORS;  Service: Cardiovascular;  Laterality: N/A;   TOTAL KNEE ARTHROPLASTY Left 05/19/2024   Procedure: ARTHROPLASTY, KNEE, TOTAL;  Surgeon: Lorelle Hussar, MD;  Location: [facility] ORS;  Service: Orthopedics;  Laterality: Left;   TUBAL LIGATION     VAGINAL HYSTERECTOMY  1989   Social History   Socioeconomic History   Marital status: Significant Other    Spouse name: Company secretary   Number of children: 1   Years of education: Not on file   Highest education level: High school graduate  Occupational History   Occupation: retired  Tobacco Use   Smoking  status: Never   Smokeless tobacco: Never  Vaping Use   Vaping status: Never Used  Substance and Sexual Activity   Alcohol use: No    Alcohol/week: 0.0 standard drinks of alcohol   Drug use: No   Sexual activity: Yes    Partners: Male  Other Topics Concern   Not on file  Social History Narrative   Son lives in [place]  (retired from [employer]) and her granddaughter (82yrs old) lives in [place] with girlfriend   Social Drivers of Corporate investment banker Strain: Low Risk  (05/14/2024)   Received from [facility]   Overall Financial Resource Strain (CARDIA)    Difficulty of Paying Living Expenses: Not very hard  Food Insecurity: No Food Insecurity (05/14/2024)   Received from [facility]   Hunger Vital Sign    Within the past 12 months, you worried that your food would run out before you got the money to buy more.: Never true    Within the past 12 months, the food you bought just didn't last and you didn't have money to get more.: Never true  Transportation Needs: No Transportation Needs (05/14/2024)   Received from [facility] - Transportation    In the past 12 months, has lack of transportation kept you from medical appointments or from getting medications?: No    Lack of Transportation (Non-Medical): No  Physical Activity: Inactive (08/30/2021)   Exercise Vital Sign    Days of Exercise per Week: 0 days    Minutes of Exercise per Session: 0 min  Stress: No Stress Concern Present (08/30/2021)   [employer] of Occupational Health - Occupational Stress Questionnaire    Feeling of Stress : Not at all  Social Connections: Moderately Isolated (08/30/2021)   Social Connection and Isolation Panel    Frequency of Communication with Friends and Family: More than three times a week    Frequency of Social Gatherings with Friends and Family: Once a week    Attends Religious Services: Never    Database administrator or  Organizations: No    Attends Engineer, structural: Never    Marital Status: Living with partner   Family History  Problem Relation Age of Onset   Aneurysm Mother    Aortic aneurysm Mother    Hypertension Mother    Hyperlipidemia Mother    Heart disease Mother    Obesity Mother    Heart attack Maternal Grandfather    Diabetes Maternal Grandfather    Allergies  Allergen Reactions   Codeine Other (See Comments)    TRIPPED OUT  DIDN'T LIKE THE WAY IT FELT   Atorvastatin     muscle pain   Hydrocodone      itching   Tramadol     Latex Rash   Zolpidem  Other (See Comments)  Sleep walk   Prior to Admission medications   Medication Sig Start Date End Date Taking? Authorizing Provider  acetaminophen  (TYLENOL ) 500 MG tablet Take 2 tablets (1,000 mg total) by mouth every 8 (eight) hours. 05/19/24  Yes Charlene Debby BROCKS, PA-C  ALBUTEROL  IN Inhale 2 puffs into the lungs as needed.   Yes [provider]  amLODipine  (NORVASC ) 5 MG tablet Take 1 tablet (5 mg total) by mouth daily. 04/04/24  Yes Sowles, Krichna, MD  ARIPiprazole  (ABILIFY ) 2 MG tablet Take 1 tablet (2 mg total) by mouth every evening. 04/04/24  Yes Sowles, Krichna, MD  ASPIRIN  LOW DOSE 81 MG EC tablet TAKE 1 TABLET (81 MG TOTAL) BY MOUTH DAILY. 05/31/16  Yes Sowles, Krichna, MD  Boswellia-Glucosamine-Vit D (OSTEO BI-FLEX ONE PER DAY PO) Take 1 capsule by mouth every morning.   Yes [provider]  buPROPion  (WELLBUTRIN  XL) 150 MG 24 hr tablet Take 1 tablet (150 mg total) by mouth daily. 04/04/24  Yes Sowles, Krichna, MD  chlorhexidine  (HIBICLENS ) 4 % external liquid Apply 15 mLs (1 Application total) topically as directed for 30 doses. Use as directed daily for 5 days every other week for 6 weeks. 05/19/24  Yes Aberman, Zachary, MD  Cholecalciferol  (VITAMIN D3) 25 MCG (1000 UT) CAPS Take 1,000 Units by mouth daily. Patient taking differently: Take 2,000 Units by mouth 2 (two) times daily.   Yes [provider]  docusate sodium  (COLACE) 100 MG capsule Take 1 capsule (100 mg total) by mouth 2 (two) times daily. 05/19/24  Yes Charlene Debby BROCKS, PA-C  enoxaparin  (LOVENOX ) 40 MG/0.4ML injection Inject 0.4 mLs (40 mg total) into the skin daily for 14 days. 05/19/24 06/02/24 Yes Charlene Debby BROCKS, PA-C  ezetimibe  (ZETIA ) 10 MG tablet Take 1 tablet (10 mg total) by mouth daily. 04/04/24  Yes Sowles, Krichna, MD  FARXIGA  10 MG TABS tablet Take 1 tablet (10 mg total) by mouth every morning. 04/04/24  Yes Sowles, Krichna, MD  furosemide  (LASIX ) 20 MG tablet Take 1 tablet (20 mg total) by mouth daily. 04/04/24  Yes Sowles, Krichna, MD  insulin  aspart (NOVOLOG  FLEXPEN) 100 UNIT/ML FlexPen Inject 14 Units into the skin in the morning and at bedtime. Patient taking differently: Inject 14 Units into the skin daily.   Yes [provider]  Lactobacillus-Inulin (CULTURELLE DIGESTIVE DAILY PO) Take 15 Billion Cells by mouth daily.   Yes [provider]  metoprolol  tartrate (LOPRESSOR ) 50 MG tablet Take 0.5 tablets (25 mg total) by mouth 2 (two) times daily. 04/04/24  Yes Sowles, Krichna, MD  mupirocin  ointment (BACTROBAN ) 2 % Place 1 Application into the nose 2 (two) times daily for 60 doses. Use as directed 2 times daily for 5 days every other week for 6 weeks. 05/19/24 06/18/24 Yes Lorelle Hussar, MD  omeprazole  (PRILOSEC) 40 MG capsule Take 1 capsule (40 mg total) by mouth daily. 04/04/24  Yes Sowles, Krichna, MD  ondansetron  (ZOFRAN ) 4 MG tablet Take 1 tablet (4 mg total) by mouth every 6 (six) hours as needed for nausea. 05/19/24  Yes Charlene Debby BROCKS, PA-C  oxyCODONE  (OXY IR/ROXICODONE ) 5 MG immediate release tablet Take 0.5-1 tablets (2.5-5 mg total) by mouth every 4 (four) hours as needed for moderate pain (pain score 4-6). 05/19/24  Yes Charlene Debby BROCKS, PA-C  pregabalin  (LYRICA ) 150 MG capsule Take 1 capsule (150 mg total) by mouth 3 (three) times daily. Patient taking differently: Take 150 mg by  mouth 2 (two) times daily. 04/04/24  Yes Sowles, Krichna, MD  psyllium (METAMUCIL) 58.6 % powder Take 1 packet by mouth daily.   Yes [provider]  rosuvastatin  (CRESTOR ) 40 MG tablet Take 1 tablet (40 mg total) by mouth daily. 04/04/24  Yes Sowles, Krichna, MD  Semaglutide  (OZEMPIC , 1 MG/DOSE, [place]) Inject into the skin once a week. On thursday   Yes [provider]  sertraline  (ZOLOFT ) 100 MG tablet Take 1.5 tablets (150 mg total) by mouth daily. 04/04/24  Yes Sowles, Krichna, MD  traZODone  (DESYREL ) 100 MG tablet Take 1 tablet (100 mg total) by mouth at bedtime as needed. for sleep Patient taking differently: Take 150 mg by mouth at bedtime. for sleep 04/04/24  Yes Sowles, Krichna, MD  TRESIBA FLEXTOUCH 200 UNIT/ML FlexTouch Pen 85 Units daily. In morning 01/25/23  Yes [provider]  VASCEPA  1 g capsule Take 2 capsules (2 g total) by mouth 2 (two) times daily. 04/04/24  Yes Sowles, Krichna, MD  B-D ULTRAFINE III SHORT PEN 31G X 8 MM MISC  01/25/23   [provider]  Continuous Blood Gluc Sensor (FREESTYLE LIBRE 2 SENSOR) MISC FOR GLUCOSE MONITORING 06/27/22   [provider]  Misc Natural Products (OSTEO BI-FLEX ADV JOINT SHIELD PO) Take by mouth 2 (two) times daily. Patient taking differently: Take by mouth daily.    [provider]   US  RENAL Result Date: 05/23/2024 CLINICAL DATA:  [id number] Complicated UTI (urinary tract infection) 380444 EXAM: RENAL / URINARY TRACT ULTRASOUND COMPLETE COMPARISON:  10/14/2019 FINDINGS: Right Kidney: Renal measurements: 10.2 x 5.6 x 5.1 cm. Normal echogenicity. No mass. No hydronephrosis or nephrolithiasis. Left Kidney: Renal measurements: 9.6 x 4.6 x 4.9 cm. Normal echogenicity. No mass. No hydronephrosis or nephrolithiasis. Bladder: Appears normal for degree of bladder distention. Other: Hepatic steatosis. IMPRESSION: 1. No hydronephrosis or nephrolithiasis. 2. Hepatic steatosis. Electronically Signed   By: Rogelia Myers M.D.   On: 05/23/2024 16:03   DG Knee Complete 4 Views Left Result Date: 05/23/2024 CLINICAL DATA:  [id number] Status post total knee replacement using cement, left [id number] EXAM: LEFT KNEE - COMPLETE 4+ VIEW COMPARISON:  05/19/2024 FINDINGS: Well-aligned knee arthroplasty with patellar resurfacing without acute fracture.No periprosthetic lucency to suggest loosening.No dislocation. Expected diffuse subcutaneous edema. No radiopaque foreign body or retained surgical instrument. IMPRESSION: Well-aligned knee arthroplasty without acute fracture or dislocation.No radiopaque foreign body or retained surgical instrument. Electronically Signed   By: Rogelia Myers M.D.   On: 05/23/2024 14:20    Positive ROS: All other systems have been reviewed and were otherwise negative with the exception of those mentioned in the HPI and as above.  Physical Exam: General:  Alert, no acute distress Psychiatric:  Patient is competent for consent with normal mood and affect   Cardiovascular:  No pedal edema Respiratory:  No wheezing, non-labored breathing GI:  Abdomen is soft and non-tender Skin:  No lesions in the area of chief complaint Neurologic:  Sensation intact distally Lymphatic:  No axillary or cervical lymphadenopathy  Orthopedic Exam:  Orthopedic examination is limited to left knee and lower extremity.  Skin inspection of the left knee is notable for a recent anterior midline surgical incision that appears to be healing well and is without evidence for infection.  There is mild swelling and resolving areas of ecchymosis around the knee, consistent with the fact that she is only 4 days out from surgery, but no erythema, abrasions, or other skin abnormalities are identified.  There is a trace effusion, again consistent with the  fact that she is only 4 days out from surgery.  Actively, she is able to perform a straight leg raise and flex her knee from 10 to 60 degrees with only mild difficulty.  She is  grossly neurovascularly intact to the left lower leg and foot.  X-rays:  AP, lateral, and oblique views of the left knee are available for review and have been reviewed by myself.  These films demonstrate excellent position of the femoral, tibial, and patellar components which are without evidence for infection.  There does not appear to be any significant effusion in the knee.  No other acute bony abnormalities are identified.  Assessment: Status post recent left total knee arthroplasty.  Plan: The treatment options have been discussed with the patient.  Based on today's examination, I do not feel that there is any infection involving the left knee at this time.  Therefore, I feel that she may resume formal physical therapy while in the hospital, weightbearing as tolerated on the left leg.  She also was advised to ask one of her family members to bring in her Polar Care device so that ice therapy may be continued during this hospitalization.    Thank you for asking me to participate in the care of this most pleasant woman.  I will be sure to let Dr. Lorelle know that she was admitted for this urinary tract infection.  I will sign off at this time.  If you have further questions during this hospitalization, please reconsult us .   Summer Reyes Maltos, MD  Beeper #:  [phone]  05/23/2024 5:41 PM

## 2024-05-24 DIAGNOSIS — N390 Urinary tract infection, site not specified: Secondary | ICD-10-CM | POA: Diagnosis not present

## 2024-05-24 DIAGNOSIS — A4150 Gram-negative sepsis, unspecified: Secondary | ICD-10-CM | POA: Diagnosis not present

## 2024-05-24 LAB — BASIC METABOLIC PANEL WITH GFR
Anion gap: 12 (ref 5–15)
BUN: 29 mg/dL — ABNORMAL HIGH (ref 8–23)
CO2: 23 mmol/L (ref 22–32)
Calcium: 9 mg/dL (ref 8.9–10.3)
Chloride: 105 mmol/L (ref 98–111)
Creatinine, Ser: 1.3 mg/dL — ABNORMAL HIGH (ref 0.44–1.00)
GFR, Estimated: 43 mL/min — ABNORMAL LOW (ref >60)
Glucose, Bld: 117 mg/dL — ABNORMAL HIGH (ref 70–99)
Potassium: 3.5 mmol/L (ref 3.5–5.1)
Sodium: 140 mmol/L (ref 135–145)

## 2024-05-24 LAB — GLUCOSE, CAPILLARY
Glucose-Capillary: 115 mg/dL — ABNORMAL HIGH (ref 70–99)
Glucose-Capillary: 229 mg/dL — ABNORMAL HIGH (ref 70–99)
Glucose-Capillary: 216 mg/dL — ABNORMAL HIGH (ref 70–99)
Glucose-Capillary: 206 mg/dL — ABNORMAL HIGH (ref 70–99)

## 2024-05-24 LAB — URINE CULTURE

## 2024-05-24 LAB — CBC
HCT: 29.8 % — ABNORMAL LOW (ref 36.0–46.0)
Hemoglobin: 10.1 g/dL — ABNORMAL LOW (ref 12.0–15.0)
MCH: 30 pg (ref 26.0–34.0)
MCHC: 33.9 g/dL (ref 30.0–36.0)
MCV: 88.4 fL (ref 80.0–100.0)
Platelets: 189 K/uL (ref 150–400)
RBC: 3.37 MIL/uL — ABNORMAL LOW (ref 3.87–5.11)
RDW: 14.3 % (ref 11.5–15.5)
WBC: 8.4 K/uL (ref 4.0–10.5)
nRBC: 0 % (ref 0.0–0.2)

## 2024-05-24 LAB — MAGNESIUM: Magnesium: 1.9 mg/dL (ref 1.7–2.4)

## 2024-05-24 LAB — PHOSPHORUS: Phosphorus: 3.8 mg/dL (ref 2.5–4.6)

## 2024-05-24 MED ORDER — FUROSEMIDE 20 MG PO TABS
20.0000 mg | ORAL_TABLET | Freq: Every day | ORAL | Status: DC
Start: 2024-05-27 — End: 2024-05-28
  Administered 2024-05-27: 20 mg via ORAL
  Filled 2024-05-24 (×2): qty 1

## 2024-05-24 MED ORDER — SODIUM CHLORIDE 0.9 % IV SOLN
2.0000 g | INTRAVENOUS | Status: AC
  Administered 2024-05-24: 2 g via INTRAVENOUS
  Filled 2024-05-24: qty 20

## 2024-05-24 NOTE — TOC Initial Note (Signed)
 Transition of Care [facility]) - Initial/Assessment Note    Patient Details  Name: Summer [name] MRN: [id number] Date of Birth: 12/27/1950  [facility] CM/SW Contact:    [place] L [place] Straus, LCSW Phone Number: 05/24/2024, 2:31 PM  Clinical Narrative:                  CSW met with patient. No family at bedside. CSW discussed recommendations. Patient wants bed searches for SNF placement. She advised that she lives home alone with her partner who she has been in a relationship with for 30 years. Patient had no preference for SNF choice.   Patient advised that she would like placement in [facility]. Patient advised that she has a 4WW and a wheelchair. Prescriptions are filled at  [facility] in [place].   FL2 will be completed.        Patient Goals and CMS Choice            Expected Discharge Plan and Services                                              Prior Living Arrangements/Services                       Activities of Daily Living      Permission Sought/Granted                  Emotional Assessment              Admission diagnosis:  Lower urinary tract infectious disease [N39.0] Sepsis due to gram-negative UTI ([name]) [A41.50, N39.0] Sepsis, due to unspecified organism, unspecified whether acute organ dysfunction present [facility]) [A41.9] Patient Active Problem List   Diagnosis Date Noted   Sepsis due to gram-negative UTI ([name]) 05/22/2024   MDD (major depressive disorder) 05/22/2024   Dyslipidemia 05/22/2024   GERD without esophagitis 05/22/2024   S/P TKR (total knee replacement), left 05/19/2024   Long-term use of aspirin  therapy    Secondary hyperparathyroidism of renal origin ([name]) 04/04/2024   CKD stage 3a, GFR 45-59 ml/min ([name]) 05/01/2023   Small vessel disease ([name]) 03/27/2023   Coronary artery disease involving native coronary artery of native heart without angina pectoris 03/16/2023   First degree AV block 03/16/2023    Seizure ([name]) 08/20/2022   Gouty arthritis 08/15/2022   Atherosclerosis of aorta ([name]) 07/04/2022   Insomnia due to other mental disorder 07/04/2022   Amputated toe of left foot ([name]) 07/04/2022   Chronic obstructive pulmonary disease ([name]) 01/28/2021   PAD (peripheral artery disease) ([name]) 12/03/2020   B12 deficiency 05/24/2020   Vitamin D  deficiency 05/24/2020   Metatarsalgia of left foot 01/15/2020   MDD (major depressive disorder), recurrent episode, moderate ([name]) 04/04/2019   (HFpEF) heart failure with preserved ejection fraction ([name]) 08/31/2017   Moderate persistent asthma 08/31/2017   Dyslipidemia associated with type 2 diabetes mellitus ([name]) 06/14/2017   Uncontrolled type 2 diabetes mellitus with hyperglycemia, with long-term current use of insulin  ([name]) 08/18/2016   Charcot foot due to diabetes mellitus ([name]) 02/22/2016   Acquired abduction deformity of foot 07/12/2015   Osteoarthritis of subtalar joint 07/12/2015   Carpal tunnel syndrome 04/17/2015   Chronic constipation 04/17/2015   Stage 3b chronic kidney disease ([name]) 04/17/2015   Decreased exercise tolerance 04/17/2015   Diabetes mellitus with polyneuropathy ([name]) 04/17/2015  Gastroesophageal reflux disease without esophagitis 04/17/2015   Bursitis, trochanteric 04/17/2015   Essential hypertension 04/17/2015   Hearing loss 04/17/2015   Chronic recurrent major depressive disorder ([name]) 04/17/2015   Morbid obesity with BMI of 40.0-44.9, adult ([name]) 04/17/2015   Hypo-ovarianism 04/17/2015   Perennial allergic rhinitis with seasonal variation 04/17/2015   Acne erythematosa 04/17/2015   Dyskinesia, tardive 04/17/2015   Impingement syndrome of shoulder 04/17/2015   Obstructive sleep apnea 05/14/2014   Mixed hyperlipidemia 02/06/2012   Low back pain 09/16/2008   PCP:  Glenard Mire, MD Pharmacy:   [facility] - [place], [place] - [address] [place]  [address] Phone: [phone] Fax: [phone]  [facility] [address], [place] - [address] [place] [address] Phone: [phone] Fax: [phone]     Social Drivers of Health (SDOH) Social History: SDOH Screenings   Food Insecurity: No Food Insecurity (05/23/2024)  Housing: Unknown (05/23/2024)  Transportation Needs: No Transportation Needs (05/23/2024)  Utilities: Not At Risk (05/23/2024)  Alcohol Screen: Low Risk  (08/30/2021)  Depression (PHQ2-9): Medium Risk (04/04/2024)  Financial Resource Strain: Low Risk  (05/14/2024)   Received from [facility]  Physical Activity: Inactive (08/30/2021)  Social Connections: Moderately Isolated (05/23/2024)  Stress: No Stress Concern Present (08/30/2021)  Tobacco Use: Low Risk  (05/19/2024)   SDOH Interventions:     Readmission Risk Interventions    05/23/2024    4:54 PM 05/02/2023    2:39 PM  Readmission Risk Prevention Plan  Transportation Screening Complete Complete  PCP or Specialist Appt within 3-5 Days Complete Complete  HRI or Home Care Consult  Complete  Social Work Consult for Recovery Care Planning/Counseling Complete Complete  Palliative Care Screening Not Applicable Not Applicable  Medication Review Oceanographer) Complete Referral to Pharmacy

## 2024-05-24 NOTE — Evaluation (Signed)
 Occupational Therapy Evaluation Patient Details Name: Summer [name] MRN: [id number] DOB: April 19, 1951 Today's Date: 05/24/2024   History of Present Illness   Summer [name] is a 73 y.o. female with multiple medical problems including coronary artery disease, congestive heart failure, hyperlipidemia, hypertension, hypothyroidism, obesity, COPD, diabetes, chronic kidney disease, and sleep apnea, now status post a left total knee arthroplasty on 05/19/24 who presents to the ED 4 days post op with UTI.     Clinical Impressions [name] was seen for OT evaluation this date. Prior to TKA, pt was independent-MOD I for ADL management using a 4WW for mobility. Pt lives with her boyfriend in a 1 level home with a ramped entrance. Pt presents with deficits in strength, balance, and activity tolerance, affecting safe and optimal ADL completion. Pt currently requires MAX A for LB ADL management, MIN A for STS transfers and bed mobility. Pt would benefit from skilled OT services to address noted impairments and functional limitations (see below for any additional details) in order to maximize safety and independence while minimizing future risk of falls, injury, and readmission. Anticipate the need for follow up OT services upon acute hospital DC.      If plan is discharge home, recommend the following:   A lot of help with bathing/dressing/bathroom;Assistance with cooking/housework;A little help with walking and/or transfers;Assist for transportation;Help with stairs or ramp for entrance     Functional Status Assessment   Patient has had a recent decline in their functional status and demonstrates the ability to make significant improvements in function in a reasonable and predictable amount of time.     Equipment Recommendations   BSC/3in1     Recommendations for Other Services         Precautions/Restrictions   Precautions Precautions: Knee Recall of Precautions/Restrictions:  Intact Restrictions Weight Bearing Restrictions Per Provider Order: Yes LLE Weight Bearing Per Provider Order: Weight bearing as tolerated     Mobility Bed Mobility Overal bed mobility: Needs Assistance Bed Mobility: Supine to Sit     Supine to sit: Min assist, HOB elevated          Transfers Overall transfer level: Needs assistance Equipment used: Rolling walker (2 wheels) Transfers: Sit to/from Stand Sit to Stand: Min assist           General transfer comment: cues for hand placement      Balance Overall balance assessment: Needs assistance   Sitting balance-Leahy Scale: Good Sitting balance - Comments: steady static sitting, reaching inside BOS.   Standing balance support: Bilateral upper extremity supported Standing balance-Leahy Scale: Fair                             ADL either performed or assessed with clinical judgement   ADL Overall ADL's : Needs assistance/impaired                                     Functional mobility during ADLs: Minimal assistance;Cueing for safety;Rolling walker (2 wheels) General ADL Comments: MIN A for bed mobility and to step pivot to recliner. Anticipate MIN A for step pivot transfer to [facility]. MAX A to doff/don compression stockings and hospital socks.     Vision Patient Visual Report: No change from baseline       Perception         Praxis  Pertinent Vitals/Pain Pain Assessment Pain Assessment: Faces Pain Score: 8  Pain Location: L knee Pain Descriptors / Indicators: Sore, Aching Pain Intervention(s): Limited activity within patient's tolerance, Monitored during session, Repositioned     Extremity/Trunk Assessment Upper Extremity Assessment Upper Extremity Assessment: Overall WFL for tasks assessed   Lower Extremity Assessment Lower Extremity Assessment: LLE deficits/detail;Defer to PT evaluation LLE Deficits / Details: s/p L TKA, WBAT, LLE remains externally rotated,  difficult for pt to achieve neutral positioning. Per pt this is baseline.       Communication Communication Communication: No apparent difficulties   Cognition Arousal: Alert Behavior During Therapy: WFL for tasks assessed/performed                                 Following commands: Intact       Cueing  General Comments   Cueing Techniques: Verbal cues;Gestural cues;Tactile cues  L knee bandage clean, dry, intact at start/end of session.   Exercises Other Exercises Other Exercises: Pt educated on role of OT in acute setting, safety, falls prevention strategies, knee precautions, compression stocking management, and DC recs. Would benefit from further education on LB ADL management with and without AE.   Shoulder Instructions      Home Living Family/patient expects to be discharged to:: Private residence Living Arrangements: Spouse/significant other Available Help at Discharge: Family;Available 24 hours/day Type of Home: House Home Access: Ramped entrance     Home Layout: One level     Bathroom Shower/Tub: Producer, television/film/video: Standard     Home Equipment: Rollator (4 wheels);Toilet riser;Shower seat          Prior Functioning/Environment Prior Level of Function : Independent/Modified Independent             Mobility Comments: MOD I with rollator prior to TKA. Denies falls history. ADLs Comments: Generally independent or MOD I for ADL/IADL.    OT Problem List: Decreased strength;Decreased coordination;Pain;Decreased activity tolerance;Decreased safety awareness;Impaired balance (sitting and/or standing);Decreased knowledge of use of DME or AE;Decreased knowledge of precautions;Decreased range of motion   OT Treatment/Interventions: Self-care/ADL training;Therapeutic exercise;Therapeutic activities;DME and/or AE instruction;Patient/family education;Balance training;Energy conservation      OT Goals(Current goals can be found  in the care plan section)   Acute Rehab OT Goals Patient Stated Goal: To go home OT Goal Formulation: With patient Time For Goal Achievement: 06/07/24 Potential to Achieve Goals: Good   OT Frequency:  Min 2X/week    Co-evaluation              AM-PAC OT 6 Clicks Daily Activity     Outcome Measure Help from another person eating meals?: None Help from another person taking care of personal grooming?: A Little Help from another person toileting, which includes using toliet, bedpan, or urinal?: A Little Help from another person bathing (including washing, rinsing, drying)?: A Lot Help from another person to put on and taking off regular upper body clothing?: A Little Help from another person to put on and taking off regular lower body clothing?: A Lot 6 Click Score: 17   End of Session Equipment Utilized During Treatment: Gait belt;Rolling walker (2 wheels) Nurse Communication: Mobility status;Patient requests pain meds  Activity Tolerance: Patient tolerated treatment well Patient left: in chair;with call bell/phone within reach;with chair alarm set  OT Visit Diagnosis: Other abnormalities of gait and mobility (R26.89);Pain Pain - Right/Left: Left Pain - part of body: Knee  Time: 9157-9094 OT Time Calculation (min): 23 min Charges:  OT General Charges $OT Visit: 1 Visit OT Evaluation $OT Eval Moderate Complexity: 1 Mod OT Treatments $Self Care/Home Management : 8-22 mins  Jhonny Pelton, M.S., OTR/L 05/24/24, 12:17 PM

## 2024-05-24 NOTE — Plan of Care (Signed)

## 2024-05-24 NOTE — Evaluation (Signed)
 Physical Therapy Evaluation Patient Details Name: Summer [name] MRN: [id number] DOB: 1951/06/22 Today's Date: 05/24/2024  History of Present Illness  Summer [name] is a 73 y.o. female with multiple medical problems including coronary artery disease, congestive heart failure, hyperlipidemia, hypertension, hypothyroidism, obesity, COPD, diabetes, chronic kidney disease, and sleep apnea, now status post a left total knee arthroplasty on 05/19/24 who presents to the ED 4 days post op with UTI.  Clinical Impression  Patient resting in bed upon arrival to room; endorses up in chair earlier for breakfast.  Alert and oriented to basic information, follows commands and agreeable to participation with session.  Endorses R ankle pain, L knee pain; FACSE 6/10; meds received prior to session. Patient generally weak and deconditioned throughout all extremities, focal strength/weakness to L knee (see details below) due to recent TKR.  Currently requiring cga/close sup for bed mobility; min assist for sit/stand, standing balance and bed/chair transfer with RW.  Demonstrates short, shuffling stepping pattern; tends to pivot versus truly step with transfer efforts. Heavily reliant on RW and +1 assist for optimal safety with transfer.  Additional gait deferred as result; will continue to assess/progress in subsequent sessions as medically appropriate. Would benefit from skilled PT to address above deficits and promote optimal return to PLOF.; recommend post-acute PT follow up as indicated by interdisciplinary care team.            If plan is discharge home, recommend the following: A little help with bathing/dressing/bathroom;Assistance with cooking/housework;Assist for transportation;Help with stairs or ramp for entrance   Can travel by private vehicle   Yes    Equipment Recommendations    Recommendations for Other Services       Functional Status Assessment Patient has had a recent decline in their functional  status and demonstrates the ability to make significant improvements in function in a reasonable and predictable amount of time.     Precautions / Restrictions Precautions Precautions: Knee Recall of Precautions/Restrictions: Intact Restrictions Weight Bearing Restrictions Per Provider Order: Yes LLE Weight Bearing Per Provider Order: Weight bearing as tolerated      Mobility  Bed Mobility Overal bed mobility: Needs Assistance Bed Mobility: Supine to Sit     Supine to sit: Min assist          Transfers Overall transfer level: Needs assistance Equipment used: Rolling walker (2 wheels) Transfers: Sit to/from Stand, Bed to chair/wheelchair/BSC Sit to Stand: Min assist Stand pivot transfers: Min assist         General transfer comment: cuing for hand placement, tends to pull on RW    Ambulation/Gait               General Gait Details: deferred due to pain and generalized weakness  Stairs            Wheelchair Mobility     Tilt Bed    Modified Rankin (Stroke Patients Only)       Balance Overall balance assessment: Needs assistance Sitting-balance support: No upper extremity supported, Feet supported Sitting balance-Leahy Scale: Good     Standing balance support: Bilateral upper extremity supported Standing balance-Leahy Scale: Fair                               Pertinent Vitals/Pain Pain Assessment Pain Assessment: Faces Faces Pain Scale: Hurts little more Pain Location: L knee Pain Descriptors / Indicators: Sore, Aching Pain Intervention(s): Limited activity within patient's tolerance, Monitored  during session, Repositioned    Home Living Family/patient expects to be discharged to:: Private residence Living Arrangements: Spouse/significant other Available Help at Discharge: Family;Available 24 hours/day Type of Home: House Home Access: Ramped entrance       Home Layout: One level Home Equipment: Rollator (4  wheels);Toilet riser;Shower Counsellor (2 wheels)      Prior Function Prior Level of Function : Independent/Modified Independent             Mobility Comments: MOD I with rollator prior to TKA; has been ambulatory with RW/4WRW since TKR. Denies falls history. ADLs Comments: Generally independent or MOD I for ADL/IADL.     Extremity/Trunk Assessment   Upper Extremity Assessment Upper Extremity Assessment: Overall WFL for tasks assessed    Lower Extremity Assessment Lower Extremity Assessment:  (L knee grossly 5-70 degrees, end-ranges limited by pain.  L LE with excessive ER in resting position) LLE Deficits / Details: s/p L TKA, WBAT, LLE remains externally rotated, difficult for pt to achieve neutral positioning. Per pt this is baseline.       Communication   Communication Communication: No apparent difficulties    Cognition Arousal: Alert Behavior During Therapy: WFL for tasks assessed/performed   PT - Cognitive impairments: No apparent impairments                         Following commands: Intact       Cueing Cueing Techniques: Verbal cues, Gestural cues, Tactile cues     General Comments General comments (skin integrity, edema, etc.): L knee bandage clean, dry, intact at start/end of session.    Exercises Total Joint Exercises Goniometric ROM: 5-70 degrees Other Exercises Other Exercises: L LE supine therex, act assist, emphasis on end-range stretching and movement: ankle pumps, quad sets, SAQs, heel slides, SLR. Fair L quad activation; limited by pain   Assessment/Plan    PT Assessment Patient needs continued PT services  PT Problem List Decreased strength;Decreased range of motion;Decreased activity tolerance;Decreased balance;Decreased mobility;Decreased knowledge of use of DME;Decreased safety awareness;Decreased knowledge of precautions       PT Treatment Interventions DME instruction;Balance training;Gait training;Neuromuscular  re-education;Functional mobility training;Patient/family education;Therapeutic activities;Therapeutic exercise    PT Goals (Current goals can be found in the Care Plan section)  Acute Rehab PT Goals Patient Stated Goal: to get back home PT Goal Formulation: With patient Time For Goal Achievement: 06/07/24 Potential to Achieve Goals: Good    Frequency 7X/week     Co-evaluation               AM-PAC PT 6 Clicks Mobility  Outcome Measure Help needed turning from your back to your side while in a flat bed without using bedrails?: None Help needed moving from lying on your back to sitting on the side of a flat bed without using bedrails?: A Little Help needed moving to and from a bed to a chair (including a wheelchair)?: A Little Help needed standing up from a chair using your arms (e.g., wheelchair or bedside chair)?: A Little Help needed to walk in hospital room?: A Little Help needed climbing 3-5 steps with a railing? : A Lot 6 Click Score: 18    End of Session   Activity Tolerance: Patient tolerated treatment well Patient left: in chair;with call bell/phone within reach;with chair alarm set Nurse Communication: Mobility status PT Visit Diagnosis: Muscle weakness (generalized) (M62.81);Other abnormalities of gait and mobility (R26.89);Difficulty in walking, not elsewhere classified (R26.2);Pain Pain -  Right/Left: Left Pain - part of body: Knee    Time: 8888-8870 PT Time Calculation (min) (ACUTE ONLY): 18 min   Charges:   PT Evaluation $PT Eval Moderate Complexity: 1 Mod   PT General Charges $$ ACUTE PT VISIT: 1 Visit         Arlyce Circle H. Delores, PT, DPT, NCS 05/24/24, 1:20 PM [phone]

## 2024-05-24 NOTE — NC FL2 (Signed)
 [place]  MEDICAID FL2 LEVEL OF CARE FORM     IDENTIFICATION  Patient Name: Summer [name] Birthdate: Feb 08, 1951 Sex: female Admission Date (Current Location): 05/22/2024  [facility] [place] Number:  Chiropodist and Address:  [facility], [address], [place], [place] [address]      Provider Number: [id number]  Attending Physician Name and Address:  Von Bellis, MD  Relative Name and Phone Number:  Jameson Ards Pric[place])  [phone]    Current Level of Care: Hospital Recommended Level of Care: Skilled Nursing Facility Prior Approval Number:    Date Approved/Denied:   PASRR Number: [id number]  Discharge Plan: SNF    Current Diagnoses: Patient Active Problem List   Diagnosis Date Noted   Sepsis due to gram-negative UTI ([name]) 05/22/2024   MDD (major depressive disorder) 05/22/2024   Dyslipidemia 05/22/2024   GERD without esophagitis 05/22/2024   S/P TKR (total knee replacement), left 05/19/2024   Long-term use of aspirin  therapy    Secondary hyperparathyroidism of renal origin ([name]) 04/04/2024   CKD stage 3a, GFR 45-59 ml/min ([name]) 05/01/2023   Small vessel disease ([name]) 03/27/2023   Coronary artery disease involving native coronary artery of native heart without angina pectoris 03/16/2023   First degree AV block 03/16/2023   Seizure ([name]) 08/20/2022   Gouty arthritis 08/15/2022   Atherosclerosis of aorta ([name]) 07/04/2022   Insomnia due to other mental disorder 07/04/2022   Amputated toe of left foot ([name]) 07/04/2022   Chronic obstructive pulmonary disease ([name]) 01/28/2021   PAD (peripheral artery disease) ([name]) 12/03/2020   B12 deficiency 05/24/2020   Vitamin D  deficiency 05/24/2020   Metatarsalgia of left foot 01/15/2020   MDD (major depressive disorder), recurrent episode, moderate ([name]) 04/04/2019   (HFpEF) heart failure with preserved ejection fraction ([name]) 08/31/2017   Moderate persistent asthma 08/31/2017    Dyslipidemia associated with type 2 diabetes mellitus ([name]) 06/14/2017   Uncontrolled type 2 diabetes mellitus with hyperglycemia, with long-term current use of insulin  ([name]) 08/18/2016   Charcot foot due to diabetes mellitus ([name]) 02/22/2016   Acquired abduction deformity of foot 07/12/2015   Osteoarthritis of subtalar joint 07/12/2015   Carpal tunnel syndrome 04/17/2015   Chronic constipation 04/17/2015   Stage 3b chronic kidney disease ([name]) 04/17/2015   Decreased exercise tolerance 04/17/2015   Diabetes mellitus with polyneuropathy ([name]) 04/17/2015   Gastroesophageal reflux disease without esophagitis 04/17/2015   Bursitis, trochanteric 04/17/2015   Essential hypertension 04/17/2015   Hearing loss 04/17/2015   Chronic recurrent major depressive disorder ([name]) 04/17/2015   Morbid obesity with BMI of 40.0-44.9, adult ([name]) 04/17/2015   Hypo-ovarianism 04/17/2015   Perennial allergic rhinitis with seasonal variation 04/17/2015   Acne erythematosa 04/17/2015   Dyskinesia, tardive 04/17/2015   Impingement syndrome of shoulder 04/17/2015   Obstructive sleep apnea 05/14/2014   Mixed hyperlipidemia 02/06/2012   Low back pain 09/16/2008    Orientation RESPIRATION BLADDER Height & Weight     Self, Time, Situation, Place  Normal (good respiratory effort, Bilateral Air entry equal and Decreased, no Crackles, no wheezes) Continent Weight:   Height:     BEHAVIORAL SYMPTOMS/MOOD NEUROLOGICAL BOWEL NUTRITION STATUS      Continent Diet (Carb modified and heart healthy diet)  AMBULATORY STATUS COMMUNICATION OF NEEDS Skin   Extensive Assist Verbally Normal, Other (Comment) (no rashes)                       Personal Care Assistance Level of Assistance  Bathing, Feeding, Dressing Bathing Assistance: Limited assistance Feeding assistance: Independent Dressing Assistance: Maximum assistance     Functional Limitations Info  Sight, Hearing, Speech Sight Info: Adequate (PERRLA) Hearing  Info: Adequate Speech Info: Adequate    SPECIAL CARE FACTORS FREQUENCY  PT (By licensed PT), OT (By licensed OT)     PT Frequency: 7x OT Frequency: 7x            Contractures Contractures Info: Present    Additional Factors Info  Code Status, Allergies Code Status Info: FULL Allergies Info: Atorvastatin Not Specified   muscle pain  Hydrocodone  Not Specified   itching  Tramadol  Not Specified     Latex Low Allergy Rash   Zolpidem            Current Medications (05/24/2024):  This is the current hospital active medication list Current Facility-Administered Medications  Medication Dose Route Frequency Provider Last Rate Last Admin   acetaminophen  (TYLENOL ) tablet 650 mg  650 mg Oral Q6H PRN Mansy, Jan A, MD   650 mg at 05/23/24 0253   Or   acetaminophen  (TYLENOL ) suppository 650 mg  650 mg Rectal Q6H PRN Mansy, Jan A, MD       amLODipine  (NORVASC ) tablet 5 mg  5 mg Oral Daily Mansy, Jan A, MD   5 mg at 05/24/24 0934   ARIPiprazole  (ABILIFY ) tablet 2 mg  2 mg Oral QPM Mansy, Jan A, MD   2 mg at 05/23/24 2155   ascorbic acid  (VITAMIN C ) tablet 500 mg  500 mg Oral BID Mansy, Jan A, MD   500 mg at 05/24/24 0934   aspirin  EC tablet 81 mg  81 mg Oral Daily Mansy, Jan A, MD   81 mg at 05/24/24 0934   buPROPion  (WELLBUTRIN  XL) 24 hr tablet 150 mg  150 mg Oral Daily Mansy, Jan A, MD   150 mg at 05/24/24 0934   cefTRIAXone  (ROCEPHIN ) 2 g in sodium chloride  0.9 % 100 mL IVPB  2 g Intravenous Q24H Von Bellis, MD       cholecalciferol  (VITAMIN D3) 25 MCG (1000 UNIT) tablet 2,000 Units  2,000 Units Oral BID Mansy, Jan A, MD   2,000 Units at 05/24/24 0934   dapagliflozin  propanediol (FARXIGA ) tablet 10 mg  10 mg Oral q morning Mansy, Jan A, MD   10 mg at 05/24/24 0933   docusate sodium  (COLACE) capsule 100 mg  100 mg Oral BID Mansy, Jan A, MD   100 mg at 05/24/24 9065   enoxaparin  (LOVENOX ) injection 52.5 mg  0.5 mg/kg Subcutaneous Q24H Mansy, Jan A, MD   52.5 mg at 05/23/24 2114   ezetimibe   (ZETIA ) tablet 10 mg  10 mg Oral Daily Mansy, Jan A, MD   10 mg at 05/24/24 0933   [START ON 05/27/2024] furosemide  (LASIX ) tablet 20 mg  20 mg Oral Daily Von Bellis, MD       icosapent  Ethyl (VASCEPA ) 1 g capsule 2 g  2 g Oral BID Mansy, Jan A, MD   2 g at 05/24/24 0933   insulin  aspart (novoLOG ) injection 0-20 Units  0-20 Units Subcutaneous TID WC Von Bellis, MD   7 Units at 05/24/24 1253   insulin  glargine (LANTUS ) injection 20 Units  20 Units Subcutaneous Q24H Von Bellis, MD   20 Units at 05/23/24 2114   magnesium  hydroxide (MILK OF MAGNESIA) suspension 30 mL  30 mL Oral Daily PRN Mansy, Jan A, MD       metoprolol  tartrate (LOPRESSOR ) tablet  25 mg  25 mg Oral BID Mansy, Jan A, MD   25 mg at 05/24/24 0934   mupirocin  ointment (BACTROBAN ) 2 % 1 Application  1 Application Nasal BID Mansy, Jan A, MD   1 Application at 05/23/24 2129   ondansetron  (ZOFRAN ) tablet 4 mg  4 mg Oral Q6H PRN Mansy, Jan A, MD       Or   ondansetron  (ZOFRAN ) injection 4 mg  4 mg Intravenous Q6H PRN Mansy, Jan A, MD   4 mg at 05/23/24 1439   oxyCODONE  (Oxy IR/ROXICODONE ) immediate release tablet 2.5-5 mg  2.5-5 mg Oral Q4H PRN Mansy, Jan A, MD   5 mg at 05/24/24 0936   pregabalin  (LYRICA ) capsule 150 mg  150 mg Oral BID Mansy, Jan A, MD   150 mg at 05/24/24 0934   rosuvastatin  (CRESTOR ) tablet 40 mg  40 mg Oral Daily Mansy, Jan A, MD   40 mg at 05/24/24 0933   sertraline  (ZOLOFT ) tablet 150 mg  150 mg Oral Daily Mansy, Jan A, MD   150 mg at 05/24/24 9065   traZODone  (DESYREL ) tablet 150 mg  150 mg Oral QHS Mansy, Jan A, MD   150 mg at 05/23/24 2111     Discharge Medications: Please see discharge summary for a list of discharge medications.  Relevant Imaging Results:  Relevant Lab Results:   Additional Information [id number]  [place] L Shelanda Duvall, LCSW

## 2024-05-24 NOTE — Plan of Care (Signed)
  Problem: Fluid Volume: Goal: Ability to maintain a balanced intake and output will improve Outcome: Progressing   Problem: Nutritional: Goal: Maintenance of adequate nutrition will improve Outcome: Progressing   Problem: Activity: Goal: Risk for activity intolerance will decrease Outcome: Progressing   Problem: Nutrition: Goal: Adequate nutrition will be maintained Outcome: Progressing   Problem: Coping: Goal: Level of anxiety will decrease Outcome: Progressing   Problem: Elimination: Goal: Will not experience complications related to urinary retention Outcome: Progressing   Problem: Pain Managment: Goal: General experience of comfort will improve and/or be controlled Outcome: Progressing

## 2024-05-24 NOTE — Progress Notes (Signed)
 [facility] Progress Note  Patient: Summer [name]    FMW:[id number]  DOA: 05/22/2024     Date of Service: the patient was seen and examined on 05/24/2024  Chief Complaint  Patient presents with   Abdominal Pain   Knee Pain   Brief hospital course:  Summer [name] is a 73 y.o. Caucasian female with medical history significant for HFpEF, asthma, COPD, coronary artery disease, stage IV CKD, type 2 diabetes mellitus and PAD, depression and GERD, who presented to the emergency room with acute onset of altered mental status with confusion.  She admits to urinary frequency, urgency, dysuria without hematuria or flank pain.  She has been having chills but did not report any measured fever.  She had a left total knee arthroplasty on 05/19/2024 and has been taking pain medications since.  She has been very confused over the last couple of days.  She admitted to nausea without vomiting.  She admitted to mild lower abdominal discomfort.  No chest pain or palpitations.  No cough or wheezing or dyspnea.   ED Course: When the patient came to the ER, BP was 139/109 and heart rate was 120 with otherwise normal vital signs.  Labs revealed hyperglycemia of 271 and a CO2 of 18 with a anion gap of 19 however VBG showed HCO3 of 23 and pH of 7.39.  Total bili was 1.5 and LFTs were otherwise normal.  Lactic acid was 1.6.  CBC showed leukocytosis of 15.7.  UA came back positive for UTI.  Urine culture was sent as well as 2 blood cultures. EKG as reviewed by me : None. Imaging: None.   The patient was given 2 L bolus of IV normal saline and 8 units of IV NovoLog , 2 g of IV Rocephin  and will be admitted to a medical telemetry bed for further evaluation and management.    Assessment and Plan:  # Sepsis due to gram-negative UTI  sepsis is manifested by tachycardia, tachypnea and leukocytosis. - continue IV Rocephin  x 3 days till 9/13 - blood culture NGTD and urine cultures grew multiple species, recommended  recollection - s/p IV fluid for hydration  US  renal: No hydronephrosis or nephrolithiasis. Hepatic steatosis.   # AKI on CKD stage IIIa Baseline creatinine sCr 1.42-21.5--1.25>>1.3 Continue IV fluid for hydration  # Hypophosphatemia due to nutritional deficiency Phos repleted orally. Check electrolytes daily and replete as needed.   # Uncontrolled type 2 diabetes mellitus with hyperglycemia, with long-term current use of insulin : - The patient will be placed on supplemental coverage with NovoLog . - continue basal coverage. - continue Lyrica  for diabetic neuropathy.   # Dyslipidemia - continue statin therapy and Zetia .   # Essential hypertension - continue antihypertensive therapy.   # GERD without esophagitis - continue PPI therapy.   # MDD (major depressive disorder)  -continue Wellbutrin  XL and Zoloft  as well as Abilify .   # (HFpEF) heart failure with preserved ejection fraction  continue Lasix  and Farxiga  as well as beta-blocker therapy.   # S/p total left knee replacement done on 9/8 Orthopedic consulted, does not feel any signs of infection, recommended to resume formal physical therapy while in the hospital, weightbearing as tolerated on the left leg. She also was advised to ask one of her family members to bring in her Polar Care device so that ice therapy may be continued during this hospitalization.   Continue PT and OT   There is no height or weight on file to calculate BMI.  Interventions:  Diet: Carb modified and heart healthy diet DVT Prophylaxis: Subcutaneous Lovenox    Advance goals of care discussion: Full code  Family Communication: family was not present at bedside, at the time of interview.  The pt provided permission to discuss medical plan with the family. Opportunity was given to ask question and all questions were answered satisfactorily.   Disposition:  Pt is from home, admitted with sepsis due to UTI, AKI, still on IV antibiotics, but stable  to discharge to rehab. Discharge to SNF, when bed would be available.  TOC consulted  Subjective: No significant events overnight, patient did work with physical therapy and she was sitting on the recliner, she needs a lot of help, PT recommended SNF.  Left knee pain 4/10, complaining of right ankle pain 8/10 due to wrapping which was removed Patient denied any other complaints She agreed for SNF placement.   Physical Exam: General: NAD, lying comfortably Appear in no distress, affect appropriate Eyes: PERRLA ENT: Oral Mucosa Clear, moist  Neck: no JVD,  Cardiovascular: S1 and S2 Present, no Murmur,  Respiratory: good respiratory effort, Bilateral Air entry equal and Decreased, no Crackles, no wheezes Abdomen: Bowel Sound present, Soft and no tenderness,  Skin: no rashes Extremities: s/p left TKR, swollen and tender, ?  Cellulitis, no significant pedal edema Neurologic: without any new focal findings Gait not checked due to patient safety concerns  Vitals:   05/23/24 1705 05/23/24 1946 05/24/24 0334 05/24/24 0749  BP: (!) 136/54 (!) 134/46 (!) 139/47 (!) 134/48  Pulse: 81 79 69 71  Resp: 16 16 16 16   Temp: 98.3 F (36.8 C) 98.3 F (36.8 C) 98.6 F (37 C) 98.2 F (36.8 C)  TempSrc: Oral Oral Oral Oral  SpO2: 98% 97% 92% 100%    Intake/Output Summary (Last 24 hours) at 05/24/2024 1306 Last data filed at 05/24/2024 1050 Gross per 24 hour  Intake 2440 ml  Output 1700 ml  Net 740 ml   There were no vitals filed for this visit.  Data Reviewed: I have personally reviewed and interpreted daily labs, tele strips, imagings as discussed above. I reviewed all nursing notes, pharmacy notes, vitals, pertinent old records I have discussed plan of care as described above with RN and patient/family.  CBC: Recent Labs  Lab 05/20/24 0619 05/22/24 1549 05/23/24 0751 05/24/24 0621  WBC 12.3* 15.7* 9.8 8.4  HGB 11.1* 12.4 10.4* 10.1*  HCT 33.6* 37.8 32.1* 29.8*  MCV 90.8 89.6  90.4 88.4  PLT 173 251 192 189   Basic Metabolic Panel: Recent Labs  Lab 05/20/24 0619 05/22/24 1549 05/23/24 0751 05/24/24 0621  NA 140 137 142 140  K 4.6 4.2 3.7 3.5  CL 105 100 109 105  CO2 24 18* 26 23  GLUCOSE 202* 271* 100* 117*  BUN 37* 24* 24* 29*  CREATININE 1.42* 1.50* 1.25* 1.30*  CALCIUM  8.9 9.9 9.2 9.0  MG  --   --  2.0 1.9  PHOS  --   --  2.0* 3.8    Studies: US  RENAL Result Date: 05/23/2024 CLINICAL DATA:  [id number] Complicated UTI (urinary tract infection) [id number] EXAM: RENAL / URINARY TRACT ULTRASOUND COMPLETE COMPARISON:  10/14/2019 FINDINGS: Right Kidney: Renal measurements: 10.2 x 5.6 x 5.1 cm. Normal echogenicity. No mass. No hydronephrosis or nephrolithiasis. Left Kidney: Renal measurements: 9.6 x 4.6 x 4.9 cm. Normal echogenicity. No mass. No hydronephrosis or nephrolithiasis. Bladder: Appears normal for degree of bladder distention. Other: Hepatic steatosis. IMPRESSION: 1. No hydronephrosis or  nephrolithiasis. 2. Hepatic steatosis. Electronically Signed   By: Rogelia Myers M.D.   On: 05/23/2024 16:03    Scheduled Meds:  amLODipine   5 mg Oral Daily   ARIPiprazole   2 mg Oral QPM   ascorbic acid   500 mg Oral BID   aspirin  EC  81 mg Oral Daily   buPROPion   150 mg Oral Daily   cholecalciferol   2,000 Units Oral BID   dapagliflozin  propanediol  10 mg Oral q morning   docusate sodium   100 mg Oral BID   enoxaparin  (LOVENOX ) injection  0.5 mg/kg Subcutaneous Q24H   ezetimibe   10 mg Oral Daily   furosemide   20 mg Oral Daily   icosapent  Ethyl  2 g Oral BID   insulin  aspart  0-20 Units Subcutaneous TID WC   insulin  glargine  20 Units Subcutaneous Q24H   metoprolol  tartrate  25 mg Oral BID   mupirocin  ointment  1 Application Nasal BID   pregabalin   150 mg Oral BID   rosuvastatin   40 mg Oral Daily   sertraline   150 mg Oral Daily   traZODone   150 mg Oral QHS   Continuous Infusions:  cefTRIAXone  (ROCEPHIN )  IV 2 g (05/23/24 2120)   PRN Meds: acetaminophen   **OR** acetaminophen , magnesium  hydroxide, ondansetron  **OR** ondansetron  (ZOFRAN ) IV, oxyCODONE   Time spent: 40 minutes  Author: ELVAN SOR. MD [facility] 05/24/2024 1:06 PM  To reach On-call, see care teams to locate the attending and reach out to them via [email or web address]. If 7PM-7AM, please contact night-coverage If you still have difficulty reaching the attending provider, please page the [facility] (Director on Call) for [facility] on amion for assistance.

## 2024-05-25 DIAGNOSIS — A4150 Gram-negative sepsis, unspecified: Secondary | ICD-10-CM | POA: Diagnosis not present

## 2024-05-25 DIAGNOSIS — N390 Urinary tract infection, site not specified: Secondary | ICD-10-CM | POA: Diagnosis not present

## 2024-05-25 LAB — CBC
HCT: 32.7 % — ABNORMAL LOW (ref 36.0–46.0)
Hemoglobin: 10.6 g/dL — ABNORMAL LOW (ref 12.0–15.0)
MCH: 28.6 pg (ref 26.0–34.0)
MCHC: 32.4 g/dL (ref 30.0–36.0)
MCV: 88.1 fL (ref 80.0–100.0)
Platelets: 210 K/uL (ref 150–400)
RBC: 3.71 MIL/uL — ABNORMAL LOW (ref 3.87–5.11)
RDW: 14 % (ref 11.5–15.5)
WBC: 8 K/uL (ref 4.0–10.5)
nRBC: 0 % (ref 0.0–0.2)

## 2024-05-25 LAB — GLUCOSE, CAPILLARY
Glucose-Capillary: 156 mg/dL — ABNORMAL HIGH (ref 70–99)
Glucose-Capillary: 202 mg/dL — ABNORMAL HIGH (ref 70–99)
Glucose-Capillary: 159 mg/dL — ABNORMAL HIGH (ref 70–99)
Glucose-Capillary: 254 mg/dL — ABNORMAL HIGH (ref 70–99)

## 2024-05-25 LAB — BASIC METABOLIC PANEL WITH GFR
Anion gap: 11 (ref 5–15)
BUN: 35 mg/dL — ABNORMAL HIGH (ref 8–23)
CO2: 25 mmol/L (ref 22–32)
Calcium: 9.3 mg/dL (ref 8.9–10.3)
Chloride: 101 mmol/L (ref 98–111)
Creatinine, Ser: 1.28 mg/dL — ABNORMAL HIGH (ref 0.44–1.00)
GFR, Estimated: 44 mL/min — ABNORMAL LOW (ref >60)
Glucose, Bld: 164 mg/dL — ABNORMAL HIGH (ref 70–99)
Potassium: 3.8 mmol/L (ref 3.5–5.1)
Sodium: 137 mmol/L (ref 135–145)

## 2024-05-25 LAB — MAGNESIUM: Magnesium: 2 mg/dL (ref 1.7–2.4)

## 2024-05-25 LAB — PHOSPHORUS: Phosphorus: 3.8 mg/dL (ref 2.5–4.6)

## 2024-05-25 NOTE — Progress Notes (Signed)
 [facility] Progress Note  Patient: Summer [name]    FMW:[id number]  DOA: 05/22/2024     Date of Service: the patient was seen and examined on 05/25/2024  Chief Complaint  Patient presents with   Abdominal Pain   Knee Pain   Brief hospital course:  Summer [name] is a 73 y.o. Caucasian female with medical history significant for HFpEF, asthma, COPD, coronary artery disease, stage IV CKD, type 2 diabetes mellitus and PAD, depression and GERD, who presented to the emergency room with acute onset of altered mental status with confusion.  She admits to urinary frequency, urgency, dysuria without hematuria or flank pain.  She has been having chills but did not report any measured fever.  She had a left total knee arthroplasty on 05/19/2024 and has been taking pain medications since.  She has been very confused over the last couple of days.  She admitted to nausea without vomiting.  She admitted to mild lower abdominal discomfort.  No chest pain or palpitations.  No cough or wheezing or dyspnea.   ED Course: When the patient came to the ER, BP was 139/109 and heart rate was 120 with otherwise normal vital signs.  Labs revealed hyperglycemia of 271 and a CO2 of 18 with a anion gap of 19 however VBG showed HCO3 of 23 and pH of 7.39.  Total bili was 1.5 and LFTs were otherwise normal.  Lactic acid was 1.6.  CBC showed leukocytosis of 15.7.  UA came back positive for UTI.  Urine culture was sent as well as 2 blood cultures. EKG as reviewed by me : None. Imaging: None.   The patient was given 2 L bolus of IV normal saline and 8 units of IV NovoLog , 2 g of IV Rocephin  and will be admitted to a medical telemetry bed for further evaluation and management.    Assessment and Plan:  # Sepsis due to UTI  sepsis is manifested by tachycardia, tachypnea and leukocytosis. - continue IV Rocephin  x 3 days till 9/13 - blood culture NGTD and urine cultures grew multiple species, recommended recollection - s/p IV  fluid for hydration  US  renal: No hydronephrosis or nephrolithiasis. Hepatic steatosis.   # AKI on CKD stage IIIa Baseline creatinine sCr 1.42-21.5--1.25>>1.28 S/p IV fluid for hydration  # Hypophosphatemia due to nutritional deficiency Phos repleted orally. Check electrolytes daily and replete as needed.   # Uncontrolled type 2 diabetes mellitus with hyperglycemia, with long-term current use of insulin : - The patient will be placed on supplemental coverage with NovoLog . - continue basal coverage. - continue Lyrica  for diabetic neuropathy.   # Dyslipidemia - continue statin therapy and Zetia .   # Essential hypertension - continue antihypertensive therapy.   # GERD without esophagitis - continue PPI therapy.   # MDD (major depressive disorder)  -continue Wellbutrin  XL and Zoloft  as well as Abilify .   # (HFpEF) heart failure with preserved ejection fraction  continue Lasix  and Farxiga  as well as beta-blocker therapy.   # S/p total left knee replacement done on 9/8 Orthopedic consulted, does not feel any signs of infection, recommended to resume formal physical therapy while in the hospital, weightbearing as tolerated on the left leg. She also was advised to ask one of her family members to bring in her Polar Care device so that ice therapy may be continued during this hospitalization.   Continue PT and OT   There is no height or weight on file to calculate BMI.  Interventions:  Diet: Carb modified and heart healthy diet DVT Prophylaxis: Subcutaneous Lovenox    Advance goals of care discussion: Full code  Family Communication: family was not present at bedside, at the time of interview.  The pt provided permission to discuss medical plan with the family. Opportunity was given to ask question and all questions were answered satisfactorily.   Disposition:  Pt is from home, admitted with sepsis due to UTI, AKI, which resolved. stable to discharge to rehab. Discharge to  SNF, when bed would be available.  TOC consulted  Subjective: No significant events overnight, in the morning patient woke up and she was trying to work with physical therapy, complaining of right hip pain.  She is thinking that the UTI might be coming back.  Left knee pain is well-controlled. Still she needs rehab.   Physical Exam: General: NAD, lying comfortably Appear in no distress, affect appropriate Eyes: PERRLA ENT: Oral Mucosa Clear, moist  Neck: no JVD,  Cardiovascular: S1 and S2 Present, no Murmur,  Respiratory: good respiratory effort, Bilateral Air entry equal and Decreased, no Crackles, no wheezes Abdomen: Bowel Sound present, Soft and no tenderness,  Skin: no rashes Extremities: s/p left TKR, swollen and tender, ?  Cellulitis, no significant pedal edema Neurologic: without any new focal findings Gait not checked due to patient safety concerns  Vitals:   05/24/24 1959 05/25/24 0320 05/25/24 0801 05/25/24 1458  BP: (!) 157/62 (!) 121/38 (!) 122/49 (!) 107/48  Pulse: 71 65 68 67  Resp: 20 20 18 18   Temp: 97.7 F (36.5 C) 97.8 F (36.6 C) 98.2 F (36.8 C) 98.1 F (36.7 C)  TempSrc: Oral Oral Oral Oral  SpO2: 98% 95% 97% 96%    Intake/Output Summary (Last 24 hours) at 05/25/2024 1502 Last data filed at 05/25/2024 0840 Gross per 24 hour  Intake 480 ml  Output 2650 ml  Net -2170 ml   There were no vitals filed for this visit.  Data Reviewed: I have personally reviewed and interpreted daily labs, tele strips, imagings as discussed above. I reviewed all nursing notes, pharmacy notes, vitals, pertinent old records I have discussed plan of care as described above with RN and patient/family.  CBC: Recent Labs  Lab 05/20/24 0619 05/22/24 1549 05/23/24 0751 05/24/24 0621 05/25/24 0517  WBC 12.3* 15.7* 9.8 8.4 8.0  HGB 11.1* 12.4 10.4* 10.1* 10.6*  HCT 33.6* 37.8 32.1* 29.8* 32.7*  MCV 90.8 89.6 90.4 88.4 88.1  PLT 173 251 192 189 210   Basic Metabolic  Panel: Recent Labs  Lab 05/20/24 0619 05/22/24 1549 05/23/24 0751 05/24/24 0621 05/25/24 0517  NA 140 137 142 140 137  K 4.6 4.2 3.7 3.5 3.8  CL 105 100 109 105 101  CO2 24 18* 26 23 25   GLUCOSE 202* 271* 100* 117* 164*  BUN 37* 24* 24* 29* 35*  CREATININE 1.42* 1.50* 1.25* 1.30* 1.28*  CALCIUM  8.9 9.9 9.2 9.0 9.3  MG  --   --  2.0 1.9 2.0  PHOS  --   --  2.0* 3.8 3.8    Studies: No results found.   Scheduled Meds:  amLODipine   5 mg Oral Daily   ARIPiprazole   2 mg Oral QPM   ascorbic acid   500 mg Oral BID   aspirin  EC  81 mg Oral Daily   buPROPion   150 mg Oral Daily   cholecalciferol   2,000 Units Oral BID   dapagliflozin  propanediol  10 mg Oral q morning   docusate sodium   100  mg Oral BID   enoxaparin  (LOVENOX ) injection  0.5 mg/kg Subcutaneous Q24H   ezetimibe   10 mg Oral Daily   [START ON 05/27/2024] furosemide   20 mg Oral Daily   icosapent  Ethyl  2 g Oral BID   insulin  aspart  0-20 Units Subcutaneous TID WC   insulin  glargine  20 Units Subcutaneous Q24H   metoprolol  tartrate  25 mg Oral BID   mupirocin  ointment  1 Application Nasal BID   pregabalin   150 mg Oral BID   rosuvastatin   40 mg Oral Daily   sertraline   150 mg Oral Daily   traZODone   150 mg Oral QHS   Continuous Infusions:   PRN Meds: acetaminophen  **OR** acetaminophen , magnesium  hydroxide, ondansetron  **OR** ondansetron  (ZOFRAN ) IV, oxyCODONE   Time spent: 40 minutes  Author: ELVAN SOR. MD [facility] 05/25/2024 3:02 PM  To reach On-call, see care teams to locate the attending and reach out to them via [email or web address]. If 7PM-7AM, please contact night-coverage If you still have difficulty reaching the attending provider, please page the [facility] (Director on Call) for [facility] on amion for assistance.

## 2024-05-25 NOTE — Progress Notes (Signed)
 Physical Therapy Treatment Patient Details Name: Summer [name] MRN: [id number] DOB: 1951-02-13 Today's Date: 05/25/2024   History of Present Illness Summer [name] is a 73 y.o. female with multiple medical problems including coronary artery disease, congestive heart failure, hyperlipidemia, hypertension, hypothyroidism, obesity, COPD, diabetes, chronic kidney disease, and sleep apnea, now status post a left total knee arthroplasty on 05/19/24 who presents to the ED 4 days post op with UTI.    PT Comments  Pt in bed.  Reports R side/hip soreness and L knee from TKR.  Participated in exercises as described below. She gets to EOB with rails and increased time.  Steady in sitting  She stands with min a x 1 and is able to takes some steps today to recliner at bedside with min a x 1.  She does not feel like she can walk further at this time but does remain in chair.  Inc ROM noted L knee today and education provided.    Pt does have a significant decline in admission since her discharge Tuesday post op.  She is open and wishes from rehab upon discharge.     If plan is discharge home, recommend the following: A little help with bathing/dressing/bathroom;Assistance with cooking/housework;Assist for transportation;Help with stairs or ramp for entrance;A lot of help with walking and/or transfers   Can travel by private vehicle        Equipment Recommendations  Rolling walker (2 wheels)    Recommendations for Other Services       Precautions / Restrictions Precautions Precautions: Knee Recall of Precautions/Restrictions: Intact Restrictions Weight Bearing Restrictions Per Provider Order: Yes LLE Weight Bearing Per Provider Order: Weight bearing as tolerated     Mobility  Bed Mobility Overal bed mobility: Needs Assistance Bed Mobility: Supine to Sit     Supine to sit: Min assist       Patient Response: Cooperative  Transfers Overall transfer level: Needs assistance Equipment used:  Rolling walker (2 wheels) Transfers: Sit to/from Stand Sit to Stand: Min assist                Ambulation/Gait Ambulation/Gait assistance: Min Chemical engineer (Feet): 4 Feet Assistive device: Rolling walker (2 wheels) Gait Pattern/deviations: Step-through pattern, Decreased step length - right, Decreased step length - left Gait velocity: dec     General Gait Details: limited due to general weakness reported by pt.  pt did not feel she could do more.   Stairs             Wheelchair Mobility     Tilt Bed Tilt Bed Patient Response: Cooperative  Modified Rankin (Stroke Patients Only)       Balance Overall balance assessment: Needs assistance Sitting-balance support: No upper extremity supported, Feet supported Sitting balance-Leahy Scale: Good     Standing balance support: Bilateral upper extremity supported Standing balance-Leahy Scale: Fair Standing balance comment: reliant on RW for support                            Communication Communication Communication: No apparent difficulties  Cognition Arousal: Alert Behavior During Therapy: WFL for tasks assessed/performed   PT - Cognitive impairments: No apparent impairments                         Following commands: Intact      Cueing Cueing Techniques: Verbal cues, Gestural cues, Tactile cues  Exercises Total Joint Exercises  Goniometric ROM: 4-90 Other Exercises Other Exercises: LLE TKR ex in supine and sitting    General Comments        Pertinent Vitals/Pain Pain Assessment Pain Assessment: Faces Faces Pain Scale: Hurts little more Pain Location: L knee Pain Intervention(s): Limited activity within patient's tolerance, Monitored during session, Repositioned    Home Living                          Prior Function            PT Goals (current goals can now be found in the care plan section) Progress towards PT goals: Progressing toward goals     Frequency    7X/week      PT Plan      Co-evaluation              AM-PAC PT 6 Clicks Mobility   Outcome Measure  Help needed turning from your back to your side while in a flat bed without using bedrails?: None Help needed moving from lying on your back to sitting on the side of a flat bed without using bedrails?: A Little Help needed moving to and from a bed to a chair (including a wheelchair)?: A Little Help needed standing up from a chair using your arms (e.g., wheelchair or bedside chair)?: A Little Help needed to walk in hospital room?: A Little Help needed climbing 3-5 steps with a railing? : A Lot 6 Click Score: 18    End of Session Equipment Utilized During Treatment: Gait belt Activity Tolerance: Patient tolerated treatment well Patient left: in chair;with call bell/phone within reach;with chair alarm set Nurse Communication: Mobility status PT Visit Diagnosis: Muscle weakness (generalized) (M62.81);Other abnormalities of gait and mobility (R26.89);Difficulty in walking, not elsewhere classified (R26.2);Pain Pain - Right/Left: Left Pain - part of body: Knee     Time: 8967-8950 PT Time Calculation (min) (ACUTE ONLY): 17 min  Charges:    $Therapeutic Activity: 8-22 mins PT General Charges $$ ACUTE PT VISIT: 1 Visit                   Lauraine Gills, PTA 05/25/24, 12:26 PM

## 2024-05-25 NOTE — Plan of Care (Signed)
  Problem: Fluid Volume: Goal: Hemodynamic stability will improve Outcome: Progressing   Problem: Clinical Measurements: Goal: Diagnostic test results will improve Outcome: Progressing Goal: Signs and symptoms of infection will decrease Outcome: Progressing   Problem: Respiratory: Goal: Ability to maintain adequate ventilation will improve Outcome: Progressing   Problem: Education: Goal: Ability to describe self-care measures that may prevent or decrease complications (Diabetes Survival Skills Education) will improve Outcome: Progressing Goal: Individualized Educational Video(s) Outcome: Progressing

## 2024-05-26 DIAGNOSIS — A4150 Gram-negative sepsis, unspecified: Secondary | ICD-10-CM | POA: Diagnosis not present

## 2024-05-26 DIAGNOSIS — N390 Urinary tract infection, site not specified: Secondary | ICD-10-CM | POA: Diagnosis not present

## 2024-05-26 LAB — BASIC METABOLIC PANEL WITH GFR
Anion gap: 9 (ref 5–15)
BUN: 39 mg/dL — ABNORMAL HIGH (ref 8–23)
CO2: 27 mmol/L (ref 22–32)
Calcium: 9.4 mg/dL (ref 8.9–10.3)
Chloride: 103 mmol/L (ref 98–111)
Creatinine, Ser: 1.54 mg/dL — ABNORMAL HIGH (ref 0.44–1.00)
GFR, Estimated: 35 mL/min — ABNORMAL LOW (ref >60)
Glucose, Bld: 170 mg/dL — ABNORMAL HIGH (ref 70–99)
Potassium: 4.3 mmol/L (ref 3.5–5.1)
Sodium: 139 mmol/L (ref 135–145)

## 2024-05-26 LAB — PHOSPHORUS: Phosphorus: 3.8 mg/dL (ref 2.5–4.6)

## 2024-05-26 LAB — CBC
HCT: 32.7 % — ABNORMAL LOW (ref 36.0–46.0)
Hemoglobin: 10.6 g/dL — ABNORMAL LOW (ref 12.0–15.0)
MCH: 29.3 pg (ref 26.0–34.0)
MCHC: 32.4 g/dL (ref 30.0–36.0)
MCV: 90.3 fL (ref 80.0–100.0)
Platelets: 231 K/uL (ref 150–400)
RBC: 3.62 MIL/uL — ABNORMAL LOW (ref 3.87–5.11)
RDW: 14.1 % (ref 11.5–15.5)
WBC: 9.3 K/uL (ref 4.0–10.5)
nRBC: 0 % (ref 0.0–0.2)

## 2024-05-26 LAB — MAGNESIUM: Magnesium: 2.5 mg/dL — ABNORMAL HIGH (ref 1.7–2.4)

## 2024-05-26 LAB — GLUCOSE, CAPILLARY
Glucose-Capillary: 186 mg/dL — ABNORMAL HIGH (ref 70–99)
Glucose-Capillary: 246 mg/dL — ABNORMAL HIGH (ref 70–99)
Glucose-Capillary: 202 mg/dL — ABNORMAL HIGH (ref 70–99)
Glucose-Capillary: 198 mg/dL — ABNORMAL HIGH (ref 70–99)

## 2024-05-26 MED ORDER — METOPROLOL TARTRATE 25 MG PO TABS
12.5000 mg | ORAL_TABLET | Freq: Two times a day (BID) | ORAL | Status: DC
Start: 2024-05-27 — End: 2024-05-31
  Administered 2024-05-27 – 2024-05-31 (×9): 12.5 mg via ORAL
  Filled 2024-05-26 (×9): qty 1

## 2024-05-26 NOTE — TOC Progression Note (Addendum)
 Transition of Care [facility]) - Progression Note    Patient Details  Name: Summer [name] MRN: [id number] Date of Birth: [birth year]  Transition of Care [facility] CM/SW Contact  Corean ONEIDA Haddock, RN Phone Number: 05/26/2024, 1:43 PM  Clinical Narrative:     Met with patient at bedside. Currently no SNF bed offers in [facility] Patient declined for bed search to be extended.  Patient states that she prefers home with home health.  Patient states that she does not have a preference of agency.  Referral made to [facility] with [facility].  Start of care 9/22, per shelia it takes 5 days for auth.  Patient confirms she has a RW in the home, and request a BSC.   Referral made to [facility] with [facility].  Patient states that her significant other will transport at discharge                     Expected Discharge Plan and Services                                               Social Drivers of Health (SDOH) Interventions SDOH Screenings   Food Insecurity: No Food Insecurity (05/23/2024)  Housing: Unknown (05/23/2024)  Transportation Needs: No Transportation Needs (05/23/2024)  Utilities: Not At Risk (05/23/2024)  Alcohol Screen: Low Risk  (08/30/2021)  Depression (PHQ2-9): Medium Risk (04/04/2024)  Financial Resource Strain: Low Risk  (05/14/2024)   Received from [facility]  Physical Activity: Inactive (08/30/2021)  Social Connections: Moderately Isolated (05/23/2024)  Stress: No Stress Concern Present (08/30/2021)  Tobacco Use: Low Risk  (05/19/2024)    Readmission Risk Interventions    05/23/2024    4:54 PM 05/02/2023    2:39 PM  Readmission Risk Prevention Plan  Transportation Screening Complete Complete  PCP or Specialist Appt within 3-5 Days Complete Complete  HRI or Home Care Consult  Complete  Social Work Consult for Recovery Care Planning/Counseling Complete Complete  Palliative Care Screening Not Applicable Not Applicable  Medication Review Furniture conservator/restorer) Complete Referral to Pharmacy

## 2024-05-26 NOTE — Plan of Care (Signed)

## 2024-05-26 NOTE — Progress Notes (Signed)
 Physical Therapy Treatment Patient Details Name: Summer [name] MRN: [id number] DOB: 12/02/1950 Today's Date: 05/26/2024   History of Present Illness Summer [name] is a 73 y.o. female with multiple medical problems including coronary artery disease, congestive heart failure, hyperlipidemia, hypertension, hypothyroidism, obesity, COPD, diabetes, chronic kidney disease, and sleep apnea, now status post a left total knee arthroplasty on 05/19/24 who presents to the ED 4 days post op with UTI.    PT Comments  Pt ready for session.  She is able to get to EOB with rails and supervision.  Stands with min a x 1 and hesitantly steps to recliner.  Stated she feels L knee is weak and will buckle.  KI is obtained to allow for comfort and safe gait progression.  She is able to walk to door, seated rest, to bathroom to void, then back to recliner with effortful gait.  KI removed in chair and reviewed it is to allow her to walk with increased confidence and safety in therapy sessions.  Recommended BSC or if she does walk to bathroom with nursing staff to don KI.  Voiced understanding.    <3 hrs of therapy at discharge remains recommended.    Pt stated she has not had BM since before TKR surgery.  RN notified.     If plan is discharge home, recommend the following: A little help with bathing/dressing/bathroom;Assistance with cooking/housework;Assist for transportation;Help with stairs or ramp for entrance;A lot of help with walking and/or transfers   Can travel by private vehicle        Equipment Recommendations       Recommendations for Other Services       Precautions / Restrictions Precautions Precautions: Knee Recall of Precautions/Restrictions: Intact Restrictions Weight Bearing Restrictions Per Provider Order: Yes LLE Weight Bearing Per Provider Order: Weight bearing as tolerated     Mobility  Bed Mobility Overal bed mobility: Needs Assistance Bed Mobility: Supine to Sit     Supine to sit:  Modified independent (Device/Increase time), Used rails       Patient Response: Cooperative  Transfers Overall transfer level: Needs assistance Equipment used: Rolling walker (2 wheels) Transfers: Sit to/from Stand Sit to Stand: Min assist           General transfer comment: steps to chair but stated she feels L knee will buckle    Ambulation/Gait Ambulation/Gait assistance: Min assist Gait Distance (Feet): 20 Feet Assistive device: Rolling walker (2 wheels) Gait Pattern/deviations: Step-through pattern, Decreased step length - right, Decreased step length - left Gait velocity: dec     General Gait Details: 20' x 2 with RW   Stairs             Wheelchair Mobility     Tilt Bed Tilt Bed Patient Response: Cooperative  Modified Rankin (Stroke Patients Only)       Balance Overall balance assessment: Needs assistance Sitting-balance support: No upper extremity supported, Feet supported Sitting balance-Leahy Scale: Good     Standing balance support: Bilateral upper extremity supported Standing balance-Leahy Scale: Fair Standing balance comment: reliant on RW for support                            Communication Communication Communication: No apparent difficulties  Cognition Arousal: Alert Behavior During Therapy: WFL for tasks assessed/performed   PT - Cognitive impairments: No apparent impairments  Following commands: Intact      Cueing Cueing Techniques: Verbal cues, Gestural cues, Tactile cues  Exercises Other Exercises Other Exercises: LLE TKR ex in sitting, stretch    General Comments        Pertinent Vitals/Pain Pain Assessment Pain Assessment: Faces Faces Pain Scale: Hurts little more Pain Location: L knee Pain Descriptors / Indicators: Sore, Aching Pain Intervention(s): Limited activity within patient's tolerance, Monitored during session, Repositioned    Home Living                           Prior Function            PT Goals (current goals can now be found in the care plan section) Progress towards PT goals: Progressing toward goals    Frequency    7X/week      PT Plan      Co-evaluation              AM-PAC PT 6 Clicks Mobility   Outcome Measure  Help needed turning from your back to your side while in a flat bed without using bedrails?: None Help needed moving from lying on your back to sitting on the side of a flat bed without using bedrails?: None Help needed moving to and from a bed to a chair (including a wheelchair)?: A Little Help needed standing up from a chair using your arms (e.g., wheelchair or bedside chair)?: A Little Help needed to walk in hospital room?: A Little Help needed climbing 3-5 steps with a railing? : A Lot 6 Click Score: 19    End of Session Equipment Utilized During Treatment: Gait belt Activity Tolerance: Patient tolerated treatment well Patient left: in chair;with call bell/phone within reach;with chair alarm set Nurse Communication: Mobility status PT Visit Diagnosis: Muscle weakness (generalized) (M62.81);Other abnormalities of gait and mobility (R26.89);Difficulty in walking, not elsewhere classified (R26.2);Pain Pain - Right/Left: Left Pain - part of body: Knee     Time: 8944-8878 PT Time Calculation (min) (ACUTE ONLY): 26 min  Charges:    $Gait Training: 8-22 mins $Therapeutic Exercise: 8-22 mins PT General Charges $$ ACUTE PT VISIT: 1 Visit                    Lauraine Gills, PTA 05/26/24, 12:08 PM

## 2024-05-26 NOTE — Progress Notes (Signed)
 Patient is not able to walk the distance required to go the bathroom, or he/she is unable to safely negotiate stairs required to access the bathroom.  A 3in1 BSC will alleviate this problem

## 2024-05-26 NOTE — Progress Notes (Signed)
 [facility] Progress Note  Patient: Summer [name]    FMW:[id number]  DOA: 05/22/2024     Date of Service: the patient was seen and examined on 05/26/2024  Chief Complaint  Patient presents with   Abdominal Pain   Knee Pain   Brief hospital course:  Summer [name] is a 73 y.o. Caucasian female with medical history significant for HFpEF, asthma, COPD, coronary artery disease, stage IV CKD, type 2 diabetes mellitus and PAD, depression and GERD, who presented to the emergency room with acute onset of altered mental status with confusion.  She admits to urinary frequency, urgency, dysuria without hematuria or flank pain.  She has been having chills but did not report any measured fever.  She had a left total knee arthroplasty on 05/19/2024 and has been taking pain medications since.  She has been very confused over the last couple of days.  She admitted to nausea without vomiting.  She admitted to mild lower abdominal discomfort.  No chest pain or palpitations.  No cough or wheezing or dyspnea.   ED Course: When the patient came to the ER, BP was 139/109 and heart rate was 120 with otherwise normal vital signs.  Labs revealed hyperglycemia of 271 and a CO2 of 18 with a anion gap of 19 however VBG showed HCO3 of 23 and pH of 7.39.  Total bili was 1.5 and LFTs were otherwise normal.  Lactic acid was 1.6.  CBC showed leukocytosis of 15.7.  UA came back positive for UTI.  Urine culture was sent as well as 2 blood cultures. EKG as reviewed by me : None. Imaging: None.   The patient was given 2 L bolus of IV normal saline and 8 units of IV NovoLog , 2 g of IV Rocephin  and will be admitted to a medical telemetry bed for further evaluation and management.    Assessment and Plan:  # Sepsis due to UTI  sepsis is manifested by tachycardia, tachypnea and leukocytosis. - continue IV Rocephin  x 3 days till 9/13 - blood culture NGTD and urine cultures grew multiple species, recommended recollection - s/p IV  fluid for hydration  US  renal: No hydronephrosis or nephrolithiasis. Hepatic steatosis.   # AKI on CKD stage IIIa Baseline creatinine sCr 1.42-21.5--1.25>>1.28 S/p IV fluid for hydration  # Hypophosphatemia due to nutritional deficiency Phos repleted orally. Check electrolytes daily and replete as needed.   # Uncontrolled type 2 diabetes mellitus with hyperglycemia, with long-term current use of insulin : - The patient will be placed on supplemental coverage with NovoLog . - continue basal coverage. - continue Lyrica  for diabetic neuropathy.   # Dyslipidemia: continue statin and Zetia .   # Essential hypertension 9/15 blood pressure soft, discontinued amlodipine  and decreased Lopressor  12.5 mg p.o. twice daily with holding parameter Monitor BP and titrate medications accordingly    # GERD without esophagitis:  continue PPI therapy.   # MDD (major depressive disorder) -continue Wellbutrin  XL and Zoloft  as well as Abilify .   # (HFpEF) heart failure with preserved ejection fraction  continue Lasix  and Farxiga  as well as beta-blocker therapy.   # S/p total left knee replacement done on 9/8 Orthopedic consulted, does not feel any signs of infection, recommended to resume formal physical therapy while in the hospital, weightbearing as tolerated on the left leg. She also was advised to ask one of her family members to bring in her Polar Care device so that ice therapy may be continued during this hospitalization.   Continue PT  and OT   There is no height or weight on file to calculate BMI.  Interventions:  Diet: Carb modified and heart healthy diet DVT Prophylaxis: Subcutaneous Lovenox    Advance goals of care discussion: Full code  Family Communication: family was not present at bedside, at the time of interview.  The pt provided permission to discuss medical plan with the family. Opportunity was given to ask question and all questions were answered satisfactorily.    Disposition:  Pt is from home, admitted with sepsis due to UTI, AKI, which resolved. stable to discharge to rehab. Discharge to SNF, when bed would be available.  TOC consulted 9/15 DC home tomorrow a.m. if ambulating well today.  Patient does not want to go to significant increase tomorrow.  As per TOC    Subjective: No significant events overnight, patient was laying comfortably in the bed, denied any active issues, pain is 3/10, still needs help with ambulation, patient was considering SNF placement.   Physical Exam: General: NAD, lying comfortably Appear in no distress, affect appropriate Eyes: PERRLA ENT: Oral Mucosa Clear, moist  Neck: no JVD,  Cardiovascular: S1 and S2 Present, no Murmur,  Respiratory: good respiratory effort, Bilateral Air entry equal and Decreased, no Crackles, no wheezes Abdomen: Bowel Sound present, Soft and no tenderness,  Skin: no rashes Extremities: s/p left TKR, mild swollen & tender, dressing CDI, no significant pedal edema Neurologic: without any new focal findings Gait not checked due to patient safety concerns  Vitals:   05/25/24 1458 05/25/24 2113 05/26/24 0357 05/26/24 0834  BP: (!) 107/48 (!) 128/36 (!) 130/45 (!) 109/31  Pulse: 67 68 62 66  Resp: 18 16 18 19   Temp: 98.1 F (36.7 C) 98 F (36.7 C) (!) 97.5 F (36.4 C) 97.7 F (36.5 C)  TempSrc: Oral Oral Oral   SpO2: 96% 98% 98% 96%    Intake/Output Summary (Last 24 hours) at 05/26/2024 1450 Last data filed at 05/26/2024 1300 Gross per 24 hour  Intake 720 ml  Output 1700 ml  Net -980 ml   There were no vitals filed for this visit.  Data Reviewed: I have personally reviewed and interpreted daily labs, tele strips, imagings as discussed above. I reviewed all nursing notes, pharmacy notes, vitals, pertinent old records I have discussed plan of care as described above with RN and patient/family.  CBC: Recent Labs  Lab 05/22/24 1549 05/23/24 0751 05/24/24 0621 05/25/24 0517  05/26/24 0442  WBC 15.7* 9.8 8.4 8.0 9.3  HGB 12.4 10.4* 10.1* 10.6* 10.6*  HCT 37.8 32.1* 29.8* 32.7* 32.7*  MCV 89.6 90.4 88.4 88.1 90.3  PLT 251 192 189 210 231   Basic Metabolic Panel: Recent Labs  Lab 05/22/24 1549 05/23/24 0751 05/24/24 0621 05/25/24 0517 05/26/24 0442  NA 137 142 140 137 139  K 4.2 3.7 3.5 3.8 4.3  CL 100 109 105 101 103  CO2 18* 26 23 25 27   GLUCOSE 271* 100* 117* 164* 170*  BUN 24* 24* 29* 35* 39*  CREATININE 1.50* 1.25* 1.30* 1.28* 1.54*  CALCIUM  9.9 9.2 9.0 9.3 9.4  MG  --  2.0 1.9 2.0 2.5*  PHOS  --  2.0* 3.8 3.8 3.8    Studies: No results found.   Scheduled Meds:  ARIPiprazole   2 mg Oral QPM   ascorbic acid   500 mg Oral BID   aspirin  EC  81 mg Oral Daily   buPROPion   150 mg Oral Daily   cholecalciferol   2,000 Units Oral BID  dapagliflozin  propanediol  10 mg Oral q morning   docusate sodium   100 mg Oral BID   enoxaparin  (LOVENOX ) injection  0.5 mg/kg Subcutaneous Q24H   ezetimibe   10 mg Oral Daily   [START ON 05/27/2024] furosemide   20 mg Oral Daily   icosapent  Ethyl  2 g Oral BID   insulin  aspart  0-20 Units Subcutaneous TID WC   insulin  glargine  20 Units Subcutaneous Q24H   [START ON 05/27/2024] metoprolol  tartrate  12.5 mg Oral BID   mupirocin  ointment  1 Application Nasal BID   pregabalin   150 mg Oral BID   rosuvastatin   40 mg Oral Daily   sertraline   150 mg Oral Daily   traZODone   150 mg Oral QHS   Continuous Infusions:   PRN Meds: acetaminophen  **OR** acetaminophen , magnesium  hydroxide, ondansetron  **OR** ondansetron  (ZOFRAN ) IV, oxyCODONE   Time spent: 40 minutes  Author: ELVAN SOR. MD [facility] 05/26/2024 2:50 PM  To reach On-call, see care teams to locate the attending and reach out to them via [email or web address]. If 7PM-7AM, please contact night-coverage If you still have difficulty reaching the attending provider, please page the [facility] (Director on Call) for [facility] on amion for assistance.

## 2024-05-27 DIAGNOSIS — A4150 Gram-negative sepsis, unspecified: Secondary | ICD-10-CM | POA: Diagnosis not present

## 2024-05-27 DIAGNOSIS — N390 Urinary tract infection, site not specified: Secondary | ICD-10-CM | POA: Diagnosis not present

## 2024-05-27 LAB — CBC
HCT: 32.1 % — ABNORMAL LOW (ref 36.0–46.0)
Hemoglobin: 10.7 g/dL — ABNORMAL LOW (ref 12.0–15.0)
MCH: 29.6 pg (ref 26.0–34.0)
MCHC: 33.3 g/dL (ref 30.0–36.0)
MCV: 88.7 fL (ref 80.0–100.0)
Platelets: 256 K/uL (ref 150–400)
RBC: 3.62 MIL/uL — ABNORMAL LOW (ref 3.87–5.11)
RDW: 14 % (ref 11.5–15.5)
WBC: 11.6 K/uL — ABNORMAL HIGH (ref 4.0–10.5)
nRBC: 0 % (ref 0.0–0.2)

## 2024-05-27 LAB — BASIC METABOLIC PANEL WITH GFR
Anion gap: 10 (ref 5–15)
BUN: 41 mg/dL — ABNORMAL HIGH (ref 8–23)
CO2: 24 mmol/L (ref 22–32)
Calcium: 9.3 mg/dL (ref 8.9–10.3)
Chloride: 103 mmol/L (ref 98–111)
Creatinine, Ser: 1.44 mg/dL — ABNORMAL HIGH (ref 0.44–1.00)
GFR, Estimated: 38 mL/min — ABNORMAL LOW (ref >60)
Glucose, Bld: 192 mg/dL — ABNORMAL HIGH (ref 70–99)
Potassium: 4.3 mmol/L (ref 3.5–5.1)
Sodium: 137 mmol/L (ref 135–145)

## 2024-05-27 LAB — GLUCOSE, CAPILLARY
Glucose-Capillary: 189 mg/dL — ABNORMAL HIGH (ref 70–99)
Glucose-Capillary: 184 mg/dL — ABNORMAL HIGH (ref 70–99)
Glucose-Capillary: 252 mg/dL — ABNORMAL HIGH (ref 70–99)
Glucose-Capillary: 221 mg/dL — ABNORMAL HIGH (ref 70–99)
Glucose-Capillary: 231 mg/dL — ABNORMAL HIGH (ref 70–99)

## 2024-05-27 LAB — CULTURE, BLOOD (ROUTINE X 2)
Culture: NO GROWTH
Culture: NO GROWTH
Special Requests: ADEQUATE

## 2024-05-27 MED ORDER — MINERAL OIL RE ENEM
1.0000 | ENEMA | Freq: Once | RECTAL | Status: AC
  Administered 2024-05-27: 1 via RECTAL

## 2024-05-27 MED ORDER — BISACODYL 5 MG PO TBEC: 10.0000 mg | DELAYED_RELEASE_TABLET | Freq: Every day | ORAL | 0 refills | Status: DC

## 2024-05-27 MED ORDER — POLYETHYLENE GLYCOL 3350 17 G PO PACK: 17.0000 g | PACK | Freq: Two times a day (BID) | ORAL | 0 refills | Status: DC

## 2024-05-27 MED ORDER — POLYETHYLENE GLYCOL 3350 17 G PO PACK
17.0000 g | PACK | Freq: Two times a day (BID) | ORAL | Status: DC
  Administered 2024-05-27 – 2024-05-28 (×3): 17 g via ORAL
  Filled 2024-05-27 (×3): qty 1

## 2024-05-27 MED ORDER — BISACODYL 5 MG PO TBEC
10.0000 mg | DELAYED_RELEASE_TABLET | Freq: Every day | ORAL | Status: DC
  Administered 2024-05-27 – 2024-05-28 (×2): 10 mg via ORAL
  Filled 2024-05-27 (×4): qty 2

## 2024-05-27 MED ORDER — BISACODYL 10 MG RE SUPP
10.0000 mg | Freq: Once | RECTAL | Status: AC
  Administered 2024-05-27: 10 mg via RECTAL
  Filled 2024-05-27: qty 1

## 2024-05-27 NOTE — Progress Notes (Signed)
 Patient will not be discharging tonight. Patient will be leaving in the morning. Dr. Von notified.

## 2024-05-27 NOTE — Plan of Care (Signed)

## 2024-05-27 NOTE — Progress Notes (Signed)
 Physical Therapy Treatment Patient Details Name: Summer [name] MRN: [id number] DOB: 1951-04-06 Today's Date: 05/27/2024   History of Present Illness Summer [name] is a 73 y.o. female with multiple medical problems including coronary artery disease, congestive heart failure, hyperlipidemia, hypertension, hypothyroidism, obesity, COPD, diabetes, chronic kidney disease, and sleep apnea, now status post a left total knee arthroplasty on 05/19/24 who presents to the ED 4 days post op with UTI.    PT Comments  Pt ready for session.  Able to get to EOB with assist to don KI.  She stated she got up with nursing earlier and still feels she needs KI for gait.  She is able to walk 20' before fatigue then rest in chair before going to commode.  RN in to give suppository and left with nursing care.  Discussed pt with ortho PA gains and OK'ed to use KI for gait at home.  Stressed to pt she is to use KI for gait only and to continue to flex and ex L knee daily.  Voiced understanding.  Overall continues to remain limited in mobility and ROM but does opt to return home vs SNF at this time.     If plan is discharge home, recommend the following: A little help with bathing/dressing/bathroom;Assistance with cooking/housework;Assist for transportation;Help with stairs or ramp for entrance;A lot of help with walking and/or transfers   Can travel by private vehicle        Equipment Recommendations  Rolling walker (2 wheels)    Recommendations for Other Services       Precautions / Restrictions Precautions Precautions: Knee Recall of Precautions/Restrictions: Intact Restrictions Weight Bearing Restrictions Per Provider Order: Yes LLE Weight Bearing Per Provider Order: Weight bearing as tolerated     Mobility  Bed Mobility Overal bed mobility: Needs Assistance Bed Mobility: Supine to Sit     Supine to sit: Modified independent (Device/Increase time), Used rails       Patient Response:  Cooperative  Transfers Overall transfer level: Needs assistance Equipment used: Rolling walker (2 wheels) Transfers: Sit to/from Stand Sit to Stand: Min assist           General transfer comment: steps to chair but stated she feels L knee will buckle    Ambulation/Gait Ambulation/Gait assistance: Min assist Gait Distance (Feet): 20 Feet Assistive device: Rolling walker (2 wheels) Gait Pattern/deviations: Step-through pattern, Decreased step length - right, Decreased step length - left Gait velocity: dec     General Gait Details: 20' x 2 with RW   Stairs             Wheelchair Mobility     Tilt Bed Tilt Bed Patient Response: Cooperative  Modified Rankin (Stroke Patients Only)       Balance Overall balance assessment: Needs assistance Sitting-balance support: No upper extremity supported, Feet supported Sitting balance-Leahy Scale: Good Sitting balance - Comments: steady static sitting, reaching inside BOS.   Standing balance support: Bilateral upper extremity supported Standing balance-Leahy Scale: Fair Standing balance comment: reliant on RW for support                            Communication Communication Communication: No apparent difficulties  Cognition Arousal: Alert Behavior During Therapy: WFL for tasks assessed/performed   PT - Cognitive impairments: No apparent impairments                         Following  commands: Intact      Cueing Cueing Techniques: Verbal cues, Gestural cues, Tactile cues  Exercises      General Comments        Pertinent Vitals/Pain Pain Assessment Pain Assessment: Faces Faces Pain Scale: Hurts little more Pain Location: L knee Pain Descriptors / Indicators: Sore, Aching Pain Intervention(s): Limited activity within patient's tolerance, Monitored during session, Repositioned    Home Living                          Prior Function            PT Goals (current goals  can now be found in the care plan section) Progress towards PT goals: Progressing toward goals    Frequency    7X/week      PT Plan      Co-evaluation              AM-PAC PT 6 Clicks Mobility   Outcome Measure  Help needed turning from your back to your side while in a flat bed without using bedrails?: None Help needed moving from lying on your back to sitting on the side of a flat bed without using bedrails?: None Help needed moving to and from a bed to a chair (including a wheelchair)?: A Little Help needed standing up from a chair using your arms (e.g., wheelchair or bedside chair)?: A Little Help needed to walk in hospital room?: A Little Help needed climbing 3-5 steps with a railing? : A Lot 6 Click Score: 19    End of Session Equipment Utilized During Treatment: Gait belt Activity Tolerance: Patient tolerated treatment well Patient left: with nursing/sitter in room (on [facility] for suppository) Nurse Communication: Mobility status PT Visit Diagnosis: Muscle weakness (generalized) (M62.81);Other abnormalities of gait and mobility (R26.89);Difficulty in walking, not elsewhere classified (R26.2);Pain Pain - Right/Left: Left Pain - part of body: Knee     Time: 8867-8854 PT Time Calculation (min) (ACUTE ONLY): 13 min  Charges:    $Gait Training: 8-22 mins PT General Charges $$ ACUTE PT VISIT: 1 Visit                    Lauraine Gills, PTA 05/27/24, 11:58 AM

## 2024-05-27 NOTE — TOC Transition Note (Signed)
 Transition of Care [facility]) - Discharge Note   Patient Details  Name: Summer [name] MRN: [id number] Date of Birth: 07/08/1951  [facility] CM/SW Contact:  Corean ONEIDA Haddock, RN Phone Number: 05/27/2024, 1:39 PM   Clinical Narrative:     Patient to discharge today [name] with [facility] notified of discharge         Patient Goals and CMS Choice            Discharge Placement                       Discharge Plan and Services Additional resources added to the After Visit Summary for                                       Social Drivers of Health (SDOH) Interventions SDOH Screenings   Food Insecurity: No Food Insecurity (05/23/2024)  Housing: Unknown (05/23/2024)  Transportation Needs: No Transportation Needs (05/23/2024)  Utilities: Not At Risk (05/23/2024)  Alcohol Screen: Low Risk  (08/30/2021)  Depression (PHQ2-9): Medium Risk (04/04/2024)  Financial Resource Strain: Low Risk  (05/14/2024)   Received from [facility]  Physical Activity: Inactive (08/30/2021)  Social Connections: Moderately Isolated (05/23/2024)  Stress: No Stress Concern Present (08/30/2021)  Tobacco Use: Low Risk  (05/19/2024)     Readmission Risk Interventions    05/23/2024    4:54 PM 05/02/2023    2:39 PM  Readmission Risk Prevention Plan  Transportation Screening Complete Complete  PCP or Specialist Appt within 3-5 Days Complete Complete  HRI or Home Care Consult  Complete  Social Work Consult for Recovery Care Planning/Counseling Complete Complete  Palliative Care Screening Not Applicable Not Applicable  Medication Review Oceanographer) Complete Referral to Pharmacy

## 2024-05-27 NOTE — Progress Notes (Signed)
 [facility] Progress Note  Patient: Summer [name]    FMW:[id number]  DOA: 05/22/2024     Date of Service: the patient was seen and examined on 05/27/2024  Chief Complaint  Patient presents with   Abdominal Pain   Knee Pain   Brief hospital course:  Summer [name] is a 73 y.o. Caucasian female with medical history significant for HFpEF, asthma, COPD, coronary artery disease, stage IV CKD, type 2 diabetes mellitus and PAD, depression and GERD, who presented to the emergency room with acute onset of altered mental status with confusion.  She admits to urinary frequency, urgency, dysuria without hematuria or flank pain.  She has been having chills but did not report any measured fever.  She had a left total knee arthroplasty on 05/19/2024 and has been taking pain medications since.  She has been very confused over the last couple of days.  She admitted to nausea without vomiting.  She admitted to mild lower abdominal discomfort.  No chest pain or palpitations.  No cough or wheezing or dyspnea.   ED Course: When the patient came to the ER, BP was 139/109 and heart rate was 120 with otherwise normal vital signs.  Labs revealed hyperglycemia of 271 and a CO2 of 18 with a anion gap of 19 however VBG showed HCO3 of 23 and pH of 7.39.  Total bili was 1.5 and LFTs were otherwise normal.  Lactic acid was 1.6.  CBC showed leukocytosis of 15.7.  UA came back positive for UTI.  Urine culture was sent as well as 2 blood cultures. EKG as reviewed by me : None. Imaging: None.   The patient was given 2 L bolus of IV normal saline and 8 units of IV NovoLog , 2 g of IV Rocephin  and will be admitted to a medical telemetry bed for further evaluation and management.    Assessment and Plan:  # Sepsis due to UTI  sepsis is manifested by tachycardia, tachypnea and leukocytosis. - continue IV Rocephin  x 3 days till 9/13 - blood culture NGTD and urine cultures grew multiple species, recommended recollection - s/p IV  fluid for hydration  US  renal: No hydronephrosis or nephrolithiasis. Hepatic steatosis.   # AKI on CKD stage IIIa Baseline creatinine sCr 1.42-21.5--1.25>>1.28 S/p IV fluid for hydration  # Hypophosphatemia due to nutritional deficiency Phos repleted orally. Check electrolytes daily and replete as needed.   # Uncontrolled type 2 diabetes mellitus with hyperglycemia, with long-term current use of insulin : - The patient will be placed on supplemental coverage with NovoLog . - continue basal coverage. - continue Lyrica  for diabetic neuropathy.   # Dyslipidemia: continue statin and Zetia .   # Essential hypertension 9/15 blood pressure soft, discontinued amlodipine  and decreased Lopressor  12.5 mg p.o. twice daily with holding parameter Monitor BP and titrate medications accordingly    # GERD without esophagitis:  continue PPI therapy.   # MDD (major depressive disorder) -continue Wellbutrin  XL and Zoloft  as well as Abilify .   # (HFpEF) heart failure with preserved ejection fraction  continue Lasix  and Farxiga  as well as beta-blocker therapy.   # S/p total left knee replacement done on 9/8 Orthopedic consulted, does not feel any signs of infection, recommended to resume formal physical therapy while in the hospital, weightbearing as tolerated on the left leg. She also was advised to ask one of her family members to bring in her Polar Care device so that ice therapy may be continued during this hospitalization.   Continue PT  and OT   # Constipation 9/16 Started laxatives, suppository given but no BM show mineral oil enema was also given.   Finally patient had a BM but she was not feeling good to go home today so patient will be discharged home tomorrow a.m.   There is no height or weight on file to calculate BMI.  Interventions:  Diet: Carb modified and heart healthy diet DVT Prophylaxis: Subcutaneous Lovenox    Advance goals of care discussion: Full code  Family  Communication: family was not present at bedside, at the time of interview.  The pt provided permission to discuss medical plan with the family. Opportunity was given to ask question and all questions were answered satisfactorily.   Disposition:  Pt is from home, admitted with sepsis due to UTI, AKI, which resolved. stable to discharge to rehab. Discharge to SNF, when bed would be available.  TOC consulted 9/15 DC home tomorrow a.m. if ambulating well today.  Patient does not want to go to significant increase tomorrow.  As per TOC    Subjective: No significant events overnight, patient feeling better, pain is under control.  As per patient she did work with physical therapy and she seems to be improving so she is good to go home with physical therapy and does not want to go to rehab because it is too far increased work. Patient was requesting for some laxatives as she has not had a BM in 10 days.  Patient agreed for aggressive management, we will start with suppository.    Physical Exam: General: NAD, lying comfortably Appear in no distress, affect appropriate Eyes: PERRLA ENT: Oral Mucosa Clear, moist  Neck: no JVD,  Cardiovascular: S1 and S2 Present, no Murmur,  Respiratory: good respiratory effort, Bilateral Air entry equal and Decreased, no Crackles, no wheezes Abdomen: Bowel Sound present, Soft and no tenderness,  Skin: no rashes Extremities: s/p left TKR, mild swollen & tender, dressing CDI, no significant pedal edema Neurologic: without any new focal findings Gait not checked due to patient safety concerns  Vitals:   05/26/24 1936 05/27/24 0325 05/27/24 0754 05/27/24 1528  BP: (!) 113/43 (!) 140/51 (!) 141/49 (!) 141/74  Pulse: 69 67 68 72  Resp: 18 18 18 18   Temp: 98.6 F (37 C) 97.8 F (36.6 C) 98.4 F (36.9 C) 98.4 F (36.9 C)  TempSrc: Oral Oral Oral Oral  SpO2: 98% 100% 99% 100%    Intake/Output Summary (Last 24 hours) at 05/27/2024 1807 Last data filed at  05/27/2024 1124 Gross per 24 hour  Intake 240 ml  Output 950 ml  Net -710 ml   There were no vitals filed for this visit.  Data Reviewed: I have personally reviewed and interpreted daily labs, tele strips, imagings as discussed above. I reviewed all nursing notes, pharmacy notes, vitals, pertinent old records I have discussed plan of care as described above with RN and patient/family.  CBC: Recent Labs  Lab 05/23/24 0751 05/24/24 0621 05/25/24 0517 05/26/24 0442 05/27/24 0416  WBC 9.8 8.4 8.0 9.3 11.6*  HGB 10.4* 10.1* 10.6* 10.6* 10.7*  HCT 32.1* 29.8* 32.7* 32.7* 32.1*  MCV 90.4 88.4 88.1 90.3 88.7  PLT 192 189 210 231 256   Basic Metabolic Panel: Recent Labs  Lab 05/23/24 0751 05/24/24 0621 05/25/24 0517 05/26/24 0442 05/27/24 0416  NA 142 140 137 139 137  K 3.7 3.5 3.8 4.3 4.3  CL 109 105 101 103 103  CO2 26 23 25 27 24   GLUCOSE 100*  117* 164* 170* 192*  BUN 24* 29* 35* 39* 41*  CREATININE 1.25* 1.30* 1.28* 1.54* 1.44*  CALCIUM  9.2 9.0 9.3 9.4 9.3  MG 2.0 1.9 2.0 2.5*  --   PHOS 2.0* 3.8 3.8 3.8  --     Studies: No results found.   Scheduled Meds:  ARIPiprazole   2 mg Oral QPM   ascorbic acid   500 mg Oral BID   aspirin  EC  81 mg Oral Daily   bisacodyl   10 mg Oral QHS   buPROPion   150 mg Oral Daily   cholecalciferol   2,000 Units Oral BID   dapagliflozin  propanediol  10 mg Oral q morning   enoxaparin  (LOVENOX ) injection  0.5 mg/kg Subcutaneous Q24H   ezetimibe   10 mg Oral Daily   furosemide   20 mg Oral Daily   icosapent  Ethyl  2 g Oral BID   insulin  aspart  0-20 Units Subcutaneous TID WC   insulin  glargine  20 Units Subcutaneous Q24H   metoprolol  tartrate  12.5 mg Oral BID   mupirocin  ointment  1 Application Nasal BID   polyethylene glycol  17 g Oral BID   pregabalin   150 mg Oral BID   rosuvastatin   40 mg Oral Daily   sertraline   150 mg Oral Daily   traZODone   150 mg Oral QHS   Continuous Infusions:   PRN Meds: acetaminophen  **OR**  acetaminophen , magnesium  hydroxide, ondansetron  **OR** ondansetron  (ZOFRAN ) IV, oxyCODONE   Time spent: 40 minutes  Author: ELVAN SOR. MD [facility] 05/27/2024 6:07 PM  To reach On-call, see care teams to locate the attending and reach out to them via [email or web address]. If 7PM-7AM, please contact night-coverage If you still have difficulty reaching the attending provider, please page the [facility] (Director on Call) for [facility] on amion for assistance.

## 2024-05-27 NOTE — Progress Notes (Signed)
 Patient will be discharged as soon as she has bowel movement. Patient has been given suppository and enema at this time.

## 2024-05-28 ENCOUNTER — Observation Stay

## 2024-05-28 DIAGNOSIS — A4150 Gram-negative sepsis, unspecified: Secondary | ICD-10-CM | POA: Diagnosis not present

## 2024-05-28 DIAGNOSIS — N390 Urinary tract infection, site not specified: Secondary | ICD-10-CM | POA: Diagnosis not present

## 2024-05-28 LAB — BASIC METABOLIC PANEL WITH GFR
Anion gap: 14 (ref 5–15)
Anion gap: 13 (ref 5–15)
BUN: 41 mg/dL — ABNORMAL HIGH (ref 8–23)
BUN: 40 mg/dL — ABNORMAL HIGH (ref 8–23)
CO2: 26 mmol/L (ref 22–32)
CO2: 24 mmol/L (ref 22–32)
Calcium: 10.1 mg/dL (ref 8.9–10.3)
Calcium: 9.7 mg/dL (ref 8.9–10.3)
Chloride: 98 mmol/L (ref 98–111)
Chloride: 99 mmol/L (ref 98–111)
Creatinine, Ser: 1.64 mg/dL — ABNORMAL HIGH (ref 0.44–1.00)
Creatinine, Ser: 1.51 mg/dL — ABNORMAL HIGH (ref 0.44–1.00)
GFR, Estimated: 32 mL/min — ABNORMAL LOW (ref >60)
GFR, Estimated: 36 mL/min — ABNORMAL LOW (ref >60)
Glucose, Bld: 227 mg/dL — ABNORMAL HIGH (ref 70–99)
Glucose, Bld: 243 mg/dL — ABNORMAL HIGH (ref 70–99)
Potassium: 4.5 mmol/L (ref 3.5–5.1)
Potassium: 4.7 mmol/L (ref 3.5–5.1)
Sodium: 138 mmol/L (ref 135–145)
Sodium: 136 mmol/L (ref 135–145)

## 2024-05-28 LAB — CBC
HCT: 38.8 % (ref 36.0–46.0)
HCT: 36.7 % (ref 36.0–46.0)
Hemoglobin: 13 g/dL (ref 12.0–15.0)
Hemoglobin: 12.2 g/dL (ref 12.0–15.0)
MCH: 29.7 pg (ref 26.0–34.0)
MCH: 29.1 pg (ref 26.0–34.0)
MCHC: 33.5 g/dL (ref 30.0–36.0)
MCHC: 33.2 g/dL (ref 30.0–36.0)
MCV: 88.6 fL (ref 80.0–100.0)
MCV: 87.6 fL (ref 80.0–100.0)
Platelets: 362 K/uL (ref 150–400)
Platelets: 351 K/uL (ref 150–400)
RBC: 4.38 MIL/uL (ref 3.87–5.11)
RBC: 4.19 MIL/uL (ref 3.87–5.11)
RDW: 14.1 % (ref 11.5–15.5)
RDW: 14.2 % (ref 11.5–15.5)
WBC: 19.6 K/uL — ABNORMAL HIGH (ref 4.0–10.5)
WBC: 22.6 K/uL — ABNORMAL HIGH (ref 4.0–10.5)
nRBC: 0 % (ref 0.0–0.2)
nRBC: 0 % (ref 0.0–0.2)

## 2024-05-28 LAB — GLUCOSE, CAPILLARY
Glucose-Capillary: 229 mg/dL — ABNORMAL HIGH (ref 70–99)
Glucose-Capillary: 221 mg/dL — ABNORMAL HIGH (ref 70–99)
Glucose-Capillary: 186 mg/dL — ABNORMAL HIGH (ref 70–99)
Glucose-Capillary: 219 mg/dL — ABNORMAL HIGH (ref 70–99)

## 2024-05-28 LAB — PROCALCITONIN: Procalcitonin: 0.11 ng/mL

## 2024-05-28 MED ORDER — INSULIN GLARGINE 100 UNIT/ML ~~LOC~~ SOLN
30.0000 [IU] | SUBCUTANEOUS | Status: DC
Start: 2024-05-28 — End: 2024-05-30
  Administered 2024-05-28 – 2024-05-29 (×2): 30 [IU] via SUBCUTANEOUS
  Filled 2024-05-28 (×3): qty 0.3

## 2024-05-28 MED ORDER — POLYETHYLENE GLYCOL 3350 17 G PO PACK
17.0000 g | PACK | Freq: Two times a day (BID) | ORAL | Status: DC
Start: 2024-05-29 — End: 2024-05-31
  Administered 2024-05-29: 17 g via ORAL
  Filled 2024-05-28: qty 1

## 2024-05-28 MED ORDER — PEG 3350-KCL-NA BICARB-NACL 420 G PO SOLR
200.0000 mL | Freq: Once | ORAL | Status: AC
  Administered 2024-05-28: 200 mL via ORAL
  Filled 2024-05-28: qty 4000

## 2024-05-28 MED ORDER — PIPERACILLIN-TAZOBACTAM 3.375 G IVPB
3.3750 g | Freq: Three times a day (TID) | INTRAVENOUS | Status: DC
  Administered 2024-05-28 – 2024-05-31 (×8): 3.375 g via INTRAVENOUS
  Filled 2024-05-28 (×8): qty 50

## 2024-05-28 MED ORDER — BISACODYL 10 MG RE SUPP
10.0000 mg | Freq: Once | RECTAL | Status: DC
  Filled 2024-05-28: qty 1

## 2024-05-28 MED ORDER — DAPAGLIFLOZIN PROPANEDIOL 10 MG PO TABS
10.0000 mg | ORAL_TABLET | Freq: Every morning | ORAL | Status: DC
  Administered 2024-05-29 – 2024-05-31 (×3): 10 mg via ORAL
  Filled 2024-05-28 (×3): qty 1

## 2024-05-28 MED ORDER — IOHEXOL 9 MG/ML PO SOLN
500.0000 mL | ORAL | Status: AC
  Administered 2024-05-28: 500 mL via ORAL

## 2024-05-28 MED ORDER — FUROSEMIDE 20 MG PO TABS
20.0000 mg | ORAL_TABLET | Freq: Every day | ORAL | Status: DC
  Administered 2024-05-29 – 2024-05-31 (×3): 20 mg via ORAL
  Filled 2024-05-28 (×3): qty 1

## 2024-05-28 NOTE — Inpatient Diabetes Management (Signed)
 Inpatient Diabetes Program Recommendations  AACE/ADA: New Consensus Statement on Inpatient Glycemic Control (2015)  Target Ranges:  Prepandial:   less than 140 mg/dL      Peak postprandial:   less than 180 mg/dL (1-2 hours)      Critically ill patients:  140 - 180 mg/dL    Latest Reference Range & Units 05/27/24 08:01 05/27/24 11:25 05/27/24 17:03 05/27/24 21:13  Glucose-Capillary 70 - 99 mg/dL 815 (H)  4 units Novolog   252 (H)  11 units Novolog   221 (H)  7 units Novolog   231 (H)   20 units Lantus   (H): Data is abnormally high  Latest Reference Range & Units 05/28/24 08:47  Glucose-Capillary 70 - 99 mg/dL 770 (H)  7 units Novolog    (H): Data is abnormally high   Home DM Meds: Freestyle Libre 3 CGM                             Farxiga  10 mg Daily                             Novolog  14 units daily                             Tresiba 85 units daily                             Ozempic  1 mg Qweek                                Current Orders: Lantus  20 units Q24H                           Novolog  Resistant Correction Scale/ SSI (0-20 units) TID AC      Farxiga  10 mg daily    MD- Note CBG 229 this AM  Please consider increasing the Lantus  to 30 units Q24H     --Will follow patient during hospitalization--  Adina Rudolpho Arrow RN, MSN, CDCES Diabetes Coordinator Inpatient Glycemic Control Team Team Pager: [phone] (8a-5p)

## 2024-05-28 NOTE — Progress Notes (Signed)
 [facility] Progress Note  Patient: Summer [name]    FMW:[id number]  DOA: 05/22/2024     Date of Service: the patient was seen and examined on 05/28/2024  Chief Complaint  Patient presents with   Abdominal Pain   Knee Pain   Brief hospital course:  Summer [name] is a 73 y.o. Caucasian female with medical history significant for HFpEF, asthma, COPD, coronary artery disease, stage IV CKD, type 2 diabetes mellitus and PAD, depression and GERD, who presented to the emergency room with acute onset of altered mental status with confusion.  She admits to urinary frequency, urgency, dysuria without hematuria or flank pain.  She has been having chills but did not report any measured fever.  She had a left total knee arthroplasty on 05/19/2024 and has been taking pain medications since.  She has been very confused over the last couple of days.  She admitted to nausea without vomiting.  She admitted to mild lower abdominal discomfort.  No chest pain or palpitations.  No cough or wheezing or dyspnea.   ED Course: When the patient came to the ER, BP was 139/109 and heart rate was 120 with otherwise normal vital signs.  Labs revealed hyperglycemia of 271 and a CO2 of 18 with a anion gap of 19 however VBG showed HCO3 of 23 and pH of 7.39.  Total bili was 1.5 and LFTs were otherwise normal.  Lactic acid was 1.6.  CBC showed leukocytosis of 15.7.  UA came back positive for UTI.  Urine culture was sent as well as 2 blood cultures. EKG as reviewed by me : None. Imaging: None.   The patient was given 2 L bolus of IV normal saline and 8 units of IV NovoLog , 2 g of IV Rocephin  and will be admitted to a medical telemetry bed for further evaluation and management.    Assessment and Plan:  # Sepsis due to UTI  sepsis is manifested by tachycardia, tachypnea and leukocytosis. - continue IV Rocephin  x 3 days till 9/13 - blood culture NGTD and urine cultures grew multiple species, recommended recollection - s/p IV  fluid for hydration  US  renal: No hydronephrosis or nephrolithiasis. Hepatic steatosis.  # Leukocytosis Unknown source, possible reactive WBC 19.6 >> 22.6 Procalcitonin negative Hold off antibiotics for now Follow CTAP   # AKI on CKD stage IIIa Baseline creatinine sCr 1.42-21.5--1.25>>1.28>>1.5 S/p IV fluid for hydration  # Hypophosphatemia due to nutritional deficiency Phos repleted orally. Check electrolytes daily and replete as needed.   # Uncontrolled type 2 diabetes mellitus with hyperglycemia, with long-term current use of insulin : - The patient will be placed on supplemental coverage with NovoLog . - continue basal coverage. - continue Lyrica  for diabetic neuropathy.   # Dyslipidemia: continue statin and Zetia .   # Essential hypertension 9/15 blood pressure soft, discontinued amlodipine  and decreased Lopressor  12.5 mg p.o. twice daily with holding parameter Monitor BP and titrate medications accordingly    # GERD without esophagitis:  continue PPI therapy.   # MDD (major depressive disorder) -continue Wellbutrin  XL and Zoloft  as well as Abilify .   # (HFpEF) heart failure with preserved ejection fraction  continue Lasix  and Farxiga  as well as beta-blocker therapy.   # S/p total left knee replacement done on 9/8 Orthopedic consulted, does not feel any signs of infection, recommended to resume formal physical therapy while in the hospital, weightbearing as tolerated on the left leg. She also was advised to ask one of her family members to  bring in her Polar Care device so that ice therapy may be continued during this hospitalization.   Continue PT and OT   # Constipation 9/16 Started laxatives, suppository given but no BM show mineral oil enema was also given.  Patient moved bowels a little bit 9/17 c/o rectal and lower abdominal pain after suppository and enema, does not feel good. CTAP order placed Noticed leukocytosis, but procalcitonin negative.    There  is no height or weight on file to calculate BMI.  Interventions:  Diet: Carb modified and heart healthy diet DVT Prophylaxis: Subcutaneous Lovenox    Advance goals of care discussion: Full code  Family Communication: family was not present at bedside, at the time of interview.  The pt provided permission to discuss medical plan with the family. Opportunity was given to ask question and all questions were answered satisfactorily.   Disposition:  Pt is from home, admitted with sepsis due to UTI, AKI, which resolved. stable to discharge to rehab. Discharge to home pt declined SNF as per [facility] 9/17 c/o rectal and lower abdominal pain after suppository and enema, does not feel good. CTAP order placed Noticed leukocytosis, but procalcitonin negative. F/u CT abd and pelvis and CBC     Subjective: No significant events overnight, patient was not feeling good today, complaining of lower abdominal pain after enema and suppository given yesterday.  Patient did not move bowels a little bit Agreed for CT scan abdomen pelvis, we will continue to monitor.    Physical Exam: General: NAD, lying comfortably Appear in no distress, affect appropriate Eyes: PERRLA ENT: Oral Mucosa Clear, moist  Neck: no JVD,  Cardiovascular: S1 and S2 Present, no Murmur,  Respiratory: good respiratory effort, Bilateral Air entry equal and Decreased, no Crackles, no wheezes Abdomen: BS present, Soft and LLQ tenderness,  Skin: no rashes Extremities: s/p left TKR, mild swollen & tender, dressing CDI, no significant pedal edema Neurologic: without any new focal findings Gait not checked due to patient safety concerns  Vitals:   05/27/24 1951 05/28/24 0405 05/28/24 0745 05/28/24 1400  BP: (!) 154/48 (!) 152/64 (!) 152/68 (!) 130/51  Pulse: 84 86 86 76  Resp: 20 20 16 18   Temp: 98.5 F (36.9 C) 98.2 F (36.8 C) 98.6 F (37 C) 98.2 F (36.8 C)  TempSrc:  Oral Oral Oral  SpO2: 97% 97% 95% 100%    Intake/Output  Summary (Last 24 hours) at 05/28/2024 1553 Last data filed at 05/28/2024 1300 Gross per 24 hour  Intake 360 ml  Output 100 ml  Net 260 ml   There were no vitals filed for this visit.  Data Reviewed: I have personally reviewed and interpreted daily labs, tele strips, imagings as discussed above. I reviewed all nursing notes, pharmacy notes, vitals, pertinent old records I have discussed plan of care as described above with RN and patient/family.  CBC: Recent Labs  Lab 05/25/24 0517 05/26/24 0442 05/27/24 0416 05/28/24 0439 05/28/24 1010  WBC 8.0 9.3 11.6* 19.6* 22.6*  HGB 10.6* 10.6* 10.7* 13.0 12.2  HCT 32.7* 32.7* 32.1* 38.8 36.7  MCV 88.1 90.3 88.7 88.6 87.6  PLT 210 231 256 362 351   Basic Metabolic Panel: Recent Labs  Lab 05/23/24 0751 05/24/24 0621 05/25/24 0517 05/26/24 0442 05/27/24 0416 05/28/24 0439 05/28/24 1010  NA 142 140 137 139 137 138 136  K 3.7 3.5 3.8 4.3 4.3 4.5 4.7  CL 109 105 101 103 103 98 99  CO2 26 23 25 27 24  26  24  GLUCOSE 100* 117* 164* 170* 192* 227* 243*  BUN 24* 29* 35* 39* 41* 41* 40*  CREATININE 1.25* 1.30* 1.28* 1.54* 1.44* 1.64* 1.51*  CALCIUM  9.2 9.0 9.3 9.4 9.3 10.1 9.7  MG 2.0 1.9 2.0 2.5*  --   --   --   PHOS 2.0* 3.8 3.8 3.8  --   --   --     Studies: No results found.   Scheduled Meds:  ARIPiprazole   2 mg Oral QPM   ascorbic acid   500 mg Oral BID   aspirin  EC  81 mg Oral Daily   bisacodyl   10 mg Oral QHS   buPROPion   150 mg Oral Daily   cholecalciferol   2,000 Units Oral BID   [START ON 05/29/2024] dapagliflozin  propanediol  10 mg Oral q morning   enoxaparin  (LOVENOX ) injection  0.5 mg/kg Subcutaneous Q24H   ezetimibe   10 mg Oral Daily   [START ON 05/29/2024] furosemide   20 mg Oral Daily   icosapent  Ethyl  2 g Oral BID   insulin  aspart  0-20 Units Subcutaneous TID WC   insulin  glargine  30 Units Subcutaneous Q24H   metoprolol  tartrate  12.5 mg Oral BID   polyethylene glycol  17 g Oral BID   pregabalin   150 mg Oral  BID   rosuvastatin   40 mg Oral Daily   sertraline   150 mg Oral Daily   traZODone   150 mg Oral QHS   Continuous Infusions:   PRN Meds: acetaminophen  **OR** acetaminophen , magnesium  hydroxide, ondansetron  **OR** ondansetron  (ZOFRAN ) IV, oxyCODONE   Time spent: 40 minutes  Author: ELVAN SOR. MD [facility] 05/28/2024 3:53 PM  To reach On-call, see care teams to locate the attending and reach out to them via [email or web address]. If 7PM-7AM, please contact night-coverage If you still have difficulty reaching the attending provider, please page the [facility] (Director on Call) for [facility] on amion for assistance.

## 2024-05-28 NOTE — Progress Notes (Addendum)
 PT Cancellation Note  Patient Details Name: BRYNLEE PENNYWELL MRN: [id number] DOB: 04-07-1951   Cancelled Treatment:     PT attempt. Pt c/o severe stomach pain. Still awaiting to have BM. She requested not to participate at this time. Encouraged OOB to help promote bowl movements but pt requested author return later this date.   3rd attempt today. Pt continues to endorse feeling awful with severe stomach pain. CT pending. Acute PT will retunr tomorrow and continue to progress per current POC.    Rankin KATHEE Essex 05/28/2024, 8:48 AM

## 2024-05-28 NOTE — Plan of Care (Signed)

## 2024-05-28 NOTE — Progress Notes (Signed)
 Occupational Therapy Treatment Patient Details Name: Summer [name] MRN: [id number] DOB: 13-Jul-1951 Today's Date: 05/28/2024   History of present illness Summer [name] is a 73 y.o. female with multiple medical problems including coronary artery disease, congestive heart failure, hyperlipidemia, hypertension, hypothyroidism, obesity, COPD, diabetes, chronic kidney disease, and sleep apnea, now status post a left total knee arthroplasty on 05/19/24 who presents to the ED 4 days post op with UTI.   OT comments  Pt seen for OT treatment on this date. Upon arrival to room pt resting in bed, easily wakes to voice. Pt initially refused OOB activity, agreed to attempt sitting on the EOB. Once moving pt had a BM, required MAXA for bed level pericare. Pt able to roll herself to L and R with cues to initiate. Pt requires setupA for grooming tasks at bed level. Pt educated on the benefits of OOB activity to prevent deconditioning and healing of recent TKA, pt verbalized understanding. Pt making fair progress toward goals, will continue to follow POC. Discharge recommendation remains appropriate.        If plan is discharge home, recommend the following:  A lot of help with bathing/dressing/bathroom;Assistance with cooking/housework;A little help with walking and/or transfers;Assist for transportation;Help with stairs or ramp for entrance   Equipment Recommendations  BSC/3in1    Recommendations for Other Services      Precautions / Restrictions Precautions Precautions: Knee Recall of Precautions/Restrictions: Intact Restrictions Weight Bearing Restrictions Per Provider Order: Yes LLE Weight Bearing Per Provider Order: Weight bearing as tolerated       Mobility Bed Mobility Overal bed mobility: Needs Assistance Bed Mobility: Rolling Rolling: Supervision         General bed mobility comments: Verbal cues to initiate rolling during pericare    Transfers Overall transfer level: Needs assistance                  General transfer comment: Pt declined OOB activity due to 8/10 stomach pain                                                ADL either performed or assessed with clinical judgement   ADL Overall ADL's : Needs assistance/impaired     Grooming: Set up;Wash/dry face;Wash/dry hands;Bed level               Lower Body Dressing: Maximal assistance;Bed level       Toileting- Clothing Manipulation and Hygiene: Maximal assistance;Bed level         General ADL Comments: MAXA bed level pericare, setupA washing face in bed     Communication Communication Communication: No apparent difficulties   Cognition Arousal: Alert Behavior During Therapy: WFL for tasks assessed/performed Cognition: No apparent impairments             OT - Cognition Comments: A/Ox4                 Following commands: Intact        Cueing   Cueing Techniques: Verbal cues, Tactile cues  Exercises Exercises: Other exercises Other Exercises Other Exercises: Edu: Role of OT, benefits of OOB mobility, positioning needs           General Comments Post operative dressing DCI    Pertinent Vitals/ Pain       Pain Assessment Pain Assessment: 0-10 Pain Score: 8  Pain Location: Stomach Pain Descriptors / Indicators: Sore, Aching Pain Intervention(s): Limited activity within patient's tolerance, Patient requesting pain meds-RN notified                                                          Frequency  Min 2X/week        Progress Toward Goals  OT Goals(current goals can now be found in the care plan section)  Progress towards OT goals: Progressing toward goals  Acute Rehab OT Goals OT Goal Formulation: With patient Time For Goal Achievement: 06/07/24 Potential to Achieve Goals: Good   AM-PAC OT 6 Clicks Daily Activity     Outcome Measure   Help from another person eating meals?: None Help from another  person taking care of personal grooming?: A Little Help from another person toileting, which includes using toliet, bedpan, or urinal?: A Little Help from another person bathing (including washing, rinsing, drying)?: A Lot Help from another person to put on and taking off regular upper body clothing?: A Little Help from another person to put on and taking off regular lower body clothing?: A Lot 6 Click Score: 17        OT Visit Diagnosis: Other abnormalities of gait and mobility (R26.89);Pain Pain - Right/Left: Left Pain - part of body: Knee   Activity Tolerance Patient tolerated treatment well   Patient Left in bed;with call bell/phone within reach;with bed alarm set   Nurse Communication Mobility status;Patient requests pain meds        Time: 8570-8557 OT Time Calculation (min): 13 min  Charges: OT General Charges $OT Visit: 1 Visit OT Treatments $Self Care/Home Management : 8-22 mins  Larraine Colas M.S. OTR/L  05/28/24, 2:57 PM

## 2024-05-29 DIAGNOSIS — K5289 Other specified noninfective gastroenteritis and colitis: Secondary | ICD-10-CM | POA: Diagnosis not present

## 2024-05-29 DIAGNOSIS — K219 Gastro-esophageal reflux disease without esophagitis: Secondary | ICD-10-CM | POA: Diagnosis not present

## 2024-05-29 DIAGNOSIS — E1165 Type 2 diabetes mellitus with hyperglycemia: Secondary | ICD-10-CM | POA: Diagnosis not present

## 2024-05-29 DIAGNOSIS — I10 Essential (primary) hypertension: Secondary | ICD-10-CM | POA: Diagnosis not present

## 2024-05-29 DIAGNOSIS — E785 Hyperlipidemia, unspecified: Secondary | ICD-10-CM | POA: Diagnosis not present

## 2024-05-29 DIAGNOSIS — Z794 Long term (current) use of insulin: Secondary | ICD-10-CM | POA: Diagnosis not present

## 2024-05-29 DIAGNOSIS — A4150 Gram-negative sepsis, unspecified: Secondary | ICD-10-CM | POA: Diagnosis not present

## 2024-05-29 DIAGNOSIS — N390 Urinary tract infection, site not specified: Secondary | ICD-10-CM | POA: Diagnosis not present

## 2024-05-29 LAB — GLUCOSE, CAPILLARY
Glucose-Capillary: 222 mg/dL — ABNORMAL HIGH (ref 70–99)
Glucose-Capillary: 242 mg/dL — ABNORMAL HIGH (ref 70–99)
Glucose-Capillary: 256 mg/dL — ABNORMAL HIGH (ref 70–99)
Glucose-Capillary: 249 mg/dL — ABNORMAL HIGH (ref 70–99)

## 2024-05-29 MED ORDER — AMLODIPINE BESYLATE 5 MG PO TABS
5.0000 mg | ORAL_TABLET | Freq: Every day | ORAL | Status: DC
  Administered 2024-05-29 – 2024-05-31 (×3): 5 mg via ORAL
  Filled 2024-05-29 (×3): qty 1

## 2024-05-29 NOTE — Inpatient Diabetes Management (Signed)
 Inpatient Diabetes Program Recommendations  AACE/ADA: New Consensus Statement on Inpatient Glycemic Control (2015)  Target Ranges:  Prepandial:   less than 140 mg/dL      Peak postprandial:   less than 180 mg/dL (1-2 hours)      Critically ill patients:  140 - 180 mg/dL    Latest Reference Range & Units 05/28/24 08:47 05/28/24 11:14 05/28/24 16:52 05/28/24 20:21  Glucose-Capillary 70 - 99 mg/dL 770 (H)  7 units Novolog   221 (H)  7 units Novolog   186 (H)  4 units Novolog   219 (H)   30 units Lantus   (H): Data is abnormally high  Latest Reference Range & Units 05/29/24 08:19  Glucose-Capillary 70 - 99 mg/dL 777 (H)  7 units Novolog    (H): Data is abnormally high   Home DM Meds: Freestyle Libre 3 CGM                             Farxiga  10 mg Daily                             Novolog  14 units daily                             Tresiba 85 units daily                             Ozempic  1 mg Qweek                                Current Orders: Lantus  30 units Q24H                           Novolog  Resistant Correction Scale/ SSI (0-20 units) TID AC                            Farxiga  10 mg daily       MD- Note CBG 222 this AM   Please consider increasing the Lantus  to 35 units Q24H         --Will follow patient during hospitalization--   Adina Rudolpho Arrow RN, MSN, CDCES Diabetes Coordinator Inpatient Glycemic Control Team Team Pager: [phone] (8a-5p)

## 2024-05-29 NOTE — Progress Notes (Signed)
 Progress Note   Patient: Summer [name] FMW:[id number] DOB: Oct 03, 1950 DOA: 05/22/2024     0 DOS: the patient was seen and examined on 05/29/2024   Brief hospital course: Summer [name] is a 73 y.o. Caucasian female with medical history significant for HFpEF, asthma, COPD, coronary artery disease, stage IV CKD, type 2 diabetes mellitus and PAD, depression and GERD, who presented to the emergency room with acute onset of altered mental status with confusion.  She admits to urinary frequency, urgency, dysuria without hematuria or flank pain.  She has been having chills but did not report any measured fever.  She had a left total knee arthroplasty on 05/19/2024 and has been taking pain medications since.  She has been very confused over the last couple of days.  She admitted to nausea without vomiting.  She admitted to mild lower abdominal discomfort.  No chest pain or palpitations.  No cough or wheezing or dyspnea.   ED Course: When the patient came to the ER, BP was 139/109 and heart rate was 120 with otherwise normal vital signs.  Labs revealed hyperglycemia of 271 and a CO2 of 18 with a anion gap of 19 however VBG showed HCO3 of 23 and pH of 7.39.  Total bili was 1.5 and LFTs were otherwise normal.  Lactic acid was 1.6.  CBC showed leukocytosis of 15.7.  UA came back positive for UTI.  Urine culture was sent as well as 2 blood cultures. EKG as reviewed by me : None. Imaging: None.   The patient was given 2 L bolus of IV normal saline and 8 units of IV NovoLog , 2 g of IV Rocephin  admitted to a medical telemetry bed for further evaluation and management.  Assessment and Plan: Sepsis due to UTI  sepsis is manifested by tachycardia, tachypnea and leukocytosis. Received IV Rocephin  x 3 days till 9/13 Blood culture NGTD and urine cultures grew multiple species, recommended recollection s/p IV fluid for hydration. US  renal: No hydronephrosis or nephrolithiasis. Hepatic steatosis.  Stercoral  proctitis Significant Leukocytosis CT abdomen/ pelvis reviewed, she still has rectal, lower abdominal pain. WBC 19.6 >> 22.6. Continue IV Zosyn  therapy. She did have large bm today. Repeat labs ordered.  CKD stage IIIa Baseline creatinine Kidney function stable. Monitor daily renal function. Avoid nephrotoxic drugs.  Hypophosphatemia due to nutritional deficiency Phos repleted orally.  Uncontrolled type 2 diabetes mellitus with hyperglycemia, with long-term current use of insulin : A1C 7.7. Continue supplemental coverage with NovoLog , Lantus  30 units qd.   Dyslipidemia: continue statin and Zetia .   Essential hypertension Decreased Lopressor  12.5 mg p.o. twice daily with holding parameter. Norvasc  resumed as BP improved. Monitor BP and titrate medications accordingly   GERD without esophagitis:  continue PPI.   MDD (major depressive disorder) Continue Wellbutrin  XL and Zoloft  as well as Abilify .   (HFpEF) heart failure with preserved ejection fraction Continue Lasix  and Farxiga  as well as beta-blocker therapy.   S/p total left knee replacement done on 9/8 Orthopedic surgery team does not feel any signs of infection, recommended to resume formal physical therapy while in the hospital, weightbearing as tolerated on the left leg. She also was advised to ask one of her family members to bring in her Polar Care device so that ice therapy may be continued during this hospitalization.   Continue PT and OT    PT/ OT advised SNF, she declined wishes to go with HHPT/ OT. Out of bed to chair. Incentive spirometry. Nursing supportive care. Fall, aspiration  precautions. Diet:  Diet Orders (From admission, onward)     Start     Ordered   05/23/24 1449  Diet heart healthy/carb modified Room service appropriate? Yes; Fluid consistency: Thin  Diet effective now       Question Answer Comment  Diet-HS Snack? Nothing   Room service appropriate? Yes   Fluid consistency: Thin       05/23/24 1448           DVT prophylaxis: SCD  Level of care: Telemetry Medical   Code Status: Full Code  Subjective: Patient is seen and examined today morning. She is lying in bed, advised out of bed to chair. Does have abdominal discomfort due to constipation. Did receive laxative.  Physical Exam: Vitals:   05/28/24 1651 05/28/24 2021 05/29/24 0345 05/29/24 0902  BP: (!) 125/49 (!) 144/52 (!) 145/53 (!) 152/58  Pulse: 71 76 75 79  Resp: 18 16 16 17   Temp: 98.2 F (36.8 C) 98.7 F (37.1 C) 98 F (36.7 C) 98.4 F (36.9 C)  TempSrc: Oral Oral Oral   SpO2: 96% 99% 95% 97%    General - Elderly Caucasian obese female, distress due to abdominal discomfort. HEENT - PERRLA, EOMI, atraumatic head, non tender sinuses. Lung - distant rales, rhonchi, no wheezes. Heart - S1, S2 heard, no murmurs, rubs, trace pedal edema. Abdomen - Soft, lower abdomen tender, bowel sounds good Neuro - Alert, awake and oriented x 3, non focal exam. Skin - Warm and dry.  Data Reviewed:      Latest Ref Rng & Units 05/28/2024   10:10 AM 05/28/2024    4:39 AM 05/27/2024    4:16 AM  CBC  WBC 4.0 - 10.5 K/uL 22.6  19.6  11.6   Hemoglobin 12.0 - 15.0 g/dL 87.7  86.9  89.2   Hematocrit 36.0 - 46.0 % 36.7  38.8  32.1   Platelets 150 - 400 K/uL 351  362  256       Latest Ref Rng & Units 05/28/2024   10:10 AM 05/28/2024    4:39 AM 05/27/2024    4:16 AM  BMP  Glucose 70 - 99 mg/dL 756  772  807   BUN 8 - 23 mg/dL 40  41  41   Creatinine 0.44 - 1.00 mg/dL 8.48  8.35  8.55   Sodium 135 - 145 mmol/L 136  138  137   Potassium 3.5 - 5.1 mmol/L 4.7  4.5  4.3   Chloride 98 - 111 mmol/L 99  98  103   CO2 22 - 32 mmol/L 24  26  24    Calcium  8.9 - 10.3 mg/dL 9.7  89.8  9.3    CT ABDOMEN PELVIS WO CONTRAST Result Date: 05/28/2024 CLINICAL DATA:  Left lower quadrant abdominal pain.  Leukocytosis. EXAM: CT ABDOMEN AND PELVIS WITHOUT CONTRAST TECHNIQUE: Multidetector CT imaging of the abdomen and pelvis was  performed following the standard protocol without IV contrast. RADIATION DOSE REDUCTION: This exam was performed according to the departmental dose-optimization program which includes automated exposure control, adjustment of the mA and/or kV according to patient size and/or use of iterative reconstruction technique. COMPARISON:  10/14/2019 FINDINGS: Lower chest: 5 mm nodular structure in the medial right lower lobe on image 5/4 may be vascular but appears stable since 2021. No acute abnormality in the lung bases. Hepatobiliary: High-density material at the base of the gallbladder is suggestive for stones or sludge. No evidence for gallbladder inflammation. Normal appearance of the  liver. Pancreas: Unremarkable. No pancreatic ductal dilatation or surrounding inflammatory changes. Spleen: Normal in size without focal abnormality. Adrenals/Urinary Tract: Normal appearance of the adrenal glands. Mild perinephric stranding bilaterally. Negative for stones or hydronephrosis. 1.0 cm hyperdense structure along the periphery of the right kidney measures up to 79 Hounsfield units and likely represents a benign etiology. Moderate distention of the urinary bladder. Stomach/Bowel: New stranding around the rectum and presacral region. Rectum is moderately distended with stool. There are some concentric areas of gas along the anterior aspect of the rectum lumen. Suspect this is within the lumen rather than within the rectal wall. Findings are concerning for stercoral proctitis. Moderate amount of stool throughout the remainder of the colon. No evidence for bowel dilatation or obstruction. Small hiatal hernia. Vascular/Lymphatic: Aortic atherosclerosis. No enlarged abdominal or pelvic lymph nodes. Reproductive: Status post hysterectomy. No adnexal masses. Other: Negative for free fluid. Negative for free air. Small amount of subcutaneous gas in the lateral left abdomen is probably from an injection site. Small umbilical hernia  containing fat. Musculoskeletal: Old fracture involving the medial right tenth rib. Multilevel degenerative disc disease in the lumbar spine. Multilevel degenerative facet disease. IMPRESSION: 1. New stranding around the rectum and presacral region. Findings are concerning for stercoral proctitis. 2. Moderate amount of stool throughout the colon. 3. Cholelithiasis without evidence for gallbladder inflammation. 4. Aortic Atherosclerosis (ICD10-I70.0). Electronically Signed   By: Juliene Balder M.D.   On: 05/28/2024 17:01    Family Communication: Discussed with patient, she understand and agree. All questions answered.  Disposition: Status is: Observation The patient remains OBS appropriate and will d/c before 2 midnights.  Planned Discharge Destination: Home with Home Health as she refused SNF     Time spent: 45 minutes  Author: Concepcion Riser, MD 05/29/2024 2:11 PM Secure chat 7am to 7pm For on call review [email or web address].

## 2024-05-29 NOTE — Plan of Care (Addendum)

## 2024-05-29 NOTE — Progress Notes (Signed)
 Physical Therapy Treatment Patient Details Name: Summer [name] MRN: [id number] DOB: 1951/05/21 Today's Date: 05/29/2024   History of Present Illness Summer [name] is a 73 y.o. female with multiple medical problems including coronary artery disease, congestive heart failure, hyperlipidemia, hypertension, hypothyroidism, obesity, COPD, diabetes, chronic kidney disease, and sleep apnea, now status post a left total knee arthroplasty on 05/19/24 who presents to the ED 4 days post op with UTI.    PT Comments  Pt was supine in bed upon arrival. She agrees to session and endorses feeling much better than previous date. Pt RN staff, pt has had multiple Bms throughout the day today. Pt did not endorse needing to use BR again until she had started ambulating. Unable to make it to [facility] prior to her having first BM during this session. Author assisted with hygiene care prior to pt standing and ambulating a few more ft and requested to get back onto [facility]. Pt had 2 x formed BM. RN staff made aware. PT session was limited this date. Dc recs do remain appropriate. Author will return tomorrow in hopes of progressing ambulation and OOB activity.    If plan is discharge home, recommend the following: A little help with bathing/dressing/bathroom;Assistance with cooking/housework;Assist for transportation;Help with stairs or ramp for entrance;A lot of help with walking and/or transfers     Equipment Recommendations  Rolling walker (2 wheels)       Precautions / Restrictions Precautions Precautions: Knee Precaution Booklet Issued: Yes (comment) Recall of Precautions/Restrictions: Intact Restrictions Weight Bearing Restrictions Per Provider Order: Yes LLE Weight Bearing Per Provider Order: Weight bearing as tolerated     Mobility  Bed Mobility Overal bed mobility: Needs Assistance Bed Mobility: Supine to Sit, Sit to Supine Rolling: Supervision Supine to sit: Supervision Sit to supine: Min assist    Transfers Overall transfer level: Needs assistance Equipment used: Rolling walker (2 wheels) Transfers: Sit to/from Stand Sit to Stand: Contact guard assist  General transfer comment: CGA for safety    Ambulation/Gait Ambulation/Gait assistance: Contact guard assist Gait Distance (Feet): 8 Feet Assistive device: Rolling walker (2 wheels) Gait Pattern/deviations: Step-through pattern, Decreased step length - right, Decreased step length - left Gait velocity: decreased  General Gait Details: gait distance greatly limited by pt having BMs x 2 during limited session    Balance Overall balance assessment: Needs assistance Sitting-balance support: No upper extremity supported, Feet supported Sitting balance-Leahy Scale: Good     Standing balance support: Bilateral upper extremity supported Standing balance-Leahy Scale: Fair Standing balance comment: reliant on RW for support     Communication Communication Communication: No apparent difficulties  Cognition Arousal: Alert Behavior During Therapy: WFL for tasks assessed/performed   PT - Cognitive impairments: No apparent impairments    PT - Cognition Comments: Pt endorses feeling better than previous date and was agreeable to session. She is A and O x4 Following commands: Intact      Cueing Cueing Techniques: Verbal cues, Tactile cues     General Comments General comments (skin integrity, edema, etc.): Author reviewed importance of positioning after post TKA and need to continue to work on ROM/strengthening. unable to perform HEP exercises due to multiple BMs      Pertinent Vitals/Pain Pain Assessment Pain Assessment: 0-10 Pain Score: 4  Pain Location: knee Pain Descriptors / Indicators: Sore, Aching Pain Intervention(s): Limited activity within patient's tolerance, Monitored during session, Premedicated before session, Repositioned     PT Goals (current goals can now be found in the  care plan section) Acute Rehab PT  Goals Patient Stated Goal: to get back home Progress towards PT goals: Progressing toward goals    Frequency    7X/week       AM-PAC PT 6 Clicks Mobility   Outcome Measure  Help needed turning from your back to your side while in a flat bed without using bedrails?: A Little Help needed moving from lying on your back to sitting on the side of a flat bed without using bedrails?: A Little Help needed moving to and from a bed to a chair (including a wheelchair)?: A Little Help needed standing up from a chair using your arms (e.g., wheelchair or bedside chair)?: A Little Help needed to walk in hospital room?: A Little Help needed climbing 3-5 steps with a railing? : A Little 6 Click Score: 18    End of Session   Activity Tolerance: Patient tolerated treatment well;Other (comment) (limited by having multiple BMs during session) Patient left: in bed;with call bell/phone within reach;with bed alarm set Nurse Communication: Mobility status PT Visit Diagnosis: Muscle weakness (generalized) (M62.81);Other abnormalities of gait and mobility (R26.89);Difficulty in walking, not elsewhere classified (R26.2);Pain Pain - Right/Left: Left Pain - part of body: Knee     Time: 8397-8377 PT Time Calculation (min) (ACUTE ONLY): 20 min  Charges:    $Therapeutic Activity: 8-22 mins PT General Charges $$ ACUTE PT VISIT: 1 Visit                     Rankin Essex PTA 05/29/24, 5:03 PM

## 2024-05-29 NOTE — Progress Notes (Signed)
 Mobility Specialist - Progress Note   05/29/24 1156  Mobility  Activity Stood at bedside;Pivoted/transferred from bed to chair;Pivoted/transferred from chair to bed;Dangled on edge of bed  Level of Assistance Minimal assist, patient does 75% or more  Assistive Device Front wheel walker  Distance Ambulated (ft) 4 ft  LLE Weight Bearing Per Provider Order WBAT  Activity Response Tolerated well  Mobility visit 1 Mobility  Mobility Specialist Start Time (ACUTE ONLY) 1050  Mobility Specialist Stop Time (ACUTE ONLY) 1126  Mobility Specialist Time Calculation (min) (ACUTE ONLY) 36 min   Pt supine upon entry, utilizing RA-- soiled. Pt agreeable to transfer to the recliner, denies LLE pain. Pt rolled L to R MinA, requiring MaxA for peri care-- sore bottom, RN notified. Pt completed bed mob ModA to bring trunk from to sup to sit. Once seated EOB Pt has the urge to BM, however denies transfer to the [facility] opting to use the bathroom while seated EOB. Pt STS to RW and transfers to the recliner via SPT MinA, denies staying in the recliner d/t soreness (bottom). Pt transferred to bed-- urination and BM during transfer requiring MaxA peri care. Pt left supine with alarm set and needs within reach.  America Silvan Mobility Specialist 05/29/24 12:05 PM

## 2024-05-29 NOTE — Plan of Care (Signed)

## 2024-05-30 DIAGNOSIS — A4150 Gram-negative sepsis, unspecified: Secondary | ICD-10-CM | POA: Diagnosis not present

## 2024-05-30 DIAGNOSIS — N390 Urinary tract infection, site not specified: Secondary | ICD-10-CM | POA: Diagnosis not present

## 2024-05-30 LAB — CBC
HCT: 34.2 % — ABNORMAL LOW (ref 36.0–46.0)
Hemoglobin: 11.6 g/dL — ABNORMAL LOW (ref 12.0–15.0)
MCH: 29.4 pg (ref 26.0–34.0)
MCHC: 33.9 g/dL (ref 30.0–36.0)
MCV: 86.6 fL (ref 80.0–100.0)
Platelets: 311 K/uL (ref 150–400)
RBC: 3.95 MIL/uL (ref 3.87–5.11)
RDW: 14.1 % (ref 11.5–15.5)
WBC: 14.2 K/uL — ABNORMAL HIGH (ref 4.0–10.5)
nRBC: 0 % (ref 0.0–0.2)

## 2024-05-30 LAB — BASIC METABOLIC PANEL WITH GFR
Anion gap: 12 (ref 5–15)
BUN: 48 mg/dL — ABNORMAL HIGH (ref 8–23)
CO2: 25 mmol/L (ref 22–32)
Calcium: 9.4 mg/dL (ref 8.9–10.3)
Chloride: 97 mmol/L — ABNORMAL LOW (ref 98–111)
Creatinine, Ser: 1.8 mg/dL — ABNORMAL HIGH (ref 0.44–1.00)
GFR, Estimated: 29 mL/min — ABNORMAL LOW (ref >60)
Glucose, Bld: 184 mg/dL — ABNORMAL HIGH (ref 70–99)
Potassium: 3.8 mmol/L (ref 3.5–5.1)
Sodium: 134 mmol/L — ABNORMAL LOW (ref 135–145)

## 2024-05-30 LAB — GLUCOSE, CAPILLARY
Glucose-Capillary: 202 mg/dL — ABNORMAL HIGH (ref 70–99)
Glucose-Capillary: 264 mg/dL — ABNORMAL HIGH (ref 70–99)
Glucose-Capillary: 212 mg/dL — ABNORMAL HIGH (ref 70–99)
Glucose-Capillary: 251 mg/dL — ABNORMAL HIGH (ref 70–99)

## 2024-05-30 MED ORDER — OXYCODONE HCL 5 MG PO TABS
7.5000 mg | ORAL_TABLET | ORAL | Status: DC | PRN
  Administered 2024-05-30 – 2024-05-31 (×3): 7.5 mg via ORAL
  Filled 2024-05-30 (×3): qty 2

## 2024-05-30 MED ORDER — INSULIN GLARGINE 100 UNIT/ML ~~LOC~~ SOLN
35.0000 [IU] | SUBCUTANEOUS | Status: DC
  Administered 2024-05-30: 35 [IU] via SUBCUTANEOUS
  Filled 2024-05-30 (×2): qty 0.35

## 2024-05-30 MED ORDER — OXYCODONE HCL 5 MG PO TABS
5.0000 mg | ORAL_TABLET | ORAL | Status: DC | PRN
Start: 2024-05-30 — End: 2024-05-30

## 2024-05-30 MED ORDER — SODIUM CHLORIDE 0.9 % IV SOLN: INTRAVENOUS | Status: AC

## 2024-05-30 NOTE — Plan of Care (Signed)

## 2024-05-30 NOTE — Progress Notes (Signed)
 PROGRESS NOTE    Summer [name]  FMW:[id number] DOB: [birth year] DOA: 05/22/2024 PCP: Sowles, Krichna, MD   Brief Narrative: This 73 yrs old female with PMH significant for HFpEF, asthma, COPD, Coronary artery disease, stage IV CKD, type 2 diabetes mellitus and PAD, depression and GERD, who presented to the ED with acute onset of altered mental status with confusion.  Patient reports urinary frequency, urgency, dysuria without hematuria or flank pain.  Also reports chills but did not report any fever.  She has left total knee arthroplasty on 05/19/2024 and has been taking pain medication so far.She has been very confused over last few days. She was hypertensive and tachycardic in the ED. Labs revealed hyperglycemia of 271 and a CO2 of 18 with anion gap of 19 however VBG showed HCO3 of 23 and pH of 7.39. UA+ for UTI. Patient admitted for further evaluation, started on empiric antibiotics.  Assessment & Plan:   Principal Problem:   Sepsis due to gram-negative UTI ([name]) Active Problems:   Uncontrolled type 2 diabetes mellitus with hyperglycemia, with long-term current use of insulin  ([name])   Dyslipidemia   Essential hypertension   (HFpEF) heart failure with preserved ejection fraction ([name])   MDD (major depressive disorder)   GERD without esophagitis   Stercoral colitis   Sepsis due to UTI : She presented with multiple complaints (urinary frequency, urgency, chills, nausea, abdominal discomfort.) Sepsis is manifested by tachycardia, tachypnea and leukocytosis. Received IV Rocephin  x 3 days till 05/24/24 Blood culture NGTD and urine cultures grew multiple species, recommended recollection. US  renal: No hydronephrosis or nephrolithiasis. Hepatic steatosis. Continue IV fluid resuscitation.  Sepsis physiology improving.   Stercoral proctitis: Significant Leukocytosis: CT abdomen/ pelvis reviewed, She still has rectal, lower abdominal pain. WBC 19.6 >> 22.6 > 14.1. Continue IV Zosyn  therapy. She  did have a bowel movement yesterday but no bowel movement today. White cell count is improving.   CKD stage IIIa Serum creatinine at baseline. Kidney function stable. Monitor daily renal function. Avoid nephrotoxic drugs.   Hypophosphatemia: Likely due to nutritional deficiency: Replaced.  Continue to monitor.   Uncontrolled type 2 diabetes mellitus with hyperglycemia, with long-term current use of insulin : Hb A1C 7.7. Continue supplemental coverage with NovoLog ,  Increase Lantus  35 units qd.   Dyslipidemia:  Continue statin and Zetia .   Essential hypertension: Decreased Lopressor  12.5 mg twice daily with holding parameter. Norvasc  resumed as BP improved. Monitor BP and titrate medications accordingly.   GERD without esophagitis:   Continue PPI.   MDD (major depressive disorder): Continue Wellbutrin  XL and Zoloft  as well as Abilify .   (HFpEF) heart failure with preserved ejection fraction Continue Lasix  and Farxiga  as well as beta-blocker therapy.   S/p total left knee replacement done on 05/19/24: Orthopedic surgery team does not feel any signs of infection, recommended to resume formal physical therapy while in the hospital, weightbearing as tolerated on the left leg. She also was advised to ask one of her family members to bring in her Polar Care device so that ice therapy may be continued during this hospitalization.   Continue PT and OT    DVT prophylaxis: SCDs Code Status: Full code Family Communication:No family at bed side Disposition Plan:    Status is: Observation The patient remains OBS appropriate and will d/c before 2 midnights.   Admitted for sepsis secondary to UTI which has been resolved.   Now patient has developed stercoral colitis requiring IV antibiotics and bowel regimen and pain control.  Consultants:  None  Procedures: None  Antimicrobials:  Anti-infectives (From admission, onward)    Start     Dose/Rate Route Frequency Ordered Stop    05/28/24 2000  piperacillin -tazobactam (ZOSYN ) IVPB 3.375 g        3.375 g 12.5 mL/hr over 240 Minutes Intravenous Every 8 hours 05/28/24 1752     05/24/24 2000  cefTRIAXone  (ROCEPHIN ) 2 g in sodium chloride  0.9 % 100 mL IVPB        2 g 200 mL/hr over 30 Minutes Intravenous Every 24 hours 05/24/24 1308 05/24/24 2140   05/23/24 2000  cefTRIAXone  (ROCEPHIN ) 2 g in sodium chloride  0.9 % 100 mL IVPB  Status:  Discontinued        2 g 200 mL/hr over 30 Minutes Intravenous Every 24 hours 05/22/24 2141 05/24/24 1308   05/22/24 2145  cefTRIAXone  (ROCEPHIN ) 1 g in sodium chloride  0.9 % 100 mL IVPB  Status:  Discontinued        1 g 200 mL/hr over 30 Minutes Intravenous  Once 05/22/24 2143 05/22/24 2144   05/22/24 2115  cefTRIAXone  (ROCEPHIN ) 1 g in sodium chloride  0.9 % 100 mL IVPB        1 g 200 mL/hr over 30 Minutes Intravenous  Once 05/22/24 2113 05/23/24 0019   05/22/24 2015  cefTRIAXone  (ROCEPHIN ) 1 g in sodium chloride  0.9 % 100 mL IVPB        1 g 200 mL/hr over 30 Minutes Intravenous  Once 05/22/24 2009 05/22/24 2059       Subjective: Patient was seen and examined at bed side, overnight events noted. Patient reports feeling better,  still reports  pain while she having bowel movement.  Objective: Vitals:   05/29/24 2048 05/30/24 0759 05/30/24 0819 05/30/24 0820  BP: 139/63 (!) 130/50 (!) 136/57 (!) 136/57  Pulse: 77 67 75 75  Resp: 16 18    Temp: 98.3 F (36.8 C) 98.1 F (36.7 C)    TempSrc:  Oral    SpO2: 95% 99%      Intake/Output Summary (Last 24 hours) at 05/30/2024 1011 Last data filed at 05/30/2024 0757 Gross per 24 hour  Intake 0 ml  Output 400 ml  Net -400 ml   There were no vitals filed for this visit.  Examination:  General exam: Appears calm and comfortable, not in any acute distress. Respiratory system: Clear to auscultation. Respiratory effort normal.  RR 14 Cardiovascular system: S1 & S2 heard, RRR. No JVD, murmurs, rubs, gallops or clicks.   Gastrointestinal system: Abdomen is non distended, soft and non tender. Normal bowel sounds heard. Central nervous system: Alert and oriented x 3. No focal neurological deficits. Extremities: No edema, no cyanosis, no clubbing. Skin: No rashes, lesions or ulcers Psychiatry: Judgement and insight appear normal. Mood & affect appropriate.     Data Reviewed: I have personally reviewed following labs and imaging studies  CBC: Recent Labs  Lab 05/26/24 0442 05/27/24 0416 05/28/24 0439 05/28/24 1010 05/30/24 0457  WBC 9.3 11.6* 19.6* 22.6* 14.2*  HGB 10.6* 10.7* 13.0 12.2 11.6*  HCT 32.7* 32.1* 38.8 36.7 34.2*  MCV 90.3 88.7 88.6 87.6 86.6  PLT 231 256 362 351 311   Basic Metabolic Panel: Recent Labs  Lab 05/24/24 0621 05/25/24 0517 05/26/24 0442 05/27/24 0416 05/28/24 0439 05/28/24 1010 05/30/24 0457  NA 140 137 139 137 138 136 134*  K 3.5 3.8 4.3 4.3 4.5 4.7 3.8  CL 105 101 103 103 98 99 97*  CO2 23 25  27 24 26 24 25   GLUCOSE 117* 164* 170* 192* 227* 243* 184*  BUN 29* 35* 39* 41* 41* 40* 48*  CREATININE 1.30* 1.28* 1.54* 1.44* 1.64* 1.51* 1.80*  CALCIUM  9.0 9.3 9.4 9.3 10.1 9.7 9.4  MG 1.9 2.0 2.5*  --   --   --   --   PHOS 3.8 3.8 3.8  --   --   --   --    GFR: Estimated Creatinine Clearance: 30.6 mL/min (A) (by C-G formula based on SCr of 1.8 mg/dL (H)). Liver Function Tests: No results for input(s): AST, ALT, ALKPHOS, BILITOT, PROT, ALBUMIN in the last 168 hours. No results for input(s): LIPASE, AMYLASE in the last 168 hours. No results for input(s): AMMONIA in the last 168 hours. Coagulation Profile: No results for input(s): INR, PROTIME in the last 168 hours. Cardiac Enzymes: No results for input(s): CKTOTAL, CKMB, CKMBINDEX, TROPONINI in the last 168 hours. BNP (last 3 results) No results for input(s): PROBNP in the last 8760 hours. HbA1C: No results for input(s): HGBA1C in the last 72 hours. CBG: Recent Labs  Lab  05/29/24 0819 05/29/24 1212 05/29/24 1733 05/29/24 2118 05/30/24 0801  GLUCAP 222* 242* 256* 249* 202*   Lipid Profile: No results for input(s): CHOL, HDL, LDLCALC, TRIG, CHOLHDL, LDLDIRECT in the last 72 hours. Thyroid  Function Tests: No results for input(s): TSH, T4TOTAL, FREET4, T3FREE, THYROIDAB in the last 72 hours. Anemia Panel: No results for input(s): VITAMINB12, FOLATE, FERRITIN, TIBC, IRON, RETICCTPCT in the last 72 hours. Sepsis Labs: Recent Labs  Lab 05/28/24 1010  PROCALCITON 0.11    Recent Results (from the past 240 hours)  Urine Culture     Status: Abnormal   Collection Time: 05/22/24  8:23 PM   Specimen: Urine, Clean Catch  Result Value Ref Range Status   Specimen Description   Final    URINE, CLEAN CATCH Performed at [facility], [address] [place], [place] [address]    Special Requests   Final    NONE Performed at [facility], [address] [place], [place] [address]    Culture MULTIPLE SPECIES PRESENT, SUGGEST RECOLLECTION (A)  Final   Report Status 05/24/2024 FINAL  Final  Resp panel by RT-PCR (RSV, Flu A&B, Covid) Anterior Nasal Swab     Status: None   Collection Time: 05/22/24  9:28 PM   Specimen: Anterior Nasal Swab  Result Value Ref Range Status   SARS Coronavirus 2 by RT PCR NEGATIVE NEGATIVE Final    Comment: (NOTE) SARS-CoV-2 target nucleic acids are NOT DETECTED.  The SARS-CoV-2 RNA is generally detectable in upper respiratory specimens during the acute phase of infection. The lowest concentration of SARS-CoV-2 viral copies this assay can detect is 138 copies/mL. A negative result does not preclude SARS-Cov-2 infection and should not be used as the sole basis for treatment or other patient management decisions. A negative result may occur with  improper specimen collection/handling, submission of specimen other than nasopharyngeal swab, presence of viral mutation(s) within  the areas targeted by this assay, and inadequate number of viral copies(<138 copies/mL). A negative result must be combined with clinical observations, patient history, and epidemiological information. The expected result is Negative.  Fact Sheet for Patients:  BloggerCourse.com  Fact Sheet for Healthcare Providers:  SeriousBroker.it  This test is no t yet approved or cleared by the [place]  FDA and  has been authorized for detection and/or diagnosis of SARS-CoV-2 by FDA under an Emergency Use Authorization (  EUA). This EUA will remain  in effect (meaning this test can be used) for the duration of the COVID-19 declaration under Section 564(b)(1) of the Act, 21 U.S.C.section 360bbb-3(b)(1), unless the authorization is terminated  or revoked sooner.       Influenza A by PCR NEGATIVE NEGATIVE Final   Influenza B by PCR NEGATIVE NEGATIVE Final    Comment: (NOTE) The Xpert Xpress SARS-CoV-2/FLU/RSV plus assay is intended as an aid in the diagnosis of influenza from Nasopharyngeal swab specimens and should not be used as a sole basis for treatment. Nasal washings and aspirates are unacceptable for Xpert Xpress SARS-CoV-2/FLU/RSV testing.  Fact Sheet for Patients: BloggerCourse.com  Fact Sheet for Healthcare Providers: SeriousBroker.it  This test is not yet approved or cleared by the [place]  FDA and has been authorized for detection and/or diagnosis of SARS-CoV-2 by FDA under an Emergency Use Authorization (EUA). This EUA will remain in effect (meaning this test can be used) for the duration of the COVID-19 declaration under Section 564(b)(1) of the Act, 21 U.S.C. section 360bbb-3(b)(1), unless the authorization is terminated or revoked.     Resp Syncytial Virus by PCR NEGATIVE NEGATIVE Final    Comment: (NOTE) Fact Sheet for  Patients: BloggerCourse.com  Fact Sheet for Healthcare Providers: SeriousBroker.it  This test is not yet approved or cleared by the [place]  FDA and has been authorized for detection and/or diagnosis of SARS-CoV-2 by FDA under an Emergency Use Authorization (EUA). This EUA will remain in effect (meaning this test can be used) for the duration of the COVID-19 declaration under Section 564(b)(1) of the Act, 21 U.S.C. section 360bbb-3(b)(1), unless the authorization is terminated or revoked.  Performed at [facility], [address] [place], [place] [address]   Blood Culture (routine x 2)     Status: None   Collection Time: 05/22/24  9:28 PM   Specimen: Right Antecubital; Blood  Result Value Ref Range Status   Specimen Description RIGHT ANTECUBITAL  Final   Special Requests   Final    BOTTLES DRAWN AEROBIC AND ANAEROBIC Blood Culture adequate volume   Culture   Final    NO GROWTH 5 DAYS Performed at [facility], [address] [place], [place] [address]    Report Status 05/27/2024 FINAL  Final  Blood Culture (routine x 2)     Status: None   Collection Time: 05/22/24  9:33 PM   Specimen: BLOOD  Result Value Ref Range Status   Specimen Description BLOOD BLOOD RIGHT ARM  Final   Special Requests   Final    BOTTLES DRAWN AEROBIC AND ANAEROBIC Blood Culture results may not be optimal due to an inadequate volume of blood received in culture bottles   Culture   Final    NO GROWTH 5 DAYS Performed at [facility], [address], [place], [place] [address]    Report Status 05/27/2024 FINAL  Final    Radiology Studies: CT ABDOMEN PELVIS WO CONTRAST Result Date: 05/28/2024 CLINICAL DATA:  Left lower quadrant abdominal pain.  Leukocytosis. EXAM: CT ABDOMEN AND PELVIS WITHOUT CONTRAST TECHNIQUE: Multidetector CT imaging of the abdomen and pelvis was performed following the standard protocol without IV  contrast. RADIATION DOSE REDUCTION: This exam was performed according to the departmental dose-optimization program which includes automated exposure control, adjustment of the mA and/or kV according to patient size and/or use of iterative reconstruction technique. COMPARISON:  10/14/2019 FINDINGS: Lower chest: 5 mm nodular structure in the medial right lower lobe  on image 5/4 may be vascular but appears stable since 2021. No acute abnormality in the lung bases. Hepatobiliary: High-density material at the base of the gallbladder is suggestive for stones or sludge. No evidence for gallbladder inflammation. Normal appearance of the liver. Pancreas: Unremarkable. No pancreatic ductal dilatation or surrounding inflammatory changes. Spleen: Normal in size without focal abnormality. Adrenals/Urinary Tract: Normal appearance of the adrenal glands. Mild perinephric stranding bilaterally. Negative for stones or hydronephrosis. 1.0 cm hyperdense structure along the periphery of the right kidney measures up to 79 Hounsfield units and likely represents a benign etiology. Moderate distention of the urinary bladder. Stomach/Bowel: New stranding around the rectum and presacral region. Rectum is moderately distended with stool. There are some concentric areas of gas along the anterior aspect of the rectum lumen. Suspect this is within the lumen rather than within the rectal wall. Findings are concerning for stercoral proctitis. Moderate amount of stool throughout the remainder of the colon. No evidence for bowel dilatation or obstruction. Small hiatal hernia. Vascular/Lymphatic: Aortic atherosclerosis. No enlarged abdominal or pelvic lymph nodes. Reproductive: Status post hysterectomy. No adnexal masses. Other: Negative for free fluid. Negative for free air. Small amount of subcutaneous gas in the lateral left abdomen is probably from an injection site. Small umbilical hernia containing fat. Musculoskeletal: Old fracture involving  the medial right tenth rib. Multilevel degenerative disc disease in the lumbar spine. Multilevel degenerative facet disease. IMPRESSION: 1. New stranding around the rectum and presacral region. Findings are concerning for stercoral proctitis. 2. Moderate amount of stool throughout the colon. 3. Cholelithiasis without evidence for gallbladder inflammation. 4. Aortic Atherosclerosis (ICD10-I70.0). Electronically Signed   By: Juliene Balder M.D.   On: 05/28/2024 17:01   Scheduled Meds:  amLODipine   5 mg Oral Daily   ARIPiprazole   2 mg Oral QPM   ascorbic acid   500 mg Oral BID   aspirin  EC  81 mg Oral Daily   bisacodyl   10 mg Oral QHS   bisacodyl   10 mg Rectal Once   buPROPion   150 mg Oral Daily   cholecalciferol   2,000 Units Oral BID   dapagliflozin  propanediol  10 mg Oral q morning   enoxaparin  (LOVENOX ) injection  0.5 mg/kg Subcutaneous Q24H   ezetimibe   10 mg Oral Daily   furosemide   20 mg Oral Daily   icosapent  Ethyl  2 g Oral BID   insulin  aspart  0-20 Units Subcutaneous TID WC   insulin  glargine  35 Units Subcutaneous Q24H   metoprolol  tartrate  12.5 mg Oral BID   polyethylene glycol  17 g Oral BID   pregabalin   150 mg Oral BID   rosuvastatin   40 mg Oral Daily   sertraline   150 mg Oral Daily   traZODone   150 mg Oral QHS   Continuous Infusions:  piperacillin -tazobactam (ZOSYN )  IV 3.375 g (05/30/24 0504)     LOS: 0 days    Time spent: 50 mins    Darcel Dawley, MD [facility]   If 7PM-7AM, please contact night-coverage

## 2024-05-30 NOTE — TOC Progression Note (Signed)
 Transition of Care [facility]) - Progression Note    Patient Details  Name: Summer [name] MRN: [id number] Date of Birth: [birth year]  Transition of Care [facility]) CM/SW Contact  Corean ONEIDA Haddock, RN Phone Number: 05/30/2024, 1:28 PM  Clinical Narrative:      Plan remains for patient to return home with home health services through [facility], when medically appropriate as she declined SNF.  BSC as already been delivered to room                   Expected Discharge Plan and Services         Expected Discharge Date: 05/27/24                                     Social Drivers of Health (SDOH) Interventions SDOH Screenings   Food Insecurity: No Food Insecurity (05/23/2024)  Housing: Unknown (05/23/2024)  Transportation Needs: No Transportation Needs (05/23/2024)  Utilities: Not At Risk (05/23/2024)  Alcohol Screen: Low Risk  (08/30/2021)  Depression (PHQ2-9): Medium Risk (04/04/2024)  Financial Resource Strain: Low Risk  (05/14/2024)   Received from [facility]  Physical Activity: Inactive (08/30/2021)  Social Connections: Moderately Isolated (05/23/2024)  Stress: No Stress Concern Present (08/30/2021)  Tobacco Use: Low Risk  (05/19/2024)    Readmission Risk Interventions    05/23/2024    4:54 PM 05/02/2023    2:39 PM  Readmission Risk Prevention Plan  Transportation Screening Complete Complete  PCP or Specialist Appt within 3-5 Days Complete Complete  HRI or Home Care Consult  Complete  Social Work Consult for Recovery Care Planning/Counseling Complete Complete  Palliative Care Screening Not Applicable Not Applicable  Medication Review Oceanographer) Complete Referral to Pharmacy

## 2024-05-30 NOTE — Plan of Care (Signed)
 Pt able to walk and work with physical therapy today, less bowel movements, more mobile.     Problem: Fluid Volume: Goal: Hemodynamic stability will improve Outcome: Progressing   Problem: Clinical Measurements: Goal: Diagnostic test results will improve Outcome: Progressing Goal: Signs and symptoms of infection will decrease Outcome: Progressing   Problem: Respiratory: Goal: Ability to maintain adequate ventilation will improve Outcome: Progressing   Problem: Education: Goal: Ability to describe self-care measures that may prevent or decrease complications (Diabetes Survival Skills Education) will improve Outcome: Progressing Goal: Individualized Educational Video(s) Outcome: Progressing   Problem: Coping: Goal: Ability to adjust to condition or change in health will improve Outcome: Progressing   Problem: Fluid Volume: Goal: Ability to maintain a balanced intake and output will improve Outcome: Progressing   Problem: Health Behavior/Discharge Planning: Goal: Ability to identify and utilize available resources and services will improve Outcome: Progressing Goal: Ability to manage health-related needs will improve Outcome: Progressing   Problem: Metabolic: Goal: Ability to maintain appropriate glucose levels will improve Outcome: Progressing   Problem: Nutritional: Goal: Maintenance of adequate nutrition will improve Outcome: Progressing Goal: Progress toward achieving an optimal weight will improve Outcome: Progressing   Problem: Skin Integrity: Goal: Risk for impaired skin integrity will decrease Outcome: Progressing   Problem: Tissue Perfusion: Goal: Adequacy of tissue perfusion will improve Outcome: Progressing   Problem: Education: Goal: Knowledge of General Education information will improve Description: Including pain rating scale, medication(s)/side effects and non-pharmacologic comfort measures Outcome: Progressing   Problem: Health  Behavior/Discharge Planning: Goal: Ability to manage health-related needs will improve Outcome: Progressing   Problem: Clinical Measurements: Goal: Ability to maintain clinical measurements within normal limits will improve Outcome: Progressing Goal: Will remain free from infection Outcome: Progressing Goal: Diagnostic test results will improve Outcome: Progressing Goal: Respiratory complications will improve Outcome: Progressing Goal: Cardiovascular complication will be avoided Outcome: Progressing   Problem: Activity: Goal: Risk for activity intolerance will decrease Outcome: Progressing   Problem: Nutrition: Goal: Adequate nutrition will be maintained Outcome: Progressing   Problem: Coping: Goal: Level of anxiety will decrease Outcome: Progressing   Problem: Elimination: Goal: Will not experience complications related to bowel motility Outcome: Progressing Goal: Will not experience complications related to urinary retention Outcome: Progressing   Problem: Pain Managment: Goal: General experience of comfort will improve and/or be controlled Outcome: Progressing   Problem: Safety: Goal: Ability to remain free from injury will improve Outcome: Progressing   Problem: Skin Integrity: Goal: Risk for impaired skin integrity will decrease Outcome: Progressing

## 2024-05-30 NOTE — Inpatient Diabetes Management (Signed)
 Inpatient Diabetes Program Recommendations  AACE/ADA: New Consensus Statement on Inpatient Glycemic Control (2015)  Target Ranges:  Prepandial:   less than 140 mg/dL      Peak postprandial:   less than 180 mg/dL (1-2 hours)      Critically ill patients:  140 - 180 mg/dL    Latest Reference Range & Units 05/29/24 08:19 05/29/24 12:12 05/29/24 17:33 05/29/24 21:18  Glucose-Capillary 70 - 99 mg/dL 777 (H)  7 units Novolog   242 (H)  7 units Novolog   256 (H)  11 units Novolog   249 (H)   30 units Lantus   (H): Data is abnormally high  Latest Reference Range & Units 05/30/24 08:01  Glucose-Capillary 70 - 99 mg/dL 797 (H)  7 units Novolog    (H): Data is abnormally high  Home DM Meds: Freestyle Libre 3 CGM                             Farxiga  10 mg Daily                             Novolog  14 units daily                             Tresiba 85 units daily                             Ozempic  1 mg Qweek                                Current Orders: Lantus  30 units Q24H                           Novolog  Resistant Correction Scale/ SSI (0-20 units) TID AC                            Farxiga  10 mg daily       MD- Please consider:  1. Increase the Lantus  to 35 units Q24H  2. Looks like pt did not eat well yesterday--Bites only.  May consider increasing the frequency of the Novolog  SSI to Q4H while PO intake poor    --Will follow patient during hospitalization--  Adina Rudolpho Arrow RN, MSN, CDCES Diabetes Coordinator Inpatient Glycemic Control Team Team Pager: [phone] (8a-5p)

## 2024-05-30 NOTE — Progress Notes (Signed)
 Occupational Therapy Treatment Patient Details Name: Summer [name] MRN: [id number] DOB: 1951-03-02 Today's Date: 05/30/2024   History of present illness [name] is a 73 y.o. female with multiple medical problems including coronary artery disease, congestive heart failure, hyperlipidemia, hypertension, hypothyroidism, obesity, COPD, diabetes, chronic kidney disease, and sleep apnea, now status post a left total knee arthroplasty on 05/19/24 who presents to the ED 4 days post op with UTI.   OT comments  Pt seen for OT treatment on this date. Upon arrival to room pt supine in bed, easily awakes to voice, agreeable to tx. Pt requires MINA for bed mobility with HHA to come to sitting on the EOB. Pt STS from EOB with CGA from slightly elevated bed height. Pt amb into BR with all CGA + use of RW. Pt required MODA for LB dressing and completed pericare with CGA via lateral leaning. Pt returned to bed with all needs in reach. Pt making good progress toward goals, will continue to follow POC. Discharge recommendation remains appropriate.        If plan is discharge home, recommend the following:  A lot of help with bathing/dressing/bathroom;Assistance with cooking/housework;A little help with walking and/or transfers;Assist for transportation;Help with stairs or ramp for entrance   Equipment Recommendations  BSC/3in1          Precautions / Restrictions Precautions Precautions: Knee Precaution Booklet Issued: Yes (comment) Recall of Precautions/Restrictions: Intact Restrictions Weight Bearing Restrictions Per Provider Order: Yes LLE Weight Bearing Per Provider Order: Weight bearing as tolerated       Mobility Bed Mobility Overal bed mobility: Needs Assistance Bed Mobility: Supine to Sit, Sit to Supine     Supine to sit: Min assist Sit to supine: Supervision        Transfers Overall transfer level: Needs assistance Equipment used: Rolling walker (2 wheels) Transfers: Sit to/from  Stand Sit to Stand: Contact guard assist            Balance Overall balance assessment: Needs assistance Sitting-balance support: No upper extremity supported, Feet supported Sitting balance-Leahy Scale: Good     Standing balance support: Bilateral upper extremity supported Standing balance-Leahy Scale: Fair Standing balance comment: Heavy reliance on RW for support, very effortful throughout mobility                           ADL either performed or assessed with clinical judgement   ADL Overall ADL's : Needs assistance/impaired Eating/Feeding: Set up;Sitting                   Lower Body Dressing: Moderate assistance;Sit to/from stand   Toilet Transfer: Contact guard assist;Cueing for sequencing                   Communication Communication Communication: No apparent difficulties   Cognition Arousal: Alert Behavior During Therapy: WFL for tasks assessed/performed               OT - Cognition Comments: A/Ox4, limited safety awareness                 Following commands: Intact        Cueing   Cueing Techniques: Verbal cues, Tactile cues  Exercises Exercises: Other exercises Other Exercises Other Exercises: Edu: Role of OT session, safe ADL completion, benefits of continued OOB activity           General Comments Pt calm and cooperative throughout session  Pertinent Vitals/ Pain       Pain Assessment Pain Assessment: No/denies pain                                                          Frequency  Min 2X/week        Progress Toward Goals  OT Goals(current goals can now be found in the care plan section)  Progress towards OT goals: Progressing toward goals  Acute Rehab OT Goals OT Goal Formulation: With patient Time For Goal Achievement: 06/07/24 Potential to Achieve Goals: Good  Plan      Co-evaluation                 AM-PAC OT 6 Clicks Daily Activity     Outcome  Measure   Help from another person eating meals?: None Help from another person taking care of personal grooming?: A Little Help from another person toileting, which includes using toliet, bedpan, or urinal?: A Little Help from another person bathing (including washing, rinsing, drying)?: A Lot Help from another person to put on and taking off regular upper body clothing?: A Little Help from another person to put on and taking off regular lower body clothing?: A Lot 6 Click Score: 17    End of Session Equipment Utilized During Treatment: Gait belt;Rolling walker (2 wheels)  OT Visit Diagnosis: Other abnormalities of gait and mobility (R26.89);Pain Pain - Right/Left: Left Pain - part of body: Knee   Activity Tolerance Patient tolerated treatment well   Patient Left in bed;with call bell/phone within reach;with bed alarm set   Nurse Communication Mobility status;Other (comment) ([employer] doffed, pt with call bell for letting RN staff know when she needs to use BR)        Time: 8476-8451 OT Time Calculation (min): 25 min  Charges: OT General Charges $OT Visit: 1 Visit OT Treatments $Self Care/Home Management : 23-37 mins  Larraine Colas M.S. OTR/L  05/30/24, 4:35 PM

## 2024-05-30 NOTE — Progress Notes (Signed)
 Physical Therapy Treatment Patient Details Name: Summer [name] MRN: [id number] DOB: 1950-11-22 Today's Date: 05/30/2024   History of Present Illness Summer [name] is a 73 y.o. female with multiple medical problems including coronary artery disease, congestive heart failure, hyperlipidemia, hypertension, hypothyroidism, obesity, COPD, diabetes, chronic kidney disease, and sleep apnea, now status post a left total knee arthroplasty on 05/19/24 who presents to the ED 4 days post op with UTI.    PT Comments  Pt was supine in bed upon arrival. Reviewed need to keep knee straight when not OOB. Pt continues to have knee portion of bed elevated. Recent TKA. Pt was agreeable to session and endorses confidence in Dcing home when cleared. Im going home tomorrow. Pt was able to ambulate slightly improved distances however still remains overall limited by pain and fatigue. Pt was agreeable to HHPT at DC. Author encouraged pt to continue to progress OOB and routine mobility to maximize her independence and safety with all ADLs    If plan is discharge home, recommend the following: A little help with bathing/dressing/bathroom;Assistance with cooking/housework;Assist for transportation;Help with stairs or ramp for entrance;A lot of help with walking and/or transfers     Equipment Recommendations  Rolling walker (2 wheels)       Precautions / Restrictions Precautions Precautions: Knee Precaution Booklet Issued: Yes (comment) Recall of Precautions/Restrictions: Intact Restrictions Weight Bearing Restrictions Per Provider Order: Yes LLE Weight Bearing Per Provider Order: Weight bearing as tolerated     Mobility  Bed Mobility Overal bed mobility: Needs Assistance Bed Mobility: Supine to Sit, Sit to Supine  Supine to sit: Min assist Sit to supine: Supervision   Transfers Overall transfer level: Needs assistance Equipment used: Rolling walker (2 wheels) Transfers: Sit to/from Stand Sit to Stand: Contact  guard assist  General transfer comment: Pt was able to stand form BSC and EOB surface with CGA for safety. vcs for improved technique and fwd wt shift.    Ambulation/Gait Ambulation/Gait assistance: Contact guard assist Gait Distance (Feet): 50 Feet Assistive device: Rolling walker (2 wheels) Gait Pattern/deviations: Step-through pattern Gait velocity: decreased  General Gait Details: Pt was able to ambulate ~ 50 ft total but does endorse fatigue and is unable to ambulate further distances. per pt, This is about as far as I walk at home.    Balance Overall balance assessment: Needs assistance Sitting-balance support: No upper extremity supported, Feet supported Sitting balance-Leahy Scale: Good     Standing balance support: Bilateral upper extremity supported Standing balance-Leahy Scale: Fair Standing balance comment: reliant on RW for support        Communication Communication Communication: No apparent difficulties  Cognition Arousal: Alert Behavior During Therapy: WFL for tasks assessed/performed   PT - Cognitive impairments: No apparent impairments    Following commands: Intact      Cueing Cueing Techniques: Verbal cues, Tactile cues         Pertinent Vitals/Pain Pain Assessment Pain Assessment: 0-10 Pain Score: 4  Pain Location: groin pain Pain Descriptors / Indicators: Sore, Aching Pain Intervention(s): Limited activity within patient's tolerance, Monitored during session, Premedicated before session, Repositioned     PT Goals (current goals can now be found in the care plan section) Acute Rehab PT Goals Patient Stated Goal: go home Progress towards PT goals: Progressing toward goals    Frequency    7X/week       AM-PAC PT 6 Clicks Mobility   Outcome Measure  Help needed turning from your back to your side  while in a flat bed without using bedrails?: A Little Help needed moving from lying on your back to sitting on the side of a flat bed  without using bedrails?: A Little Help needed moving to and from a bed to a chair (including a wheelchair)?: A Little Help needed standing up from a chair using your arms (e.g., wheelchair or bedside chair)?: A Little Help needed to walk in hospital room?: A Little Help needed climbing 3-5 steps with a railing? : A Lot 6 Click Score: 17    End of Session   Activity Tolerance: Patient tolerated treatment well;Other (comment) Patient left: in bed;with call bell/phone within reach;with bed alarm set Nurse Communication: Mobility status PT Visit Diagnosis: Muscle weakness (generalized) (M62.81);Other abnormalities of gait and mobility (R26.89);Difficulty in walking, not elsewhere classified (R26.2);Pain Pain - Right/Left: Left Pain - part of body: Knee     Time: 8652-8586 PT Time Calculation (min) (ACUTE ONLY): 26 min  Charges:    $Gait Training: 8-22 mins $Therapeutic Activity: 8-22 mins PT General Charges $$ ACUTE PT VISIT: 1 Visit                    Rankin Essex PTA 05/30/24, 2:37 PM

## 2024-05-31 DIAGNOSIS — A4150 Gram-negative sepsis, unspecified: Secondary | ICD-10-CM | POA: Diagnosis not present

## 2024-05-31 DIAGNOSIS — N390 Urinary tract infection, site not specified: Secondary | ICD-10-CM | POA: Diagnosis not present

## 2024-05-31 LAB — CBC
HCT: 37.9 % (ref 36.0–46.0)
Hemoglobin: 12.5 g/dL (ref 12.0–15.0)
MCH: 28.9 pg (ref 26.0–34.0)
MCHC: 33 g/dL (ref 30.0–36.0)
MCV: 87.5 fL (ref 80.0–100.0)
Platelets: 427 K/uL — ABNORMAL HIGH (ref 150–400)
RBC: 4.33 MIL/uL (ref 3.87–5.11)
RDW: 14 % (ref 11.5–15.5)
WBC: 16 K/uL — ABNORMAL HIGH (ref 4.0–10.5)
nRBC: 0 % (ref 0.0–0.2)

## 2024-05-31 LAB — BASIC METABOLIC PANEL WITH GFR
Anion gap: 13 (ref 5–15)
BUN: 48 mg/dL — ABNORMAL HIGH (ref 8–23)
CO2: 26 mmol/L (ref 22–32)
Calcium: 9.5 mg/dL (ref 8.9–10.3)
Chloride: 97 mmol/L — ABNORMAL LOW (ref 98–111)
Creatinine, Ser: 1.82 mg/dL — ABNORMAL HIGH (ref 0.44–1.00)
GFR, Estimated: 29 mL/min — ABNORMAL LOW (ref >60)
Glucose, Bld: 186 mg/dL — ABNORMAL HIGH (ref 70–99)
Potassium: 3.7 mmol/L (ref 3.5–5.1)
Sodium: 136 mmol/L (ref 135–145)

## 2024-05-31 LAB — GLUCOSE, CAPILLARY
Glucose-Capillary: 230 mg/dL — ABNORMAL HIGH (ref 70–99)
Glucose-Capillary: 310 mg/dL — ABNORMAL HIGH (ref 70–99)

## 2024-05-31 LAB — PHOSPHORUS: Phosphorus: 4 mg/dL (ref 2.5–4.6)

## 2024-05-31 LAB — MAGNESIUM: Magnesium: 2.6 mg/dL — ABNORMAL HIGH (ref 1.7–2.4)

## 2024-05-31 MED ORDER — METOPROLOL TARTRATE 25 MG PO TABS: 12.5000 mg | ORAL_TABLET | Freq: Two times a day (BID) | ORAL | 0 refills | Status: DC

## 2024-05-31 MED ORDER — BISACODYL 5 MG PO TBEC: 10.0000 mg | DELAYED_RELEASE_TABLET | Freq: Every day | ORAL | 0 refills | Status: DC

## 2024-05-31 MED ORDER — AMOXICILLIN-POT CLAVULANATE 875-125 MG PO TABS: 1.0000 | ORAL_TABLET | Freq: Two times a day (BID) | ORAL | 0 refills | Status: DC

## 2024-05-31 MED ORDER — AMOXICILLIN-POT CLAVULANATE 875-125 MG PO TABS: 1.0000 | ORAL_TABLET | Freq: Two times a day (BID) | ORAL | 0 refills | Status: AC

## 2024-05-31 MED ORDER — POLYETHYLENE GLYCOL 3350 17 G PO PACK: 17.0000 g | PACK | Freq: Two times a day (BID) | ORAL | 0 refills | Status: AC

## 2024-05-31 NOTE — Discharge Summary (Signed)
 Physician Discharge Summary  Summer [name] FMW:[id number] DOB: [birth year] DOA: 05/22/2024  PCP: Sowles, Krichna, MD  Admit date: 05/22/2024  Discharge date: 05/31/2024  Admitted From: Home  Disposition:  Home Health services  Recommendations for Outpatient Follow-up:  Follow up with PCP in 1-2 weeks. Please obtain BMP/CBC in one week. Advised to take Augmentin  twice a day for 4 more days for a stercoral colitis. Advised to drink more fluids.  Advised high-fiber diet.  Home Health: Home PT/OT Equipment/Devices: None  Discharge Condition: Stable CODE STATUS:Full code Diet recommendation: High Fiber diet  Brief Summary/ Hospital Course: This 73 yrs old female with PMH significant for HFpEF, asthma, COPD, Coronary artery disease, stage IV CKD, type 2 diabetes mellitus and PAD, depression and GERD, who presented to the ED with acute onset of altered mental status with confusion.  Patient reports urinary frequency, urgency, dysuria without hematuria or flank pain.  Also reports chills but did not report any fever.  She has left total knee arthroplasty on 05/19/2024 and has been taking pain medication so far.She has been very confused over last few days. She was hypertensive and tachycardic in the ED. Labs revealed hyperglycemia of 271 and a CO2 of 18 with anion gap of 19 however VBG showed HCO3 of 23 and pH of 7.39. UA+ for UTI.  Patient was admitted for further evaluation, started on empiric antibiotics.  Patient was continued on IV antibiotics for sepsis due to UTI.  She has completed ceftriaxone  for 3 days. Blood cultures no growth to date, urine cultures grew multiple species.  Renal ultrasound no hydronephrosis or nephrolithiasis.  Patient has significant leukocytosis. CT abdomen and pelvis shows stercoral proctitis.  Patient was started on IV Zosyn .  Patient had regular bowel movements.  White cell count has improved.  Renal functions were at baseline.  Patient was evaluated by orthopedics  recommended resume formal physical therapy while in hospital , weightbearing as tolerated.  Patient feels better and wants to be discharged.  PT and OT recommended SNF the patient has made improvement subsequently patient being discharged to home with home health services.  Patient being discharged on Augmentin  for 4 days for stercoral colitis.  Discharge Diagnoses:  Principal Problem:   Sepsis due to gram-negative UTI ([name]) Active Problems:   Uncontrolled type 2 diabetes mellitus with hyperglycemia, with long-term current use of insulin  ([name])   Dyslipidemia   Essential hypertension   (HFpEF) heart failure with preserved ejection fraction ([name])   MDD (major depressive disorder)   GERD without esophagitis   Stercoral colitis  Sepsis due to UTI : She presented with multiple complaints (urinary frequency, urgency, chills, nausea, abdominal discomfort.) Sepsis is manifested by tachycardia, tachypnea and leukocytosis. Received IV Rocephin  x 3 days till 05/24/24 Blood culture NGTD and urine cultures grew multiple species, recommended recollection. US  renal: No hydronephrosis or nephrolithiasis. Hepatic steatosis. Continue IV fluid resuscitation.  Sepsis physiology improved.   Stercoral proctitis: Significant Leukocytosis: CT abdomen/ pelvis reviewed, She reports abdominal pain improved. WBC 19.6 >> 22.6 > 14.1. Continued  IV Zosyn  therapy.  Antibiotic changed with Augmentin  for 4 days. She has bowel movement.  Abdominal pain improved.  Advised high-fiber diet. White cell count is improving.   CKD stage IIIa Serum creatinine at baseline. Kidney function stable. Monitor daily renal function. Avoid nephrotoxic drugs.   Hypophosphatemia: Likely due to nutritional deficiency: Replaced.  Continue to monitor.   Uncontrolled type 2 diabetes mellitus with hyperglycemia, with long-term current use of insulin : Hb A1C 7.7. Continue  supplemental coverage with NovoLog ,  Increase Lantus  35 units  qd.   Dyslipidemia:  Continue statin and Zetia .   Essential hypertension: Decreased Lopressor  12.5 mg twice daily with holding parameter. Norvasc  resumed as BP improved. Monitor BP and titrate medications accordingly.   GERD without esophagitis:   Continue PPI.   MDD (major depressive disorder): Continue Wellbutrin  XL and Zoloft  as well as Abilify .   (HFpEF) heart failure with preserved ejection fraction Continue Lasix  and Farxiga  as well as beta-blocker therapy.   S/p total left knee replacement done on 05/19/24: Orthopedic surgery team does not feel any signs of infection, recommended to resume formal physical therapy while in the hospital, weightbearing as tolerated on the left leg. She also was advised to ask one of her family members to bring in her Polar Care device so that ice therapy may be continued during this hospitalization.   Continue PT and OT  Discharge Instructions  Discharge Instructions     Call MD for:  difficulty breathing, headache or visual disturbances   Complete by: As directed    Call MD for:  difficulty breathing, headache or visual disturbances   Complete by: As directed    Call MD for:  extreme fatigue   Complete by: As directed    Call MD for:  persistant dizziness or light-headedness   Complete by: As directed    Call MD for:  persistant dizziness or light-headedness   Complete by: As directed    Call MD for:  persistant nausea and vomiting   Complete by: As directed    Call MD for:  persistant nausea and vomiting   Complete by: As directed    Call MD for:  redness, tenderness, or signs of infection (pain, swelling, redness, odor or green/yellow discharge around incision site)   Complete by: As directed    Call MD for:  severe uncontrolled pain   Complete by: As directed    Call MD for:  temperature >100.4   Complete by: As directed    Diet - low sodium heart healthy   Complete by: As directed    Diet - low sodium heart healthy   Complete  by: As directed    Diet Carb Modified   Complete by: As directed    Diet general   Complete by: As directed    Discharge instructions   Complete by: As directed    Follow-up with PCP in 1 week Follow-up with orthopedic surgery in 1 week   Discharge instructions   Complete by: As directed    Advised to follow-up with primary care physician in 1 week. Advised to take Augmentin  twice a day for 4 more days for a stercoral colitis. Advised to drink more fluids.  Advise high-fiber diet.   Increase activity slowly   Complete by: As directed    Increase activity slowly   Complete by: As directed    No wound care   Complete by: As directed    No wound care   Complete by: As directed       Allergies as of 05/31/2024       Reactions   Codeine Other (See Comments)   TRIPPED OUT  DIDN'T LIKE THE WAY IT FELT   Atorvastatin    muscle pain   Hydrocodone     itching   Tramadol     Latex Rash   Zolpidem  Other (See Comments)   Sleep walk        Medication List     TAKE  these medications    acetaminophen  500 MG tablet Commonly known as: TYLENOL  Take 2 tablets (1,000 mg total) by mouth every 8 (eight) hours.   ALBUTEROL  IN Inhale 2 puffs into the lungs as needed.   amLODipine  5 MG tablet Commonly known as: NORVASC  Take 1 tablet (5 mg total) by mouth daily.   amoxicillin -clavulanate 875-125 MG tablet Commonly known as: AUGMENTIN  Take 1 tablet by mouth 2 (two) times daily for 4 days.   ARIPiprazole  2 MG tablet Commonly known as: ABILIFY  Take 1 tablet (2 mg total) by mouth every evening.   ASPIRIN  LOW DOSE 81 MG tablet Generic drug: aspirin  EC TAKE 1 TABLET (81 MG TOTAL) BY MOUTH DAILY.   B-D ULTRAFINE III SHORT PEN 31G X 8 MM Misc Generic drug: Insulin  Pen Needle   bisacodyl  5 MG EC tablet Commonly known as: DULCOLAX Take 2 tablets (10 mg total) by mouth at bedtime.   buPROPion  150 MG 24 hr tablet Commonly known as: WELLBUTRIN  XL Take 1 tablet (150 mg total) by  mouth daily.   chlorhexidine  4 % external liquid Commonly known as: HIBICLENS  Apply 15 mLs (1 Application total) topically as directed for 30 doses. Use as directed daily for 5 days every other week for 6 weeks.   CULTURELLE DIGESTIVE DAILY PO Take 15 Billion Cells by mouth daily.   docusate sodium  100 MG capsule Commonly known as: COLACE Take 1 capsule (100 mg total) by mouth 2 (two) times daily.   enoxaparin  40 MG/0.4ML injection Commonly known as: LOVENOX  Inject 0.4 mLs (40 mg total) into the skin daily for 14 days.   ezetimibe  10 MG tablet Commonly known as: ZETIA  Take 1 tablet (10 mg total) by mouth daily.   Farxiga  10 MG Tabs tablet Generic drug: dapagliflozin  propanediol Take 1 tablet (10 mg total) by mouth every morning.   FreeStyle Libre 2 Sensor Misc FOR GLUCOSE MONITORING   furosemide  20 MG tablet Commonly known as: LASIX  Take 1 tablet (20 mg total) by mouth daily.   metoprolol  tartrate 25 MG tablet Commonly known as: LOPRESSOR  Take 0.5 tablets (12.5 mg total) by mouth 2 (two) times daily. What changed:  medication strength how much to take   mupirocin  ointment 2 % Commonly known as: BACTROBAN  Place 1 Application into the nose 2 (two) times daily for 60 doses. Use as directed 2 times daily for 5 days every other week for 6 weeks.   NovoLOG  FlexPen 100 UNIT/ML FlexPen Generic drug: insulin  aspart Inject 14 Units into the skin in the morning and at bedtime. What changed: when to take this   omeprazole  40 MG capsule Commonly known as: PRILOSEC Take 1 capsule (40 mg total) by mouth daily.   ondansetron  4 MG tablet Commonly known as: ZOFRAN  Take 1 tablet (4 mg total) by mouth every 6 (six) hours as needed for nausea.   OSTEO BI-FLEX ADV JOINT SHIELD PO Take by mouth 2 (two) times daily. What changed: when to take this   OSTEO BI-FLEX ONE PER DAY PO Take 1 capsule by mouth every morning.   oxyCODONE  5 MG immediate release tablet Commonly known as:  Oxy IR/ROXICODONE  Take 0.5-1 tablets (2.5-5 mg total) by mouth every 4 (four) hours as needed for moderate pain (pain score 4-6).   OZEMPIC  (1 MG/DOSE) [place] Inject into the skin once a week. On thursday   polyethylene glycol 17 g packet Commonly known as: MIRALAX  / GLYCOLAX  Take 17 g by mouth 2 (two) times daily.   pregabalin  150 MG  capsule Commonly known as: LYRICA  Take 1 capsule (150 mg total) by mouth 3 (three) times daily. What changed: when to take this   psyllium 58.6 % powder Commonly known as: METAMUCIL Take 1 packet by mouth daily.   rosuvastatin  40 MG tablet Commonly known as: CRESTOR  Take 1 tablet (40 mg total) by mouth daily.   sertraline  100 MG tablet Commonly known as: ZOLOFT  Take 1.5 tablets (150 mg total) by mouth daily.   traZODone  100 MG tablet Commonly known as: DESYREL  Take 1 tablet (100 mg total) by mouth at bedtime as needed. for sleep What changed:  how much to take when to take this   Tresiba FlexTouch 200 UNIT/ML FlexTouch Pen Generic drug: insulin  degludec 85 Units daily. In morning   Vascepa  1 g capsule Generic drug: icosapent  Ethyl Take 2 capsules (2 g total) by mouth 2 (two) times daily.   Vitamin D3 25 MCG (1000 UT) Caps Take 1,000 Units by mouth daily. What changed:  how much to take when to take this               Durable Medical Equipment  (From admission, onward)           Start     Ordered   05/26/24 1340  For home use only DME Bedside commode  Once       Question:  Patient needs a bedside commode to treat with the following condition  Answer:  Weakness   05/26/24 1340            Follow-up Information     Sowles, Krichna, MD Follow up on 06/02/2024.   Specialty: Family Medicine Why: Go at 10:00am. Contact information: [address] [place] [address] [phone]                Allergies  Allergen Reactions   Codeine Other (See Comments)    TRIPPED OUT  DIDN'T LIKE THE WAY  IT FELT   Atorvastatin     muscle pain   Hydrocodone      itching   Tramadol     Latex Rash   Zolpidem  Other (See Comments)    Sleep walk    Consultations: Orthopedics   Procedures/Studies: CT ABDOMEN PELVIS WO CONTRAST Result Date: 05/28/2024 CLINICAL DATA:  Left lower quadrant abdominal pain.  Leukocytosis. EXAM: CT ABDOMEN AND PELVIS WITHOUT CONTRAST TECHNIQUE: Multidetector CT imaging of the abdomen and pelvis was performed following the standard protocol without IV contrast. RADIATION DOSE REDUCTION: This exam was performed according to the departmental dose-optimization program which includes automated exposure control, adjustment of the mA and/or kV according to patient size and/or use of iterative reconstruction technique. COMPARISON:  10/14/2019 FINDINGS: Lower chest: 5 mm nodular structure in the medial right lower lobe on image 5/4 may be vascular but appears stable since 2021. No acute abnormality in the lung bases. Hepatobiliary: High-density material at the base of the gallbladder is suggestive for stones or sludge. No evidence for gallbladder inflammation. Normal appearance of the liver. Pancreas: Unremarkable. No pancreatic ductal dilatation or surrounding inflammatory changes. Spleen: Normal in size without focal abnormality. Adrenals/Urinary Tract: Normal appearance of the adrenal glands. Mild perinephric stranding bilaterally. Negative for stones or hydronephrosis. 1.0 cm hyperdense structure along the periphery of the right kidney measures up to 79 Hounsfield units and likely represents a benign etiology. Moderate distention of the urinary bladder. Stomach/Bowel: New stranding around the rectum and presacral region. Rectum is moderately distended with stool. There are some concentric areas  of gas along the anterior aspect of the rectum lumen. Suspect this is within the lumen rather than within the rectal wall. Findings are concerning for stercoral proctitis. Moderate amount of  stool throughout the remainder of the colon. No evidence for bowel dilatation or obstruction. Small hiatal hernia. Vascular/Lymphatic: Aortic atherosclerosis. No enlarged abdominal or pelvic lymph nodes. Reproductive: Status post hysterectomy. No adnexal masses. Other: Negative for free fluid. Negative for free air. Small amount of subcutaneous gas in the lateral left abdomen is probably from an injection site. Small umbilical hernia containing fat. Musculoskeletal: Old fracture involving the medial right tenth rib. Multilevel degenerative disc disease in the lumbar spine. Multilevel degenerative facet disease. IMPRESSION: 1. New stranding around the rectum and presacral region. Findings are concerning for stercoral proctitis. 2. Moderate amount of stool throughout the colon. 3. Cholelithiasis without evidence for gallbladder inflammation. 4. Aortic Atherosclerosis (ICD10-I70.0). Electronically Signed   By: Juliene Balder M.D.   On: 05/28/2024 17:01   US  RENAL Result Date: 05/23/2024 CLINICAL DATA:  [id number] Complicated UTI (urinary tract infection) [id number] EXAM: RENAL / URINARY TRACT ULTRASOUND COMPLETE COMPARISON:  10/14/2019 FINDINGS: Right Kidney: Renal measurements: 10.2 x 5.6 x 5.1 cm. Normal echogenicity. No mass. No hydronephrosis or nephrolithiasis. Left Kidney: Renal measurements: 9.6 x 4.6 x 4.9 cm. Normal echogenicity. No mass. No hydronephrosis or nephrolithiasis. Bladder: Appears normal for degree of bladder distention. Other: Hepatic steatosis. IMPRESSION: 1. No hydronephrosis or nephrolithiasis. 2. Hepatic steatosis. Electronically Signed   By: Rogelia Myers M.D.   On: 05/23/2024 16:03   DG Knee Complete 4 Views Left Result Date: 05/23/2024 CLINICAL DATA:  [id number] Status post total knee replacement using cement, left [id number] EXAM: LEFT KNEE - COMPLETE 4+ VIEW COMPARISON:  05/19/2024 FINDINGS: Well-aligned knee arthroplasty with patellar resurfacing without acute fracture.No periprosthetic lucency to  suggest loosening.No dislocation. Expected diffuse subcutaneous edema. No radiopaque foreign body or retained surgical instrument. IMPRESSION: Well-aligned knee arthroplasty without acute fracture or dislocation.No radiopaque foreign body or retained surgical instrument. Electronically Signed   By: Rogelia Myers M.D.   On: 05/23/2024 14:20   DG Knee Left Port Result Date: 05/19/2024 CLINICAL DATA:  Total knee replacement EXAM: PORTABLE LEFT KNEE - 1-2 VIEW COMPARISON:  05/01/2023 FINDINGS: Interval total knee prosthesis placement with resurfacing of the patella expected gas in the joint and soft tissues. No periprosthetic fracture or acute bony findings. IMPRESSION: 1. Interval total knee prosthesis placement without complicating feature. Electronically Signed   By: Ryan Salvage M.D.   On: 05/19/2024 10:51   Subjective: Patient was seen and examined at bedside.  Overnight events noted. Patient reports doing much better and wants to be discharged.  Discharge Exam: Vitals:   05/31/24 0819 05/31/24 0859  BP: (!) 119/49 (!) 119/49  Pulse: 63 63  Resp: 18   Temp: (!) 97.4 F (36.3 C)   SpO2: 99%    Vitals:   05/30/24 1925 05/31/24 0328 05/31/24 0819 05/31/24 0859  BP: (!) 160/50 (!) 129/56 (!) 119/49 (!) 119/49  Pulse: 84 64 63 63  Resp: 20 16 18    Temp: 97.6 F (36.4 C) 97.7 F (36.5 C) (!) 97.4 F (36.3 C)   TempSrc:   Oral   SpO2: 100% 96% 99%     General: Pt is alert, awake, not in acute distress Cardiovascular: RRR, S1/S2 +, no rubs, no gallops Respiratory: CTA bilaterally, no wheezing, no rhonchi Abdominal: Soft, NT, ND, bowel sounds + Extremities: no edema, no cyanosis    The results of  significant diagnostics from this hospitalization (including imaging, microbiology, ancillary and laboratory) are listed below for reference.     Microbiology: Recent Results (from the past 240 hours)  Urine Culture     Status: Abnormal   Collection Time: 05/22/24  8:23 PM    Specimen: Urine, Clean Catch  Result Value Ref Range Status   Specimen Description   Final    URINE, CLEAN CATCH Performed at [facility], [address] [place], [place] [address]    Special Requests   Final    NONE Performed at [facility], [address] [place], [place] [address]    Culture MULTIPLE SPECIES PRESENT, SUGGEST RECOLLECTION (A)  Final   Report Status 05/24/2024 FINAL  Final  Resp panel by RT-PCR (RSV, Flu A&B, Covid) Anterior Nasal Swab     Status: None   Collection Time: 05/22/24  9:28 PM   Specimen: Anterior Nasal Swab  Result Value Ref Range Status   SARS Coronavirus 2 by RT PCR NEGATIVE NEGATIVE Final    Comment: (NOTE) SARS-CoV-2 target nucleic acids are NOT DETECTED.  The SARS-CoV-2 RNA is generally detectable in upper respiratory specimens during the acute phase of infection. The lowest concentration of SARS-CoV-2 viral copies this assay can detect is 138 copies/mL. A negative result does not preclude SARS-Cov-2 infection and should not be used as the sole basis for treatment or other patient management decisions. A negative result may occur with  improper specimen collection/handling, submission of specimen other than nasopharyngeal swab, presence of viral mutation(s) within the areas targeted by this assay, and inadequate number of viral copies(<138 copies/mL). A negative result must be combined with clinical observations, patient history, and epidemiological information. The expected result is Negative.  Fact Sheet for Patients:  BloggerCourse.com  Fact Sheet for Healthcare Providers:  SeriousBroker.it  This test is no t yet approved or cleared by the [place]  FDA and  has been authorized for detection and/or diagnosis of SARS-CoV-2 by FDA under an Emergency Use Authorization (EUA). This EUA will remain  in effect (meaning this test can be used) for the duration of  the COVID-19 declaration under Section 564(b)(1) of the Act, 21 U.S.C.section 360bbb-3(b)(1), unless the authorization is terminated  or revoked sooner.       Influenza A by PCR NEGATIVE NEGATIVE Final   Influenza B by PCR NEGATIVE NEGATIVE Final    Comment: (NOTE) The Xpert Xpress SARS-CoV-2/FLU/RSV plus assay is intended as an aid in the diagnosis of influenza from Nasopharyngeal swab specimens and should not be used as a sole basis for treatment. Nasal washings and aspirates are unacceptable for Xpert Xpress SARS-CoV-2/FLU/RSV testing.  Fact Sheet for Patients: BloggerCourse.com  Fact Sheet for Healthcare Providers: SeriousBroker.it  This test is not yet approved or cleared by the [place]  FDA and has been authorized for detection and/or diagnosis of SARS-CoV-2 by FDA under an Emergency Use Authorization (EUA). This EUA will remain in effect (meaning this test can be used) for the duration of the COVID-19 declaration under Section 564(b)(1) of the Act, 21 U.S.C. section 360bbb-3(b)(1), unless the authorization is terminated or revoked.     Resp Syncytial Virus by PCR NEGATIVE NEGATIVE Final    Comment: (NOTE) Fact Sheet for Patients: BloggerCourse.com  Fact Sheet for Healthcare Providers: SeriousBroker.it  This test is not yet approved or cleared by the [place]  FDA and has been authorized for detection and/or diagnosis of SARS-CoV-2 by FDA under an Emergency Use Authorization (EUA). This EUA will remain  in effect (meaning this test can be used) for the duration of the COVID-19 declaration under Section 564(b)(1) of the Act, 21 U.S.C. section 360bbb-3(b)(1), unless the authorization is terminated or revoked.  Performed at [facility], [address] [place], [place] [address]   Blood Culture (routine x 2)     Status: None   Collection Time:  05/22/24  9:28 PM   Specimen: Right Antecubital; Blood  Result Value Ref Range Status   Specimen Description RIGHT ANTECUBITAL  Final   Special Requests   Final    BOTTLES DRAWN AEROBIC AND ANAEROBIC Blood Culture adequate volume   Culture   Final    NO GROWTH 5 DAYS Performed at [facility], [address], [place], [place] [address]    Report Status 05/27/2024 FINAL  Final  Blood Culture (routine x 2)     Status: None   Collection Time: 05/22/24  9:33 PM   Specimen: BLOOD  Result Value Ref Range Status   Specimen Description BLOOD BLOOD RIGHT ARM  Final   Special Requests   Final    BOTTLES DRAWN AEROBIC AND ANAEROBIC Blood Culture results may not be optimal due to an inadequate volume of blood received in culture bottles   Culture   Final    NO GROWTH 5 DAYS Performed at [facility], [address], [place], [place] [address]    Report Status 05/27/2024 FINAL  Final     Labs: BNP (last 3 results) No results for input(s): BNP in the last 8760 hours. Basic Metabolic Panel: Recent Labs  Lab 05/25/24 0517 05/26/24 0442 05/27/24 0416 05/28/24 0439 05/28/24 1010 05/30/24 0457 05/31/24 0644  NA 137 139 137 138 136 134* 136  K 3.8 4.3 4.3 4.5 4.7 3.8 3.7  CL 101 103 103 98 99 97* 97*  CO2 25 27 24 26 24 25 26   GLUCOSE 164* 170* 192* 227* 243* 184* 186*  BUN 35* 39* 41* 41* 40* 48* 48*  CREATININE 1.28* 1.54* 1.44* 1.64* 1.51* 1.80* 1.82*  CALCIUM  9.3 9.4 9.3 10.1 9.7 9.4 9.5  MG 2.0 2.5*  --   --   --   --  2.6*  PHOS 3.8 3.8  --   --   --   --  4.0   Liver Function Tests: No results for input(s): AST, ALT, ALKPHOS, BILITOT, PROT, ALBUMIN in the last 168 hours. No results for input(s): LIPASE, AMYLASE in the last 168 hours. No results for input(s): AMMONIA in the last 168 hours. CBC: Recent Labs  Lab 05/27/24 0416 05/28/24 0439 05/28/24 1010 05/30/24 0457 05/31/24 0644  WBC 11.6* 19.6* 22.6* 14.2* 16.0*  HGB 10.7* 13.0  12.2 11.6* 12.5  HCT 32.1* 38.8 36.7 34.2* 37.9  MCV 88.7 88.6 87.6 86.6 87.5  PLT 256 362 351 311 427*   Cardiac Enzymes: No results for input(s): CKTOTAL, CKMB, CKMBINDEX, TROPONINI in the last 168 hours. BNP: Invalid input(s): POCBNP CBG: Recent Labs  Lab 05/30/24 0801 05/30/24 1138 05/30/24 1646 05/30/24 2049 05/31/24 0820  GLUCAP 202* 264* 212* 251* 230*   D-Dimer No results for input(s): DDIMER in the last 72 hours. Hgb A1c No results for input(s): HGBA1C in the last 72 hours. Lipid Profile No results for input(s): CHOL, HDL, LDLCALC, TRIG, CHOLHDL, LDLDIRECT in the last 72 hours. Thyroid  function studies No results for input(s): TSH, T4TOTAL, T3FREE, THYROIDAB in the last 72 hours.  Invalid input(s): FREET3 Anemia work up No results for input(s): VITAMINB12, FOLATE, FERRITIN,  TIBC, IRON, RETICCTPCT in the last 72 hours. Urinalysis    Component Value Date/Time   COLORURINE AMBER (A) 05/22/2024 1523   APPEARANCEUR TURBID (A) 05/22/2024 1523   LABSPEC 1.017 05/22/2024 1523   PHURINE 5.0 05/22/2024 1523   GLUCOSEU NEGATIVE 05/22/2024 1523   HGBUR NEGATIVE 05/22/2024 1523   BILIRUBINUR NEGATIVE 05/22/2024 1523   BILIRUBINUR negative 01/30/2018 1233   KETONESUR NEGATIVE 05/22/2024 1523   PROTEINUR >=300 (A) 05/22/2024 1523   UROBILINOGEN negative (A) 01/30/2018 1233   NITRITE NEGATIVE 05/22/2024 1523   LEUKOCYTESUR MODERATE (A) 05/22/2024 1523   Sepsis Labs Recent Labs  Lab 05/28/24 0439 05/28/24 1010 05/30/24 0457 05/31/24 0644  WBC 19.6* 22.6* 14.2* 16.0*   Microbiology Recent Results (from the past 240 hours)  Urine Culture     Status: Abnormal   Collection Time: 05/22/24  8:23 PM   Specimen: Urine, Clean Catch  Result Value Ref Range Status   Specimen Description   Final    URINE, CLEAN CATCH Performed at [facility], [address], [place], [place] [address]    Special Requests   Final     NONE Performed at [facility], [address] [place], [place] [address]    Culture MULTIPLE SPECIES PRESENT, SUGGEST RECOLLECTION (A)  Final   Report Status 05/24/2024 FINAL  Final  Resp panel by RT-PCR (RSV, Flu A&B, Covid) Anterior Nasal Swab     Status: None   Collection Time: 05/22/24  9:28 PM   Specimen: Anterior Nasal Swab  Result Value Ref Range Status   SARS Coronavirus 2 by RT PCR NEGATIVE NEGATIVE Final    Comment: (NOTE) SARS-CoV-2 target nucleic acids are NOT DETECTED.  The SARS-CoV-2 RNA is generally detectable in upper respiratory specimens during the acute phase of infection. The lowest concentration of SARS-CoV-2 viral copies this assay can detect is 138 copies/mL. A negative result does not preclude SARS-Cov-2 infection and should not be used as the sole basis for treatment or other patient management decisions. A negative result may occur with  improper specimen collection/handling, submission of specimen other than nasopharyngeal swab, presence of viral mutation(s) within the areas targeted by this assay, and inadequate number of viral copies(<138 copies/mL). A negative result must be combined with clinical observations, patient history, and epidemiological information. The expected result is Negative.  Fact Sheet for Patients:  BloggerCourse.com  Fact Sheet for Healthcare Providers:  SeriousBroker.it  This test is no t yet approved or cleared by the [place]  FDA and  has been authorized for detection and/or diagnosis of SARS-CoV-2 by FDA under an Emergency Use Authorization (EUA). This EUA will remain  in effect (meaning this test can be used) for the duration of the COVID-19 declaration under Section 564(b)(1) of the Act, 21 U.S.C.section 360bbb-3(b)(1), unless the authorization is terminated  or revoked sooner.       Influenza A by PCR NEGATIVE NEGATIVE Final   Influenza B by PCR  NEGATIVE NEGATIVE Final    Comment: (NOTE) The Xpert Xpress SARS-CoV-2/FLU/RSV plus assay is intended as an aid in the diagnosis of influenza from Nasopharyngeal swab specimens and should not be used as a sole basis for treatment. Nasal washings and aspirates are unacceptable for Xpert Xpress SARS-CoV-2/FLU/RSV testing.  Fact Sheet for Patients: BloggerCourse.com  Fact Sheet for Healthcare Providers: SeriousBroker.it  This test is not yet approved or cleared by the [place]  FDA and has been authorized for detection and/or diagnosis of SARS-CoV-2 by FDA under an Emergency Use Authorization (  EUA). This EUA will remain in effect (meaning this test can be used) for the duration of the COVID-19 declaration under Section 564(b)(1) of the Act, 21 U.S.C. section 360bbb-3(b)(1), unless the authorization is terminated or revoked.     Resp Syncytial Virus by PCR NEGATIVE NEGATIVE Final    Comment: (NOTE) Fact Sheet for Patients: BloggerCourse.com  Fact Sheet for Healthcare Providers: SeriousBroker.it  This test is not yet approved or cleared by the [place]  FDA and has been authorized for detection and/or diagnosis of SARS-CoV-2 by FDA under an Emergency Use Authorization (EUA). This EUA will remain in effect (meaning this test can be used) for the duration of the COVID-19 declaration under Section 564(b)(1) of the Act, 21 U.S.C. section 360bbb-3(b)(1), unless the authorization is terminated or revoked.  Performed at [facility], [address] [place], [place] [address]   Blood Culture (routine x 2)     Status: None   Collection Time: 05/22/24  9:28 PM   Specimen: Right Antecubital; Blood  Result Value Ref Range Status   Specimen Description RIGHT ANTECUBITAL  Final   Special Requests   Final    BOTTLES DRAWN AEROBIC AND ANAEROBIC Blood Culture adequate volume    Culture   Final    NO GROWTH 5 DAYS Performed at [facility], [address] [place], [place] [address]    Report Status 05/27/2024 FINAL  Final  Blood Culture (routine x 2)     Status: None   Collection Time: 05/22/24  9:33 PM   Specimen: BLOOD  Result Value Ref Range Status   Specimen Description BLOOD BLOOD RIGHT ARM  Final   Special Requests   Final    BOTTLES DRAWN AEROBIC AND ANAEROBIC Blood Culture results may not be optimal due to an inadequate volume of blood received in culture bottles   Culture   Final    NO GROWTH 5 DAYS Performed at [facility], [address], [place], [place] [address]    Report Status 05/27/2024 FINAL  Final     Time coordinating discharge: Over 30 minutes  SIGNED:   Darcel Dawley, MD  [facility] 05/31/2024, 10:05 AM Pager   If 7PM-7AM, please contact night-coverage

## 2024-05-31 NOTE — Discharge Instructions (Signed)
 Advised to follow-up with primary care physician in 1 week. Advised to take Augmentin  twice a day for 4 more days for a stercoral colitis. Advised to drink more fluids.  Advise high-fiber diet.

## 2024-05-31 NOTE — Progress Notes (Signed)
 Pt declining initial recs for STR upon d/c in order to return home with assist from spouse. Re-educated pt and provided visual handouts for HEP, mobility at home, and safety post TKR. Pt stated good understanding. + SLR on L with full extension noted. Pt has a ramp to enter home and has orders for HHPT.   05/31/24 1200  PT Visit Information  Assistance Needed +1  History of Present Illness Summer [name] is a 73 y.o. female with multiple medical problems including coronary artery disease, congestive heart failure, hyperlipidemia, hypertension, hypothyroidism, obesity, COPD, diabetes, chronic kidney disease, and sleep apnea, now status post a left total knee arthroplasty on 05/19/24 who presents to the ED 4 days post op with UTI.  Subjective Data  Subjective  I dont want rehab, Im going home tomorrow.  Patient Stated Goal go home  Precautions  Precautions Knee  Precaution Booklet Issued Yes (comment) (Pt given new HEP to follow and information on post-op TKR per protocol)  Recall of Precautions/Restrictions Intact  Required Braces or Orthoses  (+ SLR L LE)  Restrictions  Weight Bearing Restrictions Per Provider Order No  LLE Weight Bearing Per Provider Order WBAT  Pain Assessment  Pain Assessment 0-10  Pain Score 2  Pain Location L knee during mobility  Pain Descriptors / Indicators Sore;Aching  Pain Intervention(s) Monitored during session  Cognition  Arousal Alert  Behavior During Therapy WFL for tasks assessed/performed  PT - Cognitive impairments No apparent impairments  PT - Cognition Comments Pt endorses feeling better than previous date and was agreeable to session. She is A and O x4  Following Commands  Following commands Intact  Cueing  Cueing Techniques Verbal cues;Tactile cues  Communication  Communication No apparent difficulties  Bed Mobility  Overal bed mobility Needs Assistance  Bed Mobility Supine to Sit  Rolling Supervision  Transfers  Overall transfer level  Needs assistance  Equipment used Rolling walker (2 wheels)  Transfers Sit to/from Stand  Sit to Stand Supervision  Ambulation/Gait  Ambulation/Gait assistance Contact guard assist  Gait Distance (Feet) 5 Feet  Assistive device Rolling walker (2 wheels)  Gait Pattern/deviations Step-through pattern  General Gait Details  (Short distance gait in room with reliance on RW to off load L LE, no buckling, fair heel strike)  Gait velocity decreased  Stairs  (Ramp to enter home)  Balance  Overall balance assessment Needs assistance  Sitting-balance support No upper extremity supported;Feet supported  Sitting balance-Leahy Scale Good  Standing balance support Bilateral upper extremity supported;During functional activity;Reliant on assistive device for balance  Standing balance-Leahy Scale Fair  General Comments  General comments (skin integrity, edema, etc.)  (Left knee Honeycomb dressing intact w/o drainage)  Exercises  Exercises General Lower Extremity;Other exercises  Total Joint Exercises  Ankle Circles/Pumps AROM;Both;10 reps;Supine  Quad Sets AROM;Left;10 reps;Supine  Heel Slides AROM;Left;10 reps;Supine  General Exercises - Lower Extremity  Long Arc Quad AROM;Left;10 reps;Seated  Other Exercises  Other Exercises  (Standing at [facility] for L knee flexion x 5)  Other Exercises  (Pt educated on HEP upon d/c, pictured handout given, as well as education about proper positioning of L LE for optimal ROM, and benefits of frequent mobility)  PT - End of Session  Equipment Utilized During Treatment Gait belt  Activity Tolerance Patient tolerated treatment well;Other (comment)  Patient left in bed;with call bell/phone within reach;with bed alarm set  Nurse Communication Mobility status   PT - Assessment/Plan  PT Visit Diagnosis Muscle weakness (generalized) (M62.81);Other abnormalities  of gait and mobility (R26.89);Difficulty in walking, not elsewhere classified (R26.2);Pain  Pain -  Right/Left Left  Pain - part of body Knee  PT Frequency (ACUTE ONLY) 7X/week  Follow Up Recommendations Skilled nursing-short term rehab (<3 hours/day) (Pt refusing SNF)  Can patient physically be transported by private vehicle Yes  Patient can return home with the following A little help with bathing/dressing/bathroom;Assistance with cooking/housework;Assist for transportation;Help with stairs or ramp for entrance;A lot of help with walking and/or transfers  PT equipment Rolling walker (2 wheels)  AM-PAC PT 6 Clicks Mobility Outcome Measure (Version 2)  Help needed turning from your back to your side while in a flat bed without using bedrails? 3  Help needed moving from lying on your back to sitting on the side of a flat bed without using bedrails? 3  Help needed moving to and from a bed to a chair (including a wheelchair)? 3  Help needed standing up from a chair using your arms (e.g., wheelchair or bedside chair)? 3  Help needed to walk in hospital room? 3  Help needed climbing 3-5 steps with a railing?  2  6 Click Score 17  Consider Recommendation of Discharge To: Home with [facility]  Progressive Mobility  What is the highest level of mobility based on the mobility assessment? Level 4 (Ambulates with assistance) - Balance while stepping forward/back - Complete  Activity Ambulated with assistance  PT Goal Progression  Progress towards PT goals Progressing toward goals  PT Time Calculation  PT Start Time (ACUTE ONLY) 1104  PT Stop Time (ACUTE ONLY) 1127  PT Time Calculation (min) (ACUTE ONLY) 23 min  PT General Charges  $$ ACUTE PT VISIT 1 Visit  PT Treatments  $Therapeutic Exercise 8-22 mins  $Therapeutic Activity 8-22 mins  Darice Bohr, PTA 05/31/2024

## 2024-05-31 NOTE — Plan of Care (Signed)
 Pt discharged to home via wheelchair, son picked up. PIV removed x 1.     Problem: Fluid Volume: Goal: Hemodynamic stability will improve Outcome: Adequate for Discharge   Problem: Clinical Measurements: Goal: Diagnostic test results will improve Outcome: Adequate for Discharge Goal: Signs and symptoms of infection will decrease Outcome: Adequate for Discharge   Problem: Respiratory: Goal: Ability to maintain adequate ventilation will improve Outcome: Adequate for Discharge   Problem: Education: Goal: Ability to describe self-care measures that may prevent or decrease complications (Diabetes Survival Skills Education) will improve Outcome: Adequate for Discharge Goal: Individualized Educational Video(s) Outcome: Adequate for Discharge   Problem: Coping: Goal: Ability to adjust to condition or change in health will improve Outcome: Adequate for Discharge   Problem: Fluid Volume: Goal: Ability to maintain a balanced intake and output will improve Outcome: Adequate for Discharge   Problem: Health Behavior/Discharge Planning: Goal: Ability to identify and utilize available resources and services will improve Outcome: Adequate for Discharge Goal: Ability to manage health-related needs will improve Outcome: Adequate for Discharge   Problem: Metabolic: Goal: Ability to maintain appropriate glucose levels will improve Outcome: Adequate for Discharge   Problem: Nutritional: Goal: Maintenance of adequate nutrition will improve Outcome: Adequate for Discharge Goal: Progress toward achieving an optimal weight will improve Outcome: Adequate for Discharge   Problem: Skin Integrity: Goal: Risk for impaired skin integrity will decrease Outcome: Adequate for Discharge   Problem: Tissue Perfusion: Goal: Adequacy of tissue perfusion will improve Outcome: Adequate for Discharge   Problem: Education: Goal: Knowledge of General Education information will improve Description:  Including pain rating scale, medication(s)/side effects and non-pharmacologic comfort measures Outcome: Adequate for Discharge   Problem: Health Behavior/Discharge Planning: Goal: Ability to manage health-related needs will improve Outcome: Adequate for Discharge   Problem: Clinical Measurements: Goal: Ability to maintain clinical measurements within normal limits will improve Outcome: Adequate for Discharge Goal: Will remain free from infection Outcome: Adequate for Discharge Goal: Diagnostic test results will improve Outcome: Adequate for Discharge Goal: Respiratory complications will improve Outcome: Adequate for Discharge Goal: Cardiovascular complication will be avoided Outcome: Adequate for Discharge   Problem: Activity: Goal: Risk for activity intolerance will decrease Outcome: Adequate for Discharge   Problem: Nutrition: Goal: Adequate nutrition will be maintained Outcome: Adequate for Discharge   Problem: Coping: Goal: Level of anxiety will decrease Outcome: Adequate for Discharge   Problem: Elimination: Goal: Will not experience complications related to bowel motility Outcome: Adequate for Discharge Goal: Will not experience complications related to urinary retention Outcome: Adequate for Discharge   Problem: Pain Managment: Goal: General experience of comfort will improve and/or be controlled Outcome: Adequate for Discharge   Problem: Safety: Goal: Ability to remain free from injury will improve Outcome: Adequate for Discharge   Problem: Skin Integrity: Goal: Risk for impaired skin integrity will decrease Outcome: Adequate for Discharge

## 2024-06-02 ENCOUNTER — Telehealth: Payer: Self-pay

## 2024-06-02 ENCOUNTER — Encounter: Payer: Self-pay | Admitting: Family Medicine

## 2024-06-02 ENCOUNTER — Ambulatory Visit (INDEPENDENT_AMBULATORY_CARE_PROVIDER_SITE_OTHER): Admitting: Family Medicine

## 2024-06-02 VITALS — BP 128/72 | HR 99 | Resp 16 | Ht 62.0 in | Wt 221.5 lb

## 2024-06-02 DIAGNOSIS — N1831 Chronic kidney disease, stage 3a: Secondary | ICD-10-CM

## 2024-06-02 DIAGNOSIS — K5289 Other specified noninfective gastroenteritis and colitis: Secondary | ICD-10-CM

## 2024-06-02 DIAGNOSIS — N390 Urinary tract infection, site not specified: Secondary | ICD-10-CM

## 2024-06-02 DIAGNOSIS — Z96652 Presence of left artificial knee joint: Secondary | ICD-10-CM

## 2024-06-02 DIAGNOSIS — A4150 Gram-negative sepsis, unspecified: Secondary | ICD-10-CM

## 2024-06-02 DIAGNOSIS — Z09 Encounter for follow-up examination after completed treatment for conditions other than malignant neoplasm: Secondary | ICD-10-CM | POA: Diagnosis not present

## 2024-06-02 DIAGNOSIS — N179 Acute kidney failure, unspecified: Secondary | ICD-10-CM

## 2024-06-02 NOTE — Telephone Encounter (Signed)
 Copied from CRM #[id number]. Topic: Clinical - Home Health Verbal Orders >> Jun 02, 2024  3:16 PM Rosaria BRAVO wrote: [facility] called requesting to have a delay of services to see if the authorization from her insurance goes through.   Therisa from [facility] Best contact: [phone]

## 2024-06-02 NOTE — Progress Notes (Signed)
 Name: Summer [name]   MRN: [id number]    DOB: 08-08-1951   Date:06/02/2024       Progress Note  Subjective  Chief Complaint  Chief Complaint  Patient presents with   Hospitalization Follow-up    Feeling much better   Discussed the use of AI scribe software for clinical note transcription with the patient, who gave verbal consent to proceed.  History of Present Illness Summer [name] is a 73 year old female who presents for follow-up after hospitalization for complications including a UTI and colitis.  She underwent a left total knee replacement on May 19, 2024, due to primary osteoarthritis and was discharged the following day with instructions to take oxycodone  for pain, nausea medication as needed, and Lovenox  injections, which she did not start. Two days post-discharge, she was readmitted to the hospital due to confusion and other symptoms.  Upon readmission on May 22, 2024, she presented with confusion, urinary frequency, urgency, burning, and chills. Her white blood cell count was elevated, and diabetes was out of control  She was diagnosed with a urinary tract infection (UTI) and sepsis, and was treated with IV antibiotics including cefuroxime  for 3 days follwed by and Zosyn  due stercoral proctitis found on CT scan , and she was started on laxatives for constipation, which led to diarrhea that is now improving.  She was discharged on May 31, 2024, with a prescription for Augmentin  to continue treating colitis, which she has not yet started. Her kidney function was monitored during her hospital stay due to acute kidney failure on top of chronic kidney disease stage 3A, which improved before discharge.  She has not started the Lovenox  injections at home but received them during her hospital stay. Her last dose of Lovenox  post left total knee replacement surgery is tomorrow, therefore not sure if beneficial to start medication today   Her diabetes management was disrupted  during her hospital stay, and she has not yet set up her glucose meter at home. She is on Ozempic , with a dose of one milligram per week, Farxiga  10 mg  and novolog  pre meal   In the review of symptoms, no fever, chills, burning during urination, blood in stools or urine, and her diarrhea is improving. No cough, wheezing, or significant shortness of breath. She experiences some tenderness in her legs but no significant swelling.    Patient Active Problem List   Diagnosis Date Noted   Stercoral colitis 05/29/2024   Sepsis due to gram-negative UTI ([name]) 05/22/2024   MDD (major depressive disorder) 05/22/2024   Dyslipidemia 05/22/2024   GERD without esophagitis 05/22/2024   S/P TKR (total knee replacement), left 05/19/2024   Long-term use of aspirin  therapy    Secondary hyperparathyroidism of renal origin ([name]) 04/04/2024   CKD stage 3a, GFR 45-59 ml/min ([name]) 05/01/2023   Small vessel disease ([name]) 03/27/2023   Coronary artery disease involving native coronary artery of native heart without angina pectoris 03/16/2023   First degree AV block 03/16/2023   Seizure ([name]) 08/20/2022   Gouty arthritis 08/15/2022   Atherosclerosis of aorta ([name]) 07/04/2022   Insomnia due to other mental disorder 07/04/2022   Amputated toe of left foot ([name]) 07/04/2022   Chronic obstructive pulmonary disease ([name]) 01/28/2021   PAD (peripheral artery disease) ([name]) 12/03/2020   B12 deficiency 05/24/2020   Vitamin D  deficiency 05/24/2020   Metatarsalgia of left foot 01/15/2020   MDD (major depressive disorder), recurrent episode, moderate ([name]) 04/04/2019   (HFpEF) heart  failure with preserved ejection fraction ([name]) 08/31/2017   Moderate persistent asthma 08/31/2017   Dyslipidemia associated with type 2 diabetes mellitus ([name]) 06/14/2017   Uncontrolled type 2 diabetes mellitus with hyperglycemia, with long-term current use of insulin  ([name]) 08/18/2016   Charcot foot due to diabetes mellitus ([name]) 02/22/2016    Acquired abduction deformity of foot 07/12/2015   Osteoarthritis of subtalar joint 07/12/2015   Carpal tunnel syndrome 04/17/2015   Chronic constipation 04/17/2015   Stage 3b chronic kidney disease ([name]) 04/17/2015   Decreased exercise tolerance 04/17/2015   Diabetes mellitus with polyneuropathy ([name]) 04/17/2015   Gastroesophageal reflux disease without esophagitis 04/17/2015   Bursitis, trochanteric 04/17/2015   Essential hypertension 04/17/2015   Hearing loss 04/17/2015   Chronic recurrent major depressive disorder ([name]) 04/17/2015   Morbid obesity with BMI of 40.0-44.9, adult ([name]) 04/17/2015   Hypo-ovarianism 04/17/2015   Perennial allergic rhinitis with seasonal variation 04/17/2015   Acne erythematosa 04/17/2015   Dyskinesia, tardive 04/17/2015   Impingement syndrome of shoulder 04/17/2015   Obstructive sleep apnea 05/14/2014   Mixed hyperlipidemia 02/06/2012   Low back pain 09/16/2008    Past Surgical History:  Procedure Laterality Date   ABDOMINAL HYSTERECTOMY  1975   AMPUTATION TOE Left 05/02/2023   Procedure: LEFT FOOT PARTIAL AMPUTATION 1st & 2nd TOE;  Surgeon: Silva Juliene SAUNDERS, DPM;  Location: [facility] ORS;  Service: Orthopedics/Podiatry;  Laterality: Left;   ANKLE SURGERY Left approx Jan 2018   APPENDECTOMY  1970   CATARACT EXTRACTION Bilateral 01/2011   right   COLONOSCOPY WITH PROPOFOL  N/A 08/19/2019   Procedure: COLONOSCOPY WITH PROPOFOL ;  Surgeon: Therisa Bi, MD;  Location: [facility] ENDOSCOPY;  Service: Gastroenterology;  Laterality: N/A;   eye lid surgery  2013   bilateral   FOOT SURGERY     LOWER EXTREMITY ANGIOGRAPHY Left 11/23/2020   Procedure: LOWER EXTREMITY ANGIOGRAPHY;  Surgeon: Jama Cordella MATSU, MD;  Location: [facility] INVASIVE CV LAB;  Service: Cardiovascular;  Laterality: Left;   NECK SURGERY     SPINE SURGERY     TEE WITHOUT CARDIOVERSION N/A 08/16/2022   Procedure: TRANSESOPHAGEAL ECHOCARDIOGRAM (TEE);  Surgeon: Darliss Rogue, MD;  Location: [facility]  ORS;  Service: Cardiovascular;  Laterality: N/A;   TOTAL KNEE ARTHROPLASTY Left 05/19/2024   Procedure: ARTHROPLASTY, KNEE, TOTAL;  Surgeon: Lorelle Hussar, MD;  Location: [facility] ORS;  Service: Orthopedics;  Laterality: Left;   TUBAL LIGATION     VAGINAL HYSTERECTOMY  1989    Family History  Problem Relation Age of Onset   Aneurysm Mother    Aortic aneurysm Mother    Hypertension Mother    Hyperlipidemia Mother    Heart disease Mother    Obesity Mother    Heart attack Maternal Grandfather    Diabetes Maternal Grandfather     Social History   Tobacco Use   Smoking status: Never   Smokeless tobacco: Never  Substance Use Topics   Alcohol use: No    Alcohol/week: 0.0 standard drinks of alcohol     Current Outpatient Medications:    acetaminophen  (TYLENOL ) 500 MG tablet, Take 2 tablets (1,000 mg total) by mouth every 8 (eight) hours., Disp: 30 tablet, Rfl: 0   ALBUTEROL  IN, Inhale 2 puffs into the lungs as needed., Disp: , Rfl:    amLODipine  (NORVASC ) 5 MG tablet, Take 1 tablet (5 mg total) by mouth daily., Disp: 30 tablet, Rfl: 5   amoxicillin -clavulanate (AUGMENTIN ) 875-125 MG tablet, Take 1 tablet by mouth 2 (two) times daily for 4 days., Disp:  8 tablet, Rfl: 0   ARIPiprazole  (ABILIFY ) 2 MG tablet, Take 1 tablet (2 mg total) by mouth every evening., Disp: 30 tablet, Rfl: 5   ASPIRIN  LOW DOSE 81 MG EC tablet, TAKE 1 TABLET (81 MG TOTAL) BY MOUTH DAILY., Disp: 30 tablet, Rfl: 0   B-D ULTRAFINE III SHORT PEN 31G X 8 MM MISC, , Disp: , Rfl:    bisacodyl  (DULCOLAX) 5 MG EC tablet, Take 2 tablets (10 mg total) by mouth at bedtime., Disp: 30 tablet, Rfl: 0   Boswellia-Glucosamine-Vit D (OSTEO BI-FLEX ONE PER DAY PO), Take 1 capsule by mouth every morning., Disp: , Rfl:    buPROPion  (WELLBUTRIN  XL) 150 MG 24 hr tablet, Take 1 tablet (150 mg total) by mouth daily., Disp: 30 tablet, Rfl: 5   chlorhexidine  (HIBICLENS ) 4 % external liquid, Apply 15 mLs (1 Application total) topically as  directed for 30 doses. Use as directed daily for 5 days every other week for 6 weeks., Disp: 946 mL, Rfl: 1   Cholecalciferol  (VITAMIN D3) 25 MCG (1000 UT) CAPS, Take 1,000 Units by mouth daily. (Patient taking differently: Take 2,000 Units by mouth 2 (two) times daily.), Disp: , Rfl:    Continuous Blood Gluc Sensor (FREESTYLE LIBRE 2 SENSOR) MISC, FOR GLUCOSE MONITORING, Disp: , Rfl:    docusate sodium  (COLACE) 100 MG capsule, Take 1 capsule (100 mg total) by mouth 2 (two) times daily., Disp: 10 capsule, Rfl: 0   enoxaparin  (LOVENOX ) 40 MG/0.4ML injection, Inject 0.4 mLs (40 mg total) into the skin daily for 14 days., Disp: 5.6 mL, Rfl: 0   ezetimibe  (ZETIA ) 10 MG tablet, Take 1 tablet (10 mg total) by mouth daily., Disp: 30 tablet, Rfl: 5   FARXIGA  10 MG TABS tablet, Take 1 tablet (10 mg total) by mouth every morning., Disp: 30 tablet, Rfl: 5   furosemide  (LASIX ) 20 MG tablet, Take 1 tablet (20 mg total) by mouth daily., Disp: 30 tablet, Rfl: 5   insulin  aspart (NOVOLOG  FLEXPEN) 100 UNIT/ML FlexPen, Inject 14 Units into the skin in the morning and at bedtime. (Patient taking differently: Inject 14 Units into the skin daily.), Disp: , Rfl:    Lactobacillus-Inulin (CULTURELLE DIGESTIVE DAILY PO), Take 15 Billion Cells by mouth daily., Disp: , Rfl:    metoprolol  tartrate (LOPRESSOR ) 25 MG tablet, Take 0.5 tablets (12.5 mg total) by mouth 2 (two) times daily., Disp: 60 tablet, Rfl: 0   Misc Natural Products (OSTEO BI-FLEX ADV JOINT SHIELD PO), Take by mouth 2 (two) times daily. (Patient taking differently: Take by mouth daily.), Disp: , Rfl:    mupirocin  ointment (BACTROBAN ) 2 %, Place 1 Application into the nose 2 (two) times daily for 60 doses. Use as directed 2 times daily for 5 days every other week for 6 weeks., Disp: 60 g, Rfl: 0   omeprazole  (PRILOSEC) 40 MG capsule, Take 1 capsule (40 mg total) by mouth daily., Disp: 30 capsule, Rfl: 5   ondansetron  (ZOFRAN ) 4 MG tablet, Take 1 tablet (4 mg  total) by mouth every 6 (six) hours as needed for nausea., Disp: 20 tablet, Rfl: 0   oxyCODONE  (OXY IR/ROXICODONE ) 5 MG immediate release tablet, Take 0.5-1 tablets (2.5-5 mg total) by mouth every 4 (four) hours as needed for moderate pain (pain score 4-6)., Disp: 30 tablet, Rfl: 0   polyethylene glycol (MIRALAX  / GLYCOLAX ) 17 g packet, Take 17 g by mouth 2 (two) times daily., Disp: 60 packet, Rfl: 0   pregabalin  (LYRICA ) 150 MG capsule,  Take 1 capsule (150 mg total) by mouth 3 (three) times daily. (Patient taking differently: Take 150 mg by mouth 2 (two) times daily.), Disp: 90 capsule, Rfl: 5   psyllium (METAMUCIL) 58.6 % powder, Take 1 packet by mouth daily., Disp: , Rfl:    rosuvastatin  (CRESTOR ) 40 MG tablet, Take 1 tablet (40 mg total) by mouth daily., Disp: 30 tablet, Rfl: 5   Semaglutide  (OZEMPIC , 1 MG/DOSE, ), Inject into the skin once a week. On thursday, Disp: , Rfl:    sertraline  (ZOLOFT ) 100 MG tablet, Take 1.5 tablets (150 mg total) by mouth daily., Disp: 45 tablet, Rfl: 5   traZODone  (DESYREL ) 100 MG tablet, Take 1 tablet (100 mg total) by mouth at bedtime as needed. for sleep (Patient taking differently: Take 150 mg by mouth at bedtime. for sleep), Disp: 30 tablet, Rfl: 5   TRESIBA FLEXTOUCH 200 UNIT/ML FlexTouch Pen, 85 Units daily. In morning, Disp: , Rfl:    VASCEPA  1 g capsule, Take 2 capsules (2 g total) by mouth 2 (two) times daily., Disp: 120 capsule, Rfl: 5  Allergies  Allergen Reactions   Codeine Other (See Comments)    TRIPPED OUT  DIDN'T LIKE THE WAY IT FELT   Atorvastatin     muscle pain   Hydrocodone      itching   Tramadol     Latex Rash   Zolpidem  Other (See Comments)    Sleep walk    I personally reviewed active problem list, medication list, allergies, family history with the patient/caregiver today.   ROS  Ten systems reviewed and is negative except as mentioned in HPI    Objective Physical Exam CONSTITUTIONAL: Patient appears well-developed and  well-nourished. No distress. HEENT: Head atraumatic, normocephalic, neck supple. CARDIOVASCULAR: Normal rate, regular rhythm and normal heart sounds. No murmur heard. No BLE edema. Calf tenderness present. MUSCULAR SKELETAL: recent left knee replacement surgery, very mild left calf tenderness but no swelling, seeing Ortho tomorrow.  PULMONARY: Effort normal and breath sounds normal. No respiratory distress. ABDOMINAL: There is no tenderness or distention. Abdomen non-tender. PSYCHIATRIC: Patient has a normal mood and affect. Behavior is normal. Judgment and thought content normal.  Vitals:   06/02/24 0957  BP: 128/72  Pulse: 99  Resp: 16  SpO2: 100%  Weight: 221 lb 8 oz (100.5 kg)  Height: 5' 2 (1.575 m)    Body mass index is 40.51 kg/m.  Recent Results (from the past 2160 hours)  Microalbumin, urine     Status: None   Collection Time: 03/27/24 12:00 AM  Result Value Ref Range   Microalb, Ur 3.4   CBC and differential     Status: None   Collection Time: 03/27/24 12:00 AM  Result Value Ref Range   Hemoglobin 12.8 12.0 - 16.0   HCT 39 36 - 46   Neutrophils Absolute 5,392.00    Platelets 191 150 - 400 K/uL   WBC 8.6   CBC     Status: None   Collection Time: 03/27/24 12:00 AM  Result Value Ref Range   RBC 4.34 3.87 - 5.11  HM DIABETES FOOT EXAM     Status: None   Collection Time: 03/27/24 12:00 AM  Result Value Ref Range   HM Diabetic Foot Exam Normal   Microalbumin / creatinine urine ratio     Status: None   Collection Time: 03/27/24 12:00 AM  Result Value Ref Range   Microalb Creat Ratio 57   Protein / creatinine ratio, urine  Status: None   Collection Time: 03/27/24 12:00 AM  Result Value Ref Range   Creatinine, Urine 60   Basic metabolic panel with GFR     Status: Abnormal   Collection Time: 03/27/24 12:00 AM  Result Value Ref Range   Glucose 128    BUN 34 (A) 4 - 21   CO2 28 (A) 13 - 22   Creatinine 1.4 (A) 0.5 - 1.1   Potassium 4.9 3.5 - 5.1 mEq/L    Sodium 140 137 - 147   Chloride 105 99 - 108  Comprehensive metabolic panel with GFR     Status: None   Collection Time: 03/27/24 12:00 AM  Result Value Ref Range   eGFR 39    Calcium  10.3 8.7 - 10.7  Hemoglobin A1c     Status: None   Collection Time: 03/27/24 12:00 AM  Result Value Ref Range   Hemoglobin A1C 7.7   Surgical pcr screen     Status: Abnormal   Collection Time: 05/06/24 11:49 AM   Specimen: Nasal Mucosa; Nasal Swab  Result Value Ref Range   MRSA, PCR NEGATIVE NEGATIVE   Staphylococcus aureus POSITIVE (A) NEGATIVE    Comment: (NOTE) The Xpert SA Assay (FDA approved for NASAL specimens in patients 31 years of age and older), is one component of a comprehensive surveillance program. It is not intended to diagnose infection nor to guide or monitor treatment. Performed at [facility], [address] [place], [place] [address]   CBC WITH DIFFERENTIAL     Status: Abnormal   Collection Time: 05/06/24 11:49 AM  Result Value Ref Range   WBC 9.9 4.0 - 10.5 K/uL   RBC 4.41 3.87 - 5.11 MIL/uL   Hemoglobin 13.2 12.0 - 15.0 g/dL   HCT 60.3 63.9 - 53.9 %   MCV 89.8 80.0 - 100.0 fL   MCH 29.9 26.0 - 34.0 pg   MCHC 33.3 30.0 - 36.0 g/dL   RDW 85.3 88.4 - 84.4 %   Platelets 204 150 - 400 K/uL   nRBC 0.0 0.0 - 0.2 %   Neutrophils Relative % 63 %   Neutro Abs 6.2 1.7 - 7.7 K/uL   Lymphocytes Relative 22 %   Lymphs Abs 2.2 0.7 - 4.0 K/uL   Monocytes Relative 10 %   Monocytes Absolute 1.0 0.1 - 1.0 K/uL   Eosinophils Relative 3 %   Eosinophils Absolute 0.3 0.0 - 0.5 K/uL   Basophils Relative 1 %   Basophils Absolute 0.1 0.0 - 0.1 K/uL   Immature Granulocytes 1 %   Abs Immature Granulocytes 0.08 (H) 0.00 - 0.07 K/uL    Comment: Performed at [facility], [address] [place], [place] [address]  Comprehensive metabolic panel     Status: Abnormal   Collection Time: 05/06/24 11:49 AM  Result Value Ref Range   Sodium 141 135 - 145 mmol/L   Potassium  4.4 3.5 - 5.1 mmol/L   Chloride 106 98 - 111 mmol/L   CO2 26 22 - 32 mmol/L   Glucose, Bld 84 70 - 99 mg/dL    Comment: Glucose reference range applies only to samples taken after fasting for at least 8 hours.   BUN 40 (H) 8 - 23 mg/dL   Creatinine, Ser 8.54 (H) 0.44 - 1.00 mg/dL   Calcium  10.0 8.9 - 10.3 mg/dL   Total Protein 7.2 6.5 - 8.1 g/dL   Albumin 4.5 3.5 - 5.0 g/dL   AST 22 15 -  41 U/L   ALT 21 0 - 44 U/L   Alkaline Phosphatase 58 38 - 126 U/L   Total Bilirubin 0.7 0.0 - 1.2 mg/dL   GFR, Estimated 38 (L) >60 mL/min    Comment: (NOTE) Calculated using the CKD-EPI Creatinine Equation (2021)    Anion gap 9 5 - 15    Comment: Performed at [facility], [address] [place], [place] [address]  Urinalysis, Routine w reflex microscopic -Urine, Clean Catch     Status: Abnormal   Collection Time: 05/06/24 11:49 AM  Result Value Ref Range   Color, Urine STRAW (A) YELLOW   APPearance CLEAR (A) CLEAR   Specific Gravity, Urine 1.014 1.005 - 1.030   pH 5.0 5.0 - 8.0   Glucose, UA >=500 (A) NEGATIVE mg/dL   Hgb urine dipstick NEGATIVE NEGATIVE   Bilirubin Urine NEGATIVE NEGATIVE   Ketones, ur NEGATIVE NEGATIVE mg/dL   Protein, ur NEGATIVE NEGATIVE mg/dL   Nitrite NEGATIVE NEGATIVE   Leukocytes,Ua SMALL (A) NEGATIVE   RBC / HPF 0-5 0 - 5 RBC/hpf   WBC, UA 6-10 0 - 5 WBC/hpf   Bacteria, UA NONE SEEN NONE SEEN   Squamous Epithelial / HPF 0-5 0 - 5 /HPF    Comment: Performed at [facility], [address] [place], [place] [address]  Glucose, capillary     Status: Abnormal   Collection Time: 05/19/24  6:23 AM  Result Value Ref Range   Glucose-Capillary 160 (H) 70 - 99 mg/dL    Comment: Glucose reference range applies only to samples taken after fasting for at least 8 hours.  Glucose, capillary     Status: Abnormal   Collection Time: 05/19/24 10:14 AM  Result Value Ref Range   Glucose-Capillary 175 (H) 70 - 99 mg/dL    Comment: Glucose reference range  applies only to samples taken after fasting for at least 8 hours.  Glucose, capillary     Status: Abnormal   Collection Time: 05/19/24  4:33 PM  Result Value Ref Range   Glucose-Capillary 215 (H) 70 - 99 mg/dL    Comment: Glucose reference range applies only to samples taken after fasting for at least 8 hours.  Glucose, capillary     Status: Abnormal   Collection Time: 05/19/24  9:25 PM  Result Value Ref Range   Glucose-Capillary 239 (H) 70 - 99 mg/dL    Comment: Glucose reference range applies only to samples taken after fasting for at least 8 hours.  CBC     Status: Abnormal   Collection Time: 05/20/24  6:19 AM  Result Value Ref Range   WBC 12.3 (H) 4.0 - 10.5 K/uL   RBC 3.70 (L) 3.87 - 5.11 MIL/uL   Hemoglobin 11.1 (L) 12.0 - 15.0 g/dL   HCT 66.3 (L) 63.9 - 53.9 %   MCV 90.8 80.0 - 100.0 fL   MCH 30.0 26.0 - 34.0 pg   MCHC 33.0 30.0 - 36.0 g/dL   RDW 85.9 88.4 - 84.4 %   Platelets 173 150 - 400 K/uL   nRBC 0.0 0.0 - 0.2 %    Comment: Performed at [facility], [address] [place], [place] [address]  Basic metabolic panel     Status: Abnormal   Collection Time: 05/20/24  6:19 AM  Result Value Ref Range   Sodium 140 135 - 145 mmol/L   Potassium 4.6 3.5 - 5.1 mmol/L   Chloride 105 98 - 111 mmol/L   CO2  24 22 - 32 mmol/L   Glucose, Bld 202 (H) 70 - 99 mg/dL    Comment: Glucose reference range applies only to samples taken after fasting for at least 8 hours.   BUN 37 (H) 8 - 23 mg/dL   Creatinine, Ser 8.57 (H) 0.44 - 1.00 mg/dL   Calcium  8.9 8.9 - 10.3 mg/dL   GFR, Estimated 39 (L) >60 mL/min    Comment: (NOTE) Calculated using the CKD-EPI Creatinine Equation (2021)    Anion gap 11 5 - 15    Comment: Performed at [facility], [address] [place], [place] [address]  Glucose, capillary     Status: Abnormal   Collection Time: 05/20/24  8:07 AM  Result Value Ref Range   Glucose-Capillary 161 (H) 70 - 99 mg/dL    Comment: Glucose reference range  applies only to samples taken after fasting for at least 8 hours.  Glucose, capillary     Status: Abnormal   Collection Time: 05/20/24 12:22 PM  Result Value Ref Range   Glucose-Capillary 168 (H) 70 - 99 mg/dL    Comment: Glucose reference range applies only to samples taken after fasting for at least 8 hours.  Urinalysis, Routine w reflex microscopic -Urine, Clean Catch     Status: Abnormal   Collection Time: 05/22/24  3:23 PM  Result Value Ref Range   Color, Urine AMBER (A) YELLOW    Comment: BIOCHEMICALS MAY BE AFFECTED BY COLOR   APPearance TURBID (A) CLEAR   Specific Gravity, Urine 1.017 1.005 - 1.030   pH 5.0 5.0 - 8.0   Glucose, UA NEGATIVE NEGATIVE mg/dL   Hgb urine dipstick NEGATIVE NEGATIVE   Bilirubin Urine NEGATIVE NEGATIVE   Ketones, ur NEGATIVE NEGATIVE mg/dL   Protein, ur >=699 (A) NEGATIVE mg/dL   Nitrite NEGATIVE NEGATIVE   Leukocytes,Ua MODERATE (A) NEGATIVE   RBC / HPF 6-10 0 - 5 RBC/hpf   WBC, UA >50 0 - 5 WBC/hpf   Bacteria, UA MANY (A) NONE SEEN   Squamous Epithelial / HPF 0-5 0 - 5 /HPF   Mucus PRESENT     Comment: Performed at [facility], [address] [place], [place] [address]  Lipase, blood     Status: None   Collection Time: 05/22/24  3:49 PM  Result Value Ref Range   Lipase 22 11 - 51 U/L    Comment: Performed at [facility], [address] [place], [place] [address]  Comprehensive metabolic panel     Status: Abnormal   Collection Time: 05/22/24  3:49 PM  Result Value Ref Range   Sodium 137 135 - 145 mmol/L   Potassium 4.2 3.5 - 5.1 mmol/L   Chloride 100 98 - 111 mmol/L   CO2 18 (L) 22 - 32 mmol/L   Glucose, Bld 271 (H) 70 - 99 mg/dL    Comment: Glucose reference range applies only to samples taken after fasting for at least 8 hours.   BUN 24 (H) 8 - 23 mg/dL   Creatinine, Ser 8.49 (H) 0.44 - 1.00 mg/dL   Calcium  9.9 8.9 - 10.3 mg/dL   Total Protein 7.6 6.5 - 8.1 g/dL   Albumin 3.8 3.5 - 5.0 g/dL   AST 25 15 - 41 U/L    ALT 12 0 - 44 U/L   Alkaline Phosphatase 63 38 - 126 U/L   Total Bilirubin 1.5 (H) 0.0 - 1.2 mg/dL   GFR, Estimated 36 (L) >60 mL/min  Comment: (NOTE) Calculated using the CKD-EPI Creatinine Equation (2021)    Anion gap 19 (H) 5 - 15    Comment: Performed at [facility], [address] [place], [place] [address]  CBC     Status: Abnormal   Collection Time: 05/22/24  3:49 PM  Result Value Ref Range   WBC 15.7 (H) 4.0 - 10.5 K/uL   RBC 4.22 3.87 - 5.11 MIL/uL   Hemoglobin 12.4 12.0 - 15.0 g/dL   HCT 62.1 63.9 - 53.9 %   MCV 89.6 80.0 - 100.0 fL   MCH 29.4 26.0 - 34.0 pg   MCHC 32.8 30.0 - 36.0 g/dL   RDW 85.9 88.4 - 84.4 %   Platelets 251 150 - 400 K/uL   nRBC 0.0 0.0 - 0.2 %    Comment: Performed at [facility], [address], [place], [place] [address]  Lactic acid, plasma     Status: None   Collection Time: 05/22/24  3:49 PM  Result Value Ref Range   Lactic Acid, Venous 1.6 0.5 - 1.9 mmol/L    Comment: Performed at [facility], [address], [place], [place] [address]  Urine Culture     Status: Abnormal   Collection Time: 05/22/24  8:23 PM   Specimen: Urine, Clean Catch  Result Value Ref Range   Specimen Description      URINE, CLEAN CATCH Performed at [facility], [address] [place], [place] [address]    Special Requests      NONE Performed at [facility], [address], [place], [place] [address]    Culture MULTIPLE SPECIES PRESENT, SUGGEST RECOLLECTION (A)    Report Status 05/24/2024 FINAL   Blood gas, venous     Status: Abnormal   Collection Time: 05/22/24  8:23 PM  Result Value Ref Range   pH, Ven 7.39 7.25 - 7.43   pCO2, Ven 38 (L) 44 - 60 mmHg   pO2, Ven 40 32 - 45 mmHg   Bicarbonate 23.0 20.0 - 28.0 mmol/L   Acid-base deficit 1.7 0.0 - 2.0 mmol/L   O2 Saturation 71.4 %   Patient temperature 37.0    Collection site VEIN     Comment: Performed at [facility], [address] [place], [place] [address]  CBG monitoring, ED     Status: Abnormal   Collection Time: 05/22/24  9:27 PM  Result Value Ref Range   Glucose-Capillary 267 (H) 70 - 99 mg/dL    Comment: Glucose reference range applies only to samples taken after fasting for at least 8 hours.  Resp panel by RT-PCR (RSV, Flu A&B, Covid) Anterior Nasal Swab     Status: None   Collection Time: 05/22/24  9:28 PM   Specimen: Anterior Nasal Swab  Result Value Ref Range   SARS Coronavirus 2 by RT PCR NEGATIVE NEGATIVE    Comment: (NOTE) SARS-CoV-2 target nucleic acids are NOT DETECTED.  The SARS-CoV-2 RNA is generally detectable in upper respiratory specimens during the acute phase of infection. The lowest concentration of SARS-CoV-2 viral copies this assay can detect is 138 copies/mL. A negative result does not preclude SARS-Cov-2 infection and should not be used as the sole basis for treatment or other patient management decisions. A negative result may occur with  improper specimen collection/handling, submission of specimen other than nasopharyngeal swab, presence of viral mutation(s) within the areas targeted by this assay, and inadequate number of viral copies(<138 copies/mL). A negative result  must be combined with clinical observations, patient history, and epidemiological information. The expected result is Negative.  Fact Sheet for Patients:  BloggerCourse.com  Fact Sheet for Healthcare Providers:  SeriousBroker.it  This test is no t yet approved or cleared by the [place]  FDA and  has been authorized for detection and/or diagnosis of SARS-CoV-2 by FDA under an Emergency Use Authorization (EUA). This EUA will remain  in effect (meaning this test can be used) for the duration of the COVID-19 declaration under Section 564(b)(1) of the Act, 21 U.S.C.section 360bbb-3(b)(1), unless the authorization is terminated  or revoked sooner.        Influenza A by PCR NEGATIVE NEGATIVE   Influenza B by PCR NEGATIVE NEGATIVE    Comment: (NOTE) The Xpert Xpress SARS-CoV-2/FLU/RSV plus assay is intended as an aid in the diagnosis of influenza from Nasopharyngeal swab specimens and should not be used as a sole basis for treatment. Nasal washings and aspirates are unacceptable for Xpert Xpress SARS-CoV-2/FLU/RSV testing.  Fact Sheet for Patients: BloggerCourse.com  Fact Sheet for Healthcare Providers: SeriousBroker.it  This test is not yet approved or cleared by the [place]  FDA and has been authorized for detection and/or diagnosis of SARS-CoV-2 by FDA under an Emergency Use Authorization (EUA). This EUA will remain in effect (meaning this test can be used) for the duration of the COVID-19 declaration under Section 564(b)(1) of the Act, 21 U.S.C. section 360bbb-3(b)(1), unless the authorization is terminated or revoked.     Resp Syncytial Virus by PCR NEGATIVE NEGATIVE    Comment: (NOTE) Fact Sheet for Patients: BloggerCourse.com  Fact Sheet for Healthcare Providers: SeriousBroker.it  This test is not yet approved or cleared by the [place]  FDA and has been authorized for detection and/or diagnosis of SARS-CoV-2 by FDA under an Emergency Use Authorization (EUA). This EUA will remain in effect (meaning this test can be used) for the duration of the COVID-19 declaration under Section 564(b)(1) of the Act, 21 U.S.C. section 360bbb-3(b)(1), unless the authorization is terminated or revoked.  Performed at [facility], [address], [place], [place] [address]   Blood Culture (routine x 2)     Status: None   Collection Time: 05/22/24  9:28 PM   Specimen: Right Antecubital; Blood  Result Value Ref Range   Specimen Description RIGHT ANTECUBITAL    Special Requests      BOTTLES DRAWN AEROBIC AND ANAEROBIC  Blood Culture adequate volume   Culture      NO GROWTH 5 DAYS Performed at [facility], [address], [place], [place] [address]    Report Status 05/27/2024 FINAL   Blood Culture (routine x 2)     Status: None   Collection Time: 05/22/24  9:33 PM   Specimen: BLOOD  Result Value Ref Range   Specimen Description BLOOD BLOOD RIGHT ARM    Special Requests      BOTTLES DRAWN AEROBIC AND ANAEROBIC Blood Culture results may not be optimal due to an inadequate volume of blood received in culture bottles   Culture      NO GROWTH 5 DAYS Performed at [facility], [address] [place], [place] [address]    Report Status 05/27/2024 FINAL   CBG monitoring, ED     Status: Abnormal   Collection Time: 05/22/24 11:28 PM  Result Value Ref Range   Glucose-Capillary 201 (H) 70 - 99 mg/dL    Comment: Glucose reference range applies only to samples taken after fasting for at  least 8 hours.  Protime-INR     Status: Abnormal   Collection Time: 05/23/24  7:51 AM  Result Value Ref Range   Prothrombin Time 15.9 (H) 11.4 - 15.2 seconds   INR 1.2 0.8 - 1.2    Comment: (NOTE) INR goal varies based on device and disease states. Performed at [facility], [address] [place], [place] [address]   Cortisol-am, blood     Status: Abnormal   Collection Time: 05/23/24  7:51 AM  Result Value Ref Range   Cortisol - AM 22.8 (H) 6.7 - 22.6 ug/dL    Comment: Performed at [facility], [address] [place], [place] [address]  Basic metabolic panel     Status: Abnormal   Collection Time: 05/23/24  7:51 AM  Result Value Ref Range   Sodium 142 135 - 145 mmol/L   Potassium 3.7 3.5 - 5.1 mmol/L   Chloride 109 98 - 111 mmol/L   CO2 26 22 - 32 mmol/L   Glucose, Bld 100 (H) 70 - 99 mg/dL    Comment: Glucose reference range applies only to samples taken after fasting for at least 8 hours.   BUN 24 (H) 8 - 23 mg/dL   Creatinine, Ser 8.74 (H) 0.44 - 1.00 mg/dL   Calcium  9.2  8.9 - 10.3 mg/dL   GFR, Estimated 45 (L) >60 mL/min    Comment: (NOTE) Calculated using the CKD-EPI Creatinine Equation (2021)    Anion gap 7 5 - 15    Comment: Performed at [facility], [address] [place], [place] [address]  CBC     Status: Abnormal   Collection Time: 05/23/24  7:51 AM  Result Value Ref Range   WBC 9.8 4.0 - 10.5 K/uL   RBC 3.55 (L) 3.87 - 5.11 MIL/uL   Hemoglobin 10.4 (L) 12.0 - 15.0 g/dL   HCT 67.8 (L) 63.9 - 53.9 %   MCV 90.4 80.0 - 100.0 fL   MCH 29.3 26.0 - 34.0 pg   MCHC 32.4 30.0 - 36.0 g/dL   RDW 85.7 88.4 - 84.4 %   Platelets 192 150 - 400 K/uL   nRBC 0.0 0.0 - 0.2 %    Comment: Performed at [facility], [address] [place], [place] [address]  Folate     Status: None   Collection Time: 05/23/24  7:51 AM  Result Value Ref Range   Folate 12.5 >5.9 ng/mL    Comment: Performed at [facility], [address] [place], [place] [address]  Magnesium      Status: None   Collection Time: 05/23/24  7:51 AM  Result Value Ref Range   Magnesium  2.0 1.7 - 2.4 mg/dL    Comment: Performed at [facility], [address], [place], [place] [address]  Phosphorus     Status: Abnormal   Collection Time: 05/23/24  7:51 AM  Result Value Ref Range   Phosphorus 2.0 (L) 2.5 - 4.6 mg/dL    Comment: Performed at [facility], [address] [place], [place] [address]  Glucose, capillary     Status: Abnormal   Collection Time: 05/23/24 11:40 AM  Result Value Ref Range   Glucose-Capillary 189 (H) 70 - 99 mg/dL    Comment: Glucose reference range applies only to samples taken after fasting for at least 8 hours. PATIENT IDENTIFICATION ERROR. PLEASE DISREGARD RESULTS. ACCOUNT WILL BE CREDITED. Performed at [facility], [address] [place], [place]  [address]   Glucose, capillary     Status: None   Collection Time: 05/23/24 12:09 PM  Result Value Ref Range   Glucose-Capillary 96 70 - 99 mg/dL    Comment:  Glucose reference range applies only to samples taken after fasting for at least 8 hours.  Glucose, capillary     Status: Abnormal   Collection Time: 05/23/24  5:08 PM  Result Value Ref Range   Glucose-Capillary 203 (H) 70 - 99 mg/dL    Comment: Glucose reference range applies only to samples taken after fasting for at least 8 hours.   Comment 1 Notify RN    Comment 2 Document in Chart   Glucose, capillary     Status: Abnormal   Collection Time: 05/23/24  9:07 PM  Result Value Ref Range   Glucose-Capillary 203 (H) 70 - 99 mg/dL    Comment: Glucose reference range applies only to samples taken after fasting for at least 8 hours.  Basic metabolic panel with GFR     Status: Abnormal   Collection Time: 05/24/24  6:21 AM  Result Value Ref Range   Sodium 140 135 - 145 mmol/L   Potassium 3.5 3.5 - 5.1 mmol/L   Chloride 105 98 - 111 mmol/L   CO2 23 22 - 32 mmol/L   Glucose, Bld 117 (H) 70 - 99 mg/dL    Comment: Glucose reference range applies only to samples taken after fasting for at least 8 hours.   BUN 29 (H) 8 - 23 mg/dL   Creatinine, Ser 8.69 (H) 0.44 - 1.00 mg/dL   Calcium  9.0 8.9 - 10.3 mg/dL   GFR, Estimated 43 (L) >60 mL/min    Comment: (NOTE) Calculated using the CKD-EPI Creatinine Equation (2021)    Anion gap 12 5 - 15    Comment: Performed at [facility], [address] [place], [place] [address]  CBC     Status: Abnormal   Collection Time: 05/24/24  6:21 AM  Result Value Ref Range   WBC 8.4 4.0 - 10.5 K/uL   RBC 3.37 (L) 3.87 - 5.11 MIL/uL   Hemoglobin 10.1 (L) 12.0 - 15.0 g/dL   HCT 70.1 (L) 63.9 - 53.9 %   MCV 88.4 80.0 - 100.0 fL   MCH 30.0 26.0 - 34.0 pg   MCHC 33.9 30.0 - 36.0 g/dL   RDW 85.6 88.4 - 84.4 %   Platelets 189 150 - 400 K/uL   nRBC 0.0 0.0 - 0.2 %    Comment: Performed at [facility], [address] [place], [place] [address]  Magnesium      Status: None   Collection Time: 05/24/24  6:21 AM  Result Value Ref Range    Magnesium  1.9 1.7 - 2.4 mg/dL    Comment: Performed at [facility], [address] [place], [place] [address]  Phosphorus     Status: None   Collection Time: 05/24/24  6:21 AM  Result Value Ref Range   Phosphorus 3.8 2.5 - 4.6 mg/dL    Comment: Performed at [facility], [address] [place], [place] [address]  Glucose, capillary     Status: Abnormal   Collection Time: 05/24/24  7:48 AM  Result Value Ref Range   Glucose-Capillary 115 (H) 70 - 99 mg/dL    Comment: Glucose reference range applies only to samples taken after fasting for at least 8 hours.   Comment 1 Notify RN    Comment 2 Document in Chart  Glucose, capillary     Status: Abnormal   Collection Time: 05/24/24 12:24 PM  Result Value Ref Range   Glucose-Capillary 229 (H) 70 - 99 mg/dL    Comment: Glucose reference range applies only to samples taken after fasting for at least 8 hours.   Comment 1 Notify RN    Comment 2 Document in Chart   Glucose, capillary     Status: Abnormal   Collection Time: 05/24/24  4:48 PM  Result Value Ref Range   Glucose-Capillary 216 (H) 70 - 99 mg/dL    Comment: Glucose reference range applies only to samples taken after fasting for at least 8 hours.   Comment 1 Notify RN    Comment 2 Document in Chart   Glucose, capillary     Status: Abnormal   Collection Time: 05/24/24  9:00 PM  Result Value Ref Range   Glucose-Capillary 206 (H) 70 - 99 mg/dL    Comment: Glucose reference range applies only to samples taken after fasting for at least 8 hours.  Basic metabolic panel with GFR     Status: Abnormal   Collection Time: 05/25/24  5:17 AM  Result Value Ref Range   Sodium 137 135 - 145 mmol/L   Potassium 3.8 3.5 - 5.1 mmol/L   Chloride 101 98 - 111 mmol/L   CO2 25 22 - 32 mmol/L   Glucose, Bld 164 (H) 70 - 99 mg/dL    Comment: Glucose reference range applies only to samples taken after fasting for at least 8 hours.   BUN 35 (H) 8 - 23 mg/dL   Creatinine, Ser 8.71 (H)  0.44 - 1.00 mg/dL   Calcium  9.3 8.9 - 10.3 mg/dL   GFR, Estimated 44 (L) >60 mL/min    Comment: (NOTE) Calculated using the CKD-EPI Creatinine Equation (2021)    Anion gap 11 5 - 15    Comment: Performed at [facility], [address] [place], [place] [address]  CBC     Status: Abnormal   Collection Time: 05/25/24  5:17 AM  Result Value Ref Range   WBC 8.0 4.0 - 10.5 K/uL   RBC 3.71 (L) 3.87 - 5.11 MIL/uL   Hemoglobin 10.6 (L) 12.0 - 15.0 g/dL   HCT 67.2 (L) 63.9 - 53.9 %   MCV 88.1 80.0 - 100.0 fL   MCH 28.6 26.0 - 34.0 pg   MCHC 32.4 30.0 - 36.0 g/dL   RDW 85.9 88.4 - 84.4 %   Platelets 210 150 - 400 K/uL   nRBC 0.0 0.0 - 0.2 %    Comment: Performed at [facility], [address] [place], [place] [address]  Magnesium      Status: None   Collection Time: 05/25/24  5:17 AM  Result Value Ref Range   Magnesium  2.0 1.7 - 2.4 mg/dL    Comment: Performed at [facility], [address], [place], [place] [address]  Phosphorus     Status: None   Collection Time: 05/25/24  5:17 AM  Result Value Ref Range   Phosphorus 3.8 2.5 - 4.6 mg/dL    Comment: Performed at [facility], [address] [place], [place] [address]  Glucose, capillary     Status: Abnormal   Collection Time: 05/25/24  8:02 AM  Result Value Ref Range   Glucose-Capillary 156 (H) 70 - 99 mg/dL    Comment: Glucose reference range applies only to samples taken after fasting for at least 8 hours.   Comment  1 Notify RN    Comment 2 Document in Chart   Glucose, capillary     Status: Abnormal   Collection Time: 05/25/24 11:47 AM  Result Value Ref Range   Glucose-Capillary 202 (H) 70 - 99 mg/dL    Comment: Glucose reference range applies only to samples taken after fasting for at least 8 hours.   Comment 1 Notify RN    Comment 2 Document in Chart   Glucose, capillary     Status: Abnormal   Collection Time: 05/25/24  5:05 PM  Result Value Ref Range   Glucose-Capillary 159 (H)  70 - 99 mg/dL    Comment: Glucose reference range applies only to samples taken after fasting for at least 8 hours.   Comment 1 Notify RN    Comment 2 Document in Chart   Glucose, capillary     Status: Abnormal   Collection Time: 05/25/24  9:10 PM  Result Value Ref Range   Glucose-Capillary 254 (H) 70 - 99 mg/dL    Comment: Glucose reference range applies only to samples taken after fasting for at least 8 hours.  Basic metabolic panel with GFR     Status: Abnormal   Collection Time: 05/26/24  4:42 AM  Result Value Ref Range   Sodium 139 135 - 145 mmol/L   Potassium 4.3 3.5 - 5.1 mmol/L   Chloride 103 98 - 111 mmol/L   CO2 27 22 - 32 mmol/L   Glucose, Bld 170 (H) 70 - 99 mg/dL    Comment: Glucose reference range applies only to samples taken after fasting for at least 8 hours.   BUN 39 (H) 8 - 23 mg/dL   Creatinine, Ser 8.45 (H) 0.44 - 1.00 mg/dL   Calcium  9.4 8.9 - 10.3 mg/dL   GFR, Estimated 35 (L) >60 mL/min    Comment: (NOTE) Calculated using the CKD-EPI Creatinine Equation (2021)    Anion gap 9 5 - 15    Comment: Performed at [facility], [address] [place], [place] [address]  CBC     Status: Abnormal   Collection Time: 05/26/24  4:42 AM  Result Value Ref Range   WBC 9.3 4.0 - 10.5 K/uL   RBC 3.62 (L) 3.87 - 5.11 MIL/uL   Hemoglobin 10.6 (L) 12.0 - 15.0 g/dL   HCT 67.2 (L) 63.9 - 53.9 %   MCV 90.3 80.0 - 100.0 fL   MCH 29.3 26.0 - 34.0 pg   MCHC 32.4 30.0 - 36.0 g/dL   RDW 85.8 88.4 - 84.4 %   Platelets 231 150 - 400 K/uL   nRBC 0.0 0.0 - 0.2 %    Comment: Performed at [facility], [address] [place], [place] [address]  Magnesium      Status: Abnormal   Collection Time: 05/26/24  4:42 AM  Result Value Ref Range   Magnesium  2.5 (H) 1.7 - 2.4 mg/dL    Comment: Performed at [facility], [address], [place], [place] [address]  Phosphorus     Status: None   Collection Time: 05/26/24  4:42 AM  Result Value Ref Range    Phosphorus 3.8 2.5 - 4.6 mg/dL    Comment: Performed at [facility], [address] [place], [place] [address]  Glucose, capillary     Status: Abnormal   Collection Time: 05/26/24  8:34 AM  Result Value Ref Range   Glucose-Capillary 186 (H) 70 - 99 mg/dL    Comment: Glucose reference range  applies only to samples taken after fasting for at least 8 hours.   Comment 1 Notify RN    Comment 2 Document in Chart   Glucose, capillary     Status: Abnormal   Collection Time: 05/26/24 12:16 PM  Result Value Ref Range   Glucose-Capillary 246 (H) 70 - 99 mg/dL    Comment: Glucose reference range applies only to samples taken after fasting for at least 8 hours.  Glucose, capillary     Status: Abnormal   Collection Time: 05/26/24  4:32 PM  Result Value Ref Range   Glucose-Capillary 202 (H) 70 - 99 mg/dL    Comment: Glucose reference range applies only to samples taken after fasting for at least 8 hours.  Glucose, capillary     Status: Abnormal   Collection Time: 05/26/24  9:06 PM  Result Value Ref Range   Glucose-Capillary 198 (H) 70 - 99 mg/dL    Comment: Glucose reference range applies only to samples taken after fasting for at least 8 hours.  Basic metabolic panel with GFR     Status: Abnormal   Collection Time: 05/27/24  4:16 AM  Result Value Ref Range   Sodium 137 135 - 145 mmol/L   Potassium 4.3 3.5 - 5.1 mmol/L   Chloride 103 98 - 111 mmol/L   CO2 24 22 - 32 mmol/L   Glucose, Bld 192 (H) 70 - 99 mg/dL    Comment: Glucose reference range applies only to samples taken after fasting for at least 8 hours.   BUN 41 (H) 8 - 23 mg/dL   Creatinine, Ser 8.55 (H) 0.44 - 1.00 mg/dL   Calcium  9.3 8.9 - 10.3 mg/dL   GFR, Estimated 38 (L) >60 mL/min    Comment: (NOTE) Calculated using the CKD-EPI Creatinine Equation (2021)    Anion gap 10 5 - 15    Comment: Performed at [facility], [address] [place], [place] [address]  CBC     Status: Abnormal   Collection Time:  05/27/24  4:16 AM  Result Value Ref Range   WBC 11.6 (H) 4.0 - 10.5 K/uL   RBC 3.62 (L) 3.87 - 5.11 MIL/uL   Hemoglobin 10.7 (L) 12.0 - 15.0 g/dL   HCT 67.8 (L) 63.9 - 53.9 %   MCV 88.7 80.0 - 100.0 fL   MCH 29.6 26.0 - 34.0 pg   MCHC 33.3 30.0 - 36.0 g/dL   RDW 85.9 88.4 - 84.4 %   Platelets 256 150 - 400 K/uL   nRBC 0.0 0.0 - 0.2 %    Comment: Performed at [facility], [address] [place], [place] [address]  Glucose, capillary     Status: Abnormal   Collection Time: 05/27/24  8:01 AM  Result Value Ref Range   Glucose-Capillary 184 (H) 70 - 99 mg/dL    Comment: Glucose reference range applies only to samples taken after fasting for at least 8 hours.   Comment 1 Notify RN    Comment 2 Document in Chart   Glucose, capillary     Status: Abnormal   Collection Time: 05/27/24 11:25 AM  Result Value Ref Range   Glucose-Capillary 252 (H) 70 - 99 mg/dL    Comment: Glucose reference range applies only to samples taken after fasting for at least 8 hours.  Glucose, capillary     Status: Abnormal   Collection Time: 05/27/24  5:03 PM  Result Value Ref Range   Glucose-Capillary 221 (H) 70 - 99  mg/dL    Comment: Glucose reference range applies only to samples taken after fasting for at least 8 hours.  Glucose, capillary     Status: Abnormal   Collection Time: 05/27/24  9:13 PM  Result Value Ref Range   Glucose-Capillary 231 (H) 70 - 99 mg/dL    Comment: Glucose reference range applies only to samples taken after fasting for at least 8 hours.  Basic metabolic panel with GFR     Status: Abnormal   Collection Time: 05/28/24  4:39 AM  Result Value Ref Range   Sodium 138 135 - 145 mmol/L   Potassium 4.5 3.5 - 5.1 mmol/L   Chloride 98 98 - 111 mmol/L   CO2 26 22 - 32 mmol/L   Glucose, Bld 227 (H) 70 - 99 mg/dL    Comment: Glucose reference range applies only to samples taken after fasting for at least 8 hours.   BUN 41 (H) 8 - 23 mg/dL   Creatinine, Ser 8.35 (H) 0.44 - 1.00  mg/dL   Calcium  10.1 8.9 - 10.3 mg/dL   GFR, Estimated 32 (L) >60 mL/min    Comment: (NOTE) Calculated using the CKD-EPI Creatinine Equation (2021)    Anion gap 14 5 - 15    Comment: Performed at [facility], [address] [place], [place] [address]  CBC     Status: Abnormal   Collection Time: 05/28/24  4:39 AM  Result Value Ref Range   WBC 19.6 (H) 4.0 - 10.5 K/uL   RBC 4.38 3.87 - 5.11 MIL/uL   Hemoglobin 13.0 12.0 - 15.0 g/dL   HCT 61.1 63.9 - 53.9 %   MCV 88.6 80.0 - 100.0 fL   MCH 29.7 26.0 - 34.0 pg   MCHC 33.5 30.0 - 36.0 g/dL   RDW 85.8 88.4 - 84.4 %   Platelets 362 150 - 400 K/uL   nRBC 0.0 0.0 - 0.2 %    Comment: Performed at [facility], [address] [place], [place] [address]  Glucose, capillary     Status: Abnormal   Collection Time: 05/28/24  8:47 AM  Result Value Ref Range   Glucose-Capillary 229 (H) 70 - 99 mg/dL    Comment: Glucose reference range applies only to samples taken after fasting for at least 8 hours.   Comment 1 Notify RN    Comment 2 Document in Chart   Basic metabolic panel with GFR     Status: Abnormal   Collection Time: 05/28/24 10:10 AM  Result Value Ref Range   Sodium 136 135 - 145 mmol/L   Potassium 4.7 3.5 - 5.1 mmol/L   Chloride 99 98 - 111 mmol/L   CO2 24 22 - 32 mmol/L   Glucose, Bld 243 (H) 70 - 99 mg/dL    Comment: Glucose reference range applies only to samples taken after fasting for at least 8 hours.   BUN 40 (H) 8 - 23 mg/dL   Creatinine, Ser 8.48 (H) 0.44 - 1.00 mg/dL   Calcium  9.7 8.9 - 10.3 mg/dL   GFR, Estimated 36 (L) >60 mL/min    Comment: (NOTE) Calculated using the CKD-EPI Creatinine Equation (2021)    Anion gap 13 5 - 15    Comment: Performed at [facility], [address] [place], [place] [address]  CBC     Status: Abnormal   Collection Time: 05/28/24 10:10 AM  Result Value Ref Range   WBC 22.6 (H) 4.0 - 10.5 K/uL  RBC 4.19 3.87 - 5.11 MIL/uL   Hemoglobin 12.2 12.0 -  15.0 g/dL   HCT 63.2 63.9 - 53.9 %   MCV 87.6 80.0 - 100.0 fL   MCH 29.1 26.0 - 34.0 pg   MCHC 33.2 30.0 - 36.0 g/dL   RDW 85.7 88.4 - 84.4 %   Platelets 351 150 - 400 K/uL   nRBC 0.0 0.0 - 0.2 %    Comment: Performed at [facility], [address] [place], [place] [address]  Procalcitonin     Status: None   Collection Time: 05/28/24 10:10 AM  Result Value Ref Range   Procalcitonin 0.11 ng/mL    Comment:        Interpretation: PCT (Procalcitonin) <= 0.5 ng/mL: Systemic infection (sepsis) is not likely. Local bacterial infection is possible. (NOTE)       Sepsis PCT Algorithm           Lower Respiratory Tract                                      Infection PCT Algorithm    ----------------------------     ----------------------------         PCT < 0.25 ng/mL                PCT < 0.10 ng/mL          Strongly encourage             Strongly discourage   discontinuation of antibiotics    initiation of antibiotics    ----------------------------     -----------------------------       PCT 0.25 - 0.50 ng/mL            PCT 0.10 - 0.25 ng/mL               OR       >80% decrease in PCT            Discourage initiation of                                            antibiotics      Encourage discontinuation           of antibiotics    ----------------------------     -----------------------------         PCT >= 0.50 ng/mL              PCT 0.26 - 0.50 ng/mL               AND        <80% decrease in PCT             Encourage initiation of                                             antibiotics       Encourage continuation           of antibiotics    ----------------------------     -----------------------------        PCT >= 0.50 ng/mL                  PCT >  0.50 ng/mL               AND         increase in PCT                  Strongly encourage                                      initiation of antibiotics    Strongly encourage escalation           of antibiotics                                      -----------------------------                                           PCT <= 0.25 ng/mL                                                 OR                                        > 80% decrease in PCT                                      Discontinue / Do not initiate                                             antibiotics  Performed at [facility], [address] [place], [place] [address]   Glucose, capillary     Status: Abnormal   Collection Time: 05/28/24 11:14 AM  Result Value Ref Range   Glucose-Capillary 221 (H) 70 - 99 mg/dL    Comment: Glucose reference range applies only to samples taken after fasting for at least 8 hours.   Comment 1 Notify RN    Comment 2 Document in Chart   Glucose, capillary     Status: Abnormal   Collection Time: 05/28/24  4:52 PM  Result Value Ref Range   Glucose-Capillary 186 (H) 70 - 99 mg/dL    Comment: Glucose reference range applies only to samples taken after fasting for at least 8 hours.   Comment 1 Notify RN    Comment 2 Document in Chart   Glucose, capillary     Status: Abnormal   Collection Time: 05/28/24  8:21 PM  Result Value Ref Range   Glucose-Capillary 219 (H) 70 - 99 mg/dL    Comment: Glucose reference range applies only to samples taken after fasting for at least 8 hours.  Glucose, capillary     Status: Abnormal   Collection Time: 05/29/24  8:19 AM  Result Value Ref Range   Glucose-Capillary 222 (H) 70 - 99 mg/dL    Comment: Glucose reference range  applies only to samples taken after fasting for at least 8 hours.   Comment 1 Notify RN    Comment 2 Document in Chart   Glucose, capillary     Status: Abnormal   Collection Time: 05/29/24 12:12 PM  Result Value Ref Range   Glucose-Capillary 242 (H) 70 - 99 mg/dL    Comment: Glucose reference range applies only to samples taken after fasting for at least 8 hours.   Comment 1 Notify RN    Comment 2 Document in Chart   Glucose, capillary      Status: Abnormal   Collection Time: 05/29/24  5:33 PM  Result Value Ref Range   Glucose-Capillary 256 (H) 70 - 99 mg/dL    Comment: Glucose reference range applies only to samples taken after fasting for at least 8 hours.  Glucose, capillary     Status: Abnormal   Collection Time: 05/29/24  9:18 PM  Result Value Ref Range   Glucose-Capillary 249 (H) 70 - 99 mg/dL    Comment: Glucose reference range applies only to samples taken after fasting for at least 8 hours.  CBC     Status: Abnormal   Collection Time: 05/30/24  4:57 AM  Result Value Ref Range   WBC 14.2 (H) 4.0 - 10.5 K/uL   RBC 3.95 3.87 - 5.11 MIL/uL   Hemoglobin 11.6 (L) 12.0 - 15.0 g/dL   HCT 65.7 (L) 63.9 - 53.9 %   MCV 86.6 80.0 - 100.0 fL   MCH 29.4 26.0 - 34.0 pg   MCHC 33.9 30.0 - 36.0 g/dL   RDW 85.8 88.4 - 84.4 %   Platelets 311 150 - 400 K/uL   nRBC 0.0 0.0 - 0.2 %    Comment: Performed at [facility], [address] [place], [place] [address]  Basic metabolic panel with GFR     Status: Abnormal   Collection Time: 05/30/24  4:57 AM  Result Value Ref Range   Sodium 134 (L) 135 - 145 mmol/L   Potassium 3.8 3.5 - 5.1 mmol/L   Chloride 97 (L) 98 - 111 mmol/L   CO2 25 22 - 32 mmol/L   Glucose, Bld 184 (H) 70 - 99 mg/dL    Comment: Glucose reference range applies only to samples taken after fasting for at least 8 hours.   BUN 48 (H) 8 - 23 mg/dL   Creatinine, Ser 8.19 (H) 0.44 - 1.00 mg/dL   Calcium  9.4 8.9 - 10.3 mg/dL   GFR, Estimated 29 (L) >60 mL/min    Comment: (NOTE) Calculated using the CKD-EPI Creatinine Equation (2021)    Anion gap 12 5 - 15    Comment: Performed at [facility], [address] [place], [place] [address]  Glucose, capillary     Status: Abnormal   Collection Time: 05/30/24  8:01 AM  Result Value Ref Range   Glucose-Capillary 202 (H) 70 - 99 mg/dL    Comment: Glucose reference range applies only to samples taken after fasting for at least 8 hours.  Glucose,  capillary     Status: Abnormal   Collection Time: 05/30/24 11:38 AM  Result Value Ref Range   Glucose-Capillary 264 (H) 70 - 99 mg/dL    Comment: Glucose reference range applies only to samples taken after fasting for at least 8 hours.  Glucose, capillary     Status: Abnormal   Collection Time: 05/30/24  4:46 PM  Result Value Ref Range   Glucose-Capillary 212 (H) 70 -  99 mg/dL    Comment: Glucose reference range applies only to samples taken after fasting for at least 8 hours.  Glucose, capillary     Status: Abnormal   Collection Time: 05/30/24  8:49 PM  Result Value Ref Range   Glucose-Capillary 251 (H) 70 - 99 mg/dL    Comment: Glucose reference range applies only to samples taken after fasting for at least 8 hours.  CBC     Status: Abnormal   Collection Time: 05/31/24  6:44 AM  Result Value Ref Range   WBC 16.0 (H) 4.0 - 10.5 K/uL   RBC 4.33 3.87 - 5.11 MIL/uL   Hemoglobin 12.5 12.0 - 15.0 g/dL   HCT 62.0 63.9 - 53.9 %   MCV 87.5 80.0 - 100.0 fL   MCH 28.9 26.0 - 34.0 pg   MCHC 33.0 30.0 - 36.0 g/dL   RDW 85.9 88.4 - 84.4 %   Platelets 427 (H) 150 - 400 K/uL   nRBC 0.0 0.0 - 0.2 %    Comment: Performed at [facility], [address] [place], [place] [address]  Magnesium      Status: Abnormal   Collection Time: 05/31/24  6:44 AM  Result Value Ref Range   Magnesium  2.6 (H) 1.7 - 2.4 mg/dL    Comment: Performed at [facility], [address] [place], [place] [address]  Basic metabolic panel with GFR     Status: Abnormal   Collection Time: 05/31/24  6:44 AM  Result Value Ref Range   Sodium 136 135 - 145 mmol/L   Potassium 3.7 3.5 - 5.1 mmol/L   Chloride 97 (L) 98 - 111 mmol/L   CO2 26 22 - 32 mmol/L   Glucose, Bld 186 (H) 70 - 99 mg/dL    Comment: Glucose reference range applies only to samples taken after fasting for at least 8 hours.   BUN 48 (H) 8 - 23 mg/dL   Creatinine, Ser 8.17 (H) 0.44 - 1.00 mg/dL   Calcium  9.5 8.9 - 10.3 mg/dL   GFR,  Estimated 29 (L) >60 mL/min    Comment: (NOTE) Calculated using the CKD-EPI Creatinine Equation (2021)    Anion gap 13 5 - 15    Comment: Performed at [facility], [address] [place], [place] [address]  Phosphorus     Status: None   Collection Time: 05/31/24  6:44 AM  Result Value Ref Range   Phosphorus 4.0 2.5 - 4.6 mg/dL    Comment: Performed at [facility], [address] [place], [place] [address]  Glucose, capillary     Status: Abnormal   Collection Time: 05/31/24  8:20 AM  Result Value Ref Range   Glucose-Capillary 230 (H) 70 - 99 mg/dL    Comment: Glucose reference range applies only to samples taken after fasting for at least 8 hours.   Comment 1 Notify RN    Comment 2 Document in Chart   Glucose, capillary     Status: Abnormal   Collection Time: 05/31/24 11:06 AM  Result Value Ref Range   Glucose-Capillary 310 (H) 70 - 99 mg/dL    Comment: Glucose reference range applies only to samples taken after fasting for at least 8 hours.   Comment 1 Notify RN    Comment 2 Document in Chart     Diabetic Foot Exam:     PHQ2/9:    06/02/2024    9:58 AM 04/04/2024    2:35 PM 10/23/2023  1:34 PM 07/19/2023   11:25 AM 06/18/2023    3:39 PM  Depression screen PHQ 2/9  Decreased Interest 0 1 3 0 0  Down, Depressed, Hopeless 0 1 3 0 0  PHQ - 2 Score 0 2 6 0 0  Altered sleeping 0 2 3 0 0  Tired, decreased energy 0 3 3 0 0  Change in appetite 0 0 3 0 0  Feeling bad or failure about yourself  0 1 0 0 0  Trouble concentrating 0 0 3 0 0  Moving slowly or fidgety/restless 0 0 0 0 0  Suicidal thoughts 0 0 0 0 0  PHQ-9 Score 0 8 18 0 0  Difficult doing work/chores Not difficult at all Somewhat difficult Very difficult      phq 9 is negative  Fall Risk:    06/02/2024    9:47 AM 10/23/2023    1:33 PM 07/19/2023   11:25 AM 06/18/2023    3:39 PM 05/17/2023   10:56 AM  Fall Risk   Falls in the past year? 0 0 1 1 1   Number falls in past yr: 0 0 1 1 1    Injury with Fall? 0 0 1 1 1   Risk for fall due to : No Fall Risks No Fall Risks History of fall(s);Impaired balance/gait Impaired balance/gait Impaired balance/gait  Follow up Falls evaluation completed Falls prevention discussed;Education provided;Falls evaluation completed Falls prevention discussed;Education provided;Falls evaluation completed Falls prevention discussed Falls prevention discussed;Education provided;Falls evaluation completed     Assessment & Plan Sepsis due to gram-negative urinary tract infection Sepsis secondary to gram-negative UTI treated with IV antibiotics. Discharged with Augmentin , not yet started. Elevated WBC at discharge. - Pick up and start Augmentin  immediately. - Order CBC to monitor WBC count. - Monitor for signs of infection: fever, chills, dysuria.  Acute on chronic kidney disease stage 3A Acute on chronic kidney disease stage 3A with decreased GFR during hospitalization, improved before discharge. Dehydration noted. - Order BMP to monitor kidney function.  Stercoral colitis with proctitis Stercoral colitis with proctitis treated with antibiotics and laxatives. Diarrhea improving but present. - Monitor for abdominal pain or hematochezia.  Type 2 diabetes mellitus, uncontrolled Uncontrolled type 2 diabetes with elevated blood glucose during hospitalization. Not monitoring at home. - Set up and use blood glucose meter. - Continue medications used pre admission and follow up with Endo  - Monitor for signs of hyperglycemia.  Status post left total knee replacement for primary osteoarthritis of left knee Post left total knee replacement on May 19, 2024. No swelling or significant pain. No physical therapy post-discharge. - Follow up with orthopedic surgeon tomorrow. - Monitor for increased pain or swelling and if needed doppler US  tomorrow after evaluation by Ortho  - Ensure initiation of physical therapy.

## 2024-06-03 ENCOUNTER — Ambulatory Visit: Payer: Self-pay | Admitting: Family Medicine

## 2024-06-03 LAB — COMPREHENSIVE METABOLIC PANEL WITH GFR
AG Ratio: 1.4 (calc) (ref 1.0–2.5)
ALT: 13 U/L (ref 6–29)
AST: 14 U/L (ref 10–35)
Albumin: 3.9 g/dL (ref 3.6–5.1)
Alkaline phosphatase (APISO): 90 U/L (ref 37–153)
BUN/Creatinine Ratio: 23 (calc) — ABNORMAL HIGH (ref 6–22)
BUN: 38 mg/dL — ABNORMAL HIGH (ref 7–25)
CO2: 26 mmol/L (ref 20–32)
Calcium: 9.8 mg/dL (ref 8.6–10.4)
Chloride: 99 mmol/L (ref 98–110)
Creat: 1.68 mg/dL — ABNORMAL HIGH (ref 0.60–1.00)
Globulin: 2.7 g/dL (ref 1.9–3.7)
Glucose, Bld: 264 mg/dL — ABNORMAL HIGH (ref 65–99)
Potassium: 4.3 mmol/L (ref 3.5–5.3)
Sodium: 137 mmol/L (ref 135–146)
Total Bilirubin: 0.4 mg/dL (ref 0.2–1.2)
Total Protein: 6.6 g/dL (ref 6.1–8.1)
eGFR: 32 mL/min/1.73m2 — ABNORMAL LOW (ref >=60)

## 2024-06-03 LAB — CBC WITH DIFFERENTIAL/PLATELET
Absolute Lymphocytes: 2345 {cells}/uL (ref 850–3900)
Absolute Monocytes: 1427 {cells}/uL — ABNORMAL HIGH (ref 200–950)
Basophils Absolute: 164 {cells}/uL (ref 0–200)
Basophils Relative: 1 %
Eosinophils Absolute: 443 {cells}/uL (ref 15–500)
Eosinophils Relative: 2.7 %
HCT: 38.9 % (ref 35.0–45.0)
Hemoglobin: 12.4 g/dL (ref 11.7–15.5)
MCH: 28.8 pg (ref 27.0–33.0)
MCHC: 31.9 g/dL — ABNORMAL LOW (ref 32.0–36.0)
MCV: 90.5 fL (ref 80.0–100.0)
MPV: 10.8 fL (ref 7.5–12.5)
Monocytes Relative: 8.7 %
Neutro Abs: 12021 {cells}/uL — ABNORMAL HIGH (ref 1500–7800)
Neutrophils Relative %: 73.3 %
Platelets: 432 Thousand/uL — ABNORMAL HIGH (ref 140–400)
RBC: 4.3 Million/uL (ref 3.80–5.10)
RDW: 13.9 % (ref 11.0–15.0)
Total Lymphocyte: 14.3 %
WBC: 16.4 Thousand/uL — ABNORMAL HIGH (ref 3.8–10.8)

## 2024-06-03 NOTE — Telephone Encounter (Signed)
Anna aware

## 2024-06-04 ENCOUNTER — Telehealth: Payer: Self-pay | Admitting: Family Medicine

## 2024-06-04 NOTE — Telephone Encounter (Signed)
 Copied from CRM #[id number]. Topic: Clinical - Lab/Test Results >> Jun 04, 2024 11:46 AM Zebedee SAUNDERS wrote: Reason for CRM: Pt returning Renteria-Garcia, Maricela, CMA call regarding lab results which were provided. Pt wants to know why white count is high? Please call pt at [phone]

## 2024-06-04 NOTE — Telephone Encounter (Signed)
 Called pt back and informed her WBC can be elevated due to some type of infection. Advised her if she is not feeling well to make she seeks ER attention as per PCP recommended.

## 2024-06-18 DIAGNOSIS — Z008 Encounter for other general examination: Secondary | ICD-10-CM | POA: Diagnosis not present

## 2024-06-18 DIAGNOSIS — E1169 Type 2 diabetes mellitus with other specified complication: Secondary | ICD-10-CM | POA: Diagnosis not present

## 2024-06-18 DIAGNOSIS — Z794 Long term (current) use of insulin: Secondary | ICD-10-CM | POA: Diagnosis not present

## 2024-06-19 ENCOUNTER — Encounter: Payer: Self-pay | Admitting: Internal Medicine

## 2024-06-19 ENCOUNTER — Ambulatory Visit: Admitting: Internal Medicine

## 2024-06-19 VITALS — BP 120/60 | HR 71 | Temp 99.2°F | Ht 62.0 in | Wt 221.0 lb

## 2024-06-19 DIAGNOSIS — Z6841 Body Mass Index (BMI) 40.0 and over, adult: Secondary | ICD-10-CM | POA: Diagnosis not present

## 2024-06-19 DIAGNOSIS — J449 Chronic obstructive pulmonary disease, unspecified: Secondary | ICD-10-CM

## 2024-06-19 DIAGNOSIS — G4733 Obstructive sleep apnea (adult) (pediatric): Secondary | ICD-10-CM

## 2024-06-19 DIAGNOSIS — K219 Gastro-esophageal reflux disease without esophagitis: Secondary | ICD-10-CM | POA: Diagnosis not present

## 2024-06-19 MED ORDER — ADVAIR HFA 230-21 MCG/ACT IN AERO: 2.0000 | INHALATION_SPRAY | Freq: Two times a day (BID) | RESPIRATORY_TRACT | 12 refills | Status: AC

## 2024-06-19 NOTE — Progress Notes (Signed)
 [facility] Pulmonary Medicine Consultation     **Spirometry 10/31/2018>> tracings personally reviewed.  FVC is 64% predicted, FEV1 is 60% predicted.  Ratio is 70%.  Expiratory time is 6.1 seconds.  Overall this test is consistent with moderate obstructive lung disease. **Echo 07/04/17; EF=70%>> RV systolic function reported as normal **08/18/16 CBC>> Eos=438 **05/12/14; Echo>> EF= 55%; Pulm systolic pressure was normal.   CC Follow-up assessment of COPD Follow-up assessment for OSA    HPI 73 year old morbidly obese white female with persistent dyspnea on exertion  Assessment COPD Previous history of Breztri  therapy Patient is currently not on any inhaler therapy Patient with moderate COPD FEV1 60% predicted Patient with slightly increased Intermittent wheezing and chest congestion Start Advair HFA If symptoms get worse recommend letting us  know Patient with recent surgery for knee replacement Patient is at high risk for pulmonary embolism however with normal oxygen  levels at this time with normal vital signs with ongoing chest congestion and intermittent wheezing likely diagnosis is COPD Recommend overnight pulse oximetry to assess for hypoxia  No exacerbation at this time No evidence of heart failure at this time No evidence or signs of infection at this time No respiratory distress No fevers, chills, nausea, vomiting, diarrhea No evidence of lower extremity edema No evidence hemoptysis  Assessment of sleep apnea Patient of does have a history of sleep apnea but is intolerant of CPAP Patient has gained significant amount of weight over the last several years   Patient with a previous history of spinal meningitis and COVID  CXR 07/16/17>>Bibasilar atelectasis.   Medication:    Current Outpatient Medications:    acetaminophen  (TYLENOL ) 500 MG tablet, Take 2 tablets (1,000 mg total) by mouth every 8 (eight) hours., Disp: 30 tablet, Rfl: 0   ALBUTEROL  IN, Inhale 2  puffs into the lungs as needed., Disp: , Rfl:    amLODipine  (NORVASC ) 5 MG tablet, Take 1 tablet (5 mg total) by mouth daily., Disp: 30 tablet, Rfl: 5   ARIPiprazole  (ABILIFY ) 2 MG tablet, Take 1 tablet (2 mg total) by mouth every evening., Disp: 30 tablet, Rfl: 5   ASPIRIN  LOW DOSE 81 MG EC tablet, TAKE 1 TABLET (81 MG TOTAL) BY MOUTH DAILY., Disp: 30 tablet, Rfl: 0   B-D ULTRAFINE III SHORT PEN 31G X 8 MM MISC, , Disp: , Rfl:    bisacodyl  (DULCOLAX) 5 MG EC tablet, Take 2 tablets (10 mg total) by mouth at bedtime., Disp: 30 tablet, Rfl: 0   Boswellia-Glucosamine-Vit D (OSTEO BI-FLEX ONE PER DAY PO), Take 1 capsule by mouth every morning., Disp: , Rfl:    buPROPion  (WELLBUTRIN  XL) 150 MG 24 hr tablet, Take 1 tablet (150 mg total) by mouth daily., Disp: 30 tablet, Rfl: 5   chlorhexidine  (HIBICLENS ) 4 % external liquid, Apply 15 mLs (1 Application total) topically as directed for 30 doses. Use as directed daily for 5 days every other week for 6 weeks., Disp: 946 mL, Rfl: 1   Cholecalciferol  (VITAMIN D3) 25 MCG (1000 UT) CAPS, Take 1,000 Units by mouth daily. (Patient taking differently: Take 2,000 Units by mouth 2 (two) times daily.), Disp: , Rfl:    Continuous Blood Gluc Sensor (FREESTYLE LIBRE 2 SENSOR) MISC, FOR GLUCOSE MONITORING, Disp: , Rfl:    docusate sodium  (COLACE) 100 MG capsule, Take 1 capsule (100 mg total) by mouth 2 (two) times daily., Disp: 10 capsule, Rfl: 0   enoxaparin  (LOVENOX ) 40 MG/0.4ML injection, Inject 0.4 mLs (40 mg total) into the skin daily for  14 days., Disp: 5.6 mL, Rfl: 0   ezetimibe  (ZETIA ) 10 MG tablet, Take 1 tablet (10 mg total) by mouth daily., Disp: 30 tablet, Rfl: 5   FARXIGA  10 MG TABS tablet, Take 1 tablet (10 mg total) by mouth every morning., Disp: 30 tablet, Rfl: 5   furosemide  (LASIX ) 20 MG tablet, Take 1 tablet (20 mg total) by mouth daily., Disp: 30 tablet, Rfl: 5   insulin  aspart (NOVOLOG  FLEXPEN) 100 UNIT/ML FlexPen, Inject 14 Units into the skin in the  morning and at bedtime. (Patient taking differently: Inject 14 Units into the skin daily.), Disp: , Rfl:    Lactobacillus-Inulin (CULTURELLE DIGESTIVE DAILY PO), Take 15 Billion Cells by mouth daily., Disp: , Rfl:    metoprolol  tartrate (LOPRESSOR ) 25 MG tablet, Take 0.5 tablets (12.5 mg total) by mouth 2 (two) times daily., Disp: 60 tablet, Rfl: 0   Misc Natural Products (OSTEO BI-FLEX ADV JOINT SHIELD PO), Take by mouth 2 (two) times daily. (Patient taking differently: Take by mouth daily.), Disp: , Rfl:    omeprazole  (PRILOSEC) 40 MG capsule, Take 1 capsule (40 mg total) by mouth daily., Disp: 30 capsule, Rfl: 5   ondansetron  (ZOFRAN ) 4 MG tablet, Take 1 tablet (4 mg total) by mouth every 6 (six) hours as needed for nausea., Disp: 20 tablet, Rfl: 0   oxyCODONE  (OXY IR/ROXICODONE ) 5 MG immediate release tablet, Take 0.5-1 tablets (2.5-5 mg total) by mouth every 4 (four) hours as needed for moderate pain (pain score 4-6)., Disp: 30 tablet, Rfl: 0   OZEMPIC , 1 MG/DOSE, 4 MG/3ML SOPN, Inject 1 mg into the skin once a week., Disp: , Rfl:    polyethylene glycol (MIRALAX  / GLYCOLAX ) 17 g packet, Take 17 g by mouth 2 (two) times daily., Disp: 60 packet, Rfl: 0   pregabalin  (LYRICA ) 150 MG capsule, Take 1 capsule (150 mg total) by mouth 3 (three) times daily. (Patient taking differently: Take 150 mg by mouth 2 (two) times daily.), Disp: 90 capsule, Rfl: 5   psyllium (METAMUCIL) 58.6 % powder, Take 1 packet by mouth daily., Disp: , Rfl:    rosuvastatin  (CRESTOR ) 40 MG tablet, Take 1 tablet (40 mg total) by mouth daily., Disp: 30 tablet, Rfl: 5   sertraline  (ZOLOFT ) 100 MG tablet, Take 1.5 tablets (150 mg total) by mouth daily., Disp: 45 tablet, Rfl: 5   traZODone  (DESYREL ) 100 MG tablet, Take 1 tablet (100 mg total) by mouth at bedtime as needed. for sleep (Patient taking differently: Take 150 mg by mouth at bedtime. for sleep), Disp: 30 tablet, Rfl: 5   TRESIBA FLEXTOUCH 200 UNIT/ML FlexTouch Pen, 85 Units  daily. In morning, Disp: , Rfl:    VASCEPA  1 g capsule, Take 2 capsules (2 g total) by mouth 2 (two) times daily., Disp: 120 capsule, Rfl: 5   Allergies:  Codeine, Atorvastatin, Hydrocodone , Tramadol , Latex, and Zolpidem      BP 120/60   Pulse 71   Temp 99.2 F (37.3 C)   Ht 5' 2 (1.575 m)   Wt 221 lb (100.2 kg)   SpO2 96%   BMI 40.42 kg/m     Review of Systems: Gen:  Denies  fever, sweats, chills weight loss  HEENT: Denies blurred vision, double vision, ear pain, eye pain, hearing loss, nose bleeds, sore throat Cardiac:  No dizziness, chest pain or heaviness, chest tightness,edema, No JVD Resp:   No cough, -sputum production, +shortness of breath,-wheezing, -hemoptysis,  Other:  All other systems negative   Physical Examination:  General Appearance: No distress  EYES PERRLA, EOM intact.   NECK Supple, No JVD Pulmonary: normal breath sounds, No wheezing.  CardiovascularNormal S1,S2.  No m/r/g.   Abdomen: Benign, Soft, non-tender. Neurology UE/LE 5/5 strength, no focal deficits Ext pulses intact, cap refill intact ALL OTHER ROS ARE NEGATIVE   Assessment and Plan:  73 year old pleasant white female seen today for follow-up moderate COPD and noncompliant OSA in the setting of previous diagnosis of COVID infection with spinal meningitis in the setting of morbid obesity and deconditioned state  Moderate COPD Increased wheezing and shortness of breath chest congestion Recommend starting Advair HFA Rinse mouth after every use Continue flutter valve Avoid Allergens and Irritants Avoid secondhand smoke Avoid SICK contacts Recommend  Masking  when appropriate Recommend Keep up-to-date with vaccinations  OSA Patient is intolerant of CPAP High risk for complications  Obesity -recommend significant weight loss -recommend changing diet  Deconditioned state -Recommend increased daily activity and exercise   GERD Continue PPI as prescribed  Recent knee surgery  1 month ago If there is increased shortness of breath and progressive disease recommend CT chest to rule out PE Oxygen  levels in the office today are within normal limits vital signs are stable     MEDICATION ADJUSTMENTS/LABS AND TESTS ORDERED: use flutter valve 10 times per day to assist with coughing Recommend significant weight loss Start Advair HFA 2 puffs in the morning 2 puffs at night Rinse mouth after use Avoid Allergens and Irritants Avoid secondhand smoke Avoid SICK contacts Recommend  Masking  when appropriate Recommend Keep up-to-date with vaccinations CT of the chest to rule out PE if symptoms get worse with shortness of breath  CURRENT MEDICATIONS REVIEWED AT LENGTH WITH PATIENT TODAY   Patient  satisfied with Plan of action and management. All questions answered   Follow up 3 months   I spent a total of 43 minutes dedicated to the care of this patient on the date of this encounter to include pre-visit review of records, face-to-face time with the patient discussing conditions above, post visit ordering of testing, clinical documentation with the electronic health record, making appropriate referrals as documented, and communicating necessary information to the patient's healthcare team.    The Patient requires high complexity decision making for assessment and support, frequent evaluation and titration of therapies, application of advanced monitoring technologies and extensive interpretation of multiple databases.  Patient satisfied with Plan of action and management. All questions answered    Nickolas Alm Cellar, M.D.  [facility]  Medical Director [facility] [place]

## 2024-06-19 NOTE — Patient Instructions (Signed)
 Recommend starting Advair HFA 2 puffs in the morning 2 puffs at night Recommend rinsing mouth after use Recommend obtaining overnight pulse oximetry to check oxygen  levels at nighttime

## 2024-06-27 ENCOUNTER — Telehealth: Payer: Self-pay

## 2024-06-27 NOTE — Telephone Encounter (Signed)
 All rx from PCP has enough refills till January 2026

## 2024-06-27 NOTE — Telephone Encounter (Signed)
 Copied from CRM [id number]. Topic: Clinical - Prescription Issue >> Jun 27, 2024  1:50 PM Charlet HERO wrote: Reason for CRM: patient is stating that [facility] sent her a messag that she needs scripts sent but does not know what meds, wants someone to call and find out which ones.

## 2024-07-01 LAB — HEMOGLOBIN A1C: Hemoglobin A1C: 7.5

## 2024-07-02 DIAGNOSIS — Z7984 Long term (current) use of oral hypoglycemic drugs: Secondary | ICD-10-CM | POA: Diagnosis not present

## 2024-07-02 DIAGNOSIS — E1122 Type 2 diabetes mellitus with diabetic chronic kidney disease: Secondary | ICD-10-CM | POA: Diagnosis not present

## 2024-07-02 DIAGNOSIS — N1832 Chronic kidney disease, stage 3b: Secondary | ICD-10-CM | POA: Diagnosis not present

## 2024-07-02 DIAGNOSIS — D631 Anemia in chronic kidney disease: Secondary | ICD-10-CM | POA: Diagnosis not present

## 2024-07-02 DIAGNOSIS — I10 Essential (primary) hypertension: Secondary | ICD-10-CM | POA: Diagnosis not present

## 2024-07-02 DIAGNOSIS — N2581 Secondary hyperparathyroidism of renal origin: Secondary | ICD-10-CM | POA: Diagnosis not present

## 2024-07-03 ENCOUNTER — Ambulatory Visit

## 2024-07-15 ENCOUNTER — Ambulatory Visit

## 2024-07-29 ENCOUNTER — Other Ambulatory Visit: Payer: Self-pay | Admitting: Family Medicine

## 2024-07-29 DIAGNOSIS — E1169 Type 2 diabetes mellitus with other specified complication: Secondary | ICD-10-CM

## 2024-07-29 NOTE — Telephone Encounter (Unsigned)
 Copied from CRM [id number]. Topic: Clinical - Medication Refill >> Jul 29, 2024  1:19 PM Montie POUR wrote: Medication:  rosuvastatin  (CRESTOR ) 40 MG tablet   Has the patient contacted their pharmacy? Yes (Agent: If no, request that the patient contact the pharmacy for the refill. If patient does not wish to contact the pharmacy document the reason why and proceed with request.) (Agent: If yes, when and what did the pharmacy advise?) Pharmacy needs order to reorder  This is the patient's preferred pharmacy:  [email or web address] - [place], [place] - [address] [place] [address] Phone: [phone] Fax: [phone]  Is this the correct pharmacy for this prescription? Yes If no, delete pharmacy and type the correct one.   Has the prescription been filled recently? No  Is the patient out of the medication? No  Has the patient been seen for an appointment in the last year OR does the patient have an upcoming appointment? Yes  Can we respond through MyChart? No  Agent: Please be advised that Rx refills may take up to 3 business days. We ask that you follow-up with your pharmacy.

## 2024-07-31 NOTE — Telephone Encounter (Signed)
 Requested Prescriptions  Refused Prescriptions Disp Refills   rosuvastatin  (CRESTOR ) 40 MG tablet 30 tablet 5    Sig: Take 1 tablet (40 mg total) by mouth daily.     Cardiovascular:  Antilipid - Statins 2 Failed - 07/31/2024  5:59 PM      Failed - Cr in normal range and within 360 days    Creat  Date Value Ref Range Status  06/02/2024 1.68 (H) 0.60 - 1.00 mg/dL Final   Creatinine, Urine  Date Value Ref Range Status  03/27/2024 60  Final         Failed - Lipid Panel in normal range within the last 12 months    Cholesterol, Total  Date Value Ref Range Status  05/13/2020 209 (H) 100 - 199 mg/dL Final   Cholesterol  Date Value Ref Range Status  06/06/2022 141 0 - 200 Final   LDL Cholesterol (Calc)  Date Value Ref Range Status  10/03/2021 64 mg/dL (calc) Final    Comment:    Reference range: <100 . Desirable range <100 mg/dL for primary prevention;   <70 mg/dL for patients with CHD or diabetic patients  with > or = 2 CHD risk factors. SABRA LDL-C is now calculated using the Martin-Hopkins  calculation, which is a validated novel method providing  better accuracy than the Friedewald equation in the  estimation of LDL-C.  Gladis APPLETHWAITE et al. SANDREA. 7986;689(80): 2061-2068  (http://education.[facility].com/faq/FAQ164)    LDL Cholesterol  Date Value Ref Range Status  06/06/2022 43  Final   HDL  Date Value Ref Range Status  06/06/2022 39 35 - 70 Final  05/13/2020 38 (L) >39 mg/dL Final   Triglycerides  Date Value Ref Range Status  06/06/2022 295 (A) 40 - 160 Final         Passed - Patient is not pregnant      Passed - Valid encounter within last 12 months    Recent Outpatient Visits           1 month ago Hospital discharge follow-up   [facility] Glenard Mire, MD   3 months ago MDD (major depressive disorder), recurrent episode, moderate [facility])   [facility] Glenard Mire, MD   9 months ago Pre-op  evaluation   [facility] Sowles, Krichna, MD

## 2024-08-05 ENCOUNTER — Ambulatory Visit: Payer: Self-pay

## 2024-08-05 NOTE — Telephone Encounter (Signed)
 FYI Only or Action Required?: FYI only for provider: appointment scheduled on 08/12/24.  Patient was last seen in primary care on 06/02/2024 by Glenard Mire, MD.  Called Nurse Triage reporting Knee Pain.  Symptoms began several months ago.  Interventions attempted: OTC medications: Tylenol  and Aspercreme.  Symptoms are: unchanged.  Triage Disposition: See PCP Within 2 Weeks  Patient/caregiver understands and will follow disposition?: Yes                               1. LOCATION and RADIATION: Where is the pain located?      Left knee 2. QUALITY: What does the pain feel like?  (e.g., sharp, dull, aching, burning)     Burning  3. SEVERITY: How bad is the pain? What does it keep you from doing?   (Scale 1-10; or mild, moderate, severe)     Rates pain a 9 in both knees 4. ONSET: When did the pain start? Does it come and go, or is it there all the time?     Left knee for 2 months, right knee for awhile, denies worsening of symptoms 6. SETTING: Has there been any recent work, exercise or other activity that involved that part of the body?      Reports left knee surgery 2 months ago 7. AGGRAVATING FACTORS: What makes the knee pain worse? (e.g., walking, climbing stairs, running)     Getting up and sitting down, states she needs to use her walker to walk 8. ASSOCIATED SYMPTOMS: Is there any swelling or redness of the knee?     Mild redness of left knee, Denies swelling 9. OTHER SYMPTOMS: Do you have any other symptoms? (e.g., calf pain, chest pain, difficulty breathing, fever)     Denies chest pain, denies difficulty breathing, denies fever, denies drainage/bleeding                            Copied from CRM #[id number]. Topic: Clinical - Red Word Triage >> Aug 05, 2024  4:23 PM Joesph NOVAK wrote: Red Word that prompted transfer to Nurse Triage: Patients left knee has been sore, severe pain and can barely walk. When  she stands up, it brings her more pain. Knee replacement 2 months ago.   She also needs her AWV scheduled.  Reason for Disposition  Knee pain is a chronic symptom (recurrent or ongoing AND present > 4 weeks)  Protocols used: Knee Pain-A-AH

## 2024-08-12 ENCOUNTER — Ambulatory Visit: Admitting: Family Medicine

## 2024-08-12 ENCOUNTER — Encounter: Payer: Self-pay | Admitting: Family Medicine

## 2024-08-12 VITALS — BP 136/74 | HR 88 | Resp 16 | Ht 62.0 in | Wt 234.1 lb

## 2024-08-12 DIAGNOSIS — M25562 Pain in left knee: Secondary | ICD-10-CM

## 2024-08-12 DIAGNOSIS — N1832 Chronic kidney disease, stage 3b: Secondary | ICD-10-CM

## 2024-08-12 DIAGNOSIS — E1165 Type 2 diabetes mellitus with hyperglycemia: Secondary | ICD-10-CM

## 2024-08-12 DIAGNOSIS — Z794 Long term (current) use of insulin: Secondary | ICD-10-CM

## 2024-08-12 DIAGNOSIS — M5431 Sciatica, right side: Secondary | ICD-10-CM | POA: Diagnosis not present

## 2024-08-12 DIAGNOSIS — M25561 Pain in right knee: Secondary | ICD-10-CM | POA: Diagnosis not present

## 2024-08-12 DIAGNOSIS — G8929 Other chronic pain: Secondary | ICD-10-CM

## 2024-08-12 MED ORDER — LIDOCAINE 5 % EX PTCH: 1.0000 | MEDICATED_PATCH | CUTANEOUS | 0 refills | Status: DC

## 2024-08-12 MED ORDER — PREDNISONE 10 MG PO TABS: 10.0000 mg | ORAL_TABLET | Freq: Two times a day (BID) | ORAL | 0 refills | Status: DC

## 2024-08-12 NOTE — Progress Notes (Unsigned)
 Name: Summer [name]   MRN: [id number]    DOB: [birth year]   Date:08/12/2024       Progress Note  Subjective  Chief Complaint  Chief Complaint  Patient presents with   Knee Pain    Bilateral, months    Discussed the use of AI scribe software for clinical note transcription with the patient, who gave verbal consent to proceed.  History of Present Illness Summer [name] is a 73 year old female with osteoarthritis who presents with bilateral knee pain and right sciatica.  She experiences persistent pain in her left knee following a total knee replacement on May 19, 2024, at [facility]. The pain is severe, rated as 9 out of 10 when standing and 8 out of 10 when sitting, accompanied by a clicking sensation upon standing. She is taking Tylenol  for pain, which does not alleviate her symptoms. The right knee also requires replacement, but surgery is delayed until after an insurance change to [facility], planned for the beginning of the year.  She reports right-sided sciatica with pain radiating from the lower back down the leg, which began a couple of weeks ago. The pain has eased slightly but persists. She is currently taking pregabalin  150 mg twice daily for diabetes-related nerve pain, which may also help with the sciatica.  She completed a course of oxycodone  post-surgery. Her kidney function dropped slightly in October. Her A1c was 7.5, with current glucose readings averaging 8.5.  She also reports significant hair loss, which she attributes to stress and possibly the recent surgery. She does not currently take thyroid  medication, although she has been prescribed it in the past for similar symptoms.  Her social history includes a recent hospital stay for over a week due to a UTI following her knee surgery.    Patient Active Problem List   Diagnosis Date Noted   Stercoral colitis 05/29/2024   Sepsis due to gram-negative UTI ([name]) 05/22/2024   MDD (major depressive disorder) 05/22/2024    Dyslipidemia 05/22/2024   GERD without esophagitis 05/22/2024   S/P TKR (total knee replacement), left 05/19/2024   Long-term use of aspirin  therapy    Secondary hyperparathyroidism of renal origin 04/04/2024   CKD stage 3a, GFR 45-59 ml/min ([name]) 05/01/2023   Small vessel disease 03/27/2023   Coronary artery disease involving native coronary artery of native heart without angina pectoris 03/16/2023   First degree AV block 03/16/2023   Seizure ([name]) 08/20/2022   Gouty arthritis 08/15/2022   Atherosclerosis of aorta 07/04/2022   Insomnia due to other mental disorder 07/04/2022   Amputated toe of left foot 07/04/2022   Chronic obstructive pulmonary disease ([name]) 01/28/2021   PAD (peripheral artery disease) 12/03/2020   B12 deficiency 05/24/2020   Vitamin D  deficiency 05/24/2020   Metatarsalgia of left foot 01/15/2020   MDD (major depressive disorder), recurrent episode, moderate ([name]) 04/04/2019   (HFpEF) heart failure with preserved ejection fraction ([name]) 08/31/2017   Moderate persistent asthma 08/31/2017   Dyslipidemia associated with type 2 diabetes mellitus ([name]) 06/14/2017   Uncontrolled type 2 diabetes mellitus with hyperglycemia, with long-term current use of insulin  ([name]) 08/18/2016   Charcot foot due to diabetes mellitus ([name]) 02/22/2016   Acquired abduction deformity of foot 07/12/2015   Osteoarthritis of subtalar joint 07/12/2015   Carpal tunnel syndrome 04/17/2015   Chronic constipation 04/17/2015   Stage 3b chronic kidney disease ([name]) 04/17/2015   Decreased exercise tolerance 04/17/2015   Diabetes mellitus with polyneuropathy ([name]) 04/17/2015   Gastroesophageal reflux  disease without esophagitis 04/17/2015   Bursitis, trochanteric 04/17/2015   Essential hypertension 04/17/2015   Hearing loss 04/17/2015   Chronic recurrent major depressive disorder 04/17/2015   Morbid obesity with BMI of 40.0-44.9, adult ([name]) 04/17/2015   Hypo-ovarianism 04/17/2015   Perennial  allergic rhinitis with seasonal variation 04/17/2015   Acne erythematosa 04/17/2015   Dyskinesia, tardive 04/17/2015   Impingement syndrome of shoulder 04/17/2015   Obstructive sleep apnea 05/14/2014   Mixed hyperlipidemia 02/06/2012   Low back pain 09/16/2008    Social History   Tobacco Use   Smoking status: Never   Smokeless tobacco: Never  Substance Use Topics   Alcohol use: No    Alcohol/week: 0.0 standard drinks of alcohol     Current Outpatient Medications:    acetaminophen  (TYLENOL ) 500 MG tablet, Take 2 tablets (1,000 mg total) by mouth every 8 (eight) hours., Disp: 30 tablet, Rfl: 0   ALBUTEROL  IN, Inhale 2 puffs into the lungs as needed., Disp: , Rfl:    amLODipine  (NORVASC ) 5 MG tablet, Take 1 tablet (5 mg total) by mouth daily., Disp: 30 tablet, Rfl: 5   ARIPiprazole  (ABILIFY ) 2 MG tablet, Take 1 tablet (2 mg total) by mouth every evening., Disp: 30 tablet, Rfl: 5   ASPIRIN  LOW DOSE 81 MG EC tablet, TAKE 1 TABLET (81 MG TOTAL) BY MOUTH DAILY., Disp: 30 tablet, Rfl: 0   B-D ULTRAFINE III SHORT PEN 31G X 8 MM MISC, , Disp: , Rfl:    bisacodyl  (DULCOLAX) 5 MG EC tablet, Take 2 tablets (10 mg total) by mouth at bedtime., Disp: 30 tablet, Rfl: 0   Boswellia-Glucosamine-Vit D (OSTEO BI-FLEX ONE PER DAY PO), Take 1 capsule by mouth every morning., Disp: , Rfl:    buPROPion  (WELLBUTRIN  XL) 150 MG 24 hr tablet, Take 1 tablet (150 mg total) by mouth daily., Disp: 30 tablet, Rfl: 5   chlorhexidine  (HIBICLENS ) 4 % external liquid, Apply 15 mLs (1 Application total) topically as directed for 30 doses. Use as directed daily for 5 days every other week for 6 weeks., Disp: 946 mL, Rfl: 1   Cholecalciferol  (VITAMIN D3) 25 MCG (1000 UT) CAPS, Take 1,000 Units by mouth daily. (Patient taking differently: Take 2,000 Units by mouth 2 (two) times daily.), Disp: , Rfl:    Continuous Blood Gluc Sensor (FREESTYLE LIBRE 2 SENSOR) MISC, FOR GLUCOSE MONITORING, Disp: , Rfl:    docusate sodium   (COLACE) 100 MG capsule, Take 1 capsule (100 mg total) by mouth 2 (two) times daily., Disp: 10 capsule, Rfl: 0   ezetimibe  (ZETIA ) 10 MG tablet, Take 1 tablet (10 mg total) by mouth daily., Disp: 30 tablet, Rfl: 5   FARXIGA  10 MG TABS tablet, Take 1 tablet (10 mg total) by mouth every morning., Disp: 30 tablet, Rfl: 5   fluticasone -salmeterol (ADVAIR  HFA) 230-21 MCG/ACT inhaler, Inhale 2 puffs into the lungs 2 (two) times daily., Disp: 12 g, Rfl: 12   furosemide  (LASIX ) 20 MG tablet, Take 1 tablet (20 mg total) by mouth daily., Disp: 30 tablet, Rfl: 5   insulin  aspart (NOVOLOG  FLEXPEN) 100 UNIT/ML FlexPen, Inject 14 Units into the skin in the morning and at bedtime. (Patient taking differently: Inject 14 Units into the skin daily.), Disp: , Rfl:    Lactobacillus-Inulin (CULTURELLE DIGESTIVE DAILY PO), Take 15 Billion Cells by mouth daily., Disp: , Rfl:    Misc Natural Products (OSTEO BI-FLEX ADV JOINT SHIELD PO), Take by mouth 2 (two) times daily. (Patient taking differently: Take by mouth  daily.), Disp: , Rfl:    omeprazole  (PRILOSEC) 40 MG capsule, Take 1 capsule (40 mg total) by mouth daily., Disp: 30 capsule, Rfl: 5   ondansetron  (ZOFRAN ) 4 MG tablet, Take 1 tablet (4 mg total) by mouth every 6 (six) hours as needed for nausea., Disp: 20 tablet, Rfl: 0   oxyCODONE  (OXY IR/ROXICODONE ) 5 MG immediate release tablet, Take 0.5-1 tablets (2.5-5 mg total) by mouth every 4 (four) hours as needed for moderate pain (pain score 4-6)., Disp: 30 tablet, Rfl: 0   OZEMPIC , 1 MG/DOSE, 4 MG/3ML SOPN, Inject 1 mg into the skin once a week., Disp: , Rfl:    pregabalin  (LYRICA ) 150 MG capsule, Take 1 capsule (150 mg total) by mouth 3 (three) times daily. (Patient taking differently: Take 150 mg by mouth 2 (two) times daily.), Disp: 90 capsule, Rfl: 5   psyllium (METAMUCIL) 58.6 % powder, Take 1 packet by mouth daily., Disp: , Rfl:    rosuvastatin  (CRESTOR ) 40 MG tablet, Take 1 tablet (40 mg total) by mouth daily.,  Disp: 30 tablet, Rfl: 5   sertraline  (ZOLOFT ) 100 MG tablet, Take 1.5 tablets (150 mg total) by mouth daily., Disp: 45 tablet, Rfl: 5   traZODone  (DESYREL ) 100 MG tablet, Take 1 tablet (100 mg total) by mouth at bedtime as needed. for sleep (Patient taking differently: Take 150 mg by mouth at bedtime. for sleep), Disp: 30 tablet, Rfl: 5   TRESIBA FLEXTOUCH 200 UNIT/ML FlexTouch Pen, 85 Units daily. In morning, Disp: , Rfl:    VASCEPA  1 g capsule, Take 2 capsules (2 g total) by mouth 2 (two) times daily., Disp: 120 capsule, Rfl: 5   enoxaparin  (LOVENOX ) 40 MG/0.4ML injection, Inject 0.4 mLs (40 mg total) into the skin daily for 14 days., Disp: 5.6 mL, Rfl: 0   metoprolol  tartrate (LOPRESSOR ) 25 MG tablet, Take 0.5 tablets (12.5 mg total) by mouth 2 (two) times daily., Disp: 60 tablet, Rfl: 0  Allergies  Allergen Reactions   Codeine Other (See Comments)    TRIPPED OUT  DIDN'T LIKE THE WAY IT FELT   Atorvastatin     muscle pain   Hydrocodone      itching   Tramadol     Latex Rash   Zolpidem  Other (See Comments)    Sleep walk    ROS  Ten systems reviewed and is negative except as mentioned in HPI    Objective  Vitals:   08/12/24 1533  BP: 136/74  Pulse: 88  Resp: 16  SpO2: 94%  Weight: 234 lb 1.6 oz (106.2 kg)  Height: 5' 2 (1.575 m)    Body mass index is 42.82 kg/m.   Physical Exam CONSTITUTIONAL: Patient appears well-developed and well-nourished. No distress. HEENT: Head atraumatic, normocephalic, neck supple. CARDIOVASCULAR: Normal rate, regular rhythm and normal heart sounds. No murmur heard. No BLE edema. PULMONARY: Effort normal and breath sounds normal. No respiratory distress. ABDOMINAL: There is no tenderness or distention. MUSCULOSKELETAL: Normal gait. Without gross motor or sensory deficit. Left knee extension good, right knee grinding present. Tenderness in lower spine to the right side. PSYCHIATRIC: Patient has a normal mood and affect. Behavior is normal.  Judgment and thought content normal. SKIN: Scalp thinning, no bald spots.  Recent Results (from the past 2160 hours)  Glucose, capillary     Status: Abnormal   Collection Time: 05/19/24  6:23 AM  Result Value Ref Range   Glucose-Capillary 160 (H) 70 - 99 mg/dL    Comment: Glucose reference range applies only  to samples taken after fasting for at least 8 hours.  Glucose, capillary     Status: Abnormal   Collection Time: 05/19/24 10:14 AM  Result Value Ref Range   Glucose-Capillary 175 (H) 70 - 99 mg/dL    Comment: Glucose reference range applies only to samples taken after fasting for at least 8 hours.  Glucose, capillary     Status: Abnormal   Collection Time: 05/19/24  4:33 PM  Result Value Ref Range   Glucose-Capillary 215 (H) 70 - 99 mg/dL    Comment: Glucose reference range applies only to samples taken after fasting for at least 8 hours.  Glucose, capillary     Status: Abnormal   Collection Time: 05/19/24  9:25 PM  Result Value Ref Range   Glucose-Capillary 239 (H) 70 - 99 mg/dL    Comment: Glucose reference range applies only to samples taken after fasting for at least 8 hours.  CBC     Status: Abnormal   Collection Time: 05/20/24  6:19 AM  Result Value Ref Range   WBC 12.3 (H) 4.0 - 10.5 K/uL   RBC 3.70 (L) 3.87 - 5.11 MIL/uL   Hemoglobin 11.1 (L) 12.0 - 15.0 g/dL   HCT 66.3 (L) 63.9 - 53.9 %   MCV 90.8 80.0 - 100.0 fL   MCH 30.0 26.0 - 34.0 pg   MCHC 33.0 30.0 - 36.0 g/dL   RDW 85.9 88.4 - 84.4 %   Platelets 173 150 - 400 K/uL   nRBC 0.0 0.0 - 0.2 %    Comment: Performed at [facility], [address] [place], [place] [address]  Basic metabolic panel     Status: Abnormal   Collection Time: 05/20/24  6:19 AM  Result Value Ref Range   Sodium 140 135 - 145 mmol/L   Potassium 4.6 3.5 - 5.1 mmol/L   Chloride 105 98 - 111 mmol/L   CO2 24 22 - 32 mmol/L   Glucose, Bld 202 (H) 70 - 99 mg/dL    Comment: Glucose reference range applies only to samples taken  after fasting for at least 8 hours.   BUN 37 (H) 8 - 23 mg/dL   Creatinine, Ser 8.57 (H) 0.44 - 1.00 mg/dL   Calcium  8.9 8.9 - 10.3 mg/dL   GFR, Estimated 39 (L) >60 mL/min    Comment: (NOTE) Calculated using the CKD-EPI Creatinine Equation (2021)    Anion gap 11 5 - 15    Comment: Performed at [facility], [address] [place], [place] [address]  Glucose, capillary     Status: Abnormal   Collection Time: 05/20/24  8:07 AM  Result Value Ref Range   Glucose-Capillary 161 (H) 70 - 99 mg/dL    Comment: Glucose reference range applies only to samples taken after fasting for at least 8 hours.  Glucose, capillary     Status: Abnormal   Collection Time: 05/20/24 12:22 PM  Result Value Ref Range   Glucose-Capillary 168 (H) 70 - 99 mg/dL    Comment: Glucose reference range applies only to samples taken after fasting for at least 8 hours.  Urinalysis, Routine w reflex microscopic -Urine, Clean Catch     Status: Abnormal   Collection Time: 05/22/24  3:23 PM  Result Value Ref Range   Color, Urine AMBER (A) YELLOW    Comment: BIOCHEMICALS MAY BE AFFECTED BY COLOR   APPearance TURBID (A) CLEAR   Specific Gravity, Urine 1.017 1.005 - 1.030   pH 5.0  5.0 - 8.0   Glucose, UA NEGATIVE NEGATIVE mg/dL   Hgb urine dipstick NEGATIVE NEGATIVE   Bilirubin Urine NEGATIVE NEGATIVE   Ketones, ur NEGATIVE NEGATIVE mg/dL   Protein, ur >=699 (A) NEGATIVE mg/dL   Nitrite NEGATIVE NEGATIVE   Leukocytes,Ua MODERATE (A) NEGATIVE   RBC / HPF 6-10 0 - 5 RBC/hpf   WBC, UA >50 0 - 5 WBC/hpf   Bacteria, UA MANY (A) NONE SEEN   Squamous Epithelial / HPF 0-5 0 - 5 /HPF   Mucus PRESENT     Comment: Performed at [facility], [address] [place], [place] [address]  Lipase, blood     Status: None   Collection Time: 05/22/24  3:49 PM  Result Value Ref Range   Lipase 22 11 - 51 U/L    Comment: Performed at [facility], [address] [place], [place] [address]  Comprehensive  metabolic panel     Status: Abnormal   Collection Time: 05/22/24  3:49 PM  Result Value Ref Range   Sodium 137 135 - 145 mmol/L   Potassium 4.2 3.5 - 5.1 mmol/L   Chloride 100 98 - 111 mmol/L   CO2 18 (L) 22 - 32 mmol/L   Glucose, Bld 271 (H) 70 - 99 mg/dL    Comment: Glucose reference range applies only to samples taken after fasting for at least 8 hours.   BUN 24 (H) 8 - 23 mg/dL   Creatinine, Ser 8.49 (H) 0.44 - 1.00 mg/dL   Calcium  9.9 8.9 - 10.3 mg/dL   Total Protein 7.6 6.5 - 8.1 g/dL   Albumin 3.8 3.5 - 5.0 g/dL   AST 25 15 - 41 U/L   ALT 12 0 - 44 U/L   Alkaline Phosphatase 63 38 - 126 U/L   Total Bilirubin 1.5 (H) 0.0 - 1.2 mg/dL   GFR, Estimated 36 (L) >60 mL/min    Comment: (NOTE) Calculated using the CKD-EPI Creatinine Equation (2021)    Anion gap 19 (H) 5 - 15    Comment: Performed at [facility], [address] [place], [place] [address]  CBC     Status: Abnormal   Collection Time: 05/22/24  3:49 PM  Result Value Ref Range   WBC 15.7 (H) 4.0 - 10.5 K/uL   RBC 4.22 3.87 - 5.11 MIL/uL   Hemoglobin 12.4 12.0 - 15.0 g/dL   HCT 62.1 63.9 - 53.9 %   MCV 89.6 80.0 - 100.0 fL   MCH 29.4 26.0 - 34.0 pg   MCHC 32.8 30.0 - 36.0 g/dL   RDW 85.9 88.4 - 84.4 %   Platelets 251 150 - 400 K/uL   nRBC 0.0 0.0 - 0.2 %    Comment: Performed at [facility], [address] [place], [place] [address]  Lactic acid, plasma     Status: None   Collection Time: 05/22/24  3:49 PM  Result Value Ref Range   Lactic Acid, Venous 1.6 0.5 - 1.9 mmol/L    Comment: Performed at [facility], [address] [place], [place] [address]  Urine Culture     Status: Abnormal   Collection Time: 05/22/24  8:23 PM   Specimen: Urine, Clean Catch  Result Value Ref Range   Specimen Description      URINE, CLEAN CATCH Performed at [facility], [address] [place], [place] [address]    Special Requests      NONE Performed at [facility],  [address] [place], [place] [address]    Culture MULTIPLE SPECIES PRESENT, SUGGEST RECOLLECTION (A)    Report Status 05/24/2024 FINAL   Blood gas, venous     Status: Abnormal   Collection Time: 05/22/24  8:23 PM  Result Value Ref Range   pH, Ven 7.39 7.25 - 7.43   pCO2, Ven 38 (L) 44 - 60 mmHg   pO2, Ven 40 32 - 45 mmHg   Bicarbonate 23.0 20.0 - 28.0 mmol/L   Acid-base deficit 1.7 0.0 - 2.0 mmol/L   O2 Saturation 71.4 %   Patient temperature 37.0    Collection site VEIN     Comment: Performed at [facility], [address] [place], [place] [address]  CBG monitoring, ED     Status: Abnormal   Collection Time: 05/22/24  9:27 PM  Result Value Ref Range   Glucose-Capillary 267 (H) 70 - 99 mg/dL    Comment: Glucose reference range applies only to samples taken after fasting for at least 8 hours.  Resp panel by RT-PCR (RSV, Flu A&B, Covid) Anterior Nasal Swab     Status: None   Collection Time: 05/22/24  9:28 PM   Specimen: Anterior Nasal Swab  Result Value Ref Range   SARS Coronavirus 2 by RT PCR NEGATIVE NEGATIVE    Comment: (NOTE) SARS-CoV-2 target nucleic acids are NOT DETECTED.  The SARS-CoV-2 RNA is generally detectable in upper respiratory specimens during the acute phase of infection. The lowest concentration of SARS-CoV-2 viral copies this assay can detect is 138 copies/mL. A negative result does not preclude SARS-Cov-2 infection and should not be used as the sole basis for treatment or other patient management decisions. A negative result may occur with  improper specimen collection/handling, submission of specimen other than nasopharyngeal swab, presence of viral mutation(s) within the areas targeted by this assay, and inadequate number of viral copies(<138 copies/mL). A negative result must be combined with clinical observations, patient history, and epidemiological information. The expected result is Negative.  Fact Sheet for Patients:   bloggercourse.com  Fact Sheet for Healthcare Providers:  seriousbroker.it  This test is no t yet approved or cleared by the [place]  FDA and  has been authorized for detection and/or diagnosis of SARS-CoV-2 by FDA under an Emergency Use Authorization (EUA). This EUA will remain  in effect (meaning this test can be used) for the duration of the COVID-19 declaration under Section 564(b)(1) of the Act, 21 U.S.C.section 360bbb-3(b)(1), unless the authorization is terminated  or revoked sooner.       Influenza A by PCR NEGATIVE NEGATIVE   Influenza B by PCR NEGATIVE NEGATIVE    Comment: (NOTE) The Xpert Xpress SARS-CoV-2/FLU/RSV plus assay is intended as an aid in the diagnosis of influenza from Nasopharyngeal swab specimens and should not be used as a sole basis for treatment. Nasal washings and aspirates are unacceptable for Xpert Xpress SARS-CoV-2/FLU/RSV testing.  Fact Sheet for Patients: bloggercourse.com  Fact Sheet for Healthcare Providers: seriousbroker.it  This test is not yet approved or cleared by the [place]  FDA and has been authorized for detection and/or diagnosis of SARS-CoV-2 by FDA under an Emergency Use Authorization (EUA). This EUA will remain in effect (meaning this test can be used) for the duration of the COVID-19 declaration under Section 564(b)(1) of the Act, 21 U.S.C. section 360bbb-3(b)(1), unless the authorization is terminated or revoked.     Resp Syncytial Virus by PCR NEGATIVE NEGATIVE    Comment: (NOTE) Fact Sheet  for Patients: bloggercourse.com  Fact Sheet for Healthcare Providers: seriousbroker.it  This test is not yet approved or cleared by the [place]  FDA and has been authorized for detection and/or diagnosis of SARS-CoV-2 by FDA under an Emergency Use Authorization (EUA).  This EUA will remain in effect (meaning this test can be used) for the duration of the COVID-19 declaration under Section 564(b)(1) of the Act, 21 U.S.C. section 360bbb-3(b)(1), unless the authorization is terminated or revoked.  Performed at [facility], [address] [place], [place] [address]   Blood Culture (routine x 2)     Status: None   Collection Time: 05/22/24  9:28 PM   Specimen: Right Antecubital; Blood  Result Value Ref Range   Specimen Description RIGHT ANTECUBITAL    Special Requests      BOTTLES DRAWN AEROBIC AND ANAEROBIC Blood Culture adequate volume   Culture      NO GROWTH 5 DAYS Performed at [facility], [address] [place], [place] [address]    Report Status 05/27/2024 FINAL   Blood Culture (routine x 2)     Status: None   Collection Time: 05/22/24  9:33 PM   Specimen: BLOOD  Result Value Ref Range   Specimen Description BLOOD BLOOD RIGHT ARM    Special Requests      BOTTLES DRAWN AEROBIC AND ANAEROBIC Blood Culture results may not be optimal due to an inadequate volume of blood received in culture bottles   Culture      NO GROWTH 5 DAYS Performed at [facility], [address] [place], [place] [address]    Report Status 05/27/2024 FINAL   CBG monitoring, ED     Status: Abnormal   Collection Time: 05/22/24 11:28 PM  Result Value Ref Range   Glucose-Capillary 201 (H) 70 - 99 mg/dL    Comment: Glucose reference range applies only to samples taken after fasting for at least 8 hours.  Protime-INR     Status: Abnormal   Collection Time: 05/23/24  7:51 AM  Result Value Ref Range   Prothrombin Time 15.9 (H) 11.4 - 15.2 seconds   INR 1.2 0.8 - 1.2    Comment: (NOTE) INR goal varies based on device and disease states. Performed at [facility], [address] [place], [place] [address]   Cortisol-am, blood     Status: Abnormal   Collection Time: 05/23/24  7:51 AM  Result Value Ref Range   Cortisol - AM  22.8 (H) 6.7 - 22.6 ug/dL    Comment: Performed at [facility], [address], [place], [place] [address]  Basic metabolic panel     Status: Abnormal   Collection Time: 05/23/24  7:51 AM  Result Value Ref Range   Sodium 142 135 - 145 mmol/L   Potassium 3.7 3.5 - 5.1 mmol/L   Chloride 109 98 - 111 mmol/L   CO2 26 22 - 32 mmol/L   Glucose, Bld 100 (H) 70 - 99 mg/dL    Comment: Glucose reference range applies only to samples taken after fasting for at least 8 hours.   BUN 24 (H) 8 - 23 mg/dL   Creatinine, Ser 8.74 (H) 0.44 - 1.00 mg/dL   Calcium  9.2 8.9 - 10.3 mg/dL   GFR, Estimated 45 (L) >60 mL/min    Comment: (NOTE) Calculated using the CKD-EPI Creatinine Equation (2021)    Anion gap 7 5 - 15    Comment: Performed at [facility], [address] [place], [place] [address]  CBC     Status: Abnormal   Collection Time: 05/23/24  7:51 AM  Result Value Ref Range   WBC 9.8 4.0 - 10.5 K/uL   RBC 3.55 (L) 3.87 - 5.11 MIL/uL   Hemoglobin 10.4 (L) 12.0 - 15.0 g/dL   HCT 67.8 (L) 63.9 - 53.9 %   MCV 90.4 80.0 - 100.0 fL   MCH 29.3 26.0 - 34.0 pg   MCHC 32.4 30.0 - 36.0 g/dL   RDW 85.7 88.4 - 84.4 %   Platelets 192 150 - 400 K/uL   nRBC 0.0 0.0 - 0.2 %    Comment: Performed at [facility], [address] [place], [place] [address]  Folate     Status: None   Collection Time: 05/23/24  7:51 AM  Result Value Ref Range   Folate 12.5 >5.9 ng/mL    Comment: Performed at [facility], [address] [place], [place] [address]  Magnesium      Status: None   Collection Time: 05/23/24  7:51 AM  Result Value Ref Range   Magnesium  2.0 1.7 - 2.4 mg/dL    Comment: Performed at [facility], [address], [place], [place] [address]  Phosphorus     Status: Abnormal   Collection Time: 05/23/24  7:51 AM  Result Value Ref Range   Phosphorus 2.0 (L) 2.5 - 4.6 mg/dL    Comment: Performed at [facility], [address] [place],  [place] [address]  Glucose, capillary     Status: Abnormal   Collection Time: 05/23/24 11:40 AM  Result Value Ref Range   Glucose-Capillary 189 (H) 70 - 99 mg/dL    Comment: Glucose reference range applies only to samples taken after fasting for at least 8 hours. PATIENT IDENTIFICATION ERROR. PLEASE DISREGARD RESULTS. ACCOUNT WILL BE CREDITED. Performed at [facility], [address] [place], [place] [address]   Glucose, capillary     Status: None   Collection Time: 05/23/24 12:09 PM  Result Value Ref Range   Glucose-Capillary 96 70 - 99 mg/dL    Comment: Glucose reference range applies only to samples taken after fasting for at least 8 hours.  Glucose, capillary     Status: Abnormal   Collection Time: 05/23/24  5:08 PM  Result Value Ref Range   Glucose-Capillary 203 (H) 70 - 99 mg/dL    Comment: Glucose reference range applies only to samples taken after fasting for at least 8 hours.   Comment 1 Notify RN    Comment 2 Document in Chart   Glucose, capillary     Status: Abnormal   Collection Time: 05/23/24  9:07 PM  Result Value Ref Range   Glucose-Capillary 203 (H) 70 - 99 mg/dL    Comment: Glucose reference range applies only to samples taken after fasting for at least 8 hours.  Basic metabolic panel with GFR     Status: Abnormal   Collection Time: 05/24/24  6:21 AM  Result Value Ref Range   Sodium 140 135 - 145 mmol/L   Potassium 3.5 3.5 - 5.1 mmol/L   Chloride 105 98 - 111 mmol/L   CO2 23 22 - 32 mmol/L   Glucose, Bld 117 (H) 70 - 99 mg/dL    Comment: Glucose reference range applies only to samples taken after fasting for at least 8 hours.   BUN 29 (H) 8 - 23 mg/dL   Creatinine, Ser 8.69 (H) 0.44 - 1.00  mg/dL   Calcium  9.0 8.9 - 10.3 mg/dL   GFR, Estimated 43 (L) >60 mL/min    Comment: (NOTE) Calculated using the CKD-EPI Creatinine Equation (2021)    Anion gap 12 5 - 15    Comment: Performed at [facility], [address] [place], [place] [address]  CBC      Status: Abnormal   Collection Time: 05/24/24  6:21 AM  Result Value Ref Range   WBC 8.4 4.0 - 10.5 K/uL   RBC 3.37 (L) 3.87 - 5.11 MIL/uL   Hemoglobin 10.1 (L) 12.0 - 15.0 g/dL   HCT 70.1 (L) 63.9 - 53.9 %   MCV 88.4 80.0 - 100.0 fL   MCH 30.0 26.0 - 34.0 pg   MCHC 33.9 30.0 - 36.0 g/dL   RDW 85.6 88.4 - 84.4 %   Platelets 189 150 - 400 K/uL   nRBC 0.0 0.0 - 0.2 %    Comment: Performed at [facility], [address], [place], [place] [address]  Magnesium      Status: None   Collection Time: 05/24/24  6:21 AM  Result Value Ref Range   Magnesium  1.9 1.7 - 2.4 mg/dL    Comment: Performed at [facility], [address] [place], [place] [address]  Phosphorus     Status: None   Collection Time: 05/24/24  6:21 AM  Result Value Ref Range   Phosphorus 3.8 2.5 - 4.6 mg/dL    Comment: Performed at [facility], [address] [place], [place] [address]  Glucose, capillary     Status: Abnormal   Collection Time: 05/24/24  7:48 AM  Result Value Ref Range   Glucose-Capillary 115 (H) 70 - 99 mg/dL    Comment: Glucose reference range applies only to samples taken after fasting for at least 8 hours.   Comment 1 Notify RN    Comment 2 Document in Chart   Glucose, capillary     Status: Abnormal   Collection Time: 05/24/24 12:24 PM  Result Value Ref Range   Glucose-Capillary 229 (H) 70 - 99 mg/dL    Comment: Glucose reference range applies only to samples taken after fasting for at least 8 hours.   Comment 1 Notify RN    Comment 2 Document in Chart   Glucose, capillary     Status: Abnormal   Collection Time: 05/24/24  4:48 PM  Result Value Ref Range   Glucose-Capillary 216 (H) 70 - 99 mg/dL    Comment: Glucose reference range applies only to samples taken after fasting for at least 8 hours.   Comment 1 Notify RN    Comment 2 Document in Chart   Glucose, capillary     Status: Abnormal   Collection Time: 05/24/24  9:00 PM  Result Value Ref Range    Glucose-Capillary 206 (H) 70 - 99 mg/dL    Comment: Glucose reference range applies only to samples taken after fasting for at least 8 hours.  Basic metabolic panel with GFR     Status: Abnormal   Collection Time: 05/25/24  5:17 AM  Result Value Ref Range   Sodium 137 135 - 145 mmol/L   Potassium 3.8 3.5 - 5.1 mmol/L   Chloride 101 98 - 111 mmol/L   CO2 25 22 - 32 mmol/L   Glucose, Bld 164 (H) 70 - 99 mg/dL    Comment: Glucose reference range applies only to samples taken after fasting for at least 8 hours.   BUN 35 (  H) 8 - 23 mg/dL   Creatinine, Ser 8.71 (H) 0.44 - 1.00 mg/dL   Calcium  9.3 8.9 - 10.3 mg/dL   GFR, Estimated 44 (L) >60 mL/min    Comment: (NOTE) Calculated using the CKD-EPI Creatinine Equation (2021)    Anion gap 11 5 - 15    Comment: Performed at [facility], [address] [place], [place] [address]  CBC     Status: Abnormal   Collection Time: 05/25/24  5:17 AM  Result Value Ref Range   WBC 8.0 4.0 - 10.5 K/uL   RBC 3.71 (L) 3.87 - 5.11 MIL/uL   Hemoglobin 10.6 (L) 12.0 - 15.0 g/dL   HCT 67.2 (L) 63.9 - 53.9 %   MCV 88.1 80.0 - 100.0 fL   MCH 28.6 26.0 - 34.0 pg   MCHC 32.4 30.0 - 36.0 g/dL   RDW 85.9 88.4 - 84.4 %   Platelets 210 150 - 400 K/uL   nRBC 0.0 0.0 - 0.2 %    Comment: Performed at [facility], [address] [place], [place] [address]  Magnesium      Status: None   Collection Time: 05/25/24  5:17 AM  Result Value Ref Range   Magnesium  2.0 1.7 - 2.4 mg/dL    Comment: Performed at [facility], [address], [place], [place] [address]  Phosphorus     Status: None   Collection Time: 05/25/24  5:17 AM  Result Value Ref Range   Phosphorus 3.8 2.5 - 4.6 mg/dL    Comment: Performed at [facility], [address] [place], [place] [address]  Glucose, capillary     Status: Abnormal   Collection Time: 05/25/24  8:02 AM  Result Value Ref Range   Glucose-Capillary 156 (H) 70 - 99 mg/dL    Comment: Glucose  reference range applies only to samples taken after fasting for at least 8 hours.   Comment 1 Notify RN    Comment 2 Document in Chart   Glucose, capillary     Status: Abnormal   Collection Time: 05/25/24 11:47 AM  Result Value Ref Range   Glucose-Capillary 202 (H) 70 - 99 mg/dL    Comment: Glucose reference range applies only to samples taken after fasting for at least 8 hours.   Comment 1 Notify RN    Comment 2 Document in Chart   Glucose, capillary     Status: Abnormal   Collection Time: 05/25/24  5:05 PM  Result Value Ref Range   Glucose-Capillary 159 (H) 70 - 99 mg/dL    Comment: Glucose reference range applies only to samples taken after fasting for at least 8 hours.   Comment 1 Notify RN    Comment 2 Document in Chart   Glucose, capillary     Status: Abnormal   Collection Time: 05/25/24  9:10 PM  Result Value Ref Range   Glucose-Capillary 254 (H) 70 - 99 mg/dL    Comment: Glucose reference range applies only to samples taken after fasting for at least 8 hours.  Basic metabolic panel with GFR     Status: Abnormal   Collection Time: 05/26/24  4:42 AM  Result Value Ref Range   Sodium 139 135 - 145 mmol/L   Potassium 4.3 3.5 - 5.1 mmol/L   Chloride 103 98 - 111 mmol/L   CO2 27 22 - 32 mmol/L   Glucose, Bld 170 (H) 70 - 99 mg/dL    Comment: Glucose reference range applies only to  samples taken after fasting for at least 8 hours.   BUN 39 (H) 8 - 23 mg/dL   Creatinine, Ser 8.45 (H) 0.44 - 1.00 mg/dL   Calcium  9.4 8.9 - 10.3 mg/dL   GFR, Estimated 35 (L) >60 mL/min    Comment: (NOTE) Calculated using the CKD-EPI Creatinine Equation (2021)    Anion gap 9 5 - 15    Comment: Performed at [facility], [address] [place], [place] [address]  CBC     Status: Abnormal   Collection Time: 05/26/24  4:42 AM  Result Value Ref Range   WBC 9.3 4.0 - 10.5 K/uL   RBC 3.62 (L) 3.87 - 5.11 MIL/uL   Hemoglobin 10.6 (L) 12.0 - 15.0 g/dL   HCT 67.2 (L) 63.9 - 53.9 %   MCV  90.3 80.0 - 100.0 fL   MCH 29.3 26.0 - 34.0 pg   MCHC 32.4 30.0 - 36.0 g/dL   RDW 85.8 88.4 - 84.4 %   Platelets 231 150 - 400 K/uL   nRBC 0.0 0.0 - 0.2 %    Comment: Performed at [facility], [address], [place], [place] [address]  Magnesium      Status: Abnormal   Collection Time: 05/26/24  4:42 AM  Result Value Ref Range   Magnesium  2.5 (H) 1.7 - 2.4 mg/dL    Comment: Performed at [facility], [address] [place], [place] [address]  Phosphorus     Status: None   Collection Time: 05/26/24  4:42 AM  Result Value Ref Range   Phosphorus 3.8 2.5 - 4.6 mg/dL    Comment: Performed at [facility], [address] [place], [place] [address]  Glucose, capillary     Status: Abnormal   Collection Time: 05/26/24  8:34 AM  Result Value Ref Range   Glucose-Capillary 186 (H) 70 - 99 mg/dL    Comment: Glucose reference range applies only to samples taken after fasting for at least 8 hours.   Comment 1 Notify RN    Comment 2 Document in Chart   Glucose, capillary     Status: Abnormal   Collection Time: 05/26/24 12:16 PM  Result Value Ref Range   Glucose-Capillary 246 (H) 70 - 99 mg/dL    Comment: Glucose reference range applies only to samples taken after fasting for at least 8 hours.  Glucose, capillary     Status: Abnormal   Collection Time: 05/26/24  4:32 PM  Result Value Ref Range   Glucose-Capillary 202 (H) 70 - 99 mg/dL    Comment: Glucose reference range applies only to samples taken after fasting for at least 8 hours.  Glucose, capillary     Status: Abnormal   Collection Time: 05/26/24  9:06 PM  Result Value Ref Range   Glucose-Capillary 198 (H) 70 - 99 mg/dL    Comment: Glucose reference range applies only to samples taken after fasting for at least 8 hours.  Basic metabolic panel with GFR     Status: Abnormal   Collection Time: 05/27/24  4:16 AM  Result Value Ref Range   Sodium 137 135 - 145 mmol/L   Potassium 4.3 3.5 - 5.1 mmol/L    Chloride 103 98 - 111 mmol/L   CO2 24 22 - 32 mmol/L   Glucose, Bld 192 (H) 70 - 99 mg/dL    Comment: Glucose reference range applies only to samples taken after fasting for at least 8 hours.   BUN 41 (H) 8 -  23 mg/dL   Creatinine, Ser 8.55 (H) 0.44 - 1.00 mg/dL   Calcium  9.3 8.9 - 10.3 mg/dL   GFR, Estimated 38 (L) >60 mL/min    Comment: (NOTE) Calculated using the CKD-EPI Creatinine Equation (2021)    Anion gap 10 5 - 15    Comment: Performed at [facility], [address] [place], [place] [address]  CBC     Status: Abnormal   Collection Time: 05/27/24  4:16 AM  Result Value Ref Range   WBC 11.6 (H) 4.0 - 10.5 K/uL   RBC 3.62 (L) 3.87 - 5.11 MIL/uL   Hemoglobin 10.7 (L) 12.0 - 15.0 g/dL   HCT 67.8 (L) 63.9 - 53.9 %   MCV 88.7 80.0 - 100.0 fL   MCH 29.6 26.0 - 34.0 pg   MCHC 33.3 30.0 - 36.0 g/dL   RDW 85.9 88.4 - 84.4 %   Platelets 256 150 - 400 K/uL   nRBC 0.0 0.0 - 0.2 %    Comment: Performed at [facility], [address] [place], [place] [address]  Glucose, capillary     Status: Abnormal   Collection Time: 05/27/24  8:01 AM  Result Value Ref Range   Glucose-Capillary 184 (H) 70 - 99 mg/dL    Comment: Glucose reference range applies only to samples taken after fasting for at least 8 hours.   Comment 1 Notify RN    Comment 2 Document in Chart   Glucose, capillary     Status: Abnormal   Collection Time: 05/27/24 11:25 AM  Result Value Ref Range   Glucose-Capillary 252 (H) 70 - 99 mg/dL    Comment: Glucose reference range applies only to samples taken after fasting for at least 8 hours.  Glucose, capillary     Status: Abnormal   Collection Time: 05/27/24  5:03 PM  Result Value Ref Range   Glucose-Capillary 221 (H) 70 - 99 mg/dL    Comment: Glucose reference range applies only to samples taken after fasting for at least 8 hours.  Glucose, capillary     Status: Abnormal   Collection Time: 05/27/24  9:13 PM  Result Value Ref Range    Glucose-Capillary 231 (H) 70 - 99 mg/dL    Comment: Glucose reference range applies only to samples taken after fasting for at least 8 hours.  Basic metabolic panel with GFR     Status: Abnormal   Collection Time: 05/28/24  4:39 AM  Result Value Ref Range   Sodium 138 135 - 145 mmol/L   Potassium 4.5 3.5 - 5.1 mmol/L   Chloride 98 98 - 111 mmol/L   CO2 26 22 - 32 mmol/L   Glucose, Bld 227 (H) 70 - 99 mg/dL    Comment: Glucose reference range applies only to samples taken after fasting for at least 8 hours.   BUN 41 (H) 8 - 23 mg/dL   Creatinine, Ser 8.35 (H) 0.44 - 1.00 mg/dL   Calcium  10.1 8.9 - 10.3 mg/dL   GFR, Estimated 32 (L) >60 mL/min    Comment: (NOTE) Calculated using the CKD-EPI Creatinine Equation (2021)    Anion gap 14 5 - 15    Comment: Performed at [facility], [address] [place], [place] [address]  CBC     Status: Abnormal   Collection Time: 05/28/24  4:39 AM  Result Value Ref Range   WBC 19.6 (H) 4.0 - 10.5 K/uL   RBC 4.38 3.87 - 5.11 MIL/uL   Hemoglobin  13.0 12.0 - 15.0 g/dL   HCT 61.1 63.9 - 53.9 %   MCV 88.6 80.0 - 100.0 fL   MCH 29.7 26.0 - 34.0 pg   MCHC 33.5 30.0 - 36.0 g/dL   RDW 85.8 88.4 - 84.4 %   Platelets 362 150 - 400 K/uL   nRBC 0.0 0.0 - 0.2 %    Comment: Performed at [facility], [address] [place], [place] [address]  Glucose, capillary     Status: Abnormal   Collection Time: 05/28/24  8:47 AM  Result Value Ref Range   Glucose-Capillary 229 (H) 70 - 99 mg/dL    Comment: Glucose reference range applies only to samples taken after fasting for at least 8 hours.   Comment 1 Notify RN    Comment 2 Document in Chart   Basic metabolic panel with GFR     Status: Abnormal   Collection Time: 05/28/24 10:10 AM  Result Value Ref Range   Sodium 136 135 - 145 mmol/L   Potassium 4.7 3.5 - 5.1 mmol/L   Chloride 99 98 - 111 mmol/L   CO2 24 22 - 32 mmol/L   Glucose, Bld 243 (H) 70 - 99 mg/dL    Comment: Glucose reference  range applies only to samples taken after fasting for at least 8 hours.   BUN 40 (H) 8 - 23 mg/dL   Creatinine, Ser 8.48 (H) 0.44 - 1.00 mg/dL   Calcium  9.7 8.9 - 10.3 mg/dL   GFR, Estimated 36 (L) >60 mL/min    Comment: (NOTE) Calculated using the CKD-EPI Creatinine Equation (2021)    Anion gap 13 5 - 15    Comment: Performed at [facility], [address] [place], [place] [address]  CBC     Status: Abnormal   Collection Time: 05/28/24 10:10 AM  Result Value Ref Range   WBC 22.6 (H) 4.0 - 10.5 K/uL   RBC 4.19 3.87 - 5.11 MIL/uL   Hemoglobin 12.2 12.0 - 15.0 g/dL   HCT 63.2 63.9 - 53.9 %   MCV 87.6 80.0 - 100.0 fL   MCH 29.1 26.0 - 34.0 pg   MCHC 33.2 30.0 - 36.0 g/dL   RDW 85.7 88.4 - 84.4 %   Platelets 351 150 - 400 K/uL   nRBC 0.0 0.0 - 0.2 %    Comment: Performed at [facility], [address] [place], [place] [address]  Procalcitonin     Status: None   Collection Time: 05/28/24 10:10 AM  Result Value Ref Range   Procalcitonin 0.11 ng/mL    Comment:        Interpretation: PCT (Procalcitonin) <= 0.5 ng/mL: Systemic infection (sepsis) is not likely. Local bacterial infection is possible. (NOTE)       Sepsis PCT Algorithm           Lower Respiratory Tract                                      Infection PCT Algorithm    ----------------------------     ----------------------------         PCT < 0.25 ng/mL                PCT < 0.10 ng/mL          Strongly encourage             Strongly discourage  discontinuation of antibiotics    initiation of antibiotics    ----------------------------     -----------------------------       PCT 0.25 - 0.50 ng/mL            PCT 0.10 - 0.25 ng/mL               OR       >80% decrease in PCT            Discourage initiation of                                            antibiotics      Encourage discontinuation           of antibiotics    ----------------------------     -----------------------------         PCT  >= 0.50 ng/mL              PCT 0.26 - 0.50 ng/mL               AND        <80% decrease in PCT             Encourage initiation of                                             antibiotics       Encourage continuation           of antibiotics    ----------------------------     -----------------------------        PCT >= 0.50 ng/mL                  PCT > 0.50 ng/mL               AND         increase in PCT                  Strongly encourage                                      initiation of antibiotics    Strongly encourage escalation           of antibiotics                                     -----------------------------                                           PCT <= 0.25 ng/mL                                                 OR                                        >  80% decrease in PCT                                      Discontinue / Do not initiate                                             antibiotics  Performed at [facility], [address] [place], [place] [address]   Glucose, capillary     Status: Abnormal   Collection Time: 05/28/24 11:14 AM  Result Value Ref Range   Glucose-Capillary 221 (H) 70 - 99 mg/dL    Comment: Glucose reference range applies only to samples taken after fasting for at least 8 hours.   Comment 1 Notify RN    Comment 2 Document in Chart   Glucose, capillary     Status: Abnormal   Collection Time: 05/28/24  4:52 PM  Result Value Ref Range   Glucose-Capillary 186 (H) 70 - 99 mg/dL    Comment: Glucose reference range applies only to samples taken after fasting for at least 8 hours.   Comment 1 Notify RN    Comment 2 Document in Chart   Glucose, capillary     Status: Abnormal   Collection Time: 05/28/24  8:21 PM  Result Value Ref Range   Glucose-Capillary 219 (H) 70 - 99 mg/dL    Comment: Glucose reference range applies only to samples taken after fasting for at least 8 hours.  Glucose, capillary     Status: Abnormal    Collection Time: 05/29/24  8:19 AM  Result Value Ref Range   Glucose-Capillary 222 (H) 70 - 99 mg/dL    Comment: Glucose reference range applies only to samples taken after fasting for at least 8 hours.   Comment 1 Notify RN    Comment 2 Document in Chart   Glucose, capillary     Status: Abnormal   Collection Time: 05/29/24 12:12 PM  Result Value Ref Range   Glucose-Capillary 242 (H) 70 - 99 mg/dL    Comment: Glucose reference range applies only to samples taken after fasting for at least 8 hours.   Comment 1 Notify RN    Comment 2 Document in Chart   Glucose, capillary     Status: Abnormal   Collection Time: 05/29/24  5:33 PM  Result Value Ref Range   Glucose-Capillary 256 (H) 70 - 99 mg/dL    Comment: Glucose reference range applies only to samples taken after fasting for at least 8 hours.  Glucose, capillary     Status: Abnormal   Collection Time: 05/29/24  9:18 PM  Result Value Ref Range   Glucose-Capillary 249 (H) 70 - 99 mg/dL    Comment: Glucose reference range applies only to samples taken after fasting for at least 8 hours.  CBC     Status: Abnormal   Collection Time: 05/30/24  4:57 AM  Result Value Ref Range   WBC 14.2 (H) 4.0 - 10.5 K/uL   RBC 3.95 3.87 - 5.11 MIL/uL   Hemoglobin 11.6 (L) 12.0 - 15.0 g/dL   HCT 65.7 (L) 63.9 - 53.9 %   MCV 86.6 80.0 - 100.0 fL   MCH 29.4 26.0 - 34.0 pg   MCHC 33.9 30.0 - 36.0 g/dL   RDW 85.8 88.4 -  15.5 %   Platelets 311 150 - 400 K/uL   nRBC 0.0 0.0 - 0.2 %    Comment: Performed at [facility], [address] [place], [place] [address]  Basic metabolic panel with GFR     Status: Abnormal   Collection Time: 05/30/24  4:57 AM  Result Value Ref Range   Sodium 134 (L) 135 - 145 mmol/L   Potassium 3.8 3.5 - 5.1 mmol/L   Chloride 97 (L) 98 - 111 mmol/L   CO2 25 22 - 32 mmol/L   Glucose, Bld 184 (H) 70 - 99 mg/dL    Comment: Glucose reference range applies only to samples taken after fasting for at least 8 hours.   BUN  48 (H) 8 - 23 mg/dL   Creatinine, Ser 8.19 (H) 0.44 - 1.00 mg/dL   Calcium  9.4 8.9 - 10.3 mg/dL   GFR, Estimated 29 (L) >60 mL/min    Comment: (NOTE) Calculated using the CKD-EPI Creatinine Equation (2021)    Anion gap 12 5 - 15    Comment: Performed at [facility], [address] [place], [place] [address]  Glucose, capillary     Status: Abnormal   Collection Time: 05/30/24  8:01 AM  Result Value Ref Range   Glucose-Capillary 202 (H) 70 - 99 mg/dL    Comment: Glucose reference range applies only to samples taken after fasting for at least 8 hours.  Glucose, capillary     Status: Abnormal   Collection Time: 05/30/24 11:38 AM  Result Value Ref Range   Glucose-Capillary 264 (H) 70 - 99 mg/dL    Comment: Glucose reference range applies only to samples taken after fasting for at least 8 hours.  Glucose, capillary     Status: Abnormal   Collection Time: 05/30/24  4:46 PM  Result Value Ref Range   Glucose-Capillary 212 (H) 70 - 99 mg/dL    Comment: Glucose reference range applies only to samples taken after fasting for at least 8 hours.  Glucose, capillary     Status: Abnormal   Collection Time: 05/30/24  8:49 PM  Result Value Ref Range   Glucose-Capillary 251 (H) 70 - 99 mg/dL    Comment: Glucose reference range applies only to samples taken after fasting for at least 8 hours.  CBC     Status: Abnormal   Collection Time: 05/31/24  6:44 AM  Result Value Ref Range   WBC 16.0 (H) 4.0 - 10.5 K/uL   RBC 4.33 3.87 - 5.11 MIL/uL   Hemoglobin 12.5 12.0 - 15.0 g/dL   HCT 62.0 63.9 - 53.9 %   MCV 87.5 80.0 - 100.0 fL   MCH 28.9 26.0 - 34.0 pg   MCHC 33.0 30.0 - 36.0 g/dL   RDW 85.9 88.4 - 84.4 %   Platelets 427 (H) 150 - 400 K/uL   nRBC 0.0 0.0 - 0.2 %    Comment: Performed at [facility], [address] [place], [place] [address]  Magnesium      Status: Abnormal   Collection Time: 05/31/24  6:44 AM  Result Value Ref Range   Magnesium  2.6 (H) 1.7 - 2.4 mg/dL     Comment: Performed at [facility], [address] [place], [place] [address]  Basic metabolic panel with GFR     Status: Abnormal   Collection Time: 05/31/24  6:44 AM  Result Value Ref Range   Sodium 136 135 - 145 mmol/L   Potassium 3.7 3.5 - 5.1  mmol/L   Chloride 97 (L) 98 - 111 mmol/L   CO2 26 22 - 32 mmol/L   Glucose, Bld 186 (H) 70 - 99 mg/dL    Comment: Glucose reference range applies only to samples taken after fasting for at least 8 hours.   BUN 48 (H) 8 - 23 mg/dL   Creatinine, Ser 8.17 (H) 0.44 - 1.00 mg/dL   Calcium  9.5 8.9 - 10.3 mg/dL   GFR, Estimated 29 (L) >60 mL/min    Comment: (NOTE) Calculated using the CKD-EPI Creatinine Equation (2021)    Anion gap 13 5 - 15    Comment: Performed at [facility], [address] [place], [place] [address]  Phosphorus     Status: None   Collection Time: 05/31/24  6:44 AM  Result Value Ref Range   Phosphorus 4.0 2.5 - 4.6 mg/dL    Comment: Performed at [facility], [address] [place], [place] [address]  Glucose, capillary     Status: Abnormal   Collection Time: 05/31/24  8:20 AM  Result Value Ref Range   Glucose-Capillary 230 (H) 70 - 99 mg/dL    Comment: Glucose reference range applies only to samples taken after fasting for at least 8 hours.   Comment 1 Notify RN    Comment 2 Document in Chart   Glucose, capillary     Status: Abnormal   Collection Time: 05/31/24 11:06 AM  Result Value Ref Range   Glucose-Capillary 310 (H) 70 - 99 mg/dL    Comment: Glucose reference range applies only to samples taken after fasting for at least 8 hours.   Comment 1 Notify RN    Comment 2 Document in Chart   CBC with Differential/Platelet     Status: Abnormal   Collection Time: 06/02/24 10:54 AM  Result Value Ref Range   WBC 16.4 (H) 3.8 - 10.8 Thousand/uL   RBC 4.30 3.80 - 5.10 Million/uL   Hemoglobin 12.4 11.7 - 15.5 g/dL   HCT 61.0 64.9 - 54.9 %   MCV 90.5 80.0 - 100.0 fL   MCH 28.8 27.0 - 33.0 pg    MCHC 31.9 (L) 32.0 - 36.0 g/dL    Comment: For adults, a slight decrease in the calculated MCHC value (in the range of 30 to 32 g/dL) is most likely not clinically significant; however, it should be interpreted with caution in correlation with other red cell parameters and the patient's clinical condition.    RDW 13.9 11.0 - 15.0 %   Platelets 432 (H) 140 - 400 Thousand/uL   MPV 10.8 7.5 - 12.5 fL   Neutro Abs 12,021 (H) 1,500 - 7,800 cells/uL   Absolute Lymphocytes 2,345 850 - 3,900 cells/uL   Absolute Monocytes 1,427 (H) 200 - 950 cells/uL   Eosinophils Absolute 443 15 - 500 cells/uL   Basophils Absolute 164 0 - 200 cells/uL   Neutrophils Relative % 73.3 %   Total Lymphocyte 14.3 %   Monocytes Relative 8.7 %   Eosinophils Relative 2.7 %   Basophils Relative 1.0 %   Smear Review      Comment: Absolute neutrophilia with mild left shift in maturation. Favor reactive process. Clinical correlation is recommended. RBC are unremarkable. The overall findings in this smear are consistent with a reactive thrombocytosis. Clinical correlation is recommended. Reviewed by Romero DOROTHA Rav, MD, PhD, FCAP (Electronic Signature on File)   06/03/2024 Final report pending pathologist review   Comprehensive metabolic panel with GFR  Status: Abnormal   Collection Time: 06/02/24 10:54 AM  Result Value Ref Range   Glucose, Bld 264 (H) 65 - 99 mg/dL    Comment: .            Fasting reference interval . For someone without known diabetes, a glucose value >125 mg/dL indicates that they may have diabetes and this should be confirmed with a follow-up test. .    BUN 38 (H) 7 - 25 mg/dL   Creat 8.31 (H) 9.39 - 1.00 mg/dL   eGFR 32 (L) > OR = 60 mL/min/1.77m2   BUN/Creatinine Ratio 23 (H) 6 - 22 (calc)   Sodium 137 135 - 146 mmol/L   Potassium 4.3 3.5 - 5.3 mmol/L   Chloride 99 98 - 110 mmol/L   CO2 26 20 - 32 mmol/L   Calcium  9.8 8.6 - 10.4 mg/dL   Total Protein 6.6 6.1 - 8.1 g/dL    Albumin 3.9 3.6 - 5.1 g/dL   Globulin 2.7 1.9 - 3.7 g/dL (calc)   AG Ratio 1.4 1.0 - 2.5 (calc)   Total Bilirubin 0.4 0.2 - 1.2 mg/dL   Alkaline phosphatase (APISO) 90 37 - 153 U/L   AST 14 10 - 35 U/L   ALT 13 6 - 29 U/L      Assessment & Plan Bilateral knee pain due to osteoarthritis, status post left total knee replacement Chronic bilateral knee pain due to osteoarthritis. Left knee replaced; persistent anterior pain. Right knee replacement pending insurance change. Pain management complicated by diabetes and chronic kidney disease. - Prescribed prednisone  10 tablets, take twice a day for 5 days. - Advised hydration and avoidance of sodas, sweet tea, and starches to prevent hyperglycemia. - Recommended Voltaren  gel for pain relief. - Suggested over-the-counter Lidoderm  patch for additional pain management.  Right-sided sciatica with low back pain Right-sided sciatica with low back pain, likely lumbar origin. Pain radiates down leg, worsens with movement. Chronic pain management limited by diabetes and chronic kidney disease. - Prescribed prednisone  for sciatica pain. - Advised use of Lidoderm  patch for localized pain relief.  Type 2 diabetes mellitus with hyperglycemia Type 2 diabetes with hyperglycemia, A1c 7.5%. Blood glucose averages 8.5%. Hyperglycemia complicates pain management and surgical planning. Prednisone  may worsen hyperglycemia. - Advised strict dietary modifications to control blood glucose, reducing starches and avoiding sugary drinks. - Instructed to monitor blood glucose closely, especially during prednisone  treatment.  Chronic kidney disease, stage 3b Chronic kidney disease stage 3b. Recent decline in kidney function noted. Monitoring necessary, especially with diabetes and potential surgery. - Recheck kidney function at next visit in January.  Diffuse hair loss, likely telogen effluvium Diffuse hair loss likely telogen effluvium, possibly post-surgical. No  bald spots, just thinning. Thyroid  function normal in February. - Order thyroid  function tests at next visit to rule out dysfunction. - Monitor hair loss and reassess if no improvement.

## 2024-08-13 ENCOUNTER — Telehealth: Payer: Self-pay | Admitting: Pharmacy Technician

## 2024-08-13 ENCOUNTER — Other Ambulatory Visit (HOSPITAL_COMMUNITY): Payer: Self-pay

## 2024-08-13 NOTE — Telephone Encounter (Signed)
 Pharmacy Patient Advocate Encounter   Received notification from CoverMyMeds that prior authorization for Lidocaine  5% patches is required/requested.   Insurance verification completed.   The patient is insured through [facility].   Per test claim: PA required; PA submitted to above mentioned insurance via Latent Key/confirmation #/EOC [facility] Status is pending

## 2024-08-13 NOTE — Telephone Encounter (Signed)
 Pharmacy Patient Advocate Encounter  Received notification from [facility] MEDICARE that Prior Authorization for Lidocaine  5% patches has been APPROVED from 08/13/24 to 08/13/25. Ran test claim, Copay is $0.00. This test claim was processed through [facility]- copay amounts may vary at other pharmacies due to pharmacy/plan contracts, or as the patient moves through the different stages of their insurance plan.   PA #/Case ID/Reference #: [id number]

## 2024-08-20 NOTE — Telephone Encounter (Signed)
 ERROR

## 2024-08-21 ENCOUNTER — Telehealth: Payer: Self-pay

## 2024-08-21 NOTE — Telephone Encounter (Signed)
 Tried to contact pharmacy unable to get a hold of representative, waited too long

## 2024-08-21 NOTE — Telephone Encounter (Signed)
 Copied from CRM #[id number]. Topic: Clinical - Prescription Issue >> Aug 21, 2024  1:40 PM Wess RAMAN wrote: Reason for CRM: Patient stated pharmacy has not received authorization for medications. She is unsure which medications they are referring to.  Callback #: [id number]  Pharmacy: [email or web address] - [place], [place] - [address] [place] [address] Phone: [phone] Fax: [phone] Hours: Open 24 hours

## 2024-09-01 ENCOUNTER — Other Ambulatory Visit: Payer: Self-pay | Admitting: Family Medicine

## 2024-09-01 DIAGNOSIS — E1169 Type 2 diabetes mellitus with other specified complication: Secondary | ICD-10-CM

## 2024-09-01 NOTE — Telephone Encounter (Signed)
 Copied from CRM #[id number]. Topic: Clinical - Medication Refill >> Sep 01, 2024  4:25 PM Yolanda T wrote: Medication: rosuvastatin  (CRESTOR ) 40 MG tablet  Has the patient contacted their pharmacy? Yes  This is the patient's preferred pharmacy:  [facility] [address] [place] - [address] [place] [address] Phone: [phone] Fax: [phone]  Is this the correct pharmacy for this prescription? Yes  Has the prescription been filled recently? Yes  Is the patient out of the medication? No  Has the patient been seen for an appointment in the last year OR does the patient have an upcoming appointment? Yes  Can we respond through MyChart? unknown  Agent: Please be advised that Rx refills may take up to 3 business days. We ask that you follow-up with your pharmacy.

## 2024-09-03 ENCOUNTER — Other Ambulatory Visit: Payer: Self-pay | Admitting: Family Medicine

## 2024-09-03 DIAGNOSIS — E1169 Type 2 diabetes mellitus with other specified complication: Secondary | ICD-10-CM

## 2024-09-03 DIAGNOSIS — E11610 Type 2 diabetes mellitus with diabetic neuropathic arthropathy: Secondary | ICD-10-CM

## 2024-09-03 DIAGNOSIS — F331 Major depressive disorder, recurrent, moderate: Secondary | ICD-10-CM

## 2024-09-03 MED ORDER — ROSUVASTATIN CALCIUM 40 MG PO TABS: 40.0000 mg | ORAL_TABLET | Freq: Every day | ORAL | 5 refills | Status: AC

## 2024-09-03 NOTE — Telephone Encounter (Signed)
 Requested Prescriptions  Pending Prescriptions Disp Refills   rosuvastatin  (CRESTOR ) 40 MG tablet 30 tablet 5    Sig: Take 1 tablet (40 mg total) by mouth daily.     Cardiovascular:  Antilipid - Statins 2 Failed - 09/03/2024  1:55 PM      Failed - Cr in normal range and within 360 days    Creat  Date Value Ref Range Status  06/02/2024 1.68 (H) 0.60 - 1.00 mg/dL Final   Creatinine, Urine  Date Value Ref Range Status  03/27/2024 60  Final         Failed - Lipid Panel in normal range within the last 12 months    Cholesterol, Total  Date Value Ref Range Status  05/13/2020 209 (H) 100 - 199 mg/dL Final   Cholesterol  Date Value Ref Range Status  06/06/2022 141 0 - 200 Final   LDL Cholesterol (Calc)  Date Value Ref Range Status  10/03/2021 64 mg/dL (calc) Final    Comment:    Reference range: <100 . Desirable range <100 mg/dL for primary prevention;   <70 mg/dL for patients with CHD or diabetic patients  with > or = 2 CHD risk factors. SABRA LDL-C is now calculated using the Martin-Hopkins  calculation, which is a validated novel method providing  better accuracy than the Friedewald equation in the  estimation of LDL-C.  Gladis APPLETHWAITE et al. SANDREA. 7986;689(80): 2061-2068  (http://education.[facility].com/faq/FAQ164)    LDL Cholesterol  Date Value Ref Range Status  06/06/2022 43  Final   HDL  Date Value Ref Range Status  06/06/2022 39 35 - 70 Final  05/13/2020 38 (L) >39 mg/dL Final   Triglycerides  Date Value Ref Range Status  06/06/2022 295 (A) 40 - 160 Final         Passed - Patient is not pregnant      Passed - Valid encounter within last 12 months    Recent Outpatient Visits           3 weeks ago Bilateral chronic knee pain   [place] [facility] Glenard Mire, MD   3 months ago Hospital discharge follow-up   [facility] Glenard Mire, MD   5 months ago MDD (major depressive disorder), recurrent episode,  moderate [facility])    [facility] Sowles, Krichna, MD   10 months ago Pre-op evaluation   [facility] Sowles, Krichna, MD

## 2024-09-05 NOTE — Telephone Encounter (Signed)
 Requested Prescriptions  Pending Prescriptions Disp Refills   rosuvastatin  (CRESTOR ) 40 MG tablet [Pharmacy Med Name: Rosuvastatin  Calcium  40mg  Tablet] 30 tablet 2    Sig: Take 1 tablet by mouth daily.     Cardiovascular:  Antilipid - Statins 2 Failed - 09/05/2024 11:36 AM      Failed - Cr in normal range and within 360 days    Creat  Date Value Ref Range Status  06/02/2024 1.68 (H) 0.60 - 1.00 mg/dL Final   Creatinine, Urine  Date Value Ref Range Status  03/27/2024 60  Final         Failed - Lipid Panel in normal range within the last 12 months    Cholesterol, Total  Date Value Ref Range Status  05/13/2020 209 (H) 100 - 199 mg/dL Final   Cholesterol  Date Value Ref Range Status  06/06/2022 141 0 - 200 Final   LDL Cholesterol (Calc)  Date Value Ref Range Status  10/03/2021 64 mg/dL (calc) Final    Comment:    Reference range: <100 . Desirable range <100 mg/dL for primary prevention;   <70 mg/dL for patients with CHD or diabetic patients  with > or = 2 CHD risk factors. SABRA LDL-C is now calculated using the Martin-Hopkins  calculation, which is a validated novel method providing  better accuracy than the Friedewald equation in the  estimation of LDL-C.  Gladis APPLETHWAITE et al. SANDREA. 7986;689(80): 2061-2068  (http://education.[facility].com/faq/FAQ164)    LDL Cholesterol  Date Value Ref Range Status  06/06/2022 43  Final   HDL  Date Value Ref Range Status  06/06/2022 39 35 - 70 Final  05/13/2020 38 (L) >39 mg/dL Final   Triglycerides  Date Value Ref Range Status  06/06/2022 295 (A) 40 - 160 Final         Passed - Patient is not pregnant      Passed - Valid encounter within last 12 months    Recent Outpatient Visits           3 weeks ago Bilateral chronic knee pain   [place] [facility] Glenard Mire, MD   3 months ago Hospital discharge follow-up   [facility] [place], Mire, MD   5 months ago MDD (major  depressive disorder), recurrent episode, moderate [facility])   [place] [facility] Glenard Mire, MD   10 months ago Pre-op evaluation   [place] [facility] [place], Krichna, MD               buPROPion  (WELLBUTRIN  XL) 150 MG 24 hr tablet [Pharmacy Med Name: Bupropion  Hydrochloride 150mg  Extended-Release (XL) Tablet] 30 tablet 2    Sig: Take 1 tablet by mouth daily.     Psychiatry: Antidepressants - bupropion  Failed - 09/05/2024 11:36 AM      Failed - Cr in normal range and within 360 days    Creat  Date Value Ref Range Status  06/02/2024 1.68 (H) 0.60 - 1.00 mg/dL Final   Creatinine, Urine  Date Value Ref Range Status  03/27/2024 60  Final         Passed - AST in normal range and within 360 days    AST  Date Value Ref Range Status  06/02/2024 14 10 - 35 U/L Final         Passed - ALT in normal range and within 360 days    ALT  Date Value Ref Range Status  06/02/2024 13 6 - 29  U/L Final         Passed - Completed PHQ-2 or PHQ-9 in the last 360 days      Passed - Last BP in normal range    BP Readings from Last 1 Encounters:  08/12/24 136/74         Passed - Valid encounter within last 6 months    Recent Outpatient Visits           3 weeks ago Bilateral chronic knee pain   [place] [facility] Glenard Mire, MD   3 months ago Hospital discharge follow-up   [facility] Glenard Mire, MD   5 months ago MDD (major depressive disorder), recurrent episode, moderate [facility])   [place] [name] Glenard Mire, MD   10 months ago Pre-op evaluation   [facility] [place], Krichna, MD               pregabalin  (LYRICA ) 150 MG capsule [Pharmacy Med Name: Pregabalin  150mg  Capsule] 90 capsule 3    Sig: Take 1 capsule by mouth three times daily.     Not Delegated - Neurology:  Anticonvulsants - Controlled - pregabalin  Failed - 09/05/2024  11:36 AM      Failed - This refill cannot be delegated      Failed - Cr in normal range and within 360 days    Creat  Date Value Ref Range Status  06/02/2024 1.68 (H) 0.60 - 1.00 mg/dL Final   Creatinine, Urine  Date Value Ref Range Status  03/27/2024 60  Final         Passed - Completed PHQ-2 or PHQ-9 in the last 360 days      Passed - Valid encounter within last 12 months    Recent Outpatient Visits           3 weeks ago Bilateral chronic knee pain   [place] [facility] Glenard Mire, MD   3 months ago Hospital discharge follow-up   [facility] Glenard Mire, MD   5 months ago MDD (major depressive disorder), recurrent episode, moderate [facility])   [facility] Glenard Mire, MD   10 months ago Pre-op evaluation   [facility], Mire, MD               ARIPiprazole  (ABILIFY ) 2 MG tablet [Pharmacy Med Name: Aripiprazole  2mg  Tablet] 30 tablet 2    Sig: Take 1 tablet by mouth every evening.     Not Delegated - Psychiatry:  Antipsychotics - Second Generation (Atypical) - aripiprazole  Failed - 09/05/2024 11:36 AM      Failed - This refill cannot be delegated      Failed - Lipid Panel in normal range within the last 12 months    Cholesterol, Total  Date Value Ref Range Status  05/13/2020 209 (H) 100 - 199 mg/dL Final   Cholesterol  Date Value Ref Range Status  06/06/2022 141 0 - 200 Final   LDL Cholesterol (Calc)  Date Value Ref Range Status  10/03/2021 64 mg/dL (calc) Final    Comment:    Reference range: <100 . Desirable range <100 mg/dL for primary prevention;   <70 mg/dL for patients with CHD or diabetic patients  with > or = 2 CHD risk factors. SABRA LDL-C is now calculated using the Martin-Hopkins  calculation, which is a validated novel method providing  better accuracy than the Friedewald equation  in the  estimation of LDL-C.  Gladis APPLETHWAITE et al.  SANDREA. 7986;689(80): 2061-2068  (http://education.[facility].com/faq/FAQ164)    LDL Cholesterol  Date Value Ref Range Status  06/06/2022 43  Final   HDL  Date Value Ref Range Status  06/06/2022 39 35 - 70 Final  05/13/2020 38 (L) >39 mg/dL Final   Triglycerides  Date Value Ref Range Status  06/06/2022 295 (A) 40 - 160 Final         Passed - TSH in normal range and within 360 days    TSH  Date Value Ref Range Status  10/23/2023 1.83 0.40 - 4.50 mIU/L Final         Passed - Completed PHQ-2 or PHQ-9 in the last 360 days      Passed - Last BP in normal range    BP Readings from Last 1 Encounters:  08/12/24 136/74         Passed - Last Heart Rate in normal range    Pulse Readings from Last 1 Encounters:  08/12/24 88         Passed - Valid encounter within last 6 months    Recent Outpatient Visits           3 weeks ago Bilateral chronic knee pain   [place] [facility] Glenard Mire, MD   3 months ago Hospital discharge follow-up   [facility] Glenard Mire, MD   5 months ago MDD (major depressive disorder), recurrent episode, moderate ([name])   [place] [facility] [place], Mire, MD   10 months ago Pre-op evaluation   [place] [facility] [place], Mire, MD              Passed - CBC within normal limits and completed in the last 12 months    WBC  Date Value Ref Range Status  06/02/2024 16.4 (H) 3.8 - 10.8 Thousand/uL Final   RBC  Date Value Ref Range Status  06/02/2024 4.30 3.80 - 5.10 Million/uL Final   Hemoglobin  Date Value Ref Range Status  06/02/2024 12.4 11.7 - 15.5 g/dL Final  90/97/7978 88.8 11.1 - 15.9 g/dL Final   HCT  Date Value Ref Range Status  06/02/2024 38.9 35.0 - 45.0 % Final   Hematocrit  Date Value Ref Range Status  05/13/2020 33.7 (L) 34.0 - 46.6 % Final   MCHC  Date Value Ref Range Status  06/02/2024 31.9 (L) 32.0 - 36.0 g/dL Final     Comment:    For adults, a slight decrease in the calculated MCHC value (in the range of 30 to 32 g/dL) is most likely not clinically significant; however, it should be interpreted with caution in correlation with other red cell parameters and the patient's clinical condition.    [facility]  Date Value Ref Range Status  06/02/2024 28.8 27.0 - 33.0 pg Final   MCV  Date Value Ref Range Status  06/02/2024 90.5 80.0 - 100.0 fL Final  05/13/2020 87 79 - 97 fL Final   No results found for: PLTCOUNTKUC, LABPLAT, POCPLA RDW  Date Value Ref Range Status  06/02/2024 13.9 11.0 - 15.0 % Final  05/13/2020 15.0 11.7 - 15.4 % Final         Passed - CMP within normal limits and completed in the last 12 months    Albumin  Date Value Ref Range Status  05/22/2024 3.8 3.5 - 5.0 g/dL Final  98/85/7977 4.4 3.7 - 4.7 g/dL Final  Alkaline Phosphatase  Date Value Ref Range Status  05/22/2024 63 38 - 126 U/L Final   Alkaline phosphatase (APISO)  Date Value Ref Range Status  06/02/2024 90 37 - 153 U/L Final   ALT  Date Value Ref Range Status  06/02/2024 13 6 - 29 U/L Final   AST  Date Value Ref Range Status  06/02/2024 14 10 - 35 U/L Final   BUN  Date Value Ref Range Status  06/02/2024 38 (H) 7 - 25 mg/dL Final  92/82/7974 34 (A) 4 - 21 Final   Calcium   Date Value Ref Range Status  06/02/2024 9.8 8.6 - 10.4 mg/dL Final   CO2  Date Value Ref Range Status  06/02/2024 26 20 - 32 mmol/L Final   Bicarbonate  Date Value Ref Range Status  05/22/2024 23.0 20.0 - 28.0 mmol/L Final   Creat  Date Value Ref Range Status  06/02/2024 1.68 (H) 0.60 - 1.00 mg/dL Final   Creatinine, Urine  Date Value Ref Range Status  03/27/2024 60  Final   Glucose  Date Value Ref Range Status  03/27/2024 128  Final   Glucose, Bld  Date Value Ref Range Status  06/02/2024 264 (H) 65 - 99 mg/dL Final    Comment:    .            Fasting reference interval . For someone without known diabetes,  a glucose value >125 mg/dL indicates that they may have diabetes and this should be confirmed with a follow-up test. .    POC Glucose  Date Value Ref Range Status  05/18/2017 254 (A) 70 - 99 mg/dl Final   Glucose-Capillary  Date Value Ref Range Status  05/31/2024 310 (H) 70 - 99 mg/dL Final    Comment:    Glucose reference range applies only to samples taken after fasting for at least 8 hours.   Potassium  Date Value Ref Range Status  06/02/2024 4.3 3.5 - 5.3 mmol/L Final   Sodium  Date Value Ref Range Status  06/02/2024 137 135 - 146 mmol/L Final  03/27/2024 140 137 - 147 Final   Total Bilirubin  Date Value Ref Range Status  06/02/2024 0.4 0.2 - 1.2 mg/dL Final   Bilirubin Total  Date Value Ref Range Status  09/24/2020 0.3 0.0 - 1.2 mg/dL Final   Bilirubin, Total  Date Value Ref Range Status  06/06/2022 0.4  Final   Bilirubin, Direct  Date Value Ref Range Status  10/03/2021 0.1 0.0 - 0.2 mg/dL Final   Indirect Bilirubin  Date Value Ref Range Status  10/03/2021 0.3 0.2 - 1.2 mg/dL (calc) Final   Protein, ur  Date Value Ref Range Status  05/22/2024 >=300 (A) NEGATIVE mg/dL Final   Total Protein  Date Value Ref Range Status  06/02/2024 6.6 6.1 - 8.1 g/dL Final  98/85/7977 6.9 6.0 - 8.5 g/dL Final   GFR, Est African American  Date Value Ref Range Status  01/28/2021 46 (L) > OR = 60 mL/min/1.48m2 Final   eGFR  Date Value Ref Range Status  06/02/2024 32 (L) > OR = 60 mL/min/1.16m2 Final   GFR, Est Non African American  Date Value Ref Range Status  01/28/2021 40 (L) > OR = 60 mL/min/1.40m2 Final   GFR, Estimated  Date Value Ref Range Status  05/31/2024 29 (L) >60 mL/min Final    Comment:    (NOTE) Calculated using the CKD-EPI Creatinine Equation (2021)

## 2024-09-05 NOTE — Telephone Encounter (Signed)
 Requested medication (s) are due for refill today: Yes  Requested medication (s) are on the active medication list: Yes  Last refill:  Rosuvastatin  filled 09/03/24  Future visit scheduled: Yes  Notes to clinic:  Other meds not delegated.    Requested Prescriptions  Pending Prescriptions Disp Refills   rosuvastatin  (CRESTOR ) 40 MG tablet [Pharmacy Med Name: Rosuvastatin  Calcium  40mg  Tablet] 30 tablet 2    Sig: Take 1 tablet by mouth daily.     Cardiovascular:  Antilipid - Statins 2 Failed - 09/05/2024 11:36 AM      Failed - Cr in normal range and within 360 days    Creat  Date Value Ref Range Status  06/02/2024 1.68 (H) 0.60 - 1.00 mg/dL Final   Creatinine, Urine  Date Value Ref Range Status  03/27/2024 60  Final         Failed - Lipid Panel in normal range within the last 12 months    Cholesterol, Total  Date Value Ref Range Status  05/13/2020 209 (H) 100 - 199 mg/dL Final   Cholesterol  Date Value Ref Range Status  06/06/2022 141 0 - 200 Final   LDL Cholesterol (Calc)  Date Value Ref Range Status  10/03/2021 64 mg/dL (calc) Final    Comment:    Reference range: <100 . Desirable range <100 mg/dL for primary prevention;   <70 mg/dL for patients with CHD or diabetic patients  with > or = 2 CHD risk factors. SABRA LDL-C is now calculated using the Martin-Hopkins  calculation, which is a validated novel method providing  better accuracy than the Friedewald equation in the  estimation of LDL-C.  Gladis APPLETHWAITE et al. SANDREA. 7986;689(80): 2061-2068  (http://education.[facility].com/faq/FAQ164)    LDL Cholesterol  Date Value Ref Range Status  06/06/2022 43  Final   HDL  Date Value Ref Range Status  06/06/2022 39 35 - 70 Final  05/13/2020 38 (L) >39 mg/dL Final   Triglycerides  Date Value Ref Range Status  06/06/2022 295 (A) 40 - 160 Final         Passed - Patient is not pregnant      Passed - Valid encounter within last 12 months    Recent Outpatient  Visits           3 weeks ago Bilateral chronic knee pain   [facility] Glenard Mire, MD   3 months ago Hospital discharge follow-up   [facility] Glenard Mire, MD   5 months ago MDD (major depressive disorder), recurrent episode, moderate [facility])   [place] [facility] Glenard Mire, MD   10 months ago Pre-op evaluation   [facility] [place], Mire, MD               pregabalin  (LYRICA ) 150 MG capsule [Pharmacy Med Name: Pregabalin  150mg  Capsule] 90 capsule 3    Sig: Take 1 capsule by mouth three times daily.     Not Delegated - Neurology:  Anticonvulsants - Controlled - pregabalin  Failed - 09/05/2024 11:36 AM      Failed - This refill cannot be delegated      Failed - Cr in normal range and within 360 days    Creat  Date Value Ref Range Status  06/02/2024 1.68 (H) 0.60 - 1.00 mg/dL Final   Creatinine, Urine  Date Value Ref Range Status  03/27/2024 60  Final         Passed - Completed PHQ-2  or PHQ-9 in the last 360 days      Passed - Valid encounter within last 12 months    Recent Outpatient Visits           3 weeks ago Bilateral chronic knee pain   [place] [facility] Glenard Mire, MD   3 months ago Hospital discharge follow-up   [facility] Glenard Mire, MD   5 months ago MDD (major depressive disorder), recurrent episode, moderate [facility])   [place] [facility] Glenard Mire, MD   10 months ago Pre-op evaluation   [facility], Mire, MD               ARIPiprazole  (ABILIFY ) 2 MG tablet [Pharmacy Med Name: Aripiprazole  2mg  Tablet] 30 tablet 2    Sig: Take 1 tablet by mouth every evening.     Not Delegated - Psychiatry:  Antipsychotics - Second Generation (Atypical) - aripiprazole  Failed - 09/05/2024 11:36 AM      Failed - This refill cannot be  delegated      Failed - Lipid Panel in normal range within the last 12 months    Cholesterol, Total  Date Value Ref Range Status  05/13/2020 209 (H) 100 - 199 mg/dL Final   Cholesterol  Date Value Ref Range Status  06/06/2022 141 0 - 200 Final   LDL Cholesterol (Calc)  Date Value Ref Range Status  10/03/2021 64 mg/dL (calc) Final    Comment:    Reference range: <100 . Desirable range <100 mg/dL for primary prevention;   <70 mg/dL for patients with CHD or diabetic patients  with > or = 2 CHD risk factors. SABRA LDL-C is now calculated using the Martin-Hopkins  calculation, which is a validated novel method providing  better accuracy than the Friedewald equation in the  estimation of LDL-C.  Gladis APPLETHWAITE et al. SANDREA. 7986;689(80): 2061-2068  (http://education.[facility].com/faq/FAQ164)    LDL Cholesterol  Date Value Ref Range Status  06/06/2022 43  Final   HDL  Date Value Ref Range Status  06/06/2022 39 35 - 70 Final  05/13/2020 38 (L) >39 mg/dL Final   Triglycerides  Date Value Ref Range Status  06/06/2022 295 (A) 40 - 160 Final         Passed - TSH in normal range and within 360 days    TSH  Date Value Ref Range Status  10/23/2023 1.83 0.40 - 4.50 mIU/L Final         Passed - Completed PHQ-2 or PHQ-9 in the last 360 days      Passed - Last BP in normal range    BP Readings from Last 1 Encounters:  08/12/24 136/74         Passed - Last Heart Rate in normal range    Pulse Readings from Last 1 Encounters:  08/12/24 88         Passed - Valid encounter within last 6 months    Recent Outpatient Visits           3 weeks ago Bilateral chronic knee pain   [facility] Glenard Mire, MD   3 months ago Hospital discharge follow-up   [facility] Glenard Mire, MD   5 months ago MDD (major depressive disorder), recurrent episode, moderate [facility])   [facility] Glenard Mire, MD    10 months ago Pre-op evaluation   [place] [facility]  Sowles, Krichna, MD              Passed - CBC within normal limits and completed in the last 12 months    WBC  Date Value Ref Range Status  06/02/2024 16.4 (H) 3.8 - 10.8 Thousand/uL Final   RBC  Date Value Ref Range Status  06/02/2024 4.30 3.80 - 5.10 Million/uL Final   Hemoglobin  Date Value Ref Range Status  06/02/2024 12.4 11.7 - 15.5 g/dL Final  90/97/7978 88.8 11.1 - 15.9 g/dL Final   HCT  Date Value Ref Range Status  06/02/2024 38.9 35.0 - 45.0 % Final   Hematocrit  Date Value Ref Range Status  05/13/2020 33.7 (L) 34.0 - 46.6 % Final   MCHC  Date Value Ref Range Status  06/02/2024 31.9 (L) 32.0 - 36.0 g/dL Final    Comment:    For adults, a slight decrease in the calculated MCHC value (in the range of 30 to 32 g/dL) is most likely not clinically significant; however, it should be interpreted with caution in correlation with other red cell parameters and the patient's clinical condition.    [facility]  Date Value Ref Range Status  06/02/2024 28.8 27.0 - 33.0 pg Final   MCV  Date Value Ref Range Status  06/02/2024 90.5 80.0 - 100.0 fL Final  05/13/2020 87 79 - 97 fL Final   No results found for: PLTCOUNTKUC, LABPLAT, POCPLA RDW  Date Value Ref Range Status  06/02/2024 13.9 11.0 - 15.0 % Final  05/13/2020 15.0 11.7 - 15.4 % Final         Passed - CMP within normal limits and completed in the last 12 months    Albumin  Date Value Ref Range Status  05/22/2024 3.8 3.5 - 5.0 g/dL Final  98/85/7977 4.4 3.7 - 4.7 g/dL Final   Alkaline Phosphatase  Date Value Ref Range Status  05/22/2024 63 38 - 126 U/L Final   Alkaline phosphatase (APISO)  Date Value Ref Range Status  06/02/2024 90 37 - 153 U/L Final   ALT  Date Value Ref Range Status  06/02/2024 13 6 - 29 U/L Final   AST  Date Value Ref Range Status  06/02/2024 14 10 - 35 U/L Final   BUN  Date Value Ref Range  Status  06/02/2024 38 (H) 7 - 25 mg/dL Final  92/82/7974 34 (A) 4 - 21 Final   Calcium   Date Value Ref Range Status  06/02/2024 9.8 8.6 - 10.4 mg/dL Final   CO2  Date Value Ref Range Status  06/02/2024 26 20 - 32 mmol/L Final   Bicarbonate  Date Value Ref Range Status  05/22/2024 23.0 20.0 - 28.0 mmol/L Final   Creat  Date Value Ref Range Status  06/02/2024 1.68 (H) 0.60 - 1.00 mg/dL Final   Creatinine, Urine  Date Value Ref Range Status  03/27/2024 60  Final   Glucose  Date Value Ref Range Status  03/27/2024 128  Final   Glucose, Bld  Date Value Ref Range Status  06/02/2024 264 (H) 65 - 99 mg/dL Final    Comment:    .            Fasting reference interval . For someone without known diabetes, a glucose value >125 mg/dL indicates that they may have diabetes and this should be confirmed with a follow-up test. .    POC Glucose  Date Value Ref Range Status  05/18/2017 254 (A) 70 - 99 mg/dl Final  Glucose-Capillary  Date Value Ref Range Status  05/31/2024 310 (H) 70 - 99 mg/dL Final    Comment:    Glucose reference range applies only to samples taken after fasting for at least 8 hours.   Potassium  Date Value Ref Range Status  06/02/2024 4.3 3.5 - 5.3 mmol/L Final   Sodium  Date Value Ref Range Status  06/02/2024 137 135 - 146 mmol/L Final  03/27/2024 140 137 - 147 Final   Total Bilirubin  Date Value Ref Range Status  06/02/2024 0.4 0.2 - 1.2 mg/dL Final   Bilirubin Total  Date Value Ref Range Status  09/24/2020 0.3 0.0 - 1.2 mg/dL Final   Bilirubin, Total  Date Value Ref Range Status  06/06/2022 0.4  Final   Bilirubin, Direct  Date Value Ref Range Status  10/03/2021 0.1 0.0 - 0.2 mg/dL Final   Indirect Bilirubin  Date Value Ref Range Status  10/03/2021 0.3 0.2 - 1.2 mg/dL (calc) Final   Protein, ur  Date Value Ref Range Status  05/22/2024 >=300 (A) NEGATIVE mg/dL Final   Total Protein  Date Value Ref Range Status  06/02/2024 6.6 6.1  - 8.1 g/dL Final  98/85/7977 6.9 6.0 - 8.5 g/dL Final   GFR, Est African American  Date Value Ref Range Status  01/28/2021 46 (L) > OR = 60 mL/min/1.57m2 Final   eGFR  Date Value Ref Range Status  06/02/2024 32 (L) > OR = 60 mL/min/1.13m2 Final   GFR, Est Non African American  Date Value Ref Range Status  01/28/2021 40 (L) > OR = 60 mL/min/1.56m2 Final   GFR, Estimated  Date Value Ref Range Status  05/31/2024 29 (L) >60 mL/min Final    Comment:    (NOTE) Calculated using the CKD-EPI Creatinine Equation (2021)          Signed Prescriptions Disp Refills   buPROPion  (WELLBUTRIN  XL) 150 MG 24 hr tablet 30 tablet 2    Sig: Take 1 tablet by mouth daily.     Psychiatry: Antidepressants - bupropion  Failed - 09/05/2024 11:36 AM      Failed - Cr in normal range and within 360 days    Creat  Date Value Ref Range Status  06/02/2024 1.68 (H) 0.60 - 1.00 mg/dL Final   Creatinine, Urine  Date Value Ref Range Status  03/27/2024 60  Final         Passed - AST in normal range and within 360 days    AST  Date Value Ref Range Status  06/02/2024 14 10 - 35 U/L Final         Passed - ALT in normal range and within 360 days    ALT  Date Value Ref Range Status  06/02/2024 13 6 - 29 U/L Final         Passed - Completed PHQ-2 or PHQ-9 in the last 360 days      Passed - Last BP in normal range    BP Readings from Last 1 Encounters:  08/12/24 136/74         Passed - Valid encounter within last 6 months    Recent Outpatient Visits           3 weeks ago Bilateral chronic knee pain   [facility] Glenard Mire, MD   3 months ago Hospital discharge follow-up   [facility], Mire, MD   5 months ago MDD (major depressive disorder), recurrent episode, moderate ([name])  [facility] Glenard Mire, MD   10 months ago Pre-op evaluation   [facility] [place] Sowles, Krichna,  MD

## 2024-09-12 ENCOUNTER — Ambulatory Visit

## 2024-09-12 VITALS — BP 158/82 | Ht 62.0 in | Wt 232.4 lb

## 2024-09-12 DIAGNOSIS — Z0000 Encounter for general adult medical examination without abnormal findings: Secondary | ICD-10-CM | POA: Diagnosis not present

## 2024-09-12 NOTE — Patient Instructions (Addendum)
 Summer [name],  Thank you for taking the time for your Medicare Wellness Visit. I appreciate your continued commitment to your health goals. Please review the care plan we discussed, and feel free to reach out if I can assist you further.  Please note that Annual Wellness Visits do not include a physical exam. Some assessments may be limited, especially if the visit was conducted virtually. If needed, we may recommend an in-person follow-up with your provider.  Ongoing Care Seeing your primary care provider every 3 to 6 months helps us  monitor your health and provide consistent, personalized care.  APPT W/ DR.SOWLES 10/06/24 @ 1:20 PM IN PERSON  Referrals If a referral was made during today's visit and you haven't received any updates within two weeks, please contact the referred provider directly to check on the status.  Recommended Screenings:  Health Maintenance  Topic Date Due   COVID-19 Vaccine (4 - 2025-26 season) 05/12/2024   Eye exam for diabetics  07/22/2024   Osteoporosis screening with Bone Density Scan  08/12/2024   Hemoglobin A1C  09/27/2024   Yearly kidney health urinalysis for diabetes  03/27/2025   Complete foot exam   03/27/2025   Yearly kidney function blood test for diabetes  06/02/2025   Medicare Annual Wellness Visit  09/12/2025   Colon Cancer Screening  08/18/2029   DTaP/Tdap/Td vaccine (4 - Td or Tdap) 04/11/2032   Pneumococcal Vaccine for age over 63  Completed   Flu Shot  Completed   Hepatitis C Screening  Completed   Zoster (Shingles) Vaccine  Completed   Meningitis B Vaccine  Aged Out   Breast Cancer Screening  Discontinued       09/12/2024    3:04 PM  Advanced Directives  Does Patient Have a Medical Advance Directive? Yes  Type of Estate Agent of [place];Living will  Does patient want to make changes to medical advance directive? No - Patient declined  Copy of Healthcare Power of Attorney in Chart? Yes - validated most recent copy  scanned in chart (See row information)    Vision: Annual vision screenings are recommended for early detection of glaucoma, cataracts, and diabetic retinopathy. These exams can also reveal signs of chronic conditions such as diabetes and high blood pressure.  Dental: Annual dental screenings help detect early signs of oral cancer, gum disease, and other conditions linked to overall health, including heart disease and diabetes.  Please see the attached documents for additional preventive care recommendations.    NEXT AWV 09/17/25 @ 3:00 PM IN PERSON

## 2024-09-12 NOTE — Progress Notes (Signed)
 "  No chief complaint on file.    Subjective:   Summer [name] is a 74 y.o. female who presents for a Medicare Annual Wellness Visit.  Fall Screening Falls in the past year?: 0 Number of falls in past year: 0 Was there an injury with Fall?: 0 Fall Risk Category Calculator: 0 Patient Fall Risk Level: Low Fall Risk  Fall Risk Patient at Risk for Falls Due to: No Fall Risks Fall risk Follow up: Falls evaluation completed  Advance Directives (For Healthcare) Does Patient Have a Medical Advance Directive?: Yes Does patient want to make changes to medical advance directive?: No - Patient declined Type of Advance Directive: Healthcare Power of Attorney    Allergies (verified) Codeine, Atorvastatin, Hydrocodone , Tramadol , Latex, and Zolpidem    Current Medications (verified) Outpatient Encounter Medications as of 09/12/2024  Medication Sig   acetaminophen  (TYLENOL ) 500 MG tablet Take 2 tablets (1,000 mg total) by mouth every 8 (eight) hours.   ALBUTEROL  IN Inhale 2 puffs into the lungs as needed.   amLODipine  (NORVASC ) 5 MG tablet Take 1 tablet (5 mg total) by mouth daily.   ARIPiprazole  (ABILIFY ) 2 MG tablet Take 1 tablet by mouth every evening.   ASPIRIN  LOW DOSE 81 MG EC tablet TAKE 1 TABLET (81 MG TOTAL) BY MOUTH DAILY.   B-D ULTRAFINE III SHORT PEN 31G X 8 MM MISC    bisacodyl  (DULCOLAX) 5 MG EC tablet Take 2 tablets (10 mg total) by mouth at bedtime.   Boswellia-Glucosamine-Vit D (OSTEO BI-FLEX ONE PER DAY PO) Take 1 capsule by mouth every morning.   buPROPion  (WELLBUTRIN  XL) 150 MG 24 hr tablet Take 1 tablet by mouth daily.   chlorhexidine  (HIBICLENS ) 4 % external liquid Apply 15 mLs (1 Application total) topically as directed for 30 doses. Use as directed daily for 5 days every other week for 6 weeks.   Cholecalciferol  (VITAMIN D3) 25 MCG (1000 UT) CAPS Take 1,000 Units by mouth daily. (Patient taking differently: Take 2,000 Units by mouth 2 (two) times daily.)   Continuous Blood  Gluc Sensor (FREESTYLE LIBRE 2 SENSOR) MISC FOR GLUCOSE MONITORING   docusate sodium  (COLACE) 100 MG capsule Take 1 capsule (100 mg total) by mouth 2 (two) times daily.   enoxaparin  (LOVENOX ) 40 MG/0.4ML injection Inject 0.4 mLs (40 mg total) into the skin daily for 14 days.   ezetimibe  (ZETIA ) 10 MG tablet Take 1 tablet (10 mg total) by mouth daily.   FARXIGA  10 MG TABS tablet Take 1 tablet (10 mg total) by mouth every morning.   fluticasone -salmeterol (ADVAIR  HFA) 230-21 MCG/ACT inhaler Inhale 2 puffs into the lungs 2 (two) times daily.   furosemide  (LASIX ) 20 MG tablet Take 1 tablet (20 mg total) by mouth daily.   insulin  aspart (NOVOLOG  FLEXPEN) 100 UNIT/ML FlexPen Inject 14 Units into the skin in the morning and at bedtime. (Patient taking differently: Inject 14 Units into the skin daily.)   Lactobacillus-Inulin (CULTURELLE DIGESTIVE DAILY PO) Take 15 Billion Cells by mouth daily.   lidocaine  (LIDODERM ) 5 % Place 1 patch onto the skin daily. Remove & Discard patch within 12 hours or as directed by MD   metoprolol  tartrate (LOPRESSOR ) 25 MG tablet Take 0.5 tablets (12.5 mg total) by mouth 2 (two) times daily.   Misc Natural Products (OSTEO BI-FLEX ADV JOINT SHIELD PO) Take by mouth 2 (two) times daily. (Patient taking differently: Take by mouth daily.)   omeprazole  (PRILOSEC) 40 MG capsule Take 1 capsule (40 mg total) by  mouth daily.   ondansetron  (ZOFRAN ) 4 MG tablet Take 1 tablet (4 mg total) by mouth every 6 (six) hours as needed for nausea.   OZEMPIC , 1 MG/DOSE, 4 MG/3ML SOPN Inject 1 mg into the skin once a week.   predniSONE  (DELTASONE ) 10 MG tablet Take 1 tablet (10 mg total) by mouth 2 (two) times daily with a meal.   pregabalin  (LYRICA ) 150 MG capsule Take 1 capsule by mouth three times daily.   psyllium (METAMUCIL) 58.6 % powder Take 1 packet by mouth daily.   rosuvastatin  (CRESTOR ) 40 MG tablet Take 1 tablet (40 mg total) by mouth daily.   sertraline  (ZOLOFT ) 100 MG tablet Take 1.5  tablets (150 mg total) by mouth daily.   traZODone  (DESYREL ) 100 MG tablet Take 1 tablet (100 mg total) by mouth at bedtime as needed. for sleep (Patient taking differently: Take 150 mg by mouth at bedtime. for sleep)   TRESIBA FLEXTOUCH 200 UNIT/ML FlexTouch Pen 85 Units daily. In morning   VASCEPA  1 g capsule Take 2 capsules (2 g total) by mouth 2 (two) times daily.   No facility-administered encounter medications on file as of 09/12/2024.    History: Past Medical History:  Diagnosis Date   (HFpEF) heart failure with preserved ejection fraction ([name])    Anemia    Aortic atherosclerosis    Arthritis    Asthma    B12 deficiency    Back pain    CAD (coronary artery disease)    Carpal tunnel syndrome    Cerebral microvascular disease    Charcot foot due to diabetes mellitus ([name])    CKD (chronic kidney disease), stage IV ([name])    Complication of anesthesia    a.) early emergence   Constipation    COPD (chronic obstructive pulmonary disease) ([name])    Depression    Diabetic peripheral neuropathy ([name])    Dyspnea    GERD (gastroesophageal reflux disease)    Headache    Hiatal hernia    History of kidney stones    Hyperlipidemia    Hypertension    Hypothyroidism    IBS (irritable bowel syndrome)    Insomnia    a.) takes trazodone  PRN   Joint pain    Long-term use of aspirin  therapy    Memory loss    Obesity    OSA (obstructive sleep apnea)    a.) does not utilize nocturnal PAP therapy   PAD (peripheral artery disease)    Pneumonia    Posterior reversible encephalopathy syndrome    Rhinitis, allergic    Rosacea    SOB (shortness of breath)    T2DM (type 2 diabetes mellitus) ([name])    Unspecified hearing loss    Vitamin D  deficiency    Past Surgical History:  Procedure Laterality Date   ABDOMINAL HYSTERECTOMY  1975   AMPUTATION TOE Left 05/02/2023   Procedure: LEFT FOOT PARTIAL AMPUTATION 1st & 2nd TOE;  Surgeon: Silva Juliene SAUNDERS, DPM;  Location: [facility] ORS;  Service:  Orthopedics/Podiatry;  Laterality: Left;   ANKLE SURGERY Left approx Jan 2018   APPENDECTOMY  1970   CATARACT EXTRACTION Bilateral 01/2011   right   COLONOSCOPY WITH PROPOFOL  N/A 08/19/2019   Procedure: COLONOSCOPY WITH PROPOFOL ;  Surgeon: Therisa Bi, MD;  Location: [facility] ENDOSCOPY;  Service: Gastroenterology;  Laterality: N/A;   eye lid surgery  2013   bilateral   FOOT SURGERY     LOWER EXTREMITY ANGIOGRAPHY Left 11/23/2020   Procedure: LOWER EXTREMITY ANGIOGRAPHY;  Surgeon: Jama Cordella MATSU, MD;  Location: [facility] INVASIVE CV LAB;  Service: Cardiovascular;  Laterality: Left;   NECK SURGERY     SPINE SURGERY     TEE WITHOUT CARDIOVERSION N/A 08/16/2022   Procedure: TRANSESOPHAGEAL ECHOCARDIOGRAM (TEE);  Surgeon: Darliss Rogue, MD;  Location: [facility] ORS;  Service: Cardiovascular;  Laterality: N/A;   TOTAL KNEE ARTHROPLASTY Left 05/19/2024   Procedure: ARTHROPLASTY, KNEE, TOTAL;  Surgeon: Lorelle Hussar, MD;  Location: [facility] ORS;  Service: Orthopedics;  Laterality: Left;   TUBAL LIGATION     VAGINAL HYSTERECTOMY  1989   Family History  Problem Relation Age of Onset   Aneurysm Mother    Aortic aneurysm Mother    Hypertension Mother    Hyperlipidemia Mother    Heart disease Mother    Obesity Mother    Heart attack Maternal Grandfather    Diabetes Maternal Grandfather    Social History   Occupational History   Occupation: retired  Tobacco Use   Smoking status: Never   Smokeless tobacco: Never  Vaping Use   Vaping status: Never Used  Substance and Sexual Activity   Alcohol use: No    Alcohol/week: 0.0 standard drinks of alcohol   Drug use: No   Sexual activity: Yes    Partners: Male   Tobacco Counseling Counseling given: Not Answered  SDOH Screenings   Food Insecurity: No Food Insecurity (06/03/2024)   Received from [facility]  Housing: Low Risk  (06/03/2024)   Received from [facility]  Transportation Needs: No Transportation  Needs (06/03/2024)   Received from [facility]  Utilities: Not At Risk (06/03/2024)   Received from [facility]  Depression [id number]): Low Risk (08/12/2024)  Financial Resource Strain: Low Risk  (06/03/2024)   Received from [facility]  Social Connections: Moderately Isolated (05/23/2024)  Tobacco Use: Low Risk (08/12/2024)   See flowsheets for full screening details  Depression Screen PHQ 2 & 9 Depression Scale- Over the past 2 weeks, how often have you been bothered by any of the following problems? Little interest or pleasure in doing things: 0 Feeling down, depressed, or hopeless (PHQ Adolescent also includes...irritable): 0 PHQ-2 Total Score: 0 Trouble falling or staying asleep, or sleeping too much: 0 Feeling tired or having little energy: 0 Poor appetite or overeating (PHQ Adolescent also includes...weight loss): 0 Feeling bad about yourself - or that you are a failure or have let yourself or your family down: 0 Trouble concentrating on things, such as reading the newspaper or watching television (PHQ Adolescent also includes...like school work): 0 Moving or speaking so slowly that other people could have noticed. Or the opposite - being so fidgety or restless that you have been moving around a lot more than usual: 0 Thoughts that you would be better off dead, or of hurting yourself in some way: 0 PHQ-9 Total Score: 0 If you checked off any problems, how difficult have these problems made it for you to do your work, take care of things at home, or get along with other people?: Not difficult at all  Depression Treatment Depression Interventions/Treatment : Medication     Goals Addressed   None          Objective:    There were no vitals filed for this visit. There is no height or weight on file to calculate BMI.  Hearing/Vision screen No results found. Immunizations and Health Maintenance Health Maintenance  Topic  Date Due  Medicare Annual Wellness (AWV)  08/30/2022   COVID-19 Vaccine (4 - 2025-26 season) 05/12/2024   OPHTHALMOLOGY EXAM  07/22/2024   HEMOGLOBIN A1C  09/27/2024   Diabetic kidney evaluation - Urine ACR  03/27/2025   FOOT EXAM  03/27/2025   Diabetic kidney evaluation - eGFR measurement  06/02/2025   Colonoscopy  08/18/2029   DTaP/Tdap/Td (4 - Td or Tdap) 04/11/2032   Pneumococcal Vaccine: 50+ Years  Completed   Influenza Vaccine  Completed   Bone Density Scan  Completed   Hepatitis C Screening  Completed   Zoster Vaccines- Shingrix   Completed   Meningococcal B Vaccine  Aged Out   Mammogram  Discontinued        Assessment/Plan:  This is a routine wellness examination for Summer [name].  Patient Care Team: Sowles, Krichna, MD as PCP - General (Family Medicine) End, Lonni, MD as PCP - Cardiology (Cardiology) End, Lonni, MD as Consulting Physician (Cardiology) Cherilyn Debby CROME, MD as Consulting Physician (Endocrinology) Avanell Katz, MD as Referring Physician (Physical Medicine and Rehabilitation) Lateef, Munsoor, MD (Nephrology) Janit Thresa HERO, DPM as Consulting Physician (Podiatry) Schnier, Cordella MATSU, MD (Vascular Surgery)  I have personally reviewed and noted the following in the patients chart:   Medical and social history Use of alcohol, tobacco or illicit drugs  Current medications and supplements including opioid prescriptions. Functional ability and status Nutritional status Physical activity Advanced directives List of other physicians Hospitalizations, surgeries, and ER visits in previous 12 months Vitals Screenings to include cognitive, depression, and falls Referrals and appointments  No orders of the defined types were placed in this encounter.  In addition, I have reviewed and discussed with patient certain preventive protocols, quality metrics, and best practice recommendations. A written personalized care plan for preventive services as  well as general preventive health recommendations were provided to patient.   Jhonnie GORMAN Das, LPN   04/17/7972   No follow-ups on file.  After Visit Summary: (In Person-Printed) AVS printed and given to the patient  Nurse Notes: UTD ON SHOTS; AGED OUT OF COLONOSCOPY & MAMMOGRAM; DECLINES BDS REFERRAL  "

## 2024-10-03 ENCOUNTER — Other Ambulatory Visit: Payer: Self-pay | Admitting: Family Medicine

## 2024-10-03 DIAGNOSIS — I739 Peripheral vascular disease, unspecified: Secondary | ICD-10-CM

## 2024-10-03 DIAGNOSIS — F5105 Insomnia due to other mental disorder: Secondary | ICD-10-CM

## 2024-10-03 DIAGNOSIS — E1169 Type 2 diabetes mellitus with other specified complication: Secondary | ICD-10-CM

## 2024-10-03 NOTE — Telephone Encounter (Signed)
 Requested medications are due for refill today.  yes  Requested medications are on the active medications list.  Yes  Last refill. 04/04/2024 90 day supply  Future visit scheduled.   yes  Notes to clinic.  Vacepa has no protocol. Per med list pt is taking 150mg   - more than the prescribed 100mg . Please review for refill.    Requested Prescriptions  Pending Prescriptions Disp Refills   traZODone  (DESYREL ) 100 MG tablet [Pharmacy Med Name: Trazodone  Hydrochloride 100mg  Tablet] 30 tablet 3    Sig: Take 1 tablet by mouth at bedtime as needed for sleep.     Psychiatry: Antidepressants - Serotonin Modulator Passed - 10/03/2024  1:12 PM      Passed - Completed PHQ-2 or PHQ-9 in the last 360 days      Passed - Valid encounter within last 6 months    Recent Outpatient Visits           1 month ago Bilateral chronic knee pain   [place] [facility] Glenard Mire, MD   4 months ago Hospital discharge follow-up   [facility] Glenard Mire, MD   6 months ago MDD (major depressive disorder), recurrent episode, moderate [facility])   [place] [facility] Glenard Mire, MD   11 months ago Pre-op evaluation   [place] [facility] Lantana, Mire, MD               VASCEPA  1 g capsule [Pharmacy Med Name: Vascepa  1g Capsule] 120 capsule 3    Sig: Take 2 capsules by mouth twice daily.     Off-Protocol Failed - 10/03/2024  1:12 PM      Failed - Medication not assigned to a protocol, review manually.      Passed - Valid encounter within last 12 months    Recent Outpatient Visits           1 month ago Bilateral chronic knee pain   [place] [facility] Glenard Mire, MD   4 months ago Hospital discharge follow-up   [facility] Glenard Mire, MD   6 months ago MDD (major depressive disorder), recurrent episode, moderate [facility])   [place] [facility] Sowles, Krichna, MD   11 months ago Pre-op evaluation   [facility] Sowles, Krichna, MD

## 2024-10-06 ENCOUNTER — Ambulatory Visit: Admitting: Family Medicine

## 2024-10-13 ENCOUNTER — Ambulatory Visit: Payer: Self-pay | Admitting: Internal Medicine

## 2024-10-14 ENCOUNTER — Ambulatory Visit: Payer: Self-pay | Admitting: Family Medicine

## 2024-10-14 ENCOUNTER — Other Ambulatory Visit (HOSPITAL_COMMUNITY): Payer: Self-pay

## 2024-10-14 ENCOUNTER — Encounter: Payer: Self-pay | Admitting: Family Medicine

## 2024-10-14 VITALS — BP 130/76 | HR 95 | Resp 16 | Ht 62.0 in | Wt 227.6 lb

## 2024-10-14 DIAGNOSIS — Z6841 Body Mass Index (BMI) 40.0 and over, adult: Secondary | ICD-10-CM | POA: Diagnosis not present

## 2024-10-14 DIAGNOSIS — N1832 Chronic kidney disease, stage 3b: Secondary | ICD-10-CM | POA: Diagnosis not present

## 2024-10-14 DIAGNOSIS — E1122 Type 2 diabetes mellitus with diabetic chronic kidney disease: Secondary | ICD-10-CM | POA: Diagnosis not present

## 2024-10-14 DIAGNOSIS — I739 Peripheral vascular disease, unspecified: Secondary | ICD-10-CM

## 2024-10-14 DIAGNOSIS — Z7985 Long-term (current) use of injectable non-insulin antidiabetic drugs: Secondary | ICD-10-CM

## 2024-10-14 DIAGNOSIS — F331 Major depressive disorder, recurrent, moderate: Secondary | ICD-10-CM | POA: Diagnosis not present

## 2024-10-14 DIAGNOSIS — E11610 Type 2 diabetes mellitus with diabetic neuropathic arthropathy: Secondary | ICD-10-CM

## 2024-10-14 DIAGNOSIS — E039 Hypothyroidism, unspecified: Secondary | ICD-10-CM

## 2024-10-14 DIAGNOSIS — Z794 Long term (current) use of insulin: Secondary | ICD-10-CM | POA: Diagnosis not present

## 2024-10-14 DIAGNOSIS — I130 Hypertensive heart and chronic kidney disease with heart failure and stage 1 through stage 4 chronic kidney disease, or unspecified chronic kidney disease: Secondary | ICD-10-CM | POA: Diagnosis not present

## 2024-10-14 DIAGNOSIS — I5032 Chronic diastolic (congestive) heart failure: Secondary | ICD-10-CM | POA: Diagnosis not present

## 2024-10-14 DIAGNOSIS — K219 Gastro-esophageal reflux disease without esophagitis: Secondary | ICD-10-CM

## 2024-10-14 DIAGNOSIS — E1169 Type 2 diabetes mellitus with other specified complication: Secondary | ICD-10-CM | POA: Diagnosis not present

## 2024-10-14 DIAGNOSIS — S98132A Complete traumatic amputation of one left lesser toe, initial encounter: Secondary | ICD-10-CM

## 2024-10-14 DIAGNOSIS — E785 Hyperlipidemia, unspecified: Secondary | ICD-10-CM | POA: Diagnosis not present

## 2024-10-14 MED ORDER — AMLODIPINE BESYLATE 5 MG PO TABS: 5.0000 mg | ORAL_TABLET | Freq: Every day | ORAL | 5 refills | Status: AC

## 2024-10-14 MED ORDER — EZETIMIBE 10 MG PO TABS: 10.0000 mg | ORAL_TABLET | Freq: Every day | ORAL | 5 refills | Status: AC

## 2024-10-14 MED ORDER — FUROSEMIDE 20 MG PO TABS: 20.0000 mg | ORAL_TABLET | Freq: Every day | ORAL | 5 refills | Status: AC

## 2024-10-14 MED ORDER — CARIPRAZINE HCL 1.5 MG PO CAPS: 1.5000 mg | ORAL_CAPSULE | Freq: Every day | ORAL | 5 refills | Status: AC

## 2024-10-14 MED ORDER — OMEPRAZOLE 40 MG PO CPDR: 40.0000 mg | DELAYED_RELEASE_CAPSULE | Freq: Every day | ORAL | 5 refills | Status: AC

## 2024-10-14 MED ORDER — FARXIGA 10 MG PO TABS: 10.0000 mg | ORAL_TABLET | Freq: Every morning | ORAL | 5 refills | Status: AC

## 2024-10-14 MED ORDER — CARIPRAZINE HCL 1.5 MG PO CAPS: 1.5000 mg | ORAL_CAPSULE | Freq: Every day | ORAL | 0 refills | Status: DC

## 2024-10-14 MED ORDER — VASCEPA 1 G PO CAPS: 2.0000 g | ORAL_CAPSULE | Freq: Two times a day (BID) | ORAL | 5 refills | Status: AC

## 2024-10-14 MED ORDER — PREGABALIN 150 MG PO CAPS: 150.0000 mg | ORAL_CAPSULE | Freq: Three times a day (TID) | ORAL | 5 refills | Status: AC

## 2024-10-14 MED ORDER — BUPROPION HCL ER (XL) 150 MG PO TB24: 150.0000 mg | ORAL_TABLET | Freq: Every day | ORAL | 5 refills | Status: AC

## 2024-10-14 MED ORDER — SERTRALINE HCL 100 MG PO TABS: 150.0000 mg | ORAL_TABLET | Freq: Every day | ORAL | 5 refills | Status: AC

## 2024-10-15 ENCOUNTER — Ambulatory Visit: Payer: Self-pay | Admitting: Family Medicine

## 2024-10-15 LAB — LIPID PANEL
Cholesterol: 132 mg/dL
HDL: 54 mg/dL
LDL Cholesterol (Calc): 52 mg/dL
Non-HDL Cholesterol (Calc): 78 mg/dL
Total CHOL/HDL Ratio: 2.4 (calc)
Triglycerides: 179 mg/dL — ABNORMAL HIGH

## 2024-10-15 LAB — COMPREHENSIVE METABOLIC PANEL WITH GFR
AG Ratio: 2.2 (calc) (ref 1.0–2.5)
ALT: 17 U/L (ref 6–29)
AST: 18 U/L (ref 10–35)
Albumin: 4.8 g/dL (ref 3.6–5.1)
Alkaline phosphatase (APISO): 67 U/L (ref 37–153)
BUN/Creatinine Ratio: 22 (calc) (ref 6–22)
BUN: 36 mg/dL — ABNORMAL HIGH (ref 7–25)
CO2: 28 mmol/L (ref 20–32)
Calcium: 10.1 mg/dL (ref 8.6–10.4)
Chloride: 103 mmol/L (ref 98–110)
Creat: 1.61 mg/dL — ABNORMAL HIGH (ref 0.60–1.00)
Globulin: 2.2 g/dL (ref 1.9–3.7)
Glucose, Bld: 161 mg/dL — ABNORMAL HIGH (ref 65–99)
Potassium: 4.7 mmol/L (ref 3.5–5.3)
Sodium: 141 mmol/L (ref 135–146)
Total Bilirubin: 0.4 mg/dL (ref 0.2–1.2)
Total Protein: 7 g/dL (ref 6.1–8.1)
eGFR: 33 mL/min/{1.73_m2} — ABNORMAL LOW

## 2024-10-15 LAB — TSH: TSH: 1.45 m[IU]/L (ref 0.40–4.50)

## 2024-10-17 ENCOUNTER — Ambulatory Visit: Payer: Self-pay

## 2024-10-17 NOTE — Telephone Encounter (Signed)
 2nd attempt to contact patient. No answer. Unable to leave message, voicemail full. Call placed in call back for additional attempts to contact.   Copied from CRM [id number]. Topic: Clinical - Prescription Issue >> Oct 17, 2024 10:57 AM Summer [name] wrote: Reason for CRM: Pt called in regarding medication cariprazine  (VRAYLAR ) 1.5 MG capsule  Pt states insurance does not want  to cover if something else can be sent in.   New prescription for rosuvastatin  (CRESTOR ) 40 MG tablet - told pt that it needs new one.   Pls follow up with pt.

## 2024-10-17 NOTE — Telephone Encounter (Signed)
 Attempted to call pt x3. VM left for pt. Routing to clinic for follow up.    Copied from CRM [id number]. Topic: Clinical - Prescription Issue >> Oct 17, 2024 10:57 AM Ivette P wrote: Reason for CRM: Pt called in regarding medication cariprazine  (VRAYLAR ) 1.5 MG capsule   Pt states insurance does not want  to cover if something else can be sent in.    New prescription for rosuvastatin  (CRESTOR ) 40 MG tablet - told pt that it needs new one.    Pls follow up with pt.

## 2024-10-17 NOTE — Telephone Encounter (Signed)
 1st attempt to contact patient. No answer. Unable to leave message, voicemail full. Call placed in call back for additional attempts to contact.   Copied from CRM [id number]. Topic: Clinical - Prescription Issue >> Oct 17, 2024 10:57 AM Ivette P wrote: Reason for CRM: Pt called in regarding medication cariprazine  (VRAYLAR ) 1.5 MG capsule  Pt states insurance does not want  to cover if something else can be sent in.   New prescription for rosuvastatin  (CRESTOR ) 40 MG tablet - told pt that it needs new one.   Pls follow up with pt.

## 2024-10-21 ENCOUNTER — Ambulatory Visit: Payer: Self-pay | Admitting: Internal Medicine

## 2025-01-14 ENCOUNTER — Ambulatory Visit: Admitting: Family Medicine

## 2025-09-17 ENCOUNTER — Ambulatory Visit

## 2025-10-05 ENCOUNTER — Ambulatory Visit: Admitting: Family Medicine
